# Patient Record
Sex: Female | Born: 1965 | Race: Black or African American | Hispanic: No | Smoking: Light tobacco smoker
Health system: Southern US, Community
[De-identification: ages and names within clinical notes are randomized; demographics above are authoritative.]

## PROBLEM LIST (undated history)

## (undated) ENCOUNTER — Emergency Department (HOSPITAL_COMMUNITY): Admission: EM | Payer: Medicaid Other | Source: Home / Self Care

## (undated) DIAGNOSIS — R51 Headache: Secondary | ICD-10-CM

## (undated) DIAGNOSIS — R87619 Unspecified abnormal cytological findings in specimens from cervix uteri: Secondary | ICD-10-CM

## (undated) DIAGNOSIS — M722 Plantar fascial fibromatosis: Secondary | ICD-10-CM

## (undated) DIAGNOSIS — C73 Malignant neoplasm of thyroid gland: Secondary | ICD-10-CM

## (undated) DIAGNOSIS — D219 Benign neoplasm of connective and other soft tissue, unspecified: Secondary | ICD-10-CM

## (undated) DIAGNOSIS — N390 Urinary tract infection, site not specified: Secondary | ICD-10-CM

## (undated) DIAGNOSIS — J40 Bronchitis, not specified as acute or chronic: Secondary | ICD-10-CM

## (undated) DIAGNOSIS — A599 Trichomoniasis, unspecified: Secondary | ICD-10-CM

## (undated) DIAGNOSIS — R569 Unspecified convulsions: Secondary | ICD-10-CM

## (undated) DIAGNOSIS — IMO0002 Reserved for concepts with insufficient information to code with codable children: Secondary | ICD-10-CM

## (undated) DIAGNOSIS — N83209 Unspecified ovarian cyst, unspecified side: Secondary | ICD-10-CM

## (undated) DIAGNOSIS — I639 Cerebral infarction, unspecified: Secondary | ICD-10-CM

## (undated) HISTORY — DX: Cerebral infarction, unspecified: I63.9

## (undated) HISTORY — PX: THERAPEUTIC ABORTION: SHX798

## (undated) HISTORY — PX: ABDOMINAL HYSTERECTOMY: SHX81

## (undated) HISTORY — PX: OTHER SURGICAL HISTORY: SHX169

---

## 1986-11-18 HISTORY — PX: OTHER SURGICAL HISTORY: SHX169

## 1998-06-03 ENCOUNTER — Emergency Department (HOSPITAL_COMMUNITY): Admission: EM | Admit: 1998-06-03 | Discharge: 1998-06-03 | Payer: Self-pay | Admitting: Emergency Medicine

## 2001-09-30 ENCOUNTER — Emergency Department (HOSPITAL_COMMUNITY): Admission: EM | Admit: 2001-09-30 | Discharge: 2001-09-30 | Payer: Self-pay | Admitting: *Deleted

## 2001-11-14 ENCOUNTER — Emergency Department (HOSPITAL_COMMUNITY): Admission: EM | Admit: 2001-11-14 | Discharge: 2001-11-14 | Payer: Self-pay | Admitting: Emergency Medicine

## 2002-01-09 ENCOUNTER — Encounter: Payer: Self-pay | Admitting: Emergency Medicine

## 2002-01-09 ENCOUNTER — Emergency Department (HOSPITAL_COMMUNITY): Admission: EM | Admit: 2002-01-09 | Discharge: 2002-01-09 | Payer: Self-pay | Admitting: Emergency Medicine

## 2002-04-25 ENCOUNTER — Emergency Department (HOSPITAL_COMMUNITY): Admission: EM | Admit: 2002-04-25 | Discharge: 2002-04-25 | Payer: Self-pay | Admitting: Emergency Medicine

## 2002-04-26 ENCOUNTER — Encounter: Payer: Self-pay | Admitting: Emergency Medicine

## 2002-04-26 ENCOUNTER — Emergency Department (HOSPITAL_COMMUNITY): Admission: EM | Admit: 2002-04-26 | Discharge: 2002-04-26 | Payer: Self-pay | Admitting: Emergency Medicine

## 2002-05-03 ENCOUNTER — Emergency Department (HOSPITAL_COMMUNITY): Admission: EM | Admit: 2002-05-03 | Discharge: 2002-05-03 | Payer: Self-pay | Admitting: Emergency Medicine

## 2002-05-03 ENCOUNTER — Encounter: Payer: Self-pay | Admitting: Emergency Medicine

## 2002-05-03 ENCOUNTER — Encounter: Payer: Self-pay | Admitting: Otolaryngology

## 2002-06-09 ENCOUNTER — Emergency Department (HOSPITAL_COMMUNITY): Admission: EM | Admit: 2002-06-09 | Discharge: 2002-06-10 | Payer: Self-pay

## 2002-07-13 ENCOUNTER — Emergency Department (HOSPITAL_COMMUNITY): Admission: EM | Admit: 2002-07-13 | Discharge: 2002-07-13 | Payer: Self-pay | Admitting: Emergency Medicine

## 2002-07-18 ENCOUNTER — Emergency Department (HOSPITAL_COMMUNITY): Admission: EM | Admit: 2002-07-18 | Discharge: 2002-07-18 | Payer: Self-pay | Admitting: Emergency Medicine

## 2002-07-20 ENCOUNTER — Inpatient Hospital Stay (HOSPITAL_COMMUNITY): Admission: EM | Admit: 2002-07-20 | Discharge: 2002-07-24 | Payer: Self-pay | Admitting: Psychiatry

## 2002-08-17 ENCOUNTER — Emergency Department (HOSPITAL_COMMUNITY): Admission: EM | Admit: 2002-08-17 | Discharge: 2002-08-17 | Payer: Self-pay | Admitting: Emergency Medicine

## 2002-09-25 ENCOUNTER — Emergency Department (HOSPITAL_COMMUNITY): Admission: EM | Admit: 2002-09-25 | Discharge: 2002-09-25 | Payer: Self-pay | Admitting: Emergency Medicine

## 2002-09-25 ENCOUNTER — Encounter: Payer: Self-pay | Admitting: Emergency Medicine

## 2002-11-23 ENCOUNTER — Emergency Department (HOSPITAL_COMMUNITY): Admission: EM | Admit: 2002-11-23 | Discharge: 2002-11-23 | Payer: Self-pay | Admitting: Emergency Medicine

## 2008-09-22 ENCOUNTER — Encounter: Payer: Self-pay | Admitting: Emergency Medicine

## 2008-09-23 ENCOUNTER — Observation Stay (HOSPITAL_COMMUNITY): Admission: AD | Admit: 2008-09-23 | Discharge: 2008-09-24 | Payer: Self-pay | Admitting: Obstetrics and Gynecology

## 2008-12-21 ENCOUNTER — Ambulatory Visit: Payer: Self-pay | Admitting: Obstetrics and Gynecology

## 2008-12-21 LAB — CONVERTED CEMR LAB
HCT: 41.8 % (ref 36.0–46.0)
Hemoglobin: 13.4 g/dL (ref 12.0–15.0)
MCV: 76 fL — ABNORMAL LOW (ref 78.0–100.0)
Platelets: 229 10*3/uL (ref 150–400)
WBC: 6.8 10*3/uL (ref 4.0–10.5)

## 2008-12-22 ENCOUNTER — Encounter: Payer: Self-pay | Admitting: Obstetrics and Gynecology

## 2008-12-22 LAB — CONVERTED CEMR LAB

## 2009-01-04 ENCOUNTER — Ambulatory Visit: Payer: Self-pay | Admitting: Obstetrics & Gynecology

## 2009-01-31 ENCOUNTER — Inpatient Hospital Stay (HOSPITAL_COMMUNITY): Admission: RE | Admit: 2009-01-31 | Discharge: 2009-02-03 | Payer: Self-pay | Admitting: Obstetrics & Gynecology

## 2009-01-31 ENCOUNTER — Ambulatory Visit: Payer: Self-pay | Admitting: Obstetrics & Gynecology

## 2009-01-31 ENCOUNTER — Encounter: Payer: Self-pay | Admitting: Obstetrics & Gynecology

## 2009-02-08 ENCOUNTER — Ambulatory Visit: Payer: Self-pay | Admitting: Obstetrics & Gynecology

## 2009-03-16 ENCOUNTER — Ambulatory Visit: Payer: Self-pay | Admitting: Obstetrics & Gynecology

## 2009-03-17 ENCOUNTER — Encounter: Payer: Self-pay | Admitting: Obstetrics and Gynecology

## 2009-03-17 LAB — CONVERTED CEMR LAB
Clue Cells Wet Prep HPF POC: NONE SEEN
Trich, Wet Prep: NONE SEEN
Yeast Wet Prep HPF POC: NONE SEEN

## 2009-04-26 ENCOUNTER — Emergency Department (HOSPITAL_COMMUNITY): Admission: EM | Admit: 2009-04-26 | Discharge: 2009-04-26 | Payer: Self-pay | Admitting: Emergency Medicine

## 2009-04-27 ENCOUNTER — Inpatient Hospital Stay (HOSPITAL_COMMUNITY): Admission: AD | Admit: 2009-04-27 | Discharge: 2009-04-27 | Payer: Self-pay | Admitting: Obstetrics & Gynecology

## 2009-06-20 ENCOUNTER — Emergency Department (HOSPITAL_COMMUNITY): Admission: EM | Admit: 2009-06-20 | Discharge: 2009-06-20 | Payer: Self-pay | Admitting: Emergency Medicine

## 2009-06-20 ENCOUNTER — Encounter: Admission: RE | Admit: 2009-06-20 | Discharge: 2009-06-20 | Payer: Self-pay | Admitting: General Surgery

## 2009-07-18 ENCOUNTER — Emergency Department (HOSPITAL_COMMUNITY): Admission: EM | Admit: 2009-07-18 | Discharge: 2009-07-18 | Payer: Self-pay | Admitting: Emergency Medicine

## 2009-08-03 ENCOUNTER — Emergency Department (HOSPITAL_COMMUNITY): Admission: EM | Admit: 2009-08-03 | Discharge: 2009-08-03 | Payer: Self-pay | Admitting: Family Medicine

## 2009-08-04 ENCOUNTER — Emergency Department (HOSPITAL_COMMUNITY): Admission: EM | Admit: 2009-08-04 | Discharge: 2009-08-04 | Payer: Self-pay | Admitting: Emergency Medicine

## 2009-08-09 ENCOUNTER — Ambulatory Visit: Payer: Self-pay | Admitting: Internal Medicine

## 2009-10-02 ENCOUNTER — Emergency Department (HOSPITAL_COMMUNITY): Admission: EM | Admit: 2009-10-02 | Discharge: 2009-10-02 | Payer: Self-pay | Admitting: Emergency Medicine

## 2009-10-10 ENCOUNTER — Ambulatory Visit: Payer: Self-pay | Admitting: Internal Medicine

## 2009-10-22 ENCOUNTER — Emergency Department (HOSPITAL_COMMUNITY): Admission: EM | Admit: 2009-10-22 | Discharge: 2009-10-22 | Payer: Self-pay | Admitting: Emergency Medicine

## 2009-11-15 ENCOUNTER — Encounter (HOSPITAL_COMMUNITY): Admission: RE | Admit: 2009-11-15 | Discharge: 2010-02-13 | Payer: Self-pay | Admitting: Endocrinology

## 2009-12-03 ENCOUNTER — Emergency Department (HOSPITAL_COMMUNITY): Admission: EM | Admit: 2009-12-03 | Discharge: 2009-12-03 | Payer: Self-pay | Admitting: Emergency Medicine

## 2009-12-21 ENCOUNTER — Emergency Department (HOSPITAL_COMMUNITY): Admission: EM | Admit: 2009-12-21 | Discharge: 2009-12-21 | Payer: Self-pay | Admitting: Emergency Medicine

## 2010-01-08 ENCOUNTER — Emergency Department (HOSPITAL_COMMUNITY): Admission: EM | Admit: 2010-01-08 | Discharge: 2010-01-08 | Payer: Self-pay | Admitting: Emergency Medicine

## 2010-01-16 HISTORY — PX: THYROIDECTOMY: SHX17

## 2010-01-27 ENCOUNTER — Inpatient Hospital Stay (HOSPITAL_COMMUNITY): Admission: RE | Admit: 2010-01-27 | Discharge: 2010-01-30 | Payer: Self-pay | Admitting: Psychiatry

## 2010-01-27 ENCOUNTER — Ambulatory Visit: Payer: Self-pay | Admitting: Psychiatry

## 2010-01-27 ENCOUNTER — Emergency Department (HOSPITAL_COMMUNITY): Admission: EM | Admit: 2010-01-27 | Discharge: 2010-01-27 | Payer: Self-pay | Admitting: Emergency Medicine

## 2010-01-31 ENCOUNTER — Encounter (INDEPENDENT_AMBULATORY_CARE_PROVIDER_SITE_OTHER): Payer: Self-pay | Admitting: General Surgery

## 2010-01-31 ENCOUNTER — Observation Stay (HOSPITAL_COMMUNITY): Admission: RE | Admit: 2010-01-31 | Discharge: 2010-02-05 | Payer: Self-pay | Admitting: General Surgery

## 2010-02-05 ENCOUNTER — Inpatient Hospital Stay (HOSPITAL_COMMUNITY): Admission: AD | Admit: 2010-02-05 | Discharge: 2010-02-11 | Payer: Self-pay | Admitting: Psychiatry

## 2010-04-29 ENCOUNTER — Emergency Department (HOSPITAL_COMMUNITY): Admission: EM | Admit: 2010-04-29 | Discharge: 2010-04-29 | Payer: Self-pay | Admitting: Emergency Medicine

## 2010-05-07 ENCOUNTER — Emergency Department (HOSPITAL_COMMUNITY): Admission: EM | Admit: 2010-05-07 | Discharge: 2010-05-07 | Payer: Self-pay | Admitting: Emergency Medicine

## 2010-05-15 ENCOUNTER — Ambulatory Visit: Payer: Self-pay | Admitting: Gynecology

## 2010-05-15 ENCOUNTER — Inpatient Hospital Stay (HOSPITAL_COMMUNITY): Admission: AD | Admit: 2010-05-15 | Discharge: 2010-05-15 | Payer: Self-pay | Admitting: Family Medicine

## 2010-06-14 ENCOUNTER — Inpatient Hospital Stay (HOSPITAL_COMMUNITY): Admission: EM | Admit: 2010-06-14 | Discharge: 2010-06-16 | Payer: Self-pay | Admitting: Emergency Medicine

## 2010-06-14 ENCOUNTER — Ambulatory Visit: Payer: Self-pay | Admitting: Cardiology

## 2010-06-16 ENCOUNTER — Encounter (INDEPENDENT_AMBULATORY_CARE_PROVIDER_SITE_OTHER): Payer: Self-pay | Admitting: Internal Medicine

## 2010-07-02 ENCOUNTER — Ambulatory Visit: Payer: Self-pay | Admitting: Internal Medicine

## 2010-08-06 ENCOUNTER — Emergency Department (HOSPITAL_COMMUNITY): Admission: EM | Admit: 2010-08-06 | Discharge: 2010-08-06 | Payer: Self-pay | Admitting: Emergency Medicine

## 2010-10-30 ENCOUNTER — Emergency Department (HOSPITAL_COMMUNITY)
Admission: EM | Admit: 2010-10-30 | Discharge: 2010-10-31 | Payer: Self-pay | Source: Home / Self Care | Admitting: Emergency Medicine

## 2010-12-09 ENCOUNTER — Encounter: Payer: Self-pay | Admitting: Endocrinology

## 2011-01-28 ENCOUNTER — Emergency Department (HOSPITAL_COMMUNITY)
Admission: EM | Admit: 2011-01-28 | Discharge: 2011-01-28 | Disposition: A | Discharge status: Home / Self Care | Payer: Self-pay | Attending: Emergency Medicine | Admitting: Emergency Medicine

## 2011-01-28 ENCOUNTER — Emergency Department (HOSPITAL_COMMUNITY): Payer: Self-pay

## 2011-01-28 DIAGNOSIS — W268XXA Contact with other sharp object(s), not elsewhere classified, initial encounter: Secondary | ICD-10-CM | POA: Insufficient documentation

## 2011-01-28 DIAGNOSIS — F329 Major depressive disorder, single episode, unspecified: Secondary | ICD-10-CM | POA: Insufficient documentation

## 2011-01-28 DIAGNOSIS — S61409A Unspecified open wound of unspecified hand, initial encounter: Secondary | ICD-10-CM | POA: Insufficient documentation

## 2011-01-28 DIAGNOSIS — E039 Hypothyroidism, unspecified: Secondary | ICD-10-CM | POA: Insufficient documentation

## 2011-01-28 DIAGNOSIS — S61209A Unspecified open wound of unspecified finger without damage to nail, initial encounter: Secondary | ICD-10-CM | POA: Insufficient documentation

## 2011-01-28 DIAGNOSIS — Z79899 Other long term (current) drug therapy: Secondary | ICD-10-CM | POA: Insufficient documentation

## 2011-01-28 DIAGNOSIS — I10 Essential (primary) hypertension: Secondary | ICD-10-CM | POA: Insufficient documentation

## 2011-01-28 DIAGNOSIS — F3289 Other specified depressive episodes: Secondary | ICD-10-CM | POA: Insufficient documentation

## 2011-01-28 LAB — DIFFERENTIAL
Basophils Absolute: 0 10*3/uL (ref 0.0–0.1)
Lymphocytes Relative: 33 % (ref 12–46)
Lymphs Abs: 1.8 10*3/uL (ref 0.7–4.0)
Monocytes Absolute: 0.4 10*3/uL (ref 0.1–1.0)
Monocytes Relative: 7 % (ref 3–12)
Neutro Abs: 3.3 10*3/uL (ref 1.7–7.7)

## 2011-01-28 LAB — URINALYSIS, ROUTINE W REFLEX MICROSCOPIC
Protein, ur: NEGATIVE mg/dL
Urobilinogen, UA: 1 mg/dL (ref 0.0–1.0)

## 2011-01-28 LAB — CBC
MCH: 28 pg (ref 26.0–34.0)
MCHC: 33 g/dL (ref 30.0–36.0)
MCV: 84.7 fL (ref 78.0–100.0)
Platelets: 157 10*3/uL (ref 150–400)
RDW: 12.5 % (ref 11.5–15.5)
WBC: 5.6 10*3/uL (ref 4.0–10.5)

## 2011-01-28 LAB — URINE MICROSCOPIC-ADD ON

## 2011-01-28 LAB — COMPREHENSIVE METABOLIC PANEL
Albumin: 3.7 g/dL (ref 3.5–5.2)
BUN: 9 mg/dL (ref 6–23)
Calcium: 8.4 mg/dL (ref 8.4–10.5)
Creatinine, Ser: 0.61 mg/dL (ref 0.4–1.2)
Total Protein: 6.7 g/dL (ref 6.0–8.3)

## 2011-01-28 LAB — RAPID URINE DRUG SCREEN, HOSP PERFORMED
Amphetamines: NOT DETECTED
Barbiturates: NOT DETECTED
Benzodiazepines: NOT DETECTED
Opiates: NOT DETECTED

## 2011-01-28 LAB — TROPONIN I: Troponin I: <0.01 ng/mL (ref 0.00–0.06)

## 2011-01-28 LAB — APTT: aPTT: 28 seconds (ref 24–37)

## 2011-02-02 LAB — CBC
HCT: 45.2 % (ref 36.0–46.0)
HCT: 45.3 % (ref 36.0–46.0)
Hemoglobin: 15.5 g/dL — ABNORMAL HIGH (ref 12.0–15.0)
MCH: 29.2 pg (ref 26.0–34.0)
MCH: 29.7 pg (ref 26.0–34.0)
MCHC: 33.4 g/dL (ref 30.0–36.0)
MCHC: 34.3 g/dL (ref 30.0–36.0)
MCV: 86.7 fL (ref 78.0–100.0)
Platelets: 144 10*3/uL — ABNORMAL LOW (ref 150–400)
RBC: 5.21 MIL/uL — ABNORMAL HIGH (ref 3.87–5.11)
RDW: 13.2 % (ref 11.5–15.5)
RDW: 13.6 % (ref 11.5–15.5)
WBC: 4.6 K/uL (ref 4.0–10.5)

## 2011-02-02 LAB — URINALYSIS, ROUTINE W REFLEX MICROSCOPIC
Bilirubin Urine: NEGATIVE
Glucose, UA: NEGATIVE mg/dL
Hgb urine dipstick: NEGATIVE
Ketones, ur: NEGATIVE mg/dL
Nitrite: NEGATIVE
Protein, ur: NEGATIVE mg/dL
Specific Gravity, Urine: 1.01 (ref 1.005–1.030)
Urobilinogen, UA: 0.2 mg/dL (ref 0.0–1.0)
pH: 6 (ref 5.0–8.0)

## 2011-02-02 LAB — BASIC METABOLIC PANEL
BUN: 7 mg/dL (ref 6–23)
Calcium: 8.2 mg/dL — ABNORMAL LOW (ref 8.4–10.5)
Calcium: 8.6 mg/dL (ref 8.4–10.5)
Creatinine, Ser: 0.68 mg/dL (ref 0.4–1.2)
GFR calc Af Amer: >60 mL/min (ref >60)
GFR calc non Af Amer: >60 mL/min (ref >60)
GFR calc non Af Amer: >60 mL/min (ref >60)
Glucose, Bld: 84 mg/dL (ref 70–99)
Potassium: 3.9 mEq/L (ref 3.5–5.1)
Sodium: 139 mEq/L (ref 135–145)
Sodium: 139 mEq/L (ref 135–145)

## 2011-02-02 LAB — LIPID PANEL
Cholesterol: 181 mg/dL (ref 0–200)
HDL: 40 mg/dL (ref >39)
Total CHOL/HDL Ratio: 4.5 RATIO
VLDL: 22 mg/dL (ref 0–40)

## 2011-02-02 LAB — COMPREHENSIVE METABOLIC PANEL
Albumin: 3.7 g/dL (ref 3.5–5.2)
BUN: 6 mg/dL (ref 6–23)
Chloride: 105 mEq/L (ref 96–112)
Creatinine, Ser: 0.59 mg/dL (ref 0.4–1.2)
Glucose, Bld: 87 mg/dL (ref 70–99)
Total Bilirubin: 0.6 mg/dL (ref 0.3–1.2)

## 2011-02-02 LAB — BASIC METABOLIC PANEL WITH GFR
CO2: 25 meq/L (ref 19–32)
Chloride: 108 meq/L (ref 96–112)
GFR calc Af Amer: >60 mL/min (ref >60)
Potassium: 3.6 meq/L (ref 3.5–5.1)

## 2011-02-02 LAB — T4, FREE: Free T4: 1.35 ng/dL (ref 0.80–1.80)

## 2011-02-02 LAB — PROTIME-INR
INR: 0.96 (ref 0.00–1.49)
Prothrombin Time: 12.7 seconds (ref 11.6–15.2)

## 2011-02-02 LAB — RAPID URINE DRUG SCREEN, HOSP PERFORMED
Amphetamines: NOT DETECTED
Barbiturates: NOT DETECTED

## 2011-02-02 LAB — DIFFERENTIAL
Basophils Absolute: 0 10*3/uL (ref 0.0–0.1)
Basophils Relative: 0 % (ref 0–1)
Eosinophils Absolute: 0.1 K/uL (ref 0.0–0.7)
Eosinophils Relative: 2 % (ref 0–5)
Lymphocytes Relative: 37 % (ref 12–46)
Lymphs Abs: 1.7 K/uL (ref 0.7–4.0)
Monocytes Absolute: 0.4 K/uL (ref 0.1–1.0)
Monocytes Relative: 8 % (ref 3–12)
Neutro Abs: 2.4 10*3/uL (ref 1.7–7.7)
Neutrophils Relative %: 54 % (ref 43–77)

## 2011-02-02 LAB — TSH
TSH: 0.584 u[IU]/mL (ref 0.350–4.500)
TSH: 1.015 u[IU]/mL (ref 0.350–4.500)

## 2011-02-02 LAB — HEMOGLOBIN A1C
Hgb A1c MFr Bld: 5.6 % (ref <5.7)
Mean Plasma Glucose: 114 mg/dL (ref <117)

## 2011-02-02 LAB — ETHANOL: Alcohol, Ethyl (B): <5 mg/dL (ref 0–10)

## 2011-02-02 LAB — CK TOTAL AND CKMB (NOT AT ARMC)
CK, MB: 0.9 ng/mL (ref 0.3–4.0)
Total CK: 87 U/L (ref 7–177)

## 2011-02-03 LAB — WET PREP, GENITAL
Trich, Wet Prep: NONE SEEN
Yeast Wet Prep HPF POC: NONE SEEN

## 2011-02-03 LAB — URINALYSIS, ROUTINE W REFLEX MICROSCOPIC
Bilirubin Urine: NEGATIVE
Glucose, UA: NEGATIVE mg/dL
Ketones, ur: NEGATIVE mg/dL
Protein, ur: NEGATIVE mg/dL

## 2011-02-04 LAB — URINALYSIS, ROUTINE W REFLEX MICROSCOPIC
Bilirubin Urine: NEGATIVE
Ketones, ur: NEGATIVE mg/dL
Nitrite: NEGATIVE
Protein, ur: NEGATIVE mg/dL
Urobilinogen, UA: 0.2 mg/dL (ref 0.0–1.0)
pH: 6.5 (ref 5.0–8.0)

## 2011-02-04 LAB — POCT PREGNANCY, URINE: Preg Test, Ur: NEGATIVE

## 2011-02-05 ENCOUNTER — Inpatient Hospital Stay (HOSPITAL_COMMUNITY)
Admission: AD | Admit: 2011-02-05 | Discharge: 2011-02-05 | Disposition: A | Discharge status: Home / Self Care | Payer: Self-pay | Source: Ambulatory Visit | Attending: Obstetrics and Gynecology | Admitting: Obstetrics and Gynecology

## 2011-02-05 DIAGNOSIS — M549 Dorsalgia, unspecified: Secondary | ICD-10-CM | POA: Insufficient documentation

## 2011-02-05 DIAGNOSIS — A5901 Trichomonal vulvovaginitis: Secondary | ICD-10-CM | POA: Insufficient documentation

## 2011-02-05 DIAGNOSIS — N644 Mastodynia: Secondary | ICD-10-CM | POA: Insufficient documentation

## 2011-02-05 LAB — URINALYSIS, ROUTINE W REFLEX MICROSCOPIC
Bilirubin Urine: NEGATIVE
Ketones, ur: NEGATIVE mg/dL
Nitrite: NEGATIVE
Protein, ur: NEGATIVE mg/dL
Urobilinogen, UA: 0.2 mg/dL (ref 0.0–1.0)

## 2011-02-05 LAB — URINE MICROSCOPIC-ADD ON

## 2011-02-10 LAB — HEMOCCULT GUIAC POC 1CARD (OFFICE): Fecal Occult Bld: POSITIVE

## 2011-02-10 LAB — CALCIUM: Calcium: 7.6 mg/dL — ABNORMAL LOW (ref 8.4–10.5)

## 2011-02-11 ENCOUNTER — Other Ambulatory Visit: Payer: Self-pay

## 2011-02-11 LAB — CBC
HCT: 46.8 % — ABNORMAL HIGH (ref 36.0–46.0)
Hemoglobin: 15.1 g/dL — ABNORMAL HIGH (ref 12.0–15.0)
MCHC: 32.4 g/dL (ref 30.0–36.0)
MCV: 89 fL (ref 78.0–100.0)
Platelets: 182 10*3/uL (ref 150–400)
RDW: 13.9 % (ref 11.5–15.5)
RDW: 14.1 % (ref 11.5–15.5)
WBC: 5.4 10*3/uL (ref 4.0–10.5)

## 2011-02-11 LAB — RAPID URINE DRUG SCREEN, HOSP PERFORMED
Amphetamines: NOT DETECTED
Barbiturates: NOT DETECTED
Benzodiazepines: NOT DETECTED
Tetrahydrocannabinol: NOT DETECTED

## 2011-02-11 LAB — DIFFERENTIAL
Basophils Absolute: 0 10*3/uL (ref 0.0–0.1)
Basophils Relative: 0 % (ref 0–1)
Eosinophils Absolute: 0 10*3/uL (ref 0.0–0.7)
Eosinophils Relative: 0 % (ref 0–5)
Lymphocytes Relative: 17 % (ref 12–46)
Lymphs Abs: 1.3 10*3/uL (ref 0.7–4.0)
Monocytes Absolute: 0.3 10*3/uL (ref 0.1–1.0)
Monocytes Absolute: 0.5 10*3/uL (ref 0.1–1.0)
Monocytes Relative: 6 % (ref 3–12)
Monocytes Relative: 6 % (ref 3–12)
Neutrophils Relative %: 78 % — ABNORMAL HIGH (ref 43–77)

## 2011-02-11 LAB — COMPREHENSIVE METABOLIC PANEL
ALT: 12 U/L (ref 0–35)
AST: 14 U/L (ref 0–37)
Albumin: 3.6 g/dL (ref 3.5–5.2)
Albumin: 4 g/dL (ref 3.5–5.2)
Alkaline Phosphatase: 52 U/L (ref 39–117)
BUN: 8 mg/dL (ref 6–23)
Calcium: 9 mg/dL (ref 8.4–10.5)
Chloride: 109 mEq/L (ref 96–112)
Creatinine, Ser: 0.7 mg/dL (ref 0.4–1.2)
GFR calc Af Amer: >60 mL/min (ref >60)
GFR calc non Af Amer: >60 mL/min (ref >60)
Glucose, Bld: 103 mg/dL — ABNORMAL HIGH (ref 70–99)
Potassium: 3.7 mEq/L (ref 3.5–5.1)
Sodium: 138 mEq/L (ref 135–145)
Sodium: 139 mEq/L (ref 135–145)
Total Bilirubin: 0.4 mg/dL (ref 0.3–1.2)
Total Protein: 6.5 g/dL (ref 6.0–8.3)
Total Protein: 7 g/dL (ref 6.0–8.3)

## 2011-02-11 LAB — CALCIUM
Calcium: 6.4 mg/dL — CL (ref 8.4–10.5)
Calcium: 6.6 mg/dL — ABNORMAL LOW (ref 8.4–10.5)
Calcium: 6.7 mg/dL — ABNORMAL LOW (ref 8.4–10.5)
Calcium: 8.2 mg/dL — ABNORMAL LOW (ref 8.4–10.5)

## 2011-02-19 LAB — DIFFERENTIAL
Basophils Absolute: 0 10*3/uL (ref 0.0–0.1)
Basophils Relative: 0 % (ref 0–1)
Eosinophils Absolute: 0 10*3/uL (ref 0.0–0.7)
Eosinophils Relative: 0 % (ref 0–5)

## 2011-02-19 LAB — RAPID URINE DRUG SCREEN, HOSP PERFORMED
Barbiturates: NOT DETECTED
Benzodiazepines: NOT DETECTED
Cocaine: POSITIVE — AB

## 2011-02-19 LAB — CBC
HCT: 45.8 % (ref 36.0–46.0)
MCHC: 32.8 g/dL (ref 30.0–36.0)
MCV: 84.3 fL (ref 78.0–100.0)
Platelets: 211 10*3/uL (ref 150–400)
RDW: 16.8 % — ABNORMAL HIGH (ref 11.5–15.5)

## 2011-02-19 LAB — URINALYSIS, ROUTINE W REFLEX MICROSCOPIC
Glucose, UA: NEGATIVE mg/dL
Hgb urine dipstick: NEGATIVE
Ketones, ur: 40 mg/dL — AB
Protein, ur: 30 mg/dL — AB
Urobilinogen, UA: 4 mg/dL — ABNORMAL HIGH (ref 0.0–1.0)

## 2011-02-19 LAB — URINE MICROSCOPIC-ADD ON

## 2011-02-21 ENCOUNTER — Encounter: Payer: Self-pay | Admitting: Advanced Practice Midwife

## 2011-02-23 LAB — URINALYSIS, ROUTINE W REFLEX MICROSCOPIC
Glucose, UA: NEGATIVE mg/dL
Ketones, ur: NEGATIVE mg/dL
Nitrite: NEGATIVE
Protein, ur: NEGATIVE mg/dL

## 2011-02-25 LAB — WET PREP, GENITAL: Trich, Wet Prep: NONE SEEN

## 2011-02-25 LAB — URINALYSIS, ROUTINE W REFLEX MICROSCOPIC
Bilirubin Urine: NEGATIVE
Ketones, ur: NEGATIVE mg/dL
Nitrite: NEGATIVE
Specific Gravity, Urine: 1.015 (ref 1.005–1.030)
Urobilinogen, UA: 0.2 mg/dL (ref 0.0–1.0)

## 2011-02-25 LAB — CBC
HCT: 36.5 % (ref 36.0–46.0)
Platelets: 178 10*3/uL (ref 150–400)
RDW: 22.4 % — ABNORMAL HIGH (ref 11.5–15.5)

## 2011-02-27 LAB — POCT URINALYSIS DIP (DEVICE)
Bilirubin Urine: NEGATIVE
Hgb urine dipstick: NEGATIVE
Ketones, ur: NEGATIVE mg/dL
Specific Gravity, Urine: 1.02 (ref 1.005–1.030)
pH: 5.5 (ref 5.0–8.0)

## 2011-02-28 LAB — CBC
HCT: 32.8 % — ABNORMAL LOW (ref 36.0–46.0)
Hemoglobin: 10.1 g/dL — ABNORMAL LOW (ref 12.0–15.0)
Hemoglobin: 10.6 g/dL — ABNORMAL LOW (ref 12.0–15.0)
MCHC: 32.1 g/dL (ref 30.0–36.0)
MCHC: 32.2 g/dL (ref 30.0–36.0)
MCV: 74.3 fL — ABNORMAL LOW (ref 78.0–100.0)
MCV: 74.6 fL — ABNORMAL LOW (ref 78.0–100.0)
RBC: 4.22 MIL/uL (ref 3.87–5.11)
RDW: 17.6 % — ABNORMAL HIGH (ref 11.5–15.5)

## 2011-02-28 LAB — POCT URINALYSIS DIP (DEVICE)
Hgb urine dipstick: NEGATIVE
Nitrite: NEGATIVE
Specific Gravity, Urine: 1.015 (ref 1.005–1.030)
Urobilinogen, UA: 2 mg/dL — ABNORMAL HIGH (ref 0.0–1.0)
pH: 7.5 (ref 5.0–8.0)

## 2011-03-25 ENCOUNTER — Encounter: Payer: Self-pay | Admitting: Advanced Practice Midwife

## 2011-04-02 NOTE — Group Therapy Note (Signed)
NAME:  Theresa Watkins, Theresa Watkins NO.:  [id number]   MEDICAL RECORD NO.:  [id number]          PATIENT TYPE:  WOC   LOCATION:  [facility]                   FACILITY:  [facility]   PHYSICIAN:  Scheryl Darter, MD       DATE OF BIRTH:  Jun 14, 1966   DATE OF SERVICE:                                  CLINIC NOTE   CHIEF COMPLAINT:  Heavy periods.   DIAGNOSIS:  Fibroid uterus.   Patient is a 45 year old black female, gravida 3, para 1, abortus 2 with  a history of heavy periods for the last 7 days and an emergency room  visit in November, where she had low hemoglobin, and she was transfused.  She was sent from [facility] Emergency Room  to [facility] and was  seen by Dr. Stefano Gaul in the MAU.  At that time, her hemoglobin was 5.5,  her hematocrit was 19.2, platelets 130,000.  Last period was January  20th, lasted 7 days, and was heavy.  She had some low back pain,  increasing problems with urge incontinence of urine, and constipation.   PAST MEDICAL HISTORY:  Large goiter.  Previously treated with Synthroid.   PAST SURGICAL HISTORY:  1. Laparotomy for a cornual ectopic pregnancy.  2. Cesarean section.   MEDICATIONS:  1. Iron tablet once daily.  2. Calcium 2 tablets daily.  3. Vitamin C 500 mg daily.   She has a sensitivity to ASPIRIN and GRAPES.   SOCIAL HISTORY:  She is single and smokes a few cigarettes a day.  She  has a previous history of drug abuse.   PHYSICAL EXAMINATION:  Patient is in no acute distress.  Pleasant.  Height 64 inches.  Weight 132.8 pounds.  BP 138/90.  Pulse 101.  Temperature 97.  Patient has a large goiter, greater on the left side, which she says is  longstanding.  ABDOMEN:  Nontender with a mass that is firm and palpable to about 2  fingerbreadths above the umbilicus, consistent with a large fibroid.  PELVIC:  External genitalia, vagina, and cervix showed a whitish  discharge with slight odor.  The uterus is irregular, somewhat deviated  to the  right.  About 16-18 weeks in size.   Patient had a CT scan that showed a moderately enlarged uterus, 14 cm x  11 cm x 9.9 cm, consistent with several fibroids.  There is no comment  on the adnexa.   IMPRESSION:  1. Menorrhagia, history of severe anemia, and large fibroid uterus.  2. History of goiter, currently untreated.   PLAN:  Patient will have CBC and thyroid panel.  She also relates about  a month ago having bilateral nipple discharge and ordered a prolactin  level.  She will return in about a week or 2 to review her results and  to be lectured on hysterectomy, which I offered due to her large fibroid  uterus and history of anemia.      Scheryl Darter, MD     JA/MEDQ  D:  12/21/2008  T:  12/21/2008  Job:  [id number]

## 2011-04-02 NOTE — Group Therapy Note (Signed)
Theresa Watkins, GARLITZ NO.:  [id number]   MEDICAL RECORD NO.:  [id number]          PATIENT TYPE:  WOC   LOCATION:  [facility]                   FACILITY:  [facility]   PHYSICIAN:  Johnella Moloney, MD        DATE OF BIRTH:  October 19, 1966   DATE OF SERVICE:                                  CLINIC NOTE   REASON FOR VISIT:  Incision check.   HISTORY:  This is a 45 year old who is post TAH for large fibroids.  Her  procedure was uncomplicated on March 16, with Scheryl Darter, MD.  Since  hospital discharge she has had low grade temps at home 99.2-100.3 and  she is having incisional pain.  However, she has completely stopped all  narcotics and is not a been taking ibuprofen or Tylenol due to having a  severe depressive episode where she felt suicidal and so saw mental  health and attributed this to the Tylox.  Her depression has pretty much  resolved and she has a good support system.  She has had no drainage  from the wound, does have some vaginal discharge and does have some  urinary urgency and frequency.   PHYSICAL EXAMINATION:  NAD.  Temperature 100.2, pulse 97, BP 129/85, weight 130.7 pounds.  ABDOMEN:  Incision clean, well healed and moderately tender with no  localized incisional tenderness.  The inferior aspect has slight skin  separation which is very superficial, about 1 cm and this is where there  is overlying tissue noted as staples are removed.  PELVIC EXAM:  Very scant thin discharge.  No malodor.   Urinalysis is pending at time of dictation.   ASSESSMENT:  1. Possible subclinical wound infection.  2. Possible urinary tract infection.  3. Depression/adverse reaction to narcotic.   PLAN:  We will treat with Keflex 500 q.i.d. for 10 days and see her back  in a couple of weeks.  Steri-Strips were applied after staple removal  and she is advised to keep everything clean and no strenuous activity.     ______________________________  Caren Griffins, CNM    ______________________________  Johnella Moloney, MD    DP/MEDQ  D:  02/08/2009  T:  02/08/2009  Job:  [id number]

## 2011-04-02 NOTE — Discharge Summary (Signed)
NAMESHANTESE, RAVEN NO.:  [id number]   MEDICAL RECORD NO.:  [id number]          PATIENT TYPE:  INP   LOCATION:  9315                          [facility]   PHYSICIAN:  Scheryl Darter, MD       DATE OF BIRTH:  September 24, 1966   DATE OF ADMISSION:  01/31/2009  DATE OF DISCHARGE:  02/03/2009                               DISCHARGE SUMMARY   DIAGNOSIS:  Symptomatic fibroid uterus.   PROCEDURE:  On January 31, 2009, total abdominal hysterectomy.   The patient is a 45 year old white female gravida 3, para 1, abortus 2  with history of abdominal pain and heavy periods who was admitted for  total abdominal hysterectomy due to symptomatic fibroid uterus.  In  November, she was admitted with anemia with hemoglobin of 5.5, and she  received 4 units of packed red blood cells.  She was transferred to  Gynecologic Clinic at [facility] for care.  CT scan showed a  moderately large uterus 14 cm x 11 cm x 9.9 cm consistent with several  fibroids.   PAST MEDICAL HISTORY:  Large goiter.  Previous history of Synthroid.   PAST SURGICAL HISTORY:  Laparotomy for cornual ectopic pregnancy on the  right and a cesarean section.   MEDICATIONS:  Iron tablet, vitamin C, and calcium.   Sensitivities to aspirin and to grapes.   SOCIAL HISTORY:  The patient is single.  She smokes 2 cigarettes a day.  Has previous history of cocaine use.   PHYSICAL EXAMINATION:  GENERAL:  No acute distress.  VITAL SIGNS:  Weight 132.8 pounds, BP 138/90, and pulse 90.  The patient  has large goiter greater on the left side.  CHEST:  Clear.  HEART:  Regular rate and rhythm.  ABDOMEN:  Nontender without mass that is firm and palpable two  fingerbreadths above the umbilicus consistent with large fibroid.  PELVIC: Concern for the mass.   The patient was admitted on January 31, 2009 and total abdominal  hysterectomy was performed with a workup of midline incision.  Procedure  was performed without  complications.  Postoperative hemoglobin is 10.1.  She had somewhat slow return of bowel function, but was eating on third  postoperative day.  She was discharged home.  She  was given prescription for Percocet 5/325 one to two p.o. q.4-6 h.  p.r.n. pain 20 tablets, no refills.  She was instructed to have her  staples removed at the MAU in 3 days after discharge.  She is to follow  up with Gynecology Clinic in 4 weeks.      Scheryl Darter, MD  Electronically Signed     JA/MEDQ  D:  02/03/2009  T:  02/04/2009  Job:  [id number]

## 2011-04-02 NOTE — Op Note (Signed)
NAMEERRYN, Theresa Watkins NO.:  [id number]   MEDICAL RECORD NO.:  [id number]          PATIENT TYPE:  INP   LOCATION:  9315                          [facility]   PHYSICIAN:  Scheryl Darter, MD       DATE OF BIRTH:  1966/10/23   DATE OF PROCEDURE:  DATE OF DISCHARGE:                               OPERATIVE REPORT   PROCEDURE:  Total abdominal hysterectomy.   PREOPERATIVE DIAGNOSIS:  Symptomatic fibroid uterus.   POSTOPERATIVE DIAGNOSIS:  Fibroid uterus.   SURGEON:  Scheryl Darter, MD   ASSISTANTS:  Javier Glazier. Okey Dupre, MD and Odie Sera, DO.   ANESTHESIA:  General.   ESTIMATED BLOOD LOSS:  300 mL.   SPECIMEN:  Uterus.   TOTAL IV FLUIDS:  1500 mL.   URINE OUTPUT.:  100 mL.   COMPLICATIONS:  None.   DRAINS:  Foley catheter.   COUNTS:  Correct.   OPERATIVE REPORT:  The patient was given written consent for total  abdominal hysterectomy due to large symptomatic fibroid uterus.  The  patient's identification was confirmed.  She was brought to the OR and  general anesthesia was induced.  She was placed in dorsal supine  position.  Exam revealed a large mobile smooth pelvic mass up the  umbilicus.  Abdomen, perineum, and vagina was sterilely prepped and  draped.  A Foley catheter was placed.  A #10 blade was used to make a  vertical midline skin incision from the umbilicus to symphysis pubis.  This was carried down to the fascia and the fascia was incised.  Incision was extended vertically with curved Mayo scissors.  The  incision was carried about centimeter and half above the umbilicus.  The  peritoneal cavity was entered and the incision was extended vertically  with Metzenbaum scissors.  The large pelvic mass was fibroid uterus.  This was elevated through the incision, good access to adnexa was  obtained.  The patient had previous right ectopic pregnancy and there  was evidence of a partial right salpingectomy.  Both ovaries appeared  normal.  The adnexa  were cross-clamped with Kelly clamps distal to the  ovaries.  Next, all pedicles were cut with scissors and suture ligated.  The right round ligament was clamped, cut, and suture ligated and the  anterior leaf of broad ligament was opened and bladder flap was created  by incising crossways from the anterior surface of the uterus.  The  uterine vessels were exposed and clamped with Heaney clamps and cut and  suture ligated.  Good hemostasis seen.  Bladder was dissected off the  anterior uterus and cervix.  The cardinal ligaments were clamped, cut,  and suture ligated.  The uterosacral ligaments were likewise cut and  ligated.  The vagina was entered and scissors were used to excise the  cervix from the vagina.  Vaginal cuff was closed with a running locking  suture with 0 Vicryl.  Hemostasis was assured with interrupted 0 Vicryl  suture.  All instrument and packs were removed.  The pelvis was  irrigated.  Anterior peritoneum was closed with running suture of  2-0  Vicryl.  Fascia was closed with running suture with 0 Vicryl.  Skin  incision was irrigated.  Good hemostasis was seen.  The skin was closed  with skin staples.  Sterile dressing was applied.  The patient tolerated  the procedure well without complication.   ESTIMATED BLOOD LOSS:  300 mL.  She was transported in stable condition  to the recovery room.      Scheryl Darter, MD  Electronically Signed     JA/MEDQ  D:  01/31/2009  T:  02/01/2009  Job:  [id number]

## 2011-04-02 NOTE — Group Therapy Note (Signed)
NAME:  Theresa Watkins, Theresa Watkins NO.:  [id number]   MEDICAL RECORD NO.:  [id number]          PATIENT TYPE:  WOC   LOCATION:  [facility]                   [facility]   PHYSICIAN:  Allie Bossier, MD        DATE OF BIRTH:  [birth year]   DATE OF SERVICE:                                  CLINIC NOTE   [name] is a 45 year old who is 6 weeks postop status post TAH for  large fibroids.  This uncomplicated procedure was done on March 16 by  Dr. Scheryl Darter.  She comes today with several complaints.  She does  say that she is having a white vaginal discharge that at some point had  a bad smell.  She also complains of some umbilical numbness but  associated also with occasional pain and a smell.  She has used alcohol  on her umbilicus occasionally with relief.  She also continues to  complain of urinary frequency and burning.  Please note that she also  had this urinary frequency and burning at her last visit when Dr. Silas Flood  saw her on February 08, 2009.  Dr. Silas Flood treated her with Keflex 500 mg  q.i.d. for 10 days.  On her urinalysis today, it is completely normal.  Her incision has healed beautifully.  There is no umbilical discharge.  There is no vaginal odor and a wet prep was performed.  Normal discharge  was seen.  The cuff has healed well.  Please note she has resumed sexual  function and is orgasmic with no complaints.  She will come back here as  necessary and in the meantime I will send her to a urologist to evaluate  this 65-month history of urinary symptoms.      Allie Bossier, MD     MCD/MEDQ  D:  03/16/2009  T:  03/16/2009  Job:  [id number]

## 2011-04-02 NOTE — Group Therapy Note (Signed)
NAME:  Theresa Watkins, Theresa Watkins NO.:  [id number]   MEDICAL RECORD NO.:  [id number]          PATIENT TYPE:  WOC   LOCATION:  [facility]                   [facility]   PHYSICIAN:  Johnella Moloney, MD        DATE OF BIRTH:  07-10-1966   DATE OF SERVICE:                                  CLINIC NOTE   The patient is a 45 year old gravida 3, para 1 who was last seen by Dr.  Scheryl Darter on December 21, 2008, for evaluation of her symptomatic  fibroid uterus.  At that visit, the patient underwent several laboratory  studies and is here today for followup of those results.  She is also  here today for surgical planning.  According to the patient, she said  that her hysterectomy will be done by Dr. Adine Madura and that Dr. Debroah Loop  wanted to have this surgery done as soon as possible.   PHYSICAL EXAMINATION:  Deferred.   As for her results, patient had thyroid profile that was within normal  limits.  She had a hemoglobin of 13.4, hematocrit of 41.8 and a  prolactin of 5.6.  Of note, her prolactin level was checked because the  patient came in with complaint of bilateral nipple discharge.  These  results were reviewed with the patient who was reassured.   The surgical order sheet was filled out for her with Dr. Debroah Loop as the  primary surgeon as per the patient's request for an abdominal  hysterectomy.  Risks of surgery including bleeding which may require  transfusion, infection which may require antibiotics, injury to  surrounding organs including intestines, bladder, ureters was discussed  with the patient with an increased risk of needing additional procedures  if injury does occur.  The patient also desires not to have an  oophorectomy does understand that at the time of surgery if her ovaries  are abnormal or any other abnormal tissue is discovered that this will  be removed at the time of surgery.  All her questions were answered.  She was also given care notes information  about the pre-care, inpatient  care and discharge care about abdominal hysterectomy and was told to  expect a call from surgical scheduler regarding the time and date of her  surgery.           ______________________________  Johnella Moloney, MD     UD/MEDQ  D:  01/04/2009  T:  01/04/2009  Job:  [id number]

## 2011-04-05 NOTE — H&P (Signed)
[name], [name]:  [id number]   MEDICAL RECORD NO.:  [id number]                   PATIENT TYPE:  IPS   LOCATION:  0405                                 [facility]ILITY[facility]   PHYSICIAN:  Geoffery Lyons, MD                     DATE OF BIRTH:  1966-04-12   DATE OF ADMISSION:  07/20/2002  DATE OF DISCHARGE:                         PSYCHIATRIC ADMISSION ASSESSMENT   IDENTIFYING INFORMATION:  A 45 year old separated black female, admitted  voluntarily on July 20, 2002.   HISTORY OF PRESENT ILLNESS:  The patient presents with a history of  psychosis, experiencing positive visual hallucinations after her surgery of  3 days ago for a rectal abscess.  She was experiencing visual hallucinations  on July 20, 2002, seeing a girl in a red shirt and a boy in a blue  shirt.  She denies auditory hallucinations.  She has no prior history of  hallucinations.  She reports she has a history of a fever as well of 101.3  in the emergency department.  The patient feels that she has had these  visions due to the medication that she received while she had her surgery.  No prior history of psychosis.  Denies any suicidal or homicidal ideation.  She reports some recent cocaine use that she states her friends had given  her to relieve pain after her surgery.  She states her sleep and appetite  has been fair.  She denies any current suicidal ideation, depression or  psychosis.   PAST PSYCHIATRIC HISTORY:  Second hospitalization at [facility].  Last visit was 2 years ago.  She was unsure why she was  hospitalized then.  No history of a suicide attempt.   SOCIAL HISTORY:  She is a 45 year old separated black female, 1 child age 58  years that is presently with her mother.  She states she is homeless.  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  She has completed high school and college.  She has a court date  pending for larceny.   FAMILY HISTORY:  Unknown.   ALCOHOL DRUG HISTORY:  The patient smokes, denies any alcohol use, denies  any substance abuse.  States she had some cocaine 2 days ago, was snorting  cocaine.   PAST MEDICAL HISTORY:  Primary care Kjell Brannen is none.  Medical problems are  a cyst 3 days ago that was drained.  She has a history of hypothyroidism  presently on no medication.   MEDICATIONS:  None.   DRUG ALLERGIES:  ASPIRIN.   PHYSICAL EXAMINATION:  Was at [facility] Emergency Department.  CBC:  Hemoglobin of 9, hematocrit of 31, MCV was 66.8, RDW was 19.  Alcohol level  was less than 5.  CMET within normal limits.  Urine pregnancy test was  negative.  Urine drug screen was positive  for cocaine.  Urinalysis was  negative.  The patient has some minimal serosanguineous drainage from her  surgery.   MENTAL STATUS EXAM:  She is a drowsy, thin African-American female, dressed  in a gown, cooperative with fair contact.  Speech is soft spoken.  Mood is  depressed and she is blunt.  Thought processes are coherent.  There is no  evidence of psychosis, no auditory or visual hallucinations, no suicidal or  homicidal ideation, no paranoia.  Cognition function intact.  Memory is  fair, judgment and insight are fair.   ADMISSION DIAGNOSES:   AXIS I:  Psychosis not otherwise specified.   AXIS II:  Deferred.   AXIS III:  Status post incision and drainage of a rectal abscess.   AXIS IV:  Problems with housing, other psychosocial problems.   AXIS V:  Current is 30, this past year 73.   PLAN:  A voluntary admission for psychosis.  Contract for safety, check  every 15 minutes.  The patient will be placed on the 400 hall for close  monitoring.  We will do wound care.  Zyprexa will be ordered for  hallucinations.  We will check her labs in regards to anemia and consult  medicine in regards to that.  The patient to follow up with mental health  and her surgeon for postop care.   To stabilize mood and thinking so the  patient can be safe, to be medication compliant.   TENTATIVE LENGTH OF CARE:  2-3 days.       Theresa Watkins, N.P.                       Geoffery Lyons, MD    JO/MEDQ  D:  07/21/2002  T:  07/21/2002  Job:  [id number]

## 2011-04-05 NOTE — Discharge Summary (Signed)
Theresa Watkins, SKEENS NO.:  [id number]   MEDICAL RECORD NO.:  [id number]                   PATIENT TYPE:  IPS   LOCATION:  0405                                 [facility]ILITY[facility]   PHYSICIAN:  Jeanice Lim, M.D.              DATE OF BIRTH:  [birth year]   DATE OF ADMISSION:  07/20/2002  DATE OF DISCHARGE:  07/24/2002                                 DISCHARGE SUMMARY   IDENTIFYING DATA:  This is a 45 year old separated black female voluntarily  admitted, presenting with visual hallucinations since surgery and a fever of  100.3 in the emergency room.   MEDICATIONS:  None.   DRUG ALLERGIES:  ASPIRIN.   PHYSICAL EXAMINATION:  GENERAL: Essentially within normal limits.  NEUROLOGIC: Nonfocal.   LABORATORY DATA:  Routine admission labs: Alcohol level less than 5.  Urine  pregnancy test: Negative.  Urine drug screen: Positive for cocaine.  Urinalysis: Negative.   MENTAL STATUS EXAM:  The patient was a drowsy, thin, African-American  female, cooperative with fair eye contact.  Speech was soft spoken.  Mood:  Depressed.  Affect: Blunted.  Thought process: Goal directed.  Thought  content: Negative for dangerous ideation or psychotic symptoms.  Cognitive:  Intact.  Judgment and insight: Fair.   ADMISSION DIAGNOSES:   AXIS I:  1. Psychosis, not otherwise specified.  2. Cocaine abuse.   AXIS II:  None.   AXIS III:  Status post irrigation and debridement.   AXIS IV:  Moderate problems with housing and other psychosocial stressors.   AXIS V:  [id number]   HOSPITAL COURSE:  The patient was admitted, ordered routine p.r.n.  medications, underwent further monitoring.  The patient reported having seen  a girl in a red shirt prior to admission, which was a visual hallucination.  She was started on Zyprexa.  She reported a history of having abused  alcohol, cocaine, and cannabis, but this was distant, as per the patient.  The patient believed pain medication  may have caused psychotic symptoms.  The patient was seen by medicine for medicine consult regarding fever and  other medical complaints and recommendations were followed.  The patient was  discontinued off of Zyprexa and observed.  The patient appeared to do well  without the antipsychotic and the patient was monitored for an adequate  period of time.  She showed appropriate behavior and no psychotic symptoms.  Her mood symptoms throughout the hospitalization were monitored off of  Zyprexa and aftercare planning was completed.   CONDITION ON DISCHARGE:  Condition on discharge was improved.  Mood was more  euthymic.  Affect: Brighter.  Thought processes: Goal directed.  Thought  content: Negative for dangerous ideation or psychotic symptoms.  The patient  reported motivation to be medication and remain abstinent from substances of  abuse and be cautious with pain medicines.   DISCHARGE MEDICATIONS:  1. Ferrous sulfate 325 mg b.i.d.  2.  Colace 100 mg b.i.d.  3. Augmentin 875 mg b.i.d. until she complete prescription.   FOLLOW UP:  The patient was to follow up with [facility] for goiter and  rectal abscess and GYN followup as well.   DISCHARGE DIAGNOSES:   AXIS I:  1. History of cocaine, cannabis, and alcohol abuse.  2. Psychotic disorder, not otherwise specified.   AXIS II:  None.   AXIS III:  Status post irrigation and debridement.   AXIS IV:  Moderate problems with housing and other psychosocial stressors.   AXIS V:  Global assessment of functioning on discharge was 55.                                                Jeanice Lim, M.D.    JEM/MEDQ  D:  09/01/2002  T:  09/02/2002  Job:  [id number]

## 2011-04-05 NOTE — Discharge Summary (Signed)
NAMEMONCERRAT, BURNSTEIN NO.:  [id number]   MEDICAL RECORD NO.:  [id number]          PATIENT TYPE:  OBV   LOCATION:  9320                          FACILITY:  [facility]   PHYSICIAN:  Janine Limbo, M.D.DATE OF BIRTH:  1966-11-10   DATE OF ADMISSION:  09/23/2008  DATE OF DISCHARGE:  09/24/2008                               DISCHARGE SUMMARY   DISCHARGE DIAGNOSES:  Anemia, menorrhagia, symptomatic uterine fibroid,  depression, substance abuse, and goiter.  During the course of the  patient's hospital visit, she underwent an abdominal pelvic CT scan on  September 23, 2008, which showed no acute abdominal abnormalities.  Her  pelvic portion showed a uterus measuring 14 cm x 11 cm with multiple  fibroids.   PROCEDURES:  The patient was transfused a total of 4 units of packed red  blood cells.   HISTORY OF PRESENT ILLNESS:  Ms. Aguinaldo is a 45 year old female para 1-  0-2-1 who was transferred from [facility] Emergency  Department where she was seen for abdominal pain and severe weakness.  During her assessment, she was found to have a hemoglobin of 5.5,  hematocrit of 19.2, platelets 130, and her remaining labs (urinalysis  and basic metabolic panel were within normal limits).  The patient was  transfused 1 unit of packed red blood cells (B+) and transferred to  [facility].   HOSPITAL COURSE:  Upon arrival at [facility] [place], the  patient was then transfused a total of 3 additional units of packed red  blood cells, which brought her hemoglobin to 9.5.  She underwent other  tests which include gonorrhea, chlamydia, HIV, hepatitis C, and RPR all  of which were negative.  She underwent a urine drug screen which was  positive for cocaine, for which the patient received a social work  consultation to initiate or to arrange outpatient followup and  management of her substance abuse problem, as well as to secure a  primary  care physician.  The patient was found during her visit to have  a goiter; however, her TSH was within normal limits as was her free T3  though her T4 was low.  Additionally, the patient was positive for  hepatitis B.  On hospital day #2, the patient was clinically improved  and vital signs were stable.  She was therefore deemed ready for  discharge home.   DISCHARGE MEDICATIONS:  1. Ibuprofen 600 mg every 6 hours as needed for pain.  2. Ferrous sulfate 325 mg daily.  3. Colace 100 mg one to two times daily as needed for constipation.   FOLLOW UP:  The patient is to follow at [facility] on October 06, 2008, at 1 o'clock p.m.   DISCHARGE INSTRUCTIONS:  The patient was advised to call the doctor of  record if she should have any questions.  Activities were without  restrictions.  Diet was without restrictions, though the patient was  encouraged to increase her iron rich foods.      Elmira J. Adline Peals.      Merton Border  Zack Seal, M.D.  Electronically Signed    EJP/MEDQ  D:  11/03/2008  T:  11/03/2008  Job:  [id number]

## 2011-06-24 ENCOUNTER — Encounter (HOSPITAL_COMMUNITY): Payer: Self-pay | Admitting: *Deleted

## 2011-06-24 ENCOUNTER — Inpatient Hospital Stay (HOSPITAL_COMMUNITY)
Admission: AD | Admit: 2011-06-24 | Discharge: 2011-06-24 | Disposition: A | Discharge status: Home / Self Care | Payer: Self-pay | Source: Ambulatory Visit | Attending: Obstetrics & Gynecology | Admitting: Obstetrics & Gynecology

## 2011-06-24 DIAGNOSIS — R109 Unspecified abdominal pain: Secondary | ICD-10-CM | POA: Insufficient documentation

## 2011-06-24 DIAGNOSIS — N760 Acute vaginitis: Secondary | ICD-10-CM | POA: Insufficient documentation

## 2011-06-24 DIAGNOSIS — A499 Bacterial infection, unspecified: Secondary | ICD-10-CM | POA: Insufficient documentation

## 2011-06-24 DIAGNOSIS — B9689 Other specified bacterial agents as the cause of diseases classified elsewhere: Secondary | ICD-10-CM | POA: Insufficient documentation

## 2011-06-24 HISTORY — DX: Reserved for concepts with insufficient information to code with codable children: IMO0002

## 2011-06-24 HISTORY — DX: Urinary tract infection, site not specified: N39.0

## 2011-06-24 HISTORY — DX: Malignant neoplasm of thyroid gland: C73

## 2011-06-24 HISTORY — DX: Trichomoniasis, unspecified: A59.9

## 2011-06-24 HISTORY — DX: Benign neoplasm of connective and other soft tissue, unspecified: D21.9

## 2011-06-24 HISTORY — DX: Unspecified abnormal cytological findings in specimens from cervix uteri: R87.619

## 2011-06-24 HISTORY — DX: Unspecified ovarian cyst, unspecified side: N83.209

## 2011-06-24 HISTORY — DX: Headache: R51

## 2011-06-24 HISTORY — DX: Bronchitis, not specified as acute or chronic: J40

## 2011-06-24 HISTORY — DX: Unspecified convulsions: R56.9

## 2011-06-24 LAB — URINALYSIS, ROUTINE W REFLEX MICROSCOPIC
Bilirubin Urine: NEGATIVE
Glucose, UA: NEGATIVE mg/dL
Hgb urine dipstick: NEGATIVE
Protein, ur: NEGATIVE mg/dL
Specific Gravity, Urine: 1.025 (ref 1.005–1.030)
Urobilinogen, UA: 0.2 mg/dL (ref 0.0–1.0)

## 2011-06-24 MED ORDER — METRONIDAZOLE 500 MG PO TABS: 500.0000 mg | ORAL_TABLET | Freq: Two times a day (BID) | ORAL | Status: DC

## 2011-06-24 NOTE — Progress Notes (Signed)
Pain in lower abd for a wk, crampy last few days, gradually getting worse.
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
## 2011-06-24 NOTE — ED Provider Notes (Signed)
Theresa Watkins 30-Apr-1966 Wet prep +clue, many bacteria Rx Flagyl PO BID x 7 days Cancelled RPR and HIV. Needs to F/U w/ Gyn provider for additional STI testing.

## 2011-06-25 ENCOUNTER — Telehealth (HOSPITAL_COMMUNITY): Payer: Self-pay | Admitting: Obstetrics and Gynecology

## 2011-06-25 LAB — GC/CHLAMYDIA PROBE AMP, GENITAL
Chlamydia, DNA Probe: NEGATIVE
GC Probe Amp, Genital: NEGATIVE

## 2011-06-25 MED ORDER — METRONIDAZOLE 500 MG PO TABS: 500.0000 mg | ORAL_TABLET | Freq: Two times a day (BID) | ORAL | Status: AC

## 2011-08-20 LAB — CROSSMATCH
ABO/RH(D): B POS
Antibody Screen: NEGATIVE

## 2011-08-20 LAB — URINALYSIS, ROUTINE W REFLEX MICROSCOPIC
Bilirubin Urine: NEGATIVE
Glucose, UA: NEGATIVE
Specific Gravity, Urine: 1.021

## 2011-08-20 LAB — COMPREHENSIVE METABOLIC PANEL
ALT: 13
AST: 21
Albumin: 3.3 — ABNORMAL LOW
CO2: 25
Chloride: 108
Creatinine, Ser: 0.93
GFR calc Af Amer: >60
GFR calc non Af Amer: >60
Potassium: 3.9
Sodium: 139
Total Bilirubin: 0.4

## 2011-08-20 LAB — CBC
HCT: 31.3 — ABNORMAL LOW
Hemoglobin: 9.5 — ABNORMAL LOW
MCV: 69.9 — ABNORMAL LOW
Platelets: 113 — ABNORMAL LOW
Platelets: 130 — ABNORMAL LOW
RBC: 3.44 — ABNORMAL LOW
WBC: 4.9
WBC: 5.5

## 2011-08-20 LAB — HEPATITIS A ANTIBODY, TOTAL: Hep A Total Ab: POSITIVE — AB

## 2011-08-20 LAB — HEPATITIS B E ANTIBODY: Hep B E Ab: NEGATIVE

## 2011-08-20 LAB — RAPID URINE DRUG SCREEN, HOSP PERFORMED: Cocaine: POSITIVE — AB

## 2011-08-20 LAB — URINE MICROSCOPIC-ADD ON

## 2011-08-20 LAB — RPR: RPR Ser Ql: NONREACTIVE

## 2011-08-20 LAB — DIFFERENTIAL
Basophils Absolute: 0
Basophils Relative: 0
Eosinophils Absolute: 0
Lymphocytes Relative: 11 — ABNORMAL LOW
Lymphs Abs: 0.5 — ABNORMAL LOW
Neutro Abs: 4

## 2011-08-20 LAB — WET PREP, GENITAL: Yeast Wet Prep HPF POC: NONE SEEN

## 2011-08-20 LAB — GC/CHLAMYDIA PROBE AMP, GENITAL
Chlamydia, DNA Probe: NEGATIVE
GC Probe Amp, Genital: NEGATIVE

## 2011-08-20 LAB — T4, FREE: Free T4: 0.84 — ABNORMAL LOW

## 2011-08-20 LAB — TSH: TSH: 2.33

## 2011-09-24 ENCOUNTER — Encounter (HOSPITAL_COMMUNITY): Payer: Self-pay | Admitting: *Deleted

## 2011-09-24 ENCOUNTER — Emergency Department (INDEPENDENT_AMBULATORY_CARE_PROVIDER_SITE_OTHER)
Admission: EM | Admit: 2011-09-24 | Discharge: 2011-09-24 | Disposition: A | Discharge status: Home / Self Care | Payer: Self-pay | Source: Home / Self Care | Attending: Family Medicine | Admitting: Family Medicine

## 2011-09-24 ENCOUNTER — Emergency Department (HOSPITAL_COMMUNITY)
Admission: EM | Admit: 2011-09-24 | Discharge: 2011-09-24 | Disposition: A | Discharge status: Home / Self Care | Payer: Self-pay | Attending: Emergency Medicine | Admitting: Emergency Medicine

## 2011-09-24 ENCOUNTER — Emergency Department (INDEPENDENT_AMBULATORY_CARE_PROVIDER_SITE_OTHER): Payer: Self-pay

## 2011-09-24 DIAGNOSIS — IMO0002 Reserved for concepts with insufficient information to code with codable children: Secondary | ICD-10-CM

## 2011-09-24 DIAGNOSIS — Z0389 Encounter for observation for other suspected diseases and conditions ruled out: Secondary | ICD-10-CM | POA: Insufficient documentation

## 2011-09-24 MED ORDER — ACETAMINOPHEN-CODEINE #3 300-30 MG PO TABS: 1.0000 | ORAL_TABLET | Freq: Four times a day (QID) | ORAL | Status: AC | PRN
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
## 2011-09-24 NOTE — ED Notes (Signed)
Pt returned to [facility] to be seen.  No change in previous triage assessment.

## 2011-09-24 NOTE — ED Notes (Signed)
Called for patient in all waiting rooms again, no answer.
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
## 2011-10-30 ENCOUNTER — Encounter (HOSPITAL_COMMUNITY): Payer: Self-pay | Admitting: *Deleted

## 2011-10-30 ENCOUNTER — Emergency Department (HOSPITAL_COMMUNITY): Payer: Self-pay

## 2011-10-30 ENCOUNTER — Emergency Department (HOSPITAL_COMMUNITY)
Admission: EM | Admit: 2011-10-30 | Discharge: 2011-10-31 | Disposition: A | Discharge status: Home / Self Care | Payer: Self-pay | Attending: Emergency Medicine | Admitting: Emergency Medicine

## 2011-10-30 ENCOUNTER — Other Ambulatory Visit: Payer: Self-pay

## 2011-10-30 DIAGNOSIS — K5289 Other specified noninfective gastroenteritis and colitis: Secondary | ICD-10-CM | POA: Insufficient documentation

## 2011-10-30 DIAGNOSIS — R112 Nausea with vomiting, unspecified: Secondary | ICD-10-CM | POA: Insufficient documentation

## 2011-10-30 DIAGNOSIS — G40909 Epilepsy, unspecified, not intractable, without status epilepticus: Secondary | ICD-10-CM | POA: Insufficient documentation

## 2011-10-30 DIAGNOSIS — Z85850 Personal history of malignant neoplasm of thyroid: Secondary | ICD-10-CM | POA: Insufficient documentation

## 2011-10-30 DIAGNOSIS — K529 Noninfective gastroenteritis and colitis, unspecified: Secondary | ICD-10-CM

## 2011-10-30 DIAGNOSIS — R109 Unspecified abdominal pain: Secondary | ICD-10-CM | POA: Insufficient documentation

## 2011-10-30 DIAGNOSIS — R079 Chest pain, unspecified: Secondary | ICD-10-CM | POA: Insufficient documentation

## 2011-10-30 DIAGNOSIS — Z79899 Other long term (current) drug therapy: Secondary | ICD-10-CM | POA: Insufficient documentation

## 2011-10-30 LAB — COMPREHENSIVE METABOLIC PANEL
AST: 11 U/L (ref 0–37)
Albumin: 3.7 g/dL (ref 3.5–5.2)
BUN: 7 mg/dL (ref 6–23)
Calcium: 8.5 mg/dL (ref 8.4–10.5)
Creatinine, Ser: 0.71 mg/dL (ref 0.50–1.10)
Total Bilirubin: 0.3 mg/dL (ref 0.3–1.2)
Total Protein: 7.1 g/dL (ref 6.0–8.3)

## 2011-10-30 LAB — CBC
HCT: 44.7 % (ref 36.0–46.0)
Hemoglobin: 15.5 g/dL — ABNORMAL HIGH (ref 12.0–15.0)
MCH: 29 pg (ref 26.0–34.0)
MCHC: 34.7 g/dL (ref 30.0–36.0)
MCV: 83.6 fL (ref 78.0–100.0)
RDW: 13.6 % (ref 11.5–15.5)

## 2011-10-30 LAB — URINALYSIS, ROUTINE W REFLEX MICROSCOPIC
Glucose, UA: NEGATIVE mg/dL
Ketones, ur: NEGATIVE mg/dL
Leukocytes, UA: NEGATIVE
Nitrite: NEGATIVE
pH: 5.5 (ref 5.0–8.0)

## 2011-10-30 LAB — DIFFERENTIAL
Basophils Absolute: 0 10*3/uL (ref 0.0–0.1)
Basophils Relative: 0 % (ref 0–1)
Eosinophils Absolute: 0 10*3/uL (ref 0.0–0.7)
Eosinophils Relative: 1 % (ref 0–5)
Monocytes Absolute: 0.4 10*3/uL (ref 0.1–1.0)
Monocytes Relative: 5 % (ref 3–12)
Neutro Abs: 6.1 10*3/uL (ref 1.7–7.7)

## 2011-10-30 LAB — LIPASE, BLOOD: Lipase: 22 U/L (ref 11–59)

## 2011-10-30 LAB — POCT I-STAT TROPONIN I: Troponin i, poc: 0 ng/mL (ref 0.00–0.08)

## 2011-10-30 MED ORDER — CIPROFLOXACIN HCL 500 MG PO TABS
500.0000 mg | ORAL_TABLET | Freq: Once | ORAL | Status: AC
  Administered 2011-10-30: 500 mg via ORAL
  Filled 2011-10-30: qty 1

## 2011-10-30 MED ORDER — HYDROMORPHONE HCL PF 1 MG/ML IJ SOLN
1.0000 mg | Freq: Once | INTRAMUSCULAR | Status: AC
  Administered 2011-10-30: 1 mg via INTRAVENOUS
  Filled 2011-10-30: qty 1

## 2011-10-30 MED ORDER — SODIUM CHLORIDE 0.9 % IV SOLN
999.0000 mL | INTRAVENOUS | Status: DC
  Administered 2011-10-30: 1000 mL via INTRAVENOUS

## 2011-10-30 MED ORDER — DIPHENHYDRAMINE HCL 50 MG/ML IJ SOLN: 25.0000 mg | Freq: Once | INTRAMUSCULAR | Status: DC

## 2011-10-30 MED ORDER — ONDANSETRON HCL 4 MG/2ML IJ SOLN
4.0000 mg | Freq: Once | INTRAMUSCULAR | Status: AC
  Administered 2011-10-30: 4 mg via INTRAVENOUS
  Filled 2011-10-30: qty 2

## 2011-10-30 MED ORDER — METHYLPREDNISOLONE SODIUM SUCC 125 MG IJ SOLR: 125.0000 mg | Freq: Once | INTRAMUSCULAR | Status: DC

## 2011-10-30 MED ORDER — METRONIDAZOLE 500 MG PO TABS
500.0000 mg | ORAL_TABLET | Freq: Once | ORAL | Status: AC
  Administered 2011-10-30: 500 mg via ORAL
  Filled 2011-10-30: qty 1

## 2011-10-30 NOTE — ED Notes (Signed)
Pt states last BM was today and was normal. Mild nausea. Transient sharp, nagging pain since monday

## 2011-10-30 NOTE — ED Notes (Signed)
Pt reports she has had abdominal pain since eating at [employer] on Monday. Reports nausea and vomiting. Points to lower abdomen.

## 2011-10-30 NOTE — ED Provider Notes (Signed)
History     CSN: [id number] Arrival date & time: 10/30/2011  5:34 PM   First MD Initiated Contact with Patient 10/30/11 1930      Chief Complaint  Patient presents with  . Emesis  . Nausea  . Abdominal Pain  45 year old female comes in with lower abdominal pain which started 2 days ago. Pain started in the periumbilical area and spread across the lower abdomen and suprapubic area. Pain is crampy and is 10 out of 10 at its worst. In between as severe episodes, pain is still 6/10. There's been associated nausea and she is vomited once. She has had a loose bowel movement but no overt diarrhea. She states the pain started shortly after eating a double cheeseburger at [facility], but she also thinks that the pain might be related to eating some salmon that did not taste right the day before. She had an episode of right parasternal chest pain 5 days ago and that is still present to a mild degree. She had a fever about one week ago, but is not having fever now. Nothing makes her pain better, nothing makes it worse. She has not taken any medication for it. She is status post hysterectomy.  (Consider location/radiation/quality/duration/timing/severity/associated sxs/prior treatment) Patient is a 45 y.o. female presenting with vomiting and abdominal pain. The history is provided by the patient.  Emesis  Associated symptoms include abdominal pain.  Abdominal Pain The primary symptoms of the illness include abdominal pain and vomiting.    Past Medical History  Diagnosis Date  . Headache   . Seizures   . Thyroid cancer 40981  . Bronchitis   . Urinary tract infection   . Ovarian cyst   . Fibroid   . Abnormal Pap smear   . Trichomonas     Past Surgical History  Procedure Date  . Abdominal hysterectomy   . Laparoscopic 1988    removal  of ectopic preg, ruptured tube  . Therapeutic abortion   . Cesarean section   . Thyroidectomy march 2011    cancer  . Goiter     removed    History  reviewed. No pertinent family history.  History  Substance Use Topics  . Smoking status: Current Everyday Smoker -- 1.0 packs/day for 25 years    Types: Cigarettes  . Smokeless tobacco: Never Used  . Alcohol Use: Yes     occ    OB History    Grav Para Term Preterm Abortions TAB SAB Ect Mult Living   3 1 1  0 2 1 0 1 0 1      Review of Systems  Gastrointestinal: Positive for vomiting and abdominal pain.  All other systems reviewed and are negative.    Allergies  Aspirin; Iohexol; and Percocet  Home Medications   Current Outpatient Rx  Name Route Sig Dispense Refill  . ACETAMINOPHEN 500 MG PO TABS Oral Take 500 mg by mouth every 6 (six) hours as needed. For pain     . LEVOTHYROXINE SODIUM 100 MCG PO TABS Oral Take 100 mcg by mouth daily.        BP 117/78  Pulse 79  Temp(Src) 98.9 F (37.2 C) (Oral)  Resp 18  SpO2 97%  Physical Exam  Nursing note and vitals reviewed.  45 year old female is resting comfortably and in no acute distress. Vital signs are normal. Head is normocephalic and atraumatic. PERRLA, EOMI. There is no scleral icterus. Oropharynx is clear. Neck is supple without adenopathy. Back is nontender. There's no  CVA tenderness. Lungs are clear without rales, wheezes, or rhonchi. Heart has a regular rate and rhythm without murmur. There is very mild right parasternal tenderness. Abdomen is soft, flat, with severe tenderness in the right lower quadrant with very mild tenderness present in the left lower quadrant. There is tenderness to percussion over the right lower quadrant but there is no overt rebound tenderness or guarding. Peristalsis is diminished. Extremities have no cyanosis or edema, full range of motion present. Skin is warm and moist without rash. Neurologic: Mental status is normal, cranial nerves are intact, there are no motor or sensory deficits. Psychiatric: Normal mood and affect.  ED Course  Procedures (including critical care time)   Labs  Reviewed  CBC  DIFFERENTIAL  COMPREHENSIVE METABOLIC PANEL  LIPASE, BLOOD  URINALYSIS, ROUTINE W REFLEX MICROSCOPIC  POCT PREGNANCY, URINE   Results for orders placed during the hospital encounter of 10/30/11  CBC      Component Value Range   WBC 7.8  4.0 - 10.5 (K/uL)   RBC 5.35 (*) 3.87 - 5.11 (MIL/uL)   Hemoglobin 15.5 (*) 12.0 - 15.0 (g/dL)   HCT 16.1  09.6 - 04.5 (%)   MCV 83.6  78.0 - 100.0 (fL)   MCH 29.0  26.0 - 34.0 (pg)   MCHC 34.7  30.0 - 36.0 (g/dL)   RDW 40.9  81.1 - 91.4 (%)   Platelets 231  150 - 400 (K/uL)  DIFFERENTIAL      Component Value Range   Neutrophils Relative 78 (*) 43 - 77 (%)   Neutro Abs 6.1  1.7 - 7.7 (K/uL)   Lymphocytes Relative 16  12 - 46 (%)   Lymphs Abs 1.2  0.7 - 4.0 (K/uL)   Monocytes Relative 5  3 - 12 (%)   Monocytes Absolute 0.4  0.1 - 1.0 (K/uL)   Eosinophils Relative 1  0 - 5 (%)   Eosinophils Absolute 0.0  0.0 - 0.7 (K/uL)   Basophils Relative 0  0 - 1 (%)   Basophils Absolute 0.0  0.0 - 0.1 (K/uL)  COMPREHENSIVE METABOLIC PANEL      Component Value Range   Sodium 139  135 - 145 (mEq/L)   Potassium 3.4 (*) 3.5 - 5.1 (mEq/L)   Chloride 105  96 - 112 (mEq/L)   CO2 23  19 - 32 (mEq/L)   Glucose, Bld 113 (*) 70 - 99 (mg/dL)   BUN 7  6 - 23 (mg/dL)   Creatinine, Ser 7.82  0.50 - 1.10 (mg/dL)   Calcium 8.5  8.4 - 95.6 (mg/dL)   Total Protein 7.1  6.0 - 8.3 (g/dL)   Albumin 3.7  3.5 - 5.2 (g/dL)   AST 11  0 - 37 (U/L)   ALT 10  0 - 35 (U/L)   Alkaline Phosphatase 48  39 - 117 (U/L)   Total Bilirubin 0.3  0.3 - 1.2 (mg/dL)   GFR calc non Af Amer >90  >90 (mL/min)   GFR calc Af Amer >90  >90 (mL/min)  LIPASE, BLOOD      Component Value Range   Lipase 22  11 - 59 (U/L)  URINALYSIS, ROUTINE W REFLEX MICROSCOPIC      Component Value Range   Color, Urine YELLOW  YELLOW    APPearance CLEAR  CLEAR    Specific Gravity, Urine 1.015  1.005 - 1.030    pH 5.5  5.0 - 8.0    Glucose, UA NEGATIVE  NEGATIVE (mg/dL)  Hgb urine dipstick  NEGATIVE  NEGATIVE    Bilirubin Urine NEGATIVE  NEGATIVE    Ketones, ur NEGATIVE  NEGATIVE (mg/dL)   Protein, ur NEGATIVE  NEGATIVE (mg/dL)   Urobilinogen, UA 0.2  0.0 - 1.0 (mg/dL)   Nitrite NEGATIVE  NEGATIVE    Leukocytes, UA NEGATIVE  NEGATIVE   POCT I-STAT TROPONIN I      Component Value Range   Troponin i, poc 0.00  0.00 - 0.08 (ng/mL)   Comment 3            Ct Abdomen Pelvis Wo Contrast  10/30/2011  *RADIOLOGY REPORT*  Clinical Data: Nausea and abdominal pain.  CT ABDOMEN AND PELVIS WITHOUT CONTRAST  Technique:  Multidetector CT imaging of the abdomen and pelvis was performed following the standard protocol without intravenous contrast.  Comparison: 09/22/2008  Findings: This exam is limited in that intravenous contrast material was not administered.  Visualized portions of the liver and spleen have normal uninfused features.  The stomach, duodenum, pancreas, gallbladder, adrenal glands, and left kidney are unremarkable.  13 mm low density lesion in the right kidney is stable in the interval.  No abdominal aortic aneurysm.  There is no free fluid or lymphadenopathy in the abdomen.  The abdominal bowel loops are unremarkable.  Imaging through the pelvis shows a small amount of intraperitoneal free fluid.  There is some bowel loops in the right lower quadrant which demonstrate wall thickening and appeared have some associated mesenteric edema.  The terminal ileum is unremarkable.  The appendix is unremarkable.  No evidence for colonic diverticulitis.  Bone windows reveal no worrisome lytic or sclerotic osseous lesions.  IMPRESSION: Abnormal small bowel wall thickening and ileal loops of the right lower quadrant with associated mesenteric and intraperitoneal fluid.  This process is nonobstructing and does not involve the terminal ileum.  Main differential considerations would include an infectious or inflammatory enteritis.  Ischemic enteritis cannot be excluded.  Original Report Authenticated By:  ERIC A. MANSELL, M.D.     No results found.   Date: 10/30/2011  Rate: 63  Rhythm: normal sinus rhythm and Sinus arrhythmia  QRS Axis: normal  Intervals: normal  ST/T Wave abnormalities: normal  Conduction Disutrbances:none  Narrative Interpretation: Borderline prolonged QT interval. Compared with ECG of 10/30/2010, and QTc interval is slightly shorter.  Old EKG Reviewed: unchanged    No diagnosis found.  Patient has allergy to IV dye listed but she says that as far as she can remember she is not allergic to it. I've gone over her old records and she did have tongue swelling and itching after IV contrast for a CT of her neck in 2010.  CT scan shows evidence of inflammatory changes in the small bowel the right lower quadrant which most likely are infectious given the patient's history. She has no risk factors for ischemic enteritis. With normal WBC, she will be treated with oral antibiotics and oral analgesics. MDM  Abdominal pain which most likely represents appendicitis.        Dione Booze, MD 10/31/11 [phone]

## 2011-10-31 MED ORDER — ONDANSETRON HCL 4 MG/2ML IJ SOLN
4.0000 mg | Freq: Once | INTRAMUSCULAR | Status: AC
  Administered 2011-10-31: 4 mg via INTRAMUSCULAR
  Filled 2011-10-31: qty 2

## 2011-10-31 MED ORDER — HYDROMORPHONE HCL 2 MG PO TABS
2.0000 mg | ORAL_TABLET | Freq: Once | ORAL | Status: AC
  Administered 2011-10-31: 2 mg via ORAL
  Filled 2011-10-31: qty 1

## 2011-10-31 MED ORDER — CIPROFLOXACIN HCL 500 MG PO TABS: 500.0000 mg | ORAL_TABLET | Freq: Two times a day (BID) | ORAL | Status: AC

## 2011-10-31 MED ORDER — METOCLOPRAMIDE HCL 10 MG PO TABS: 10.0000 mg | ORAL_TABLET | Freq: Four times a day (QID) | ORAL | Status: AC | PRN

## 2011-10-31 MED ORDER — METRONIDAZOLE 500 MG PO TABS: 500.0000 mg | ORAL_TABLET | Freq: Three times a day (TID) | ORAL | Status: AC

## 2011-10-31 MED ORDER — HYDROMORPHONE HCL 2 MG PO TABS: 2.0000 mg | ORAL_TABLET | ORAL | Status: AC | PRN

## 2012-03-06 ENCOUNTER — Other Ambulatory Visit (HOSPITAL_COMMUNITY): Payer: Self-pay | Admitting: Family Medicine

## 2012-03-06 DIAGNOSIS — Z1231 Encounter for screening mammogram for malignant neoplasm of breast: Secondary | ICD-10-CM

## 2012-03-31 ENCOUNTER — Ambulatory Visit (HOSPITAL_COMMUNITY): Payer: Self-pay

## 2012-04-27 ENCOUNTER — Ambulatory Visit (HOSPITAL_COMMUNITY): Payer: Self-pay | Attending: Family Medicine

## 2012-06-03 DIAGNOSIS — I10 Essential (primary) hypertension: Secondary | ICD-10-CM | POA: Insufficient documentation

## 2013-05-07 ENCOUNTER — Emergency Department (HOSPITAL_COMMUNITY)
Admission: EM | Admit: 2013-05-07 | Discharge: 2013-05-07 | Disposition: A | Discharge status: Home / Self Care | Payer: Self-pay | Attending: Emergency Medicine | Admitting: Emergency Medicine

## 2013-05-07 ENCOUNTER — Encounter (HOSPITAL_COMMUNITY): Payer: Self-pay | Admitting: Emergency Medicine

## 2013-05-07 ENCOUNTER — Telehealth (HOSPITAL_COMMUNITY): Payer: Self-pay | Admitting: Emergency Medicine

## 2013-05-07 DIAGNOSIS — Z8709 Personal history of other diseases of the respiratory system: Secondary | ICD-10-CM | POA: Insufficient documentation

## 2013-05-07 DIAGNOSIS — Z85850 Personal history of malignant neoplasm of thyroid: Secondary | ICD-10-CM | POA: Insufficient documentation

## 2013-05-07 DIAGNOSIS — Z8669 Personal history of other diseases of the nervous system and sense organs: Secondary | ICD-10-CM | POA: Insufficient documentation

## 2013-05-07 DIAGNOSIS — Z87440 Personal history of urinary (tract) infections: Secondary | ICD-10-CM | POA: Insufficient documentation

## 2013-05-07 DIAGNOSIS — M25579 Pain in unspecified ankle and joints of unspecified foot: Secondary | ICD-10-CM | POA: Insufficient documentation

## 2013-05-07 DIAGNOSIS — L03119 Cellulitis of unspecified part of limb: Secondary | ICD-10-CM | POA: Insufficient documentation

## 2013-05-07 DIAGNOSIS — F17200 Nicotine dependence, unspecified, uncomplicated: Secondary | ICD-10-CM | POA: Insufficient documentation

## 2013-05-07 DIAGNOSIS — L02611 Cutaneous abscess of right foot: Secondary | ICD-10-CM

## 2013-05-07 DIAGNOSIS — M79671 Pain in right foot: Secondary | ICD-10-CM

## 2013-05-07 DIAGNOSIS — Z8742 Personal history of other diseases of the female genital tract: Secondary | ICD-10-CM | POA: Insufficient documentation

## 2013-05-07 DIAGNOSIS — Z8619 Personal history of other infectious and parasitic diseases: Secondary | ICD-10-CM | POA: Insufficient documentation

## 2013-05-07 DIAGNOSIS — L02619 Cutaneous abscess of unspecified foot: Secondary | ICD-10-CM | POA: Insufficient documentation

## 2013-05-07 DIAGNOSIS — Z79899 Other long term (current) drug therapy: Secondary | ICD-10-CM | POA: Insufficient documentation

## 2013-05-07 DIAGNOSIS — IMO0001 Reserved for inherently not codable concepts without codable children: Secondary | ICD-10-CM | POA: Insufficient documentation

## 2013-05-07 MED ORDER — SULFAMETHOXAZOLE-TRIMETHOPRIM 800-160 MG PO TABS: 1.0000 | ORAL_TABLET | Freq: Two times a day (BID) | ORAL | Status: AC

## 2013-05-07 MED ORDER — HYDROCODONE-ACETAMINOPHEN 5-325 MG PO TABS
2.0000 | ORAL_TABLET | Freq: Once | ORAL | Status: AC
  Administered 2013-05-07: 2 via ORAL
  Filled 2013-05-07: qty 2

## 2013-05-07 MED ORDER — HYDROCODONE-ACETAMINOPHEN 5-325 MG PO TABS: 1.0000 | ORAL_TABLET | Freq: Four times a day (QID) | ORAL | Status: DC | PRN

## 2013-05-07 NOTE — ED Notes (Signed)
Pt sts right foot pain and thinks she may have been bitten by something at big toe; no obvious bite mark noted

## 2013-05-07 NOTE — ED Provider Notes (Signed)
History     CSN: [id number]  Arrival date & time 05/07/13  2956   First MD Initiated Contact with Patient 05/07/13 [phone]      Chief Complaint  Patient presents with  . Foot Pain    (Consider location/radiation/quality/duration/timing/severity/associated sxs/prior treatment) HPI Comments: Patient is a 47 year old female who presents for a pain on the plantar aspect of her right foot at the base of her great toe. Patient states that she was walking to her mailbox when she felt something pinching the bottom of her foot. Patient disregarded sensation thinking she had squeezing a piece of skin. Patient states that pain has progressed to a sharp pain that is worse with weight bearing and palpation and without alleviating factors. Patient took tylenol with no symptom improvement. Patient denies fevers, pallor, coolness in her extremity, extremity weakness, and inability to ambulate.  Per nurse, patient endorses "snorting some powder" for the pain.  Patient is a 47 y.o. female presenting with lower extremity pain. The history is provided by the patient. No language interpreter was used.  Foot Pain This is a new problem. The current episode started yesterday (last night). The problem occurs constantly. The problem has been gradually worsening. Associated symptoms include myalgias. Pertinent negatives include no chills, diaphoresis, fever, rash or weakness. Exacerbated by: palpation and bearing weight. She has tried acetaminophen for the symptoms. The treatment provided no relief.    Past Medical History  Diagnosis Date  . Headache(784.0)   . Seizures   . Thyroid cancer 86578  . Bronchitis   . Urinary tract infection   . Ovarian cyst   . Fibroid   . Abnormal Pap smear   . Trichomonas     Past Surgical History  Procedure Laterality Date  . Abdominal hysterectomy    . Laparoscopic  1988    removal  of ectopic preg, ruptured tube  . Therapeutic abortion    . Cesarean section    .  Thyroidectomy  march 2011    cancer  . Goiter      removed    History reviewed. No pertinent family history.  History  Substance Use Topics  . Smoking status: Current Every Day Smoker -- 1.00 packs/day for 25 years    Types: Cigarettes  . Smokeless tobacco: Never Used  . Alcohol Use: Yes     Comment: occ    OB History   Grav Para Term Preterm Abortions TAB SAB Ect Mult Living   3 1 1  0 2 1 0 1 0 1      Review of Systems  Constitutional: Negative for fever, chills and diaphoresis.  Musculoskeletal: Positive for myalgias.  Skin: Negative for pallor and rash.  Neurological: Negative for weakness.  All other systems reviewed and are negative.    Allergies  Aspirin; Iohexol; and Percocet  Home Medications   Current Outpatient Rx  Name  Route  Sig  Dispense  Refill  . acetaminophen (TYLENOL) 500 MG tablet   Oral   Take 500 mg by mouth every 6 (six) hours as needed. For pain          . Calcium Carbonate-Vitamin D (CALCIUM + D PO)   Oral   Take 1 tablet by mouth 2 (two) times daily.         . ferrous sulfate 325 (65 FE) MG tablet   Oral   Take 325 mg by mouth daily.         Marland Kitchen levothyroxine (SYNTHROID, LEVOTHROID) 112 MCG tablet  Oral   Take 112 mcg by mouth daily before breakfast.         . HYDROcodone-acetaminophen (NORCO/VICODIN) 5-325 MG per tablet   Oral   Take 1 tablet by mouth every 6 (six) hours as needed for pain. For pain management as needed today.   2 tablet   0   . sulfamethoxazole-trimethoprim (BACTRIM DS,SEPTRA DS) 800-160 MG per tablet   Oral   Take 1 tablet by mouth 2 (two) times daily.   10 tablet   0     BP 144/91  Pulse 77  Temp(Src) 98.2 F (36.8 C) (Oral)  Resp 20  SpO2 98%  Physical Exam  Nursing note and vitals reviewed. Constitutional: She is oriented to person, place, and time. She appears well-developed and well-nourished. No distress.  HENT:  Head: Normocephalic and atraumatic.  Eyes: Conjunctivae and EOM are  normal. No scleral icterus.  Neck: Normal range of motion.  Cardiovascular: Normal rate, regular rhythm and intact distal pulses.   Distal radial and dorsalis pedis pulses 2+ bilaterally. Capillary refill normal.  Pulmonary/Chest: Effort normal. No respiratory distress.  Musculoskeletal: Normal range of motion. She exhibits no edema.       Right foot: She exhibits tenderness. She exhibits normal range of motion, no bony tenderness, no swelling, normal capillary refill, no crepitus and no deformity.  Small planar punctate area on plantar aspect of patient's R foot at lateral base of great toe; skin over top is mildly callused. Resembles verruca plantaris. +TTP which is out of proportion to physical exam findings.  Neurological: She is alert and oriented to person, place, and time.  DTRs normal and symmetric. No sensory or motor deficits appreciated.  Skin: Skin is warm and dry. No rash noted. She is not diaphoretic. No pallor.  Psychiatric: She has a normal mood and affect. Her behavior is normal.    ED Course  Procedures (including critical care time)  Labs Reviewed - No data to display No results found.  INCISION AND DRAINAGE Performed by: Antony Madura Consent: Verbal consent obtained. Risks and benefits: risks, benefits and alternatives were discussed Type: abscess  Body area: plantar aspect of R foot at base of R great toe  Anesthesia: local infiltration  Incision was made with a scalpel.  Local anesthetic: lidocaine 2% without epinephrine  Anesthetic total: 4 ml  Complexity: complex Blunt dissection to break up loculations  Drainage: purulent  Drainage amount: small  Packing material: none  Patient tolerance: Patient tolerated the procedure well with no immediate complications.    1. Foot pain, right   2. Abscess of foot including toes, right      MDM  Uncomplicated abscess of R foot on plantar aspect at base of great toe. Patient neurovascularly intact on  exam. No sensory or motor deficits and DTRs normal/symmetric. Norco given in ED for pain prior to I&D. Small amount of purulent drainage expressed from area where pain present c/w early abscess. Bacitracin and gauze dressing applied in ED post procedure. Patient appropriate for d/c with PCP follow up and course of Bactrim DS for infection. 1 day of Norco given for severe pain otherwise ibuprofen or tylenol advised. PCP follow up recommended and resource guide given. Indications for ED return discussed with patient who verbalizes comfort and understanding with plan.        Antony Madura, PA-C 05/12/13 1233

## 2013-05-17 NOTE — ED Provider Notes (Signed)
Medical screening examination/treatment/procedure(s) were performed by non-physician practitioner and as supervising physician I was immediately available for consultation/collaboration.  Areej Tayler, MD 05/17/13 0729 

## 2014-01-25 DIAGNOSIS — Z85850 Personal history of malignant neoplasm of thyroid: Secondary | ICD-10-CM | POA: Insufficient documentation

## 2014-05-09 ENCOUNTER — Encounter (HOSPITAL_COMMUNITY): Payer: Self-pay | Admitting: General Practice

## 2014-05-09 ENCOUNTER — Inpatient Hospital Stay (HOSPITAL_COMMUNITY)
Admission: AD | Admit: 2014-05-09 | Discharge: 2014-05-09 | Disposition: A | Discharge status: Home / Self Care | Payer: Self-pay | Source: Ambulatory Visit | Attending: Obstetrics & Gynecology | Admitting: Obstetrics & Gynecology

## 2014-05-09 DIAGNOSIS — A5901 Trichomonal vulvovaginitis: Secondary | ICD-10-CM

## 2014-05-09 DIAGNOSIS — F1410 Cocaine abuse, uncomplicated: Secondary | ICD-10-CM

## 2014-05-09 DIAGNOSIS — F17200 Nicotine dependence, unspecified, uncomplicated: Secondary | ICD-10-CM | POA: Insufficient documentation

## 2014-05-09 DIAGNOSIS — N898 Other specified noninflammatory disorders of vagina: Secondary | ICD-10-CM | POA: Insufficient documentation

## 2014-05-09 LAB — WET PREP, GENITAL: Yeast Wet Prep HPF POC: NONE SEEN

## 2014-05-09 LAB — CBC
HEMATOCRIT: 41.8 % (ref 36.0–46.0)
Hemoglobin: 14.1 g/dL (ref 12.0–15.0)
MCH: 29 pg (ref 26.0–34.0)
MCHC: 33.7 g/dL (ref 30.0–36.0)
MCV: 85.8 fL (ref 78.0–100.0)
PLATELETS: 182 10*3/uL (ref 150–400)
RBC: 4.87 MIL/uL (ref 3.87–5.11)
RDW: 13.4 % (ref 11.5–15.5)
WBC: 7.2 10*3/uL (ref 4.0–10.5)

## 2014-05-09 LAB — RAPID URINE DRUG SCREEN, HOSP PERFORMED
AMPHETAMINES: NOT DETECTED
BENZODIAZEPINES: NOT DETECTED
Barbiturates: NOT DETECTED
COCAINE: POSITIVE — AB
OPIATES: NOT DETECTED
Tetrahydrocannabinol: NOT DETECTED

## 2014-05-09 LAB — URINALYSIS, ROUTINE W REFLEX MICROSCOPIC
Bilirubin Urine: NEGATIVE
GLUCOSE, UA: NEGATIVE mg/dL
Hgb urine dipstick: NEGATIVE
Ketones, ur: NEGATIVE mg/dL
NITRITE: NEGATIVE
PROTEIN: NEGATIVE mg/dL
Specific Gravity, Urine: 1.015 (ref 1.005–1.030)
Urobilinogen, UA: 0.2 mg/dL (ref 0.0–1.0)
pH: 7 (ref 5.0–8.0)

## 2014-05-09 LAB — URINE MICROSCOPIC-ADD ON

## 2014-05-09 MED ORDER — METRONIDAZOLE 500 MG PO TABS
2000.0000 mg | ORAL_TABLET | ORAL | Status: AC
  Administered 2014-05-09: 2000 mg via ORAL
  Filled 2014-05-09: qty 4

## 2014-05-09 NOTE — MAU Provider Note (Signed)
History     CSN: [id number]  Arrival date and time: 05/09/14 2952   First Provider Initiated Contact with Patient 05/09/14 [phone]      Chief Complaint  Patient presents with  . Vaginal Bleeding   HPI  Theresa Watkins is a 48 y/o [id number] black female who presents to the MAU with complaints of vaginal bleeding, pain, odor and discharge x 5 days. Pt states that since January she has noticed a fishy odor and a white discharge that comes and goes. 5 days ago, pt states she has had these symptoms reoccur and has taken a one time dose of Monistat. The symptoms were not relieved and the pt noticed pain with insertion of the applicator along with blood on the applicator after removal. Pt has had a hysterectomy 3 years ago and is concerned as to why there is vaginal bleeding. Pt states that she has had Trichomonas in the past and feels that this may be what is causing her symptoms. Pt denies any recent sexual activity. Pt denies fevers, chills, vaginal itching, and abdominal pain. Pt endorses mild nausea x 3 days and hot flashes. All other review of systems negative.   Past Medical History  Diagnosis Date  . Headache(784.0)   . Seizures   . 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 Bronchitis   . Urinary tract infection   . Ovarian cyst   . Fibroid   . Abnormal Pap smear   . Trichomonas     Past Surgical History  Procedure Laterality Date  . Abdominal hysterectomy    . Laparoscopic  1988    removal  of ectopic preg, ruptured tube  . Therapeutic abortion    . Cesarean section    . Thyroidectomy  march 2011    cancer  . Goiter      removed    History reviewed. No pertinent family history.  History  Substance Use Topics  . Smoking status: Current Every Day Smoker -- 0.50 packs/day for 25 years    Types: Cigarettes  . Smokeless tobacco: Never Used  . Alcohol Use: Yes     Comment: occ    Allergies:  Allergies  Allergen Reactions  . Aspirin     Stomach ache  . Iohexol      Code: HIVES, Desc:  pts tongue began itching post injection and throat burning, Onset Date: [id number]   . Percocet [Oxycodone-Acetaminophen] Rash    Prescriptions prior to admission  Medication Sig Dispense Refill  . acetaminophen (TYLENOL) 500 MG tablet Take 500 mg by mouth every 6 (six) hours as needed. For pain       . Calcium Carbonate-Vitamin D (CALCIUM + D PO) Take 1 tablet by mouth 2 (two) times daily.      . ferrous sulfate 325 (65 FE) MG tablet Take 325 mg by mouth daily.      Marland Kitchen HYDROcodone-acetaminophen (NORCO/VICODIN) 5-325 MG per tablet Take 1 tablet by mouth every 6 (six) hours as needed for pain. For pain management as needed today.  2 tablet  0  . levothyroxine (SYNTHROID, LEVOTHROID) 112 MCG tablet Take 112 mcg by mouth daily before breakfast.        Review of Systems  Constitutional: Negative for fever, chills and weight loss.  Respiratory: Negative for shortness of breath.   Cardiovascular: Negative for chest pain.  Gastrointestinal: Positive for nausea (x 3 days) and blood in stool (attributes this to internal hemorrhoids ). Negative for vomiting, abdominal pain, diarrhea and constipation.  Genitourinary: Negative  for hematuria.  Neurological: Negative for dizziness and headaches.   Physical Exam   Blood pressure 143/84, pulse 67, temperature 98.5 F (36.9 C), temperature source Oral, resp. rate 18, height 5' 5.56" (1.665 m), weight 70.852 kg (156 lb 3.2 oz).  Physical Exam  Constitutional: She is oriented to person, place, and time. She appears well-developed and well-nourished. No distress.  Cardiovascular: Normal rate, regular rhythm and normal heart sounds.   Respiratory: Effort normal and breath sounds normal.  GI: Soft. Bowel sounds are normal. She exhibits no distension. There is no tenderness.  Genitourinary: Pelvic exam was performed with patient supine. There is no rash or lesion on the right labia. There is no rash or lesion on the left labia. Uterus is not tender. Cervix  exhibits no motion tenderness and no discharge. Right adnexum displays no mass and no tenderness. Left adnexum displays no mass and no tenderness. There is tenderness (mild vaginal discomfort) around the vagina. No erythema or bleeding around the vagina. Vaginal discharge (mild amounts of thick white discharge) found.  Neurological: She is alert and oriented to person, place, and time.  Skin: Skin is warm and dry.    Results for orders placed during the hospital encounter of 05/09/14 (from the past 24 hour(s))  URINALYSIS, ROUTINE W REFLEX MICROSCOPIC     Status: Abnormal   Collection Time    05/09/14  9:02 AM      Result Value Ref Range   Color, Urine YELLOW  YELLOW   APPearance CLEAR  CLEAR   Specific Gravity, Urine 1.015  1.005 - 1.030   pH 7.0  5.0 - 8.0   Glucose, UA NEGATIVE  NEGATIVE mg/dL   Hgb urine dipstick NEGATIVE  NEGATIVE   Bilirubin Urine NEGATIVE  NEGATIVE   Ketones, ur NEGATIVE  NEGATIVE mg/dL   Protein, ur NEGATIVE  NEGATIVE mg/dL   Urobilinogen, UA 0.2  0.0 - 1.0 mg/dL   Nitrite NEGATIVE  NEGATIVE   Leukocytes, UA SMALL (*) NEGATIVE  URINE MICROSCOPIC-ADD ON     Status: Abnormal   Collection Time    05/09/14  9:02 AM      Result Value Ref Range   Squamous Epithelial / LPF RARE  RARE   WBC, UA 7-10  <3 WBC/hpf   Bacteria, UA FEW (*) RARE   Urine-Other TRICHOMONAS PRESENT    CBC     Status: None   Collection Time    05/09/14  9:35 AM      Result Value Ref Range   WBC 7.2  4.0 - 10.5 K/uL   RBC 4.87  3.87 - 5.11 MIL/uL   Hemoglobin 14.1  12.0 - 15.0 g/dL   HCT 41.8  36.0 - 46.0 %   MCV 85.8  78.0 - 100.0 fL   MCH 29.0  26.0 - 34.0 pg   MCHC 33.7  30.0 - 36.0 g/dL   RDW 13.4  11.5 - 15.5 %   Platelets 182  150 - 400 K/uL    MAU Course  Procedures  MDM Pelvic exam performed to include wet prep and GC/Chlamydia Given Flagyl 2000 mg PO, one time dose   Assessment and Plan  Trichomonas   Plan: Educate pt about Trich and sexual  transmission Follow-up if symptoms persist or worsen after inpatient tx  Lillia Abed 05/09/2014, 10:02 AM   I have seen this patient and agree with the above PA student's note. There was no blood noted during pelvic exam or on glove following bimanual  exam.   [employer], [place] Certified Nurse-Midwife

## 2014-05-09 NOTE — MAU Note (Signed)
Patient states she has had a hysterectomy. Used OTC medication for bacterial infection and when she removed the applicator she had blood on it.

## 2014-05-09 NOTE — MAU Provider Note (Signed)
Attestation of Attending Supervision of Advanced Practitioner (CNM/NP): Evaluation and management procedures were performed by the Advanced Practitioner under my supervision and collaboration. I have reviewed the Advanced Practitioner's note and chart, and I agree with the management and plan.  LEGGETT,KELLY H. 3:04 PM

## 2014-05-09 NOTE — Discharge Instructions (Signed)
Trichomoniasis Trichomoniasis is an infection caused by an organism called Trichomonas. The infection can affect both women and men. In women, the outer female genitalia and the vagina are affected. In men, the penis is mainly affected, but the prostate and other reproductive organs can also be involved. Trichomoniasis is a sexually transmitted infection (STI) and is most often passed to another person through sexual contact.  RISK FACTORS  Having unprotected sexual intercourse.  Having sexual intercourse with an infected partner. SIGNS AND SYMPTOMS  Symptoms of trichomoniasis in women include:  Abnormal gray-green frothy vaginal discharge.  Itching and irritation of the vagina.  Itching and irritation of the area outside the vagina. Symptoms of trichomoniasis in men include:   Penile discharge with or without pain.  Pain during urination. This results from inflammation of the urethra. DIAGNOSIS  Trichomoniasis may be found during a Pap test or physical exam. Your health care provider may use one of the following methods to help diagnose this infection:  Examining vaginal discharge under a microscope. For men, urethral discharge would be examined.  Testing the pH of the vagina with a test tape.  Using a vaginal swab test that checks for the Trichomonas organism. A test is available that provides results within a few minutes.  Doing a culture test for the organism. This is not usually needed. TREATMENT   You may be given medicine to fight the infection. Women should inform their health care provider if they could be or are pregnant. Some medicines used to treat the infection should not be taken during pregnancy.  Your health care provider may recommend over-the-counter medicines or creams to decrease itching or irritation.  Your sexual partner will need to be treated if infected. HOME CARE INSTRUCTIONS   Take all medicine prescribed by your health care provider.  Take  over-the-counter medicine for itching or irritation as directed by your health care provider.  Do not have sexual intercourse while you have the infection.  Women should not douche or wear tampons while they have the infection.  Discuss your infection with your partner. Your partner may have gotten the infection from you, or you may have gotten it from your partner.  Have your sex partner get examined and treated if necessary.  Practice safe, informed, and protected sex.  See your health care provider for other STI testing. SEEK MEDICAL CARE IF:   You still have symptoms after you finish your medicine.  You develop abdominal pain.  You have pain when you urinate.  You have bleeding after sexual intercourse.  You develop a rash.  Your medicine makes you sick or makes you throw up (vomit). Document Released: 04/30/2001 Document Revised: 11/09/2013 Document Reviewed: 08/16/2013 [facility] Patient Information 2015 [place], [place]. This information is not intended to replace advice given to you by your health care provider. Make sure you discuss any questions you have with your health care provider.

## 2014-05-10 LAB — GC/CHLAMYDIA PROBE AMP
CT PROBE, AMP APTIMA: NEGATIVE
GC PROBE AMP APTIMA: NEGATIVE

## 2014-09-19 ENCOUNTER — Encounter (HOSPITAL_COMMUNITY): Payer: Self-pay | Admitting: General Practice

## 2014-12-06 ENCOUNTER — Encounter (HOSPITAL_COMMUNITY): Payer: Self-pay | Admitting: Neurology

## 2014-12-06 ENCOUNTER — Emergency Department (HOSPITAL_COMMUNITY): Payer: Self-pay

## 2014-12-06 ENCOUNTER — Emergency Department (HOSPITAL_COMMUNITY)
Admission: EM | Admit: 2014-12-06 | Discharge: 2014-12-06 | Disposition: A | Discharge status: Home / Self Care | Payer: Self-pay | Attending: Emergency Medicine | Admitting: Emergency Medicine

## 2014-12-06 DIAGNOSIS — Z8709 Personal history of other diseases of the respiratory system: Secondary | ICD-10-CM | POA: Insufficient documentation

## 2014-12-06 DIAGNOSIS — Y998 Other external cause status: Secondary | ICD-10-CM | POA: Insufficient documentation

## 2014-12-06 DIAGNOSIS — Z79899 Other long term (current) drug therapy: Secondary | ICD-10-CM | POA: Insufficient documentation

## 2014-12-06 DIAGNOSIS — Z720 Tobacco use: Secondary | ICD-10-CM | POA: Insufficient documentation

## 2014-12-06 DIAGNOSIS — S0990XA Unspecified injury of head, initial encounter: Secondary | ICD-10-CM | POA: Insufficient documentation

## 2014-12-06 DIAGNOSIS — Y9389 Activity, other specified: Secondary | ICD-10-CM | POA: Insufficient documentation

## 2014-12-06 DIAGNOSIS — R519 Headache, unspecified: Secondary | ICD-10-CM

## 2014-12-06 DIAGNOSIS — Z85850 Personal history of malignant neoplasm of thyroid: Secondary | ICD-10-CM | POA: Insufficient documentation

## 2014-12-06 DIAGNOSIS — Y92009 Unspecified place in unspecified non-institutional (private) residence as the place of occurrence of the external cause: Secondary | ICD-10-CM | POA: Insufficient documentation

## 2014-12-06 DIAGNOSIS — R51 Headache: Secondary | ICD-10-CM

## 2014-12-06 DIAGNOSIS — S0512XA Contusion of eyeball and orbital tissues, left eye, initial encounter: Secondary | ICD-10-CM | POA: Insufficient documentation

## 2014-12-06 DIAGNOSIS — Z8742 Personal history of other diseases of the female genital tract: Secondary | ICD-10-CM | POA: Insufficient documentation

## 2014-12-06 DIAGNOSIS — Z8619 Personal history of other infectious and parasitic diseases: Secondary | ICD-10-CM | POA: Insufficient documentation

## 2014-12-06 DIAGNOSIS — S40021A Contusion of right upper arm, initial encounter: Secondary | ICD-10-CM | POA: Insufficient documentation

## 2014-12-06 MED ORDER — ONDANSETRON 4 MG PO TBDP: 4.0000 mg | ORAL_TABLET | Freq: Three times a day (TID) | ORAL | Status: DC | PRN

## 2014-12-06 MED ORDER — NAPROXEN 500 MG PO TABS: 500.0000 mg | ORAL_TABLET | Freq: Two times a day (BID) | ORAL | Status: DC

## 2014-12-06 MED ORDER — ONDANSETRON 4 MG PO TBDP
4.0000 mg | ORAL_TABLET | Freq: Once | ORAL | Status: AC
  Administered 2014-12-06: 4 mg via ORAL
  Filled 2014-12-06: qty 1

## 2014-12-06 MED ORDER — ACETAMINOPHEN 325 MG PO TABS
650.0000 mg | ORAL_TABLET | Freq: Once | ORAL | Status: AC
  Administered 2014-12-06: 650 mg via ORAL
  Filled 2014-12-06: qty 2

## 2014-12-06 NOTE — Discharge Instructions (Signed)
Take the prescribed medication as directed. °Follow-up with your primary care physician. °Return to the ED for new or worsening symptoms. ° °
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
## 2014-12-06 NOTE — ED Notes (Signed)
Patient transported to CT 
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
## 2015-04-23 ENCOUNTER — Encounter (HOSPITAL_COMMUNITY): Payer: Self-pay | Admitting: Emergency Medicine

## 2015-04-23 ENCOUNTER — Emergency Department (HOSPITAL_COMMUNITY)
Admission: EM | Admit: 2015-04-23 | Discharge: 2015-04-23 | Disposition: A | Discharge status: Home / Self Care | Payer: Self-pay | Attending: Emergency Medicine | Admitting: Emergency Medicine

## 2015-04-23 DIAGNOSIS — Z8709 Personal history of other diseases of the respiratory system: Secondary | ICD-10-CM | POA: Insufficient documentation

## 2015-04-23 DIAGNOSIS — Z760 Encounter for issue of repeat prescription: Secondary | ICD-10-CM | POA: Insufficient documentation

## 2015-04-23 DIAGNOSIS — Z85850 Personal history of malignant neoplasm of thyroid: Secondary | ICD-10-CM | POA: Insufficient documentation

## 2015-04-23 DIAGNOSIS — R5383 Other fatigue: Secondary | ICD-10-CM | POA: Insufficient documentation

## 2015-04-23 DIAGNOSIS — Z87440 Personal history of urinary (tract) infections: Secondary | ICD-10-CM | POA: Insufficient documentation

## 2015-04-23 DIAGNOSIS — Z791 Long term (current) use of non-steroidal anti-inflammatories (NSAID): Secondary | ICD-10-CM | POA: Insufficient documentation

## 2015-04-23 DIAGNOSIS — Z79899 Other long term (current) drug therapy: Secondary | ICD-10-CM | POA: Insufficient documentation

## 2015-04-23 DIAGNOSIS — Z720 Tobacco use: Secondary | ICD-10-CM | POA: Insufficient documentation

## 2015-04-23 DIAGNOSIS — Z8742 Personal history of other diseases of the female genital tract: Secondary | ICD-10-CM | POA: Insufficient documentation

## 2015-04-23 DIAGNOSIS — Z8619 Personal history of other infectious and parasitic diseases: Secondary | ICD-10-CM | POA: Insufficient documentation

## 2015-04-23 DIAGNOSIS — Z86018 Personal history of other benign neoplasm: Secondary | ICD-10-CM | POA: Insufficient documentation

## 2015-04-23 MED ORDER — LEVOTHYROXINE SODIUM 125 MCG PO TABS: 125.0000 ug | ORAL_TABLET | Freq: Every day | ORAL | Status: DC

## 2015-04-23 NOTE — ED Notes (Signed)
Patient here because she has ran out of her synthroid.  Patient would also like a referral to an Endocrinologist.  She does not have any insurance and needs help with finding an ENdocrinologist.

## 2015-04-23 NOTE — ED Notes (Signed)
Unable to locate x 1  

## 2015-04-23 NOTE — ED Provider Notes (Signed)
CSN: [id number]     Arrival date & time 04/23/15  1856 History  This chart was scribed for non-physician practitioner, Dahlia Bailiff, PA-C,working with Daleen Bo, MD, by Marlowe Kays, ED Scribe. This patient was seen in room TR04C/TR04C and the patient's care was started at 9:26 PM.  Chief Complaint  Patient presents with  . Medication Refill   The history is provided by the patient and medical records. No language interpreter was used.    HPI Comments:  Theresa Watkins is a 49 y.o. female who presents to the Emergency Department stating she is out of her Levothyroxine medication for the past five days. Patient is status post thyroidectomy. Pt reports increased fatigue. She states her pharmacy has tried to contact the endocrinologist unsuccessfully. She states she missed her last endocrinologist appt because her car broke down. She states she is currently trying to find a local provider because her current one is in [facility]. Pt does not have a current PCP. Patient reports mild fatigue, and denies having any other medical complaints.   Past Medical History  Diagnosis Date  . Headache(784.0)   . Seizures   . 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 Bronchitis   . Urinary tract infection   . Ovarian cyst   . Fibroid   . Abnormal Pap smear   . Trichomonas    Past Surgical History  Procedure Laterality Date  . Abdominal hysterectomy    . Laparoscopic  1988    removal  of ectopic preg, ruptured tube  . Therapeutic abortion    . Cesarean section    . Thyroidectomy  march 2011    cancer  . Goiter      removed   No family history on file. History  Substance Use Topics  . Smoking status: Current Every Day Smoker -- 0.50 packs/day for 25 years    Types: Cigarettes  . Smokeless tobacco: Never Used  . Alcohol Use: Yes     Comment: occ   OB History    Gravida Para Term Preterm AB TAB SAB Ectopic Multiple Living   3 1 1  0 2 1 0 1 0 1     Review of Systems  Constitutional: Positive for  fatigue.    Allergies  Aspirin; Iohexol; and Percocet  Home Medications   Prior to Admission medications   Medication Sig Start Date End Date Taking? Authorizing Provider  Calcium Carbonate-Vitamin D (CALCIUM + D PO) Take 2 tablets by mouth 2 (two) times daily.     Historical Provider, MD  levothyroxine (SYNTHROID, LEVOTHROID) 125 MCG tablet Take 1 tablet (125 mcg total) by mouth daily before breakfast. 04/23/15   Dahlia Bailiff, PA-C  naproxen (NAPROSYN) 500 MG tablet Take 1 tablet (500 mg total) by mouth 2 (two) times daily with a meal. 12/06/14   Larene Pickett, PA-C  ondansetron (ZOFRAN ODT) 4 MG disintegrating tablet Take 1 tablet (4 mg total) by mouth every 8 (eight) hours as needed for nausea. 12/06/14   Larene Pickett, PA-C   Triage Vitals: BP 144/92 mmHg  Pulse 67  Temp(Src) 97.9 F (36.6 C) (Oral)  Resp 18  Ht 5\' 5"  (1.651 m)  Wt 159 lb (72.122 kg)  BMI 26.46 kg/m2  SpO2 98% Physical Exam  Constitutional: She is oriented to person, place, and time. She appears well-developed and well-nourished.  HENT:  Head: Normocephalic and atraumatic.  Eyes: EOM are normal.  Neck: Normal range of motion.  Cardiovascular: Normal rate, regular rhythm and normal  heart sounds.  Exam reveals no gallop and no friction rub.   No murmur heard. Pulmonary/Chest: Effort normal and breath sounds normal. No respiratory distress. She has no wheezes. She has no rales.  Musculoskeletal: Normal range of motion.  Neurological: She is alert and oriented to person, place, and time.  Skin: Skin is warm and dry.  Psychiatric: She has a normal mood and affect. Her behavior is normal.  Nursing note and vitals reviewed.   ED Course  Procedures (including critical care time) DIAGNOSTIC STUDIES: Oxygen Saturation is 98% on RA, normal by my interpretation.   COORDINATION OF CARE: 9:34 PM- Will refer to [place] to follow up for primary care. Will refill prescription. Return precautions  discussed. Pt verbalizes understanding and agrees to plan.  Medications - No data to display  Labs Review Labs Reviewed - No data to display  Imaging Review No results found.   EKG Interpretation None      MDM   Final diagnoses:  Encounter for medication refill    Patient here for medication refill. Patient mild fatigue, no other complaints. Patient has normal exam, patient is afebrile, and hemodynamically stable and in no acute distress. Based on the fact the patient has status post thyroidectomy, and has no levothyroxine, we'll fill prescription. Strongly encouraged patient to follow up with [facility] tomorrow to help set up PCP. Return precautions discussed, patient verbalizes understanding and agreement of this plan.  I personally performed the services described in this documentation, which was scribed in my presence. The recorded information has been reviewed and is accurate.  BP 144/84 mmHg  Pulse 64  Temp(Src) 98.5 F (36.9 C) (Oral)  Resp 18  Ht 5\' 5"  (1.651 m)  Wt 159 lb (72.122 kg)  BMI 26.46 kg/m2  SpO2 98%  Signed,  Dahlia Bailiff, PA-C 2:47 AM     Dahlia Bailiff, PA-C 04/25/15 [id number]  Daleen Bo, MD 05/01/15 1944

## 2015-04-23 NOTE — Discharge Instructions (Signed)
Medication Refill, Emergency Department °We have refilled your medication today as a courtesy to you. It is best for your medical care, however, to take care of getting refills done through your primary caregiver's office. They have your records and can do a better job of follow-up than we can in the emergency department. °On maintenance medications, we often only prescribe enough medications to get you by until you are able to see your regular caregiver. This is a more expensive way to refill medications. °In the future, please plan for refills so that you will not have to use the emergency department for this. °Thank you for your help. Your help allows us to better take care of the daily emergencies that enter our department. °Document Released: 02/21/2004 Document Revised: 01/27/2012 Document Reviewed: 02/11/2014 °Exit[employer]® Patient Information ©2015 [employer]. This information is not intended to replace advice given to you by your health care provider. Make sure you discuss any questions you have with your health care provider. ° °

## 2015-04-23 NOTE — ED Notes (Signed)
Pt requesting help with getting medication refilled due to lack of insurance. Pt has been out of synthroid meds. Denies complaints

## 2015-06-10 ENCOUNTER — Emergency Department (HOSPITAL_COMMUNITY)
Admission: EM | Admit: 2015-06-10 | Discharge: 2015-06-10 | Disposition: A | Discharge status: Home / Self Care | Payer: Self-pay | Attending: Emergency Medicine | Admitting: Emergency Medicine

## 2015-06-10 ENCOUNTER — Encounter (HOSPITAL_COMMUNITY): Payer: Self-pay | Admitting: Emergency Medicine

## 2015-06-10 DIAGNOSIS — Z86018 Personal history of other benign neoplasm: Secondary | ICD-10-CM | POA: Insufficient documentation

## 2015-06-10 DIAGNOSIS — Z87440 Personal history of urinary (tract) infections: Secondary | ICD-10-CM | POA: Insufficient documentation

## 2015-06-10 DIAGNOSIS — E039 Hypothyroidism, unspecified: Secondary | ICD-10-CM | POA: Insufficient documentation

## 2015-06-10 DIAGNOSIS — Z760 Encounter for issue of repeat prescription: Secondary | ICD-10-CM | POA: Insufficient documentation

## 2015-06-10 DIAGNOSIS — Z791 Long term (current) use of non-steroidal anti-inflammatories (NSAID): Secondary | ICD-10-CM | POA: Insufficient documentation

## 2015-06-10 DIAGNOSIS — Z85850 Personal history of malignant neoplasm of thyroid: Secondary | ICD-10-CM | POA: Insufficient documentation

## 2015-06-10 DIAGNOSIS — Z79899 Other long term (current) drug therapy: Secondary | ICD-10-CM | POA: Insufficient documentation

## 2015-06-10 DIAGNOSIS — Z8619 Personal history of other infectious and parasitic diseases: Secondary | ICD-10-CM | POA: Insufficient documentation

## 2015-06-10 DIAGNOSIS — Z8742 Personal history of other diseases of the female genital tract: Secondary | ICD-10-CM | POA: Insufficient documentation

## 2015-06-10 DIAGNOSIS — Z720 Tobacco use: Secondary | ICD-10-CM | POA: Insufficient documentation

## 2015-06-10 DIAGNOSIS — Z8709 Personal history of other diseases of the respiratory system: Secondary | ICD-10-CM | POA: Insufficient documentation

## 2015-06-10 MED ORDER — LEVOTHYROXINE SODIUM 125 MCG PO TABS: 125.0000 ug | ORAL_TABLET | Freq: Every day | ORAL | Status: DC

## 2015-06-10 NOTE — ED Notes (Signed)
Pt here for refill of synthroid medication 125 MCG taken once a day. Pt has been out of the medication for 2 days.

## 2015-06-10 NOTE — ED Provider Notes (Signed)
CSN: [id number]     Arrival date & time 06/10/15  0719 History   First MD Initiated Contact with Patient 06/10/15 0732     Chief Complaint  Patient presents with  . Medication Refill     (Consider location/radiation/quality/duration/timing/severity/associated sxs/prior Treatment) The history is provided by the patient.  Theresa Watkins is a 49 y.o. female hx of hypothyroidism s/p thyroidectomy from cancer here with medication refill. Ran out of synthroid 2 days ago. Has been feeling tired but denies chest pain, fever, ab pain. [facility] has appointment [facility] in [facility] month.    Past Medical History  Diagnosis Date  . Headache(784.0)   . Seizures   . 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 Bronchitis   . Urinary tract infection   . Ovarian cyst   . Fibroid   . Abnormal Pap smear   . Trichomonas    Past Surgical History  Procedure Laterality Date  . Abdominal hysterectomy    . Laparoscopic  1988    removal  of ectopic preg, ruptured tube  . Therapeutic abortion    . Cesarean section    . Thyroidectomy  march 2011    cancer  . Goiter      removed   No family history on file. History  Substance Use Topics  . Smoking status: Current Every Day Smoker -- 0.50 packs/day for 25 years    Types: Cigarettes  . Smokeless tobacco: Never Used  . Alcohol Use: Yes     Comment: occ   OB History    Gravida Para Term Preterm AB TAB SAB Ectopic Multiple Living   3 1 1  0 2 1 0 1 0 1     Review of Systems  Constitutional:       Tired  All other systems reviewed and are negative.     Allergies  Aspirin; Iohexol; and Percocet  Home Medications   Prior to Admission medications   Medication Sig Start Date End Date Taking? Authorizing Provider  Calcium Carbonate-Vitamin D (CALCIUM + D PO) Take 2 tablets by mouth 2 (two) times daily.     Historical Provider, MD  levothyroxine (SYNTHROID, LEVOTHROID) 125 MCG tablet Take 1 tablet (125 mcg total) by mouth daily before breakfast. 04/23/15    Dahlia Bailiff, PA-C  naproxen (NAPROSYN) 500 MG tablet Take 1 tablet (500 mg total) by mouth 2 (two) times daily with a meal. 12/06/14   Larene Pickett, PA-C  ondansetron (ZOFRAN ODT) 4 MG disintegrating tablet Take 1 tablet (4 mg total) by mouth every 8 (eight) hours as needed for nausea. 12/06/14   Larene Pickett, PA-C   BP 151/93 mmHg  Pulse 82  Temp(Src) 98.5 F (36.9 C) (Oral)  Resp 19  Ht 5' 5.5" (1.664 m)  Wt 168 lb (76.204 kg)  BMI 27.52 kg/m2  SpO2 97% Physical Exam  Constitutional: She is oriented to person, place, and time. She appears well-developed and well-nourished.  HENT:  Head: Normocephalic.  Mouth/Throat: Oropharynx is clear and moist.  Eyes: Conjunctivae are normal. Pupils are equal, round, and reactive to light.  Neck: Normal range of motion. Neck supple.  No obvious thyroid nodules   Cardiovascular: Normal rate, regular rhythm and normal heart sounds.   Pulmonary/Chest: Effort normal and breath sounds normal. No respiratory distress. She has no wheezes. She has no rales.  Abdominal: Soft. Bowel sounds are normal. She exhibits no distension. There is no tenderness. There is no rebound.  Musculoskeletal: Normal range of motion. She  exhibits no edema or tenderness.  Neurological: She is alert and oriented to person, place, and time.  Skin: Skin is warm and dry.  Psychiatric: She has a normal mood and affect. Her behavior is normal. Judgment and thought content normal.  Nursing note and vitals reviewed.   ED Course  Procedures (including critical care time) Labs Review Labs Reviewed - No data to display  Imaging Review No results found.   EKG Interpretation None      MDM   Final diagnoses:  None    Theresa Watkins is a 49 y.o. female here with medication refill. Well appearing, not in myxedema coma and only missed 2 days. Will prescribe home dose of synthroid. Will not need to check labs.    Wandra Arthurs, MD 06/10/15 [phone]

## 2015-06-10 NOTE — Discharge Instructions (Signed)
Take synthroid as prescribed.   See your doctor.   Return to ER if you are more tired than usual, fever, lethargy.

## 2015-12-24 ENCOUNTER — Emergency Department (HOSPITAL_COMMUNITY): Payer: Medicaid Other

## 2015-12-24 ENCOUNTER — Inpatient Hospital Stay (HOSPITAL_COMMUNITY)
Admission: EM | Admit: 2015-12-24 | Discharge: 2015-12-27 | DRG: 064 | Disposition: A | Discharge status: Rehabilitation Facility | Payer: Medicaid Other | Attending: Internal Medicine | Admitting: Internal Medicine

## 2015-12-24 ENCOUNTER — Encounter (HOSPITAL_COMMUNITY): Payer: Self-pay | Admitting: Emergency Medicine

## 2015-12-24 DIAGNOSIS — I63312 Cerebral infarction due to thrombosis of left middle cerebral artery: Secondary | ICD-10-CM | POA: Diagnosis not present

## 2015-12-24 DIAGNOSIS — E038 Other specified hypothyroidism: Secondary | ICD-10-CM | POA: Diagnosis not present

## 2015-12-24 DIAGNOSIS — I639 Cerebral infarction, unspecified: Secondary | ICD-10-CM | POA: Diagnosis present

## 2015-12-24 DIAGNOSIS — R296 Repeated falls: Secondary | ICD-10-CM | POA: Diagnosis present

## 2015-12-24 DIAGNOSIS — R29810 Facial weakness: Secondary | ICD-10-CM | POA: Diagnosis present

## 2015-12-24 DIAGNOSIS — F17210 Nicotine dependence, cigarettes, uncomplicated: Secondary | ICD-10-CM | POA: Diagnosis present

## 2015-12-24 DIAGNOSIS — I610 Nontraumatic intracerebral hemorrhage in hemisphere, subcortical: Secondary | ICD-10-CM | POA: Diagnosis present

## 2015-12-24 DIAGNOSIS — Z91041 Radiographic dye allergy status: Secondary | ICD-10-CM | POA: Diagnosis not present

## 2015-12-24 DIAGNOSIS — E785 Hyperlipidemia, unspecified: Secondary | ICD-10-CM | POA: Diagnosis present

## 2015-12-24 DIAGNOSIS — R29708 NIHSS score 8: Secondary | ICD-10-CM | POA: Diagnosis present

## 2015-12-24 DIAGNOSIS — Z823 Family history of stroke: Secondary | ICD-10-CM

## 2015-12-24 DIAGNOSIS — I10 Essential (primary) hypertension: Secondary | ICD-10-CM | POA: Diagnosis present

## 2015-12-24 DIAGNOSIS — F1010 Alcohol abuse, uncomplicated: Secondary | ICD-10-CM | POA: Insufficient documentation

## 2015-12-24 DIAGNOSIS — E039 Hypothyroidism, unspecified: Secondary | ICD-10-CM

## 2015-12-24 DIAGNOSIS — Z720 Tobacco use: Secondary | ICD-10-CM | POA: Insufficient documentation

## 2015-12-24 DIAGNOSIS — Z7982 Long term (current) use of aspirin: Secondary | ICD-10-CM

## 2015-12-24 DIAGNOSIS — Z9181 History of falling: Secondary | ICD-10-CM | POA: Diagnosis not present

## 2015-12-24 DIAGNOSIS — F1410 Cocaine abuse, uncomplicated: Secondary | ICD-10-CM | POA: Diagnosis present

## 2015-12-24 DIAGNOSIS — R471 Dysarthria and anarthria: Secondary | ICD-10-CM | POA: Diagnosis not present

## 2015-12-24 DIAGNOSIS — IMO0002 Reserved for concepts with insufficient information to code with codable children: Secondary | ICD-10-CM | POA: Insufficient documentation

## 2015-12-24 DIAGNOSIS — K5900 Constipation, unspecified: Secondary | ICD-10-CM | POA: Diagnosis present

## 2015-12-24 DIAGNOSIS — I4581 Long QT syndrome: Secondary | ICD-10-CM | POA: Diagnosis not present

## 2015-12-24 DIAGNOSIS — I6789 Other cerebrovascular disease: Secondary | ICD-10-CM | POA: Diagnosis not present

## 2015-12-24 DIAGNOSIS — Z8719 Personal history of other diseases of the digestive system: Secondary | ICD-10-CM | POA: Diagnosis not present

## 2015-12-24 DIAGNOSIS — D751 Secondary polycythemia: Secondary | ICD-10-CM | POA: Diagnosis present

## 2015-12-24 DIAGNOSIS — R079 Chest pain, unspecified: Secondary | ICD-10-CM | POA: Diagnosis not present

## 2015-12-24 DIAGNOSIS — I69359 Hemiplegia and hemiparesis following cerebral infarction affecting unspecified side: Secondary | ICD-10-CM | POA: Diagnosis not present

## 2015-12-24 DIAGNOSIS — I63512 Cerebral infarction due to unspecified occlusion or stenosis of left middle cerebral artery: Principal | ICD-10-CM | POA: Diagnosis present

## 2015-12-24 DIAGNOSIS — E890 Postprocedural hypothyroidism: Secondary | ICD-10-CM | POA: Diagnosis present

## 2015-12-24 DIAGNOSIS — Z85850 Personal history of malignant neoplasm of thyroid: Secondary | ICD-10-CM | POA: Diagnosis not present

## 2015-12-24 DIAGNOSIS — G8191 Hemiplegia, unspecified affecting right dominant side: Secondary | ICD-10-CM | POA: Diagnosis present

## 2015-12-24 DIAGNOSIS — R4701 Aphasia: Secondary | ICD-10-CM | POA: Diagnosis present

## 2015-12-24 DIAGNOSIS — Z885 Allergy status to narcotic agent status: Secondary | ICD-10-CM | POA: Diagnosis not present

## 2015-12-24 DIAGNOSIS — Q211 Atrial septal defect: Secondary | ICD-10-CM

## 2015-12-24 LAB — CBC
HEMATOCRIT: 46.1 % — AB (ref 36.0–46.0)
Hemoglobin: 15.8 g/dL — ABNORMAL HIGH (ref 12.0–15.0)
MCH: 29.3 pg (ref 26.0–34.0)
MCHC: 34.3 g/dL (ref 30.0–36.0)
MCV: 85.5 fL (ref 78.0–100.0)
Platelets: 181 10*3/uL (ref 150–400)
RBC: 5.39 MIL/uL — ABNORMAL HIGH (ref 3.87–5.11)
RDW: 13.4 % (ref 11.5–15.5)
WBC: 6.9 10*3/uL (ref 4.0–10.5)

## 2015-12-24 LAB — COMPREHENSIVE METABOLIC PANEL
ALT: 12 U/L — AB (ref 14–54)
AST: 18 U/L (ref 15–41)
Albumin: 4.2 g/dL (ref 3.5–5.0)
Alkaline Phosphatase: 65 U/L (ref 38–126)
Anion gap: 14 (ref 5–15)
BUN: 16 mg/dL (ref 6–20)
CHLORIDE: 109 mmol/L (ref 101–111)
CO2: 21 mmol/L — ABNORMAL LOW (ref 22–32)
CREATININE: 0.91 mg/dL (ref 0.44–1.00)
Calcium: 9.2 mg/dL (ref 8.9–10.3)
Glucose, Bld: 89 mg/dL (ref 65–99)
POTASSIUM: 4 mmol/L (ref 3.5–5.1)
Sodium: 144 mmol/L (ref 135–145)
TOTAL PROTEIN: 7.3 g/dL (ref 6.5–8.1)
Total Bilirubin: 0.6 mg/dL (ref 0.3–1.2)

## 2015-12-24 LAB — RAPID URINE DRUG SCREEN, HOSP PERFORMED
Amphetamines: NOT DETECTED
BENZODIAZEPINES: NOT DETECTED
Barbiturates: NOT DETECTED
Cocaine: POSITIVE — AB
Opiates: NOT DETECTED
Tetrahydrocannabinol: NOT DETECTED

## 2015-12-24 LAB — APTT: aPTT: 29 seconds (ref 24–37)

## 2015-12-24 LAB — DIFFERENTIAL
BASOS PCT: 0 %
Basophils Absolute: 0 10*3/uL (ref 0.0–0.1)
EOS ABS: 0.1 10*3/uL (ref 0.0–0.7)
Eosinophils Relative: 1 %
Lymphocytes Relative: 29 %
Lymphs Abs: 2 10*3/uL (ref 0.7–4.0)
MONO ABS: 0.5 10*3/uL (ref 0.1–1.0)
MONOS PCT: 7 %
Neutro Abs: 4.3 10*3/uL (ref 1.7–7.7)
Neutrophils Relative %: 63 %

## 2015-12-24 LAB — I-STAT CHEM 8, ED
BUN: 18 mg/dL (ref 6–20)
CALCIUM ION: 1.08 mmol/L — AB (ref 1.12–1.23)
Chloride: 108 mmol/L (ref 101–111)
Creatinine, Ser: 0.9 mg/dL (ref 0.44–1.00)
GLUCOSE: 84 mg/dL (ref 65–99)
HCT: 52 % — ABNORMAL HIGH (ref 36.0–46.0)
HEMOGLOBIN: 17.7 g/dL — AB (ref 12.0–15.0)
Potassium: 3.8 mmol/L (ref 3.5–5.1)
Sodium: 142 mmol/L (ref 135–145)
TCO2: 24 mmol/L (ref 0–100)

## 2015-12-24 LAB — URINALYSIS, ROUTINE W REFLEX MICROSCOPIC
Bilirubin Urine: NEGATIVE
Glucose, UA: NEGATIVE mg/dL
Ketones, ur: 15 mg/dL — AB
Nitrite: NEGATIVE
Protein, ur: NEGATIVE mg/dL
SPECIFIC GRAVITY, URINE: 1.024 (ref 1.005–1.030)
pH: 5.5 (ref 5.0–8.0)

## 2015-12-24 LAB — I-STAT TROPONIN, ED: TROPONIN I, POC: 0 ng/mL (ref 0.00–0.08)

## 2015-12-24 LAB — ETHANOL

## 2015-12-24 LAB — URINE MICROSCOPIC-ADD ON

## 2015-12-24 LAB — PROTIME-INR
INR: 0.96 (ref 0.00–1.49)
Prothrombin Time: 13 seconds (ref 11.6–15.2)

## 2015-12-24 MED ORDER — STROKE: EARLY STAGES OF RECOVERY BOOK
Freq: Once | Status: AC
  Administered 2015-12-25: 09:00:00
  Filled 2015-12-24: qty 1

## 2015-12-24 MED ORDER — NICOTINE 7 MG/24HR TD PT24
7.0000 mg | MEDICATED_PATCH | Freq: Every day | TRANSDERMAL | Status: DC
  Administered 2015-12-25 – 2015-12-27 (×3): 7 mg via TRANSDERMAL
  Filled 2015-12-24 (×3): qty 1

## 2015-12-24 MED ORDER — ACETAMINOPHEN 325 MG PO TABS
650.0000 mg | ORAL_TABLET | ORAL | Status: DC | PRN
  Administered 2015-12-26: 650 mg via ORAL
  Filled 2015-12-24: qty 2

## 2015-12-24 MED ORDER — SENNOSIDES-DOCUSATE SODIUM 8.6-50 MG PO TABS: 1.0000 | ORAL_TABLET | Freq: Every evening | ORAL | Status: DC | PRN

## 2015-12-24 MED ORDER — ASPIRIN EC 325 MG PO TBEC
325.0000 mg | DELAYED_RELEASE_TABLET | Freq: Every day | ORAL | Status: DC
  Administered 2015-12-25 – 2015-12-27 (×3): 325 mg via ORAL
  Filled 2015-12-24 (×3): qty 1

## 2015-12-24 MED ORDER — LEVOTHYROXINE SODIUM 25 MCG PO TABS
125.0000 ug | ORAL_TABLET | Freq: Every day | ORAL | Status: DC
  Administered 2015-12-25 – 2015-12-27 (×3): 125 ug via ORAL
  Filled 2015-12-24 (×4): qty 1

## 2015-12-24 MED ORDER — ACETAMINOPHEN 650 MG RE SUPP: 650.0000 mg | RECTAL | Status: DC | PRN

## 2015-12-24 MED ORDER — ENOXAPARIN SODIUM 40 MG/0.4ML ~~LOC~~ SOLN
40.0000 mg | Freq: Every day | SUBCUTANEOUS | Status: DC
  Administered 2015-12-24 – 2015-12-26 (×3): 40 mg via SUBCUTANEOUS
  Filled 2015-12-24 (×2): qty 0.4

## 2015-12-24 NOTE — ED Notes (Signed)
Gave Pt sandwich. 

## 2015-12-24 NOTE — ED Notes (Signed)
Pt became upset when this nurse started to ask her questions regarding reason for being in the ED.  Pt states I want to call the ambulance so that I can go back there to be seen.  She states "if I called the ambulance, they wouldn't bring me to triage."  Pt made aware that calling the ambulance does not always by pass triage.  Pt got up from the wheelchair, with steady gait, yelling, and spitting.  States "look at my record" when asked questions.  R facial droop noted.  No one-sided weakness noted.  [facility] ED[facility] at bedside.

## 2015-12-24 NOTE — ED Notes (Signed)
[facility] called--- report on pt provided. 

## 2015-12-24 NOTE — ED Notes (Signed)
[facility] was notified of pt's transfer to [facility]. 

## 2015-12-24 NOTE — ED Notes (Signed)
[facility] here to transfer pt to [facility]. 

## 2015-12-24 NOTE — H&P (Signed)
History and Physical  Patient Name: Theresa Watkins     [id number]    DOB: 10-Apr-1966    DOA: 12/24/2015 Referring physician: Blanchie Dessert, MD PCP: No PCP Per Patient      Chief Complaint: Right sided facial droop  HPI: Theresa Watkins is a 50 y.o. female with a past medical history significant for hypothyroidism who presents with right sided weakness and facial droop.  The patient was in her usual state of health until yesterday morning, she used cocaine with a friend, and later in the evening noticed intermittent right-sided facial droop and numbness. She also fell, which is unusual for her. Her friends thought she was drunk, and teased her, so she left, went to another friend's house, had a shot of alcohol, and then went home.  While driving home she noticed 2 or 3 times that she had no control of her right leg, and accelerated out of control. Her symptoms had mostly resolved by the time she went to sleep at 3 AM.  She woke at 11 AM, and felt unsteady, but went back to sleep until 4 PM. When she woke up at 4 PM she had dense right-sided facial droop, drooling, right facial numbness, and right arm weakness, so she went to the ER.  In the ED, the patient was hemodynamically stable but had right-sided facial droop and right arm weakness.  Stroke code was not called because the patient had presented outside the TPA window.  K3.8, HCO3 24, Cr 0.9, WBC 6.8, and globin 15.8, troponin negative. CT of the head without contrast was unremarkable. An ECG shows sinus rhythm without ST changes. The patient was discussed with neurology who recommended stroke workup and transfer to [facility].  The patient reports a "mini stroke" years ago. She smokes socially. She takes a baby aspirin frequently. She does not have a regular PCP.   Review of Systems:  Pt complains of fall, right arm weakness, right-sided facial numbness right, right-sided facial droop, drooling. All other systems negative except as just  noted or noted in the history of present illness.  Allergies  Allergen Reactions  . Iohexol      Code: HIVES, Desc: pts tongue began itching post injection and throat burning, Onset Date: [id number]   . Percocet [Oxycodone-Acetaminophen] Rash    Prior to Admission medications   Medication Sig Start Date End Date Taking? Authorizing Provider  aspirin 81 MG tablet Take 81 mg by mouth daily.   Yes Historical Provider, MD  Calcium Carbonate-Vitamin D (CALCIUM + D PO) Take 2 tablets by mouth 2 (two) times daily.    Yes Historical Provider, MD  levothyroxine (SYNTHROID, LEVOTHROID) 125 MCG tablet Take 1 tablet (125 mcg total) by mouth daily before breakfast. 06/10/15  Yes Wandra Arthurs, MD  naproxen (NAPROSYN) 500 MG tablet Take 1 tablet (500 mg total) by mouth 2 (two) times daily with a meal. Patient not taking: Reported on 12/24/2015 12/06/14   Larene Pickett, PA-C  ondansetron (ZOFRAN ODT) 4 MG disintegrating tablet Take 1 tablet (4 mg total) by mouth every 8 (eight) hours as needed for nausea. Patient not taking: Reported on 12/24/2015 12/06/14   Kathryne Hitch    Past Medical History  Diagnosis Date  . Headache(784.0)   . Seizures ([place])   . Thyroid cancer ([place]) 20011  . Bronchitis   . Urinary tract infection   . Ovarian cyst   . Fibroid   . Abnormal Pap smear   . Trichomonas  Past Surgical History  Procedure Laterality Date  . Abdominal hysterectomy    . Laparoscopic  1988    removal  of ectopic preg, ruptured tube  . Therapeutic abortion    . Cesarean section    . Thyroidectomy  march 2011    cancer  . Goiter      removed    Family history: family history includes Bell's palsy in her mother; Cancer in her father; Stroke in her father.  Social History: Patient lives alone, has cats.  She smokes socially.  Cocaine use also occasionally.  She is indepdent with all IADLs and ADLs at baseline.     Physical Exam: BP 151/97 mmHg  Pulse 54  Temp(Src) 97.8 F (36.6 C)  (Oral)  Resp 16  SpO2 100% General appearance: Well-developed, adult female, alert and in no acute distress.  Drooling.   Eyes: Anicteric, conjunctiva pink, lids and lashes normal.     ENT: No nasal deformity, discharge, or epistaxis.  OP moist without lesions.   Skin: Warm and dry.  No jaundice.  No suspicious rashes or lesions. Cardiac: Bradycardia, regular, nl S1-S2, no murmurs appreciated.  Capillary refill is brisk.  No JVD.  No LE edema.  Radial and DP pulses 2+ and symmetric. Respiratory: Normal respiratory rate and rhythm.  CTAB without rales or wheezes. Abdomen: Abdomen soft without rigidity.  No TTP. No ascites, distension.   MSK: No deformities or effusions. Neuro: Pupils are 4 mm and reactive to 3 mm. Extraocular movements are intact, without nystagmus. There is numbess around right lower face. Cranial nerve 7 shows right sided facial droop. Cranial nerve 8 is within normal limits. Cranial nerves 9 and 10 reveal equal palate elevation. Cranial nerve 11 reveals right sternocleidomastoid weak. Cranial nerve 12 is midline. There is inability to lift the right arm against gravity.  Normal in left arm.  I do not note a deficit in motor strength testing in the lower extremities bilaterally with normal motor, tone and bulk. Finger-to-nose testing limited by right arm weakness. The patient is oriented to time, place and person. Speech is slurred. Naming is grossly intact. Recall, recent and remote, as well as general fund of knowledge seem within normal limits. Attention span and concentration are within normal limits.   Psych: Behavior appropriate.  Affect normal.  No evidence of aural or visual hallucinations or delusions.       Labs on Admission:  The metabolic panel shows normal sodium, potassium, bicarbonate, and renal function The troponin is normal. The complete blood counts shows normal CBC, mild erythrocytosis, and platelet count.   Radiological Exams on Admission: CT  head without contrast 12/24/2015  NAICP    EKG: Independently reviewed. Rate 63, QTc long at 500, RAE and no ST changes.    Assessment/Plan  1. Acute Stroke:  This is new.  Stroke clinically with right arm weakness and facial droop.  Passed swallow screen.  To [facility] for expedited MRI and stroke rehab. -Admit to telemetry -Neuro checks, NIHSS per protocol -Daily aspirin enteric coated 325 mg (patient does not tolerate normal aspirin) -Lipids, hemoglobin A1c -Carotid doppler, MRI/MRA ordered -Echocardiogram ordered -PT/OT/SLP consultation -Consult to Neurology, appreciate recommendations -UA and UDS pending  2. Hypothyroidism:  S/p thyroidectomy for thyroid cancer.  Patient without recent PCP follow up. Continue home levothyroxine.  Check TSH  3. Smoking:  Counseled complete cessation. -Nicotine replacement, low dose  4. Erythrocytosis: Suspect hemoconcentration. Repeat CBC after fluids    DVT PPx: Lovenox Diet: Heart healthy,  passed swallow screen Consultants: Neurology Code Status: Full Family Communication: The diagnosis and expected plan of care were dsicussed.  All questions were answered.    Medical decision making: Patient seen 9:00 PM on 12/24/2015.  What exists of the patient's electronic chart were reviewed and the case was discussed with Dr. Maryan Rued.   Disposition Plan:  Will admit for suspected stroke.  MRI and then stroke work up as above and consult to ancillary services.  Expect discharge within 2-3 days.     Edwin Dada [facility] Pager [phone]

## 2015-12-24 NOTE — ED Provider Notes (Signed)
CSN: [id number]     Arrival date & time 12/24/15  1848 History   First MD Initiated Contact with Patient 12/24/15 1902     Chief Complaint  Patient presents with  . Facial Droop  . Extremity Weakness  . Aphasia     (Consider location/radiation/quality/duration/timing/severity/associated sxs/prior Treatment) HPI Comments: Patient is a 50 year old female with a thyroid disorder who states she had a fall last night which she is not sure why but then when she woke up this afternoon she had drooping of the right side of her face and weakness in her arm and leg. Patient says she has a history of stroke in the past however no documentation found in our records. Patient denies a history of hypertension or diabetes. She cannot give a definitive time when she was last normal. The friend who is with her said that she last saw her normal week ago. Patient denies any recent medication changes or drug use. Patient denies any alcohol use. Patient does have a history of seizure or migraines and states for the last 3 days she had a headache but yesterday the headache resolved and she was feeling better until today when she woke up with the symptoms. She currently denies headache and denies any recent seizure activity.  Patient is a 50 y.o. female presenting with weakness. The history is provided by the patient.  Weakness This is a new problem. The current episode started 12 to 24 hours ago. The problem occurs constantly. The problem has been gradually worsening. Associated symptoms comments: Right facial droop, drooling, speech difficulty, right arm and leg weakness resulting in one fall last night.. Nothing aggravates the symptoms. Nothing relieves the symptoms. She has tried nothing for the symptoms. The treatment provided no relief.    Past Medical History  Diagnosis Date  . Headache(784.0)   . Seizures ([place])   . Thyroid cancer ([place]) 20011  . Bronchitis   . Urinary tract infection   . Ovarian cyst   .  Fibroid   . Abnormal Pap smear   . Trichomonas    Past Surgical History  Procedure Laterality Date  . Abdominal hysterectomy    . Laparoscopic  1988    removal  of ectopic preg, ruptured tube  . Therapeutic abortion    . Cesarean section    . Thyroidectomy  march 2011    cancer  . Goiter      removed   History reviewed. No pertinent family history. Social History  Substance Use Topics  . Smoking status: Current Every Day Smoker -- 0.50 packs/day for 25 years    Types: Cigarettes  . Smokeless tobacco: Never Used  . Alcohol Use: Yes     Comment: occ   OB History    Gravida Para Term Preterm AB TAB SAB Ectopic Multiple Living   3 1 1  0 2 1 0 1 0 1     Review of Systems  Neurological: Positive for weakness.  All other systems reviewed and are negative.     Allergies  Aspirin; Iohexol; and Percocet  Home Medications   Prior to Admission medications   Medication Sig Start Date End Date Taking? Authorizing Provider  aspirin 81 MG tablet Take 81 mg by mouth daily.   Yes Historical Provider, MD  Calcium Carbonate-Vitamin D (CALCIUM + D PO) Take 2 tablets by mouth 2 (two) times daily.    Yes Historical Provider, MD  levothyroxine (SYNTHROID, LEVOTHROID) 125 MCG tablet Take 1 tablet (125 mcg total) by  mouth daily before breakfast. 06/10/15  Yes Wandra Arthurs, MD  naproxen (NAPROSYN) 500 MG tablet Take 1 tablet (500 mg total) by mouth 2 (two) times daily with a meal. Patient not taking: Reported on 12/24/2015 12/06/14   Larene Pickett, PA-C  ondansetron (ZOFRAN ODT) 4 MG disintegrating tablet Take 1 tablet (4 mg total) by mouth every 8 (eight) hours as needed for nausea. Patient not taking: Reported on 12/24/2015 12/06/14   Larene Pickett, PA-C   BP 151/97 mmHg  Pulse 54  Temp(Src) 97.8 F (36.6 C) (Oral)  Resp 16  SpO2 100% Physical Exam  Constitutional: She is oriented to person, place, and time. She appears well-developed and well-nourished. No distress.  HENT:  Head:  Normocephalic and atraumatic.  Mouth/Throat: Oropharynx is clear and moist.  Eyes: Conjunctivae and EOM are normal. Pupils are equal, round, and reactive to light.  Neck: Normal range of motion. Neck supple.  Cardiovascular: Normal rate, regular rhythm and intact distal pulses.   No murmur heard. Pulmonary/Chest: Effort normal and breath sounds normal. No respiratory distress. She has no wheezes. She has no rales.  Abdominal: Soft. She exhibits no distension. There is no tenderness. There is no rebound and no guarding.  Musculoskeletal: Normal range of motion. She exhibits no edema or tenderness.  Neurological: She is alert and oriented to person, place, and time. She has normal strength. A cranial nerve deficit and sensory deficit is present. Coordination normal.  Right-sided facial droop with drooling and mild slurred speech. 2 out of 5 strength in the right upper extremity and 4 out of 5 strength in the right lower extremity. Mild decreased sensation in the face, upper and lower extremity. Left upper and lower extremity 5 out of 5. Patient not ambulated due to weakness. Normal finger to nose testing. No noted visual field cuts.  Skin: Skin is warm and dry. No rash noted. No erythema.  Psychiatric: She has a normal mood and affect. Her behavior is normal.  Nursing note and vitals reviewed.   ED Course  Procedures (including critical care time) Labs Review Labs Reviewed  CBC - Abnormal; Notable for the following:    RBC 5.39 (*)    Hemoglobin 15.8 (*)    HCT 46.1 (*)    All other components within normal limits  COMPREHENSIVE METABOLIC PANEL - Abnormal; Notable for the following:    CO2 21 (*)    ALT 12 (*)    All other components within normal limits  I-STAT CHEM 8, ED - Abnormal; Notable for the following:    Calcium, Ion 1.08 (*)    Hemoglobin 17.7 (*)    HCT 52.0 (*)    All other components within normal limits  ETHANOL  PROTIME-INR  APTT  DIFFERENTIAL  URINE RAPID DRUG  SCREEN, HOSP PERFORMED  URINALYSIS, ROUTINE W REFLEX MICROSCOPIC (NOT AT [facility])  I-STAT TROPOININ, ED    Imaging Review Ct Head Wo Contrast  12/24/2015  CLINICAL DATA:  Right-sided weakness, multiple falls x 48 hours EXAM: CT HEAD WITHOUT CONTRAST TECHNIQUE: Contiguous axial images were obtained from the base of the skull through the vertex without intravenous contrast. COMPARISON:  CT head dated 12/06/2014 FINDINGS: No evidence of parenchymal hemorrhage or extra-axial fluid collection. No mass lesion, mass effect, or midline shift. No CT evidence of acute infarction. Cerebral volume is within normal limits.  No ventriculomegaly. The visualized paranasal sinuses are essentially clear. The mastoid air cells are unopacified. No evidence of calvarial fracture. IMPRESSION: Normal head  CT. Electronically Signed   By: Julian Hy M.D.   On: 12/24/2015 20:00   I have personally reviewed and evaluated these images and lab results as part of my medical decision-making.   EKG Interpretation   Date/Time:  Sunday December 24 2015 19:21:20 EST Ventricular Rate:  63 PR Interval:  142 QRS Duration: 98 QT Interval:  499 QTC Calculation: 511 R Axis:   0 Text Interpretation:  Sinus rhythm Consider right atrial enlargement  Prolonged QT interval No significant change since last tracing Confirmed  by Maryan Rued  MD, Loree Fee ([id number]) on 12/24/2015 7:28:23 PM      MDM   Final diagnoses:  Cerebral infarction due to unspecified mechanism   Patient is a 50 year old female with a history of thyroid disease presenting today with right-sided facial droop and upper and lower extremity weakness.  Patient recalls that she had a fall last night but cannot give a time when last normal. The friend who is with the patient states she lost saw her normal one week ago. Patient states she has a history of stroke however unable to find documentation of this.  Patient does have a history of headaches and seizure however  denies any seizure activity and does not appear to be having a seizure at this time. Also patient is currently headache free and low suspicion that this is complicated migraine.  Head CT is negative.  Chem 8, troponin, EKG, CBC and CMP are without acute findings. Code stroke order set ordered. Alcohol is less than 5. Patient's deficits persist. Discussed with Dr. Nicole Kindred who recommended the patient be transferred to [facility] for further evaluation.    Blanchie Dessert, MD 12/24/15 2149

## 2015-12-24 NOTE — ED Notes (Signed)
Pt states she fell a few times yesterday, felt weak in her right arm, speech is slurred, right side facial droop onset yesterday. Pt states some new symptoms occurred today. As this RN questioned patient's about onset of each symptom, pt became increasing agitated, stating she needs to "see someone." This RN explained to patient that we need to know what symptoms began when. Pt continued to demand to see doctor. This RN continued to question about which symptoms began today. Patient stated at this point that all symptoms began right now right when she walked in. RN questioned patient about change from original story of symptoms beginning yesterday. Pt became increasingly agitated and continued to state that all symptoms began just now. Maryan Rued, MD, made aware. When this RN returned to room, pt was ambulating without difficulty in hallway, loudly and aggressively demanding a room and stating that she would leave to go to [facility]. Pt then stated she would call an ambulance. This RN informed patient that ambulance would not place her in a room because there are no rooms currently.   Right face is drooped, speech is slurred.

## 2015-12-24 NOTE — ED Notes (Signed)
This RN again attempted to determine what symptoms the patient is experiencing and when their onset was. Pt states "I already told it all to the doctor" and would not describe symptom progression to this RN.

## 2015-12-25 ENCOUNTER — Inpatient Hospital Stay (HOSPITAL_COMMUNITY): Payer: Medicaid Other

## 2015-12-25 ENCOUNTER — Encounter (HOSPITAL_COMMUNITY): Payer: Self-pay | Admitting: *Deleted

## 2015-12-25 DIAGNOSIS — I639 Cerebral infarction, unspecified: Secondary | ICD-10-CM

## 2015-12-25 DIAGNOSIS — I6789 Other cerebrovascular disease: Secondary | ICD-10-CM

## 2015-12-25 LAB — GLUCOSE, CAPILLARY
Glucose-Capillary: 103 mg/dL — ABNORMAL HIGH (ref 65–99)
Glucose-Capillary: 105 mg/dL — ABNORMAL HIGH (ref 65–99)
Glucose-Capillary: 123 mg/dL — ABNORMAL HIGH (ref 65–99)

## 2015-12-25 NOTE — Progress Notes (Signed)
SLP Cancellation Note  Patient Details Name: Theresa Watkins MRN: [id number] DOB: Apr 12, 1966   Cancelled treatment:       Reason Eval/Treat Not Completed: Fatigue/lethargy limiting ability to participate.  The patient stated that she has not gotten any sleep and was requesting that we check back later this evening or tomorrow.    [name], MA, [place] [facility] SLP [phone] [name] 12/25/2015, 10:04 AM

## 2015-12-25 NOTE — Progress Notes (Signed)
VASCULAR LAB PRELIMINARY  PRELIMINARY  PRELIMINARY  PRELIMINARY  Carotid duplex completed.    Preliminary report:  1-39% ICA plaquing (soft plaque CCA and origin ICA).  Vertebral artery flow is antegrade.   Keondria Siever, RVT 12/25/2015, 8:01 AM

## 2015-12-25 NOTE — Progress Notes (Addendum)
PT Cancellation Note  Patient Details Name: Theresa Watkins MRN: [id number] DOB: February 12, 1966   Cancelled Treatment:    Reason Eval/Treat Not Completed: Fatigue/lethargy limiting ability to participate. Pt reports she is too tired to participate with therapy at this time, and states she has not had any sleep all night. Will check back as schedule allows to complete PT eval.    Rolinda Roan 12/25/2015, 10:13 AM   Addendum: Checked back in early afternoon and transport present to take pt to CT. Will check back to complete PT eval as able.  Rolinda Roan, PT, DPT Acute Rehabilitation Services Pager: [phone]

## 2015-12-25 NOTE — Progress Notes (Signed)
Rehab Admissions Coordinator Note:  Patient was screened by Retta Diones for appropriateness for an Inpatient Acute Rehab Consult.  At this time, we are recommending Inpatient Rehab consult.  Retta Diones 12/25/2015, 3:57 PM  I can be reached at [phone].

## 2015-12-25 NOTE — Care Management Note (Signed)
Case Management Note  Patient Details  Name: Theresa Watkins MRN: [id number] Date of Birth: 03-17-1966  Subjective/Objective:                    Action/Plan: Patient was admitted with right arm weakness and slurred speech. Lives at home alone. Will follow for discharge needs pending PT/OT evals and physician orders. Expected Discharge Date:  12/27/15               Expected Discharge Plan:     In-House Referral:     Discharge planning Services     Post Acute Care Choice:    Choice offered to:     DME Arranged:    DME Agency:     HH Arranged:    HH Agency:     Status of Service:  In process, will continue to follow  Medicare Important Message Given:    Date Medicare IM Given:    Medicare IM give by:    Date Additional Medicare IM Given:    Additional Medicare Important Message give by:     If discussed at [place] of Stay Meetings, dates discussed:    Additional CommentsRolm Baptise, RN 12/25/2015, 4:21 PM [phone]

## 2015-12-25 NOTE — Progress Notes (Signed)
STROKE TEAM PROGRESS NOTE   HISTORY OF PRESENT ILLNESS Theresa Watkins is an 50 y.o. female with a history of hypertension, thyroid cancer and cocaine use, as well as reported history of stroke 7 years ago, presenting with new onset right facial and upper extremity weakness, as well as slurred speech. Patient also has had several falls over the past couple of days. She also admits to use of cocaine last night. Urine drug screen was positive for cocaine. There is an equivocal history of transient right lower extremity weakness. Information obtained from the patient is somewhat unreliable. CT scan of her head was unremarkable with no acute intracranial abnormality. NIH stroke score was 8. Her last known well was unclear. Patient was not administered TPA secondary to unknown last known well. She was admitted for further evaluation and treatment.   SUBJECTIVE (INTERVAL HISTORY)  no family is at the bedside.  Overall she feels her condition is stable.    OBJECTIVE Temp:  [97 F (36.1 C)-99 F (37.2 C)] 98.8 F (37.1 C) (02/06 1200) Pulse Rate:  [54-89] 65 (02/06 1200) Cardiac Rhythm:  [-] Normal sinus rhythm (02/06 0834) Resp:  [15-18] 18 (02/05 2300) BP: (109-166)/(72-109) 142/98 mmHg (02/06 1200) SpO2:  [96 %-100 %] 97 % (02/06 1200) Weight:  [74.798 kg (164 lb 14.4 oz)] 74.798 kg (164 lb 14.4 oz) (02/06 0830)  CBC:   Recent Labs Lab 12/24/15 2005 12/24/15 2013  WBC 6.9  --   NEUTROABS 4.3  --   HGB 15.8* 17.7*  HCT 46.1* 52.0*  MCV 85.5  --   PLT 181  --     Basic Metabolic Panel:   Recent Labs Lab 12/24/15 2005 12/24/15 2013  NA 144 142  K 4.0 3.8  CL 109 108  CO2 21*  --   GLUCOSE 89 84  BUN 16 18  CREATININE 0.91 0.90  CALCIUM 9.2  --     Lipid Panel:     Component Value Date/Time   CHOL  06/15/2010 0545    181        ATP III CLASSIFICATION:  <200     mg/dL   Desirable  200-239  mg/dL   Borderline High  >=240    mg/dL   High          TRIG 109  06/15/2010 0545   HDL 40 06/15/2010 0545   CHOLHDL 4.5 06/15/2010 0545   VLDL 22 06/15/2010 0545   LDLCALC * 06/15/2010 0545    119        Total Cholesterol/HDL:CHD Risk Coronary Heart Disease Risk Table                     Men   Women  1/2 Average Risk   3.4   3.3  Average Risk       5.0   4.4  2 X Average Risk   9.6   7.1  3 X Average Risk  23.4   11.0        Use the calculated Patient Ratio above and the CHD Risk Table to determine the patient's CHD Risk.        ATP III CLASSIFICATION (LDL):  <100     mg/dL   Optimal  100-129  mg/dL   Near or Above                    Optimal  130-159  mg/dL   Borderline  160-189  mg/dL   High  >  190     mg/dL   Very High   HgbA1c:  Lab Results  Component Value Date   HGBA1C  06/14/2010    5.6 (NOTE)                                                                       According to the ADA Clinical Practice Recommendations for 2011, when HbA1c is used as a screening test:   >=6.5%   Diagnostic of Diabetes Mellitus           (if abnormal result  is confirmed)  5.7-6.4%   Increased risk of developing Diabetes Mellitus  References:Diagnosis and Classification of Diabetes Mellitus,Diabetes [id number] 1):S62-S69 and Standards of Medical Care in         Diabetes - 2011,Diabetes [id number]  (Suppl 1):S11-S61.   Urine Drug Screen:     Component Value Date/Time   LABOPIA NONE DETECTED 12/24/2015 2312   COCAINSCRNUR POSITIVE* 12/24/2015 2312   LABBENZ NONE DETECTED 12/24/2015 2312   AMPHETMU NONE DETECTED 12/24/2015 2312   THCU NONE DETECTED 12/24/2015 2312   LABBARB NONE DETECTED 12/24/2015 2312      IMAGING  Ct Head Wo Contrast  12/24/2015  CLINICAL DATA:  Right-sided weakness, multiple falls x 48 hours EXAM: CT HEAD WITHOUT CONTRAST TECHNIQUE: Contiguous axial images were obtained from the base of the skull through the vertex without intravenous contrast. COMPARISON:  CT head dated 12/06/2014 FINDINGS: No evidence of parenchymal  hemorrhage or extra-axial fluid collection. No mass lesion, mass effect, or midline shift. No CT evidence of acute infarction. Cerebral volume is within normal limits.  No ventriculomegaly. The visualized paranasal sinuses are essentially clear. The mastoid air cells are unopacified. No evidence of calvarial fracture. IMPRESSION: Normal head CT. Electronically Signed   By: Julian Hy M.D.   On: 12/24/2015 20:00   Carotid Doppler   There is 1-39% bilateral ICA stenosis. Vertebral artery flow is antegrade.   2D Echocardiogram  - Left ventricle: The cavity size was normal. Wall thickness wasnormal. Systolic function was normal. The estimated ejectionfraction was in the range of 55% to 60%. Wall motion was normal;there were no regional wall motion abnormalities. Leftventricular diastolic function parameters were normal. - Left atrium: The atrium was normal in size. - Atrial septum: Type 1R atrial septal aneurysm - small PFO issuspected. Impressions:  Essentially normal echo, except for a Type 1R atrial septalaneurysm - there is likely a small PFO with left to right flow bycolor doppler. Recommend TEE with bubble study to assess further.   PHYSICAL EXAM Frail middle-aged African-American lady not in distress. . Afebrile. Head is nontraumatic. Neck is supple without bruit.    Cardiac exam no murmur or gallop. Lungs are clear to auscultation. Distal pulses are well felt. Neurological Exam :  Awake alert oriented 3 with normal speech and language function. No aphasia or apraxia dysarthria. Extraocular movements are full range without nystagmus. Right lower facial weakness. Tongue midline. Right upper extremity drift. Weakness of right grip and intrinsic hand muscles and 4/5 strength right approximately. Symmetric lower extremity strength. Sensation is diminished in the right upper extremity and face. Reflexes are symmetric. Gait was not tested. ASSESSMENT/PLAN Theresa Watkins is a 50  y.o. female with history  of hypertension, thyroid cancer, cocaine use, previous stroke presenting with right facial weakness, right upper extremity weakness and slurred speech. She did not receive IV t-PA due to unclear last known well.   Left brain stroke symptoms, possible  Subcortical Stroke vs TIA, workup underway  Resultant  Neurologic symptoms resolved  MRI  / MRA  patient unable to tolerate  Preferred CT angiogram of head, however patient was allergic to the dye. We'll repeat the plain CT  Carotid Doppler  No significant stenosis  2D Echo  no source of clot, likely small PFO  LDL standing  HgbA1c standing  Lovenox 40 mg sq daily for VTE prophylaxis Diet Heart Room service appropriate?: Yes; Fluid consistency:: Thin  aspirin 81 mg daily prior to admission, now on aspirin 325 mg daily  Patient counseled to be compliant with her antithrombotic medications  Ongoing aggressive stroke risk factor management  Therapy recommendations:  CIR. Consult placed  Disposition:  pending   Hypertension  Stable  Permissive hypertension (OK if < 220/120) but gradually normalize in 5-7 days  Other Stroke Risk Factors  Cigarette smoker, advised to stop smoking  ETOH use  Cocaine use, urine drug screen positive this admission  Hx stroke/TIA 7 years ago  Family hx stroke (father)  Other Active Problems  Hx HA  Hx seizures  Hx thyroid [facility] day # [place] for Pager information 12/25/2015 4:09 PM  I have personally examined this patient, reviewed notes, independently viewed imaging studies, participated in medical decision making and plan of care. I have made any additions or clarifications directly to the above note. Agree with note above. She presented with transient right face and upper extremity weakness and slurred speech due to suspect small left brain subcortical infarct not visualized on the CT scan. Patient has not  been able to tolerate an MRI. She remains at risk for neurological worsening, recurrent stroke, TIA needs ongoing stroke evaluation. Patient was counseled to quit using cocaine and alcohol and smoking cigarettes. Recommend repeat CT scan  Antony Contras, MD Medical Director [place] Pager: [phone] 12/25/2015 4:51 PM    To contact Stroke Continuity provider, please refer to [email or web address] After hours, contact General Neurology

## 2015-12-25 NOTE — Progress Notes (Signed)
  Echocardiogram 2D Echocardiogram has been performed.  Jennette Dubin 12/25/2015, 8:20 AM

## 2015-12-25 NOTE — Consult Note (Signed)
Admission H&P    Chief Complaint: New onset right-sided weakness.  HPI: Theresa Watkins is an 50 y.o. female with a history of hypertension, thyroid cancer and cocaine use, as well as reported history of stroke 7 years ago, presenting with new onset right facial and upper extremity weakness, as well as slurred speech. Patient also has had several falls over the past couple of days. She also admits to use of cocaine last night. Urine drug screen was positive for cocaine. There is an equivocal history of transient right lower extremity weakness. Information obtained from the patient is somewhat unreliable. CT scan of her head was unremarkable with no acute intracranial abnormality. NIH stroke score was 8.  LSN: Unclear. tPA Given: No: Unclear when last known well mRankin:  Past Medical History  Diagnosis Date  . Headache(784.0)   . Seizures ([place])   . Thyroid cancer ([place]) [address]11  . Bronchitis   . Urinary tract infection   . Ovarian cyst   . Fibroid   . Abnormal Pap smear   . Trichomonas     Past Surgical History  Procedure Laterality Date  . Abdominal hysterectomy    . Laparoscopic  1988    removal  of ectopic preg, ruptured tube  . Therapeutic abortion    . Cesarean section    . Thyroidectomy  march 2011    cancer  . Goiter      removed    Family History  Problem Relation Age of Onset  . Bell's palsy Mother   . Cancer Father   . Stroke Father    Social History:  reports that she has been smoking Cigarettes.  She has a 12.5 pack-year smoking history. She has never used smokeless tobacco. She reports that she drinks alcohol. She reports that she uses illicit drugs (Cocaine).  Allergies:  Allergies  Allergen Reactions  . Iohexol      Code: HIVES, Desc: pts tongue began itching post injection and throat burning, Onset Date: 24268341   . Percocet [Oxycodone-Acetaminophen] Rash    Medications Prior to Admission  Medication Sig Dispense Refill  . aspirin 81 MG tablet  Take 81 mg by mouth daily.    . Calcium Carbonate-Vitamin D (CALCIUM + D PO) Take 2 tablets by mouth 2 (two) times daily.     Marland Kitchen levothyroxine (SYNTHROID, LEVOTHROID) 125 MCG tablet Take 1 tablet (125 mcg total) by mouth daily before breakfast. 30 tablet 1    ROS: Unreliable information obtained from patient.  Physical Examination: Blood pressure 128/72, pulse 80, temperature 98.1 F (36.7 C), temperature source Oral, resp. rate 18, SpO2 97 %.  HEENT-  Normocephalic, no lesions, without obvious abnormality.  Normal external eye and conjunctiva.  Normal TM's bilaterally.  Normal auditory canals and external ears. Normal external nose, mucus membranes and septum.  Normal pharynx. Neck supple with no masses, nodes, nodules or enlargement. Cardiovascular - regular rate and rhythm, S1, S2 normal, no murmur, click, rub or gallop Lungs - chest clear, no wheezing, rales, normal symmetric air entry Abdomen - soft, non-tender; bowel sounds normal; no masses,  no organomegaly Extremities - no joint deformities, effusion, or inflammation and no edema  Neurologic Examination: Mental Status: Alert, disoriented to correct month, affect inconsistent with seriousness of deficits.  Speech disarthritic without evidence of aphasia. Able to follow commands without difficulty. Cranial Nerves: II-Visual fields were normal. III/IV/VI-Pupils were equal and reacted normally to light. Extraocular movements were full and conjugate.    V/VII-reduced perception of tactile sensation  over the right side of the face compared to left; moderate right lower facial weakness. VIII-normal. X-moderate dysarthria. XI: trapezius strength/neck flexion strength normal bilaterally XII-midline tongue extension with normal strength. Motor: Severe weakness of right upper extremity proximally and distally with flaccid tone; motor exam otherwise unremarkable Sensory: Reduced perception of tactile sensation over right extremities  compared to left extremities. Deep Tendon Reflexes: 2+ and symmetric. Plantars: Flexor bilaterally Cerebellar: Normal finger-to-nose testing with use of left upper extremity. Carotid auscultation: Normal  Results for orders placed or performed during the hospital encounter of 12/24/15 (from the past 48 hour(s))  Ethanol     Status: None   Collection Time: 12/24/15  8:05 PM  Result Value Ref Range   Alcohol, Ethyl (B) <5 <5 mg/dL    Comment:        LOWEST DETECTABLE LIMIT FOR SERUM ALCOHOL IS 5 mg/dL FOR MEDICAL PURPOSES ONLY   Protime-INR     Status: None   Collection Time: 12/24/15  8:05 PM  Result Value Ref Range   Prothrombin Time 13.0 11.6 - 15.2 seconds   INR 0.96 0.00 - 1.49  APTT     Status: None   Collection Time: 12/24/15  8:05 PM  Result Value Ref Range   aPTT 29 24 - 37 seconds  CBC     Status: Abnormal   Collection Time: 12/24/15  8:05 PM  Result Value Ref Range   WBC 6.9 4.0 - 10.5 K/uL   RBC 5.39 (H) 3.87 - 5.11 MIL/uL   Hemoglobin 15.8 (H) 12.0 - 15.0 g/dL   HCT 46.1 (H) 36.0 - 46.0 %   MCV 85.5 78.0 - 100.0 fL   MCH 29.3 26.0 - 34.0 pg   MCHC 34.3 30.0 - 36.0 g/dL   RDW 13.4 11.5 - 15.5 %   Platelets 181 150 - 400 K/uL  Differential     Status: None   Collection Time: 12/24/15  8:05 PM  Result Value Ref Range   Neutrophils Relative % 63 %   Neutro Abs 4.3 1.7 - 7.7 K/uL   Lymphocytes Relative 29 %   Lymphs Abs 2.0 0.7 - 4.0 K/uL   Monocytes Relative 7 %   Monocytes Absolute 0.5 0.1 - 1.0 K/uL   Eosinophils Relative 1 %   Eosinophils Absolute 0.1 0.0 - 0.7 K/uL   Basophils Relative 0 %   Basophils Absolute 0.0 0.0 - 0.1 K/uL  Comprehensive metabolic panel     Status: Abnormal   Collection Time: 12/24/15  8:05 PM  Result Value Ref Range   Sodium 144 135 - 145 mmol/L   Potassium 4.0 3.5 - 5.1 mmol/L   Chloride 109 101 - 111 mmol/L   CO2 21 (L) 22 - 32 mmol/L   Glucose, Bld 89 65 - 99 mg/dL   BUN 16 6 - 20 mg/dL   Creatinine, Ser 0.91 0.44 - 1.00  mg/dL   Calcium 9.2 8.9 - 10.3 mg/dL   Total Protein 7.3 6.5 - 8.1 g/dL   Albumin 4.2 3.5 - 5.0 g/dL   AST 18 15 - 41 U/L   ALT 12 (L) 14 - 54 U/L   Alkaline Phosphatase 65 38 - 126 U/L   Total Bilirubin 0.6 0.3 - 1.2 mg/dL   GFR calc non Af Amer >60 >60 mL/min   GFR calc Af Amer >60 >60 mL/min    Comment: (NOTE) The eGFR has been calculated using the CKD EPI equation. This calculation has not been validated in all  clinical situations. eGFR's persistently <60 mL/min signify possible Chronic Kidney Disease.    Anion gap 14 5 - 15  I-stat troponin, ED (not at [facility], [facility])     Status: None   Collection Time: 12/24/15  8:11 PM  Result Value Ref Range   Troponin i, poc 0.00 0.00 - 0.08 ng/mL   Comment 3            Comment: Due to the release kinetics of cTnI, a negative result within the first hours of the onset of symptoms does not rule out myocardial infarction with certainty. If myocardial infarction is still suspected, repeat the test at appropriate intervals.   I-Stat Chem 8, ED  (not at [facility], [facility])     Status: Abnormal   Collection Time: 12/24/15  8:13 PM  Result Value Ref Range   Sodium 142 135 - 145 mmol/L   Potassium 3.8 3.5 - 5.1 mmol/L   Chloride 108 101 - 111 mmol/L   BUN 18 6 - 20 mg/dL   Creatinine, Ser 0.90 0.44 - 1.00 mg/dL   Glucose, Bld 84 65 - 99 mg/dL   Calcium, Ion 1.08 (L) 1.12 - 1.23 mmol/L   TCO2 24 0 - 100 mmol/L   Hemoglobin 17.7 (H) 12.0 - 15.0 g/dL   HCT 52.0 (H) 36.0 - 46.0 %  Urine rapid drug screen (hosp performed)not at [facility]     Status: Abnormal   Collection Time: 12/24/15 11:12 PM  Result Value Ref Range   Opiates NONE DETECTED NONE DETECTED   Cocaine POSITIVE (A) NONE DETECTED   Benzodiazepines NONE DETECTED NONE DETECTED   Amphetamines NONE DETECTED NONE DETECTED   Tetrahydrocannabinol NONE DETECTED NONE DETECTED   Barbiturates NONE DETECTED NONE DETECTED    Comment:        DRUG SCREEN FOR MEDICAL PURPOSES ONLY.  IF CONFIRMATION IS  NEEDED FOR ANY PURPOSE, NOTIFY LAB WITHIN 5 DAYS.        LOWEST DETECTABLE LIMITS FOR URINE DRUG SCREEN Drug Class       Cutoff (ng/mL) Amphetamine      1000 Barbiturate      200 Benzodiazepine   665 Tricyclics       993 Opiates          300 Cocaine          300 THC              50   Urinalysis, Routine w reflex microscopic (not at [facility])     Status: Abnormal   Collection Time: 12/24/15 11:12 PM  Result Value Ref Range   Color, Urine YELLOW YELLOW   APPearance CLOUDY (A) CLEAR   Specific Gravity, Urine 1.024 1.005 - 1.030   pH 5.5 5.0 - 8.0   Glucose, UA NEGATIVE NEGATIVE mg/dL   Hgb urine dipstick TRACE (A) NEGATIVE   Bilirubin Urine NEGATIVE NEGATIVE   Ketones, ur 15 (A) NEGATIVE mg/dL   Protein, ur NEGATIVE NEGATIVE mg/dL   Nitrite NEGATIVE NEGATIVE   Leukocytes, UA MODERATE (A) NEGATIVE  Urine microscopic-add on     Status: Abnormal   Collection Time: 12/24/15 11:12 PM  Result Value Ref Range   Squamous Epithelial / LPF 0-5 (A) NONE SEEN   WBC, UA 6-30 0 - 5 WBC/hpf   RBC / HPF 0-5 0 - 5 RBC/hpf   Bacteria, UA FEW (A) NONE SEEN  Glucose, capillary     Status: Abnormal   Collection Time: 12/25/15 12:20 AM  Result Value Ref Range  Glucose-Capillary 123 (H) 65 - 99 mg/dL   Comment 1 Notify RN    Comment 2 Document in Chart    Ct Head Wo Contrast  12/24/2015  CLINICAL DATA:  Right-sided weakness, multiple falls x 48 hours EXAM: CT HEAD WITHOUT CONTRAST TECHNIQUE: Contiguous axial images were obtained from the base of the skull through the vertex without intravenous contrast. COMPARISON:  CT head dated 12/06/2014 FINDINGS: No evidence of parenchymal hemorrhage or extra-axial fluid collection. No mass lesion, mass effect, or midline shift. No CT evidence of acute infarction. Cerebral volume is within normal limits.  No ventriculomegaly. The visualized paranasal sinuses are essentially clear. The mastoid air cells are unopacified. No evidence of calvarial fracture. IMPRESSION:  Normal head CT. Electronically Signed   By: Julian Hy M.D.   On: 12/24/2015 20:00    Assessment: 50 y.o. female with hypertension and reported history of previous seizures as well as history of cocaine abuse, presenting with acute left MCA territory ischemic infarction.  Stroke Risk Factors - hypertension and cocaine abuse  Plan: 1. HgbA1c, fasting lipid panel 2. MRI, MRA  of the brain without contrast 3. PT consult, OT consult, Speech consult 4. Echocardiogram 5. Carotid dopplers 6. Prophylactic therapy-Antiplatelet med: Aspirin  7. Risk factor modification 8. Telemetry monitoring  C.R. Nicole Kindred, MD [facility] [phone]  12/25/2015, 1:29 AM

## 2015-12-25 NOTE — Progress Notes (Signed)
PROGRESS NOTE  JOURNIEE DRIER [id number] DOB: 1966/08/18 DOA: 12/24/2015 PCP: No PCP Per Patient  Assessment/Plan: Acute Stroke:   new.  Stroke clinically with right arm weakness and slurred speech. Passed swallow screen. - telemetry -Daily aspirin enteric coated 325 mg (patient does not tolerate normal aspirin) -Lipids, hemoglobin A1c pending -Carotid doppler: 1-39% ICA plaquing (soft plaque CCA and origin ICA).  -MRI/MRA ordered but patient unable to tolerate-- defer to neurology if we just repeat the CT scan (CTA ordered) -Echocardiogram  -PT/OT/SLP consultation- patient currently too tired to participate -Consult to Neurology, appreciate recommendations  Hypothyroidism:  S/p thyroidectomy for thyroid cancer. Patient without recent PCP follow up. Continue home levothyroxine.  Check TSH  Smoking:  Counseled complete cessation. -Nicotine replacement, low dose  Erythrocytosis: Suspect hemoconcentration. Repeat CBC after fluids  Cocaine abuse -encouraged cessation   Code Status: full Family Communication: patient  Disposition Plan: await PT eval   Consultants:  neuro  Procedures:      HPI/Subjective: Does not want to be bothered-- "I'm tired" come back later  Objective: Filed Vitals:   12/25/15 1000 12/25/15 1200  BP: 166/96 142/98  Pulse: 58 65  Temp: 99 F (37.2 C) 98.8 F (37.1 C)  Resp:      Intake/Output Summary (Last 24 hours) at 12/25/15 1218 Last data filed at 12/24/15 2159  Gross per 24 hour  Intake    240 ml  Output      0 ml  Net    240 ml   Filed Weights   12/25/15 0830  Weight: 74.798 kg (164 lb 14.4 oz)    Exam:   General:  Slurred speech  Cardiovascular: rrr  Respiratory: clear  Abdomen: +BS  Musculoskeletal: right arm weakness    Data Reviewed: Basic Metabolic Panel:  Recent Labs Lab 12/24/15 2005 12/24/15 2013  NA 144 142  K 4.0 3.8  CL 109 108  CO2 21*  --   GLUCOSE 89 84  BUN 16 18    CREATININE 0.91 0.90  CALCIUM 9.2  --    Liver Function Tests:  Recent Labs Lab 12/24/15 2005  AST 18  ALT 12*  ALKPHOS 65  BILITOT 0.6  PROT 7.3  ALBUMIN 4.2   No results for input(s): LIPASE, AMYLASE in the last 168 hours. No results for input(s): AMMONIA in the last 168 hours. CBC:  Recent Labs Lab 12/24/15 2005 12/24/15 2013  WBC 6.9  --   NEUTROABS 4.3  --   HGB 15.8* 17.7*  HCT 46.1* 52.0*  MCV 85.5  --   PLT 181  --    Cardiac Enzymes: No results for input(s): CKTOTAL, CKMB, CKMBINDEX, TROPONINI in the last 168 hours. BNP (last 3 results) No results for input(s): BNP in the last 8760 hours.  ProBNP (last 3 results) No results for input(s): PROBNP in the last 8760 hours.  CBG:  Recent Labs Lab 12/25/15 0020 12/25/15 0650 12/25/15 1152  GLUCAP 123* 103* 105*    No results found for this or any previous visit (from the past 240 hour(s)).   Studies: Ct Head Wo Contrast  12/24/2015  CLINICAL DATA:  Right-sided weakness, multiple falls x 48 hours EXAM: CT HEAD WITHOUT CONTRAST TECHNIQUE: Contiguous axial images were obtained from the base of the skull through the vertex without intravenous contrast. COMPARISON:  CT head dated 12/06/2014 FINDINGS: No evidence of parenchymal hemorrhage or extra-axial fluid collection. No mass lesion, mass effect, or midline shift. No CT evidence of acute infarction. Cerebral volume  is within normal limits.  No ventriculomegaly. The visualized paranasal sinuses are essentially clear. The mastoid air cells are unopacified. No evidence of calvarial fracture. IMPRESSION: Normal head CT. Electronically Signed   By: Julian Hy M.D.   On: 12/24/2015 20:00    Scheduled Meds: . aspirin EC  325 mg Oral Daily  . enoxaparin (LOVENOX) injection  40 mg Subcutaneous QHS  . levothyroxine  125 mcg Oral QAC breakfast  . nicotine  7 mg Transdermal Daily   Continuous Infusions:  Antibiotics Given (last 72 hours)    None       Principal Problem:   Acute ischemic stroke [facility]) Active Problems:   Drug abuse, cocaine type   Hypothyroidism (acquired)   Stroke ([place])    Time spent: 35 min    [facility] Pager [phone]. If 7PM-7AM, please contact night-coverage at [email or web address], password [facility] 12/25/2015, 12:18 PM  LOS: 1 day

## 2015-12-25 NOTE — Evaluation (Addendum)
Occupational Therapy Evaluation Patient Details Name: Theresa Watkins MRN: [id number] DOB: 1966-04-16 Today's Date: 12/25/2015    History of Present Illness 50 y.o. female with a history of hypertension, thyroid cancer and cocaine use, as well as reported history of stroke 7 years ago, who presented with new onset right facial and upper extremity weakness, as well as slurred speech. PMH also includes UTI, seizures, trichomonas, and headache. Further stroke work up pending.   Clinical Impression   Pt admitted with above. Pt independent with ADLs, PTA. Feel pt will benefit from acute OT to increase independence and address RUE prior to d/c.     Follow Up Recommendations  CIR;Supervision/Assistance - 24 hour    Equipment Recommendations  Other (comment) (TBD)    Recommendations for Other Services Rehab consult     Precautions / Restrictions Precautions Precautions: Fall Restrictions Weight Bearing Restrictions: No      Mobility Bed Mobility Overal bed mobility: Needs Assistance Bed Mobility: Sit to Sidelying;Supine to Sit     Supine to sit: Mod assist   Sit to sidelying: Modified independent (Device/Increase time) General bed mobility comments: assist with trunk to come to sitting  Transfers Overall transfer level: Needs assistance   Transfers: Sit to/from Stand Sit to Stand: Min assist         General transfer comment: Min guard-Min assist given    Balance      Assist given for ambulation-pt unsteady.                                      ADL Overall ADL's : Needs assistance/impaired                 Upper Body Dressing : Moderate assistance;Sitting   Lower Body Dressing: Sit to/from stand   Toilet Transfer: Moderate assistance;Ambulation (hand held assist; sit to stand from bed)           Functional mobility during ADLs: Moderate assistance (hand held assist) General ADL Comments: encouraged pt to try to use Rt hand  functionally.     Vision     Perception     Praxis      Pertinent Vitals/Pain Pain Assessment: No/denies pain     Hand Dominance Right   Extremity/Trunk Assessment Upper Extremity Assessment Upper Extremity Assessment: RUE deficits/detail RUE Deficits / Details: little shoulder movement-weakness; minimal finger movement; weak grip strength RUE Sensation: decreased light touch RUE Coordination: decreased fine motor;decreased gross motor   Lower Extremity Assessment Lower Extremity Assessment: Defer to PT evaluation       Communication Communication Communication: Expressive difficulties   Cognition Arousal/Alertness: Awake/alert Behavior During Therapy: WFL for tasks assessed/performed Overall Cognitive Status:  (unsure of baseline) Area of Impairment: Orientation Orientation Level: Disoriented to;Place;Time                 General Comments       Exercises       Shoulder Instructions      Home Living Family/patient expects to be discharged to:: Unsure Living Arrangements: Alone   Type of Home: Other(Comment) (townhome)       Home Layout: Two level Alternate Level Stairs-Number of Steps: 20 Alternate Level Stairs-Rails: Left Bathroom Shower/Tub: Teacher, early years/pre: Standard     Home Equipment:  (mother has shower chair pt may can use)          Prior Functioning/Environment Level of  Independence: Independent             OT Diagnosis: Other (comment) (hemiparesis dominant side)   OT Problem List: Impaired UE functional use;Impaired sensation;Decreased knowledge of precautions;Decreased cognition;Decreased knowledge of use of DME or AE;Impaired balance (sitting and/or standing);Decreased coordination;Decreased strength   OT Treatment/Interventions: DME and/or AE instruction;Therapeutic activities;Cognitive remediation/compensation;Visual/perceptual remediation/compensation;Patient/family education;Balance  training;Therapeutic exercise;Self-care/ADL training;Neuromuscular education    OT Goals(Current goals can be found in the care plan section) Acute Rehab OT Goals Patient Stated Goal: not stated OT Goal Formulation: With patient Time For Goal Achievement: 01/01/16 Potential to Achieve Goals: Good  OT Frequency: Min 2X/week   Barriers to D/C:            Co-evaluation              End of Session Equipment Utilized During Treatment: Gait belt  Activity Tolerance: Patient tolerated treatment well (also reported dizziness) Patient left: in bed;with call bell/phone within reach;with bed alarm set;with family/visitor present   Time: [phone] (approximately 6 minutes with pt ordering lunch and dinner) OT Time Calculation (min): 19 min Charges:  OT General Charges $OT Visit: 1 Procedure OT Evaluation $OT Eval Moderate Complexity: 1 Procedure G-CodesBenito Mccreedy OTR/L [id number] 12/25/2015, 10:05 AM

## 2015-12-26 ENCOUNTER — Inpatient Hospital Stay (HOSPITAL_COMMUNITY): Payer: Medicaid Other

## 2015-12-26 DIAGNOSIS — F1410 Cocaine abuse, uncomplicated: Secondary | ICD-10-CM | POA: Insufficient documentation

## 2015-12-26 DIAGNOSIS — IMO0002 Reserved for concepts with insufficient information to code with codable children: Secondary | ICD-10-CM | POA: Insufficient documentation

## 2015-12-26 DIAGNOSIS — I69359 Hemiplegia and hemiparesis following cerebral infarction affecting unspecified side: Secondary | ICD-10-CM | POA: Insufficient documentation

## 2015-12-26 DIAGNOSIS — R471 Dysarthria and anarthria: Secondary | ICD-10-CM

## 2015-12-26 DIAGNOSIS — F1010 Alcohol abuse, uncomplicated: Secondary | ICD-10-CM | POA: Insufficient documentation

## 2015-12-26 DIAGNOSIS — Z720 Tobacco use: Secondary | ICD-10-CM

## 2015-12-26 LAB — LIPID PANEL
CHOL/HDL RATIO: 3.7 ratio
Cholesterol: 228 mg/dL — ABNORMAL HIGH (ref 0–200)
HDL: 62 mg/dL (ref >40)
LDL CALC: 135 mg/dL — AB (ref 0–99)
Triglycerides: 157 mg/dL — ABNORMAL HIGH (ref <150)
VLDL: 31 mg/dL (ref 0–40)

## 2015-12-26 LAB — TROPONIN I: Troponin I: <0.03 ng/mL (ref <0.031)

## 2015-12-26 LAB — TSH: TSH: 18.683 u[IU]/mL — ABNORMAL HIGH (ref 0.350–4.500)

## 2015-12-26 MED ORDER — ATORVASTATIN CALCIUM 40 MG PO TABS
40.0000 mg | ORAL_TABLET | Freq: Every day | ORAL | Status: DC
  Administered 2015-12-26: 40 mg via ORAL
  Filled 2015-12-26: qty 1

## 2015-12-26 MED ORDER — ALUM & MAG HYDROXIDE-SIMETH 200-200-20 MG/5ML PO SUSP
30.0000 mL | Freq: Four times a day (QID) | ORAL | Status: DC | PRN
  Administered 2015-12-26: 30 mL via ORAL
  Filled 2015-12-26: qty 30

## 2015-12-26 MED ORDER — MORPHINE SULFATE (PF) 2 MG/ML IV SOLN: 2.0000 mg | INTRAVENOUS | Status: DC | PRN

## 2015-12-26 NOTE — Progress Notes (Addendum)
STROKE TEAM PROGRESS NOTE   HISTORY OF PRESENT ILLNESS Theresa Watkins is an 50 y.o. female with a history of hypertension, thyroid cancer and cocaine use, as well as reported history of stroke 7 years ago, presenting with new onset right facial and upper extremity weakness, as well as slurred speech. Patient also has had several falls over the past couple of days. She also admits to use of cocaine last night. Urine drug screen was positive for cocaine. There is an equivocal history of transient right lower extremity weakness. Information obtained from the patient is somewhat unreliable. CT scan of her head was unremarkable with no acute intracranial abnormality. NIH stroke score was 8. Her last known well was unclear. Patient was not administered TPA secondary to unknown last known well. She was admitted for further evaluation and treatment.   SUBJECTIVE (INTERVAL HISTORY)  No family is at the bedside.  Overall she feels her condition is stable. Repeat CT scan of the head shows tiny hyperdense focus in the left periventricular region likely a tiny hemorrhagic lacunar infarct   OBJECTIVE Temp:  [98.1 F (36.7 C)-99.1 F (37.3 C)] 98.8 F (37.1 C) (02/07 1358) Pulse Rate:  [62-80] 80 (02/07 1358) Cardiac Rhythm:  [-] Normal sinus rhythm (02/07 0700) Resp:  [18] 18 (02/07 1358) BP: (110-134)/(68-91) 133/91 mmHg (02/07 1358) SpO2:  [95 %-100 %] 96 % (02/07 1358)  CBC:   Recent Labs Lab 12/24/15 2005 12/24/15 2013  WBC 6.9  --   NEUTROABS 4.3  --   HGB 15.8* 17.7*  HCT 46.1* 52.0*  MCV 85.5  --   PLT 181  --     Basic Metabolic Panel:   Recent Labs Lab 12/24/15 2005 12/24/15 2013  NA 144 142  K 4.0 3.8  CL 109 108  CO2 21*  --   GLUCOSE 89 84  BUN 16 18  CREATININE 0.91 0.90  CALCIUM 9.2  --     Lipid Panel:     Component Value Date/Time   CHOL 228* 12/26/2015 0408   TRIG 157* 12/26/2015 0408   HDL 62 12/26/2015 0408   CHOLHDL 3.7 12/26/2015 0408   VLDL 31  12/26/2015 0408   LDLCALC 135* 12/26/2015 0408   HgbA1c:  Lab Results  Component Value Date   HGBA1C  06/14/2010    5.6 (NOTE)                                                                       According to the ADA Clinical Practice Recommendations for 2011, when HbA1c is used as a screening test:   >=6.5%   Diagnostic of Diabetes Mellitus           (if abnormal result  is confirmed)  5.7-6.4%   Increased risk of developing Diabetes Mellitus  References:Diagnosis and Classification of Diabetes Mellitus,Diabetes [id number] 1):S62-S69 and Standards of Medical Care in         Diabetes - 2011,Diabetes Care,2011,34  (Suppl 1):S11-S61.   Urine Drug Screen:     Component Value Date/Time   LABOPIA NONE DETECTED 12/24/2015 2312   COCAINSCRNUR POSITIVE* 12/24/2015 2312   LABBENZ NONE DETECTED 12/24/2015 2312   AMPHETMU NONE DETECTED 12/24/2015 2312   THCU NONE DETECTED 12/24/2015 2312  LABBARB NONE DETECTED 12/24/2015 2312      IMAGING  Dg Chest 1 View  12/26/2015  CLINICAL DATA:  50 year old female with right upper chest pain since 1100 hours today. Initial encounter. EXAM: CHEST 1 VIEW COMPARISON:  10/30/2010 and earlier. FINDINGS: Portable AP semi upright view at 1119 hours. Lung volumes are stable and within normal limits. Normal cardiac size and mediastinal contours. Visualized tracheal air column is within normal limits. No pneumothorax, pulmonary edema, pleural effusion or confluent pulmonary opacity. IMPRESSION: No acute cardiopulmonary abnormality. Electronically Signed   By: Genevie Ann M.D.   On: 12/26/2015 11:52   Ct Head Wo Contrast  12/25/2015  CLINICAL DATA:  Stroke.  Headache.  Seizures.  Right arm weakness. EXAM: CT HEAD WITHOUT CONTRAST TECHNIQUE: Contiguous axial images were obtained from the base of the skull through the vertex without intravenous contrast. COMPARISON:  12/24/2015. Multiple previous examinations including MRI 10/30/2010. FINDINGS: The brainstem and  cerebellum again appear normal. Cerebral hemispheres appear normal MK by CT with the exception of new demonstration of a 3-4 mm hyperdense focus in the left caudate head/anterior body which could represent a tiny infarction with microhemorrhage. No evidence of cortical or large vessel territory infarction. No mass lesion, hemorrhage, hydrocephalus or extra-axial collection. No skull or skullbase lesion. Sinuses are clear. IMPRESSION: Question 3-4 mm hyperdense focus in the left caudate head/ anterior body that could represent a small-vessel caudate infarction with microhemorrhage. This is not absolutely definite. No other focal finding by CT. Electronically Signed   By: Nelson Chimes M.D.   On: 12/25/2015 14:05   Ct Head Wo Contrast  12/24/2015  CLINICAL DATA:  Right-sided weakness, multiple falls x 48 hours EXAM: CT HEAD WITHOUT CONTRAST TECHNIQUE: Contiguous axial images were obtained from the base of the skull through the vertex without intravenous contrast. COMPARISON:  CT head dated 12/06/2014 FINDINGS: No evidence of parenchymal hemorrhage or extra-axial fluid collection. No mass lesion, mass effect, or midline shift. No CT evidence of acute infarction. Cerebral volume is within normal limits.  No ventriculomegaly. The visualized paranasal sinuses are essentially clear. The mastoid air cells are unopacified. No evidence of calvarial fracture. IMPRESSION: Normal head CT. Electronically Signed   By: Julian Hy M.D.   On: 12/24/2015 20:00   Carotid Doppler   There is 1-39% bilateral ICA stenosis. Vertebral artery flow is antegrade.   2D Echocardiogram  - Left ventricle: The cavity size was normal. Wall thickness wasnormal. Systolic function was normal. The estimated ejectionfraction was in the range of 55% to 60%. Wall motion was normal;there were no regional wall motion abnormalities. Leftventricular diastolic function parameters were normal. - Left atrium: The atrium was normal in size. 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 Impressions:  Essentially normal echo, except for a Type 1R atrial septalaneurysm - there is likely a small PFO with left to right flow bycolor doppler. Recommend TEE with bubble study to assess further.   PHYSICAL EXAM Frail middle-aged African-American lady not in distress. . Afebrile. Head is nontraumatic. Neck is supple without bruit.    Cardiac exam no murmur or gallop. Lungs are clear to auscultation. Distal pulses are well felt. Neurological Exam :  Awake alert oriented 3 with normal speech and language function. No aphasia or apraxia dysarthria. Extraocular movements are full range without nystagmus. Right lower facial weakness. Tongue midline. Right upper extremity drift. Weakness of right grip and intrinsic hand muscles and 4/5 strength right approximately. Symmetric lower extremity  strength. Sensation is diminished in the right upper extremity and face. Reflexes are symmetric. Gait was not tested. ASSESSMENT/PLAN Theresa Watkins is a 50 y.o. female with history of hypertension, thyroid cancer, cocaine use, previous stroke presenting with right facial weakness, right upper extremity weakness and slurred speech. She did not receive IV t-PA due to unclear last known well.   Left brain stroke symptoms, small left subcortical lacunar infarct from small vessel diseasey  Resultant  Neurologic symptoms resolved  MRI  / MRA  patient unable to tolerate  Preferred CT angiogram of head, however patient was allergic to the dye. We'll repeat the plain CT  Carotid Doppler  No significant stenosis  2D Echo  no source of clot, likely small PFO  LDL 135  HgbA1c pending  Lovenox 40 mg sq daily for VTE prophylaxis Diet Heart Room service appropriate?: Yes; Fluid consistency:: Thin  aspirin 81 mg daily prior to admission, now on aspirin 325 mg daily  Patient counseled to be compliant with her antithrombotic  medications  Ongoing aggressive stroke risk factor management  Therapy recommendations:  CIR.    Disposition:  pending   Hypertension  Stable  Permissive hypertension (OK if < 220/120) but gradually normalize in 5-7 days  Other Stroke Risk Factors  Cigarette smoker, advised to stop smoking  ETOH use  Cocaine use, urine drug screen positive this admission  Hx stroke/TIA 7 years ago  Family hx stroke (father)  Hyperlipidimia- Lipitor started  Other Active Problems  Hx HA  Hx seizures  Hx thyroid xancer  Hospital day # Edina Norvelt for Pager information 12/26/2015 3:18 PM  I have personally examined this patient, reviewed notes, independently viewed imaging studies, participated in medical decision making and plan of care. I have made any additions or clarifications directly to the above note. . She presented with transient right face and upper extremity weakness and slurred speech due to suspect small left brain subcortical infarct now visualized on the repeat CT scan. Patient has not been able to tolerate an MRI.  Marland Kitchen Patient was counseled to quit using cocaine and alcohol and smoking cigarettes. Recommend ongoing therapy and rehabilitation evaluation. Continue aspirin. Start Lipitor 40 mg daily. Stroke service will sign off. Kindly call for questions.  Antony Contras, MD Medical Director [facility] Pager: [phone] 12/26/2015 3:18 PM    To contact Stroke Continuity provider, please refer to [email or web address] After hours, contact General Neurology

## 2015-12-26 NOTE — Progress Notes (Signed)
Patient called nurse to room complaining of right upper chest pain radiating to mid and left chest. MD available on unit and went in room with nurse to assess patient. Orders placed and carried out.

## 2015-12-26 NOTE — Consult Note (Signed)
Physical Medicine and Rehabilitation Consult Reason for Consult: Subcortical CVA Referring Physician: [facility]   HPI: Theresa Watkins is a 50 y.o. right handed female with history of hypothyroidism, tobacco and polysubstance abuse. Per chart review patient lives alone. 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 She presented 12/24/2015 with fall and right sided weakness. Urine drug screen positive for cocaine. CT of the head showed a 3-4 millimeter hyperdense focus in the left caudate head and anterior body that could represent possible small vessel caudate infarct with microhemorrhage. Echocardiogram with ejection fraction of 60% no wall motion abnormalities and a type1R atrial septal aneurysm felt to be possibly small PFO with left-to-right flow by Doppler. Carotid Dopplers in no ICA stenosis. Patient did not receive TPA. Neurology consulted placed on aspirin for CVA prophylaxis. Subcutaneous Lovenox for DVT prophylaxis. Therapy evaluations initiated noted fatigue lethargy limited ability to participate. M.D. has requested physical medicine rehabilitation consult.  Review of Systems  Constitutional: Negative for fever and chills.  Eyes: Negative for blurred vision and double vision.  Respiratory: Positive for cough. Negative for shortness of breath.   Cardiovascular: Negative for chest pain, palpitations and leg swelling.  Gastrointestinal: Positive for constipation. Negative for nausea and vomiting.  Genitourinary: Negative for dysuria and hematuria.  Musculoskeletal: Positive for myalgias.       Recent fall with right side weakness  Neurological: Positive for dizziness, sensory change, speech change, focal weakness and headaches. Negative for loss of consciousness.  All other systems reviewed and are negative.  Past Medical History  Diagnosis Date  . Headache(784.0)   . Seizures ([place])   . Thyroid cancer ([place]) [address]11  . Bronchitis   . Urinary  tract infection   . Ovarian cyst   . Fibroid   . Abnormal Pap smear   . Trichomonas    Past Surgical History  Procedure Laterality Date  . Abdominal hysterectomy    . Laparoscopic  1988    removal  of ectopic preg, ruptured tube  . Therapeutic abortion    . Cesarean section    . Thyroidectomy  march 2011    cancer  . Goiter      removed   Family History  Problem Relation Age of Onset  . Bell's palsy Mother   . Cancer Father   . Stroke Father    Social History:  reports that she has been smoking Cigarettes.  She has a 12.5 pack-year smoking history. She has never used smokeless tobacco. She reports that she drinks alcohol. She reports that she uses illicit drugs (Cocaine). Allergies:  Allergies  Allergen Reactions  . Iohexol      Code: HIVES, Desc: pts tongue began itching post injection and throat burning, Onset Date: [id number]   . Percocet [Oxycodone-Acetaminophen] Rash   Medications Prior to Admission  Medication Sig Dispense Refill  . aspirin 81 MG tablet Take 81 mg by mouth daily.    . Calcium Carbonate-Vitamin D (CALCIUM + D PO) Take 2 tablets by mouth 2 (two) times daily.     Marland Kitchen levothyroxine (SYNTHROID, LEVOTHROID) 125 MCG tablet Take 1 tablet (125 mcg total) by mouth daily before breakfast. 30 tablet 1    Home: Home Living Family/patient expects to be discharged to:: Unsure Living Arrangements: Alone Type of Home: Other(Comment) (townhome) Home Layout: Two level Alternate Level Stairs-Number of Steps: 20 Alternate Level Stairs-Rails: Left Bathroom Shower/Tub: Chiropodist: Standard Home Equipment:  (mother has shower chair  pt may can use)  Functional History: Prior Function Level of Independence: Independent Functional Status:  Mobility: Bed Mobility Overal bed mobility: Needs Assistance Bed Mobility: Sit to Sidelying, Supine to Sit Supine to sit: Mod assist Sit to sidelying: Modified independent (Device/Increase time) General bed  mobility comments: assist with trunk to come to sitting Transfers Overall transfer level: Needs assistance Transfers: Sit to/from Stand Sit to Stand: Min assist General transfer comment: Min guard-Min assist given      ADL: ADL Overall ADL's : Needs assistance/impaired Upper Body Dressing : Moderate assistance, Sitting Lower Body Dressing: Sit to/from stand Toilet Transfer: Moderate assistance, Ambulation (hand held assist; sit to stand from bed) Functional mobility during ADLs: Moderate assistance (hand held assist) General ADL Comments: encouraged pt to try to use Rt hand functionally.  Cognition: Cognition Overall Cognitive Status:  (unsure of baseline) Orientation Level: Oriented X4 Cognition Arousal/Alertness: Awake/alert Behavior During Therapy: WFL for tasks assessed/performed Overall Cognitive Status:  (unsure of baseline) Area of Impairment: Orientation Orientation Level: Disoriented to, Place, Time  Blood pressure 134/79, pulse 67, temperature 98.1 F (36.7 C), temperature source Oral, resp. rate 18, height 5\' 5"  (1.651 m), weight 74.798 kg (164 lb 14.4 oz), SpO2 95 %. Physical Exam  Vitals reviewed. Constitutional: She appears well-developed and well-nourished.  HENT:  Head: Normocephalic and atraumatic.  Eyes: Conjunctivae and EOM are normal.  Neck: Normal range of motion. Neck supple.  Cardiovascular: Normal rate and regular rhythm.   Respiratory: Effort normal and breath sounds normal. No respiratory distress.  GI: Soft. Bowel sounds are normal. She exhibits no distension.  Musculoskeletal: She exhibits no edema or tenderness.  Neurological: She is alert. A cranial nerve deficit is present.  Patient is a bit lethargic but arousable.  She is able to provide her name, age and date of birth. Fair awareness of deficits. Sensation diminished to light touch R UE Right facial weakness Motor: R UE: Shoulder abduction 3/5, elbow flexion extension 2/5, hand grip  0/5 RLE: Flexion, knee extension 4/5, ankle dorsi/plantar flexion 5/5 LUE/LLE: 5/5 proximal distal DTRs 3+: R UE/RLE  Skin: Skin is warm and dry.  Psychiatric: She has a normal mood and affect. Her behavior is normal.    Results for orders placed or performed during the hospital encounter of 12/24/15 (from the past 24 hour(s))  Glucose, capillary     Status: Abnormal   Collection Time: 12/25/15  6:50 AM  Result Value Ref Range   Glucose-Capillary 103 (H) 65 - 99 mg/dL  Glucose, capillary     Status: Abnormal   Collection Time: 12/25/15 11:52 AM  Result Value Ref Range   Glucose-Capillary 105 (H) 65 - 99 mg/dL  TSH     Status: Abnormal   Collection Time: 12/26/15  4:08 AM  Result Value Ref Range   TSH 18.683 (H) 0.350 - 4.500 uIU/mL  Lipid panel     Status: Abnormal   Collection Time: 12/26/15  4:08 AM  Result Value Ref Range   Cholesterol 228 (H) 0 - 200 mg/dL   Triglycerides 157 (H) <150 mg/dL   HDL 62 >40 mg/dL   Total CHOL/HDL Ratio 3.7 RATIO   VLDL 31 0 - 40 mg/dL   LDL Cholesterol 135 (H) 0 - 99 mg/dL   Ct Head Wo Contrast  12/25/2015  CLINICAL DATA:  Stroke.  Headache.  Seizures.  Right arm weakness. EXAM: CT HEAD WITHOUT CONTRAST TECHNIQUE: Contiguous axial images were obtained from the base of the skull through the vertex without  intravenous contrast. COMPARISON:  12/24/2015. Multiple previous examinations including MRI 10/30/2010. FINDINGS: The brainstem and cerebellum again appear normal. Cerebral hemispheres appear normal MK by CT with the exception of new demonstration of a 3-4 mm hyperdense focus in the left caudate head/anterior body which could represent a tiny infarction with microhemorrhage. No evidence of cortical or large vessel territory infarction. No mass lesion, hemorrhage, hydrocephalus or extra-axial collection. No skull or skullbase lesion. Sinuses are clear. IMPRESSION: Question 3-4 mm hyperdense focus in the left caudate head/ anterior body that could  represent a small-vessel caudate infarction with microhemorrhage. This is not absolutely definite. No other focal finding by CT. Electronically Signed   By: Nelson Chimes M.D.   On: 12/25/2015 14:05   Ct Head Wo Contrast  12/24/2015  CLINICAL DATA:  Right-sided weakness, multiple falls x 48 hours EXAM: CT HEAD WITHOUT CONTRAST TECHNIQUE: Contiguous axial images were obtained from the base of the skull through the vertex without intravenous contrast. COMPARISON:  CT head dated 12/06/2014 FINDINGS: No evidence of parenchymal hemorrhage or extra-axial fluid collection. No mass lesion, mass effect, or midline shift. No CT evidence of acute infarction. Cerebral volume is within normal limits.  No ventriculomegaly. The visualized paranasal sinuses are essentially clear. The mastoid air cells are unopacified. No evidence of calvarial fracture. IMPRESSION: Normal head CT. Electronically Signed   By: Julian Hy M.D.   On: 12/24/2015 20:00    Assessment/Plan: Diagnosis: Left caudate CVA Labs and images independently reviewed.  Records reviewed and summated above. Stroke: Continue secondary stroke prophylaxis and Risk Factor Modification listed below:   Antiplatelet therapy:  Blood Pressure Management:  Continue current medication with prn's with permisive HTN per primary team Statin Agent:   Tobacco abuse:  Cont to counsel Right sided hemiparesis: fit for orthotics to prevent contractures (resting hand splint for day, wrist cock up splint at night, etc) Motor recovery: Fluoxetine  1. Does the need for close, 24 hr/day medical supervision in concert with the patient's rehab needs make it unreasonable for this patient to be served in a less intensive setting? Potentially  2. Co-Morbidities requiring supervision/potential complications: hypothyroidism (ensure mood and energy do not limit functional progress), tobacco abuse (continue to counsel), EtOH abuse (CIWA, continue counsel), cocaine abuse (continue  counsel)  3. Due to safety, disease management, medication administration and patient education, does the patient require 24 hr/day rehab nursing? Yes 4. Does the patient require coordinated care of a physician, rehab nurse, PT (1-2 hrs/day, 5 days/week), OT (1-2 hrs/day, 5 days/week) and SLP (1-2 hrs/day, 5 days/week) to address physical and functional deficits in the context of the above medical diagnosis(es)? Yes Addressing deficits in the following areas: balance, endurance, locomotion, strength, transferring, bathing, dressing, feeding, toileting, speech, swallowing and psychosocial support 5. Can the patient actively participate in an intensive therapy program of at least 3 hrs of therapy per day at least 5 days per week? Likely in the near future 6. The potential for patient to make measurable gains while on inpatient rehab is excellent 7. Anticipated functional outcomes upon discharge from inpatient rehab are TBD  with PT, modified independent and supervision with OT, TBD with SLP. 8. Estimated rehab length of stay to reach the above functional goals is: 16-19 days. 9. Does the patient have adequate social supports and living environment to accommodate these discharge functional goals? Potentially 10. Anticipated D/C setting: Other 11. Anticipated post D/C treatments: TBD 12. Overall Rehab/Functional Prognosis: good  RECOMMENDATIONS: This patient's condition is appropriate for  continued rehabilitative care in the following setting: Pt has been too lethargic to particpate with PT and SLP.  In addition, pt does not appear to have support at discharge.  Will await formal therapy consults, however, if pt does not have adequate support at discharge, would recommend SNF. Patient has agreed to participate in recommended program. Yes Note that insurance prior authorization may be required for reimbursement for recommended care.  Comment: Rehab Admissions Coordinator to follow up.  Delice Lesch,  MD 12/26/2015

## 2015-12-26 NOTE — Evaluation (Signed)
Physical Therapy Evaluation Patient Details Name: Theresa Watkins MRN: [id number] DOB: July 28, 1966 Today's Date: 12/26/2015   History of Present Illness  50 y.o. female presented with new onset right facial and upper extremity weakness, as well as slurred speech. Pt was smoking crack when symptoms began. CT head + Lt caudate CVA  PMHx-CVA, hypertension, thyroid cancer, cocaine use, UTI, seizures, trichomonas, and headache.    Clinical Impression  Pt admitted with above diagnosis. Patient very impulsive. Fatigued quickly with Rt knee buckling and mod assist to return to room. Pt currently with functional limitations due to the deficits listed below (see PT Problem List).  Pt will benefit from skilled PT to increase their independence and safety with mobility to allow discharge to the venue listed below.       Follow Up Recommendations CIR    Equipment Recommendations   (TBA)    Recommendations for Other Services       Precautions / Restrictions Precautions Precautions: Fall Precaution Comments: risk for Rt shoulder subluxation      Mobility  Bed Mobility Overal bed mobility: Needs Assistance Bed Mobility: Supine to Sit;Sit to Supine     Supine to sit: Min guard;HOB elevated Sit to supine: Supervision   General bed mobility comments: very impulsive and moves quickly; not protecting RUE and nearly rolled over onto it  Transfers Overall transfer level: Needs assistance Equipment used: 1 person hand held assist Transfers: Sit to/from [facility] Sit to Stand: Min assist Stand pivot transfers: Min assist       General transfer comment: RUE supported to protect shoulder  Ambulation/Gait Ambulation/Gait assistance: Mod assist Ambulation Distance (Feet): 35 Feet (standing rest, 35) Assistive device: 1 person hand held assist (rail in hall to return) Gait Pattern/deviations: Step-through pattern;Step-to pattern;Decreased stride length;Trendelenburg   Gait  velocity interpretation: Below normal speed for age/gender General Gait Details: initially no knee buckling, however after 35 ft +Rt knee buckling and Rt hip trendelenburg with incr LOB and mod assit to return to room  Stairs            Wheelchair Mobility    Modified Rankin (Stroke Patients Only) Modified Rankin (Stroke Patients Only) Pre-Morbid Rankin Score: No symptoms Modified Rankin: Moderately severe disability     Balance Overall balance assessment: Needs assistance Sitting-balance support: No upper extremity supported;Feet unsupported Sitting balance-Leahy Scale: Good Sitting balance - Comments: dyscoordinated movements through torso/pelvis when shifting/scooting   Standing balance support: Single extremity supported Standing balance-Leahy Scale: Poor                               Pertinent Vitals/Pain Pain Assessment: Faces Faces Pain Scale: Hurts little more Pain Location: Rt shoulder and anterior chest Pain Descriptors / Indicators: Discomfort;Grimacing Pain Intervention(s): Limited activity within patient's tolerance;Monitored during session;Repositioned (educated on supporting RUE )    Home Living Family/patient expects to be discharged to:: Unsure Living Arrangements: Alone   Type of Home: Apartment (townhouse)       Home Layout: Two level Home Equipment: Other (comment) (can borrow shower chair)      Prior Function Level of Independence: Independent               Hand Dominance   Dominant Hand: Right    Extremity/Trunk Assessment   Upper Extremity Assessment: Defer to OT evaluation           Lower Extremity Assessment: RLE deficits/detail RLE Deficits / Details: AROM  WFL; hip abdct 3+ (sidelying), flexion 4/5; knee extension 4/5, DF 5/5    Cervical / Trunk Assessment: Other exceptions  Communication   Communication: Expressive difficulties  Cognition Arousal/Alertness: Awake/alert Behavior During Therapy:  Impulsive Overall Cognitive Status: No family/caregiver present to determine baseline cognitive functioning Area of Impairment: Attention;Safety/judgement   Current Attention Level: Sustained     Safety/Judgement: Decreased awareness of safety     General Comments: moves quickly without re; for balance and RLE deficits    General Comments      Exercises        Assessment/Plan    PT Assessment Patient needs continued PT services  PT Diagnosis Hemiplegia dominant side   PT Problem List Decreased strength;Decreased activity tolerance;Decreased balance;Decreased mobility;Decreased cognition;Decreased knowledge of use of DME;Decreased safety awareness;Decreased knowledge of precautions;Impaired sensation;Impaired tone;Pain  PT Treatment Interventions DME instruction;Gait training;Stair training;Functional mobility training;Therapeutic activities;Balance training;Neuromuscular re-education;Cognitive remediation;Patient/family education   PT Goals (Current goals can be found in the Care Plan section) Acute Rehab PT Goals Patient Stated Goal: get her arm working again PT Goal Formulation: With patient Time For Goal Achievement: 01/02/16 Potential to Achieve Goals: Good    Frequency Min 4X/week   Barriers to discharge Decreased caregiver support      Co-evaluation               End of Session Equipment Utilized During Treatment: Gait belt Activity Tolerance: Patient limited by fatigue Patient left: in chair;with call bell/phone within reach;with chair alarm set Nurse Communication: Mobility status         Time: 1453-1531 PT Time Calculation (min) (ACUTE ONLY): 38 min   Charges:   PT Evaluation $PT Eval High Complexity: 1 Procedure PT Treatments $Gait Training: 23-37 mins   PT G Codes:        Treyven Lafauci 2016/01/23, 3:52 PM Pager [phone]

## 2015-12-26 NOTE — Progress Notes (Signed)
I await P.T. Evaluation to assist with planning dispo needs. [id number]

## 2015-12-26 NOTE — Progress Notes (Signed)
PT Cancellation Note  Patient Details Name: Theresa Watkins MRN: [id number] DOB: 1965-12-25   Cancelled Treatment:    Reason Eval/Treat Not Completed: Patient at procedure or test/unavailable Working with SLP. Will return   [name] 12/26/2015, 10:53 AM Pager [phone]

## 2015-12-26 NOTE — Evaluation (Signed)
Speech Language Pathology Evaluation Patient Details Name: Theresa Watkins MRN: [id number] DOB: Nov 21, 1965 Today's Date: 12/26/2015 Time: [id number] SLP Time Calculation (min) (ACUTE ONLY): 20 min  Problem List:  Patient Active Problem List   Diagnosis Date Noted  . Hemiparesis affecting dominant side as late effect of stroke ([place])   . 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 Tobacco abuse   . Cocaine abuse   . ETOH abuse   . Hypothyroidism (acquired) 12/24/2015  . Acute ischemic stroke ([place]) 12/24/2015  . Stroke ([place]) 12/24/2015  . Drug abuse, cocaine type 05/09/2014   Past Medical History:  Past Medical History  Diagnosis Date  . Headache(784.0)   . Seizures ([place])   . Thyroid cancer ([place]) 20011  . Bronchitis   . Urinary tract infection   . Ovarian cyst   . Fibroid   . Abnormal Pap smear   . Trichomonas    Past Surgical History:  Past Surgical History  Procedure Laterality Date  . Abdominal hysterectomy    . Laparoscopic  1988    removal  of ectopic preg, ruptured tube  . Therapeutic abortion    . Cesarean section    . Thyroidectomy  march 2011    cancer  . Goiter      removed   HPI:  Theresa Watkins is a 50 y.o. female  who presents with right sided weakness and facial droop.  Stroke code was not called because the patient had presented outside the TPA window. CT of the head without contrast was unremarkable. The patient was discussed with neurology who recommended stroke workup and transfer to [facility]. The patient reports a "mini stroke" years ago. She smokes socially. She takes a baby aspirin frequently. She does not have a regular PCP.   Assessment / Plan / Recommendation Clinical Impression   Pt presents with a mild-moderate dysarthria characterized by right sided labial, lingual, and facial weakness which impacts articulatory precision at the phrase to sentence level.  Pt also presents with impaired coordination of articulatory movement and  diadochokinetic rate as well as questionable velar involvement with subtle hyponasality.  Cognition not formally assessed at this time but appears to be grossly intact for basic, familiar tasks.  Recommend formalized cognitive assessment as tolerated by pt (eval limited today due to complaints of "severe" chest pain and ended early) as pt lived independently prior to admission.  Pt would benefit from follow up ST services, either on an inpatient or outpatient basis to address dysarthria and diagnostic treatment of cognitive function.      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)   Cognition  Overall Cognitive Status: Within Functional Limits for tasks assessed (informally assessed, formal testing warranted as tolerated ) Arousal/Alertness: Awake/alert Orientation Level: Oriented X4 Attention: Selective Selective Attention: Appears intact    Comprehension  Auditory Comprehension Overall Auditory Comprehension: Appears within functional limits for tasks assessed    Expression Expression Primary Mode of Expression: Verbal Verbal Expression Overall Verbal Expression: Appears within functional limits for tasks assessed   Oral / Motor  Oral Motor/Sensory Function Overall Oral Motor/Sensory Function: Moderate impairment Facial ROM: Reduced right Facial Symmetry: Abnormal symmetry  right Facial Strength: Reduced right Lingual ROM: Within Functional Limits Lingual Symmetry: Abnormal symmetry right Lingual Strength: Reduced Motor Speech Overall Motor Speech: Impaired Respiration: Within functional limits Phonation: Low vocal  intensity Resonance: Hyponasality Articulation: Impaired Level of Impairment: Phrase Intelligibility: Intelligibility reduced Sentence: 75-100% accurate Conversation: 50-74% accurate Motor Planning: Impaired Level of Impairment: Phrase Motor Speech Errors: Aware;Consistent   GO                    Kambre Messner, Selinda Orion 12/26/2015, 11:13 AM

## 2015-12-26 NOTE — Progress Notes (Signed)
PROGRESS NOTE  Theresa Watkins [id number] DOB: 1966/02/22 DOA: 12/24/2015 PCP: No PCP Per Patient  Assessment/Plan: Acute CVA:  Symptoms occurred after smoking crack/cocaine  Stroke clinically with right arm weakness and slurred speech. Passed swallow screen. - telemetry -Daily aspirin enteric coated 325 mg  -Carotid doppler: 1-39% ICA plaquing (soft plaque CCA and origin ICA).  -MRI/MRA ordered but patient unable to tolerate -A repeat CT scan of brain performed on 12/25/2015 showing area in the left caudate but could represent small vessel infarction. -Echocardiogram, performed on 12/25/2015 showing LVEF of 55-60% probable small PFO -PT/OT/SLP consultations  -Stroke team following -Consulted CIR  Chest pain -On my evaluation today patient reported having right-sided chest pain with typical features. Her chest pain was reproducible to palpation. -I ordered a stat EKG which did not reveal acute ischemic changes. Prolonged QTC. -Chest x-ray was negative -Pending troponin level  Hypothyroidism:  S/p thyroidectomy for thyroid cancer. Patient without recent PCP follow up. Continue home levothyroxine.  Check TSH  Smoking:  Counseled complete cessation. -Nicotine replacement, low dose  Erythrocytosis: Suspect hemoconcentration. Repeat CBC after fluids  Cocaine abuse -Extensively counseled    Code Status: full Family Communication: patient  Disposition Plan: await PT eval   Consultants:  neuro   HPI/Subjective: Patient reporting having right-sided chest pain characterized as sharp and stabbing that was of sudden onset nonradiating  Objective: Filed Vitals:   12/26/15 0630 12/26/15 0900  BP: 133/84 122/68  Pulse: 62 71  Temp: 98.3 F (36.8 C) 99.1 F (37.3 C)  Resp: 18 18   No intake or output data in the 24 hours ending 12/26/15 1238 Filed Weights   12/25/15 0830  Weight: 74.798 kg (164 lb 14.4 oz)    Exam:   General:  Slurred speech, has  right-sided facial droop.  Cardiovascular: rrr  Respiratory: clear  Abdomen: +BS  Musculoskeletal: right arm weakness    Neurological: She has flaccid paralysis of right upper extremity along with right-sided facial droop. Slurred speech is evident. Left upper extremity and bilateral extremities intact strength.  Data Reviewed: Basic Metabolic Panel:  Recent Labs Lab 12/24/15 2005 12/24/15 2013  NA 144 142  K 4.0 3.8  CL 109 108  CO2 21*  --   GLUCOSE 89 84  BUN 16 18  CREATININE 0.91 0.90  CALCIUM 9.2  --    Liver Function Tests:  Recent Labs Lab 12/24/15 2005  AST 18  ALT 12*  ALKPHOS 65  BILITOT 0.6  PROT 7.3  ALBUMIN 4.2   No results for input(s): LIPASE, AMYLASE in the last 168 hours. No results for input(s): AMMONIA in the last 168 hours. CBC:  Recent Labs Lab 12/24/15 2005 12/24/15 2013  WBC 6.9  --   NEUTROABS 4.3  --   HGB 15.8* 17.7*  HCT 46.1* 52.0*  MCV 85.5  --   PLT 181  --    Cardiac Enzymes:  Recent Labs Lab 12/26/15 1126  TROPONINI <0.03   BNP (last 3 results) No results for input(s): BNP in the last 8760 hours.  ProBNP (last 3 results) No results for input(s): PROBNP in the last 8760 hours.  CBG:  Recent Labs Lab 12/25/15 0020 12/25/15 0650 12/25/15 1152  GLUCAP 123* 103* 105*    No results found for this or any previous visit (from the past 240 hour(s)).   Studies: Dg Chest 1 View  12/26/2015  CLINICAL DATA:  50 year old female with right upper chest pain since 1100 hours today. Initial encounter. EXAM:  CHEST 1 VIEW COMPARISON:  10/30/2010 and earlier. FINDINGS: Portable AP semi upright view at 1119 hours. Lung volumes are stable and within normal limits. Normal cardiac size and mediastinal contours. Visualized tracheal air column is within normal limits. No pneumothorax, pulmonary edema, pleural effusion or confluent pulmonary opacity. IMPRESSION: No acute cardiopulmonary abnormality. Electronically Signed   By: Genevie Ann M.D.   On: 12/26/2015 11:52   Ct Head Wo Contrast  12/25/2015  CLINICAL DATA:  Stroke.  Headache.  Seizures.  Right arm weakness. EXAM: CT HEAD WITHOUT CONTRAST TECHNIQUE: Contiguous axial images were obtained from the base of the skull through the vertex without intravenous contrast. COMPARISON:  12/24/2015. Multiple previous examinations including MRI 10/30/2010. FINDINGS: The brainstem and cerebellum again appear normal. Cerebral hemispheres appear normal MK by CT with the exception of new demonstration of a 3-4 mm hyperdense focus in the left caudate head/anterior body which could represent a tiny infarction with microhemorrhage. No evidence of cortical or large vessel territory infarction. No mass lesion, hemorrhage, hydrocephalus or extra-axial collection. No skull or skullbase lesion. Sinuses are clear. IMPRESSION: Question 3-4 mm hyperdense focus in the left caudate head/ anterior body that could represent a small-vessel caudate infarction with microhemorrhage. This is not absolutely definite. No other focal finding by CT. Electronically Signed   By: Nelson Chimes M.D.   On: 12/25/2015 14:05   Ct Head Wo Contrast  12/24/2015  CLINICAL DATA:  Right-sided weakness, multiple falls x 48 hours EXAM: CT HEAD WITHOUT CONTRAST TECHNIQUE: Contiguous axial images were obtained from the base of the skull through the vertex without intravenous contrast. COMPARISON:  CT head dated 12/06/2014 FINDINGS: No evidence of parenchymal hemorrhage or extra-axial fluid collection. No mass lesion, mass effect, or midline shift. No CT evidence of acute infarction. Cerebral volume is within normal limits.  No ventriculomegaly. The visualized paranasal sinuses are essentially clear. The mastoid air cells are unopacified. No evidence of calvarial fracture. IMPRESSION: Normal head CT. Electronically Signed   By: Julian Hy M.D.   On: 12/24/2015 20:00    Scheduled Meds: . aspirin EC  325 mg Oral Daily  . enoxaparin  (LOVENOX) injection  40 mg Subcutaneous QHS  . levothyroxine  125 mcg Oral QAC breakfast  . nicotine  7 mg Transdermal Daily   Continuous Infusions:  Antibiotics Given (last 72 hours)    None      Principal Problem:   Acute ischemic stroke (HCC) Active Problems:   Drug abuse, cocaine type   Hypothyroidism (acquired)   Stroke ([place])   Hemiparesis affecting dominant side as late effect of stroke ([place])   Dysarthria due to cerebrovascular accident (CVA) ([place])   Tobacco abuse   Cocaine abuse   ETOH abuse    Time spent: 35 min    Kelvin Cellar  [facility] Pager [phone]. If 7PM-7AM, please contact night-coverage at [email or web address], password [facility] 12/26/2015, 12:38 PM  LOS: 2 days

## 2015-12-27 ENCOUNTER — Inpatient Hospital Stay (HOSPITAL_COMMUNITY)
Admission: AD | Admit: 2015-12-27 | Discharge: 2016-01-02 | DRG: 057 | Disposition: A | Discharge status: Home Health | Payer: Medicaid Other | Source: Intra-hospital | Attending: Physical Medicine & Rehabilitation | Admitting: Physical Medicine & Rehabilitation

## 2015-12-27 DIAGNOSIS — F17210 Nicotine dependence, cigarettes, uncomplicated: Secondary | ICD-10-CM | POA: Diagnosis not present

## 2015-12-27 DIAGNOSIS — Z85850 Personal history of malignant neoplasm of thyroid: Secondary | ICD-10-CM | POA: Diagnosis not present

## 2015-12-27 DIAGNOSIS — K0889 Other specified disorders of teeth and supporting structures: Secondary | ICD-10-CM | POA: Diagnosis not present

## 2015-12-27 DIAGNOSIS — I63312 Cerebral infarction due to thrombosis of left middle cerebral artery: Secondary | ICD-10-CM | POA: Diagnosis not present

## 2015-12-27 DIAGNOSIS — M722 Plantar fascial fibromatosis: Secondary | ICD-10-CM | POA: Diagnosis not present

## 2015-12-27 DIAGNOSIS — E039 Hypothyroidism, unspecified: Secondary | ICD-10-CM | POA: Diagnosis not present

## 2015-12-27 DIAGNOSIS — Z8719 Personal history of other diseases of the digestive system: Secondary | ICD-10-CM | POA: Diagnosis not present

## 2015-12-27 DIAGNOSIS — E038 Other specified hypothyroidism: Secondary | ICD-10-CM | POA: Diagnosis not present

## 2015-12-27 DIAGNOSIS — Z79899 Other long term (current) drug therapy: Secondary | ICD-10-CM

## 2015-12-27 DIAGNOSIS — I639 Cerebral infarction, unspecified: Secondary | ICD-10-CM | POA: Diagnosis not present

## 2015-12-27 DIAGNOSIS — E034 Atrophy of thyroid (acquired): Secondary | ICD-10-CM

## 2015-12-27 DIAGNOSIS — E785 Hyperlipidemia, unspecified: Secondary | ICD-10-CM | POA: Diagnosis not present

## 2015-12-27 DIAGNOSIS — I69351 Hemiplegia and hemiparesis following cerebral infarction affecting right dominant side: Secondary | ICD-10-CM | POA: Diagnosis present

## 2015-12-27 DIAGNOSIS — Z720 Tobacco use: Secondary | ICD-10-CM

## 2015-12-27 DIAGNOSIS — E8809 Other disorders of plasma-protein metabolism, not elsewhere classified: Secondary | ICD-10-CM

## 2015-12-27 DIAGNOSIS — R471 Dysarthria and anarthria: Secondary | ICD-10-CM

## 2015-12-27 DIAGNOSIS — F1410 Cocaine abuse, uncomplicated: Secondary | ICD-10-CM

## 2015-12-27 DIAGNOSIS — I69359 Hemiplegia and hemiparesis following cerebral infarction affecting unspecified side: Secondary | ICD-10-CM

## 2015-12-27 DIAGNOSIS — I69322 Dysarthria following cerebral infarction: Secondary | ICD-10-CM

## 2015-12-27 DIAGNOSIS — I69392 Facial weakness following cerebral infarction: Secondary | ICD-10-CM

## 2015-12-27 DIAGNOSIS — Z7982 Long term (current) use of aspirin: Secondary | ICD-10-CM

## 2015-12-27 DIAGNOSIS — F1010 Alcohol abuse, uncomplicated: Secondary | ICD-10-CM | POA: Diagnosis present

## 2015-12-27 DIAGNOSIS — IMO0002 Reserved for concepts with insufficient information to code with codable children: Secondary | ICD-10-CM | POA: Diagnosis present

## 2015-12-27 LAB — BASIC METABOLIC PANEL
Anion gap: 11 (ref 5–15)
BUN: 10 mg/dL (ref 6–20)
CHLORIDE: 110 mmol/L (ref 101–111)
CO2: 22 mmol/L (ref 22–32)
Calcium: 8.7 mg/dL — ABNORMAL LOW (ref 8.9–10.3)
Creatinine, Ser: 0.78 mg/dL (ref 0.44–1.00)
GFR calc Af Amer: >60 mL/min (ref >60)
GFR calc non Af Amer: >60 mL/min (ref >60)
GLUCOSE: 99 mg/dL (ref 65–99)
POTASSIUM: 3.8 mmol/L (ref 3.5–5.1)
Sodium: 143 mmol/L (ref 135–145)

## 2015-12-27 LAB — CBC
HEMATOCRIT: 43.8 % (ref 36.0–46.0)
Hemoglobin: 14.3 g/dL (ref 12.0–15.0)
MCH: 28.1 pg (ref 26.0–34.0)
MCHC: 32.6 g/dL (ref 30.0–36.0)
MCV: 86.1 fL (ref 78.0–100.0)
Platelets: 172 10*3/uL (ref 150–400)
RBC: 5.09 MIL/uL (ref 3.87–5.11)
RDW: 13.4 % (ref 11.5–15.5)
WBC: 5.7 10*3/uL (ref 4.0–10.5)

## 2015-12-27 LAB — HEMOGLOBIN A1C
Hgb A1c MFr Bld: 5.8 % — ABNORMAL HIGH (ref 4.8–5.6)
Mean Plasma Glucose: 120 mg/dL

## 2015-12-27 MED ORDER — ATORVASTATIN CALCIUM 40 MG PO TABS: 40.0000 mg | ORAL_TABLET | Freq: Every day | ORAL | Status: DC

## 2015-12-27 MED ORDER — NICOTINE 7 MG/24HR TD PT24
7.0000 mg | MEDICATED_PATCH | Freq: Every day | TRANSDERMAL | Status: DC
  Administered 2015-12-28 – 2016-01-02 (×5): 7 mg via TRANSDERMAL
  Filled 2015-12-27 (×8): qty 1

## 2015-12-27 MED ORDER — SORBITOL 70 % SOLN
30.0000 mL | Freq: Every day | Status: DC | PRN
  Administered 2015-12-31: 30 mL via ORAL
  Filled 2015-12-27: qty 30

## 2015-12-27 MED ORDER — ASPIRIN EC 325 MG PO TBEC
325.0000 mg | DELAYED_RELEASE_TABLET | Freq: Every day | ORAL | Status: DC
  Administered 2015-12-28 – 2016-01-02 (×6): 325 mg via ORAL
  Filled 2015-12-27 (×6): qty 1

## 2015-12-27 MED ORDER — LEVOTHYROXINE SODIUM 137 MCG PO TABS: 137.0000 ug | ORAL_TABLET | Freq: Every day | ORAL | Status: DC

## 2015-12-27 MED ORDER — SENNOSIDES-DOCUSATE SODIUM 8.6-50 MG PO TABS: 1.0000 | ORAL_TABLET | Freq: Every evening | ORAL | Status: DC | PRN

## 2015-12-27 MED ORDER — NICOTINE 7 MG/24HR TD PT24: 7.0000 mg | MEDICATED_PATCH | Freq: Every day | TRANSDERMAL | Status: DC

## 2015-12-27 MED ORDER — ONDANSETRON HCL 4 MG PO TABS: 4.0000 mg | ORAL_TABLET | Freq: Four times a day (QID) | ORAL | Status: DC | PRN

## 2015-12-27 MED ORDER — ATORVASTATIN CALCIUM 40 MG PO TABS
40.0000 mg | ORAL_TABLET | Freq: Every day | ORAL | Status: DC
  Administered 2015-12-29: 40 mg via ORAL
  Filled 2015-12-27 (×3): qty 1

## 2015-12-27 MED ORDER — ACETAMINOPHEN 650 MG RE SUPP: 650.0000 mg | RECTAL | Status: DC | PRN

## 2015-12-27 MED ORDER — ONDANSETRON HCL 4 MG/2ML IJ SOLN: 4.0000 mg | Freq: Four times a day (QID) | INTRAMUSCULAR | Status: DC | PRN

## 2015-12-27 MED ORDER — ASPIRIN 325 MG PO TBEC: 325.0000 mg | DELAYED_RELEASE_TABLET | Freq: Every day | ORAL | Status: DC

## 2015-12-27 MED ORDER — LEVOTHYROXINE SODIUM 25 MCG PO TABS
125.0000 ug | ORAL_TABLET | Freq: Every day | ORAL | Status: DC
  Administered 2015-12-28 – 2016-01-02 (×6): 125 ug via ORAL
  Filled 2015-12-27 (×6): qty 1

## 2015-12-27 MED ORDER — ENOXAPARIN SODIUM 40 MG/0.4ML ~~LOC~~ SOLN
40.0000 mg | Freq: Every day | SUBCUTANEOUS | Status: DC
  Administered 2015-12-27 – 2016-01-01 (×6): 40 mg via SUBCUTANEOUS
  Filled 2015-12-27 (×6): qty 0.4

## 2015-12-27 MED ORDER — ACETAMINOPHEN 325 MG PO TABS
650.0000 mg | ORAL_TABLET | ORAL | Status: DC | PRN
  Administered 2015-12-27: 650 mg via ORAL
  Filled 2015-12-27 (×2): qty 2

## 2015-12-27 MED ORDER — ENOXAPARIN SODIUM 40 MG/0.4ML ~~LOC~~ SOLN: 40.0000 mg | SUBCUTANEOUS | Status: DC

## 2015-12-27 NOTE — Discharge Summary (Addendum)
Physician Discharge Summary  Theresa Watkins [id number] DOB: June 22, 1966 DOA: 12/24/2015  PCP: No PCP Per Patient  Admit date: 12/24/2015 Discharge date: 12/27/2015  Time spent: Greater than 30 minutes  Recommendations for Outpatient Follow-up:  1. Dr. Antony Contras, Neurology in 2 months for stroke follow-up. 2. PCP, upon discharge from CIR. Discussed with CIR coordinator today and indicated that patient will need a new PCP appointment prior to discharge from [facility]. 3. Recommend repeat TSH in 4-6 weeks. 4. Periodically follow EKG for QTC prolongation and avoid medications that would prolong it. 5. Recommend bilateral lower extremity venous Doppler in 1 week and please see discussion below.  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  Filed Weights   12/25/15 0830  Weight: 74.798 kg (164 lb 14.4 oz)    History of present illness:  50 year old right-handed female, PMH of thyroid cancer status post thyroidectomy with resultant hypothyroidism, tobacco and substance/cocaine abuse, HTN, prior stroke, admitted to [facility] on 12/24/15 with complaints of right-sided weakness and facial droop. She apparently used some cocaine on the morning of admission and noticed the symptoms later that evening. She apparently had some falls PTA. She was admitted and completed full stroke evaluation. Neurology was consulted. Patient being discharged to CIR.  Hospital Course:   1. Acute left brain stroke: With resultant right hemiparesis. Patient unable to tolerate MRI/MRA. CT head initially was unrevealing. However repeat CT head on 12/25/15 showed  question 3-4 millimeter hyperdense focus in the left caudate head/anterior body that could represent a small vessel caudate infarction with microhemorrhage. In the absence of MRI, CT angiogram of the head was preferred but patient was allergic to dye and hence noncontrasted CT was repeated. Carotid Dopplers: No significant stenosis. 2-D echo: No source of clot, likely small PFO. LDL 135 and newly started on statins. Hemoglobin A1c: 5.8. Patient was on aspirin 81 MG daily prior to admission which has now been increased to 325 MG daily for secondary stroke prevention. Neurology evaluated and indicated that stroke workup has been completed. They have cleared her for discharge and will follow-up as outpatient. Patient was out of window for TPA. Therapies evaluated patient and patient will be going to CIR today.  2. Tobacco abuse: Cessation counseled. Continue nicotine patch. 3. Cocaine abuse: Patient states that she had quit doing cocaine but did smoke some on day of admission due to pressure from friend. Cessation counseled. UDS was positive for cocaine. 4. Alcohol use: Counseled regarding moderation or abstinence and she verbalized understanding. Blood alcohol level negative on admission. 5. History of prior stroke: Management per problem #1 6. Hyperlipidemia: LDL 135, goal <70. Started statins. 7. Thyroid cancer, status post thyroidectomy with resultant hypothyroidism: TSH 18.68. Patient claims compliance with Synthroid. We will increase Synthroid from 125 g daily to 137 g daily. Recommend follow-up of TSH in 4-6 weeks. Clinically appears euthyroid. 8. Essential hypertension, newly diagnosed: Allowed for permissive hypertension due to recent acute stroke. Monitor closely after discharge. 9. Chest pain: Patient reported chest pain on 2/7 which was reproducible to palpation. This has since resolved. Troponin 3 negative. Chest x-ray without acute findings.  No acute ischemic changes on EKG. As per RN, chest  pain promptly resolved after a dose of Maalox yesterday. 10. Prolonged QTC: EKG on 2/7: QTC 530 milliseconds. Avoid QTC prolonging medications. Repeat EKG shows QTC 503 ms. Will need periodic monitoring as needed. 11. Small PFO: Seen on 2-D echocardiogram. Discussed with Dr. Leonie Man. Recommends obtaining bilateral lower extremity venous Dopplers in a week to look for DVT and if positive, consider anticoagulation at that time. However would not be a candidate for anticoagulation currently due to speck of hemorrhage seen on CT head. For now continue antiplatelets.  Consultations:  Neurology  Rehabilitation M.D.  Procedures:  2-D echo to/6/17: Study Conclusions  - Left ventricle: The cavity size was normal. Wall thickness was normal. Systolic function was normal. The estimated ejection fraction was in the range of 55% to 60%. Wall motion was normal; there were no regional wall motion abnormalities. Left ventricular diastolic function parameters were normal. - Left atrium: The atrium was normal in size. - Atrial septum: Type 1R atrial septal aneurysm - small PFO is suspected.  Impressions:  - Essentially normal echo, except for a Type 1R atrial septal aneurysm - there is likely a small PFO with left to right flow by color doppler. Recommend TEE with bubble study to assess further.   Carotid Dopplers 12/25/15: Summary: Bilateral: mild soft plaque throughout CCA. Mild to moderate soft plaque origin and proximal ICA and ECA. Left ECA not insonated. Tortuous left ICA. 1-39% ICA plaquing. Vertebral artery flow is antegrade.   Discharge Exam:  Complaints: Right upper extremity weakness-improved compared to admission. Denies any other complaints. No further chest pain or dyspnea since yesterday.  Filed Vitals:   12/26/15 2243 12/27/15 0247 12/27/15 0631 12/27/15 1044  BP: 141/93 137/73 140/90 123/86  Pulse: 71 79 67 74  Temp: 98.5 F (36.9 C) 98 F (36.7 C) 97.9 F  (36.6 C) 98.3 F (36.8 C)  TempSrc: Oral Oral Oral Oral  Resp: 18 18 18 16   Height:      Weight:      SpO2: 100% 98% 99% 95%    General exam: Small built and moderately nourished pleasant young female lying comfortably propped up in bed. Respiratory system: Clear. No increased work of breathing. Cardiovascular system: S1 & S2 heard, RRR. No JVD, murmurs, gallops, clicks or pedal edema. Telemetry: Sinus rhythm. Gastrointestinal system: Abdomen is nondistended, soft and nontender. Normal bowel sounds heard. Central nervous system: Alert and oriented. Right lower facial mild weakness. Extremities: Symmetric 5 x 5 power except right upper extremity where proximally grade 3/5 power at least but weaker distally with decreased grip.  Discharge Instructions      Discharge Instructions    Ambulatory referral to Neurology    Complete by:  As directed   Please schedule post stroke follow up in 2 months.     Call MD for:  difficulty breathing, headache or visual disturbances    Complete by:  As directed      Call MD for:  extreme fatigue    Complete by:  As directed      Call MD for:  persistant dizziness or light-headedness    Complete by:  As directed      Call MD for:  persistant nausea and vomiting    Complete by:  As directed      Call MD for:  severe uncontrolled pain    Complete by:  As directed      Call MD for:  temperature >100.4  Complete by:  As directed      Call MD for:    Complete by:  As directed   Stroke like symptoms.     Diet - low sodium heart healthy    Complete by:  As directed      Increase activity slowly    Complete by:  As directed             Medication List    STOP taking these medications        aspirin 81 MG tablet  Replaced by:  aspirin 325 MG EC tablet      TAKE these medications        aspirin 325 MG EC tablet  Take 1 tablet (325 mg total) by mouth daily.     atorvastatin 40 MG tablet  Commonly known as:  LIPITOR  Take 1 tablet (40  mg total) by mouth daily at 6 PM.     CALCIUM + D PO  Take 2 tablets by mouth 2 (two) times daily.     levothyroxine 137 MCG tablet  Commonly known as:  SYNTHROID, LEVOTHROID  Take 1 tablet (137 mcg total) by mouth daily before breakfast.     nicotine 7 mg/24hr patch  Commonly known as:  NICODERM CQ - dosed in mg/24 hr  Place 1 patch (7 mg total) onto the skin daily.       Follow-up Information    Follow up with SETHI,PRAMOD, MD In 2 months.   Specialties:  Neurology, Radiology   Why:  Stroke Clinic, Office will call you with appointment date & time   Contact information:   [address] [place] [address] [phone]       Schedule an appointment as soon as possible for a visit with Family Doctor.   Why:  Upon discharge from rehab.       The results of significant diagnostics from this hospitalization (including imaging, microbiology, ancillary and laboratory) are listed below for reference.    Significant Diagnostic Studies: Dg Chest 1 View  12/26/2015  CLINICAL DATA:  50 year old female with right upper chest pain since 1100 hours today. Initial encounter. EXAM: CHEST 1 VIEW COMPARISON:  10/30/2010 and earlier. FINDINGS: Portable AP semi upright view at 1119 hours. Lung volumes are stable and within normal limits. Normal cardiac size and mediastinal contours. Visualized tracheal air column is within normal limits. No pneumothorax, pulmonary edema, pleural effusion or confluent pulmonary opacity. IMPRESSION: No acute cardiopulmonary abnormality. Electronically Signed   By: Genevie Ann M.D.   On: 12/26/2015 11:52   Ct Head Wo Contrast  12/25/2015  CLINICAL DATA:  Stroke.  Headache.  Seizures.  Right arm weakness. EXAM: CT HEAD WITHOUT CONTRAST TECHNIQUE: Contiguous axial images were obtained from the base of the skull through the vertex without intravenous contrast. COMPARISON:  12/24/2015. Multiple previous examinations including MRI 10/30/2010. FINDINGS: The  brainstem and cerebellum again appear normal. Cerebral hemispheres appear normal MK by CT with the exception of new demonstration of a 3-4 mm hyperdense focus in the left caudate head/anterior body which could represent a tiny infarction with microhemorrhage. No evidence of cortical or large vessel territory infarction. No mass lesion, hemorrhage, hydrocephalus or extra-axial collection. No skull or skullbase lesion. Sinuses are clear. IMPRESSION: Question 3-4 mm hyperdense focus in the left caudate head/ anterior body that could represent a small-vessel caudate infarction with microhemorrhage. This is not absolutely definite. No other focal finding by CT. Electronically Signed   By: Elta Guadeloupe  Shogry M.D.   On: 12/25/2015 14:05   Ct Head Wo Contrast  12/24/2015  CLINICAL DATA:  Right-sided weakness, multiple falls x 48 hours EXAM: CT HEAD WITHOUT CONTRAST TECHNIQUE: Contiguous axial images were obtained from the base of the skull through the vertex without intravenous contrast. COMPARISON:  CT head dated 12/06/2014 FINDINGS: No evidence of parenchymal hemorrhage or extra-axial fluid collection. No mass lesion, mass effect, or midline shift. No CT evidence of acute infarction. Cerebral volume is within normal limits.  No ventriculomegaly. The visualized paranasal sinuses are essentially clear. The mastoid air cells are unopacified. No evidence of calvarial fracture. IMPRESSION: Normal head CT. Electronically Signed   By: Julian Hy M.D.   On: 12/24/2015 20:00    Microbiology: No results found for this or any previous visit (from the past 240 hour(s)).   Labs: Basic Metabolic Panel:  Recent Labs Lab 12/24/15 2005 12/24/15 2013 12/27/15 0526  NA 144 142 143  K 4.0 3.8 3.8  CL 109 108 110  CO2 21*  --  22  GLUCOSE 89 84 99  BUN 16 18 10   CREATININE 0.91 0.90 0.78  CALCIUM 9.2  --  8.7*   Liver Function Tests:  Recent Labs Lab 12/24/15 2005  AST 18  ALT 12*  ALKPHOS 65  BILITOT 0.6   PROT 7.3  ALBUMIN 4.2   No results for input(s): LIPASE, AMYLASE in the last 168 hours. No results for input(s): AMMONIA in the last 168 hours. CBC:  Recent Labs Lab 12/24/15 2005 12/24/15 2013 12/27/15 0526  WBC 6.9  --  5.7  NEUTROABS 4.3  --   --   HGB 15.8* 17.7* 14.3  HCT 46.1* 52.0* 43.8  MCV 85.5  --  86.1  PLT 181  --  172   Cardiac Enzymes:  Recent Labs Lab 12/26/15 1126 12/26/15 1630 12/26/15 2303  TROPONINI <0.03 <0.03 <0.03   BNP: BNP (last 3 results) No results for input(s): BNP in the last 8760 hours.  ProBNP (last 3 results) No results for input(s): PROBNP in the last 8760 hours.  CBG:  Recent Labs Lab 12/25/15 0020 12/25/15 0650 12/25/15 1152  GLUCAP 123* 103* 105*    Discussed extensively with patient's mother and updated care and answered questions related to her stroke. As per patient's wishes, I did not discuss with patient's mother regarding her substance abuse.   Signed:  Vernell Leep, MD, FACP, FHM. [facility] Pager [phone]  If 7PM-7AM, please contact night-coverage [email or web address] Password [facility] 12/27/2015, 12:47 PM

## 2015-12-27 NOTE — Progress Notes (Signed)
Physical Medicine and Rehabilitation Consult Reason for Consult: Subcortical CVA Referring Physician: [facility]   HPI: Theresa Watkins is a 50 y.o. right handed female with history of hypothyroidism, tobacco and polysubstance abuse. Per chart review patient lives alone. 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 She presented 12/24/2015 with fall and right sided weakness. Urine drug screen positive for cocaine. CT of the head showed a 3-4 millimeter hyperdense focus in the left caudate head and anterior body that could represent possible small vessel caudate infarct with microhemorrhage. Echocardiogram with ejection fraction of 60% no wall motion abnormalities and a type1R atrial septal aneurysm felt to be possibly small PFO with left-to-right flow by Doppler. Carotid Dopplers in no ICA stenosis. Patient did not receive TPA. Neurology consulted placed on aspirin for CVA prophylaxis. Subcutaneous Lovenox for DVT prophylaxis. Therapy evaluations initiated noted fatigue lethargy limited ability to participate. M.D. has requested physical medicine rehabilitation consult.  Review of Systems  Constitutional: Negative for fever and chills.  Eyes: Negative for blurred vision and double vision.  Respiratory: Positive for cough. Negative for shortness of breath.  Cardiovascular: Negative for chest pain, palpitations and leg swelling.  Gastrointestinal: Positive for constipation. Negative for nausea and vomiting.  Genitourinary: Negative for dysuria and hematuria.  Musculoskeletal: Positive for myalgias.   Recent fall with right side weakness  Neurological: Positive for dizziness, sensory change, speech change, focal weakness and headaches. Negative for loss of consciousness.  All other systems reviewed and are negative.  Past Medical History  Diagnosis Date  . Headache(784.0)   . Seizures ([place])   . Thyroid cancer ([place]) [address]11  . Bronchitis   .  Urinary tract infection   . Ovarian cyst   . Fibroid   . Abnormal Pap smear   . Trichomonas    Past Surgical History  Procedure Laterality Date  . Abdominal hysterectomy    . Laparoscopic  1988    removal of ectopic preg, ruptured tube  . Therapeutic abortion    . Cesarean section    . Thyroidectomy  march 2011    cancer  . Goiter      removed   Family History  Problem Relation Age of Onset  . Bell's palsy Mother   . Cancer Father   . Stroke Father    Social History:  reports that she has been smoking Cigarettes. She has a 12.5 pack-year smoking history. She has never used smokeless tobacco. She reports that she drinks alcohol. She reports that she uses illicit drugs (Cocaine). Allergies:  Allergies  Allergen Reactions  . Iohexol     Code: HIVES, Desc: pts tongue began itching post injection and throat burning, Onset Date: [id number]   . Percocet [Oxycodone-Acetaminophen] Rash   Medications Prior to Admission  Medication Sig Dispense Refill  . aspirin 81 MG tablet Take 81 mg by mouth daily.    . Calcium Carbonate-Vitamin D (CALCIUM + D PO) Take 2 tablets by mouth 2 (two) times daily.     Marland Kitchen levothyroxine (SYNTHROID, LEVOTHROID) 125 MCG tablet Take 1 tablet (125 mcg total) by mouth daily before breakfast. 30 tablet 1    Home: Home Living Family/patient expects to be discharged to:: Unsure Living Arrangements: Alone Type of Home: Other(Comment) (townhome) Home Layout: Two level Alternate Level Stairs-Number of Steps: 20 Alternate Level Stairs-Rails: Left Bathroom Shower/Tub: Chiropodist: Standard Home Equipment: (mother has shower chair pt may can use)  Functional History: Prior Function Level of Independence: Independent  Functional Status:  Mobility: Bed Mobility Overal bed mobility: Needs Assistance Bed Mobility: Sit to Sidelying, Supine  to Sit Supine to sit: Mod assist Sit to sidelying: Modified independent (Device/Increase time) General bed mobility comments: assist with trunk to come to sitting Transfers Overall transfer level: Needs assistance Transfers: Sit to/from Stand Sit to Stand: Min assist General transfer comment: Min guard-Min assist given      ADL: ADL Overall ADL's : Needs assistance/impaired Upper Body Dressing : Moderate assistance, Sitting Lower Body Dressing: Sit to/from stand Toilet Transfer: Moderate assistance, Ambulation (hand held assist; sit to stand from bed) Functional mobility during ADLs: Moderate assistance (hand held assist) General ADL Comments: encouraged pt to try to use Rt hand functionally.  Cognition: Cognition Overall Cognitive Status: (unsure of baseline) Orientation Level: Oriented X4 Cognition Arousal/Alertness: Awake/alert Behavior During Therapy: WFL for tasks assessed/performed Overall Cognitive Status: (unsure of baseline) Area of Impairment: Orientation Orientation Level: Disoriented to, Place, Time  Blood pressure 134/79, pulse 67, temperature 98.1 F (36.7 C), temperature source Oral, resp. rate 18, height 5\' 5"  (1.651 m), weight 74.798 kg (164 lb 14.4 oz), SpO2 95 %. Physical Exam  Vitals reviewed. Constitutional: She appears well-developed and well-nourished.  HENT:  Head: Normocephalic and atraumatic.  Eyes: Conjunctivae and EOM are normal.  Neck: Normal range of motion. Neck supple.  Cardiovascular: Normal rate and regular rhythm.  Respiratory: Effort normal and breath sounds normal. No respiratory distress.  GI: Soft. Bowel sounds are normal. She exhibits no distension.  Musculoskeletal: She exhibits no edema or tenderness.  Neurological: She is alert. A cranial nerve deficit is present.  Patient is a bit lethargic but arousable.  She is able to provide her name, age and date of birth. Fair awareness of deficits. Sensation diminished to light  touch R UE Right facial weakness Motor: R UE: Shoulder abduction 3/5, elbow flexion extension 2/5, hand grip 0/5 RLE: Flexion, knee extension 4/5, ankle dorsi/plantar flexion 5/5 LUE/LLE: 5/5 proximal distal DTRs 3+: R UE/RLE  Skin: Skin is warm and dry.  Psychiatric: She has a normal mood and affect. Her behavior is normal.     Lab Results Last 24 Hours    Results for orders placed or performed during the hospital encounter of 12/24/15 (from the past 24 hour(s))  Glucose, capillary Status: Abnormal   Collection Time: 12/25/15 6:50 AM  Result Value Ref Range   Glucose-Capillary 103 (H) 65 - 99 mg/dL  Glucose, capillary Status: Abnormal   Collection Time: 12/25/15 11:52 AM  Result Value Ref Range   Glucose-Capillary 105 (H) 65 - 99 mg/dL  TSH Status: Abnormal   Collection Time: 12/26/15 4:08 AM  Result Value Ref Range   TSH 18.683 (H) 0.350 - 4.500 uIU/mL  Lipid panel Status: Abnormal   Collection Time: 12/26/15 4:08 AM  Result Value Ref Range   Cholesterol 228 (H) 0 - 200 mg/dL   Triglycerides 157 (H) <150 mg/dL   HDL 62 >40 mg/dL   Total CHOL/HDL Ratio 3.7 RATIO   VLDL 31 0 - 40 mg/dL   LDL Cholesterol 135 (H) 0 - 99 mg/dL      Imaging Results (Last 48 hours)    Ct Head Wo Contrast  12/25/2015 CLINICAL DATA: Stroke. Headache. Seizures. Right arm weakness. EXAM: CT HEAD WITHOUT CONTRAST TECHNIQUE: Contiguous axial images were obtained from the base of the skull through the vertex without intravenous contrast. COMPARISON: 12/24/2015. Multiple previous examinations including MRI 10/30/2010. FINDINGS: The brainstem and cerebellum again appear normal. Cerebral hemispheres  appear normal [place] by CT with the exception of new demonstration of a 3-4 mm hyperdense focus in the left caudate head/anterior body which could represent a tiny infarction with microhemorrhage. No evidence of cortical or large  vessel territory infarction. No mass lesion, hemorrhage, hydrocephalus or extra-axial collection. No skull or skullbase lesion. Sinuses are clear. IMPRESSION: Question 3-4 mm hyperdense focus in the left caudate head/ anterior body that could represent a small-vessel caudate infarction with microhemorrhage. This is not absolutely definite. No other focal finding by CT. Electronically Signed By: Nelson Chimes M.D. On: 12/25/2015 14:05   Ct Head Wo Contrast  12/24/2015 CLINICAL DATA: Right-sided weakness, multiple falls x 48 hours EXAM: CT HEAD WITHOUT CONTRAST TECHNIQUE: Contiguous axial images were obtained from the base of the skull through the vertex without intravenous contrast. COMPARISON: CT head dated 12/06/2014 FINDINGS: No evidence of parenchymal hemorrhage or extra-axial fluid collection. No mass lesion, mass effect, or midline shift. No CT evidence of acute infarction. Cerebral volume is within normal limits. No ventriculomegaly. The visualized paranasal sinuses are essentially clear. The mastoid air cells are unopacified. No evidence of calvarial fracture. IMPRESSION: Normal head CT. Electronically Signed By: Julian Hy M.D. On: 12/24/2015 20:00     Assessment/Plan: Diagnosis: Left caudate CVA Labs and images independently reviewed. Records reviewed and summated above. Stroke: Continue secondary stroke prophylaxis and Risk Factor Modification listed below:  Antiplatelet therapy:  Blood Pressure Management: Continue current medication with prn's with permisive HTN per primary team Statin Agent:  Tobacco abuse: Cont to counsel Right sided hemiparesis: fit for orthotics to prevent contractures (resting hand splint for day, wrist cock up splint at night, etc) Motor recovery: Fluoxetine  1. Does the need for close, 24 hr/day medical supervision in concert with the patient's rehab needs make it unreasonable for this patient to be served in a less intensive setting?  Potentially  2. Co-Morbidities requiring supervision/potential complications: hypothyroidism (ensure mood and energy do not limit functional progress), tobacco abuse (continue to counsel), EtOH abuse (CIWA, continue counsel), cocaine abuse (continue counsel)  3. Due to safety, disease management, medication administration and patient education, does the patient require 24 hr/day rehab nursing? Yes 4. Does the patient require coordinated care of a physician, rehab nurse, PT (1-2 hrs/day, 5 days/week), OT (1-2 hrs/day, 5 days/week) and SLP (1-2 hrs/day, 5 days/week) to address physical and functional deficits in the context of the above medical diagnosis(es)? Yes Addressing deficits in the following areas: balance, endurance, locomotion, strength, transferring, bathing, dressing, feeding, toileting, speech, swallowing and psychosocial support 5. Can the patient actively participate in an intensive therapy program of at least 3 hrs of therapy per day at least 5 days per week? Likely in the near future 6. The potential for patient to make measurable gains while on inpatient rehab is excellent 7. Anticipated functional outcomes upon discharge from inpatient rehab are TBD with PT, modified independent and supervision with OT, TBD with SLP. 8. Estimated rehab length of stay to reach the above functional goals is: 16-19 days. 9. Does the patient have adequate social supports and living environment to accommodate these discharge functional goals? Potentially 10. Anticipated D/C setting: Other 11. Anticipated post D/C treatments: TBD 12. Overall Rehab/Functional Prognosis: good  RECOMMENDATIONS: This patient's condition is appropriate for continued rehabilitative care in the following setting: Pt has been too lethargic to particpate with PT and SLP. In addition, pt does not appear to have support at discharge. Will await formal therapy consults, however, if pt does not have  adequate support at discharge,  would recommend SNF. Patient has agreed to participate in recommended program. Yes Note that insurance prior authorization may be required for reimbursement for recommended care.  Comment: Rehab Admissions Coordinator to follow up.  Delice Lesch, MD 12/26/2015       Revision History     Date/Time User Provider Type Action   12/26/2015 10:43 AM Ankit Lorie Phenix, MD Physician Sign   12/26/2015 8:11 AM Cathlyn Parsons, PA-C Physician Assistant Pend   View Details Report       Routing History     Date/Time From To Method   12/26/2015 10:43 AM Ankit Lorie Phenix, MD Ankit Lorie Phenix, MD In [facility]   12/26/2015 10:43 AM Ankit Lorie Phenix, MD No Pcp Per Patient In Basket

## 2015-12-27 NOTE — Clinical Social Work Note (Signed)
CSW received referral for SNF.  Case discussed with case manager and plan is to discharge to inpatient rehab.  CSW to sign off please re-consult if social work needs arise.  Jovi Zavadil R. Crist Kruszka, MSW, LCSWA [phone]  

## 2015-12-27 NOTE — H&P (View-Only) (Signed)
Physical Medicine and Rehabilitation Admission H&P    Chief Complaint  Patient presents with  . Facial Droop  . Extremity Weakness  . Aphasia  : HPI: Theresa Watkins is a 50 y.o. right handed female with history of hypothyroidism, tobacco and polysubstance abuse. Per chart review patient lives alone. 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 She presented 12/24/2015 with fall and right sided weakness. Urine drug screen positive for cocaine. CT of the head showed a 3-4 millimeter hyperdense focus in the left caudate head and anterior body that could represent possible small vessel caudate infarct with microhemorrhage. Echocardiogram with ejection fraction of 60% no wall motion abnormalities and a type1R atrial septal aneurysm felt to be possibly small PFO with left-to-right flow by Doppler. Carotid Dopplers in no ICA stenosis. Patient did not receive TPA. Neurology consulted placed on aspirin for CVA prophylaxis. Subcutaneous Lovenox for DVT prophylaxis. Therapy evaluations initiated noted fatigue lethargy limited ability to participate. M.D. has requested physical medicine rehabilitation consult. Patient was admitted for a comprehensive rehabilitation program  ROS Constitutional: +Fever and chills.  Eyes: Negative for blurred vision and double vision.  Respiratory: Positive for cough. Negative for shortness of breath.  Cardiovascular: Negative for chest pain, palpitations and leg swelling.  Gastrointestinal: Positive for constipation. Negative for nausea and vomiting.  Genitourinary: Negative for dysuria and hematuria.  Musculoskeletal: Positive for myalgias.   Recent fall with right side weakness  Neurological: Positive for dizziness, sensory change, speech change, focal weakness and headaches. Negative for loss of consciousness.  All other systems reviewed and are negative  Past Medical History  Diagnosis Date  . Headache(784.0)   .  Seizures ([place])   . Thyroid cancer ([place]) [address]  . Bronchitis   . Urinary tract infection   . Ovarian cyst   . Fibroid   . Abnormal Pap smear   . Trichomonas    Past Surgical History  Procedure Laterality Date  . Abdominal hysterectomy    . Laparoscopic  1988    removal  of ectopic preg, ruptured tube  . Therapeutic abortion    . Cesarean section    . Thyroidectomy  march 2011    cancer  . Goiter      removed   Family History  Problem Relation Age of Onset  . Bell's palsy Mother   . Cancer Father   . Stroke Father    Social History:  reports that she has been smoking Cigarettes.  She has a 12.5 pack-year smoking history. She has never used smokeless tobacco. She reports that she drinks alcohol. She reports that she uses illicit drugs (Cocaine). Allergies:  Allergies  Allergen Reactions  . Iohexol      Code: HIVES, Desc: pts tongue began itching post injection and throat burning, Onset Date: [id number]   . Percocet [Oxycodone-Acetaminophen] Rash   Medications Prior to Admission  Medication Sig Dispense Refill  . aspirin 81 MG tablet Take 81 mg by mouth daily.    . Calcium Carbonate-Vitamin D (CALCIUM + D PO) Take 2 tablets by mouth 2 (two) times daily.     Marland Kitchen levothyroxine (SYNTHROID, LEVOTHROID) 125 MCG tablet Take 1 tablet (125 mcg total) by mouth daily before breakfast. 30 tablet 1    Home: Home Living Family/patient expects to be discharged to:: Unsure Living Arrangements: Alone Type of Home: Apartment (townhouse) Home Layout: Two level Alternate Level Stairs-Number of Steps: 20 Alternate Level Stairs-Rails: Left Bathroom Shower/Tub: Tub/shower unit [employer]  Toilet: Standard Home Equipment: Other (comment) (can borrow shower chair)  Lives With: Alone   Functional History: Prior Function Level of Independence: Independent  Functional Status:  Mobility: Bed Mobility Overal bed mobility: Needs Assistance Bed Mobility: Supine to Sit, Sit to Supine Supine to  sit: Min guard, HOB elevated Sit to supine: Supervision Sit to sidelying: Modified independent (Device/Increase time) General bed mobility comments: very impulsive and moves quickly; not protecting RUE and nearly rolled over onto it Transfers Overall transfer level: Needs assistance Equipment used: 1 person hand held assist Transfers: Sit to/from Stand, Stand Pivot Transfers Sit to Stand: Min assist Stand pivot transfers: Min assist General transfer comment: RUE supported to protect shoulder Ambulation/Gait Ambulation/Gait assistance: Mod assist Ambulation Distance (Feet): 35 Feet (standing rest, 35) Assistive device: 1 person hand held assist (rail in hall to return) Gait Pattern/deviations: Step-through pattern, Step-to pattern, Decreased stride length, Trendelenburg General Gait Details: initially no knee buckling, however after 35 ft +Rt knee buckling and Rt hip trendelenburg with incr LOB and mod assit to return to room Gait velocity interpretation: Below normal speed for age/gender    ADL: ADL Overall ADL's : Needs assistance/impaired Upper Body Dressing : Moderate assistance, Sitting Lower Body Dressing: Sit to/from stand Toilet Transfer: Moderate assistance, Ambulation (hand held assist; sit to stand from bed) Functional mobility during ADLs: Moderate assistance (hand held assist) General ADL Comments: encouraged pt to try to use Rt hand functionally.  Cognition: Cognition Overall Cognitive Status: No family/caregiver present to determine baseline cognitive functioning Arousal/Alertness: Awake/alert Orientation Level: Oriented X4 Attention: Selective Selective Attention: Appears intact Cognition Arousal/Alertness: Awake/alert Behavior During Therapy: Impulsive Overall Cognitive Status: No family/caregiver present to determine baseline cognitive functioning Area of Impairment: Attention, Safety/judgement Orientation Level: Disoriented to, Place, Time Current Attention  Level: Sustained Safety/Judgement: Decreased awareness of safety General Comments: moves quickly without re; for balance and RLE deficits  Physical Exam: Blood pressure 137/73, pulse 79, temperature 98 F (36.7 C), temperature source Oral, resp. rate 18, height 5\' 5"  (1.651 m), weight 74.798 kg (164 lb 14.4 oz), SpO2 98 %. Physical Exam Constitutional: She appears well-developed and well-nourished.  HENT:  Head: Normocephalic and atraumatic.  Eyes: Conjunctivae and EOM are normal.  Neck: Normal range of motion. Neck supple.  Cardiovascular: Normal rate and regular rhythm.  Respiratory: Effort normal and breath sounds normal. No respiratory distress.  GI: Soft. Bowel sounds are normal. She exhibits no distension.  Musculoskeletal: She exhibits no edema or tenderness.  Neurological: She is alert. Cranial nerve deficit is present.  Patient is a bit lethargic but arousable.  She is able to provide her name, age and date of birth. Fair awareness of deficits. Sensation diminished to light touch RUE Right facial weakness Motor: R UE: Shoulder abduction 3/5, elbow flexion extension 2/5, hand grip 0/5 RLE: Flexion, knee extension 4/5, ankle dorsi/plantar flexion 5/5 LUE/LLE: 5/5 proximal distal DTRs 3+: RUE/RLE  Skin: Skin is warm and dry.  Psychiatric: She has a normal mood and affect. Her behavior is normal  Results for orders placed or performed during the hospital encounter of 12/24/15 (from the past 48 hour(s))  Glucose, capillary     Status: Abnormal   Collection Time: 12/25/15  6:50 AM  Result Value Ref Range   Glucose-Capillary 103 (H) 65 - 99 mg/dL  Glucose, capillary     Status: Abnormal   Collection Time: 12/25/15 11:52 AM  Result Value Ref Range   Glucose-Capillary 105 (H) 65 - 99 mg/dL  TSH  Status: Abnormal   Collection Time: 12/26/15  4:08 AM  Result Value Ref Range   TSH 18.683 (H) 0.350 - 4.500 uIU/mL  Hemoglobin A1c     Status: Abnormal   Collection Time:  12/26/15  4:08 AM  Result Value Ref Range   Hgb A1c MFr Bld 5.8 (H) 4.8 - 5.6 %    Comment: (NOTE)         Pre-diabetes: 5.7 - 6.4         Diabetes: >6.4         Glycemic control for adults with diabetes: <7.0    Mean Plasma Glucose 120 mg/dL    Comment: (NOTE) Performed At: [facility] [place], [place] [id number] Lindon Romp MD [id number]   Lipid panel     Status: Abnormal   Collection Time: 12/26/15  4:08 AM  Result Value Ref Range   Cholesterol 228 (H) 0 - 200 mg/dL   Triglycerides 157 (H) <150 mg/dL   HDL 62 >40 mg/dL   Total CHOL/HDL Ratio 3.7 RATIO   VLDL 31 0 - 40 mg/dL   LDL Cholesterol 135 (H) 0 - 99 mg/dL    Comment:        Total Cholesterol/HDL:CHD Risk Coronary Heart Disease Risk Table                     Men   Women  1/2 Average Risk   3.4   3.3  Average Risk       5.0   4.4  2 X Average Risk   9.6   7.1  3 X Average Risk  23.4   11.0        Use the calculated Patient Ratio above and the CHD Risk Table to determine the patient's CHD Risk.        ATP III CLASSIFICATION (LDL):  <100     mg/dL   Optimal  100-129  mg/dL   Near or Above                    Optimal  130-159  mg/dL   Borderline  160-189  mg/dL   High  >190     mg/dL   Very High   Troponin I (q 6hr x 3)     Status: None   Collection Time: 12/26/15 11:26 AM  Result Value Ref Range   Troponin I <0.03 <0.031 ng/mL    Comment:        NO INDICATION OF MYOCARDIAL INJURY.   Troponin I (q 6hr x 3)     Status: None   Collection Time: 12/26/15  4:30 PM  Result Value Ref Range   Troponin I <0.03 <0.031 ng/mL    Comment:        NO INDICATION OF MYOCARDIAL INJURY.   Troponin I (q 6hr x 3)     Status: None   Collection Time: 12/26/15 11:03 PM  Result Value Ref Range   Troponin I <0.03 <0.031 ng/mL    Comment:        NO INDICATION OF MYOCARDIAL INJURY.    Dg Chest 1 View  12/26/2015  CLINICAL DATA:  50 year old female with right upper chest pain since 1100  hours today. Initial encounter. EXAM: CHEST 1 VIEW COMPARISON:  10/30/2010 and earlier. FINDINGS: Portable AP semi upright view at 1119 hours. Lung volumes are stable and within normal limits. Normal cardiac size and mediastinal contours. Visualized tracheal air column is within normal  limits. No pneumothorax, pulmonary edema, pleural effusion or confluent pulmonary opacity. IMPRESSION: No acute cardiopulmonary abnormality. Electronically Signed   By: Genevie Ann M.D.   On: 12/26/2015 11:52   Ct Head Wo Contrast  12/25/2015  CLINICAL DATA:  Stroke.  Headache.  Seizures.  Right arm weakness. EXAM: CT HEAD WITHOUT CONTRAST TECHNIQUE: Contiguous axial images were obtained from the base of the skull through the vertex without intravenous contrast. COMPARISON:  12/24/2015. Multiple previous examinations including MRI 10/30/2010. FINDINGS: The brainstem and cerebellum again appear normal. Cerebral hemispheres appear normal MK by CT with the exception of new demonstration of a 3-4 mm hyperdense focus in the left caudate head/anterior body which could represent a tiny infarction with microhemorrhage. No evidence of cortical or large vessel territory infarction. No mass lesion, hemorrhage, hydrocephalus or extra-axial collection. No skull or skullbase lesion. Sinuses are clear. IMPRESSION: Question 3-4 mm hyperdense focus in the left caudate head/ anterior body that could represent a small-vessel caudate infarction with microhemorrhage. This is not absolutely definite. No other focal finding by CT. Electronically Signed   By: Nelson Chimes M.D.   On: 12/25/2015 14:05   Medical Problem List and Plan: 1.  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  DVT Prophylaxis/Anticoagulation: Subcutaneous Lovenox. Monitor platelet counts of any signs of bleeding 3. Pain Management: Tylenol as needed 4. Hypothyroidism. Synthroid 5. Neuropsych: This patient is capable of making decisions on her own behalf. 6. Skin/Wound Care:  Routine skin checks 7. Fluids/Electrolytes/Nutrition: Routine I&O with follow-up chemistries 8. Tobacco/cocaine abuse. Nicoderm patch. Provide counseling 9. Hyperlipidemia. Lipitor   Post Admission Physician Evaluation: 1. 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 2. Patient is admitted to receive collaborative, interdisciplinary care between the physiatrist, rehab nursing staff, and therapy team. 3. Patient's level of medical complexity and substantial therapy needs in context of that medical necessity cannot be provided at a lesser intensity of care such as a SNF. 4. Patient has experienced substantial functional loss from his/her baseline which was documented above under the "Functional History" and "Functional Status" headings.  Judging by the patient's diagnosis, physical exam, and functional history, the patient has potential for functional progress which will result in measurable gains while on inpatient rehab.  These gains will be of substantial and practical use upon discharge  in facilitating mobility and self-care at the household level. 5. Physiatrist will provide 24 hour management of medical needs as well as oversight of the therapy plan/treatment and provide guidance as appropriate regarding the interaction of the two. 6. 24 hour rehab nursing will assist with safety, disease management, medication administration and patient education and help integrate therapy concepts, techniques,education, etc. 7. PT will assess and treat for/with: Lower extremity strength, range of motion, stamina, balance, functional mobility, safety, adaptive techniques and equipment, coping skills, pain control, stroke education.   Goals are: Supervision/ Mod I. 8. OT will assess and treat for/with: ADL's, functional mobility, safety, upper extremity strength, adaptive techniques and equipment, ego support, and community reintegration.   Goals are: Mod I/Ind. Therapy may proceed with showering this  patient. 9. SLP will assess and treat for/with: speech and higher level cognition.  Goals are: Supervision/Mod I. 10. Case Management and Social Worker will assess and treat for psychological issues and discharge planning. 11. Team conference will be held weekly to assess progress toward goals and to determine barriers to discharge. 12. Patient will receive at least 3 hours of therapy per day at least 5 days per week. 13.  ELOS: 12-15 days.       14. Prognosis:  good  Delice Lesch, MD 12/27/2015

## 2015-12-27 NOTE — Progress Notes (Signed)
Occupational Therapy Treatment Patient Details Name: OLIVA COLUMBO MRN: [id number] DOB: 1966-02-10 Today's Date: 12/27/2015    History of present illness 50 y.o. female presented with new onset right facial and upper extremity weakness, as well as slurred speech. Pt was smoking crack when symptoms began. CT head + Lt caudate CVA  PMHx-CVA, hypertension, thyroid cancer, cocaine use, UTI, seizures, trichomonas, and headache.   OT comments  Pt making good progress toward OT goals and very agreeable to participate in therapy. Pt able to complete grooming activities in standing, UB/LB bathing and dressing with min-mod assist overall. Educated pt and family on incorporating RUE into functional activities; pt verbalized and demonstrated understanding. Will continue to follow acutely.   Follow Up Recommendations  CIR;Supervision/Assistance - 24 hour    Equipment Recommendations  Other (comment) (TBD at next venue)    Recommendations for Other Services      Precautions / Restrictions Precautions Precautions: Fall Precaution Comments: risk for Rt shoulder subluxation Restrictions Weight Bearing Restrictions: No       Mobility Bed Mobility Overal bed mobility: Needs Assistance Bed Mobility: Supine to Sit     Supine to sit: Min guard;HOB elevated     General bed mobility comments: Min guard for safety secondary to impulsivity.  Transfers Overall transfer level: Needs assistance Equipment used: 1 person hand held assist Transfers: Sit to/from Stand Sit to Stand: Min guard         General transfer comment: Min guard for sit to stand but requires assist once engaging in dynamic activities.    Balance Overall balance assessment: Needs assistance Sitting-balance support: No upper extremity supported;Feet supported Sitting balance-Leahy Scale: Fair     Standing balance support: No upper extremity supported;During functional activity Standing balance-Leahy Scale: Poor                     ADL Overall ADL's : Needs assistance/impaired     Grooming: Moderate assistance;Standing;Wash/dry face;Oral care;Applying deodorant;Sitting Grooming Details (indicate cue type and reason): Assist for applying deoderant to L underarm. Assist provided for balance in standing with oral care and washing face at sink. Upper Body Bathing: Minimal assitance;Sitting Upper Body Bathing Details (indicate cue type and reason): Hand over hand assist for washing LUE; pt able to control gross movements but difficulty holding washcloth with R hand. Lower Body Bathing: Minimal assistance;Sit to/from stand Lower Body Bathing Details (indicate cue type and reason): Assist with bottom and to provide support in standing. Pt able to complete washing legs/feet and peri area in sitting with min guard for safety. Upper Body Dressing : Minimal assistance;Sitting;Cueing for sequencing Upper Body Dressing Details (indicate cue type and reason): VC for starting R arm into shirt first. Min guard to doff shirt, min assist for donning. Lower Body Dressing: Moderate assistance;Sit to/from stand Lower Body Dressing Details (indicate cue type and reason): Pt able to start underwear and pants over feet; assist to pull up and for balance in standing. Pt able to don socks in sitting with min guard for safety, assist given for donning tennis shoes.             Functional mobility during ADLs: Moderate assistance (hand held asssit) General ADL Comments: Pt demonstrating impulsivity with transfers; discussed slowing down and taking time with transfers and functional activities. Family present at end of session, very supportive. Encoraged pt to use R UE with functional activities; pt demonstrating understanding.      Vision  Perception     Praxis      Cognition   Behavior During Therapy: Impulsive Overall Cognitive Status: Within Functional Limits for tasks assessed                        Extremity/Trunk Assessment               Exercises     Shoulder Instructions       General Comments      Pertinent Vitals/ Pain       Pain Assessment: No/denies pain  Home Living                                          Prior Functioning/Environment              Frequency Min 2X/week     Progress Toward Goals  OT Goals(current goals can now be found in the care plan section)  Progress towards OT goals: Progressing toward goals  Acute Rehab OT Goals Patient Stated Goal: rehab today OT Goal Formulation: With patient/family  Plan Discharge plan remains appropriate    Co-evaluation                 End of Session Equipment Utilized During Treatment: Gait belt   Activity Tolerance Patient tolerated treatment well   Patient Left in chair;with call bell/phone within reach;with chair alarm set;with family/visitor present   Nurse Communication          Time: [id number] OT Time Calculation (min): 40 min  Charges: OT General Charges $OT Visit: 1 Procedure OT Treatments $Self Care/Home Management : 38-52 mins  Binnie Kand M.S., OTR/L Pager: [phone]  12/27/2015, 4:03 PM

## 2015-12-27 NOTE — Interval H&P Note (Signed)
Theresa Watkins was admitted today to Inpatient Rehabilitation with the diagnosis of left caudate CVA.  The patient's history has been reviewed, patient examined, and there is no change in status.  Patient continues to be appropriate for intensive inpatient rehabilitation.  I have reviewed the patient's chart and labs.  Questions were answered to the patient's satisfaction. The PAPE has been reviewed and assessment remains appropriate.  Ankit Lorie Phenix 12/27/2015, 10:09 PM

## 2015-12-27 NOTE — Progress Notes (Signed)
Patient is discharged from room 5M06 and transferred to unit 4M02 at this time. Alert and in stable condition. IV site d/c'd as well as tele. Report given to receiving nurse Mariam RN. Transported via bed with all belongings and family at side.

## 2015-12-27 NOTE — Progress Notes (Signed)
I met with pt at bedside to discuss options for her rehab. I also spoke with her Mom by phone to clarify assistance she could provide at home. I contacted Adam with financial counseling to request Medicaid and Disability applications to be initiated. Discussed with Dr. Hulda Humphrey and Dr. Algis Liming and will plan to admit pt to inpt rehab today. I will notify RN CM and SW. [phone]

## 2015-12-27 NOTE — Discharge Instructions (Signed)
Stroke Prevention Some medical conditions and behaviors are associated with an increased chance of having a stroke. You may prevent a stroke by making healthy choices and managing medical conditions. HOW CAN I REDUCE MY RISK OF HAVING A STROKE?   Stay physically active. Get at least 30 minutes of activity on most or all days.  Do not smoke. It may also be helpful to avoid exposure to secondhand smoke.  Limit alcohol use. Moderate alcohol use is considered to be:  No more than 2 drinks per day for men.  No more than 1 drink per day for nonpregnant women.  Eat healthy foods. This involves:  Eating 5 or more servings of fruits and vegetables a day.  Making dietary changes that address high blood pressure (hypertension), high cholesterol, diabetes, or obesity.  Manage your cholesterol levels.  Making food choices that are high in fiber and low in saturated fat, trans fat, and cholesterol may control cholesterol levels.  Take any prescribed medicines to control cholesterol as directed by your health care provider.  Manage your diabetes.  Controlling your carbohydrate and sugar intake is recommended to manage diabetes.  Take any prescribed medicines to control diabetes as directed by your health care provider.  Control your hypertension.  Making food choices that are low in salt (sodium), saturated fat, trans fat, and cholesterol is recommended to manage hypertension.  Ask your health care provider if you need treatment to lower your blood pressure. Take any prescribed medicines to control hypertension as directed by your health care provider.  If you are 18-39 years of age, have your blood pressure checked every 3-5 years. If you are 40 years of age or older, have your blood pressure checked every year.  Maintain a healthy weight.  Reducing calorie intake and making food choices that are low in sodium, saturated fat, trans fat, and cholesterol are recommended to manage  weight.  Stop drug abuse.  Avoid taking birth control pills.  Talk to your health care provider about the risks of taking birth control pills if you are over 35 years old, smoke, get migraines, or have ever had a blood clot.  Get evaluated for sleep disorders (sleep apnea).  Talk to your health care provider about getting a sleep evaluation if you snore a lot or have excessive sleepiness.  Take medicines only as directed by your health care provider.  For some people, aspirin or blood thinners (anticoagulants) are helpful in reducing the risk of forming abnormal blood clots that can lead to stroke. If you have the irregular heart rhythm of atrial fibrillation, you should be on a blood thinner unless there is a good reason you cannot take them.  Understand all your medicine instructions.  Make sure that other conditions (such as anemia or atherosclerosis) are addressed. SEEK IMMEDIATE MEDICAL CARE IF:   You have sudden weakness or numbness of the face, arm, or leg, especially on one side of the body.  Your face or eyelid droops to one side.  You have sudden confusion.  You have trouble speaking (aphasia) or understanding.  You have sudden trouble seeing in one or both eyes.  You have sudden trouble walking.  You have dizziness.  You have a loss of balance or coordination.  You have a sudden, severe headache with no known cause.  You have new chest pain or an irregular heartbeat. Any of these symptoms may represent a serious problem that is an emergency. Do not wait to see if the symptoms will   go away. Get medical help at once. Call your local emergency services (911 in [place] Do not drive yourself to the hospital.   This information is not intended to replace advice given to you by your health care provider. Make sure you discuss any questions you have with your health care provider.   Document Released: 12/12/2004 Document Revised: 11/25/2014 Document Reviewed:  05/07/2013 Elsevier Interactive Patient Education 2016 [employer]  

## 2015-12-27 NOTE — Progress Notes (Signed)
Speech Language Pathology Treatment: Cognitive-Linquistic  Patient Details Name: Theresa Watkins MRN: [id number] DOB: 1966/06/14 Today's Date: 12/27/2015 Time: [id number] SLP Time Calculation (min) (ACUTE ONLY): 15 min  Assessment / Plan / Recommendation Clinical Impression  Pt was seen for skilled ST targeting communication treatment and ongoing diagnostic treatment of cognitive function.  Pt sleeping upon arrival but was easily awakened to voice and light touch.  Pt's speech very difficult to understand upon waking due to fast rate, low volume, and imprecise articulation of consonants.  Pt required mod-max assist verbal cues for slow rate to achieve ~75% intelligibility in sentences.  Pt scored a 15 out of 22 on the MoCA-Blind (n>/=18; Blind version administered due to baseline visual deficits and pt currently without reading glasses).  Impairments were most notable in delayed recall of information and abstract reasoning.  Pt endorses noticing memory deficits s/p CVA which she did not have previously, although question accuracy of pt's statement given that she at times provides conflicting information.  Pt also noted to be impulsive and tangential during conversations with SLP, requiring min-mod verbal cues for topic maintenance. Recommend adding cognitive goals to pt's plan of care.     HPI HPI: Theresa Watkins is a 50 y.o. female  who presents with right sided weakness and facial droop.  Stroke code was not called because the patient had presented outside the TPA window. CT of the head without contrast was unremarkable. The patient was discussed with neurology who recommended stroke workup and transfer to [facility]. The patient reports a "mini stroke" years ago. She smokes socially. She takes a baby aspirin frequently. She does not have a regular PCP.      SLP Plan  Continue with current plan of care     Recommendations                Follow up Recommendations: Inpatient Rehab;Outpatient  SLP Plan: Continue with current plan of care     GO                PageSelinda Watkins 12/27/2015, 9:54 AM

## 2015-12-27 NOTE — PMR Pre-admission (Signed)
PMR Admission Coordinator Pre-Admission Assessment  Patient: Theresa Watkins is an 50 y.o., female MRN: [id number] DOB: 1966-10-13 Height: 5\' 5"  (165.1 cm) Weight: 74.798 kg (164 lb 14.4 oz)              Insurance Information HMO:     PPO:      PCP:      IPA:      80/20:      OTHER:  PRIMARY: UNinsured        Medicaid Application Date:       Case Manager:  Disability Application Date:       Case Worker:  I contacted Adam with Development worker, community to request disability and Medicaid Applications to be initiated on 12/27/15  Emergency Contact Information Contact Information    Name Relation Home Work [place] Mother [phone]  [phone]     Current Medical History  Patient Admitting Diagnosis: Left CVA HIstory of Present Illness: Theresa Watkins is a 50 y.o. right handed female with history of hypothyroidism, tobacco and polysubstance abuse. Per chart review patient lives alone. 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 She presented 12/24/2015 with fall and right sided weakness. Urine drug screen positive for cocaine. CT of the head showed a 3-4 millimeter hyperdense focus in the left caudate head and anterior body that could represent possible small vessel caudate infarct with microhemorrhage. Echocardiogram with ejection fraction of 60% no wall motion abnormalities and a type1R atrial septal aneurysm felt to be possibly small PFO with left-to-right flow by Doppler. Carotid Dopplers in no ICA stenosis. Patient did not receive TPA. Neurology consulted placed on aspirin for CVA prophylaxis. Subcutaneous Lovenox for DVT prophylaxis.   Total: 7 NIH    Past Medical History  Past Medical History  Diagnosis Date  . Headache(784.0)   . Seizures ([place])   . Thyroid cancer ([place]) 20011  . Bronchitis   . Urinary tract infection   . Ovarian cyst   . Fibroid   . Abnormal Pap smear   . Trichomonas     Family History  family history  includes Bell's palsy in her mother; Cancer in her father; Stroke in her father.  Prior Rehab/Hospitalizations:  Has the patient had major surgery during 100 days prior to admission? No  Current Medications   Current facility-administered medications:  .  acetaminophen (TYLENOL) tablet 650 mg, 650 mg, Oral, Q4H PRN, 650 mg at 12/26/15 2258 **OR** acetaminophen (TYLENOL) suppository 650 mg, 650 mg, Rectal, Q4H PRN, Edwin Dada, MD .  alum & mag hydroxide-simeth (MAALOX/MYLANTA) 200-200-20 MG/5ML suspension 30 mL, 30 mL, Oral, Q6H PRN, Kelvin Cellar, MD, 30 mL at 12/26/15 1214 .  aspirin EC tablet 325 mg, 325 mg, Oral, Daily, Edwin Dada, MD, 325 mg at 12/27/15 1051 .  atorvastatin (LIPITOR) tablet 40 mg, 40 mg, Oral, q1800, Garvin Fila, MD, 40 mg at 12/26/15 1847 .  enoxaparin (LOVENOX) injection 40 mg, 40 mg, Subcutaneous, QHS, Edwin Dada, MD, 40 mg at 12/26/15 2159 .  levothyroxine (SYNTHROID, LEVOTHROID) tablet 125 mcg, 125 mcg, Oral, QAC breakfast, Edwin Dada, MD, 125 mcg at 12/27/15 [phone] .  morphine 2 MG/ML injection 2 mg, 2 mg, Intravenous, Q4H PRN, Kelvin Cellar, MD .  nicotine (NICODERM CQ - dosed in mg/24 hr) patch 7 mg, 7 mg, Transdermal, Daily, Edwin Dada, MD, 7 mg at 12/27/15 1052 .  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 None of the above  Prior Functional Level Prior Function Level of Independence: Independent Comments: independent and driving pta  Self Care: Did the patient need help bathing, dressing, using the toilet or eating?  Independent  Indoor Mobility: Did the patient need assistance with walking from room to room (with or without device)? Independent  Stairs: Did the patient need assistance with internal or external stairs (with or without device)? Independent  Functional Cognition: Did the patient need help planning regular tasks such as shopping or remembering to take medications? Independent  Current Functional Level Cognition  Arousal/Alertness: Awake/alert Overall Cognitive Status: Within Functional Limits for tasks assessed Current Attention Level: Sustained Orientation Level: Oriented X4 Safety/Judgement: Decreased awareness of safety General Comments: moves quickly without re; for balance and RLE deficits Attention: Selective Selective Attention: Appears intact    Extremity Assessment (includes Sensation/Coordination)  Upper Extremity Assessment: Defer to OT evaluation RUE Deficits / Details: little shoulder movement-weakness; minimal finger movement; weak grip strength RUE Sensation: decreased light touch RUE Coordination: decreased fine motor, decreased gross motor  Lower Extremity Assessment: RLE deficits/detail RLE Deficits / Details: AROM WFL; hip abdct 3+ (sidelying), flexion 4/5; knee extension 4/5, DF 5/5 RLE Coordination: decreased gross motor    ADLs  Overall ADL's : Needs assistance/impaired Upper Body Dressing : Moderate assistance, Sitting Lower Body Dressing: Sit to/from stand Toilet Transfer: Moderate assistance, Ambulation (hand held assist; sit to stand from bed) Functional mobility during ADLs: Moderate assistance (hand held assist) General ADL Comments:  encouraged pt to try to use Rt hand functionally.    Mobility  Overal bed mobility: Needs Assistance Bed Mobility: Supine to Sit, Sit to Supine Supine to sit: Min guard, HOB elevated Sit to supine: Supervision Sit to sidelying: Modified independent (Device/Increase time) General bed mobility comments: very impulsive and moves quickly; not protecting RUE and nearly rolled over onto it    Transfers  Overall transfer level: Needs assistance Equipment used: 1 person hand held assist Transfers: Sit to/from Stand, Stand Pivot Transfers Sit to Stand: Min assist Stand pivot transfers: Min assist General transfer comment: RUE supported to protect shoulder    Ambulation / Gait / Stairs / Wheelchair Mobility  Ambulation/Gait Ambulation/Gait assistance: Mod assist Ambulation Distance (Feet): 35 Feet (standing rest, 35) Assistive device: 1 person hand held assist (rail in hall to return) Gait Pattern/deviations: Step-through pattern, Step-to pattern, Decreased stride length, Trendelenburg General Gait Details: initially no knee buckling, however after 35 ft +Rt knee buckling and Rt hip trendelenburg with incr LOB and mod assit to return to room Gait velocity interpretation: Below normal speed for age/gender    Posture / Balance Dynamic Sitting Balance Sitting balance - Comments: dyscoordinated movements through torso/pelvis when shifting/scooting Balance Overall balance assessment: Needs assistance Sitting-balance support: No upper extremity supported, Feet unsupported Sitting balance-Leahy Scale: Good Sitting balance - Comments: dyscoordinated movements through torso/pelvis when shifting/scooting Standing balance support: Single extremity supported  Standing balance-Leahy Scale: Poor    Special needs/care consideration Skin intact Bowel mgmt: continent Bladder mgmt: continent Pt needs to have PCP arranged. Frequent use of ED for medications/Synthroid   Previous Home Environment Living  Arrangements: Alone  Lives With: Alone Available Help at Discharge: Other (Comment) (Mom can come daily to assist as needed like when bathing and) Type of Home: House (townhome rental) Home Layout: Bed/bath upstairs, Two level Alternate Level Stairs-Rails: Right Alternate Level Stairs-Number of Steps: 20 Home Access: Stairs to enter Entrance Stairs-Rails: None Entrance Stairs-Number of Steps: 3 steps Bathroom Shower/Tub: Tub/shower unit, Architectural technologist: Standard Bathroom Accessibility: Yes How Accessible: Accessible via walker The Silos: No Additional Comments: pt has cats at home  Discharge Living Setting Plans for Discharge Living Setting: Patient's home, Alone Type of Home at Discharge: Other (Comment) (rental two story townhome) Discharge Home Layout: Two level, Bed/bath upstairs Alternate Level Stairs-Rails: Right Alternate Level Stairs-Number of Steps: 20 Discharge Home Access: Stairs to enter Entrance Stairs-Rails: None Entrance Stairs-Number of Steps: 3 Discharge Bathroom Shower/Tub: Tub/shower unit, Curtain Discharge Bathroom Toilet: Standard Discharge Bathroom Accessibility: Yes How Accessible: Accessible via walker Does the patient have any problems obtaining your medications?: Yes (Describe) (no insurance and no PCP)  Social/Family/Support Systems Patient Roles: Parent (92 yo son in [place]) Contact Information: Konrad Felix, Mom Anticipated Caregiver: Mom can provide assist daily visits, not 24/7 Anticipated Caregiver's Contact Information: Steffanie Rainwater, cell [phone] Ability/Limitations of Caregiver: Mom lives close by but pt does not want to be a burden to her by staying with her or Mom staying with pt. One bedroom townhome  is pt's home Caregiver Availability: Intermittent Discharge Plan Discussed with Primary Caregiver: Yes Is Caregiver In Agreement with Plan?: Yes Does Caregiver/Family have Issues with Lodging/Transportation while Pt  is in Rehab?: No  Goals/Additional Needs Patient/Family Goal for Rehab: MOd I with PT, Mod I to supervision with OT, Mod I with SLP Expected length of stay: [facility] 10-14 days  Decrease burden of Care through IP rehab admission: n/a  Possible need for SNF placement upon discharge:not anticipated but I did discuss with pt that if SNF needed, it would likely be outside of [facility]. Discussed on 12/27/2015 at bedside with pt.  Patient Condition: This patient's condition remains as documented in the consult dated 12/26/2015, in which the Rehabilitation Physician determined and documented that the patient's condition is appropriate for intensive rehabilitative care in an inpatient rehabilitation facility. Will admit to inpatient rehab today.  Preadmission Screen Completed By:  Cleatrice Burke, 12/27/2015 12:07 PM ______________________________________________________________________   Discussed status with Dr. Posey Pronto on 12/27/2015 at  1207 and received telephone approval for admission today.  Admission Coordinator:  Cleatrice Burke, time 1207 Date 12/27/2015.

## 2015-12-27 NOTE — Progress Notes (Signed)
PMR Admission Coordinator Pre-Admission Assessment  Patient: Theresa Watkins is an 50 y.o., female MRN: [id number] DOB: 1966/10/09 Height: 5\' 5"  (165.1 cm) Weight: 74.798 kg (164 lb 14.4 oz)  Insurance Information HMO: PPO: PCP: IPA: 80/20: OTHER:  PRIMARY: UNinsured   Medicaid Application Date: Case Manager:  Disability Application Date: Case Worker:  I contacted Adam with Development worker, community to request disability and Medicaid Applications to be initiated on 12/27/15  Emergency Contact Information Contact Information    Name Relation Home Work Hodges Mother [phone]  [phone]     Current Medical History  Patient Admitting Diagnosis: Left CVA HIstory of Present Illness:Theresa Watkins is a 50 y.o. right handed female with history of hypothyroidism, tobacco and polysubstance abuse. Per chart review patient lives alone. 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 She presented 12/24/2015 with fall and right sided weakness. Urine drug screen positive for cocaine. CT of the head showed a 3-4 millimeter hyperdense focus in the left caudate head and anterior body that could represent possible small vessel caudate infarct with microhemorrhage. Echocardiogram with ejection fraction of 60% no wall motion abnormalities and a type1R atrial septal aneurysm felt to be possibly small PFO with left-to-right flow by Doppler. Carotid Dopplers in no ICA stenosis. Patient did not receive TPA. Neurology consulted placed on aspirin for CVA prophylaxis. Subcutaneous Lovenox for DVT prophylaxis.   Total: 7 NIH    Past Medical History  Past Medical History  Diagnosis Date  . Headache(784.0)   . Seizures ([place])   . Thyroid cancer ([place])  20011  . Bronchitis   . Urinary tract infection   . Ovarian cyst   . Fibroid   . Abnormal Pap smear   . Trichomonas     Family History  family history includes Bell's palsy in her mother; Cancer in her father; Stroke in her father.  Prior Rehab/Hospitalizations:  Has the patient had major surgery during 100 days prior to admission? No  Current Medications   Current facility-administered medications:  . acetaminophen (TYLENOL) tablet 650 mg, 650 mg, Oral, Q4H PRN, 650 mg at 12/26/15 2258 **OR** acetaminophen (TYLENOL) suppository 650 mg, 650 mg, Rectal, Q4H PRN, Edwin Dada, MD . alum & mag hydroxide-simeth (MAALOX/MYLANTA) 200-200-20 MG/5ML suspension 30 mL, 30 mL, Oral, Q6H PRN, Kelvin Cellar, MD, 30 mL at 12/26/15 1214 . aspirin EC tablet 325 mg, 325 mg, Oral, Daily, Edwin Dada, MD, 325 mg at 12/27/15 1051 . atorvastatin (LIPITOR) tablet 40 mg, 40 mg, Oral, q1800, Garvin Fila, MD, 40 mg at 12/26/15 1847 . enoxaparin (LOVENOX) injection 40 mg, 40 mg, Subcutaneous, QHS, Edwin Dada, MD, 40 mg at 12/26/15 2159 . levothyroxine (SYNTHROID, LEVOTHROID) tablet 125 mcg, 125 mcg, Oral, QAC breakfast, Edwin Dada, MD, 125 mcg at 12/27/15 [id number] . morphine 2 MG/ML injection 2 mg, 2 mg, Intravenous, Q4H PRN, Kelvin Cellar, MD . nicotine (NICODERM CQ - dosed in mg/24 hr) patch 7 mg, 7 mg, Transdermal, Daily, Edwin Dada, MD, 7 mg at 12/27/15 1052 . 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 None of the above  Prior Functional Level Prior Function Level of Independence: Independent Comments: independent and driving pta  Self Care: Did the patient need help bathing, dressing, using the toilet or eating? Independent  Indoor Mobility: Did the patient need assistance with walking from room to room (with or without device)? Independent  Stairs: Did the patient need assistance with internal or external stairs (with or without device)? Independent  Functional Cognition: Did the patient need help planning regular tasks such as shopping or remembering to take medications? Independent  Current Functional Level Cognition  Arousal/Alertness: Awake/alert Overall Cognitive Status: Within Functional Limits for tasks assessed Current Attention Level: Sustained Orientation Level: Oriented X4 Safety/Judgement: Decreased awareness of safety General Comments: moves quickly without re; for balance and RLE deficits Attention: Selective Selective Attention: Appears intact   Extremity Assessment (includes Sensation/Coordination)  Upper Extremity Assessment: Defer to OT evaluation RUE Deficits / Details: little shoulder movement-weakness; minimal finger movement; weak grip strength RUE Sensation: decreased light touch RUE Coordination: decreased fine motor, decreased gross motor  Lower Extremity Assessment: RLE deficits/detail RLE Deficits / Details: AROM WFL; hip abdct 3+ (sidelying), flexion 4/5; knee extension 4/5, DF 5/5 RLE Coordination: decreased gross motor    ADLs  Overall ADL's : Needs assistance/impaired Upper Body Dressing : Moderate assistance, Sitting Lower Body Dressing: Sit to/from  stand Toilet Transfer: Moderate assistance, Ambulation (hand held assist; sit to stand from bed) Functional mobility during ADLs: Moderate assistance (hand held assist) General ADL Comments: encouraged pt to try to use Rt hand functionally.    Mobility  Overal bed mobility: Needs Assistance Bed Mobility: Supine to Sit, Sit to Supine Supine to sit: Min guard, HOB elevated Sit to supine: Supervision Sit to sidelying: Modified independent (Device/Increase time) General bed mobility comments: very impulsive and moves quickly; not protecting RUE and nearly rolled over onto it    Transfers  Overall transfer level: Needs assistance Equipment used: 1 person hand held assist Transfers: Sit to/from Stand, Stand Pivot Transfers Sit to Stand: Min assist Stand pivot transfers: Min assist General transfer comment: RUE supported to protect shoulder    Ambulation / Gait / Stairs / Wheelchair Mobility  Ambulation/Gait Ambulation/Gait assistance: Mod assist Ambulation Distance (Feet): 35 Feet (standing rest, 35) Assistive device: 1 person hand held assist (rail in hall to return) Gait Pattern/deviations: Step-through pattern, Step-to pattern, Decreased stride length, Trendelenburg General Gait Details: initially no knee buckling, however after 35 ft +Rt knee buckling and Rt hip trendelenburg with incr LOB and mod assit to return to room Gait velocity interpretation: Below normal speed for age/gender    Posture / Balance Dynamic Sitting Balance Sitting balance - Comments: dyscoordinated movements through torso/pelvis when shifting/scooting Balance Overall balance assessment: Needs assistance Sitting-balance support: No upper extremity supported, Feet unsupported Sitting balance-Leahy Scale: Good Sitting balance - Comments: dyscoordinated movements through torso/pelvis when shifting/scooting Standing balance support: Single extremity supported Standing balance-Leahy Scale: Poor     Special needs/care consideration Skin intact Bowel mgmt: continent Bladder mgmt: continent Pt needs to have PCP arranged. Frequent use of ED for medications/Synthroid   Previous Home Environment Living Arrangements: Alone Lives With: Alone Available Help at Discharge: Other (Comment) (Mom can come daily to assist as needed like when bathing and) Type of Home: House (townhome  rental) Home Layout: Bed/bath upstairs, Two level Alternate Level Stairs-Rails: Right Alternate Level Stairs-Number of Steps: 20 Home Access: Stairs to enter Entrance Stairs-Rails: None Entrance Stairs-Number of Steps: 3 steps Bathroom Shower/Tub: Tub/shower unit, Architectural technologist: Standard Bathroom Accessibility: Yes How Accessible: Accessible via walker Home Care Services: No Additional Comments: pt has cats at home  Discharge Living Setting Plans for Discharge Living Setting: Patient's home, Alone Type of Home at Discharge: Other (Comment) (rental two story townhome) Discharge Home Layout: Two level, Bed/bath upstairs Alternate Level Stairs-Rails: Right Alternate Level Stairs-Number of Steps: 20 Discharge Home Access: Stairs to enter Entrance Stairs-Rails: None Entrance Stairs-Number of Steps: 3 Discharge Bathroom Shower/Tub: Tub/shower unit, Curtain Discharge Bathroom Toilet: Standard Discharge Bathroom Accessibility: Yes How Accessible: Accessible via walker Does the patient have any problems obtaining your medications?: Yes (Describe) (no insurance and no PCP)  Social/Family/Support Systems Patient Roles: Parent (47 yo son in [place]) Contact Information: Konrad Felix, Mom Anticipated Caregiver: Mom can provide assist daily visits, not 24/7 Anticipated Caregiver's Contact Information: Steffanie Rainwater, cell [phone] Ability/Limitations of Caregiver: Mom lives close by but pt does not want to be a burden to her by staying with her or Mom staying with pt. One bedroom townhome is pt's  home Caregiver Availability: Intermittent Discharge Plan Discussed with Primary Caregiver: Yes Is Caregiver In Agreement with Plan?: Yes Does Caregiver/Family have Issues with Lodging/Transportation while Pt is in Rehab?: No  Goals/Additional Needs Patient/Family Goal for Rehab: MOd I with PT, Mod I to supervision with OT, Mod I with SLP Expected length of stay: [facility] 10-14 days  Decrease burden of Care through IP rehab admission: n/a  Possible need for SNF placement upon discharge:not anticipated but I did discuss with pt that if SNF needed, it would likely be outside of [facility]. Discussed on 12/27/2015 at bedside with pt.  Patient Condition: This patient's condition remains as documented in the consult dated 12/26/2015, in which the Rehabilitation Physician determined and documented that the patient's condition is appropriate for intensive rehabilitative care in an inpatient rehabilitation facility. Will admit to inpatient rehab today.  Preadmission Screen Completed By: Cleatrice Burke, 12/27/2015 12:07 PM ______________________________________________________________________  Discussed status with Dr. Posey Pronto on 12/27/2015 at 1207 and received telephone approval for admission today.  Admission Coordinator: Cleatrice Burke, time 1207 Date 12/27/2015.          Cosigned by: Ankit Lorie Phenix, MD at 12/27/2015 12:09 PM  Revision History     Date/Time User Provider Type Action   12/27/2015 12:09 PM Ankit Lorie Phenix, MD Physician Cosign   12/27/2015 12:07 PM Cristina Gong, RN Rehab Admission Coordinator Sign

## 2015-12-27 NOTE — H&P (Signed)
Physical Medicine and Rehabilitation Admission H&P    Chief Complaint  Patient presents with  . Facial Droop  . Extremity Weakness  . Aphasia  : HPI: Theresa Watkins is a 50 y.o. right handed female with history of hypothyroidism, tobacco and polysubstance abuse. Per chart review patient lives alone. 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 She presented 12/24/2015 with fall and right sided weakness. Urine drug screen positive for cocaine. CT of the head showed a 3-4 millimeter hyperdense focus in the left caudate head and anterior body that could represent possible small vessel caudate infarct with microhemorrhage. Echocardiogram with ejection fraction of 60% no wall motion abnormalities and a type1R atrial septal aneurysm felt to be possibly small PFO with left-to-right flow by Doppler. Carotid Dopplers in no ICA stenosis. Patient did not receive TPA. Neurology consulted placed on aspirin for CVA prophylaxis. Subcutaneous Lovenox for DVT prophylaxis. Therapy evaluations initiated noted fatigue lethargy limited ability to participate. M.D. has requested physical medicine rehabilitation consult. Patient was admitted for a comprehensive rehabilitation program  ROS Constitutional: +Fever and chills.  Eyes: Negative for blurred vision and double vision.  Respiratory: Positive for cough. Negative for shortness of breath.  Cardiovascular: Negative for chest pain, palpitations and leg swelling.  Gastrointestinal: Positive for constipation. Negative for nausea and vomiting.  Genitourinary: Negative for dysuria and hematuria.  Musculoskeletal: Positive for myalgias.   Recent fall with right side weakness  Neurological: Positive for dizziness, sensory change, speech change, focal weakness and headaches. Negative for loss of consciousness.  All other systems reviewed and are negative  Past Medical History  Diagnosis Date  . Headache(784.0)   .  Seizures ([place])   . Thyroid cancer ([place]) [address]  . Bronchitis   . Urinary tract infection   . Ovarian cyst   . Fibroid   . Abnormal Pap smear   . Trichomonas    Past Surgical History  Procedure Laterality Date  . Abdominal hysterectomy    . Laparoscopic  1988    removal  of ectopic preg, ruptured tube  . Therapeutic abortion    . Cesarean section    . Thyroidectomy  march 2011    cancer  . Goiter      removed   Family History  Problem Relation Age of Onset  . Bell's palsy Mother   . Cancer Father   . Stroke Father    Social History:  reports that she has been smoking Cigarettes.  She has a 12.5 pack-year smoking history. She has never used smokeless tobacco. She reports that she drinks alcohol. She reports that she uses illicit drugs (Cocaine). Allergies:  Allergies  Allergen Reactions  . Iohexol      Code: HIVES, Desc: pts tongue began itching post injection and throat burning, Onset Date: [id number]   . Percocet [Oxycodone-Acetaminophen] Rash   Medications Prior to Admission  Medication Sig Dispense Refill  . aspirin 81 MG tablet Take 81 mg by mouth daily.    . Calcium Carbonate-Vitamin D (CALCIUM + D PO) Take 2 tablets by mouth 2 (two) times daily.     Marland Kitchen levothyroxine (SYNTHROID, LEVOTHROID) 125 MCG tablet Take 1 tablet (125 mcg total) by mouth daily before breakfast. 30 tablet 1    Home: Home Living Family/patient expects to be discharged to:: Unsure Living Arrangements: Alone Type of Home: Apartment (townhouse) Home Layout: Two level Alternate Level Stairs-Number of Steps: 20 Alternate Level Stairs-Rails: Left Bathroom Shower/Tub: Tub/shower unit [employer]  Toilet: Standard Home Equipment: Other (comment) (can borrow shower chair)  Lives With: Alone   Functional History: Prior Function Level of Independence: Independent  Functional Status:  Mobility: Bed Mobility Overal bed mobility: Needs Assistance Bed Mobility: Supine to Sit, Sit to Supine Supine to  sit: Min guard, HOB elevated Sit to supine: Supervision Sit to sidelying: Modified independent (Device/Increase time) General bed mobility comments: very impulsive and moves quickly; not protecting RUE and nearly rolled over onto it Transfers Overall transfer level: Needs assistance Equipment used: 1 person hand held assist Transfers: Sit to/from Stand, Stand Pivot Transfers Sit to Stand: Min assist Stand pivot transfers: Min assist General transfer comment: RUE supported to protect shoulder Ambulation/Gait Ambulation/Gait assistance: Mod assist Ambulation Distance (Feet): 35 Feet (standing rest, 35) Assistive device: 1 person hand held assist (rail in hall to return) Gait Pattern/deviations: Step-through pattern, Step-to pattern, Decreased stride length, Trendelenburg General Gait Details: initially no knee buckling, however after 35 ft +Rt knee buckling and Rt hip trendelenburg with incr LOB and mod assit to return to room Gait velocity interpretation: Below normal speed for age/gender    ADL: ADL Overall ADL's : Needs assistance/impaired Upper Body Dressing : Moderate assistance, Sitting Lower Body Dressing: Sit to/from stand Toilet Transfer: Moderate assistance, Ambulation (hand held assist; sit to stand from bed) Functional mobility during ADLs: Moderate assistance (hand held assist) General ADL Comments: encouraged pt to try to use Rt hand functionally.  Cognition: Cognition Overall Cognitive Status: No family/caregiver present to determine baseline cognitive functioning Arousal/Alertness: Awake/alert Orientation Level: Oriented X4 Attention: Selective Selective Attention: Appears intact Cognition Arousal/Alertness: Awake/alert Behavior During Therapy: Impulsive Overall Cognitive Status: No family/caregiver present to determine baseline cognitive functioning Area of Impairment: Attention, Safety/judgement Orientation Level: Disoriented to, Place, Time Current Attention  Level: Sustained Safety/Judgement: Decreased awareness of safety General Comments: moves quickly without re; for balance and RLE deficits  Physical Exam: Blood pressure 137/73, pulse 79, temperature 98 F (36.7 C), temperature source Oral, resp. rate 18, height 5\' 5"  (1.651 m), weight 74.798 kg (164 lb 14.4 oz), SpO2 98 %. Physical Exam Constitutional: She appears well-developed and well-nourished.  HENT:  Head: Normocephalic and atraumatic.  Eyes: Conjunctivae and EOM are normal.  Neck: Normal range of motion. Neck supple.  Cardiovascular: Normal rate and regular rhythm.  Respiratory: Effort normal and breath sounds normal. No respiratory distress.  GI: Soft. Bowel sounds are normal. She exhibits no distension.  Musculoskeletal: She exhibits no edema or tenderness.  Neurological: She is alert. Cranial nerve deficit is present.  Patient is a bit lethargic but arousable.  She is able to provide her name, age and date of birth. Fair awareness of deficits. Sensation diminished to light touch RUE Right facial weakness Motor: R UE: Shoulder abduction 3/5, elbow flexion extension 2/5, hand grip 0/5 RLE: Flexion, knee extension 4/5, ankle dorsi/plantar flexion 5/5 LUE/LLE: 5/5 proximal distal DTRs 3+: RUE/RLE  Skin: Skin is warm and dry.  Psychiatric: She has a normal mood and affect. Her behavior is normal  Results for orders placed or performed during the hospital encounter of 12/24/15 (from the past 48 hour(s))  Glucose, capillary     Status: Abnormal   Collection Time: 12/25/15  6:50 AM  Result Value Ref Range   Glucose-Capillary 103 (H) 65 - 99 mg/dL  Glucose, capillary     Status: Abnormal   Collection Time: 12/25/15 11:52 AM  Result Value Ref Range   Glucose-Capillary 105 (H) 65 - 99 mg/dL  TSH  Status: Abnormal   Collection Time: 12/26/15  4:08 AM  Result Value Ref Range   TSH 18.683 (H) 0.350 - 4.500 uIU/mL  Hemoglobin A1c     Status: Abnormal   Collection Time:  12/26/15  4:08 AM  Result Value Ref Range   Hgb A1c MFr Bld 5.8 (H) 4.8 - 5.6 %    Comment: (NOTE)         Pre-diabetes: 5.7 - 6.4         Diabetes: >6.4         Glycemic control for adults with diabetes: <7.0    Mean Plasma Glucose 120 mg/dL    Comment: (NOTE) Performed At: [facility] [place], [place] [id number] Lindon Romp MD [id number]   Lipid panel     Status: Abnormal   Collection Time: 12/26/15  4:08 AM  Result Value Ref Range   Cholesterol 228 (H) 0 - 200 mg/dL   Triglycerides 157 (H) <150 mg/dL   HDL 62 >40 mg/dL   Total CHOL/HDL Ratio 3.7 RATIO   VLDL 31 0 - 40 mg/dL   LDL Cholesterol 135 (H) 0 - 99 mg/dL    Comment:        Total Cholesterol/HDL:CHD Risk Coronary Heart Disease Risk Table                     Men   Women  1/2 Average Risk   3.4   3.3  Average Risk       5.0   4.4  2 X Average Risk   9.6   7.1  3 X Average Risk  23.4   11.0        Use the calculated Patient Ratio above and the CHD Risk Table to determine the patient's CHD Risk.        ATP III CLASSIFICATION (LDL):  <100     mg/dL   Optimal  100-129  mg/dL   Near or Above                    Optimal  130-159  mg/dL   Borderline  160-189  mg/dL   High  >190     mg/dL   Very High   Troponin I (q 6hr x 3)     Status: None   Collection Time: 12/26/15 11:26 AM  Result Value Ref Range   Troponin I <0.03 <0.031 ng/mL    Comment:        NO INDICATION OF MYOCARDIAL INJURY.   Troponin I (q 6hr x 3)     Status: None   Collection Time: 12/26/15  4:30 PM  Result Value Ref Range   Troponin I <0.03 <0.031 ng/mL    Comment:        NO INDICATION OF MYOCARDIAL INJURY.   Troponin I (q 6hr x 3)     Status: None   Collection Time: 12/26/15 11:03 PM  Result Value Ref Range   Troponin I <0.03 <0.031 ng/mL    Comment:        NO INDICATION OF MYOCARDIAL INJURY.    Dg Chest 1 View  12/26/2015  CLINICAL DATA:  50 year old female with right upper chest pain since 1100  hours today. Initial encounter. EXAM: CHEST 1 VIEW COMPARISON:  10/30/2010 and earlier. FINDINGS: Portable AP semi upright view at 1119 hours. Lung volumes are stable and within normal limits. Normal cardiac size and mediastinal contours. Visualized tracheal air column is within normal  limits. No pneumothorax, pulmonary edema, pleural effusion or confluent pulmonary opacity. IMPRESSION: No acute cardiopulmonary abnormality. Electronically Signed   By: Genevie Ann M.D.   On: 12/26/2015 11:52   Ct Head Wo Contrast  12/25/2015  CLINICAL DATA:  Stroke.  Headache.  Seizures.  Right arm weakness. EXAM: CT HEAD WITHOUT CONTRAST TECHNIQUE: Contiguous axial images were obtained from the base of the skull through the vertex without intravenous contrast. COMPARISON:  12/24/2015. Multiple previous examinations including MRI 10/30/2010. FINDINGS: The brainstem and cerebellum again appear normal. Cerebral hemispheres appear normal MK by CT with the exception of new demonstration of a 3-4 mm hyperdense focus in the left caudate head/anterior body which could represent a tiny infarction with microhemorrhage. No evidence of cortical or large vessel territory infarction. No mass lesion, hemorrhage, hydrocephalus or extra-axial collection. No skull or skullbase lesion. Sinuses are clear. IMPRESSION: Question 3-4 mm hyperdense focus in the left caudate head/ anterior body that could represent a small-vessel caudate infarction with microhemorrhage. This is not absolutely definite. No other focal finding by CT. Electronically Signed   By: Nelson Chimes M.D.   On: 12/25/2015 14:05   Medical Problem List and Plan: 1.  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  DVT Prophylaxis/Anticoagulation: Subcutaneous Lovenox. Monitor platelet counts of any signs of bleeding 3. Pain Management: Tylenol as needed 4. Hypothyroidism. Synthroid 5. Neuropsych: This patient is capable of making decisions on her own behalf. 6. Skin/Wound Care:  Routine skin checks 7. Fluids/Electrolytes/Nutrition: Routine I&O with follow-up chemistries 8. Tobacco/cocaine abuse. Nicoderm patch. Provide counseling 9. Hyperlipidemia. Lipitor   Post Admission Physician Evaluation: 1. 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 2. Patient is admitted to receive collaborative, interdisciplinary care between the physiatrist, rehab nursing staff, and therapy team. 3. Patient's level of medical complexity and substantial therapy needs in context of that medical necessity cannot be provided at a lesser intensity of care such as a SNF. 4. Patient has experienced substantial functional loss from his/her baseline which was documented above under the "Functional History" and "Functional Status" headings.  Judging by the patient's diagnosis, physical exam, and functional history, the patient has potential for functional progress which will result in measurable gains while on inpatient rehab.  These gains will be of substantial and practical use upon discharge  in facilitating mobility and self-care at the household level. 5. Physiatrist will provide 24 hour management of medical needs as well as oversight of the therapy plan/treatment and provide guidance as appropriate regarding the interaction of the two. 6. 24 hour rehab nursing will assist with safety, disease management, medication administration and patient education and help integrate therapy concepts, techniques,education, etc. 7. PT will assess and treat for/with: Lower extremity strength, range of motion, stamina, balance, functional mobility, safety, adaptive techniques and equipment, coping skills, pain control, stroke education.   Goals are: Supervision/ Mod I. 8. OT will assess and treat for/with: ADL's, functional mobility, safety, upper extremity strength, adaptive techniques and equipment, ego support, and community reintegration.   Goals are: Mod I/Ind. Therapy may proceed with showering this  patient. 9. SLP will assess and treat for/with: speech and higher level cognition.  Goals are: Supervision/Mod I. 10. Case Management and Social Worker will assess and treat for psychological issues and discharge planning. 11. Team conference will be held weekly to assess progress toward goals and to determine barriers to discharge. 12. Patient will receive at least 3 hours of therapy per day at least 5 days per week. 13.  ELOS: 12-15 days.       14. Prognosis:  good  Delice Lesch, MD 12/27/2015

## 2015-12-27 NOTE — Care Management Note (Signed)
Case Management Note  Patient Details  Name: Theresa Watkins MRN: [id number] Date of Birth: [birth year]  Subjective/Objective:                    Action/Plan: Plan is for patient to discharge to CIR today. No further needs per CM.   Expected Discharge Date:  12/27/15               Expected Discharge Plan:     In-House Referral:     Discharge planning Services     Post Acute Care Choice:    Choice offered to:     DME Arranged:    DME Agency:     HH Arranged:    HH Agency:     Status of Service:  In process, will continue to follow  Medicare Important Message Given:    Date Medicare IM Given:    Medicare IM give by:    Date Additional Medicare IM Given:    Additional Medicare Important Message give by:     If discussed at [place] of Stay Meetings, dates discussed:    Additional Comments:  Pollie Friar, RN 12/27/2015, 3:39 PM

## 2015-12-28 ENCOUNTER — Inpatient Hospital Stay (HOSPITAL_COMMUNITY): Payer: Medicaid Other | Admitting: Speech Pathology

## 2015-12-28 ENCOUNTER — Inpatient Hospital Stay (HOSPITAL_COMMUNITY): Payer: Medicaid Other | Admitting: Physical Therapy

## 2015-12-28 ENCOUNTER — Inpatient Hospital Stay (HOSPITAL_COMMUNITY): Payer: Self-pay | Admitting: Occupational Therapy

## 2015-12-28 LAB — COMPREHENSIVE METABOLIC PANEL
ALK PHOS: 58 U/L (ref 38–126)
ALT: 11 U/L — AB (ref 14–54)
AST: 15 U/L (ref 15–41)
Albumin: 3.4 g/dL — ABNORMAL LOW (ref 3.5–5.0)
Anion gap: 10 (ref 5–15)
BUN: 11 mg/dL (ref 6–20)
CALCIUM: 8.7 mg/dL — AB (ref 8.9–10.3)
CHLORIDE: 106 mmol/L (ref 101–111)
CO2: 24 mmol/L (ref 22–32)
CREATININE: 0.79 mg/dL (ref 0.44–1.00)
GFR calc non Af Amer: >60 mL/min (ref >60)
Glucose, Bld: 94 mg/dL (ref 65–99)
Potassium: 4.1 mmol/L (ref 3.5–5.1)
SODIUM: 140 mmol/L (ref 135–145)
Total Bilirubin: 0.5 mg/dL (ref 0.3–1.2)
Total Protein: 6.3 g/dL — ABNORMAL LOW (ref 6.5–8.1)

## 2015-12-28 LAB — CBC WITH DIFFERENTIAL/PLATELET
BASOS ABS: 0 10*3/uL (ref 0.0–0.1)
Basophils Relative: 0 %
EOS ABS: 0.1 10*3/uL (ref 0.0–0.7)
EOS PCT: 1 %
HCT: 44.9 % (ref 36.0–46.0)
HEMOGLOBIN: 14.9 g/dL (ref 12.0–15.0)
LYMPHS ABS: 2.1 10*3/uL (ref 0.7–4.0)
LYMPHS PCT: 38 %
MCH: 28.6 pg (ref 26.0–34.0)
MCHC: 33.2 g/dL (ref 30.0–36.0)
MCV: 86.2 fL (ref 78.0–100.0)
Monocytes Absolute: 0.4 10*3/uL (ref 0.1–1.0)
Monocytes Relative: 8 %
NEUTROS PCT: 53 %
Neutro Abs: 2.9 10*3/uL (ref 1.7–7.7)
PLATELETS: 173 10*3/uL (ref 150–400)
RBC: 5.21 MIL/uL — AB (ref 3.87–5.11)
RDW: 13.3 % (ref 11.5–15.5)
WBC: 5.5 10*3/uL (ref 4.0–10.5)

## 2015-12-28 MED ORDER — MAGIC MOUTHWASH
5.0000 mL | Freq: Three times a day (TID) | ORAL | Status: DC
  Administered 2015-12-28 – 2016-01-02 (×16): 5 mL via ORAL
  Filled 2015-12-28 (×19): qty 5

## 2015-12-28 MED ORDER — MAGIC MOUTHWASH
1.0000 mL | Freq: Three times a day (TID) | ORAL | Status: DC
  Filled 2015-12-28 (×3): qty 5

## 2015-12-28 MED ORDER — HYDROCODONE-ACETAMINOPHEN 5-325 MG PO TABS
1.0000 | ORAL_TABLET | Freq: Once | ORAL | Status: AC
  Administered 2015-12-28: 1 via ORAL
  Filled 2015-12-28: qty 1

## 2015-12-28 NOTE — Progress Notes (Signed)
Patient information reviewed and entered into e[facility] system by Carrington Olazabal, RN, CRRN, PPS Coordinator.  Information including medical coding and functional independence measure will be reviewed and updated through discharge.    

## 2015-12-28 NOTE — Evaluation (Signed)
Physical Therapy Assessment and Plan  Patient Details  Name: Theresa Watkins MRN: [id number] Date of Birth: 07-06-1966  PT Diagnosis: Abnormality of gait, Difficulty walking, Hemiparesis dominant, Impaired cognition and Muscle weakness Rehab Potential: Excellent ELOS: 5-7 days   Today's Date: 12/28/2015 PT Individual Time: 0900-1015    PT Individual Time Calculation: 75 min  Problem List:  Patient Active Problem List   Diagnosis Date Noted  . CVA (cerebral infarction) 12/27/2015  . Hypothyroidism due to acquired atrophy of thyroid   . HLD (hyperlipidemia)   . Hemiparesis affecting dominant side as late effect of stroke ([place])   . 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 Tobacco abuse   . Cocaine abuse   . ETOH abuse   . Hypothyroidism (acquired) 12/24/2015  . Acute ischemic stroke ([place]) 12/24/2015  . Stroke ([place]) 12/24/2015  . Drug abuse, cocaine type 05/09/2014    Past Medical History:  Past Medical History  Diagnosis Date  . Headache(784.0)   . Seizures ([place])   . Thyroid cancer ([place]) [address]11  . Bronchitis   . Urinary tract infection   . Ovarian cyst   . Fibroid   . Abnormal Pap smear   . Trichomonas    Past Surgical History:  Past Surgical History  Procedure Laterality Date  . Abdominal hysterectomy    . Laparoscopic  1988    removal  of ectopic preg, ruptured tube  . Therapeutic abortion    . Cesarean section    . Thyroidectomy  march 2011    cancer  . Goiter      removed    Assessment & Plan Clinical Impression: Theresa Watkins is a 50 y.o. right handed female with history of hypothyroidism, tobacco and polysubstance abuse. Per chart review patient lives alone. 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 She presented 12/24/2015 with fall and right sided weakness. Urine drug screen positive for cocaine. CT of the head showed a 3-4 millimeter hyperdense focus in the left caudate head and anterior body  that could represent possible small vessel caudate infarct with microhemorrhage. Echocardiogram with ejection fraction of 60% no wall motion abnormalities and a type1R atrial septal aneurysm felt to be possibly small PFO with left-to-right flow by Doppler. Carotid Dopplers in no ICA stenosis. Patient did not receive TPA. Neurology consulted placed on aspirin for CVA prophylaxis. Subcutaneous Lovenox for DVT prophylaxisPatient transferred to CIR on 12/27/2015 .   Patient currently requires min with mobility secondary to decreased standing balance, decreased postural control, hemiplegia and decreased balance strategies.  Prior to hospitalization, patient was independent  with mobility and lived with Alone in a House home.  Home access is 3 stepsStairs to enter.  Patient will benefit from skilled PT intervention to maximize safe functional mobility, minimize fall risk and decrease caregiver burden for planned discharge home alone.  Anticipate patient will not need PT follow up at discharge.  PT - End of Session Activity Tolerance: Endurance does not limit participation in activity Endurance Deficit: No PT Assessment Rehab Potential (ACUTE/IP ONLY): Excellent Barriers to Discharge: Decreased caregiver support PT Patient demonstrates impairments in the following area(s): Balance;Safety;Behavior;Sensory;Endurance;Motor PT Transfers Functional Problem(s): Bed Mobility;Bed to Chair;Car;Furniture;Floor PT Locomotion Functional Problem(s): Ambulation;Stairs PT Plan PT Intensity: Minimum of 1-2 x/day ,45 to 90 minutes PT Frequency: 5 out of 7 days PT Duration Estimated Length of Stay: 5-7 days PT Treatment/Interventions: Ambulation/gait training;Balance/vestibular training;Cognitive remediation/compensation;Community reintegration;Discharge planning;Disease management/prevention;Functional mobility training;Neuromuscular re-education;Patient/family education;Psychosocial support;Stair training;Therapeutic  Activities;Therapeutic Exercise;UE/LE Coordination activities;UE/LE Strength taining/ROM PT Transfers Anticipated Outcome(s): mod I PT Locomotion Anticipated Outcome(s): mod I ambulation PT Recommendation Recommendations for Other Services: Neuropsych consult Follow Up Recommendations: None Patient destination: Home Equipment Recommended: None recommended by PT  Skilled Therapeutic Intervention Pt received seated in bed, no c/o pain and agreeable to treatment. Initial PT evaluation performed and completed. Initiated therapeutic intervention including gait and stair training, balance assessment with results as described below. Educated pt in stair climbing with only R rail due to RUE weakness with sideways ascent. Educated pt on PT goals, estimated length of stay, safety plan and fall prevention with emphasis on using call bell system after pt reported during session that she had walked back to her bed alone after using the restroom last night. Pt in agreement with all the above. Remained seated in bed at completion of session, alarm intact and all needs within reach.   PT Evaluation Precautions/Restrictions Precautions Precautions: Fall Precaution Comments: risk for Rt shoulder subluxation Restrictions Weight Bearing Restrictions: No General Chart Reviewed: Yes Response to Previous Treatment: Patient with no complaints from previous session. Family/Caregiver Present: No  Pain Pain Assessment Pain Assessment: No/denies pain Pain Score: 0-No pain Pain Location: Mouth Pain Orientation: Lower Pain Descriptors / Indicators: Aching Pain Onset: On-going Pain Intervention(s): RN made aware;Emotional support Home Living/Prior Functioning Home Living Living Arrangements: Alone Available Help at Discharge: Family;Available PRN/intermittently Type of Home: House Home Access: Stairs to enter [employer] of Steps: 3 steps Entrance Stairs-Rails: None Home Layout: Bed/bath  upstairs;Two level Alternate Level Stairs-Number of Steps: 17 steps Alternate Level Stairs-Rails: Right Bathroom Shower/Tub: Tub/shower unit;Curtain Bathroom Toilet: Standard Bathroom Accessibility: Yes Additional Comments: pt has cats at home  Lives With: Alone Prior Function Level of Independence: Independent with basic ADLs;Independent with homemaking with ambulation;Independent with gait;Independent with transfers  Able to Take Stairs?: Yes 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 Comments: independent and driving pta Vision/Perception     Cognition Overall Cognitive Status: Impaired/Different from baseline Arousal/Alertness: Awake/alert Orientation Level: Oriented X4 Attention: Selective Selective Attention: Appears intact Memory: Impaired Memory Impairment: Decreased short term memory Decreased Short Term Memory: Verbal complex Awareness: Appears intact Problem Solving: Appears intact Behaviors: Impulsive Safety/Judgment: Impaired Comments: The following are pt's scores on subsections of ALFA standardized assessment: 90% accuracy for daily math calculations, 100% accuracy for money management, 100% accuracy for pill organization and reading medicine labels, 100% accuracy for following written instructions  Sensation Sensation Light Touch: Impaired Detail Light Touch Impaired Details: Impaired RUE;Impaired RLE Stereognosis: Not tested Hot/Cold: Not tested Proprioception: Impaired by gross assessment Coordination Gross Motor Movements are Fluid and Coordinated: Yes Heel Shin Test: WFL BLE Motor  Motor Motor: Hemiplegia Motor - Skilled Clinical Observations: R hemiparesis, impaired balance strategies and righting reactions  Mobility Bed Mobility Bed Mobility: Supine to Sit;Sit to Supine Supine to Sit: 5: Supervision Supine to Sit Details: Verbal cues for precautions/safety Sit to Supine: 5: Supervision Sit to Supine - Details: Verbal cues for  precautions/safety Transfers Transfers: Yes Stand Pivot Transfers: 5: Supervision Stand Pivot Transfer Details: Verbal cues for precautions/safety;Verbal cues for technique Locomotion  Ambulation Ambulation: Yes Ambulation/Gait Assistance: 4: Min guard Ambulation Distance (Feet): 300 Feet Assistive device: None Gait Gait: Yes Gait Pattern: Impaired Gait Pattern: Lateral hip instability (variable step length/width) Gait velocity: 2.91 ft/sec High Level Ambulation High Level Ambulation: Direction changes;Sudden stops;Head turns Direction Changes: no change in performance, min guard Sudden Stops: no change in performance, min guard Head Turns: no change in  performance, min guard Stairs / Additional Locomotion Stairs: Yes Stairs Assistance: 4: Min guard Stair Management Technique: One rail Left;Alternating pattern;Forwards Number of Stairs: 12 Height of Stairs: 3 Wheelchair Mobility Wheelchair Mobility: No  Trunk/Postural Assessment  Cervical Assessment Cervical Assessment: Within Functional Limits Thoracic Assessment Thoracic Assessment: Within Functional Limits Lumbar Assessment Lumbar Assessment: Within Functional Limits Postural Control Postural Control: Deficits on evaluation (mild impairment in righting reactions and balance strategies)  Balance Balance Balance Assessed: Yes Standardized Balance Assessment Standardized Balance Assessment: Berg Balance Test;Dynamic Gait Index Berg Balance Test Sit to Stand: Able to stand without using hands and stabilize independently Standing Unsupported: Able to stand safely 2 minutes Sitting with Back Unsupported but Feet Supported on Floor or Stool: Able to sit safely and securely 2 minutes Stand to Sit: Sits safely with minimal use of hands Transfers: Able to transfer safely, minor use of hands Standing Unsupported with Eyes Closed: Able to stand 10 seconds with supervision Standing Ubsupported with Feet Together: Able to place  feet together independently and stand 1 minute safely From Standing, Reach Forward with Outstretched Arm: Can reach confidently >25 cm (10") From Standing Position, Pick up Object from Floor: Able to pick up shoe safely and easily From Standing Position, Turn to Look Behind Over each Shoulder: Looks behind from both sides and weight shifts well Turn 360 Degrees: Able to turn 360 degrees safely in 4 seconds or less Standing Unsupported, Alternately Place Feet on Step/Stool: Able to stand independently and safely and complete 8 steps in 20 seconds Standing Unsupported, One Foot in Front: Able to plae foot ahead of the other independently and hold 30 seconds Standing on One Leg: Able to lift leg independently and hold equal to or more than 3 seconds Total Score: 52 Dynamic Gait Index Level Surface: Normal Change in Gait Speed: Normal Gait with Horizontal Head Turns: Normal Gait with Vertical Head Turns: Normal Gait and Pivot Turn: Normal Step Over Obstacle: Mild Impairment Step Around Obstacles: Normal Steps: Normal Total Score: 23 Dynamic Sitting Balance Dynamic Sitting - Balance Support: Feet supported;During functional activity Dynamic Sitting - Level of Assistance: 5: Stand by assistance Static Standing Balance Static Standing - Balance Support: No upper extremity supported;During functional activity Static Standing - Level of Assistance: 5: Stand by assistance Static Stance: Eyes closed Static Stance: Eyes Closed: x30 sec in Berg, standbyA Dynamic Standing Balance Dynamic Standing - Balance Support: No upper extremity supported;During functional activity Dynamic Standing - Level of Assistance: 5: Stand by assistance Dynamic Standing - Balance Activities: Forward lean/weight shifting;Lateral lean/weight shifting;Reaching for objects Extremity Assessment    LUE Assessment LUE Assessment: Within Functional Limits RLE Assessment RLE Assessment: Exceptions to [facility] (mild weakness R  hamstrings 4+/5, otherwise 5/5) LLE Assessment LLE Assessment: Within Functional Limits   See Function Navigator for Current Functional Status.   Refer to Care Plan for Long Term Goals  Recommendations for other services: Neuropsych  Discharge Criteria: Patient will be discharged from PT if patient refuses treatment 3 consecutive times without medical reason, if treatment goals not met, if there is a change in medical status, if patient makes no progress towards goals or if patient is discharged from hospital.  The above assessment, treatment plan, treatment alternatives and goals were discussed and mutually agreed upon: by patient  Luberta Mutter 12/28/2015, 2:24 PM

## 2015-12-28 NOTE — Progress Notes (Signed)
12/28/15  [id number] nursing Patient refused Lipitor . RN explained the reason why they are giving this med based on her lipid profile done 2/7 and diagnosis of hyperlipidemia. Claims she had heard bad reports about  this medicine and would rather have simvastatin . Note done for MD to address. Will  report to incoming shift RN.

## 2015-12-28 NOTE — Progress Notes (Signed)
Subjective/Complaints: No issues overnite No pains  ROS- no CP or SOB, some increased sensitivity on Right   Objective: Vital Signs: Blood pressure 120/71, pulse 53, temperature 98.3 F (36.8 C), temperature source Oral, resp. rate 20, SpO2 100 %. Dg Chest 1 View  12/26/2015  CLINICAL DATA:  50 year old female with right upper chest pain since 1100 hours today. Initial encounter. EXAM: CHEST 1 VIEW COMPARISON:  10/30/2010 and earlier. FINDINGS: Portable AP semi upright view at 1119 hours. Lung volumes are stable and within normal limits. Normal cardiac size and mediastinal contours. Visualized tracheal air column is within normal limits. No pneumothorax, pulmonary edema, pleural effusion or confluent pulmonary opacity. IMPRESSION: No acute cardiopulmonary abnormality. Electronically Signed   By: Genevie Ann M.D.   On: 12/26/2015 11:52   Results for orders placed or performed during the hospital encounter of 12/27/15 (from the past 72 hour(s))  CBC WITH DIFFERENTIAL     Status: Abnormal   Collection Time: 12/28/15  6:16 AM  Result Value Ref Range   WBC 5.5 4.0 - 10.5 K/uL   RBC 5.21 (H) 3.87 - 5.11 MIL/uL   Hemoglobin 14.9 12.0 - 15.0 g/dL   HCT 44.9 36.0 - 46.0 %   MCV 86.2 78.0 - 100.0 fL   MCH 28.6 26.0 - 34.0 pg   MCHC 33.2 30.0 - 36.0 g/dL   RDW 13.3 11.5 - 15.5 %   Platelets 173 150 - 400 K/uL   Neutrophils Relative % 53 %   Neutro Abs 2.9 1.7 - 7.7 K/uL   Lymphocytes Relative 38 %   Lymphs Abs 2.1 0.7 - 4.0 K/uL   Monocytes Relative 8 %   Monocytes Absolute 0.4 0.1 - 1.0 K/uL   Eosinophils Relative 1 %   Eosinophils Absolute 0.1 0.0 - 0.7 K/uL   Basophils Relative 0 %   Basophils Absolute 0.0 0.0 - 0.1 K/uL      General: No acute distress Mood and affect are appropriate Heart: Regular rate and rhythm no rubs murmurs or extra sounds Lungs: Clear to auscultation, breathing unlabored, no rales or wheezes Abdomen: Positive bowel sounds, soft nontender to palpation,  nondistended Extremities: No clubbing, cyanosis, or edema Skin: No evidence of breakdown, no evidence of rash Neurologic: Cranial nerves II through XII intact, motor strength is 5/5 in L deltoid, bicep, tricep, grip, hip flexor, knee extensors, ankle dorsiflexor and plantar flexor, 3-/5 on Right  Sensory exam normal sensation to light touch and proprioception in bilateral upper and lower extremities Cerebellar exam normal finger to nose to finger as well as heel to shin in bilateral upper and lower extremities Musculoskeletal: Full range of motion in all 4 extremities. No joint swelling   Assessment/Plan: 1. 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 Physiatrist is providing close team supervision and 24 hour management of active medical problems listed below. Physiatrist and rehab team continue to assess barriers to discharge/monitor patient progress toward functional and medical goals. FIM:                                  Medical Problem List and Plan: 1. 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 DVT Prophylaxis/Anticoagulation: Subcutaneous Lovenox. Monitor platelet counts of any signs of bleeding 3. Pain Management: Tylenol as needed 4. Hypothyroidism. Synthroid 5. Neuropsych: This patient is capable of making decisions  on her own behalf. 6. Skin/Wound Care: Routine skin checks 7. Fluids/Electrolytes/Nutrition: Routine I&O with follow-up chemistries 8. Tobacco/cocaine abuse. Nicoderm patch. Provide counseling 9. Hyperlipidemia. Lipitor 10.  Cocaine abuse- I discussed this as a major contributor to her CVA and discussed recurrence risk NS/SW to advise on community resources, although pt denies freq use LOS (Days) 1 A FACE TO FACE EVALUATION WAS PERFORMED  KIRSTEINS,ANDREW E 12/28/2015, 6:57 AM

## 2015-12-28 NOTE — Care Management Note (Signed)
Inpatient Rehabilitation Center Individual Statement of Services  Patient Name:  Theresa Watkins  Date:  12/28/2015  Welcome to the [facility].  Our goal is to provide you with an individualized program based on your diagnosis and situation, designed to meet your specific needs.  With this comprehensive rehabilitation program, you will be expected to participate in at least 3 hours of rehabilitation therapies Monday-Friday, with modified therapy programming on the weekends.  Your rehabilitation program will include the following services:  Physical Therapy (PT), Occupational Therapy (OT), 24 hour per day rehabilitation nursing, Therapeutic Recreaction (TR), Case Management (Social Worker), Rehabilitation Medicine, Nutrition Services, Speech Therapy and Pharmacy Services  Weekly team conferences will be held on Wednesday to discuss your progress.  Your Social Worker will talk with you frequently to get your input and to update you on team discussions.  Team conferences with you and your family in attendance may also be held.  Expected length of stay: 5-7 days Overall anticipated outcome: Mod/i level  Depending on your progress and recovery, your program may change. Your Social Worker will coordinate services and will keep you informed of any changes. Your Social Worker's name and contact numbers are listed  below.  The following services may also be recommended but are not provided by the [facility] will be made to provide these services after discharge if needed.  Arrangements include referral to agencies that provide these services.  Your insurance has been verified to be:  No insurance-question to apply for Medicaid Your primary doctor is:  None  Pertinent information will be shared with your doctor and  your insurance company.  Social Worker:  Ovidio Kin, [place] or ([phone]  Information discussed with and copy given to patient by: Elease Hashimoto, 12/28/2015, 10:49 AM

## 2015-12-28 NOTE — Progress Notes (Signed)
Social Work  Social Work Assessment and Plan  Patient Details  Name: Theresa Watkins MRN: [id number] Date of Birth: 1966/03/07  Today's Date: 12/28/2015  Problem List:  Patient Active Problem List   Diagnosis Date Noted  . CVA (cerebral infarction) 12/27/2015  . Hypothyroidism due to acquired atrophy of thyroid   . HLD (hyperlipidemia)   . Hemiparesis affecting dominant side as late effect of stroke ([place])   . 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 Tobacco abuse   . Cocaine abuse   . ETOH abuse   . Hypothyroidism (acquired) 12/24/2015  . Acute ischemic stroke ([place]) 12/24/2015  . Stroke ([place]) 12/24/2015  . Drug abuse, cocaine type 05/09/2014   Past Medical History:  Past Medical History  Diagnosis Date  . Headache(784.0)   . Seizures ([place])   . Thyroid cancer ([place]) [address]11  . Bronchitis   . Urinary tract infection   . Ovarian cyst   . Fibroid   . Abnormal Pap smear   . Trichomonas    Past Surgical History:  Past Surgical History  Procedure Laterality Date  . Abdominal hysterectomy    . Laparoscopic  1988    removal  of ectopic preg, ruptured tube  . Therapeutic abortion    . Cesarean section    . Thyroidectomy  march 2011    cancer  . Goiter      removed   Social History:  reports that she has been smoking Cigarettes.  She has a 12.5 pack-year smoking history. She has never used smokeless tobacco. She reports that she drinks alcohol. She reports that she uses illicit drugs (Cocaine).  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  She has friends but now is realizing they are a bad influence on her and she plans to stick to herslef when discharging from the hospital. 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    Abuse/Neglect Physical Abuse: Denies Verbal Abuse: Denies Sexual Abuse: Denies Exploitation of patient/patient's resources: Denies Self-Neglect: Denies  Emotional Status Pt's affect, behavior adn adjustment status: Pt has always been independent and able to take care of herself and raised her son on her own. She prides herself on being self sufficient and not having to ask others for help. She is not good at this and will not ask if she can help it. Her mom she doesn;t want to trouble by helping her. Recent Psychosocial Issues: Other health issues was going to [facility] but then stopped.  Pyschiatric History: No history feels she is coping with her stroke and deficits and is making progress which is encouraging to her, she is concerned about her arm  and speech. She would benefit from seeing neuro-psych due to substanc abuse issues also, she is a young person and it is her second stroke. Substance Abuse History: Tobacco, ETOH and Cocaine, although she admits she has done things she needs to stop she will not admit to crack use. Even though the toxic screen shows she is positive for on admission. She relaizes she needs to quit all of these vices. She is aware of the resources available and  feels she can quit on her own.  Patient / Family Perceptions, Expectations & Goals Pt/Family understanding of illness & functional limitations: Pt is able to explain her stroke and talks with the MD daily. She wants to be included in what medications she is taking due to she reads a lot of information on and will refuse certain ones she doesn't agree with taking. She wants to be on a regular diet over heart healthy, spoke wiht Dan-PA he has switched it for her. Premorbid pt/family roles/activities: Daughter, Mom, Sister, Friend, etc Anticipated changes in roles/activities/participation: resume Pt/family expectations/goals: Pt states: " I want to be able to take car eof myself before I leave here, I will not ask my Mom to help me."  Mom states: " She is very independent and does what she wants."  [employer]: Other (Comment) ([facility]-year ago) Premorbid Home Care/DME Agencies: None Transportation available at discharge: Family and friends Resource referrals recommended: Neuropsychology, Support group (specify)  Discharge Planning Living Arrangements: Alone Support Systems: Children, Parent, Other relatives, Friends/neighbors Type of Residence: Private residence Insurance Resources: Self-pay (May apply for Medicaid prior to discharge) 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 She needs to be mod/i before leaving here, or other alternatives will need to be addressed. Pt is fairly high levle wiht her ambulation, her arm and speech seem to be her main deficits from this stroke. Will work on disability and Medicaid applicaitons if MD  feels would be eligible and PCP follow at discharge. Social Work Anticipated Follow Up Needs: HH/OP, Support Group  Clinical Impression Pleasant talkative female who is motivated to improve and aware of her need to change her lifestyle habits to prevent from having another stroke. Her Mom and sister are supportive and willing to check on her at discharge. Will need to get pt hooked into [facility] or [facility] for follow up and assist with medications. She would benefit from seeing neuro-psych while here, she will probably a short length of stay due To high level.   Elease Hashimoto 12/28/2015, 11:53 AM

## 2015-12-28 NOTE — Evaluation (Signed)
Occupational Therapy Assessment and Plan  Patient Details  Name: Theresa Watkins MRN: [id number] Date of Birth: 04-14-1966  OT Diagnosis: acute pain, cognitive deficits, hemiplegia affecting dominant side and muscle weakness (generalized) Rehab Potential: Rehab Potential (ACUTE ONLY): Excellent ELOS: 5-7 days   Today's Date: 12/28/2015 OT Individual Time: 1305-1405 OT Individual Time Calculation (min): 60 min     Problem List:  Patient Active Problem List   Diagnosis Date Noted  . CVA (cerebral infarction) 12/27/2015  . Hypothyroidism due to acquired atrophy of thyroid   . HLD (hyperlipidemia)   . Hemiparesis affecting dominant side as late effect of stroke ([place])   . 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 Tobacco abuse   . Cocaine abuse   . ETOH abuse   . Hypothyroidism (acquired) 12/24/2015  . Acute ischemic stroke ([place]) 12/24/2015  . Stroke ([place]) 12/24/2015  . Drug abuse, cocaine type 05/09/2014    Past Medical History:  Past Medical History  Diagnosis Date  . Headache(784.0)   . Seizures ([place])   . Thyroid cancer ([place]) [address]11  . Bronchitis   . Urinary tract infection   . Ovarian cyst   . Fibroid   . Abnormal Pap smear   . Trichomonas    Past Surgical History:  Past Surgical History  Procedure Laterality Date  . Abdominal hysterectomy    . Laparoscopic  1988    removal  of ectopic preg, ruptured tube  . Therapeutic abortion    . Cesarean section    . Thyroidectomy  march 2011    cancer  . Goiter      removed    Assessment & Plan Clinical Impression: Patient is a 50 y.o. right handed female with history of hypothyroidism, tobacco and polysubstance abuse. Per chart review patient lives alone. 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 She presented 12/24/2015 with fall and right sided weakness. Urine drug screen positive for cocaine. CT of the head showed a 3-4 millimeter hyperdense focus in the left  caudate head and anterior body that could represent possible small vessel caudate infarct with microhemorrhage. Echocardiogram with ejection fraction of 60% no wall motion abnormalities and a type1R atrial septal aneurysm felt to be possibly small PFO with left-to-right flow by Doppler. Carotid Dopplers in no ICA stenosis. Patient did not receive TPA. Neurology consulted placed on aspirin for CVA prophylaxis. Subcutaneous Lovenox for DVT prophylaxis. Therapy evaluations initiated noted fatigue lethargy limited ability to participate.    Patient transferred to CIR on 12/27/2015 .    Patient currently requires supervision with basic self-care skills secondary to muscle weakness and muscle paralysis, impaired timing and sequencing, abnormal tone, unbalanced muscle activation and decreased coordination, decreased safety awareness and decreased memory and decreased standing balance.  Prior to hospitalization, patient could complete ADLs with independent .  Patient will benefit from skilled intervention to increase independence with basic self-care skills and increase level of independence with iADL prior to discharge home independently.  Anticipate patient will require intermittent supervision and follow up outpatient.  OT - End of Session Activity Tolerance: Tolerates 30+ min activity without fatigue Endurance Deficit: No OT Assessment Rehab Potential (ACUTE ONLY): Excellent OT Patient demonstrates impairments in the following area(s): Balance;Motor;Pain;Safety;Sensory OT Basic ADL's Functional Problem(s): Grooming;Bathing;Dressing;Toileting OT Advanced ADL's Functional Problem(s): Simple Meal Preparation;Laundry;Light Housekeeping OT Transfers Functional Problem(s): Toilet;Tub/Shower OT Additional Impairment(s): Fuctional Use of Upper Extremity OT Plan OT Intensity: Minimum of 1-2 x/day, 45 to 90 minutes  OT Frequency: 5 out of 7 days OT Duration/Estimated Length of Stay: 5-7 days OT  Treatment/Interventions: Balance/vestibular training;Discharge planning;Disease mangement/prevention;DME/adaptive equipment instruction;Functional mobility training;Neuromuscular re-education;Pain management;Patient/family education;Psychosocial support;Self Care/advanced ADL retraining;Therapeutic Activities;Therapeutic Exercise;UE/LE Strength taining/ROM;UE/LE Coordination activities OT Self Feeding Anticipated Outcome(s): Mod I OT Basic Self-Care Anticipated Outcome(s): Mod I OT Toileting Anticipated Outcome(s): Mod I OT Bathroom Transfers Anticipated Outcome(s): Mod I OT Recommendation Patient destination: Home Follow Up Recommendations: Outpatient OT Equipment Recommended: Tub/shower bench   Skilled Therapeutic Intervention OT eval completed with discussion of OT purpose, POC, ELOS, and goals.  ADL assessment completed with supervision overall, with pt able to demonstrate single leg stance during bathing and LB dressing.  Pt demonstrates decreased ROM and strength in dominant RUE impacting her ability to complete self-care tasks and IADLs.  Pt easily distracted throughout and requiring redirection to current task.    OT Evaluation Precautions/Restrictions  Precautions Precautions: Fall Precaution Comments: risk for Rt shoulder subluxation Restrictions Weight Bearing Restrictions: No General   Vital Signs Therapy Vitals Temp: 98.1 F (36.7 C) Temp Source: Oral Pulse Rate: 64 Resp: 18 BP: (!) 137/94 mmHg Patient Position (if appropriate): Lying Oxygen Therapy SpO2: 100 % O2 Device: Not Delivered Pain Pain Assessment Pain Assessment: 0-10 Pain Score: 8  Pain Location: Mouth Pain Orientation: Lower Pain Descriptors / Indicators: Aching Pain Onset: On-going Pain Intervention(s): Medication (See eMAR) Home Living/Prior Functioning Home Living Living Arrangements: Alone Available Help at Discharge: Family, Available PRN/intermittently Type of Home: House Home Access:  Stairs to enter Technical brewer of Steps: 3 steps Entrance Stairs-Rails: None Home Layout: Bed/bath upstairs, Two level Alternate Level Stairs-Number of Steps: 17 steps Alternate Level Stairs-Rails: Right Bathroom Shower/Tub: Tub/shower unit, Architectural technologist: Standard Bathroom Accessibility: Yes Additional Comments: pt has cats at home  Lives With: Alone IADL History Homemaking Responsibilities: Yes Meal Prep Responsibility: Primary Laundry Responsibility: Primary Cleaning Responsibility: Primary Bill Paying/Finance Responsibility: Primary Shopping Responsibility: Primary Current License: Yes Mode of Transportation: Car Education: college  Occupation: 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 Prior Function Level of Independence: Independent with basic ADLs, Independent with homemaking with ambulation, Independent with gait, Independent with transfers  Able to Take Stairs?: Yes 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 Comments: independent and driving pta ADL  See Function Navigator Vision/Perception  Vision- History Baseline Vision/History: Wears glasses Wears Glasses: Reading only Patient Visual Report: No change from baseline Vision- Assessment Vision Assessment?: Vision impaired- to be further tested in functional context  Cognition Overall Cognitive Status: Impaired/Different from baseline Arousal/Alertness: Awake/alert Orientation Level: Person;Place;Situation Person: Oriented Place: Oriented Situation: Oriented Year: 2017 Month: February Day of Week: Correct Memory: Impaired Memory Impairment: Decreased short term memory Decreased Short Term Memory: Verbal complex Immediate Memory Recall: Sock;Blue;Bed Memory Recall: Sock;Blue;Bed Memory Recall Sock: Without Cue Memory Recall Blue: Without Cue Memory Recall Bed: Without Cue Attention: Selective Selective Attention: Appears intact Awareness: Appears  intact Problem Solving: Appears intact Behaviors: Impulsive Safety/Judgment: Impaired Comments: The following are pt's scores on subsections of ALFA standardized assessment: 90% accuracy for daily math calculations, 100% accuracy for money management, 100% accuracy for pill organization and reading medicine labels, 100% accuracy for following written instructions  Sensation Sensation Light Touch: Impaired Detail Light Touch Impaired Details: Impaired RUE;Impaired RLE Stereognosis: Not tested Hot/Cold: Not tested Proprioception: Impaired by gross assessment Coordination Gross Motor Movements are Fluid and Coordinated: Yes Fine Motor Movements are Fluid and Coordinated: No Heel Shin Test: [facility] BLE 9 Hole Peg Test: Unable to assess  as pt  unable to complete thumb opposition Motor  Motor Motor: Hemiplegia Motor - Skilled Clinical Observations: R hemiparesis, impaired balance strategies and righting reactions Mobility  Bed Mobility Bed Mobility: Supine to Sit;Sit to Supine Supine to Sit: 5: Supervision Supine to Sit Details: Verbal cues for precautions/safety Sit to Supine: 5: Supervision Sit to Supine - Details: Verbal cues for precautions/safety  Trunk/Postural Assessment  Cervical Assessment Cervical Assessment: Within Functional Limits Thoracic Assessment Thoracic Assessment: Within Functional Limits Lumbar Assessment Lumbar Assessment: Within Functional Limits Postural Control Postural Control: Deficits on evaluation (mild impairment in righting reactions and balance strategies)  Balance Balance Balance Assessed: Yes Standardized Balance Assessment Standardized Balance Assessment: Berg Balance Test;Dynamic Gait Index Berg Balance Test Sit to Stand: Able to stand without using hands and stabilize independently Standing Unsupported: Able to stand safely 2 minutes Sitting with Back Unsupported but Feet Supported on Floor or Stool: Able to sit safely and securely 2  minutes Stand to Sit: Sits safely with minimal use of hands Transfers: Able to transfer safely, minor use of hands Standing Unsupported with Eyes Closed: Able to stand 10 seconds with supervision Standing Ubsupported with Feet Together: Able to place feet together independently and stand 1 minute safely From Standing, Reach Forward with Outstretched Arm: Can reach confidently >25 cm (10") From Standing Position, Pick up Object from Floor: Able to pick up shoe safely and easily From Standing Position, Turn to Look Behind Over each Shoulder: Looks behind from both sides and weight shifts well Turn 360 Degrees: Able to turn 360 degrees safely in 4 seconds or less Standing Unsupported, Alternately Place Feet on Step/Stool: Able to stand independently and safely and complete 8 steps in 20 seconds Standing Unsupported, One Foot in Front: Able to plae foot ahead of the other independently and hold 30 seconds Standing on One Leg: Able to lift leg independently and hold equal to or more than 3 seconds Total Score: 52 Dynamic Gait Index Level Surface: Normal Change in Gait Speed: Normal Gait with Horizontal Head Turns: Normal Gait with Vertical Head Turns: Normal Gait and Pivot Turn: Normal Step Over Obstacle: Mild Impairment Step Around Obstacles: Normal Steps: Normal Total Score: 23 Dynamic Sitting Balance Dynamic Sitting - Balance Support: Feet supported;During functional activity Dynamic Sitting - Level of Assistance: 5: Stand by assistance Static Standing Balance Static Standing - Balance Support: No upper extremity supported;During functional activity Static Standing - Level of Assistance: 5: Stand by assistance Static Stance: Eyes closed Static Stance: Eyes Closed: x30 sec in Berg, standbyA Dynamic Standing Balance Dynamic Standing - Balance Support: No upper extremity supported;During functional activity Dynamic Standing - Level of Assistance: 5: Stand by assistance Dynamic Standing -  Balance Activities: Forward lean/weight shifting;Lateral lean/weight shifting;Reaching for objects Extremity/Trunk Assessment RUE Assessment RUE Assessment: Exceptions to [facility] (shoulder flexion approx 100 degrees, elbow WFL, strength grossly 3/5) LUE Assessment LUE Assessment: Within Functional Limits   See Function Navigator for Current Functional Status.   Refer to Care Plan for Long Term Goals  Recommendations for other services: None  Discharge Criteria: Patient will be discharged from OT if patient refuses treatment 3 consecutive times without medical reason, if treatment goals not met, if there is a change in medical status, if patient makes no progress towards goals or if patient is discharged from hospital.  The above assessment, treatment plan, treatment alternatives and goals were discussed and mutually agreed upon: by patient  Simonne Come 12/28/2015, 4:02 PM

## 2015-12-28 NOTE — Evaluation (Signed)
Speech Language Pathology Assessment and Plan  Patient Details  Name: Theresa Watkins MRN: [id number] Date of Birth: [birth year]  SLP Diagnosis: Cognitive Impairments;Dysarthria  Rehab Potential: Good ELOS: 5-7 days     Today's Date: 12/28/2015 SLP Individual Time: 0800-0900 SLP Individual Time Calculation (min): 60 min   Problem List:  Patient Active Problem List   Diagnosis Date Noted  . CVA (cerebral infarction) 12/27/2015  . Hypothyroidism due to acquired atrophy of thyroid   . HLD (hyperlipidemia)   . Hemiparesis affecting dominant side as late effect of stroke ([place])   . 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 Tobacco abuse   . Cocaine abuse   . ETOH abuse   . Hypothyroidism (acquired) 12/24/2015  . Acute ischemic stroke ([place]) 12/24/2015  . Stroke ([place]) 12/24/2015  . Drug abuse, cocaine type 05/09/2014   Past Medical History:  Past Medical History  Diagnosis Date  . Headache(784.0)   . Seizures ([place])   . Thyroid cancer ([place]) 20011  . Bronchitis   . Urinary tract infection   . Ovarian cyst   . Fibroid   . Abnormal Pap smear   . Trichomonas    Past Surgical History:  Past Surgical History  Procedure Laterality Date  . Abdominal hysterectomy    . Laparoscopic  1988    removal  of ectopic preg, ruptured tube  . Therapeutic abortion    . Cesarean section    . Thyroidectomy  march 2011    cancer  . Goiter      removed    Assessment / Plan / Recommendation Clinical Impression  Theresa Watkins is a 50 y.o. right handed female with history of hypothyroidism, tobacco and polysubstance abuse. Per chart review patient lives alone. 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 She presented 12/24/2015 with fall and right sided weakness. Urine drug screen positive for cocaine. CT of the head showed a 3-4 millimeter hyperdense focus in the left caudate head and anterior body that could represent possible small  vessel caudate infarct with microhemorrhage. Pt admitted to CIR on 12/27/2015.  SLP evaluation completed on 12/28/2015 with the following results: Pt presents with mild higher level cognitive deficits due to impaired short term recall of new information, question whether this is baseline but no family present to verify.  Pt also exhibits a mild dysarthria characterized by right sided labial weakness which impacts articulatory precision and intelligibility in conversations.  Coordination and lingual strength appears to have improved since initial evaluation, but pt continues to present with decreased awareness of verbal errors and requires assist to monitor and correct phonemic production in conversations.  As a result, pt would benefit from skilled ST follow up while inpatient to address cognitive-linguistic function.  Pt may benefit from ST follow up at next level of care in addition to at least intermittent supervision for medication due to memory deficits. SLP will update discharge recommendations pending progress made while inpatient.    Skilled Therapeutic Interventions          Cognitive-linguistic evaluation completed with results and recommendations reviewed with patient.  Pt reported that her speech does bother her at times and she would like to be fully returned to baseline for intelligibility at discharge; although she recognizes that this may not be fully realistic.  SLP and pt developed a 5 point rating scale to facilitate pt's self monitoring and correction of speech performance during structured tasks.  SLP also facilitated  the session with skilled education regarding compensatory intelligibility strategies, emphasizing slow rate, overarticulation, and pausing between words.  Pt initially required min assist verbal cues to achieve intelligibility during a structured verbal reasoning task; however, with the use of the abovementioned rating scale, pt was able to improve her rating of her speech  performance from a 3 out of 5, to a 4 out of 5 with subsequent decrease in cues (supervision assist) needed to achieve intelligibility.  Pt left in bed, handed off to PT.       SLP Assessment  Patient will need skilled [facility] during CIR admission    Recommendations  Recommendations for Other Services: Neuropsych consult Patient destination: Home Follow up Recommendations: Home Health SLP Equipment Recommended: None recommended by SLP    SLP Frequency 3 to 5 out of 7 days   SLP Duration  SLP Intensity  SLP Treatment/Interventions 5-7 days   Minumum of 1-2 x/day, 30 to 90 minutes  Cognitive remediation/compensation;Cueing hierarchy;Internal/external aids;Functional tasks;Patient/family education;Environmental controls    Pain Pain Assessment Pain Assessment: No/denies pain  Prior Functioning Cognitive/Linguistic Baseline: Within functional limits Type of Home: House  Lives With: Alone Available Help at Discharge: Family;Available PRN/intermittently Education: college  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 Needs helper to occlude trach/needs to repeat words.  Social Interaction Social Interaction assist level: Interacts appropriately with others with medication or extra time (anti-anxiety, antidepressant).  Problem Solving Problem solving assist level: Solves basic 90% of the time/requires cueing < 10% of the time  Memory Memory assist level: Recognizes or recalls 75 - 89% of the time/requires cueing 10 - 24% of the time   Short Term Goals: Week 1: SLP Short Term Goal 1 (Week 1): Pt will be intelligible in 80% of opportunities  at the conversational level with supervision cues.  SLP Short Term Goal 2 (Week 1): Pt will recall daily information for 80% accuracy with min assist verbal cues.   Refer to Care Plan for Long Term Goals  Recommendations for other services: None  Discharge Criteria: Patient will be discharged from SLP if patient refuses treatment 3 consecutive times without medical reason, if treatment goals not met, if there is a change in medical status, if patient makes no progress towards goals or if patient is discharged from hospital.  The above assessment, treatment plan, treatment alternatives and goals were discussed and mutually agreed upon: by patient  Emilio Math 12/28/2015, 12:03 PM

## 2015-12-29 ENCOUNTER — Inpatient Hospital Stay (HOSPITAL_COMMUNITY): Payer: Medicaid Other | Admitting: Speech Pathology

## 2015-12-29 ENCOUNTER — Inpatient Hospital Stay (HOSPITAL_COMMUNITY): Payer: Medicaid Other | Admitting: Physical Therapy

## 2015-12-29 ENCOUNTER — Inpatient Hospital Stay (HOSPITAL_COMMUNITY): Payer: Medicaid Other | Admitting: Occupational Therapy

## 2015-12-29 DIAGNOSIS — Z8719 Personal history of other diseases of the digestive system: Secondary | ICD-10-CM

## 2015-12-29 MED ORDER — IBUPROFEN 800 MG PO TABS
800.0000 mg | ORAL_TABLET | Freq: Four times a day (QID) | ORAL | Status: DC | PRN
  Administered 2015-12-29 – 2015-12-31 (×2): 800 mg via ORAL
  Filled 2015-12-29 (×3): qty 1

## 2015-12-29 NOTE — IPOC Note (Signed)
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) ([place])   Tobacco abuse   ETOH abuse   CVA (cerebral infarction)   Hypothyroidism due to acquired atrophy of thyroid   HLD (hyperlipidemia)     Functional Problem List: Nursing    PT Balance, Safety, Behavior, Sensory, Endurance, Motor  OT Balance, Motor, Pain, Safety, Sensory  SLP Cognition, Linguistic  TR         Basic ADL's: OT Grooming, Bathing, Dressing, Toileting     Advanced  ADL's: OT Simple Meal Preparation, Laundry, Light Housekeeping     Transfers: PT Bed Mobility, Bed to Chair, Car, Furniture, Floor  OT Toilet, Tub/Shower     Locomotion: PT Ambulation, Stairs     Additional Impairments: OT Fuctional Use of Upper Extremity  SLP Communication, Social Cognition expression Memory  TR      Anticipated Outcomes Item Anticipated Outcome  Self Feeding Mod I  Swallowing      Basic self-care  Mod I  Toileting  Mod I   Bathroom Transfers Mod I  Bowel/Bladder     Transfers  mod I  Locomotion  mod I ambulation  Communication  Mod I   Cognition  Supervision   Pain     Safety/Judgment      Therapy Plan: PT Intensity: Minimum of 1-2 x/day ,45 to 90 minutes PT Frequency: 5 out of 7 days PT Duration Estimated Length of Stay: 5-7 days OT Intensity: Minimum of 1-2 x/day, 45 to 90 minutes OT Frequency: 5 out of 7 days OT Duration/Estimated Length of Stay: 5-7 days SLP Intensity: Minumum of 1-2 x/day, 30 to 90 minutes SLP Frequency: 3 to 5 out of 7 days SLP Duration/Estimated Length of Stay: 5-7 days        Team Interventions: Nursing Interventions    PT interventions Ambulation/gait training, Training and development officer, Cognitive  remediation/compensation, Community reintegration, Discharge planning, Disease management/prevention, Functional mobility training, Neuromuscular re-education, Patient/family education, Psychosocial support, Stair training, Therapeutic Activities, Therapeutic Exercise, UE/LE Coordination activities, UE/LE Strength taining/ROM  OT Interventions Training and development officer, Discharge planning, Disease mangement/prevention, DME/adaptive equipment instruction, Functional mobility training, Neuromuscular re-education, Pain management, Patient/family education, Psychosocial support, Self Care/advanced ADL retraining, Therapeutic Activities, Therapeutic Exercise, UE/LE Strength taining/ROM, UE/LE Coordination activities  SLP Interventions Cognitive remediation/compensation, Cueing hierarchy, Internal/external aids, Functional tasks, Patient/family education, Environmental controls  TR Interventions    SW/CM Interventions Discharge Planning, Psychosocial Support, Patient/Family Education    Team Discharge Planning: Destination: PT-Home ,OT- Home , SLP-Home Projected Follow-up: PT-None, OT-  Outpatient OT, SLP-Home Health SLP Projected Equipment Needs: PT-None recommended by PT, OT- Tub/shower bench, SLP-None recommended by SLP Equipment Details: PT- , OT-  Patient/family involved in discharge planning: PT- Patient,  OT-Patient, SLP-Patient  MD ELOS: 5-7 days Medical Rehab Prognosis:  Excellent Assessment: The patient has been admitted for CIR therapies with the diagnosis of CVA. The team will be addressing functional mobility, strength, stamina, balance, safety, adaptive techniques and equipment, self-care, bowel and bladder mgt, patient and caregiver education, NMR, community reintegration, ego support. Goals have been set at mod i for mobility and self-care.    Meredith Staggers, MD, FAAPMR      See Team Conference Notes for weekly updates to the plan of care

## 2015-12-29 NOTE — Progress Notes (Signed)
Social Work Patient ID: Theresa Watkins, female   DOB: 09-Feb-1966, 50 y.o.   MRN: [id number] Called back into pt's room due to she really wants to see the dentist before she leaves on Monday. Have asked Dan-PA to contact dentist and see if this is possible. Pt reports she can sign a waiver to not do any x-rays and just have her tow teeth pulled which are giving her such problems.

## 2015-12-29 NOTE — Progress Notes (Signed)
Late entry 1425 : Pt requesting pain medication for tooth discomfort. Tylenol 650 mg offered. Pt refused, stating that Tylenol irritates her stomach. Pt requested an order for Vicodin. Spoke to [employer], [place]. No order for Vicodin for this time. Agreeable to write order for Ibuprofen 800mg . Pt notified of PA's order, pt refused Ibuprofen.

## 2015-12-29 NOTE — Plan of Care (Signed)
Problem: RH PAIN MANAGEMENT Goal: RH STG PAIN MANAGED AT OR BELOW PT'S PAIN GOAL 2/10  Outcome: Progressing No c/o pain     

## 2015-12-29 NOTE — Progress Notes (Signed)
Occupational Therapy Session Note  Patient Details  Name: Theresa Watkins MRN: [id number] Date of Birth: June 17, 1966  Today's Date: 12/29/2015 OT Individual Time: 0845-1000 OT Individual Time Calculation (min): 75 min    Short Term Goals: Week 1:  OT Short Term Goal 1 (Week 1): STG = LTGs due to ELOS  Skilled Therapeutic Interventions/Progress Updates:    ADL retraining with focus on functional mobility, safety with self-care tasks, and functional use of dominant RUE.  Pt completed bathing in standing in room shower with use of grab bars for stability during single leg stance, overall supervision secondary to increase in challenge with standing.  Verbal cues for technique to increase independence with tying shoes, utilizing RUE as gross assist.  Pt demonstrating improved balance throughout self-care tasks in standing and at sit > stand level, even tidying up room with ability to bend down to retrieve items from floor without LOB.  Engaged in [place] with focus on grasp/release and elbow extension, pt requiring increased time and cues for controlled movement.  Pt attempts to overcompensate with speed of movement and use of LUE.  BUE activity with focus on symmetrical movement and graded motor control.  Therapy Documentation Precautions:  Precautions Precautions: Fall Precaution Comments: risk for Rt shoulder subluxation Restrictions Weight Bearing Restrictions: No General:   Vital Signs: Therapy Vitals Temp: 98 F (36.7 C) Temp Source: Oral Pulse Rate: 66 Resp: 17 BP: 114/69 mmHg Patient Position (if appropriate): Lying Oxygen Therapy SpO2: 98 % O2 Device: Not Delivered Pain:  Pt with no c/o pain  See Function Navigator for Current Functional Status.   Therapy/Group: Individual Therapy  Simonne Come 12/29/2015, 9:28 AM

## 2015-12-29 NOTE — Progress Notes (Signed)
Speech Language Pathology Daily Session Note  Patient Details  Name: Theresa Watkins MRN: [id number] Date of Birth: January 24, 1966  Today's Date: 12/29/2015 SLP Individual Time: 1415-1500 SLP Individual Time Calculation (min): 45 min  Short Term Goals: Week 1: SLP Short Term Goal 1 (Week 1): Pt will be intelligible in 80% of opportunities at the conversational level with supervision cues.  SLP Short Term Goal 2 (Week 1): Pt will recall daily information for 80% accuracy with min assist verbal cues.   Skilled Therapeutic Interventions:  Session began late due to pt taking phone call for SSI/disability application.  Pt required mod assist verbal cues for safety due to impulsivity when ambulating to bathroom.  Pt very verbose and tangential, requiring min assist verbal cues for redirection to task.  Pt able to recall at least 2 targeted speech intelligibility strategies from previous ST session.  Pt was intelligible at the conversational level with mod I during a loosely structured word description task.  Pt was left in bed with call bell within reach and bed alarm set at the end of today's session.  Continue per current plan of care.    Function:  Eating Eating                 Cognition Comprehension Comprehension assist level: Understands complex 90% of the time/cues 10% of the time  Expression   Expression assist level: Expresses basic 75 - 89% of the time/requires cueing 10 - 24% of the time. Needs helper to occlude trach/needs to repeat words.  Social Interaction Social Interaction assist level: Interacts appropriately with others with medication or extra time (anti-anxiety, antidepressant).  Problem Solving Problem solving assist level: Solves basic 90% of the time/requires cueing < 10% of the time  Memory Memory assist level: Recognizes or recalls 75 - 89% of the time/requires cueing 10 - 24% of the time    Pain Pain Assessment Pain Assessment: No/denies pain Pain Score: 0-No  pain  Therapy/Group: Individual Therapy  Octavius Shin, Selinda Orion 12/29/2015, 4:05 PM

## 2015-12-29 NOTE — Progress Notes (Signed)
Subjective/Complaints:  Has dental pain lower canine, gives hx of dental abscess treated with abx, has residual pain  ROS- no CP or SOB, some increased sensitivity on Right   Objective: Vital Signs: Blood pressure 114/69, pulse 66, temperature 98 F (36.7 C), temperature source Oral, resp. rate 17, SpO2 98 %. No results found. Results for orders placed or performed during the hospital encounter of 12/27/15 (from the past 72 hour(s))  CBC WITH DIFFERENTIAL     Status: Abnormal   Collection Time: 12/28/15  6:16 AM  Result Value Ref Range   WBC 5.5 4.0 - 10.5 K/uL   RBC 5.21 (H) 3.87 - 5.11 MIL/uL   Hemoglobin 14.9 12.0 - 15.0 g/dL   HCT 44.9 36.0 - 46.0 %   MCV 86.2 78.0 - 100.0 fL   MCH 28.6 26.0 - 34.0 pg   MCHC 33.2 30.0 - 36.0 g/dL   RDW 13.3 11.5 - 15.5 %   Platelets 173 150 - 400 K/uL   Neutrophils Relative % 53 %   Neutro Abs 2.9 1.7 - 7.7 K/uL   Lymphocytes Relative 38 %   Lymphs Abs 2.1 0.7 - 4.0 K/uL   Monocytes Relative 8 %   Monocytes Absolute 0.4 0.1 - 1.0 K/uL   Eosinophils Relative 1 %   Eosinophils Absolute 0.1 0.0 - 0.7 K/uL   Basophils Relative 0 %   Basophils Absolute 0.0 0.0 - 0.1 K/uL  Comprehensive metabolic panel     Status: Abnormal   Collection Time: 12/28/15  6:16 AM  Result Value Ref Range   Sodium 140 135 - 145 mmol/L   Potassium 4.1 3.5 - 5.1 mmol/L   Chloride 106 101 - 111 mmol/L   CO2 24 22 - 32 mmol/L   Glucose, Bld 94 65 - 99 mg/dL   BUN 11 6 - 20 mg/dL   Creatinine, Ser 0.79 0.44 - 1.00 mg/dL   Calcium 8.7 (L) 8.9 - 10.3 mg/dL   Total Protein 6.3 (L) 6.5 - 8.1 g/dL   Albumin 3.4 (L) 3.5 - 5.0 g/dL   AST 15 15 - 41 U/L   ALT 11 (L) 14 - 54 U/L   Alkaline Phosphatase 58 38 - 126 U/L   Total Bilirubin 0.5 0.3 - 1.2 mg/dL   GFR calc non Af Amer >60 >60 mL/min   GFR calc Af Amer >60 >60 mL/min    Comment: (NOTE) The eGFR has been calculated using the CKD EPI equation. This calculation has not been validated in all clinical  situations. eGFR's persistently <60 mL/min signify possible Chronic Kidney Disease.    Anion gap 10 5 - 15      General: No acute distress Mood and affect are appropriate ENT- lower canine eroded to root bilateral, no evidence of abscess Heart: Regular rate and rhythm no rubs murmurs or extra sounds Lungs: Clear to auscultation, breathing unlabored, no rales or wheezes Abdomen: Positive bowel sounds, soft nontender to palpation, nondistended Extremities: No clubbing, cyanosis, or edema Skin: No evidence of breakdown, no evidence of rash Neurologic: Cranial nerves II through XII intact, motor strength is 5/5 in L deltoid, bicep, tricep, grip, hip flexor, knee extensors, ankle dorsiflexor and plantar flexor, 3-/5 on Right  Sensory exam normal sensation to light touch and proprioception in bilateral upper and lower extremities Cerebellar exam normal finger to nose to finger as well as heel to shin in bilateral upper and lower extremities Musculoskeletal: Full range of motion in all 4 extremities. No joint swelling  Assessment/Plan: 1. 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 Physiatrist is providing close team supervision and 24 hour management of active medical problems listed below. Physiatrist and rehab team continue to assess barriers to discharge/monitor patient progress toward functional and medical goals. FIM: Function - Bathing Position: Shower Body parts bathed by patient: Right arm, Left arm, Chest, Abdomen, Front perineal area, Buttocks, Right upper leg, Left upper leg, Right lower leg, Left lower leg, Back Assist Level: Supervision or verbal cues  Function- Upper Body Dressing/Undressing What is the patient wearing?: Pull over shirt/dress Pull over shirt/dress - Perfomed by patient: Thread/unthread right sleeve, Thread/unthread left sleeve, Put head through  opening, Pull shirt over trunk Assist Level: More than reasonable time Function - Lower Body Dressing/Undressing What is the patient wearing?: Underwear, Pants, Shoes Position: Sitting EOB Underwear - Performed by patient: Thread/unthread right underwear leg, Thread/unthread left underwear leg, Pull underwear up/down Pants- Performed by patient: Thread/unthread right pants leg, Thread/unthread left pants leg, Pull pants up/down Shoes - Performed by patient: Don/doff right shoe, Don/doff left shoe Assist for footwear: Setup Assist for lower body dressing: Supervision or verbal cues  Function - Toileting Toileting steps completed by patient: Adjust clothing prior to toileting Assist level: Supervision or verbal cues  Function Midwife transfer assistive device: Grab bar Assist level to toilet: Supervision or verbal cues Assist level from toilet: Supervision or verbal cues  Function - Chair/bed transfer Chair/bed transfer method: Stand pivot, Ambulatory Chair/bed transfer assist level: Supervision or verbal cues Chair/bed transfer assistive device: Armrests Chair/bed transfer details: Verbal cues for precautions/safety  Function - Locomotion: Wheelchair Will patient use wheelchair at discharge?: No (attempted propulsion but difficulty due to height of chair, but pt will be ambulatory) Function - Locomotion: Ambulation Assistive device: No device Max distance: 220 Assist level: Touching or steadying assistance (Pt > 75%) Assist level: Touching or steadying assistance (Pt > 75%) Assist level: Touching or steadying assistance (Pt > 75%) Assist level: Touching or steadying assistance (Pt > 75%) Assist level: Touching or steadying assistance (Pt > 75%)  Function - Comprehension Comprehension: Auditory Comprehension assist level: Understands complex 90% of the time/cues 10% of the time  Function - Expression Expression: Verbal Expression assist level: Expresses  basic 75 - 89% of the time/requires cueing 10 - 24% of the time. Needs helper to occlude trach/needs to repeat words.  Function - Social Interaction Social Interaction assist level: Interacts appropriately with others with medication or extra time (anti-anxiety, antidepressant).  Function - Problem Solving Problem solving assist level: Solves basic 90% of the time/requires cueing < 10% of the time  Function - Memory Memory assist level: Recognizes or recalls 75 - 89% of the time/requires cueing 10 - 24% of the time Patient normally able to recall (first 3 days only): Current season, Location of own room, Staff names and faces, That he or she is in a hospital  Medical Problem List and Plan: 1. Right side weakness secondary to left caudate CVA- severe R facial weakness and dysarthria s well 2. DVT Prophylaxis/Anticoagulation: Subcutaneous Lovenox,last  platelet count 173K, no  signs of bleeding 3. Pain Management: Tylenol as needed 4. Hypothyroidism. Synthroid 5. Neuropsych: This patient is capable of making decisions on her own behalf. 6. Skin/Wound Care: Routine skin checks 7. Fluids/Electrolytes/Nutrition: Routine I&O with follow-up chemistries 8. Tobacco/cocaine abuse. Nicoderm patch. Provide counseling 9. Hyperlipidemia. Lipitor 10.  Cocaine abuse- I discussed  this as a major contributor to her CVA and discussed recurrence risk NS/SW to advise on community resources, although pt denies freq use 11.  Mild hypoalbuminemia- add prostat 12.  Hx of dental abscess- treated with abx will ask Dr Enrique Sack to eval, for root extraction LOS (Days) 2 A FACE TO FACE EVALUATION WAS PERFORMED  KIRSTEINS,ANDREW E 12/29/2015, 7:03 AM

## 2015-12-29 NOTE — Progress Notes (Signed)
Social Work  Patient ID: Theresa Watkins, female   DOB: 11/16/1966, 50 y.o.   MRN: [id number] Team feels pt will reach her goals by Monday after therapies. MD feels she will be ready also. Met with pt to inform of this and she is unsure if she will be ready since she is so tired today After her therapies. Discussed OT & SP feels she needs follow up and maybe a PT evaluate to make sure she is doing well at home. Alos to be set up with [employer] health and [facility] program. She has applied for Medicaid and wants to apply for disability also before she goes home.  Pt is moving well and moving her arm more than yesterday, although she feels she is about the same. She is also concerned About her two teeth giving her problems, MD told her he would contact the dentist and have him come and see here. Will work on discharge needs and prepare for discharge Monday.

## 2015-12-29 NOTE — Progress Notes (Signed)
Physical Therapy Session Note  Patient Details  Name: Theresa Watkins MRN: [id number] Date of Birth: [birth year]  Today's Date: 12/29/2015 PT Individual Time: 1000-1100 PT Individual Time Calculation (min): 60 min   Short Term Goals: Week 1:  PT Short Term Goal 1 (Week 1): =LTG due to estimated length of stay  Skilled Therapeutic Interventions/Progress Updates:    Pt received ambulating in hallway with handoff from OT; no c/o pain and agreeable to treatment. Gait in community x20 min over uneven surfaces, inclines/declines, stairs 3 x 12 with 1 handrail S overall. Utilized rest breaks for education regarding stroke risk, identification of signs/symptoms of another stroke. Dynamic standing balance activities including upside down bosu with ball throws, grapevine, heel/toe walks with min guard/S overall. Returned to room with S, performed hygiene tasks at sink with distant S. Remained supine in bed at completion of session, all needs within reach.   Therapy Documentation Precautions:  Precautions Precautions: Fall Precaution Comments: risk for Rt shoulder subluxation Restrictions Weight Bearing Restrictions: No Pain: Pain Assessment Pain Assessment: No/denies pain Pain Score: 0-No pain   See Function Navigator for Current Functional Status.   Therapy/Group: Individual Therapy  Luberta Mutter 12/29/2015, 12:20 PM

## 2015-12-30 ENCOUNTER — Inpatient Hospital Stay (HOSPITAL_COMMUNITY): Payer: Medicaid Other | Admitting: Speech Pathology

## 2015-12-30 ENCOUNTER — Inpatient Hospital Stay (HOSPITAL_COMMUNITY): Payer: Medicaid Other | Admitting: Physical Therapy

## 2015-12-30 ENCOUNTER — Inpatient Hospital Stay (HOSPITAL_COMMUNITY): Payer: Medicaid Other | Admitting: Occupational Therapy

## 2015-12-30 DIAGNOSIS — K0889 Other specified disorders of teeth and supporting structures: Secondary | ICD-10-CM | POA: Insufficient documentation

## 2015-12-30 MED ORDER — SIMVASTATIN 20 MG PO TABS
20.0000 mg | ORAL_TABLET | Freq: Every day | ORAL | Status: DC
  Administered 2015-12-30 – 2016-01-01 (×3): 20 mg via ORAL
  Filled 2015-12-30 (×3): qty 1

## 2015-12-30 NOTE — Progress Notes (Signed)
Speech Language Pathology Daily Session Note  Patient Details  Name: Theresa Watkins MRN: [id number] Date of Birth: May 09, 1966  Today's Date: 12/30/2015 SLP Individual Time: 1130-1200 SLP Individual Time Calculation (min): 30 min  Short Term Goals: Week 1: SLP Short Term Goal 1 (Week 1): Pt will be intelligible in 80% of opportunities at the conversational level with supervision cues.  SLP Short Term Goal 2 (Week 1): Pt will recall daily information for 80% accuracy with min assist verbal cues.   Skilled Therapeutic Interventions: Skilled treatment session focused on cognitive goals. At beginning of session, patient was in family room attempting to make a cup of hot chocolate independently. Patient demonstrated decreased safety with task with over heating of the cup which caused the liquid to leak out of the bottom into the microwave and floor. Patient required overall Mod A verbal cues for problem solving, attention to RUE and overall safety with cleaning task. SLP also facilitated session with Mod A question cues in regards to anticipatory awareness and ways to incorporate memory strategies into her daily routine to maximize overall safety and independence. Patient was ~90% intelligible throughout functional conversation with supervision verbal cues. Patient left sitting upright in bed with all needs within reach. Continue with current plan of care.    Function:   Cognition Comprehension Comprehension assist level: Understands complex 90% of the time/cues 10% of the time  Expression   Expression assist level: Expresses basic 75 - 89% of the time/requires cueing 10 - 24% of the time. Needs helper to occlude trach/needs to repeat words.  Social Interaction Social Interaction assist level: Interacts appropriately with others with medication or extra time (anti-anxiety, antidepressant).  Problem Solving Problem solving assist level: Solves basic 75 - 89% of the time/requires cueing 10 - 24% of  the time  Memory Memory assist level: Recognizes or recalls 75 - 89% of the time/requires cueing 10 - 24% of the time    Pain No/Denies Pain   Therapy/Group: Individual Therapy  Tyrina Hines 12/30/2015, 1:36 PM

## 2015-12-30 NOTE — Progress Notes (Addendum)
Subjective/Complaints: Patient continues to have good pain. She also noted there is a Pharmacist, community in the house. She also wants her medical records so that she can provided to family member who is in the medical field in [place]. She has questions about her cholesterol is well and the need to take medication.  ROS- +tooth pain. Denies CP, SOB, N/V/D  Objective: Vital Signs: Blood pressure 119/68, pulse 68, temperature 97.8 F (36.6 C), temperature source Oral, resp. rate 20, SpO2 97 %. No results found. Results for orders placed or performed during the hospital encounter of 12/27/15 (from the past 72 hour(s))  CBC WITH DIFFERENTIAL     Status: Abnormal   Collection Time: 12/28/15  6:16 AM  Result Value Ref Range   WBC 5.5 4.0 - 10.5 K/uL   RBC 5.21 (H) 3.87 - 5.11 MIL/uL   Hemoglobin 14.9 12.0 - 15.0 g/dL   HCT 44.9 36.0 - 46.0 %   MCV 86.2 78.0 - 100.0 fL   MCH 28.6 26.0 - 34.0 pg   MCHC 33.2 30.0 - 36.0 g/dL   RDW 13.3 11.5 - 15.5 %   Platelets 173 150 - 400 K/uL   Neutrophils Relative % 53 %   Neutro Abs 2.9 1.7 - 7.7 K/uL   Lymphocytes Relative 38 %   Lymphs Abs 2.1 0.7 - 4.0 K/uL   Monocytes Relative 8 %   Monocytes Absolute 0.4 0.1 - 1.0 K/uL   Eosinophils Relative 1 %   Eosinophils Absolute 0.1 0.0 - 0.7 K/uL   Basophils Relative 0 %   Basophils Absolute 0.0 0.0 - 0.1 K/uL  Comprehensive metabolic panel     Status: Abnormal   Collection Time: 12/28/15  6:16 AM  Result Value Ref Range   Sodium 140 135 - 145 mmol/L   Potassium 4.1 3.5 - 5.1 mmol/L   Chloride 106 101 - 111 mmol/L   CO2 24 22 - 32 mmol/L   Glucose, Bld 94 65 - 99 mg/dL   BUN 11 6 - 20 mg/dL   Creatinine, Ser 0.79 0.44 - 1.00 mg/dL   Calcium 8.7 (L) 8.9 - 10.3 mg/dL   Total Protein 6.3 (L) 6.5 - 8.1 g/dL   Albumin 3.4 (L) 3.5 - 5.0 g/dL   AST 15 15 - 41 U/L   ALT 11 (L) 14 - 54 U/L   Alkaline Phosphatase 58 38 - 126 U/L   Total Bilirubin 0.5 0.3 - 1.2 mg/dL   GFR calc non Af Amer >60 >60 mL/min    GFR calc Af Amer >60 >60 mL/min    Comment: (NOTE) The eGFR has been calculated using the CKD EPI equation. This calculation has not been validated in all clinical situations. eGFR's persistently <60 mL/min signify possible Chronic Kidney Disease.    Anion gap 10 5 - 15    General: No acute distress. Vital signs reviewed  ENT- lower canine eroded Heart: Regular rate and rhythm no rubs murmurs or extra sounds Lungs: Clear to auscultation, breathing unlabored, no rales or wheezes Abdomen: Positive bowel sounds, soft nontender to palpation, nondistended Skin: No evidence of breakdown, no evidence of rash Neurologic:  Dysarthric speech Right facial weakness LUE/LLE: 5/5 proximal distal R UE/RLE: 4-/5 proximal distal Sensory exam normal sensation to light touch Musculoskeletal: No tenderness. No edema Psych: Mood and affect are appropriate  Assessment/Plan: 1. 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 Physiatrist is  providing close team supervision and 24 hour management of active medical problems listed below. Physiatrist and rehab team continue to assess barriers to discharge/monitor patient progress toward functional and medical goals. FIM: Function - Bathing Position: Shower Body parts bathed by patient: Right arm, Left arm, Chest, Abdomen, Front perineal area, Buttocks, Right upper leg, Left upper leg, Right lower leg, Left lower leg, Back Assist Level: Supervision or verbal cues (completed in standing)  Function- Upper Body Dressing/Undressing What is the patient wearing?: Pull over shirt/dress Pull over shirt/dress - Perfomed by patient: Thread/unthread right sleeve, Thread/unthread left sleeve, Put head through opening, Pull shirt over trunk Assist Level: More than reasonable time Function - Lower Body Dressing/Undressing What is the patient wearing?: Underwear,  Pants, Shoes Position: Sitting EOB Underwear - Performed by patient: Thread/unthread right underwear leg, Thread/unthread left underwear leg, Pull underwear up/down Pants- Performed by patient: Thread/unthread right pants leg, Thread/unthread left pants leg, Pull pants up/down Shoes - Performed by patient: Don/doff right shoe, Don/doff left shoe, Fasten right, Fasten left Assist for footwear: Setup Assist for lower body dressing: Supervision or verbal cues  Function - Toileting Toileting steps completed by patient: Adjust clothing prior to toileting, Performs perineal hygiene, Adjust clothing after toileting Assist level: Supervision or verbal cues  Function Midwife transfer assistive device: Grab bar Assist level to toilet: Supervision or verbal cues Assist level from toilet: Supervision or verbal cues  Function - Chair/bed transfer Chair/bed transfer method: Stand pivot, Ambulatory Chair/bed transfer assist level: Supervision or verbal cues Chair/bed transfer assistive device: Armrests Chair/bed transfer details: Verbal cues for precautions/safety  Function - Locomotion: Wheelchair Will patient use wheelchair at discharge?: No (attempted propulsion but difficulty due to height of chair, but pt will be ambulatory) Function - Locomotion: Ambulation Assistive device: No device Max distance: 300 Assist level: Supervision or verbal cues Assist level: Supervision or verbal cues Assist level: Supervision or verbal cues Assist level: Supervision or verbal cues Assist level: Supervision or verbal cues  Function - Comprehension Comprehension: Auditory Comprehension assist level: Understands complex 90% of the time/cues 10% of the time  Function - Expression Expression: Verbal Expression assist level: Expresses basic 75 - 89% of the time/requires cueing 10 - 24% of the time. Needs helper to occlude trach/needs to repeat words.  Function - Social Interaction Social  Interaction assist level: Interacts appropriately with others with medication or extra time (anti-anxiety, antidepressant).  Function - Problem Solving Problem solving assist level: Solves basic 90% of the time/requires cueing < 10% of the time  Function - Memory Memory assist level: Recognizes or recalls 75 - 89% of the time/requires cueing 10 - 24% of the time Patient normally able to recall (first 3 days only): Current season, Location of own room, Staff names and faces, That he or she is in a hospital  Medical Problem List and Plan: 1. Right side weakness secondary to left caudate CVA-   R facial weakness and dysarthria as well 2. DVT Prophylaxis/Anticoagulation: Subcutaneous Lovenox, no  signs of bleeding 3. Pain Management: Tylenol as needed 4. Hypothyroidism. Synthroid 5. Neuropsych: This patient is capable of making decisions on her own behalf. 6. Skin/Wound Care: Routine skin checks 7. Fluids/Electrolytes/Nutrition: Routine I&O  8. Tobacco/cocaine abuse. Nicoderm patch. Provide counseling 9. Hyperlipidemia. Lipitor  Discussed results with patient and need for medication -patient reports "blurred vision" and "has her terrible things" about Lipitor. She requested changed to Zocor. 10.  Cocaine abuse- continue to counsel 11.  Mild hypoalbuminemia- added prostat  12.  Hx of dental abscess- treated with abx will ask Dr Enrique Sack to eval, for root extraction in the future  LOS (Days) 3 A FACE TO FACE EVALUATION WAS PERFORMED  Ankit Lorie Phenix 12/30/2015, 8:20 AM

## 2015-12-30 NOTE — Progress Notes (Signed)
Occupational Therapy Session Note  Patient Details  Name: Theresa Watkins MRN: [id number] Date of Birth: Apr 14, 1966  Today's Date: 12/30/2015 OT Individual Time:  -       Short Term Goals: Week 1:  OT Short Term Goal 1 (Week 1): STG = LTGs due to ELOS  Skilled Therapeutic Interventions/Progress Updates: Patient participated in skilled OT today with functional status as follows:  Patient was able to gather clothing in prep for shower with distant S for ambulation around her room to gather clothing.      She walked with close S without assistive device from/to her room & ADL apartement bathroom; stepped over into the wet tub ledge to take her shower with with close S into the wet shower.          Important to note that after she came out of the tub she stated she felt a little 'swimmy headed' and 'maybe from the steam and moisture in the shower.'  But she appeared to maintain her balance and no losses of balance were noted thereafter.  She sat on the edge of the bed in the ADL apartment and propped up her legs one at a time to don lotion and clothing over her feet.   She was able to go sit to stand to pull up panties and pants with distant S.  She was able to slide her feet into her slip on type shoes at the end of the dressing tasks.     At the end of the session, she was left sitting edge of bed in her room with call bell and phone in place.      Patient exhibited positive affect during the session but did state she felt that she lives alone and needs to stay in the rehab center long enough to become more independent and to regain more use of her right hand.        Therapy Documentation Precautions:  Precautions Precautions: Fall Precaution Comments: risk for Rt shoulder subluxation Restrictions Weight Bearing Restrictions: No Pain:denied    See Function Navigator for Current Functional Status.   Therapy/Group: Individual Therapy  [name] [facility] 12/30/2015, 4:42 PM

## 2015-12-30 NOTE — Progress Notes (Signed)
2/11 1139 nursing Patient refuses bed alarm  and continues to get up by herself; RN and NT reminded patient but non compliant. Therapist notified to clarify per patient her therapist is allowing her to get up by herself. Will follow up. Patient also took off her nicotine patch claims she does not need it since she only smokes occasionally at home and is making her itch. MD also notified re: patient request of changing her lipitor to simvastatin.

## 2015-12-30 NOTE — Progress Notes (Signed)
Physical Therapy Session Note  Patient Details  Name: Theresa Watkins MRN: [id number] Date of Birth: 03-16-1966  Today's Date: 12/30/2015    Skilled Therapeutic Interventions/Progress Updates:  Session I [id number] (60 min )  Session initiated with gait to therapy gym, NuStep x 10 min with multiple cues for continuation-performed in order to facilitate coordinated and reciprocal movements at steady peace.  Wii bowling to increase coordination and dynamic standing balance, patient used R and L arm to throw ball, needs cues for safety and sequencing, decreased focus.  Long distance gait training with slow jog in order to increase coordination, and activity tolerance.  At end of session returned to room, family present all needs within reach.  Patient was easily distracted and needed continues redirection to complete tasks.   Session II 1300-1345 (45 min ) Patient in bed, agrees to therapy session. Focus on a balance training during this session with [employer] training on 'Catch "game ,both on steady and level 11 surface with occasional use of UE for support on bars. 3 x 4 min  Standing arm ergometer 1 x 5 min in order to increase standing tolerance and facilitate R UE movement. Patient needs frequent redirections to stay on task.  Long distance gait training with task of path finding and following route. Patient was able to navigate her way around, used postings to self orient and make it back to midwest unit from 1 st floor.  During session education on energy conservation techniques and on general safety as well as stroke sign recognition.  At the end of session requested to return to bed, alarm set in , all needs within reach.   Therapy Documentation Precautions:  Precautions Precautions: Fall Precaution Comments: risk for Rt shoulder subluxation Restrictions Weight Bearing Restrictions: No  See Function Navigator for Current Functional Status.   Therapy/Group: Individual  Therapy  Guadlupe Spanish 12/30/2015, 10:11 AM

## 2015-12-31 ENCOUNTER — Inpatient Hospital Stay (HOSPITAL_COMMUNITY): Payer: Self-pay | Admitting: Physical Therapy

## 2015-12-31 ENCOUNTER — Inpatient Hospital Stay (HOSPITAL_COMMUNITY): Payer: Medicaid Other

## 2015-12-31 MED ORDER — DICLOFENAC SODIUM 1 % TD GEL
2.0000 g | Freq: Four times a day (QID) | TRANSDERMAL | Status: DC
  Administered 2015-12-31 – 2016-01-02 (×8): 2 g via TOPICAL
  Filled 2015-12-31: qty 100

## 2015-12-31 MED ORDER — FLUCONAZOLE 100 MG PO TABS
100.0000 mg | ORAL_TABLET | Freq: Once | ORAL | Status: AC
  Administered 2015-12-31: 100 mg via ORAL
  Filled 2015-12-31: qty 1

## 2015-12-31 NOTE — Progress Notes (Signed)
12/31/15 1206 nursing Patient complained of vaginal itching claims "like a yeast infection ."  Md notified new orders noted.

## 2015-12-31 NOTE — Progress Notes (Signed)
Occupational Therapy Session Note  Patient Details  Name: Theresa Watkins MRN: [id number] Date of Birth: [birth year]  Today's Date: 12/31/2015 OT Individual Time: [id number] OT Individual Time Calculation (min): 45 min    Short Term Goals: Week 1:  OT Short Term Goal 1 (Week 1): STG = LTGs due to ELOS  Skilled Therapeutic Interventions/Progress Updates:    Pt in bathroom upon arrival.  Pt completed toileting tasks and donned pants and sweat shirt before amb without AD to ADL apartment.  Pt engaged in simple meal prep task including cooking grits in microwave and cleaning up at end of task.  Pt completed all tasks safely and without assistance.  Recommended not using oven secondary to RUE weakness.  Pt stated she usually cooked on stove top. Pt verbalized understanding. Pt educated in AAROM/SROM for RUE to increase range/strength.  Pt also issued yellow theraband and educated on shoulder exercises. Pt returned to room and remained seated EOB.  Therapy Documentation Precautions:  Precautions Precautions: Fall Precaution Comments: risk for Rt shoulder subluxation Restrictions Weight Bearing Restrictions: No   Pain: Pain Assessment Pain Assessment: No/denies pain  See Function Navigator for Current Functional Status.   Therapy/Group: Individual Therapy  Leroy Libman 12/31/2015, 11:52 AM

## 2015-12-31 NOTE — Progress Notes (Signed)
Subjective/Complaints:  Patient seen sitting up in bed this morning. She has questions about her cholesterol medications, which were changes yesterday her to pain, and nodules on her foot.  ROS- +tooth pain. Denies CP, SOB, N/V/D  Objective: Vital Signs: Blood pressure 109/69, pulse 71, temperature 97.4 F (36.3 C), temperature source Oral, resp. rate 20, SpO2 99 %. No results found. No results found for this or any previous visit (from the past 72 hour(s)).  General: No acute distress. Vital signs reviewed  ENT- lower canine eroded Heart: Regular rate and rhythm no rubs murmurs or extra sounds Lungs: Clear to auscultation, breathing unlabored, no rales or wheezes Abdomen: Positive bowel sounds, soft nontender to palpation, nondistended Skin: No evidence of breakdown, no evidence of rash Neurologic:  Dysarthric speech Right facial weakness LUE/LLE: 5/5 proximal distal R UE/RLE: 4/5 proximally, 4 -/5 distally Sensory exam normal sensation to light touch Musculoskeletal: No tenderness. No edema. 2 small palpable nodules plantar asked back of left foot Psych: Mood and affect are appropriate  Assessment/Plan: 1. 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 Physiatrist is providing close team supervision and 24 hour management of active medical problems listed below. Physiatrist and rehab team continue to assess barriers to discharge/monitor patient progress toward functional and medical goals. FIM: Function - Bathing Position: Standing at sink (completed standing  in ADL tub/shower) Body parts bathed by patient: Right arm, Left arm, Chest, Abdomen, Front perineal area, Buttocks, Right upper leg, Left upper leg, Right lower leg, Left lower leg, Back Assist Level: Supervision or verbal cues  Function- Upper Body Dressing/Undressing What is the patient wearing?: Pull over  shirt/dress, Button up shirt Pull over shirt/dress - Perfomed by patient: Thread/unthread right sleeve, Thread/unthread left sleeve, Put head through opening, Pull shirt over trunk Assist Level: More than reasonable time Function - Lower Body Dressing/Undressing What is the patient wearing?: Underwear, Pants, Socks, Shoes Position: Sitting EOB Underwear - Performed by patient: Thread/unthread right underwear leg, Thread/unthread left underwear leg, Pull underwear up/down Pants- Performed by patient: Thread/unthread right pants leg, Thread/unthread left pants leg, Pull pants up/down, Fasten/unfasten pants Socks - Performed by patient: Don/doff right sock, Don/doff left sock Shoes - Performed by patient: Don/doff right shoe, Don/doff left shoe, Fasten right, Fasten left Assist for footwear: Independent Assist for lower body dressing: Supervision or verbal cues  Function - Toileting Toileting activity did not occur: Refused Toileting steps completed by patient: Adjust clothing prior to toileting, Performs perineal hygiene, Adjust clothing after toileting Assist level: Supervision or verbal cues  Function - Air cabin crew transfer activity did not occur: Refused Toilet transfer assistive device: Grab bar Assist level to toilet: Supervision or verbal cues Assist level from toilet: Supervision or verbal cues  Function - Chair/bed transfer Chair/bed transfer method: Ambulatory Chair/bed transfer assist level: Supervision or verbal cues Chair/bed transfer assistive device: Armrests Chair/bed transfer details: Visual cues/gestures for precautions/safety  Function - Locomotion: Wheelchair Will patient use wheelchair at discharge?: No (attempted propulsion but difficulty due to height of chair, but pt will be ambulatory) Function - Locomotion: Ambulation Assistive device: No device Max distance: 400 Assist level: Supervision or verbal cues Assist level: Supervision or verbal  cues Assist level: Supervision or verbal cues Assist level: Supervision or verbal cues Assist level: Supervision or verbal cues  Function - Comprehension Comprehension: Auditory Comprehension assist level: Understands complex 90% of the time/cues 10% of the time  Function -  Expression Expression: Verbal Expression assist level: Expresses basic 75 - 89% of the time/requires cueing 10 - 24% of the time. Needs helper to occlude trach/needs to repeat words.  Function - Social Interaction Social Interaction assist level: Interacts appropriately with others with medication or extra time (anti-anxiety, antidepressant).  Function - Problem Solving Problem solving assist level: Solves basic 75 - 89% of the time/requires cueing 10 - 24% of the time  Function - Memory Memory assist level: Recognizes or recalls 75 - 89% of the time/requires cueing 10 - 24% of the time Patient normally able to recall (first 3 days only): Current season, Location of own room, Staff names and faces, That he or she is in a hospital  Medical Problem List and Plan: 1. Right side weakness secondary to left caudate CVA-   R facial weakness and dysarthria 2. DVT Prophylaxis/Anticoagulation: Subcutaneous Lovenox, no  signs of bleeding 3. Pain Management: Tylenol as needed 4. Hypothyroidism. Synthroid 5. Neuropsych: This patient is capable of making decisions on her own behalf. 6. Skin/Wound Care: Routine skin checks 7. Fluids/Electrolytes/Nutrition: Routine I&O  8. Tobacco/cocaine abuse. Nicoderm patch. Provide counseling 9. Hyperlipidemia.   Discussed results with patient and need for medication -patient reports "blurred vision" and "has her terrible things" about Lipitor. She requested changed to Zocor.  Lipitor changed to simvastatin per patient request 10.  Cocaine abuse- continue to counsel 11.  Mild hypoalbuminemia- added prostat 12.  Hx of dental abscess- treated with abx will ask Dr Enrique Sack to eval, for  root extraction in the future 13. Left plantar foot nodules  She states these started after her stroke  Encourage patient to massage with range of motion  Will also add Voltaren gel  LOS (Days) 4 A FACE TO FACE EVALUATION WAS PERFORMED  Briceida Rasberry Lorie Phenix 12/31/2015, 6:31 AM

## 2015-12-31 NOTE — Progress Notes (Signed)
12/31/15  1316 nursing Patient is non compliant with her bed alarm. Refuses for to be turned on.

## 2016-01-01 ENCOUNTER — Inpatient Hospital Stay (HOSPITAL_COMMUNITY): Payer: Medicaid Other | Admitting: Occupational Therapy

## 2016-01-01 ENCOUNTER — Inpatient Hospital Stay (HOSPITAL_COMMUNITY): Payer: Medicaid Other | Admitting: Physical Therapy

## 2016-01-01 ENCOUNTER — Inpatient Hospital Stay (HOSPITAL_COMMUNITY): Payer: Medicaid Other | Admitting: Speech Pathology

## 2016-01-01 ENCOUNTER — Encounter (HOSPITAL_COMMUNITY): Payer: Self-pay

## 2016-01-01 MED ORDER — SIMVASTATIN 20 MG PO TABS: 20.0000 mg | ORAL_TABLET | Freq: Every day | ORAL | Status: DC

## 2016-01-01 MED ORDER — MICONAZOLE NITRATE 100 MG VA SUPP
100.0000 mg | Freq: Every day | VAGINAL | Status: DC
  Administered 2016-01-01: 100 mg via VAGINAL
  Filled 2016-01-01: qty 7

## 2016-01-01 MED ORDER — HYDROCORTISONE 1 % EX CREA
TOPICAL_CREAM | Freq: Two times a day (BID) | CUTANEOUS | Status: DC
  Filled 2016-01-01: qty 28

## 2016-01-01 MED ORDER — DICLOFENAC SODIUM 1 % TD GEL: 2.0000 g | Freq: Four times a day (QID) | TRANSDERMAL | Status: DC

## 2016-01-01 MED ORDER — ATORVASTATIN CALCIUM 40 MG PO TABS: 40.0000 mg | ORAL_TABLET | Freq: Every day | ORAL | Status: DC

## 2016-01-01 MED ORDER — LEVOTHYROXINE SODIUM 125 MCG PO TABS: 125.0000 ug | ORAL_TABLET | Freq: Every day | ORAL | Status: DC

## 2016-01-01 MED ORDER — NICOTINE 7 MG/24HR TD PT24: MEDICATED_PATCH | TRANSDERMAL | Status: DC

## 2016-01-01 NOTE — Progress Notes (Signed)
Speech Language Pathology Discharge Summary  Patient Details  Name: Theresa Watkins MRN: [id number] Date of Birth: 07/31/1966  Today's Date: 01/01/2016 SLP Individual Time: 1300-1330 SLP Individual Time Calculation (min): 30 min  Skilled Therapeutic Interventions:   Skilled treatment session focused on addressing cognition goals and wrap of of education.  Of note, no family present for session so education was reviewed with patient; however, 24/7 supervision is being recommended due to ongoing cognition deficits.  Upon SLP arrival patient was attempting to pick up pieces of a broken plate with her hands.  SLP facilitated session with Mod question cues to verbally problem solve safe procedures for cleaning up sharp broken pieces of plate and despite cues patient unable to recognize that with her hemiplegia she would require assist from another person to safely clean it up due to inability to utilize a broom and a dust pan.  Educated patient on this being part of the rationale for need for 24/7 supervision.  Patient unable to recall any of the previously discussed memory compensatory strategies, but with increased wait time was able to locate handout and read through it with SLP.  Continue to recommend 24/7 supervision for safety upon discharge and follow up SLP services.    Patient has met 2 of 3 long term goals.  Patient to discharge at overall Supervision;Mod level.  Reasons goals not met: decreased safety awarenss that requires Mod assist    Clinical Impression/Discharge Summary:    Patient has made functional gains during this rehab admission and has met 2 of 3 long term goals due to improved functional abilities.  Patient is currently an overall Supervision assist for recall of new information and Mod assist for safety awareness.  Patient education has been completed, no family present and patient reports that she will discharge home with 24 hour supervision from her mother. Patient would  benefit from follow up SLP services to continue efforts to maximize cognitive skills and to maximize their functional independence as well as safety and further reduce the burden of care.   Care Partner:  Caregiver Able to Provide Assistance: Yes (per patient report )  Type of Caregiver Assistance: Cognitive  Recommendation:  Home Health SLP;24 hour supervision/assistance;Outpatient SLP  Rationale for SLP Follow Up: Maximize functional communication;Maximize cognitive function and independence;Reduce caregiver burden   Equipment: none   Reasons for discharge: Discharged from hospital   Patient/Family Agrees with Progress Made and Goals Achieved: Yes   Function:  Cognition Comprehension Comprehension assist level: Follows complex conversation/direction with extra time/assistive device  Expression   Expression assist level: Expresses complex ideas: With extra time/assistive device  Social Interaction Social Interaction assist level: Interacts appropriately with others with medication or extra time (anti-anxiety, antidepressant).  Problem Solving Problem solving assist level: Solves complex 90% of the time/cues < 10% of the time  Memory Memory assist level: Recognizes or recalls 90% of the time/requires cueing < 10% of the time   Carmelia Roller., CCC-SLP [phone]  Theresa Watkins 01/02/2016, 8:46 AM

## 2016-01-01 NOTE — Progress Notes (Addendum)
Physical Therapy Discharge Summary  Patient Details  Name: Theresa Watkins MRN: [id number] Date of Birth: Apr 03, 1966  Today's Date: 01/01/2016 PT Individual Time: 1000-1100 PT Individual Time Calculation (min): 60 min    Patient has met 12 of 12 long term goals due to improved activity tolerance, improved balance, improved postural control, increased strength, ability to compensate for deficits, functional use of  right upper extremity and right lower extremity and improved awareness.  Patient to discharge at an ambulatory level Modified Independent.   Patient to d/c home at mod I level; no family present to supervise or provide assist. Patient has been educated extensively on home safety, HEP.   Reasons goals not met: All goals met  Recommendation:  Patient will not require ongoing skilled PT services after d/c to home at mod I level. Pt has been educated and given handouts with HEP to continue improving higher level balance recovery and strengthening.   Equipment: No equipment provided  Reasons for discharge: treatment goals met  Patient/family agrees with progress made and goals achieved: Yes  PT Discharge Precautions/Restrictions Precautions Precautions: Fall Restrictions Weight Bearing Restrictions: No Pain Pain Assessment Pain Assessment: No/denies pain Pain Score: 0-No pain Vision/Perception    WFL; no reported change from baseline. Perception WFL. Cognition Overall Cognitive Status: Impaired/Different from baseline Arousal/Alertness: Awake/alert Orientation Level: Oriented X4 Attention: Selective Selective Attention: Appears intact Memory: Impaired Memory Impairment: Decreased recall of new information Awareness: Appears intact Problem Solving: Appears intact Safety/Judgment: Appears intact Sensation Sensation Light Touch: Impaired Detail Light Touch Impaired Details: Impaired RUE;Impaired RLE Proprioception: Impaired Detail Proprioception Impaired  Details: Impaired RUE Coordination Gross Motor Movements are Fluid and Coordinated: Yes Heel Shin Test: WFL BLE Motor  Motor Motor - Discharge Observations: mild R hemiparesis UE>LE  Mobility Bed Mobility Bed Mobility: Supine to Sit;Sit to Supine Supine to Sit: 6: Modified independent (Device/Increase time) Sit to Supine: 6: Modified independent (Device/Increase time) Transfers Transfers: Yes Stand Pivot Transfers: 6: Modified independent (Device/Increase time) Locomotion  Ambulation Ambulation: Yes Ambulation/Gait Assistance: 6: Modified independent (Device/Increase time) Ambulation Distance (Feet): 400 Feet Assistive device: None Gait Gait: Yes Gait Pattern: Impaired Gait Pattern: Lateral hip instability High Level Ambulation High Level Ambulation: Direction changes;Sudden stops;Head turns;Toe walking;Heel walking;Side stepping;Backwards walking Side Stepping: x20' mod I Backwards Walking: x20' mod I Direction Changes: mod I Sudden Stops: mod I Head Turns: mod I Toe Walking: x15' mod I Heel Walking: x15' mod I Stairs / Additional Locomotion Stairs: Yes Stairs Assistance: 6: Modified independent (Device/Increase time) Stair Management Technique: One rail Left;Alternating pattern;Forwards Number of Stairs: 24 Height of Stairs: 6 Ramp: 6: Modified independent (Device) Curb: 6: Modified independent (Device/increase time) Wheelchair Mobility Wheelchair Mobility: No  Trunk/Postural Assessment  Cervical Assessment Cervical Assessment: Within Functional Limits Thoracic Assessment Thoracic Assessment: Within Functional Limits Lumbar Assessment Lumbar Assessment: Within Functional Limits Postural Control Postural Control: Within Functional Limits  Balance Balance Balance Assessed: Yes Standardized Balance Assessment Standardized Balance Assessment: Berg Balance Test Berg Balance Test Sit to Stand: Able to stand without using hands and stabilize  independently Standing Unsupported: Able to stand safely 2 minutes Sitting with Back Unsupported but Feet Supported on Floor or Stool: Able to sit safely and securely 2 minutes Stand to Sit: Sits safely with minimal use of hands Transfers: Able to transfer safely, minor use of hands Standing Unsupported with Eyes Closed: Able to stand 10 seconds safely Standing Ubsupported with Feet Together: Able to place feet together independently and stand 1 minute safely From Standing, Reach  Forward with Outstretched Arm: Can reach confidently >25 cm (10") From Standing Position, Pick up Object from Floor: Able to pick up shoe safely and easily From Standing Position, Turn to Look Behind Over each Shoulder: Looks behind from both sides and weight shifts well Turn 360 Degrees: Able to turn 360 degrees safely in 4 seconds or less Standing Unsupported, Alternately Place Feet on Step/Stool: Able to stand independently and safely and complete 8 steps in 20 seconds Standing Unsupported, One Foot in Front: Able to place foot tandem independently and hold 30 seconds Standing on One Leg: Able to lift leg independently and hold > 10 seconds Total Score: 56 Dynamic Gait Index Level Surface: Normal Change in Gait Speed: Normal Gait with Horizontal Head Turns: Normal Gait with Vertical Head Turns: Normal Gait and Pivot Turn: Normal Step Over Obstacle: Normal Step Around Obstacles: Normal Steps: Normal Total Score: 24 Dynamic Sitting Balance Dynamic Sitting - Balance Support: Feet supported;During functional activity Dynamic Sitting - Level of Assistance: 6: Modified independent (Device/Increase time) Dynamic Sitting - Balance Activities: Lateral lean/weight shifting;Forward lean/weight shifting Static Standing Balance Static Standing - Balance Support: No upper extremity supported;During functional activity Static Standing - Level of Assistance: 6: Modified independent (Device/Increase time) Static Stance:  Eyes Closed: x30 sec in Berg, mod I Dynamic Standing Balance Dynamic Standing - Balance Support: No upper extremity supported;During functional activity Dynamic Standing - Level of Assistance: 6: Modified independent (Device/Increase time) Dynamic Standing - Balance Activities: Forward lean/weight shifting;Lateral lean/weight shifting;Reaching for objects Extremity Assessment  RUE Assessment RUE Assessment: Exceptions to [facility] LUE Assessment LUE Assessment: Within Functional Limits RLE Assessment RLE Assessment: Within Functional Limits LLE Assessment LLE Assessment: Within Functional Limits   Skilled Therapeutic Intervention: Pt received seated on edge of bed with mother present; no c/o pain and agreeable to treatment. Pt and mother very concerned about pt being discharged today, state they do not believe she is ready to go and the house is not ready for her to come home yet. Educated pt and mother in pt's progress, goals set at mod I level and team believing that she will meet goals today to be prepared to go home. Discussed pt at too high of a level to qualify for SNF, and that pt has performed stairs several times before the weekend, but will attempt again to assess performance. Assessed all mobility as described above, in addition to car and floor transfer with mod I. Instructed pt in LE strengthening/balance HEP [place] and C with pt able to demonstrate all activities on handout with mod I level. However, instructed pt verbally and in writing on HEP to perform balance exercises only when someone is home with her. Pt made mod I in her room, sign put on door and RN alerted. Per PA Dan during session, pt told she was allowed to ambulate on hall of rehab unit. Clarified with pt and RN and instructed pt that she is allowed to ambulate on hall if she stops at nursing station to be escorted by staff to/from day room; pt in agreement. Pt returned to room and remained seated on EOB with all needs in  reach at completion of session.   See Function Navigator for Current Functional Status.  Benjiman Core Tygielski 01/01/2016, 5:49 PM

## 2016-01-01 NOTE — Plan of Care (Signed)
Problem: RH SAFETY Goal: RH STG ADHERE TO SAFETY PRECAUTIONS W/ASSISTANCE/DEVICE STG Adhere to Safety Precautions With Assistance/Device. Mod I  Outcome: Not Progressing Non compliant with bed alarm

## 2016-01-01 NOTE — Progress Notes (Addendum)
Subjective/Complaints:  Pt is paying for a  Handrail to be installed in her home, she states she has no furniture on her 1st floor and has 20 steps to get to bedroom.  THinks this will be installed today or tomorrow  ROS- +tooth pain. Denies CP, SOB, N/V/D  Objective: Vital Signs: Blood pressure 110/61, pulse 74, temperature 98.1 F (36.7 C), temperature source Oral, resp. rate 18, SpO2 100 %. No results found. No results found for this or any previous visit (from the past 72 hour(s)).  General: No acute distress. Vital signs reviewed  ENT- lower canine eroded Heart: Regular rate and rhythm no rubs murmurs or extra sounds Lungs: Clear to auscultation, breathing unlabored, no rales or wheezes Abdomen: Positive bowel sounds, soft nontender to palpation, nondistended Skin: No evidence of breakdown, no evidence of rash Neurologic:  Dysarthric speech Right facial weakness LUE/LLE: 5/5 proximal distal R UE/: 4/5 proximally, 4 -/5 distally RLE 5/5 Sensory exam normal sensation to light touch Musculoskeletal: No tenderness. No edema. 2 small palpable nodules plantar asked back of left foot Psych: Mood and affect are appropriate  Assessment/Plan: 1. 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 Physiatrist is providing close team supervision and 24 hour management of active medical problems listed below. Physiatrist and rehab team continue to assess barriers to discharge/monitor patient progress toward functional and medical goals.  Will need to clarify home accessibility issues with PT and SW prior to d/c may need to be delayed for a day FIM: Function - Bathing Position: Standing at sink (completed standing  in ADL tub/shower) Body parts bathed by patient: Right arm, Left arm, Chest, Abdomen, Front perineal area, Buttocks, Right upper leg, Left upper leg, Right lower leg, Left  lower leg, Back Assist Level: Supervision or verbal cues  Function- Upper Body Dressing/Undressing What is the patient wearing?: Pull over shirt/dress, Button up shirt Pull over shirt/dress - Perfomed by patient: Thread/unthread right sleeve, Thread/unthread left sleeve, Put head through opening, Pull shirt over trunk Assist Level: More than reasonable time Function - Lower Body Dressing/Undressing What is the patient wearing?: Underwear, Pants, Socks, Shoes Position: Sitting EOB Underwear - Performed by patient: Thread/unthread right underwear leg, Thread/unthread left underwear leg, Pull underwear up/down Pants- Performed by patient: Thread/unthread right pants leg, Thread/unthread left pants leg, Pull pants up/down, Fasten/unfasten pants Socks - Performed by patient: Don/doff right sock, Don/doff left sock Shoes - Performed by patient: Don/doff right shoe, Don/doff left shoe, Fasten right, Fasten left Assist for footwear: Independent Assist for lower body dressing: Supervision or verbal cues  Function - Toileting Toileting activity did not occur: Refused Toileting steps completed by patient: Adjust clothing prior to toileting, Performs perineal hygiene, Adjust clothing after toileting Assist level: Supervision or verbal cues  Function - Air cabin crew transfer activity did not occur: Refused Toilet transfer assistive device: Grab bar Assist level to toilet: Supervision or verbal cues Assist level from toilet: Supervision or verbal cues  Function - Chair/bed transfer Chair/bed transfer method: Ambulatory Chair/bed transfer assist level: Supervision or verbal cues Chair/bed transfer assistive device: Armrests Chair/bed transfer details: Visual cues/gestures for precautions/safety  Function - Locomotion: Wheelchair Will patient use wheelchair at discharge?: No (attempted propulsion but difficulty due to height of chair, but pt will be ambulatory) Function - Locomotion:  Ambulation Assistive device: No device Max distance: 400 Assist level: Supervision or verbal cues Assist level: Supervision or verbal cues Assist  level: Supervision or verbal cues Assist level: Supervision or verbal cues Assist level: Supervision or verbal cues  Function - Comprehension Comprehension: Auditory Comprehension assist level: Follows complex conversation/direction with extra time/assistive device  Function - Expression Expression: Verbal Expression assist level: Expresses complex 90% of the time/cues < 10% of the time  Function - Social Interaction Social Interaction assist level: Interacts appropriately with others - No medications needed.  Function - Problem Solving Problem solving assist level: Solves basic problems with no assist  Function - Memory Memory assist level: Recognizes or recalls 90% of the time/requires cueing < 10% of the time Patient normally able to recall (first 3 days only): Current season, Location of own room, Staff names and faces, That he or she is in a hospital  Medical Problem List and Plan: 1. 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 DVT Prophylaxis/Anticoagulation: Subcutaneous Lovenox, no  signs of bleeding 3. Pain Management: Tylenol as needed 4. Hypothyroidism. Synthroid 5. Neuropsych: This patient is capable of making decisions on her own behalf. 6. Skin/Wound Care: Routine skin checks 7. Fluids/Electrolytes/Nutrition: Routine I&O  8. Tobacco/cocaine abuse. Nicoderm patch. Provide counseling 9. Hyperlipidemia.  She requested changed to Zocor.  Lipitor changed to simvastatin per patient request 10.  Cocaine abuse- continue to counsel 11.  Mild hypoalbuminemia- added prostat 12.  Hx of dental abscess- treated with abx will ask Dr Enrique Sack to eval, for root extraction in the future, this does not need to be done prior to admission 13. Left plantar foot nodules-  OA   LOS (Days) 5 A FACE TO FACE EVALUATION WAS PERFORMED  KIRSTEINS,ANDREW E 01/01/2016, 7:07 AM

## 2016-01-01 NOTE — Progress Notes (Signed)
Social Work Patient ID: Theresa Watkins, female   DOB: [birth year], 50 y.o.   MRN: [id number] Spoke with Dan-PA and team who feels they can justify staying until tomorrow, teams plans to make mod/i in her room today and will discharge her from therapies. Have met with pt and spoke with Mom via telephone to inform of team and MD plan. Mom asked why not have her stay longer-informed her she has met the team's goals and she is medically stable. Mom states: " She had a stroke." Discussed she would not be eligible to go to a NH either, due to too high level. Both understood and Mom will try to get pt's home ready unsure what needs to be done for pt to return home. Pt reports she is not sure what she and sister are doing at her home. Work toward discharge tomorrow, pt and Mom aware this will not change tomorrow.

## 2016-01-01 NOTE — Discharge Summary (Signed)
Discharge summary job [phone]

## 2016-01-01 NOTE — Progress Notes (Signed)
Occupational Therapy Discharge Summary  Patient Details  Name: Theresa Watkins MRN: [id number] Date of Birth: [birth year]  Patient has met 58 of 68 long term goals due to improved activity tolerance, improved balance, postural control, ability to compensate for deficits, functional use of  RIGHT upper and RIGHT lower extremity and improved awareness.  Patient to discharge at overall Modified Independent level.  Patient's care partner is independent to provide the necessary intermittent assistance at discharge.    Reasons goals not met: N/A  Recommendation:  Patient will benefit from ongoing skilled OT services in home health setting to continue to advance functional skills in the area of BADL, iADL and Reduce care partner burden.  Equipment: 3 in 1  Reasons for discharge: treatment goals met and discharge from hospital  Patient/family agrees with progress made and goals achieved: Yes  OT Discharge Precautions/Restrictions  Precautions Precautions: Fall Restrictions Weight Bearing Restrictions: No Pain Pain Assessment Pain Assessment: No/denies pain Pain Score: 0-No pain ADL  See Function Navigator Vision/Perception  Vision- History Baseline Vision/History: Wears glasses Wears Glasses: Reading only Patient Visual Report: No change from baseline  Cognition Overall Cognitive Status: Impaired/Different from baseline Arousal/Alertness: Awake/alert Orientation Level: Oriented X4 Attention: Selective Selective Attention: Appears intact Memory: Impaired Memory Impairment: Decreased recall of new information Awareness: Appears intact Problem Solving: Appears intact Safety/Judgment: Appears intact Sensation Sensation Light Touch: Impaired Detail Light Touch Impaired Details: Impaired RUE;Impaired RLE Proprioception: Impaired Detail Proprioception Impaired Details: Impaired RUE Coordination Gross Motor Movements are Fluid and Coordinated: Yes Fine Motor Movements are  Fluid and Coordinated: No Heel Shin Test: WFL BLE 9 Hole Peg Test: Unable to assess  as pt unable to complete thumb opposition Motor  Motor Motor - Discharge Observations: mild R hemiparesis UE>LE Mobility  Bed Mobility Bed Mobility: Supine to Sit;Sit to Supine Supine to Sit: 6: Modified independent (Device/Increase time) Sit to Supine: 6: Modified independent (Device/Increase time)  Trunk/Postural Assessment  Cervical Assessment Cervical Assessment: Within Functional Limits Thoracic Assessment Thoracic Assessment: Within Functional Limits Lumbar Assessment Lumbar Assessment: Within Functional Limits Postural Control Postural Control: Within Functional Limits  Balance Balance Balance Assessed: Yes Standardized Balance Assessment Standardized Balance Assessment: Berg Balance Test Berg Balance Test Sit to Stand: Able to stand without using hands and stabilize independently Standing Unsupported: Able to stand safely 2 minutes Sitting with Back Unsupported but Feet Supported on Floor or Stool: Able to sit safely and securely 2 minutes Stand to Sit: Sits safely with minimal use of hands Transfers: Able to transfer safely, minor use of hands Standing Unsupported with Eyes Closed: Able to stand 10 seconds safely Standing Ubsupported with Feet Together: Able to place feet together independently and stand 1 minute safely From Standing, Reach Forward with Outstretched Arm: Can reach confidently >25 cm (10") From Standing Position, Pick up Object from Floor: Able to pick up shoe safely and easily From Standing Position, Turn to Look Behind Over each Shoulder: Looks behind from both sides and weight shifts well Turn 360 Degrees: Able to turn 360 degrees safely in 4 seconds or less Standing Unsupported, Alternately Place Feet on Step/Stool: Able to stand independently and safely and complete 8 steps in 20 seconds Standing Unsupported, One Foot in Front: Able to place foot tandem  independently and hold 30 seconds Standing on One Leg: Able to lift leg independently and hold > 10 seconds Total Score: 56 Dynamic Gait Index Level Surface: Normal Change in Gait Speed: Normal Gait with Horizontal Head Turns: Normal Gait with Vertical Head  Turns: Normal Gait and Pivot Turn: Normal Step Over Obstacle: Normal Step Around Obstacles: Normal Steps: Normal Total Score: 24 Dynamic Sitting Balance Dynamic Sitting - Balance Support: Feet supported;During functional activity Dynamic Sitting - Level of Assistance: 6: Modified independent (Device/Increase time) Dynamic Sitting - Balance Activities: Lateral lean/weight shifting;Forward lean/weight shifting Static Standing Balance Static Standing - Balance Support: No upper extremity supported;During functional activity Static Standing - Level of Assistance: 6: Modified independent (Device/Increase time) Static Stance: Eyes Closed: x30 sec in Berg, mod I Dynamic Standing Balance Dynamic Standing - Balance Support: No upper extremity supported;During functional activity Dynamic Standing - Level of Assistance: 6: Modified independent (Device/Increase time) Dynamic Standing - Balance Activities: Forward lean/weight shifting;Lateral lean/weight shifting;Reaching for objects Extremity/Trunk Assessment RUE Assessment RUE Assessment: Exceptions to [facility] (shoulder flexion to approx 80 degrees without compensatory strategies, strength grossly 3/5) LUE Assessment LUE Assessment: Within Functional Limits   See Function Navigator for Current Functional Status.  Simonne Come 01/01/2016, 4:04 PM

## 2016-01-01 NOTE — Progress Notes (Signed)
Social Work Patient ID: Theresa Watkins, female   DOB: 1966-07-16, 50 y.o.   MRN: [id number] Met with mother who feels there is no way pt can go home today due to home is not ready and the bedroom and bathroom in on the second floor. Discussed she is ambulating up stairs and Will double check with the PT regarding her ability to go up stairs. Will order a bedside commode so pt can place this on the first floor for urgency if needed. Mom reports her arm is still weak, informed her pt would be getting home health therapies upon discharge from the hospital. Made Mom aware the discharge date is up the MD and he would make the call.

## 2016-01-01 NOTE — Progress Notes (Signed)
Occupational Therapy Session Note  Patient Details  Name: Theresa Watkins MRN: [id number] Date of Birth: [birth year]  Today's Date: 01/01/2016 OT Individual Time: 1100-1205 OT Individual Time Calculation (min): 65 min    Short Term Goals: Week 1:  OT Short Term Goal 1 (Week 1): STG = LTGs due to ELOS  Skilled Therapeutic Interventions/Progress Updates:    Completed ADL retraining at overall Modified Independent - Independent level in ADL tub room.  Pt gathered all clothing and items prior to session (as had been made Mod I in room) and ambulated to ADL tub room.  Pt completed toileting and bathing in ADL tub room without assistance and no safety concerns.  Pt completed UB dressing in standing and dressed at sit > stand level from EOB for LB dressing.  Engaged in simple housekeeping tasks with picking items off floor and making the bed with pt able to complete at Mod I level.  Pt continues to have decreased strength and ROM in RUE, provided pt with fine motor control HEP and discussed various other activities with theraband and theraputty.  Pt reports moderate discomfort in Rt shoulder with shoulder flexion, question subluxation therefore applied kinesiotape to Rt shoulder to ensure proper positioning and ease pt's concerns.  Therapy Documentation Precautions:  Precautions Precautions: Fall Precaution Comments: risk for Rt shoulder subluxation Restrictions Weight Bearing Restrictions: No Pain: Pain Assessment Pain Assessment: No/denies pain Pain Score: 0-No pain  See Function Navigator for Current Functional Status.   Therapy/Group: Individual Therapy  Simonne Come 01/01/2016, 3:12 PM

## 2016-01-01 NOTE — Discharge Instructions (Signed)
Inpatient Rehab Discharge Instructions  Theresa Watkins Discharge date and time: No discharge date for patient encounter.   Activities/Precautions/ Functional Status: Activity: activity as tolerated Diet: regular diet Wound Care: none needed Functional status:  ___ No restrictions     ___ Walk up steps independently ___ 24/7 supervision/assistance   ___ Walk up steps with assistance ___ Intermittent supervision/assistance  ___ Bathe/dress independently ___ Walk with walker     _x__ Bathe/dress with assistance ___ Walk Independently    ___ Shower independently _x__ Walk with assistance    ___ Shower with assistance ___ No alcohol     ___ Return to work/school ________  Special Instructions: No driving or smoking   COMMUNITY REFERRALS UPON DISCHARGE:    Home Health:   PT, OT,SP, SW  Agency:[facility] [place]   Date of last service:01/01/2016  Medical Equipment/Items Ordered:BEDSIDE COMMODE  Agency/Supplier:[facility]    [phone] [facility] SET UP 2/17 @ 3:00 PM DECLINED SUBSTANCE ABUSE SERVICES  GENERAL COMMUNITY RESOURCES FOR PATIENT/FAMILY: Support Groups:[facility] SUPPORT GROUP SECOND THURSDAY OF [place] @ 3:00-4:00 PM QUESTIONS ASK Joellen [place] [phone] Employment Assistance:[facility] [facility]  [id number]   STROKE/TIA DISCHARGE INSTRUCTIONS SMOKING Cigarette smoking nearly doubles your risk of having a stroke & is the single most alterable risk factor  If you smoke or have smoked in the last 12 months, you are advised to quit smoking for your health.  Most of the excess cardiovascular risk related to smoking disappears within a year of stopping.  Ask you doctor about anti-smoking medications  [place] Quit Line: 1-800-QUIT NOW  Free Smoking Cessation Classes [phone]  CHOLESTEROL Know your levels; limit fat & cholesterol in your diet  Lipid Panel     Component Value Date/Time   CHOL 228* 12/26/2015 0408   TRIG 157* 12/26/2015 0408   HDL 62 12/26/2015 0408   CHOLHDL 3.7 12/26/2015 0408   VLDL 31 12/26/2015 0408   LDLCALC 135* 12/26/2015 0408      Many patients benefit from treatment even if their cholesterol is at goal.  Goal: Total Cholesterol (CHOL) less than 160  Goal:  Triglycerides (TRIG) less than 150  Goal:  HDL greater than 40  Goal:  LDL (LDLCALC) less than 100   BLOOD PRESSURE American Stroke Association blood pressure target is less that 120/80 mm/Hg  Your discharge blood pressure is:  BP: 120/71 mmHg  Monitor your blood pressure  Limit your salt and alcohol intake  Many individuals will require more than one medication for high blood pressure  DIABETES (A1c is a blood sugar average for last 3 months) Goal HGBA1c is under 7% (HBGA1c is blood sugar average for last 3 months)  Diabetes: No known diagnosis of diabetes    Lab Results  Component Value Date   HGBA1C 5.8* 12/26/2015     Your HGBA1c can be lowered with medications, healthy diet, and exercise.  Check your blood sugar as directed by your physician  Call your physician if you experience unexplained or low blood sugars.  PHYSICAL ACTIVITY/REHABILITATION Goal is 30 minutes at least 4 days per week  Activity: Increase activity slowly, Therapies: Physical Therapy: Home Health Return to work:   Activity decreases your risk of heart attack and stroke and makes your heart stronger.  It helps control your weight and blood pressure; helps you relax and can improve your mood.  Participate in a regular exercise program.  Talk with your doctor about the best form of exercise for you (  dancing, walking, swimming, cycling).  DIET/WEIGHT Goal is to maintain a healthy weight  Your discharge diet is: Diet Heart Room service appropriate?: Yes; Fluid consistency:: Thin  liquids Your height is:    Your current weight is:   Your Body Mass Index (BMI) is:     Following the type of diet specifically designed for you will  help prevent another stroke.  Your goal weight range is:    Your goal Body Mass Index (BMI) is 19-24.  Healthy food habits can help reduce 3 risk factors for stroke:  High cholesterol, hypertension, and excess weight.  RESOURCES Stroke/Support Group:  Call [phone]   STROKE EDUCATION PROVIDED/REVIEWED AND GIVEN TO PATIENT Stroke warning signs and symptoms How to activate emergency medical system (call 911). Medications prescribed at discharge. Need for follow-up after discharge. Personal risk factors for stroke. Pneumonia vaccine given:  Flu vaccine given:  My questions have been answered, the writing is legible, and I understand these instructions.  I will adhere to these goals & educational materials that have been provided to me after my discharge from the hospital.       My questions have been answered and I understand these instructions. I will adhere to these goals and the provided educational materials after my discharge from the hospital.  Patient/Caregiver Signature _______________________________ Date __________  Clinician Signature _______________________________________ Date __________  Please bring this form and your medication list with you to all your follow-up doctor's appointments.

## 2016-01-02 MED ORDER — MICONAZOLE NITRATE 100 MG VA SUPP: 100.0000 mg | Freq: Every day | VAGINAL | Status: DC

## 2016-01-02 NOTE — Discharge Summary (Signed)
NAMEABIGAILROSE, Theresa Watkins NO.:  [id number]  MEDICAL RECORD NO.:  [place]:[id number]  LOCATION:  4M02C                        FACILITY:  [facility]  PHYSICIAN:  Charlett Blake, M.D.DATE OF BIRTH:  [birth year]  DATE OF ADMISSION:  12/27/2015 DATE OF DISCHARGE:  01/02/2016                              DISCHARGE SUMMARY   DISCHARGE DIAGNOSES: 1. 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 2. Subcutaneous Lovenox for DVT prophylaxis. 3. Hypothyroidism. 4. Tobacco, cocaine abuse. 5. Hyperlipidemia. 6. History of dental caries.  HISTORY OF PRESENT ILLNESS:  This is a 50 year old right-handed female with history of hypothyroidism, tobacco, polysubstance abuse.  Per review of chart, the patient lives alone, independent prior to admission.  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  She presented on December 24, 2015, with a fall and right-sided weakness.  Urine drug screen positive for cocaine.  CT of the head showed a 3-4 mm hyperdense focus in the left caudate head and anterior body that could represent possible small vessel caudate infarct with microhemorrhage.  Echocardiogram with ejection fraction of 60%.  No wall motion abnormalities, and a type 1-R atrial septal aneurysm felt to possibly be a small PFO, left to right side flow by Doppler.  Carotid Dopplers with no ICA stenosis.  The patient did not receive tPA.  Neurology consulted, maintained on aspirin for CVA prophylaxis.  Subcutaneous Lovenox for DVT prophylaxis.  Therapies initiated, noted fatigue and lethargy, limited ability to participate.  The patient was admitted for a comprehensive rehab program.  PAST MEDICAL HISTORY:  See discharge diagnoses.  SOCIAL HISTORY:  Lives alone.  Independent prior to admission. Mother to assist on discharge  FUNCTIONAL STATUS:  Upon admission to rehab services was moderate assist, ambulates 35 feet, one-person handheld assistance; minimal assist, stand  pivot transfers; min-mod assist, activities of daily living.  PHYSICAL EXAMINATION:  VITAL SIGNS:  Blood pressure 137/73, pulse 79, temperature 98, respirations 18. GENERAL:  This was an alert female, in no acute distress.  Oriented to person, place, and time. LUNGS:  Clear to auscultation without wheeze. CARDIAC:  Regular rate and rhythm. ABDOMEN:  Soft, nontender.  Good bowel sounds. MOUTH:  Noted poor dentition.  REHABILITATION HOSPITAL COURSE:  The patient was admitted to inpatient rehab services with therapies initiated on a 3-hour daily basis consisting of physical therapy, occupational therapy, speech therapy, and rehabilitation nursing.  The following issues were addressed during the patient's rehabilitation stay; pertaining to Ms. Aron's right- sided weakness secondary to left CVA remained stable.  She had some mild dysarthria, fully intelligible.  She remained on aspirin for CVA prophylaxis, would follow up with Neurology Services.  Hypothyroidism with Synthroid as directed.  Noted history of tobacco, cocaine abuse with positive urine drug screen.  Maintained on a NicoDerm patch.  She received full counseling in regard to cessation of any illicit drug products as well as alcohol and tobacco.  It was questionable if she would be compliant with these requests. Noted poor dentition.  She did have some mild tooth pain, remained afebrile.  Discussions were held for outpatient dental service. Inpatient dental service will only able to see the patient in case of emergency at  this time.  Left plantar foot nodules.  Placed on Voltaren gel with good results.  The patient received weekly collaborative interdisciplinary team conferences to discuss estimated length of stay, family teaching, and any barriers to discharge.  The patient did need frequent redirection at times to stay on task but improved greatly throughout her rehabilitation course.  Long distance ambulation with path  finding and following routes.  Work with energy conservation techniques.  She could gather belongings for activities of daily living and homemaking at supervision level.  Sat at the edge of the bed for dressing and grooming. She could navigate stairs multiple levels at supervision level. At time of discharge patient was modified independent level.  Full family teaching was completed and plan discharge to home with ongoing therapies dictated per rehab services.  DISCHARGE MEDICATIONS:  Included: 1. Aspirin 325 mg p.o. daily. 2. Voltaren gel 4 times daily to affected area. 3. Advil 800 mg every 6 hours as needed for mild pain. 4. Synthroid 125 mcg p.o. daily. 5. NicoDerm patch, taper as directed. 6. Zocor 20 mg p.o. daily.  DIET:  Her diet was regular.  FOLLOWUP:  The patient would follow up with Dr. Alysia Penna at the outpatient rehab center as directed; Dr. Leonie Man, Neurology Service in 1 month, call for appointment; [facility] on January 05, 2016, Medical Management.  The patient was advised no drinking of alcohol or illicit drug products or smoking.  No driving.     Lauraine Rinne, P.A.   ______________________________ Charlett Blake, M.D.    DA/MEDQ  D:  01/01/2016  T:  01/01/2016  Job:  [id number]  cc:   Pramod P. Leonie Man, [place] and [place]

## 2016-01-02 NOTE — Progress Notes (Signed)
Subjective/Complaints:  Mod I in room- walking forward and backward in PT yesterday  Discussed foot issues, plantar fasciitis Apologizing for being rude to staff yest (cursed out PA and Psych)  ROS- -tooth pain. Denies CP, SOB, N/V/D  Objective: Vital Signs: Blood pressure 121/78, pulse 63, temperature 98 F (36.7 C), temperature source Oral, resp. rate 16, SpO2 97 %. No results found. No results found for this or any previous visit (from the past 72 hour(s)).  General: No acute distress. Vital signs reviewed  ENT- lower canine eroded Heart: Regular rate and rhythm no rubs murmurs or extra sounds Lungs: Clear to auscultation, breathing unlabored, no rales or wheezes Abdomen: Positive bowel sounds, soft nontender to palpation, nondistended Skin: No evidence of breakdown, no evidence of rash Neurologic:  Dysarthric speech Right facial weakness LUE/LLE: 5/5 proximal distal R UE/: 4/5 proximally, 4 -/5 distally RLE 5/5 Sensory exam normal sensation to light touch Musculoskeletal: No tenderness. No edema. 2 small palpable nodules plantar asked back of left foot Psych: Mood and affect are appropriate  Assessment/Plan: 1. Functional deficits secondary to Left caudate CVA  Stable for D/C today F/u PCP in 1-2 weeks F/u PM&R 3 weeks See D/C summary See D/C instructions FIM: Function - Bathing Position: Shower (standing in ADL tub/shower) Body parts bathed by patient: Right arm, Left arm, Chest, Abdomen, Front perineal area, Buttocks, Right upper leg, Left upper leg, Right lower leg, Left lower leg, Back Assist Level: Assistive device Assistive Device Comment: grab bar  Function- Upper Body Dressing/Undressing What is the patient wearing?: Pull over shirt/dress, Bra Bra - Perfomed by patient: Thread/unthread right bra strap, Thread/unthread left bra strap, Hook/unhook bra (pull down sports bra) Pull over shirt/dress - Perfomed by patient: Thread/unthread right sleeve,  Thread/unthread left sleeve, Put head through opening, Pull shirt over trunk Assist Level: More than reasonable time Function - Lower Body Dressing/Undressing What is the patient wearing?: Underwear, Pants, Socks, Shoes Position: Sitting EOB Underwear - Performed by patient: Thread/unthread right underwear leg, Thread/unthread left underwear leg, Pull underwear up/down Pants- Performed by patient: Thread/unthread right pants leg, Thread/unthread left pants leg, Pull pants up/down, Fasten/unfasten pants Socks - Performed by patient: Don/doff right sock, Don/doff left sock Shoes - Performed by patient: Don/doff right shoe, Don/doff left shoe, Fasten right, Fasten left Assist for footwear: Independent Assist for lower body dressing: More than reasonable time  Function - Toileting Toileting activity did not occur: Refused Toileting steps completed by patient: Adjust clothing prior to toileting, Performs perineal hygiene, Adjust clothing after toileting Assist level: More than reasonable time  Function - Air cabin crew transfer activity did not occur: Refused Toilet transfer assistive device: Grab bar Assist level to toilet: No Help, No cues Assist level from toilet: No Help, No cues  Function - Chair/bed transfer Chair/bed transfer method: Ambulatory Chair/bed transfer assist level: No Help, no cues, assistive device, takes more than a reasonable amount of time Chair/bed transfer assistive device: Armrests Chair/bed transfer details: Visual cues/gestures for precautions/safety  Function - Locomotion: Wheelchair Will patient use wheelchair at discharge?: No Function - Locomotion: Ambulation Assistive device: No device Max distance: 400 Assist level: No help, No cues, assistive device, takes more than a reasonable amount of time Assist level: No help, No cues, assistive device, takes more than a reasonable amount of time Assist level: No help, No cues, assistive device, takes  more than a reasonable amount of time Assist level: No help, No cues, assistive device, takes more than a reasonable amount of  time Assist level: No help, No cues, assistive device, takes more than a reasonable amount of time  Function - Comprehension Comprehension: Auditory Comprehension assist level: Follows complex conversation/direction with no assist  Function - Expression Expression: Verbal Expression assist level: Expresses complex ideas: With extra time/assistive device  Function - Social Interaction Social Interaction assist level: Interacts appropriately with others - No medications needed.  Function - Problem Solving Problem solving assist level: Solves complex 90% of the time/cues < 10% of the time  Function - Memory Memory assist level: Recognizes or recalls 90% of the time/requires cueing < 10% of the time Patient normally able to recall (first 3 days only): Current season  Medical Problem List and Plan: 1. Right side weakness secondary to left caudate CVA-   R facial weakness and dysarthria, RUE monoparesis, discussed need for compliance with HEP, and abstaining from cocaine use 2. DVT Prophylaxis/Anticoagulation: Subcutaneous Lovenox, no  signs of bleeding 3. Pain Management: Tylenol as needed 4. Hypothyroidism. Synthroid 5. Neuropsych: This patient is capable of making decisions on her own behalf. 6. Skin/Wound Care: Routine skin checks 7. Fluids/Electrolytes/Nutrition: Routine I&O  8. Tobacco/cocaine abuse. Nicoderm patch. Provide counseling 9. Hyperlipidemia.  She requested changed to Zocor.  Lipitor changed to simvastatin per patient request 10.  Cocaine abuse- continue to counsel 11.  Mild hypoalbuminemia- added prostat 12.  Hx of dental abscess- treated with abx has private denture, for root extraction in the future, this does not need to be done prior to admission, will get partials as outpt 13. Left plantar foot nodules-calluses +/- plantar  fasciitis-discussed ice massage   LOS (Days) 6 A FACE TO FACE EVALUATION WAS PERFORMED  KIRSTEINS,ANDREW E 01/02/2016, 7:12 AM

## 2016-01-02 NOTE — Progress Notes (Signed)
Pt discharged home with mother. Discharge instructions provided by Silvestre Mesi, PA. All questions answered. Pt verbalized understanding. Pt ambulated independently off unit w/[place] NT assisting with personal belongings.

## 2016-01-02 NOTE — Consult Note (Signed)
NEUROBEHAVIORAL STATUS EXAM - [facility]   MEDICAL NECESSITY: Theresa Watkins was seen on the [facility] for a neurobehavioral status exam to assist in treatment planning during admission.  Records indicate that Theresa Watkins is a "50 y.o. right handed female with history of hypothyroidism, tobacco and polysubstance abuse. Per chart review patient lives alone. 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 She presented 12/24/2015 with fall and right sided weakness. Urine drug screen positive for cocaine. CT of the head showed a 3-4 millimeter hyperdense focus in the left caudate head and anterior body that could represent possible small vessel caudate infarct with microhemorrhage. Echocardiogram with ejection fraction of 60% no wall motion abnormalities and a type1R atrial septal aneurysm felt to be possibly small PFO with left-to-right flow by Doppler. Carotid Dopplers in no ICA stenosis. Patient did not receive TPA. Neurology consulted placed on aspirin for CVA prophylaxis. Subcutaneous Lovenox for DVT prophylaxis."  During today's visit, Theresa Watkins was accompanied by her mother who assisted with the history. Patient reported experiencing post-stroke right facial droop, memory loss, slowed processing speed, slurred speech and word finding difficulty, and right upper and lower extremity issues. Patient admitted to having a migraine for several days prior to the stroke and she had also recreationally used cocaine. She denied any history of illicit substance use.   From an emotional standpoint, Theresa Watkins reported a history of depression and anxiety going back many years. She was treated in the past but had adverse side effects from the medication (name unknown) so she does not take anything now. She was followed by a psychiatrist for medication management and counseling. Post-stroke, she suffers  increased irritability and she admitted to a history of anger issues. She has been irritable and mildly depressed given her present situation. She has also been significantly concerned that her home is not ready for her to discharge today. Suicidal/homicidal ideation, plan or intent was denied. No manic or hypomanic episodes were reported. The patient denied ever experiencing any auditory/visual hallucinations. No major behavioral or personality changes were endorsed.   Patient feels that progress is being made in therapy. No barriers to therapy identified. She feels that the rehab team had displayed poor communication at times but she has generally been satisfied with her stay with the exception of the discharge plan. She lives alone and her family is in the process of making her home discharge ready.   OF NOTE: When patient found out that she was set to leave today and that there was no medical necessity for her stay she became quite furious, starting yelling and cursing, and asked [place] to leave her room. I was able to calm her down and attempted to reason with her and explain the reason for her discharge. She eventually calmed down and it was decided that her rehab team would meet after the patient's next therapy to determine the next best step.   PROCEDURES: [2 units 96116] Diagnostic clinical interview  Review of available records  MENTAL STATUS EXAM: APPEARANCE:  right facial droop, otherwise normal GEN:  Alert and oriented MOOD:  Labile       AFFECT:  Appropriate  SPEECH:  Tangential, interruptive     THOUGHT CONTENT:  Appropriate HALLUCINATIONS:  None INTELLIGENCE:  Average  INSIGHT:  Limited JUDGMENT:  Fair SUICIDAL IDEATION:  Denies SI   HOMICIDAL IDEATION:   Denies HI   IMPRESSION: Theresa Watkins endorsed suffering significant cognitive  and affective symptoms post-stroke. She became quite agitated today given the plan to discharge earlier than she expected. In fact, she was fairly  irate. We were able to calm her down and the treatment team planned to meet to decide the best course of action based off her next scheduled therapy. The patient's main goal is to forgo discharging until her house was deemed safe for her return (per her standards). Patient appeared to have limited insight into the goals for rehab and the need for medical necessity for her to stay.   Overall, I recommend (upon discharge) treatment for mood symptoms with a psychiatrist as well as counseling for behavioral dysregulation. I also recommend that she undergo comprehensive neuropsychological evaluation to better ascertain her cognitive strengths and weaknesses as well as to assess for interval change over time.      Rutha Bouchard, Psy.D.  Clinical Neuropsychologist

## 2016-01-02 NOTE — Progress Notes (Signed)
Social Work  Discharge Note  The overall goal for the admission was met for:   Discharge location: [place] MOM AND SISTER  Length of Stay: Yes-6 DAYS  Discharge activity level: Yes-MOD/I-INDEPENDENT LEVEL  Home/community participation: Yes  Services provided included: MD, RD, PT, OT, SLP, RN, CM, TR, Pharmacy, Neuropsych and SW  Financial Services: Other: PENDING MEDICAID  Follow-up services arranged: Home Health: [facility]-PT,OT,SP,SW, DME: [facility] and Patient/Family has no preference for HH/DME agencies  Comments (or additional information):MOM WAS HERE YESTERDAY AND SAW HOW WELL PT WAS DOING WANTED AN EXTRA DAY TO PREPARE PT'S HOME FOR HER HOMECOMING-MD GRANTED THIS. [facility] APPOINTMENT MADE FOR 2/17 @ 3:00 PM, HOPEFULLY PT WILL GO TO THIS APPOINTMENT. MEDICAID APPLIED FOR, DISABILITY FINANCIAL COUNSELOR-ADAM WORKING ON. PT DECLINED SUBSTANCE ABUSE RESOURCES WANTS TO DEAL WITH ON HER OWN. VOC REHAB NUMBER GIVEN TO PT TO FOLLOW UP WITH. MATCH GIVEN TO PT FOR ASSISTANCE WITH MEDICATIONS UNTIL CAN GET ORANGE CARD.  Patient/Family verbalized understanding of follow-up arrangements: Yes  Individual responsible for coordination of the follow-up plan: SELF & DANETTA-MOM  Confirmed correct DME delivered: Elease Hashimoto 01/02/2016    Elease Hashimoto

## 2016-01-02 NOTE — Progress Notes (Signed)
Social Work Patient ID: Theresa Watkins, female   DOB: [birth year], 50 y.o.   MRN: [id number] Martin Majestic over the Match program and [employer] and Wellness program and appointments with pt. She feels ready to go home and is waiting for her Mom to come to transport her home. Her Mom and sister have gotten rid of some of the furniture in pt's townhome which has made it roomier and more easily able to move in. She does not plan to get the nicotine patch filled due to does not feel like smoking now.  She feels it causes her a skin rash. She is aware [facility] to contact her to begin home health therapies. Made aware [facility] may assist with her tooth issues. Set for discharge today.

## 2016-01-02 NOTE — Plan of Care (Signed)
Problem: RH Awareness Goal: LTG: Patient will demonstrate intellectual/emergent (SLP) LTG: Patient will demonstrate intellectual/emergent/anticipatory awareness with assist during a cognitive/linguistic activity (SLP)  Outcome: Not Applicable Date Met:  01/02/16 Patient continues to demonstrate decreased safety awareness requiring Mod assist.      

## 2016-01-03 NOTE — Patient Care Conference (Signed)
Inpatient RehabilitationTeam Conference and Plan of Care Update Date: 01/02/2016   Time: 11:59 AM    Patient Name: Theresa Watkins      Medical Record Number: [id number]  Date of Birth: 11-06-1966 Sex: Female         Room/Bed: 4M02C/4M02C-01 Payor Info: Payor: MEDICAID PENDING / Plan: MEDICAID PENDING / Product Type: *No Product type* /    Admitting Diagnosis: CVA  Admit Date/Time:  12/27/2015  4:29 PM Admission Comments: No comment available   Primary Diagnosis:  <principal problem not specified> Principal Problem: <principal problem not specified>  Patient Active Problem List   Diagnosis Date Noted  . Tooth pain   . CVA (cerebral infarction) 12/27/2015  . Hypothyroidism due to acquired atrophy of thyroid   . HLD (hyperlipidemia)   . Hemiparesis affecting dominant side as late effect of stroke ([place])   . 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 Tobacco abuse   . Cocaine abuse   . ETOH abuse   . Hypothyroidism (acquired) 12/24/2015  . Acute ischemic stroke ([place]) 12/24/2015  . Stroke ([place]) 12/24/2015  . Drug abuse, cocaine type 05/09/2014    Expected Discharge Date: Expected Discharge Date: 01/02/16  Team Members Present: Physician leading conference: Dr. Alysia Penna Social Worker Present: Ovidio Kin, LCSW Nurse Present: Dorien Chihuahua, RN PT Present: Kem Parkinson, PT OT Present: Simonne Come, OT SLP Present: Windell Moulding, SLP PPS Coordinator present : Daiva Nakayama, RN, CRRN     Current Status/Progress Goal Weekly Team Focus  Medical     medically stable   medically stable-BP controlled-need to go to dentist for teeth     Bowel/Bladder     Cont B & B   cont B & B     Swallow/Nutrition/ Hydration     regular diet   regular diet     ADL's   Mod I overall  Mod I overall  preparation for d/c, education, RUE NMR   Mobility   mod I transfers, ambulation, stairs  mod I transfers, ambulation, stairs  preparation for d/c, HEP, education    Communication   Mod I for intelligibility  na mod I   goals met, pt discharged home with recommendations for ST follow up at next level of care    Safety/Cognition/ Behavioral Observations  Supervision for intelligibiltiy   Supervision   goals met, pt discharge home with recommendations for ST follow up at next level of care    Pain        tooth pain-tylenol every 6 hr when needed     Skin     na           *See Care Plan and progress notes for long and short-term goals.  Barriers to Discharge:   compliance with medications and substance abuse issues   Possible Resolutions to Barriers:    see PCP and stop substances   Discharge Planning/Teaching Needs:    Home alone with intermittent checks from Mom and sister. Doing very well and should reach mod/i level     Team Discussion:  Pt reached goals-mod/i level very quickly and ready to go home. Counseled on substance abuse issues/services-which she declined. Applied for Medicaid wants to apply for disability-will do on own. No medical issues ready for DC. Set up with PCP for follow up with medications and medical follow up  Revisions to Treatment Plan:  Short LOS ready for DC today      Elease Hashimoto 01/03/2016, 11:59 AM

## 2016-01-05 ENCOUNTER — Other Ambulatory Visit: Payer: Self-pay

## 2016-01-05 ENCOUNTER — Ambulatory Visit: Payer: Medicaid Other | Attending: Physician Assistant | Admitting: Physician Assistant

## 2016-01-05 VITALS — BP 132/84 | HR 74 | Temp 98.0°F | Resp 18 | Ht 65.0 in | Wt 166.0 lb

## 2016-01-05 DIAGNOSIS — R29810 Facial weakness: Secondary | ICD-10-CM | POA: Insufficient documentation

## 2016-01-05 DIAGNOSIS — Z888 Allergy status to other drugs, medicaments and biological substances status: Secondary | ICD-10-CM | POA: Diagnosis not present

## 2016-01-05 DIAGNOSIS — F14920 Cocaine use, unspecified with intoxication, uncomplicated: Secondary | ICD-10-CM | POA: Insufficient documentation

## 2016-01-05 DIAGNOSIS — Z0001 Encounter for general adult medical examination with abnormal findings: Secondary | ICD-10-CM | POA: Insufficient documentation

## 2016-01-05 DIAGNOSIS — Z79899 Other long term (current) drug therapy: Secondary | ICD-10-CM | POA: Diagnosis not present

## 2016-01-05 DIAGNOSIS — Z85850 Personal history of malignant neoplasm of thyroid: Secondary | ICD-10-CM | POA: Insufficient documentation

## 2016-01-05 DIAGNOSIS — I6330 Cerebral infarction due to thrombosis of unspecified cerebral artery: Secondary | ICD-10-CM | POA: Diagnosis not present

## 2016-01-05 DIAGNOSIS — Q211 Atrial septal defect: Secondary | ICD-10-CM | POA: Diagnosis not present

## 2016-01-05 DIAGNOSIS — Z7982 Long term (current) use of aspirin: Secondary | ICD-10-CM | POA: Insufficient documentation

## 2016-01-05 DIAGNOSIS — Z9889 Other specified postprocedural states: Secondary | ICD-10-CM | POA: Insufficient documentation

## 2016-01-05 DIAGNOSIS — I10 Essential (primary) hypertension: Secondary | ICD-10-CM | POA: Diagnosis not present

## 2016-01-05 DIAGNOSIS — Z8673 Personal history of transient ischemic attack (TIA), and cerebral infarction without residual deficits: Secondary | ICD-10-CM | POA: Insufficient documentation

## 2016-01-05 DIAGNOSIS — F17210 Nicotine dependence, cigarettes, uncomplicated: Secondary | ICD-10-CM | POA: Insufficient documentation

## 2016-01-05 NOTE — Progress Notes (Signed)
Patient's here for ED f/up for stroke. Patient states she's still having post HA's in right temporal area rated 9/10. Described as throbbing, pressure with constant pain.  Patient also c/o of pain in left arm that hasn't dissipated post stroke/ discharge from hospital rated 10/10, described as constant sharp pain.  Patient requesting rx of aspirin,  pain medication, nicoderm patch.  Patient had A1c done in hospital. Patient declines flu shot.

## 2016-01-08 ENCOUNTER — Telehealth: Payer: Self-pay

## 2016-01-08 MED ORDER — ACETAMINOPHEN-CODEINE #3 300-30 MG PO TABS: 1.0000 | ORAL_TABLET | ORAL | Status: DC | PRN

## 2016-01-08 MED ORDER — NICOTINE 7 MG/24HR TD PT24: MEDICATED_PATCH | TRANSDERMAL | Status: DC

## 2016-01-08 MED FILL — ACETAMINOPHEN/COD #3 TABLET: 300-30 | 5 days supply | Qty: 30 | Fill #0

## 2016-01-08 NOTE — Progress Notes (Addendum)
Theresa Watkins  [id number]  [id number]  DOB - December 17, 1965  Chief Complaint  Patient presents with  . Follow-up  . Cerebrovascular Accident       Subjective:   Theresa Watkins is a 50 y.o. female here today for establishment of care post hospital discharge. She has a PMH of thyroid cancer status post thyroidectomy with resultant hypothyroidism, tobacco and substance/cocaine abuse, HTN, prior stroke, and was admitted to [facility] on 12/24/15 with complaints of right-sided weakness and facial droop. She apparently used some cocaine on the morning of admission and noticed the symptoms later that evening. She had some falls PTA. She was admitted and completed full stroke evaluation. Patient unable to tolerate MRI/MRA. CT head initially was unrevealing. However repeat CT head on 12/25/15 showed question 3-4 millimeter hyperdense focus in the left caudate head/anterior body that could represent a small vessel caudate infarction with microhemorrhage. She did not receive TPA. In the absence of MRI, CT angiogram of the head was preferred but patient was allergic to dye and hence noncontrasted CT was repeated. Carotid Dopplers: No significant stenosis. 2-D echo: No source of clot, likely small PFO. LDL 135 and newly started on statins. Hemoglobin A1c: 5.8. Patient was on aspirin 81 MG daily prior to admission which has now been increased to 325 MG daily for secondary stroke prevention. She also remained on Lovenox DVT dose through her rehab stay.   She transitioned to inpatient rehab from 12/27/15-01/01/16. She is now home. She has been compliant with her medications. She is rarely smoking. She is rarely consuming alcohol and has stayed away from illicit drugs. She has an upcoming appt with neurology. She denies HAs. Her speech is continuing to improve. The right sided weakness/deficits are continuing to improve. She is not driving.    ROS: GEN: denies fever or chills, denies change in weight Skin: denies  lesions or rashes HEENT: denies headache, earache, epistaxis, sore throat, or neck pain LUNGS: denies SHOB, dyspnea, PND, orthopnea CV: denies CP or palpitations EXT: denies muscle spasms or swelling; no pain in lower ext, no weakness NEURO: denies numbness or tingling, denies sz, +stroke or TIA  ALLERGIES: Allergies  Allergen Reactions  . Iohexol Hives, Itching and Other (See Comments)     Code: HIVES, Desc: pts tongue began itching post injection and throat burning, Onset Date: [id number]   . Percocet [Oxycodone-Acetaminophen] Rash    PAST MEDICAL HISTORY: Past Medical History  Diagnosis Date  . Headache(784.0)   . Seizures ([place])   . Thyroid cancer ([place]) 20011  . Bronchitis   . Urinary tract infection   . Ovarian cyst   . Fibroid   . Abnormal Pap smear   . Trichomonas     PAST SURGICAL HISTORY: Past Surgical History  Procedure Laterality Date  . Abdominal hysterectomy    . Laparoscopic  1988    removal  of ectopic preg, ruptured tube  . Therapeutic abortion    . Cesarean section    . Thyroidectomy  march 2011    cancer  . Goiter      removed    MEDICATIONS AT HOME: Prior to Admission medications   Medication Sig Start Date End Date Taking? Authorizing Provider  aspirin EC 325 MG EC tablet Take 1 tablet (325 mg total) by mouth daily. 12/27/15  Yes Modena Jansky, MD  Calcium Carbonate-Vitamin D (CALCIUM + D PO) Take 2 tablets by mouth 2 (two) times daily.    Yes Historical Provider, MD  diclofenac sodium (VOLTAREN)  1 % GEL Apply 2 g topically 4 (four) times daily. 01/01/16  Yes Daniel J Angiulli, PA-C  levothyroxine (SYNTHROID, LEVOTHROID) 125 MCG tablet Take 1 tablet (125 mcg total) by mouth daily before breakfast. 01/01/16  Yes Daniel J Angiulli, PA-C  miconazole (MICOTIN) 100 MG vaginal suppository Place 1 suppository (100 mg total) vaginally at bedtime. 01/02/16  Yes Daniel J Angiulli, PA-C  nicotine (NICODERM CQ - DOSED IN MG/24 HR) 7 mg/24hr patch 7 mg patch  daily 3 weeks and stopped 01/01/16  Yes Daniel J Angiulli, PA-C  simvastatin (ZOCOR) 20 MG tablet Take 1 tablet (20 mg total) by mouth daily at 6 PM. 01/01/16  Yes Lavon Paganini Angiulli, PA-C     Objective:   Filed Vitals:   01/05/16 1521  BP: 132/84  Pulse: 74  Temp: 98 F (36.7 C)  TempSrc: Oral  Resp: 18  Height: 5\' 5"  (1.651 m)  Weight: 166 lb (75.297 kg)  SpO2: 98%    Exam General appearance : Awake, alert, not in any distress. Speech Clear. Not toxic looking Neck: supple, no JVD. No cervical lymphadenopathy.  Chest:Good air entry bilaterally, no added sounds  CVS: S1 S2 regular, no murmurs.  Abdomen: Bowel sounds present, Non tender and not distended with no gaurding, rigidity or rebound. Extremities: B/L Lower Ext shows no edema, both legs are warm to touch Neurology: Awake alert, and oriented X 3, CN II-XII intact, Non focal   Data Review Lab Results  Component Value Date   HGBA1C 5.8* 12/26/2015   HGBA1C  06/14/2010    5.6 (NOTE)                                                                       According to the ADA Clinical Practice Recommendations for 2011, when HbA1c is used as a screening test:   >=6.5%   Diagnostic of Diabetes Mellitus           (if abnormal result  is confirmed)  5.7-6.4%   Increased risk of developing Diabetes Mellitus  References:Diagnosis and Classification of Diabetes Mellitus,Diabetes [id number] 1):S62-S69 and Standards of Medical Care in         Diabetes - 2011,Diabetes Care,2011,34  (Suppl 1):S11-S61.     Assessment & Plan  1. 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 Smoking  -cessation discussed   -has nicotine patches  3. Cocaine use  -cessation discussed 4. HTN  -DASH diet  -Cont to monitor off antihypertensives for now 5. HL  -Cont Zocor 6. Small PFO  -ASA, outpt CARDS follow up at some point    No med changes. Return in about 3  weeks (around 01/26/2016).  The patient was given clear instructions to go to ER or return to medical center if symptoms don't improve, worsen or new problems develop. The patient verbalized understanding. The patient was told to call to get lab results if they haven't heard anything in the next week.   This note has been created with Surveyor, quantity. Any transcriptional errors are unintentional.    Zettie Pho, PA-C [facility], [place]  01/08/2016, 8:56 AM

## 2016-01-08 NOTE — Telephone Encounter (Addendum)
CMA called patient, patient verified name and DOB. Patient was informed about Tylenol #3 sent to [facility]. Patient was requesting script for nicotine patch, aspirin chewable 325mg , which can be purchased over the OTC. Patient verbalized she understood, with no further questions.

## 2016-01-08 NOTE — Addendum Note (Signed)
Addended byZettie Pho S on: 01/08/2016 10:30 AM   Modules accepted: Orders

## 2016-01-09 ENCOUNTER — Telehealth: Payer: Self-pay | Admitting: Physician Assistant

## 2016-01-09 NOTE — Telephone Encounter (Signed)
Ebony Hail from [facility] called stating that pt. Blood pressure is 122/99, and pt. Has been having headaches. Please f/u

## 2016-01-10 ENCOUNTER — Telehealth: Payer: Self-pay | Admitting: *Deleted

## 2016-01-10 NOTE — Telephone Encounter (Signed)
Alonna Minium, Speech Pathologist, [facility] called asking for verbal orders for one more visit.  I called back and gave verbal order per office protocol

## 2016-01-11 ENCOUNTER — Telehealth: Payer: Self-pay

## 2016-01-11 NOTE — Telephone Encounter (Signed)
Diane Laney Pastor from [facility]- is requesting verbal orders 2wk3 for OT.

## 2016-01-11 NOTE — Telephone Encounter (Signed)
Verbal orders approved.

## 2016-01-12 NOTE — Telephone Encounter (Signed)
CMA called patient, patient did not answer. A message was left for the patient to return my asap.

## 2016-01-12 NOTE — Telephone Encounter (Signed)
CMA return call from [facility] from [facility]. She wasn't available to take my call. She's out of office Thursday, 23rd and Friday, 24th. Left a message for her to return my call.

## 2016-01-15 DIAGNOSIS — M6281 Muscle weakness (generalized): Secondary | ICD-10-CM | POA: Diagnosis not present

## 2016-01-15 DIAGNOSIS — R269 Unspecified abnormalities of gait and mobility: Secondary | ICD-10-CM | POA: Diagnosis not present

## 2016-01-15 DIAGNOSIS — I69322 Dysarthria following cerebral infarction: Secondary | ICD-10-CM | POA: Diagnosis not present

## 2016-01-15 DIAGNOSIS — I69398 Other sequelae of cerebral infarction: Secondary | ICD-10-CM | POA: Diagnosis not present

## 2016-01-29 ENCOUNTER — Telehealth: Payer: Self-pay

## 2016-01-29 NOTE — Telephone Encounter (Signed)
Diane Yelton-OT with [facility]- states that the pt missed her last OT visit. She missed 3 out of 6 visits, so she was discharged. Diane may be contacted at [phone].

## 2016-02-12 ENCOUNTER — Encounter: Payer: Medicaid Other | Attending: Physical Medicine & Rehabilitation

## 2016-02-12 ENCOUNTER — Encounter: Payer: Self-pay | Admitting: Physical Medicine & Rehabilitation

## 2016-02-12 ENCOUNTER — Ambulatory Visit (HOSPITAL_BASED_OUTPATIENT_CLINIC_OR_DEPARTMENT_OTHER): Payer: Medicaid Other | Admitting: Physical Medicine & Rehabilitation

## 2016-02-12 VITALS — BP 153/94 | HR 72 | Resp 14

## 2016-02-12 DIAGNOSIS — R569 Unspecified convulsions: Secondary | ICD-10-CM | POA: Diagnosis not present

## 2016-02-12 DIAGNOSIS — F17210 Nicotine dependence, cigarettes, uncomplicated: Secondary | ICD-10-CM | POA: Diagnosis not present

## 2016-02-12 DIAGNOSIS — G8191 Hemiplegia, unspecified affecting right dominant side: Secondary | ICD-10-CM | POA: Insufficient documentation

## 2016-02-12 DIAGNOSIS — I69359 Hemiplegia and hemiparesis following cerebral infarction affecting unspecified side: Secondary | ICD-10-CM

## 2016-02-12 DIAGNOSIS — M25519 Pain in unspecified shoulder: Secondary | ICD-10-CM | POA: Diagnosis not present

## 2016-02-12 DIAGNOSIS — IMO0002 Reserved for concepts with insufficient information to code with codable children: Secondary | ICD-10-CM

## 2016-02-12 DIAGNOSIS — I639 Cerebral infarction, unspecified: Secondary | ICD-10-CM | POA: Insufficient documentation

## 2016-02-12 DIAGNOSIS — R471 Dysarthria and anarthria: Secondary | ICD-10-CM

## 2016-02-12 DIAGNOSIS — Z85850 Personal history of malignant neoplasm of thyroid: Secondary | ICD-10-CM | POA: Diagnosis not present

## 2016-02-12 DIAGNOSIS — Z9889 Other specified postprocedural states: Secondary | ICD-10-CM | POA: Diagnosis not present

## 2016-02-12 NOTE — Progress Notes (Signed)
Subjective:    Patient ID: Theresa Watkins, female    DOB: 16-Sep-1966, 50 y.o.   MRN: [id number] 50 year old right-handed female with history of hypothyroidism, tobacco, polysubstance abuse.  Per review of chart, the patient lives alone, independent prior to admission.  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  She presented on December 24, 2015, with a fall and right-sided weakness.  Urine drug screen positive for cocaine.  CT of the head showed a 3-4 mm hyperdense focus in the left caudate head and anterior body that could represent possible small  vessel caudate infarct with microhemorrhage Echocardiogram with ejection fraction of 60%.  No wall motion abnormalities, and a type 1-R atrial septal aneurysm felt to possibly be a small PFO, left to right side flow by Doppler.  Carotid Dopplers with no ICA stenosis.  The patient did not receive tPA.  Neurology consulted, maintained on aspirin for CVA prophylaxis. DATE OF ADMISSION:  12/27/2015 DATE OF DISCHARGE:  01/02/2016  HPI Return to home lives independently, home health therapy PT OT and speech. Patient is no longer using any type of assistive device for ambulation. She is able to bathe and dress herself. She is able to do some light housework. She has not been a look go back to work yet. She is not driving due to restrictions currently.   Pain Inventory Average Pain 7 Pain Right Now NA My pain is constant and aching  In the last 24 hours, has pain interfered with the following? General activity 7 Relation with others NA Enjoyment of life 6 What TIME of day is your pain at its worst? morning, daytime, evening, night Sleep (in general) Poor  Pain is worse with: walking, bending, sitting, inactivity and standing Pain improves with: NA Relief from Meds: 5  Mobility how many minutes can you walk? 5-10 ability to climb steps?  yes do you drive?  no  Function not employed: date last employed NA I need assistance with the  following:  household duties and shopping  Neuro/Psych bladder control problems weakness numbness tingling trouble walking dizziness confusion depression anxiety  Prior Studies Any changes since last visit?  no  Physicians involved in your care NA   Family History  Problem Relation Age of Onset  . Bell's palsy Mother   . Cancer Father   . Stroke Father    Social History   Social History  . Marital Status: Divorced    Spouse Name: N/A  . Number of Children: N/A  . Years of Education: N/A   Social History Main Topics  . Smoking status: Current Every Day Smoker -- 0.50 packs/day for 25 years    Types: Cigarettes  . Smokeless tobacco: Never Used  . Alcohol Use: Yes     Comment: occ  . Drug Use: Yes    Special: Cocaine     Comment: 1-2x monthly  . Sexual Activity: Not Currently    Birth Control/ Protection: Surgical   Other Topics Concern  . None   Social History Narrative   Past Surgical History  Procedure Laterality Date  . Abdominal hysterectomy    . Laparoscopic  1988    removal  of ectopic preg, ruptured tube  . Therapeutic abortion    . Cesarean section    . Thyroidectomy  march 2011    cancer  . Goiter      removed   Past Medical History  Diagnosis Date  . Headache(784.0)   . Seizures ([place])   . Thyroid cancer (  [place]) 20011  . Bronchitis   . Urinary tract infection   . Ovarian cyst   . Fibroid   . Abnormal Pap smear   . Trichomonas    BP 153/94 mmHg  Pulse 72  Resp 14  SpO2 100%  Opioid Risk Score:   Fall Risk Score:  `1  Depression screen PHQ 2/9  Depression screen PHQ 2/9 01/05/2016  Decreased Interest 0  Down, Depressed, Hopeless 0  PHQ - 2 Score 0     Review of Systems  Constitutional: Positive for diaphoresis, appetite change and unexpected weight change.       Bladder control problems   Gastrointestinal: Positive for nausea and constipation.  Musculoskeletal: Positive for gait problem.  Neurological: Positive for  dizziness, weakness and numbness.       Tingling   Psychiatric/Behavioral: Positive for confusion and dysphoric mood. The patient is nervous/anxious.   All other systems reviewed and are negative.      Objective:   Physical Exam  Constitutional: She is oriented to person, place, and time. She appears well-developed and well-nourished.  HENT:  Head: Normocephalic and atraumatic.  Eyes: Conjunctivae and EOM are normal. Pupils are equal, round, and reactive to light.  Neck: Normal range of motion.  Cardiovascular: Normal rate, regular rhythm and normal heart sounds.   Pulmonary/Chest: Effort normal and breath sounds normal.  Abdominal: Soft. Bowel sounds are normal.  Musculoskeletal:       Right shoulder: She exhibits pain. She exhibits normal range of motion and no deformity.       Right foot: There is tenderness and deformity.       Left foot: There is tenderness and deformity.  Pes planus, callus between the metatarsal heads of second and third toes bilaterally also thickening over the spring ligament  Shoulder on the right has full range of motion but has pain with range of motion.  There is pain with external/internal rotation shoulder, impingement signs difficult to assess secondary to pain with movement  Neurological: She is alert and oriented to person, place, and time.  Motor strength is 4+ in the right deltoid, biceps, triceps, grip, 4+ right hip flexor, 5 in the right knee extensor and ankle dorsiflexor Left upper extremity 5/5 in deltoid, biceps, triceps, grip, left lower extremity 5/5 and left hip flexor and knee extensor and ankle dorsiflexor and plantar flexor  Sensation is intact to light touch in bilateral upper and lower limbs    Psychiatric: She has a normal mood and affect.  Nursing note and vitals reviewed.         Assessment & Plan:  1. Left caudate infarct causing residual mild right hemiparesis. Patient has regained independent function for ADLs as well  as mobility but still has some residual weakness and mild balance deficits. We discussed lifestyle issues including no smoking, no excessive alcohol, no greater than 1 drink per day for women, no illicit drugs.  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 I do not think she can go back to doing this. She has applied for disability but has not heard back. She may be able to do a less strenuous job such as a sedentary job.  2. Shoulder pain which the patient states has been since the stroke. She states she fell twice During the stroke she will see the orthopedic surgeon tomorrow, She has a history of acromial fracture on the left side, Last right shoulder film in 2012 was normal  Regards to therapy she  is already on independent level. She is continuing her home exercises per OT. She may regain some gross motor strength in the right upper but I do not expect her to regain her normal dexterity. She is no longer able to type.  Patient will follow up with orthopedics as noted above for her shoulder Follow-up with neurology for secondary stroke prophylaxis Follow-up with primary care for general medical issues  No physical medicine rehabilitation follow-up needed  Discussed return to driving, instructions as below. Discussed my recommendations with the patient as well as her mother who is in attendance during the entire visit  Graduated return to driving instructions were provided. It is recommended that the patient first drives with another licensed driver in an empty parking lot. If the patient does well with this, and they can drive on a quiet street with the licensed driver. If the patient does well with this they can drive on a busy street with a licensed driver. If the patient does well with this, the next time out they can go by himself. For the first month after resuming driving, I recommend no nighttime or Interstate driving.

## 2016-02-12 NOTE — Patient Instructions (Addendum)
Stroke Prevention Some health problems and behaviors may make it more likely for you to have a stroke. Below are ways to lessen your risk of having a stroke.   Be active for at least 30 minutes on most or all days.  Do not smoke. Try not to be around others who smoke.  Do not drink too much alcohol.  Do not have more than 2 drinks a day if you are a man.  Do not have more than 1 drink a day if you are a woman and are not pregnant.  Eat healthy foods, such as fruits and vegetables. If you were put on a specific diet, follow the diet as told.  Keep your cholesterol levels under control through diet and medicines. Look for foods that are low in saturated fat, trans fat, cholesterol, and are high in fiber.  If you have diabetes, follow all diet plans and take your medicine as told.  Ask your doctor if you need treatment to lower your blood pressure. If you have high blood pressure (hypertension), follow all diet plans and take your medicine as told by your doctor.  If you are 34-61 years old, have your blood pressure checked every 3-5 years. If you are age 7 or older, have your blood pressure checked every year.  Keep a healthy weight. Eat foods that are low in calories, salt, saturated fat, trans fat, and cholesterol.  Do not take drugs.  Avoid birth control pills, if this applies. Talk to your doctor about the risks of taking birth control pills.  Talk to your doctor if you have sleep problems (sleep apnea).  Take all medicine as told by your doctor.  You may be told to take aspirin or blood thinner medicine. Take this medicine as told by your doctor.  Understand your medicine instructions.  Make sure any other conditions you have are being taken care of. GET HELP RIGHT AWAY IF:  You suddenly lose feeling (you feel numb) or have weakness in your face, arm, or leg.  Your face or eyelid hangs down to one side.  You suddenly feel confused.  You have trouble talking (aphasia)  or understanding what people are saying.  You suddenly have trouble seeing in one or both eyes.  You suddenly have trouble walking.  You are dizzy.  You lose your balance or your movements are clumsy (uncoordinated).  You suddenly have a very bad headache and you do not know the cause.  You have new chest pain.  Your heart feels like it is fluttering or skipping a beat (irregular heartbeat). Do not wait to see if the symptoms above go away. Get help right away. Call your local emergency services (911 in [place] Do not drive yourself to the hospital.   This information is not intended to replace advice given to you by your health care provider. Make sure you discuss any questions you have with your health care provider.   Document Released: 05/05/2012 Document Revised: 11/25/2014 Document Reviewed: 05/07/2013 Elsevier Interactive Patient Education 2016 [place] return to driving instructions were provided. It is recommended that the patient first drives with another licensed driver in an empty parking lot. If the patient does well with this, and they can drive on a quiet street with the licensed driver. If the patient does well with this they can drive on a busy street with a licensed driver. If the patient does well with this, the next time out they can go by himself.  For the first month after resuming driving, I recommend no nighttime or Interstate driving.

## 2016-02-27 ENCOUNTER — Ambulatory Visit: Payer: Self-pay | Admitting: Neurology

## 2016-02-28 ENCOUNTER — Encounter: Payer: Self-pay | Admitting: Neurology

## 2016-02-28 ENCOUNTER — Ambulatory Visit (INDEPENDENT_AMBULATORY_CARE_PROVIDER_SITE_OTHER): Payer: Medicaid Other | Admitting: Neurology

## 2016-02-28 VITALS — BP 128/85 | HR 63 | Ht 65.0 in | Wt 159.0 lb

## 2016-02-28 DIAGNOSIS — I6381 Other cerebral infarction due to occlusion or stenosis of small artery: Secondary | ICD-10-CM

## 2016-02-28 DIAGNOSIS — I639 Cerebral infarction, unspecified: Secondary | ICD-10-CM

## 2016-02-28 NOTE — Progress Notes (Signed)
[facility] [address] [place]. [place] [address] [phone]       OFFICE FOLLOW-UP NOTE  Ms. Theresa Watkins Date of Birth:  Jun 15, 1966 Medical Record Number:  [id number]   HPI: 16 year African-American lady seen today for the first office follow-up visit for hospital admission for stroke in February 2017. Theresa Watkins is an 50 y.o. female with a history of hypertension, thyroid cancer and cocaine use, as well as reported history of stroke 7 years ago, presenting with new onset right facial and upper extremity weakness, as well as slurred speech. Patient also has had several Watkins over the past couple of days. She also admits to use of cocaine last night. Urine drug screen was positive for cocaine. There is an equivocal history of transient right lower extremity weakness. Information obtained from the patient is somewhat unreliable. CT scan of her head was unremarkable with no acute intracranial abnormality. NIH stroke score was 8. Her last known well was unclear. Patient was not administered TPA secondary to unknown last known well. She was admitted for further evaluation and treatment. Initial CT scan of the head on admission showed no definite infarct. Patient did not tolerate having an MRI done. Repeat CT scan of the head showed a small hemorrhagic left basal ganglia infarct. Transthoracic echo showed normal ejection fraction with a to left septal aneurysm with possibly a small patent foramen ovale. Carotid ultrasound showed no significant extracranial stenosis. Urine drug screen was positive for cocaine. Hemoglobin A1c was 5.6. LDL cholesterol was 119 and total cholesterol 181. Patient was counseled to quit smoking cigarettes and cocaine. She was started on Zocor for hyperlipidemia and aspirin. She went to inpatient rehabilitation and subsequently has been at home is received inpatient therapy but outpatient therapy has not yet been started. She states her speech has  improved though at times she still has trouble speaking completing sentences. She also has significant right hand weakness with diminished fine motor skills. Her right leg also tracks that time. She also has some posterior headaches which he describes as moderate in severity pulling sensation and appears to be more pronounced with neck movements. She is tolerating aspirin without bleeding or bruising and Zocor without muscle aches or pains. She quit smoking for a month but has recently restarted but smokes only a quarter pack per day. She states she is stop doing cocaine.  ROS:   14 system review of systems is positive for  activity and appetite change, fatigue, weight change, hearing loss, runny nose, trouble swallowing, drooling, eye itching, double vision and blurred vision, cough, shortness of breath, choking, leg swelling, excessive thirst, swollen abdomen, constipation, insomnia, frequent waking, daytime sleepiness, sleep talking, incontinence of bladder, urgency, aching muscles, walking difficulty, itching, easy bruising, memory loss, dizziness, headache, speech difficulty, weakness, agitation, confusion, decreased concentration, decreased concentration, depression, anxiety and nervousness. PMH:  Past Medical History  Diagnosis Date  . Headache(784.0)   . Seizures ([place])   . Thyroid cancer ([place]) [address]  . Bronchitis   . Urinary tract infection   . Ovarian cyst   . Fibroid   . Abnormal Pap smear   . Trichomonas   . Stroke [facility])     Social History:  Social History   Social History  . Marital Status: Divorced    Spouse Name: N/A  . Number of Children: N/A  . Years of Education: N/A   Occupational History  . Not on file.   Social History Main Topics  .  Smoking status: Light Tobacco Smoker -- 0.25 packs/day for 25 years    Types: Cigarettes  . Smokeless tobacco: Never Used     Comment: smoke 2 a day trying to quit  . Alcohol Use: 0.0 oz/week    0 Standard drinks or equivalent per  week     Comment: occ  . Drug Use: Yes    Special: Cocaine     Comment: 1-2x monthly  . Sexual Activity: Not Currently    Birth Control/ Protection: Surgical   Other Topics Concern  . Not on file   Social History Narrative    Medications:   Current Outpatient Prescriptions on File Prior to Visit  Medication Sig Dispense Refill  . acetaminophen-codeine (TYLENOL #3) 300-30 MG tablet Take 1 tablet by mouth every 4 (four) hours as needed for moderate pain. 30 tablet 0  . aspirin EC 325 MG EC tablet Take 1 tablet (325 mg total) by mouth daily. 30 tablet 0  . Calcium Carbonate-Vitamin D (CALCIUM + D PO) Take 2 tablets by mouth 2 (two) times daily.     . diclofenac sodium (VOLTAREN) 1 % GEL Apply 2 g topically 4 (four) times daily. 1 Tube 1  . levothyroxine (SYNTHROID, LEVOTHROID) 125 MCG tablet Take 1 tablet (125 mcg total) by mouth daily before breakfast. 30 tablet 1  . simvastatin (ZOCOR) 20 MG tablet Take 1 tablet (20 mg total) by mouth daily at 6 PM. 30 tablet 1   No current facility-administered medications on file prior to visit.    Allergies:   Allergies  Allergen Reactions  . Iohexol Hives, Itching and Other (See Comments)     Code: HIVES, Desc: pts tongue began itching post injection and throat burning, Onset Date: [id number]   . Percocet [Oxycodone-Acetaminophen] Rash  . Tramadol Rash    Physical Exam General: Middle-aged African-American lady, seated, in no evident distress Head: head normocephalic and atraumatic.  Neck: supple with no carotid or supraclavicular bruits Cardiovascular: regular rate and rhythm, no murmurs Musculoskeletal: no deformity. Right shoulder movements limited by pain Skin:  no rash/petichiae Vascular:  Normal pulses all extremities Filed Vitals:   02/28/16 1330  BP: 128/85  Pulse: 63   Neurologic Exam Mental Status: Awake and fully alert. Oriented to place and time. Recent and remote memory intact. Attention span, concentration and fund of  knowledge appropriate. Mood and affect appropriate.  Cranial Nerves: Fundoscopic exam reveals sharp disc margins. Pupils equal, briskly reactive to light. Extraocular movements full without nystagmus. Visual fields full to confrontation. Hearing intact. Facial sensation intact. Face, tongue, palate moves normally and symmetrically.  Motor: Mild right hemiparesis. Right shoulder elevation limited due to pain. 4/5 right-sided weakness with weakness of right grip, intrinsic hand muscles and right hip flexors and ankle dorsiflexors. Tone is slightly increased on the right compared to left.  Sensory.: intact to touch ,pinprick .position and vibratory sensation.  Coordination: Rapid alternating movements normal in all extremities. Finger-to-nose and heel-to-shin performed accurately bilaterally. Gait and Station: Arises from chair without difficulty. Stance is normal. Gait demonstrates hemiplegic gait with dragging of the right foot with stiffness and mild spasticity Reflexes: 1+ and symmetric. Toes downgoing.       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 I also advised the patient to eat a healthy diet with plenty of whole grains, cereals, fruits and vegetables, exercise regularly and maintain ideal body weight .I have counseled her to quit smoking completely as well as doing drugs like cocaine. I will refer her  for outpatient physical and occupational therapy. I also advised her to follow-up for Dr. Letta Pate for her right shoulder pain. I advised patient to take Tylenol for her tension headache as well as do regular neck stretching exercises. Followup in the future with stroke nurse practitioner in 3 months or call earlier if necessary.Greater than 50% of time during this 25 minute visit was spent on counseling,explanation of diagnosis, planning of further management, discussion with patient and family and coordination of care Antony Contras, MD Note: This document was prepared with digital dictation and possible smart phrase technology. Any transcriptional errors that result from this process are unintentional

## 2016-02-28 NOTE — Patient Instructions (Addendum)
I had a long d/w patient about her recent stroke, risk for recurrent stroke/TIAs, personally independently reviewed imaging studies and stroke evaluation results and answered questions.Continue aspirin 325 mg daily  for secondary stroke prevention and maintain strict control of hypertension with blood pressure goal below 130/90, diabetes with hemoglobin A1c goal below 6.5% and lipids with LDL cholesterol goal below 70 mg/dL. I also advised the patient to eat a healthy diet with plenty of whole grains, cereals, fruits and vegetables, exercise regularly and maintain ideal body weight .I have counseled her to quit smoking completely as well as doing drugs like cocaine. I will refer her for outpatient physical and occupational therapy. I also advised her to follow-up for Dr. Letta Pate for her right shoulder pain. I advised patient to take Tylenol for her tension headache as well as do regular neck stretching exercises. Followup in the future with stroke nurse practitioner in 3 months or call earlier if necessary Stroke Prevention Some medical conditions and behaviors are associated with an increased chance of having a stroke. You may prevent a stroke by making healthy choices and managing medical conditions. HOW CAN I REDUCE MY RISK OF HAVING A STROKE?   Stay physically active. Get at least 30 minutes of activity on most or all days.  Do not smoke. It may also be helpful to avoid exposure to secondhand smoke.  Limit alcohol use. Moderate alcohol use is considered to be:  No more than 2 drinks per day for men.  No more than 1 drink per day for nonpregnant women.  Eat healthy foods. This involves:  Eating 5 or more servings of fruits and vegetables a day.  Making dietary changes that address high blood pressure (hypertension), high cholesterol, diabetes, or obesity.  Manage your cholesterol levels.  Making food choices that are high in fiber and low in saturated fat, trans fat, and cholesterol may  control cholesterol levels.  Take any prescribed medicines to control cholesterol as directed by your health care provider.  Manage your diabetes.  Controlling your carbohydrate and sugar intake is recommended to manage diabetes.  Take any prescribed medicines to control diabetes as directed by your health care provider.  Control your hypertension.  Making food choices that are low in salt (sodium), saturated fat, trans fat, and cholesterol is recommended to manage hypertension.  Ask your health care provider if you need treatment to lower your blood pressure. Take any prescribed medicines to control hypertension as directed by your health care provider.  If you are 51-54 years of age, have your blood pressure checked every 3-5 years. If you are 58 years of age or older, have your blood pressure checked every year.  Maintain a healthy weight.  Reducing calorie intake and making food choices that are low in sodium, saturated fat, trans fat, and cholesterol are recommended to manage weight.  Stop drug abuse.  Avoid taking birth control pills.  Talk to your health care provider about the risks of taking birth control pills if you are over 55 years old, smoke, get migraines, or have ever had a blood clot.  Get evaluated for sleep disorders (sleep apnea).  Talk to your health care provider about getting a sleep evaluation if you snore a lot or have excessive sleepiness.  Take medicines only as directed by your health care provider.  For some people, aspirin or blood thinners (anticoagulants) are helpful in reducing the risk of forming abnormal blood clots that can lead to stroke. If you have the irregular  heart rhythm of atrial fibrillation, you should be on a blood thinner unless there is a good reason you cannot take them.  Understand all your medicine instructions.  Make sure that other conditions (such as anemia or atherosclerosis) are addressed. SEEK IMMEDIATE MEDICAL CARE IF:    You have sudden weakness or numbness of the face, arm, or leg, especially on one side of the body.  Your face or eyelid droops to one side.  You have sudden confusion.  You have trouble speaking (aphasia) or understanding.  You have sudden trouble seeing in one or both eyes.  You have sudden trouble walking.  You have dizziness.  You have a loss of balance or coordination.  You have a sudden, severe headache with no known cause.  You have new chest pain or an irregular heartbeat. Any of these symptoms may represent a serious problem that is an emergency. Do not wait to see if the symptoms will go away. Get medical help at once. Call your local emergency services (911 in [place] Do not drive yourself to the hospital.   This information is not intended to replace advice given to you by your health care provider. Make sure you discuss any questions you have with your health care provider.   Document Released: 12/12/2004 Document Revised: 11/25/2014 Document Reviewed: 05/07/2013 Elsevier Interactive Patient Education [employer].

## 2016-03-01 ENCOUNTER — Other Ambulatory Visit: Payer: Self-pay | Admitting: *Deleted

## 2016-03-01 MED ORDER — SIMVASTATIN 20 MG PO TABS: 20.0000 mg | ORAL_TABLET | Freq: Every day | ORAL | Status: DC

## 2016-03-19 ENCOUNTER — Ambulatory Visit: Payer: Medicaid Other | Admitting: Occupational Therapy

## 2016-03-19 ENCOUNTER — Ambulatory Visit: Payer: Medicaid Other | Attending: Neurology | Admitting: Physical Therapy

## 2016-03-19 ENCOUNTER — Encounter: Payer: Self-pay | Admitting: Occupational Therapy

## 2016-03-19 VITALS — BP 151/98

## 2016-03-19 DIAGNOSIS — R2681 Unsteadiness on feet: Secondary | ICD-10-CM

## 2016-03-19 DIAGNOSIS — R2689 Other abnormalities of gait and mobility: Secondary | ICD-10-CM

## 2016-03-19 DIAGNOSIS — M25611 Stiffness of right shoulder, not elsewhere classified: Secondary | ICD-10-CM | POA: Insufficient documentation

## 2016-03-19 DIAGNOSIS — M25511 Pain in right shoulder: Secondary | ICD-10-CM

## 2016-03-19 DIAGNOSIS — M6281 Muscle weakness (generalized): Secondary | ICD-10-CM | POA: Insufficient documentation

## 2016-03-19 DIAGNOSIS — G8191 Hemiplegia, unspecified affecting right dominant side: Secondary | ICD-10-CM | POA: Diagnosis present

## 2016-03-19 NOTE — Therapy (Signed)
[facility] [address] [place], [place], [address] Phone: [phone]   Fax:  [phone]  Occupational Therapy Evaluation  Patient Details  Name: Theresa Watkins MRN: [id number] Date of Birth: 08/15/1966 Referring Provider: Dr. Antony Contras  Encounter Date: 03/19/2016      OT End of Session - 03/19/16 1658    Visit Number 1   Number of Visits 12   Date for OT Re-Evaluation 05/14/16   Authorization Type pt has applied for Medicaid and is awaiting decision;  will then need to apply for approval for OT services.  Requesting 12 visits over a 4 month period.    OT Start Time 1104   OT Stop Time 1150   OT Time Calculation (min) 46 min   Activity Tolerance Patient tolerated treatment well      Past Medical History  Diagnosis Date  . Headache(784.0)   . Seizures ([place])   . Thyroid cancer ([place]) 20011  . Bronchitis   . Urinary tract infection   . Ovarian cyst   . Fibroid   . Abnormal Pap smear   . Trichomonas   . Stroke [facility])     Past Surgical History  Procedure Laterality Date  . Abdominal hysterectomy    . Laparoscopic  1988    removal  of ectopic preg, ruptured tube  . Therapeutic abortion    . Cesarean section    . Thyroidectomy  march 2011    cancer  . Goiter      removed    Filed Vitals:   03/19/16 1109  BP: 151/98        Subjective Assessment - 03/19/16 1112    Pertinent History sse epic snapshot   Currently in Pain? Yes   Pain Score 10-Worst pain ever  Pt reports a 10 however does not appear in this much distress whenm moving R arm   Pain Location Shoulder   Pain Orientation Right   Pain Descriptors / Indicators Sharp;Stabbing;Sore;Aching   Pain Onset More than a month ago   Pain Frequency Constant   Aggravating Factors  raising arm, when I try and do anything with it   Pain Relieving Factors keep it still keep shoulder and elbow still and just use my hand           [facility] OT  Assessment - 03/19/16 0001    Assessment   Diagnosis L BG CVA   Referring Provider Dr. Antony Contras   Onset Date 12/07/15   Prior Therapy Pt had inpt rehab with PT, OT and ST . Pt discharged from inpt on 01/02/2016.  HHPT, OT and ST.  HH therapies ended after approximately 3 weeks.    Precautions   Precautions Fall   Restrictions   Weight Bearing Restrictions No   Balance Screen   Has the patient fallen in the past 6 months Yes   How many times? 2-3  pt fell sometime after d/c from rehab but not sure when   Home  Environment   Family/patient expects to be discharged to: Private residence   Living Arrangements Alone  mother comes by every 3 days or so   Available Help at Discharge Family   Type of Home Other (Comment)  [place] Two level   Bathroom Ambulance person   Additional Comments Pt reports she has no equipment and has no diffculty accessing shower or toilet.    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   IADL   Shopping Takes care of all shopping needs independently   [place] alone or with occasional assistance   Meal Prep Plans, prepares and serves adequate meals independently  pt reports she only eats 1-2 times due to  cooking being hard   Programmer, applications own vehicle   Medication Management Is responsible for taking medication in correct dosages at correct time   Physiological scientist financial matters independently (budgets, writes checks, pays rent, bills goes to bank), collects and keeps track of income   Mobility   Mobility Status History of falls   Written Expression   Dominant Hand Right   Vision - History   Baseline Vision Wears glasses only for reading   Additional Comments Pt states used readers before but now vision is blurry and she hsa more trouble seeing . States readers help but vision is very blurry.    Vision Assessment   Eye Alignment Impaired (comment)  mild   Ocular Range of Motion Within Functional Limits   Tracking/Visual Pursuits Able to track stimulus in all quads without difficulty   Saccades Within functional limits   Convergence Within functional limits   Activity Tolerance   Activity Tolerance Tolerate 30+ min activity without fatigue   Cognition   Overall Cognitive Status Within Functional Limits for tasks assessed   Mini Mental State Exam  Pt with h/o of cocaine use and likely some higher level cognitive impairment in executive functioning however WFL's for patient's current lifestyle.   Sensation   Light Touch Appears Intact   Hot/Cold Appears Intact   Proprioception Impaired Detail   Coordination   Gross Motor Movements are Fluid and Coordinated No   Fine Motor Movements are Fluid and Coordinated No   Finger Nose Finger Test unable to assess due to pain in R shoulder   Other Pt unable to complete due to pain L shoulder/upper arm.  Pt does present with slowed finger movements when doing altenate finger touching. Pt reports R hand feels uncoordinated.    Tone   Assessment Location Right Upper Extremity   ROM / Strength   AROM / PROM / Strength AROM;Strength   AROM   Overall AROM  Deficits   Overall AROM Comments Pt with active shoulder abduction to  approximately 80* and shoulder flexion to approximately 70* due to pain.  With AAROM to eliminate some weight from the RUE pt able to achieve 160* shoulder flexion.  Pt states they could not do MRI in hospital due to claustrophobia. Pt is supposed to see orthopedic specialist but did not know if appt had been made yet - mother assisting pt with medical appts.    Strength   Overall Strength Deficits   Overall Strength Comments  Unable to fully assess due to pain in R shoulder. Pt appears to have proximal weakness in R shoulder as well as decreased grip strength    Hand Function   Right Hand Gross Grasp Impaired   Right Hand Grip (lbs) 15   Left Hand Gross Grasp Functional   Left Hand Grip (lbs) 38   RUE Tone   RUE Tone Within Functional Limits                           OT Short Term Goals - 03/19/16 1518    OT SHORT TERM GOAL #1   Title Pt will be mod I with HEP- 04/16/2016   Baseline dependent   Status New   OT SHORT TERM GOAL #2   Title Pt will report no more than 6/10 shoulder pain with shoulder flexion greater than 90* in supine   Baseline 10/10 at 70*   Status New   OT SHORT TERM GOAL #3   Title Pt will demonstrate improved grip strength by at least 5 pounds in R dominant hand to assist with functional tasks.   Baseline baseline = 15   Status New   OT SHORT TERM GOAL #4   Title Pt will be able to use RUE for low to mid reach functonal tasks with min compensations.   Baseline Pt currently using LUE for almost all functional tasks.    Status New           OT Long Term Goals - 03/19/16 1528    OT LONG TERM GOAL #1   Title Pt will be mod I wth upgraded HEP - 05/14/2016   Baseline dependent   Status New   OT LONG TERM GOAL #2   Title Pt will report pain no greater than 3/10 with at least 90* of shoulder flexion in sitting and standing   Baseline 70* with 10/10   Status New   OT LONG TERM GOAL #3   Title Pt will demonstrate at least 8 pounds of increased  grip strength to assist wtih functional tasks   Baseline 15 pounds   Status New   OT LONG TERM GOAL #4   Title Pt will demonstrate ability to use RUE as dominant for basic ADL tasks   Baseline Using LUE as dominant for ADL's at this time.   Status New               Plan - 03/19/16 1647    Clinical Impression Statement Pt is a 50 year old female s/p L BG CVA on 12/07/2015. Pt hospitalized from 12/07/2015-01/02/2016 (including inpt rehab stay).  Pt with PMH:  HTN, thyroid cancer, polysubstance abuse inxcluding alcohol and cocaine, and has had at least 2 falls since d/c.  Pt presents today with the following deficits that impact her ability to complete ADL and IADL activities independenty as well as use her dominant RUE:  R dominant hemiplegia, pain in R shoulder, decreased AROM and strength RUE, decreased grip strength, decreased functional use of RUE, impaired proprioception, decreased balance, blurry vision.  Pt will benefit from skilled OT to address these deficits to maximize independence and improve use of her dominant UE.     Rehab Potential Fair   Clinical Impairments Affecting Rehab Potential h/o of and possibly current use of cocaine, decreased ability for carry over of new learning   OT Frequency 2x / week   OT Duration 6 weeks  over a 4 month  period as pt has applied for Medicaid but is awaiting approval and will then need to submit approval for OT services.     OT Treatment/Interventions Self-care/ADL training;Electrical Stimulation;Moist Heat;Ultrasound;Therapeutic exercise;Neuromuscular education;DME and/or AE instruction;Passive range of motion;Manual Therapy;Therapist, nutritional;Therapeutic activities;Patient/family education;Balance training   Plan address pain, initiate HEP as able given pain   Consulted and Agree with Plan of Care Patient      Patient will benefit from skilled therapeutic intervention in order to improve the following deficits and impairments:   Decreased balance, Decreased mobility, Decreased range of motion, Decreased knowledge of use of DME, Decreased strength, Difficulty walking, Impaired UE functional use, Impaired sensation, Impaired vision/preception, Pain  Visit Diagnosis: Hemiplegia, unspecified affecting right dominant side ([place]) - Plan: Ot plan of care cert/re-cert  Pain in right shoulder - Plan: Ot plan of care cert/re-cert  Stiffness of right shoulder, not elsewhere classified - Plan: Ot plan of care cert/re-cert  Unsteadiness on feet - Plan: Ot plan of care cert/re-cert    Problem List Patient Active Problem List   Diagnosis Date Noted  . Tooth pain   . CVA (cerebral infarction) 12/27/2015  . Hypothyroidism due to acquired atrophy of thyroid   . HLD (hyperlipidemia)   . Hemiparesis affecting dominant side as late effect of stroke ([place])   . Dysarthria due to cerebrovascular accident (CVA) ([place])   . Tobacco abuse   . Cocaine abuse   . ETOH abuse   . Hypothyroidism (acquired) 12/24/2015  . Acute ischemic stroke ([place]) 12/24/2015  . Stroke ([place]) 12/24/2015  . Drug abuse, cocaine type 05/09/2014   Discussed with pt timing of when she wishes to start OT.  Pt wishes to see orthopedic MD for shoulder first and also wishes to gain Medicaid approval first prior to starting therapy due to financial reasons. Pt to contact neuro outpt when she receives this and when she has seen MD.   Quay Burow, OTR/L 03/19/2016, 5:05 PM  [facility] [address] [place], [place], [address] Phone: [phone]   Fax:  [phone]  Name: Theresa Watkins MRN: [id number] Date of Birth: Feb 01, 1966

## 2016-03-21 DIAGNOSIS — R2689 Other abnormalities of gait and mobility: Secondary | ICD-10-CM | POA: Diagnosis not present

## 2016-03-21 NOTE — Therapy (Signed)
[facility] [address] [place], [place], [address] Phone: [phone]   Fax:  [phone]  Physical Therapy Evaluation  Patient Details  Name: Theresa Watkins MRN: [id number] Date of Birth: Nov 06, 1966 Referring Provider: Leonie Man  Encounter Date: 03/19/2016      PT End of Session - 03/21/16 1325    Visit Number 1   Number of Visits 9   Date for PT Re-Evaluation 05/18/16   Authorization Type Self pay; pt has applied for Medicaid   PT Start Time 1150   PT Stop Time 1230   PT Time Calculation (min) 40 min   Equipment Utilized During Treatment Gait belt   Activity Tolerance Patient tolerated treatment well   Behavior During Therapy [facility] for tasks assessed/performed      Past Medical History  Diagnosis Date  . Headache(784.0)   . Seizures ([place])   . Thyroid cancer ([place]) 20011  . Bronchitis   . Urinary tract infection   . Ovarian cyst   . Fibroid   . Abnormal Pap smear   . Trichomonas   . Stroke [facility])     Past Surgical History  Procedure Laterality Date  . Abdominal hysterectomy    . Laparoscopic  1988    removal  of ectopic preg, ruptured tube  . Therapeutic abortion    . Cesarean section    . Thyroidectomy  march 2011    cancer  . Goiter      removed    There were no vitals filed for this visit.       Subjective Assessment - 03/21/16 1301    Subjective Pt is a 50 year old female who presents to OP PT status post CVA with R sided weakness in January 2017.  She feels she has gained some strength, but not quite back to 100%.  Pt notes no changes in balance or walking.  Pt has had at least 3-4 falls in the past 6 months.  One fall was down the steps.  Pt does not use assistive device.   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   Currently in Pain? Yes   Pain Score 10-Worst pain ever   Pain Location Shoulder   Pain  Orientation Right   Pain Descriptors / Indicators Sharp;Stabbing;Sore;Aching   Pain Onset More than a month ago   Pain Frequency Constant   Aggravating Factors  Raising arm, trying to do anything with it.   Pain Relieving Factors Keeping it still alleviates pain.  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   Observation/Other Assessments   Focus on Therapeutic Outcomes (FOTO)  Not completed at eval--   Sensation   Light Touch Impaired Detail   Light Touch Impaired Details Impaired RLE   ROM / Strength   AROM / PROM / Strength Strength   Strength   Strength Assessment Site Hip;Knee;Ankle   Right/Left Hip Right;Left   Right Hip Flexion 3-/5  Left Hip Flexion 4/5   Right/Left Knee Right;Left   Right Knee Flexion 4/5  inconsistent with MMT   Right Knee Extension 3+/5   Left Knee Flexion 3+/5   Left Knee Extension 3+/5   Right/Left Ankle Right;Left   Right Ankle Dorsiflexion 3+/5   Left Ankle Dorsiflexion 4/5   Transfers   Transfers Sit to Stand;Stand to Sit   Sit to Stand 6: Modified independent (Device/Increase time)   Stand to Sit 6: Modified independent (Device/Increase time)   Ambulation/Gait   Ambulation/Gait Yes   Ambulation/Gait Assistance 5: Supervision;4: Min guard   Ambulation Distance (Feet) 200 Feet   Assistive device None   Gait Pattern Step-through pattern;Wide base of support  bilateral int. rotation, [place] gait pattern   Ambulation Surface Level;Indoor   Gait velocity 11.20 sec = 2.93 ft/sec   Standardized Balance Assessment   Standardized Balance Assessment Timed Up and Go Test;Dynamic Gait  Index;Berg Balance Test   Berg Balance Test   Sit to Stand Able to stand  independently using hands   Standing Unsupported Able to stand 30 seconds unsupported  1:24, with excessive forward/back lean, repositions feet   Sitting with Back Unsupported but Feet Supported on Floor or Stool Able to sit safely and securely 2 minutes   Stand to Sit Controls descent by using hands   Transfers Able to transfer with verbal cueing and /or supervision   Standing Unsupported with Eyes Closed Needs help to keep from falling  immediately loses balance posteriorly   Standing Ubsupported with Feet Together Able to place feet together independently and stand for 1 minute with supervision  multiple episodes of forward on toes, back on heels   From Standing, Reach Forward with Outstretched Arm Can reach confidently >25 cm (10")   From Standing Position, Pick up Object from Floor Able to pick up shoe, needs supervision   From Standing Position, Turn to Look Behind Over each Shoulder Needs supervision when turning   Turn 360 Degrees Able to turn 360 degrees safely but slowly   Standing Unsupported, Alternately Place Feet on Step/Stool Able to complete 4 steps without aid or supervision   Standing Unsupported, One Foot in Front Able to plae foot ahead of the other independently and hold 30 seconds  increased hip and body sway, lean, squat   Standing on One Leg Tries to lift leg/unable to hold 3 seconds but remains standing independently   Total Score 33   Berg comment: Scores <45/ 56 are indicative of increased fall risk.   Dynamic Gait Index   Level Surface Mild Impairment   Change in Gait Speed Moderate Impairment   Gait with Horizontal Head Turns Mild Impairment   Gait with Vertical Head Turns Mild Impairment   Gait and Pivot Turn Moderate Impairment  takes step back, retro lean   Step Over Obstacle Moderate Impairment   Step Around Obstacles Mild Impairment   Steps Mild Impairment   Total Score 13    DGI comment: Scores <19/24 are indicative of increased fall risk.   Timed Up and Go Test   Normal TUG (seconds) 16.45                             PT Short Term Goals - 03/21/16 1331    PT SHORT TERM GOAL #1   Title Pt will be independent with HEP for improved balance, strength, and gait.  TARGET 04/18/16   Baseline No formal HEP  Time 4   Period Weeks   Status New   PT SHORT TERM GOAL #2   Title Pt will improve Berg Balance score to at least 38/56 for decreased fall risk.   Baseline Berg score 33/56 (Scores <45/56 are indicative of increased fall risk.)   Time 4   Period Weeks   Status New   PT SHORT TERM GOAL #3   Title Pt will improve TUG score to less than or equal to 13.5 seconds for decreased fall risk.   Baseline TUG score 16.45 sec (Scores >13.5 sec are indicative of increased fall risk.)   Time 4   Period Weeks   Status New   PT SHORT TERM GOAL #4   Title Pt will verbalize understanding of CVA warning signs and symptoms.   Baseline Dependent   Time 4   Period Weeks   Status New           PT Long Term Goals - 03-26-16 1335    PT LONG TERM GOAL #1   Title Pt will verbalize understanding of fall prevention within home environment.  TARGET 05/19/16   Baseline Pt at fall risk per Merrilee Jansky and TUG and DGI   Time 6   Period Weeks   Status New   PT LONG TERM GOAL #2   Title Pt will improve Berg Balance score to at least 43/56 for decreased fall risk.   Baseline Berg score 33/56   Time 8   Period Weeks   Status New   PT LONG TERM GOAL #3   Title Pt will improve Dynamic Gait Index score to at least 18/24 for decreased fall risk.   Baseline DGI 13/24   Time 8   Period Weeks   Status New               Plan - 03/26/16 1327    Clinical Impression Statement Pt is a 50 year old female s/p L BG CVA on 12/07/2015, with Hemiparesis affecting dominant side as late effect of stroke (I69.359). Pt hospitalized from 12/07/2015-01/02/2016 (including inpt  rehab stay). Pt with PMH: HTN, thyroid cancer, polysubstance abuse including alcohol and cocaine, and has had at least 2 falls since d/c.  Pt presents to OP PT with decreased balance, increased fall risk, decreased independence and safety with gait, decreased lower extremity strength.  Pt is at high fall risk per Merrilee Jansky, DGI and TUG scores.  Pt would benefit from skilled PT to address the above stated deficits to improve functional mobility and decrease risk of falls.   Rehab Potential Good   PT Frequency 2x / week   PT Duration 6 weeks  over a 4 month period, as pt has  applied for Medicaid, waiting approval for PT services.   PT Treatment/Interventions ADLs/Self Care Home Management;Therapeutic exercise;Therapeutic activities;Functional mobility training;Gait training;Balance training;Neuromuscular re-education;Patient/family education   PT Next Visit Plan Intiiate HEP for balance, strengthening, core stability   Consulted and Agree with Plan of Care Patient      Pt wants to hold off further scheduling of PT treatment sessions until she hears if Medicaid is approved.  Patient will benefit from skilled therapeutic intervention in order to improve the following deficits and impairments:  Abnormal gait, Decreased balance, Decreased mobility, Difficulty walking, Decreased strength  Visit Diagnosis: Other abnormalities of gait and mobility  Muscle weakness (generalized)      G-Codes - 03/26/16 1342    Functional Assessment Tool Used Merrilee Jansky 33/56, TUG 16.45 sec, DGI 13/24  Functional Limitation Mobility: Walking and moving around   Mobility: Walking and Moving Around Current Status [phone]) At least 40 percent but less than 60 percent impaired, limited or restricted   Mobility: Walking and Moving Around Goal Status [phone]) At least 20 percent but less than 40 percent impaired, limited or restricted       Problem List Patient Active Problem List   Diagnosis Date Noted  . Tooth pain   .  CVA (cerebral infarction) 12/27/2015  . Hypothyroidism due to acquired atrophy of thyroid   . HLD (hyperlipidemia)   . Hemiparesis affecting dominant side as late effect of stroke ([place])   . 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 Tobacco abuse   . Cocaine abuse   . ETOH abuse   . Hypothyroidism (acquired) 12/24/2015  . Acute ischemic stroke ([place]) 12/24/2015  . Stroke ([place]) 12/24/2015  . Drug abuse, cocaine type 05/09/2014    Tyrez Berrios W. 03/21/2016, 1:43 PM  [facility]t., PT [facility] [address] [place], [place], [address] Phone: [phone]   Fax:  [phone]  Name: ASIYA MUSCATELLO MRN: [id number] Date of Birth: 30-Oct-1966

## 2016-04-02 ENCOUNTER — Telehealth: Payer: Self-pay | Admitting: *Deleted

## 2016-04-02 NOTE — Telephone Encounter (Signed)
Message For: OFC                  Taken 16-MAY-17 at  2:36PM by TMW ------------------------------------------------------------ Ulanda Edison            CID [id number]  Patient SAME *NO CB NEEDED*  Pt's Dr NOT SURE     Area Code 336 Phone# [phone]     RE APPROVED FOR MEDICAID,WOULD LIKE TO PROVIDE       HER MEDICAID # FOR RECORDS/ [id number]              Disp:Y/N N If Y = C/B If No Response In 28minutes ============================================================

## 2016-04-30 ENCOUNTER — Ambulatory Visit (HOSPITAL_COMMUNITY)
Admission: EM | Admit: 2016-04-30 | Discharge: 2016-04-30 | Disposition: A | Discharge status: Home / Self Care | Payer: Medicaid Other | Attending: Family Medicine | Admitting: Family Medicine

## 2016-04-30 ENCOUNTER — Encounter (HOSPITAL_COMMUNITY): Payer: Self-pay | Admitting: Emergency Medicine

## 2016-04-30 ENCOUNTER — Ambulatory Visit (HOSPITAL_COMMUNITY): Payer: Medicaid Other

## 2016-04-30 DIAGNOSIS — M19011 Primary osteoarthritis, right shoulder: Secondary | ICD-10-CM | POA: Insufficient documentation

## 2016-04-30 DIAGNOSIS — Z91041 Radiographic dye allergy status: Secondary | ICD-10-CM | POA: Diagnosis not present

## 2016-04-30 DIAGNOSIS — M25511 Pain in right shoulder: Secondary | ICD-10-CM | POA: Diagnosis not present

## 2016-04-30 DIAGNOSIS — F17210 Nicotine dependence, cigarettes, uncomplicated: Secondary | ICD-10-CM | POA: Insufficient documentation

## 2016-04-30 DIAGNOSIS — Z885 Allergy status to narcotic agent status: Secondary | ICD-10-CM | POA: Diagnosis not present

## 2016-04-30 DIAGNOSIS — Z7982 Long term (current) use of aspirin: Secondary | ICD-10-CM | POA: Diagnosis not present

## 2016-04-30 DIAGNOSIS — Z85850 Personal history of malignant neoplasm of thyroid: Secondary | ICD-10-CM | POA: Insufficient documentation

## 2016-04-30 DIAGNOSIS — Z823 Family history of stroke: Secondary | ICD-10-CM | POA: Insufficient documentation

## 2016-04-30 DIAGNOSIS — Z8673 Personal history of transient ischemic attack (TIA), and cerebral infarction without residual deficits: Secondary | ICD-10-CM | POA: Diagnosis not present

## 2016-04-30 DIAGNOSIS — Z79899 Other long term (current) drug therapy: Secondary | ICD-10-CM | POA: Diagnosis not present

## 2016-04-30 MED ORDER — LEVOTHYROXINE SODIUM 125 MCG PO TABS: 125.0000 ug | ORAL_TABLET | Freq: Every day | ORAL | Status: DC

## 2016-04-30 MED ORDER — DICLOFENAC SODIUM 75 MG PO TBEC: 75.0000 mg | DELAYED_RELEASE_TABLET | Freq: Two times a day (BID) | ORAL | Status: DC

## 2016-04-30 NOTE — ED Notes (Signed)
Patient reports a stroke in February 2017.  Patient says she fell several times prior to being seen for stroke and does not feel her right shoulder was evaluated.  Complains of right shoulder pain with movement and getting worse.  Patient reports intermittent episodes of dizziness.  This nurse was called to assessment room for evaluation

## 2016-04-30 NOTE — ED Notes (Signed)
PT taken crackers, PB, and a drink. No further requests at this time.

## 2016-04-30 NOTE — ED Provider Notes (Signed)
CSN: [id number]     Arrival date & time 04/30/16  1503 History   First MD Initiated Contact with Patient 04/30/16 1607     Chief Complaint  Patient presents with  . Shoulder Pain   (Consider location/radiation/quality/duration/timing/severity/associated sxs/prior Treatment) Patient is a 50 y.o. female presenting with shoulder pain. The history is provided by the patient. No language interpreter was used.  Shoulder Pain Location:  Shoulder Injury: no   Shoulder location:  R shoulder Pain details:    Quality:  Aching   Radiates to:  Does not radiate   Severity:  Moderate   Onset quality:  Gradual   Timing:  Constant   Progression:  Worsening Chronicity:  New Handedness:  Right-handed Dislocation: no   Foreign body present:  No foreign bodies Prior injury to area:  No Relieved by:  Nothing Worsened by:  Nothing tried Ineffective treatments:  None tried Associated symptoms: no back pain   Risk factors: no recent illness   Pt has multiple complaints. Pt had a fall before her stroke.  She is worried that she has a fracture that healed inappropriately  Pt needs a refill of her thyroid medications.  She has decreased ability to lift right arm and use shoulder that is not related to her stroke.  Pt also has a lump on her foot that bothers her  Past Medical History  Diagnosis Date  . Headache(784.0)   . Seizures ([place])   . Thyroid cancer ([place]) 20011  . Bronchitis   . Urinary tract infection   . Ovarian cyst   . Fibroid   . Abnormal Pap smear   . Trichomonas   . Stroke [facility])    Past Surgical History  Procedure Laterality Date  . Abdominal hysterectomy    . Laparoscopic  1988    removal  of ectopic preg, ruptured tube  . Therapeutic abortion    . Cesarean section    . Thyroidectomy  march 2011    cancer  . Goiter      removed   Family History  Problem Relation Age of Onset  . Bell's palsy Mother   . Cancer Father   . Stroke Father    Social History  Substance Use  Topics  . Smoking status: Light Tobacco Smoker -- 0.25 packs/day for 25 years    Types: Cigarettes  . Smokeless tobacco: Never Used     Comment: smoke 2 a day trying to quit  . Alcohol Use: 0.0 oz/week    0 Standard drinks or equivalent per week     Comment: occ   OB History    Gravida Para Term Preterm AB TAB SAB Ectopic Multiple Living   3 1 1  0 2 1 0 1 0 1     Review of Systems  Musculoskeletal: Positive for myalgias. Negative for back pain.  All other systems reviewed and are negative.   Allergies  Iohexol; Percocet; and Tramadol  Home Medications   Prior to Admission medications   Medication Sig Start Date End Date Taking? Authorizing Provider  acetaminophen-codeine (TYLENOL #3) 300-30 MG tablet Take 1 tablet by mouth every 4 (four) hours as needed for moderate pain. 01/08/16   Brayton Caves, PA-C  aspirin EC 325 MG EC tablet Take 1 tablet (325 mg total) by mouth daily. 12/27/15   Modena Jansky, MD  Calcium Carbonate-Vitamin D (CALCIUM + D PO) Take 2 tablets by mouth 2 (two) times daily.     Historical Provider, MD  diclofenac  sodium (VOLTAREN) 1 % GEL Apply 2 g topically 4 (four) times daily. 01/01/16   Lavon Paganini Angiulli, PA-C  levothyroxine (SYNTHROID, LEVOTHROID) 125 MCG tablet Take 1 tablet (125 mcg total) by mouth daily before breakfast. 01/01/16   Lavon Paganini Angiulli, PA-C  simvastatin (ZOCOR) 20 MG tablet Take 1 tablet (20 mg total) by mouth daily at 6 PM. 03/01/16   Charlett Blake, MD   Meds Ordered and Administered this Visit  Medications - No data to display  BP 139/79 mmHg  Pulse 59  Temp(Src) 98.9 F (37.2 C) (Oral)  Resp 18  SpO2 100% No data found.   Physical Exam  Constitutional: She is oriented to person, place, and time. She appears well-developed and well-nourished.  HENT:  Head: Normocephalic and atraumatic.  Right Ear: External ear normal.  Left Ear: External ear normal.  Eyes: Conjunctivae and EOM are normal. Pupils are equal, round, and  reactive to light.  Neck: Normal range of motion.  Cardiovascular: Normal rate and normal heart sounds.   Pulmonary/Chest: Effort normal.  Abdominal: She exhibits no distension.  Musculoskeletal: Normal range of motion.  Decreased range of motion r shoulder.  2cm area of swelling base of  Left foot   Neurological: She is alert and oriented to person, place, and time.  Psychiatric: She has a normal mood and affect.  Nursing note and vitals reviewed.   ED Course  Procedures (including critical care time)  Labs Review Labs Reviewed - No data to display  Imaging Review No results found.   Visual Acuity Review  Right Eye Distance:   Left Eye Distance:   Bilateral Distance:    Right Eye Near:   Left Eye Near:    Bilateral Near:         MDM   1. Shoulder pain, right    Meds ordered this encounter  Medications  . diclofenac (VOLTAREN) 75 MG EC tablet    Sig: Take 1 tablet (75 mg total) by mouth 2 (two) times daily.    Dispense:  14 tablet    Refill:  0    Order Specific Question:  Supervising Provider    Answer:  Billy Fischer [phone]  . DISCONTD: levothyroxine (SYNTHROID, LEVOTHROID) 125 MCG tablet    Sig: Take 1 tablet (125 mcg total) by mouth daily before breakfast.    Dispense:  30 tablet    Refill:  1    Order Specific Question:  Supervising Provider    Answer:  Billy Fischer [phone]  . levothyroxine (SYNTHROID, LEVOTHROID) 125 MCG tablet    Sig: Take 1 tablet (125 mcg total) by mouth daily before breakfast.    Dispense:  30 tablet    Refill:  1    Order Specific Question:  Supervising Provider    Answer:  Billy Fischer [phone]   An After Visit Summary was printed and given to the patient.   [place], PA-C 04/30/16 [phone]

## 2016-04-30 NOTE — Discharge Instructions (Signed)
Adhesive Capsulitis Adhesive capsulitis is inflammation of the tendons and ligaments that surround the shoulder joint (shoulder capsule). This condition causes the shoulder to become stiff and painful to move. Adhesive capsulitis is also called frozen shoulder. CAUSES This condition may be caused by:  An injury to the shoulder joint.  Straining the shoulder.  Not moving the shoulder for a period of time. This can happen if your arm was injured or in a sling.  Long-standing health problems, such as:  Diabetes.  Thyroid problems.  Heart disease.  Stroke.  Rheumatoid arthritis.  Lung disease. In some cases, the cause may not be known. RISK FACTORS This condition is more likely to develop in:  Women.  People who are older than 51 years of age. SYMPTOMS Symptoms of this condition include:  Pain in the shoulder when moving the arm. There may also be pain when parts of the shoulder are touched. The pain is worse at night or when at rest.  Soreness or aching in the shoulder.  Inability to move the shoulder normally.  Muscle spasms. DIAGNOSIS This condition is diagnosed with a physical exam and imaging tests, such as an X-ray or MRI. TREATMENT This condition may be treated with:  Treatment of the underlying cause or condition.  Physical therapy. This involves performing exercises to get the shoulder moving again.  Medicine. Medicine may be given to relieve pain, inflammation, or muscle spasms.  Steroid injections into the shoulder joint.  Shoulder manipulation. This is a procedure to move the shoulder into another position. It is done after you are given a medicine to make you fall asleep (general anesthetic). The joint may also be injected with salt water at high pressure to break down scarring.  Surgery. This may be done in severe cases when other treatments have failed. Although most people recover completely from adhesive capsulitis, some may not regain the full  movement of the shoulder. HOME CARE INSTRUCTIONS  Take over-the-counter and prescription medicines only as told by your health care provider.  If you are being treated with physical therapy, follow instructions from your physical therapist.  Avoid exercises that put a lot of demand on your shoulder, such as throwing. These exercises can make pain worse.  If directed, apply ice to the injured area:  Put ice in a plastic bag.  Place a towel between your skin and the bag.  Leave the ice on for 20 minutes, 2-3 times per day. SEEK MEDICAL CARE IF:  You develop new symptoms.  Your symptoms get worse.   This information is not intended to replace advice given to you by your health care provider. Make sure you discuss any questions you have with your health care provider.   Document Released: 09/01/2009 Document Revised: 07/26/2015 Document Reviewed: 02/27/2015 Elsevier Interactive Patient Education [employer].

## 2016-05-29 ENCOUNTER — Ambulatory Visit: Payer: Self-pay | Admitting: Nurse Practitioner

## 2016-05-30 ENCOUNTER — Encounter: Payer: Self-pay | Admitting: Nurse Practitioner

## 2016-05-30 ENCOUNTER — Ambulatory Visit (INDEPENDENT_AMBULATORY_CARE_PROVIDER_SITE_OTHER): Payer: Medicaid Other | Admitting: Nurse Practitioner

## 2016-05-30 VITALS — BP 132/85 | HR 72 | Ht 65.0 in | Wt 156.2 lb

## 2016-05-30 DIAGNOSIS — I639 Cerebral infarction, unspecified: Secondary | ICD-10-CM

## 2016-05-30 DIAGNOSIS — F1410 Cocaine abuse, uncomplicated: Secondary | ICD-10-CM | POA: Diagnosis not present

## 2016-05-30 DIAGNOSIS — Z720 Tobacco use: Secondary | ICD-10-CM

## 2016-05-30 DIAGNOSIS — I69359 Hemiplegia and hemiparesis following cerebral infarction affecting unspecified side: Secondary | ICD-10-CM | POA: Diagnosis not present

## 2016-05-30 DIAGNOSIS — E785 Hyperlipidemia, unspecified: Secondary | ICD-10-CM | POA: Diagnosis not present

## 2016-05-30 NOTE — Progress Notes (Signed)
[facility]  PATIENT: Theresa Watkins DOB: 11/21/1965    REASON FOR VISIT: Follow-up for stroke that occurred in February 2017 lacunar infarct HISTORY FROM: Patient    HISTORY OF PRESENT ILLNESS:UPDATE 07/13/2017CM  Ms.Theresa Watkins, 50 year old female returns for follow-up last seen in the office by Dr. Leonie Man 02/28/2016 for hospital follow-up for stroke. She remains on aspirin and Zocor for secondary stroke prevention without recurrent stroke or TIA symptoms. She does complain of blurred vision today and she has not seen an ophthalmologist in over 30 years. She also complains of right shoulder pain, seen in the emergency room on 04/30/2016. Right shoulder x-ray with mild degenerative joint disease. She had therapies ordered when last seen however she just got her Medicaid  Yesterday so she will follow-up for some strengthening exercises. She continues to smoke and was encouraged to quit she also has a history of cocaine use. She denies any recent cocaine use. She complains with headaches today  posterior headaches which she describes as moderate in severity pulling sensation and appears to be more pronounced with neck movements.and says the only thing that helps his Vicodin. She was made aware we do not prescribe narcotic medications for headaches. She does not want to be on a preventive. She also complains of vaginal bleeding and was made aware she needs to see OB/GYN. Her speech has improved. She returns for reevaluation   HISTORY 02/28/16 PS49 year African-American lady seen today for the first office follow-up visit for hospital admission for stroke in February 2017. Theresa Watkins is an 50 y.o. female with a history of hypertension, thyroid cancer and cocaine use, as well as reported history of stroke 7 years ago, presenting with new onset right facial and upper extremity weakness, as well as slurred speech. Patient also has had several falls over the past couple of days. She  also admits to use of cocaine last night. Urine drug screen was positive for cocaine. There is an equivocal history of transient right lower extremity weakness. Information obtained from the patient is somewhat unreliable. CT scan of her head was unremarkable with no acute intracranial abnormality. NIH stroke score was 8. Her last known well was unclear. Patient was not administered TPA secondary to unknown last known well. She was admitted for further evaluation and treatment. Initial CT scan of the head on admission showed no definite infarct. Patient did not tolerate having an MRI done. Repeat CT scan of the head showed a small hemorrhagic left basal ganglia infarct. Transthoracic echo showed normal ejection fraction with a to left septal aneurysm with possibly a small patent foramen ovale. Carotid ultrasound showed no significant extracranial stenosis. Urine drug screen was positive for cocaine. Hemoglobin A1c was 5.6. LDL cholesterol was 119 and total cholesterol 181. Patient was counseled to quit smoking cigarettes and cocaine. She was started on Zocor for hyperlipidemia and aspirin. She went to inpatient rehabilitation and subsequently has been at home is received inpatient therapy but outpatient therapy has not yet been started. She states her speech has improved though at times she still has trouble speaking completing sentences. She also has significant right hand weakness with diminished fine motor skills. Her right leg also tracks that time. She also has some posterior headaches which he describes as moderate in severity pulling sensation and appears to be more pronounced with neck movements. She is tolerating aspirin without bleeding or bruising and Zocor without muscle aches or pains. She quit smoking for a month but has recently restarted  but smokes only a quarter pack per day. She states she is stop doing cocaine.  REVIEW OF SYSTEMS: Full 14 system review of systems performed and notable only for  those listed, all others are neg:  Constitutional: neg  Cardiovascular: neg Ear/Nose/Throat: neg  Skin: neg Eyes: Blurred vision Respiratory: neg Gastroitestinal: neg  Genitourinary vaginal bleeding Hematology/Lymphatic: neg  Endocrine: neg Musculoskeletal: Right shoulder pain Allergy/Immunology: neg Neurological: neg Psychiatric: neg Sleep : neg   ALLERGIES: Allergies  Allergen Reactions  . Iohexol Hives, Itching and Other (See Comments)     Code: HIVES, Desc: pts tongue began itching post injection and throat burning, Onset Date: [id number]   . Percocet [Oxycodone-Acetaminophen] Rash  . Tramadol Rash    HOME MEDICATIONS: Outpatient Prescriptions Prior to Visit  Medication Sig Dispense Refill  . acetaminophen-codeine (TYLENOL #3) 300-30 MG tablet Take 1 tablet by mouth every 4 (four) hours as needed for moderate pain. 30 tablet 0  . aspirin EC 325 MG EC tablet Take 1 tablet (325 mg total) by mouth daily. 30 tablet 0  . Calcium Carbonate-Vitamin D (CALCIUM + D PO) Take 2 tablets by mouth 2 (two) times daily.     . diclofenac (VOLTAREN) 75 MG EC tablet Take 1 tablet (75 mg total) by mouth 2 (two) times daily. 14 tablet 0  . diclofenac sodium (VOLTAREN) 1 % GEL Apply 2 g topically 4 (four) times daily. 1 Tube 1  . levothyroxine (SYNTHROID, LEVOTHROID) 125 MCG tablet Take 1 tablet (125 mcg total) by mouth daily before breakfast. 30 tablet 1  . simvastatin (ZOCOR) 20 MG tablet Take 1 tablet (20 mg total) by mouth daily at 6 PM. 30 tablet 2   No facility-administered medications prior to visit.    PAST MEDICAL HISTORY: Past Medical History  Diagnosis Date  . Headache(784.0)   . Seizures ([place])   . Thyroid cancer ([place]) [address]11  . Bronchitis   . Urinary tract infection   . Ovarian cyst   . Fibroid   . Abnormal Pap smear   . Trichomonas   . Stroke [facility])     PAST SURGICAL HISTORY: Past Surgical History  Procedure Laterality Date  . Abdominal hysterectomy    .  Laparoscopic  1988    removal  of ectopic preg, ruptured tube  . Therapeutic abortion    . Cesarean section    . Thyroidectomy  march 2011    cancer  . Goiter      removed    FAMILY HISTORY: Family History  Problem Relation Age of Onset  . Bell's palsy Mother   . Cancer Father   . Stroke Father     SOCIAL HISTORY: Social History   Social History  . Marital Status: Divorced    Spouse Name: N/A  . Number of Children: N/A  . Years of Education: N/A   Occupational History  . Not on file.   Social History Main Topics  . Smoking status: Light Tobacco Smoker -- 0.10 packs/day for 25 years    Types: Cigarettes  . Smokeless tobacco: Never Used     Comment: smoke 2 a day trying to quit  . Alcohol Use: 0.0 oz/week    0 Standard drinks or equivalent per week     Comment: occ  . Drug Use: Yes    Special: Cocaine     Comment: 1-2x monthly  . Sexual Activity: Not Currently    Birth Control/ Protection: Surgical   Other Topics Concern  . Not on file  Social History Narrative     PHYSICAL EXAM  Filed Vitals:   05/30/16 0949  BP: 132/85  Pulse: 72  Height: 5\' 5"  (1.651 m)  Weight: 156 lb 3.2 oz (70.852 kg)   Body mass index is 25.99 kg/(m^2). General: Middle-aged African-American lady, seated, in no evident distress Head: head normocephalic and atraumatic.  Neck: supple with no carotid or supraclavicular bruits Cardiovascular: regular rate and rhythm, no murmurs Musculoskeletal: no deformity. Right shoulder movements limited by pain Skin: no rash/petichiae Vascular: Normal pulses all extremities  Neurological examination   Mental Status: Awake and fully alert. Oriented to place and time. Recent and remote memory intact. Attention span, concentration and fund of knowledge appropriate. Mood and affect appropriate.  Cranial Nerves: Fundoscopic exam reveals sharp disc margins. Visual acuity 20/70 right 20/100 left. Pupils equal, briskly reactive to light.  Extraocular movements full without nystagmus. Visual fields full to confrontation. Hearing intact. Facial sensation intact. Face, tongue, palate moves normally and symmetrically.  Motor: Mild right hemiparesis. Right shoulder elevation limited due to pain. 4/5 right-sided weakness with weakness of right grip, intrinsic hand muscles and right hip flexors and ankle dorsiflexors. Tone is slightly increased on the right compared to left.  Sensory.: intact to touch ,pinprick .position and vibratory sensation.  Coordination: Rapid alternating movements normal in all extremities. Finger-to-nose and heel-to-shin performed accurately bilaterally. Gait and Station: Arises from chair without difficulty. Stance is normal. Gait demonstrates hemiplegic gait with dragging of the right foot with stiffness and mild spasticity  Reflexes: 1+ and symmetric. Toes downgoing.  DIAGNOSTIC DATA (LABS, IMAGING, TESTING) - I reviewed patient records, labs, notes, testing and imaging myself where available.  Lab Results  Component Value Date   WBC 5.5 12/28/2015   HGB 14.9 12/28/2015   HCT 44.9 12/28/2015   MCV 86.2 12/28/2015   PLT 173 12/28/2015      Component Value Date/Time   NA 140 12/28/2015 0616   K 4.1 12/28/2015 0616   CL 106 12/28/2015 0616   CO2 24 12/28/2015 0616   GLUCOSE 94 12/28/2015 0616   BUN 11 12/28/2015 0616   CREATININE 0.79 12/28/2015 0616   CALCIUM 8.7* 12/28/2015 0616   PROT 6.3* 12/28/2015 0616   ALBUMIN 3.4* 12/28/2015 0616   AST 15 12/28/2015 0616   ALT 11* 12/28/2015 0616   ALKPHOS 58 12/28/2015 0616   BILITOT 0.5 12/28/2015 0616   GFRNONAA >60 12/28/2015 0616   GFRAA >60 12/28/2015 0616   Lab Results  Component Value Date   CHOL 228* 12/26/2015   HDL 62 12/26/2015   LDLCALC 135* 12/26/2015   TRIG 157* 12/26/2015   CHOLHDL 3.7 12/26/2015   Lab Results  Component Value Date   HGBA1C 5.8* 12/26/2015   No results found for: [id number] Lab Results  Component Value  Date   TSH 18.683* 12/26/2015      ASSESSMENT AND PLAN 50 year old African-American lady with small left basal ganglia hemorrhagic infarct in therapy 2017 due to 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 The patient is a current patient of Dr. Leonie Man who is out of the office today . This note is sent to the work in doctor.     PLANContinue aspirin 325 mg daily for secondary stroke prevention  maintain strict control of hypertension with blood pressure goal below 130/90,  todays reading 135/82 lipids with LDL cholesterol goal below 70 mg/dL.  Eat a healthy diet with plenty of whole grains, cereals, fruits and vegetables,  Rehab services  for 4/5 right-sided weakness with weakness of right grip, intrinsic hand muscles and right hip flexors and ankle dorsiflexors. Quit smoking completely. No alcohol or cocaine Need to follow up with [facility] regarding vaginal bleeding Need to follow up with eye doctor regarding vision Additional 10 minutes spent answering multiple questions Follow up here in 4 monthsVst time 40 min Dennie Bible, [facility], [facility], [facility] [address], [place], [place] [address] [phone]

## 2016-05-30 NOTE — Progress Notes (Signed)
I reviewed above note and agree with the assessment and plan.  Rosalin Hawking, MD PhD Stroke Neurology 05/30/2016 5:52 PM

## 2016-05-30 NOTE — Patient Instructions (Addendum)
Continue aspirin 325 mg daily for secondary stroke prevention  maintain strict control of hypertension with blood pressure goal below 130/90,  todays reading 135/82 lipids with LDL cholesterol goal below 70 mg/dL.  Eat a healthy diet with plenty of whole grains, cereals, fruits and vegetables,  Rehab services  Quit smoking completely. Need to follow up with [facility] regarding vaginal bleeding Need to follow up with eye doctor regarding vision Follow up here in 4 months

## 2016-06-13 ENCOUNTER — Telehealth: Payer: Self-pay | Admitting: Neurology

## 2016-06-13 NOTE — Telephone Encounter (Signed)
IF patient calls back she just needs to call neuro rehab next door to schedule her PT and OT.The order is good till 08/2016. Rn left message on pts home phone. Rn tried calling her cell but it kept ranging no vm set up.

## 2016-06-13 NOTE — Telephone Encounter (Signed)
Patient came in saying that she needs a new referral sent over to rehab.. She said now she has medicaid and she can do rehab. The best number to contact the patient is [phone]

## 2016-07-03 ENCOUNTER — Telehealth: Payer: Self-pay | Admitting: *Deleted

## 2016-07-03 NOTE — Telephone Encounter (Signed)
Medical records faxed to Disability @ [phone]

## 2016-07-23 ENCOUNTER — Telehealth: Payer: Self-pay

## 2016-07-23 ENCOUNTER — Other Ambulatory Visit: Payer: Self-pay | Admitting: Internal Medicine

## 2016-07-23 MED ORDER — LEVOTHYROXINE SODIUM 125 MCG PO TABS: 125.0000 ug | ORAL_TABLET | Freq: Every day | ORAL | 0 refills | Status: DC

## 2016-07-23 NOTE — Telephone Encounter (Signed)
Pt is put of her simvastatin. Tried calling pt back to make her aware that her PCP needs to fill this. Unable to leave message.

## 2016-07-25 MED ORDER — SIMVASTATIN 20 MG PO TABS: 20.0000 mg | ORAL_TABLET | Freq: Every day | ORAL | 2 refills | Status: DC

## 2016-07-25 NOTE — Telephone Encounter (Signed)
Spoke with pt. Pt states that she does not have a PCP. Will refill simvastatin. Pt is aware of refill.

## 2016-08-06 ENCOUNTER — Ambulatory Visit: Payer: Medicaid Other | Admitting: Internal Medicine

## 2016-08-27 ENCOUNTER — Emergency Department (HOSPITAL_COMMUNITY): Payer: Medicaid Other

## 2016-08-27 ENCOUNTER — Emergency Department (HOSPITAL_COMMUNITY)
Admission: EM | Admit: 2016-08-27 | Discharge: 2016-08-27 | Disposition: A | Discharge status: Home / Self Care | Payer: Medicaid Other | Attending: Emergency Medicine | Admitting: Emergency Medicine

## 2016-08-27 ENCOUNTER — Encounter (HOSPITAL_COMMUNITY): Payer: Self-pay | Admitting: Emergency Medicine

## 2016-08-27 DIAGNOSIS — Z8673 Personal history of transient ischemic attack (TIA), and cerebral infarction without residual deficits: Secondary | ICD-10-CM | POA: Insufficient documentation

## 2016-08-27 DIAGNOSIS — M722 Plantar fascial fibromatosis: Secondary | ICD-10-CM | POA: Diagnosis not present

## 2016-08-27 DIAGNOSIS — M25561 Pain in right knee: Secondary | ICD-10-CM | POA: Diagnosis present

## 2016-08-27 DIAGNOSIS — Z85850 Personal history of malignant neoplasm of thyroid: Secondary | ICD-10-CM | POA: Insufficient documentation

## 2016-08-27 DIAGNOSIS — F17210 Nicotine dependence, cigarettes, uncomplicated: Secondary | ICD-10-CM | POA: Insufficient documentation

## 2016-08-27 DIAGNOSIS — Z7982 Long term (current) use of aspirin: Secondary | ICD-10-CM | POA: Insufficient documentation

## 2016-08-27 DIAGNOSIS — E039 Hypothyroidism, unspecified: Secondary | ICD-10-CM | POA: Diagnosis not present

## 2016-08-27 MED ORDER — LEVOTHYROXINE SODIUM 125 MCG PO TABS: 125.0000 ug | ORAL_TABLET | Freq: Every day | ORAL | 0 refills | Status: DC

## 2016-08-27 MED ORDER — NAPROXEN 500 MG PO TABS: 500.0000 mg | ORAL_TABLET | Freq: Two times a day (BID) | ORAL | 0 refills | Status: DC

## 2016-08-27 NOTE — ED Notes (Signed)
Patient back from x-ray 

## 2016-08-27 NOTE — ED Provider Notes (Signed)
[facility] DEPT Provider Note   CSN: [id number] Arrival date & time: 08/27/16  1005  By signing my name below, I, Arianna Nassar, attest that this documentation has been prepared under the direction and in the presence of Margarita Mail, PA-C.  Electronically Signed: Julien Nordmann, ED Scribe. 08/27/16. 12:11 PM.    History   Chief Complaint Chief Complaint  Patient presents with  . Leg Pain    The history is provided by the patient. No language interpreter was used.   HPI Comments: Theresa Watkins is a 50 y.o. female who presents to the Emergency Department complaining of  Foot pain and knee pain. 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 She has gait and balance issues after her stroke. The patient states that yesterday she was seated in the kneeling position, going through a box and sat for a long time. She did not realize that her legs had gone to sleep and when she stood up she fell forward against her [place] table. She fell onto the right knee and hit her chest against the edge. She denies any chest pain, shortness of breath, or hemoptysis. Does have some bruising across the top of her breast. She is complaining of pain and swelling in the right knee. She states that it is worse when bearing weight. Better at rest. She denies any previous history of injury to the area. Patient also complains of pain in the bottom of her left foot. She's noticed some swelling in the middle of the foot. She saw primary care physician. Return for cold compresses on it. It has grown steadily over the past month or 2, with a second small nodule developing just next to it. It is painful to the touch and when bearing weight. She denies any history of gout or TOPHI. Past Medical History:  Diagnosis Date  . Abnormal Pap smear   . Bronchitis   . Fibroid   . Headache(784.0)   . Ovarian cyst   . Seizures ([place])   . Stroke ([place])   . Thyroid  cancer ([place]) [address]11  . Trichomonas   . Urinary tract infection     Patient Active Problem List   Diagnosis Date Noted  . Tooth pain   . CVA (cerebral infarction) 12/27/2015  . Hypothyroidism due to acquired atrophy of thyroid   . HLD (hyperlipidemia)   . Hemiparesis affecting dominant side as late effect of stroke ([place])   . Dysarthria due to cerebrovascular accident (CVA) ([place])   . Tobacco abuse   . Cocaine abuse   . ETOH abuse   . Hypothyroidism (acquired) 12/24/2015  . Acute ischemic stroke ([place]) 12/24/2015  . Stroke ([place]) 12/24/2015  . Drug abuse, cocaine type 05/09/2014    Past Surgical History:  Procedure Laterality Date  . ABDOMINAL HYSTERECTOMY    . CESAREAN SECTION    . goiter     removed  . laparoscopic  1988   removal  of ectopic preg, ruptured tube  . THERAPEUTIC ABORTION    . THYROIDECTOMY  march 2011   cancer    OB History    Gravida Para Term Preterm AB Living   3 1 1  0 2 1   SAB TAB Ectopic Multiple Live Births   0 1 1 0         Home Medications    Prior to Admission medications   Medication Sig Start Date End Date Taking? Authorizing Provider  acetaminophen-codeine (TYLENOL #3)  300-30 MG tablet Take 1 tablet by mouth every 4 (four) hours as needed for moderate pain. 01/08/16   Brayton Caves, PA-C  aspirin EC 325 MG EC tablet Take 1 tablet (325 mg total) by mouth daily. 12/27/15   Modena Jansky, MD  Calcium Carbonate-Vitamin D (CALCIUM + D PO) Take 2 tablets by mouth 2 (two) times daily.     Historical Provider, MD  diclofenac (VOLTAREN) 75 MG EC tablet Take 1 tablet (75 mg total) by mouth 2 (two) times daily. 04/30/16   Fransico Meadow, PA-C  diclofenac sodium (VOLTAREN) 1 % GEL Apply 2 g topically 4 (four) times daily. 01/01/16   Lavon Paganini Angiulli, PA-C  levothyroxine (SYNTHROID, LEVOTHROID) 125 MCG tablet Take 1 tablet (125 mcg total) by mouth daily before breakfast. 07/23/16   Maren Reamer, MD  simvastatin (ZOCOR) 20 MG tablet Take 1 tablet (20  mg total) by mouth daily at 6 PM. 07/25/16   Charlett Blake, MD    Family History Family History  Problem Relation Age of Onset  . Bell's palsy Mother   . Cancer Father   . Stroke Father     Social History Social History  Substance Use Topics  . Smoking status: Light Tobacco Smoker    Packs/day: 0.10    Years: 25.00    Types: Cigarettes  . Smokeless tobacco: Never Used     Comment: smoke 2 a day trying to quit  . Alcohol use 0.0 oz/week     Comment: occ     Allergies   Iohexol; Percocet [oxycodone-acetaminophen]; and Tramadol   Review of Systems Review of Systems Ten systems reviewed and are negative for acute change, except as noted in the HPI.    Physical Exam Updated Vital Signs BP 137/91 (BP Location: Right Arm)   Pulse 65   Temp 97.6 F (36.4 C) (Oral)   SpO2 98%   Physical Exam  Constitutional: She is oriented to person, place, and time. She appears well-developed and well-nourished. No distress.  HENT:  Head: Normocephalic and atraumatic.  Eyes: Conjunctivae are normal. No scleral icterus.  Neck: Normal range of motion.  Cardiovascular: Normal rate, regular rhythm and normal heart sounds.  Exam reveals no gallop and no friction rub.   No murmur heard. Pulmonary/Chest: Effort normal and breath sounds normal. No respiratory distress.  Abdominal: Soft. Bowel sounds are normal. She exhibits no distension and no mass. There is no tenderness. There is no guarding.  Musculoskeletal:  Right knee with tenderness above the patella, no bony tenderness, no pain with passive range of motion. Pain when bearing weight, no heat or redness. Atrophy of the right quadriceps and right calf muscles.  Left foot with 3 cm central nodule over the plantar fascia, tender to palpation, mild erythema of the skin without signs of infection, induration or fluctuance, there is a 1 cm nodule just medial to the larger nodule, both are nonmovable, and firmly planted to the tendinous  structures.  Neurological: She is alert and oriented to person, place, and time.  Skin: Skin is warm and dry. She is not diaphoretic.     ED Treatments / Results  DIAGNOSTIC STUDIES: Oxygen Saturation is 98% on RA, normal by my interpretation.  COORDINATION OF CARE: 12:12 PM   Labs (all labs ordered are listed, but only abnormal results are displayed) Labs Reviewed - No data to display  EKG  EKG Interpretation None       Radiology No results found.  Procedures Procedures (including critical care time)  Medications Ordered in ED Medications - No data to display   Initial Impression / Assessment and Plan / ED Course  I have reviewed the triage vital signs and the nursing notes.  Pertinent labs & imaging results that were available during my care of the patient were reviewed by me and considered in my medical decision making (see chart for details).  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 They're firm and movable. Patient may need biopsy, but will definitely need follow-up with podiatry. I have given her a referral to Dr. Amalia Hailey. Patient X-Ray negative for obvious fracture or dislocation. Pain managed in ED. Pt advised to follow up with orthopedics if symptoms persist for possibility of missed fracture diagnosis. Patient given brace while in ED, conservative therapy recommended and discussed. Patient will be dc home & is agreeable with above plan. I personally performed the services described in this documentation, which was scribed in my presence. The recorded information has been reviewed and is accurate.    Final Clinical Impressions(s) / ED Diagnoses   Final diagnoses:  None    New Prescriptions New Prescriptions   No medications on file     Margarita Mail, PA-C 08/28/16 [place], MD 09/03/16 [phone]

## 2016-08-27 NOTE — Discharge Instructions (Addendum)
Please see the podiatrist about your suspected fibromatosis. This is a bening growth in the tendon of the foot. Try icing the foot daily 3 times a day. Wear comfortable shoes with a lot of cushion and see the podiatrist. Your knee xray shows arthritis, but no new injuries. Take the medication I am prescribing and see the orthopedist.

## 2016-08-27 NOTE — ED Notes (Signed)
Patient requests to talk with PA before she leave.  Patient states "I thought I was supposed to get more medicine and an ultrasound".

## 2016-08-27 NOTE — ED Notes (Signed)
Patient given crackers and sprite per request.

## 2016-08-27 NOTE — ED Triage Notes (Addendum)
Pt reports right knee pain and right shoulder pain and growth on left foot on bottom. She states it was just one knot and now 2. Takes no OTC meds. States she has hx of strokes but denies any stroke like symptoms today. States neuro doc told her to ice it but didn't help. States they are painful. She has been told she has arthritis in past.

## 2016-08-27 NOTE — ED Notes (Signed)
Patient to xray with tech.

## 2016-08-27 NOTE — Progress Notes (Signed)
Orthopedic Tech Progress Note Patient Details:  Theresa Watkins Dec 03, 1965 [id number]  Ortho Devices Type of Ortho Device: Crutches, Knee Sleeve Ortho Device/Splint Location: rle Ortho Device/Splint Interventions: Application   Texie Tupou 08/27/2016, 2:45 PM

## 2016-08-30 ENCOUNTER — Encounter (HOSPITAL_COMMUNITY): Payer: Self-pay | Admitting: *Deleted

## 2016-08-30 ENCOUNTER — Ambulatory Visit (HOSPITAL_COMMUNITY)
Admission: EM | Admit: 2016-08-30 | Discharge: 2016-08-30 | Disposition: A | Discharge status: Home / Self Care | Payer: Medicaid Other | Attending: Emergency Medicine | Admitting: Emergency Medicine

## 2016-08-30 DIAGNOSIS — T50A15A Adverse effect of pertussis vaccine, including combinations with a pertussis component, initial encounter: Secondary | ICD-10-CM

## 2016-08-30 DIAGNOSIS — R2231 Localized swelling, mass and lump, right upper limb: Secondary | ICD-10-CM

## 2016-08-30 MED ORDER — NAPROXEN 500 MG PO TABS: 500.0000 mg | ORAL_TABLET | Freq: Two times a day (BID) | ORAL | 0 refills | Status: DC

## 2016-08-30 MED ORDER — CEPHALEXIN 500 MG PO CAPS: 500.0000 mg | ORAL_CAPSULE | Freq: Three times a day (TID) | ORAL | 0 refills | Status: AC

## 2016-08-30 MED ORDER — CETIRIZINE HCL 10 MG PO TABS: 10.0000 mg | ORAL_TABLET | Freq: Every day | ORAL | 0 refills | Status: DC

## 2016-08-30 NOTE — ED Provider Notes (Signed)
HPI  SUBJECTIVE:  Theresa Watkins is a 50 y.o. female who presents with erythema, pain, swelling in her right deltoid after receiving a tetanus shot 3 days ago. States that initially was itchy, followed by hives which have now resolved. She tried a friend's Percocet, using cocaine and Naprosyn 500 mg twice a day for her symptoms. Symptoms are better with cocaine and Percocet. Symptoms are worse with palpation. She denies limitation of motion of her arm, lip swelling, difficulty breathing. No drainage. She is a past medical history of stroke, thyroid cancer, cocaine abuse alcohol abuse. She is a smoker.    Past Medical History:  Diagnosis Date  . Abnormal Pap smear   . Bronchitis   . Fibroid   . Headache(784.0)   . Ovarian cyst   . Seizures ([place])   . Stroke ([place])   . Thyroid cancer ([place]) [address]11  . Trichomonas   . Urinary tract infection     Past Surgical History:  Procedure Laterality Date  . ABDOMINAL HYSTERECTOMY    . CESAREAN SECTION    . goiter     removed  . laparoscopic  1988   removal  of ectopic preg, ruptured tube  . THERAPEUTIC ABORTION    . THYROIDECTOMY  march 2011   cancer    Family History  Problem Relation Age of Onset  . Bell's palsy Mother   . Cancer Father   . Stroke Father     Social History  Substance Use Topics  . Smoking status: Light Tobacco Smoker    Packs/day: 0.10    Years: 25.00    Types: Cigarettes  . Smokeless tobacco: Never Used     Comment: smoke 2 a day trying to quit  . Alcohol use 0.0 oz/week     Comment: occ    No current facility-administered medications for this encounter.   Current Outpatient Prescriptions:  .  acetaminophen-codeine (TYLENOL #3) 300-30 MG tablet, Take 1 tablet by mouth every 4 (four) hours as needed for moderate pain., Disp: 30 tablet, Rfl: 0 .  aspirin EC 325 MG EC tablet, Take 1 tablet (325 mg total) by mouth daily., Disp: 30 tablet, Rfl: 0 .  Calcium Carbonate-Vitamin D (CALCIUM + D PO), Take 2  tablets by mouth 2 (two) times daily. , Disp: , Rfl:  .  cephALEXin (KEFLEX) 500 MG capsule, Take 1 capsule (500 mg total) by mouth 3 (three) times daily., Disp: 21 capsule, Rfl: 0 .  cetirizine (ZYRTEC) 10 MG tablet, Take 1 tablet (10 mg total) by mouth daily., Disp: 30 tablet, Rfl: 0 .  levothyroxine (SYNTHROID, LEVOTHROID) 125 MCG tablet, Take 1 tablet (125 mcg total) by mouth daily before breakfast., Disp: 30 tablet, Rfl: 0 .  naproxen (NAPROSYN) 500 MG tablet, Take 1 tablet (500 mg total) by mouth 2 (two) times daily., Disp: 20 tablet, Rfl: 0 .  simvastatin (ZOCOR) 20 MG tablet, Take 1 tablet (20 mg total) by mouth daily at 6 PM., Disp: 30 tablet, Rfl: 2  Allergies  Allergen Reactions  . Iohexol Hives, Itching and Other (See Comments)     Code: HIVES, Desc: pts tongue began itching post injection and throat burning, Onset Date: [id number]   . Percocet [Oxycodone-Acetaminophen] Rash  . Tramadol Rash     ROS  As noted in HPI.   Physical Exam  BP 153/97 (BP Location: Left Arm)   Pulse 71   Temp 98.6 F (37 C) (Oral)   Resp 16   SpO2 98%  Constitutional: Well developed, well nourished, no acute distress Eyes:  EOMI, conjunctiva normal bilaterally HENT: Normocephalic, atraumatic,mucus membranes moist Respiratory: Normal inspiratory effort Cardiovascular: Normal rate GI: nondistended skin: 9 x 7 cm area mildly tender erythema right deltoid with mild induration. No central fluctuance. Marked area with a marker for reference. Musculoskeletal: no deformities. Patient's baseline range of motion of shoulder intact. Sensation grossly intact distally. Grip strength equal. Neurologic: Alert & oriented x 3, no focal neuro deficits Psychiatric: Speech and behavior appropriate   ED Course   Medications - No data to display  No orders of the defined types were placed in this encounter.   No results found for this or any previous visit (from the past 24 hour(s)). No results  found.  ED Clinical Impression  Adverse effect of diphtheria, tetanus, and pertussis vaccine, initial encounter   ED Assessment/Plan  apepars to be a localized allergic reaction. No evidence of a systemic allergic reaction or infection. Will have patient start ice, Zyrtec, we'll refill her Naprosyn 500 mg to take twice a day. She may also have  a cellulitis, so we'll send home with Keflex 500 mg 3 times a day for 7 days. She'll follow-up with her primary care physician as needed.  Meds ordered this encounter  Medications  . naproxen (NAPROSYN) 500 MG tablet    Sig: Take 1 tablet (500 mg total) by mouth 2 (two) times daily.    Dispense:  20 tablet    Refill:  0  . cetirizine (ZYRTEC) 10 MG tablet    Sig: Take 1 tablet (10 mg total) by mouth daily.    Dispense:  30 tablet    Refill:  0  . cephALEXin (KEFLEX) 500 MG capsule    Sig: Take 1 capsule (500 mg total) by mouth 3 (three) times daily.    Dispense:  21 capsule    Refill:  0    *This clinic note was created using [employer]. Therefore, there may be occasional mistakes despite careful proofreading.  ?   Melynda Ripple, MD 08/30/16 2252

## 2016-08-30 NOTE — Discharge Instructions (Signed)
Apply ice to the area. Continue your Naprosyn. Start the . Zyrtec and the Keflex. Do not take other people's prescriptions. you may have a secondary infection. Follow-up with your doctor as needed.

## 2016-08-30 NOTE — ED Triage Notes (Signed)
Pt received  A  tdap  From  A  Pharmacy  4  Days  Ago  And  Developed  Redness  Pain and    Swelling     To  The  Affected    r  Arm        With  Redness  Present     Pt denys  Any    Previous      Reactions

## 2016-09-11 ENCOUNTER — Ambulatory Visit: Payer: Medicaid Other

## 2016-09-11 ENCOUNTER — Encounter: Payer: Self-pay | Admitting: Podiatry

## 2016-09-11 ENCOUNTER — Ambulatory Visit (INDEPENDENT_AMBULATORY_CARE_PROVIDER_SITE_OTHER): Payer: Medicaid Other | Admitting: Podiatry

## 2016-09-11 ENCOUNTER — Ambulatory Visit (INDEPENDENT_AMBULATORY_CARE_PROVIDER_SITE_OTHER): Payer: Medicaid Other

## 2016-09-11 DIAGNOSIS — M722 Plantar fascial fibromatosis: Secondary | ICD-10-CM

## 2016-09-11 DIAGNOSIS — M79671 Pain in right foot: Secondary | ICD-10-CM

## 2016-09-11 DIAGNOSIS — M21611 Bunion of right foot: Secondary | ICD-10-CM

## 2016-09-11 DIAGNOSIS — M79672 Pain in left foot: Secondary | ICD-10-CM

## 2016-09-11 DIAGNOSIS — M2041 Other hammer toe(s) (acquired), right foot: Secondary | ICD-10-CM

## 2016-09-11 DIAGNOSIS — M2011 Hallux valgus (acquired), right foot: Secondary | ICD-10-CM

## 2016-09-11 NOTE — Progress Notes (Signed)
   Subjective:    Patient ID: Theresa Watkins, female    DOB: [birth year], 50 y.o.   MRN: [id number]  HPI    Review of Systems  HENT: Positive for hearing loss.   Eyes: Positive for visual disturbance.  Genitourinary: Positive for frequency.  Musculoskeletal: Positive for joint swelling.  Allergic/Immunologic: Positive for environmental allergies.  Neurological: Positive for dizziness and headaches.  Hematological: Bruises/bleeds easily.  All other systems reviewed and are negative.      Objective:   Physical Exam        Assessment & Plan:

## 2016-09-12 ENCOUNTER — Telehealth: Payer: Self-pay | Admitting: *Deleted

## 2016-09-12 NOTE — Progress Notes (Signed)
Patient ID: Theresa Watkins, female   DOB: [birth year], 50 y.o.   MRN: [id number] Subjective:  Patient presents today as a referral from the emergency department at [facility] for evaluation of a painful plantar fibroma to the left foot and also painful bunion deformity with crossover second digit to the right foot. Patient states that the right foot is more symptomatic left. Patient states that she has noticed the mass to her left foot for several weeks has been tender to palpation. Patient states that her bunion deformity and crossover second digit deformity of her right foot concern her states that it causes her to trip and fall at times. Patient states that it is painful and CONSERVATIVE treatments including shoe gear modifications and padding have failed to alleviate any symptoms the right foot. Patient presents today for further treatment and evaluation    Objective/Physical Exam General: The patient is alert and oriented x3 in no acute distress.  Dermatology: Skin is warm, dry and supple bilateral lower extremities. Negative for open lesions or macerations.  Vascular: Palpable pedal pulses bilaterally. No edema or erythema noted. Capillary refill within normal limits.  Neurological: Epicritic and protective threshold grossly intact bilaterally.   Musculoskeletal Exam: Large painful palpable mass approximately 4 cm in length by 3 cm with noted to the plantar aspect left foot consistent with a plantar fibroma. Clinical evidence hallux valgus deformity with a palpable prominence of the medial eminence of the first metatarsal noted to the right foot and crossover deformity of the second digit right foot. Range of motion within normal limits to all pedal and ankle joints bilateral. Muscle strength 5/5 in all groups bilateral.   Radiographic Exam:  Normal osseous mineralization. Joint spaces preserved. No fracture/dislocation/boney destruction.    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 #2 today we discussed in detail the conservative versus surgical management of her bunion deformity and hammertoe of the right foot is more symptomatic than her current fibroma on her left foot. #3 conservative management includes anti-inflammatory medication, shoe gear modifications, padding, and conservative modalities to alleviate symptoms of the right foot. #4 after discussing conservative versus surgical management, the patient opted for surgical management due to the painful nature of the deformity, structural deformity, and risk for falls. #5 discussed in detail all possible complications and details regarding surgery. All patient questions were answered. No guarantees were expressed or implied. #6 prior authorization for surgery was initiated today. #7 patient is to return to the clinic in 2 weeks for reevaluation and discussion of surgical versus conservative management.   Dr. Edrick Kins, [place]

## 2016-09-12 NOTE — Telephone Encounter (Signed)
I left a message with patient's mother requesting patient call me back.  I need to inform her that Dr. Amalia Hailey wants to cancel surgery scheduled for 10/03/2016 due to her history of substance abuse.

## 2016-09-25 NOTE — Telephone Encounter (Signed)
Please just advise the patient that we need medical clearance to proceed. Just let her know it is standard procedure for many of our patients. We just want to ensure there are no health risks regarding anesthesia and recovery.  Thanks,  Dr. Amalia Hailey

## 2016-09-25 NOTE — Telephone Encounter (Signed)
I told her already.  She wants to know if you are going to send her out for any test or send her to a doctor to have her evaluated.

## 2016-09-25 NOTE — Telephone Encounter (Signed)
"  I am calling to reschedule my surgery.  My friend's birthday is on that day.  I was not thinking so I would like to reschedule.  We've been playing phone tag."  I was calling to let you know Dr. Amalia Hailey said he's not going to be able to do your surgery until he gets medical clearance from your doctor regarding your history of substance abuse.  "What does that have to do with anything?  I am the one that told him I used something a few weeks ago for pain but that was just a one time thing.  I have been clean for a while now."  Dr. Amalia Hailey wants to ensure you are clean because he doesn't want you to have any reactions to anesthesia and he needs to know what your doctor may recommend for you to use after surgery.  "I don't have a doctor.  So what am I supposed to do?  I have been clean for a while now."  Who was treating you before?  Do you have a primary care doctor?  "I just told you I don't have a doctor.  I don't like going to doctors.  For everything I need I go through the hospital.  So, my question to you again is what do I need to do to show that I do not have drugs in my system?  I think you guys are being very unprofessional."  We have your best interest in mind.  He does not want to put your life in danger.  Precautions have to be taken for your safety.  I will see what he recommends and call you back.

## 2016-09-27 NOTE — Telephone Encounter (Signed)
No tests. I just need medical clearance from a Primary Care Physician. Please send her to a PCP to be evaluated.  Thanks

## 2016-09-30 ENCOUNTER — Ambulatory Visit (INDEPENDENT_AMBULATORY_CARE_PROVIDER_SITE_OTHER): Payer: Medicaid Other | Admitting: Nurse Practitioner

## 2016-09-30 ENCOUNTER — Other Ambulatory Visit: Payer: Self-pay | Admitting: *Deleted

## 2016-09-30 ENCOUNTER — Encounter: Payer: Self-pay | Admitting: Nurse Practitioner

## 2016-09-30 VITALS — BP 133/90 | HR 72 | Ht 65.0 in | Wt 170.4 lb

## 2016-09-30 DIAGNOSIS — Z720 Tobacco use: Secondary | ICD-10-CM | POA: Diagnosis not present

## 2016-09-30 DIAGNOSIS — I69359 Hemiplegia and hemiparesis following cerebral infarction affecting unspecified side: Secondary | ICD-10-CM | POA: Diagnosis not present

## 2016-09-30 DIAGNOSIS — E785 Hyperlipidemia, unspecified: Secondary | ICD-10-CM | POA: Diagnosis not present

## 2016-09-30 DIAGNOSIS — I639 Cerebral infarction, unspecified: Secondary | ICD-10-CM | POA: Diagnosis not present

## 2016-09-30 NOTE — Progress Notes (Signed)
I reviewed above note and agree with the assessment and plan.  Rosalin Hawking, MD PhD Stroke Neurology 09/30/2016 6:47 PM

## 2016-09-30 NOTE — Progress Notes (Signed)
[facility]  PATIENT: Theresa Watkins DOB: January 14, 1966    REASON FOR VISIT: Follow-up for stroke that occurred in February 2017 lacunar infarct HISTORY FROM: Patient    HISTORY OF PRESENT ILLNESS:UPDATE 11/13/2017CM Theresa Watkins, 50 year old female returns for follow-up. She has a history of  small hemorrhagic left basal ganglia infarct which occurred in February 2017. She is currently on aspirin for secondary stroke prevention without recurrent stroke or TIA symptoms. She has no bruising and no bleeding .She continues to complain of visual problems she has not  seen an ophthalmologist as suggested. She continues to have some weakness on the right side she claims she has just gotten her Medicaid. Physical therapy has been ordered for her previously. She is continuing to smoke 2 cigarettes a day and denies alcohol or cocaine drug use. She remains on Zocor. She returns for reevaluation  UPDATE 07/13/2017CM  Theresa Watkins, 50 year old female returns for follow-up last seen in the office by Dr. Leonie Man 02/28/2016 for hospital follow-up for stroke. She remains on aspirin and Zocor for secondary stroke prevention without recurrent stroke or TIA symptoms. She does complain of blurred vision today and she has not seen an ophthalmologist in over 30 years. She also complains of right shoulder pain, seen in the emergency room on 04/30/2016. Right shoulder x-ray with mild degenerative joint disease. She had therapies ordered when last seen however she just got her Medicaid  Yesterday so she will follow-up for some strengthening exercises. She continues to smoke and was encouraged to quit she also has a history of cocaine use. She denies any recent cocaine use. She complains with headaches today  posterior headaches which she describes as moderate in severity pulling sensation and appears to be more pronounced with neck movements.and says the only thing that helps his Vicodin. She was made aware we  do not prescribe narcotic medications for headaches. She does not want to be on a preventive. She also complains of vaginal bleeding and was made aware she needs to see OB/GYN. Her speech has improved. She returns for reevaluation   HISTORY 02/28/16 PS49 year African-American lady seen today for the first office follow-up visit for hospital admission for stroke in February 2017. Theresa Watkins is an 50 y.o. female with a history of hypertension, thyroid cancer and cocaine use, as well as reported history of stroke 7 years ago, presenting with new onset right facial and upper extremity weakness, as well as slurred speech. Patient also has had several falls over the past couple of days. She also admits to use of cocaine last night. Urine drug screen was positive for cocaine. There is an equivocal history of transient right lower extremity weakness. Information obtained from the patient is somewhat unreliable. CT scan of her head was unremarkable with no acute intracranial abnormality. NIH stroke score was 8. Her last known well was unclear. Patient was not administered TPA secondary to unknown last known well. She was admitted for further evaluation and treatment. Initial CT scan of the head on admission showed no definite infarct. Patient did not tolerate having an MRI done. Repeat CT scan of the head showed a small hemorrhagic left basal ganglia infarct. Transthoracic echo showed normal ejection fraction with a to left septal aneurysm with possibly a small patent foramen ovale. Carotid ultrasound showed no significant extracranial stenosis. Urine drug screen was positive for cocaine. Hemoglobin A1c was 5.6. LDL cholesterol was 119 and total cholesterol 181. Patient was counseled to quit smoking cigarettes and cocaine. She  was started on Zocor for hyperlipidemia and aspirin. She went to inpatient rehabilitation and subsequently has been at home is received inpatient therapy but outpatient therapy has not yet  been started. She states her speech has improved though at times she still has trouble speaking completing sentences. She also has significant right hand weakness with diminished fine motor skills. Her right leg also tracks that time. She also has some posterior headaches which he describes as moderate in severity pulling sensation and appears to be more pronounced with neck movements. She is tolerating aspirin without bleeding or bruising and Zocor without muscle aches or pains. She quit smoking for a month but has recently restarted but smokes only a quarter pack per day. She states she is stop doing cocaine.  REVIEW OF SYSTEMS: Full 14 system review of systems performed and notable only for those listed, all others are neg:  Constitutional: neg  Cardiovascular: neg Ear/Nose/Throat: neg  Skin: neg Eyes: Blurred vision Respiratory: neg Gastroitestinal: neg  Genitourinary neg Hematology/Lymphatic: Easy bruising Endocrine: neg Musculoskeletal: Right shoulder pain, walking difficulty Allergy/Immunology: neg Neurological: neg Psychiatric: Decreased concentration Sleep : neg   ALLERGIES: Allergies  Allergen Reactions  . Iohexol Hives, Itching and Other (See Comments)     Code: HIVES, Desc: pts tongue began itching post injection and throat burning, Onset Date: [id number]   . Proanthocyanidin Swelling    Swelling of the tongue  . Diphenhydramine Hcl Rash  . Percocet [Oxycodone-Acetaminophen] Rash  . Tramadol Rash    HOME MEDICATIONS: Outpatient Medications Prior to Visit  Medication Sig Dispense Refill  . acetaminophen-codeine (TYLENOL #3) 300-30 MG tablet Take 1 tablet by mouth every 4 (four) hours as needed for moderate pain. 30 tablet 0  . aspirin EC 325 MG EC tablet Take 1 tablet (325 mg total) by mouth daily. 30 tablet 0  . Calcium Carbonate-Vitamin D (CALCIUM + D PO) Take 2 tablets by mouth 2 (two) times daily.     . cetirizine (ZYRTEC) 10 MG tablet Take 1 tablet (10 mg total)  by mouth daily. 30 tablet 0  . levothyroxine (SYNTHROID, LEVOTHROID) 125 MCG tablet Take 1 tablet (125 mcg total) by mouth daily before breakfast. 30 tablet 0  . naproxen (NAPROSYN) 500 MG tablet Take 1 tablet (500 mg total) by mouth 2 (two) times daily. 20 tablet 0  . simvastatin (ZOCOR) 20 MG tablet Take 1 tablet (20 mg total) by mouth daily at 6 PM. 30 tablet 2   No facility-administered medications prior to visit.     PAST MEDICAL HISTORY: Past Medical History:  Diagnosis Date  . Abnormal Pap smear   . Bronchitis   . Fibroid   . Headache(784.0)   . Ovarian cyst   . Seizures ([place])   . Stroke ([place])   . Thyroid cancer ([place]) [address]11  . Trichomonas   . Urinary tract infection     PAST SURGICAL HISTORY: Past Surgical History:  Procedure Laterality Date  . ABDOMINAL HYSTERECTOMY    . CESAREAN SECTION    . goiter     removed  . laparoscopic  1988   removal  of ectopic preg, ruptured tube  . THERAPEUTIC ABORTION    . THYROIDECTOMY  march 2011   cancer    FAMILY HISTORY: Family History  Problem Relation Age of Onset  . Bell's palsy Mother   . Cancer Father   . Stroke Father     SOCIAL HISTORY: Social History   Social History  . Marital status: Divorced  Spouse name: N/A  . Number of children: N/A  . Years of education: N/A   Occupational History  . Not on file.   Social History Main Topics  . Smoking status: Light Tobacco Smoker    Packs/day: 0.10    Years: 25.00    Types: Cigarettes  . Smokeless tobacco: Never Used     Comment: smoke 2 a day trying to quit  . Alcohol use 0.0 oz/week     Comment: occ  . Drug use:     Types: Cocaine     Comment: 1-2x monthly  . Sexual activity: Not Currently    Birth control/ protection: Surgical   Other Topics Concern  . Not on file   Social History Narrative  . No narrative on file     PHYSICAL EXAM  Vitals:   09/30/16 1418  BP: 133/90  Pulse: 72  Weight: 170 lb 6.4 oz (77.3 kg)  Height: 5\' 5"  (1.651  m)   Body mass index is 28.36 kg/m. General: Middle-aged African-American lady, seated, in no evident distress Head: head normocephalic and atraumatic.  Neck: supple with no carotid bruits Cardiovascular: regular rate and rhythm, no murmurs Musculoskeletal: no deformity. Right shoulder movements limited by pain Skin: no rash/petichiae Vascular: Normal pulses all extremities  Neurological examination   Mental Status: Awake and fully alert. Oriented to place and time. Recent and remote memory intact. Attention span, concentration and fund of knowledge appropriate. Mood and affect appropriate.  Cranial Nerves: Fundoscopic exam reveals sharp disc margins. Visual acuity 20/70 right 20/100 left. Pupils equal, briskly reactive to light. Extraocular movements full without nystagmus. Visual fields full to confrontation. Hearing intact. Facial sensation intact. Face, tongue, palate moves normally and symmetrically.  Motor: Mild right hemiparesis. Right shoulder elevation limited due to pain. 4/5 right-sided weakness with weakness of right grip, intrinsic hand muscles and right hip flexors and ankle dorsiflexors. Tone is slightly increased on the right compared to left.  Sensory.: intact to touch ,pinprick .position and vibratory sensation on the left decreased vibratory on the right other modalities normal on the right.  Coordination: Rapid alternating movements normal in all extremities. Finger-to-nose and heel-to-shin performed accurately bilaterally. Gait and Station: Arises from chair without difficulty. Stance is normal. Gait demonstrates hemiplegic gait with dragging of the right foot with stiffness and mild spasticity  Reflexes: 1+ and symmetric. Toes downgoing.  DIAGNOSTIC DATA (LABS, IMAGING, TESTING) - I reviewed patient records, labs, notes, testing and imaging myself where available.  Lab Results  Component Value Date   WBC 5.5 12/28/2015   HGB 14.9 12/28/2015   HCT 44.9  12/28/2015   MCV 86.2 12/28/2015   PLT 173 12/28/2015      Component Value Date/Time   NA 140 12/28/2015 0616   K 4.1 12/28/2015 0616   CL 106 12/28/2015 0616   CO2 24 12/28/2015 0616   GLUCOSE 94 12/28/2015 0616   BUN 11 12/28/2015 0616   CREATININE 0.79 12/28/2015 0616   CALCIUM 8.7 (L) 12/28/2015 0616   PROT 6.3 (L) 12/28/2015 0616   ALBUMIN 3.4 (L) 12/28/2015 0616   AST 15 12/28/2015 0616   ALT 11 (L) 12/28/2015 0616   ALKPHOS 58 12/28/2015 0616   BILITOT 0.5 12/28/2015 0616   GFRNONAA >60 12/28/2015 0616   GFRAA >60 12/28/2015 0616   Lab Results  Component Value Date   CHOL 228 (H) 12/26/2015   HDL 62 12/26/2015   LDLCALC 135 (H) 12/26/2015   TRIG 157 (H) 12/26/2015  CHOLHDL 3.7 12/26/2015   Lab Results  Component Value Date   HGBA1C 5.8 (H) 12/26/2015   No results found for: [id number] Lab Results  Component Value Date   TSH 18.683 (H) 12/26/2015      ASSESSMENT AND PLAN 50 year old African-American lady with small left basal ganglia hemorrhagic infarct in therapy 2017 due to 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 The patient is a current patient of Dr. Leonie Man who is out of the office today . This note is sent to the work in doctor.     PLANContinue aspirin 325 mg daily for secondary stroke prevention  maintain strict control of hypertension with blood pressure goal below 130/90,  todays reading 133/90 lipids with LDL cholesterol goal below 70 mg/dL. Continue Zocor Eat a healthy diet with plenty of whole grains, cereals, fruits and vegetables,  Rehab services for 4/5 right-sided weakness with weakness of right grip, intrinsic hand muscles and right hip flexors and ankle dorsiflexors. Need to follow up with eye doctor regarding vision Follow up here in 6 months, if stable will dismiss Dennie Bible, [facility], [facility], APRN  [facility] [address], [place], [place] [address] [phone]

## 2016-09-30 NOTE — Patient Instructions (Addendum)
Continue aspirin 325 mg daily for secondary stroke prevention  maintain strict control of hypertension with blood pressure goal below 130/90,  todays reading 133/90 lipids with LDL cholesterol goal below 70 mg/dL. Continue Zocor Eat a healthy diet with plenty of whole grains, cereals, fruits and vegetables,  Rehab services for 4/5 right-sided weakness with weakness of right grip, intrinsic hand muscles and right hip flexors and ankle dorsiflexors. Need to follow up with eye doctor regarding vision Follow up here in 6 months

## 2016-10-06 ENCOUNTER — Emergency Department (HOSPITAL_COMMUNITY)
Admission: EM | Admit: 2016-10-06 | Discharge: 2016-10-06 | Disposition: A | Discharge status: Home / Self Care | Payer: Medicaid Other | Attending: Emergency Medicine | Admitting: Emergency Medicine

## 2016-10-06 ENCOUNTER — Encounter (HOSPITAL_COMMUNITY): Payer: Self-pay

## 2016-10-06 DIAGNOSIS — E038 Other specified hypothyroidism: Secondary | ICD-10-CM | POA: Diagnosis not present

## 2016-10-06 DIAGNOSIS — Z7982 Long term (current) use of aspirin: Secondary | ICD-10-CM | POA: Diagnosis not present

## 2016-10-06 DIAGNOSIS — F17210 Nicotine dependence, cigarettes, uncomplicated: Secondary | ICD-10-CM | POA: Diagnosis not present

## 2016-10-06 DIAGNOSIS — Z79899 Other long term (current) drug therapy: Secondary | ICD-10-CM | POA: Insufficient documentation

## 2016-10-06 DIAGNOSIS — Z85850 Personal history of malignant neoplasm of thyroid: Secondary | ICD-10-CM | POA: Diagnosis not present

## 2016-10-06 DIAGNOSIS — Z8673 Personal history of transient ischemic attack (TIA), and cerebral infarction without residual deficits: Secondary | ICD-10-CM | POA: Diagnosis not present

## 2016-10-06 DIAGNOSIS — R531 Weakness: Secondary | ICD-10-CM | POA: Diagnosis present

## 2016-10-06 LAB — BASIC METABOLIC PANEL
Anion gap: 8 (ref 5–15)
BUN: 17 mg/dL (ref 6–20)
CALCIUM: 8.6 mg/dL — AB (ref 8.9–10.3)
CHLORIDE: 107 mmol/L (ref 101–111)
CO2: 24 mmol/L (ref 22–32)
CREATININE: 0.99 mg/dL (ref 0.44–1.00)
GFR calc Af Amer: >60 mL/min (ref >60)
Glucose, Bld: 70 mg/dL (ref 65–99)
Potassium: 4.1 mmol/L (ref 3.5–5.1)
SODIUM: 139 mmol/L (ref 135–145)

## 2016-10-06 LAB — CBC
HCT: 45.6 % (ref 36.0–46.0)
Hemoglobin: 15 g/dL (ref 12.0–15.0)
MCH: 27.6 pg (ref 26.0–34.0)
MCHC: 32.9 g/dL (ref 30.0–36.0)
MCV: 84 fL (ref 78.0–100.0)
PLATELETS: 190 10*3/uL (ref 150–400)
RBC: 5.43 MIL/uL — AB (ref 3.87–5.11)
RDW: 13.9 % (ref 11.5–15.5)
WBC: 5.4 10*3/uL (ref 4.0–10.5)

## 2016-10-06 LAB — TSH: TSH: 2.479 u[IU]/mL (ref 0.350–4.500)

## 2016-10-06 MED ORDER — NAPROXEN 500 MG PO TABS: 500.0000 mg | ORAL_TABLET | Freq: Two times a day (BID) | ORAL | 0 refills | Status: DC

## 2016-10-06 MED ORDER — LEVOTHYROXINE SODIUM 125 MCG PO TABS: 125.0000 ug | ORAL_TABLET | Freq: Every day | ORAL | 2 refills | Status: DC

## 2016-10-06 NOTE — ED Triage Notes (Signed)
Patient here requesting her synthroid medication. States she ha been out 1 week.  Now complaining of fatigue and weakness with same. Alert and oriented, NAD. Has also ben taking every other day for 1 week because she had only 3 pills left

## 2016-10-06 NOTE — ED Provider Notes (Signed)
[facility] DEPT Provider Note   CSN: [id number] Arrival date & time: 10/06/16  1333     History   Chief Complaint Chief Complaint  Patient presents with  . out of synthroid/ dizzy/weakness    HPI ANACAROLINA DOMINEY is a 50 y.o. female.  Patient is a 50 year old female who presents for a refill request on her Synthroid. She states she's been out of her Synthroid for about a week. Prior to that she was taking it every other day because she was running low. She states when she runs out of her Synthroid she tends to get fatigued. She's having these symptoms now. In triage, she reported dizziness and was noted to have an elevated blood pressure. However she states that they took her blood pressure while they were drawing blood. She also states to me that she's not having any dizziness but that she's only feeling more tired than normal. She states these are the typical symptoms that she gets when she stops her Synthroid. She denies any chest pain or palpitations. No shortness of breath. No fevers or other recent illnesses. No urinary symptoms.      Past Medical History:  Diagnosis Date  . Abnormal Pap smear   . Bronchitis   . Fibroid   . Headache(784.0)   . Ovarian cyst   . Seizures ([place])   . Stroke ([place])   . Thyroid cancer ([place]) [address]  . Trichomonas   . Urinary tract infection     Patient Active Problem List   Diagnosis Date Noted  . Tooth pain   . CVA (cerebral infarction) 12/27/2015  . Hypothyroidism due to acquired atrophy of thyroid   . HLD (hyperlipidemia)   . Hemiparesis affecting dominant side as late effect of stroke ([place])   . 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 Tobacco abuse   . Cocaine abuse   . ETOH abuse   . Hypothyroidism (acquired) 12/24/2015  . Acute ischemic stroke ([place]) 12/24/2015  . Stroke ([place]) 12/24/2015  . Drug abuse, cocaine type 05/09/2014    Past Surgical History:  Procedure Laterality Date  . ABDOMINAL  HYSTERECTOMY    . CESAREAN SECTION    . goiter     removed  . laparoscopic  1988   removal  of ectopic preg, ruptured tube  . THERAPEUTIC ABORTION    . THYROIDECTOMY  march 2011   cancer    OB History    Gravida Para Term Preterm AB Living   3 1 1  0 2 1   SAB TAB Ectopic Multiple Live Births   0 1 1 0         Home Medications    Prior to Admission medications   Medication Sig Start Date End Date Taking? Authorizing Provider  acetaminophen-codeine (TYLENOL #3) 300-30 MG tablet Take 1 tablet by mouth every 4 (four) hours as needed for moderate pain. 01/08/16   Brayton Caves, PA-C  aspirin EC 325 MG EC tablet Take 1 tablet (325 mg total) by mouth daily. 12/27/15   Modena Jansky, MD  Calcium Carbonate-Vitamin D (CALCIUM + D PO) Take 2 tablets by mouth 2 (two) times daily.     Historical Provider, MD  cetirizine (ZYRTEC) 10 MG tablet Take 1 tablet (10 mg total) by mouth daily. 08/30/16   Melynda Ripple, MD  levothyroxine (SYNTHROID, LEVOTHROID) 125 MCG tablet Take 1 tablet (125 mcg total) by mouth daily before breakfast. 10/06/16   Malvin Johns, MD  naproxen (NAPROSYN) 500  MG tablet Take 1 tablet (500 mg total) by mouth 2 (two) times daily. 10/06/16   Malvin Johns, MD  simvastatin (ZOCOR) 20 MG tablet Take 1 tablet (20 mg total) by mouth daily at 6 PM. 07/25/16   Charlett Blake, MD    Family History Family History  Problem Relation Age of Onset  . Bell's palsy Mother   . Cancer Father   . Stroke Father     Social History Social History  Substance Use Topics  . Smoking status: Light Tobacco Smoker    Packs/day: 0.10    Years: 25.00    Types: Cigarettes  . Smokeless tobacco: Never Used     Comment: smoke 2 a day trying to quit  . Alcohol use 0.0 oz/week     Comment: occ     Allergies   Iohexol; Proanthocyanidin; Diphenhydramine hcl; Percocet [oxycodone-acetaminophen]; and Tramadol   Review of Systems Review of Systems  Constitutional: Positive for fatigue.  Negative for chills, diaphoresis and fever.  HENT: Negative for congestion, rhinorrhea and sneezing.   Eyes: Negative.   Respiratory: Negative for cough, chest tightness and shortness of breath.   Cardiovascular: Negative for chest pain and leg swelling.  Gastrointestinal: Negative for abdominal pain, blood in stool, diarrhea, nausea and vomiting.  Genitourinary: Negative for difficulty urinating, flank pain, frequency and hematuria.  Musculoskeletal: Negative for arthralgias and back pain.  Skin: Negative for rash.  Neurological: Negative for dizziness, speech difficulty, weakness, numbness and headaches.     Physical Exam Updated Vital Signs BP 151/96 (BP Location: Right Arm)   Pulse 68   Temp 98 F (36.7 C) (Oral)   Resp 21   SpO2 97%   Physical Exam  Constitutional: She is oriented to person, place, and time. She appears well-developed and well-nourished.  HENT:  Head: Normocephalic and atraumatic.  Eyes: Pupils are equal, round, and reactive to light.  Neck: Normal range of motion. Neck supple.  Cardiovascular: Normal rate, regular rhythm and normal heart sounds.   Pulmonary/Chest: Effort normal and breath sounds normal. No respiratory distress. She has no wheezes. She has no rales. She exhibits no tenderness.  Abdominal: Soft. Bowel sounds are normal. There is no tenderness. There is no rebound and no guarding.  Musculoskeletal: Normal range of motion. She exhibits no edema.  No edema or calf tenderness  Lymphadenopathy:    She has no cervical adenopathy.  Neurological: She is alert and oriented to person, place, and time.  Skin: Skin is warm and dry. No rash noted.  Psychiatric: She has a normal mood and affect.     ED Treatments / Results  Labs (all labs ordered are listed, but only abnormal results are displayed) Labs Reviewed  CBC - Abnormal; Notable for the following:       Result Value   RBC 5.43 (*)    All other components within normal limits  BASIC  METABOLIC PANEL - Abnormal; Notable for the following:    Calcium 8.6 (*)    All other components within normal limits  TSH    EKG  EKG Interpretation  Date/Time:  Sunday October 06 2016 15:40:26 EST Ventricular Rate:  74 PR Interval:    QRS Duration: 100 QT Interval:  485 QTC Calculation: 539 R Axis:   29 Text Interpretation:  Sinus rhythm LAE, consider biatrial enlargement Prolonged QT interval since last tracing no significant change T wave inversion seen on prior EKG has resolved Confirmed by Zaya Kessenich  MD, Shametra Cumberland ([id number]) on 10/06/2016 4:00:49  PM       Radiology No results found.  Procedures Procedures (including critical care time)  Medications Ordered in ED Medications - No data to display   Initial Impression / Assessment and Plan / ED Course  I have reviewed the triage vital signs and the nursing notes.  Pertinent labs & imaging results that were available during my care of the patient were reviewed by me and considered in my medical decision making (see chart for details).  Clinical Course     Patient presents for refill on her medication. She states her symptoms of fatigue that she's currently having is typical when she's off her Synthroid. She thought she was going to fast track just to get a refill on her medication but since she reported dizziness and weakness on triage, labs were ordered. Her labs are non-concerning. She was given a refill on her Synthroid as well as Naprosyn which she also requested. She was given a list of outpatient resources for possible follow-up.  Final Clinical Impressions(s) / ED Diagnoses   Final diagnoses:  Other specified hypothyroidism    New Prescriptions Current Discharge Medication List       Malvin Johns, MD 10/06/16 1606

## 2016-10-06 NOTE — ED Notes (Addendum)
Patient states she is here for a medication refill.  Patient denies any other complaints.  Patient ambulated from lobby to treatment without difficulty.

## 2016-10-07 ENCOUNTER — Encounter: Payer: Self-pay | Admitting: Occupational Therapy

## 2016-10-07 DIAGNOSIS — M25511 Pain in right shoulder: Secondary | ICD-10-CM

## 2016-10-07 NOTE — Therapy (Signed)
[facility] [address] [place], [place], [address] Phone: [phone]   Fax:  [phone]  Occupational Therapy Treatment  Patient Details  Name: Theresa Watkins MRN: [id number] Date of Birth: 07/12/1966 Referring Provider: Dr. Antony Contras  Encounter Date: 10/07/2016    Past Medical History:  Diagnosis Date  . Abnormal Pap smear   . Bronchitis   . Fibroid   . Headache(784.0)   . Ovarian cyst   . Seizures ([place])   . Stroke ([place])   . Thyroid cancer ([place]) [address]  . Trichomonas   . Urinary tract infection     Past Surgical History:  Procedure Laterality Date  . ABDOMINAL HYSTERECTOMY    . CESAREAN SECTION    . goiter     removed  . laparoscopic  1988   removal  of ectopic preg, ruptured tube  . THERAPEUTIC ABORTION    . THYROIDECTOMY  march 2011   cancer    There were no vitals filed for this visit.                              OT Short Term Goals - 03/19/16 1518      OT SHORT TERM GOAL #1   Title Pt will be mod I with HEP- 04/16/2016   Baseline dependent   Status New     OT SHORT TERM GOAL #2   Title Pt will report no more than 6/10 shoulder pain with shoulder flexion greater than 90* in supine   Baseline 10/10 at 70*   Status New     OT SHORT TERM GOAL #3   Title Pt will demonstrate improved grip strength by at least 5 pounds in R dominant hand to assist with functional tasks.   Baseline baseline = 15   Status New     OT SHORT TERM GOAL #4   Title Pt will be able to use RUE for low to mid reach functonal tasks with min compensations.   Baseline Pt currently using LUE for almost all functional tasks.    Status New           OT Long Term Goals - 03/19/16 1528      OT LONG TERM GOAL #1   Title Pt will be mod I wth upgraded HEP - 05/14/2016   Baseline dependent   Status New     OT LONG TERM GOAL #2   Title Pt will report pain no greater than 3/10 with at  least 90* of shoulder flexion in sitting and standing   Baseline 70* with 10/10   Status New     OT LONG TERM GOAL #3   Title Pt will demonstrate at least 8 pounds of increased grip strength to assist wtih functional tasks   Baseline 15 pounds   Status New     OT LONG TERM GOAL #4   Title Pt will demonstrate ability to use RUE as dominant for basic ADL tasks   Baseline Using LUE as dominant for ADL's at this time.   Status New             Patient will benefit from skilled therapeutic intervention in order to improve the following deficits and impairments:     Visit Diagnosis: Acute pain of right shoulder    Problem List Patient Active Problem List   Diagnosis Date Noted  . Tooth pain   . CVA (cerebral infarction) 12/27/2015  .  Hypothyroidism due to acquired atrophy of thyroid   . HLD (hyperlipidemia)   . Hemiparesis affecting dominant side as late effect of stroke ([place])   . 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 Tobacco abuse   . Cocaine abuse   . ETOH abuse   . Hypothyroidism (acquired) 12/24/2015  . Acute ischemic stroke ([place]) 12/24/2015  . Stroke ([place]) 12/24/2015  . Drug abuse, cocaine type 05/09/2014   Evaluation was completed however pt never returned for any treatment visits.  Will discharge pt at this time.  Quay Burow, OTR/L 10/07/2016, 3:23 PM  [facility] [address] [place], [place], [address] Phone: [phone]   Fax:  [phone]  Name: Theresa Watkins MRN: [id number] Date of Birth: 1966/08/14

## 2016-10-17 ENCOUNTER — Ambulatory Visit: Payer: Medicaid Other

## 2016-10-17 ENCOUNTER — Ambulatory Visit: Payer: Medicaid Other | Admitting: *Deleted

## 2016-10-24 ENCOUNTER — Ambulatory Visit: Payer: Medicaid Other | Attending: Nurse Practitioner | Admitting: Rehabilitative and Restorative Service Providers"

## 2016-10-24 ENCOUNTER — Telehealth: Payer: Self-pay | Admitting: *Deleted

## 2016-10-24 ENCOUNTER — Encounter: Payer: Self-pay | Admitting: *Deleted

## 2016-10-24 ENCOUNTER — Ambulatory Visit: Payer: Medicaid Other | Admitting: *Deleted

## 2016-10-24 DIAGNOSIS — M25511 Pain in right shoulder: Secondary | ICD-10-CM

## 2016-10-24 DIAGNOSIS — M6281 Muscle weakness (generalized): Secondary | ICD-10-CM | POA: Diagnosis present

## 2016-10-24 DIAGNOSIS — R278 Other lack of coordination: Secondary | ICD-10-CM | POA: Insufficient documentation

## 2016-10-24 DIAGNOSIS — I69359 Hemiplegia and hemiparesis following cerebral infarction affecting unspecified side: Secondary | ICD-10-CM

## 2016-10-24 DIAGNOSIS — R2689 Other abnormalities of gait and mobility: Secondary | ICD-10-CM | POA: Insufficient documentation

## 2016-10-24 DIAGNOSIS — R2681 Unsteadiness on feet: Secondary | ICD-10-CM | POA: Insufficient documentation

## 2016-10-24 NOTE — Patient Instructions (Addendum)
Hip Flexion / Knee Extension: Straight-Leg Raise (Eccentric)   Lie on back. Lift leg with knee straight. Slowly lower leg for 3-5 seconds. _10_ reps per set, _2__ sets per day.  ABDUCTION: Side-Lying (Active)   Lie on left side, top leg straight. Raise top leg as far as possible. . Complete _2__ sets of _10__ repetitions. Perform __2_ sessions per day.  [email or web address].[email or web address]   Achilles / Gastroc, Standing   Stand, right foot behind, heel on floor and turned slightly out, leg straight, forward leg bent. Move hips forward. Hold _30__ seconds. Repeat _3__ times per session. Do _3__ sessions per day.

## 2016-10-24 NOTE — Therapy (Signed)
[facility] [address] [place], [place], [address] Phone: [phone]   Fax:  [phone]  Occupational Therapy Evaluation  Patient Details  Name: Theresa Watkins MRN: [id number] Date of Birth: February 11, 1966 Referring Provider: Dr Leonie Man  Encounter Date: 10/24/2016      OT End of Session - 10/24/16 1249    Visit Number 1   Number of Visits 4   Date for OT Re-Evaluation 12/05/16   Authorization Type Medicaid/MCD - requesting Eval + 3 additional visits for OT over next 6 weeks   OT Start Time 1104   OT Stop Time 1152   OT Time Calculation (min) 48 min   Behavior During Therapy Impulsive;WFL for tasks assessed/performed  Easily distractable      Past Medical History:  Diagnosis Date  . Abnormal Pap smear   . Bronchitis   . Fibroid   . Headache(784.0)   . Ovarian cyst   . Seizures ([place])   . Stroke ([place])   . Thyroid cancer ([place]) 20011  . Trichomonas   . Urinary tract infection     Past Surgical History:  Procedure Laterality Date  . ABDOMINAL HYSTERECTOMY    . CESAREAN SECTION    . goiter     removed  . laparoscopic  1988   removal  of ectopic preg, ruptured tube  . THERAPEUTIC ABORTION    . THYROIDECTOMY  march 2011   cancer    There were no vitals filed for this visit.      Subjective Assessment - 10/24/16 1238    Subjective  Pt reports "I am in pain all day every day" "I need pain medication, what they give me doesn't work" and it upsets her stomach. 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 She cont to experience decreased AROM & stiffness RUE. She is RHD.   Patient Stated Goals "I'd like to be able to use my arm better" for ADL's and light home making tasks.   Currently in Pain? Yes   Pain Score 10-Worst pain ever  Pt reports 10/10 pain however oes not appear in this much distress in clinic today. Easily distracted and implusive during assessment.  Talking, smiling, laughing throughout, no facial grimmace, no heavy/pursed lip breathing or holding of breath etc.   Pain Location --  Entire Right side   Pain Orientation Right   Pain Type Chronic pain   Pain Onset More than a month ago   Pain Frequency Constant   Aggravating Factors  In pain all day, worse upon waking   Pain Relieving Factors Medication (however it upsets her stomach) and she "needs something stronger"            [facility] OT Assessment - 10/24/16 1115      Assessment   Diagnosis Left BG CVA w/ right sided weakness    Referring Provider Dr Leonie Man   Onset Date 12/07/15   Assessment Pt was on in-pt Rehab and discharged on 01/02/16. She had therapy as described below.   Prior Therapy PT/OT/SLP for [facility] & then out-pt rehab - see EPIC, pt was Self Pay at this time and waiting on medicaid approval.     Precautions   Precautions Fall   Precaution Comments Pt reports 15+ falss in last 6 months     Restrictions   Weight Bearing Restrictions No     Balance Screen   Has the patient fallen in the past 6 months  Yes   How many times? 15+ times in last 6 months   Has the patient had a decrease in activity level because of a fear of falling?  Yes   Is the patient reluctant to leave their home because of a fear of falling?  Yes     Home  Environment   Family/patient expects to be discharged to: Private residence   Living Arrangements Alone   Available Help at Discharge Family  Pt mother comes by every 3 days or so   Type of Home --  Townhouse   Home Access Level entry   Home Layout Two level  Pt has bedroom upstairs   Bathroom Shower/Tub Tub/Shower unit;Curtain   Engineer, mining;Bedside commode   Additional Comments Pt reports that she does not have any DME   Lives With Alone  Pt states that a friend is currently living on her couch, bu     Prior Function   Level of Independence Independent   Vocation  Part time employment   Vocation Requirements Part time employment     ADL   Eating/Feeding Independent   Grooming Modified independent  Uses L hand for tasks that are difficult with right   Upper Body Bathing Modified independent   Lower Body Bathing Increased time   Upper Body Dressing --  Modified independent   Lower Body Dressing Modified independent   [place] Transfer Independent   ADL comments Pt is overall Independent/Mod I for basic functional ADL's. She lives alone and repotrs that she requires increased time for tasks and R shoulder lack of active motion inhibits some ADL's. P/ROM is WFL's and pt performs other activities using left hand if she is unable to use right.     IADL   Shopping Assistance for transportation  Shops once a month/independently   Light Housekeeping --  Once a month laundromat   Prior Level of Function Meal Prep Independent   Meal Prep Plans, prepares and serves adequate meals independently;Able to complete simple warm meal prep   Medication Management Is responsible for taking medication in correct dosages at correct time  Meds are in weekly container   Financial Management Manages financial matters independently (budgets, writes checks, pays rent, bills goes to bank), collects and keeps track of income  Mother assists w/ bill pay "If I dont have money"     Mobility   Mobility Status History of falls     Written Expression   Dominant Hand Right     Vision - History   Baseline Vision Wears glasses only for reading   Additional Comments Pt used readers before nad cont to do so now. She reports ocassional blurry vision, but readers help.     Vision Assessment   Eye Alignment Within Functional Limits     Activity Tolerance   Activity Tolerance Tolerate 30+ min activity without fatigue     Cognition   Overall Cognitive Status  Difficult to assess  Pt was highly distractable, impulsive during assessment   Behaviors Restless;Impulsive;Perseveration  Peseverating on need for pain medication & doctor appts     Sensation   Light Touch Impaired Detail   Light Touch Impaired Details Impaired RUE   Semmes Weinstein Monofilament Scale Diminshed Light Touch   Additional Comments Pt reports she doesn't have as good sensibility in R hand  as she does the left     Coordination   Gross Motor Movements are Fluid and Coordinated No   Fine Motor Movements are Fluid and Coordinated No   9 Hole Peg Test Right;Left   Right 9 Hole Peg Test 37.47 seconds   Left 9 Hole Peg Test 25.57 seconds   Box and Blocks R = 37 vs L = 61   Coordination Pt reports that her RUE fatigues easily     ROM / Strength   AROM / PROM / Strength AROM;Strength     AROM   Overall AROM  Deficits   Overall AROM Comments RUE with A/ROM shoulder ABD approximately 80*; Flexion ~76*. A/AROM and PROM is WFL's for shoulder flexion. Pt requested that therapist call her doctors office at this time for an orthopedic assessment of her RUE/shoulder as she "never followed up when I was supposed to last time" last May 2017 - pt was unable to perform MRI when she was in-pt due to claustrophobia and did not follow up as planned after her discharge. "I need to make and appointment for that"     Strength   Overall Strength Deficits     Hand Function   Right Hand Gross Grasp Functional   Right Hand Grip (lbs) 16#   Right Hand Lateral Pinch 7.5 lbs   Right Hand 3 Point Pinch 5.5 lbs   Left Hand Gross Grasp Functional   Left Hand Grip (lbs) 61#   Left Hand Lateral Pinch 16 lbs   Left 3 point pinch 11 lbs   Comment Functiona lgrip on right hand,, pt fatigues easily. She uses her left non-dominant hand for most activities since her CVA 11/2015. R mainly as assist or for light low level activity secondary to lack of shoulder A/ROM     Functional Reaching Activities   Low  Level RUE WFL's for table top and low level tasks   Mid Level Impaired; functional for ADL's   High Level Unable - P/ROM WFL's, No A/ROM above 76* shoulfer flexion                         OT Education - 10/24/16 1246    Education Details Role of OT, assessment/findings & recommendations for out-pt Evaluation plus 3 additional visits to be requested for OT.   Person(s) Educated Patient   Comprehension Verbalized understanding;Verbal cues required;Need further instruction  Pt appeared confused about medicaid coverage initially thinking that she would have unlimited visits/coverage.          OT Short Term Goals - 10/24/16 1307      OT SHORT TERM GOAL #1   Title STG's = LTG's  STG's = LTG's - defer all old STG's      OT SHORT TERM GOAL #2   Title STG's = LTG's     OT SHORT TERM GOAL #3   Title STG's = LTG's     OT SHORT TERM GOAL #4   Title STG's = LTG's           OT Long Term Goals - 10/24/16 1310      OT LONG TERM GOAL #1   Title Pt will be mod I wth HEP RUE (12/06/15)   Baseline dependent   Time 6   Period Weeks   Status New     OT LONG TERM GOAL #2   Title Pt will report pain no greater than 4/10 with at least 85* of shoulder flexion  in sitting and standing   Baseline 10/24/16 76* pt rates pain as 10/10 at rest and with activity   Time 6   Period Weeks   Status New     OT LONG TERM GOAL #3   Title Pt will demonstrate at least 5 pounds of increased grip strength to assist wtih functional tasks   Baseline 10/24/16 = RUE 16#   Time 6   Period Weeks   Status New     OT LONG TERM GOAL #4   Title Pt will demonstrate ability to use RUE as dominant UE for basic ADL tasks   Baseline 10/24/16: Currently uses LUE as dominant for ADL's at this time & R as assist.   Time 6   Period Weeks   Status New     OT LONG TERM GOAL #5   Title Pt will demonstrate improved coordinaiton RUE as seen by improved 9 Hole Peg Test Score to less than 30 seconds.    Baseline 10/24/16: RUE 37.47 seconds   Time 6   Period Weeks   Status New               Plan - 10/24/16 1251    Clinical Impression Statement Pt is a 50 y/o RHD female s/p Left BG CVA (ICD #163.9) on 12/07/2015. She was hospitialized from 12/07/15-01/02/16 following an in-pt Rehab stay. She was discharged home alone with family/mother checking in on her several times a week.  PMH includes HTN, thyroid cancer, polysubstance abuse including alcohol & cocaine. She is reporting 15 or more falls over the last 6 months. She initially was self-pay at hospital d/c and now has [employer]. Deficits currently include pain, decreased RUE A/ROM, coordination, strength, blurry vision at times, and overall decreased functional use impacting her ability to perform ADL's & IADL's using her R dominant UE.Marland Kitchen   Rehab Potential Fair   Clinical Impairments Affecting Rehab Potential H/O polysubstance abuse; decreased ability for carry over of new learning   OT Frequency --  Eval plus 3 additional visits over next 6 weeks, due to possible insurance restrictions)   OT Duration 6 weeks   OT Treatment/Interventions Self-care/ADL training;DME and/or AE instruction;Patient/family education;Therapeutic exercises;Therapeutic exercise;Therapeutic activities;Neuromuscular education;Functional Mobility Training;Visual/perceptual remediation/compensation   Plan Issue home program for RUE coordination and putty as tolerated. Assess pain.   Recommended Other Services PT secondary to high fall risk; h/o falls   Consulted and Agree with Plan of Care Patient      Patient will benefit from skilled therapeutic intervention in order to improve the following deficits and impairments:  Decreased coordination, Decreased range of motion, Impaired sensation, Decreased safety awareness, Decreased endurance, Decreased activity tolerance, Pain, Impaired UE functional use, Decreased knowledge of use of DME, Decreased balance, Decreased  cognition, Decreased strength, Impaired vision/preception  Visit Diagnosis: Muscle weakness (generalized) - Plan: Ot plan of care cert/re-cert  Hemiparesis affecting dominant side as late effect of stroke ([place]) - Plan: Ot plan of care cert/re-cert  Other lack of coordination - Plan: Ot plan of care cert/re-cert  Right shoulder pain, unspecified chronicity - Plan: Ot plan of care cert/re-cert    Problem List Patient Active Problem List   Diagnosis Date Noted  . Tooth pain   . CVA (cerebral infarction) 12/27/2015  . Hypothyroidism due to acquired atrophy of thyroid   . HLD (hyperlipidemia)   . Hemiparesis affecting dominant side as late effect of stroke ([place])   . 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  Tobacco abuse   . Cocaine abuse   . ETOH abuse   . Hypothyroidism (acquired) 12/24/2015  . Acute ischemic stroke ([place]) 12/24/2015  . Stroke ([place]) 12/24/2015  . Drug abuse, cocaine type 05/09/2014    Percell Miller Ardath Sax, OTR/L 10/24/2016, 1:24 PM  [facility] [address] [place], [place], [address] Phone: [phone]   Fax:  [phone]  Name: Theresa Watkins MRN: [id number] Date of Birth: 05/02/1966

## 2016-10-25 NOTE — Telephone Encounter (Signed)
LFt vm for patient that letter for clearance for surgery will be left at [facility] front desk in envelope. Patient did not leave fax number for Dr.Evans

## 2016-10-25 NOTE — Therapy (Signed)
[facility] [address] [place], [place], [address] Phone: [phone]   Fax:  [phone]  Physical Therapy Evaluation  Patient Details  Name: Theresa Watkins MRN: [id number] Date of Birth: [birth year] Referring Provider: Antony Contras, MD  Encounter Date: 10/24/2016      PT End of Session - 10/24/16 2127    Visit Number 1   Number of Visits 4   Date for PT Re-Evaluation 11/23/16   Authorization Type Medicaid, however beyond 6 months post stroke- m-caid recently established/uncertain of coverage   PT Start Time 1015   PT Stop Time 1100   PT Time Calculation (min) 45 min   Activity Tolerance Patient tolerated treatment well   Behavior During Therapy Impulsive;WFL for tasks assessed/performed      Past Medical History:  Diagnosis Date  . Abnormal Pap smear   . Bronchitis   . Fibroid   . Headache(784.0)   . Ovarian cyst   . Seizures ([place])   . Stroke ([place])   . Thyroid cancer ([place]) 20011  . Trichomonas   . Urinary tract infection     Past Surgical History:  Procedure Laterality Date  . ABDOMINAL HYSTERECTOMY    . CESAREAN SECTION    . goiter     removed  . laparoscopic  1988   removal  of ectopic preg, ruptured tube  . THERAPEUTIC ABORTION    . THYROIDECTOMY  march 2011   cancer    There were no vitals filed for this visit.       Subjective Assessment - 10/24/16 1017    Subjective The patient is s/p CVA 12/24/2015.  She underwent IP rehab and then [facility] PT/OT/ST.  She then came to OP PT in 03/2016 and was under self pay and chose not to attend therapy at that time.  She recently got approved for m-caid.  She is coming to PT today reporting pain is getting worse and she wakes up in pain.  She notes issues t/o the R side with decreased sensation, weakness, and pain.     Patient Stated Goals "My dream is to open a restaurant", "be able to walk better and lift my arm up overhead"; "I can't walk by myself"  (notes hills and she is unable)   Currently in Pain? Yes   Pain Score 10-Worst pain ever  11-12 rated   Pain Location --  entire right side   Pain Orientation Right   Pain Descriptors / Indicators Burning;Sharp;Discomfort   Pain Type Chronic pain   Pain Onset More than a month ago   Pain Frequency Constant   Aggravating Factors  in pain all day, worse upon waking;    Pain Relieving Factors medication (however it upsets her stomach)            [facility] PT Assessment - 10/24/16 1031      Assessment   Medical Diagnosis L CVA   Referring Provider Antony Contras, MD   Onset Date/Surgical Date 12/24/15   Hand Dominance Right   Prior Therapy evaluated OP in 03/2016, but self pay.  Was doing [place] prior to that.     Precautions   Precautions Fall     Restrictions   Weight Bearing Restrictions No     Balance Screen   Has the patient fallen in the past 6 months Yes   How many times? 15+   Has the patient had a decrease in activity level because of a fear of falling?  Yes  Is the patient reluctant to leave their home because of a fear of falling?  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     Posture/Postural Control   Posture/Postural Control No significant limitations     ROM / Strength   AROM / PROM  / Strength AROM;Strength     AROM   Overall AROM Comments R UE AROM significantly limited- see OT eval.  L UE WFLs.       Strength   Strength Assessment Site Hip;Knee;Ankle   Right/Left Hip Right;Left   Right Hip Flexion 3-/5   Right Hip ABduction 3-/5   Left Hip Flexion 5/5   Right/Left Knee Right;Left   Right Knee Flexion 4/5   Right Knee Extension 3/5   Left Knee Flexion 5/5   Left Knee Extension 5/5   Right/Left Ankle Right;Left   Right Ankle Dorsiflexion 3+/5   Left Ankle Dorsiflexion 5/5     Transfers   Transfers Sit to Stand;Stand to Sit   Sit to Stand 6: Modified independent (Device/Increase time)   Stand to Sit 6: Modified independent (Device/Increase time)     Ambulation/Gait   Ambulation/Gait Yes   Ambulation/Gait Assistance 5: Supervision   Ambulation Distance (Feet) 200 Feet   Assistive device None   Gait Pattern Step-through pattern;Wide base of support  bilateral int. rotation, [place] gait pattern   Ambulation Surface Level   Gait velocity 2.9 ft/sec   Stairs Yes   Stairs Assistance 6: Modified independent (Device/Increase time)   Stair Management Technique One rail Right;Alternating pattern   Number of Stairs 4     Standardized Balance Assessment   Standardized Balance Assessment Berg Balance Test     Berg Balance Test   Sit to Stand Able to stand  independently using hands   Standing Unsupported Able to stand safely 2 minutes   Sitting with Back Unsupported but Feet Supported on Floor or Stool Able to sit safely and securely 2 minutes   Stand to Sit Controls descent by using hands   Transfers Able to transfer safely, definite need of hands   Standing Unsupported with Eyes Closed Able to stand 10 seconds with supervision   Standing Ubsupported with Feet Together Able to place feet together independently and stand for 1 minute with supervision   From Standing, Reach Forward with Outstretched Arm Can reach forward >12 cm safely (5")   From  Standing Position, Pick up Object from Floor Able to pick up shoe, needs supervision   From Standing Position, Turn to Look Behind Over each Shoulder Looks behind from both sides and weight shifts well   Turn 360 Degrees Needs close supervision or verbal cueing   Standing Unsupported, Alternately Place Feet on Step/Stool Able to  complete 4 steps without aid or supervision   Standing Unsupported, One Foot in [place] to take small step independently and hold 30 seconds   Standing on One Leg Tries to lift leg/unable to hold 3 seconds but remains standing independently   Total Score 39   Berg comment: 39/56 indicating high fall risk       THERAPEUTIC EXERCISE: HEP established for LE strengthening-see patient instructions.  *no charge submitted as eval date for medicaid and no treatment covered.          PT Long Term Goals - 10/24/16 2140      PT LONG TERM GOAL #1   Title The patient will be independent with HEP for LE weakness. (Target date 11/24/2016)    Baseline The patient has LE weakness and no current HEP.   Time 4   Period Weeks   Status New     PT LONG TERM GOAL #2   Title The patient will improve Berg to > or equal to 44/56 to demo dec'ing risk for falls. (Target date 11/24/2016)   Baseline Berg=39/56   Time 4   Period Weeks   Status New     PT LONG TERM GOAL #3   Title The patient will improve gait speed from 2.90 ft/sec to > or equal to 3.4 ft/sec to demo improving functional mobility. (Target date 11/24/2016)   Baseline Gait speed=2.9 ft/sec   Time 4   Period Weeks   Status New     PT LONG TERM GOAL #4   Title The patient will subjectively report pain R LE < or equal to 7/10. (Target date 11/24/2016)   Baseline Patient notes "11-12/10" pain level.   Time 4   Period Weeks   Status New               Plan - 10/24/16 2150    Clinical Impression Statement The patient is a 50 year old female s/p CVA on 12/24/2015 with hemiparesis affecting dominant side (G81.91).   Patient hospitalized until 01/02/2016 and then underwent [facility] PT.  She presents to OP PT with R side weakness, imbalance, gait abnormalities associated with hemiparesis.  PT to address deficits to optimize current functional status.    Rehab Potential Good   Clinical Impairments Affecting Rehab Potential Insurance limitations, patient's abiltiy to carryover to home.   PT Frequency 2x / week   PT Duration 4 weeks   PT Treatment/Interventions ADLs/Self Care Home Management;Therapeutic exercise;Therapeutic activities;Functional mobility training;Gait training;Balance training;Neuromuscular re-education;Patient/family education   PT Next Visit Plan Check HEP and progress for balance, strengthening, core stability   Consulted and Agree with Plan of Care Patient      Patient will benefit from skilled therapeutic intervention in order to improve the following deficits and impairments:  Abnormal gait, Decreased balance, Decreased mobility, Difficulty walking, Decreased strength  Visit Diagnosis: Other abnormalities of gait and mobility  Unsteadiness on feet  Muscle weakness (generalized)     Problem List Patient Active Problem List   Diagnosis Date Noted  . Tooth pain   . CVA (cerebral infarction) 12/27/2015  . Hypothyroidism due to acquired atrophy of thyroid   . HLD (hyperlipidemia)   . Hemiparesis affecting dominant side as late effect of stroke ([place])   . 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 Tobacco abuse   . Cocaine abuse   . ETOH abuse   . Hypothyroidism (acquired) 12/24/2015  . Acute ischemic stroke ([place]) 12/24/2015  . Stroke [facility])  12/24/2015  . Drug abuse, cocaine type 05/09/2014    Song Garris 10/25/2016, 9:53 AM  [facility] [address] [place], [place], [address] Phone: [phone]   Fax:  [phone]  Name: Theresa Watkins MRN: [id number] Date of Birth: [birth year]

## 2016-11-27 ENCOUNTER — Emergency Department (HOSPITAL_COMMUNITY): Payer: Medicaid Other

## 2016-11-27 ENCOUNTER — Encounter (HOSPITAL_COMMUNITY): Payer: Self-pay

## 2016-11-27 DIAGNOSIS — Z85850 Personal history of malignant neoplasm of thyroid: Secondary | ICD-10-CM | POA: Insufficient documentation

## 2016-11-27 DIAGNOSIS — E039 Hypothyroidism, unspecified: Secondary | ICD-10-CM | POA: Diagnosis not present

## 2016-11-27 DIAGNOSIS — Z7982 Long term (current) use of aspirin: Secondary | ICD-10-CM | POA: Diagnosis not present

## 2016-11-27 DIAGNOSIS — R079 Chest pain, unspecified: Secondary | ICD-10-CM | POA: Diagnosis not present

## 2016-11-27 DIAGNOSIS — F17210 Nicotine dependence, cigarettes, uncomplicated: Secondary | ICD-10-CM | POA: Insufficient documentation

## 2016-11-27 DIAGNOSIS — Z8673 Personal history of transient ischemic attack (TIA), and cerebral infarction without residual deficits: Secondary | ICD-10-CM | POA: Diagnosis not present

## 2016-11-27 LAB — CBC
HCT: 45.2 % (ref 36.0–46.0)
HEMOGLOBIN: 15 g/dL (ref 12.0–15.0)
MCH: 27.7 pg (ref 26.0–34.0)
MCHC: 33.2 g/dL (ref 30.0–36.0)
MCV: 83.4 fL (ref 78.0–100.0)
Platelets: 202 10*3/uL (ref 150–400)
RBC: 5.42 MIL/uL — ABNORMAL HIGH (ref 3.87–5.11)
RDW: 13.6 % (ref 11.5–15.5)
WBC: 6.2 10*3/uL (ref 4.0–10.5)

## 2016-11-27 LAB — BASIC METABOLIC PANEL
ANION GAP: 9 (ref 5–15)
BUN: 8 mg/dL (ref 6–20)
CALCIUM: 8.7 mg/dL — AB (ref 8.9–10.3)
CO2: 23 mmol/L (ref 22–32)
CREATININE: 0.78 mg/dL (ref 0.44–1.00)
Chloride: 108 mmol/L (ref 101–111)
GFR calc Af Amer: >60 mL/min (ref >60)
GFR calc non Af Amer: >60 mL/min (ref >60)
GLUCOSE: 93 mg/dL (ref 65–99)
Potassium: 3.6 mmol/L (ref 3.5–5.1)
Sodium: 140 mmol/L (ref 135–145)

## 2016-11-27 LAB — I-STAT TROPONIN, ED: TROPONIN I, POC: 0 ng/mL (ref 0.00–0.08)

## 2016-11-27 NOTE — ED Triage Notes (Signed)
Pt comes c/o of CP that started three days ago and and L arm numbness and pain that started yesterday. Pt also c/o of SOB/n/v/ dizziness. Pt also adds that she is having increased salvation.

## 2016-11-28 ENCOUNTER — Emergency Department (HOSPITAL_COMMUNITY)
Admission: EM | Admit: 2016-11-28 | Discharge: 2016-11-28 | Disposition: A | Discharge status: Home / Self Care | Payer: Medicaid Other | Attending: Emergency Medicine | Admitting: Emergency Medicine

## 2016-11-28 DIAGNOSIS — R079 Chest pain, unspecified: Secondary | ICD-10-CM

## 2016-11-28 LAB — I-STAT TROPONIN, ED: Troponin i, poc: 0 ng/mL (ref 0.00–0.08)

## 2016-11-28 MED ORDER — GI COCKTAIL ~~LOC~~
30.0000 mL | Freq: Once | ORAL | Status: AC
  Administered 2016-11-28: 30 mL via ORAL
  Filled 2016-11-28: qty 30

## 2016-11-28 MED ORDER — ONDANSETRON 4 MG PO TBDP
8.0000 mg | ORAL_TABLET | Freq: Once | ORAL | Status: AC
  Administered 2016-11-28: 8 mg via ORAL
  Filled 2016-11-28: qty 2

## 2016-11-28 MED ORDER — OMEPRAZOLE 20 MG PO CPDR: 20.0000 mg | DELAYED_RELEASE_CAPSULE | Freq: Every day | ORAL | 0 refills | Status: DC

## 2016-11-28 NOTE — ED Provider Notes (Signed)
[facility] DEPT Provider Note   CSN: [id number] Arrival date & time: 11/27/16  2147   By signing my name below, I, Delton Prairie, attest that this documentation has been prepared under the direction and in the presence of Jola Schmidt, MD  Electronically Signed: Delton Prairie, ED Scribe. 11/28/16. 3:04 AM.   History   Chief Complaint Chief Complaint  Patient presents with  . Chest Pain   The history is provided by the patient. No language interpreter was used.   HPI Comments:  Theresa Watkins is a 51 y.o. female, with a hx of stroke with a deficit of right sided weakness, who presents to the Emergency Department complaining of sudden onset, intermittent chest pain x 3 days. She notes her pain became constant today. She describes her pain as an aching, burning and sharp sensation. Her pain is better when she lays on her right side. Pt also reports upper abdominal pain, tingling to her left upper extremity and resolved SOB. Pt denies hx of heart disease. She states she was 56 when she had her stroke and was told it was related to cocaine use. Pt is a smoker. She denies recent/current cocaine use.   Past Medical History:  Diagnosis Date  . Abnormal Pap smear   . Bronchitis   . Fibroid   . Headache(784.0)   . Ovarian cyst   . Seizures ([place])   . Stroke ([place])   . Thyroid cancer ([place]) 20011  . Trichomonas   . Urinary tract infection     Patient Active Problem List   Diagnosis Date Noted  . Tooth pain   . CVA (cerebral infarction) 12/27/2015  . Hypothyroidism due to acquired atrophy of thyroid   . HLD (hyperlipidemia)   . Hemiparesis affecting dominant side as late effect of stroke ([place])   . 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 Tobacco abuse   . Cocaine abuse   . ETOH abuse   . Hypothyroidism (acquired) 12/24/2015  . Acute ischemic stroke ([place]) 12/24/2015  . Stroke ([place]) 12/24/2015  . Drug abuse, cocaine type 05/09/2014    Past Surgical History:    Procedure Laterality Date  . ABDOMINAL HYSTERECTOMY    . CESAREAN SECTION    . goiter     removed  . laparoscopic  1988   removal  of ectopic preg, ruptured tube  . THERAPEUTIC ABORTION    . THYROIDECTOMY  march 2011   cancer    OB History    Gravida Para Term Preterm AB Living   3 1 1  0 2 1   SAB TAB Ectopic Multiple Live Births   0 1 1 0         Home Medications    Prior to Admission medications   Medication Sig Start Date End Date Taking? Authorizing Provider  aspirin EC 325 MG EC tablet Take 1 tablet (325 mg total) by mouth daily. 12/27/15  Yes Modena Jansky, MD  Calcium Carbonate-Vitamin D (CALCIUM + D PO) Take 2 tablets by mouth 2 (two) times daily.    Yes Historical Provider, MD  cetirizine (ZYRTEC) 10 MG tablet Take 1 tablet (10 mg total) by mouth daily. 08/30/16  Yes Melynda Ripple, MD  levothyroxine (SYNTHROID, LEVOTHROID) 125 MCG tablet Take 1 tablet (125 mcg total) by mouth daily before breakfast. 10/06/16  Yes Malvin Johns, MD  simvastatin (ZOCOR) 20 MG tablet Take 1 tablet (20 mg total) by mouth daily at 6 PM. 07/25/16  Yes Luanna Salk  Kirsteins, MD  acetaminophen-codeine (TYLENOL #3) 300-30 MG tablet Take 1 tablet by mouth every 4 (four) hours as needed for moderate pain. Patient not taking: Reported on 11/28/2016 01/08/16   Brayton Caves, PA-C  naproxen (NAPROSYN) 500 MG tablet Take 1 tablet (500 mg total) by mouth 2 (two) times daily. Patient not taking: Reported on 11/28/2016 10/06/16   Malvin Johns, MD    Family History Family History  Problem Relation Age of Onset  . Bell's palsy Mother   . Cancer Father   . Stroke Father     Social History Social History  Substance Use Topics  . Smoking status: Light Tobacco Smoker    Packs/day: 0.10    Years: 25.00    Types: Cigarettes  . Smokeless tobacco: Never Used     Comment: smoke 2 a day trying to quit  . Alcohol use 0.0 oz/week     Comment: occ     Allergies   Iohexol; Proanthocyanidin;  Diphenhydramine hcl; Percocet [oxycodone-acetaminophen]; and Tramadol   Review of Systems Review of Systems 10 systems reviewed and all are negative for acute change except as noted in the HPI.  Physical Exam Updated Vital Signs BP 126/91   Pulse 77   Temp 98.7 F (37.1 C) (Oral)   Resp 20   Ht 5\' 6"  (1.676 m)   Wt 197 lb (89.4 kg)   SpO2 99%   BMI 31.80 kg/m   Physical Exam  Constitutional: She is oriented to person, place, and time. She appears well-developed and well-nourished. No distress.  HENT:  Head: Normocephalic and atraumatic.  Eyes: EOM are normal.  Neck: Normal range of motion.  Cardiovascular: Normal rate, regular rhythm and normal heart sounds.   Pulmonary/Chest: Effort normal and breath sounds normal.  Abdominal: Soft. She exhibits no distension. There is no tenderness.  Musculoskeletal: Normal range of motion.  Neurological: She is alert and oriented to person, place, and time.  Skin: Skin is warm and dry.  Psychiatric: She has a normal mood and affect. Judgment normal.  Nursing note and vitals reviewed.    ED Treatments / Results  DIAGNOSTIC STUDIES:  Oxygen Saturation is 99% on RA, normal by my interpretation.    COORDINATION OF CARE:  3:04 AM Discussed treatment plan with pt at bedside and pt agreed to plan.  Labs (all labs ordered are listed, but only abnormal results are displayed) Labs Reviewed  BASIC METABOLIC PANEL - Abnormal; Notable for the following:       Result Value   Calcium 8.7 (*)    All other components within normal limits  CBC - Abnormal; Notable for the following:    RBC 5.42 (*)    All other components within normal limits  I-STAT TROPOININ, ED  I-STAT TROPOININ, ED    EKG  EKG Interpretation  Date/Time:  Wednesday November 27 2016 21:58:06 EST Ventricular Rate:  82 PR Interval:  140 QRS Duration: 86 QT Interval:  448 QTC Calculation: 523 R Axis:   44 Text Interpretation:  Normal sinus rhythm Biatrial  enlargement Septal infarct , age undetermined Prolonged QT Abnormal ECG No significant change was found Confirmed by Venora Maples  MD, Lennette Bihari ([id number]) on 11/28/2016 2:49:18 AM       Radiology Dg Chest 2 View  Result Date: 11/27/2016 CLINICAL DATA:  Chest pain EXAM: CHEST  2 VIEW COMPARISON:  Chest radiograph 12/26/2015 FINDINGS: Cardiomediastinal contours are normal. No focal airspace consolidation or pulmonary edema. Small right pleural effusion. No pneumothorax. IMPRESSION: Small right  pleural effusion without focal airspace disease or pulmonary edema. Electronically Signed   By: Ulyses Jarred M.D.   On: 11/27/2016 23:02    Procedures Procedures (including critical care time)  Medications Ordered in ED Medications  ondansetron (ZOFRAN-ODT) disintegrating tablet 8 mg (not administered)  gi cocktail (Maalox,Lidocaine,Donnatal) (30 mLs Oral Given 11/28/16 0350)     Initial Impression / Assessment and Plan / ED Course  I have reviewed the triage vital signs and the nursing notes.  Pertinent labs & imaging results that were available during my care of the patient were reviewed by me and considered in my medical decision making (see chart for details).  Clinical Course     Patient be discharged home in good condition is time.  Doubt ACS.  Doubt PE.  Likely gastroesophageal reflux disease.    Final Clinical Impressions(s) / ED Diagnoses   Final diagnoses:  Chest pain, unspecified type    New Prescriptions New Prescriptions   OMEPRAZOLE (PRILOSEC) 20 MG CAPSULE    Take 1 capsule (20 mg total) by mouth daily.     I personally performed the services described in this documentation, which was scribed in my presence. The recorded information has been reviewed and is accurate.        Jola Schmidt, MD 11/28/16 [phone]

## 2016-12-30 ENCOUNTER — Other Ambulatory Visit: Payer: Self-pay | Admitting: *Deleted

## 2016-12-30 DIAGNOSIS — I639 Cerebral infarction, unspecified: Secondary | ICD-10-CM

## 2017-01-06 ENCOUNTER — Ambulatory Visit: Payer: Medicaid Other | Attending: Nurse Practitioner | Admitting: Occupational Therapy

## 2017-01-06 DIAGNOSIS — R278 Other lack of coordination: Secondary | ICD-10-CM | POA: Insufficient documentation

## 2017-01-06 DIAGNOSIS — M6281 Muscle weakness (generalized): Secondary | ICD-10-CM

## 2017-01-06 DIAGNOSIS — I69351 Hemiplegia and hemiparesis following cerebral infarction affecting right dominant side: Secondary | ICD-10-CM | POA: Insufficient documentation

## 2017-01-06 DIAGNOSIS — G8929 Other chronic pain: Secondary | ICD-10-CM | POA: Diagnosis present

## 2017-01-06 DIAGNOSIS — M25511 Pain in right shoulder: Secondary | ICD-10-CM | POA: Diagnosis present

## 2017-01-06 NOTE — Patient Instructions (Signed)
1. Grip Strengthening (Resistive Putty)  Red Putty   Squeeze putty using thumb and all fingers.  Make it fat again.  Then squeeze putty again using thumb and all fingers.   Repeat _20___ times. Do __2__ sessions per day.   2. Roll putty into a long tube on table.  Pinch along the tube using your index finger, your middle finger and your thumb.       Copyright  VHI. All rights reserved.

## 2017-01-06 NOTE — Therapy (Signed)
Theresa Watkins [address] [place], [place], [address] Phone: [phone]   Fax:  [phone]  Occupational Therapy Treatment  Patient Details  Name: Theresa Watkins MRN: [id number] Date of Birth: [birth year] Referring Provider: Dr. Evlyn Courier  Encounter Date: 01/06/2017      OT End of Session - 01/06/17 1740    Visit Number 1   Number of Visits 4  Eval plus 3 visits   Date for OT Re-Evaluation 02/07/17  to allow for medicaid approval   Authorization Type Medicaid/MCD - requesting Eval + 3 additional visits for OT over next 6 weeks   OT Start Time 1017   OT Stop Time 1100   OT Time Calculation (min) 43 min   Activity Tolerance Patient tolerated treatment well      Past Medical History:  Diagnosis Date  . Abnormal Pap smear   . Bronchitis   . Fibroid   . Headache(784.0)   . Ovarian cyst   . Seizures ([place])   . Stroke ([place])   . Thyroid cancer ([place]) 20011  . Trichomonas   . Urinary tract infection     Past Surgical History:  Procedure Laterality Date  . ABDOMINAL HYSTERECTOMY    . CESAREAN SECTION    . goiter     removed  . laparoscopic  1988   removal  of ectopic preg, ruptured tube  . THERAPEUTIC ABORTION    . THYROIDECTOMY  march 2011   cancer    There were no vitals filed for this visit.      Subjective Assessment - 01/06/17 1031    Subjective  I need all three therapies but I guess only get one eval and three visits.    Pertinent History see epic   Patient Stated Goals I need all three therapies   Currently in Pain? Yes   Pain Score 9    Pain Location Shoulder   Pain Orientation Right   Pain Descriptors / Indicators Stabbing;Sore   Pain Type Chronic pain   Pain Onset More than a month ago   Pain Frequency Constant   Aggravating Factors  have no idea is there all the time.   Pain Relieving Factors taking aspirin helps   Multiple Pain Sites Yes   Pain Score 7   Pain Location Back    Pain Orientation Lower;Right   Pain Descriptors / Indicators Aching;Sore   Pain Type Chronic pain   Pain Onset More than a month ago   Pain Frequency Constant   Aggravating Factors  weather, rain   Pain Relieving Factors nothing, sleep   Pain Score 8   Pain Location Head   Pain Orientation Right   Pain Descriptors / Indicators Aching   Pain Type Chronic pain   Pain Onset More than a month ago   Pain Frequency Constant   Aggravating Factors  when I can't see like now   Pain Relieving Factors sleep            [facility] OT Assessment - 01/06/17 0001      Assessment   Diagnosis L BG CVA   Referring Provider Dr. Evlyn Courier   Onset Date 12/07/15   Assessment Inpt rehab   Prior Therapy PT, OT, [facility] for [facility] and inpt rehab last year     Precautions   Precautions Fall   Precaution Comments Pt aware that she could use one time eval for PT and stated "I lose my balance but I don't actually fall.  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     ADL   Eating/Feeding Independent   Grooming Modified independent   Upper Body Bathing Modified independent   Lower Body Bathing Increased time   Lower Body Dressing Modified independent   Engineering geologist - Water engineer -  Development worker, community Independent    ADL comments Pt uses L hand more now than she did before     IADL   [place] for transportation   Meal Prep Plans, prepares and serves adequate meals independently   Medication Management Is responsible for taking medication in correct dosages at correct time   Physiological scientist financial matters independently (budgets, writes checks, pays rent, bills goes to bank), collects and keeps track of income     Mobility   Mobility Status History of falls     Written Expression   Dominant Hand Right   Handwriting 50% legible  for name     Vision - History   Baseline Vision Wears glasses all the time     Vision Assessment   Eye Alignment Within Functional Limits     Activity Tolerance   Activity Tolerance Tolerate 30+ min activity without fatigue     Cognition   Overall Cognitive Status Within Functional Limits for tasks assessed   Mini Mental State Exam  Pt with long term drug use and appears to have some difficulty with attention, problem solving, judgement.      Sensation   Light Touch Appears Intact   Hot/Cold Impaired by gross assessment  pt very poor reporter appears impaired per pt report   Proprioception Appears Intact     Coordination   Gross Motor Movements are Fluid and Coordinated No   Fine Motor Movements are Fluid and Coordinated No   9 Hole Peg Test Right;Left   Right 9 Hole Peg Test 37.75   Left 9 Hole Peg Test 20.86   Box and Blocks L=53 R=50     Tone   Assessment Location Right Upper Extremity     ROM / Strength   AROM / PROM / Strength AROM;PROM;Strength     AROM   Overall AROM  Deficits   Overall AROM Comments R shoulder flexion to approximately 45*, abduction to 40* pt then limited by pain and strength.  Pt has full ROM for elbow to hand as well as isolated hand function.       PROM   Overall PROM  Within functional limits for tasks performed     Strength   Overall Strength Deficits     Hand Function   Right Hand Gross  Grasp Impaired   Right Hand Grip (lbs) 30   Left Hand Gross Grasp Functional   Left  Hand Grip (lbs) 45     RUE Tone   RUE Tone Within Functional Limits                          OT Education - 01/06/17 1731    Education provided Yes   Education Details putty for grip strength and pinch   Person(s) Educated Patient   Methods Explanation;Demonstration;Verbal cues;Handout   Comprehension Verbalized understanding;Returned demonstration          OT Short Term Goals - 10/24/16 1307      OT SHORT TERM GOAL #1   Title STG's = LTG's  STG's = LTG's - defer all old STG's      OT SHORT TERM GOAL #2   Title STG's = LTG's     OT SHORT TERM GOAL #3   Title STG's = LTG's     OT SHORT TERM GOAL #4   Title STG's = LTG's           OT Long Term Goals - 01/06/17 1731      OT LONG TERM GOAL #1   Title Pt will be mod I with HEP for RUE strength, coordination and grip strength   Baseline dependent   Status New     OT LONG TERM GOAL #2   Title Pt will demonstrate improved grip strength by 5 pounds to assist with functional tasks.   Baseline 30 pounds   Status New     OT LONG TERM GOAL #3   Title Pt will be able to write name with 100% legibility using AE prn   Baseline 50% legibilty for name only   Status New               Plan - 01/06/17 1733    Clinical Impression Statement Pt is a a 51 year old female s/p L BG CVA (ICD 163.9) on 12/07/2015.  Pt was hospitalized from 12/07/2015-01/02/2016 following inpt rehab stay.  SH eas discharged home with her mother check in on her frequently.  PMH: HTN, thyroid cancer, polysubstance abuse including alcohol and cocoaine. Pt presents today with the following impairments that impact her ability to participate in IADL, leisure and work:  R dominant hemplegia, pain in R shoulder, decreased strength, decreased grip strength, decreased coordination.  Pt will beneift from skilled OT to address these issues to increase her  abilty to participate in activities and to use her RUE more functionally.    Rehab Potential Fair   Clinical Impairments Affecting Rehab Potential H/O polysubstance abuse; decreased ability for carry over of new learning   OT Frequency 1x / week  starting with first scheduled visit for OT treatment   OT Duration 4 weeks   OT Treatment/Interventions Self-care/ADL training;DME and/or AE instruction;Patient/family education;Therapeutic exercises;Therapeutic exercise;Therapeutic activities;Neuromuscular education;Functional Mobility Training;Visual/perceptual remediation/compensation   Plan add to HEP for coordination and RUE proximal strengthening in supine, NMR and manual to address shoulder pain.    Consulted and Agree with Plan of Care Patient      Patient will benefit from skilled therapeutic intervention in order to improve the following deficits and impairments:  Decreased coordination, Decreased range of motion, Impaired sensation, Decreased safety awareness, Decreased endurance, Decreased activity tolerance, Pain, Impaired UE functional use, Decreased knowledge of use of DME, Decreased balance, Decreased cognition, Decreased strength, Impaired vision/preception  Visit Diagnosis: Hemiplegia and hemiparesis following cerebral infarction affecting right dominant side ([place]) - Plan: Ot plan of care cert/re-cert  Chronic right  shoulder pain - Plan: Ot plan of care cert/re-cert  Other lack of coordination - Plan: Ot plan of care cert/re-cert  Muscle weakness (generalized) - Plan: Ot plan of care cert/re-cert    Problem List Patient Active Problem List   Diagnosis Date Noted  . Tooth pain   . CVA (cerebral infarction) 12/27/2015  . Hypothyroidism due to acquired atrophy of thyroid   . HLD (hyperlipidemia)   . Hemiparesis affecting dominant side as late effect of stroke ([place])   . 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 Tobacco abuse   . Cocaine abuse   . ETOH abuse    . Hypothyroidism (acquired) 12/24/2015  . Acute ischemic stroke ([place]) 12/24/2015  . Stroke ([place]) 12/24/2015  . Drug abuse, cocaine type 05/09/2014    Quay Burow, OTR/L 01/06/2017, 5:45 PM  [facility] [address] [place], [place], [address] Phone: [phone]   Fax:  [phone]  Name: NATHALI TOTINO MRN: [id number] Date of Birth: 1966/05/28

## 2017-01-13 ENCOUNTER — Encounter (HOSPITAL_COMMUNITY): Payer: Self-pay

## 2017-01-13 ENCOUNTER — Emergency Department (HOSPITAL_COMMUNITY)
Admission: EM | Admit: 2017-01-13 | Discharge: 2017-01-13 | Disposition: A | Discharge status: Home / Self Care | Payer: Medicaid Other | Attending: Emergency Medicine | Admitting: Emergency Medicine

## 2017-01-13 DIAGNOSIS — E039 Hypothyroidism, unspecified: Secondary | ICD-10-CM | POA: Insufficient documentation

## 2017-01-13 DIAGNOSIS — F17210 Nicotine dependence, cigarettes, uncomplicated: Secondary | ICD-10-CM | POA: Insufficient documentation

## 2017-01-13 DIAGNOSIS — Z7982 Long term (current) use of aspirin: Secondary | ICD-10-CM | POA: Diagnosis not present

## 2017-01-13 DIAGNOSIS — Z760 Encounter for issue of repeat prescription: Secondary | ICD-10-CM | POA: Insufficient documentation

## 2017-01-13 DIAGNOSIS — Z85850 Personal history of malignant neoplasm of thyroid: Secondary | ICD-10-CM | POA: Diagnosis not present

## 2017-01-13 DIAGNOSIS — Z8673 Personal history of transient ischemic attack (TIA), and cerebral infarction without residual deficits: Secondary | ICD-10-CM | POA: Insufficient documentation

## 2017-01-13 DIAGNOSIS — R5383 Other fatigue: Secondary | ICD-10-CM | POA: Diagnosis not present

## 2017-01-13 LAB — I-STAT CHEM 8, ED
BUN: 17 mg/dL (ref 6–20)
CALCIUM ION: 1.15 mmol/L (ref 1.15–1.40)
CREATININE: 1 mg/dL (ref 0.44–1.00)
Chloride: 104 mmol/L (ref 101–111)
GLUCOSE: 108 mg/dL — AB (ref 65–99)
HCT: 47 % — ABNORMAL HIGH (ref 36.0–46.0)
HEMOGLOBIN: 16 g/dL — AB (ref 12.0–15.0)
Potassium: 3.9 mmol/L (ref 3.5–5.1)
Sodium: 144 mmol/L (ref 135–145)
TCO2: 29 mmol/L (ref 0–100)

## 2017-01-13 MED ORDER — LEVOTHYROXINE SODIUM 125 MCG PO TABS: 125.0000 ug | ORAL_TABLET | Freq: Every day | ORAL | 0 refills | Status: DC

## 2017-01-13 NOTE — ED Triage Notes (Signed)
Patient here for synthroid prescription. States she has been out of her medication x 8-10 days. States that she feels fatigued due to same. No pain, alert and oriented

## 2017-01-13 NOTE — Discharge Instructions (Signed)
Please make an appointment to establish care with a primary care provider and obtain refills for chronic medications.

## 2017-01-13 NOTE — ED Provider Notes (Signed)
[facility] DEPT Provider Note   CSN: [id number] Arrival date & time: 01/13/17  1229   By signing my name below, I, Evelene Croon, attest that this documentation has been prepared under the direction and in the presence of Avie Echevaria, [place]. Electronically Signed: Evelene Croon, Scribe. 01/13/2017. 1:33 PM.  History   Chief Complaint Chief Complaint  Patient presents with  . med refill/ thyroid medication    The history is provided by the patient and medical records. No language interpreter was used.     HPI Comments:  Theresa Watkins is a 51 y.o. female who presents to the Emergency Department for a medication refill. Pt states she ran out of her Synthroid ~9 days ago. She states feels sluggish and  her speech is also slightly more sluggish than usual which she states is what typically happens when she does not take her synthroid. She also notes increased exercise while off her meds which she states has also contributed to her feeling today. 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 Pt has no other acute complaints or symptoms at this time. Per chart review pt was seen in the ED in November 2017 for similar complaint and discharged with Rx for her synthroid.   No PCP; states she cannot get into be seen until May 2018.   Past Medical History:  Diagnosis Date  . Abnormal Pap smear   . Bronchitis   . Fibroid   . Headache(784.0)   . Ovarian cyst   . Seizures ([place])   . Stroke ([place])   . Thyroid cancer ([place]) 20011  . Trichomonas   . Urinary tract infection     Patient Active Problem List   Diagnosis Date Noted  . Tooth pain   . CVA (cerebral infarction) 12/27/2015  . Hypothyroidism due to acquired atrophy of thyroid   . HLD (hyperlipidemia)   . Hemiparesis affecting dominant side as late effect of stroke ([place])   . 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 Tobacco abuse   . Cocaine abuse   . ETOH abuse   . Hypothyroidism (acquired)  12/24/2015  . Acute ischemic stroke ([place]) 12/24/2015  . Stroke ([place]) 12/24/2015  . Drug abuse, cocaine type 05/09/2014    Past Surgical History:  Procedure Laterality Date  . ABDOMINAL HYSTERECTOMY    . CESAREAN SECTION    . goiter     removed  . laparoscopic  1988   removal  of ectopic preg, ruptured tube  . THERAPEUTIC ABORTION    . THYROIDECTOMY  march 2011   cancer    OB History    Gravida Para Term Preterm AB Living   3 1 1  0 2 1   SAB TAB Ectopic Multiple Live Births   0 1 1 0         Home Medications    Prior to Admission medications   Medication Sig Start Date End Date Taking? Authorizing Provider  aspirin EC 325 MG EC tablet Take 1 tablet (325 mg total) by mouth daily. 12/27/15   Modena Jansky, MD  Calcium Carbonate-Vitamin D (CALCIUM + D PO) Take 2 tablets by mouth 2 (two) times daily.     Historical Provider, MD  cetirizine (ZYRTEC) 10 MG tablet Take 1 tablet (10 mg total) by mouth daily. 08/30/16   Melynda Ripple, MD  levothyroxine (SYNTHROID, LEVOTHROID) 125 MCG tablet Take 1 tablet (125 mcg total) by mouth daily before breakfast. 01/13/17 02/10/17  Janett Billow  B Corbin Hott, PA-C  omeprazole (PRILOSEC) 20 MG capsule Take 1 capsule (20 mg total) by mouth daily. 11/28/16   Jola Schmidt, MD  simvastatin (ZOCOR) 20 MG tablet Take 1 tablet (20 mg total) by mouth daily at 6 PM. 07/25/16   Charlett Blake, MD    Family History Family History  Problem Relation Age of Onset  . Bell's palsy Mother   . Cancer Father   . Stroke Father     Social History Social History  Substance Use Topics  . Smoking status: Light Tobacco Smoker    Packs/day: 0.10    Years: 25.00    Types: Cigarettes  . Smokeless tobacco: Never Used     Comment: smoke 2 a day trying to quit  . Alcohol use 0.0 oz/week     Comment: occ     Allergies   Iohexol; Proanthocyanidin; Diphenhydramine hcl; Percocet [oxycodone-acetaminophen]; and Tramadol   Review of Systems Review of Systems    Constitutional: Positive for fatigue. Negative for chills and fever.       +Medication refill  Respiratory: Negative for chest tightness and shortness of breath.   Cardiovascular: Negative for chest pain, palpitations and leg swelling.  Gastrointestinal: Negative for abdominal pain, nausea and vomiting.  Skin: Negative for color change and pallor.     Physical Exam Updated Vital Signs BP 116/90 (BP Location: Right Arm)   Pulse 67   Temp 98.3 F (36.8 C) (Oral)   Resp 17   SpO2 99%   Physical Exam  Constitutional: She is oriented to person, place, and time. She appears well-developed and well-nourished. No distress.  Patient is afebrile, non-toxic appearing, seating comfortably in chair in no acute distress.  HENT:  Head: Normocephalic and atraumatic.  Eyes: Conjunctivae are normal.  Cardiovascular: Normal rate, regular rhythm and normal heart sounds.   Pulmonary/Chest: Effort normal and breath sounds normal. No respiratory distress.  Abdominal: She exhibits no distension.  Neurological: She is alert and oriented to person, place, and time.  4/5 strength on the right side which is the pt's baseline  Skin: Skin is warm and dry.  Psychiatric: She has a normal mood and affect.  Nursing note and vitals reviewed.    ED Treatments / Results  DIAGNOSTIC STUDIES:  Oxygen Saturation is 99% on RA, normal by my interpretation.    COORDINATION OF CARE:  1:28 PM Discussed treatment plan with pt at bedside and pt agreed to plan.  Labs (all labs ordered are listed, but only abnormal results are displayed) Labs Reviewed - No data to display  EKG  EKG Interpretation None       Radiology No results found.  Procedures Procedures (including critical care time)  Medications Ordered in ED Medications - No data to display   Initial Impression / Assessment and Plan / ED Course  I have reviewed the triage vital signs and the nursing notes.  Pertinent labs & imaging results  that were available during my care of the patient were reviewed by me and considered in my medical decision making (see chart for details).     Pt here for refill of medication. Medication is not a controlled substance. Will refill medication here. Discussed need to follow up with PCP as soon as possible.  Pt is safe for discharge at this time.  Patient has 4/5 strength on the right which is baseline for patient s/p CVA. Exam otherwise unremarkable. Patient reports feeling as she always does when she is off her medication. She  reports being fatigued and slow speech but no c/p, sob, palpitations or other symptoms. istat chem 8 unremarkable EKG unremarkable from prior tracing in january  Urged patient to establish care with PCP. Patient has multiple visits to ED for same medication refill complaint. Provided her with resources.  Discussed strict return precautions. Patient was advised to return to the emergency department if experiencing any new or worsening symptoms. Patient clearly understood instructions and agreed with discharge plan.  Patient was discussed with Dr. Ashok Cordia who agrees with assessment and plan.  Final Clinical Impressions(s) / ED Diagnoses   Final diagnoses:  Encounter for medication refill    New Prescriptions New Prescriptions   LEVOTHYROXINE (SYNTHROID, LEVOTHROID) 125 MCG TABLET    Take 1 tablet (125 mcg total) by mouth daily before breakfast.   I personally performed the services described in this documentation, which was scribed in my presence. The recorded information has been reviewed and is accurate.     Emeline General, PA-C 01/13/17 1352    Emeline General, PA-C 01/13/17 [place], MD 01/13/17 [phone]

## 2017-01-13 NOTE — ED Notes (Signed)
Pt verbalizes understanding of DC teaching and medication. Pt given resources for Health and Wellness as well as [facility] resources. NAD. VSS.

## 2017-01-20 ENCOUNTER — Ambulatory Visit: Payer: Medicaid Other | Admitting: Occupational Therapy

## 2017-01-21 ENCOUNTER — Encounter: Payer: Medicaid Other | Admitting: Occupational Therapy

## 2017-01-23 ENCOUNTER — Ambulatory Visit: Payer: Medicaid Other | Attending: Nurse Practitioner | Admitting: Occupational Therapy

## 2017-01-23 DIAGNOSIS — M25511 Pain in right shoulder: Secondary | ICD-10-CM | POA: Diagnosis present

## 2017-01-23 DIAGNOSIS — I69359 Hemiplegia and hemiparesis following cerebral infarction affecting unspecified side: Secondary | ICD-10-CM | POA: Insufficient documentation

## 2017-01-23 DIAGNOSIS — I69351 Hemiplegia and hemiparesis following cerebral infarction affecting right dominant side: Secondary | ICD-10-CM | POA: Diagnosis present

## 2017-01-23 DIAGNOSIS — M6281 Muscle weakness (generalized): Secondary | ICD-10-CM | POA: Insufficient documentation

## 2017-01-23 DIAGNOSIS — R278 Other lack of coordination: Secondary | ICD-10-CM | POA: Diagnosis not present

## 2017-01-23 DIAGNOSIS — G8929 Other chronic pain: Secondary | ICD-10-CM | POA: Diagnosis present

## 2017-01-23 DIAGNOSIS — R2681 Unsteadiness on feet: Secondary | ICD-10-CM | POA: Diagnosis present

## 2017-01-23 NOTE — Therapy (Signed)
[facility] [address] [place], [place], [address] Phone: [phone]   Fax:  [phone]  Occupational Therapy Treatment  Patient Details  Name: CLEO SANTUCCI MRN: [id number] Date of Birth: 1966/03/21 Referring Provider: Dr. Evlyn Courier  Encounter Date: 01/23/2017      OT End of Session - 01/23/17 1613    Visit Number 2   Number of Visits 4   Date for OT Re-Evaluation 02/07/17   Authorization Type Medicaid/MCD - requesting Eval + 3 additional visits for OT over next 6 weeks   OT Start Time 1740   OT Stop Time 1531   OT Time Calculation (min) 44 min   Activity Tolerance Patient tolerated treatment well      Past Medical History:  Diagnosis Date  . Abnormal Pap smear   . Bronchitis   . Fibroid   . Headache(784.0)   . Ovarian cyst   . Seizures ([place])   . Stroke ([place])   . Thyroid cancer ([place]) 20011  . Trichomonas   . Urinary tract infection     Past Surgical History:  Procedure Laterality Date  . ABDOMINAL HYSTERECTOMY    . CESAREAN SECTION    . goiter     removed  . laparoscopic  1988   removal  of ectopic preg, ruptured tube  . THERAPEUTIC ABORTION    . THYROIDECTOMY  march 2011   cancer    There were no vitals filed for this visit.      Subjective Assessment - 01/23/17 1454    Subjective  thank you so much this makes it much easier to write (pt issued pencil grip)   Pertinent History see epic   Patient Stated Goals I need all three therapies   Currently in Pain? Yes   Pain Score 7    Pain Location Leg   Pain Orientation Left   Pain Descriptors / Indicators Cramping   Pain Type Acute pain   Pain Onset In the past 7 days   Pain Frequency Intermittent   Aggravating Factors  exercise   Pain Relieving Factors sitting down or resting                      OT Treatments/Exercises (OP) - 01/23/17 0001      ADLs   Writing Practiced writng with various AE - pt with  improved legibility using pencil gripper.  Pt able to write name legibly as well as all letters in alphabet. Pt to practice at home.      Exercises   Exercises Hand     Hand Exercises   Hand Gripper with Small Beads Gripper on #1 picking up 1 inch blocks - pt with minimal dropping.  Pt needs modcues to remain on task and is easily distracted.       Fine Motor Coordination   Other Fine Motor Exercises Issued pt HEP for coordination - pt able to complete all activities and return demonstrate without cues by end of session. Provided in writing.  See pt instruction section for details                OT Education - 01/23/17 1612    Education provided Yes   Education Details HEP for coordination, pencil grip for writing   Person(s) Educated Patient   Methods Explanation;Demonstration;Handout;Verbal cues   Comprehension Verbalized understanding;Returned demonstration          OT Short Term Goals - 01/23/17 1612  OT SHORT TERM GOAL #1   Title STG's = LTG's  STG's = LTG's - defer all old STG's      OT SHORT TERM GOAL #2   Title STG's = LTG's     OT SHORT TERM GOAL #3   Title STG's = LTG's     OT SHORT TERM GOAL #4   Title STG's = LTG's           OT Long Term Goals - 01/23/17 1612      OT LONG TERM GOAL #1   Title Pt will be mod I with HEP for RUE strength, coordination and grip strength   Baseline dependent   Status On-going     OT LONG TERM GOAL #2   Title Pt will demonstrate improved grip strength by 5 pounds to assist with functional tasks.   Baseline 30 pounds   Status On-going     OT LONG TERM GOAL #3   Title Pt will be able to write name with 100% legibility using AE prn   Baseline 50% legibilty for name only   Status Achieved               Plan - 01/23/17 1613    Clinical Impression Statement Pt making progress toward goals.  Pt able to return demonstrate all activities for HEP and verbalize understanding.    Rehab Potential Fair    Clinical Impairments Affecting Rehab Potential H/O polysubstance abuse; decreased ability for carry over of new learning   OT Frequency 1x / week   OT Duration 4 weeks   OT Treatment/Interventions Self-care/ADL training;DME and/or AE instruction;Patient/family education;Therapeutic exercises;Therapeutic exercise;Therapeutic activities;Neuromuscular education;Functional Mobility Training;Visual/perceptual remediation/compensation   Plan check HEP - address UE strengthening HEP   Consulted and Agree with Plan of Care Patient      Patient will benefit from skilled therapeutic intervention in order to improve the following deficits and impairments:  Decreased coordination, Decreased range of motion, Impaired sensation, Decreased safety awareness, Decreased endurance, Decreased activity tolerance, Pain, Impaired UE functional use, Decreased knowledge of use of DME, Decreased balance, Decreased cognition, Decreased strength, Impaired vision/preception  Visit Diagnosis: Other lack of coordination  Muscle weakness (generalized)  Hemiparesis affecting dominant side as late effect of stroke (HCC)  Chronic right shoulder pain    Problem List Patient Active Problem List   Diagnosis Date Noted  . Tooth pain   . CVA (cerebral infarction) 12/27/2015  . Hypothyroidism due to acquired atrophy of thyroid   . HLD (hyperlipidemia)   . Hemiparesis affecting dominant side as late effect of stroke ([place])   . 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 Tobacco abuse   . Cocaine abuse   . ETOH abuse   . Hypothyroidism (acquired) 12/24/2015  . Acute ischemic stroke ([place]) 12/24/2015  . Stroke ([place]) 12/24/2015  . Drug abuse, cocaine type 05/09/2014    Quay Burow, OTR/L 01/23/2017, 4:16 PM  [facility] [address] [place], [place], [address] Phone: [phone]   Fax:  [phone]  Name: NORELL BRISBIN MRN:  [id number] Date of Birth: Mar 31, 1966

## 2017-01-23 NOTE — Patient Instructions (Signed)
  Coordination Activities  Perform the following activities for 15-20 minutes  2 times per day with right hand(s).   Rotate ball in fingertips (clockwise and counter-clockwise).  Toss ball between hands.  Toss ball in air and catch with the same hand.  Flip cards 1 at a time as fast as you can.  Pick up coins and place in container or coin bank.  Pick up coins and stack.  Pick up coins one at a time until you get 5-10 in your hand. While holding them in your hand, put them into the coin bank one at a time.  Practice writing and/or typing.  Screw together nuts and bolts, then unfasten. Always use your RIGHT hand to turn the bolt/screw and hold it with your left hand.   As you get better at these activities you can make them harder by: 1. Doing them faster without dropping things 2. Using dimes instead of pennies 3. Doing the activities for 20-25 minutes (longer)

## 2017-01-30 ENCOUNTER — Ambulatory Visit: Payer: Medicaid Other | Admitting: Occupational Therapy

## 2017-01-30 ENCOUNTER — Encounter: Payer: Self-pay | Admitting: Occupational Therapy

## 2017-01-30 DIAGNOSIS — R278 Other lack of coordination: Secondary | ICD-10-CM

## 2017-01-30 DIAGNOSIS — M25511 Pain in right shoulder: Secondary | ICD-10-CM

## 2017-01-30 DIAGNOSIS — I69351 Hemiplegia and hemiparesis following cerebral infarction affecting right dominant side: Secondary | ICD-10-CM

## 2017-01-30 DIAGNOSIS — R2681 Unsteadiness on feet: Secondary | ICD-10-CM

## 2017-01-30 DIAGNOSIS — M6281 Muscle weakness (generalized): Secondary | ICD-10-CM

## 2017-01-30 DIAGNOSIS — G8929 Other chronic pain: Secondary | ICD-10-CM

## 2017-01-30 DIAGNOSIS — I69359 Hemiplegia and hemiparesis following cerebral infarction affecting unspecified side: Secondary | ICD-10-CM

## 2017-01-30 NOTE — Therapy (Signed)
[facility] [address] [place], [place], [address] Phone: [phone]   Fax:  [phone]  Occupational Therapy Treatment  Patient Details  Name: Theresa Watkins MRN: [id number] Date of Birth: Jun 29, 1966 Referring Provider: Dr. Evlyn Courier  Encounter Date: 01/30/2017      OT End of Session - 01/30/17 1654    Visit Number 3   Number of Visits 4   Date for OT Re-Evaluation 02/07/17   Authorization Type Medicaid/MCD - requesting Eval + 3 additional visits for OT over next 6 weeks   OT Start Time 2426   OT Stop Time 1615   OT Time Calculation (min) 45 min   Activity Tolerance Patient limited by pain   Behavior During Therapy Impulsive      Past Medical History:  Diagnosis Date  . Abnormal Pap smear   . Bronchitis   . Fibroid   . Headache(784.0)   . Ovarian cyst   . Seizures ([place])   . Stroke ([place])   . Thyroid cancer ([place]) 20011  . Trichomonas   . Urinary tract infection     Past Surgical History:  Procedure Laterality Date  . ABDOMINAL HYSTERECTOMY    . CESAREAN SECTION    . goiter     removed  . laparoscopic  1988   removal  of ectopic preg, ruptured tube  . THERAPEUTIC ABORTION    . THYROIDECTOMY  march 2011   cancer    There were no vitals filed for this visit.      Subjective Assessment - 01/30/17 1542    Subjective  I think I need an MRI- but they need to put me to sleep   Pertinent History see epic   Currently in Pain? Yes   Pain Score 6    Pain Location Head   Pain Orientation Anterior   Pain Descriptors / Indicators Aching   Pain Type Acute pain   Pain Onset 1 to 4 weeks ago   Pain Frequency Intermittent   Aggravating Factors  light   Pain Relieving Factors rest                      OT Treatments/Exercises (OP) - 01/30/17 0001      Fine Motor Coordination   Other Fine Motor Exercises Patient was concerned that her hand was "stiffening up" during exercise.   Had patient review all of her recnet coordination exercises, and she is moving very well during these exercises.  Patient describing numbness and tingling - but no evidence of increased stiffness with fine motor coordination.       Neurological Re-education Exercises   Other Exercises 1 Worked to establish exercises for proximal strengthening in right shoulder.  Patient with frequent report of arthritis pain in right shoulder with supine modified closed chain exercise.  Patient with increased discomfort from ~60-110 degrees of shoudler flexion - but then decreased discomfort at end range. (question rotator cuff)  Patient instructed to not due exercises which increase her pain.  In sitting worked on supported low to mid level reach with some success.  Patient would need further practice to have this issued as home exercise program, asn she had difficulty following instructions to move in slow, controlled fashion for reach patterns.     Hand Gripper with Large Beads Gripper on second level x 18 blocks.  Patient reported fatigue - right hand.  OT Education - 01/30/17 1654    Education provided Yes   Education Details Reviewed coordination exercises   Person(s) Educated Patient   Methods Explanation;Demonstration;Verbal cues;Tactile cues   Comprehension Verbalized understanding;Returned demonstration;Need further instruction          OT Short Term Goals - 01/23/17 1612      OT SHORT TERM GOAL #1   Title STG's = LTG's  STG's = LTG's - defer all old STG's      OT SHORT TERM GOAL #2   Title STG's = LTG's     OT SHORT TERM GOAL #3   Title STG's = LTG's     OT SHORT TERM GOAL #4   Title STG's = LTG's           OT Long Term Goals - 01/23/17 1612      OT LONG TERM GOAL #1   Title Pt will be mod I with HEP for RUE strength, coordination and grip strength   Baseline dependent   Status On-going     OT LONG TERM GOAL #2   Title Pt will demonstrate improved grip  strength by 5 pounds to assist with functional tasks.   Baseline 30 pounds   Status On-going     OT LONG TERM GOAL #3   Title Pt will be able to write name with 100% legibility using AE prn   Baseline 50% legibilty for name only   Status Achieved               Plan - 01/30/17 1655    Clinical Impression Statement Patient demonstrates improved hand strength today.     Rehab Potential Fair   Clinical Impairments Affecting Rehab Potential H/O polysubstance abuse; decreased ability for carry over of new learning   OT Frequency 1x / week   OT Duration 4 weeks   OT Treatment/Interventions Self-care/ADL training;DME and/or AE instruction;Patient/family education;Therapeutic exercises;Therapeutic exercise;Therapeutic activities;Neuromuscular education;Functional Mobility Training;Visual/perceptual remediation/compensation   Plan establish RUE strengthening program, check LTG, discharge   Consulted and Agree with Plan of Care Patient      Patient will benefit from skilled therapeutic intervention in order to improve the following deficits and impairments:  Decreased coordination, Decreased range of motion, Impaired sensation, Decreased safety awareness, Decreased endurance, Decreased activity tolerance, Pain, Impaired UE functional use, Decreased knowledge of use of DME, Decreased balance, Decreased cognition, Decreased strength, Impaired vision/preception  Visit Diagnosis: Other lack of coordination  Muscle weakness (generalized)  Hemiparesis affecting dominant side as late effect of stroke (HCC)  Chronic right shoulder pain  Hemiplegia and hemiparesis following cerebral infarction affecting right dominant side (HCC)  Right shoulder pain, unspecified chronicity  Unsteadiness on feet    Problem List Patient Active Problem List   Diagnosis Date Noted  . Tooth pain   . CVA (cerebral infarction) 12/27/2015  . Hypothyroidism due to acquired atrophy of thyroid   . HLD  (hyperlipidemia)   . Hemiparesis affecting dominant side as late effect of stroke ([place])   . 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 Tobacco abuse   . Cocaine abuse   . ETOH abuse   . Hypothyroidism (acquired) 12/24/2015  . Acute ischemic stroke ([place]) 12/24/2015  . Stroke ([place]) 12/24/2015  . Drug abuse, cocaine type 05/09/2014    Mariah Milling, OTR/L 01/30/2017, 4:58 PM  [facility] [address] [place], [place], [address] Phone: [phone]   Fax:  [phone]  Name: Theresa Watkins  MRN: [id number] Date of Birth: 07-28-1966

## 2017-02-04 ENCOUNTER — Ambulatory Visit: Payer: Medicaid Other | Admitting: Occupational Therapy

## 2017-02-04 ENCOUNTER — Encounter: Payer: Self-pay | Admitting: Occupational Therapy

## 2017-02-04 ENCOUNTER — Telehealth: Payer: Self-pay | Admitting: Neurology

## 2017-02-04 DIAGNOSIS — G8929 Other chronic pain: Secondary | ICD-10-CM

## 2017-02-04 DIAGNOSIS — I69359 Hemiplegia and hemiparesis following cerebral infarction affecting unspecified side: Secondary | ICD-10-CM

## 2017-02-04 DIAGNOSIS — R278 Other lack of coordination: Secondary | ICD-10-CM

## 2017-02-04 DIAGNOSIS — M6281 Muscle weakness (generalized): Secondary | ICD-10-CM

## 2017-02-04 DIAGNOSIS — M25511 Pain in right shoulder: Secondary | ICD-10-CM

## 2017-02-04 NOTE — Telephone Encounter (Signed)
Patient would like a call back regarding setting up an MRI. Best number to reach her is [phone]

## 2017-02-04 NOTE — Therapy (Signed)
[facility] [address] [place], [place], [address] Phone: [phone]   Fax:  [phone]  Occupational Therapy Treatment  Patient Details  Name: AKAIYA TOUCHETTE MRN: [id number] Date of Birth: 05-14-1966 Referring Provider: Dr. Evlyn Courier  Encounter Date: 02/04/2017      OT End of Session - 02/04/17 1623    Visit Number 4   Number of Visits 4   Date for OT Re-Evaluation 02/07/17   Authorization Type Medicaid/MCD - requesting Eval + 3 additional visits for OT over next 6 weeks   OT Start Time 7824   OT Stop Time 1616   OT Time Calculation (min) 44 min   Activity Tolerance Patient tolerated treatment well      Past Medical History:  Diagnosis Date  . Abnormal Pap smear   . Bronchitis   . Fibroid   . Headache(784.0)   . Ovarian cyst   . Seizures ([facility])   . Stroke ([facility])   . Thyroid cancer ([facility]) 20011  . Trichomonas   . Urinary tract infection     Past Surgical History:  Procedure Laterality Date  . ABDOMINAL HYSTERECTOMY    . CESAREAN SECTION    . goiter     removed  . laparoscopic  1988   removal  of ectopic preg, ruptured tube  . THERAPEUTIC ABORTION    . THYROIDECTOMY  march 2011   cancer    There were no vitals filed for this visit.      Subjective Assessment - 02/04/17 1536    Pertinent History see epic   Patient Stated Goals I need all three therapies   Currently in Pain? Yes   Pain Score 6    Pain Location Arm   Pain Orientation Right   Pain Descriptors / Indicators Aching   Pain Type Chronic pain   Pain Onset More than a month ago   Pain Frequency Intermittent   Aggravating Factors  not sure   Pain Relieving Factors bayer aspirin   Multiple Pain Sites Yes   Pain Score 6   Pain Location Leg   Pain Orientation Right   Pain Descriptors / Indicators Aching;Sore   Pain Type Chronic pain   Pain Onset More than a month ago   Pain Frequency Constant   Aggravating Factors   weather, rain   Pain Relieving Factors nothing, sleep                      OT Treatments/Exercises (OP) - 02/04/17 0001      Exercises   Exercises Shoulder     Shoulder Exercises: Seated   Other Seated Exercises Pt issued HEP program to address gentle ROM and strengthening for proximal RUE.  Pt able to return demonstrate all activities and tolerated well. Pt limited by pain therefore avoided any resistive or overhead activities.  Pt able to verbalize understanding and tolerated all activities well.  See pt instruction for details.                 OT Education - 02/04/17 1621    Education provided Yes   Education Details HEP for RUE proximal ROM and gentle strengthening   Person(s) Educated Patient   Methods Explanation;Demonstration;Verbal cues;Handout   Comprehension Verbalized understanding;Returned demonstration          OT Short Term Goals - 01/23/17 1612      OT SHORT TERM GOAL #1   Title STG's = LTG's  STG's = LTG's -  defer all old STG's      OT SHORT TERM GOAL #2   Title STG's = LTG's     OT SHORT TERM GOAL #3   Title STG's = LTG's     OT SHORT TERM GOAL #4   Title STG's = LTG's           OT Long Term Goals - 02/04/17 1622      OT LONG TERM GOAL #1   Title Pt will be mod I with HEP for RUE strength, coordination and grip strength   Baseline dependent   Status Achieved     OT LONG TERM GOAL #2   Title Pt will demonstrate improved grip strength by 5 pounds to assist with functional tasks.   Baseline 30 pounds   Status Achieved  42 pounds     OT LONG TERM GOAL #3   Title Pt will be able to write name with 100% legibility using AE prn   Baseline 50% legibilty for name only   Status Achieved               Plan - 02/04/17 1622    Clinical Impression Statement Pt has met all LTG's and has used all approved visits.  Pt is ready for discharge.   Rehab Potential Fair   Clinical Impairments Affecting Rehab Potential H/O  polysubstance abuse; decreased ability for carry over of new learning   OT Frequency 1x / week   OT Duration 4 weeks   OT Treatment/Interventions Self-care/ADL training;DME and/or AE instruction;Patient/family education;Therapeutic exercises;Therapeutic exercise;Therapeutic activities;Neuromuscular education;Functional Mobility Training;Visual/perceptual remediation/compensation   Plan d/c from OT   Consulted and Agree with Plan of Care Patient      Patient will benefit from skilled therapeutic intervention in order to improve the following deficits and impairments:  Decreased coordination, Decreased range of motion, Impaired sensation, Decreased safety awareness, Decreased endurance, Decreased activity tolerance, Pain, Impaired UE functional use, Decreased knowledge of use of DME, Decreased balance, Decreased cognition, Decreased strength, Impaired vision/preception  Visit Diagnosis: Other lack of coordination  Muscle weakness (generalized)  Hemiparesis affecting dominant side as late effect of stroke (HCC)  Chronic right shoulder pain    Problem List Patient Active Problem List   Diagnosis Date Noted  . Tooth pain   . CVA (cerebral infarction) 12/27/2015  . Hypothyroidism due to acquired atrophy of thyroid   . HLD (hyperlipidemia)   . Hemiparesis affecting dominant side as late effect of stroke (HCC)   . 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 Tobacco abuse   . Cocaine abuse   . ETOH abuse   . Hypothyroidism (acquired) 12/24/2015  . Acute ischemic stroke (HCC) 12/24/2015  . Stroke (HCC) 12/24/2015  . Drug abuse, cocaine type 05/09/2014   OCCUPATIONAL THERAPY DISCHARGE SUMMARY  Visits from Start of Care: 4  Current functional level related to goals / functional outcomes: See above   Remaining deficit: RUE weakness, chronic R shoulder pain   Education / Equipment: HEP  Plan: Patient agrees to discharge.  Patient goals were met. Patient is being  discharged due to meeting the stated rehab goals.  ?????      [place], [name] Halliday, OTR/L 02/04/2017, 4:24 PM  [facility] [address] [place], [place], [address] Phone: [phone]   Fax:  [phone]  Name: Ashya K Garmon MRN: [id number] Date of Birth: 08/19/1966 

## 2017-02-04 NOTE — Patient Instructions (Signed)
Home Exercise Program For Your Right Arm:  Do these slowly - fast movements may cause additional pain. These should be nice easy movements.  Do these every day 1-2 times per day if possible.  We know you will have days that you are busy and may only be able to do them once or even not at all but shoot for an average of 1-2 times per day.  Sit on a firm surface that isn't too low to the ground.    Here in the clinic we used a yard stick.  It was more comfortable for you with the blue foam held in place using the brown wrap.  This will make it easier and more comfortable to hold the yard stick.  You may want to put the yard stick up on something (such as books built up) to find a comfortable spot that is the least painful for your shoulder.  It shouldn't be too low or too high as either of these may be uncomfortable. Find what is comfortable for you.   1. Hold the yard stick at the top of the stick with your right hand in front of you. Start with your elbow bent. Slowly push yard stick out until your elbow is straight and then hold it in place for a slow count of 5.  Return to the starting position.  Do 15, rest at least 30- 60 seconds and then do 15 more.  If you need to rest more that is fine but try and do all the repetitions if possible.    2.  Hold yard stick at the top with your right hand.  Place yard stick to you right side. Your palm should be facing forward with your elbow bent.  Slowly push stick out to the side until your elbow is straight.  Hold for a slow count of five then return to the starting position.  Do 15, rest at least 30-60 seconds and then do 15 more.    Do these exercises to what ever you can tolerate.  Our goal is to keep the shoulder moving but not make the pain worse.

## 2017-02-05 ENCOUNTER — Telehealth: Payer: Self-pay

## 2017-02-05 NOTE — Telephone Encounter (Signed)
Patient is under Dr. Leonie Man and Carolyn(NP) care. Please read notes and schedule with Dr. Leonie Man or Hoyle Sauer NP when requested.

## 2017-02-05 NOTE — Telephone Encounter (Signed)
RN call patient back. Pt sees Dr. Leonie Man and Carolyn(NP) for stroke. Pt requesting a MRI for her back and leg pain. Rn stated she would need a earlier appt. Rn offer pt appt this week but she decline. Pt schedule for February 28, 2017. Rn remind pt of the cancellation fee for 50.00 dollars.

## 2017-02-05 NOTE — Telephone Encounter (Signed)
PT was told to call ahead 24 hours prior to appt to avoid 50.00 cancellation fee. Pt stated she will arrange transportation.

## 2017-02-06 ENCOUNTER — Telehealth: Payer: Self-pay | Admitting: General Practice

## 2017-02-06 NOTE — Telephone Encounter (Signed)
Patient called the office to request medication refill for levothyroxine (SYNTHROID, LEVOTHROID) 125 MCG tablet. Please call it in to our pharmacy.   Thank you.

## 2017-02-06 NOTE — Telephone Encounter (Signed)
Patient is not established here and has not been seen in over a year - must have office visit for any refills.

## 2017-02-12 ENCOUNTER — Telehealth: Payer: Self-pay | Admitting: Family Medicine

## 2017-02-12 ENCOUNTER — Other Ambulatory Visit: Payer: Self-pay | Admitting: Family Medicine

## 2017-02-12 NOTE — Telephone Encounter (Signed)
Attempted to call pt to verify med requested, no answer nor voicemail option. Will attempt again on 02/13/17.

## 2017-02-12 NOTE — Telephone Encounter (Signed)
Pt calling to see if she can get a refill of at least 3 pills to last until her appt on 02/17/17. Please call pt to inform her if she can get the refill or not. Thank you.

## 2017-02-12 NOTE — Telephone Encounter (Signed)
Pt calling to see if she can get a refill of at least 3 pills to last until her appt on 02/17/17. Please advice?

## 2017-02-12 NOTE — Telephone Encounter (Signed)
Which medication is she referring to? Her current medication list does not include any medications for blood pressure. Please follow up .

## 2017-02-13 NOTE — Telephone Encounter (Signed)
CMA call to get more info on which med she wants to be refill on

## 2017-02-13 NOTE — Telephone Encounter (Signed)
She wants her thyroid medication but I told her that she would have to wait until her appointment & get screen for thyroid & base on her results you will prescribe her some.

## 2017-02-17 ENCOUNTER — Encounter: Payer: Self-pay | Admitting: Family Medicine

## 2017-02-17 ENCOUNTER — Ambulatory Visit: Payer: Medicaid Other | Attending: Family Medicine | Admitting: Family Medicine

## 2017-02-17 VITALS — BP 110/76 | HR 76 | Temp 98.1°F | Resp 18 | Ht 65.0 in | Wt 161.2 lb

## 2017-02-17 DIAGNOSIS — E039 Hypothyroidism, unspecified: Secondary | ICD-10-CM | POA: Diagnosis not present

## 2017-02-17 DIAGNOSIS — H547 Unspecified visual loss: Secondary | ICD-10-CM | POA: Diagnosis not present

## 2017-02-17 DIAGNOSIS — Z7982 Long term (current) use of aspirin: Secondary | ICD-10-CM | POA: Diagnosis not present

## 2017-02-17 DIAGNOSIS — I10 Essential (primary) hypertension: Secondary | ICD-10-CM | POA: Insufficient documentation

## 2017-02-17 DIAGNOSIS — H0015 Chalazion left lower eyelid: Secondary | ICD-10-CM | POA: Insufficient documentation

## 2017-02-17 DIAGNOSIS — M7918 Myalgia, other site: Secondary | ICD-10-CM

## 2017-02-17 DIAGNOSIS — M79601 Pain in right arm: Secondary | ICD-10-CM | POA: Insufficient documentation

## 2017-02-17 DIAGNOSIS — Z79899 Other long term (current) drug therapy: Secondary | ICD-10-CM | POA: Diagnosis not present

## 2017-02-17 DIAGNOSIS — K219 Gastro-esophageal reflux disease without esophagitis: Secondary | ICD-10-CM | POA: Insufficient documentation

## 2017-02-17 DIAGNOSIS — I6930 Unspecified sequelae of cerebral infarction: Secondary | ICD-10-CM | POA: Diagnosis not present

## 2017-02-17 DIAGNOSIS — M791 Myalgia: Secondary | ICD-10-CM | POA: Diagnosis not present

## 2017-02-17 DIAGNOSIS — R531 Weakness: Secondary | ICD-10-CM | POA: Diagnosis not present

## 2017-02-17 DIAGNOSIS — R4781 Slurred speech: Secondary | ICD-10-CM | POA: Diagnosis not present

## 2017-02-17 MED ORDER — CETIRIZINE HCL 10 MG PO TABS: 10.0000 mg | ORAL_TABLET | Freq: Every day | ORAL | 6 refills | Status: DC

## 2017-02-17 MED ORDER — OMEPRAZOLE 10 MG PO CPDR: 10.0000 mg | DELAYED_RELEASE_CAPSULE | Freq: Every day | ORAL | 2 refills | Status: DC

## 2017-02-17 MED ORDER — ASPIRIN EC 81 MG PO TBEC: 81.0000 mg | DELAYED_RELEASE_TABLET | Freq: Every day | ORAL | 2 refills | Status: DC

## 2017-02-17 MED ORDER — CAPSAICIN 0.025 % EX CREA: TOPICAL_CREAM | Freq: Two times a day (BID) | CUTANEOUS | 0 refills | Status: DC | PRN

## 2017-02-17 MED FILL — ?CETIRIZINE HCL 10 MG TABLE: 10 | 30 days supply | Qty: 30 | Fill #0

## 2017-02-17 NOTE — Progress Notes (Signed)
Patient is here for establish care  Patient complains about foot legs hands, and arm pain  Patient is here for medication refills  Patient has eaten for today  Patient has not taking medication for today

## 2017-02-17 NOTE — Patient Instructions (Signed)
You will be called with your lab results   Hypothyroidism Hypothyroidism is a disorder of the thyroid. The thyroid is a large gland that is located in the lower front of the neck. The thyroid releases hormones that control how the body works. With hypothyroidism, the thyroid does not make enough of these hormones. What are the causes? Causes of hypothyroidism may include:  Viral infections.  Pregnancy.  Your own defense system (immune system) attacking your thyroid.  Certain medicines.  Birth defects.  Past radiation treatments to your head or neck.  Past treatment with radioactive iodine.  Past surgical removal of part or all of your thyroid.  Problems with the gland that is located in the center of your brain (pituitary). What are the signs or symptoms? Signs and symptoms of hypothyroidism may include:  Feeling as though you have no energy (lethargy).  Inability to tolerate cold.  Weight gain that is not explained by a change in diet or exercise habits.  Dry skin.  Coarse hair.  Menstrual irregularity.  Slowing of thought processes.  Constipation.  Sadness or depression. How is this diagnosed? Your health care provider may diagnose hypothyroidism with blood tests and ultrasound tests. How is this treated? Hypothyroidism is treated with medicine that replaces the hormones that your body does not make. After you begin treatment, it may take several weeks for symptoms to go away. Follow these instructions at home:  Take medicines only as directed by your health care provider.  If you start taking any new medicines, tell your health care provider.  Keep all follow-up visits as directed by your health care provider. This is important. As your condition improves, your dosage needs may change. You will need to have blood tests regularly so that your health care provider can watch your condition. Contact a health care provider if:  Your symptoms do not get better  with treatment.  You are taking thyroid replacement medicine and:  You sweat excessively.  You have tremors.  You feel anxious.  You lose weight rapidly.  You cannot tolerate heat.  You have emotional swings.  You have diarrhea.  You feel weak. Get help right away if:  You develop chest pain.  You develop an irregular heartbeat.  You develop a rapid heartbeat. This information is not intended to replace advice given to you by your health care provider. Make sure you discuss any questions you have with your health care provider. Document Released: 11/04/2005 Document Revised: 04/11/2016 Document Reviewed: 03/22/2014 Elsevier Interactive Patient Education  2017 [employer].

## 2017-02-17 NOTE — Progress Notes (Signed)
Subjective:  Patient ID: Theresa Watkins, female    DOB: [birth year]  Age: 51 y.o. MRN: [id number]  CC: Establish Care   HPI MADALIN HUGHART presents for   HTN: History of CVA 1 year ago. She reports being without her medications for several days. She denies any chest pain, shortness of breath, swelling of the bilateral lower extremities, or dizziness. She reports history of residual with CVA. Symptoms include right-sided weakness and slurring of speech. Reports history of 3 physical therapy sessions in the past.   Musculoskeletal pain: Aching pain to the right arm. Denies any history of injury, paresthesias, or swelling. Reports right sided weakness and decreased arm strength since CVA. Denies taking anything for symptoms.   Hypothyroidism: She reports being without her medication for several days.   Eye concern: Reports raised area and her left lower inner eyelid. Denies any vision changes, eye pain, or discharge. History of decreased visual acuity with corrective lens use.   Health maintanance: Breast cancer screening offered. Patient later declined.   Outpatient Medications Prior to Visit  Medication Sig Dispense Refill  . Calcium Carbonate-Vitamin D (CALCIUM + D PO) Take 2 tablets by mouth 2 (two) times daily.     Marland Kitchen aspirin EC 325 MG EC tablet Take 1 tablet (325 mg total) by mouth daily. 30 tablet 0  . cetirizine (ZYRTEC) 10 MG tablet Take 1 tablet (10 mg total) by mouth daily. 30 tablet 0  . omeprazole (PRILOSEC) 20 MG capsule Take 1 capsule (20 mg total) by mouth daily. 30 capsule 0  . simvastatin (ZOCOR) 20 MG tablet Take 1 tablet (20 mg total) by mouth daily at 6 PM. 30 tablet 2  . levothyroxine (SYNTHROID, LEVOTHROID) 125 MCG tablet Take 1 tablet (125 mcg total) by mouth daily before breakfast. 28 tablet 0   No facility-administered medications prior to visit.     ROS Review of Systems  Eyes:       Eye lesion  Respiratory: Negative.   Cardiovascular: Negative.     Gastrointestinal: Negative.   Endocrine: Negative.   Musculoskeletal: Positive for myalgias (& muscle weakness).    Objective:  BP 110/76 (BP Location: Left Arm, Patient Position: Sitting, Cuff Size: Normal)   Pulse 76   Temp 98.1 F (36.7 C) (Oral)   Resp 18   Ht 5\' 5"  (1.651 m)   Wt 161 lb 3.2 oz (73.1 kg)   SpO2 96%   BMI 26.83 kg/m   BP/Weight 02/17/2017 01/13/2017 5/45/6256  Systolic BP 389 373 428  Diastolic BP 76 82 768  Wt. (Lbs) 161.2 - -  BMI 26.83 - 31.8    Physical Exam  HENT:  Head: Normocephalic and atraumatic.  Right Ear: External ear normal.  Left Ear: External ear normal.  Nose: Nose normal.  Mouth/Throat: Oropharynx is clear and moist.  Eyes: EOM are normal. Pupils are equal, round, and reactive to light.  chalazion to left, lower, inner conjunctivae.   Neck: No JVD present.  Cardiovascular: Normal rate, regular rhythm, normal heart sounds and intact distal pulses.   Pulmonary/Chest: Effort normal and breath sounds normal.  Abdominal: Soft. Bowel sounds are normal.  Musculoskeletal:  4/5 muscle strength. Decreased hand grip.   Neurological: She has normal reflexes.  Skin: Skin is warm and dry.  Psychiatric: Her affect is labile. Her speech is slurred. She is agitated.  Nursing note and vitals reviewed.   Assessment & Plan:   Problem List Items Addressed This Visit    None  Visit Diagnoses    History of CVA with residual deficit    -  Primary   Relevant Medications   aspirin EC 81 MG tablet   Other Relevant Orders   Ambulatory referral to Physical Therapy   Lipid Panel (Completed)   Right sided weakness       Relevant Orders   Ambulatory referral to Physical Therapy   Acquired hypothyroidism       Relevant Orders   TSH (Completed)   Musculoskeletal arm pain, right       Relevant Medications   capsaicin (ZOSTRIX) 0.025 % cream   Musculoskeletal pain       -NSAIDs and acetaminophen offered for pain, patient reports concerns taken  these medications    with her current medications. Medications discussed with patient. Patient still declines medications.    Relevant Medications   capsaicin (ZOSTRIX) 0.025 % cream   Other Relevant Orders   Vitamin D, 25-hydroxy (Completed)   Decreased visual acuity       Relevant Orders   Ambulatory referral to Ophthalmology   Gastroesophageal reflux disease without esophagitis       Relevant Medications   omeprazole (PRILOSEC) 10 MG capsule   Chalazion of left lower eyelid       Relevant Orders   Ambulatory referral to Ophthalmology      Meds ordered this encounter  Medications  . DISCONTD: cetirizine (ZYRTEC) 10 MG tablet    Sig: Take 1 tablet (10 mg total) by mouth daily.    Dispense:  30 tablet    Refill:  6    Order Specific Question:   Supervising Provider    Answer:   Tresa Garter [id number]  . DISCONTD: aspirin EC 81 MG tablet    Sig: Take 1 tablet (81 mg total) by mouth daily.    Dispense:  30 tablet    Refill:  2    Order Specific Question:   Supervising Provider    Answer:   Tresa Garter [id number]  . DISCONTD: omeprazole (PRILOSEC) 10 MG capsule    Sig: Take 1 capsule (10 mg total) by mouth daily.    Dispense:  30 capsule    Refill:  2    Order Specific Question:   Supervising Provider    Answer:   Tresa Garter [id number]  . DISCONTD: capsaicin (ZOSTRIX) 0.025 % cream    Sig: Apply topically 2 (two) times daily as needed.    Dispense:  45 g    Refill:  0    Order Specific Question:   Supervising Provider    Answer:   Tresa Garter [id number]  . aspirin EC 81 MG tablet    Sig: Take 1 tablet (81 mg total) by mouth daily.    Dispense:  30 tablet    Refill:  2  . capsaicin (ZOSTRIX) 0.025 % cream    Sig: Apply topically 2 (two) times daily as needed.    Dispense:  45 g    Refill:  0  . cetirizine (ZYRTEC) 10 MG tablet    Sig: Take 1 tablet (10 mg total) by mouth daily.    Dispense:  30 tablet    Refill:  6  . omeprazole  (PRILOSEC) 10 MG capsule    Sig: Take 1 capsule (10 mg total) by mouth daily.    Dispense:  30 capsule    Refill:  2    Follow-up: Return in about 8 weeks (around 04/14/2017) for Hypothyroidism.   Marlo Arriola R  Braulio Conte FNP

## 2017-02-18 ENCOUNTER — Other Ambulatory Visit: Payer: Self-pay | Admitting: Family Medicine

## 2017-02-18 ENCOUNTER — Encounter: Payer: Self-pay | Admitting: Family Medicine

## 2017-02-18 DIAGNOSIS — E782 Mixed hyperlipidemia: Secondary | ICD-10-CM

## 2017-02-18 DIAGNOSIS — Z1231 Encounter for screening mammogram for malignant neoplasm of breast: Secondary | ICD-10-CM

## 2017-02-18 DIAGNOSIS — E039 Hypothyroidism, unspecified: Secondary | ICD-10-CM

## 2017-02-18 DIAGNOSIS — E559 Vitamin D deficiency, unspecified: Secondary | ICD-10-CM

## 2017-02-18 LAB — LIPID PANEL
CHOLESTEROL TOTAL: 213 mg/dL — AB (ref 100–199)
Chol/HDL Ratio: 3.3 ratio (ref 0.0–4.4)
HDL: 64 mg/dL (ref >39)
LDL CALC: 133 mg/dL — AB (ref 0–99)
Triglycerides: 78 mg/dL (ref 0–149)
VLDL CHOLESTEROL CAL: 16 mg/dL (ref 5–40)

## 2017-02-18 LAB — VITAMIN D 25 HYDROXY (VIT D DEFICIENCY, FRACTURES): VIT D 25 HYDROXY: 9.4 ng/mL — AB (ref 30.0–100.0)

## 2017-02-18 LAB — TSH: TSH: 5.13 u[IU]/mL — ABNORMAL HIGH (ref 0.450–4.500)

## 2017-02-18 MED ORDER — SIMVASTATIN 20 MG PO TABS: 20.0000 mg | ORAL_TABLET | Freq: Every day | ORAL | 0 refills | Status: DC

## 2017-02-18 MED ORDER — VITAMIN D (ERGOCALCIFEROL) 1.25 MG (50000 UNIT) PO CAPS: 50000.0000 [IU] | ORAL_CAPSULE | ORAL | 0 refills | Status: AC

## 2017-02-18 MED ORDER — LEVOTHYROXINE SODIUM 150 MCG PO TABS: 150.0000 ug | ORAL_TABLET | Freq: Every day | ORAL | 0 refills | Status: DC

## 2017-02-19 ENCOUNTER — Telehealth: Payer: Self-pay

## 2017-02-19 MED FILL — ?LEVOTHYROXINE 150 MCG TAB: 150 | 30 days supply | Qty: 30 | Fill #0

## 2017-02-19 MED FILL — VIT D2 1.25 MG (50,000 UNIT: 1.25 MG | 28 days supply | Qty: 4 | Fill #0

## 2017-02-19 MED FILL — ?SIMVASTATIN 20 MG TABLET: 20 MG | 30 days supply | Qty: 30 | Fill #0

## 2017-02-19 NOTE — Telephone Encounter (Signed)
CMA call to inform about lab results  Patient did not answer & there was no VM set up

## 2017-02-19 NOTE — Telephone Encounter (Signed)
-----   Message from Alfonse Spruce, [place] sent at 02/18/2017  5:37 PM EDT ----- Thyroid function is decreased. Your dose of levothyroxine will be increased. Recommend recheck in 6 weeks. Vitamin D level was low. Vitamin D helps to keep bones strong. You were prescribed ergocalciferol (capsules) to increase your vitamin-d level. Once finished start taking OTC vitamin d supplement with 800 international units (IU) of vitamin-d per day. Recommend recheck in 3 months. Lipid levels were elevated. This can increase your risk of heart disease overtime. You will be prescribed simvastatin to help  lower your risk.

## 2017-02-19 NOTE — Telephone Encounter (Signed)
Pt contacted the office and stated someone called her I went over lab results. Pt is aware of results. Pt wants to know how did her thyroid function go up. If you could please f/u

## 2017-02-19 NOTE — Telephone Encounter (Signed)
CMA second try on calling the patient to inform about lab results  Patient did not answer & there is no VM set up   CMA tried to call second number on file left a VM on that number stating the reason of the call & to call me back

## 2017-02-19 NOTE — Telephone Encounter (Signed)
-----   Message from Alfonse Spruce, [place] sent at 02/18/2017  5:37 PM EDT ----- Thyroid function is decreased. Your dose of levothyroxine will be increased. Recommend recheck in 6 weeks. Vitamin D level was low. Vitamin D helps to keep bones strong. You were prescribed ergocalciferol (capsules) to increase your vitamin-d level. Once finished start taking OTC vitamin d supplement with 800 international units (IU) of vitamin-d per day. Recommend recheck in 3 months. Lipid levels were elevated. This can increase your risk of heart disease overtime. You will be prescribed simvastatin to help  lower your risk.

## 2017-02-20 NOTE — Telephone Encounter (Signed)
CMA call patient to inform why she needs to take the dosage of what there pcp prescribe her  Patient was aware and understood & did not had any concerns or any question after I explain to her the reason

## 2017-02-20 NOTE — Telephone Encounter (Signed)
Please inform patient that labs indicate she hypothyroidism. To improve her thyroid levels she need to take her thyroid medications as prescribed and follow up with routine lab follow up.

## 2017-02-20 NOTE — Telephone Encounter (Signed)
CMA call patient to inform patient why her thyroid levels are high    Patient was aware and understood

## 2017-02-20 NOTE — Telephone Encounter (Signed)
Pt contacted the office and stated someone called her Susette Racer went over lab results. Pt is aware of results. Pt wants to know how did her thyroid function go up. If you could please advice

## 2017-02-20 NOTE — Telephone Encounter (Signed)
I prescribed her the dosage based on her thyroid levels. Levothyoxine is a medication that is dosed based on thyroid labs, which means the dosage changes.   * If she has any further questions about her medications. Please refer her to the clinical pharmacist. *

## 2017-02-20 NOTE — Telephone Encounter (Signed)
Spoke with patient & she asked if you can prescribed her regular dose that was 125 mcg instead of the 150 mcg please advice?

## 2017-02-22 IMAGING — CT CT HEAD W/O CM
2 series · 15 of 30 positions shown, 17 images · non-contrast
Comparison: 12/24/2015. Multiple previous examinations including
MRI 10/30/2010.

CLINICAL DATA: Stroke.  Headache.  Seizures.  Right arm weakness.

EXAM:
CT HEAD WITHOUT CONTRAST
TECHNIQUE: Contiguous axial images were obtained from the base of the skull
through the vertex without intravenous contrast.

[Series 2: head without · axial · non-contrast · 0.43mm/px · z∈[-122,-2]mm · 7 of 33 slices shown, 9 images]
[im 5/33  brain]
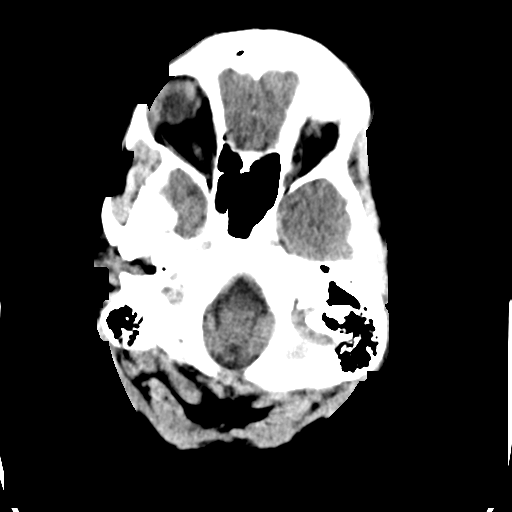
[im 5/33  bone]
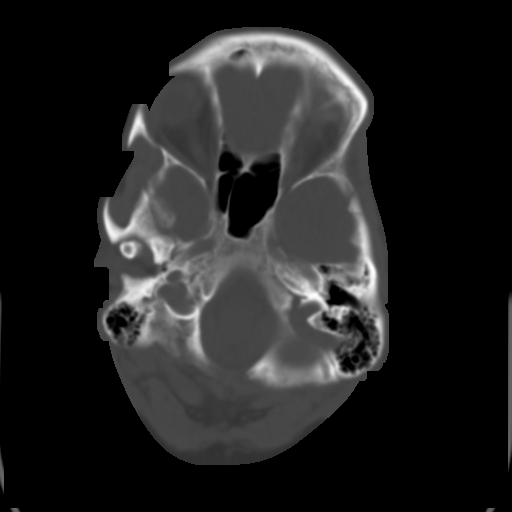
[im 9/33  brain]
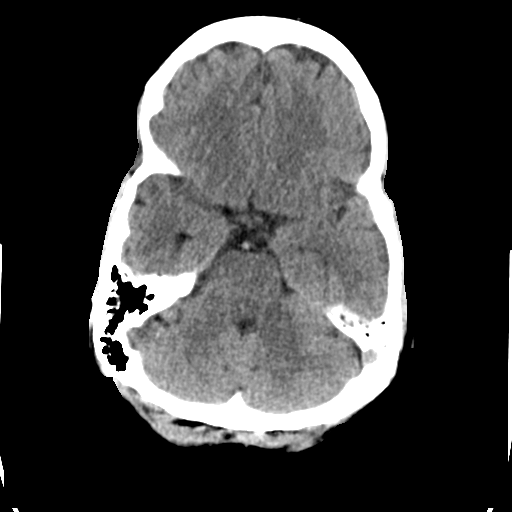
[im 13/33  brain]
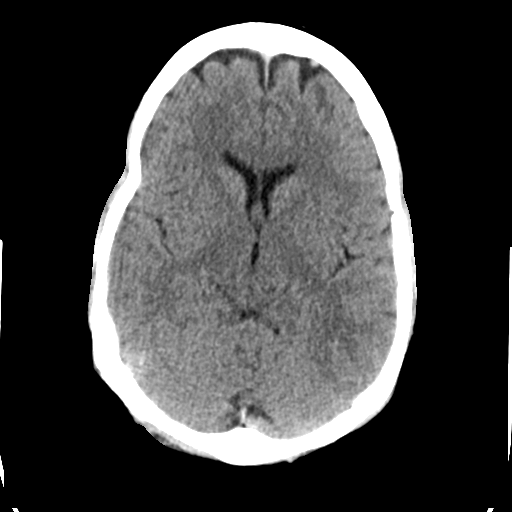
[im 17/33  brain]
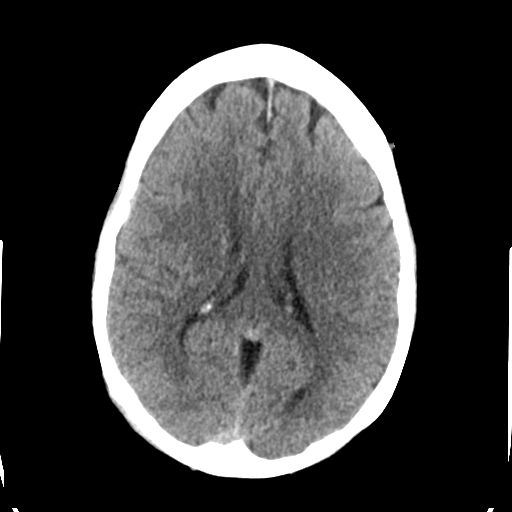
[im 21/33  brain]
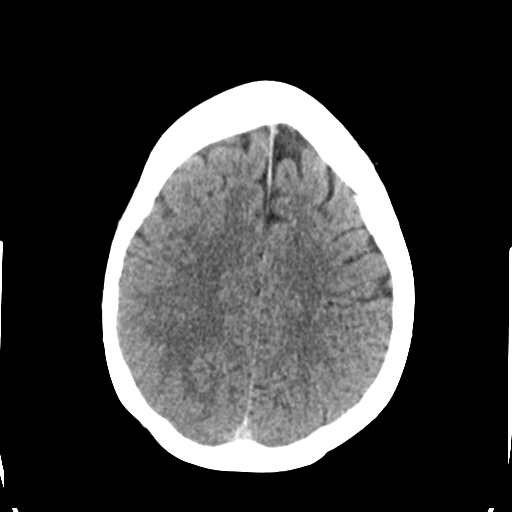
[im 21/33  bone]
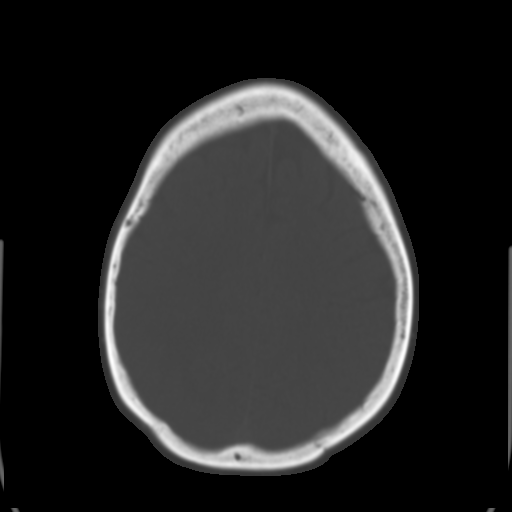
[im 25/33  brain]
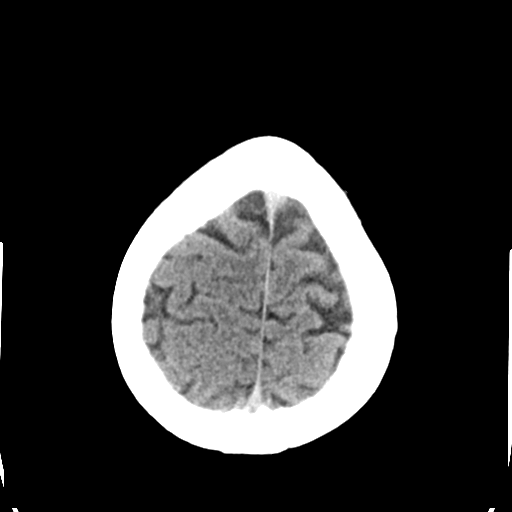
[im 29/33  brain]
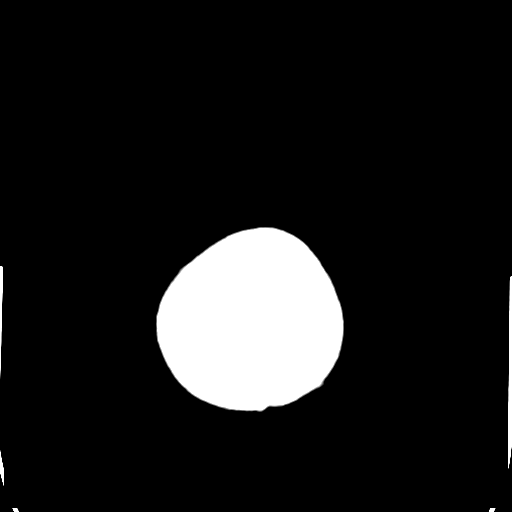

[Series 3: head bone · axial · 0.43mm/px · z∈[-126,+2]mm · 8 of 81 slices shown]
[im 9/81  bone]
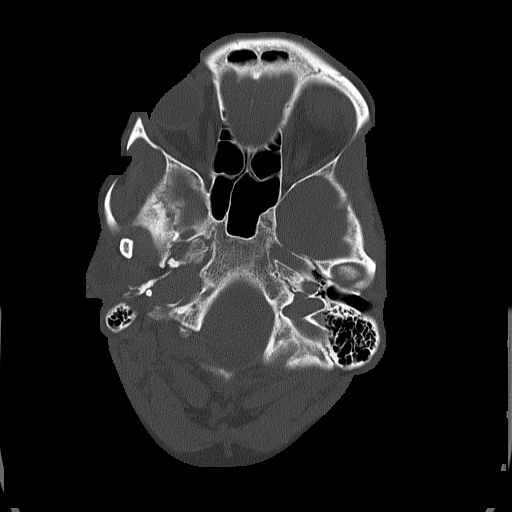
[im 17/81  bone]
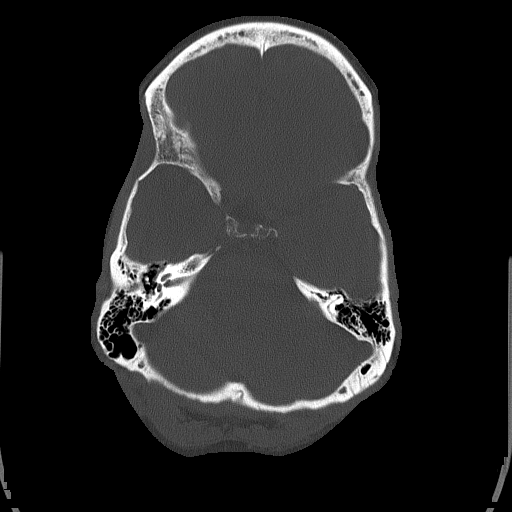
[im 25/81  bone]
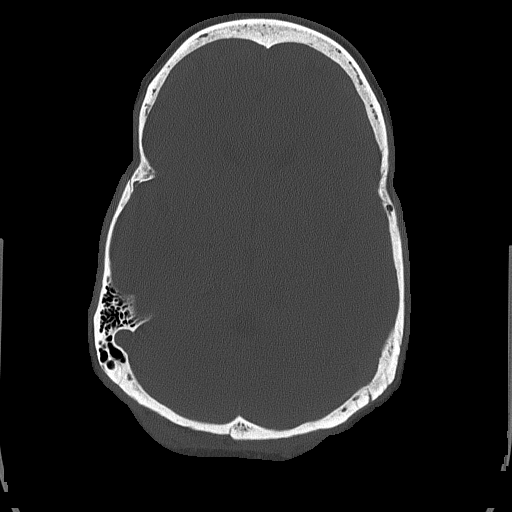
[im 37/81  bone]
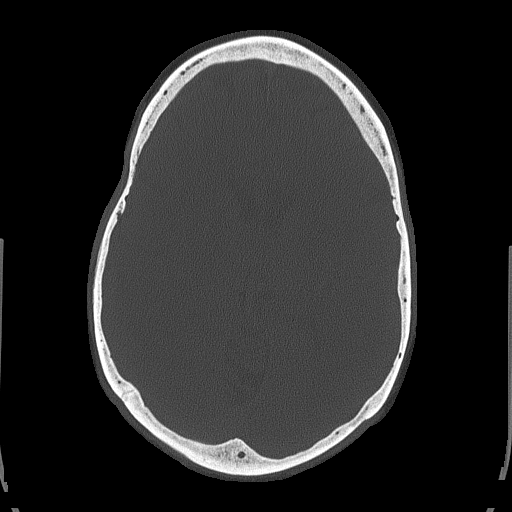
[im 45/81  bone]
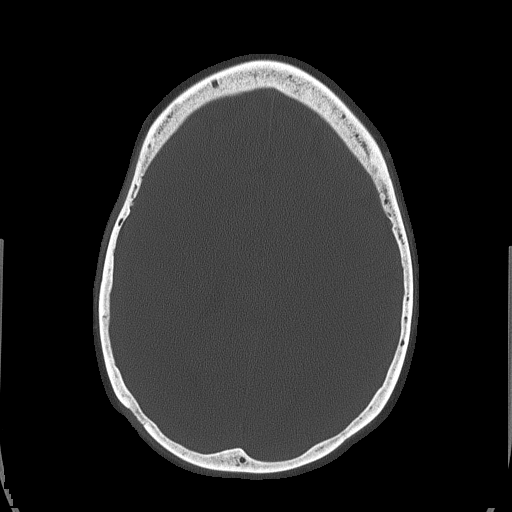
[im 57/81  bone]
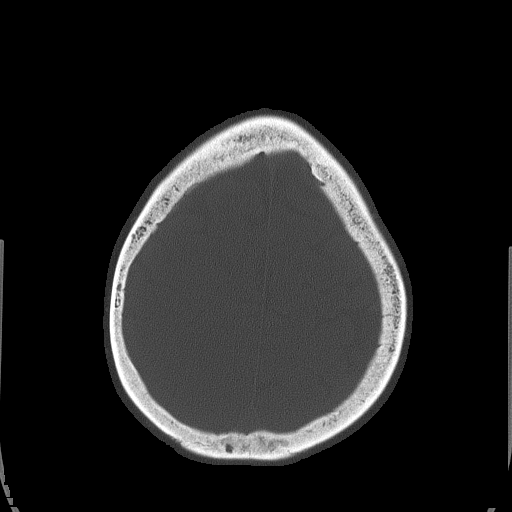
[im 65/81  bone]
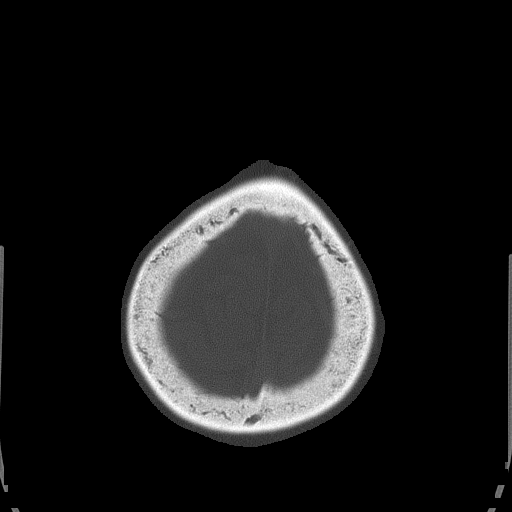
[im 73/81  bone]
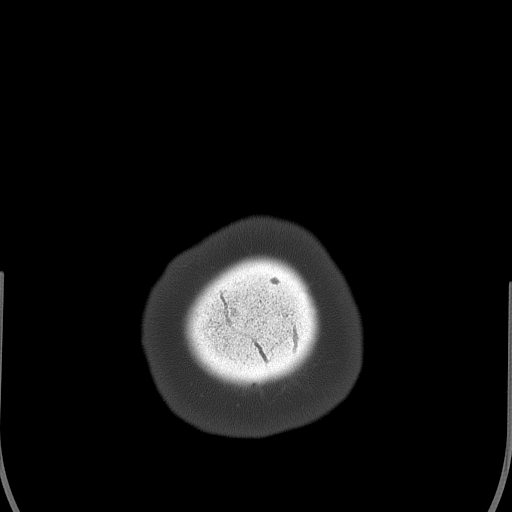

[15 of 30 positions shown; findings below may reference images not displayed]

FINDINGS: The brainstem and cerebellum again appear normal. Cerebral
hemispheres appear normal MK by CT with the exception of new
demonstration of a 3-4 mm hyperdense focus in the left caudate
head/anterior body which could represent a tiny infarction with
microhemorrhage. No evidence of cortical or large vessel territory
infarction. No mass lesion, hemorrhage, hydrocephalus or extra-axial
collection. No skull or skullbase lesion. Sinuses are clear.
IMPRESSION: Question 3-4 mm hyperdense focus in the left caudate head/ anterior
body that could represent a small-vessel caudate infarction with
microhemorrhage. This is not absolutely definite. No other focal
finding by CT.

## 2017-02-23 IMAGING — CR DG CHEST 1V
1 series · 1 of 1 positions shown · non-contrast
Comparison: 10/30/2010 and earlier.

CLINICAL DATA: 49-year-old female with right upper chest pain since
2200 hours today. Initial encounter.

EXAM:
CHEST 1 VIEW

[AP]
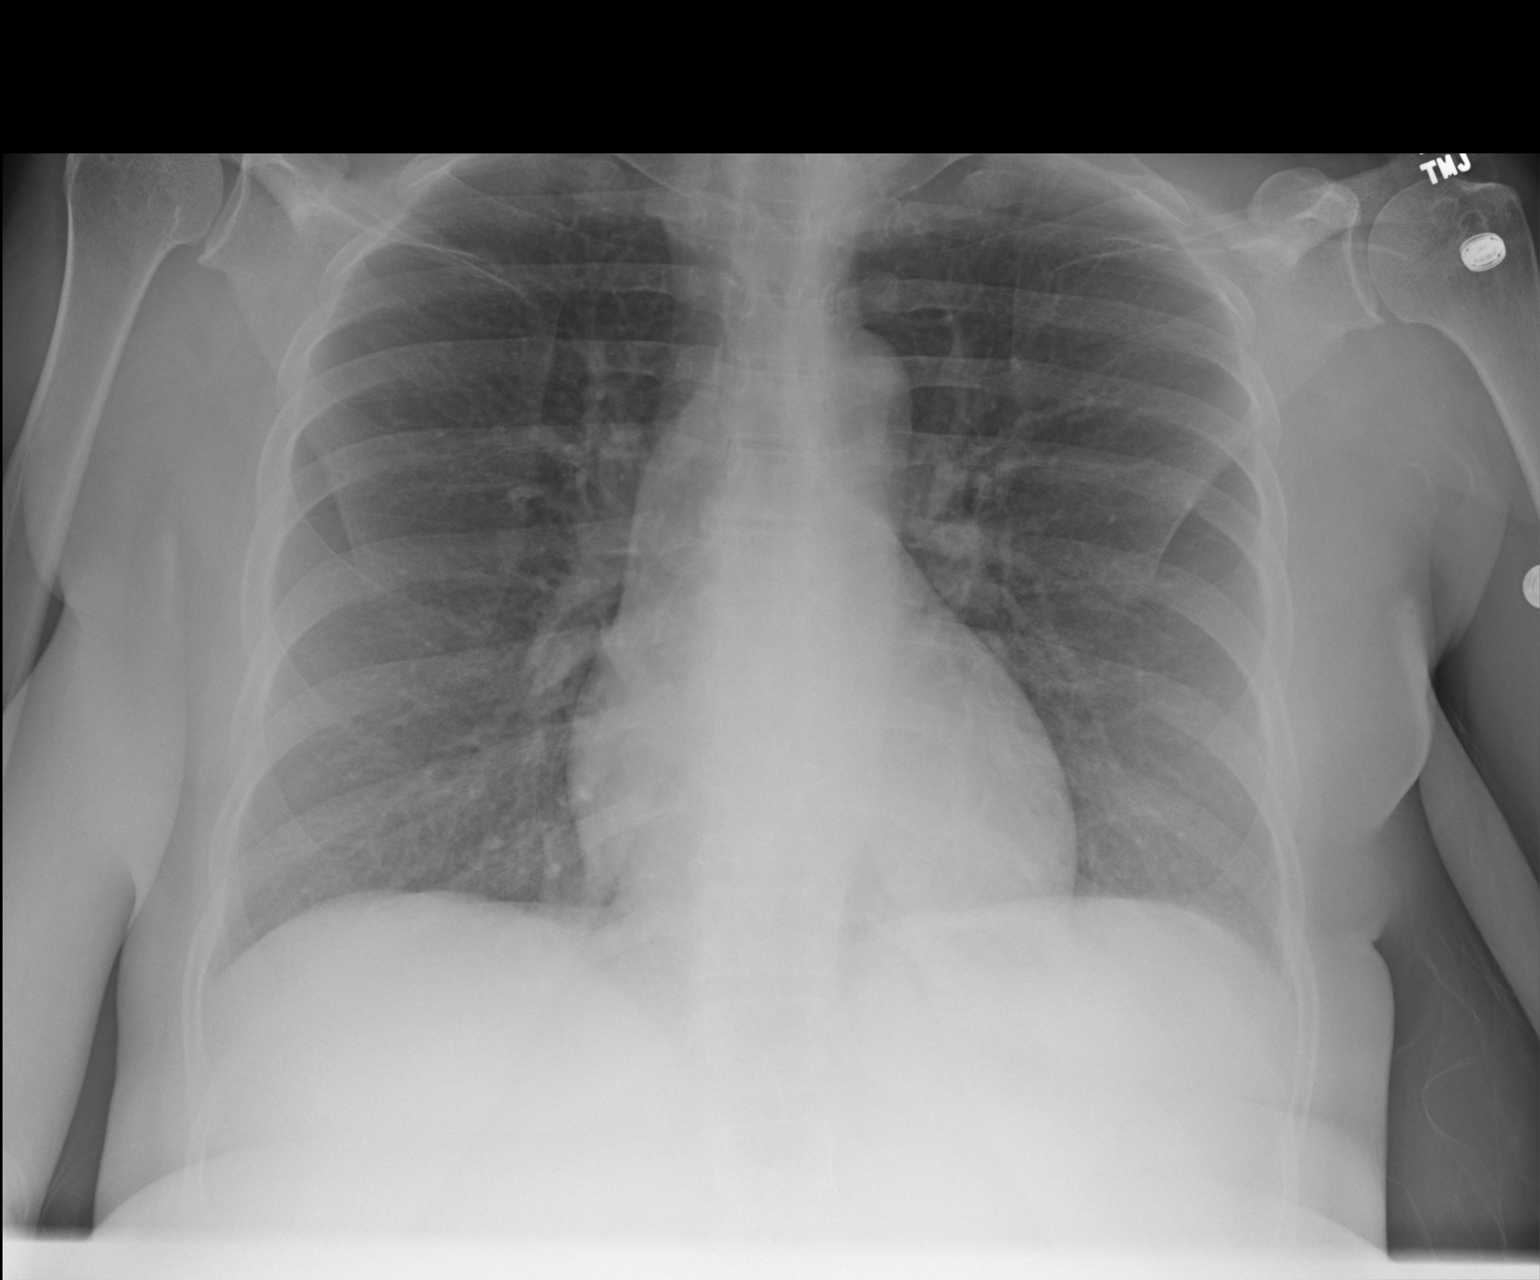

[1 of 1 positions shown; findings below may reference images not displayed]

FINDINGS: Portable AP semi upright view at 9994 hours. Lung volumes are stable
and within normal limits. Normal cardiac size and mediastinal
contours. Visualized tracheal air column is within normal limits. No
pneumothorax, pulmonary edema, pleural effusion or confluent
pulmonary opacity.
IMPRESSION: No acute cardiopulmonary abnormality.

## 2017-02-27 ENCOUNTER — Telehealth: Payer: Self-pay | Admitting: *Deleted

## 2017-02-27 NOTE — Telephone Encounter (Signed)
Spoke with patient re: follow up tomorrow. Advised she is seen here for stroke follow up, not back and leg pain. Patient stated she was scheduled for her regular stroke FU in May but requested it be moved up sooner. She stated her FNP suggested she needed an MRI because she didn't have one following her stroke. Patient reported episodes of blurred vision, difficulty with left arm, migraines, headaches, and difficulty swallowing at times. Patient stated she did have PT "next door". This RN advised she needs to be at office at 7:30 am to check in unless this RN calls her back later today. Patient verbalized understanding, appreciation.

## 2017-02-28 ENCOUNTER — Ambulatory Visit: Payer: Self-pay | Admitting: Nurse Practitioner

## 2017-03-13 ENCOUNTER — Ambulatory Visit: Payer: Medicaid Other

## 2017-03-21 ENCOUNTER — Encounter: Payer: Self-pay | Admitting: Nurse Practitioner

## 2017-03-21 ENCOUNTER — Ambulatory Visit (INDEPENDENT_AMBULATORY_CARE_PROVIDER_SITE_OTHER): Payer: Medicaid Other | Admitting: Nurse Practitioner

## 2017-03-21 VITALS — BP 119/72 | HR 65 | Ht 65.0 in | Wt 161.2 lb

## 2017-03-21 DIAGNOSIS — I69359 Hemiplegia and hemiparesis following cerebral infarction affecting unspecified side: Secondary | ICD-10-CM | POA: Diagnosis not present

## 2017-03-21 DIAGNOSIS — E785 Hyperlipidemia, unspecified: Secondary | ICD-10-CM

## 2017-03-21 DIAGNOSIS — I639 Cerebral infarction, unspecified: Secondary | ICD-10-CM | POA: Diagnosis not present

## 2017-03-21 DIAGNOSIS — R51 Headache: Secondary | ICD-10-CM | POA: Diagnosis not present

## 2017-03-21 DIAGNOSIS — R519 Headache, unspecified: Secondary | ICD-10-CM | POA: Insufficient documentation

## 2017-03-21 DIAGNOSIS — Z720 Tobacco use: Secondary | ICD-10-CM

## 2017-03-21 DIAGNOSIS — F1410 Cocaine abuse, uncomplicated: Secondary | ICD-10-CM | POA: Diagnosis not present

## 2017-03-21 MED FILL — ?CETIRIZINE HCL 10 MG TABLE: 10 | 30 days supply | Qty: 30 | Fill #1

## 2017-03-21 MED FILL — ?LEVOTHYROXINE 150 MCG TAB: 150 | 30 days supply | Qty: 30 | Fill #1

## 2017-03-21 MED FILL — VIT D2 1.25 MG (50,000 UNIT: 1.25 MG | 28 days supply | Qty: 4 | Fill #1

## 2017-03-21 NOTE — Patient Instructions (Addendum)
Continue aspirin 325 mg daily for secondary stroke prevention  maintain strict control of hypertension with blood pressure goal below 130/90,  todays reading 119/72 lipids with LDL cholesterol goal below 70 mg/dL. Continue Zocor labs followed by PCP Eat a healthy diet with plenty of whole grains, cereals, fruits and vegetables,  Continue Rehab services and HEP. MRI of the brain schedule on the way out F/U 3 months Stop cocaine use

## 2017-03-21 NOTE — Progress Notes (Signed)
[facility]  PATIENT: Theresa Watkins DOB: 1966/11/03   REASON FOR VISIT:Follow-up for lacunar infarction February 2017, new complaint of headache HISTORY FROM: Patient    HISTORY OF PRESENT ILLNESS: UPDATE 5/4/ 2018. 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 She is currently on aspirin for secondary stroke prevention with no bruising and no bleeding. She has not had further stroke or TIA symptoms. She does complain with headaches on the left side which are severe at times. She has photophobia and phonophobia. No nausea or vomiting. Patient did not have an MRI when she had her stroke event because she refused. She is asking for MRI now  due to onset of headaches. She remains on Zocor for hyperlipidemia. She denies any myalgias. She continues to have weakness on her right side and she continues to go to therapy. She is continuing to smoke a few cigarettes a day and admits to using cocaine at least monthly. She returns for reevaluation    11/13/2017CM Theresa. Watkins, 51 year old female returns for follow-up. She has a history of  small hemorrhagic left basal ganglia infarct which occurred in February 2017. She is currently on aspirin for secondary stroke prevention without recurrent stroke or TIA symptoms. She has no bruising and no bleeding .She continues to complain of visual problems she has not  seen an ophthalmologist as suggested. She continues to have some weakness on the right side she claims she has just gotten her Medicaid. Physical therapy has been ordered for her previously. She is continuing to smoke 2 cigarettes a day and denies alcohol or cocaine drug use. She remains on Zocor. She returns for reevaluation  UPDATE 07/13/2017CM  Theresa.Watkins, 51 year old female returns for follow-up last seen in the office by Dr. Leonie Man 02/28/2016 for hospital follow-up for stroke. She remains on aspirin and  Zocor for secondary stroke prevention without recurrent stroke or TIA symptoms. She does complain of blurred vision today and she has not seen an ophthalmologist in over 30 years. She also complains of right shoulder pain, seen in the emergency room on 04/30/2016. Right shoulder x-ray with mild degenerative joint disease. She had therapies ordered when last seen however she just got her Medicaid  Yesterday so she will follow-up for some strengthening exercises. She continues to smoke and was encouraged to quit she also has a history of cocaine use. She denies any recent cocaine use. She complains with headaches today  posterior headaches which she describes as moderate in severity pulling sensation and appears to be more pronounced with neck movements.and says the only thing that helps his Vicodin. She was made aware we do not prescribe narcotic medications for headaches. She does not want to be on a preventive. She also complains of vaginal bleeding and was made aware she needs to see OB/GYN. Her speech has improved. She returns for reevaluation   HISTORY 02/28/16 PS49 year African-American lady seen today for the first office follow-up visit for hospital admission for stroke in February 2017. Theresa Watkins is an 51 y.o. female with a history of hypertension, thyroid cancer and cocaine use, as well as reported history of stroke 7 years ago, presenting with new onset right facial and upper extremity weakness, as well as slurred speech. Patient also has had several falls over the past couple of days. She also admits to use of cocaine last night. Urine drug screen was positive for cocaine. There is an equivocal history  of transient right lower extremity weakness. Information obtained from the patient is somewhat unreliable. CT scan of her head was unremarkable with no acute intracranial abnormality. NIH stroke score was 8. Her last known well was unclear. Patient was not administered TPA secondary to  unknown last known well. She was admitted for further evaluation and treatment. Initial CT scan of the head on admission showed no definite infarct. Patient did not tolerate having an MRI done. Repeat CT scan of the head showed a small hemorrhagic left basal ganglia infarct. Transthoracic echo showed normal ejection fraction with a to left septal aneurysm with possibly a small patent foramen ovale. Carotid ultrasound showed no significant extracranial stenosis. Urine drug screen was positive for cocaine. Hemoglobin A1c was 5.6. LDL cholesterol was 119 and total cholesterol 181. Patient was counseled to quit smoking cigarettes and cocaine. She was started on Zocor for hyperlipidemia and aspirin. She went to inpatient rehabilitation and subsequently has been at home is received inpatient therapy but outpatient therapy has not yet been started. She states her speech has improved though at times she still has trouble speaking completing sentences. She also has significant right hand weakness with diminished fine motor skills. Her right leg also tracks that time. She also has some posterior headaches which he describes as moderate in severity pulling sensation and appears to be more pronounced with neck movements. She is tolerating aspirin without bleeding or bruising and Zocor without muscle aches or pains. She quit smoking for a month but has recently restarted but smokes only a quarter pack per day. She states she is stop doing cocaine.   REVIEW OF SYSTEMS: Full 14 system review of systems performed and notable only for those listed, all others are neg:  Constitutional: neg  Cardiovascular: neg Ear/Nose/Throat: neg  Skin: neg Eyes: neg Respiratory: neg Gastroitestinal: Urinary frequency  Hematology/Lymphatic: neg  Endocrine: Excessive thirst Musculoskeletal: Walking difficulty, joint pain Allergy/Immunology: neg Neurological: Headache Psychiatric: Depression Sleep : neg   ALLERGIES: Allergies   Allergen Reactions  . Iohexol Hives, Itching and Other (See Comments)     Code: HIVES, Desc: pts tongue began itching post injection and throat burning, Onset Date: [id number]   . Proanthocyanidin Swelling    Swelling of the tongue  . Diphenhydramine Hcl Rash  . Percocet [Oxycodone-Acetaminophen] Rash  . Tramadol Rash    HOME MEDICATIONS: Outpatient Medications Prior to Visit  Medication Sig Dispense Refill  . Calcium Carbonate-Vitamin D (CALCIUM + D PO) Take 2 tablets by mouth 2 (two) times daily.     . capsaicin (ZOSTRIX) 0.025 % cream Apply topically 2 (two) times daily as needed. 45 g 0  . cetirizine (ZYRTEC) 10 MG tablet Take 1 tablet (10 mg total) by mouth daily. 30 tablet 6  . levothyroxine (SYNTHROID, LEVOTHROID) 150 MCG tablet Take 1 tablet (150 mcg total) by mouth daily before breakfast. 60 tablet 0  . omeprazole (PRILOSEC) 10 MG capsule Take 1 capsule (10 mg total) by mouth daily. 30 capsule 2  . simvastatin (ZOCOR) 20 MG tablet Take 1 tablet (20 mg total) by mouth at bedtime. 90 tablet 0  . aspirin EC 81 MG tablet Take 1 tablet (81 mg total) by mouth daily. 30 tablet 2  . Vitamin D, Ergocalciferol, (DRISDOL) 50000 units CAPS capsule Take 1 capsule (50,000 Units total) by mouth every 7 (seven) days. (Patient not taking: Reported on 03/21/2017) 12 capsule 0   No facility-administered medications prior to visit.     PAST MEDICAL HISTORY: Past Medical  History:  Diagnosis Date  . Abnormal Pap smear   . Bronchitis   . Fibroid   . Headache(784.0)   . Ovarian cyst   . Seizures ([place])   . Stroke ([place])   . Thyroid cancer ([place]) [address]11  . Trichomonas   . Urinary tract infection     PAST SURGICAL HISTORY: Past Surgical History:  Procedure Laterality Date  . ABDOMINAL HYSTERECTOMY    . CESAREAN SECTION    . goiter     removed  . laparoscopic  1988   removal  of ectopic preg, ruptured tube  . THERAPEUTIC ABORTION    . THYROIDECTOMY  march 2011   cancer    FAMILY  HISTORY: Family History  Problem Relation Age of Onset  . Bell's palsy Mother   . Cancer Father   . Stroke Father     SOCIAL HISTORY: Social History   Social History  . Marital status: Divorced    Spouse name: N/A  . Number of children: N/A  . Years of education: N/A   Occupational History  . Not on file.   Social History Main Topics  . Smoking status: Light Tobacco Smoker    Packs/day: 0.10    Years: 25.00    Types: Cigarettes  . Smokeless tobacco: Never Used     Comment: smoke 2 a day trying to quit  . Alcohol use 0.0 oz/week     Comment: occ  . Drug use: Yes    Types: Cocaine     Comment: 1-2x monthly  . Sexual activity: Not Currently    Birth control/ protection: Surgical   Other Topics Concern  . Not on file   Social History Narrative  . No narrative on file     PHYSICAL EXAM  Vitals:   03/21/17 0859  BP: 119/72  Pulse: 65  Weight: 161 lb 3.2 oz (73.1 kg)  Height: 5\' 5"  (1.651 m)   Body mass index is 26.83 kg/m.  Generalized: Well developed, in no acute distress  Head: normocephalic and atraumatic,. Oropharynx benign  Neck: Supple, no carotid bruits  Cardiac: Regular rate rhythm, no murmur  Musculoskeletal: No deformity   Neurological examination   Mentation: Alert oriented to time, place, history taking. Attention span and concentration appropriate. Recent and remote memory intact.  Follows all commands speech and language fluent.   Cranial nerve II-XII: Pupils were equal round reactive to light extraocular movements were full, visual field were full on confrontational test. Facial sensation and strength were normal. hearing was intact to finger rubbing bilaterally. Uvula tongue midline. head turning and shoulder shrug were normal and symmetric.Tongue protrusion into cheek strength was normal. Motor: Mild right hemiparesis. Right shoulder elevation limited due to pain. 4. Right-sided weakness and weakness of right grip, intrinsic hand muscles.  Tone is slightly increased on the right as compared to the left Sensory: normal and symmetric to light touch, pinprick, and  Vibration, on the left, decreased vibratory on the right other modalities are normal Coordination: finger-nose-finger, heel-to-shin bilaterally, no dysmetria Reflexes: 1+ upper lower and symmetric, , plantar responses were flexor bilaterally. Gait and Station: Rising up from seated position without assistance, normal stance, gait demonstrates hemiplegic gait with dragging of the right foot and mild spasticity   DIAGNOSTIC DATA (LABS, IMAGING, TESTING) - I reviewed patient records, labs, notes, testing and imaging myself where available.  Lab Results  Component Value Date   WBC 6.2 11/27/2016   HGB 16.0 (H) 01/13/2017   HCT 47.0 (H) 01/13/2017  MCV 83.4 11/27/2016   PLT 202 11/27/2016      Component Value Date/Time   NA 144 01/13/2017 1434   K 3.9 01/13/2017 1434   CL 104 01/13/2017 1434   CO2 23 11/27/2016 2204   GLUCOSE 108 (H) 01/13/2017 1434   BUN 17 01/13/2017 1434   CREATININE 1.00 01/13/2017 1434   CALCIUM 8.7 (L) 11/27/2016 2204   PROT 6.3 (L) 12/28/2015 0616   ALBUMIN 3.4 (L) 12/28/2015 0616   AST 15 12/28/2015 0616   ALT 11 (L) 12/28/2015 0616   ALKPHOS 58 12/28/2015 0616   BILITOT 0.5 12/28/2015 0616   GFRNONAA >60 11/27/2016 2204   GFRAA >60 11/27/2016 2204   Lab Results  Component Value Date   CHOL 213 (H) 02/17/2017   HDL 64 02/17/2017   LDLCALC 133 (H) 02/17/2017   TRIG 78 02/17/2017   CHOLHDL 3.3 02/17/2017   Lab Results  Component Value Date   HGBA1C 5.8 (H) 12/26/2015   No results found for: VITAMINB12 Lab Results  Component Value Date   TSH 5.130 (H) 02/17/2017      ASSESSMENT AND PLAN 51 year old African-American lady with small left basal ganglia hemorrhagic infarct in February  2017 due to 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 The patient is a current patient of Dr.  Leonie Man who is out of the office today . This note is sent to the work in MD.  PLAN: Reviewed recent labs done by primary care 02/17/2017 LDL remains high at 133 along with low vitamin D level and TSH 5.13 her meds were adjusted  Continue aspirin 325 mg daily for secondary stroke prevention  maintain strict control of hypertension with blood pressure goal below 130/90,  todays reading 119/72 lipids with LDL cholesterol goal below 70 mg/dL. Continue Zocor labs followed by PCP most recent LDL 133 Eat a healthy diet with plenty of whole grains, cereals, fruits and vegetables,  Continue Rehab services and HEP. MRI of the brain schedule on the way out for onset of new headache F/U 3 months Advised to stop cocaine use altogether Dennie Bible, [facility], [facility], [facility] [address] [place], [place] [address] [phone]

## 2017-03-24 ENCOUNTER — Telehealth: Payer: Self-pay | Admitting: Nurse Practitioner

## 2017-03-24 NOTE — Telephone Encounter (Signed)
Medication in the system for MRI which according to our scheduler will probably be done in a week and a half. I did not already ordered this at her pharmacy because of the patient's drug use. Will call in  when it is scheduled.

## 2017-03-24 NOTE — Progress Notes (Signed)
I have reviewed and agreed above plan. 

## 2017-03-26 ENCOUNTER — Telehealth: Payer: Self-pay | Admitting: *Deleted

## 2017-03-26 NOTE — Telephone Encounter (Signed)
Left message on voicemail for patient to return call/schedule apt with M. Hairston. Unable to reach on [phone] x [phone]

## 2017-03-26 NOTE — Telephone Encounter (Signed)
Left message on voicemail to return call.

## 2017-03-26 NOTE — Telephone Encounter (Addendum)
Walkin reason: Pt c/o feeling tingling, "shock" sensation in  both arms. No pain, very annoying for 4 days. Sensation in left arm radiates upward towards left shoulder and is in her right hand.  Pt states she has not been on her [place]. She picked up at the pharmacy prior to triage. Grips: Right hand- present, weak- h/o stroke            Left hand- present, strong            Gait- WNL,  BP:147/97  P:72  SpO2:96% pls advise

## 2017-03-26 NOTE — Telephone Encounter (Signed)
Is patient still here? What did you advise her to do? Considering her history of prior CVA , if she is having any stroke like symptoms she should have been referred to ED. If not then she could have been added on as a walk -in to my schedule today if an opening was available or you could notify front office to add patient to my schedule first available this week. Please follow up with patient and ask if she is available to come in this week or next. Thank you.

## 2017-03-27 ENCOUNTER — Other Ambulatory Visit: Payer: Self-pay | Admitting: Nurse Practitioner

## 2017-03-27 MED ORDER — ALPRAZOLAM 0.5 MG PO TABS: 0.5000 mg | ORAL_TABLET | Freq: Once | ORAL | 0 refills | Status: DC

## 2017-03-27 MED ORDER — ALPRAZOLAM 0.5 MG PO TABS: 0.5000 mg | ORAL_TABLET | Freq: Once | ORAL | 0 refills | Status: AC

## 2017-03-27 NOTE — Addendum Note (Signed)
Addended byOliver Hum on: 03/27/2017 03:02 PM   Modules accepted: Orders

## 2017-03-28 NOTE — Progress Notes (Signed)
Received fax confirmation xanax [facility] [phone]. sy

## 2017-04-01 ENCOUNTER — Ambulatory Visit: Payer: Medicaid Other | Admitting: Nurse Practitioner

## 2017-04-01 NOTE — Telephone Encounter (Signed)
Attempt made to call back, unable to reach, No answer Attempt to call on other contact number: pt states the symptoms dissipated. She is dealing with heat now since her [facility] went out. She was advise the heat can negatively affect her d/t PMH and advised to stay cool and hydrated. Pt verbalized understanding. Encouraged to call office for any further concerns.

## 2017-05-02 ENCOUNTER — Other Ambulatory Visit: Payer: Self-pay | Admitting: Family Medicine

## 2017-05-02 DIAGNOSIS — E039 Hypothyroidism, unspecified: Secondary | ICD-10-CM

## 2017-05-02 NOTE — Telephone Encounter (Signed)
CMA call regarding medication refill   Patient did not answer @ # [phone]  Patient tried to call [phone] patient answer was aware and understood

## 2017-05-02 NOTE — Telephone Encounter (Signed)
Patient is requesting refill for sythroid please send to [facility]

## 2017-05-05 MED FILL — ?LEVOTHYROXINE 150 MCG TAB: 150 | 30 days supply | Qty: 30 | Fill #0

## 2017-05-23 ENCOUNTER — Other Ambulatory Visit: Payer: Self-pay | Admitting: Physical Medicine & Rehabilitation

## 2017-05-28 ENCOUNTER — Encounter: Payer: Self-pay | Admitting: Rehabilitative and Restorative Service Providers"

## 2017-05-28 NOTE — Therapy (Signed)
[facility] [address] [place], [place], [address] Phone: [phone]   Fax:  [phone]  Patient Details  Name: BUELAH RENNIE MRN: [id number] Date of Birth: 1966/03/05 Referring Provider:  No ref. provider found  Encounter Date: last encounter 10/24/16   Patient did not come for further sessions after evaluation 10/24/16--see note for patient status at that time.  Thank you for the referral of this patient. Rudell Cobb, MPT    Jarmel Linhardt 05/28/2017, 8:47 AM  [facility] [address] [place], [place], [address] Phone: [phone]   Fax:  [phone]

## 2017-06-03 ENCOUNTER — Other Ambulatory Visit: Payer: Self-pay | Admitting: Family Medicine

## 2017-06-03 ENCOUNTER — Emergency Department (HOSPITAL_COMMUNITY)
Admission: EM | Admit: 2017-06-03 | Discharge: 2017-06-03 | Disposition: A | Discharge status: Home / Self Care | Payer: Medicaid Other | Attending: Emergency Medicine | Admitting: Emergency Medicine

## 2017-06-03 ENCOUNTER — Encounter (HOSPITAL_COMMUNITY): Payer: Self-pay

## 2017-06-03 ENCOUNTER — Ambulatory Visit: Payer: Medicaid Other | Attending: Family Medicine

## 2017-06-03 DIAGNOSIS — F17210 Nicotine dependence, cigarettes, uncomplicated: Secondary | ICD-10-CM | POA: Insufficient documentation

## 2017-06-03 DIAGNOSIS — E039 Hypothyroidism, unspecified: Secondary | ICD-10-CM

## 2017-06-03 DIAGNOSIS — E038 Other specified hypothyroidism: Secondary | ICD-10-CM | POA: Insufficient documentation

## 2017-06-03 DIAGNOSIS — M722 Plantar fascial fibromatosis: Secondary | ICD-10-CM | POA: Insufficient documentation

## 2017-06-03 DIAGNOSIS — M79672 Pain in left foot: Secondary | ICD-10-CM | POA: Diagnosis present

## 2017-06-03 DIAGNOSIS — D219 Benign neoplasm of connective and other soft tissue, unspecified: Secondary | ICD-10-CM

## 2017-06-03 DIAGNOSIS — Z8673 Personal history of transient ischemic attack (TIA), and cerebral infarction without residual deficits: Secondary | ICD-10-CM | POA: Diagnosis not present

## 2017-06-03 DIAGNOSIS — Z85850 Personal history of malignant neoplasm of thyroid: Secondary | ICD-10-CM | POA: Diagnosis not present

## 2017-06-03 DIAGNOSIS — Z7982 Long term (current) use of aspirin: Secondary | ICD-10-CM | POA: Diagnosis not present

## 2017-06-03 DIAGNOSIS — E559 Vitamin D deficiency, unspecified: Secondary | ICD-10-CM

## 2017-06-03 DIAGNOSIS — M79671 Pain in right foot: Secondary | ICD-10-CM

## 2017-06-03 MED ORDER — HYDROCODONE-ACETAMINOPHEN 5-325 MG PO TABS
1.0000 | ORAL_TABLET | Freq: Once | ORAL | Status: AC
  Administered 2017-06-03: 1 via ORAL
  Filled 2017-06-03: qty 1

## 2017-06-03 MED FILL — ?CETIRIZINE HCL 10 MG TABLE: 10 | 30 days supply | Qty: 30 | Fill #2

## 2017-06-03 MED FILL — SIMVASTATIN 20 MG TABLET: 20 | 30 days supply | Qty: 30 | Fill #1

## 2017-06-03 MED FILL — VIT D2 1.25 MG (50,000 UNIT: 1.25 MG | 28 days supply | Qty: 4 | Fill #2

## 2017-06-03 NOTE — Discharge Instructions (Signed)
It was my pleasure taking care of you today!   Please call the podiatry clinic to schedule a follow up appointment.   Return to ER for new or worsening symptoms, any additional concerns.

## 2017-06-03 NOTE — ED Provider Notes (Signed)
[facility] DEPT Provider Note   CSN: [id number] Arrival date & time: 06/03/17  1029   By signing my name below, I, Soijett Blue, attest that this documentation has been prepared under the direction and in the presence of Pearlie Oyster, PA-C Electronically Signed: [place], ED Scribe. 06/03/17. 12:09 PM.  History   Chief Complaint Chief Complaint  Patient presents with  . Foot Pain    HPI Theresa Watkins is a 51 y.o. female who presents to the Emergency Department complaining of left foot pain onset 6 months worsening 2 months ago. Pt has tried tylenol with no dose today with no relief of her symptoms. Pt reports that she has a growth to the plantar surface of her left foot x 6 months. She notes that her left foot pain is exacerbated with laying down and alleviated with ambulating on the side of her left foot. Pt reports that she was evaluated by a podiatrist 5 months ago and was informed that the growth was a tumor that would need to be surgically removed. She states that she has not followed up with the podiatrist as advised. She denies color change, wound, swelling, and any other symptoms.   Per pt chart review: Pt was seen at [facility] on 09/11/2016 for left foot pain. Pt discussed with Dr. Amalia Hailey surgical versus conservative management of her plantar fibroma and hallux abductovalgus deformity. Pt was advised to follow up in the office for further evaluation on 09/25/2016. Per patient today, she has not done so.     The history is provided by the patient. No language interpreter was used.    Past Medical History:  Diagnosis Date  . Abnormal Pap smear   . Bronchitis   . Fibroid   . Headache(784.0)   . Ovarian cyst   . Seizures ([place])   . Stroke ([place])   . Thyroid cancer ([place]) 20011  . Trichomonas   . Urinary tract infection     Patient Active Problem List   Diagnosis Date Noted  . Hyperlipemia 03/21/2017  . Headache 03/21/2017  . Tooth pain   . CVA  (cerebral infarction) 12/27/2015  . Hypothyroidism due to acquired atrophy of thyroid   . HLD (hyperlipidemia)   . Hemiparesis affecting dominant side as late effect of stroke ([place])   . 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 Tobacco abuse   . Cocaine abuse   . ETOH abuse   . Hypothyroidism (acquired) 12/24/2015  . Acute ischemic stroke ([place]) 12/24/2015  . Stroke ([place]) 12/24/2015  . Drug abuse, cocaine type 05/09/2014    Past Surgical History:  Procedure Laterality Date  . ABDOMINAL HYSTERECTOMY    . CESAREAN SECTION    . goiter     removed  . laparoscopic  1988   removal  of ectopic preg, ruptured tube  . THERAPEUTIC ABORTION    . THYROIDECTOMY  march 2011   cancer    OB History    Gravida Para Term Preterm AB Living   3 1 1  0 2 1   SAB TAB Ectopic Multiple Live Births   0 1 1 0         Home Medications    Prior to Admission medications   Medication Sig Start Date End Date Taking? Authorizing Provider  aspirin 325 MG tablet Take 325 mg by mouth daily.    [provider]  Calcium Carbonate-Vitamin D (CALCIUM + D PO) Take 2 tablets by mouth 2 (two)  times daily.     [provider]  capsaicin (ZOSTRIX) 0.025 % cream Apply topically 2 (two) times daily as needed. 02/17/17   Alfonse Spruce, FNP  cetirizine (ZYRTEC) 10 MG tablet Take 1 tablet (10 mg total) by mouth daily. 02/17/17   Alfonse Spruce, FNP  levothyroxine (SYNTHROID, LEVOTHROID) 150 MCG tablet TAKE 1 TABLET BY MOUTH DAILY BEFORE BREAKFAST. 05/02/17   Alfonse Spruce, FNP  omeprazole (PRILOSEC) 10 MG capsule Take 1 capsule (10 mg total) by mouth daily. 02/17/17   Alfonse Spruce, FNP  simvastatin (ZOCOR) 20 MG tablet TAKE ONE TABLET BY MOUTH ONCE DAILY AT  6PM 05/23/17   Alfonse Spruce, FNP    Family History Family History  Problem Relation Age of Onset  . Bell's palsy Mother   . Cancer Father   . Stroke Father     Social History Social History    Substance Use Topics  . Smoking status: Light Tobacco Smoker    Packs/day: 0.10    Years: 25.00    Types: Cigarettes  . Smokeless tobacco: Never Used     Comment: smoke 2 a day trying to quit  . Alcohol use 0.0 oz/week     Comment: occ     Allergies   Iohexol; Proanthocyanidin; Diphenhydramine hcl; Percocet [oxycodone-acetaminophen]; and Tramadol   Review of Systems Review of Systems  Musculoskeletal: Positive for arthralgias (left foot). Negative for joint swelling.  Skin: Negative for color change and wound.       +"growth" to plantar surface of left foot     Physical Exam Updated Vital Signs BP 115/85 (BP Location: Left Arm)   Pulse 68   Temp 98.3 F (36.8 C) (Oral)   Resp 18   SpO2 98%   Physical Exam  Constitutional: She appears well-developed and well-nourished. No distress.  HENT:  Head: Normocephalic and atraumatic.  Neck: Neck supple.  Cardiovascular: Normal rate, regular rhythm and normal heart sounds.   No murmur heard. Pulmonary/Chest: Effort normal and breath sounds normal. No respiratory distress. She has no wheezes. She has no rales.  Musculoskeletal: Normal range of motion.  Plantar surface of left foot with 4 x 4 cm firm nodule. No overlying erythema. No open wounds.   Neurological: She is alert.  Skin: Skin is warm and dry.  Nursing note and vitals reviewed.    ED Treatments / Results  DIAGNOSTIC STUDIES: Oxygen Saturation is 98% on RA, nl by my interpretation.    COORDINATION OF CARE: 11:35 AM Discussed treatment plan with pt at bedside and pt agreed to plan.   Labs (all labs ordered are listed, but only abnormal results are displayed) Labs Reviewed - No data to display  EKG  EKG Interpretation None       Radiology No results found.  Procedures Procedures (including critical care time)  Medications Ordered in ED Medications  HYDROcodone-acetaminophen (NORCO/VICODIN) 5-325 MG per tablet 1 tablet (1 tablet Oral Given  06/03/17 1231)     Initial Impression / Assessment and Plan / ED Course  I have reviewed the triage vital signs and the nursing notes.  Pertinent labs & imaging results that were available during my care of the patient were reviewed by me and considered in my medical decision making (see chart for details).     Theresa Watkins is a 51 y.o. female who presents to ED for left foot pain 2/2 known plantar fibroma. She has been seen by podiatry and recommended to have this  excised. Discussed importance of following up with podiatry. Clinic information was given to patient again. Return precautions discussed and all questions answered.   Final Clinical Impressions(s) / ED Diagnoses   Final diagnoses:  Foot pain, right  Fibroma    New Prescriptions Discharge Medication List as of 06/03/2017 12:11 PM     I personally performed the services described in this documentation, which was scribed in my presence. The recorded information has been reviewed and is accurate.    Toshika Parrow, Ozella Almond, PA-C 06/03/17 1520    Gareth Morgan, MD 06/04/17 [phone]

## 2017-06-03 NOTE — ED Triage Notes (Signed)
Pt reports noticing a knot developing to the bottom of her left foot for a few months and is painful to walk. Pt is ambulatory

## 2017-06-04 ENCOUNTER — Other Ambulatory Visit: Payer: Self-pay | Admitting: Family Medicine

## 2017-06-04 ENCOUNTER — Telehealth: Payer: Self-pay

## 2017-06-04 DIAGNOSIS — E039 Hypothyroidism, unspecified: Secondary | ICD-10-CM

## 2017-06-04 LAB — TSH: TSH: 0.065 u[IU]/mL — ABNORMAL LOW (ref 0.450–4.500)

## 2017-06-04 MED ORDER — LEVOTHYROXINE SODIUM 125 MCG PO TABS: 125.0000 ug | ORAL_TABLET | Freq: Every day | ORAL | 0 refills | Status: DC

## 2017-06-04 NOTE — Telephone Encounter (Signed)
CMA call patient regarding lab results  Patient verify DOB  Patient was aware and understood    

## 2017-06-04 NOTE — Telephone Encounter (Signed)
-----   Message from Alfonse Spruce, [place] sent at 06/04/2017  1:02 PM EDT ----- Thyroid test shows thyroid is over functioning. Will lower dosage of levothyroxine.  Recommend follow up in 2 months to check levels.

## 2017-06-05 LAB — VITAMIN D 25 HYDROXY (VIT D DEFICIENCY, FRACTURES): Vit D, 25-Hydroxy: 20.8 ng/mL — ABNORMAL LOW (ref 30.0–100.0)

## 2017-06-05 MED FILL — ?LEVOTHYROXINE 125 MCG TABL: 125 | 30 days supply | Qty: 30 | Fill #0

## 2017-06-06 ENCOUNTER — Telehealth: Payer: Self-pay | Admitting: Family Medicine

## 2017-06-06 NOTE — Telephone Encounter (Signed)
Theresa Theresa Watkins Case Management  from [facility]  Calling regarding Theresa Watkins she is concern she was seen at the ED and not with her PCP and Medicaid wants her to follow up . Please, if you have any questions give her a call at  [phone] Thank You .

## 2017-06-09 NOTE — Telephone Encounter (Signed)
Theresa Earlie Server Case Management  from [facility]  Calling regarding Theresa Watkins she is concern she was seen at the ED and not with her PCP and Medicaid wants her to follow up . Please, if you have any questions give her a call at  [phone] Thank You .

## 2017-06-09 NOTE — Telephone Encounter (Signed)
Please call to follow up with Theresa Watkins from [facility].What was her concern? Patient was last seen in office on 02/17/2017 and was encouraged to come in for follow up in 8 weeks. Recent history of ED visit was for complaint unrelated to previous office visit.

## 2017-06-10 ENCOUNTER — Other Ambulatory Visit: Payer: Self-pay | Admitting: Family Medicine

## 2017-06-10 ENCOUNTER — Telehealth: Payer: Self-pay

## 2017-06-10 DIAGNOSIS — E559 Vitamin D deficiency, unspecified: Secondary | ICD-10-CM

## 2017-06-10 MED ORDER — VITAMIN D (ERGOCALCIFEROL) 1.25 MG (50000 UNIT) PO CAPS: 50000.0000 [IU] | ORAL_CAPSULE | ORAL | 0 refills | Status: AC

## 2017-06-10 NOTE — Telephone Encounter (Signed)
Spoke with dorothy from case management & she just wanted suggestions on how to approach the patient because when she contact her  Shanece hang up  on her and she is a very non complaint so she just wanted some assistance.

## 2017-06-10 NOTE — Telephone Encounter (Signed)
-----   Message from Alfonse Spruce, [place] sent at 06/10/2017  1:53 PM EDT ----- Vitamin D level was low. Vitamin D helps to keep bones strong. You were prescribed ergocalciferol (capsules) to increase your vitamin-d level.  Recommend follow up in 3 months.

## 2017-06-10 NOTE — Telephone Encounter (Signed)
CMA call regarding lab results   Patient verify DOB  Patient was aware and understood  

## 2017-06-19 DIAGNOSIS — Z0289 Encounter for other administrative examinations: Secondary | ICD-10-CM

## 2017-06-20 ENCOUNTER — Encounter: Payer: Medicaid Other | Admitting: Family Medicine

## 2017-06-26 ENCOUNTER — Other Ambulatory Visit: Payer: Self-pay | Admitting: Family Medicine

## 2017-06-26 DIAGNOSIS — E559 Vitamin D deficiency, unspecified: Secondary | ICD-10-CM

## 2017-06-27 MED FILL — VIT D2 1.25 MG (50,000 UNIT: 1.25 MG | 30 days supply | Qty: 4 | Fill #0

## 2017-07-09 NOTE — Progress Notes (Signed)
[facility]  PATIENT: CLARIS PECH DOB: 25-Jan-1966   REASON FOR VISIT:Follow-up for lacunar infarction February 2017, new complaint of headache has not scheduled MRI  HISTORY FROM: Patient     HISTORY OF PRESENT ILLNESS: UPDATE 08/23/2018CM Ms Pha, 51 year old female returns for follow-up with history of stroke  in February 2017. She remains on aspirin for secondary stroke prevention without further stroke or TIA symptoms. She has no bruising and no bleeding. She continues to complain of headache on the left side. She did not have an MRI in the hospital because she refused. MRI was ordered at her last visit however she has not scheduled this. She continues to use cocaine occasionally. She continues to smoke. She remains on Zocor for hyperlipidemia without myalgias. She continues to have weakness on her right side. Therapies have concluded. Blood pressure today 135/91. She returns for reevaluation   UPDATE 5/4/ 2018. Ms  Chisenhall, 51 year old female returns for follow-up with a history of left caudate  infarct in February 2017. She is currently on aspirin for secondary stroke prevention with no bruising and no bleeding. She has not had further stroke or TIA symptoms. She does complain with headaches on the left side which are severe at times. She has photophobia and phonophobia. No nausea or vomiting. Patient did not have an MRI when she had her stroke event because she refused. She is asking for MRI now  due to onset of headaches. She remains on Zocor for hyperlipidemia. She denies any myalgias. She continues to have weakness on her right side and she continues to go to therapy. She is continuing to smoke a few cigarettes a day and admits to using cocaine at least monthly. She returns for reevaluation    11/13/2017CM Ms. Ostermann, 51 year old female returns for follow-up. She has a history of  small hemorrhagic left basal ganglia infarct which occurred in February 2017.  She is currently on aspirin for secondary stroke prevention without recurrent stroke or TIA symptoms. She has no bruising and no bleeding .She continues to complain of visual problems she has not  seen an ophthalmologist as suggested. She continues to have some weakness on the right side she claims she has just gotten her Medicaid. Physical therapy has been ordered for her previously. She is continuing to smoke 2 cigarettes a day and denies alcohol or cocaine drug use. She remains on Zocor. She returns for reevaluation  UPDATE 07/13/2017CM  Ms.Stannard, 51 year old female returns for follow-up last seen in the office by Dr. Leonie Man 02/28/2016 for hospital follow-up for stroke. She remains on aspirin and Zocor for secondary stroke prevention without recurrent stroke or TIA symptoms. She does complain of blurred vision today and she has not seen an ophthalmologist in over 30 years. She also complains of right shoulder pain, seen in the emergency room on 04/30/2016. Right shoulder x-ray with mild degenerative joint disease. She had therapies ordered when last seen however she just got her Medicaid  Yesterday so she will follow-up for some strengthening exercises. She continues to smoke and was encouraged to quit she also has a history of cocaine use. She denies any recent cocaine use. She complains with headaches today  posterior headaches which she describes as moderate in severity pulling sensation and appears to be more pronounced with neck movements.and says the only thing that helps his Vicodin. She was made aware we do not prescribe narcotic medications for headaches. She does not want to be on a preventive. She also complains of  vaginal bleeding and was made aware she needs to see OB/GYN. Her speech has improved. She returns for reevaluation   HISTORY 02/28/16 PS49 year African-American lady seen today for the first office follow-up visit for hospital admission for stroke in February 2017. MITCHELL EPLING  is an 51 y.o. female with a history of hypertension, thyroid cancer and cocaine use, as well as reported history of stroke 7 years ago, presenting with new onset right facial and upper extremity weakness, as well as slurred speech. Patient also has had several falls over the past couple of days. She also admits to use of cocaine last night. Urine drug screen was positive for cocaine. There is an equivocal history of transient right lower extremity weakness. Information obtained from the patient is somewhat unreliable. CT scan of her head was unremarkable with no acute intracranial abnormality. NIH stroke score was 8. Her last known well was unclear. Patient was not administered TPA secondary to unknown last known well. She was admitted for further evaluation and treatment. Initial CT scan of the head on admission showed no definite infarct. Patient did not tolerate having an MRI done. Repeat CT scan of the head showed a small hemorrhagic left basal ganglia infarct. Transthoracic echo showed normal ejection fraction with a to left septal aneurysm with possibly a small patent foramen ovale. Carotid ultrasound showed no significant extracranial stenosis. Urine drug screen was positive for cocaine. Hemoglobin A1c was 5.6. LDL cholesterol was 119 and total cholesterol 181. Patient was counseled to quit smoking cigarettes and cocaine. She was started on Zocor for hyperlipidemia and aspirin. She went to inpatient rehabilitation and subsequently has been at home is received inpatient therapy but outpatient therapy has not yet been started. She states her speech has improved though at times she still has trouble speaking completing sentences. She also has significant right hand weakness with diminished fine motor skills. Her right leg also tracks that time. She also has some posterior headaches which he describes as moderate in severity pulling sensation and appears to be more pronounced with neck movements. She is  tolerating aspirin without bleeding or bruising and Zocor without muscle aches or pains. She quit smoking for a month but has recently restarted but smokes only a quarter pack per day. She states she is stop doing cocaine.   REVIEW OF SYSTEMS: Full 14 system review of systems performed and notable only for those listed, all others are neg:  Constitutional: neg  Cardiovascular: neg Ear/Nose/Throat: neg  Skin: neg Eyes: neg Respiratory: neg Gastroitestinal: Urinary frequency  Hematology/Lymphatic: neg  Endocrine: Excessive thirst Musculoskeletal: Walking difficulty, joint pain Allergy/Immunology: neg Neurological: Headache Psychiatric: neg Sleep : neg   ALLERGIES: Allergies  Allergen Reactions  . Iohexol Hives, Itching and Other (See Comments)     Code: HIVES, Desc: pts tongue began itching post injection and throat burning, Onset Date: [id number]   . Proanthocyanidin Swelling    Swelling of the tongue  . Diphenhydramine Hcl Rash  . Percocet [Oxycodone-Acetaminophen] Rash  . Tramadol Rash    HOME MEDICATIONS: Outpatient Medications Prior to Visit  Medication Sig Dispense Refill  . aspirin 325 MG tablet Take 325 mg by mouth daily.    . Calcium Carbonate-Vitamin D (CALCIUM + D PO) Take 2 tablets by mouth 2 (two) times daily.     . capsaicin (ZOSTRIX) 0.025 % cream Apply topically 2 (two) times daily as needed. 45 g 0  . cetirizine (ZYRTEC) 10 MG tablet Take 1 tablet (10 mg total) by  mouth daily. 30 tablet 6  . levothyroxine (SYNTHROID, LEVOTHROID) 125 MCG tablet Take 1 tablet (125 mcg total) by mouth daily. 60 tablet 0  . simvastatin (ZOCOR) 20 MG tablet TAKE ONE TABLET BY MOUTH ONCE DAILY AT  6PM 30 tablet 2  . Vitamin D, Ergocalciferol, (DRISDOL) 50000 units CAPS capsule Take 1 capsule (50,000 Units total) by mouth every 7 (seven) days. 12 capsule 0  . omeprazole (PRILOSEC) 10 MG capsule Take 1 capsule (10 mg total) by mouth daily. (Patient not taking: Reported on 07/10/2017)  30 capsule 2   No facility-administered medications prior to visit.     PAST MEDICAL HISTORY: Past Medical History:  Diagnosis Date  . Abnormal Pap smear   . Bronchitis   . Fibroid   . Headache(784.0)   . Ovarian cyst   . Seizures ([place])   . Stroke ([place])   . Thyroid cancer ([place]) 20011  . Trichomonas   . Urinary tract infection     PAST SURGICAL HISTORY: Past Surgical History:  Procedure Laterality Date  . ABDOMINAL HYSTERECTOMY    . CESAREAN SECTION    . goiter     removed  . laparoscopic  1988   removal  of ectopic preg, ruptured tube  . THERAPEUTIC ABORTION    . THYROIDECTOMY  march 2011   cancer    FAMILY HISTORY: Family History  Problem Relation Age of Onset  . Bell's palsy Mother   . Cancer Father   . Stroke Father     SOCIAL HISTORY: Social History   Social History  . Marital status: Divorced    Spouse name: N/A  . Number of children: N/A  . Years of education: N/A   Occupational History  . Not on file.   Social History Main Topics  . Smoking status: Light Tobacco Smoker    Packs/day: 0.10    Years: 25.00    Types: Cigarettes  . Smokeless tobacco: Never Used     Comment: smoke 2 a day trying to quit  . Alcohol use 0.0 oz/week     Comment: occ  . Drug use: Yes    Types: Cocaine     Comment: 1-2x monthly  . Sexual activity: Not Currently    Birth control/ protection: Surgical   Other Topics Concern  . Not on file   Social History Narrative  . No narrative on file     PHYSICAL EXAM  Vitals:   07/10/17 1446  BP: (!) 135/91  Pulse: 76  Weight: 163 lb 6.4 oz (74.1 kg)  Height: 5\' 5"  (1.651 m)   Body mass index is 27.19 kg/m.  Generalized: Well developed, in no acute distress  Head: normocephalic and atraumatic,. Oropharynx benign  Neck: Supple, no carotid bruits  Cardiac: Regular rate rhythm, no murmur  Musculoskeletal: No deformity   Neurological examination   Mentation: Alert oriented to time, place, history taking.  Attention span and concentration appropriate. Recent and remote memory intact.  Follows all commands speech and language fluent.   Cranial nerve II-XII: Pupils were equal round reactive to light extraocular movements were full, visual field were full on confrontational test. Facial sensation and strength were normal. hearing was intact to finger rubbing bilaterally. Uvula tongue midline. head turning and shoulder shrug were normal and symmetric.Tongue protrusion into cheek strength was normal. Motor: Mild right hemiparesis. Right shoulder elevation limited due to pain. 4. Right-sided weakness and weakness of right grip, intrinsic hand muscles. Tone is slightly increased on the right as compared  to the left Sensory: normal and symmetric to light touch, pinprick, and  Vibration, on the left, decreased vibratory on the right other modalities are normal Coordination: finger-nose-finger, heel-to-shin bilaterally, no dysmetria Reflexes: 1+ upper lower and symmetric, , plantar responses were flexor bilaterally. Gait and Station: Rising up from seated position without assistance, normal stance, gait demonstrates hemiplegic gait with dragging of the right foot and mild spasticity   DIAGNOSTIC DATA (LABS, IMAGING, TESTING) - I reviewed patient records, labs, notes, testing and imaging myself where available.  Lab Results  Component Value Date   WBC 6.2 11/27/2016   HGB 16.0 (H) 01/13/2017   HCT 47.0 (H) 01/13/2017   MCV 83.4 11/27/2016   PLT 202 11/27/2016      Component Value Date/Time   NA 144 01/13/2017 1434   K 3.9 01/13/2017 1434   CL 104 01/13/2017 1434   CO2 23 11/27/2016 2204   GLUCOSE 108 (H) 01/13/2017 1434   BUN 17 01/13/2017 1434   CREATININE 1.00 01/13/2017 1434   CALCIUM 8.7 (L) 11/27/2016 2204   PROT 6.3 (L) 12/28/2015 0616   ALBUMIN 3.4 (L) 12/28/2015 0616   AST 15 12/28/2015 0616   ALT 11 (L) 12/28/2015 0616   ALKPHOS 58 12/28/2015 0616   BILITOT 0.5 12/28/2015 0616    GFRNONAA >60 11/27/2016 2204   GFRAA >60 11/27/2016 2204   Lab Results  Component Value Date   CHOL 213 (H) 02/17/2017   HDL 64 02/17/2017   LDLCALC 133 (H) 02/17/2017   TRIG 78 02/17/2017   CHOLHDL 3.3 02/17/2017   Lab Results  Component Value Date   HGBA1C 5.8 (H) 12/26/2015   No results found for: VITAMINB12 Lab Results  Component Value Date   TSH 0.065 (L) 06/03/2017      ASSESSMENT AND PLAN 51 year old African-American lady with small left basal ganglia hemorrhagic infarct in February  2017 due to 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 The patient is a current patient of Dr. Leonie Man who is out of the office today . This note is sent to the work in MD. Patient continues to use a cocaine intermittently.   PLAN:   Continue aspirin 325 mg daily for secondary stroke prevention  maintain strict control of hypertension with blood pressure goal below 130/90,  todays reading 135/91 lipids with LDL cholesterol goal below 70 mg/dL. Continue Zocor labs followed by PCP  Eat a healthy diet with plenty of whole grains, cereals, fruits and vegetables,  Continue  HEP. MRI of the brain  for onset of new headache F/U 6 months Advised to stop cocaine use altogether Stop smoking  Dennie Bible, [facility], [facility], [facility] [address] [place],  [address] [phone]

## 2017-07-10 ENCOUNTER — Ambulatory Visit (INDEPENDENT_AMBULATORY_CARE_PROVIDER_SITE_OTHER): Payer: Medicaid Other | Admitting: Nurse Practitioner

## 2017-07-10 ENCOUNTER — Encounter: Payer: Self-pay | Admitting: Nurse Practitioner

## 2017-07-10 VITALS — BP 135/91 | HR 76 | Ht 65.0 in | Wt 163.4 lb

## 2017-07-10 DIAGNOSIS — E785 Hyperlipidemia, unspecified: Secondary | ICD-10-CM | POA: Diagnosis not present

## 2017-07-10 DIAGNOSIS — I639 Cerebral infarction, unspecified: Secondary | ICD-10-CM

## 2017-07-10 DIAGNOSIS — F1410 Cocaine abuse, uncomplicated: Secondary | ICD-10-CM

## 2017-07-10 DIAGNOSIS — R51 Headache: Secondary | ICD-10-CM

## 2017-07-10 DIAGNOSIS — I69359 Hemiplegia and hemiparesis following cerebral infarction affecting unspecified side: Secondary | ICD-10-CM

## 2017-07-10 DIAGNOSIS — R519 Headache, unspecified: Secondary | ICD-10-CM

## 2017-07-10 DIAGNOSIS — Z720 Tobacco use: Secondary | ICD-10-CM

## 2017-07-10 NOTE — Patient Instructions (Signed)
Continue aspirin 325 mg daily for secondary stroke prevention  maintain strict control of hypertension with blood pressure goal below 130/90,  todays reading 135/91 lipids with LDL cholesterol goal below 70 mg/dL. Continue Zocor labs followed by PCP  Eat a healthy diet with plenty of whole grains, cereals, fruits and vegetables,  Continue  HEP. MRI of the brain  for onset of new headache F/U 6 months Advised to stop cocaine use altogether Stop smoking

## 2017-07-17 ENCOUNTER — Encounter: Payer: Self-pay | Admitting: Family Medicine

## 2017-07-17 ENCOUNTER — Ambulatory Visit: Payer: Medicaid Other | Attending: Family Medicine | Admitting: Licensed Clinical Social Worker

## 2017-07-17 ENCOUNTER — Other Ambulatory Visit (HOSPITAL_COMMUNITY)
Admission: RE | Admit: 2017-07-17 | Discharge: 2017-07-17 | Disposition: A | Discharge status: Home / Self Care | Payer: Medicaid Other | Source: Ambulatory Visit | Attending: Family Medicine | Admitting: Family Medicine

## 2017-07-17 ENCOUNTER — Ambulatory Visit: Payer: Medicaid Other | Attending: Family Medicine | Admitting: Family Medicine

## 2017-07-17 VITALS — BP 166/102 | HR 79 | Temp 98.2°F | Resp 17 | Ht 65.0 in | Wt 163.6 lb

## 2017-07-17 DIAGNOSIS — Z113 Encounter for screening for infections with a predominantly sexual mode of transmission: Secondary | ICD-10-CM | POA: Insufficient documentation

## 2017-07-17 DIAGNOSIS — M2550 Pain in unspecified joint: Secondary | ICD-10-CM | POA: Diagnosis not present

## 2017-07-17 DIAGNOSIS — Z79899 Other long term (current) drug therapy: Secondary | ICD-10-CM | POA: Insufficient documentation

## 2017-07-17 DIAGNOSIS — Z823 Family history of stroke: Secondary | ICD-10-CM | POA: Insufficient documentation

## 2017-07-17 DIAGNOSIS — Z888 Allergy status to other drugs, medicaments and biological substances status: Secondary | ICD-10-CM | POA: Diagnosis not present

## 2017-07-17 DIAGNOSIS — Z885 Allergy status to narcotic agent status: Secondary | ICD-10-CM | POA: Diagnosis not present

## 2017-07-17 DIAGNOSIS — R10817 Generalized abdominal tenderness: Secondary | ICD-10-CM | POA: Diagnosis not present

## 2017-07-17 DIAGNOSIS — Z9889 Other specified postprocedural states: Secondary | ICD-10-CM | POA: Diagnosis not present

## 2017-07-17 DIAGNOSIS — F17210 Nicotine dependence, cigarettes, uncomplicated: Secondary | ICD-10-CM | POA: Insufficient documentation

## 2017-07-17 DIAGNOSIS — Z8673 Personal history of transient ischemic attack (TIA), and cerebral infarction without residual deficits: Secondary | ICD-10-CM | POA: Diagnosis not present

## 2017-07-17 DIAGNOSIS — I10 Essential (primary) hypertension: Secondary | ICD-10-CM

## 2017-07-17 DIAGNOSIS — Z0000 Encounter for general adult medical examination without abnormal findings: Secondary | ICD-10-CM

## 2017-07-17 DIAGNOSIS — F329 Major depressive disorder, single episode, unspecified: Secondary | ICD-10-CM

## 2017-07-17 DIAGNOSIS — F419 Anxiety disorder, unspecified: Principal | ICD-10-CM

## 2017-07-17 DIAGNOSIS — Z90710 Acquired absence of both cervix and uterus: Secondary | ICD-10-CM | POA: Insufficient documentation

## 2017-07-17 DIAGNOSIS — Z7982 Long term (current) use of aspirin: Secondary | ICD-10-CM | POA: Insufficient documentation

## 2017-07-17 DIAGNOSIS — F32A Depression, unspecified: Secondary | ICD-10-CM

## 2017-07-17 LAB — POCT URINALYSIS DIPSTICK
Bilirubin, UA: NEGATIVE
Blood, UA: NEGATIVE
Glucose, UA: NEGATIVE
KETONES UA: NEGATIVE
Nitrite, UA: NEGATIVE
PH UA: 5.5 (ref 5.0–8.0)
PROTEIN UA: NEGATIVE
SPEC GRAV UA: 1.015 (ref 1.010–1.025)
Urobilinogen, UA: 0.2 E.U./dL

## 2017-07-17 LAB — POCT UA - MICROALBUMIN
Albumin/Creatinine Ratio, Urine, POC: <30
Creatinine, POC: 300 mg/dL
Microalbumin Ur, POC: 10 mg/L

## 2017-07-17 LAB — POCT GLYCOSYLATED HEMOGLOBIN (HGB A1C): Hemoglobin A1C: 5.6

## 2017-07-17 MED ORDER — AMLODIPINE BESYLATE 5 MG PO TABS: 5.0000 mg | ORAL_TABLET | Freq: Every day | ORAL | 2 refills | Status: DC

## 2017-07-17 MED ORDER — DICLOFENAC SODIUM 1 % TD GEL: 2.0000 g | Freq: Four times a day (QID) | TRANSDERMAL | 0 refills | Status: DC | PRN

## 2017-07-17 MED FILL — AMLODIPINE BESYLATE 5 MG TA: 5 | 30 days supply | Qty: 30 | Fill #0 | Status: TO

## 2017-07-17 NOTE — BH Specialist Note (Signed)
Integrated Behavioral Health Initial Visit  MRN: [id number] Name: Theresa Watkins   Session Start time: 3:30 PM Session End time: 3:55 PM Total time: 25 minutes  Type of Service: [place] Interpretor:No. Interpretor Name and Language: N/A   Warm Hand Off Completed.       SUBJECTIVE: Theresa Watkins is a 51 y.o. female accompanied by patient. Patient was referred by FNP [place] for depression and anxiety. Patient reports the following symptoms/concerns: overwhelming feelings of sadness and worry, difficulty sleeping, low energy, difficulty concentrating, and loneliness  Duration of problem: "years"; Severity of problem: moderate  OBJECTIVE: Mood: Anxious and Affect: Appropriate Risk of harm to self or others: No plan to harm self or others   LIFE CONTEXT: Family and Social: Pt receives support from mother and siblings who reside nearby.  School/Work: Pt has applied for disability (pending) and receives food stamps ($170) She has stable housing and denies food insecurity Self-Care: Pt has cats. She denies substance use; however, chart review indicates polysubstance use (alcohol, cocaine, and tobacco) Life Changes: Pt has hx of strokes and reports increased weakness on her right side. She is withdrawn  GOALS ADDRESSED: Patient will reduce symptoms of: anxiety and depression and increase knowledge and/or ability of: coping skills and also: Increase adequate support systems for patient/family   INTERVENTIONS: Solution-Focused Strategies, Supportive Counseling, Psychoeducation and/or Health Education and Link to [employer]  Standardized Assessments completed: GAD-7 and PHQ 2&9  ASSESSMENT: Patient currently experiencing depression and anxiety triggered by ongoing medical concerns and financial strain. She reports overwhelming feelings of sadness and worry, difficulty sleeping, low energy, difficulty concentrating, and  loneliness. Denied SI/HI/AVH. Patient receives strong support from mother. Patient may benefit from psychoeducation, psychotherapy, and medication managment. LCSWA educated pt on the correlation between one's physical and mental health. LCSWA discussed benefits of applying healthy coping skills to decrease symptoms. She successfully identified healthy coping skills to utilize on a routine basis. Pt was provided resources to assist with transportation (i.e. Medicaid and [place]). She reports right side weakness; however, declined PCS referral. Pt is not receptive to psychotherapy or medication management.   PLAN: 1. Follow up with behavioral health clinician on : Pt was encouraged to contact LCSWA if symptoms worsen or fail to improve to schedule behavioral appointments at [facility]. 2. Behavioral recommendations: LCSWA recommends that pt apply healthy coping skills discussed and utilize provided resources. Pt is encouraged to schedule follow up appointment with LCSWA 3. Referral(s): [employer] Resources:  Transportation 4. "From scale of 1-10, how likely are you to follow plan?": 8/10  Rebekah [place], LCSW 07/17/17 5:26 PM

## 2017-07-17 NOTE — Patient Instructions (Signed)
Joint Pain Joint pain can be caused by many things. The joint can be bruised, infected, weak from aging, or sore from exercise. The pain will probably go away if you follow your doctor's instructions for home care. If your joint pain continues, more tests may be needed to help find the cause of your condition. Follow these instructions at home: Watch your condition for any changes. Follow these instructions as told to lessen the pain that you are feeling:  Take medicines only as told by your doctor.  Rest the sore joint for as long as told by your doctor. If your doctor tells you to, raise (elevate) the painful joint above the level of your heart while you are sitting or lying down.  Do not do things that cause pain or make the pain worse.  If told, put ice on the painful area: ? Put ice in a plastic bag. ? Place a towel between your skin and the bag. ? Leave the ice on for 20 minutes, 2-3 times per day.  Wear an elastic bandage, splint, or sling as told by your doctor. Loosen the bandage or splint if your fingers or toes lose feeling (become numb) and tingle, or if they turn cold and blue.  Begin exercising or stretching the joint as told by your doctor. Ask your doctor what types of exercise are safe for you.  Keep all follow-up visits as told by your doctor. This is important.  Contact a doctor if:  Your pain gets worse and medicine does not help it.  Your joint pain does not get better in 3 days.  You have more bruising or swelling.  You have a fever.  You lose 10 pounds (4.5 kg) or more without trying. Get help right away if:  You are not able to move the joint.  Your fingers or toes become numb or they turn cold and blue. This information is not intended to replace advice given to you by your health care provider. Make sure you discuss any questions you have with your health care provider. Document Released: 10/23/2009 Document Revised: 04/11/2016 Document Reviewed:  08/16/2014 Elsevier Interactive Patient Education  2018 Elsevier Inc.  

## 2017-07-17 NOTE — Progress Notes (Signed)
Subjective:   Patient ID: Theresa Watkins, female    DOB: [birth year], 51 y.o.   MRN: [id number]  Chief Complaint  Patient presents with  . Annual Exam   HPI BARABARA Watkins 51 y.o. female presents for comprehensive physical exam. She has PMH significant for history of CVA with residual, thyroid cancer, hypothyroidism,  HTN, HLD. She reports family history of HTN-mother. Denies any family history of DM or cancers. History of CVA 1 year ago.. She denies any chest pain, shortness of breath, swelling of the bilateral lower extremities, or dizziness. She reports history of residual with CVA. Symptoms include right-sided weakness, chronic right arm, visual changes, and slurring of speech. Denies any history of injury, paresthesias, or swelling. Reports right sided weakness and decreased arm strength since CVA. Denies taking anything for symptoms.  Reports history of 3 physical therapy sessions in the past. She does not check BP at home. Cardiac symptoms none. Patient denies chest pain, chest pressure/discomfort, claudication, dyspnea, lower extremity edema, near-syncope, palpitations and syncope.  Cardiovascular risk factors: dyslipidemia, hypertension, sedentary lifestyle and cocaine use. Use of agents associated with hypertension: none and cocaine use. History of target organ damage: stroke. She would like STD screening with annual physical. She reports sexual encounter 3 months ago with an ex when the condom broke. Denies symptoms of all other pertinent systems.     Past Medical History:  Diagnosis Date  . Abnormal Pap smear   . Bronchitis   . Fibroid   . Headache(784.0)   . Ovarian cyst   . Seizures ([place])   . Stroke ([place])   . Thyroid cancer ([place]) 20011  . Trichomonas   . Urinary tract infection     Past Surgical History:  Procedure Laterality Date  . ABDOMINAL HYSTERECTOMY    . CESAREAN SECTION    . goiter     removed  . laparoscopic  1988   removal  of ectopic preg, ruptured tube   . THERAPEUTIC ABORTION    . THYROIDECTOMY  march 2011   cancer    Family History  Problem Relation Age of Onset  . Bell's palsy Mother   . Cancer Father   . Stroke Father     Social History   Social History  . Marital status: Divorced    Spouse name: N/A  . Number of children: N/A  . Years of education: N/A   Occupational History  . Not on file.   Social History Main Topics  . Smoking status: Light Tobacco Smoker    Packs/day: 0.10    Years: 25.00    Types: Cigarettes  . Smokeless tobacco: Never Used     Comment: smoke 2 a day trying to quit  . Alcohol use 0.0 oz/week     Comment: occ  . Drug use: Yes    Types: Cocaine     Comment: 1-2x monthly  . Sexual activity: Not Currently    Birth control/ protection: Surgical   Other Topics Concern  . Not on file   Social History Narrative  . No narrative on file    Outpatient Medications Prior to Visit  Medication Sig Dispense Refill  . levothyroxine (SYNTHROID, LEVOTHROID) 125 MCG tablet Take 1 tablet (125 mcg total) by mouth daily. 60 tablet 0  . aspirin 325 MG tablet Take 325 mg by mouth daily.    . Calcium Carbonate-Vitamin D (CALCIUM + D PO) Take 2 tablets by mouth 2 (two) times daily.     . capsaicin (ZOSTRIX)  0.025 % cream Apply topically 2 (two) times daily as needed. 45 g 0  . cetirizine (ZYRTEC) 10 MG tablet Take 1 tablet (10 mg total) by mouth daily. 30 tablet 6  . omeprazole (PRILOSEC) 10 MG capsule Take 1 capsule (10 mg total) by mouth daily. (Patient not taking: Reported on 07/10/2017) 30 capsule 2  . Vitamin D, Ergocalciferol, (DRISDOL) 50000 units CAPS capsule Take 1 capsule (50,000 Units total) by mouth every 7 (seven) days. 12 capsule 0  . simvastatin (ZOCOR) 20 MG tablet TAKE ONE TABLET BY MOUTH ONCE DAILY AT  6PM 30 tablet 2   No facility-administered medications prior to visit.     Allergies  Allergen Reactions  . Iohexol Hives, Itching and Other (See Comments)     Code: HIVES, Desc: pts  tongue began itching post injection and throat burning, Onset Date: [id number]   . Proanthocyanidin Swelling    Swelling of the tongue  . Diphenhydramine Hcl Rash  . Percocet [Oxycodone-Acetaminophen] Rash  . Tramadol Rash    Review of Systems  Constitutional: Negative.   HENT: Negative.   Eyes:       Decreased visual acuity (baseline since CVA)  Respiratory: Negative.   Cardiovascular: Negative.   Genitourinary: Negative.   Musculoskeletal: Positive for joint pain.  Skin: Negative.   Neurological:       Right sided weakness  Psychiatric/Behavioral: Negative.       Objective:    Physical Exam  Constitutional: She is oriented to person, place, and time. She appears well-developed and well-nourished.  HENT:  Head: Normocephalic and atraumatic.  Right Ear: External ear normal.  Left Ear: External ear normal.  Nose: Nose normal.  Mouth/Throat: Oropharynx is clear and moist.  Eyes: Pupils are equal, round, and reactive to light. Conjunctivae and EOM are normal.  Neck: Normal range of motion. Neck supple.  Cardiovascular: Normal rate, regular rhythm, normal heart sounds and intact distal pulses.   Pulmonary/Chest: Effort normal and breath sounds normal.  Abdominal: Soft. Bowel sounds are normal. There is tenderness.  Musculoskeletal: Normal range of motion.       Right shoulder: She exhibits pain.       Right hand: Decreased strength (4/5 motor strength) noted.  4/5 motor strength of RUE/RLE.  Neurological: She is alert and oriented to person, place, and time. She has normal reflexes.  Skin: Skin is warm and dry.  Psychiatric: She has a normal mood and affect.  Nursing note and vitals reviewed.   BP (!) 166/102   Pulse 79   Temp 98.2 F (36.8 C) (Oral)   Resp 17   Ht 5\' 5"  (1.651 m)   Wt 163 lb 9.6 oz (74.2 kg)   SpO2 97%   BMI 27.22 kg/m  Wt Readings from Last 3 Encounters:  07/17/17 163 lb 9.6 oz (74.2 kg)  07/10/17 163 lb 6.4 oz (74.1 kg)  03/21/17 161 lb 3.2  oz (73.1 kg)     There is no immunization history on file for this patient.    Lab Results  Component Value Date   TSH 0.058 (L) 07/17/2017   Lab Results  Component Value Date   WBC 5.5 07/17/2017   HGB 15.2 07/17/2017   HCT 46.3 07/17/2017   MCV 85 07/17/2017   PLT 195 07/17/2017   Lab Results  Component Value Date   NA 142 07/17/2017   K 4.2 07/17/2017   CO2 24 07/17/2017   GLUCOSE 85 07/17/2017   BUN 9 07/17/2017  CREATININE 0.75 07/17/2017   BILITOT <0.2 07/17/2017   ALKPHOS 74 07/17/2017   AST 16 07/17/2017   ALT 12 07/17/2017   PROT 6.7 07/17/2017   ALBUMIN 4.4 07/17/2017   CALCIUM 9.0 07/17/2017   ANIONGAP 9 11/27/2016   Lab Results  Component Value Date   CHOL 180 07/17/2017   CHOL 213 (H) 02/17/2017   CHOL 228 (H) 12/26/2015   Lab Results  Component Value Date   HDL 49 07/17/2017   HDL 64 02/17/2017   HDL 62 12/26/2015   Lab Results  Component Value Date   LDLCALC 104 (H) 07/17/2017   LDLCALC 133 (H) 02/17/2017   LDLCALC 135 (H) 12/26/2015   Lab Results  Component Value Date   TRIG 134 07/17/2017   TRIG 78 02/17/2017   TRIG 157 (H) 12/26/2015   Lab Results  Component Value Date   CHOLHDL 3.7 07/17/2017   CHOLHDL 3.3 02/17/2017   CHOLHDL 3.7 12/26/2015   Lab Results  Component Value Date   HGBA1C 5.6 07/17/2017   HGBA1C 5.8 (H) 12/26/2015   HGBA1C  06/14/2010    5.6 (NOTE)                                                                       According to the ADA Clinical Practice Recommendations for 2011, when HbA1c is used as a screening test:   >=6.5%   Diagnostic of Diabetes Mellitus           (if abnormal result  is confirmed)  5.7-6.4%   Increased risk of developing Diabetes Mellitus  References:Diagnosis and Classification of Diabetes Mellitus,Diabetes FFMB,8466,59(DJTTS 1):S62-S69 and Standards of Medical Care in         Diabetes - 2011,Diabetes Care,2011,34  (Suppl 1):S11-S61.       Assessment & Plan:   Problem List  Items Addressed This Visit    None    Visit Diagnoses    Annual physical exam    -  Primary   Relevant Orders   CMP and Liver (Completed)   TSH (Completed)   Lipid Panel (Completed)   HEP, RPR, HIV Panel (Completed)   CBC with Differential (Completed)   POCT glycosylated hemoglobin (Hb A1C) (Completed)   Visual acuity screen.   Screening for STDs (sexually transmitted diseases)       Relevant Orders   Urine cytology ancillary only (Completed)   Generalized abdominal tenderness without rebound tenderness       Relevant Orders   CMP and Liver (Completed)   Lipid Panel (Completed)   Urinalysis Dipstick (Completed)   POCT UA - Microalbumin (Completed)   CBC with Differential (Completed)   Essential hypertension       Relevant Medications   amLODipine (NORVASC) 5 MG tablet   Other Relevant Orders   CMP and Liver (Completed)   Lipid Panel (Completed)   POCT UA - Microalbumin (Completed)   Arthralgia, unspecified joint       Relevant Medications   diclofenac sodium (VOLTAREN) 1 % GEL     Meds ordered this encounter  Medications  . diclofenac sodium (VOLTAREN) 1 % GEL    Sig: Apply 2 g topically 4 (four) times daily as needed.    Dispense:  100 g  Refill:  0    Order Specific Question:   Supervising Provider    Answer:   Tresa Garter [id number]  . amLODipine (NORVASC) 5 MG tablet    Sig: Take 1 tablet (5 mg total) by mouth daily.    Dispense:  30 tablet    Refill:  2    Order Specific Question:   Supervising Provider    Answer:   Tresa Garter [id number]    Follow up: Return in about 2 weeks (around 07/31/2017) for BP check / PAP.   Fredia Beets, FNP

## 2017-07-17 NOTE — Progress Notes (Signed)
Patient is here for physical  Patient has vision problems

## 2017-07-18 LAB — CMP AND LIVER
ALT: 12 IU/L (ref 0–32)
AST: 16 IU/L (ref 0–40)
Albumin: 4.4 g/dL (ref 3.5–5.5)
Alkaline Phosphatase: 74 IU/L (ref 39–117)
BUN: 9 mg/dL (ref 6–24)
Bilirubin Total: <0.2 mg/dL (ref 0.0–1.2)
Bilirubin, Direct: 0.06 mg/dL (ref 0.00–0.40)
CO2: 24 mmol/L (ref 20–29)
Calcium: 9 mg/dL (ref 8.7–10.2)
Chloride: 103 mmol/L (ref 96–106)
Creatinine, Ser: 0.75 mg/dL (ref 0.57–1.00)
GFR calc Af Amer: 106 mL/min/1.73
GFR calc non Af Amer: 92 mL/min/1.73
Glucose: 85 mg/dL (ref 65–99)
Potassium: 4.2 mmol/L (ref 3.5–5.2)
Sodium: 142 mmol/L (ref 134–144)
Total Protein: 6.7 g/dL (ref 6.0–8.5)

## 2017-07-18 LAB — HEP, RPR, HIV PANEL
HIV Screen 4th Generation wRfx: NONREACTIVE
Hepatitis B Surface Ag: NEGATIVE
RPR Ser Ql: NONREACTIVE

## 2017-07-18 LAB — LIPID PANEL
Chol/HDL Ratio: 3.7 ratio (ref 0.0–4.4)
Cholesterol, Total: 180 mg/dL (ref 100–199)
HDL: 49 mg/dL
LDL Calculated: 104 mg/dL — ABNORMAL HIGH (ref 0–99)
Triglycerides: 134 mg/dL (ref 0–149)
VLDL Cholesterol Cal: 27 mg/dL (ref 5–40)

## 2017-07-18 LAB — CBC WITH DIFFERENTIAL/PLATELET
BASOS: 0 %
Basophils Absolute: 0 10*3/uL (ref 0.0–0.2)
EOS (ABSOLUTE): 0 10*3/uL (ref 0.0–0.4)
EOS: 1 %
HEMATOCRIT: 46.3 % (ref 34.0–46.6)
Hemoglobin: 15.2 g/dL (ref 11.1–15.9)
IMMATURE GRANULOCYTES: 0 %
Immature Grans (Abs): 0 10*3/uL (ref 0.0–0.1)
Lymphocytes Absolute: 1.3 10*3/uL (ref 0.7–3.1)
Lymphs: 24 %
MCH: 27.9 pg (ref 26.6–33.0)
MCHC: 32.8 g/dL (ref 31.5–35.7)
MCV: 85 fL (ref 79–97)
MONOS ABS: 0.4 10*3/uL (ref 0.1–0.9)
Monocytes: 7 %
NEUTROS ABS: 3.8 10*3/uL (ref 1.4–7.0)
Neutrophils: 68 %
Platelets: 195 10*3/uL (ref 150–379)
RBC: 5.44 x10E6/uL — ABNORMAL HIGH (ref 3.77–5.28)
RDW: 14.3 % (ref 12.3–15.4)
WBC: 5.5 10*3/uL (ref 3.4–10.8)

## 2017-07-18 LAB — URINE CYTOLOGY ANCILLARY ONLY
CHLAMYDIA, DNA PROBE: NEGATIVE
Neisseria Gonorrhea: NEGATIVE
TRICH (WINDOWPATH): POSITIVE — AB

## 2017-07-18 LAB — TSH: TSH: 0.058 u[IU]/mL — ABNORMAL LOW (ref 0.450–4.500)

## 2017-07-22 ENCOUNTER — Other Ambulatory Visit: Payer: Self-pay | Admitting: Family Medicine

## 2017-07-22 ENCOUNTER — Telehealth: Payer: Self-pay

## 2017-07-22 DIAGNOSIS — E039 Hypothyroidism, unspecified: Secondary | ICD-10-CM

## 2017-07-22 DIAGNOSIS — E782 Mixed hyperlipidemia: Secondary | ICD-10-CM

## 2017-07-22 MED ORDER — METRONIDAZOLE 500 MG PO TABS: 2000.0000 mg | ORAL_TABLET | Freq: Once | ORAL | 0 refills | Status: AC

## 2017-07-22 MED ORDER — LEVOTHYROXINE SODIUM 112 MCG PO TABS: 112.0000 ug | ORAL_TABLET | Freq: Every day | ORAL | 0 refills | Status: DC

## 2017-07-22 MED ORDER — ATORVASTATIN CALCIUM 10 MG PO TABS: 10.0000 mg | ORAL_TABLET | Freq: Every day | ORAL | 3 refills | Status: DC

## 2017-07-22 MED FILL — ?ATORVASTATIN 10 MG TABLET: 10 | 30 days supply | Qty: 30 | Fill #0

## 2017-07-22 MED FILL — metroNIDAZOLE 500 MG TABS: 500 | 1 days supply | Qty: 4 | Fill #0

## 2017-07-22 MED FILL — ?LEVOTHYROXINE 112 MCG TAB: 112 | 30 days supply | Qty: 30 | Fill #0

## 2017-07-22 NOTE — Telephone Encounter (Signed)
CMA call regarding lab results   Patient verify DOB  Patient was aware and understood  

## 2017-07-22 NOTE — Telephone Encounter (Signed)
-----   Message from Alfonse Spruce, [place] sent at 07/22/2017  8:10 AM EDT ----- Trichomonas is positive. Trichomonas is a STD. You will be prescribed metronidazole to treat. Any sexual partners will need to be treated as well.  HIV, Hepatitis B, and syphilis are all negative.  Gonorrhea, Chlamydia  Negative. Labs that evaluated your blood cells, fluid and electrolyte balance are normal. No signs of anemia present. Lipid levels were elevated. This can increase your risk of heart disease overtime. Stop taking simvastatin. You will be prescribed atorvastatin.  Start eating a diet low in saturated fat. Limit your intake of fried foods, red meats, and whole milk. Increase activity. Dosage of levothyroxine will be decreased based on TSH levels. Recommend follow up in 3 months.

## 2017-07-23 LAB — URINE CYTOLOGY ANCILLARY ONLY: Candida vaginitis: NEGATIVE

## 2017-07-24 ENCOUNTER — Other Ambulatory Visit: Payer: Self-pay | Admitting: Family Medicine

## 2017-07-24 ENCOUNTER — Telehealth: Payer: Self-pay

## 2017-07-24 DIAGNOSIS — B9689 Other specified bacterial agents as the cause of diseases classified elsewhere: Secondary | ICD-10-CM

## 2017-07-24 DIAGNOSIS — N760 Acute vaginitis: Principal | ICD-10-CM

## 2017-07-24 MED ORDER — METRONIDAZOLE 500 MG PO TABS: 500.0000 mg | ORAL_TABLET | Freq: Two times a day (BID) | ORAL | 0 refills | Status: DC

## 2017-07-24 MED FILL — metroNIDAZOLE 500 MG TABS: 500 | 7 days supply | Qty: 14 | Fill #0

## 2017-07-24 NOTE — Telephone Encounter (Signed)
-----   Message from Alfonse Spruce, [place] sent at 07/24/2017  9:41 AM EDT ----- Bacterial vaginosis was positive. BV is caused by an overgrowth of germs in the vagina. You will be prescribed metronidazole to treat. To reduce your risk of developing BV don't douche, don't use scented soap or sprays, and use protection during sexual intercourse. Yeast negative.

## 2017-07-24 NOTE — Telephone Encounter (Signed)
CMA call regarding lab results   Patient verify DOB  Patient was aware and understood  

## 2017-08-06 ENCOUNTER — Telehealth: Payer: Self-pay | Admitting: Nurse Practitioner

## 2017-08-06 ENCOUNTER — Telehealth: Payer: Self-pay

## 2017-08-06 NOTE — Telephone Encounter (Signed)
Error

## 2017-08-06 NOTE — Telephone Encounter (Signed)
Patient can be given Xanax RX prior to MRI. Will call to pharmacy if she agrees to have scan

## 2017-08-06 NOTE — Telephone Encounter (Signed)
F[place]ola with [facility] reached out to me and informed me she called the patient to schedule her MRI but the patient stated "did not schedule her MRI she stated that she was very claustrophobic and would need to be sedated. I explained to the patient that we did not do any sedation in our office only at the hospital and that she had the option to get a prescription for medication from your office to help her relax, she stated that she does not know what she wants to do and would think about it. " That is from [place]. Please advise.

## 2017-08-07 MED ORDER — ALPRAZOLAM 0.5 MG PO TABS: 0.5000 mg | ORAL_TABLET | Freq: Once | ORAL | 0 refills | Status: AC

## 2017-08-07 NOTE — Telephone Encounter (Signed)
rx to pharamacy

## 2017-08-07 NOTE — Telephone Encounter (Signed)
Called patient and advised her that NP can prescribe xanax for her to take prior to the MRI. However if she doesn't want to do that, NP will cancel the MRI. Patient stated she wanted to do the MRI, agreed to have Xanax prescribed to [facility] , [place].  Advised her she must have a driver to and from the MRI; she verbalized understanding, agreement.  Called [facility], spoke with [place] and informed her the patient will have MRI done, will take Xanax prior to scan. She stated she would forward this message to MRI scheduler and have them call the patient to schedule.  She verbalized appreciation for call.

## 2017-08-07 NOTE — Telephone Encounter (Signed)
Xanax Rx faxed to [facility], [place].

## 2017-08-21 ENCOUNTER — Ambulatory Visit
Admission: RE | Admit: 2017-08-21 | Discharge: 2017-08-21 | Disposition: A | Discharge status: Home / Self Care | Payer: Medicaid Other | Source: Ambulatory Visit | Attending: Nurse Practitioner | Admitting: Nurse Practitioner

## 2017-08-21 DIAGNOSIS — I639 Cerebral infarction, unspecified: Secondary | ICD-10-CM | POA: Diagnosis not present

## 2017-08-21 DIAGNOSIS — R51 Headache: Secondary | ICD-10-CM | POA: Diagnosis not present

## 2017-08-21 DIAGNOSIS — R519 Headache, unspecified: Secondary | ICD-10-CM

## 2017-08-25 ENCOUNTER — Telehealth: Payer: Self-pay | Admitting: *Deleted

## 2017-08-25 NOTE — Telephone Encounter (Signed)
-----   Message from Dennie Bible, NP sent at 08/25/2017  1:40 PM EDT ----- MRI of the brain without acute findings, shows old strokes. Please call patient

## 2017-08-25 NOTE — Telephone Encounter (Signed)
She can make an appointment with her physician to go over her MRI results.

## 2017-08-25 NOTE — Telephone Encounter (Signed)
I spoke to pt and relayed the results of her MRI brain.  She was asking how many stroke she has.  I relayed that I was not able to tell her how many totally.  You want to call her or make earlier appt to go over this?  She is to have one in  6 months which I said could go over then but she wanted to talk about this now.

## 2017-08-27 NOTE — Telephone Encounter (Signed)
I called patient`s listed home number and got no reply and unable to leave message. As per my review of MRI head 1 definite old stroke on the left side in the deep portion of the brain which was new compared with previous CT scan of the head from 2011

## 2017-09-01 NOTE — Telephone Encounter (Signed)
I spoke to pt and relayed that per Dr. Clydene Fake note that per his review of MRI that she had one definite old stroke on the left side in the deep portion of the brain which was new compared with previous CT scan of the head done 2011.   Pt verbalized understanding.

## 2017-09-08 ENCOUNTER — Emergency Department (HOSPITAL_COMMUNITY)
Admission: EM | Admit: 2017-09-08 | Discharge: 2017-09-08 | Disposition: A | Discharge status: Home / Self Care | Payer: Medicaid Other | Attending: Physician Assistant | Admitting: Physician Assistant

## 2017-09-08 ENCOUNTER — Emergency Department (HOSPITAL_COMMUNITY): Payer: Medicaid Other

## 2017-09-08 ENCOUNTER — Encounter (HOSPITAL_COMMUNITY): Payer: Self-pay | Admitting: *Deleted

## 2017-09-08 DIAGNOSIS — Z8673 Personal history of transient ischemic attack (TIA), and cerebral infarction without residual deficits: Secondary | ICD-10-CM | POA: Diagnosis not present

## 2017-09-08 DIAGNOSIS — J189 Pneumonia, unspecified organism: Secondary | ICD-10-CM | POA: Diagnosis not present

## 2017-09-08 DIAGNOSIS — R0789 Other chest pain: Secondary | ICD-10-CM

## 2017-09-08 DIAGNOSIS — Z79899 Other long term (current) drug therapy: Secondary | ICD-10-CM | POA: Insufficient documentation

## 2017-09-08 DIAGNOSIS — E039 Hypothyroidism, unspecified: Secondary | ICD-10-CM | POA: Diagnosis not present

## 2017-09-08 DIAGNOSIS — R079 Chest pain, unspecified: Secondary | ICD-10-CM | POA: Diagnosis present

## 2017-09-08 DIAGNOSIS — F17210 Nicotine dependence, cigarettes, uncomplicated: Secondary | ICD-10-CM | POA: Diagnosis not present

## 2017-09-08 LAB — I-STAT TROPONIN, ED: TROPONIN I, POC: 0 ng/mL (ref 0.00–0.08)

## 2017-09-08 LAB — BASIC METABOLIC PANEL
ANION GAP: 10 (ref 5–15)
BUN: 6 mg/dL (ref 6–20)
CALCIUM: 8.8 mg/dL — AB (ref 8.9–10.3)
CO2: 24 mmol/L (ref 22–32)
Chloride: 104 mmol/L (ref 101–111)
Creatinine, Ser: 0.85 mg/dL (ref 0.44–1.00)
GLUCOSE: 151 mg/dL — AB (ref 65–99)
POTASSIUM: 3.1 mmol/L — AB (ref 3.5–5.1)
SODIUM: 138 mmol/L (ref 135–145)

## 2017-09-08 LAB — CBC
HEMATOCRIT: 47.6 % — AB (ref 36.0–46.0)
HEMOGLOBIN: 16.1 g/dL — AB (ref 12.0–15.0)
MCH: 28.5 pg (ref 26.0–34.0)
MCHC: 33.8 g/dL (ref 30.0–36.0)
MCV: 84.4 fL (ref 78.0–100.0)
Platelets: 199 10*3/uL (ref 150–400)
RBC: 5.64 MIL/uL — AB (ref 3.87–5.11)
RDW: 14.2 % (ref 11.5–15.5)
WBC: 8.2 10*3/uL (ref 4.0–10.5)

## 2017-09-08 LAB — D-DIMER, QUANTITATIVE: D-Dimer, Quant: 0.36 ug/mL-FEU (ref 0.00–0.50)

## 2017-09-08 MED ORDER — SODIUM CHLORIDE 0.9 % IV BOLUS (SEPSIS)
1000.0000 mL | Freq: Once | INTRAVENOUS | Status: AC
  Administered 2017-09-08: 1000 mL via INTRAVENOUS

## 2017-09-08 MED ORDER — ACETAMINOPHEN 325 MG PO TABS
650.0000 mg | ORAL_TABLET | Freq: Once | ORAL | Status: AC
  Administered 2017-09-08: 650 mg via ORAL
  Filled 2017-09-08: qty 2

## 2017-09-08 MED ORDER — AZITHROMYCIN 250 MG PO TABS: 250.0000 mg | ORAL_TABLET | Freq: Every day | ORAL | 0 refills | Status: DC

## 2017-09-08 NOTE — ED Provider Notes (Signed)
[facility] EMERGENCY DEPARTMENT Provider Note   CSN: [id number] Arrival date & time: 09/08/17  1206     History   Chief Complaint Chief Complaint  Patient presents with  . Chest Pain    HPI Theresa Watkins is a 51 y.o. female.  HPI   51 year old female with a history of CVA, thyroid cancer, hypothyroidism, tension, hyperlipidemia presents today with complaints of chest pain.  She reports that over the last 4 days she has had left anterior and lateral chest pain.  She describes this as sharp in nature, worse with inspiration, worse with movement or palpation.  Patient notes she feels short of breath, notes this feels similar to previous episodes of pneumonia.  Patient denies any trauma to the ribs or chest, reports the shortness of breath started today, worse with ambulation.  She denies any lower extremity swelling or edema, she denies any history DVT or PE, or any significant risk factors.   Past Medical History:  Diagnosis Date  . Abnormal Pap smear   . Bronchitis   . Fibroid   . Headache(784.0)   . Ovarian cyst   . Seizures ([place])   . Stroke ([place])   . Thyroid cancer ([place]) 20011  . Trichomonas   . Urinary tract infection     Patient Active Problem List   Diagnosis Date Noted  . Hyperlipemia 03/21/2017  . Headache 03/21/2017  . Tooth pain   . CVA (cerebral infarction) 12/27/2015  . Hypothyroidism due to acquired atrophy of thyroid   . HLD (hyperlipidemia)   . Hemiparesis affecting dominant side as late effect of stroke ([place])   . 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 Tobacco abuse   . Cocaine abuse ([place])   . ETOH abuse   . Hypothyroidism (acquired) 12/24/2015  . Acute ischemic stroke ([place]) 12/24/2015  . Stroke ([place]) 12/24/2015  . Drug abuse, cocaine type ([place]) 05/09/2014    Past Surgical History:  Procedure Laterality Date  . ABDOMINAL HYSTERECTOMY    . CESAREAN SECTION    . goiter     removed  . laparoscopic  1988   removal  of ectopic preg, ruptured tube  . THERAPEUTIC ABORTION    . THYROIDECTOMY  march 2011   cancer    OB History    Gravida Para Term Preterm AB Living   3 1 1  0 2 1   SAB TAB Ectopic Multiple Live Births   0 1 1 0         Home Medications    Prior to Admission medications   Medication Sig Start Date End Date Taking? Authorizing Provider  amLODipine (NORVASC) 5 MG tablet Take 1 tablet (5 mg total) by mouth daily. 07/17/17   Alfonse Spruce, FNP  aspirin 325 MG tablet Take 325 mg by mouth daily.    [provider]  atorvastatin (LIPITOR) 10 MG tablet Take 1 tablet (10 mg total) by mouth daily. 07/22/17   Alfonse Spruce, FNP  azithromycin (ZITHROMAX) 250 MG tablet Take 1 tablet (250 mg total) by mouth daily. Take first 2 tablets together, then 1 every day until finished. 09/08/17   Lycia Sachdeva, Dellis Filbert, PA-C  Calcium Carbonate-Vitamin D (CALCIUM + D PO) Take 2 tablets by mouth 2 (two) times daily.     [provider]  capsaicin (ZOSTRIX) 0.025 % cream Apply topically 2 (two) times daily as needed. 02/17/17   Alfonse Spruce, FNP  cetirizine (ZYRTEC) 10 MG tablet Take 1  tablet (10 mg total) by mouth daily. 02/17/17   Alfonse Spruce, FNP  diclofenac sodium (VOLTAREN) 1 % GEL Apply 2 g topically 4 (four) times daily as needed. 07/17/17   Alfonse Spruce, FNP  levothyroxine (SYNTHROID, LEVOTHROID) 112 MCG tablet Take 1 tablet (112 mcg total) by mouth daily. 07/22/17   Alfonse Spruce, FNP  metroNIDAZOLE (FLAGYL) 500 MG tablet Take 1 tablet (500 mg total) by mouth 2 (two) times daily. 07/24/17   Alfonse Spruce, FNP  omeprazole (PRILOSEC) 10 MG capsule Take 1 capsule (10 mg total) by mouth daily. Patient not taking: Reported on 07/10/2017 02/17/17   Alfonse Spruce, FNP    Family History Family History  Problem Relation Age of Onset  . Bell's palsy Mother   . Cancer Father   . Stroke Father     Social History Social History  Substance  Use Topics  . Smoking status: Light Tobacco Smoker    Packs/day: 0.10    Years: 25.00    Types: Cigarettes  . Smokeless tobacco: Never Used     Comment: smoke 2 a day trying to quit  . Alcohol use 0.0 oz/week     Comment: occ     Allergies   Iohexol; Proanthocyanidin; Diphenhydramine hcl; and Tramadol   Review of Systems Review of Systems  All other systems reviewed and are negative.   Physical Exam Updated Vital Signs BP 127/90   Pulse 83   Temp 98.8 F (37.1 C) (Oral)   Resp (!) 22   SpO2 98%   Physical Exam  Constitutional: She is oriented to person, place, and time. She appears well-developed and well-nourished.  HENT:  Head: Normocephalic and atraumatic.  Eyes: Pupils are equal, round, and reactive to light. Conjunctivae are normal. Right eye exhibits no discharge. Left eye exhibits no discharge. No scleral icterus.  Neck: Normal range of motion. No JVD present. No tracheal deviation present.  Cardiovascular: Normal rate, regular rhythm, normal heart sounds and intact distal pulses.  Exam reveals no gallop and no friction rub.   No murmur heard. Pulmonary/Chest: Effort normal and breath sounds normal. No stridor. No respiratory distress. She has no wheezes. She has no rales. She exhibits tenderness.  TTP left lateral chest wall no swelling edema, masses   Neurological: She is alert and oriented to person, place, and time. Coordination normal.  Psychiatric: She has a normal mood and affect. Her behavior is normal. Judgment and thought content normal.  Nursing note and vitals reviewed.   ED Treatments / Results  Labs (all labs ordered are listed, but only abnormal results are displayed) Labs Reviewed  BASIC METABOLIC PANEL - Abnormal; Notable for the following:       Result Value   Potassium 3.1 (*)    Glucose, Bld 151 (*)    Calcium 8.8 (*)    All other components within normal limits  CBC - Abnormal; Notable for the following:    RBC 5.64 (*)     Hemoglobin 16.1 (*)    HCT 47.6 (*)    All other components within normal limits  D-DIMER, QUANTITATIVE (NOT AT [facility])  I-STAT TROPONIN, ED    EKG  EKG Interpretation None       Radiology Dg Chest 2 View  Result Date: 09/08/2017 CLINICAL DATA:  51 year old female with midline chest pain, shortness of breath and productive cough EXAM: CHEST  2 VIEW COMPARISON:  Prior chest x-ray 11/27/2016 FINDINGS: Stable cardiac and mediastinal contours. Patchy airspace opacities  are present within the lung bases bilaterally. Inspiratory volumes are low. Mild background bronchitic changes are similar compared to prior. No pulmonary edema, pleural effusion or pneumothorax. No acute osseous abnormality. IMPRESSION: Low inspiratory volumes with bibasilar patchy airspace opacities which are favored to reflect atelectasis. A superimposed infiltrate/ pneumonia would be difficult to exclude radiographically. Electronically Signed   By: Jacqulynn Cadet M.D.   On: 09/08/2017 13:42    Procedures Procedures (including critical care time)  Medications Ordered in ED Medications  acetaminophen (TYLENOL) tablet 650 mg (not administered)  sodium chloride 0.9 % bolus 1,000 mL (1,000 mLs Intravenous New Bag/Given 09/08/17 1411)     Initial Impression / Assessment and Plan / ED Course  I have reviewed the triage vital signs and the nursing notes.  Pertinent labs & imaging results that were available during my care of the patient were reviewed by me and considered in my medical decision making (see chart for details).     Final Clinical Impressions(s) / ED Diagnoses   Final diagnoses:  Chest wall pain  Community acquired pneumonia, unspecified laterality   Labs: I-STAT troponin, BMP, CBC  Imaging: DG Chest 2 View  Consults:  Therapeutics:   Discharge Meds:   Assessment/Plan: Female presents today with complaints of chest wall pain.  Patient reports this feels like previous pneumonia, x-ray  findings question pneumonia.  She is afebrile nontoxic in no acute distress.  Patient will be treated for community-acquired pneumonia, also have suspicion for chest wall pain.  I have very low suspicion for ACS in this patient, she has reassuring workup here.  D-dimer is negative low suspicion for DVT.  Patient will follow-up as an outpatient with primary care.  Strict return precautions given.  She verbalized understanding and agreement to today's plan had no further questions or concerns.   New Prescriptions New Prescriptions   AZITHROMYCIN (ZITHROMAX) 250 MG TABLET    Take 1 tablet (250 mg total) by mouth daily. Take first 2 tablets together, then 1 every day until finished.     Okey Regal, PA-C 09/08/17 1448    Macarthur Critchley, MD 09/08/17 [phone]

## 2017-09-08 NOTE — ED Notes (Signed)
Pt became very anxious while in lobby, states she can't breath. Was very tearful. Brought pt back to triage, spo2 97% on room air. Instructed pt to calm down and take deep breaths.

## 2017-09-08 NOTE — ED Triage Notes (Signed)
Pt reports left side chest pain x 4 days. Pain increases with movement. Had fever yesterday and having sob. ekg done and no acute distress is noted at triage.

## 2017-09-08 NOTE — ED Notes (Signed)
ED Provider at bedside. Pt will now take tylenol

## 2017-09-08 NOTE — ED Notes (Signed)
ED Provider at bedside. 

## 2017-09-08 NOTE — ED Notes (Signed)
Patient transported to X-ray 

## 2017-09-08 NOTE — ED Notes (Signed)
Pt verbalized understanding of discharge instructions and denies any further questions at this time.   

## 2017-09-08 NOTE — Discharge Instructions (Signed)
Please read attached information. If you experience any new or worsening signs or symptoms please return to the emergency room for evaluation. Please follow-up with your primary care provider or specialist as discussed. Please use medication prescribed only as directed and discontinue taking if you have any concerning signs or symptoms.   °

## 2017-09-08 NOTE — ED Notes (Signed)
Pt states she cannot have tylenol. She is unsure of the reaction.

## 2017-09-24 ENCOUNTER — Inpatient Hospital Stay: Payer: Medicaid Other | Admitting: Critical Care Medicine

## 2017-09-25 ENCOUNTER — Encounter: Payer: Self-pay | Admitting: Physician Assistant

## 2017-09-25 ENCOUNTER — Inpatient Hospital Stay: Payer: Medicaid Other

## 2017-09-25 ENCOUNTER — Ambulatory Visit: Payer: Medicaid Other | Attending: Family Medicine | Admitting: Physician Assistant

## 2017-09-25 ENCOUNTER — Other Ambulatory Visit (HOSPITAL_COMMUNITY)
Admission: RE | Admit: 2017-09-25 | Discharge: 2017-09-25 | Disposition: A | Discharge status: Home / Self Care | Payer: Medicaid Other | Source: Ambulatory Visit | Attending: Family Medicine | Admitting: Family Medicine

## 2017-09-25 VITALS — BP 125/79 | HR 69 | Temp 98.1°F | Resp 18 | Ht 65.5 in | Wt 165.0 lb

## 2017-09-25 DIAGNOSIS — Z79899 Other long term (current) drug therapy: Secondary | ICD-10-CM | POA: Insufficient documentation

## 2017-09-25 DIAGNOSIS — J188 Other pneumonia, unspecified organism: Secondary | ICD-10-CM | POA: Insufficient documentation

## 2017-09-25 DIAGNOSIS — Z85850 Personal history of malignant neoplasm of thyroid: Secondary | ICD-10-CM | POA: Insufficient documentation

## 2017-09-25 DIAGNOSIS — E876 Hypokalemia: Secondary | ICD-10-CM | POA: Diagnosis not present

## 2017-09-25 DIAGNOSIS — Z8673 Personal history of transient ischemic attack (TIA), and cerebral infarction without residual deficits: Secondary | ICD-10-CM | POA: Insufficient documentation

## 2017-09-25 DIAGNOSIS — Z8619 Personal history of other infectious and parasitic diseases: Secondary | ICD-10-CM | POA: Insufficient documentation

## 2017-09-25 DIAGNOSIS — J189 Pneumonia, unspecified organism: Secondary | ICD-10-CM | POA: Diagnosis not present

## 2017-09-25 DIAGNOSIS — R739 Hyperglycemia, unspecified: Secondary | ICD-10-CM

## 2017-09-25 DIAGNOSIS — Z888 Allergy status to other drugs, medicaments and biological substances status: Secondary | ICD-10-CM | POA: Diagnosis not present

## 2017-09-25 DIAGNOSIS — Z7982 Long term (current) use of aspirin: Secondary | ICD-10-CM | POA: Diagnosis not present

## 2017-09-25 DIAGNOSIS — Z09 Encounter for follow-up examination after completed treatment for conditions other than malignant neoplasm: Secondary | ICD-10-CM | POA: Insufficient documentation

## 2017-09-25 DIAGNOSIS — Z8742 Personal history of other diseases of the female genital tract: Secondary | ICD-10-CM | POA: Insufficient documentation

## 2017-09-25 MED FILL — ?ATORVASTATIN 10 MG TABLET: 10 | 30 days supply | Qty: 30 | Fill #1

## 2017-09-25 MED FILL — ?LEVOTHYROXINE 112 MCG TAB: 112 | 30 days supply | Qty: 30 | Fill #1 | Status: TO

## 2017-09-25 MED FILL — VIT D2 1.25 MG (50,000 UNIT: 1.25 MG | 30 days supply | Qty: 4 | Fill #1

## 2017-09-25 NOTE — Progress Notes (Signed)
Patient ID: GAELYN TUKES, female   DOB: 03/10/1966, 51 y.o.   MRN: [id number]   Tayte Childers, is a 51 y.o. female  BOF:[id number]  DPO:[id number]  DOB - [birth year]  Subjective:  Chief Complaint and HPI: Hanni Milford is a 51 y.o. female here today for a follow up visit After being seen in the ED and diagnosed with pneumonia 09/08/2017.  She was found to have hypokalemia and hyperglycemia.  EKG showed no acute ischemia.  She was prescribed azithromycin and advised follow-up here.  She is feeling better overal.  She is having some cough and is a little more tired than normal.  No f/c.  She wants to recheck her urine to make sure she doesn't still have trich.  She was treated in August for trich with metronidazole.    CXR: Low inspiratory volumes with bibasilar patchy airspace opacities which are favored to reflect atelectasis. A superimposed infiltrate/ pneumonia would be difficult to exclude radiographically.  ED/Hospital notes reviewed.    ROS:   Constitutional:  No f/c, No night sweats, No unexplained weight loss. EENT:  No vision changes, No blurry vision, No hearing changes. No mouth, throat, or ear problems.  Respiratory: minimal cough, No SOB Cardiac: No CP, no palpitations GI:  No abd pain, No N/V/D. GU: No Urinary s/sx Musculoskeletal: No joint pain Neuro: No headache, no dizziness, no motor weakness.  Skin: No rash Endocrine:  No polydipsia. No polyuria.  Psych: Denies SI/HI  No problems updated.  ALLERGIES: Allergies  Allergen Reactions  . Iohexol Hives, Itching and Other (See Comments)     Code: HIVES, Desc: pts tongue began itching post injection and throat burning, Onset Date: [id number]   . Proanthocyanidin Swelling    Swelling of the tongue  . Diphenhydramine Hcl Rash  . Tramadol Rash    PAST MEDICAL HISTORY: Past Medical History:  Diagnosis Date  . Abnormal Pap smear   . Bronchitis   . Fibroid   . Headache(784.0)   . Ovarian cyst   . Seizures  ([place])   . Stroke ([place])   . Thyroid cancer ([place]) [address]11  . Trichomonas   . Urinary tract infection     MEDICATIONS AT HOME: Prior to Admission medications   Medication Sig Start Date End Date Taking? Authorizing Provider  amLODipine (NORVASC) 5 MG tablet Take 1 tablet (5 mg total) by mouth daily. 07/17/17  Yes Fredia Beets R, FNP  aspirin 325 MG tablet Take 325 mg by mouth daily.   Yes [provider]  atorvastatin (LIPITOR) 10 MG tablet Take 1 tablet (10 mg total) by mouth daily. 07/22/17  Yes Hairston, Maylon Peppers, FNP  azithromycin (ZITHROMAX) 250 MG tablet Take 1 tablet (250 mg total) by mouth daily. Take first 2 tablets together, then 1 every day until finished. 09/08/17  Yes Hedges, Dellis Filbert, PA-C  Calcium Carbonate-Vitamin D (CALCIUM + D PO) Take 2 tablets by mouth 2 (two) times daily.    Yes [provider]  capsaicin (ZOSTRIX) 0.025 % cream Apply topically 2 (two) times daily as needed. 02/17/17  Yes Hairston, Maylon Peppers, FNP  cetirizine (ZYRTEC) 10 MG tablet Take 1 tablet (10 mg total) by mouth daily. 02/17/17  Yes Hairston, Toy Baker R, FNP  diclofenac sodium (VOLTAREN) 1 % GEL Apply 2 g topically 4 (four) times daily as needed. 07/17/17  Yes Hairston, Maylon Peppers, FNP  levothyroxine (SYNTHROID, LEVOTHROID) 112 MCG tablet Take 1 tablet (112 mcg total) by mouth daily. 07/22/17  Yes Alfonse Spruce, FNP  omeprazole (PRILOSEC) 10 MG capsule Take 1 capsule (10 mg total) by mouth daily. 02/17/17  Yes Alfonse Spruce, FNP     Objective:  EXAM:   Vitals:   09/25/17 1106  BP: 125/79  Pulse: 69  Resp: 18  Temp: 98.1 F (36.7 C)  TempSrc: Oral  SpO2: 98%  Weight: 165 lb (74.8 kg)  Height: 5' 5.5" (1.664 m)    General appearance : A&OX3. NAD. Non-toxic-appearing HEENT: Atraumatic and Normocephalic.  PERRLA. EOM intact.  TM clear B. Mouth-MMM, post pharynx WNL w/o erythema, No PND. Neck: supple, no JVD. No cervical lymphadenopathy. No thyromegaly Chest/Lungs:   Breathing-non-labored, Good air entry bilaterally, breath sounds normal without rales, rhonchi, or wheezing  CVS: S1 S2 regular, no murmurs, gallops, rubs  Extremities: Bilateral Lower Ext shows no edema, both legs are warm to touch with = pulse throughout Neurology:  CN II-XII grossly intact, Non focal.   Psych:   Scattered speech.  Interrupts.  Mood is stable/upbeat.   Skin:  No Rash  Data Review Lab Results  Component Value Date   HGBA1C 5.6 07/17/2017   HGBA1C 5.8 (H) 12/26/2015   HGBA1C  06/14/2010    5.6 (NOTE)                                                                       According to the ADA Clinical Practice Recommendations for 2011, when HbA1c is used as a screening test:   >=6.5%   Diagnostic of Diabetes Mellitus           (if abnormal result  is confirmed)  5.7-6.4%   Increased risk of developing Diabetes Mellitus  References:Diagnosis and Classification of Diabetes Mellitus,Diabetes FIEP,3295,18(ACZYS 1):S62-S69 and Standards of Medical Care in         Diabetes - 2011,Diabetes AYTK,1601,09  (Suppl 1):S11-S61.     Assessment & Plan   1. Pneumonia due to infectious organism, unspecified laterality, unspecified part of lung Finished zpack.  Improving.  Continue fluids, relative rest, respiratory care and deep breathing exercises.   2. Hyperglycemia at ED visit - Basic metabolic panel - Hemoglobin A1c  3. Hypokalemia at ED visit - Basic metabolic panel  4. History of trichomoniasis - Urine cytology ancillary only   Patient have been counseled extensively about nutrition and exercise  Return in about 6 weeks (around 11/06/2017) for [facility] for hypothryoidism/htn.  The patient was given clear instructions to go to ER or return to medical center if symptoms don't improve, worsen or new problems develop. The patient verbalized understanding. The patient was told to call to get lab results if they haven't heard anything in the next week.     Freeman Caldron,  PA-C [facility] and [facility] [place], [place]   09/25/2017, 11:28 AM

## 2017-09-26 LAB — BASIC METABOLIC PANEL
BUN / CREAT RATIO: 11 (ref 9–23)
BUN: 8 mg/dL (ref 6–24)
CO2: 24 mmol/L (ref 20–29)
Calcium: 8 mg/dL — ABNORMAL LOW (ref 8.7–10.2)
Chloride: 106 mmol/L (ref 96–106)
Creatinine, Ser: 0.76 mg/dL (ref 0.57–1.00)
GFR, EST AFRICAN AMERICAN: 104 mL/min/{1.73_m2} (ref >59)
GFR, EST NON AFRICAN AMERICAN: 90 mL/min/{1.73_m2} (ref >59)
GLUCOSE: 79 mg/dL (ref 65–99)
Potassium: 4 mmol/L (ref 3.5–5.2)
Sodium: 143 mmol/L (ref 134–144)

## 2017-09-26 LAB — HEMOGLOBIN A1C
Est. average glucose Bld gHb Est-mCnc: 123 mg/dL
HEMOGLOBIN A1C: 5.9 % — AB (ref 4.8–5.6)

## 2017-09-26 LAB — URINE CYTOLOGY ANCILLARY ONLY
Chlamydia: NEGATIVE
NEISSERIA GONORRHEA: NEGATIVE
TRICH (WINDOWPATH): NEGATIVE

## 2017-09-29 ENCOUNTER — Telehealth: Payer: Self-pay

## 2017-09-29 LAB — URINE CYTOLOGY ANCILLARY ONLY
BACTERIAL VAGINITIS: NEGATIVE
Candida vaginitis: NEGATIVE

## 2017-09-29 NOTE — Telephone Encounter (Signed)
Patient call for lab results   Patient verify DOB   Patient was aware and understood lab results

## 2017-09-30 ENCOUNTER — Telehealth (INDEPENDENT_AMBULATORY_CARE_PROVIDER_SITE_OTHER): Payer: Self-pay | Admitting: *Deleted

## 2017-09-30 NOTE — Telephone Encounter (Signed)
-----   Message from Theresa Watkins, [place] sent at 09/29/2017  8:19 AM EST ----- Please call patient.  Her test did not show any trichomonas. Her other labs have improved since 3 weeks ago.  Her A1C is a little elevated, so she should stop eating sugar, desserts, drinking juices/sodas/tea, or eating as much white carbohydrates to reduce her risk of developing diabetes.  F/up as planned. Freeman Caldron, PA-C

## 2017-09-30 NOTE — Telephone Encounter (Signed)
MA unable to leave a message due to ringing continuously and disconnecting. Please inform patient of trichomonas being negative. All labs are improved compared to the three week check prior. Patients A1C is elevated and needs to limit carb and sugar intake to prevent developing diabetes. Follow up as planned.

## 2017-10-16 ENCOUNTER — Other Ambulatory Visit: Payer: Self-pay | Admitting: Family Medicine

## 2017-10-16 ENCOUNTER — Ambulatory Visit: Payer: Medicaid Other | Attending: Family Medicine | Admitting: Family Medicine

## 2017-10-16 ENCOUNTER — Ambulatory Visit (HOSPITAL_COMMUNITY)
Admission: RE | Admit: 2017-10-16 | Discharge: 2017-10-16 | Disposition: A | Discharge status: Home / Self Care | Payer: Medicaid Other | Source: Ambulatory Visit | Attending: Family Medicine | Admitting: Family Medicine

## 2017-10-16 ENCOUNTER — Other Ambulatory Visit (HOSPITAL_COMMUNITY)
Admission: RE | Admit: 2017-10-16 | Discharge: 2017-10-16 | Disposition: A | Discharge status: Home / Self Care | Payer: Medicaid Other | Source: Ambulatory Visit | Attending: Family Medicine | Admitting: Family Medicine

## 2017-10-16 VITALS — BP 127/82 | HR 76 | Temp 97.5°F | Wt 163.8 lb

## 2017-10-16 DIAGNOSIS — Z8701 Personal history of pneumonia (recurrent): Secondary | ICD-10-CM | POA: Diagnosis not present

## 2017-10-16 DIAGNOSIS — N6320 Unspecified lump in the left breast, unspecified quadrant: Secondary | ICD-10-CM

## 2017-10-16 DIAGNOSIS — E782 Mixed hyperlipidemia: Secondary | ICD-10-CM | POA: Diagnosis not present

## 2017-10-16 DIAGNOSIS — Z01419 Encounter for gynecological examination (general) (routine) without abnormal findings: Secondary | ICD-10-CM | POA: Diagnosis not present

## 2017-10-16 DIAGNOSIS — E039 Hypothyroidism, unspecified: Secondary | ICD-10-CM | POA: Diagnosis not present

## 2017-10-16 DIAGNOSIS — Z803 Family history of malignant neoplasm of breast: Secondary | ICD-10-CM | POA: Diagnosis not present

## 2017-10-16 DIAGNOSIS — N6325 Unspecified lump in the left breast, overlapping quadrants: Secondary | ICD-10-CM

## 2017-10-16 DIAGNOSIS — Z7982 Long term (current) use of aspirin: Secondary | ICD-10-CM | POA: Insufficient documentation

## 2017-10-16 DIAGNOSIS — Z79890 Hormone replacement therapy (postmenopausal): Secondary | ICD-10-CM | POA: Diagnosis not present

## 2017-10-16 DIAGNOSIS — N6323 Unspecified lump in the left breast, lower outer quadrant: Secondary | ICD-10-CM | POA: Insufficient documentation

## 2017-10-16 DIAGNOSIS — N6321 Unspecified lump in the left breast, upper outer quadrant: Secondary | ICD-10-CM | POA: Diagnosis not present

## 2017-10-16 DIAGNOSIS — Z113 Encounter for screening for infections with a predominantly sexual mode of transmission: Secondary | ICD-10-CM | POA: Insufficient documentation

## 2017-10-16 DIAGNOSIS — Z79899 Other long term (current) drug therapy: Secondary | ICD-10-CM | POA: Insufficient documentation

## 2017-10-16 DIAGNOSIS — I10 Essential (primary) hypertension: Secondary | ICD-10-CM | POA: Diagnosis not present

## 2017-10-16 DIAGNOSIS — Z90710 Acquired absence of both cervix and uterus: Secondary | ICD-10-CM | POA: Insufficient documentation

## 2017-10-16 MED ORDER — ATORVASTATIN CALCIUM 10 MG PO TABS: 10.0000 mg | ORAL_TABLET | Freq: Every day | ORAL | 3 refills | Status: DC

## 2017-10-16 MED ORDER — AMLODIPINE BESYLATE 5 MG PO TABS: 5.0000 mg | ORAL_TABLET | Freq: Every day | ORAL | 2 refills | Status: DC

## 2017-10-16 MED ORDER — AMOXICILLIN-POT CLAVULANATE ER 1000-62.5 MG PO TB12: 2.0000 | ORAL_TABLET | Freq: Two times a day (BID) | ORAL | 0 refills | Status: DC

## 2017-10-16 MED ORDER — LEVOTHYROXINE SODIUM 112 MCG PO TABS: 112.0000 ug | ORAL_TABLET | Freq: Every day | ORAL | 0 refills | Status: DC

## 2017-10-16 MED FILL — ATORVASTATIN 10 MG TABLET: 10 | 30 days supply | Qty: 30 | Fill #2

## 2017-10-16 MED FILL — VIT D2 1.25 MG (50,000 UNIT: 1.25 MG | 30 days supply | Qty: 4 | Fill #2

## 2017-10-16 NOTE — Patient Instructions (Addendum)
Schedule walk in lab visit for labs. Breast Self-Awareness Breast self-awareness means:  Knowing how your breasts look.  Knowing how your breasts feel.  Checking your breasts every month for changes.  Telling your doctor if you notice a change in your breasts.  Breast self-awareness allows you to notice a breast problem early while it is still small. How to do a breast self-exam One way to learn what is normal for your breasts and to check for changes is to do a breast self-exam. To do a breast self-exam: Look for Changes  1. Take off all the clothes above your waist. 2. Stand in front of a mirror in a room with good lighting. 3. Put your hands on your hips. 4. Push your hands down. 5. Look at your breasts and nipples in the mirror to see if one breast or nipple looks different than the other. Check to see if: ? The shape of one breast is different. ? The size of one breast is different. ? There are wrinkles, dips, and bumps in one breast and not the other. 6. Look at each breast for changes in your skin, such as: ? Redness. ? Scaly areas. 7. Look for changes in your nipples, such as: ? Liquid around the nipples. ? Bleeding. ? Dimpling. ? Redness. ? A change in where the nipples are. Feel for Changes 1. Lie on your back on the floor. 2. Feel each breast. To do this, follow these steps: ? Pick a breast to feel. ? Put the arm closest to that breast above your head. ? Use your other arm to feel the nipple area of your breast. Feel the area with the pads of your three middle fingers by making small circles with your fingers. For the first circle, press lightly. For the second circle, press harder. For the third circle, press even harder. ? Keep making circles with your fingers at the light, harder, and even harder pressures as you move down your breast. Stop when you feel your ribs. ? Move your fingers a little toward the center of your body. ? Start making circles with your  fingers again, this time going up until you reach your collarbone. ? Keep making up and down circles until you reach your armpit. Remember to keep using the three pressures. ? Feel the other breast in the same way. 3. Sit or stand in the shower or tub. 4. With soapy water on your skin, feel each breast the same way you did in step 2, when you were lying on the floor. Write Down What You Find  After doing the self-exam, write down:  What is normal for each breast.  Any changes you find in each breast.  When you last had your period.  How often should I check my breasts? Check your breasts every month. If you are breastfeeding, the best time to check them is after you feed your baby or after you use a breast pump. If you get periods, the best time to check your breasts is 5-7 days after your period is over. When should I see my doctor? See your doctor if you notice:  A change in shape or size of your breasts or nipples.  A change in the skin of your breast or nipples, such as red or scaly skin.  Unusual fluid coming from your nipples.  A lump or thick area that was not there before.  Pain in your breasts.  Anything that concerns you.  This information is  not intended to replace advice given to you by your health care provider. Make sure you discuss any questions you have with your health care provider. Document Released: 04/22/2008 Document Revised: 04/11/2016 Document Reviewed: 09/24/2015 Elsevier Interactive Patient Education  2018 [employer]. Pap Test Why am I having this test? A pap test is sometimes called a pap smear. It is a screening test that is used to check for signs of cancer of the vagina, cervix, and uterus. The test can also identify the presence of infection or precancerous changes. Your health care provider will likely recommend you have this test done on a regular basis. This test may be done:  Every 3 years, starting at age 21.  Every 5 years, in  combination with testing for the presence of human papillomavirus (HPV).  More or less often depending on other medical conditions.  What kind of sample is taken? Using a small cotton swab, plastic spatula, or brush, your health care provider will collect a sample of cells from the surface of your cervix. Your cervix is the opening to your uterus, also called a womb. Secretions from the cervix and vagina may also be collected. How do I prepare for this test?  Be aware of where you are in your menstrual cycle. You may be asked to reschedule the test if you are menstruating on the day of the test.  You may need to reschedule if you have a known vaginal infection on the day of the test.  You may be asked to avoid douching or taking a bath the day before or the day of the test.  Some medicines can cause abnormal test results, such as digitalis and tetracycline. Talk with your health care provider before your test if you take one of these medicines. What do the results mean? Abnormal test results may indicate a number of health conditions. These may include:  Cancer. Although pap test results cannot be used to diagnose cancer of the cervix, vagina, or uterus, they may suggest the possibility of cancer. Further tests would be required to determine if cancer is present.  Sexually transmitted disease.  Fungal infection.  Parasite infection.  Herpes infection.  A condition causing or contributing to infertility.  It is your responsibility to obtain your test results. Ask the lab or department performing the test when and how you will get your results. Contact your health care provider to discuss any questions you have about your results. Talk with your health care provider to discuss your results, treatment options, and if necessary, the need for more tests. Talk with your health care provider if you have any questions about your results. This information is not intended to replace advice  given to you by your health care provider. Make sure you discuss any questions you have with your health care provider. Document Released: 01/25/2003 Document Revised: 07/10/2016 Document Reviewed: 03/28/2014 Elsevier Interactive Patient Education  [employer].

## 2017-10-16 NOTE — Progress Notes (Signed)
Patient here for pap only.

## 2017-10-17 LAB — HEP, RPR, HIV PANEL
HEP B S AG: NEGATIVE
HIV Screen 4th Generation wRfx: NONREACTIVE
RPR: NONREACTIVE

## 2017-10-17 LAB — CYTOLOGY - PAP
Bacterial vaginitis: NEGATIVE
Candida vaginitis: NEGATIVE
Chlamydia: NEGATIVE
Diagnosis: NEGATIVE
HPV (WINDOPATH): NOT DETECTED
Neisseria Gonorrhea: NEGATIVE
TRICH (WINDOWPATH): NEGATIVE

## 2017-10-17 LAB — TSH: TSH: 2.65 u[IU]/mL (ref 0.450–4.500)

## 2017-10-20 ENCOUNTER — Other Ambulatory Visit: Payer: Self-pay | Admitting: Family Medicine

## 2017-10-20 ENCOUNTER — Telehealth: Payer: Self-pay | Admitting: Family Medicine

## 2017-10-20 DIAGNOSIS — E039 Hypothyroidism, unspecified: Secondary | ICD-10-CM

## 2017-10-20 MED ORDER — LEVOTHYROXINE SODIUM 112 MCG PO TABS: 112.0000 ug | ORAL_TABLET | Freq: Every day | ORAL | 0 refills | Status: DC

## 2017-10-20 MED FILL — LEVOTHYROXINE 112 MCG TAB: 112 | 90 days supply | Qty: 90 | Fill #0

## 2017-10-20 NOTE — Telephone Encounter (Signed)
Pt. Called requesting her pap results. Please f/u °

## 2017-10-20 NOTE — Progress Notes (Signed)
Subjective:  Patient ID: Theresa Watkins, female    DOB: [birth year]  Age: 51 y.o. MRN: [id number]  CC: Gynecologic Exam   HPI Theresa Watkins presents for well woman visit with pap. Patient reports receiving partial hysterectomy almost 20 years ago.  She  reports  family history of breast cancer- maternal aunt, cousin. She denies  family history of  gynecological cancers. She is current smoker.  She denies any lumps, denting or dimpling of the breast, or nipple discharge. She does  perform monthly SBE. She denies vaginal lesions, discharge, or dysuria. She is agreeable to STI testing with pap.  History of pneumonia: Diagnosed last month. She reports completing treatment course for pneumonia. She complains of symptoms of congested cough and chest tightness.  She denies any radiating chest pain, hemoptysis, or chills.  Outpatient Medications Prior to Visit  Medication Sig Dispense Refill  . aspirin 325 MG tablet Take 325 mg by mouth daily.    . capsaicin (ZOSTRIX) 0.025 % cream Apply topically 2 (two) times daily as needed. 45 g 0  . cetirizine (ZYRTEC) 10 MG tablet Take 1 tablet (10 mg total) by mouth daily. 30 tablet 6  . diclofenac sodium (VOLTAREN) 1 % GEL Apply 2 g topically 4 (four) times daily as needed. 100 g 0  . amLODipine (NORVASC) 5 MG tablet Take 1 tablet (5 mg total) by mouth daily. 30 tablet 2  . atorvastatin (LIPITOR) 10 MG tablet Take 1 tablet (10 mg total) by mouth daily. 90 tablet 3  . levothyroxine (SYNTHROID, LEVOTHROID) 112 MCG tablet Take 1 tablet (112 mcg total) by mouth daily. 90 tablet 0  . azithromycin (ZITHROMAX) 250 MG tablet Take 1 tablet (250 mg total) by mouth daily. Take first 2 tablets together, then 1 every day until finished. (Patient not taking: Reported on 10/16/2017) 6 tablet 0  . Calcium Carbonate-Vitamin D (CALCIUM + D PO) Take 2 tablets by mouth 2 (two) times daily.     Marland Kitchen omeprazole (PRILOSEC) 10 MG capsule Take 1 capsule (10 mg total) by mouth daily.  (Patient not taking: Reported on 10/16/2017) 30 capsule 2   No facility-administered medications prior to visit.     ROS Review of Systems  Constitutional: Negative.   HENT: Positive for congestion.   Respiratory: Positive for cough and chest tightness.   Cardiovascular: Negative.   Gastrointestinal: Negative.   Genitourinary: Negative.   Skin: Negative.   Psychiatric/Behavioral: Negative for suicidal ideas.    Objective:  BP 127/82 (BP Location: Left Arm, Patient Position: Sitting, Cuff Size: Normal)   Pulse 76   Temp (!) 97.5 F (36.4 C) (Oral)   Wt 163 lb 12.8 oz (74.3 kg)   BMI 26.84 kg/m   BP/Weight 10/16/2017 09/25/2017 38/18/2993  Systolic BP 716 967 893  Diastolic BP 82 79 83  Wt. (Lbs) 163.8 165 -  BMI 26.84 27.04 -     Physical Exam  Constitutional: She appears well-developed and well-nourished.  HENT:  Head: Normocephalic and atraumatic.  Right Ear: External ear normal.  Left Ear: External ear normal.  Nose: Nose normal.  Mouth/Throat: Oropharynx is clear and moist. No oropharyngeal exudate.  Eyes: Conjunctivae are normal. Pupils are equal, round, and reactive to light.  Neck: No JVD present.  Cardiovascular: Normal rate, regular rhythm, normal heart sounds and intact distal pulses.  Pulmonary/Chest: Effort normal. She has decreased breath sounds. Right breast exhibits no mass, no nipple discharge, no skin change and no tenderness. Left breast exhibits mass and  tenderness. Left breast exhibits no nipple discharge and no skin change.    Abdominal: Soft. Bowel sounds are normal.  Genitourinary: Cervix exhibits no discharge. No vaginal discharge found.  Skin: Skin is warm and dry.  Psychiatric: Her affect is labile. She is agitated. She expresses no homicidal and no suicidal ideation. She expresses no suicidal plans and no homicidal plans.  Nursing note and vitals reviewed.  Depression screen [facility] 2/9 10/16/2017 07/17/2017 02/17/2017  Decreased Interest - 3 3   Down, Depressed, Hopeless 2 3 3   PHQ - 2 Score 2 6 6   Altered sleeping 2 3 3   Tired, decreased energy 1 2 2   Change in appetite 2 3 3   Feeling bad or failure about yourself  2 1 3   Trouble concentrating 1 1 2   Moving slowly or fidgety/restless 3 0 2  Suicidal thoughts 2 0 0  PHQ-9 Score 15 16 21     GAD 7 : Generalized Anxiety Score 10/16/2017 07/17/2017 02/17/2017  Nervous, Anxious, on Edge 0 0 0  Control/stop worrying 3 2 1   Worry too much - different things 2 2 1   Trouble relaxing 0 2 1  Restless 0 1 0  Easily annoyed or irritable 1 1 3   Afraid - awful might happen 0 1 1  Total GAD 7 Score 6 9 7     Assessment & Plan:   1. Well woman exam with routine gynecological exam  - Cytology - PAP [place] -Cervicovaginal ancillary only  2. Breast lump on left side at 3 o'clock position  - MM DIAG BREAST TOMO BILATERAL; Future  3. Essential hypertension  - amLODipine (NORVASC) 5 MG tablet; Take 1 tablet (5 mg total) by mouth daily.  Dispense: 30 tablet; Refill: 2  4. Mixed hyperlipidemia  - atorvastatin (LIPITOR) 10 MG tablet; Take 1 tablet (10 mg total) by mouth daily.  Dispense: 90 tablet; Refill: 3  5. History of pneumonia Repeat chest x-ray to see if pneumonia has resolved. Will cover with ABT. - DG Chest 2 View; Future - amoxicillin-clavulanate (AUGMENTIN XR) 1000-62.5 MG 12 hr tablet; Take 2 tablets by mouth 2 (two) times daily.  Dispense: 28 tablet; Refill: 0  6. Acquired hypothyroidism  - TSH; Future - TSH  7. Screening for STDs (sexually transmitted diseases)  - Cytology - PAP [place] - HEP, RPR, HIV Panel; Future - HEP, RPR, HIV Panel    Follow-up: Return in about 3 months (around 01/15/2018) for HTN/hypothryoidsm.   Alfonse Spruce FNP

## 2017-10-21 ENCOUNTER — Other Ambulatory Visit: Payer: Self-pay | Admitting: Family Medicine

## 2017-10-21 DIAGNOSIS — Z8701 Personal history of pneumonia (recurrent): Secondary | ICD-10-CM

## 2017-10-21 LAB — CERVICOVAGINAL ANCILLARY ONLY: HERPES (WINDOWPATH): NEGATIVE

## 2017-10-21 MED ORDER — LEVOFLOXACIN 500 MG PO TABS: 500.0000 mg | ORAL_TABLET | Freq: Every day | ORAL | 0 refills | Status: DC

## 2017-10-21 MED FILL — levoFLOXacin 500 MG TABS: 500 | 7 days supply | Qty: 7 | Fill #0

## 2017-10-21 NOTE — Telephone Encounter (Signed)
CMA call regarding lab results   Patient did not answer but left a Vm stating the reason of the call & to call back

## 2017-10-21 NOTE — Telephone Encounter (Signed)
Pt called back returning your call, please call her back

## 2017-10-21 NOTE — Telephone Encounter (Signed)
CMA call patient regarding lab results   Patient was aware and understood

## 2017-10-21 NOTE — Telephone Encounter (Signed)
-----   Message from Alfonse Spruce, [place] sent at 10/21/2017  8:49 AM EST ----- Xray shows improved clearing of pneumonia. Normal heart size.

## 2017-10-21 NOTE — Telephone Encounter (Signed)
-----   Message from Alfonse Spruce, [place] sent at 10/20/2017 10:56 AM EST ----- PAP is normal no lesion or malignancy. HIV, Hepatitis B, and syphilis are all negative. Gonorrhea, Chlamydia,, and Trichomonas were all negative. Thyroid levels are normal on current dosage of levothyroxine.

## 2017-10-22 ENCOUNTER — Other Ambulatory Visit: Payer: Medicaid Other

## 2017-10-22 ENCOUNTER — Ambulatory Visit
Admission: RE | Admit: 2017-10-22 | Discharge: 2017-10-22 | Disposition: A | Discharge status: Home / Self Care | Payer: Medicaid Other | Source: Ambulatory Visit | Attending: Family Medicine | Admitting: Family Medicine

## 2017-10-22 DIAGNOSIS — N6325 Unspecified lump in the left breast, overlapping quadrants: Secondary | ICD-10-CM

## 2017-10-22 DIAGNOSIS — N6320 Unspecified lump in the left breast, unspecified quadrant: Principal | ICD-10-CM

## 2017-10-24 NOTE — Telephone Encounter (Signed)
CMA call regarding results   Patient did not answer & unable to leave message

## 2017-10-24 NOTE — Telephone Encounter (Signed)
-----   Message from Alfonse Spruce, [place] sent at 10/22/2017  8:48 AM EST ----- Culture for herpes is negative for active lesions containing herpes.

## 2017-10-30 ENCOUNTER — Telehealth: Payer: Self-pay | Admitting: Family Medicine

## 2017-10-30 NOTE — Telephone Encounter (Signed)
Patient called saying that. [facility] found two lumps and needs a order for ultrasound. Please fu with patient. Thank you

## 2017-10-31 NOTE — Telephone Encounter (Signed)
Please contact breast center about regarding ultrasound orders.

## 2017-11-03 NOTE — Telephone Encounter (Signed)
Pt. Called stating that she called the breast center and she was told that she needed prior auth b/c she has medicaid. Please f/u

## 2017-11-03 NOTE — Telephone Encounter (Signed)
CMA call regarding patient is requesting an [place] order due that they found a lump on her MM   CMA saw in order in patient chart so advice patient to contact Breast Center to see what's going on   Patient was aware and understood

## 2017-11-04 NOTE — Telephone Encounter (Signed)
Patient called asking how long for the prior auth with medicaid.

## 2017-11-06 ENCOUNTER — Encounter: Payer: Self-pay | Admitting: Family Medicine

## 2017-11-06 ENCOUNTER — Ambulatory Visit: Payer: Medicaid Other | Attending: Family Medicine | Admitting: Family Medicine

## 2017-11-06 VITALS — BP 93/60 | HR 77 | Temp 98.1°F | Resp 16 | Ht 66.0 in | Wt 163.8 lb

## 2017-11-06 DIAGNOSIS — M238X2 Other internal derangements of left knee: Secondary | ICD-10-CM | POA: Diagnosis not present

## 2017-11-06 DIAGNOSIS — Z9181 History of falling: Secondary | ICD-10-CM | POA: Diagnosis not present

## 2017-11-06 DIAGNOSIS — M25662 Stiffness of left knee, not elsewhere classified: Secondary | ICD-10-CM | POA: Diagnosis not present

## 2017-11-06 DIAGNOSIS — F4322 Adjustment disorder with anxiety: Secondary | ICD-10-CM | POA: Diagnosis not present

## 2017-11-06 DIAGNOSIS — Z7982 Long term (current) use of aspirin: Secondary | ICD-10-CM | POA: Diagnosis not present

## 2017-11-06 DIAGNOSIS — G4700 Insomnia, unspecified: Secondary | ICD-10-CM | POA: Insufficient documentation

## 2017-11-06 DIAGNOSIS — Z79899 Other long term (current) drug therapy: Secondary | ICD-10-CM | POA: Insufficient documentation

## 2017-11-06 DIAGNOSIS — M25562 Pain in left knee: Secondary | ICD-10-CM | POA: Diagnosis not present

## 2017-11-06 MED ORDER — NAPROXEN 500 MG PO TABS: 500.0000 mg | ORAL_TABLET | Freq: Two times a day (BID) | ORAL | 0 refills | Status: DC

## 2017-11-06 MED ORDER — HYDROXYZINE HCL 50 MG PO TABS: 50.0000 mg | ORAL_TABLET | Freq: Three times a day (TID) | ORAL | 1 refills | Status: DC | PRN

## 2017-11-06 MED FILL — NAPROXEN 500 MG TABLET: 500 | 10 days supply | Qty: 20 | Fill #0

## 2017-11-06 MED FILL — hydrOXYzine HCL 50 MG TABS: 50 | 10 days supply | Qty: 30 | Fill #0

## 2017-11-06 NOTE — Progress Notes (Signed)
Subjective:  Patient ID: Theresa Watkins, female    DOB: Jan 23, 1966  Age: 51 y.o. MRN: [id number]  CC: Follow-up   HPI SHAVONDA WIEDMAN presents for complains of arthralgias for which has been present for 3 days. Pain is located in the left knee(s), is described as aching, and is moderately severe  .  Associated symptoms include: crepitation, decreased range of motion and tenderness.  The patient has tried nothing for pain relief. She reports history of fall in her yard. She reports anxious mood. Associated symptoms insomnia. Stressors include upcoming move. She denies any SI/HI. She declines speaking with LCSW at this time.    Outpatient Medications Prior to Visit  Medication Sig Dispense Refill  . amLODipine (NORVASC) 5 MG tablet Take 1 tablet (5 mg total) by mouth daily. 30 tablet 2  . aspirin 325 MG tablet Take 325 mg by mouth daily.    Marland Kitchen atorvastatin (LIPITOR) 10 MG tablet Take 1 tablet (10 mg total) by mouth daily. 90 tablet 3  . Calcium Carbonate-Vitamin D (CALCIUM + D PO) Take 2 tablets by mouth 2 (two) times daily.     . cetirizine (ZYRTEC) 10 MG tablet Take 1 tablet (10 mg total) by mouth daily. 30 tablet 6  . levothyroxine (SYNTHROID, LEVOTHROID) 112 MCG tablet Take 1 tablet (112 mcg total) by mouth daily. 90 tablet 0  . capsaicin (ZOSTRIX) 0.025 % cream Apply topically 2 (two) times daily as needed. (Patient not taking: Reported on 11/06/2017) 45 g 0  . diclofenac sodium (VOLTAREN) 1 % GEL Apply 2 g topically 4 (four) times daily as needed. (Patient not taking: Reported on 11/06/2017) 100 g 0  . levofloxacin (LEVAQUIN) 500 MG tablet Take 1 tablet (500 mg total) by mouth daily. (Patient not taking: Reported on 11/06/2017) 7 tablet 0  . omeprazole (PRILOSEC) 10 MG capsule Take 1 capsule (10 mg total) by mouth daily. (Patient not taking: Reported on 10/16/2017) 30 capsule 2   No facility-administered medications prior to visit.     ROS Review of Systems  Constitutional:  Negative.   Respiratory: Negative.   Cardiovascular: Negative.   Musculoskeletal: Positive for arthralgias.  Psychiatric/Behavioral: Positive for sleep disturbance. Negative for suicidal ideas. The patient is nervous/anxious.         Objective:  BP 93/60 (BP Location: Right Arm, Patient Position: Sitting, Cuff Size: Normal)   Pulse 77   Temp 98.1 F (36.7 C) (Oral)   Resp 16   Ht 5\' 6"  (1.676 m)   Wt 163 lb 12.8 oz (74.3 kg)   SpO2 96%   BMI 26.44 kg/m   BP/Weight 11/06/2017 10/16/2017 32/01/5572  Systolic BP 93 220 254  Diastolic BP 60 82 79  Wt. (Lbs) 163.8 163.8 165  BMI 26.44 26.84 27.04     Physical Exam  Constitutional: She appears well-developed and well-nourished.  Cardiovascular: Normal rate, regular rhythm, normal heart sounds and intact distal pulses.  Pulmonary/Chest: Effort normal and breath sounds normal.  Abdominal: Soft. Bowel sounds are normal.  Musculoskeletal:       Left knee: Tenderness found. Lateral joint line tenderness noted.  Crepitus to left knee  Skin: Skin is warm and dry.  Psychiatric: Her mood appears anxious. She expresses no homicidal and no suicidal ideation. She expresses no suicidal plans and no homicidal plans.  Nursing note and vitals reviewed.   Depression screen [facility] 2/9 11/06/2017 10/16/2017 07/17/2017  Decreased Interest 1 - 3  Down, Depressed, Hopeless 3 2 3   PHQ -  2 Score 4 2 6   Altered sleeping 3 2 3   Tired, decreased energy 1 1 2   Change in appetite 3 2 3   Feeling bad or failure about yourself  1 2 1   Trouble concentrating 1 1 1   Moving slowly or fidgety/restless 3 3 0  Suicidal thoughts 1 2 0  PHQ-9 Score 17 15 16     GAD 7 : Generalized Anxiety Score 11/06/2017 10/16/2017 07/17/2017 02/17/2017  Nervous, Anxious, on Edge 0 0 0 0  Control/stop worrying 1 3 2 1   Worry too much - different things 1 2 2 1   Trouble relaxing 0 0 2 1  Restless 0 0 1 0  Easily annoyed or irritable 3 1 1 3   Afraid - awful might happen 2 0  1 1  Total GAD 7 Score 7 6 9 7      Assessment & Plan:   1. Acute pain of left knee  - DG Knee Complete 4 Views Left; Future - naproxen (NAPROSYN) 500 MG tablet; Take 1 tablet (500 mg total) by mouth 2 (two) times daily with a meal.  Dispense: 20 tablet; Refill: 0  2. History of fall  - DG Knee Complete 4 Views Left; Future - naproxen (NAPROSYN) 500 MG tablet; Take 1 tablet (500 mg total) by mouth 2 (two) times daily with a meal.  Dispense: 20 tablet; Refill: 0  3. Adjustment disorder with anxious mood  - hydrOXYzine (ATARAX/VISTARIL) 50 MG tablet; Take 1 tablet (50 mg total) by mouth every 8 (eight) hours as needed for anxiety.  Dispense: 30 tablet; Refill: 1  4. Decreased ROM of left knee   5. Crepitus of joint of left knee      Follow-up: Return if symptoms worsen or fail to improve.   Alfonse Spruce FNP

## 2017-11-06 NOTE — Progress Notes (Signed)
Patient is here for a follow-up. Patient stated she would like a strong Rx for her arthritis shoulder pain and knee pain from falling. Patient fell 2 days ago and her left side is in pain.

## 2017-11-06 NOTE — Patient Instructions (Signed)

## 2017-11-14 ENCOUNTER — Telehealth: Payer: Self-pay | Admitting: *Deleted

## 2017-11-14 ENCOUNTER — Other Ambulatory Visit: Payer: Self-pay | Admitting: Family Medicine

## 2017-11-14 ENCOUNTER — Ambulatory Visit
Admission: RE | Admit: 2017-11-14 | Discharge: 2017-11-14 | Disposition: A | Discharge status: Home / Self Care | Payer: Medicaid Other | Source: Ambulatory Visit | Attending: Family Medicine | Admitting: Family Medicine

## 2017-11-14 DIAGNOSIS — N6320 Unspecified lump in the left breast, unspecified quadrant: Principal | ICD-10-CM

## 2017-11-14 DIAGNOSIS — N6325 Unspecified lump in the left breast, overlapping quadrants: Secondary | ICD-10-CM

## 2017-11-14 NOTE — Telephone Encounter (Signed)
Attempt to initiate PA :  [place]:[phone]

## 2017-12-25 ENCOUNTER — Ambulatory Visit: Payer: Medicaid Other | Admitting: Family Medicine

## 2017-12-29 ENCOUNTER — Ambulatory Visit: Payer: Medicaid Other | Admitting: Family Medicine

## 2018-01-06 MED FILL — ATORVASTATIN 10 MG TABLET: 10 | 30 days supply | Qty: 30 | Fill #3

## 2018-01-12 ENCOUNTER — Other Ambulatory Visit: Payer: Self-pay | Admitting: Family Medicine

## 2018-01-12 DIAGNOSIS — M25562 Pain in left knee: Secondary | ICD-10-CM

## 2018-01-12 DIAGNOSIS — Z9181 History of falling: Secondary | ICD-10-CM

## 2018-01-12 DIAGNOSIS — E039 Hypothyroidism, unspecified: Secondary | ICD-10-CM

## 2018-01-12 MED FILL — AMLODIPINE BESYLATE 5 MG TA: 5 | 30 days supply | Qty: 30 | Fill #0

## 2018-01-20 ENCOUNTER — Other Ambulatory Visit: Payer: Self-pay | Admitting: Pharmacist

## 2018-01-20 DIAGNOSIS — I10 Essential (primary) hypertension: Secondary | ICD-10-CM

## 2018-01-20 MED ORDER — AMLODIPINE BESYLATE 5 MG PO TABS: 5.0000 mg | ORAL_TABLET | Freq: Every day | ORAL | 2 refills | Status: DC

## 2018-01-23 MED FILL — LEVOTHYROXINE 112 MCG TAB: 112 | 30 days supply | Qty: 30 | Fill #0

## 2018-02-10 DIAGNOSIS — I10 Essential (primary) hypertension: Secondary | ICD-10-CM

## 2018-02-16 ENCOUNTER — Ambulatory Visit: Payer: Medicaid Other | Attending: Internal Medicine | Admitting: Internal Medicine

## 2018-02-16 ENCOUNTER — Encounter: Payer: Self-pay | Admitting: Internal Medicine

## 2018-02-16 VITALS — BP 145/85 | HR 65 | Temp 97.3°F | Resp 16 | Ht 65.5 in | Wt 167.0 lb

## 2018-02-16 DIAGNOSIS — G8929 Other chronic pain: Secondary | ICD-10-CM | POA: Diagnosis not present

## 2018-02-16 DIAGNOSIS — Z823 Family history of stroke: Secondary | ICD-10-CM | POA: Diagnosis not present

## 2018-02-16 DIAGNOSIS — M2061 Acquired deformities of toe(s), unspecified, right foot: Secondary | ICD-10-CM | POA: Diagnosis not present

## 2018-02-16 DIAGNOSIS — Z90710 Acquired absence of both cervix and uterus: Secondary | ICD-10-CM | POA: Insufficient documentation

## 2018-02-16 DIAGNOSIS — M545 Low back pain, unspecified: Secondary | ICD-10-CM

## 2018-02-16 DIAGNOSIS — Z888 Allergy status to other drugs, medicaments and biological substances status: Secondary | ICD-10-CM | POA: Insufficient documentation

## 2018-02-16 DIAGNOSIS — Z91041 Radiographic dye allergy status: Secondary | ICD-10-CM | POA: Insufficient documentation

## 2018-02-16 DIAGNOSIS — E785 Hyperlipidemia, unspecified: Secondary | ICD-10-CM | POA: Diagnosis not present

## 2018-02-16 DIAGNOSIS — Z79890 Hormone replacement therapy (postmenopausal): Secondary | ICD-10-CM | POA: Diagnosis not present

## 2018-02-16 DIAGNOSIS — I10 Essential (primary) hypertension: Secondary | ICD-10-CM | POA: Diagnosis not present

## 2018-02-16 DIAGNOSIS — Z79899 Other long term (current) drug therapy: Secondary | ICD-10-CM | POA: Insufficient documentation

## 2018-02-16 DIAGNOSIS — Z85850 Personal history of malignant neoplasm of thyroid: Secondary | ICD-10-CM | POA: Insufficient documentation

## 2018-02-16 DIAGNOSIS — Z8673 Personal history of transient ischemic attack (TIA), and cerebral infarction without residual deficits: Secondary | ICD-10-CM | POA: Diagnosis not present

## 2018-02-16 DIAGNOSIS — F17210 Nicotine dependence, cigarettes, uncomplicated: Secondary | ICD-10-CM | POA: Diagnosis not present

## 2018-02-16 DIAGNOSIS — E039 Hypothyroidism, unspecified: Secondary | ICD-10-CM | POA: Diagnosis not present

## 2018-02-16 DIAGNOSIS — K219 Gastro-esophageal reflux disease without esophagitis: Secondary | ICD-10-CM | POA: Insufficient documentation

## 2018-02-16 DIAGNOSIS — F17200 Nicotine dependence, unspecified, uncomplicated: Secondary | ICD-10-CM | POA: Diagnosis not present

## 2018-02-16 DIAGNOSIS — E890 Postprocedural hypothyroidism: Secondary | ICD-10-CM | POA: Insufficient documentation

## 2018-02-16 DIAGNOSIS — M722 Plantar fascial fibromatosis: Secondary | ICD-10-CM | POA: Insufficient documentation

## 2018-02-16 DIAGNOSIS — E559 Vitamin D deficiency, unspecified: Secondary | ICD-10-CM

## 2018-02-16 DIAGNOSIS — Z7982 Long term (current) use of aspirin: Secondary | ICD-10-CM | POA: Insufficient documentation

## 2018-02-16 NOTE — Progress Notes (Signed)
Patient ID: Theresa Watkins, female    DOB: 1966/03/08  MRN: [id number]  CC: re-establish   Subjective: Theresa Watkins is a 52 y.o. female who presents for chronic disease management and to establish with me as PCP.  Previous PCP was NP Ha[place]on who has left the practice Her concerns today include:  Patient with history of HTN, HL, CVA (LT basal ganglia hemorrhagic infarct 12/2015), hypothyroidism (hx of thyroid CA 2011), GERD, anxious mood, tob dep  1.  C/O having a cyst (actually a fibroma) on sole LT foot x 1 yr.  Has increased in size and is painful. Saw a podiatrist at the [place] last yr.  Surgery recommended but pt states they were waiting for her to be cleared by the neurologist after her CVA.  Also would like to have deformity of RT 2nd toe overlapping big toe to be corrected.  Has a bunion on the RT foot but this is not bothersome to her.  Wanting something for pain like what was given to her last yr in ED for same.  Review of chart - given Norco   2.  HTN/hx of CVA:  compliant with Norvasc, ASA and Lipitor.   No device to check BP -down to 3 cig/day, from 1/2 pk a day. Trying to quit.   3.  Thyroid:  Compliant with Levothyroxine  4.  C/o pain in shoulders and LT lower back.   Naprosyn works but not for foot and back.  Pain in back is in one particular spot that she can touch.  Feels like a small ball.  No radiation.    Patient Active Problem List   Diagnosis Date Noted  . Hyperlipemia 03/21/2017  . Headache 03/21/2017  . Tooth pain   . CVA (cerebral infarction) 12/27/2015  . Hypothyroidism due to acquired atrophy of thyroid   . HLD (hyperlipidemia)   . Hemiparesis affecting dominant side as late effect of stroke ([place])   . 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 Tobacco abuse   . Cocaine abuse ([place])   . ETOH abuse   . Hypothyroidism (acquired) 12/24/2015  . Acute ischemic stroke ([place]) 12/24/2015  . Stroke ([place]) 12/24/2015  . Drug abuse, cocaine  type ([place]) 05/09/2014     Current Outpatient Medications on File Prior to Visit  Medication Sig Dispense Refill  . amLODipine (NORVASC) 5 MG tablet Take 1 tablet (5 mg total) by mouth daily. 30 tablet 2  . aspirin 325 MG tablet Take 325 mg by mouth daily.    Marland Kitchen atorvastatin (LIPITOR) 10 MG tablet Take 1 tablet (10 mg total) by mouth daily. 90 tablet 3  . Calcium Carbonate-Vitamin D (CALCIUM + D PO) Take 2 tablets by mouth 2 (two) times daily.     . cetirizine (ZYRTEC) 10 MG tablet Take 1 tablet (10 mg total) by mouth daily. 30 tablet 6  . diclofenac sodium (VOLTAREN) 1 % GEL Apply 2 g topically 4 (four) times daily as needed. (Patient not taking: Reported on 11/06/2017) 100 g 0  . hydrOXYzine (ATARAX/VISTARIL) 50 MG tablet Take 1 tablet (50 mg total) by mouth every 8 (eight) hours as needed for anxiety. 30 tablet 1  . levothyroxine (SYNTHROID, LEVOTHROID) 112 MCG tablet TAKE 1 TABLET (112 MCG TOTAL) BY MOUTH DAILY. 90 tablet 0  . naproxen (NAPROSYN) 500 MG tablet Take 1 tablet (500 mg total) by mouth 2 (two) times daily with a meal. 20 tablet 0  . omeprazole (PRILOSEC) 10 MG  capsule Take 1 capsule (10 mg total) by mouth daily. (Patient not taking: Reported on 10/16/2017) 30 capsule 2   No current facility-administered medications on file prior to visit.     Allergies  Allergen Reactions  . Iohexol Hives, Itching and Other (See Comments)     Code: HIVES, Desc: pts tongue began itching post injection and throat burning, Onset Date: [id number]   . Proanthocyanidin Swelling    Swelling of the tongue  . Grapeseed Extract [Nutritional Supplements]     Tongue swelll  . Diphenhydramine Hcl Rash  . Tramadol Rash    Social History   Socioeconomic History  . Marital status: Divorced    Spouse name: Not on file  . Number of children: Not on file  . Years of education: Not on file  . Highest education level: Not on file  Occupational History  . Not on file  Social Needs  . Financial  resource strain: Not on file  . Food insecurity:    Worry: Not on file    Inability: Not on file  . Transportation needs:    Medical: Not on file    Non-medical: Not on file  Tobacco Use  . Smoking status: Light Tobacco Smoker    Packs/day: 0.10    Years: 25.00    Pack years: 2.50    Types: Cigarettes  . Smokeless tobacco: Never Used  . Tobacco comment: smoke 2 a day trying to quit  Substance and Sexual Activity  . Alcohol use: Yes    Alcohol/week: 0.0 oz    Comment: occ  . Drug use: Yes    Types: Cocaine    Comment: 1-2x monthly  . Sexual activity: Not Currently    Birth control/protection: Surgical  Lifestyle  . Physical activity:    Days per week: Not on file    Minutes per session: Not on file  . Stress: Not on file  Relationships  . Social connections:    Talks on phone: Not on file    Gets together: Not on file    Attends religious service: Not on file    Active member of club or organization: Not on file    Attends meetings of clubs or organizations: Not on file    Relationship status: Not on file  . Intimate partner violence:    Fear of current or ex partner: Not on file    Emotionally abused: Not on file    Physically abused: Not on file    Forced sexual activity: Not on file  Other Topics Concern  . Not on file  Social History Narrative  . Not on file    Family History  Problem Relation Age of Onset  . Bell's palsy Mother   . Cancer Father   . Stroke Father   . Breast cancer Cousin     Past Surgical History:  Procedure Laterality Date  . ABDOMINAL HYSTERECTOMY    . CESAREAN SECTION    . goiter     removed  . laparoscopic  1988   removal  of ectopic preg, ruptured tube  . THERAPEUTIC ABORTION    . THYROIDECTOMY  march 2011   cancer    ROS: Review of Systems As above PHYSICAL EXAM: BP (!) 145/85   Pulse 65   Temp (!) 97.3 F (36.3 C) (Oral)   Resp 16   Ht 5' 5.5" (1.664 m)   Wt 167 lb (75.8 kg)   SpO2 98%   BMI 27.37 kg/m  Wt  Readings from Last 3 Encounters:  02/16/18 167 lb (75.8 kg)  11/06/17 163 lb 12.8 oz (74.3 kg)  10/16/17 163 lb 12.8 oz (74.3 kg)  Repeat BP 128/78  Physical Exam  General appearance - alert, well appearing, middle age AAF and in no distress Mental status - alert, oriented to person, place, and time, normal mood, behavior, speech, dress, motor activity, and thought processes Chest - clear to auscultation, no wheezes, rales or rhonchi, symmetric air entry Heart - normal rate, regular rhythm, normal S1, S2, no murmurs, rubs, clicks or gallops Musculoskeletal - LT foot:  About 4 cm firm elongated mass felt in the instep of sole.  Nontender to touch RT foot:  2nd toe over lapse big toe Lower back:  No tenderness on palpation of the lumbosacral spine or surrounding paraspinal muscles.  I did not appreciate the small bulge that she was feeling in the left lumbar paraspinal muscle   Depression screen [facility] 2/9 02/16/2018 11/06/2017 10/16/2017  Decreased Interest 1 1 -  Down, Depressed, Hopeless 1 3 2   PHQ - 2 Score 2 4 2   Altered sleeping 3 3 2   Tired, decreased energy 1 1 1   Change in appetite 2 3 2   Feeling bad or failure about yourself  1 1 2   Trouble concentrating 2 1 1   Moving slowly or fidgety/restless 2 3 3   Suicidal thoughts 1 1 2   PHQ-9 Score 14 17 15     ASSESSMENT AND PLAN: 1. Plantar fascial fibromatosis of left foot -We will refer to podiatry.  I recommended taking Tylenol with the Naprosyn.  However patient declined stating that Tylenol interferes with her thyroid and her stomach ulcers.  I informed her that the Tylenol would not affect her thyroid.  Also I do not see any thing listed on her current problem list about gastric ulcers.  Patient states that she just does not want to take the Tylenol and if needed she will be seen in the emergency room. - Ambulatory referral to Podiatry  2. Toe deformity, acquired, right - Ambulatory referral to Podiatry  3. Essential  hypertension At goal based on repeat blood pressure.  Continue current medications  4. Chronic left-sided low back pain without sciatica She will continue Naprosyn as needed.  I recommended also trying a lidocaine patch since she feels that Naprosyn is not sufficiently controlling her pain and she does not want to take Tylenol.  Patient declined the patch  5. Tobacco dependence Strongly encouraged smoking cessation given her history of CVA  6. Acquired hypothyroidism - TSH  7. Vitamin D deficiency - Vitamin D, 25-hydroxy  Patient was given the opportunity to ask questions.  Patient verbalized understanding of the plan and was able to repeat key elements of the plan.   Orders Placed This Encounter  Procedures  . TSH  . Vitamin D, 25-hydroxy  . Ambulatory referral to Podiatry     Requested Prescriptions    No prescriptions requested or ordered in this encounter    Return in about 4 months (around 06/18/2018).  Karle Plumber, MD, FACP

## 2018-02-16 NOTE — Progress Notes (Signed)
Pt states her left foot and right houlder is hurting   Pt states she has cyst in her breast and it is hurting bad

## 2018-02-16 NOTE — Patient Instructions (Addendum)
You have been referred to a podiatrist for your foot.  Continue to work on trying to quit tobacco.  Fall Prevention in the [facility] can cause injuries. They can happen to people of all ages. There are many things you can do to make your home safe and to help prevent falls. What can I do on the outside of my home?  Regularly fix the edges of walkways and driveways and fix any cracks.  Remove anything that might make you trip as you walk through a door, such as a raised step or threshold.  Trim any bushes or trees on the path to your home.  Use bright outdoor lighting.  Clear any walking paths of anything that might make someone trip, such as rocks or tools.  Regularly check to see if handrails are loose or broken. Make sure that both sides of any steps have handrails.  Any raised decks and porches should have guardrails on the edges.  Have any leaves, snow, or ice cleared regularly.  Use sand or salt on walking paths during winter.  Clean up any spills in your garage right away. This includes oil or grease spills. What can I do in the bathroom?  Use night lights.  Install grab bars by the toilet and in the tub and shower. Do not use towel bars as grab bars.  Use non-skid mats or decals in the tub or shower.  If you need to sit down in the shower, use a plastic, non-slip stool.  Keep the floor dry. Clean up any water that spills on the floor as soon as it happens.  Remove soap buildup in the tub or shower regularly.  Attach bath mats securely with double-sided non-slip rug tape.  Do not have throw rugs and other things on the floor that can make you trip. What can I do in the bedroom?  Use night lights.  Make sure that you have a light by your bed that is easy to reach.  Do not use any sheets or blankets that are too big for your bed. They should not hang down onto the floor.  Have a firm chair that has side arms. You can use this for support while you get  dressed.  Do not have throw rugs and other things on the floor that can make you trip. What can I do in the kitchen?  Clean up any spills right away.  Avoid walking on wet floors.  Keep items that you use a lot in easy-to-reach places.  If you need to reach something above you, use a strong step stool that has a grab bar.  Keep electrical cords out of the way.  Do not use floor polish or wax that makes floors slippery. If you must use wax, use non-skid floor wax.  Do not have throw rugs and other things on the floor that can make you trip. What can I do with my stairs?  Do not leave any items on the stairs.  Make sure that there are handrails on both sides of the stairs and use them. Fix handrails that are broken or loose. Make sure that handrails are as long as the stairways.  Check any carpeting to make sure that it is firmly attached to the stairs. Fix any carpet that is loose or worn.  Avoid having throw rugs at the top or bottom of the stairs. If you do have throw rugs, attach them to the floor with carpet tape.  Make sure that  you have a light switch at the top of the stairs and the bottom of the stairs. If you do not have them, ask someone to add them for you. What else can I do to help prevent falls?  Wear shoes that: ? Do not have high heels. ? Have rubber bottoms. ? Are comfortable and fit you well. ? Are closed at the toe. Do not wear sandals.  If you use a stepladder: ? Make sure that it is fully opened. Do not climb a closed stepladder. ? Make sure that both sides of the stepladder are locked into place. ? Ask someone to hold it for you, if possible.  Clearly mark and make sure that you can see: ? Any grab bars or handrails. ? First and last steps. ? Where the edge of each step is.  Use tools that help you move around (mobility aids) if they are needed. These include: ? Canes. ? Walkers. ? Scooters. ? Crutches.  Turn on the lights when you go into a  dark area. Replace any light bulbs as soon as they burn out.  Set up your furniture so you have a clear path. Avoid moving your furniture around.  If any of your floors are uneven, fix them.  If there are any pets around you, be aware of where they are.  Review your medicines with your doctor. Some medicines can make you feel dizzy. This can increase your chance of falling. Ask your doctor what other things that you can do to help prevent falls. This information is not intended to replace advice given to you by your health care provider. Make sure you discuss any questions you have with your health care provider. Document Released: 08/31/2009 Document Revised: 04/11/2016 Document Reviewed: 12/09/2014 Elsevier Interactive Patient Education  [employer].

## 2018-02-17 LAB — VITAMIN D 25 HYDROXY (VIT D DEFICIENCY, FRACTURES): VIT D 25 HYDROXY: 23.5 ng/mL — AB (ref 30.0–100.0)

## 2018-02-17 LAB — TSH: TSH: 1.2 u[IU]/mL (ref 0.450–4.500)

## 2018-02-18 ENCOUNTER — Telehealth: Payer: Self-pay

## 2018-02-18 DIAGNOSIS — M2061 Acquired deformities of toe(s), unspecified, right foot: Secondary | ICD-10-CM | POA: Insufficient documentation

## 2018-02-18 DIAGNOSIS — E559 Vitamin D deficiency, unspecified: Secondary | ICD-10-CM | POA: Insufficient documentation

## 2018-02-18 DIAGNOSIS — M722 Plantar fascial fibromatosis: Secondary | ICD-10-CM | POA: Insufficient documentation

## 2018-02-18 NOTE — Telephone Encounter (Signed)
Contacted pt to go over lab results pt didn't answer left a detailed vm informing pt of results and she has any questions or concerns to give me a call  If pt calls back please give results: Thyroid level is normal. Vit D level improved but still low at 23 with goal being over 30. I recommend taking Vit D 400 IU tab 2 tablets daily.

## 2018-02-26 ENCOUNTER — Encounter: Payer: Self-pay | Admitting: Podiatry

## 2018-02-26 ENCOUNTER — Ambulatory Visit: Payer: Medicaid Other

## 2018-02-26 ENCOUNTER — Ambulatory Visit (INDEPENDENT_AMBULATORY_CARE_PROVIDER_SITE_OTHER): Payer: Medicaid Other

## 2018-02-26 ENCOUNTER — Ambulatory Visit: Payer: Medicaid Other | Admitting: Podiatry

## 2018-02-26 VITALS — BP 115/74 | HR 73 | Resp 16

## 2018-02-26 DIAGNOSIS — M722 Plantar fascial fibromatosis: Secondary | ICD-10-CM | POA: Diagnosis not present

## 2018-02-26 DIAGNOSIS — M2041 Other hammer toe(s) (acquired), right foot: Secondary | ICD-10-CM

## 2018-02-26 DIAGNOSIS — M7752 Other enthesopathy of left foot: Secondary | ICD-10-CM | POA: Diagnosis not present

## 2018-02-26 DIAGNOSIS — M779 Enthesopathy, unspecified: Principal | ICD-10-CM

## 2018-02-26 DIAGNOSIS — M778 Other enthesopathies, not elsewhere classified: Secondary | ICD-10-CM

## 2018-02-26 DIAGNOSIS — M2011 Hallux valgus (acquired), right foot: Secondary | ICD-10-CM

## 2018-02-26 MED FILL — LEVOTHYROXINE 112 MCG TAB: 112 | 30 days supply | Qty: 30 | Fill #0

## 2018-02-27 ENCOUNTER — Telehealth: Payer: Self-pay | Admitting: *Deleted

## 2018-02-27 DIAGNOSIS — M722 Plantar fascial fibromatosis: Secondary | ICD-10-CM

## 2018-02-27 NOTE — Telephone Encounter (Signed)
Faxed orders to [facility]. 

## 2018-02-27 NOTE — Telephone Encounter (Signed)
-----   Message from Rip Harbour, [facility] sent at 02/26/2018  5:21 PM EDT ----- Regarding: MRI MRI left foot - evaluate plantar fibroma left - surgical consideration

## 2018-02-28 NOTE — Progress Notes (Signed)
Subjective:  Patient ID: Theresa Watkins, female    DOB: 12/09/1965,  MRN: [id number] HPI Chief Complaint  Patient presents with  . Foot Pain    Plantar arch left - nodules in arch x 1 year or more, getting bigger and more painful  . Foot Pain    1st MPJ and 2nd toe right - hammer toe and bunion deformity for years, 2nd toe overlapping  . NOTE    Patient wants surgery - discussed May 31st with her - left foot first  . New Patient (Initial Visit)    52 y.o. female presents with the above complaint.   ROS: Denies fever chills nausea vomiting muscle aches pain chest pain shortness of breath calf pain headache.  Past Medical History:  Diagnosis Date  . Abnormal Pap smear   . Bronchitis   . Fibroid   . Headache(784.0)   . Ovarian cyst   . Seizures ([place])   . Stroke ([place])   . Thyroid cancer ([place]) 20011  . Trichomonas   . Urinary tract infection    Past Surgical History:  Procedure Laterality Date  . ABDOMINAL HYSTERECTOMY    . CESAREAN SECTION    . goiter     removed  . laparoscopic  1988   removal  of ectopic preg, ruptured tube  . THERAPEUTIC ABORTION    . THYROIDECTOMY  march 2011   cancer    Current Outpatient Medications:  .  amLODipine (NORVASC) 5 MG tablet, Take 1 tablet (5 mg total) by mouth daily., Disp: 30 tablet, Rfl: 2 .  aspirin 325 MG tablet, Take 325 mg by mouth daily., Disp: , Rfl:  .  atorvastatin (LIPITOR) 10 MG tablet, Take 1 tablet (10 mg total) by mouth daily., Disp: 90 tablet, Rfl: 3 .  Calcium Carbonate-Vitamin D (CALCIUM + D PO), Take 2 tablets by mouth 2 (two) times daily. , Disp: , Rfl:  .  cetirizine (ZYRTEC) 10 MG tablet, Take 1 tablet (10 mg total) by mouth daily., Disp: 30 tablet, Rfl: 6 .  diclofenac sodium (VOLTAREN) 1 % GEL, Apply 2 g topically 4 (four) times daily as needed. (Patient not taking: Reported on 11/06/2017), Disp: 100 g, Rfl: 0 .  hydrOXYzine (ATARAX/VISTARIL) 50 MG tablet, Take 1 tablet (50 mg total) by mouth every 8  (eight) hours as needed for anxiety., Disp: 30 tablet, Rfl: 1 .  levothyroxine (SYNTHROID, LEVOTHROID) 112 MCG tablet, TAKE 1 TABLET (112 MCG TOTAL) BY MOUTH DAILY., Disp: 90 tablet, Rfl: 0 .  naproxen (NAPROSYN) 500 MG tablet, Take 1 tablet (500 mg total) by mouth 2 (two) times daily with a meal., Disp: 20 tablet, Rfl: 0 .  omeprazole (PRILOSEC) 10 MG capsule, Take 1 capsule (10 mg total) by mouth daily. (Patient not taking: Reported on 10/16/2017), Disp: 30 capsule, Rfl: 2  Allergies  Allergen Reactions  . Iohexol Hives, Itching and Other (See Comments)     Code: HIVES, Desc: pts tongue began itching post injection and throat burning, Onset Date: [id number]   . Proanthocyanidin Swelling    Swelling of the tongue  . Grapeseed Extract [Nutritional Supplements]     Tongue swelll  . Diphenhydramine Hcl Rash  . Tramadol Rash   Review of Systems Objective:   Vitals:   02/26/18 1454  BP: 115/74  Pulse: 73  Resp: 16    General: Well developed, nourished, in no acute distress, alert and oriented x3   Dermatological: Skin is warm, dry and supple bilateral. Nails x 10 are  well maintained; remaining integument appears unremarkable at this time. There are no open sores, no preulcerative lesions, no rash or signs of infection present.  Vascular: Dorsalis Pedis artery and Posterior Tibial artery pedal pulses are 2/4 bilateral with immedate capillary fill time. Pedal hair growth present. No varicosities and no lower extremity edema present bilateral.   Neruologic: Grossly intact via light touch bilateral. Vibratory intact via tuning fork bilateral. Protective threshold with Semmes Wienstein monofilament intact to all pedal sites bilateral. Patellar and Achilles deep tendon reflexes 2+ bilateral. No Babinski or clonus noted bilateral.   Musculoskeletal: No gross boney pedal deformities bilateral. No pain, crepitus, or limitation noted with foot and ankle range of motion bilateral. Muscular strength  5/5 in all groups tested bilateral.  Large nonpulsatile soft tissue masses along the medial band of the plantar fascia from the first metatarsal head to the heel.  These are nonpulsatile nodule related the largest one appears to be about 3 cm x 3 cm.  Right foot demonstrates severe hallux valgus deformity with the track bound first metatarsophalangeal joint and a completely dislocated second toe that is marginally reducible at the PIPJ.  Medial deviation of the toe and varus rotation of the hammertoe and a valgus rotation of the hallux.  Gait: Unassisted, Nonantalgic.    Radiographs:  Radiographs taken today in the office demonstrates hallux abductovalgus deformity with severe hammertoe deformity of the right foot.  Overlapping second digit as a complete rupture of the second metatarsal phalangeal joint is most likely.  Left foot does not demonstrate any type of major osseous abnormalities other than a mild bunion deformity soft tissue increase in density plantarly is indicative of the fibromas.  Assessment & Plan:   Assessment: Plantar fibromatosis left foot exquisitely tender.  Right foot hallux valgus with overlapping hammertoe deformity second right.  Plan: Discussed etiology pathology conservative versus surgical therapies.  At this point we are requesting an MRI for surgical evaluation of her plantar fibromas.  It appears to take up a large majority of the plantar fascia.  I would like to know the parameters before we removed it.  We also discussed surgical intervention regarding the left foot consisting of a bunion repair and a second metatarsal osteotomy with a hammertoe repair and K wire.  I will follow-up with her once the MRI is complete.     Tracey Stewart T. [place], [place]

## 2018-03-02 ENCOUNTER — Telehealth: Payer: Self-pay | Admitting: *Deleted

## 2018-03-03 ENCOUNTER — Telehealth: Payer: Self-pay | Admitting: Podiatry

## 2018-03-03 NOTE — Telephone Encounter (Signed)
This is Teaching laboratory technician from [place]. Pt is scheduled for an MRI of the foot with and without contrast this Friday. Medicaid is requiring pre certification. CPT code is [phone] for that exam. If you have any questions, you can give me a call at [phone]. Thank you.

## 2018-03-03 NOTE — Telephone Encounter (Signed)
[name] [facility] pt would need to reschedule tomorrow's appt the MRI needed clinicals sent to [facility] for PA.

## 2018-03-03 NOTE — Telephone Encounter (Signed)
[employer] - Theresa Watkins states clinicals are required for prior authorization Case Reference# [id number]. Faxed clinicals and demographics to [facility].

## 2018-03-04 DIAGNOSIS — Z0271 Encounter for disability determination: Secondary | ICD-10-CM

## 2018-03-06 ENCOUNTER — Other Ambulatory Visit: Payer: Medicaid Other

## 2018-03-08 ENCOUNTER — Other Ambulatory Visit: Payer: Medicaid Other

## 2018-03-10 NOTE — Telephone Encounter (Signed)
[employer] faxed denial for MRI [id number] left foot without and with contrast, requires PEER to PEER.

## 2018-03-13 NOTE — Telephone Encounter (Signed)
Pt states she received a notice the MRI had been denied and was told to call our office to find out why. I told pt the notice we received states it did not meet their criteria for the exam and required a PEER TO PEER from Dr. Milinda Pointer to appeal, and either I or [facility] would contact her to schedule once approved.

## 2018-03-20 NOTE — Telephone Encounter (Signed)
[place] AUTHORIZED MRI [id number] LEFT FOOT WITH AND WITHOUT CONTRAST, AUTHORIZATION:  [id number], VALID 82 DAYS. I informed pt prior authorization had been received and she could schedule MRI with [facility] [phone].

## 2018-03-20 NOTE — Telephone Encounter (Signed)
Faxed PA and order to [facility].

## 2018-03-20 NOTE — Telephone Encounter (Signed)
I Faxed PA and order to [facility].

## 2018-03-28 ENCOUNTER — Ambulatory Visit
Admission: RE | Admit: 2018-03-28 | Discharge: 2018-03-28 | Disposition: A | Discharge status: Home / Self Care | Payer: Medicaid Other | Source: Ambulatory Visit | Attending: Podiatry | Admitting: Podiatry

## 2018-03-28 ENCOUNTER — Emergency Department (HOSPITAL_COMMUNITY)
Admission: EM | Admit: 2018-03-28 | Discharge: 2018-03-28 | Disposition: A | Discharge status: Home / Self Care | Payer: Medicaid Other | Attending: Emergency Medicine | Admitting: Emergency Medicine

## 2018-03-28 ENCOUNTER — Inpatient Hospital Stay
Admission: RE | Admit: 2018-03-28 | Discharge: 2018-03-28 | Disposition: A | Discharge status: Home / Self Care | Payer: Medicaid Other | Source: Ambulatory Visit | Attending: Podiatry | Admitting: Podiatry

## 2018-03-28 ENCOUNTER — Encounter (HOSPITAL_COMMUNITY): Payer: Self-pay

## 2018-03-28 DIAGNOSIS — L500 Allergic urticaria: Secondary | ICD-10-CM | POA: Diagnosis not present

## 2018-03-28 DIAGNOSIS — R220 Localized swelling, mass and lump, head: Secondary | ICD-10-CM | POA: Diagnosis present

## 2018-03-28 DIAGNOSIS — F17210 Nicotine dependence, cigarettes, uncomplicated: Secondary | ICD-10-CM | POA: Diagnosis not present

## 2018-03-28 DIAGNOSIS — Z79899 Other long term (current) drug therapy: Secondary | ICD-10-CM | POA: Insufficient documentation

## 2018-03-28 DIAGNOSIS — E039 Hypothyroidism, unspecified: Secondary | ICD-10-CM | POA: Insufficient documentation

## 2018-03-28 DIAGNOSIS — Z91041 Radiographic dye allergy status: Secondary | ICD-10-CM | POA: Insufficient documentation

## 2018-03-28 DIAGNOSIS — Z85850 Personal history of malignant neoplasm of thyroid: Secondary | ICD-10-CM | POA: Diagnosis not present

## 2018-03-28 DIAGNOSIS — Z7982 Long term (current) use of aspirin: Secondary | ICD-10-CM | POA: Diagnosis not present

## 2018-03-28 DIAGNOSIS — T508X5A Adverse effect of diagnostic agents, initial encounter: Secondary | ICD-10-CM

## 2018-03-28 DIAGNOSIS — I10 Essential (primary) hypertension: Secondary | ICD-10-CM | POA: Insufficient documentation

## 2018-03-28 DIAGNOSIS — M722 Plantar fascial fibromatosis: Secondary | ICD-10-CM

## 2018-03-28 MED ORDER — FAMOTIDINE IN NACL 20-0.9 MG/50ML-% IV SOLN
20.0000 mg | Freq: Once | INTRAVENOUS | Status: AC
  Administered 2018-03-28: 20 mg via INTRAVENOUS
  Filled 2018-03-28: qty 50

## 2018-03-28 MED ORDER — RANITIDINE HCL 150 MG PO TABS: 150.0000 mg | ORAL_TABLET | Freq: Two times a day (BID) | ORAL | 0 refills | Status: DC

## 2018-03-28 MED ORDER — HYDROXYZINE HCL 10 MG PO TABS: 10.0000 mg | ORAL_TABLET | Freq: Three times a day (TID) | ORAL | 0 refills | Status: DC | PRN

## 2018-03-28 MED ORDER — GADOBENATE DIMEGLUMINE 529 MG/ML IV SOLN
15.0000 mL | Freq: Once | INTRAVENOUS | Status: AC | PRN
  Administered 2018-03-28: 15 mL via INTRAVENOUS

## 2018-03-28 MED ORDER — PREDNISONE 50 MG PO TABS: ORAL_TABLET | ORAL | 0 refills | Status: DC

## 2018-03-28 MED ORDER — METHYLPREDNISOLONE SODIUM SUCC 125 MG IJ SOLR
80.0000 mg | Freq: Once | INTRAMUSCULAR | Status: AC
  Administered 2018-03-28: 80 mg via INTRAVENOUS
  Filled 2018-03-28: qty 2

## 2018-03-28 MED ORDER — HYDROXYZINE HCL 25 MG PO TABS
25.0000 mg | ORAL_TABLET | Freq: Once | ORAL | Status: AC
  Administered 2018-03-28: 25 mg via ORAL
  Filled 2018-03-28: qty 1

## 2018-03-28 MED ORDER — EPINEPHRINE 0.3 MG/0.3ML IJ SOAJ
0.3000 mg | Freq: Once | INTRAMUSCULAR | Status: AC
  Administered 2018-03-28: 0.3 mg via INTRAMUSCULAR
  Filled 2018-03-28: qty 0.3

## 2018-03-28 NOTE — ED Provider Notes (Signed)
[facility] DEPT Provider Note   CSN: [id number] Arrival date & time: 03/28/18  1154     History   Chief Complaint Chief Complaint  Patient presents with  . Allergic Reaction    HPI Theresa Watkins is a 52 y.o. female with multiple chronic health problems as listed in HPI, who arrived via [facility] from [facility]. Patient had allergic reaction to dye 10 minutes after it was injected for MRI procedure. Patient developed hives and "tickle" in throat. Patient was given Epi Pen. Patient is allergic to Benadryl. The MRI started at 10:00 am and she began having the allergic reaction soon after the IV dye was started.   HPI  Past Medical History:  Diagnosis Date  . Abnormal Pap smear   . Bronchitis   . Fibroid   . Headache(784.0)   . Ovarian cyst   . Seizures ([place])   . Stroke ([place])   . Thyroid cancer ([place]) 20011  . Trichomonas   . Urinary tract infection     Patient Active Problem List   Diagnosis Date Noted  . Vitamin D deficiency 02/18/2018  . Toe deformity, acquired, right 02/18/2018  . 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  . Hyperlipemia 03/21/2017  . Headache 03/21/2017  . Tooth pain   . CVA (cerebral infarction) 12/27/2015  . HLD (hyperlipidemia)   . Hemiparesis affecting dominant side as late effect of stroke ([place])   . Tobacco abuse   . Cocaine abuse ([place])   . ETOH abuse   . Hypothyroidism (acquired) 12/24/2015  . Drug abuse, cocaine type ([place]) 05/09/2014  . Hx of thyroid cancer 01/25/2014  . Essential hypertension 06/03/2012    Past Surgical History:  Procedure Laterality Date  . ABDOMINAL HYSTERECTOMY    . CESAREAN SECTION    . goiter     removed  . laparoscopic  1988   removal  of ectopic preg, ruptured tube  . THERAPEUTIC ABORTION    . THYROIDECTOMY  march 2011   cancer     OB History    Gravida  3   Para  1   Term  1   Preterm  0   AB  2   Living  1     SAB  0   TAB  1   Ectopic  1   Multiple  0   Live Births               Home Medications    Prior to Admission medications   Medication Sig Start Date End Date Taking? Authorizing Provider  amLODipine (NORVASC) 5 MG tablet Take 1 tablet (5 mg total) by mouth daily. 01/20/18  Yes Tresa Garter, MD  aspirin 325 MG tablet Take 325 mg by mouth daily.   Yes [provider]  atorvastatin (LIPITOR) 10 MG tablet Take 1 tablet (10 mg total) by mouth daily. 10/16/17  Yes Hairston, Maylon Peppers, FNP  Calcium Carbonate-Vitamin D (CALCIUM + D PO) Take 2 tablets by mouth 2 (two) times daily.    Yes [provider]  cetirizine (ZYRTEC) 10 MG tablet Take 1 tablet (10 mg total) by mouth daily. 02/17/17  Yes Hairston, Toy Baker R, FNP  levothyroxine (SYNTHROID, LEVOTHROID) 112 MCG tablet TAKE 1 TABLET (112 MCG TOTAL) BY MOUTH DAILY. 01/12/18  Yes Hairston, Mandesia R, FNP  VITAMIN A PO Take 1 tablet by mouth daily.   Yes [provider]  vitamin C (ASCORBIC ACID) 500 MG tablet Take 500  mg by mouth daily.   Yes [provider]  hydrOXYzine (ATARAX/VISTARIL) 10 MG tablet Take 1 tablet (10 mg total) by mouth 3 (three) times daily as needed. 03/28/18   Ashley Murrain, NP  predniSONE (DELTASONE) 50 MG tablet Take one tablet PO daily 03/29/18   Ashley Murrain, NP  ranitidine (ZANTAC) 150 MG tablet Take 1 tablet (150 mg total) by mouth 2 (two) times daily. 03/28/18   Ashley Murrain, NP    Family History Family History  Problem Relation Age of Onset  . Bell's palsy Mother   . Cancer Father   . Stroke Father   . Breast cancer Cousin     Social History Social History   Tobacco Use  . Smoking status: Light Tobacco Smoker    Packs/day: 0.10    Years: 25.00    Pack years: 2.50    Types: Cigarettes  . Smokeless tobacco: Never Used  . Tobacco comment: smoke 2 a day trying to quit  Substance Use Topics  . Alcohol use: Yes    Alcohol/week: 0.0 oz    Comment: occ  . Drug use: Yes     Types: Cocaine    Comment: 1-2x monthly     Allergies   Gadolinium derivatives; Iohexol; Proanthocyanidin; Grapeseed extract [nutritional supplements]; Diphenhydramine hcl; and Tramadol   Review of Systems Review of Systems  Constitutional: Negative for diaphoresis.  HENT: Positive for facial swelling. Negative for trouble swallowing.        Swelling of tongue and tingling sensation in throat that has improved after treatment prior to arrival to the ED.  Respiratory: Negative for shortness of breath.   Cardiovascular: Negative for chest pain.  Gastrointestinal: Negative for abdominal pain, nausea and vomiting.  Musculoskeletal: Negative for neck pain.  Skin: Positive for rash.       hives  Neurological: Negative for syncope. Headaches: mild.  Psychiatric/Behavioral: Negative for confusion.     Physical Exam Updated Vital Signs BP 130/70 (BP Location: Left Arm)   Pulse 90   Temp (!) 97.2 F (36.2 C) (Oral)   Resp 16   SpO2 97%   Physical Exam  Constitutional: She appears well-developed and well-nourished. No distress.  HENT:  Head: Normocephalic.  Nose: Nose normal.  Mouth/Throat: Uvula is midline and mucous membranes are normal. No posterior oropharyngeal edema. Posterior oropharyngeal erythema: mild.  Minimal swelling of the tongue.  Eyes: Pupils are equal, round, and reactive to light. Conjunctivae and EOM are normal.  Neck: Normal range of motion. Neck supple.  Cardiovascular: Normal rate and regular rhythm.  Pulmonary/Chest: Effort normal. No respiratory distress. She has no wheezes. She has no rales.  Abdominal: Soft. There is no tenderness.  Musculoskeletal: Normal range of motion.  Neurological: She is alert.  Skin: Skin is warm and dry. Rash noted.  Few areas of hives noted to arms and mid back.   Psychiatric: She has a normal mood and affect. Her behavior is normal.  Nursing note and vitals reviewed.    ED Treatments / Results  Labs (all labs ordered  are listed, but only abnormal results are displayed) Labs Reviewed - No data to display  Radiology Mr Foot Left W Wo Contrast  Result Date: 03/28/2018 CLINICAL DATA:  Left foot plantar fibroma. Mass for 1 year along the bottom of the foot. Painful. EXAM: MRI OF THE LEFT FOREFOOT WITHOUT AND WITH CONTRAST TECHNIQUE: Multiplanar, multisequence MR imaging of the left forefoot was performed both before and after administration of intravenous  contrast. CONTRAST:  15mL MULTIHANCE GADOBENATE DIMEGLUMINE 529 MG/ML IV SOLN COMPARISON:  None. FINDINGS: TENDONS Peroneal: Peroneal longus tendon intact. Peroneal brevis intact. Posteromedial: Posterior tibial tendon intact. Flexor hallucis longus tendon intact. Flexor digitorum longus tendon intact. Anterior: Tibialis anterior tendon intact. Extensor hallucis longus tendon intact Extensor digitorum longus tendon intact. Achilles:  Intact. Plantar Fascia: Intact. 3.5 x 0.6 x 2.6 cm heterogeneous soft tissue mass with mild enhancement involving the medial band of the plantar fascia approximately 6 cm from the calcaneal insertion most consistent with a plantar fibroma. LIGAMENTS Lateral: Anterior talofibular ligament intact. Calcaneofibular ligament intact. Posterior talofibular ligament intact. Anterior and posterior tibiofibular ligaments intact. Medial: Deltoid ligament intact. Spring ligament intact. CARTILAGE Ankle Joint: No joint effusion. Normal ankle mortise. No chondral defect. Subtalar Joints/Sinus Tarsi: Normal subtalar joints. No subtalar joint effusion. Normal sinus tarsi. Bones: Mild osteoarthritis of the talonavicular joint. Soft Tissue: No other soft tissue mass, fluid collection or hematoma. IMPRESSION: 1. 3.5 x 0.6 x 2.6 cm plantar fibroma involving the medial band of the plantar fascia approximately 6 cm from the calcaneal insertion. Electronically Signed   By: Kathreen Devoid   On: 03/28/2018 14:05    Procedures Procedures (including critical care  time)  Medications Ordered in ED Medications  famotidine (PEPCID) IVPB 20 mg premix (0 mg Intravenous Stopped 03/28/18 1347)  hydrOXYzine (ATARAX/VISTARIL) tablet 25 mg (25 mg Oral Given 03/28/18 1348)  methylPREDNISolone sodium succinate (SOLU-MEDROL) 125 mg/2 mL injection 80 mg (80 mg Intravenous Given 03/28/18 1316)  EPINEPHrine (EPI-PEN) injection 0.3 mg (0.3 mg Intramuscular Given by Other 03/28/18 1448)     Initial Impression / Assessment and Plan / ED Course  I have reviewed the triage vital signs and the nursing notes. 52 y.o. female brought by EMS from the imaging center for allergic reaction to IV dye. Patient stable for d/c without itching, difficulty swallowing or hives s/p treatment in the ED. Patient has eaten lunch and has been drinking without difficulty. Patient d/c home with prednisone, zantac, atarax and epi pen. Return precautions discussed.   Final Clinical Impressions(s) / ED Diagnoses   Final diagnoses:  Allergic reaction to contrast material, initial encounter    ED Discharge Orders        Ordered    predniSONE (DELTASONE) 50 MG tablet     03/28/18 1425    ranitidine (ZANTAC) 150 MG tablet  2 times daily     03/28/18 1425    hydrOXYzine (ATARAX/VISTARIL) 10 MG tablet  3 times daily PRN     03/28/18 1425       Janit Bern [place], [place] 03/28/18 1853    Merrily Pew, MD 03/29/18 1118

## 2018-03-28 NOTE — ED Triage Notes (Signed)
Patient arrived via [facility] from [facility]. Allergic reaction to dye (15 ml Multihance) 10 min after injection for MRI. Facility states she had hives and patient c/o of tickle in throat. 0.3 of epi given per facility. Patient allergic to benadryl. A/Ox4. Lungs are clear bilaterally. 22 right ac. 12 lead unremarkable per ems.

## 2018-03-28 NOTE — ED Notes (Signed)
Patient comfortable and able to speak in complete sentences. Vital signs are stable.

## 2018-03-28 NOTE — Consult Note (Signed)
Called to check patient around 11:15 am, after contrast administration today.  Paient received 15 cc Multihance for MRI of the left foot at [facility]. After administration, patient reports that she started sneezing, and felt a tingling sensation across her throat.  She then started itching. No h/o allergic reaction at the time of MRI's in the past.   Patient is allergic to Benadryl. On exam, patient is alert and calm.  She is scratching her arms. Hives are identified on her neck, arms, and back.  Initially patient had no complaint of mouth symptoms, but later reported her tongue felt swollen and it felt different to swallow.   Mouth/tongue appear normal. VS: 170's/80's Pulse 70's O2 sats 98-99. Due to Benadryl allergy, patient was given .03 mg of SQ epi.  Over the next 10-15 minutes, patient reported feeling a little better.  EMS called to take patient to ED for further evaluation. Discussed history with EMS and patient was transferred.   Patient and her mother were informed of the plan and agreed. IV left in place. [phone].

## 2018-03-28 NOTE — Discharge Instructions (Addendum)
If your symptoms return use the epi pen and return to the ED.

## 2018-03-28 NOTE — ED Notes (Signed)
Bed: YC14 Expected date: 03/28/18 Expected time: 11:54 AM Means of arrival: Ambulance Comments: Allergic reaction to IV dye

## 2018-03-30 ENCOUNTER — Telehealth: Payer: Self-pay | Admitting: *Deleted

## 2018-03-30 NOTE — Telephone Encounter (Signed)
I spoke with pt and told her in the 02/26/2018 visit Dr. Milinda Pointer had said he had wanted to get the MRI for surgical planning and details, that she would need to return to discuss the results and sign consent forms, I told pt that was why we had called to discuss results, pre-op, signing the consent and post-op care. Pt asked if that could be done all in one visit. I told pt it could but if travel was a problem we would need to make sure she could get back and forth from the surgery and her post op appts. Pt states understanding and could get transportation.

## 2018-03-30 NOTE — Telephone Encounter (Signed)
-----   Message from Jilda Roche sent at 03/30/2018  3:06 PM EDT ----- Call transferred back to RN/ pt upset that she has to come in for MRI Results. Per Pt. She cant keep getting rides to come to office. ----- Message ----- From: Andres Ege, RN Sent: 03/30/2018   9:03 AM To: Jilda Roche, Arcelia Jew and Levada Dy, please contact pt for an consultation appt. Marcy Siren ----- Message ----- From: Garrel Ridgel, [place] Sent: 03/30/2018   8:01 AM To: Andres Ege, RN  Have her in for a surgical consult.

## 2018-03-30 NOTE — Telephone Encounter (Signed)
Pt's home phone rang for over a minute, then sounded like it was picked up and I left message that I was calling to discuss the radiology and office appt and the phone clicked off.

## 2018-03-30 NOTE — Telephone Encounter (Signed)
I explained to Theresa Watkins the need for the consultation appt and explained Dr. Milinda Pointer wanted her to make an appt to discuss.

## 2018-03-30 NOTE — Telephone Encounter (Signed)
A. Horton - scheduler called pt to schedule to come in to discuss results, surgical treatment. Pt states she does not know why she has to come in to discuss surgery and pay a co-pay when she is scheduled for surgery on the 31st. Pt was transferred to my phone, but did not leave a message.

## 2018-04-01 MED FILL — LEVOTHYROXINE 112 MCG TAB: 112 | 30 days supply | Qty: 30 | Fill #1

## 2018-04-07 ENCOUNTER — Encounter: Payer: Self-pay | Admitting: Podiatry

## 2018-04-07 ENCOUNTER — Ambulatory Visit (INDEPENDENT_AMBULATORY_CARE_PROVIDER_SITE_OTHER): Payer: Medicaid Other | Admitting: Podiatry

## 2018-04-07 DIAGNOSIS — M722 Plantar fascial fibromatosis: Secondary | ICD-10-CM

## 2018-04-07 NOTE — Telephone Encounter (Signed)
Note created in error.

## 2018-04-07 NOTE — Progress Notes (Signed)
She presents today with her mother to discuss her MRI findings.  States that her left foot is still painful and is becoming more painful over the last few days.  Objective: Vital signs are stable she is alert and oriented x3.  MRI does demonstrate a plantar fibroma which appears to be heterogeneous soft tissue mass measuring 3.5 x 0.6 x 2.6 cm.  This is exquisitely painful palpation pulses remain strong and palpable.  Assessment: Plantar fibromatosis left.  Plan: Consented her today for surgical excision of plantar fibroma and application of the cast.  She understands this and is amenable to it.  We did discuss the possible postop complications which may include but are not limited to postop pain bleeding swelling infection recurrence need for further surgery overcorrection under correction loss of digit loss of limb loss of life.  She understands that she will be not walking on this for at least 3 to 4 weeks I will follow-up with her in the near future for surgery.

## 2018-04-07 NOTE — Patient Instructions (Signed)
Pre-Operative Instructions  Congratulations, you have decided to take an important step towards improving your quality of life.  You can be assured that the doctors and staff at [facility] will be with you every step of the way.  Here are some important things you should know:  1. Plan to be at the surgery center/hospital at least 1 (one) hour prior to your scheduled time, unless otherwise directed by the surgical center/hospital staff.  You must have a responsible adult accompany you, remain during the surgery and drive you home.  Make sure you have directions to the surgical center/hospital to ensure you arrive on time. 2. If you are having surgery at [facility] or [facility], you will need a copy of your medical history and physical form from your family physician within one month prior to the date of surgery. We will give you a form for your primary physician to complete.  3. We make every effort to accommodate the date you request for surgery.  However, there are times where surgery dates or times have to be moved.  We will contact you as soon as possible if a change in schedule is required.   4. No aspirin/ibuprofen for one week before surgery.  If you are on aspirin, any non-steroidal anti-inflammatory medications (Mobic, Aleve, Ibuprofen) should not be taken seven (7) days prior to your surgery.  You make take Tylenol for pain prior to surgery.  5. Medications - If you are taking daily heart and blood pressure medications, seizure, reflux, allergy, asthma, anxiety, pain or diabetes medications, make sure you notify the surgery center/hospital before the day of surgery so they can tell you which medications you should take or avoid the day of surgery. 6. No food or drink after midnight the night before surgery unless directed otherwise by surgical center/hospital staff. 7. No alcoholic beverages 24-hours prior to surgery.  No smoking 24-hours prior or 24-hours after  surgery. 8. Wear loose pants or shorts. They should be loose enough to fit over bandages, boots, and casts. 9. Don't wear slip-on shoes. Sneakers are preferred. 10. Bring your boot with you to the surgery center/hospital.  Also bring crutches or a walker if your physician has prescribed it for you.  If you do not have this equipment, it will be provided for you after surgery. 11. If you have not been contacted by the surgery center/hospital by the day before your surgery, call to confirm the date and time of your surgery. 12. Leave-time from work may vary depending on the type of surgery you have.  Appropriate arrangements should be made prior to surgery with your employer. 13. Prescriptions will be provided immediately following surgery by your doctor.  Fill these as soon as possible after surgery and take the medication as directed. Pain medications will not be refilled on weekends and must be approved by the doctor. 14. Remove nail polish on the operative foot and avoid getting pedicures prior to surgery. 15. Wash the night before surgery.  The night before surgery wash the foot and leg well with water and the antibacterial soap provided. Be sure to pay special attention to beneath the toenails and in between the toes.  Wash for at least three (3) minutes. Rinse thoroughly with water and dry well with a towel.  Perform this wash unless told not to do so by your physician.  Enclosed: 1 Ice pack (please put in freezer the night before surgery)   1 Hibiclens skin cleaner     Pre-op instructions  If you have any questions regarding the instructions, please do not hesitate to call our office.  [place]: [address], [place], [place] [address] -- [phone]  [place]: [address] , [place] [address] -- [phone]  [place]: [address]  [place], [place] [address] -- [phone]  [place]: [address], [address], [place], [place] [address] -- [phone]  Website:  [email or web address] 

## 2018-04-15 ENCOUNTER — Other Ambulatory Visit: Payer: Self-pay | Admitting: Podiatry

## 2018-04-15 MED ORDER — CEPHALEXIN 500 MG PO CAPS: 500.0000 mg | ORAL_CAPSULE | Freq: Three times a day (TID) | ORAL | 0 refills | Status: DC

## 2018-04-15 MED ORDER — OXYCODONE-ACETAMINOPHEN 10-325 MG PO TABS: 1.0000 | ORAL_TABLET | Freq: Four times a day (QID) | ORAL | 0 refills | Status: AC | PRN

## 2018-04-15 MED ORDER — ONDANSETRON HCL 4 MG PO TABS: 4.0000 mg | ORAL_TABLET | Freq: Three times a day (TID) | ORAL | 0 refills | Status: DC | PRN

## 2018-04-17 ENCOUNTER — Telehealth: Payer: Self-pay | Admitting: *Deleted

## 2018-04-17 ENCOUNTER — Encounter: Payer: Self-pay | Admitting: Podiatry

## 2018-04-17 DIAGNOSIS — M722 Plantar fascial fibromatosis: Secondary | ICD-10-CM | POA: Diagnosis not present

## 2018-04-17 DIAGNOSIS — E7800 Pure hypercholesterolemia, unspecified: Secondary | ICD-10-CM | POA: Diagnosis not present

## 2018-04-17 DIAGNOSIS — M25572 Pain in left ankle and joints of left foot: Secondary | ICD-10-CM | POA: Diagnosis not present

## 2018-04-17 DIAGNOSIS — D492 Neoplasm of unspecified behavior of bone, soft tissue, and skin: Secondary | ICD-10-CM | POA: Diagnosis not present

## 2018-04-17 DIAGNOSIS — M79672 Pain in left foot: Secondary | ICD-10-CM | POA: Diagnosis not present

## 2018-04-17 NOTE — Telephone Encounter (Signed)
[name] states pt's percocet is over the recommended mEq of morephine, so will change order to one tablet every 8 hours. Dr. Marta Antu.

## 2018-04-20 ENCOUNTER — Telehealth: Payer: Self-pay | Admitting: *Deleted

## 2018-04-20 ENCOUNTER — Encounter: Payer: Self-pay | Admitting: Podiatry

## 2018-04-20 NOTE — Telephone Encounter (Signed)
POST OP CALL-    1) General condition stated by the patient: OKAY, LIGHTHEADED  2) Is the pt having pain? YES, BETTER THAN YESTERDAY  3) Pain score:   4) Has the pt taken Rx'd medication? YES, REGULARLY  5) Is the pain medication giving relief? YES  6) Any fever, chills, nausea, or vomiting? NO  7) Any shortness of breath or tightness in the calf? NO  8) Is the bandages clean, dry and intact? YES  9) Is the bandage excessively tight? NO  10) Is there excessive bleeding or drainage coming through the bandage? NO  11) Did you understand all of the post op instruction sheet given? YES  12) Any questions or concerns regarding post op care/recovery? PATIENT STATES DR HYATT PUT HER IN A BOOT INSTEAD OF A CAST, BUT SHE DID GET A KNEE SCOOTER TODAY AND IS ABLE TO GET AROUND BETTER. ADVISED TO MAKE SURE SHE KEPT BOOT ON AT ALL TIMES    Confirmed POV appointment with patient

## 2018-04-23 ENCOUNTER — Ambulatory Visit (INDEPENDENT_AMBULATORY_CARE_PROVIDER_SITE_OTHER): Payer: Self-pay | Admitting: Podiatry

## 2018-04-23 ENCOUNTER — Encounter: Payer: Self-pay | Admitting: Podiatry

## 2018-04-23 VITALS — BP 120/71 | HR 78 | Temp 98.2°F

## 2018-04-23 DIAGNOSIS — M722 Plantar fascial fibromatosis: Secondary | ICD-10-CM

## 2018-04-23 MED ORDER — OXYCODONE-ACETAMINOPHEN 5-325 MG PO TABS: 1.0000 | ORAL_TABLET | ORAL | 0 refills | Status: DC | PRN

## 2018-04-23 NOTE — Progress Notes (Signed)
She presents today for her first postop visit.  She denies fever chills nausea vomiting muscle aches pains of the right foot is very painful.  She is wondering if anything happened during surgery because her elbow was painful as well and she is also concerned that there may be a needle still is left in her arm and that there may be pins or something stuck in the back of her heel.  She is also concerned that the IV was changed from the left hand to the right hand according to the mother.  She states that she is been using her knee scooter but she has a lot doing her home and that she needs to be up and about.  Objective: Vital signs are stable she is alert and oriented x3.  Pulses are strong and palpable.  Neurologic sensorium is intact.  Dry sterile dressing was intact was removed demonstrates sutures are intact margins well coapted there is nothing sticking out the back of her heel and is wondering if the boot may be rubbing her heel it is made it [place] or possibly even the dressing.  Assessment: According to physical evaluation the foot appears to be healing fine staples are intact margins are coapting.  Plan: Redressed today dresser compressive dressing encouraged her to go by the emergency department if she feels that there is something wrong with her elbow.  I will follow-up with her in a week and we gave her another dose of pain medication.

## 2018-04-30 ENCOUNTER — Ambulatory Visit (INDEPENDENT_AMBULATORY_CARE_PROVIDER_SITE_OTHER): Payer: Self-pay

## 2018-04-30 DIAGNOSIS — M722 Plantar fascial fibromatosis: Secondary | ICD-10-CM

## 2018-04-30 MED ORDER — OXYCODONE-ACETAMINOPHEN 5-325 MG PO TABS: 1.0000 | ORAL_TABLET | ORAL | 0 refills | Status: DC | PRN

## 2018-05-04 NOTE — Progress Notes (Signed)
Patient presents s/p plantar fascial fibromectomy DOS 5.31.19. She states that she is having some sharp shooting type pains in her foot from time to time, but overall she is doing well.    Noted well healing post operative incision, all sutures and staples are intact, no gapping, no drainage, mild swelling WNL. No redness or warmth.   She is to keep her foot dry and dressing intact, remain NWB to left foot and keep her boot on at all times. Follow up next week for suture removal and to assess healing. Current pain management therapies were discussed in great length along with benefits of resting and elevating foot. No additional pain medications will be prescribed at this time

## 2018-05-06 MED FILL — LEVOTHYROXINE 112 MCG TAB: 112 | 30 days supply | Qty: 30 | Fill #2

## 2018-05-07 ENCOUNTER — Encounter: Payer: Self-pay | Admitting: Podiatry

## 2018-05-07 ENCOUNTER — Ambulatory Visit (INDEPENDENT_AMBULATORY_CARE_PROVIDER_SITE_OTHER): Payer: Medicaid Other | Admitting: Podiatry

## 2018-05-07 ENCOUNTER — Telehealth: Payer: Self-pay | Admitting: Podiatry

## 2018-05-07 DIAGNOSIS — M722 Plantar fascial fibromatosis: Secondary | ICD-10-CM

## 2018-05-07 DIAGNOSIS — Z9889 Other specified postprocedural states: Secondary | ICD-10-CM

## 2018-05-07 MED ORDER — OXYCODONE-ACETAMINOPHEN 5-325 MG PO TABS: 1.0000 | ORAL_TABLET | Freq: Three times a day (TID) | ORAL | 0 refills | Status: DC | PRN

## 2018-05-07 NOTE — Progress Notes (Signed)
She presents today for a follow-up of her plantar fibroma excision date of surgery 04/17/2018.  States is doing fine other than some burning.  She does get some cramping in her toes.  Objective: Vital signs are stable she is alert and oriented times 3 sutures are intact was removed demonstrates staples are intact and every other staple was removed which did demonstrate some gapping.  No erythema cellulitis drainage or odor  Assessment: Well-healing surgical foot.  Plan: Remove some of the staples today all the sutures.  Follow-up with her in 1 week rest of dressing was applied.  When she comes in next week we will use lidocaine cream.

## 2018-05-07 NOTE — Telephone Encounter (Signed)
I told pt the PA for the percocet could take up to 2-82 days for pre-cert, she could choose to self-pay. Pt states she doesn't understand why a pre-cert is needed for a medication she has been getting weekly. I told her I didn't know but the pre-cert would take about 7-10 days to return.

## 2018-05-07 NOTE — Telephone Encounter (Signed)
Pharmacy is faxing paperwork over to be signed before they can distribute medication.

## 2018-05-13 ENCOUNTER — Ambulatory Visit
Admission: RE | Admit: 2018-05-13 | Discharge: 2018-05-13 | Disposition: A | Discharge status: Home / Self Care | Payer: Medicaid Other | Source: Ambulatory Visit | Attending: Family Medicine | Admitting: Family Medicine

## 2018-05-13 ENCOUNTER — Ambulatory Visit: Payer: Medicaid Other

## 2018-05-13 DIAGNOSIS — N6325 Unspecified lump in the left breast, overlapping quadrants: Secondary | ICD-10-CM

## 2018-05-13 DIAGNOSIS — N6320 Unspecified lump in the left breast, unspecified quadrant: Principal | ICD-10-CM

## 2018-05-13 NOTE — Telephone Encounter (Signed)
Faxed completed [place] [facility] for Prior Approval - Short-Acting Opoid Analgesic, demographics and clinicals to [facility].

## 2018-05-14 ENCOUNTER — Ambulatory Visit (INDEPENDENT_AMBULATORY_CARE_PROVIDER_SITE_OTHER): Payer: Self-pay

## 2018-05-14 DIAGNOSIS — M722 Plantar fascial fibromatosis: Secondary | ICD-10-CM

## 2018-05-14 MED ORDER — OXYCODONE-ACETAMINOPHEN 5-325 MG PO TABS: 1.0000 | ORAL_TABLET | Freq: Three times a day (TID) | ORAL | 0 refills | Status: DC | PRN

## 2018-05-15 NOTE — Progress Notes (Signed)
Patient presents s/p plantar fascial fibromectomy DOS 5.31.19. She states that her foot is feeling better this week  Noted well healing post operative incision, remaining staples were removed. Wound edges aligned and approximated with no gapping noted. Redressed with dry sterile dressing  She can start to bear weight onto her post operative foot as tolerated, with use of scooter at first. Detailed instructions were given and shown to patient and her son in regards to ambulation. She was told to remain in her boot at all times. She can get her foot wet at this time. Pain management moving forward will be as follows: no more refills on Percocet 5/325mg . She is allowed to have Vicodin 5/325mg  if there is no intolerance. Follow up in two weeks or sooner with any issues.

## 2018-05-19 ENCOUNTER — Telehealth: Payer: Self-pay | Admitting: Podiatry

## 2018-05-19 NOTE — Telephone Encounter (Signed)
I told pt I had reviewed his last office note and it said she could get the foot wet, but it did not see the orders to soak. Pt states Dr. Amalia Hailey said to soak the foot until it was wrinkled. Pt states she will call tomorrow.

## 2018-05-19 NOTE — Telephone Encounter (Signed)
Pt wondering how often she should be soaking.

## 2018-05-20 NOTE — Telephone Encounter (Signed)
Epsom salt water qod.

## 2018-05-20 NOTE — Telephone Encounter (Signed)
Left message informing pt of DR. Hyatt's orders.

## 2018-05-20 NOTE — Telephone Encounter (Signed)
This is a Dr. Milinda Pointer Patient. I'm not sure what the foot looks like.  Dr. Amalia Hailey

## 2018-05-25 ENCOUNTER — Emergency Department (HOSPITAL_COMMUNITY): Admit: 2018-05-25 | Payer: Medicaid Other

## 2018-05-25 ENCOUNTER — Emergency Department (HOSPITAL_COMMUNITY)
Admission: EM | Admit: 2018-05-25 | Discharge: 2018-05-25 | Discharge status: Against Medical Advice | Payer: Medicaid Other | Attending: Emergency Medicine | Admitting: Emergency Medicine

## 2018-05-25 ENCOUNTER — Emergency Department (HOSPITAL_COMMUNITY): Payer: Medicaid Other

## 2018-05-25 ENCOUNTER — Encounter (HOSPITAL_COMMUNITY): Payer: Self-pay

## 2018-05-25 ENCOUNTER — Emergency Department (HOSPITAL_BASED_OUTPATIENT_CLINIC_OR_DEPARTMENT_OTHER): Payer: Medicaid Other

## 2018-05-25 ENCOUNTER — Telehealth: Payer: Self-pay | Admitting: Podiatry

## 2018-05-25 ENCOUNTER — Other Ambulatory Visit: Payer: Self-pay

## 2018-05-25 DIAGNOSIS — M79672 Pain in left foot: Secondary | ICD-10-CM | POA: Insufficient documentation

## 2018-05-25 DIAGNOSIS — R609 Edema, unspecified: Secondary | ICD-10-CM

## 2018-05-25 DIAGNOSIS — R0602 Shortness of breath: Secondary | ICD-10-CM | POA: Diagnosis not present

## 2018-05-25 DIAGNOSIS — M79605 Pain in left leg: Secondary | ICD-10-CM | POA: Diagnosis not present

## 2018-05-25 DIAGNOSIS — M7989 Other specified soft tissue disorders: Secondary | ICD-10-CM | POA: Diagnosis not present

## 2018-05-25 DIAGNOSIS — R2242 Localized swelling, mass and lump, left lower limb: Secondary | ICD-10-CM | POA: Insufficient documentation

## 2018-05-25 HISTORY — DX: Plantar fascial fibromatosis: M72.2

## 2018-05-25 LAB — BASIC METABOLIC PANEL
Anion gap: 7 (ref 5–15)
BUN: 10 mg/dL (ref 6–20)
CO2: 20 mmol/L — ABNORMAL LOW (ref 22–32)
Calcium: 7.9 mg/dL — ABNORMAL LOW (ref 8.9–10.3)
Chloride: 112 mmol/L — ABNORMAL HIGH (ref 98–111)
Creatinine, Ser: 0.84 mg/dL (ref 0.44–1.00)
GFR calc Af Amer: >60 mL/min (ref >60)
GFR calc non Af Amer: >60 mL/min (ref >60)
Glucose, Bld: 99 mg/dL (ref 70–99)
Potassium: 3.7 mmol/L (ref 3.5–5.1)
Sodium: 139 mmol/L (ref 135–145)

## 2018-05-25 LAB — CBC
HCT: 44.9 % (ref 36.0–46.0)
Hemoglobin: 14.3 g/dL (ref 12.0–15.0)
MCH: 27.6 pg (ref 26.0–34.0)
MCHC: 31.8 g/dL (ref 30.0–36.0)
MCV: 86.5 fL (ref 78.0–100.0)
Platelets: 181 10*3/uL (ref 150–400)
RBC: 5.19 MIL/uL — ABNORMAL HIGH (ref 3.87–5.11)
RDW: 13.8 % (ref 11.5–15.5)
WBC: 5 10*3/uL (ref 4.0–10.5)

## 2018-05-25 LAB — I-STAT TROPONIN, ED: Troponin i, poc: 0 ng/mL (ref 0.00–0.08)

## 2018-05-25 LAB — D-DIMER, QUANTITATIVE: D-Dimer, Quant: <0.27 ug/mL-FEU (ref 0.00–0.50)

## 2018-05-25 MED ORDER — OXYCODONE-ACETAMINOPHEN 5-325 MG PO TABS: 1.0000 | ORAL_TABLET | Freq: Three times a day (TID) | ORAL | 0 refills | Status: DC | PRN

## 2018-05-25 NOTE — ED Notes (Signed)
Pt seen talking to [employer] in lobby and then seen walking out of ED

## 2018-05-25 NOTE — Telephone Encounter (Signed)
Pt presented to office to pick up Percocet rx and requested to speak to me. Pt states she keeps falling asleep and she thinks she will go to the ED. I told pt if she felt that bad she should go to the ED, I asked pt if she felt short of breath. Pt denied being short of breath, but she sounded short of breath. I told pt to go to the ED, tell them she had left leg swelling and pain, and felt tired, had a venous doppler scheduled for tomorrow, but felt too bad to wait.

## 2018-05-25 NOTE — ED Notes (Signed)
Pt decided to leave stating that she has a "vascular appointment tomorrow."

## 2018-05-25 NOTE — Telephone Encounter (Addendum)
Left message [place] to call to schedule venous doppler today or tomorrow. Left message [facility] to call to schedule venous doppler today or tomorrow.

## 2018-05-25 NOTE — Telephone Encounter (Signed)
[place] -MEDICAID AUTHORIZATION NUMBER: [id number], VALID 05/25/2018 - 06/24/2018. Faxed to [facility].

## 2018-05-25 NOTE — Telephone Encounter (Signed)
I informed pt of her appt with [place] for the dopplers tomorrow. Pt states her foot hurts and has been a week with this pain and would like a refill. I told pt she could pick up the refill in the [facility].

## 2018-05-25 NOTE — Telephone Encounter (Signed)
[place] [facility] states can schedule pt for left venous doppler on 05/26/2018 arrive 10:45am for 11:00am test.

## 2018-05-25 NOTE — Telephone Encounter (Signed)
Pt states she is having the feeling of fever and the surgical leg is swollen and she has sharp pains in her leg and her calf hurts.

## 2018-05-25 NOTE — ED Provider Notes (Signed)
Patient placed in Quick Look pathway, seen and evaluated   Chief Complaint: Leg swelling, shortness of breath  HPI:   Patient is status post left foot surgery which occurred in May.  Has had ongoing waxing and waning pain with this significantly worsened 5 days ago and is now in her left calf which is new.  She notes swelling of the left lower extremity.  Was also noted to be short of breath by the nurse at the foot center who recommended presentation to the ED for rule out of DVT/PE.  She denies hemoptysis but has had a cough but this is chronic and unchanged for her.  Thinks she may have been febrile last night but did not take her temperature.  Denies chest pain but does note pain to her mid back when she takes a deep breath.  ROS: Positive for shortness of breath, leg swelling, leg pain, seductive fever Negative for syncope, weakness  Physical Exam:   Gen: No distress  Neuro: Awake and Alert  Skin: Warm    Focused Exam: Globally diminished breath sounds.  Speaking in full sentences, needs to take a deep breath when she is talking.  Heart rate and rhythm regular, 2+ DP/PT pulses and radial pulses bilaterally.  Calves measure 30 cm bilaterally around the widest part, ankles measure 20 cm bilaterally.  No pitting edema noted, no palpable cords.  Left calf is tender to palpation.  No erythema or induration noted  Initiation of care has begun. The patient has been counseled on the process, plan, and necessity for staying for the completion/evaluation, and the remainder of the medical screening examination    Renita Papa, PA-C 05/25/18 1534    Long, W[facility], MD 05/25/18 1626

## 2018-05-25 NOTE — Progress Notes (Signed)
*  Preliminary Results* Left lower extremity venous duplex completed. Left lower extremity is negative for deep vein thrombosis. There is no evidence of left Baker's cyst.  05/25/2018 4:49 PM  Jinny Blossom Dawna Part

## 2018-05-25 NOTE — Addendum Note (Signed)
Addended by: Harriett Sine D on: 05/25/2018 02:34 PM   Modules accepted: Orders

## 2018-05-25 NOTE — ED Triage Notes (Addendum)
Pt endorses foot pain radiating up the left leg x 5 days accompanied by swelling and some shob worse with exertion. Had recent surgery on same foot for tumor and plantar fascitis. Has bandage in place, no drainage. Sent here for blood clot R/O. Pedal pulse present.

## 2018-05-25 NOTE — Telephone Encounter (Signed)
Pt is having some aches and pains in surgical foot. She has an appointment with Dr. Milinda Pointer on 06/02/2018. Please give her a call back.

## 2018-05-25 NOTE — Addendum Note (Signed)
Addended by: Harriett Sine D on: 05/25/2018 02:12 PM   Modules accepted: Orders

## 2018-05-26 ENCOUNTER — Inpatient Hospital Stay (HOSPITAL_COMMUNITY): Admission: RE | Admit: 2018-05-26 | Payer: Medicaid Other | Source: Ambulatory Visit

## 2018-05-26 ENCOUNTER — Other Ambulatory Visit: Payer: Self-pay

## 2018-05-26 MED FILL — AMLODIPINE BESYLATE 5 MG TA: 5 | 30 days supply | Qty: 30 | Fill #1

## 2018-05-26 NOTE — ED Notes (Signed)
Follow up call made  No answer  05/26/18  1104 s Chany Woolworth rn

## 2018-05-27 ENCOUNTER — Other Ambulatory Visit: Payer: Self-pay

## 2018-05-27 DIAGNOSIS — E039 Hypothyroidism, unspecified: Secondary | ICD-10-CM

## 2018-05-28 MED ORDER — LEVOTHYROXINE SODIUM 112 MCG PO TABS: 112.0000 ug | ORAL_TABLET | Freq: Every day | ORAL | 1 refills | Status: DC

## 2018-05-29 MED FILL — LEVOTHYROXINE 112 MCG TAB: 112 | 30 days supply | Qty: 30 | Fill #0

## 2018-06-02 ENCOUNTER — Ambulatory Visit: Payer: Medicaid Other | Admitting: Podiatry

## 2018-06-02 ENCOUNTER — Encounter: Payer: Self-pay | Admitting: Podiatry

## 2018-06-02 DIAGNOSIS — M722 Plantar fascial fibromatosis: Secondary | ICD-10-CM

## 2018-06-02 MED ORDER — HYDROCODONE-ACETAMINOPHEN 5-325 MG PO TABS: 1.0000 | ORAL_TABLET | Freq: Four times a day (QID) | ORAL | 0 refills | Status: DC | PRN

## 2018-06-03 NOTE — Progress Notes (Signed)
She presents today continue to wear the boot to her left lower extremity.  States that her foot is still sore she is status post excision plantar fibroma left foot.  She denies fever chills nausea vomiting muscle aches and pains.  States that she has had some leg and calf pain she went to the emergency department and was demonstrated not to have a DVT.  Is also complaining of abdominal pain that she points to her bladder area.  Objective: Vital signs are stable she is alert and oriented x3 the foot itself appears to be healing quite nicely there is some eschar and scab still present that should go ahead and resolve over the next little while I expressed to her that she needs to watch this and massage that she understands is amenable to it encourage range of motion exercises.  Assessment: Well-healing surgical foot.  Plan: Patient requested pain medication just Percocet declined the use of Percocet because we were given her over a months worth I am since her surgery at this point we will give her Vicodin which she states that she is allergic to though it is not demonstrated in any of the notes in the chart that she is allergic to Vicodin.  She states that she would rather have the Percocet I explained to her that we are not able to do that.  I will follow-up with her in 2 to 4 weeks.

## 2018-06-16 ENCOUNTER — Ambulatory Visit (INDEPENDENT_AMBULATORY_CARE_PROVIDER_SITE_OTHER): Payer: Medicaid Other | Admitting: Podiatry

## 2018-06-16 DIAGNOSIS — Z9889 Other specified postprocedural states: Secondary | ICD-10-CM

## 2018-06-16 DIAGNOSIS — M722 Plantar fascial fibromatosis: Secondary | ICD-10-CM | POA: Diagnosis not present

## 2018-06-16 MED ORDER — HYDROCODONE-ACETAMINOPHEN 5-325 MG PO TABS: 1.0000 | ORAL_TABLET | Freq: Four times a day (QID) | ORAL | 0 refills | Status: DC | PRN

## 2018-06-17 NOTE — Progress Notes (Signed)
She presents today for follow-up of her excision plantar fibroma.  States that is still sore.  She is wrapping it with Ace bandage ambulating with a cane and her cam walker.  Objective: Vital signs are stable she is alert and oriented x3 incision site appears to be healing very nicely mild tenderness on palpation.  No erythema cellulitis drainage or odor no edema.  Assessment: Slowly healing surgical site plantar aspect left foot.  Plan: Going to recommend that she start walking with a regular shoe wear and another prescription for Vicodin we will follow-up with her in 2 to 3 weeks

## 2018-06-22 ENCOUNTER — Telehealth: Payer: Self-pay | Admitting: *Deleted

## 2018-06-22 NOTE — Telephone Encounter (Signed)
Pt's home phone rang over 1 minute without answering service. Left message on pt's mobile phone stating I had received a call from [facility], that Dr. Stephenie Acres provider number was not valid for her insurance and if she would bring the rx to the office I would order the medication under another provider.

## 2018-06-22 NOTE — Telephone Encounter (Signed)
[facility] states pt's pain medication will not be covered by the Medicaid, received notice stating prescriber is not a valid Medicaid provider.

## 2018-06-23 ENCOUNTER — Telehealth: Payer: Self-pay | Admitting: *Deleted

## 2018-06-23 MED ORDER — HYDROCODONE-ACETAMINOPHEN 5-325 MG PO TABS: 1.0000 | ORAL_TABLET | Freq: Four times a day (QID) | ORAL | 0 refills | Status: DC | PRN

## 2018-06-23 NOTE — Telephone Encounter (Signed)
I informed pt, that if she would like she could pick up another rx for the norco.

## 2018-06-23 NOTE — Telephone Encounter (Signed)
[name] states pt's norco can not be filled due to Dr. Stephenie Acres provider number is not valid for Medicaid. I told [name] to shred the 06/16/2018 Norco.

## 2018-06-24 MED FILL — LEVOTHYROXINE 112 MCG TAB: 112 | 30 days supply | Qty: 30 | Fill #1

## 2018-06-24 MED FILL — ATORVASTATIN 10 MG TABLET: 10 | 30 days supply | Qty: 30 | Fill #4

## 2018-06-24 MED FILL — AMLODIPINE BESYLATE 5 MG TA: 5 | 30 days supply | Qty: 30 | Fill #2

## 2018-06-30 ENCOUNTER — Ambulatory Visit: Payer: Medicaid Other | Attending: Internal Medicine | Admitting: Internal Medicine

## 2018-06-30 ENCOUNTER — Encounter: Payer: Self-pay | Admitting: Internal Medicine

## 2018-06-30 VITALS — BP 136/85 | HR 77 | Temp 98.2°F | Resp 16 | Wt 161.6 lb

## 2018-06-30 DIAGNOSIS — K219 Gastro-esophageal reflux disease without esophagitis: Secondary | ICD-10-CM | POA: Insufficient documentation

## 2018-06-30 DIAGNOSIS — M79672 Pain in left foot: Secondary | ICD-10-CM

## 2018-06-30 DIAGNOSIS — Z7982 Long term (current) use of aspirin: Secondary | ICD-10-CM | POA: Insufficient documentation

## 2018-06-30 DIAGNOSIS — Z79899 Other long term (current) drug therapy: Secondary | ICD-10-CM | POA: Diagnosis not present

## 2018-06-30 DIAGNOSIS — Z888 Allergy status to other drugs, medicaments and biological substances status: Secondary | ICD-10-CM | POA: Insufficient documentation

## 2018-06-30 DIAGNOSIS — Z8673 Personal history of transient ischemic attack (TIA), and cerebral infarction without residual deficits: Secondary | ICD-10-CM | POA: Insufficient documentation

## 2018-06-30 DIAGNOSIS — E559 Vitamin D deficiency, unspecified: Secondary | ICD-10-CM | POA: Diagnosis not present

## 2018-06-30 DIAGNOSIS — Z823 Family history of stroke: Secondary | ICD-10-CM | POA: Insufficient documentation

## 2018-06-30 DIAGNOSIS — Z90710 Acquired absence of both cervix and uterus: Secondary | ICD-10-CM | POA: Diagnosis not present

## 2018-06-30 DIAGNOSIS — Z79891 Long term (current) use of opiate analgesic: Secondary | ICD-10-CM | POA: Insufficient documentation

## 2018-06-30 DIAGNOSIS — F17210 Nicotine dependence, cigarettes, uncomplicated: Secondary | ICD-10-CM | POA: Insufficient documentation

## 2018-06-30 DIAGNOSIS — Z85850 Personal history of malignant neoplasm of thyroid: Secondary | ICD-10-CM | POA: Diagnosis not present

## 2018-06-30 DIAGNOSIS — E785 Hyperlipidemia, unspecified: Secondary | ICD-10-CM | POA: Diagnosis not present

## 2018-06-30 DIAGNOSIS — Z809 Family history of malignant neoplasm, unspecified: Secondary | ICD-10-CM | POA: Insufficient documentation

## 2018-06-30 DIAGNOSIS — Z9889 Other specified postprocedural states: Secondary | ICD-10-CM | POA: Insufficient documentation

## 2018-06-30 DIAGNOSIS — F17200 Nicotine dependence, unspecified, uncomplicated: Secondary | ICD-10-CM

## 2018-06-30 DIAGNOSIS — E039 Hypothyroidism, unspecified: Secondary | ICD-10-CM | POA: Insufficient documentation

## 2018-06-30 DIAGNOSIS — I10 Essential (primary) hypertension: Secondary | ICD-10-CM | POA: Diagnosis not present

## 2018-06-30 MED ORDER — NICOTINE POLACRILEX 2 MG MT GUM: CHEWING_GUM | OROMUCOSAL | 1 refills | Status: DC

## 2018-06-30 MED ORDER — VITAMIN D2 10 MCG (400 UNIT) PO TABS: ORAL_TABLET | ORAL | 2 refills | Status: DC

## 2018-06-30 MED FILL — SM NICOTINE 2 MG CHEWING GU: 2 | 30 days supply | Qty: 100 | Fill #0

## 2018-06-30 NOTE — Progress Notes (Signed)
Patient ID: Theresa Watkins, female    DOB: [birth year]  MRN: [id number]  CC: Follow-up   Subjective: Theresa Watkins is a 52 y.o. female who presents for chronic ds management.  Son is with her Her concerns today include:  Patient with history of HTN, HL, CVA (LT basal ganglia hemorrhagic infarct 12/2015), hypothyroidism (hx of thyroid CA 2011), GERD, anxious mood, tob dep  LT foot pain: since last visit she had surgery to excise plantar fibroma from  sole of LT foot  04/17/2018 by podiatrist Dr. Milinda Pointer. Healing has been slow.  She c/o sharp pains on sole.  She is concern that the area may be infected.  She has been soaking the foot in warm water.  She also c/o about soreness of skin over rounded heal that was first noted after she come out of surgery on 04/17/2018.  She states that the skin was black.  Last saw her podiatrist about 2 wks ago  HTN:   Did not take Norvasc as yet for today but reports compliance  Thyroid:  Taking Levothyroxine as prescribed  Vit D:  Level was 23 on last visit.  Taking Vit D supplement as recommended  tob dep: Still smoking.   1 pk/last 5 days.   Trying to quit Patient Active Problem List   Diagnosis Date Noted  . Vitamin D deficiency 02/18/2018  . Toe deformity, acquired, right 02/18/2018  . 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  . Hyperlipemia 03/21/2017  . Headache 03/21/2017  . Tooth pain   . CVA (cerebral infarction) 12/27/2015  . HLD (hyperlipidemia)   . Hemiparesis affecting dominant side as late effect of stroke ([place])   . Tobacco abuse   . Cocaine abuse ([place])   . ETOH abuse   . Hypothyroidism (acquired) 12/24/2015  . Drug abuse, cocaine type ([place]) 05/09/2014  . Hx of thyroid cancer 01/25/2014  . Essential hypertension 06/03/2012     Current Outpatient Medications on File Prior to Visit  Medication Sig Dispense Refill  . amLODipine (NORVASC) 5 MG tablet Take 1 tablet (5 mg total) by mouth daily. 30 tablet 2  . aspirin  325 MG tablet Take 325 mg by mouth daily.    . Calcium Carbonate-Vitamin D (CALCIUM + D PO) Take 2 tablets by mouth 2 (two) times daily.     . cetirizine (ZYRTEC) 10 MG tablet Take 1 tablet (10 mg total) by mouth daily. 30 tablet 6  . HYDROcodone-acetaminophen (NORCO/VICODIN) 5-325 MG tablet Take 1 tablet by mouth every 6 (six) hours as needed for moderate pain. 20 tablet 0  . hydrOXYzine (ATARAX/VISTARIL) 10 MG tablet Take 1 tablet (10 mg total) by mouth 3 (three) times daily as needed. 30 tablet 0  . levothyroxine (SYNTHROID, LEVOTHROID) 112 MCG tablet Take 1 tablet (112 mcg total) by mouth daily. 90 tablet 1  . ondansetron (ZOFRAN) 4 MG tablet Take 1 tablet (4 mg total) by mouth every 8 (eight) hours as needed for nausea or vomiting. 20 tablet 0  . oxyCODONE-acetaminophen (PERCOCET) 5-325 MG tablet Take 1 tablet by mouth every 8 (eight) hours as needed for severe pain. 20 tablet 0  . ranitidine (ZANTAC) 150 MG tablet Take 1 tablet (150 mg total) by mouth 2 (two) times daily. 60 tablet 0  . VITAMIN A PO Take 1 tablet by mouth daily.    . vitamin C (ASCORBIC ACID) 500 MG tablet Take 500 mg by mouth daily.     No current facility-administered medications on  file prior to visit.     Allergies  Allergen Reactions  . Gadolinium Derivatives Hives, Itching and Swelling    After MultiHance (gadolinium) injection, pt began sneezing.  After exam, pt told MR tech Burna Mortimer that she was itching.  Dr. Owens Shark evaluated pt, and noticed hives on pt's back.  Pt said that she felt swelling in her throat.  Dr. Owens Shark directed that pt be taken to hospital via ambulance.    . Iohexol Hives, Itching and Other (See Comments)     Code: HIVES, Desc: pts tongue began itching post injection and throat burning, Onset Date: [id number]   . Proanthocyanidin Swelling    Swelling of the tongue  . Grapeseed Extract [Nutritional Supplements]     Tongue swelll  . Diphenhydramine Hcl Rash  . Tramadol Rash    Social History    Socioeconomic History  . Marital status: Divorced    Spouse name: Not on file  . Number of children: Not on file  . Years of education: Not on file  . Highest education level: Not on file  Occupational History  . Not on file  Social Needs  . Financial resource strain: Not on file  . Food insecurity:    Worry: Not on file    Inability: Not on file  . Transportation needs:    Medical: Not on file    Non-medical: Not on file  Tobacco Use  . Smoking status: Light Tobacco Smoker    Packs/day: 0.10    Years: 25.00    Pack years: 2.50    Types: Cigarettes  . Smokeless tobacco: Never Used  . Tobacco comment: smoke 2 a day trying to quit  Substance and Sexual Activity  . Alcohol use: Yes    Alcohol/week: 0.0 standard drinks    Comment: occ  . Drug use: Yes    Types: Cocaine    Comment: 1-2x monthly  . Sexual activity: Not Currently    Birth control/protection: Surgical  Lifestyle  . Physical activity:    Days per week: Not on file    Minutes per session: Not on file  . Stress: Not on file  Relationships  . Social connections:    Talks on phone: Not on file    Gets together: Not on file    Attends religious service: Not on file    Active member of club or organization: Not on file    Attends meetings of clubs or organizations: Not on file    Relationship status: Not on file  . Intimate partner violence:    Fear of current or ex partner: Not on file    Emotionally abused: Not on file    Physically abused: Not on file    Forced sexual activity: Not on file  Other Topics Concern  . Not on file  Social History Narrative  . Not on file    Family History  Problem Relation Age of Onset  . Bell's palsy Mother   . Cancer Father   . Stroke Father   . Breast cancer Cousin     Past Surgical History:  Procedure Laterality Date  . ABDOMINAL HYSTERECTOMY    . CESAREAN SECTION    . goiter     removed  . laparoscopic  1988   removal  of ectopic preg, ruptured tube  .  THERAPEUTIC ABORTION    . THYROIDECTOMY  march 2011   cancer    ROS: Review of Systems Neg as stated above  PHYSICAL EXAM: BP  136/85   Pulse 77   Temp 98.2 F (36.8 C) (Oral)   Resp 16   Wt 161 lb 9.6 oz (73.3 kg)   SpO2 97%   BMI 26.08 kg/m   Physical Exam  General appearance - alert, well appearing, and in no distress Neck - supple, no significant adenopathy Chest - clear to auscultation, no wheezes, rales or rhonchi, symmetric air entry Heart - normal rate, regular rhythm, normal S1, S2, no murmurs, rubs, clicks or gallops Musculoskeletal - LT foot: long healed incision on sole of foot.  No surrounding edema, erythema or palpable fluctuance.  Not tender to touch Heel: no skin break down. Skin over heel is soft but not fluctuant Extremities - no Le edema      Results for orders placed or performed in visit on 06/30/18  CBC with Differential/Platelet  Result Value Ref Range   WBC 4.7 3.4 - 10.8 x10E3/uL   RBC 5.51 (H) 3.77 - 5.28 x10E6/uL   Hemoglobin 15.1 11.1 - 15.9 g/dL   Hematocrit 46.3 34.0 - 46.6 %   MCV 84 79 - 97 fL   MCH 27.4 26.6 - 33.0 pg   MCHC 32.6 31.5 - 35.7 g/dL   RDW 13.3 12.3 - 15.4 %   Platelets 206 150 - 450 x10E3/uL   Neutrophils 57 Not Estab. %   Lymphs 33 Not Estab. %   Monocytes 8 Not Estab. %   Eos 2 Not Estab. %   Basos 0 Not Estab. %   Neutrophils Absolute 2.7 1.4 - 7.0 x10E3/uL   Lymphocytes Absolute 1.5 0.7 - 3.1 x10E3/uL   Monocytes Absolute 0.4 0.1 - 0.9 x10E3/uL   EOS (ABSOLUTE) 0.1 0.0 - 0.4 x10E3/uL   Basophils Absolute 0.0 0.0 - 0.2 x10E3/uL   Immature Granulocytes 0 Not Estab. %   Immature Grans (Abs) 0.0 0.0 - 0.1 x10E3/uL  Lipid panel  Result Value Ref Range   Cholesterol, Total 180 100 - 199 mg/dL   Triglycerides 93 0 - 149 mg/dL   HDL 47 >39 mg/dL   VLDL Cholesterol Cal 19 5 - 40 mg/dL   LDL Calculated 114 (H) 0 - 99 mg/dL   Chol/HDL Ratio 3.8 0.0 - 4.4 ratio  Hepatic Function Panel  Result Value Ref Range    Total Protein 6.5 6.0 - 8.5 g/dL   Albumin 3.8 3.5 - 5.5 g/dL   Bilirubin Total 0.3 0.0 - 1.2 mg/dL   Bilirubin, Direct 0.08 0.00 - 0.40 mg/dL   Alkaline Phosphatase 77 39 - 117 IU/L   AST 13 0 - 40 IU/L   ALT 11 0 - 32 IU/L    ASSESSMENT AND PLAN: 1. Left foot pain -exam does not suggest infection.   Recommend following instructions given by podiatrist -area on heel suggest it may have been the beginning of skin break down from pressure on this area questionable during surgery.  Recommend putting foot on pillow at nights and trying to avoid putting prolong pressure on the area  2. Essential hypertension Close to goal Pt to take Norvasc when she returns home - CBC with Differential/Platelet - Lipid panel - Hepatic Function Panel  3. Tobacco dependence Advised to quit.  Pt request rxn for nicotine gum - nicotine polacrilex (RA NICOTINE GUM) 2 MG gum; Chew 1 stick gum PRN to decrease cravings.  Max 30 pieces/24 hrs  Dispense: 100 tablet; Refill: 1  4. Vitamin D deficiency - Ergocalciferol (VITAMIN D2) 400 units TABS; 1 tab PO daily  Dispense:  100 tablet; Refill: 2  Patient was given the opportunity to ask questions.  Patient verbalized understanding of the plan and was able to repeat key elements of the plan.   Orders Placed This Encounter  Procedures  . CBC with Differential/Platelet  . Lipid panel  . Hepatic Function Panel     Requested Prescriptions   Signed Prescriptions Disp Refills  . Ergocalciferol (VITAMIN D2) 400 units TABS 100 tablet 2    Sig: 1 tab PO daily  . nicotine polacrilex (RA NICOTINE GUM) 2 MG gum 100 tablet 1    Sig: Chew 1 stick gum PRN to decrease cravings.  Max 30 pieces/24 hrs    Return in about 4 months (around 10/30/2018).  Karle Plumber, MD, FACP

## 2018-07-01 ENCOUNTER — Telehealth: Payer: Self-pay

## 2018-07-01 DIAGNOSIS — E782 Mixed hyperlipidemia: Secondary | ICD-10-CM

## 2018-07-01 LAB — HEPATIC FUNCTION PANEL
ALK PHOS: 77 IU/L (ref 39–117)
ALT: 11 IU/L (ref 0–32)
AST: 13 IU/L (ref 0–40)
Albumin: 3.8 g/dL (ref 3.5–5.5)
BILIRUBIN, DIRECT: 0.08 mg/dL (ref 0.00–0.40)
Bilirubin Total: 0.3 mg/dL (ref 0.0–1.2)
Total Protein: 6.5 g/dL (ref 6.0–8.5)

## 2018-07-01 LAB — CBC WITH DIFFERENTIAL/PLATELET
Basophils Absolute: 0 10*3/uL (ref 0.0–0.2)
Basos: 0 %
EOS (ABSOLUTE): 0.1 10*3/uL (ref 0.0–0.4)
EOS: 2 %
HEMATOCRIT: 46.3 % (ref 34.0–46.6)
Hemoglobin: 15.1 g/dL (ref 11.1–15.9)
Immature Grans (Abs): 0 10*3/uL (ref 0.0–0.1)
Immature Granulocytes: 0 %
LYMPHS: 33 %
Lymphocytes Absolute: 1.5 10*3/uL (ref 0.7–3.1)
MCH: 27.4 pg (ref 26.6–33.0)
MCHC: 32.6 g/dL (ref 31.5–35.7)
MCV: 84 fL (ref 79–97)
MONOCYTES: 8 %
MONOS ABS: 0.4 10*3/uL (ref 0.1–0.9)
Neutrophils Absolute: 2.7 10*3/uL (ref 1.4–7.0)
Neutrophils: 57 %
Platelets: 206 10*3/uL (ref 150–450)
RBC: 5.51 x10E6/uL — AB (ref 3.77–5.28)
RDW: 13.3 % (ref 12.3–15.4)
WBC: 4.7 10*3/uL (ref 3.4–10.8)

## 2018-07-01 LAB — LIPID PANEL
CHOL/HDL RATIO: 3.8 ratio (ref 0.0–4.4)
Cholesterol, Total: 180 mg/dL (ref 100–199)
HDL: 47 mg/dL (ref >39)
LDL Calculated: 114 mg/dL — ABNORMAL HIGH (ref 0–99)
TRIGLYCERIDES: 93 mg/dL (ref 0–149)
VLDL Cholesterol Cal: 19 mg/dL (ref 5–40)

## 2018-07-01 MED ORDER — ATORVASTATIN CALCIUM 20 MG PO TABS: 20.0000 mg | ORAL_TABLET | Freq: Every day | ORAL | 4 refills | Status: DC

## 2018-07-01 NOTE — Telephone Encounter (Signed)
Contacted pt to go over lab results pt is aware and doesn't have any questions or concerns  Dr. Wynetta Emery pt state she is taking the liptor every night. Pt states she will double up on the 10mg  and when she runs out she will pick up the nex rx and she would like the rx sent to [facility] pharmacy

## 2018-07-01 NOTE — Addendum Note (Signed)
Addended by: Karle Plumber B on: 07/01/2018 06:22 PM   Modules accepted: Orders

## 2018-07-07 ENCOUNTER — Ambulatory Visit (INDEPENDENT_AMBULATORY_CARE_PROVIDER_SITE_OTHER): Payer: Medicaid Other | Admitting: Podiatry

## 2018-07-07 ENCOUNTER — Encounter: Payer: Self-pay | Admitting: Podiatry

## 2018-07-07 DIAGNOSIS — M722 Plantar fascial fibromatosis: Secondary | ICD-10-CM

## 2018-07-07 DIAGNOSIS — Z9889 Other specified postprocedural states: Secondary | ICD-10-CM

## 2018-07-07 MED ORDER — HYDROCODONE-ACETAMINOPHEN 10-325 MG PO TABS: 1.0000 | ORAL_TABLET | Freq: Four times a day (QID) | ORAL | 0 refills | Status: AC | PRN

## 2018-07-08 ENCOUNTER — Telehealth: Payer: Self-pay | Admitting: *Deleted

## 2018-07-08 DIAGNOSIS — M722 Plantar fascial fibromatosis: Secondary | ICD-10-CM

## 2018-07-08 DIAGNOSIS — Z9889 Other specified postprocedural states: Secondary | ICD-10-CM

## 2018-07-08 NOTE — Telephone Encounter (Signed)
-----   Message from Rip Harbour, [facility] sent at 07/07/2018  4:33 PM EDT ----- Regarding: PT PT referral - [facility]   S/p excision plantar fibroma left  Duration: 3 x week x 4 weeks

## 2018-07-08 NOTE — Progress Notes (Signed)
She presents today for follow-up of her plantar fibroma of her left foot states that is still really numb really sore she says I am upset about this I am not happy the incision is painful and cramps a lot.  Date of surgery was Apr 17, 2018 excision plantar fibroma  Objective: She walks in today unassisted.  Evaluation of the foot demonstrate strong palpable pulses left incision site is healing very nicely it is non-hypertrophic she states that how painful it is while she is looking at it and touching it however when she is engaged in conversation and I massage the area it is does not seem to be painful she does not demonstrate any spasms while I am with her.  No signs of infection appears to be healing normally.  Assessment: Well-healing surgical excision of plantar fibroma left.  Plan: Wrote her another prescription for Vicodin recommended that she follow-up with me in about 2 weeks but I am going to send her to physical therapy.

## 2018-07-08 NOTE — Telephone Encounter (Signed)
Pt states she goes to [facility] in the [facility]. I told pt I had their form and would make the referral. Required form, clinicals and demographics [facility]ed [facility]

## 2018-07-08 NOTE — Telephone Encounter (Signed)
Left message on pt's home phone to call with contact information for [facility].

## 2018-07-23 ENCOUNTER — Telehealth: Payer: Self-pay | Admitting: Podiatry

## 2018-07-23 NOTE — Telephone Encounter (Signed)
She just wants narcotic.  You can refill her vicodin but not percocet.

## 2018-07-23 NOTE — Telephone Encounter (Signed)
Pt is requesting pain meds 

## 2018-07-23 NOTE — Telephone Encounter (Signed)
Pt states she would like pain medication due to a surgery foot injury. Request for pain medications sent to Dr. Milinda Pointer.

## 2018-07-23 NOTE — Telephone Encounter (Signed)
I had a fall the day before yesterday and my foot came out of my shoe. I cannot feel my foot and stepped barefoot on my foot where the incision is at on the bottom. I was wondering if Dr. Milinda Pointer could call me in something. [place] has medications in the computer but I didn't know if Dr. Milinda Pointer could call something in or if they could call him. Thank you. Bye.

## 2018-07-24 ENCOUNTER — Ambulatory Visit: Payer: Medicaid Other | Admitting: Podiatry

## 2018-07-24 ENCOUNTER — Telehealth: Payer: Self-pay | Admitting: Podiatry

## 2018-07-24 ENCOUNTER — Encounter: Payer: Medicaid Other | Admitting: Podiatry

## 2018-07-24 MED ORDER — HYDROCODONE-ACETAMINOPHEN 5-325 MG PO TABS: 1.0000 | ORAL_TABLET | Freq: Four times a day (QID) | ORAL | 0 refills | Status: DC | PRN

## 2018-07-24 NOTE — Telephone Encounter (Signed)
I informed pt Dr. Marta Antu the refill of the hydrocodone, and she would need to pick up the rx in the [place] office, and while she was at the [facility] office, she could be evaluated by a doctor. Pt statesshe may only be able to get someone to pick up the rx. I told pt she would benefit from being evaluated by a doctor, that pain medication only masked a problem.

## 2018-07-24 NOTE — Telephone Encounter (Signed)
I called yesterday to see if Dr. Milinda Pointer would prescribe me something for pain. I was given an emergency appointment with one of the other doctors at 10:45 am today. I cannot find a ride to get to that appointment. So I'll either go to the hospital or try to contact you guys to see if I can make it for Monday. Alright thank you. Bye.

## 2018-07-25 NOTE — Telephone Encounter (Signed)
Thought we gave her some last week.  If she is in that much pain she will need to go to the ED.

## 2018-07-29 ENCOUNTER — Other Ambulatory Visit: Payer: Self-pay | Admitting: Internal Medicine

## 2018-07-29 DIAGNOSIS — I10 Essential (primary) hypertension: Secondary | ICD-10-CM

## 2018-08-04 ENCOUNTER — Ambulatory Visit (INDEPENDENT_AMBULATORY_CARE_PROVIDER_SITE_OTHER): Payer: Medicaid Other | Admitting: Podiatry

## 2018-08-04 ENCOUNTER — Encounter: Payer: Medicaid Other | Admitting: Podiatry

## 2018-08-04 ENCOUNTER — Encounter: Payer: Self-pay | Admitting: Podiatry

## 2018-08-04 DIAGNOSIS — M722 Plantar fascial fibromatosis: Secondary | ICD-10-CM | POA: Diagnosis not present

## 2018-08-04 DIAGNOSIS — Z9889 Other specified postprocedural states: Secondary | ICD-10-CM | POA: Diagnosis not present

## 2018-08-05 ENCOUNTER — Telehealth: Payer: Self-pay | Admitting: *Deleted

## 2018-08-05 DIAGNOSIS — Z9889 Other specified postprocedural states: Secondary | ICD-10-CM

## 2018-08-05 DIAGNOSIS — M722 Plantar fascial fibromatosis: Secondary | ICD-10-CM

## 2018-08-05 NOTE — Telephone Encounter (Signed)
Find a PT group that takes Medicaid please.  You can inform the patient as I have that she is not under the 90 day global for [facility] mgt but you can look for a pain mgt group out side of the city if necessary.

## 2018-08-05 NOTE — Progress Notes (Signed)
She presents today for postop visit regarding excision of plantar fibroma of the left foot.  States that it hurts so bad and I need more pain medicine to.  I asked her if she went to physical therapy and she says no one ever called her.  Objective: Vital signs are stable she is alert and oriented x3.  Pulses are palpable.  She has no erythema edema cellulitis drainage or odor when I initially palpate the incision site to the plantar aspect of the left foot she states that this is severely painful and withdrawals however once having gauge during conversation evaluating the l incision once again she does not withdrawal and there seems to be no pain.  The incision site is gone on to heal uneventfully the scar is healing nicely it is flat.  No fibromas are palpable.  Assessment: Well-healing surgical site left chronic malingerer.  Plan: Because of the pain that she is stating that she is having on the left foot I will at this time we order physical therapy to call her and set up an appointment.  We will also consider a pain clinic though I did discuss with her in great detail that a pain clinic may not take her because of her history of narcotic abuse.  She understands this is amenable to it I explained to her that she will no longer get pain medication from [place].  She understands that and is amenable to it.

## 2018-08-05 NOTE — Telephone Encounter (Signed)
-----   Message from Rip Harbour, [facility] sent at 08/04/2018  5:08 PM EDT ----- Regarding: PT and pain clinic [facility]in house   S/p plantar fibroma left   Pain Clinic - for same reason

## 2018-08-05 NOTE — Telephone Encounter (Signed)
Faxed referral, and demographics to [facility]. Faxed required form, clinicals and demographics to [facility]. Pt is out of 90 day global surgery period, referred to [facility] agencies.

## 2018-08-07 ENCOUNTER — Other Ambulatory Visit: Payer: Self-pay

## 2018-08-19 ENCOUNTER — Ambulatory Visit: Payer: Medicaid Other | Admitting: Physical Therapy

## 2018-08-25 ENCOUNTER — Encounter: Payer: Self-pay | Admitting: Physical Therapy

## 2018-08-25 ENCOUNTER — Ambulatory Visit: Payer: Medicaid Other | Attending: Podiatry | Admitting: Physical Therapy

## 2018-08-25 ENCOUNTER — Other Ambulatory Visit: Payer: Self-pay

## 2018-08-25 DIAGNOSIS — M79672 Pain in left foot: Secondary | ICD-10-CM | POA: Insufficient documentation

## 2018-08-25 DIAGNOSIS — Z9889 Other specified postprocedural states: Secondary | ICD-10-CM | POA: Diagnosis not present

## 2018-08-25 DIAGNOSIS — R262 Difficulty in walking, not elsewhere classified: Secondary | ICD-10-CM | POA: Insufficient documentation

## 2018-08-25 DIAGNOSIS — M6281 Muscle weakness (generalized): Secondary | ICD-10-CM | POA: Diagnosis not present

## 2018-08-25 NOTE — Therapy (Signed)
[place], [place], [address] Phone: [phone]   Fax:  [phone]  Physical Therapy Evaluation  Patient Details  Name: Theresa Watkins MRN: [id number] Date of Birth: 1966/05/20 Referring Provider (PT): [place], [place] T, [place]   Encounter Date: 08/25/2018  PT End of Session - 08/25/18 1643    Visit Number  1    Number of Visits  4    Authorization Type  MCD, waiting for auth    PT Start Time  1632    PT Stop Time  1710    PT Time Calculation (min)  38 min    Activity Tolerance  Patient tolerated treatment well    Behavior During Therapy  [facility] for tasks assessed/performed       Past Medical History:  Diagnosis Date  . Abnormal Pap smear   . Bronchitis   . Fibroid   . Headache(784.0)   . Ovarian cyst   . Plantar fasciitis   . Seizures ([place])   . Stroke ([place])   . Thyroid cancer ([place]) [address]  . Trichomonas   . Urinary tract infection     Past Surgical History:  Procedure Laterality Date  . ABDOMINAL HYSTERECTOMY    . CESAREAN SECTION    . goiter     removed  . laparoscopic  1988   removal  of ectopic preg, ruptured tube  . THERAPEUTIC ABORTION    . THYROIDECTOMY  march 2011   cancer    There were no vitals filed for this visit.   Subjective Assessment - 08/25/18 1644    Subjective  Approx 3 mo post op removal of plantar fascia fibroma and plantar fasica- per pt. I feel consant pain, numbness from head of 5th metatarsal and great toe. I feel like I have a piece of duck tape around forefoot. Sharp pains through foot. Incision gets tight at night time. Wearing surgical shoe full time without arch support. SPC as assistive device.     How long can you stand comfortably?  unable    Patient Stated Goals  decrease pain, walk in regular shoes    Currently in Pain?  Yes    Pain Score  9     Pain Location  Foot    Pain Orientation  Left    Pain Descriptors / Indicators  Tightness;Numbness;Tingling     Aggravating Factors   constant pain    Pain Relieving Factors  wearing a sock         [facility] PT Assessment - 08/25/18 0001      Assessment   Medical Diagnosis  s/p Lt foot surgery- removal of plantar fascia fibromatosis    Referring Provider (PT)  Cushing, Max T, DPM    Onset Date/Surgical Date  04/16/18    Hand Dominance  Right    Prior Therapy  no      Precautions   Precautions  None      Restrictions   Weight Bearing Restrictions  No      Balance Screen   Has the patient fallen in the past 6 months  Yes    How many times?  10+    Has the patient had a decrease in activity level because of a fear of falling?   Yes    Is the patient reluctant to leave their home because of a fear of falling?   No      Home Film/video editor residence  Living Arrangements  Alone    Additional Comments  no stairs at home      Prior Function   Level of Independence  Independent      Cognition   Overall Cognitive Status  Within Functional Limits for tasks assessed      Observation/Other Assessments   Focus on Therapeutic Outcomes (FOTO)   --   MCD     Sensation   Additional Comments  impaired in Lt foot      Posture/Postural Control   Posture Comments  pes planus bilat      ROM / Strength   AROM / PROM / Strength  AROM;Strength      AROM   Overall AROM Comments  WFL      Strength   Overall Strength Comments  foot strength gross 3/5      Ambulation/Gait   Gait Comments  SPC, antalgic, hip hike & increased knee flx in swing through to clear toe of surgical shoe                Objective measurements completed on examination: See above findings.              PT Education - 08/25/18 1800    Education Details  anatomy of condition, POC, HEP, exercise form/rationale    Person(s) Educated  Patient    Methods  Explanation;Demonstration;Tactile cues;Verbal cues;Handout    Comprehension  Verbalized understanding;Returned  demonstration;Verbal cues required;Tactile cues required;Need further instruction       PT Short Term Goals - 08/25/18 1800      PT SHORT TERM GOAL #1   Title  Pt will be independent in desensitization techniques at home    Baseline  began educating at eval    Time  3    Period  Weeks    Status  New    Target Date  09/18/18      PT SHORT TERM GOAL #2   Title  pt will demo proper form with HEP as it has been established with readiness for advancing of exercises    Baseline  began educating at eval    Time  3    Period  Weeks    Status  New    Target Date  09/18/18        PT Long Term Goals - 08/25/18 2036      PT LONG TERM GOAL #1   Title  Gross ankle strength 4+/5    Baseline  see flowsheet    Time  8    Period  Weeks    Status  New    Target Date  10/23/18      PT LONG TERM GOAL #2   Title  Average pain <=4/10    Baseline  pt reports up to "11/10" at eval    Time  8    Period  Weeks    Status  New    Target Date  10/23/18      PT LONG TERM GOAL #3   Title  Pt will demo proper gait pattern in shoes for at least 200 ft for improved safety in daily ambulation    Baseline  10+ falls since surgery, wearing surgical shoe    Time  8    Period  Weeks    Status  New    Target Date  10/23/18      PT LONG TERM GOAL #4   Title  Pt will be independent in long term HEP for  continued strengthening after discharge    Baseline  will progress and establish as appropriate.     Time  8    Period  Weeks    Status  New    Target Date  10/23/18             Plan - 08/25/18 1710    Clinical Impression Statement  Pt presents to PT with complaints of Lt foot pain s/p removal of fibroma and, pt reports, removal of plantar fascia- this is not noted in MD notes. Gross weakness noted in foot ankle with good ROM. Notable arch collapse bilaterally. Pt ambualtes with SPC but reports multiple falls recently due to surgery. Hypersensitivity noted along incision and forefoot. Pt will  benefit from skilled PT in order to improve gross strength, coordination and function of foot. Is not wearing any arch support at this time and I strongly encouraged her to obtain a pair of shoes with support and we discussed qualities to look for in a shoe.     History and Personal Factors relevant to plan of care:  h/o CVA x2, frequent falls, severe pain unchanged since surgery    Clinical Presentation  Unstable    Clinical Presentation due to:  10+ falls since surgery    Clinical Decision Making  High    Rehab Potential  Good    PT Frequency  --   3 visits in first auth   PT Treatment/Interventions  ADLs/Self Care Home Management;Cryotherapy;Electrical Stimulation;Gait training;Ultrasound;Moist Heat;Iontophoresis 4mg /ml Dexamethasone;Stair training;Functional mobility training;Therapeutic activities;Therapeutic exercise;Balance training;Neuromuscular re-education;Manual techniques;Patient/family education;Scar mobilization;Passive range of motion;Dry needling;Taping    PT Next Visit Plan  consider tape for arch support- did she get shoes? ankle tband strengthening    PT Home Exercise Plan  toe yoga, towel scrunches, desensitization, shoes    Consulted and Agree with Plan of Care  Patient       Patient will benefit from skilled therapeutic intervention in order to improve the following deficits and impairments:  Abnormal gait, Difficulty walking, Decreased activity tolerance, Pain, Improper body mechanics, Decreased balance, Decreased strength, Impaired sensation  Visit Diagnosis: Status post left foot surgery - Plan: PT plan of care cert/re-cert  Pain in left foot - Plan: PT plan of care cert/re-cert  Difficulty in walking, not elsewhere classified - Plan: PT plan of care cert/re-cert  Muscle weakness (generalized) - Plan: PT plan of care cert/re-cert     Problem List Patient Active Problem List   Diagnosis Date Noted  . Vitamin D deficiency 02/18/2018  . Toe deformity, acquired,  right 02/18/2018  . 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  . Hyperlipemia 03/21/2017  . Headache 03/21/2017  . Tooth pain   . CVA (cerebral infarction) 12/27/2015  . HLD (hyperlipidemia)   . Hemiparesis affecting dominant side as late effect of stroke ([place])   . Tobacco abuse   . Cocaine abuse ([place])   . ETOH abuse   . Hypothyroidism (acquired) 12/24/2015  . Drug abuse, cocaine type ([place]) 05/09/2014  . Hx of thyroid cancer 01/25/2014  . Essential hypertension 06/03/2012    Lennon Richins C. Cortni Tays PT, DPT 08/25/18 8:49 PM   [facility] [address] [place], [place], [address] Phone: [phone]   Fax:  [phone]  Name: Theresa Watkins MRN: [id number] Date of Birth: [birth year]

## 2018-08-25 NOTE — Patient Instructions (Signed)
   Desensitization: temperature, texture, pressure

## 2018-09-04 ENCOUNTER — Encounter

## 2018-09-10 ENCOUNTER — Ambulatory Visit: Payer: Medicaid Other | Admitting: Physical Therapy

## 2018-09-10 MED FILL — LEVOTHYROXINE 112 MCG TAB: 112 | 30 days supply | Qty: 30 | Fill #2 | Status: TO

## 2018-09-10 MED FILL — ATORVASTATIN 20 MG TABLET: 20 | 30 days supply | Qty: 30 | Fill #0

## 2018-10-23 ENCOUNTER — Other Ambulatory Visit: Payer: Self-pay

## 2018-10-23 DIAGNOSIS — E782 Mixed hyperlipidemia: Secondary | ICD-10-CM

## 2018-10-23 MED ORDER — ATORVASTATIN CALCIUM 20 MG PO TABS: 20.0000 mg | ORAL_TABLET | Freq: Every day | ORAL | 2 refills | Status: DC

## 2018-10-27 ENCOUNTER — Other Ambulatory Visit: Payer: Self-pay | Admitting: Internal Medicine

## 2018-10-27 DIAGNOSIS — I10 Essential (primary) hypertension: Secondary | ICD-10-CM

## 2018-10-29 ENCOUNTER — Ambulatory Visit: Payer: Medicaid Other | Admitting: Internal Medicine

## 2018-12-17 ENCOUNTER — Other Ambulatory Visit: Payer: Self-pay | Admitting: Internal Medicine

## 2018-12-17 DIAGNOSIS — I10 Essential (primary) hypertension: Secondary | ICD-10-CM

## 2018-12-25 ENCOUNTER — Other Ambulatory Visit: Payer: Self-pay | Admitting: Internal Medicine

## 2018-12-25 DIAGNOSIS — I10 Essential (primary) hypertension: Secondary | ICD-10-CM

## 2019-01-18 ENCOUNTER — Other Ambulatory Visit: Payer: Self-pay | Admitting: Internal Medicine

## 2019-01-18 DIAGNOSIS — I10 Essential (primary) hypertension: Secondary | ICD-10-CM

## 2019-01-28 ENCOUNTER — Other Ambulatory Visit: Payer: Self-pay | Admitting: Internal Medicine

## 2019-01-28 DIAGNOSIS — E039 Hypothyroidism, unspecified: Secondary | ICD-10-CM

## 2019-02-09 ENCOUNTER — Other Ambulatory Visit: Payer: Self-pay | Admitting: Internal Medicine

## 2019-02-09 DIAGNOSIS — E039 Hypothyroidism, unspecified: Secondary | ICD-10-CM

## 2019-02-09 NOTE — Telephone Encounter (Signed)
1) Medication(s) Requested (by name): levothyroxine (SYNTHROID, LEVOTHROID) 112 MCG tablet hydrOXYzine (ATARAX/VISTARIL) 10 MG tablet HYDROcodone-acetaminophen (NORCO/VICODIN) 5-325 MG tablet   2) Pharmacy of Choice: [place], [address]  3) Special Requests:   Approved medications will be sent to the pharmacy, we will reach out if there is an issue.  Requests made after 3pm may not be addressed until the following business day!  If a patient is unsure of the name of the medication(s) please note and ask patient to call back when they are able to provide all info, do not send to responsible party until all information is available!

## 2019-02-09 NOTE — Telephone Encounter (Signed)
Norco and hydroxyzine written by provider other than PCP. She was last seen in clinic in 06/2018 - Will defer refill of levothyroxine to PCP.

## 2019-02-12 MED ORDER — LEVOTHYROXINE SODIUM 112 MCG PO TABS: 112.0000 ug | ORAL_TABLET | Freq: Every day | ORAL | 1 refills | Status: DC

## 2019-03-15 ENCOUNTER — Other Ambulatory Visit: Payer: Self-pay | Admitting: Internal Medicine

## 2019-03-15 DIAGNOSIS — E782 Mixed hyperlipidemia: Secondary | ICD-10-CM

## 2019-03-15 DIAGNOSIS — I10 Essential (primary) hypertension: Secondary | ICD-10-CM

## 2019-04-02 ENCOUNTER — Telehealth: Payer: Self-pay | Admitting: Internal Medicine

## 2019-04-02 ENCOUNTER — Other Ambulatory Visit: Payer: Self-pay | Admitting: *Deleted

## 2019-04-02 DIAGNOSIS — E039 Hypothyroidism, unspecified: Secondary | ICD-10-CM

## 2019-04-02 DIAGNOSIS — E782 Mixed hyperlipidemia: Secondary | ICD-10-CM

## 2019-04-02 MED ORDER — LEVOTHYROXINE SODIUM 112 MCG PO TABS: 112.0000 ug | ORAL_TABLET | Freq: Every day | ORAL | 1 refills | Status: DC

## 2019-04-02 MED ORDER — ATORVASTATIN CALCIUM 20 MG PO TABS: 20.0000 mg | ORAL_TABLET | Freq: Every day | ORAL | 3 refills | Status: DC

## 2019-04-02 MED FILL — ATORVASTATIN 20 MG TABLET: 20 | 30 days supply | Qty: 30 | Fill #1

## 2019-04-02 NOTE — Telephone Encounter (Signed)
Refills sent to [facility] for atorvastatin and thyroid medication. She needs an OV for pain medications and to address the vitamin D. Pt verbalized understanding.

## 2019-04-02 NOTE — Telephone Encounter (Signed)
1) Medication(s) Requested (by name): HYDROcodone-acetaminophen  levothyroxine  atorvastatin (LIPITOR) 20 MG tablet  Vitamin A&D  2) Pharmacy of Choice: [facility]

## 2019-04-05 ENCOUNTER — Other Ambulatory Visit: Payer: Self-pay

## 2019-04-05 ENCOUNTER — Ambulatory Visit: Payer: Medicaid Other | Attending: Internal Medicine | Admitting: Internal Medicine

## 2019-04-05 ENCOUNTER — Other Ambulatory Visit: Payer: Self-pay | Admitting: Internal Medicine

## 2019-04-05 DIAGNOSIS — E039 Hypothyroidism, unspecified: Secondary | ICD-10-CM

## 2019-04-05 DIAGNOSIS — E559 Vitamin D deficiency, unspecified: Secondary | ICD-10-CM

## 2019-04-05 DIAGNOSIS — E782 Mixed hyperlipidemia: Secondary | ICD-10-CM

## 2019-04-05 DIAGNOSIS — I10 Essential (primary) hypertension: Secondary | ICD-10-CM

## 2019-04-05 NOTE — Progress Notes (Signed)
Pt states she is also having b/l hip pain  Pt state she be feeling like she is having flashes   Pt states her headaches are constant now

## 2019-04-05 NOTE — Progress Notes (Signed)
Contacted pt today for her televisit.  Pt indicated that if we were going to bill Medicaid for this visit, she prefers for it to be in person.  Message sent to schedulers to give in-person visit for tomorrow.

## 2019-04-06 ENCOUNTER — Other Ambulatory Visit: Payer: Self-pay | Admitting: Internal Medicine

## 2019-04-06 ENCOUNTER — Telehealth: Payer: Self-pay | Admitting: Internal Medicine

## 2019-04-06 LAB — VITAMIN D 25 HYDROXY (VIT D DEFICIENCY, FRACTURES): Vit D, 25-Hydroxy: 15.9 ng/mL — ABNORMAL LOW (ref 30.0–100.0)

## 2019-04-06 LAB — TSH: TSH: 2.02 u[IU]/mL (ref 0.450–4.500)

## 2019-04-06 MED ORDER — VITAMIN D (ERGOCALCIFEROL) 1.25 MG (50000 UNIT) PO CAPS: 50000.0000 [IU] | ORAL_CAPSULE | ORAL | 0 refills | Status: DC

## 2019-04-06 MED FILL — VIT D2 1.25 MG (50,000 UNIT: 1.25 MG | 28 days supply | Qty: 4 | Fill #0

## 2019-04-06 NOTE — Telephone Encounter (Signed)
Contacted pt to go over lab results pt is aware and doesn't have any questions or concerns 

## 2019-04-06 NOTE — Telephone Encounter (Signed)
Patient called to get their lab results. Please follow up.  °

## 2019-04-09 ENCOUNTER — Other Ambulatory Visit: Payer: Self-pay

## 2019-04-09 ENCOUNTER — Encounter: Payer: Self-pay | Admitting: Internal Medicine

## 2019-04-09 ENCOUNTER — Other Ambulatory Visit (HOSPITAL_COMMUNITY)
Admission: RE | Admit: 2019-04-09 | Discharge: 2019-04-09 | Disposition: A | Discharge status: Home / Self Care | Payer: Medicaid Other | Source: Ambulatory Visit | Attending: Internal Medicine | Admitting: Internal Medicine

## 2019-04-09 ENCOUNTER — Ambulatory Visit: Payer: Medicaid Other | Attending: Internal Medicine | Admitting: Internal Medicine

## 2019-04-09 VITALS — BP 105/76 | HR 78 | Temp 98.6°F | Resp 18 | Ht 65.5 in | Wt 162.0 lb

## 2019-04-09 DIAGNOSIS — I10 Essential (primary) hypertension: Secondary | ICD-10-CM | POA: Diagnosis present

## 2019-04-09 DIAGNOSIS — E785 Hyperlipidemia, unspecified: Secondary | ICD-10-CM

## 2019-04-09 DIAGNOSIS — M791 Myalgia, unspecified site: Secondary | ICD-10-CM

## 2019-04-09 DIAGNOSIS — F17200 Nicotine dependence, unspecified, uncomplicated: Secondary | ICD-10-CM

## 2019-04-09 DIAGNOSIS — Z79899 Other long term (current) drug therapy: Secondary | ICD-10-CM | POA: Diagnosis not present

## 2019-04-09 DIAGNOSIS — N760 Acute vaginitis: Secondary | ICD-10-CM

## 2019-04-09 DIAGNOSIS — E890 Postprocedural hypothyroidism: Secondary | ICD-10-CM | POA: Diagnosis not present

## 2019-04-09 DIAGNOSIS — F17210 Nicotine dependence, cigarettes, uncomplicated: Secondary | ICD-10-CM | POA: Diagnosis not present

## 2019-04-09 DIAGNOSIS — E559 Vitamin D deficiency, unspecified: Secondary | ICD-10-CM

## 2019-04-09 DIAGNOSIS — M19011 Primary osteoarthritis, right shoulder: Secondary | ICD-10-CM | POA: Diagnosis not present

## 2019-04-09 DIAGNOSIS — Z7982 Long term (current) use of aspirin: Secondary | ICD-10-CM | POA: Insufficient documentation

## 2019-04-09 DIAGNOSIS — Z85850 Personal history of malignant neoplasm of thyroid: Secondary | ICD-10-CM

## 2019-04-09 DIAGNOSIS — I69359 Hemiplegia and hemiparesis following cerebral infarction affecting unspecified side: Secondary | ICD-10-CM | POA: Diagnosis not present

## 2019-04-09 MED ORDER — DULOXETINE HCL 20 MG PO CPEP: 20.0000 mg | ORAL_CAPSULE | Freq: Every day | ORAL | 3 refills | Status: DC

## 2019-04-09 MED ORDER — CYCLOBENZAPRINE HCL 5 MG PO TABS: 5.0000 mg | ORAL_TABLET | Freq: Every day | ORAL | 1 refills | Status: DC | PRN

## 2019-04-09 MED ORDER — BLOOD PRESSURE MONITOR DEVI: 0 refills | Status: DC

## 2019-04-09 MED ORDER — LEVOTHYROXINE SODIUM 112 MCG PO TABS: 112.0000 ug | ORAL_TABLET | Freq: Every day | ORAL | 1 refills | Status: DC

## 2019-04-09 MED ORDER — VITAMIN D (ERGOCALCIFEROL) 1.25 MG (50000 UNIT) PO CAPS: 50000.0000 [IU] | ORAL_CAPSULE | ORAL | 0 refills | Status: DC

## 2019-04-09 MED FILL — CYCLOBENZAPRINE 5 MG TABLET: 5 | 30 days supply | Qty: 30 | Fill #0

## 2019-04-09 MED FILL — DULoxetine HCL 20 MG CPEP: 20 | 30 days supply | Qty: 30 | Fill #0

## 2019-04-09 NOTE — Patient Instructions (Signed)
I have prescribed a muscle relaxant called Flexeril for you to use as needed for the muscle ache in the thigh.  It can cause some drowsiness.  Take only when needed.  Try changing Lipitor to taking 20 mg every other day to see if the muscle ache improves.  If it does not please let me know.  I encourage you to set a quit date to quit smoking.  I referred you to the endocrinologist due to your history of thyroid cancer.   Start the medication Cymbalta which you will take once a day.  It is for your arthritis pain in the shoulder.   Discontinue Zantac.  You can take Pepcid over-the-counter as needed instead.

## 2019-04-09 NOTE — Progress Notes (Signed)
Patient ID: Theresa Watkins, female    DOB: 1966-11-04  MRN: [id number]  CC: No chief complaint on file.   Subjective: Theresa Watkins is a 53 y.o. female who presents for chronic ds management Her concerns today include:  Patient with history of HTN, HL,CVA(LT basal ganglia hemorrhagic infarct 12/2015),hypothyroidism(hx of thyroid CA 2011), GERD, anxious mood, tob dep  Patient presents with numerous issues to discuss today.  Woke up 3 days ago with soreness in thighs.  Hurt to touch all over but more so on the lateral aspect.   -no initiating factors - no stranous activities to account for it -walks 1-2 x a wk and did not over do it the last time she walked.   -No problems getting up from seated positions. -No soreness or pain in the calf. -On Lipitor which she says she takes consistently   HTN: not taking Norvasc.  States the day she was dx with high blood pressure in office, she just gotten into an argument with a friend and thinks that accounted for her blood pressure being elevated at that time.  Checking P 2 x a wk at a local pharmacy.  SBP has been in the low 100s and DP 40-50.  Highest was 145/90 -limits salt in foods -No chest pains or shortness of breath  CVA/HL:  Taking Lipitor and aspirin.  She continues to smoke but states she is working on quitting.  1 pack of cigarettes now last 3 weeks.  She has not set a quit date as yet.  Post surg Hypothyroidism/thyroid cancer: Feels hot for about 3 mins when she first lay down in bed at nights.  Sleeps with minimal clothes on.  Does not think it is hot flashes.  No episodes during the day.  She thinks the hot feeling comes from her vitamin D level being low. -reports having similar symptom when she was dx with thyroid cancer.  Last saw her endocrinologist at [facility] back in 2015 when she was diagnosed with thyroid cancer.  She was supposed to follow-up intermittently but is not been back in several years  Vit D  def: Recent vitamin D level was still low.  I sent a prescription to the pharmacy for high-dose vitamin D to be taken once a week for 12 weeks after which we will step her down  Request to be checked for chlamydia, gonorrhea and trichomonas.  Diagnosed with what she thought was chlamydia back in 2018.  It was actually trichomonas.  She was treated.  She has not been sexually active in the past several years.  She has minimal discharge at this time.  No vaginal itching.  She would like to be retested for chlamydia, gonorrhea and trichomonas.  RT shoulder pain: Complains of right shoulder pain which he states has been present ever since she had her stroke.  She states she had fallen on the shoulder when she had a stroke back in 2017.  Pain with certain movements.  She has problems with elevation above the head.  She is requesting pain medication that is currently on her med list.  The medication is [facility] which was prescribed to her in a limited quantity last year by her podiatrist after foot surgery.  Patient states that she cannot take ibuprofen or Motrin and that Tylenol upsets her stomach also.  In going over her med list, I see that she has Zantac on the med list.  Last rxn 03/28/2018 #60 without refill. I asked  whether she was still taking.  Patient stated no.  However she wanted to know whether she should be joining some class action suit because she gets emails concerning class action suit against the makers of Zantac due to reports that it can cause cancer  Patient Active Problem List   Diagnosis Date Noted  . Vitamin D deficiency 02/18/2018  . Toe deformity, acquired, right 02/18/2018  . 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  . Hyperlipemia 03/21/2017  . Headache 03/21/2017  . Tooth pain   . CVA (cerebral infarction) 12/27/2015  . HLD (hyperlipidemia)   . Hemiparesis affecting dominant side as late effect of stroke ([place])   . Tobacco abuse   . Cocaine abuse ([place])   . ETOH  abuse   . Hypothyroidism (acquired) 12/24/2015  . Drug abuse, cocaine type ([place]) 05/09/2014  . Hx of thyroid cancer 01/25/2014  . Essential hypertension 06/03/2012     Current Outpatient Medications on File Prior to Visit  Medication Sig Dispense Refill  . aspirin 325 MG tablet Take 325 mg by mouth daily.    Marland Kitchen atorvastatin (LIPITOR) 20 MG tablet Take 1 tablet (20 mg total) by mouth daily. 30 tablet 3  . Calcium Carbonate-Vitamin D (CALCIUM + D PO) Take 2 tablets by mouth 2 (two) times daily.     . cetirizine (ZYRTEC) 10 MG tablet Take 1 tablet (10 mg total) by mouth daily. 30 tablet 6  . hydrOXYzine (ATARAX/VISTARIL) 10 MG tablet Take 1 tablet (10 mg total) by mouth 3 (three) times daily as needed. 30 tablet 0  . nicotine polacrilex (RA NICOTINE GUM) 2 MG gum Chew 1 stick gum PRN to decrease cravings.  Max 30 pieces/24 hrs 100 tablet 1  . VITAMIN A PO Take 1 tablet by mouth daily.    . vitamin C (ASCORBIC ACID) 500 MG tablet Take 500 mg by mouth daily.     No current facility-administered medications on file prior to visit.     Allergies  Allergen Reactions  . Gadolinium Derivatives Hives, Itching and Swelling    After MultiHance (gadolinium) injection, pt began sneezing.  After exam, pt told MR tech Burna Mortimer that she was itching.  Dr. Owens Shark evaluated pt, and noticed hives on pt's back.  Pt said that she felt swelling in her throat.  Dr. Owens Shark directed that pt be taken to hospital via ambulance.    . Iohexol Hives, Itching and Other (See Comments)     Code: HIVES, Desc: pts tongue began itching post injection and throat burning, Onset Date: [id number]   . Oxycodone Itching    hives  . Proanthocyanidin Swelling    Swelling of the tongue  . Grapeseed Extract [Nutritional Supplements]     Tongue swelll  . Diphenhydramine Hcl Rash  . Tramadol Rash    Social History   Socioeconomic History  . Marital status: Divorced    Spouse name: Not on file  . Number of children: Not on file  .  Years of education: Not on file  . Highest education level: Not on file  Occupational History  . Not on file  Social Needs  . Financial resource strain: Not on file  . Food insecurity:    Worry: Not on file    Inability: Not on file  . Transportation needs:    Medical: Not on file    Non-medical: Not on file  Tobacco Use  . Smoking status: Light Tobacco Smoker    Packs/day: 0.10    Years:  25.00    Pack years: 2.50    Types: Cigarettes  . Smokeless tobacco: Never Used  . Tobacco comment: smoke 2 a day trying to quit  Substance and Sexual Activity  . Alcohol use: Yes    Alcohol/week: 0.0 standard drinks    Comment: occ  . Drug use: Yes    Types: Cocaine    Comment: 1-2x monthly  . Sexual activity: Not Currently    Birth control/protection: Surgical  Lifestyle  . Physical activity:    Days per week: Not on file    Minutes per session: Not on file  . Stress: Not on file  Relationships  . Social connections:    Talks on phone: Not on file    Gets together: Not on file    Attends religious service: Not on file    Active member of club or organization: Not on file    Attends meetings of clubs or organizations: Not on file    Relationship status: Not on file  . Intimate partner violence:    Fear of current or ex partner: Not on file    Emotionally abused: Not on file    Physically abused: Not on file    Forced sexual activity: Not on file  Other Topics Concern  . Not on file  Social History Narrative  . Not on file    Family History  Problem Relation Age of Onset  . Bell's palsy Mother   . Cancer Father   . Stroke Father   . Breast cancer Cousin     Past Surgical History:  Procedure Laterality Date  . ABDOMINAL HYSTERECTOMY    . CESAREAN SECTION    . goiter     removed  . laparoscopic  1988   removal  of ectopic preg, ruptured tube  . THERAPEUTIC ABORTION    . THYROIDECTOMY  march 2011   cancer    ROS: Review of Systems Negative except as stated  above  PHYSICAL EXAM: BP 105/76 (BP Location: Left Arm, Patient Position: Sitting, Cuff Size: Normal)   Pulse 78   Temp 98.6 F (37 C) (Oral)   Resp 18   Ht 5' 5.5" (1.664 m)   Wt 162 lb (73.5 kg)   SpO2 98%   BMI 26.55 kg/m   Physical Exam General appearance - alert, well appearing, and in no distress Mental status - normal mood, behavior, speech, dress, motor activity, and thought processes Mouth - mucous membranes moist, pharynx normal without lesions Neck - supple, no significant adenopathy Chest - clear to auscultation, no wheezes, rales or rhonchi, symmetric air entry Heart - normal rate, regular rhythm, normal S1, S2, no murmurs, rubs, clicks or gallops Neurological -cranial nerves grossly intact.  Grip 5/5 left, 4+/5 on right but patient seems to not be exerting much effort.  Power upper extremities 5/5 left, 4+/5 on the right again patient seems to not be exerting much effort.  Power lower extremities 5/5 bilaterally. Musculoskeletal -right shoulder: Moderate tenderness on palpation of the anterior joint which seems to be exaggerated.  Mild discomfort with passive range of motion in all direction. slight tenderness on palpation of the thigh muscles bilaterally Extremities -no lower extremity edema  CMP Latest Ref Rng & Units 06/30/2018 05/25/2018 09/25/2017  Glucose 70 - 99 mg/dL - 99 79  BUN 6 - 20 mg/dL - 10 8  Creatinine 0.44 - 1.00 mg/dL - 0.84 0.76  Sodium 135 - 145 mmol/L - 139 143  Potassium 3.5 - 5.1  mmol/L - 3.7 4.0  Chloride 98 - 111 mmol/L - 112(H) 106  CO2 22 - 32 mmol/L - 20(L) 24  Calcium 8.9 - 10.3 mg/dL - 7.9(L) 8.0(L)  Total Protein 6.0 - 8.5 g/dL 6.5 - -  Total Bilirubin 0.0 - 1.2 mg/dL 0.3 - -  Alkaline Phos 39 - 117 IU/L 77 - -  AST 0 - 40 IU/L 13 - -  ALT 0 - 32 IU/L 11 - -   Lipid Panel     Component Value Date/Time   CHOL 180 06/30/2018 1649   TRIG 93 06/30/2018 1649   HDL 47 06/30/2018 1649   CHOLHDL 3.8 06/30/2018 1649   CHOLHDL 3.7  12/26/2015 0408   VLDL 31 12/26/2015 0408   LDLCALC 114 (H) 06/30/2018 1649    CBC    Component Value Date/Time   WBC 4.7 06/30/2018 1649   WBC 5.0 05/25/2018 1545   RBC 5.51 (H) 06/30/2018 1649   RBC 5.19 (H) 05/25/2018 1545   HGB 15.1 06/30/2018 1649   HCT 46.3 06/30/2018 1649   PLT 206 06/30/2018 1649   MCV 84 06/30/2018 1649   MCH 27.4 06/30/2018 1649   MCH 27.6 05/25/2018 1545   MCHC 32.6 06/30/2018 1649   MCHC 31.8 05/25/2018 1545   RDW 13.3 06/30/2018 1649   LYMPHSABS 1.5 06/30/2018 1649   MONOABS 0.4 12/28/2015 0616   EOSABS 0.1 06/30/2018 1649   BASOSABS 0.0 06/30/2018 1649   Results for orders placed or performed in visit on 04/05/19  Vitamin D, 25-hydroxy  Result Value Ref Range   Vit D, 25-Hydroxy 15.9 (L) 30.0 - 100.0 ng/mL  TSH  Result Value Ref Range   TSH 2.020 0.450 - 4.500 uIU/mL    ASSESSMENT AND PLAN:  1. Essential hypertension Blood pressure at goal off of medication.  Norvasc taken off of med list.  Encourage low-salt diet.  Prescription sent to her pharmacy for blood pressure monitoring device.  Advised to check blood pressure at least once a week with goal being 130/80 or lower.  2. Myalgia I recommend decreasing Lipitor to 1 tablet every other day to see whether the muscle ache decreases. - CK - cyclobenzaprine (FLEXERIL) 5 MG tablet; Take 1 tablet (5 mg total) by mouth daily as needed for muscle spasms.  Dispense: 30 tablet; Refill: 1  3. Acute vaginitis - Urine cytology ancillary only  4. Hyperlipidemia, unspecified hyperlipidemia type - Hepatic Function Panel  5. Tobacco dependence Commended her on cutting back significantly.  She does not want anything to help with quitting at this time she feels that she can do it on her own.  I have encouraged her to set a quit date.  Less than 5 minutes spent on counseling.  6. Vitamin D deficiency - Vitamin D, Ergocalciferol, (DRISDOL) 1.25 MG (50000 UT) CAPS capsule; Take 1 capsule (50,000 Units  total) by mouth every 7 (seven) days.  Dispense: 12 capsule; Refill: 0  7. History of thyroid cancer It is been several years since she followed up with endocrinology for this.  We will get her in to see if there is any additional monitoring that we need to be doing at this time - Ambulatory referral to Endocrinology  8. Postoperative hypothyroidism - levothyroxine (SYNTHROID) 112 MCG tablet; Take 1 tablet (112 mcg total) by mouth daily.  Dispense: 90 tablet; Refill: 1  9. Primary osteoarthritis of right shoulder Last imaging of this shoulder revealed DJD changes. Advised patient that I do not prescribe Norco or  Percocet in this practice for noncancer pain.  Our options are meloxicam, Tylenol or Cymbalta.  Patient states that Tylenol upsets her stomach ulcers.  I question this as Norco contains Tylenol and she tolerated it well. Patient willing to try a low dose of Cymbalta.  If no relief we can refer to orthopedics for injection - DULoxetine (CYMBALTA) 20 MG capsule; Take 1 capsule (20 mg total) by mouth daily.  Dispense: 30 capsule; Refill: 3  10. Hemiparesis affecting dominant side as late effect of stroke ([place]) Residual but again I do think that patient was intentionally not putting forth full effort on exam of power on right side  Patient was given the opportunity to ask questions.  Patient verbalized understanding of the plan and was able to repeat key elements of the plan.   Orders Placed This Encounter  Procedures  . Hepatic Function Panel  . CK  . Ambulatory referral to Endocrinology     Requested Prescriptions   Signed Prescriptions Disp Refills  . cyclobenzaprine (FLEXERIL) 5 MG tablet 30 tablet 1    Sig: Take 1 tablet (5 mg total) by mouth daily as needed for muscle spasms.  Marland Kitchen levothyroxine (SYNTHROID) 112 MCG tablet 90 tablet 1    Sig: Take 1 tablet (112 mcg total) by mouth daily.  . Vitamin D, Ergocalciferol, (DRISDOL) 1.25 MG (50000 UT) CAPS capsule 12 capsule 0     Sig: Take 1 capsule (50,000 Units total) by mouth every 7 (seven) days.  . DULoxetine (CYMBALTA) 20 MG capsule 30 capsule 3    Sig: Take 1 capsule (20 mg total) by mouth daily.  . Blood Pressure Monitor DEVI 1 Device 0    Sig: Use as directed to check home blood pressure 2-3 times a week    Return in about 4 months (around 08/10/2019).  Karle Plumber, MD, FACP

## 2019-04-10 LAB — HEPATIC FUNCTION PANEL
ALT: 11 IU/L (ref 0–32)
AST: 14 IU/L (ref 0–40)
Albumin: 4.6 g/dL (ref 3.8–4.9)
Alkaline Phosphatase: 76 IU/L (ref 39–117)
Bilirubin Total: 0.3 mg/dL (ref 0.0–1.2)
Bilirubin, Direct: 0.08 mg/dL (ref 0.00–0.40)
Total Protein: 6.9 g/dL (ref 6.0–8.5)

## 2019-04-10 LAB — CK: Total CK: 176 U/L (ref 32–182)

## 2019-04-13 LAB — URINE CYTOLOGY ANCILLARY ONLY
Chlamydia: NEGATIVE
Neisseria Gonorrhea: NEGATIVE
Trichomonas: NEGATIVE

## 2019-04-14 ENCOUNTER — Telehealth: Payer: Self-pay

## 2019-04-14 NOTE — Telephone Encounter (Signed)
Contacted pt to go over lab results pt is aware and doesn't have any questions or concerns 

## 2019-05-07 ENCOUNTER — Telehealth: Payer: Self-pay | Admitting: Internal Medicine

## 2019-05-07 MED FILL — VIT D2 1.25 MG (50,000 UNIT: 1.25 MG | 28 days supply | Qty: 4 | Fill #1

## 2019-05-07 MED FILL — LEVOTHYROXINE 112 MCG TAB: 112 | 30 days supply | Qty: 30 | Fill #0

## 2019-05-07 MED FILL — CYCLOBENZAPRINE 5 MG TABLET: 5 | 30 days supply | Qty: 30 | Fill #1

## 2019-05-07 MED FILL — ATORVASTATIN 20 MG TABLET: 20 | 30 days supply | Qty: 30 | Fill #2

## 2019-05-07 MED FILL — DULoxetine HCL 20 MG CPEP: 20 | 30 days supply | Qty: 30 | Fill #1

## 2019-05-07 NOTE — Telephone Encounter (Signed)
Pt was complaining about how she wanted to be seen in person, it was explained how it would be set up as mychart, webex or televisit and the provider would decide whether it would be in person or not..the patient became upset

## 2019-05-07 NOTE — Telephone Encounter (Signed)
Patients call returned.  Patient identified by name and date of birth.  Patient irritated that she did not understand the procedures for having an in patient visit.  Patient was explained procedures and policies.  Patient verbalized understanding.  Patient is requesting an In Person visit.   Triage Nurse states every effort would be made to make that happen.  Patient acknowledged understanding of advice.

## 2019-05-18 ENCOUNTER — Other Ambulatory Visit: Payer: Self-pay | Admitting: Internal Medicine

## 2019-05-18 DIAGNOSIS — E782 Mixed hyperlipidemia: Secondary | ICD-10-CM

## 2019-06-09 MED FILL — VIT D2 1.25 MG (50,000 UNIT: 1.25 MG | 28 days supply | Qty: 4 | Fill #2

## 2019-06-30 MED FILL — VIT D2 1.25 MG (50,000 UNIT: 1.25 MG | 28 days supply | Qty: 4 | Fill #0

## 2019-07-09 ENCOUNTER — Ambulatory Visit: Payer: Medicaid Other | Admitting: Internal Medicine

## 2019-07-21 MED FILL — VIT D2 1.25 MG (50,000 UNIT: 1.25 MG | 28 days supply | Qty: 4 | Fill #0

## 2019-08-21 ENCOUNTER — Other Ambulatory Visit: Payer: Self-pay | Admitting: Family Medicine

## 2019-08-21 DIAGNOSIS — E890 Postprocedural hypothyroidism: Secondary | ICD-10-CM

## 2019-08-26 ENCOUNTER — Other Ambulatory Visit: Payer: Self-pay

## 2019-08-26 ENCOUNTER — Encounter: Payer: Self-pay | Admitting: Internal Medicine

## 2019-08-26 ENCOUNTER — Ambulatory Visit: Payer: Medicaid Other | Attending: Internal Medicine | Admitting: Internal Medicine

## 2019-08-26 ENCOUNTER — Other Ambulatory Visit (HOSPITAL_COMMUNITY)
Admission: RE | Admit: 2019-08-26 | Discharge: 2019-08-26 | Disposition: A | Discharge status: Home / Self Care | Payer: Medicaid Other | Source: Ambulatory Visit | Attending: Internal Medicine | Admitting: Internal Medicine

## 2019-08-26 VITALS — BP 130/84 | HR 78 | Temp 98.2°F | Resp 18 | Ht 65.5 in | Wt 165.0 lb

## 2019-08-26 DIAGNOSIS — R200 Anesthesia of skin: Secondary | ICD-10-CM | POA: Insufficient documentation

## 2019-08-26 DIAGNOSIS — E890 Postprocedural hypothyroidism: Secondary | ICD-10-CM | POA: Insufficient documentation

## 2019-08-26 DIAGNOSIS — Z2821 Immunization not carried out because of patient refusal: Secondary | ICD-10-CM

## 2019-08-26 DIAGNOSIS — Z113 Encounter for screening for infections with a predominantly sexual mode of transmission: Secondary | ICD-10-CM | POA: Diagnosis not present

## 2019-08-26 DIAGNOSIS — I10 Essential (primary) hypertension: Secondary | ICD-10-CM | POA: Diagnosis not present

## 2019-08-26 DIAGNOSIS — E039 Hypothyroidism, unspecified: Secondary | ICD-10-CM

## 2019-08-26 DIAGNOSIS — Z8673 Personal history of transient ischemic attack (TIA), and cerebral infarction without residual deficits: Secondary | ICD-10-CM | POA: Insufficient documentation

## 2019-08-26 DIAGNOSIS — Z7982 Long term (current) use of aspirin: Secondary | ICD-10-CM | POA: Diagnosis not present

## 2019-08-26 DIAGNOSIS — F329 Major depressive disorder, single episode, unspecified: Secondary | ICD-10-CM | POA: Diagnosis not present

## 2019-08-26 DIAGNOSIS — E782 Mixed hyperlipidemia: Secondary | ICD-10-CM

## 2019-08-26 DIAGNOSIS — F17200 Nicotine dependence, unspecified, uncomplicated: Secondary | ICD-10-CM

## 2019-08-26 DIAGNOSIS — Z85850 Personal history of malignant neoplasm of thyroid: Secondary | ICD-10-CM

## 2019-08-26 DIAGNOSIS — Z79899 Other long term (current) drug therapy: Secondary | ICD-10-CM | POA: Insufficient documentation

## 2019-08-26 DIAGNOSIS — F17210 Nicotine dependence, cigarettes, uncomplicated: Secondary | ICD-10-CM | POA: Insufficient documentation

## 2019-08-26 DIAGNOSIS — E559 Vitamin D deficiency, unspecified: Secondary | ICD-10-CM

## 2019-08-26 DIAGNOSIS — M79672 Pain in left foot: Secondary | ICD-10-CM | POA: Diagnosis present

## 2019-08-26 MED ORDER — VITAMIN D (ERGOCALCIFEROL) 1.25 MG (50000 UNIT) PO CAPS: 50000.0000 [IU] | ORAL_CAPSULE | ORAL | 0 refills | Status: DC

## 2019-08-26 MED ORDER — GABAPENTIN 300 MG PO CAPS: 300.0000 mg | ORAL_CAPSULE | Freq: Every day | ORAL | 3 refills | Status: DC

## 2019-08-26 NOTE — Patient Instructions (Signed)
Please check your blood pressure  Times a week.  Goal is 130/80 or lower.  Start Gabapentin at bedtime

## 2019-08-26 NOTE — Progress Notes (Signed)
Patient ID: Theresa Watkins, female    DOB: [birth year]  MRN: [id number]  CC: Annual Exam   Subjective: Theresa Watkins is a 53 y.o. female who presents for chronic ds management Her concerns today include:  Patient with history of HTN, HL,CVA(LT basal ganglia hemorrhagic infarct 12/2015),hypothyroidism(hx of thyroid CA 2011), GERD, anxious mood, tob dep  Patient is requesting to have STD checked.  She has a new partner.  Prior to this she has not been sexually active in 7 years.  They had intercourse recently and the condom broke.  She is not having any symptoms but wanted to be screened for STDs.    Still having pain in LT foot.  She describes it as a sharp shearing pain that occurs about 10 times a day.    She reports numbness in the foot which she has had ever since her surgery last year.  She is wanting something for pain in particular hydrocodone.  She tells me that I have told her in the past that I can give her some hydrocodone.  "Tylenol has messed up my stomach" Cymbalta makes her more depression though I question whether she has filled the prescription  HTN/CVA: checks BP "when I feel funny."  She cannot tell me specifically any readings but states that it is usually "as good as it was today."  She states that her blood pressure increases on days when she is stressed.  She limits salt in the foods.  She reports compliance with taking atorvastatin and aspirin.  Thyroid/history of thyroid cancer: Reports compliance with levothyroxine.  On last visit we had referred her to the endocrinologist but patient states she never got appointment.  Vit D: needs refill on vitamin D  Tobacco dependence : She is down to 1pk/Q4-5 days.  Wanting to quit.  She has not set a quit date as yet.  Patient Active Problem List   Diagnosis Date Noted  . 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 Tobacco dependence 04/09/2019  . Vitamin D deficiency 02/18/2018  . Toe deformity,  acquired, right 02/18/2018  . 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  . Hyperlipemia 03/21/2017  . Headache 03/21/2017  . Tooth pain   . CVA (cerebral infarction) 12/27/2015  . HLD (hyperlipidemia)   . Hemiparesis affecting dominant side as late effect of stroke ([place])   . Tobacco abuse   . Cocaine abuse ([place])   . ETOH abuse   . Postoperative hypothyroidism 12/24/2015  . Drug abuse, cocaine type ([place]) 05/09/2014  . Hx of thyroid cancer 01/25/2014  . Essential hypertension 06/03/2012     Current Outpatient Medications on File Prior to Visit  Medication Sig Dispense Refill  . aspirin 325 MG tablet Take 325 mg by mouth daily.    Marland Kitchen atorvastatin (LIPITOR) 20 MG tablet Take 1 tablet by mouth once daily 30 tablet 2  . Blood Pressure Monitor DEVI Use as directed to check home blood pressure 2-3 times a week 1 Device 0  . Calcium Carbonate-Vitamin D (CALCIUM + D PO) Take 2 tablets by mouth 2 (two) times daily.     . cetirizine (ZYRTEC) 10 MG tablet Take 1 tablet (10 mg total) by mouth daily. 30 tablet 6  . levothyroxine (SYNTHROID) 112 MCG tablet Take 1 tablet (112 mcg total) by mouth daily. Must keep upcoming office visit for refills 30 tablet 0  . nicotine polacrilex (RA NICOTINE GUM) 2 MG gum Chew 1 stick gum PRN to decrease cravings.  Max 30 pieces/24 hrs 100 tablet 1  . VITAMIN A PO Take 1 tablet by mouth daily.    . vitamin C (ASCORBIC ACID) 500 MG tablet Take 500 mg by mouth daily.    . hydrOXYzine (ATARAX/VISTARIL) 10 MG tablet Take 1 tablet (10 mg total) by mouth 3 (three) times daily as needed. (Patient not taking: Reported on 08/26/2019) 30 tablet 0   No current facility-administered medications on file prior to visit.     Allergies  Allergen Reactions  . Gadolinium Derivatives Hives, Itching and Swelling    After MultiHance (gadolinium) injection, pt began sneezing.  After exam, pt told MR tech Theresa Watkins that she was itching.  Dr. Owens Shark evaluated pt, and noticed hives  on pt's back.  Pt said that she felt swelling in her throat.  Dr. Owens Shark directed that pt be taken to hospital via ambulance.    . Iohexol Hives, Itching and Other (See Comments)     Code: HIVES, Desc: pts tongue began itching post injection and throat burning, Onset Date: [id number]   . Proanthocyanidin Swelling    Swelling of the tongue  . Grapeseed Extract [Nutritional Supplements]     Tongue swelll  . Diphenhydramine Hcl Rash  . Tramadol Rash    Social History   Socioeconomic History  . Marital status: Divorced    Spouse name: Not on file  . Number of children: Not on file  . Years of education: Not on file  . Highest education level: Not on file  Occupational History  . Not on file  Social Needs  . Financial resource strain: Not on file  . Food insecurity    Worry: Not on file    Inability: Not on file  . Transportation needs    Medical: Not on file    Non-medical: Not on file  Tobacco Use  . Smoking status: Light Tobacco Smoker    Packs/day: 0.10    Years: 25.00    Pack years: 2.50    Types: Cigarettes  . Smokeless tobacco: Never Used  . Tobacco comment: smoke 2 a day trying to quit  Substance and Sexual Activity  . Alcohol use: Yes    Alcohol/week: 0.0 standard drinks    Comment: occ  . Drug use: Yes    Types: Cocaine    Comment: 1-2x monthly  . Sexual activity: Not Currently    Birth control/protection: Surgical  Lifestyle  . Physical activity    Days per week: Not on file    Minutes per session: Not on file  . Stress: Not on file  Relationships  . Social Herbalist on phone: Not on file    Gets together: Not on file    Attends religious service: Not on file    Active member of club or organization: Not on file    Attends meetings of clubs or organizations: Not on file    Relationship status: Not on file  . Intimate partner violence    Fear of current or ex partner: Not on file    Emotionally abused: Not on file    Physically abused: Not  on file    Forced sexual activity: Not on file  Other Topics Concern  . Not on file  Social History Narrative  . Not on file    Family History  Problem Relation Age of Onset  . Bell's palsy Mother   . Cancer Father   . Stroke Father   . Breast cancer Cousin  Past Surgical History:  Procedure Laterality Date  . ABDOMINAL HYSTERECTOMY    . CESAREAN SECTION    . goiter     removed  . laparoscopic  1988   removal  of ectopic preg, ruptured tube  . THERAPEUTIC ABORTION    . THYROIDECTOMY  march 2011   cancer    ROS: Review of Systems Negative except as stated above  PHYSICAL EXAM: BP 130/84   Pulse 78   Temp 98.2 F (36.8 C) (Oral)   Resp 18   Ht 5' 5.5" (1.664 m)   Wt 165 lb (74.8 kg)   SpO2 95%   BMI 27.04 kg/m   Physical Exam  General appearance - alert, well appearing, and in no distress Mental status - normal mood, behavior, speech, dress, motor activity, and thought processes Mouth - mucous membranes moist, pharynx normal without lesions Neck - supple, no significant adenopathy Chest - clear to auscultation, no wheezes, rales or rhonchi, symmetric air entry Heart - normal rate, regular rhythm, normal S1, S2, no murmurs, rubs, clicks or gallops Extremities - peripheral pulses normal, no pedal edema, no clubbing or cyanosis MSK: Left foot-no edema or erythema.  No point tenderness.  Leap exam reveals decreased sensation on the dorsal and plantar surface  CMP Latest Ref Rng & Units 04/09/2019 06/30/2018 05/25/2018  Glucose 70 - 99 mg/dL - - 99  BUN 6 - 20 mg/dL - - 10  Creatinine 0.44 - 1.00 mg/dL - - 0.84  Sodium 135 - 145 mmol/L - - 139  Potassium 3.5 - 5.1 mmol/L - - 3.7  Chloride 98 - 111 mmol/L - - 112(H)  CO2 22 - 32 mmol/L - - 20(L)  Calcium 8.9 - 10.3 mg/dL - - 7.9(L)  Total Protein 6.0 - 8.5 g/dL 6.9 6.5 -  Total Bilirubin 0.0 - 1.2 mg/dL 0.3 0.3 -  Alkaline Phos 39 - 117 IU/L 76 77 -  AST 0 - 40 IU/L 14 13 -  ALT 0 - 32 IU/L 11 11 -   Lipid  Panel     Component Value Date/Time   CHOL 180 06/30/2018 1649   TRIG 93 06/30/2018 1649   HDL 47 06/30/2018 1649   CHOLHDL 3.8 06/30/2018 1649   CHOLHDL 3.7 12/26/2015 0408   VLDL 31 12/26/2015 0408   LDLCALC 114 (H) 06/30/2018 1649    CBC    Component Value Date/Time   WBC 4.7 06/30/2018 1649   WBC 5.0 05/25/2018 1545   RBC 5.51 (H) 06/30/2018 1649   RBC 5.19 (H) 05/25/2018 1545   HGB 15.1 06/30/2018 1649   HCT 46.3 06/30/2018 1649   PLT 206 06/30/2018 1649   MCV 84 06/30/2018 1649   MCH 27.4 06/30/2018 1649   MCH 27.6 05/25/2018 1545   MCHC 32.6 06/30/2018 1649   MCHC 31.8 05/25/2018 1545   RDW 13.3 06/30/2018 1649   LYMPHSABS 1.5 06/30/2018 1649   MONOABS 0.4 12/28/2015 0616   EOSABS 0.1 06/30/2018 1649   BASOSABS 0.0 06/30/2018 1649    ASSESSMENT AND PLAN: 1. Left foot pain Advised patient that I will not prescribe hydrocodone and we do not prescribe it at this clinic.  I think she may benefit from a low dose of gabapentin at bedtime.  She is willing to give it a try.  She is also agreeable to referral back to podiatry - gabapentin (NEURONTIN) 300 MG capsule; Take 1 capsule (300 mg total) by mouth at bedtime.  Dispense: 30 capsule; Refill: 3 - Ambulatory  referral to Podiatry  2. Tobacco dependence Commended her on cutting back.  Encouraged her to quit.  She does not feel she needs any medication at this time to help with quitting.  I have encouraged her to set a quit date.  Less than 5 minutes spent on counseling  3. Acquired hypothyroidism 4. History of thyroid cancer - Ambulatory referral to Endocrinology - TSH  5. Mixed hyperlipidemia - CBC - Comprehensive metabolic panel - Lipid panel  6. Vitamin D deficiency - Vitamin D, Ergocalciferol, (DRISDOL) 1.25 MG (50000 UT) CAPS capsule; Take 1 capsule (50,000 Units total) by mouth every 7 (seven) days.  Dispense: 12 capsule; Refill: 0  7. Screening for STDs (sexually transmitted diseases) - Cervicovaginal  ancillary only - HIV antibody (with reflex)  8. Influenza vaccination declined   Patient was given the opportunity to ask questions.  Patient verbalized understanding of the plan and was able to repeat key elements of the plan.   Orders Placed This Encounter  Procedures  . HIV antibody (with reflex)  . CBC  . Comprehensive metabolic panel  . Lipid panel  . TSH  . Ambulatory referral to Podiatry  . Ambulatory referral to Endocrinology     Requested Prescriptions   Signed Prescriptions Disp Refills  . gabapentin (NEURONTIN) 300 MG capsule 30 capsule 3    Sig: Take 1 capsule (300 mg total) by mouth at bedtime.  . Vitamin D, Ergocalciferol, (DRISDOL) 1.25 MG (50000 UT) CAPS capsule 12 capsule 0    Sig: Take 1 capsule (50,000 Units total) by mouth every 7 (seven) days.    Return in about 4 months (around 12/27/2019).  Karle Plumber, MD, FACP

## 2019-08-27 LAB — COMPREHENSIVE METABOLIC PANEL
ALT: 10 IU/L (ref 0–32)
AST: 14 IU/L (ref 0–40)
Albumin/Globulin Ratio: 2 (ref 1.2–2.2)
Albumin: 4.6 g/dL (ref 3.8–4.9)
Alkaline Phosphatase: 80 IU/L (ref 39–117)
BUN/Creatinine Ratio: 16 (ref 9–23)
BUN: 12 mg/dL (ref 6–24)
Bilirubin Total: 0.3 mg/dL (ref 0.0–1.2)
CO2: 25 mmol/L (ref 20–29)
Calcium: 8.7 mg/dL (ref 8.7–10.2)
Chloride: 103 mmol/L (ref 96–106)
Creatinine, Ser: 0.75 mg/dL (ref 0.57–1.00)
GFR calc Af Amer: 105 mL/min/{1.73_m2} (ref >59)
GFR calc non Af Amer: 91 mL/min/{1.73_m2} (ref >59)
Globulin, Total: 2.3 g/dL (ref 1.5–4.5)
Glucose: 87 mg/dL (ref 65–99)
Potassium: 4.2 mmol/L (ref 3.5–5.2)
Sodium: 143 mmol/L (ref 134–144)
Total Protein: 6.9 g/dL (ref 6.0–8.5)

## 2019-08-27 LAB — LIPID PANEL
Chol/HDL Ratio: 3.6 ratio (ref 0.0–4.4)
Cholesterol, Total: 220 mg/dL — ABNORMAL HIGH (ref 100–199)
HDL: 61 mg/dL (ref >39)
LDL Chol Calc (NIH): 140 mg/dL — ABNORMAL HIGH (ref 0–99)
Triglycerides: 106 mg/dL (ref 0–149)
VLDL Cholesterol Cal: 19 mg/dL (ref 5–40)

## 2019-08-27 LAB — CBC
Hematocrit: 48.8 % — ABNORMAL HIGH (ref 34.0–46.6)
Hemoglobin: 15.7 g/dL (ref 11.1–15.9)
MCH: 27.5 pg (ref 26.6–33.0)
MCHC: 32.2 g/dL (ref 31.5–35.7)
MCV: 86 fL (ref 79–97)
Platelets: 207 10*3/uL (ref 150–450)
RBC: 5.71 x10E6/uL — ABNORMAL HIGH (ref 3.77–5.28)
RDW: 13 % (ref 11.7–15.4)
WBC: 7.6 10*3/uL (ref 3.4–10.8)

## 2019-08-27 LAB — TSH: TSH: 1.42 u[IU]/mL (ref 0.450–4.500)

## 2019-08-27 LAB — HIV ANTIBODY (ROUTINE TESTING W REFLEX): HIV Screen 4th Generation wRfx: NONREACTIVE

## 2019-08-30 ENCOUNTER — Telehealth: Payer: Self-pay | Admitting: *Deleted

## 2019-08-30 NOTE — Telephone Encounter (Signed)
Patient verified DOB Patient is aware of labs being normal except for cholesterol level. Patient advised to take atorvastatin daily to address elevated cholesterol level. Patient admits to taking EOD and will begin daily. Still awaiting the swab results.

## 2019-08-30 NOTE — Telephone Encounter (Signed)
-----   Message from Ladell Pier, MD sent at 08/28/2019  2:38 PM EDT ----- Let pt know that her HIV test is negative. Blood count reveals no anemia.  Kidney and liver function tests nl.  LDL cholesterol 140 with goal being less than 70 given past hx of stroke. Was she taking the Atorvastatin consistently?  If not, please do so.  If she was taking consistently, then we need to increase the dose of Atorvastatin to 40 mg daily.  Please let me know if I need to send rxn for increase dose.  Thyroid level is nl.

## 2019-09-02 ENCOUNTER — Ambulatory Visit (INDEPENDENT_AMBULATORY_CARE_PROVIDER_SITE_OTHER): Payer: Medicaid Other | Admitting: Podiatry

## 2019-09-02 ENCOUNTER — Ambulatory Visit (INDEPENDENT_AMBULATORY_CARE_PROVIDER_SITE_OTHER): Payer: Medicaid Other

## 2019-09-02 ENCOUNTER — Other Ambulatory Visit: Payer: Self-pay

## 2019-09-02 ENCOUNTER — Encounter: Payer: Self-pay | Admitting: Podiatry

## 2019-09-02 DIAGNOSIS — M2041 Other hammer toe(s) (acquired), right foot: Secondary | ICD-10-CM

## 2019-09-02 DIAGNOSIS — M2011 Hallux valgus (acquired), right foot: Secondary | ICD-10-CM

## 2019-09-02 DIAGNOSIS — M7662 Achilles tendinitis, left leg: Secondary | ICD-10-CM | POA: Diagnosis not present

## 2019-09-02 NOTE — Progress Notes (Signed)
She presents today chief complaint of painful first and second metatarsophalangeal joints of the right foot states that they have got a flapping of her hammertoe on the right.  She states that she would like to have surgery to correct this.  She is also stating that the pin that I left in the back of her heel is still painful and is hurting her as she refers to surgery that was performed for plantar fibroma on the left foot.  She states that she went to see her primary care provider who offered her narcotics which she requested something like gabapentin instead.  She states that now she is taking gabapentin 100 mg at nighttime and the foot feels better.  Objective: Vital signs are stable she is alert and oriented x3 her left foot demonstrates a perfectly healed large excision of fibroma there is never any type of sharp instrumentation to the posterior aspect of her heel there was never a pin in her heel and radiographically today still demonstrates that there is no pain in the heel.  Radiographically however the right foot demonstrates a severe bunion deformity with increase in the first intermetatarsal angle greater than 20 degrees and a completely dislocated second metatarsophalangeal joint with hammertoe deformity and medial deviation of the lesser digits.  She has some mild osteopenia noted.  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  Chronic pain.  Plan: Discussed etiology pathology conservative surgical therapies.  At this point she is requesting pain medication I will not give her any pain medication today.  I am she is requesting surgical intervention I expressed to her that the I would not do surgery on her right foot then we could use toes turn corrector's to hold the toe down so that it would feel better or she can find another doctor they can work on her foot.  I would happy to be able to refer her to another doctor.  We placed  the toe corrected today she is very happy with that as well as a compression anklet dispensed another toe corrector Anna anklet and told her that it would probably not be covered by her Medicaid once she went to check out and was told that her Medicaid would not cover this she became irate and took off the toe corrector from her right foot and the ankle it from her right foot and threw them in the room violently and stated that if we want them so bad we can have them back.  We knew that she had another 1 and her hand back which she left with without pain 4.

## 2019-09-03 ENCOUNTER — Telehealth: Payer: Self-pay | Admitting: Internal Medicine

## 2019-09-03 LAB — CERVICOVAGINAL ANCILLARY ONLY
Candida Glabrata: NEGATIVE
Candida Vaginitis: NEGATIVE
Chlamydia: NEGATIVE
Comment: NEGATIVE
Comment: NEGATIVE
Comment: NEGATIVE
Comment: NEGATIVE
Comment: NORMAL
Neisseria Gonorrhea: NEGATIVE
Trichomonas: NEGATIVE

## 2019-09-03 NOTE — Telephone Encounter (Signed)
Dr. Wynetta Emery may you please results pt pap

## 2019-09-03 NOTE — Telephone Encounter (Signed)
New Message  Pt is calling to check on the results of her pap smear. Please f/u

## 2019-09-08 NOTE — Telephone Encounter (Signed)
Contacted pt and lvm asking pt to give me a call at her earliest convenience  

## 2019-09-22 ENCOUNTER — Encounter: Payer: Self-pay | Admitting: Podiatry

## 2019-10-04 ENCOUNTER — Other Ambulatory Visit: Payer: Self-pay | Admitting: Internal Medicine

## 2019-10-04 DIAGNOSIS — I10 Essential (primary) hypertension: Secondary | ICD-10-CM

## 2019-10-04 DIAGNOSIS — E890 Postprocedural hypothyroidism: Secondary | ICD-10-CM

## 2019-10-18 ENCOUNTER — Other Ambulatory Visit: Payer: Self-pay | Admitting: Internal Medicine

## 2019-10-18 DIAGNOSIS — E782 Mixed hyperlipidemia: Secondary | ICD-10-CM

## 2019-11-24 MED FILL — VIT D2 1.25 MG (50,000 UNIT: 1.25 MG | 28 days supply | Qty: 4 | Fill #1

## 2019-12-13 ENCOUNTER — Ambulatory Visit: Payer: Medicaid Other | Admitting: Internal Medicine

## 2019-12-13 ENCOUNTER — Other Ambulatory Visit: Payer: Self-pay

## 2019-12-15 ENCOUNTER — Other Ambulatory Visit: Payer: Self-pay | Admitting: Internal Medicine

## 2019-12-15 DIAGNOSIS — E782 Mixed hyperlipidemia: Secondary | ICD-10-CM

## 2019-12-16 ENCOUNTER — Ambulatory Visit: Payer: Medicaid Other | Attending: Internal Medicine | Admitting: Family

## 2019-12-16 ENCOUNTER — Other Ambulatory Visit: Payer: Self-pay

## 2019-12-16 DIAGNOSIS — Z79890 Hormone replacement therapy (postmenopausal): Secondary | ICD-10-CM | POA: Diagnosis not present

## 2019-12-16 DIAGNOSIS — R109 Unspecified abdominal pain: Secondary | ICD-10-CM | POA: Diagnosis present

## 2019-12-16 DIAGNOSIS — R1031 Right lower quadrant pain: Secondary | ICD-10-CM | POA: Insufficient documentation

## 2019-12-16 DIAGNOSIS — Z85850 Personal history of malignant neoplasm of thyroid: Secondary | ICD-10-CM | POA: Insufficient documentation

## 2019-12-16 DIAGNOSIS — Z79899 Other long term (current) drug therapy: Secondary | ICD-10-CM | POA: Insufficient documentation

## 2019-12-16 DIAGNOSIS — R1032 Left lower quadrant pain: Secondary | ICD-10-CM | POA: Diagnosis not present

## 2019-12-16 DIAGNOSIS — Z8673 Personal history of transient ischemic attack (TIA), and cerebral infarction without residual deficits: Secondary | ICD-10-CM | POA: Insufficient documentation

## 2019-12-16 DIAGNOSIS — Z7982 Long term (current) use of aspirin: Secondary | ICD-10-CM | POA: Insufficient documentation

## 2019-12-16 NOTE — Progress Notes (Signed)
Virtual Visit via Telephone Note  I connected with Theresa Watkins, on 12/16/2019 at 2:46 PM by telephone due to the COVID-19 pandemic and verified that I am speaking with the correct person using two identifiers.   Consent: I discussed the limitations, risks, security and privacy concerns of performing an evaluation and management service by telephone and the availability of in person appointments. I also discussed with the patient that there may be a patient responsible charge related to this service. The patient expressed understanding and agreed to proceed.  Due to current restrictions/limitations of in-office visits due to the COVID-19 pandemic, this scheduled clinical appointment was converted to a telehealth visit.  I discussed the limitations, risks, security and privacy concerns of performing an evaluation and management service by telephone and the availability of in person appointments. I also discussed with the patient that there may be a patient responsible charge related to this service. The patient expressed understanding and agreed to proceed.   Location of Patient: Home  Location of Provider: Clinic   Persons participating in Telemedicine visit: [place], NP Doloris Hall, CMA   History of Present Illness: 1. ABDOMINAL DISCOMFORT: Constant abdominal discomfort near the uterus area began 3 months ago. Abdominal discomfort from the center of the belly button to the bottom of the stomach bilaterally. 8/10 discomfort.   Hysterectomy years ago cannot recall when and doesn't know if it was full or partial. Says stomach is bigger that normal feels like she is pregnant but knows that she isn't. States "feels like balls are in there".States she had tumors before and they were removed but doesn't remember when.   Denies chest pain; shortness of breath; cough; fever; bloating; nausea; vomiting; diarrhea, blood in the stool, painful urination, vaginal  discharge.   Admits nausea twice per week; constipation; urinary incontinence once per month; lower back pain that comes and goes. Takes Tylenol for back pain before she goes to bed at night unsure if it works because she is asleep.  Requesting a pelvic examination for reassurance that she is well.   Past Medical History:  Diagnosis Date  . Abnormal Pap smear   . Bronchitis   . Fibroid   . Headache(784.0)   . Ovarian cyst   . Plantar fasciitis   . Seizures ([place])   . Stroke ([place])   . Thyroid cancer ([place]) [address]  . Trichomonas   . Urinary tract infection    Allergies  Allergen Reactions  . Gadolinium Derivatives Hives, Itching and Swelling    After MultiHance (gadolinium) injection, pt began sneezing.  After exam, pt told MR tech Burna Mortimer that she was itching.  Dr. Owens Shark evaluated pt, and noticed hives on pt's back.  Pt said that she felt swelling in her throat.  Dr. Owens Shark directed that pt be taken to hospital via ambulance.    . Iohexol Hives, Itching and Other (See Comments)     Code: HIVES, Desc: pts tongue began itching post injection and throat burning, Onset Date: [id number]   . Proanthocyanidin Swelling    Swelling of the tongue  . Grapeseed Extract [Nutritional Supplements]     Tongue swelll  . Diphenhydramine Hcl Rash  . Tramadol Rash    Current Outpatient Medications on File Prior to Visit  Medication Sig Dispense Refill  . amLODipine (NORVASC) 5 MG tablet Take 1 tablet (5 mg total) by mouth daily. 90 tablet 0  . aspirin 325 MG tablet Take 325 mg by mouth daily.    Marland Kitchen  atorvastatin (LIPITOR) 20 MG tablet Take 1 tablet by mouth once daily 30 tablet 0  . Blood Pressure Monitor DEVI Use as directed to check home blood pressure 2-3 times a week 1 Device 0  . Calcium Carbonate-Vitamin D (CALCIUM + D PO) Take 2 tablets by mouth 2 (two) times daily.     . cetirizine (ZYRTEC) 10 MG tablet Take 1 tablet (10 mg total) by mouth daily. 30 tablet 6  . gabapentin (NEURONTIN) 300 MG  capsule Take 1 capsule (300 mg total) by mouth at bedtime. 30 capsule 3  . hydrOXYzine (ATARAX/VISTARIL) 10 MG tablet Take 1 tablet (10 mg total) by mouth 3 (three) times daily as needed. (Patient not taking: Reported on 08/26/2019) 30 tablet 0  . levothyroxine (EUTHYROX) 112 MCG tablet Take 1 tablet (112 mcg total) by mouth daily before breakfast. 30 tablet 2  . nicotine polacrilex (RA NICOTINE GUM) 2 MG gum Chew 1 stick gum PRN to decrease cravings.  Max 30 pieces/24 hrs 100 tablet 1  . VITAMIN A PO Take 1 tablet by mouth daily.    . vitamin C (ASCORBIC ACID) 500 MG tablet Take 500 mg by mouth daily.    . Vitamin D, Ergocalciferol, (DRISDOL) 1.25 MG (50000 UT) CAPS capsule Take 1 capsule (50,000 Units total) by mouth every 7 (seven) days. 12 capsule 0   No current facility-administered medications on file prior to visit.    Observations/Objective: Alert and oriented x 3. Patient not in acute distress. Physical examination not completed as this is a telemedicine visit.  Assessment and Plan: 1. Bilateral lower abdominal discomfort: Follow-up with PCP on next week for examination.    Follow Up Instructions: Follow-up with PCP on next week for examination.   I discussed the assessment and treatment plan with the patient. The patient was provided an opportunity to ask questions and all were answered. The patient agreed with the plan and demonstrated an understanding of the instructions.   The patient was advised to call back or seek an in-person evaluation if the symptoms worsen or if the condition fails to improve as anticipated.     I provided 29 minutes total of non-face-to-face time during this encounter including median intraservice time, reviewing previous notes, labs, imaging, medications, management and patient verbalized understanding.    Camillia Herter, NP  [facility], [place]   12/16/2019, 2:16 PM

## 2020-01-05 ENCOUNTER — Other Ambulatory Visit: Payer: Self-pay | Admitting: Internal Medicine

## 2020-01-05 DIAGNOSIS — E559 Vitamin D deficiency, unspecified: Secondary | ICD-10-CM

## 2020-01-07 ENCOUNTER — Ambulatory Visit: Payer: Medicaid Other | Admitting: Internal Medicine

## 2020-01-13 ENCOUNTER — Other Ambulatory Visit: Payer: Self-pay | Admitting: Internal Medicine

## 2020-01-13 DIAGNOSIS — E782 Mixed hyperlipidemia: Secondary | ICD-10-CM

## 2020-01-13 DIAGNOSIS — E890 Postprocedural hypothyroidism: Secondary | ICD-10-CM

## 2020-03-10 ENCOUNTER — Other Ambulatory Visit: Payer: Self-pay | Admitting: Internal Medicine

## 2020-03-10 DIAGNOSIS — E559 Vitamin D deficiency, unspecified: Secondary | ICD-10-CM

## 2020-03-10 DIAGNOSIS — E890 Postprocedural hypothyroidism: Secondary | ICD-10-CM

## 2020-03-10 MED FILL — LEVOTHYROXINE 112 MCG TAB: 112 | 30 days supply | Qty: 30 | Fill #1

## 2020-04-06 ENCOUNTER — Ambulatory Visit: Payer: Medicaid Other

## 2020-04-10 ENCOUNTER — Telehealth: Payer: Self-pay

## 2020-04-10 ENCOUNTER — Ambulatory Visit: Payer: Medicaid Other | Attending: Family | Admitting: Family

## 2020-04-10 ENCOUNTER — Encounter: Payer: Self-pay | Admitting: Family

## 2020-04-10 ENCOUNTER — Other Ambulatory Visit: Payer: Self-pay

## 2020-04-10 VITALS — BP 138/87 | HR 93 | Temp 97.0°F | Resp 16 | Wt 163.6 lb

## 2020-04-10 DIAGNOSIS — F17210 Nicotine dependence, cigarettes, uncomplicated: Secondary | ICD-10-CM | POA: Diagnosis not present

## 2020-04-10 DIAGNOSIS — I10 Essential (primary) hypertension: Secondary | ICD-10-CM | POA: Insufficient documentation

## 2020-04-10 DIAGNOSIS — Z7982 Long term (current) use of aspirin: Secondary | ICD-10-CM | POA: Diagnosis not present

## 2020-04-10 DIAGNOSIS — E559 Vitamin D deficiency, unspecified: Secondary | ICD-10-CM | POA: Diagnosis not present

## 2020-04-10 DIAGNOSIS — Z79899 Other long term (current) drug therapy: Secondary | ICD-10-CM | POA: Diagnosis not present

## 2020-04-10 DIAGNOSIS — F1010 Alcohol abuse, uncomplicated: Secondary | ICD-10-CM | POA: Insufficient documentation

## 2020-04-10 DIAGNOSIS — Z800 Family history of malignant neoplasm of digestive organs: Secondary | ICD-10-CM | POA: Insufficient documentation

## 2020-04-10 DIAGNOSIS — E785 Hyperlipidemia, unspecified: Secondary | ICD-10-CM | POA: Insufficient documentation

## 2020-04-10 DIAGNOSIS — F1410 Cocaine abuse, uncomplicated: Secondary | ICD-10-CM | POA: Diagnosis not present

## 2020-04-10 DIAGNOSIS — E890 Postprocedural hypothyroidism: Secondary | ICD-10-CM

## 2020-04-10 DIAGNOSIS — Z85850 Personal history of malignant neoplasm of thyroid: Secondary | ICD-10-CM | POA: Diagnosis not present

## 2020-04-10 DIAGNOSIS — K625 Hemorrhage of anus and rectum: Secondary | ICD-10-CM | POA: Diagnosis not present

## 2020-04-10 DIAGNOSIS — M79672 Pain in left foot: Secondary | ICD-10-CM | POA: Diagnosis not present

## 2020-04-10 DIAGNOSIS — E782 Mixed hyperlipidemia: Secondary | ICD-10-CM

## 2020-04-10 NOTE — Telephone Encounter (Signed)
Could please refill pt medications

## 2020-04-10 NOTE — Telephone Encounter (Signed)
Will forward to nurse 

## 2020-04-10 NOTE — Patient Instructions (Signed)
Lab today. Referral to gastroenterology. Rectal Bleeding  Rectal bleeding is when blood comes out of the opening of the butt (anus). People with this kind of bleeding may notice bright red blood in their underwear or in the toilet after they poop (have a bowel movement). They may also have dark red or black poop (stool). Rectal bleeding is often a sign that something is wrong. It needs to be checked by a doctor. Follow these instructions at home: Watch for any changes in your condition. Take these actions to help with bleeding and discomfort:  Eat a diet that is high in fiber. This will keep your poop soft so it is easier for you to poop without pushing too hard. Ask your doctor to tell you what foods and drinks are high in fiber.  Drink enough fluid to keep your pee (urine) clear or pale yellow. This also helps keep your poop soft.  Try taking a warm bath. This may help with pain.  Keep all follow-up visits as told by your doctor. This is important. Get help right away if:  You have new bleeding.  You have more bleeding than before.  You have black or dark red poop.  You throw up (vomit) blood or something that looks like coffee grounds.  You have pain or tenderness in your belly (abdomen).  You have a fever.  You feel weak.  You feel sick to your stomach (nauseous).  You pass out (faint).  You have very bad pain in your butt.  You cannot poop. This information is not intended to replace advice given to you by your health care provider. Make sure you discuss any questions you have with your health care provider. Document Revised: 10/17/2017 Document Reviewed: 12/31/2015 Elsevier Patient Education  2020 [employer].

## 2020-04-10 NOTE — Telephone Encounter (Signed)
Contacted pt to see what medications she is needing refilled

## 2020-04-10 NOTE — Telephone Encounter (Signed)
Which medications does she need refilled?

## 2020-04-10 NOTE — Telephone Encounter (Signed)
Pt seen today by PA in PCP office. Pt request all medications called in. No refills on any medications. Pt uses [facility].

## 2020-04-10 NOTE — Progress Notes (Signed)
Patient ID: Theresa Watkins, female    DOB: [birth year]  MRN: [id number]  CC: Rectal bleeding  Subjective: Dane Gemelli is a 54 y.o. female with history of essential hypertension, drug abuse cocaine type, hyperlipidemia, vitamin D deficiency, and history of thyroid cancer who presents for rectal bleeding.  1. RECTAL BLEEDING:  Onset: 3 weeks ago began and ended 4 days ago  Prior episodes of GI bleeding: yes, periodically happens especially if eating something rough like peanuts or popcorn  Change in bowel habits: denies  Any blood, red, or maroon stools: Yes[x] , with some clots  Coughing or vomiting blood: Yes[]  No [x]  Blood in urine: Yes []  No[x]  Abdominal pain: Yes[]  No [x]  Chest pain: Yes[]  No [x]   Heart palpitations: Yes[]  No [x]  Confusion: Yes[]  No [x]  Dizziness: Yes[]  No [x]  Shortness of breath: Yes[]  No [x]  Fever: Yes[]  No [x]  Cold/clammy extremities: Yes[x]  No []  NSAID, anticoagulants, antiplatelet use: Yes[x] , daily Aspirin History of anemia: denies Family history of colon cancer:  Paternal aunt died of colon cancer. Maternal aunt died of colon cancer. Last colonoscopy: May 2013 at [facility] resulted a hyperplastic polyp. Patient has not followed up since then. Anal sex: denies  Blood with wiping: denies  Comments: Positive fecal occult blood 02/07/2010.   Patient Active Problem List   Diagnosis Date Noted  . Left foot pain 08/26/2019  . 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 Tobacco dependence 04/09/2019  . Vitamin D deficiency 02/18/2018  . Toe deformity, acquired, right 02/18/2018  . 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  . Hyperlipemia 03/21/2017  . Headache 03/21/2017  . Tooth pain   . CVA (cerebral infarction) 12/27/2015  . HLD (hyperlipidemia)   . Hemiparesis affecting dominant side as late effect of stroke ([place])   . Tobacco abuse   . Cocaine abuse ([place])   . ETOH abuse   . Postoperative  hypothyroidism 12/24/2015  . Drug abuse, cocaine type ([place]) 05/09/2014  . Hx of thyroid cancer 01/25/2014  . Essential hypertension 06/03/2012     Current Outpatient Medications on File Prior to Visit  Medication Sig Dispense Refill  . amLODipine (NORVASC) 5 MG tablet Take 1 tablet (5 mg total) by mouth daily. 90 tablet 0  . aspirin 325 MG tablet Take 325 mg by mouth daily.    Marland Kitchen atorvastatin (LIPITOR) 20 MG tablet Take 1 tablet by mouth once daily 30 tablet 0  . Blood Pressure Monitor DEVI Use as directed to check home blood pressure 2-3 times a week 1 Device 0  . Calcium Carbonate-Vitamin D (CALCIUM + D PO) Take 2 tablets by mouth 2 (two) times daily.     . cetirizine (ZYRTEC) 10 MG tablet Take 1 tablet (10 mg total) by mouth daily. 30 tablet 6  . EUTHYROX 112 MCG tablet TAKE 1 TABLET BY MOUTH ONCE DAILY BEFORE BREAKFAST 30 tablet 0  . gabapentin (NEURONTIN) 300 MG capsule Take 1 capsule (300 mg total) by mouth at bedtime. 30 capsule 3  . hydrOXYzine (ATARAX/VISTARIL) 10 MG tablet Take 1 tablet (10 mg total) by mouth 3 (three) times daily as needed. (Patient not taking: Reported on 08/26/2019) 30 tablet 0  . nicotine polacrilex (RA NICOTINE GUM) 2 MG gum Chew 1 stick gum PRN to decrease cravings.  Max 30 pieces/24 hrs 100 tablet 1  . VITAMIN A PO Take 1 tablet by mouth daily.    . vitamin C (ASCORBIC ACID) 500 MG tablet Take 500 mg by mouth  daily.    . Vitamin D, Ergocalciferol, (DRISDOL) 1.25 MG (50000 UNIT) CAPS capsule TAKE 1 CAPSULE BY MOUTH EVERY 7 DAYS 12 capsule 0   No current facility-administered medications on file prior to visit.    Allergies  Allergen Reactions  . Gadolinium Derivatives Hives, Itching and Swelling    After MultiHance (gadolinium) injection, pt began sneezing.  After exam, pt told MR tech Burna Mortimer that she was itching.  Dr. Owens Shark evaluated pt, and noticed hives on pt's back.  Pt said that she felt swelling in her throat.  Dr. Owens Shark directed that pt be taken to  hospital via ambulance.    . Iohexol Hives, Itching and Other (See Comments)     Code: HIVES, Desc: pts tongue began itching post injection and throat burning, Onset Date: [id number]   . Proanthocyanidin Swelling    Swelling of the tongue  . Grapeseed Extract [Nutritional Supplements]     Tongue swelll  . Diphenhydramine Hcl Rash  . Tramadol Rash    Social History   Socioeconomic History  . Marital status: Divorced    Spouse name: Not on file  . Number of children: Not on file  . Years of education: Not on file  . Highest education level: Not on file  Occupational History  . Not on file  Tobacco Use  . Smoking status: Light Tobacco Smoker    Packs/day: 0.10    Years: 25.00    Pack years: 2.50    Types: Cigarettes  . Smokeless tobacco: Never Used  . Tobacco comment: smoke 2 a day trying to quit  Substance and Sexual Activity  . Alcohol use: Yes    Alcohol/week: 0.0 standard drinks    Comment: occ  . Drug use: Yes    Types: Cocaine    Comment: 1-2x monthly  . Sexual activity: Not Currently    Birth control/protection: Surgical  Other Topics Concern  . Not on file  Social History Narrative  . Not on file   Social Determinants of Health   Financial Resource Strain:   . Difficulty of Paying Living Expenses:   Food Insecurity:   . Worried About Charity fundraiser in the Last Year:   . Arboriculturist in the Last Year:   Transportation Needs:   . Film/video editor (Medical):   Marland Kitchen Lack of Transportation (Non-Medical):   Physical Activity:   . Days of Exercise per Week:   . Minutes of Exercise per Session:   Stress:   . Feeling of Stress :   Social Connections:   . Frequency of Communication with Friends and Family:   . Frequency of Social Gatherings with Friends and Family:   . Attends Religious Services:   . Active Member of Clubs or Organizations:   . Attends Archivist Meetings:   Marland Kitchen Marital Status:   Intimate Partner Violence:   . Fear of  Current or Ex-Partner:   . Emotionally Abused:   Marland Kitchen Physically Abused:   . Sexually Abused:     Family History  Problem Relation Age of Onset  . Bell's palsy Mother   . Cancer Father   . Stroke Father   . Breast cancer Cousin     Past Surgical History:  Procedure Laterality Date  . ABDOMINAL HYSTERECTOMY    . CESAREAN SECTION    . goiter     removed  . laparoscopic  1988   removal  of ectopic preg, ruptured tube  . THERAPEUTIC ABORTION    .  THYROIDECTOMY  march 2011   cancer    ROS: Review of Systems Negative except as stated above  PHYSICAL EXAM: Vitals with BMI 04/10/2020 08/26/2019 08/26/2019  Height - - 5' 5.5"  Weight 163 lbs 10 oz - 165 lbs  BMI - - [id number]  Systolic 0000000 [id number]  Diastolic 87 84 88  Pulse 93 - 78  SpO2- 98%, room air  Temperature- 72F, oral  Physical Exam General appearance - alert, well appearing, and in no distress and oriented to person, place, and time Mental status - alert, oriented to person, place, and time, normal mood, behavior, speech, dress, motor activity, and thought processes Neck - supple, no significant adenopathy Lymphatics - no palpable lymphadenopathy, no hepatosplenomegaly Chest - clear to auscultation, no wheezes, rales or rhonchi, symmetric air entry, no tachypnea, retractions or cyanosis Heart - normal rate, regular rhythm, normal S1, S2, no murmurs, rubs, clicks or gallops Abdomen - soft, nontender, nondistended, no masses or organomegaly Rectal - declined by the patient Neurological - alert, oriented, normal speech, no focal findings or movement disorder noted, neck supple without rigidity, cranial nerves II through XII intact, funduscopic exam normal, discs flat and sharp, DTR's normal and symmetric, motor and sensory grossly normal bilaterally, normal muscle tone, no tremors, strength 5/5, Romberg sign negative, normal gait and station Skin - normal coloration and turgor, no rashes, no suspicious skin lesions  noted  ASSESSMENT AND PLAN: 1. Rectal bleeding: -Today collect CBC with differential to check blood count. -Patient has history of positive fecal occult blood in 2011. Patient has history of benign hyperplastic polyp in May of 2013 at [facility]  -Referral to gastroenterology for further evaluation. - CBC With Differential - Ambulatory referral to Gastroenterology  Patient was given the opportunity to ask questions.  Patient verbalized understanding of the plan and was able to repeat key elements of the plan. Patient was given clear instructions to go to Emergency Department or return to medical center if symptoms don't improve, worsen, or new problems develop.The patient verbalized understanding.  Camillia Herter, NP

## 2020-04-11 LAB — CBC WITH DIFFERENTIAL
Basophils Absolute: 0 10*3/uL (ref 0.0–0.2)
Basos: 0 %
EOS (ABSOLUTE): 0.1 10*3/uL (ref 0.0–0.4)
Eos: 2 %
Hematocrit: 47.2 % — ABNORMAL HIGH (ref 34.0–46.6)
Hemoglobin: 15.5 g/dL (ref 11.1–15.9)
Immature Grans (Abs): 0.1 10*3/uL (ref 0.0–0.1)
Immature Granulocytes: 1 %
Lymphocytes Absolute: 2.4 10*3/uL (ref 0.7–3.1)
Lymphs: 34 %
MCH: 28.2 pg (ref 26.6–33.0)
MCHC: 32.8 g/dL (ref 31.5–35.7)
MCV: 86 fL (ref 79–97)
Monocytes Absolute: 0.5 10*3/uL (ref 0.1–0.9)
Monocytes: 7 %
Neutrophils Absolute: 3.9 10*3/uL (ref 1.4–7.0)
Neutrophils: 56 %
RBC: 5.5 x10E6/uL — ABNORMAL HIGH (ref 3.77–5.28)
RDW: 13.3 % (ref 11.7–15.4)
WBC: 7 10*3/uL (ref 3.4–10.8)

## 2020-04-11 NOTE — Progress Notes (Signed)
Blood count higher than normal but improved since last visit.  Keep appointment with gastroenterology for rectal bleeding.

## 2020-04-12 ENCOUNTER — Telehealth: Payer: Self-pay

## 2020-04-12 ENCOUNTER — Other Ambulatory Visit: Payer: Self-pay | Admitting: Family

## 2020-04-12 ENCOUNTER — Encounter: Payer: Self-pay | Admitting: Physician Assistant

## 2020-04-12 MED ORDER — AMLODIPINE BESYLATE 5 MG PO TABS: 5.0000 mg | ORAL_TABLET | Freq: Every day | ORAL | 0 refills | Status: DC

## 2020-04-12 MED ORDER — LEVOTHYROXINE SODIUM 112 MCG PO TABS: ORAL_TABLET | ORAL | 3 refills | Status: DC

## 2020-04-12 MED ORDER — VITAMIN D (ERGOCALCIFEROL) 1.25 MG (50000 UNIT) PO CAPS: ORAL_CAPSULE | ORAL | 0 refills | Status: DC

## 2020-04-12 MED ORDER — GABAPENTIN 300 MG PO CAPS: 300.0000 mg | ORAL_CAPSULE | Freq: Every day | ORAL | 3 refills | Status: DC

## 2020-04-12 MED ORDER — ATORVASTATIN CALCIUM 20 MG PO TABS: 20.0000 mg | ORAL_TABLET | Freq: Every day | ORAL | 3 refills | Status: DC

## 2020-04-12 MED FILL — VIT D2 1.25 MG (50,000 UNIT: 1.25 MG | 28 days supply | Qty: 4 | Fill #0

## 2020-04-12 MED FILL — GABAPENTIN 300 MG CAPSULE: 300 | 30 days supply | Qty: 30 | Fill #0

## 2020-04-12 MED FILL — LEVOTHYROXINE 112 MCG TAB: 112 | 30 days supply | Qty: 30 | Fill #0

## 2020-04-12 MED FILL — AMLODIPINE BESYLATE 5 MG TA: 5 | 30 days supply | Qty: 30 | Fill #0

## 2020-04-12 MED FILL — ATORVASTATIN CALCIUM 20 MG: 20 | 30 days supply | Qty: 30 | Fill #0

## 2020-04-12 NOTE — Telephone Encounter (Signed)
Contacted pt to go over lab results pt is aware and doesn't have any questions or concerns 

## 2020-04-12 NOTE — Telephone Encounter (Signed)
I have spoken with pt and went over lab results. Made pt aware to accept the appt for July but have them put her on a cancellation list. Pt states she understands and doesn't have any questions or concenrs

## 2020-04-12 NOTE — Telephone Encounter (Signed)
Pt calling for lab results. Also pt feels her rectal bleeding needs seen by GI sooner than July. Pt states July is what was offered for appt. Please place a referral to a different Gastroenterologist.

## 2020-04-12 NOTE — Addendum Note (Signed)
Addended by: Camillia Herter on: 04/12/2020 12:32 PM   Modules accepted: Orders

## 2020-05-05 MED FILL — ATORVASTATIN CALCIUM 20 MG: 20 | 30 days supply | Qty: 30 | Fill #0

## 2020-05-05 MED FILL — VIT D2 1.25 MG (50,000 UNIT: 1.25 MG | 28 days supply | Qty: 4 | Fill #0

## 2020-05-05 MED FILL — GABAPENTIN 300 MG CAPSULE: 300 | 30 days supply | Qty: 30 | Fill #0

## 2020-05-05 MED FILL — AMLODIPINE BESYLATE 5 MG TA: 5 | 30 days supply | Qty: 30 | Fill #0

## 2020-05-05 MED FILL — LEVOTHYROXINE 112 MCG TAB: 112 | 30 days supply | Qty: 30 | Fill #0

## 2020-05-05 NOTE — Telephone Encounter (Signed)
Returned pt call to see what questions she had. Pt didn't answer and was unable to lvm due to phone kept ringing

## 2020-05-05 NOTE — Telephone Encounter (Signed)
Patient came in requesting her lab results. Patient was given results and has questions. Please f/u.

## 2020-05-08 ENCOUNTER — Encounter: Payer: Self-pay | Admitting: Physician Assistant

## 2020-05-08 ENCOUNTER — Ambulatory Visit: Payer: Medicaid Other | Admitting: Physician Assistant

## 2020-05-08 ENCOUNTER — Ambulatory Visit (INDEPENDENT_AMBULATORY_CARE_PROVIDER_SITE_OTHER): Payer: Medicaid Other | Admitting: Physician Assistant

## 2020-05-08 VITALS — BP 116/68 | HR 88 | Ht 64.0 in | Wt 164.0 lb

## 2020-05-08 DIAGNOSIS — Z86010 Personal history of colonic polyps: Secondary | ICD-10-CM

## 2020-05-08 DIAGNOSIS — K625 Hemorrhage of anus and rectum: Secondary | ICD-10-CM | POA: Diagnosis not present

## 2020-05-08 MED ORDER — SUTAB 1479-225-188 MG PO TABS: 1.0000 | ORAL_TABLET | Freq: Once | ORAL | 0 refills | Status: AC

## 2020-05-08 NOTE — Patient Instructions (Signed)
If you are age 55 or older, your body mass index should be between 23-30. Your Body mass index is 28.15 kg/m. If this is out of the aforementioned range listed, please consider follow up with your Primary Care Provider.  If you are age 46 or younger, your body mass index should be between 19-25. Your Body mass index is 28.15 kg/m. If this is out of the aformentioned range listed, please consider follow up with your Primary Care Provider.   You have been scheduled for a colonoscopy. Please follow written instructions given to you at your visit today.  Please pick up your prep supplies at the pharmacy within the next 1-3 days. If you use inhalers (even only as needed), please bring them with you on the day of your procedure. Your physician has requested that you go to www.startemmi.com and enter the access code given to you at your visit today. This web site gives a general overview about your procedure. However, you should still follow specific instructions given to you by our office regarding your preparation for the procedure.   Please continue your aspirin.  Do not hold for your procedure.   Thank you for entrusting me with your care and for choosing [facility], Amy Genia Harold, P.A.- C.

## 2020-05-08 NOTE — Progress Notes (Signed)
Subjective:    Patient ID: Theresa Watkins, female    DOB: August 21, 1966, 54 y.o.   MRN: [id number]  HPI  Theresa Watkins is a pleasant 54 year old African-American female, new to GI today referred by Gypsy Balsam, NP for evaluation of recent rectal bleeding. Patient has history of hypertension, prior CVA x2 on high-dose aspirin 325 daily, prior history of drug abuse, history of thyroid CA. By review of her records she [facility] in 2013 [facility] and had one hyperplastic polyp removed and was also noted to have mild diverticulosis of the ascending and transverse colon and a medium sized internal hemorrhoid. Relates that she had onset of rectal bleeding about 6 or 7 weeks ago when she went on a trip to [place] for a funeral.  She noticed bright red blood with bowel movements on a daily basis for almost a month.  Says the blood was bright red and noted on the tissue and in the commode.  She had not had any change in her bowel habits and did not have any associated abdominal pain or cramping.  She believes that she saw some small clots at times.  She had not developed any excessive straining or constipation. Over the past 2 to 3 weeks she has not noticed any recurrence of the bleeding.  She complained of lower abdominal pain but when asked to be more specific she is actually complaining of bilateral low back pain that radiates down the back of her legs bilaterally. CBC had been done in May 2021 with hemoglobin of 15.5 hematocrit of 47.2. Patient has family history of a maternal aunt with colon cancer and maternal grandmother apparently with colon cancer.  Review of Systems Pertinent positive and negative review of systems were noted in the above HPI section.  All other review of systems was otherwise negative.  Outpatient Encounter Medications as of 05/08/2020  Medication Sig  . aspirin 325 MG tablet Take 325 mg by mouth daily.  Marland Kitchen atorvastatin (LIPITOR) 20 MG tablet Take  1 tablet (20 mg total) by mouth daily.  . Blood Pressure Monitor DEVI Use as directed to check home blood pressure 2-3 times a week  . Calcium Carbonate-Vitamin D (CALCIUM + D PO) Take 2 tablets by mouth 2 (two) times daily.   . cetirizine (ZYRTEC) 10 MG tablet Take 1 tablet (10 mg total) by mouth daily.  Marland Kitchen gabapentin (NEURONTIN) 300 MG capsule Take 1 capsule (300 mg total) by mouth at bedtime. (Patient taking differently: Take 300 mg by mouth as needed. )  . levothyroxine (EUTHYROX) 112 MCG tablet TAKE 1 TABLET BY MOUTH ONCE DAILY BEFORE BREAKFAST  . VITAMIN A PO Take 1 tablet by mouth daily.  . vitamin C (ASCORBIC ACID) 500 MG tablet Take 500 mg by mouth daily.  . Vitamin D, Ergocalciferol, (DRISDOL) 1.25 MG (50000 UNIT) CAPS capsule TAKE 1 CAPSULE BY MOUTH EVERY 7 DAYS  . Sodium Sulfate-Mag Sulfate-KCl (SUTAB) [phone] MG TABS Take 1 kit by mouth once for 1 dose. BIN: [id number] PCN: CN GROUP: [id number] MEMBER ID: [id number];ZDG AS CASH  . [DISCONTINUED] amLODipine (NORVASC) 5 MG tablet Take 1 tablet (5 mg total) by mouth daily.  . [DISCONTINUED] hydrOXYzine (ATARAX/VISTARIL) 10 MG tablet Take 1 tablet (10 mg total) by mouth 3 (three) times daily as needed. (Patient not taking: Reported on 08/26/2019)  . [DISCONTINUED] nicotine polacrilex (RA NICOTINE GUM) 2 MG gum Chew 1 stick gum PRN to decrease cravings.  Max 30 pieces/24 hrs  . [  DISCONTINUED] VITAMIN A PO Take 1 tablet by mouth daily.   No facility-administered encounter medications on file as of 05/08/2020.   Allergies  Allergen Reactions  . Gadolinium Derivatives Hives, Itching and Swelling    After MultiHance (gadolinium) injection, pt began sneezing.  After exam, pt told MR tech Burna Mortimer that she was itching.  Dr. Owens Shark evaluated pt, and noticed hives on pt's back.  Pt said that she felt swelling in her throat.  Dr. Owens Shark directed that pt be taken to hospital via ambulance.    . Iohexol Hives, Itching and Other (See Comments)     Code:  HIVES, Desc: pts tongue began itching post injection and throat burning, Onset Date: [id number]   . Proanthocyanidin Swelling    Swelling of the tongue  . Grapeseed Extract [Nutritional Supplements]     Tongue swelll  . Diphenhydramine Hcl Rash  . Tramadol Rash   Patient Active Problem List   Diagnosis Date Noted  . Left foot pain 08/26/2019  . 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 Tobacco dependence 04/09/2019  . Vitamin D deficiency 02/18/2018  . Toe deformity, acquired, right 02/18/2018  . 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  . Hyperlipemia 03/21/2017  . Headache 03/21/2017  . Tooth pain   . CVA (cerebral infarction) 12/27/2015  . HLD (hyperlipidemia)   . Hemiparesis affecting dominant side as late effect of stroke ([place])   . Tobacco abuse   . Cocaine abuse ([place])   . ETOH abuse   . Postoperative hypothyroidism 12/24/2015  . Drug abuse, cocaine type ([place]) 05/09/2014  . Hx of thyroid cancer 01/25/2014  . Essential hypertension 06/03/2012   Social History   Socioeconomic History  . Marital status: Divorced    Spouse name: Not on file  . Number of children: 1  . Years of education: Not on file  . Highest education level: Not on file  Occupational History  . Occupation: disabled  Tobacco Use  . Smoking status: Light Tobacco Smoker    Packs/day: 0.10    Years: 25.00    Pack years: 2.50    Types: Cigarettes  . Smokeless tobacco: Never Used  . Tobacco comment: smoke 2 a day trying to quit  Vaping Use  . Vaping Use: Never used  Substance and Sexual Activity  . Alcohol use: Yes    Alcohol/week: 0.0 standard drinks    Comment: occ  . Drug use: Yes    Types: Cocaine    Comment: 1-2x monthly  . Sexual activity: Not Currently    Birth control/protection: Surgical  Other Topics Concern  . Not on file  Social History Narrative  . Not on file   Social Determinants of Health   Financial Resource Strain:   . Difficulty of Paying  Living Expenses:   Food Insecurity:   . Worried About Charity fundraiser in the Last Year:   . Arboriculturist in the Last Year:   Transportation Needs:   . Film/video editor (Medical):   Marland Kitchen Lack of Transportation (Non-Medical):   Physical Activity:   . Days of Exercise per Week:   . Minutes of Exercise per Session:   Stress:   . Feeling of Stress :   Social Connections:   . Frequency of Communication with Friends and Family:   . Frequency of Social Gatherings with Friends and Family:   . Attends Religious Services:   . Active Member of Clubs or Organizations:   . Attends Club  or Organization Meetings:   Marland Kitchen Marital Status:   Intimate Partner Violence:   . Fear of Current or Ex-Partner:   . Emotionally Abused:   Marland Kitchen Physically Abused:   . Sexually Abused:     Ms. Mcwilliams family history includes Bell's palsy in her mother; Breast cancer in her cousin; Colon cancer in her maternal aunt and paternal aunt; Diabetes in her mother and sister; Diverticulitis in her sister; Hypertension in her mother and sister; Prostate cancer in her father; Stroke in her father.      Objective:    Vitals:   05/08/20 1358  BP: 116/68  Pulse: 88    Physical Exam Well-developed well-nourished AA female in no acute distress.  Height, IDXFPK,441 BMI 28.15  HEENT; nontraumatic normocephalic, EOMI, PER R LA, sclera anicteric. Oropharynx;not done Neck; supple, no JVD Cardiovascular; regular rate and rhythm with S1-S2, no murmur rub or gallop Pulmonary; Clear bilaterally Abdomen; soft, nontender, nondistended, no palpable mass or hepatosplenomegaly, bowel sounds are active Rectal;not  done today Skin; benign exam, no jaundice rash or appreciable lesions Extremities; no clubbing cyanosis or edema skin warm and dry Neuro/Psych; alert and oriented x4, grossly nonfocal mood and affect appropriate       Assessment & Plan:   #74 54 year old African-American female with recent episode of  hematochezia, noticing bright red blood with bowel movements persistently over 1 month, then resolved over the past 3 weeks. Etiology of bleeding is not clear, this may have been secondary to internal hemorrhoids, rule out occult colon neoplasm. 2.  History of diverticulosis 3.  History of hyperplastic colon polyp-[facility] 4.  Prior history of CVA x2 on aspirin 325 daily 5.  Hypertension 6.  History of thyroid CA 7.  Prior history of drug abuse  Plan; patient will be scheduled for colonoscopy with Dr. Silverio Decamp. Procedure was discussed in detail with the patient including indications risks and benefits and she is agreeable to proceed.  She has completed COVID-19 vaccination. Further recommendations pending findings at colonoscopy. Kapena Hamme Genia Harold PA-C 05/08/2020   Cc: Ladell Pier, MD

## 2020-05-11 ENCOUNTER — Other Ambulatory Visit: Payer: Self-pay | Admitting: Internal Medicine

## 2020-05-11 DIAGNOSIS — E782 Mixed hyperlipidemia: Secondary | ICD-10-CM

## 2020-05-18 ENCOUNTER — Ambulatory Visit (AMBULATORY_SURGERY_CENTER): Payer: Medicaid Other | Admitting: Gastroenterology

## 2020-05-18 ENCOUNTER — Encounter: Payer: Self-pay | Admitting: Gastroenterology

## 2020-05-18 ENCOUNTER — Other Ambulatory Visit: Payer: Self-pay

## 2020-05-18 VITALS — BP 134/82 | HR 74 | Temp 96.6°F | Resp 20 | Ht 64.0 in | Wt 164.0 lb

## 2020-05-18 DIAGNOSIS — K921 Melena: Secondary | ICD-10-CM | POA: Diagnosis not present

## 2020-05-18 DIAGNOSIS — E785 Hyperlipidemia, unspecified: Secondary | ICD-10-CM | POA: Diagnosis not present

## 2020-05-18 DIAGNOSIS — K5730 Diverticulosis of large intestine without perforation or abscess without bleeding: Secondary | ICD-10-CM

## 2020-05-18 DIAGNOSIS — Z86010 Personal history of colonic polyps: Secondary | ICD-10-CM | POA: Diagnosis not present

## 2020-05-18 DIAGNOSIS — K649 Unspecified hemorrhoids: Secondary | ICD-10-CM

## 2020-05-18 DIAGNOSIS — K625 Hemorrhage of anus and rectum: Secondary | ICD-10-CM

## 2020-05-18 MED ORDER — SODIUM CHLORIDE 0.9 % IV SOLN: 500.0000 mL | Freq: Once | INTRAVENOUS | Status: DC

## 2020-05-18 NOTE — Op Note (Signed)
[facility] Patient Name: Theresa Watkins Procedure Date: 05/18/2020 9:36 AM MRN: [id number] Endoscopist: Mauri Pole , MD Age: 54 Referring MD:  Date of Birth: [birth year] Gender: Female Account #: [id number] Procedure:                Colonoscopy Indications:              Evaluation of unexplained GI bleeding presenting                            with Hematochezia Medicines:                Monitored Anesthesia Care Procedure:                Pre-Anesthesia Assessment:                           - Prior to the procedure, a History and Physical                            was performed, and patient medications and                            allergies were reviewed. The patient's tolerance of                            previous anesthesia was also reviewed. The risks                            and benefits of the procedure and the sedation                            options and risks were discussed with the patient.                            All questions were answered, and informed consent                            was obtained. Prior Anticoagulants: The patient has                            taken no previous anticoagulant or antiplatelet                            agents. ASA Grade Assessment: II - A patient with                            mild systemic disease. After reviewing the risks                            and benefits, the patient was deemed in                            satisfactory condition to undergo the procedure.  After obtaining informed consent, the colonoscope                            was passed under direct vision. Throughout the                            procedure, the patient's blood pressure, pulse, and                            oxygen saturations were monitored continuously. The                            Colonoscope was introduced through the anus and                            advanced to the the cecum, identified  by                            appendiceal orifice and ileocecal valve. The                            colonoscopy was performed without difficulty. The                            patient tolerated the procedure well. The quality                            of the bowel preparation was excellent. The                            ileocecal valve, appendiceal orifice, and rectum                            were photographed. Scope In: 9:44:53 AM Scope Out: 9:58:56 AM Scope Withdrawal Time: 0 hours 9 minutes 47 seconds  Total Procedure Duration: 0 hours 14 minutes 3 seconds  Findings:                 The perianal and digital rectal examinations were                            normal.                           Scattered small-mouthed diverticula were found in                            the sigmoid colon, descending colon and ascending                            colon.                           Non-bleeding internal hemorrhoids were found during  retroflexion. The hemorrhoids were large.                           The exam was otherwise without abnormality. Complications:            No immediate complications. Estimated Blood Loss:     Estimated blood loss was minimal. Impression:               - Diverticulosis in the sigmoid colon, in the                            descending colon and in the ascending colon.                           - Non-bleeding internal hemorrhoids likely etiology                            for hematochezia.                           - The examination was otherwise normal.                           - No specimens collected. Recommendation:           - Patient has a contact number available for                            emergencies. The signs and symptoms of potential                            delayed complications were discussed with the                            patient. Return to normal activities tomorrow.                            Written  discharge instructions were provided to the                            patient.                           - Resume previous diet.                           - Continue present medications.                           - Preparation H suppository per rectum at bedtime                            X7 days as needed                           - Call to schedule appointment for hemorrhoidal  banding if continue to have persistent symptoms or                            bleeding                           - Repeat colonoscopy in 10 years for screening                            purposes. Mauri Pole, MD 05/18/2020 10:06:13 AM This report has been signed electronically.

## 2020-05-18 NOTE — Patient Instructions (Signed)
Handouts given for diverticulosis, hemorrhoids and hemorrhoidal banding.  Call Dr. Woodward Ku office if you want to schedule the hemorrhoid banding.  Use Preparation H per rectum at bedtime for 7 nights, as needed.  YOU HAD AN ENDOSCOPIC PROCEDURE TODAY AT [facility]:   Refer to the procedure report that was given to you for any specific questions about what was found during the examination.  If the procedure report does not answer your questions, please call your gastroenterologist to clarify.  If you requested that your care partner not be given the details of your procedure findings, then the procedure report has been included in a sealed envelope for you to review at your convenience later.  YOU SHOULD EXPECT: Some feelings of bloating in the abdomen. Passage of more gas than usual.  Walking can help get rid of the air that was put into your GI tract during the procedure and reduce the bloating. If you had a lower endoscopy (such as a colonoscopy or flexible sigmoidoscopy) you may notice spotting of blood in your stool or on the toilet paper. If you underwent a bowel prep for your procedure, you may not have a normal bowel movement for a few days.  Please Note:  You might notice some irritation and congestion in your nose or some drainage.  This is from the oxygen used during your procedure.  There is no need for concern and it should clear up in a day or so.  SYMPTOMS TO REPORT IMMEDIATELY:   Following lower endoscopy (colonoscopy or flexible sigmoidoscopy):  Excessive amounts of blood in the stool  Significant tenderness or worsening of abdominal pains  Swelling of the abdomen that is new, acute  Fever of 100F or higher  For urgent or emergent issues, a gastroenterologist can be reached at any hour by calling [phone]. Do not use MyChart messaging for urgent concerns.    DIET:  We do recommend a small meal at first, but then you may proceed to your regular  diet.  Drink plenty of fluids but you should avoid alcoholic beverages for 24 hours.  ACTIVITY:  You should plan to take it easy for the rest of today and you should NOT DRIVE or use heavy machinery until tomorrow (because of the sedation medicines used during the test).    FOLLOW UP: Our staff will call the number listed on your records 48-72 hours following your procedure to check on you and address any questions or concerns that you may have regarding the information given to you following your procedure. If we do not reach you, we will leave a message.  We will attempt to reach you two times.  During this call, we will ask if you have developed any symptoms of COVID 19. If you develop any symptoms (ie: fever, flu-like symptoms, shortness of breath, cough etc.) before then, please call [phone].  If you test positive for Covid 19 in the 2 weeks post procedure, please call and report this information to [place].    If any biopsies were taken you will be contacted by phone or by letter within the next 1-3 weeks.  Please call us at [phone] if you have not heard about the biopsies in 3 weeks.    SIGNATURES/CONFIDENTIALITY: You and/or your care partner have signed paperwork which will be entered into your electronic medical record.  These signatures attest to the fact that that the information above on your After Visit Summary has been reviewed and is understood.  Full responsibility of the confidentiality of this discharge information lies with you and/or your care-partner.

## 2020-05-18 NOTE — Progress Notes (Signed)
Reviewed and agree with documentation and assessment and plan. K. Veena Galo Sayed , MD   

## 2020-05-18 NOTE — Progress Notes (Signed)
Vitals-CW  Patient states that "whatever time she had on the sheet."  Patient states that her BM was "clear as it's supposed to be.'  Patient is hostile due to questions we have to ask.  Pt's states no medical or surgical changes since previsit or office visit.

## 2020-05-18 NOTE — Progress Notes (Signed)
Report to PACU, RN, vss, BBS= Clear.  

## 2020-05-23 ENCOUNTER — Telehealth: Payer: Self-pay

## 2020-05-23 NOTE — Telephone Encounter (Signed)
°  Follow up Call-  Call back number 05/18/2020  Post procedure Call Back phone  # [phone]  Permission to leave phone message Yes  Some recent data might be hidden     Patient questions:  Do you have a fever, pain , or abdominal swelling? No. Pain Score  0 *  Have you tolerated food without any problems? Yes.    Have you been able to return to your normal activities? Yes.    Do you have any questions about your discharge instructions: Diet   No. Medications  No. Follow up visit  No.  Do you have questions or concerns about your Care? No.  Actions: * If pain score is 4 or above: No action needed, pain <4. 1. Have you developed a fever since your procedure? no  2.   Have you had an respiratory symptoms (SOB or cough) since your procedure? no  3.   Have you tested positive for COVID 19 since your procedure no  4.   Have you had any family members/close contacts diagnosed with the COVID 19 since your procedure?  no   If yes to any of these questions please route to Joylene John, RN and Erenest Rasher, RN

## 2020-06-12 ENCOUNTER — Telehealth: Payer: Self-pay | Admitting: Internal Medicine

## 2020-06-12 MED FILL — VIT D2 1.25 MG (50,000 UNIT: 1.25 MG | 84 days supply | Qty: 12 | Fill #1

## 2020-06-12 MED FILL — ATORVASTATIN CALCIUM 20 MG: 20 | 90 days supply | Qty: 90 | Fill #1

## 2020-06-12 NOTE — Telephone Encounter (Signed)
Will forward to pcp

## 2020-06-12 NOTE — Telephone Encounter (Signed)
[facility] is calling and would like to know if they can switch levothyroxine to [employer]

## 2020-06-13 NOTE — Telephone Encounter (Signed)
Returned call to [place] and spoke to [place] and per [place] pt transferred rx to a different pharmacy.

## 2020-07-17 MED FILL — LEVOTHYROXINE 112 MCG TAB: 112 | 30 days supply | Qty: 30 | Fill #0

## 2020-08-17 ENCOUNTER — Other Ambulatory Visit: Payer: Self-pay | Admitting: Internal Medicine

## 2020-08-17 ENCOUNTER — Other Ambulatory Visit: Payer: Self-pay | Admitting: Family

## 2020-08-17 DIAGNOSIS — E890 Postprocedural hypothyroidism: Secondary | ICD-10-CM

## 2020-08-17 DIAGNOSIS — E559 Vitamin D deficiency, unspecified: Secondary | ICD-10-CM

## 2020-08-17 MED FILL — LEVOTHYROXINE 112 MCG TAB: 112 | 30 days supply | Qty: 30 | Fill #1

## 2020-08-17 NOTE — Telephone Encounter (Signed)
Please advise patient on OV or refill

## 2020-08-18 MED FILL — VIT D2 1.25 MG (50,000 UNIT: 1.25 MG | 84 days supply | Qty: 12 | Fill #0

## 2020-08-18 NOTE — Telephone Encounter (Signed)
Could you please contact pt and schedule and ov

## 2020-08-30 MED FILL — VIT D2 1.25 MG (50,000 UNIT: 1.25 MG | 84 days supply | Qty: 12 | Fill #0

## 2020-08-30 MED FILL — LEVOTHYROXINE 112 MCG TAB: 112 | 30 days supply | Qty: 30 | Fill #1

## 2020-09-05 ENCOUNTER — Encounter (HOSPITAL_COMMUNITY): Payer: Self-pay

## 2020-09-05 ENCOUNTER — Emergency Department (HOSPITAL_COMMUNITY): Payer: Medicaid Other

## 2020-09-05 ENCOUNTER — Emergency Department (HOSPITAL_COMMUNITY)
Admission: EM | Admit: 2020-09-05 | Discharge: 2020-09-05 | Disposition: A | Discharge status: Home / Self Care | Payer: Medicaid Other | Attending: Emergency Medicine | Admitting: Emergency Medicine

## 2020-09-05 ENCOUNTER — Other Ambulatory Visit: Payer: Self-pay

## 2020-09-05 DIAGNOSIS — Z7982 Long term (current) use of aspirin: Secondary | ICD-10-CM | POA: Insufficient documentation

## 2020-09-05 DIAGNOSIS — Z79899 Other long term (current) drug therapy: Secondary | ICD-10-CM | POA: Diagnosis not present

## 2020-09-05 DIAGNOSIS — Z85850 Personal history of malignant neoplasm of thyroid: Secondary | ICD-10-CM | POA: Insufficient documentation

## 2020-09-05 DIAGNOSIS — R42 Dizziness and giddiness: Secondary | ICD-10-CM | POA: Diagnosis not present

## 2020-09-05 DIAGNOSIS — R519 Headache, unspecified: Secondary | ICD-10-CM | POA: Diagnosis not present

## 2020-09-05 DIAGNOSIS — F17210 Nicotine dependence, cigarettes, uncomplicated: Secondary | ICD-10-CM | POA: Diagnosis not present

## 2020-09-05 DIAGNOSIS — H539 Unspecified visual disturbance: Secondary | ICD-10-CM

## 2020-09-05 DIAGNOSIS — I10 Essential (primary) hypertension: Secondary | ICD-10-CM | POA: Insufficient documentation

## 2020-09-05 DIAGNOSIS — R29818 Other symptoms and signs involving the nervous system: Secondary | ICD-10-CM | POA: Diagnosis not present

## 2020-09-05 DIAGNOSIS — Z20822 Contact with and (suspected) exposure to covid-19: Secondary | ICD-10-CM | POA: Insufficient documentation

## 2020-09-05 DIAGNOSIS — I6602 Occlusion and stenosis of left middle cerebral artery: Secondary | ICD-10-CM | POA: Diagnosis not present

## 2020-09-05 DIAGNOSIS — F1410 Cocaine abuse, uncomplicated: Secondary | ICD-10-CM

## 2020-09-05 DIAGNOSIS — I6389 Other cerebral infarction: Secondary | ICD-10-CM | POA: Diagnosis not present

## 2020-09-05 LAB — I-STAT CHEM 8, ED
BUN: 16 mg/dL (ref 6–20)
Calcium, Ion: 0.74 mmol/L — CL (ref 1.15–1.40)
Chloride: 111 mmol/L (ref 98–111)
Creatinine, Ser: 0.8 mg/dL (ref 0.44–1.00)
Glucose, Bld: 88 mg/dL (ref 70–99)
HCT: 48 % — ABNORMAL HIGH (ref 36.0–46.0)
Hemoglobin: 16.3 g/dL — ABNORMAL HIGH (ref 12.0–15.0)
Potassium: 4.4 mmol/L (ref 3.5–5.1)
Sodium: 136 mmol/L (ref 135–145)
TCO2: 20 mmol/L — ABNORMAL LOW (ref 22–32)

## 2020-09-05 LAB — COMPREHENSIVE METABOLIC PANEL
ALT: 12 U/L (ref 0–44)
AST: 13 U/L — ABNORMAL LOW (ref 15–41)
Albumin: 3.8 g/dL (ref 3.5–5.0)
Alkaline Phosphatase: 61 U/L (ref 38–126)
Anion gap: 10 (ref 5–15)
BUN: 12 mg/dL (ref 6–20)
CO2: 24 mmol/L (ref 22–32)
Calcium: 8.6 mg/dL — ABNORMAL LOW (ref 8.9–10.3)
Chloride: 105 mmol/L (ref 98–111)
Creatinine, Ser: 0.91 mg/dL (ref 0.44–1.00)
GFR, Estimated: >60 mL/min (ref >60)
Glucose, Bld: 89 mg/dL (ref 70–99)
Potassium: 3.8 mmol/L (ref 3.5–5.1)
Sodium: 139 mmol/L (ref 135–145)
Total Bilirubin: 0.6 mg/dL (ref 0.3–1.2)
Total Protein: 6.8 g/dL (ref 6.5–8.1)

## 2020-09-05 LAB — URINALYSIS, ROUTINE W REFLEX MICROSCOPIC
Bilirubin Urine: NEGATIVE
Glucose, UA: NEGATIVE mg/dL
Hgb urine dipstick: NEGATIVE
Ketones, ur: NEGATIVE mg/dL
Leukocytes,Ua: NEGATIVE
Nitrite: NEGATIVE
Protein, ur: NEGATIVE mg/dL
Specific Gravity, Urine: 1.016 (ref 1.005–1.030)
pH: 7 (ref 5.0–8.0)

## 2020-09-05 LAB — RESPIRATORY PANEL BY RT PCR (FLU A&B, COVID)
Influenza A by PCR: NEGATIVE
Influenza B by PCR: NEGATIVE
SARS Coronavirus 2 by RT PCR: NEGATIVE

## 2020-09-05 LAB — CBC
HCT: 49 % — ABNORMAL HIGH (ref 36.0–46.0)
Hemoglobin: 15.4 g/dL — ABNORMAL HIGH (ref 12.0–15.0)
MCH: 28.3 pg (ref 26.0–34.0)
MCHC: 31.4 g/dL (ref 30.0–36.0)
MCV: 89.9 fL (ref 80.0–100.0)
Platelets: 199 10*3/uL (ref 150–400)
RBC: 5.45 MIL/uL — ABNORMAL HIGH (ref 3.87–5.11)
RDW: 14.6 % (ref 11.5–15.5)
WBC: 5.8 10*3/uL (ref 4.0–10.5)
nRBC: 0 % (ref 0.0–0.2)

## 2020-09-05 LAB — RAPID URINE DRUG SCREEN, HOSP PERFORMED
Amphetamines: NOT DETECTED
Barbiturates: NOT DETECTED
Benzodiazepines: NOT DETECTED
Cocaine: POSITIVE — AB
Opiates: NOT DETECTED
Tetrahydrocannabinol: NOT DETECTED

## 2020-09-05 LAB — DIFFERENTIAL
Abs Immature Granulocytes: 0.03 10*3/uL (ref 0.00–0.07)
Basophils Absolute: 0.1 10*3/uL (ref 0.0–0.1)
Basophils Relative: 1 %
Eosinophils Absolute: 0.1 10*3/uL (ref 0.0–0.5)
Eosinophils Relative: 1 %
Immature Granulocytes: 1 %
Lymphocytes Relative: 35 %
Lymphs Abs: 2 10*3/uL (ref 0.7–4.0)
Monocytes Absolute: 0.4 10*3/uL (ref 0.1–1.0)
Monocytes Relative: 8 %
Neutro Abs: 3.2 10*3/uL (ref 1.7–7.7)
Neutrophils Relative %: 54 %

## 2020-09-05 LAB — PROTIME-INR
INR: 0.9 (ref 0.8–1.2)
Prothrombin Time: 11.7 seconds (ref 11.4–15.2)

## 2020-09-05 LAB — APTT: aPTT: 31 seconds (ref 24–36)

## 2020-09-05 LAB — I-STAT BETA HCG BLOOD, ED (MC, WL, AP ONLY): I-stat hCG, quantitative: <5 m[IU]/mL (ref <5)

## 2020-09-05 LAB — ETHANOL: Alcohol, Ethyl (B): <10 mg/dL (ref <10)

## 2020-09-05 MED ORDER — LORAZEPAM 2 MG/ML IJ SOLN
1.0000 mg | Freq: Once | INTRAMUSCULAR | Status: AC
  Administered 2020-09-05: 1 mg via INTRAVENOUS
  Filled 2020-09-05: qty 1

## 2020-09-05 MED ORDER — SODIUM CHLORIDE 0.9% FLUSH
3.0000 mL | Freq: Once | INTRAVENOUS | Status: DC
Start: 2020-09-05 — End: 2020-09-06

## 2020-09-05 NOTE — ED Triage Notes (Addendum)
Emergency Medicine Provider Triage Evaluation Note  Theresa Watkins , a 54 y.o. female  was evaluated in triage. Pt complains of vertiginous symptoms and right eye blurriness that started at 10 AM this morning when she was standing up after cleaning out the litter box.  She states she often feels somewhat dizzy when she stands up however this symptom continued and has persisted to this time.  She states that she feels "like I am drunk "denies any chest pain or shortness of breath.  Denies any weakness or any numbness in extremities.  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  She is followed by [facility] although I do not see any recent appointment she appears to have been last seen in 2018.   Review of Systems  Positive: R eye blurry vision Negative: Weakness, numbness, slurred speech, vision loss  Physical Exam  BP (!) 143/103 (BP Location: Right Arm)   Pulse 69   Temp 98.3 F (36.8 C) (Oral)   Resp 17   Ht 5\' 6"  (1.676 m)   Wt 79.8 kg   SpO2 99%   BMI 28.41 kg/m  Gen:   Awake, no distress  HEENT:  Atraumatic  Resp:  Normal effort Cardiac:  Normal rate  Abd:   Nondistended, nontender  MSK:   Moves all 4 extremities without difficulty  Neuro:   Alert and oriented to self, place, time and event.   Speech is fluent, clear without dysarthria or dysphasia.   Strength 5-/5 in RUE. 5/5 LUE. 5/5 BL lower extremities Sensation intact in upper/lower extremities   POSITIVE Romberg. No pronator drift.  Normal finger-to-nose and feet tapping.  CN I not tested  CN II grossly intact visual fields bilaterally. Did not visualize posterior eye.  CN III, IV, VI PERRLA and EOMs intact bilaterally  CN V Intact sensation to sharp and light touch to the face  CN VII facial movements somewhat asymmetric but questionably chronic per patient (adonulous) CN VIII not tested  CN IX, X no uvula deviation, symmetric rise of  soft palate  CN XI 5/5 SCM and trapezius strength bilaterally  CN XII Midline tongue protrusion, symmetric L/R movements    Medical Decision Making  Medically screening exam initiated at 11:42 AM.  Appropriate orders placed.  [name] was informed that the remainder of the evaluation will be completed by another provider, this initial triage assessment does not replace that evaluation, and the importance of remaining in the ED until their evaluation is complete.  Clinical Impression   11:42 patient seen by myself in triage. Abn neuro exam w/ +Romberg. Stroke orders placed. Onset at 10AM per patient.   11:50 talked to Dr. Leonel Ramsay of neurology who recommends code stroke activation which was activated. Discussed with [facility] triage nurse who understands code stroke activation. Informed NS       Tedd Sias, PA 09/05/20 1202    Pati Gallo [place], [place] 09/05/20 1405

## 2020-09-05 NOTE — ED Triage Notes (Addendum)
Pt bib EMS from home w/ c/o sudden onset of blurred vision in R eye and dizziness, LKW 1000 today. Pt has hx of strokes w/ no deficits. PA to triage to eval for code stroke.

## 2020-09-05 NOTE — ED Provider Notes (Signed)
°  Physical Exam  BP 137/86    Pulse 86    Temp 98.3 F (36.8 C) (Oral)    Resp 17    Ht 5\' 6"  (1.676 m)    Wt 79.8 kg    SpO2 98%    BMI 28.41 kg/m   Physical Exam  ED Course/Procedures     Procedures  MDM  Care assumed at 3 PM.  Patient has history of strokes and is here with blurry vision.  Code stroke was activated and Dr. Leonel Ramsay saw the patient.  He recommend MRI and MRA and if it is unremarkable patient can be discharged.  9:42 PM MRI did not show a stroke.  MRA showed severe stenosis in the proximal left M1 segment as well as the left A2 and A3.  I discussed case with Dr. Curly [place] from neurology.  She states that patient likely has small vessel disease.  She states that this is not much to intervene on her and she does not recommend anticoagulation.  Patient has cocaine in her system and likely causing her symptoms. Told him to stop using cocaine    Drenda Freeze, MD 09/05/20 2144

## 2020-09-05 NOTE — ED Provider Notes (Signed)
[place] EMERGENCY DEPARTMENT Provider Note   CSN: [id number] Arrival date & time: 09/05/20  1117  An emergency department physician performed an initial assessment on this suspected stroke patient at 1142.  History Chief Complaint  Patient presents with  . Stroke Symptoms    Theresa Watkins is a 54 y.o. female.  HPI Patient presented to triage.  Called as a code stroke by the provider up there.  Around 10 this morning after cleaning the litter box states she got a little overwhelmed by the ammonia smell.  Began to feel dizzy.  States she felt drunk as if things were moving around.  No chest pain.  No trouble breathing.  Has had 2 previous strokes.  States she has had deficits on both sides from strokes.  May have a little baseline weakness on the right.  Also developed a headache after the dizziness began.  Met by myself in the CT scan along with Dr. Leonel Ramsay from neurology.  Also had some flashing lights in her right visual field.  No history of complicated migraines.    Past Medical History:  Diagnosis Date  . Abnormal Pap smear   . Bronchitis   . Fibroid   . Headache(784.0)   . Ovarian cyst   . Plantar fasciitis   . Seizures ([place])   . Stroke ([place])   . Thyroid cancer ([place]) [address]11  . Trichomonas   . Urinary tract infection     Patient Active Problem List   Diagnosis Date Noted  . Left foot pain 08/26/2019  . 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 Tobacco dependence 04/09/2019  . Vitamin D deficiency 02/18/2018  . Toe deformity, acquired, right 02/18/2018  . 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  . Hyperlipemia 03/21/2017  . Headache 03/21/2017  . Tooth pain   . CVA (cerebral infarction) 12/27/2015  . HLD (hyperlipidemia)   . Hemiparesis affecting dominant side as late effect of stroke ([place])   . Tobacco abuse   . Cocaine abuse ([place])   . ETOH abuse   . Postoperative hypothyroidism 12/24/2015  . Drug abuse,  cocaine type ([place]) 05/09/2014  . Hx of thyroid cancer 01/25/2014  . Essential hypertension 06/03/2012    Past Surgical History:  Procedure Laterality Date  . ABDOMINAL HYSTERECTOMY    . CESAREAN SECTION    . goiter     removed  . laparoscopic  1988   removal  of ectopic preg, ruptured tube  . THERAPEUTIC ABORTION    . THYROIDECTOMY  march 2011   cancer     OB History    Gravida  3   Para  1   Term  1   Preterm  0   AB  2   Living  1     SAB  0   TAB  1   Ectopic  1   Multiple  0   Live Births              Family History  Problem Relation Age of Onset  . Bell's palsy Mother   . Hypertension Mother   . Diabetes Mother   . Stroke Father   . Prostate cancer Father   . Breast cancer Cousin   . Hypertension Sister   . Colon cancer Maternal Aunt   . Colon cancer Paternal Aunt   . Diverticulitis Sister   . Diabetes Sister     Social History   Tobacco Use  . Smoking status: Light  Tobacco Smoker    Packs/day: 0.10    Years: 25.00    Pack years: 2.50    Types: Cigarettes  . Smokeless tobacco: Never Used  . Tobacco comment: smoke 2 a day trying to quit  Vaping Use  . Vaping Use: Never used  Substance Use Topics  . Alcohol use: Yes    Alcohol/week: 0.0 standard drinks    Comment: occ  . Drug use: Yes    Types: Cocaine    Comment: 1-2x monthly    Home Medications Prior to Admission medications   Medication Sig Start Date End Date Taking? Authorizing Provider  aspirin 325 MG tablet Take 325 mg by mouth daily.   Yes [provider]  atorvastatin (LIPITOR) 20 MG tablet Take 1 tablet (20 mg total) by mouth daily. 04/12/20  Yes Minette Brine, Amy J, NP  gabapentin (NEURONTIN) 300 MG capsule Take 1 capsule (300 mg total) by mouth at bedtime. Patient taking differently: Take 300 mg by mouth as needed (nerve pain).  04/12/20  Yes Minette Brine, Amy J, NP  levothyroxine (SYNTHROID) 112 MCG tablet TAKE 1 TABLET BY MOUTH DAILY BEFORE BREAKFAST. 08/17/20   Yes Ladell Pier, MD  Vitamin D, Ergocalciferol, (DRISDOL) 1.25 MG (50000 UNIT) CAPS capsule TAKE 1 CAPSULE BY MOUTH EVERY 7 DAYS Patient taking differently: Take 50,000 Units by mouth every 7 (seven) days. TAKE 1 CAPSULE BY MOUTH EVERY 7 DAYS on Wednesday 08/17/20  Yes Ladell Pier, MD  Blood Pressure Monitor DEVI Use as directed to check home blood pressure 2-3 times a week 04/09/19   Ladell Pier, MD  cetirizine (ZYRTEC) 10 MG tablet Take 1 tablet (10 mg total) by mouth daily. Patient not taking: Reported on 09/05/2020 02/17/17   Alfonse Spruce, FNP  vitamin C (ASCORBIC ACID) 500 MG tablet Take 500 mg by mouth daily. Patient not taking: Reported on 09/05/2020    [provider]    Allergies    Gadolinium derivatives, Iohexol, Proanthocyanidin, Grapeseed extract [nutritional supplements], Diphenhydramine hcl, and Tramadol  Review of Systems   Review of Systems  Constitutional: Negative for appetite change.  HENT: Negative for congestion.   Respiratory: Negative for shortness of breath.   Cardiovascular: Negative for chest pain.  Gastrointestinal: Negative for abdominal pain.  Musculoskeletal: Negative for back pain.  Skin: Negative for rash.  Neurological: Positive for dizziness and headaches. Negative for syncope and speech difficulty.  Psychiatric/Behavioral: Negative for confusion.    Physical Exam Updated Vital Signs BP (!) 149/102   Pulse 78   Temp 98.3 F (36.8 C) (Oral)   Resp 14   Ht 5' 6"  (1.676 m)   Wt 79.8 kg   SpO2 100%   BMI 28.41 kg/m   Physical Exam Vitals reviewed.  HENT:     Head: Atraumatic.  Eyes:     Extraocular Movements: Extraocular movements intact.  Cardiovascular:     Rate and Rhythm: Regular rhythm.  Pulmonary:     Breath sounds: No wheezing or rhonchi.  Abdominal:     Tenderness: There is no abdominal tenderness.  Musculoskeletal:     Cervical back: Neck supple.  Skin:    General: Skin is warm.      Capillary Refill: Capillary refill takes less than 2 seconds.  Neurological:     Mental Status: She is alert.     Comments: Eye movements intact.  Mildly asymmetric face.  Positive Romberg.  May have mild drift on right.  Sensation grossly intact bilaterally.  Complete NIH  scoring done by neurology.     ED Results / Procedures / Treatments   Labs (all labs ordered are listed, but only abnormal results are displayed) Labs Reviewed  CBC - Abnormal; Notable for the following components:      Result Value   RBC 5.45 (*)    Hemoglobin 15.4 (*)    HCT 49.0 (*)    All other components within normal limits  COMPREHENSIVE METABOLIC PANEL - Abnormal; Notable for the following components:   Calcium 8.6 (*)    AST 13 (*)    All other components within normal limits  I-STAT CHEM 8, ED - Abnormal; Notable for the following components:   Calcium, Ion 0.74 (*)    TCO2 20 (*)    Hemoglobin 16.3 (*)    HCT 48.0 (*)    All other components within normal limits  RESPIRATORY PANEL BY RT PCR (FLU A&B, COVID)  PROTIME-INR  APTT  DIFFERENTIAL  ETHANOL  RAPID URINE DRUG SCREEN, HOSP PERFORMED  URINALYSIS, ROUTINE W REFLEX MICROSCOPIC  CBG MONITORING, ED  I-STAT BETA HCG BLOOD, ED ([facility], [facility], [facility] ONLY)    EKG EKG Interpretation  Date/Time:  Tuesday September 05 2020 12:27:47 EDT Ventricular Rate:  56 PR Interval:    QRS Duration: 104 QT Interval:  527 QTC Calculation: 509 R Axis:   -30 Text Interpretation: Sinus rhythm Consider left atrial enlargement Left axis deviation Minimal ST elevation, anterior leads Borderline prolonged QT interval Confirmed by Davonna Belling [phone]) on 09/05/2020 2:37:40 PM   Radiology CT HEAD CODE STROKE WO CONTRAST`  Result Date: 09/05/2020 CLINICAL DATA:  Code stroke. EXAM: CT HEAD WITHOUT CONTRAST TECHNIQUE: Contiguous axial images were obtained from the base of the skull through the vertex without intravenous contrast. COMPARISON:  CT head 2017, MRI brain 2018  FINDINGS: Brain: There is no acute intracranial hemorrhage, mass effect, or edema. Chronic infarct of the left basal ganglia and adjacent white matter. Chronic infarcts of the periventricular white matter adjacent to the ventral lateral ventricles. There is no hydrocephalus. No acute appearing loss of gray-white differentiation. No extra-axial fluid collection. Vascular: No hyperdense vessel. Skull: Unremarkable. Sinuses/Orbits: Visualized portions are aerated. Visualized orbits are unremarkable. Other: Included mastoid air cells are clear. ASPECTS ([place] Stroke Program Early CT Score) - Ganglionic level infarction (caudate, lentiform nuclei, internal capsule, insula, M1-M3 cortex): 7 - Supraganglionic infarction (M4-M6 cortex): 3 Total score (0-10 with 10 being normal): 10 IMPRESSION: No acute intracranial hemorrhage or evidence of acute infarction. Chronic findings detailed above. These results were communicated to Dr. Leonel Ramsay at 12:11 pm on 09/05/2020 by text page via the [facility] messaging system. Electronically Signed   By: Macy Mis M.D.   On: 09/05/2020 12:14    Procedures Procedures (including critical care time)  Medications Ordered in ED Medications  sodium chloride flush (NS) 0.9 % injection 3 mL (has no administration in time range)    ED Course  I have reviewed the triage vital signs and the nursing notes.  Pertinent labs & imaging results that were available during my care of the patient were reviewed by me and considered in my medical decision making (see chart for details).    MDM Rules/Calculators/A&P                          Patient came in as a code stroke.  Had dizziness.  Head CT reassuring.  Mild symptoms.  Not TPA candidate due to the mild symptoms.  Seen by neurology and recommended MRI and MRA.  If negative likely able to discharge home.  Does have mild hypocalcemia that can be likely supplemented as an outpatient.  Care will be turned over to oncoming  provider. Per neurology note may need treatment for complicated migraine if still having symptoms and negative MRI. Final Clinical Impression(s) / ED Diagnoses Final diagnoses:  Dizziness  Hypocalcemia    Rx / DC Orders ED Discharge Orders    None       Davonna Belling, MD 09/05/20 1529

## 2020-09-05 NOTE — Social Work (Signed)
CSW arranged transportation for d/c via [employer].

## 2020-09-05 NOTE — Consult Note (Signed)
Neurology Consultation Reason for Consult: Dizziness Referring Physician: Robyn Haber  CC: Dizziness, blurred vision  History is obtained from: Patient  HPI: Theresa Watkins is a 54 y.o. female with a history of seizures as well as stroke who was in her normal state of health this morning when she had been cleaning the kitty litter and had sudden onset of dizziness, blurred vision.  She states that the smell has been very strong.  When she arrived to the emergency department, she also developed right occipital headache.  She described that she had some flashing lights in the right visual field and it appeared like "staticky."  LKW: 10 AM tpa given?: no, mild symptoms   ROS: A 14 point ROS was performed and is negative except as noted in the HPI.  Past Medical History:  Diagnosis Date  . Abnormal Pap smear   . Bronchitis   . Fibroid   . Headache(784.0)   . Ovarian cyst   . Plantar fasciitis   . Seizures ([place])   . Stroke ([place])   . Thyroid cancer ([place]) 20011  . Trichomonas   . Urinary tract infection      Family History  Problem Relation Age of Onset  . Bell's palsy Mother   . Hypertension Mother   . Diabetes Mother   . Stroke Father   . Prostate cancer Father   . Breast cancer Cousin   . Hypertension Sister   . Colon cancer Maternal Aunt   . Colon cancer Paternal Aunt   . Diverticulitis Sister   . Diabetes Sister      Social History:  reports that she has been smoking cigarettes. She has a 2.50 pack-year smoking history. She has never used smokeless tobacco. She reports current alcohol use. She reports current drug use. Drug: Cocaine.   Exam: Current vital signs: BP (!) 143/103 (BP Location: Right Arm)   Pulse 69   Temp 98.3 F (36.8 C) (Oral)   Resp 17   Ht 5\' 6"  (1.676 m)   Wt 79.8 kg   SpO2 99%   BMI 28.41 kg/m  Vital signs in last 24 hours: Temp:  [98.3 F (36.8 C)] 98.3 F (36.8 C) (10/19 1129) Pulse Rate:  [69] 69 (10/19 1129) Resp:  [17] 17  (10/19 1129) BP: (143)/(103) 143/103 (10/19 1129) SpO2:  [99 %] 99 % (10/19 1129) Weight:  [79.8 kg] 79.8 kg (10/19 1129)   Physical Exam  Constitutional: Appears well-developed and well-nourished.  Psych: Affect appropriate to situation Eyes: No scleral injection HENT: No OP obstrucion MSK: no joint deformities.  Cardiovascular: Normal rate and regular rhythm.  Respiratory: Effort normal, non-labored breathing GI: Soft.  No distension. There is no tenderness.  Skin: WDI  Neuro: Mental Status: Patient is awake, alert, oriented to person, place, month, year, and situation. Patient is able to give a clear and coherent history. No signs of aphasia or neglect Cranial Nerves: II: Visual Fields are full. Pupils are equal, round, and reactive to light.   III,IV, VI: EOMI without ptosis or diploplia.  V: Facial sensation is symmetric to temperature VII: Facial movement is diminished on the right VIII: hearing is intact to voice X: Uvula elevates symmetrically XI: Shoulder shrug is symmetric. XII: tongue is midline without atrophy or fasciculations.  Motor: Tone is normal. Bulk is normal. 5/5 strength was present on the left as well as in the right leg.  She has some mild 4/5 weakness of the right arm with mild drift. Sensory: Sensation  is diminished throughout the right side Cerebellar: Consistent with weakness in the right arm, otherwise intact   I have reviewed labs in epic and the results pertinent to this consultation are: I-STAT calcium 0.7  I have reviewed the images obtained: CT head-unremarkable  Impression: 54 year old female with transient dizziness/positive visual symptoms now with right occipital headache.  Though it is possible that she has had a new ischemic stroke, with her description I think that migraine aura would also be a significant possibility.  With her low calcium, I wonder if this is playing a role.  Recommendations: 1) MRI/MRA head and neck, if this  is negative I would treat as complicated migraine. 2) if she were to worsen before 2:30 PM, could consider IV TPA, but she is too mild to treat at the current time.   Roland Rack, MD [facility] [phone]  If 7pm- 7am, please page neurology on call as listed in [place].

## 2020-09-05 NOTE — Code Documentation (Signed)
Stroke Response Nurse Documentation Code Documentation  Theresa Watkins is a 54 y.o. female arriving to [place] ED via EMS on 09/05/20 with past medical hx of stroke, hyperlipidemia, and smoking. Code stroke was activated by ED. Patient from home where she was LKW at 1000. Pt reported cleaning out the litter box this morning and the ammonia smell being strong. She sat down and "Make A Deal" was on the TV. She noted some right blurriness and dizziness. Pt had three episodes with "static" vision prior to calling EMS.  Pt taken to triage and ED triage called ED Provider to evaluate. After evaluation, the a Code Stroke was activated. Stroke team at the bedside in CT. Labs drawn and patient cleared for CT by Dr. Alvino Chapel. NIHSS 3, see documentation for details and code stroke times. Patient with right facial droop, right arm weakness and left decreased sensation on exam. The following imaging was completed:  CT. Patient is not a candidate for tPA due to being too mild to treat. Care/Plan: q30 mNIHSS/VS until patient is outside the window at 1451m then q2 for 12 hours. Bedside handoff with ED RN HIlary.    Kathrin Greathouse  Stroke Response RN

## 2020-09-05 NOTE — ED Notes (Signed)
Transport came to get pt for MRI but was informed by MRI upon arrival that pt is being bumped by a code stroke.. RN already administered 1mg  of ativan to pt for the MRI.Marland Kitchen RN will ask edp abt more meds at time of MRI

## 2020-09-05 NOTE — ED Notes (Signed)
ACTIVATED CODE STROKE

## 2020-09-05 NOTE — ED Notes (Signed)
Patient transported to MRI 

## 2020-09-05 NOTE — ED Notes (Signed)
Pt tolerated PO challenge well, no vomiting or nausea noted.

## 2020-09-05 NOTE — Discharge Instructions (Signed)
Please avoid using cocaine as this is likely causing your blurry vision.  See neurology for follow-up.  Stay hydrated  Return to ER if you have worse dizziness, blurry vision, trouble speaking, weakness.

## 2020-09-05 NOTE — ED Notes (Signed)
Social work notified regarding pt discharge arrangements and will arrange transport. Pt made aware.

## 2020-09-06 ENCOUNTER — Telehealth: Payer: Self-pay

## 2020-09-06 NOTE — Telephone Encounter (Signed)
Transition Care Management Follow-up Telephone Call  Date of discharge and from where: 09/05/2020 from [facility]  How have you been since you were released from the hospital? Patient stated that she is feeling the same as she was at the hospital. She is not having SOB or CP but she is feeling dizzines  Any questions or concerns? No  Items Reviewed:  Did the pt receive and understand the discharge instructions provided? Yes   Medications obtained and verified? Yes   Other? No   Any new allergies since your discharge? No   Dietary orders reviewed? Yes  Do you have support at home? Yes   Functional Questionnaire: (I = Independent and D = Dependent) ADLs: I  Bathing/Dressing- I  Meal Prep- I  Eating- I  Maintaining continence- I  Transferring/Ambulation- I  Managing Meds- I  Follow up appointments reviewed:   PCP Hospital f/u appt confirmed? Yes  Scheduled to see Theresa Plumber, MD on 09/21/2020 @ 02:50pm.  Are transportation arrangements needed? No   If their condition worsens, is the pt aware to call PCP or go to the Emergency Dept.? Yes  Was the patient provided with contact information for the PCP's office or ED? Yes  Was to pt encouraged to call back with questions or concerns? Yes

## 2020-09-21 ENCOUNTER — Other Ambulatory Visit: Payer: Self-pay

## 2020-09-21 ENCOUNTER — Ambulatory Visit: Payer: Medicaid Other | Attending: Internal Medicine | Admitting: Internal Medicine

## 2020-09-21 ENCOUNTER — Encounter: Payer: Self-pay | Admitting: Internal Medicine

## 2020-09-21 ENCOUNTER — Other Ambulatory Visit (HOSPITAL_COMMUNITY)
Admission: RE | Admit: 2020-09-21 | Discharge: 2020-09-21 | Disposition: A | Discharge status: Home / Self Care | Payer: Medicaid Other | Source: Ambulatory Visit | Attending: Internal Medicine | Admitting: Internal Medicine

## 2020-09-21 VITALS — BP 124/80 | HR 60 | Temp 98.2°F | Ht 66.0 in | Wt 163.2 lb

## 2020-09-21 DIAGNOSIS — F17210 Nicotine dependence, cigarettes, uncomplicated: Secondary | ICD-10-CM

## 2020-09-21 DIAGNOSIS — Z124 Encounter for screening for malignant neoplasm of cervix: Secondary | ICD-10-CM | POA: Diagnosis not present

## 2020-09-21 DIAGNOSIS — I10 Essential (primary) hypertension: Secondary | ICD-10-CM

## 2020-09-21 DIAGNOSIS — Z0000 Encounter for general adult medical examination without abnormal findings: Secondary | ICD-10-CM

## 2020-09-21 DIAGNOSIS — F1410 Cocaine abuse, uncomplicated: Secondary | ICD-10-CM

## 2020-09-21 DIAGNOSIS — E782 Mixed hyperlipidemia: Secondary | ICD-10-CM | POA: Diagnosis not present

## 2020-09-21 DIAGNOSIS — Z113 Encounter for screening for infections with a predominantly sexual mode of transmission: Secondary | ICD-10-CM | POA: Insufficient documentation

## 2020-09-21 DIAGNOSIS — F321 Major depressive disorder, single episode, moderate: Secondary | ICD-10-CM

## 2020-09-21 DIAGNOSIS — E890 Postprocedural hypothyroidism: Secondary | ICD-10-CM

## 2020-09-21 DIAGNOSIS — F17200 Nicotine dependence, unspecified, uncomplicated: Secondary | ICD-10-CM

## 2020-09-21 DIAGNOSIS — Z1231 Encounter for screening mammogram for malignant neoplasm of breast: Secondary | ICD-10-CM | POA: Diagnosis not present

## 2020-09-21 NOTE — Progress Notes (Signed)
Patient ID: Theresa Watkins, female    DOB: [birth year]  MRN: [id number]  CC: Annual Exam and Gynecologic Exam   Subjective: Theresa Watkins is a 54 y.o. female who presents for annual exam Her concerns today include:  Patient with history of HTN, HL,CVA(LT basal ganglia hemorrhagic infarct 12/2015),hypothyroidism(hx of thyroid CA 2011), GERD, anxious mood, tob dep  Pt is G3P1.  1 ectopic, 1 abortion She is requesting STD screening.  She has 1 female partner and uses condoms but states the condom broke about 3 days ago.  She is not having any discharge or vaginal itching at this time.   Due for MMG Family history of breast cancer in her maternal GGM  Had hysterectomy due to heavy periods in early 2000.  She is not even sure that it was a hysterectomy.  She is not sure if her cervix was removed.  HTN:  Seen in ER 2 wks with blurred vision and was evaluated for stroke.  She had MRI which did not reveal any acute stroke.  MRA revealed negative intracranial stenosis of large vessels.  Did show diffuse small vessel atheromatous irregularities throughout the intracranial circulation.  MRA of the neck was negative for critical flow-limiting stenosis.  She tested positive for cocaine in the urine.  She tells me that she uses cocaine about twice a month to give her energy.  Was off Norvasc for 1-2 yrs because BP was good BP in ER 137/86. Started taking Norvasc 1/2 5 mg QOD Checks BP at home since ER visit and reports that her readings have been good Taking Lipitor and ASA  Taking thyroid med daily.  Denies any palpitations or unexplained weight changes.  Tob dep:  2-3 cig/day. Less than last yr.  Ready to quit. Patches causes her to itch  Pos Depression screen: She reports being depressed for yrs and nothing has been done about it.  Would like therpy.  No SI  HM:  Decline flu. Due for Tdap Patient Active Problem List   Diagnosis Date Noted  . Left foot pain 08/26/2019  . Primary  osteoarthritis of right shoulder 04/09/2019  . Tobacco dependence 04/09/2019  . Vitamin D deficiency 02/18/2018  . Toe deformity, acquired, right 02/18/2018  . 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  . Hyperlipemia 03/21/2017  . Headache 03/21/2017  . Tooth pain   . CVA (cerebral infarction) 12/27/2015  . HLD (hyperlipidemia)   . Hemiparesis affecting dominant side as late effect of stroke ([place])   . Tobacco abuse   . Cocaine abuse ([place])   . ETOH abuse   . Postoperative hypothyroidism 12/24/2015  . Drug abuse, cocaine type ([name]) 05/09/2014  . Hx of thyroid cancer 01/25/2014  . Essential hypertension 06/03/2012     Current Outpatient Medications on File Prior to Visit  Medication Sig Dispense Refill  . aspirin 325 MG tablet Take 325 mg by mouth daily.    Marland Kitchen atorvastatin (LIPITOR) 20 MG tablet Take 1 tablet (20 mg total) by mouth daily. 30 tablet 3  . Blood Pressure Monitor DEVI Use as directed to check home blood pressure 2-3 times a week 1 Device 0  . gabapentin (NEURONTIN) 300 MG capsule Take 1 capsule (300 mg total) by mouth at bedtime. (Patient taking differently: Take 300 mg by mouth as needed (nerve pain). ) 30 capsule 3  . levothyroxine (SYNTHROID) 112 MCG tablet TAKE 1 TABLET BY MOUTH DAILY BEFORE BREAKFAST. 30 tablet 1  . Vitamin D, Ergocalciferol, (DRISDOL) 1.25  MG (50000 UNIT) CAPS capsule TAKE 1 CAPSULE BY MOUTH EVERY 7 DAYS (Patient taking differently: Take 50,000 Units by mouth every 7 (seven) days. TAKE 1 CAPSULE BY MOUTH EVERY 7 DAYS on Wednesday) 12 capsule 0  . cetirizine (ZYRTEC) 10 MG tablet Take 1 tablet (10 mg total) by mouth daily. (Patient not taking: Reported on 09/05/2020) 30 tablet 6  . vitamin C (ASCORBIC ACID) 500 MG tablet Take 500 mg by mouth daily. (Patient not taking: Reported on 09/05/2020)     No current facility-administered medications on file prior to visit.    Allergies  Allergen Reactions  . Gadolinium Derivatives Hives, Itching  and Swelling    After MultiHance (gadolinium) injection, pt began sneezing.  After exam, pt told MR tech Burna Mortimer that she was itching.  Dr. Owens Shark evaluated pt, and noticed hives on pt's back.  Pt said that she felt swelling in her throat.  Dr. Owens Shark directed that pt be taken to hospital via ambulance.    . Iohexol Hives, Itching and Other (See Comments)     Code: HIVES, Desc: pts tongue began itching post injection and throat burning, Onset Date: [id number]   . Proanthocyanidin Swelling    Swelling of the tongue  . Grapeseed Extract [Nutritional Supplements]     Tongue swelll  . Diphenhydramine Hcl Rash  . Tramadol Rash    Social History   Socioeconomic History  . Marital status: Divorced    Spouse name: Not on file  . Number of children: 1  . Years of education: Not on file  . Highest education level: Not on file  Occupational History  . Occupation: disabled  Tobacco Use  . Smoking status: Light Tobacco Smoker    Packs/day: 0.10    Years: 25.00    Pack years: 2.50    Types: Cigarettes  . Smokeless tobacco: Never Used  . Tobacco comment: smoke 2 a day trying to quit  Vaping Use  . Vaping Use: Never used  Substance and Sexual Activity  . Alcohol use: Yes    Alcohol/week: 0.0 standard drinks    Comment: occ  . Drug use: Yes    Types: Cocaine    Comment: 1-2x monthly  . Sexual activity: Not Currently    Birth control/protection: Surgical  Other Topics Concern  . Not on file  Social History Narrative  . Not on file   Social Determinants of Health   Financial Resource Strain:   . Difficulty of Paying Living Expenses: Not on file  Food Insecurity:   . Worried About Charity fundraiser in the Last Year: Not on file  . Ran Out of Food in the Last Year: Not on file  Transportation Needs:   . Lack of Transportation (Medical): Not on file  . Lack of Transportation (Non-Medical): Not on file  Physical Activity:   . Days of Exercise per Week: Not on file  . Minutes of  Exercise per Session: Not on file  Stress:   . Feeling of Stress : Not on file  Social Connections:   . Frequency of Communication with Friends and Family: Not on file  . Frequency of Social Gatherings with Friends and Family: Not on file  . Attends Religious Services: Not on file  . Active Member of Clubs or Organizations: Not on file  . Attends Archivist Meetings: Not on file  . Marital Status: Not on file  Intimate Partner Violence:   . Fear of Current or Ex-Partner: Not on file  .  Emotionally Abused: Not on file  . Physically Abused: Not on file  . Sexually Abused: Not on file    Family History  Problem Relation Age of Onset  . Bell's palsy Mother   . Hypertension Mother   . Diabetes Mother   . Stroke Father   . Prostate cancer Father   . Breast cancer Cousin   . Hypertension Sister   . Colon cancer Maternal Aunt   . Colon cancer Paternal Aunt   . Diverticulitis Sister   . Diabetes Sister     Past Surgical History:  Procedure Laterality Date  . ABDOMINAL HYSTERECTOMY    . CESAREAN SECTION    . goiter     removed  . laparoscopic  1988   removal  of ectopic preg, ruptured tube  . THERAPEUTIC ABORTION    . THYROIDECTOMY  march 2011   cancer    ROS: Review of Systems Negative except as stated above  PHYSICAL EXAM: BP 124/80   Pulse 60   Temp 98.2 F (36.8 C) (Oral)   Ht 5\' 6"  (1.676 m)   Wt 163 lb 3.2 oz (74 kg)   SpO2 96%   BMI 26.34 kg/m   Wt Readings from Last 3 Encounters:  09/21/20 163 lb 3.2 oz (74 kg)  09/05/20 176 lb (79.8 kg)  05/18/20 164 lb (74.4 kg)    Physical Exam  General appearance - alert, well appearing, and in no distress Mental status - normal mood, behavior, speech, dress, motor activity, and thought processes Eyes - pupils equal and reactive, extraocular eye movements intact Nose - normal and patent, no erythema, discharge or polyps Mouth - mucous membranes moist, pharynx normal without lesions Neck - supple, no  significant adenopathy Lymphatics - no palpable lymphadenopathy, no hepatosplenomegaly Chest - clear to auscultation, no wheezes, rales or rhonchi, symmetric air entry Heart - normal rate, regular rhythm, normal S1, S2, no murmurs, rubs, clicks or gallops Abdomen - soft, nontender, nondistended, no masses or organomegaly Breasts -CMA Magdalen Spatz present for both breast and pelvic exams: Breasts appear normal, no suspicious masses, no skin or nipple changes or axillary nodes Pelvic -no external vaginal lesion.  She has a piece of condom in the vagina.  This was removed using a large cotton swab.  Does not appear to have a cervix.  Small amount of white discharge in the vaginal vault.   Extremities - peripheral pulses normal, no pedal edema, no clubbing or cyanosis  Depression screen [facility] 2/9 09/21/2020 08/26/2019 08/26/2019  Decreased Interest 2 2 0  Down, Depressed, Hopeless 2 1 0  PHQ - 2 Score 4 3 0  Altered sleeping 2 3 -  Tired, decreased energy 2 1 -  Change in appetite 2 0 -  Feeling bad or failure about yourself  2 1 -  Trouble concentrating 2 1 -  Moving slowly or fidgety/restless 2 1 -  Suicidal thoughts 2 0 -  PHQ-9 Score 18 10 -   GAD 7 : Generalized Anxiety Score 09/21/2020 08/26/2019 06/30/2018 11/06/2017  Nervous, Anxious, on Edge 0 1 0 0  Control/stop worrying 0 1 0 1  Worry too much - different things 0 0 0 1  Trouble relaxing 0 0 2 0  Restless 0 1 0 0  Easily annoyed or irritable 0 1 1 3   Afraid - awful might happen 0 2 1 2   Total GAD 7 Score 0 6 4 7       CMP Latest Ref Rng & Units 09/05/2020  09/05/2020 08/26/2019  Glucose 70 - 99 mg/dL 88 89 87  BUN 6 - 20 mg/dL 16 12 12   Creatinine 0.44 - 1.00 mg/dL 0.80 0.91 0.75  Sodium 135 - 145 mmol/L 136 139 143  Potassium 3.5 - 5.1 mmol/L 4.4 3.8 4.2  Chloride 98 - 111 mmol/L 111 105 103  CO2 22 - 32 mmol/L - 24 25  Calcium 8.9 - 10.3 mg/dL - 8.6(L) 8.7  Total Protein 6.5 - 8.1 g/dL - 6.8 6.9  Total Bilirubin 0.3 - 1.2  mg/dL - 0.6 0.3  Alkaline Phos 38 - 126 U/L - 61 80  AST 15 - 41 U/L - 13(L) 14  ALT 0 - 44 U/L - 12 10   Lipid Panel     Component Value Date/Time   CHOL 220 (H) 08/26/2019 1154   TRIG 106 08/26/2019 1154   HDL 61 08/26/2019 1154   CHOLHDL 3.6 08/26/2019 1154   CHOLHDL 3.7 12/26/2015 0408   VLDL 31 12/26/2015 0408   LDLCALC 140 (H) 08/26/2019 1154    CBC    Component Value Date/Time   WBC 5.8 09/05/2020 1142   RBC 5.45 (H) 09/05/2020 1142   HGB 16.3 (H) 09/05/2020 1158   HGB 15.5 04/10/2020 1140   HCT 48.0 (H) 09/05/2020 1158   HCT 47.2 (H) 04/10/2020 1140   PLT 199 09/05/2020 1142   PLT 207 08/26/2019 1154   MCV 89.9 09/05/2020 1142   MCV 86 04/10/2020 1140   MCH 28.3 09/05/2020 1142   MCHC 31.4 09/05/2020 1142   RDW 14.6 09/05/2020 1142   RDW 13.3 04/10/2020 1140   LYMPHSABS 2.0 09/05/2020 1142   LYMPHSABS 2.4 04/10/2020 1140   MONOABS 0.4 09/05/2020 1142   EOSABS 0.1 09/05/2020 1142   EOSABS 0.1 04/10/2020 1140   BASOSABS 0.1 09/05/2020 1142   BASOSABS 0.0 04/10/2020 1140    ASSESSMENT AND PLAN:  1. Annual physical exam Discussed and encourage healthy eating habits and regular exercise.  2. Encounter for screening mammogram for malignant neoplasm of breast - MM Digital Screening; Future  3. Essential hypertension At goal.  She can continue taking the Norvasc 2.5 mg every other day  4. Tobacco dependence Advised to quit.  Discussed health risks associated with smoking.  Not wanting to try any medication at this time.  She states she will continue to cut down until she quits.  Encouraged her to set a quit date.  Less than 5 minutes spent on counseling.  5. Cocaine abuse, episodic ([place]) Advised to quit.  Recommend considering treatment program.  Patient declines  6. Mixed hyperlipidemia Continue atorvastatin and aspirin to decrease cardiovascular risks - Lipid panel  7. Screen for STD (sexually transmitted disease) We will no longer need Pap smears  after this as she does not have a cervix. - Cervicovaginal ancillary only - HIV Antibody (routine testing w rflx)  8. Moderate major depression ([place]) - Ambulatory referral to Psychiatry  9. Postoperative hypothyroidism - TSH  10. Pap smear for cervical cancer screening - Cytology - PAP   Patient was given the opportunity to ask questions.  Patient verbalized understanding of the plan and was able to repeat key elements of the plan.   Orders Placed This Encounter  Procedures  . MM Digital Screening  . Lipid panel  . TSH  . HIV Antibody (routine testing w rflx)  . Ambulatory referral to Psychiatry     Requested Prescriptions    No prescriptions requested or ordered in this encounter  Return in about 4 months (around 01/19/2021).  Karle Plumber, MD, FACP

## 2020-09-22 LAB — LIPID PANEL
Chol/HDL Ratio: 3.5 ratio (ref 0.0–4.4)
Cholesterol, Total: 211 mg/dL — ABNORMAL HIGH (ref 100–199)
HDL: 60 mg/dL (ref >39)
LDL Chol Calc (NIH): 132 mg/dL — ABNORMAL HIGH (ref 0–99)
Triglycerides: 107 mg/dL (ref 0–149)
VLDL Cholesterol Cal: 19 mg/dL (ref 5–40)

## 2020-09-22 LAB — HIV ANTIBODY (ROUTINE TESTING W REFLEX): HIV Screen 4th Generation wRfx: NONREACTIVE

## 2020-09-22 LAB — TSH: TSH: 1.13 u[IU]/mL (ref 0.450–4.500)

## 2020-09-23 ENCOUNTER — Other Ambulatory Visit: Payer: Self-pay | Admitting: Internal Medicine

## 2020-09-23 DIAGNOSIS — E782 Mixed hyperlipidemia: Secondary | ICD-10-CM

## 2020-09-23 MED ORDER — ATORVASTATIN CALCIUM 20 MG PO TABS: 20.0000 mg | ORAL_TABLET | Freq: Every day | ORAL | 6 refills | Status: DC

## 2020-09-25 ENCOUNTER — Telehealth: Payer: Self-pay | Admitting: Internal Medicine

## 2020-09-25 ENCOUNTER — Telehealth: Payer: Self-pay

## 2020-09-25 ENCOUNTER — Other Ambulatory Visit: Payer: Self-pay | Admitting: Internal Medicine

## 2020-09-25 LAB — CERVICOVAGINAL ANCILLARY ONLY
Bacterial Vaginitis (gardnerella): POSITIVE — AB
Candida Glabrata: POSITIVE — AB
Candida Vaginitis: NEGATIVE
Chlamydia: NEGATIVE
Comment: NEGATIVE
Comment: NEGATIVE
Comment: NEGATIVE
Comment: NEGATIVE
Comment: NEGATIVE
Comment: NORMAL
Neisseria Gonorrhea: NEGATIVE
Trichomonas: NEGATIVE

## 2020-09-25 MED ORDER — FLUCONAZOLE 150 MG PO TABS: 150.0000 mg | ORAL_TABLET | Freq: Once | ORAL | 0 refills | Status: DC

## 2020-09-25 MED ORDER — METRONIDAZOLE 500 MG PO TABS: 500.0000 mg | ORAL_TABLET | Freq: Two times a day (BID) | ORAL | 0 refills | Status: DC

## 2020-09-25 NOTE — Telephone Encounter (Signed)
Patient name and DOB has been verified Patient was informed of lab results. Patient had no questions.  

## 2020-09-25 NOTE — Telephone Encounter (Signed)
-----   Message from Ladell Pier, MD sent at 09/23/2020 11:33 AM EDT ----- Let patient know that her LDL cholesterol is 132 with goal being less than 70.  Please make sure that she takes the atorvastatin every day.  Have sent an updated prescription to the pharmacy.  Her thyroid level is normal.  HIV test is negative.

## 2020-09-25 NOTE — Telephone Encounter (Signed)
Copied from [place] [id number]. Topic: General - Other >> Sep 25, 2020 12:12 PM Celene Kras wrote: Reason for CRM: Pt called and is requesting to have a nurse give her a call back with her lab results. Please advise.

## 2020-09-26 LAB — CYTOLOGY - PAP
Comment: NEGATIVE
Diagnosis: NEGATIVE
High risk HPV: NEGATIVE

## 2020-09-26 MED FILL — metroNIDAZOLE 500 MG TABS: 500 | 7 days supply | Qty: 14 | Fill #0

## 2020-09-26 MED FILL — FLUCONAZOLE 150 MG TABLET: 150 | 1 days supply | Qty: 1 | Fill #0

## 2020-09-26 NOTE — Telephone Encounter (Signed)
Pt was called with results on 11/8 at 154pm

## 2020-10-05 MED FILL — FLUCONAZOLE 150 MG TABLET: 150 | 1 days supply | Qty: 1 | Fill #0

## 2020-10-05 MED FILL — metroNIDAZOLE 500 MG TABS: 500 | 7 days supply | Qty: 14 | Fill #0

## 2020-10-05 MED FILL — ATORVASTATIN CALCIUM 20 MG: 20 | 30 days supply | Qty: 30 | Fill #0

## 2020-10-05 MED FILL — LEVOTHYROXINE 112 MCG TAB: 112 | 30 days supply | Qty: 30 | Fill #0

## 2020-10-06 ENCOUNTER — Telehealth: Payer: Self-pay | Admitting: Internal Medicine

## 2020-10-06 ENCOUNTER — Telehealth: Payer: Self-pay

## 2020-10-06 NOTE — Telephone Encounter (Signed)
Called pt made aware of MD results note and instructions . Verbalized understanding    Let patient know that her LDL cholesterol is 132 with goal being less than 70. Please make sure that she takes the atorvastatin every day. Have sent an updated prescription to the pharmacy. Her thyroid level is normal. HIV test is negative.

## 2020-10-06 NOTE — Telephone Encounter (Signed)
Patient called to speak with the doctor or nurse regarding her physical results.  She stated she does not have access to My Chart or a computer.  Please call patient to discuss at [phone]

## 2020-10-06 NOTE — Telephone Encounter (Signed)
Pap smear is normal.   Theresa Pier, MD  09/25/2020 10:03 PM EST     Let pt know that she tested positive for vaginal yeast and Bacterial Vaginosis.  BV is due to overgrowth of normal vaginal bacteria.  Test for Chlamydia, Gonorrhea and Trichomonas negative.  Rxn sent to our pharmacy for Diflucan to treat the yeast and antibiotic called Flagyl to treat the BV.  Called pt made aware of results and md rezults note

## 2020-10-24 ENCOUNTER — Ambulatory Visit: Payer: Medicaid Other | Admitting: Internal Medicine

## 2020-10-24 ENCOUNTER — Other Ambulatory Visit: Payer: Self-pay | Admitting: Internal Medicine

## 2020-10-24 NOTE — Telephone Encounter (Signed)
Medication Refill - Medication: Metronidazole   Has the patient contacted their pharmacy? Yes.   PT states that she threw out the last prescription on accident. Please advise.  (Agent: If no, request that the patient contact the pharmacy for the refill.) (Agent: If yes, when and what did the pharmacy advise?)  Preferred Pharmacy (with phone number or street name):  [facility], [address]  [place] [address]  Phone: [phone] Fax: [phone]  Hours: Not open 24 hours     Agent: Please be advised that RX refills may take up to 3 business days. We ask that you follow-up with your pharmacy.

## 2020-10-24 NOTE — Telephone Encounter (Signed)
Please see request below. Last refill on 09/25/20 #14. Per PT patient accidentally threw away last prescription.

## 2020-10-25 ENCOUNTER — Other Ambulatory Visit: Payer: Self-pay | Admitting: Internal Medicine

## 2020-10-25 MED ORDER — METRONIDAZOLE 500 MG PO TABS
500.0000 mg | ORAL_TABLET | Freq: Two times a day (BID) | ORAL | 0 refills | Status: DC
Start: 2020-10-25 — End: 2020-12-07

## 2020-10-25 MED FILL — metroNIDAZOLE 500 MG TABS: 500 | 7 days supply | Qty: 14 | Fill #0

## 2020-10-30 MED FILL — LEVOTHYROXINE 112 MCG TAB: 112 | 30 days supply | Qty: 30 | Fill #1

## 2020-11-15 ENCOUNTER — Ambulatory Visit: Payer: Medicaid Other | Admitting: Physician Assistant

## 2020-11-15 ENCOUNTER — Other Ambulatory Visit: Payer: Self-pay | Admitting: Internal Medicine

## 2020-11-15 DIAGNOSIS — E559 Vitamin D deficiency, unspecified: Secondary | ICD-10-CM

## 2020-11-15 MED FILL — LEVOTHYROXINE 112 MCG TAB: 112 | 30 days supply | Qty: 30 | Fill #1

## 2020-11-15 MED FILL — ATORVASTATIN CALCIUM 20 MG: 20 | 30 days supply | Qty: 30 | Fill #1

## 2020-11-15 NOTE — Telephone Encounter (Signed)
Requested medication (s) are due for refill today: yes  Requested medication (s) are on the active medication list: yes  Last refill:  08/17/20 #12  Future visit scheduled: yes  Notes to clinic:  Please review for refill. Refill not delegated per protocol.    Requested Prescriptions  Pending Prescriptions Disp Refills   Vitamin D, Ergocalciferol, (DRISDOL) 1.25 MG (50000 UNIT) CAPS capsule [Pharmacy Med Name: VIT D2 1.25 MG (50,000 UNIT 1.25 MG Capsule] 12 capsule 0    Sig: TAKE 1 CAPSULE BY MOUTH EVERY 7 DAYS      Endocrinology:  Vitamins - Vitamin D Supplementation Failed - 11/15/2020 11:03 AM      Failed - 50,000 IU strengths are not delegated      Failed - Ca in normal range and within 360 days    Calcium  Date Value Ref Range Status  09/05/2020 8.6 (L) 8.9 - 10.3 mg/dL Final   Calcium, Ion  Date Value Ref Range Status  09/05/2020 0.74 (LL) 1.15 - 1.40 mmol/L Final          Failed - Phosphate in normal range and within 360 days    No results found for: PHOS        Failed - Vitamin D in normal range and within 360 days    Vit D, 25-Hydroxy  Date Value Ref Range Status  04/05/2019 15.9 (L) 30.0 - 100.0 ng/mL Final    Comment:    Vitamin D deficiency has been defined by the Institute of Medicine and an Endocrine Society practice guideline as a level of serum 25-OH vitamin D less than 20 ng/mL (1,2). The Endocrine Society went on to further define vitamin D insufficiency as a level between 21 and 29 ng/mL (2). 1. IOM (Institute of Medicine). 2010. Dietary reference    intakes for calcium and D. [place]: The    [employer]. 2. Holick MF, Binkley [place], Bischoff-Ferrari HA, et al.    Evaluation, treatment, and prevention of vitamin D    deficiency: an Endocrine Society clinical practice    guideline. JCEM. 2011 Jul; 96(7):1911-30.           Passed - Valid encounter within last 12 months    Recent Outpatient Visits           1 month ago Annual  physical exam   [facility] Marcine Matar, MD   7 months ago Rectal bleeding   [facility] [place], [place], NP   11 months ago Bilateral lower abdominal discomfort   [facility] [place], [place], NP   1 year ago Left foot pain   [facility] Marcine Matar, MD   1 year ago Essential hypertension   [facility] Marcine Matar, MD       Future Appointments             In 3 weeks Sharon Seller, [place]

## 2020-11-16 MED FILL — VIT D2 1.25 MG (50,000 UNIT: 1.25 MG | 84 days supply | Qty: 12 | Fill #0

## 2020-12-06 ENCOUNTER — Other Ambulatory Visit: Payer: Self-pay | Admitting: Physician Assistant

## 2020-12-06 ENCOUNTER — Other Ambulatory Visit: Payer: Self-pay

## 2020-12-06 ENCOUNTER — Other Ambulatory Visit (HOSPITAL_COMMUNITY)
Admission: RE | Admit: 2020-12-06 | Discharge: 2020-12-06 | Disposition: A | Discharge status: Home / Self Care | Payer: Medicaid Other | Source: Ambulatory Visit | Attending: Physician Assistant | Admitting: Physician Assistant

## 2020-12-06 ENCOUNTER — Encounter: Payer: Self-pay | Admitting: Physician Assistant

## 2020-12-06 ENCOUNTER — Ambulatory Visit: Payer: Medicaid Other | Attending: Physician Assistant | Admitting: Physician Assistant

## 2020-12-06 VITALS — BP 129/85 | HR 82 | Resp 16 | Wt 165.2 lb

## 2020-12-06 DIAGNOSIS — Z113 Encounter for screening for infections with a predominantly sexual mode of transmission: Secondary | ICD-10-CM

## 2020-12-06 DIAGNOSIS — N898 Other specified noninflammatory disorders of vagina: Secondary | ICD-10-CM

## 2020-12-06 DIAGNOSIS — E559 Vitamin D deficiency, unspecified: Secondary | ICD-10-CM

## 2020-12-06 DIAGNOSIS — E890 Postprocedural hypothyroidism: Secondary | ICD-10-CM

## 2020-12-06 DIAGNOSIS — M549 Dorsalgia, unspecified: Secondary | ICD-10-CM | POA: Diagnosis not present

## 2020-12-06 DIAGNOSIS — E785 Hyperlipidemia, unspecified: Secondary | ICD-10-CM | POA: Diagnosis not present

## 2020-12-06 MED ORDER — METHOCARBAMOL 500 MG PO TABS: 500.0000 mg | ORAL_TABLET | Freq: Three times a day (TID) | ORAL | 0 refills | Status: DC | PRN

## 2020-12-06 MED ORDER — PRAVASTATIN SODIUM 20 MG PO TABS: 20.0000 mg | ORAL_TABLET | Freq: Every day | ORAL | 3 refills | Status: DC

## 2020-12-06 MED ORDER — LEVOTHYROXINE SODIUM 112 MCG PO TABS: ORAL_TABLET | ORAL | 2 refills | Status: DC

## 2020-12-06 MED FILL — PRAVASTATIN SODIUM 20 MG TA: 20 | 90 days supply | Qty: 90 | Fill #0

## 2020-12-06 MED FILL — METHOCARBAMOL 500 MG TABS: 500 | 30 days supply | Qty: 90 | Fill #0

## 2020-12-06 NOTE — Progress Notes (Signed)
Patient ID: KILYNN FITZSIMMONS, female   DOB: Jun 15, 1966, 55 y.o.   MRN: [id number]     Theresa Watkins, is a 55 y.o. female  KGM:[id number]  [id number]  DOB - 1966-09-30  Subjective:  Chief Complaint and HPI: Theresa Watkins is a 55 y.o. female here today with multiple issues.   Not SA-Tested positive for yeast and BV in November 2021.  STD neg.  She wants to be rechecked to make sure it is gone.  Also having pain in upper R thoracic area.  Feels as though something is squeezing her.  She is worried die to her h/o thyroid CA.  The pain comes and goes and has been occurring several months.  Occurs intermittently.  No SOB.  She does have a h/o smoking  Also thinks lipitor makes her feel sick on her stomach so wants to try a different medication.  ROS:   Constitutional:  No f/c, No night sweats, No unexplained weight loss. EENT:  No vision changes, No blurry vision, No hearing changes. No mouth, throat, or ear problems.  Respiratory: No cough, No SOB Cardiac: No CP, no palpitations GI:  No abd pain, No N/V/D. GU: No Urinary s/sx Musculoskeletal: No joint pain Neuro: No headache, no dizziness, no motor weakness.  Skin: No rash Endocrine:  No polydipsia. No polyuria.  Psych: Denies SI/HI  No problems updated.  ALLERGIES: Allergies  Allergen Reactions  . Gadolinium Derivatives Hives, Itching and Swelling    After MultiHance (gadolinium) injection, pt began sneezing.  After exam, pt told MR tech Burna Mortimer that she was itching.  Dr. Owens Shark evaluated pt, and noticed hives on pt's back.  Pt said that she felt swelling in her throat.  Dr. Owens Shark directed that pt be taken to hospital via ambulance.    . Iohexol Hives, Itching and Other (See Comments)     Code: HIVES, Desc: pts tongue began itching post injection and throat burning, Onset Date: 63875643   . Proanthocyanidin Swelling    Swelling of the tongue  . Grapeseed Extract [Nutritional Supplements]     Tongue swelll  .  Diphenhydramine Hcl Rash  . Tramadol Rash    PAST MEDICAL HISTORY: Past Medical History:  Diagnosis Date  . Abnormal Pap smear   . Bronchitis   . Fibroid   . Headache(784.0)   . Ovarian cyst   . Plantar fasciitis   . Seizures ([place])   . Stroke ([place])   . Thyroid cancer ([place]) [address]  . Trichomonas   . Urinary tract infection     MEDICATIONS AT HOME: Prior to Admission medications   Medication Sig Start Date End Date Taking? Authorizing Provider  methocarbamol (ROBAXIN) 500 MG tablet Take 1 tablet (500 mg total) by mouth every 8 (eight) hours as needed for muscle spasms. 12/06/20  Yes Freeman Caldron M, PA-C  pravastatin (PRAVACHOL) 20 MG tablet Take 1 tablet (20 mg total) by mouth daily. 12/06/20  Yes Argentina Donovan, PA-C  aspirin 325 MG tablet Take 325 mg by mouth daily.    [provider]  Blood Pressure Monitor DEVI Use as directed to check home blood pressure 2-3 times a week 04/09/19   Ladell Pier, MD  cetirizine (ZYRTEC) 10 MG tablet Take 1 tablet (10 mg total) by mouth daily. Patient not taking: Reported on 09/05/2020 02/17/17   Alfonse Spruce, FNP  gabapentin (NEURONTIN) 300 MG capsule Take 1 capsule (300 mg total) by mouth at bedtime. Patient taking differently: Take 300 mg by mouth  as needed (nerve pain).  04/12/20   Camillia Herter, NP  levothyroxine (SYNTHROID) 112 MCG tablet TAKE 1 TABLET BY MOUTH DAILY BEFORE BREAKFAST. 12/06/20   Argentina Donovan, PA-C  metroNIDAZOLE (FLAGYL) 500 MG tablet Take 1 tablet (500 mg total) by mouth 2 (two) times daily. 10/25/20   Ladell Pier, MD  vitamin C (ASCORBIC ACID) 500 MG tablet Take 500 mg by mouth daily. Patient not taking: Reported on 09/05/2020    [provider]  Vitamin D, Ergocalciferol, (DRISDOL) 1.25 MG (50000 UNIT) CAPS capsule TAKE 1 CAPSULE BY MOUTH EVERY 7 DAYS 11/16/20   Ladell Pier, MD     Objective:  EXAM:   Vitals:   12/06/20 1541  BP: 129/85  Pulse: 82  Resp: 16   SpO2: 96%  Weight: 165 lb 3.2 oz (74.9 kg)    General appearance : A&OX3. NAD. Non-toxic-appearing HEENT: Atraumatic and Normocephalic.  PERRLA. EOM intact.  Neck: supple, no JVD. No cervical lymphadenopathy. No thyromegaly Chest/Lungs:  Breathing-non-labored, Good air entry bilaterally, breath sounds normal without rales, rhonchi, or wheezing  CVS: S1 S2 regular, no murmurs, gallops, rubs  Trapezius spasm just inside the scapula area on right.  No mass apprecitaed Extremities: Bilateral Lower Ext shows no edema, both legs are warm to touch with = pulse throughout Neurology:  CN II-XII grossly intact, Non focal.   Psych:  TP linear. J/I WNL. Normal speech. Appropriate eye contact and affect.  Skin:  No Rash  Data Review Lab Results  Component Value Date   HGBA1C 5.9 (H) 09/25/2017   HGBA1C 5.6 07/17/2017   HGBA1C 5.8 (H) 12/26/2015      Assessment & Plan   1. Screen for STD (sexually transmitted disease) Denies SA  2. Vaginal discharge Test of cure for BV and yeaST - Cervicovaginal ancillary only  3. Vitamin D deficiency - Vitamin D, 25-hydroxy  4. Postoperative hypothyroidism - Thyroid Panel With TSH - levothyroxine (SYNTHROID) 112 MCG tablet; TAKE 1 TABLET BY MOUTH DAILY BEFORE BREAKFAST.  Dispense: 30 tablet; Refill: 2  5. Hyperlipidemia, unspecified hyperlipidemia type Stop lipitor per patient request and try pravast - pravastatin (PRAVACHOL) 20 MG tablet; Take 1 tablet (20 mg total) by mouth daily.  Dispense: 90 tablet; Refill: 3  6. Upper back pain on right side - methocarbamol (ROBAXIN) 500 MG tablet; Take 1 tablet (500 mg total) by mouth every 8 (eight) hours as needed for muscle spasms.  Dispense: 90 tablet; Refill: 0 - DG Chest 2 View; Future   Patient have been counseled extensively about nutrition and exercise  Return in about 3 months (around 03/06/2021) for pcp.  The patient was given clear instructions to go to ER or return to medical center if  symptoms don't improve, worsen or new problems develop. The patient verbalized understanding. The patient was told to call to get lab results if they haven't heard anything in the next week.     Freeman Caldron, PA-C [facility] [place]   12/06/2020, 4:00 PM

## 2020-12-07 ENCOUNTER — Other Ambulatory Visit: Payer: Self-pay | Admitting: Physician Assistant

## 2020-12-07 ENCOUNTER — Telehealth (INDEPENDENT_AMBULATORY_CARE_PROVIDER_SITE_OTHER): Payer: Self-pay

## 2020-12-07 LAB — THYROID PANEL WITH TSH
Free Thyroxine Index: 2.1 (ref 1.2–4.9)
T3 Uptake Ratio: 24 % (ref 24–39)
T4, Total: 8.7 ug/dL (ref 4.5–12.0)
TSH: 4.18 u[IU]/mL (ref 0.450–4.500)

## 2020-12-07 LAB — CERVICOVAGINAL ANCILLARY ONLY
Bacterial Vaginitis (gardnerella): POSITIVE — AB
Candida Glabrata: POSITIVE — AB
Candida Vaginitis: NEGATIVE
Chlamydia: NEGATIVE
Comment: NEGATIVE
Comment: NEGATIVE
Comment: NEGATIVE
Comment: NEGATIVE
Comment: NEGATIVE
Comment: NORMAL
Neisseria Gonorrhea: NEGATIVE
Trichomonas: NEGATIVE

## 2020-12-07 LAB — VITAMIN D 25 HYDROXY (VIT D DEFICIENCY, FRACTURES): Vit D, 25-Hydroxy: 42.9 ng/mL (ref 30.0–100.0)

## 2020-12-07 MED ORDER — FLUCONAZOLE 150 MG PO TABS: 150.0000 mg | ORAL_TABLET | Freq: Once | ORAL | 0 refills | Status: DC

## 2020-12-07 MED ORDER — METRONIDAZOLE 500 MG PO TABS: 500.0000 mg | ORAL_TABLET | Freq: Two times a day (BID) | ORAL | 0 refills | Status: DC

## 2020-12-07 NOTE — Telephone Encounter (Signed)
Spoke with patient. She verified date of birth. She is aware that current thyroid dose is therapeutic continue same dose. Vitamin D levels normal. Can take OTC vitamin D supplement 2000 units daily which should keep levels normal. She is aware that other results are not back at this time. When they become available she will receive another call to discuss those. She verbalized understanding. Nat Christen, CMA

## 2020-12-07 NOTE — Telephone Encounter (Signed)
-----   Message from Argentina Donovan, [place] sent at 12/07/2020  8:45 AM EST ----- Please call patient.  Her thyroid dose is therapeutic and she can continue the same dose. Her vitamin D level is also normal.  She can get OTC vitamin D and take 2000 units daily and this should keep it at a normal level.  Follow up as planned.  No other test results back at this time.  Thanks, Freeman Caldron, PA-C

## 2020-12-08 ENCOUNTER — Telehealth (INDEPENDENT_AMBULATORY_CARE_PROVIDER_SITE_OTHER): Payer: Self-pay

## 2020-12-08 MED FILL — FLUCONAZOLE 150 MG TABLET: 150 | 1 days supply | Qty: 1 | Fill #0

## 2020-12-08 MED FILL — metroNIDAZOLE 500 MG TABS: 500 | 7 days supply | Qty: 14 | Fill #0

## 2020-12-08 NOTE — Telephone Encounter (Signed)
-----   Message from Argentina Donovan, [place] sent at 12/07/2020  4:00 PM EST ----- Please call patient.  She still has both yeast and bacterial vaginosis.  This shows an imbalance in the vagina;  I have sent her medications.  I recommend she drinks 80-100 ounces water daily. Wear cotton underwear.  Get probiotics over the counter daily.  These are things that can normalize the vaginal bacteria.  Follow up as planned.  Thanks, Freeman Caldron, PA-C

## 2020-12-08 NOTE — Telephone Encounter (Signed)
Patient verified date of birth. She is aware that she is positive for both BV and yeast. This shows an imbalance in the vagina. Medication has been sent. Advised to drink 80-100 ounces of water daily, get OTC probiotic take daily, wear cotton underwear. These are all things that can normalized the vaginal bacteria. She verbalized understanding of all results and did not have any questions. Nat Christen, CMA

## 2020-12-15 ENCOUNTER — Telehealth: Payer: Self-pay | Admitting: Internal Medicine

## 2020-12-15 MED ORDER — MONISTAT 3 COMBO PACK APP 200 & 2 MG-% (9GM) VA KIT: PACK | VAGINAL | 1 refills | Status: DC

## 2020-12-15 MED FILL — LEVOTHYROXINE 112 MCG TAB: 112 | 30 days supply | Qty: 30 | Fill #0

## 2020-12-15 NOTE — Telephone Encounter (Signed)
Will forward to pcp

## 2020-12-15 NOTE — Telephone Encounter (Signed)
Pt stated she went to pick up her meds from the pharmacy a few days ago after her visit and she requested that they put in a request for monistat cream/ pt stated she has a ride to bing her in the next 3o minutes but has not heard back/ please advise asap

## 2021-02-17 ENCOUNTER — Other Ambulatory Visit: Payer: Self-pay

## 2021-03-06 ENCOUNTER — Other Ambulatory Visit: Payer: Self-pay

## 2021-03-06 MED FILL — Levothyroxine Sodium Tab 112 MCG: ORAL | 30 days supply | Qty: 30 | Fill #0 | Status: AC

## 2021-03-08 ENCOUNTER — Other Ambulatory Visit: Payer: Self-pay

## 2021-04-24 ENCOUNTER — Other Ambulatory Visit: Payer: Self-pay

## 2021-04-24 ENCOUNTER — Other Ambulatory Visit: Payer: Self-pay | Admitting: Physician Assistant

## 2021-04-24 DIAGNOSIS — E890 Postprocedural hypothyroidism: Secondary | ICD-10-CM

## 2021-04-24 MED ORDER — LEVOTHYROXINE SODIUM 112 MCG PO TABS
ORAL_TABLET | Freq: Every day | ORAL | 0 refills | Status: DC
  Filled 2021-04-24: qty 30, 30d supply, fill #0

## 2021-04-24 NOTE — Telephone Encounter (Signed)
Requested medication (s) are due for refill today:   Yes  Requested medication (s) are on the active medication list:   Yes  Future visit scheduled:   No   Last ordered: 12/06/2020 #30, 2 refills  Returned because needs new Rx.   Expired.   Requested Prescriptions  Pending Prescriptions Disp Refills   levothyroxine (SYNTHROID) 112 MCG tablet 30 tablet 2    Sig: TAKE 1 TABLET BY MOUTH DAILY BEFORE BREAKFAST.      Endocrinology:  Hypothyroid Agents Failed - 04/24/2021  2:23 PM      Failed - TSH needs to be rechecked within 3 months after an abnormal result. Refill until TSH is due.      Passed - TSH in normal range and within 360 days    TSH  Date Value Ref Range Status  12/06/2020 4.180 0.450 - 4.500 uIU/mL Final          Passed - Valid encounter within last 12 months    Recent Outpatient Visits           4 months ago Screen for STD (sexually transmitted disease)   [place], [place], [place]   7 months ago Annual physical exam   [place], Deborah B, MD   1 year ago Rectal bleeding   [place], [place], NP   1 year ago Bilateral lower abdominal discomfort   [place], [place], NP   1 year ago Left foot pain   [facility] Ladell Pier, MD

## 2021-04-27 ENCOUNTER — Other Ambulatory Visit: Payer: Self-pay

## 2021-06-01 ENCOUNTER — Other Ambulatory Visit: Payer: Self-pay

## 2021-06-01 ENCOUNTER — Ambulatory Visit: Payer: Medicaid Other | Attending: Internal Medicine | Admitting: Internal Medicine

## 2021-06-01 ENCOUNTER — Encounter: Payer: Self-pay | Admitting: Internal Medicine

## 2021-06-01 ENCOUNTER — Other Ambulatory Visit (HOSPITAL_COMMUNITY)
Admission: RE | Admit: 2021-06-01 | Discharge: 2021-06-01 | Disposition: A | Discharge status: Home / Self Care | Payer: Medicaid Other | Source: Ambulatory Visit | Attending: Internal Medicine | Admitting: Internal Medicine

## 2021-06-01 VITALS — BP 132/85 | HR 73 | Resp 16 | Ht 65.0 in | Wt 168.4 lb

## 2021-06-01 DIAGNOSIS — Z7982 Long term (current) use of aspirin: Secondary | ICD-10-CM | POA: Diagnosis not present

## 2021-06-01 DIAGNOSIS — N760 Acute vaginitis: Secondary | ICD-10-CM | POA: Insufficient documentation

## 2021-06-01 DIAGNOSIS — F17200 Nicotine dependence, unspecified, uncomplicated: Secondary | ICD-10-CM

## 2021-06-01 DIAGNOSIS — Z113 Encounter for screening for infections with a predominantly sexual mode of transmission: Secondary | ICD-10-CM | POA: Insufficient documentation

## 2021-06-01 DIAGNOSIS — Z85850 Personal history of malignant neoplasm of thyroid: Secondary | ICD-10-CM | POA: Insufficient documentation

## 2021-06-01 DIAGNOSIS — Z8249 Family history of ischemic heart disease and other diseases of the circulatory system: Secondary | ICD-10-CM | POA: Insufficient documentation

## 2021-06-01 DIAGNOSIS — Z8673 Personal history of transient ischemic attack (TIA), and cerebral infarction without residual deficits: Secondary | ICD-10-CM | POA: Diagnosis not present

## 2021-06-01 DIAGNOSIS — E785 Hyperlipidemia, unspecified: Secondary | ICD-10-CM | POA: Insufficient documentation

## 2021-06-01 DIAGNOSIS — E039 Hypothyroidism, unspecified: Secondary | ICD-10-CM | POA: Insufficient documentation

## 2021-06-01 DIAGNOSIS — I10 Essential (primary) hypertension: Secondary | ICD-10-CM

## 2021-06-01 DIAGNOSIS — Z79899 Other long term (current) drug therapy: Secondary | ICD-10-CM | POA: Insufficient documentation

## 2021-06-01 DIAGNOSIS — L989 Disorder of the skin and subcutaneous tissue, unspecified: Secondary | ICD-10-CM | POA: Insufficient documentation

## 2021-06-01 DIAGNOSIS — G629 Polyneuropathy, unspecified: Secondary | ICD-10-CM

## 2021-06-01 DIAGNOSIS — F17210 Nicotine dependence, cigarettes, uncomplicated: Secondary | ICD-10-CM | POA: Diagnosis not present

## 2021-06-01 DIAGNOSIS — Z79890 Hormone replacement therapy (postmenopausal): Secondary | ICD-10-CM | POA: Diagnosis not present

## 2021-06-01 DIAGNOSIS — Z888 Allergy status to other drugs, medicaments and biological substances status: Secondary | ICD-10-CM | POA: Insufficient documentation

## 2021-06-01 MED ORDER — PRAVASTATIN SODIUM 20 MG PO TABS
ORAL_TABLET | Freq: Every day | ORAL | 3 refills | Status: DC
  Filled 2021-06-01 – 2021-12-25 (×2): qty 90, 90d supply, fill #0

## 2021-06-01 MED ORDER — GABAPENTIN 300 MG PO CAPS
300.0000 mg | ORAL_CAPSULE | Freq: Every day | ORAL | 3 refills | Status: DC
  Filled 2021-06-01: qty 90, 90d supply, fill #0

## 2021-06-01 MED ORDER — AMLODIPINE BESYLATE 5 MG PO TABS
5.0000 mg | ORAL_TABLET | Freq: Every day | ORAL | 5 refills | Status: DC
  Filled 2021-06-01: qty 90, 90d supply, fill #0

## 2021-06-01 NOTE — Progress Notes (Signed)
Pt would like to be checked for yeast

## 2021-06-01 NOTE — Progress Notes (Signed)
Patient ID: Theresa Watkins, female    DOB: [birth year]  MRN: [id number]  CC: Chronic disease management  Subjective: Theresa Watkins is a 55 y.o. female who presents for chronic ds management Her concerns today include:  Patient with history of HTN, HL, CVA (LT basal ganglia hemorrhagic infarct 12/2015), hypothyroidism (hx of thyroid CA 2011), GERD, anxious mood, tob dep, episodic cocaine  Complains of a spot on LT index finger x 1 yr. Started as a cut that healed but scab keeps growing thicker and darker.  Not painful  Gets crawling feeling x 1 mth on one particular spot over RT scapular.  Itches sometimes. No initiating factors.  She has not noted a rash.  Gabapentin on med list but states she has been out of it for a while  Tells me that in the mornings she gets sharp pains through her lower abdomen until she has a BM then she is fine for the rest of the day.  No change in bowel habits or blood in stools  Had vaginal yeast infection recently and wants to do self swab to make sure it is gone.   Agrees for STD check. No new partner  HTN: taking Norvasc 1/2 pill once a wk.  Does not check BP regularly.  Has a device but only checks when she feels BP high.   Only taking Pravachol once a wk also.  Reports she has cut back a lot on smoking.  At 4 cig/day.  Some days none.   Take ASA 325 mg daily.  Pt with hx of CVA Patient Active Problem List   Diagnosis Date Noted   Left foot pain 08/26/2019   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   Tobacco dependence 04/09/2019   Vitamin D deficiency 02/18/2018   Toe deformity, acquired, right 02/18/2018   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   Hyperlipemia 03/21/2017   Headache 03/21/2017   Tooth pain    CVA (cerebral infarction) 12/27/2015   HLD (hyperlipidemia)    Hemiparesis affecting dominant side as late effect of stroke ([place])    Tobacco abuse    Cocaine abuse ([place])    ETOH abuse    Postoperative  hypothyroidism 12/24/2015   Drug abuse, cocaine type ([place]) 05/09/2014   Hx of thyroid cancer 01/25/2014   Essential hypertension 06/03/2012     Current Outpatient Medications on File Prior to Visit  Medication Sig Dispense Refill   aspirin 325 MG tablet Take 325 mg by mouth daily.     levothyroxine (SYNTHROID) 112 MCG tablet TAKE 1 TABLET BY MOUTH DAILY BEFORE BREAKFAST. 30 tablet 0   Blood Pressure Monitor DEVI Use as directed to check home blood pressure 2-3 times a week (Patient not taking: Reported on 06/01/2021) 1 Device 0   vitamin C (ASCORBIC ACID) 500 MG tablet Take 500 mg by mouth daily. (Patient not taking: No sig reported)     Vitamin D, Ergocalciferol, (DRISDOL) 1.25 MG (50000 UNIT) CAPS capsule TAKE 1 CAPSULE BY MOUTH EVERY 7 DAYS (Patient not taking: Reported on 06/01/2021) 12 capsule 0   [DISCONTINUED] atorvastatin (LIPITOR) 20 MG tablet Take 1 tablet (20 mg total) by mouth daily. 30 tablet 6   No current facility-administered medications on file prior to visit.    Allergies  Allergen Reactions   Gadolinium Derivatives Hives, Itching and Swelling    After MultiHance (gadolinium) injection, pt began sneezing.  After exam, pt told MR tech Burna Mortimer that she was itching.  Dr. Owens Shark  evaluated pt, and noticed hives on pt's back.  Pt said that she felt swelling in her throat.  Dr. Owens Shark directed that pt be taken to hospital via ambulance.     Iohexol Hives, Itching and Other (See Comments)     Code: HIVES, Desc: pts tongue began itching post injection and throat burning, Onset Date: 69450388    Proanthocyanidin Swelling    Swelling of the tongue   Grapeseed Extract [Nutritional Supplements]     Tongue swelll   Diphenhydramine Hcl Rash   Tramadol Rash    Social History   Socioeconomic History   Marital status: Divorced    Spouse name: Not on file   Number of children: 1   Years of education: Not on file   Highest education level: Not on file  Occupational History    Occupation: disabled  Tobacco Use   Smoking status: Light Smoker    Packs/day: 0.10    Years: 25.00    Pack years: 2.50    Types: Cigarettes   Smokeless tobacco: Never   Tobacco comments:    smoke 2 a day trying to quit  Vaping Use   Vaping Use: Never used  Substance and Sexual Activity   Alcohol use: Yes    Alcohol/week: 0.0 standard drinks    Comment: occ   Drug use: Yes    Types: Cocaine    Comment: 1-2x monthly   Sexual activity: Not Currently    Birth control/protection: Surgical  Other Topics Concern   Not on file  Social History Narrative   Not on file   Social Determinants of Health   Financial Resource Strain: Not on file  Food Insecurity: Not on file  Transportation Needs: Not on file  Physical Activity: Not on file  Stress: Not on file  Social Connections: Not on file  Intimate Partner Violence: Not on file    Family History  Problem Relation Age of Onset   Bell's palsy Mother    Hypertension Mother    Diabetes Mother    Stroke Father    Prostate cancer Father    Breast cancer Cousin    Hypertension Sister    Colon cancer Maternal Aunt    Colon cancer Paternal Aunt    Diverticulitis Sister    Diabetes Sister     Past Surgical History:  Procedure Laterality Date   ABDOMINAL HYSTERECTOMY     CESAREAN SECTION     goiter     removed   laparoscopic  1988   removal  of ectopic preg, ruptured tube   THERAPEUTIC ABORTION     THYROIDECTOMY  march 2011   cancer    ROS: Review of Systems Negative except as stated above  PHYSICAL EXAM: BP 132/85   Pulse 73   Resp 16   Ht 5\' 5"  (1.651 m)   Wt 168 lb 6.4 oz (76.4 kg)   SpO2 95%   BMI 28.02 kg/m   Physical Exam  General appearance - alert, well appearing, and in no distress Mental status - normal mood, behavior, speech, dress, motor activity, and thought processes Neck - supple, no significant adenopathy Chest - clear to auscultation, no wheezes, rales or rhonchi, symmetric air  entry Heart - normal rate, regular rhythm, normal S1, S2, no murmurs, rubs, clicks or gallops Abdomen - soft, nontender, nondistended, no masses or organomegaly Extremities - peripheral pulses normal, no pedal edema, no clubbing or cyanosis Skin -slightly dry skin on the posterior thorax.  No rash seen over  the right scapula.  No point tenderness. Left index finger: She has a hyperpigmented raised cracked scab-like lesion on the PIP joint dorsal surface.   CMP Latest Ref Rng & Units 09/05/2020 09/05/2020 08/26/2019  Glucose 70 - 99 mg/dL 88 89 87  BUN 6 - 20 mg/dL 16 12 12   Creatinine 0.44 - 1.00 mg/dL 0.80 0.91 0.75  Sodium 135 - 145 mmol/L 136 139 143  Potassium 3.5 - 5.1 mmol/L 4.4 3.8 4.2  Chloride 98 - 111 mmol/L 111 105 103  CO2 22 - 32 mmol/L - 24 25  Calcium 8.9 - 10.3 mg/dL - 8.6(L) 8.7  Total Protein 6.5 - 8.1 g/dL - 6.8 6.9  Total Bilirubin 0.3 - 1.2 mg/dL - 0.6 0.3  Alkaline Phos 38 - 126 U/L - 61 80  AST 15 - 41 U/L - 13(L) 14  ALT 0 - 44 U/L - 12 10   Lipid Panel     Component Value Date/Time   CHOL 211 (H) 09/21/2020 1614   TRIG 107 09/21/2020 1614   HDL 60 09/21/2020 1614   CHOLHDL 3.5 09/21/2020 1614   CHOLHDL 3.7 12/26/2015 0408   VLDL 31 12/26/2015 0408   LDLCALC 132 (H) 09/21/2020 1614    CBC    Component Value Date/Time   WBC 5.8 09/05/2020 1142   RBC 5.45 (H) 09/05/2020 1142   HGB 16.3 (H) 09/05/2020 1158   HGB 15.5 04/10/2020 1140   HCT 48.0 (H) 09/05/2020 1158   HCT 47.2 (H) 04/10/2020 1140   PLT 199 09/05/2020 1142   PLT 207 08/26/2019 1154   MCV 89.9 09/05/2020 1142   MCV 86 04/10/2020 1140   MCH 28.3 09/05/2020 1142   MCHC 31.4 09/05/2020 1142   RDW 14.6 09/05/2020 1142   RDW 13.3 04/10/2020 1140   LYMPHSABS 2.0 09/05/2020 1142   LYMPHSABS 2.4 04/10/2020 1140   MONOABS 0.4 09/05/2020 1142   EOSABS 0.1 09/05/2020 1142   EOSABS 0.1 04/10/2020 1140   BASOSABS 0.1 09/05/2020 1142   BASOSABS 0.0 04/10/2020 1140    ASSESSMENT AND  PLAN: 1. Lesion of finger - Ambulatory referral to Dermatology  2. Neuropathy - gabapentin (NEURONTIN) 300 MG capsule; Take 1 capsule (300 mg total) by mouth at bedtime.  Dispense: 30 capsule; Refill: 3  3. Acute vaginitis - Cervicovaginal ancillary only  4. Essential hypertension Not at goal.  Stressed the importance of good blood pressure control given her history of stroke.  Encouraged her to take Norvasc daily instead of once a week.  I have written the dose for 5 mg daily. - amLODipine (NORVASC) 5 MG tablet; Take 1 tablet (5 mg total) by mouth daily.  Dispense: 30 tablet; Refill: 5  5. Tobacco dependence Strongly advised to quit smoking as part of secondary prevention of stroke and also to prevent other cardiovascular events and cancers associated with cigarette smoking.  Patient states that she is cutting back and will quit eventually.  6. History of CVA (cerebrovascular accident) Continue aspirin.  Encouraged her to take the pravastatin and Norvasc daily. - pravastatin (PRAVACHOL) 20 MG tablet; TAKE 1 TABLET (20 MG TOTAL) BY MOUTH DAILY.  Dispense: 90 tablet; Refill: 3  7. Hyperlipidemia, unspecified hyperlipidemia type - pravastatin (PRAVACHOL) 20 MG tablet; TAKE 1 TABLET (20 MG TOTAL) BY MOUTH DAILY.  Dispense: 90 tablet; Refill: 3  Patient was given the opportunity to ask questions.  Patient verbalized understanding of the plan and was able to repeat key elements of the plan.   Orders  Placed This Encounter  Procedures   Ambulatory referral to Dermatology     Requested Prescriptions   Signed Prescriptions Disp Refills   gabapentin (NEURONTIN) 300 MG capsule 30 capsule 3    Sig: Take 1 capsule (300 mg total) by mouth at bedtime.   pravastatin (PRAVACHOL) 20 MG tablet 90 tablet 3    Sig: TAKE 1 TABLET (20 MG TOTAL) BY MOUTH DAILY.   amLODipine (NORVASC) 5 MG tablet 30 tablet 5    Sig: Take 1 tablet (5 mg total) by mouth daily.    Return in about 4 months (around  10/02/2021).  Karle Plumber, MD, FACP

## 2021-06-04 LAB — CERVICOVAGINAL ANCILLARY ONLY
Bacterial Vaginitis (gardnerella): NEGATIVE
Candida Glabrata: NEGATIVE
Candida Vaginitis: NEGATIVE
Chlamydia: NEGATIVE
Comment: NEGATIVE
Comment: NEGATIVE
Comment: NEGATIVE
Comment: NEGATIVE
Comment: NEGATIVE
Comment: NORMAL
Neisseria Gonorrhea: NEGATIVE
Trichomonas: NEGATIVE

## 2021-06-06 ENCOUNTER — Other Ambulatory Visit: Payer: Self-pay | Admitting: Internal Medicine

## 2021-06-06 ENCOUNTER — Other Ambulatory Visit: Payer: Self-pay

## 2021-06-06 DIAGNOSIS — E890 Postprocedural hypothyroidism: Secondary | ICD-10-CM

## 2021-06-06 NOTE — Telephone Encounter (Signed)
Requested medication (s) are due for refill today- yes  Requested medication (s) are on the active medication list -yes  Future visit scheduled -yes  Last refill: 04/27/21  Notes to clinic: Request RF- patient passes protocol for RF- last lab check advises continue dosing- but last RF has note- must have office visit. Sent for review of request  Requested Prescriptions  Pending Prescriptions Disp Refills   levothyroxine (SYNTHROID) 112 MCG tablet 30 tablet 0    Sig: TAKE 1 TABLET BY MOUTH DAILY BEFORE BREAKFAST.      Endocrinology:  Hypothyroid Agents Failed - 06/06/2021  3:56 PM      Failed - TSH needs to be rechecked within 3 months after an abnormal result. Refill until TSH is due.      Passed - TSH in normal range and within 360 days    TSH  Date Value Ref Range Status  12/06/2020 4.180 0.450 - 4.500 uIU/mL Final          Passed - Valid encounter within last 12 months    Recent Outpatient Visits           5 days ago Lesion of finger   [place], MD   6 months ago Screen for STD (sexually transmitted disease)   [place], [place], [place]   8 months ago Annual physical exam   [place], Deborah B, MD   1 year ago Rectal bleeding   [place], [place], NP   1 year ago Bilateral lower abdominal discomfort   [place], Amy J, NP       Future Appointments             In 3 months Wynetta Emery Dalbert Batman, MD [place]                 Requested Prescriptions  Pending Prescriptions Disp Refills   levothyroxine (SYNTHROID) 112 MCG tablet 30 tablet 0    Sig: TAKE 1 TABLET BY MOUTH DAILY BEFORE BREAKFAST.      Endocrinology:  Hypothyroid Agents Failed - 06/06/2021  3:56 PM      Failed - TSH needs to be rechecked within 3 months after  an abnormal result. Refill until TSH is due.      Passed - TSH in normal range and within 360 days    TSH  Date Value Ref Range Status  12/06/2020 4.180 0.450 - 4.500 uIU/mL Final          Passed - Valid encounter within last 12 months    Recent Outpatient Visits           5 days ago Lesion of finger   [place], MD   6 months ago Screen for STD (sexually transmitted disease)   [place], [place], [place]   8 months ago Annual physical exam   [place] Ladell Pier, MD   1 year ago Rectal bleeding   [place], [place], NP   1 year ago Bilateral lower abdominal discomfort   [place], Amy J, NP       Future Appointments  In 3 months Ladell Pier, MD [place]

## 2021-06-08 ENCOUNTER — Telehealth: Payer: Self-pay

## 2021-06-08 ENCOUNTER — Other Ambulatory Visit: Payer: Self-pay

## 2021-06-08 MED ORDER — LEVOTHYROXINE SODIUM 112 MCG PO TABS
112.0000 ug | ORAL_TABLET | Freq: Every day | ORAL | 2 refills | Status: DC
  Filled 2021-06-08: qty 30, 30d supply, fill #0
  Filled 2021-07-16: qty 30, 30d supply, fill #1
  Filled 2021-09-09: qty 30, 30d supply, fill #2

## 2021-06-08 NOTE — Telephone Encounter (Signed)
Contacted pt to go over lab results pt is aware and doesn't have any questions or concerns 

## 2021-06-18 ENCOUNTER — Ambulatory Visit: Payer: Medicaid Other | Admitting: Critical Care Medicine

## 2021-07-16 ENCOUNTER — Other Ambulatory Visit: Payer: Self-pay

## 2021-07-16 ENCOUNTER — Other Ambulatory Visit: Payer: Self-pay | Admitting: Internal Medicine

## 2021-07-16 DIAGNOSIS — E559 Vitamin D deficiency, unspecified: Secondary | ICD-10-CM

## 2021-07-16 NOTE — Telephone Encounter (Signed)
Requested medications are due for refill today.  yes  Requested medications are on the active medications list.  yes  Last refill. 11/16/2020  Future visit scheduled.   yes  Notes to clinic.  Medication not delegated. Per Med list pt not taking 06/01/2021.

## 2021-07-17 ENCOUNTER — Other Ambulatory Visit: Payer: Self-pay

## 2021-07-31 DIAGNOSIS — A059 Bacterial foodborne intoxication, unspecified: Secondary | ICD-10-CM | POA: Diagnosis not present

## 2021-07-31 DIAGNOSIS — K5900 Constipation, unspecified: Secondary | ICD-10-CM | POA: Diagnosis not present

## 2021-08-01 ENCOUNTER — Other Ambulatory Visit: Payer: Self-pay

## 2021-08-31 ENCOUNTER — Encounter: Payer: Self-pay | Admitting: Nurse Practitioner

## 2021-08-31 ENCOUNTER — Other Ambulatory Visit: Payer: Self-pay

## 2021-08-31 ENCOUNTER — Ambulatory Visit (INDEPENDENT_AMBULATORY_CARE_PROVIDER_SITE_OTHER): Payer: Medicaid Other | Admitting: Nurse Practitioner

## 2021-08-31 DIAGNOSIS — M542 Cervicalgia: Secondary | ICD-10-CM | POA: Diagnosis not present

## 2021-08-31 NOTE — Patient Instructions (Signed)
Neck Pain:  Will order x ray  May try gentle stretching  May try tylenol for pain  May try ice packs  Follow up:  Follow up with PCP as scheduled

## 2021-08-31 NOTE — Progress Notes (Signed)
Virtual Visit via Telephone Note  I connected with Theresa Watkins on 08/31/21 at 10:00 AM EDT by telephone and verified that I am speaking with the correct person using two identifiers.  Location: Patient: home Provider: office   I discussed the limitations, risks, security and privacy concerns of performing an evaluation and management service by telephone and the availability of in person appointments. I also discussed with the patient that there may be a patient responsible charge related to this service. The patient expressed understanding and agreed to proceed.   History of Present Illness:  Patient presents today for televisit for neck pain.  Patient states that she has been having bilateral neck pain for the past 3 months.  She states that it is just not improving.  She feels the pain on both sides of her neck going down into her muscles across her shoulders.  Patient states that she does feel knots in her muscles.  She declines any muscle relaxer today.  We discussed that we can get an x-ray to further evaluate the neck pain.  Patient does have an appointment already scheduled with Dr. Wynetta Emery in November and can follow-up on this with her at that time. Denies f/c/s, n/v/d, hemoptysis, PND, chest pain or edema.      Observations/Objective:  Vitals with BMI 06/01/2021 06/01/2021 12/06/2020  Height - 5\' 5"  -  Weight - 168 lbs 6 oz 165 lbs 3 oz  BMI - 99.77 -  Systolic 414 239 532  Diastolic 85 89 85  Pulse - 73 82      Assessment and Plan:  Neck Pain:  Will order x ray  May try gentle stretching  May try tylenol for pain  May try ice packs  Follow up:  Follow up with PCP as scheduled    I discussed the assessment and treatment plan with the patient. The patient was provided an opportunity to ask questions and all were answered. The patient agreed with the plan and demonstrated an understanding of the instructions.   The patient was advised to call back or seek  an in-person evaluation if the symptoms worsen or if the condition fails to improve as anticipated.  I provided 23 minutes of non-face-to-face time during this encounter.   Fenton Foy, NP

## 2021-09-03 ENCOUNTER — Other Ambulatory Visit: Payer: Self-pay

## 2021-09-03 ENCOUNTER — Ambulatory Visit
Admission: RE | Admit: 2021-09-03 | Discharge: 2021-09-03 | Disposition: A | Discharge status: Home / Self Care | Payer: Medicaid Other | Source: Ambulatory Visit | Attending: Nurse Practitioner | Admitting: Nurse Practitioner

## 2021-09-03 DIAGNOSIS — M542 Cervicalgia: Secondary | ICD-10-CM | POA: Diagnosis not present

## 2021-09-05 ENCOUNTER — Other Ambulatory Visit: Payer: Self-pay | Admitting: Nurse Practitioner

## 2021-09-05 DIAGNOSIS — M5030 Other cervical disc degeneration, unspecified cervical region: Secondary | ICD-10-CM

## 2021-09-10 ENCOUNTER — Other Ambulatory Visit: Payer: Self-pay

## 2021-09-25 ENCOUNTER — Ambulatory Visit: Payer: Medicaid Other | Admitting: Orthopaedic Surgery

## 2021-09-25 ENCOUNTER — Ambulatory Visit (INDEPENDENT_AMBULATORY_CARE_PROVIDER_SITE_OTHER): Payer: Medicaid Other

## 2021-09-25 ENCOUNTER — Other Ambulatory Visit: Payer: Self-pay

## 2021-09-25 ENCOUNTER — Encounter: Payer: Self-pay | Admitting: Orthopaedic Surgery

## 2021-09-25 VITALS — BP 122/80 | HR 72 | Ht 65.5 in | Wt 172.0 lb

## 2021-09-25 DIAGNOSIS — M2242 Chondromalacia patellae, left knee: Secondary | ICD-10-CM | POA: Diagnosis not present

## 2021-09-25 DIAGNOSIS — M545 Low back pain, unspecified: Secondary | ICD-10-CM

## 2021-09-25 DIAGNOSIS — G8929 Other chronic pain: Secondary | ICD-10-CM

## 2021-09-25 DIAGNOSIS — M1612 Unilateral primary osteoarthritis, left hip: Secondary | ICD-10-CM | POA: Diagnosis not present
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
## 2021-10-01 ENCOUNTER — Other Ambulatory Visit: Payer: Self-pay

## 2021-10-01 ENCOUNTER — Ambulatory Visit: Payer: Medicaid Other | Admitting: Internal Medicine

## 2021-10-01 ENCOUNTER — Ambulatory Visit: Payer: Medicaid Other | Attending: Internal Medicine | Admitting: Internal Medicine

## 2021-10-01 ENCOUNTER — Encounter: Payer: Self-pay | Admitting: Internal Medicine

## 2021-10-01 VITALS — BP 130/88 | HR 85 | Resp 16 | Ht 65.5 in | Wt 168.8 lb

## 2021-10-01 DIAGNOSIS — I10 Essential (primary) hypertension: Secondary | ICD-10-CM

## 2021-10-01 DIAGNOSIS — G8929 Other chronic pain: Secondary | ICD-10-CM

## 2021-10-01 DIAGNOSIS — E890 Postprocedural hypothyroidism: Secondary | ICD-10-CM

## 2021-10-01 DIAGNOSIS — E785 Hyperlipidemia, unspecified: Secondary | ICD-10-CM | POA: Diagnosis not present

## 2021-10-01 DIAGNOSIS — M542 Cervicalgia: Secondary | ICD-10-CM

## 2021-10-01 DIAGNOSIS — Z2821 Immunization not carried out because of patient refusal: Secondary | ICD-10-CM | POA: Diagnosis not present

## 2021-10-01 DIAGNOSIS — Z1231 Encounter for screening mammogram for malignant neoplasm of breast: Secondary | ICD-10-CM | POA: Diagnosis not present

## 2021-10-01 DIAGNOSIS — N3946 Mixed incontinence: Secondary | ICD-10-CM | POA: Diagnosis not present

## 2021-10-01 MED ORDER — DULOXETINE HCL 20 MG PO CPEP
20.0000 mg | ORAL_CAPSULE | Freq: Every day | ORAL | 3 refills | Status: DC
  Filled 2021-10-01: qty 20, 20d supply, fill #0

## 2021-10-01 NOTE — Progress Notes (Signed)
Patient ID: Theresa Watkins, female    DOB: [birth year]  MRN: [id number]  CC: Neck Pain and Referral   Subjective: Theresa Watkins is a 55 y.o. female who presents for chronic ds management Her concerns today include:  Patient with history of HTN, HL, CVA (LT basal ganglia hemorrhagic infarct 12/2015), hypothyroidism (hx of thyroid CA 2011), GERD, anxious mood, tob dep, episodic cocaine    Pt C/o pain and stiffness in neck x 3-4 mths that has progressively gotten worse.  Had an UC visit with a NP for same 1 mth ago.  X-ray of the cervical spine revealed multilevel degenerative disc disease and facet hypertrophy.  The pain is not concentrated or greater on one particular side.   Hurts to turn her head from side to side.  No crunching  in neck.   Can not sleep due to pain.  Pain goes down the back No numbness or tingling in arms.  No weakness in arms.   Rates pain above 10 most of the time. Heating pad makes it worse.  She tells me she was taking a muscle relaxant but I do not see one on her med list. Saw Dr. Lorin Mercy last week for pain in lower back.  Pt states he saw her for her neck pain also. Referred her for P.T but she has not been called as yet.  She is wanting more detailed imaging of the neck  HTN: still not taking Norvasc every day. Checking BP QOD.  Does not have log with her.  Gives range 120-136/highest 89.  She feels when she takes the medication every day it makes her blood pressure go up higher. Taking Pravachol consistently Reports taking levothyroxine consistently.  Requesting incontinence supplies.  Complains of urinary incontinence for years.  When she gets the urge to urinate she is not able to hold it all the time until she gets to the restroom.  She also endorses leakage with coughing and laughing.     HM: declines flu and Pneumonia vaccine.  Reports getting Tdap last yr at [facility] on [address].  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   Unilateral primary osteoarthritis, left hip 09/25/2021   Left foot pain 08/26/2019   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   Tobacco dependence 04/09/2019   Vitamin D deficiency 02/18/2018   Toe deformity, acquired, right 02/18/2018   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   Hyperlipemia 03/21/2017   Headache 03/21/2017   Tooth pain    CVA (cerebral infarction) 12/27/2015   HLD (hyperlipidemia)    Hemiparesis affecting dominant side as late effect of stroke ([place])    Tobacco abuse    Cocaine abuse ([place])    ETOH abuse    Postoperative hypothyroidism 12/24/2015   Drug abuse, cocaine type ([place]) 05/09/2014   Hx of thyroid cancer 01/25/2014   Essential hypertension 06/03/2012     Current Outpatient Medications on File Prior to Visit  Medication Sig Dispense Refill   aspirin 325 MG tablet Take 325 mg by mouth daily.     levothyroxine (SYNTHROID) 112 MCG tablet Take 1 tablet (112 mcg total) by mouth daily before breakfast. 30 tablet 2   pravastatin (PRAVACHOL) 20 MG tablet TAKE 1 TABLET (20 MG TOTAL) BY MOUTH DAILY. 90 tablet 3   amLODipine (NORVASC) 5 MG tablet Take 1 tablet (5 mg total) by mouth daily. (Patient not taking: Reported on 10/01/2021) 30 tablet 5  Blood Pressure Monitor DEVI Use as directed to check home blood pressure 2-3 times a week (Patient not taking: No sig reported) 1 Device 0   gabapentin (NEURONTIN) 300 MG capsule Take 1 capsule (300 mg total) by mouth at bedtime. (Patient not taking: Reported on 10/01/2021) 30 capsule 3   vitamin C (ASCORBIC ACID) 500 MG tablet Take 500 mg by mouth daily. (Patient not taking: No sig reported)     Vitamin D, Ergocalciferol, (DRISDOL) 1.25 MG (50000 UNIT) CAPS capsule TAKE 1 CAPSULE BY MOUTH EVERY 7 DAYS (Patient not taking: No sig reported) 12 capsule 0   [DISCONTINUED] atorvastatin (LIPITOR) 20 MG tablet Take 1 tablet (20 mg total) by mouth daily. 30 tablet 6   No current  facility-administered medications on file prior to visit.    Allergies  Allergen Reactions   Gadolinium Derivatives Hives, Itching and Swelling    After MultiHance (gadolinium) injection, pt began sneezing.  After exam, pt told MR tech Burna Mortimer that she was itching.  Dr. Owens Shark evaluated pt, and noticed hives on pt's back.  Pt said that she felt swelling in her throat.  Dr. Owens Shark directed that pt be taken to hospital via ambulance.     Iohexol Hives, Itching and Other (See Comments)     Code: HIVES, Desc: pts tongue began itching post injection and throat burning, Onset Date: [id number]    Proanthocyanidin Swelling    Swelling of the tongue   Grapeseed Extract [Nutritional Supplements]     Tongue swelll   Diphenhydramine Hcl Rash   Tramadol Rash    Social History   Socioeconomic History   Marital status: Divorced    Spouse name: Not on file   Number of children: 1   Years of education: Not on file   Highest education level: Not on file  Occupational History   Occupation: disabled  Tobacco Use   Smoking status: Light Smoker    Packs/day: 0.10    Years: 25.00    Pack years: 2.50    Types: Cigarettes   Smokeless tobacco: Never   Tobacco comments:    smoke 2 a day trying to quit  Vaping Use   Vaping Use: Never used  Substance and Sexual Activity   Alcohol use: Yes    Alcohol/week: 0.0 standard drinks    Comment: occ   Drug use: Yes    Types: Cocaine    Comment: 1-2x monthly   Sexual activity: Not Currently    Birth control/protection: Surgical  Other Topics Concern   Not on file  Social History Narrative   Not on file   Social Determinants of Health   Financial Resource Strain: Not on file  Food Insecurity: Not on file  Transportation Needs: Not on file  Physical Activity: Not on file  Stress: Not on file  Social Connections: Not on file  Intimate Partner Violence: Not on file    Family History  Problem Relation Age of Onset   Bell's palsy Mother     Hypertension Mother    Diabetes Mother    Stroke Father    Prostate cancer Father    Breast cancer Cousin    Hypertension Sister    Colon cancer Maternal Aunt    Colon cancer Paternal Aunt    Diverticulitis Sister    Diabetes Sister     Past Surgical History:  Procedure Laterality Date   ABDOMINAL HYSTERECTOMY     CESAREAN SECTION     goiter     removed  laparoscopic  1988   removal  of ectopic preg, ruptured tube   THERAPEUTIC ABORTION     THYROIDECTOMY  march 2011   cancer    ROS: Review of Systems Negative except as stated above  PHYSICAL EXAM: BP 130/88   Pulse 85   Resp 16   Ht 5' 5.5" (1.664 m)   Wt 168 lb 12.8 oz (76.6 kg)   SpO2 97%   BMI 27.66 kg/m   Wt Readings from Last 3 Encounters:  10/01/21 168 lb 12.8 oz (76.6 kg)  09/25/21 172 lb (78 kg)  06/01/21 168 lb 6.4 oz (76.4 kg)    Physical Exam  General appearance - alert, well appearing, and in no distress Mental status - normal mood, behavior, speech, dress, motor activity, and thought processes Neck -no cervical lymphadenopathy. Chest - clear to auscultation, no wheezes, rales or rhonchi, symmetric air entry Heart - normal rate, regular rhythm, normal S1, S2, no murmurs, rubs, clicks or gallops Extremities - peripheral pulses normal, no pedal edema, no clubbing or cyanosis Neuro:  Grip dec BL but pt seems not to be putting forth much effort. Power 5/5 distally, 5/5 RT prox, 4/5 LT prox Ues MSK: Mild tenderness on palpation of the cervical spine throughout.  No tenderness in the surrounding muscles and trapezius muscles.  She has mild discomfort with attempted passive range of motion in all directions. CMP Latest Ref Rng & Units 09/05/2020 09/05/2020 08/26/2019  Glucose 70 - 99 mg/dL 88 89 87  BUN 6 - 20 mg/dL 16 12 12   Creatinine 0.44 - 1.00 mg/dL 0.80 0.91 0.75  Sodium 135 - 145 mmol/L 136 139 143  Potassium 3.5 - 5.1 mmol/L 4.4 3.8 4.2  Chloride 98 - 111 mmol/L 111 105 103  CO2 22 - 32  mmol/L - 24 25  Calcium 8.9 - 10.3 mg/dL - 8.6(L) 8.7  Total Protein 6.5 - 8.1 g/dL - 6.8 6.9  Total Bilirubin 0.3 - 1.2 mg/dL - 0.6 0.3  Alkaline Phos 38 - 126 U/L - 61 80  AST 15 - 41 U/L - 13(L) 14  ALT 0 - 44 U/L - 12 10   Lipid Panel     Component Value Date/Time   CHOL 211 (H) 09/21/2020 1614   TRIG 107 09/21/2020 1614   HDL 60 09/21/2020 1614   CHOLHDL 3.5 09/21/2020 1614   CHOLHDL 3.7 12/26/2015 0408   VLDL 31 12/26/2015 0408   LDLCALC 132 (H) 09/21/2020 1614    CBC    Component Value Date/Time   WBC 5.8 09/05/2020 1142   RBC 5.45 (H) 09/05/2020 1142   HGB 16.3 (H) 09/05/2020 1158   HGB 15.5 04/10/2020 1140   HCT 48.0 (H) 09/05/2020 1158   HCT 47.2 (H) 04/10/2020 1140   PLT 199 09/05/2020 1142   PLT 207 08/26/2019 1154   MCV 89.9 09/05/2020 1142   MCV 86 04/10/2020 1140   MCH 28.3 09/05/2020 1142   MCHC 31.4 09/05/2020 1142   RDW 14.6 09/05/2020 1142   RDW 13.3 04/10/2020 1140   LYMPHSABS 2.0 09/05/2020 1142   LYMPHSABS 2.4 04/10/2020 1140   MONOABS 0.4 09/05/2020 1142   EOSABS 0.1 09/05/2020 1142   EOSABS 0.1 04/10/2020 1140   BASOSABS 0.1 09/05/2020 1142   BASOSABS 0.0 04/10/2020 1140    ASSESSMENT AND PLAN: 1. Chronic neck pain Nonradicular but progressively worse per patient.  We will get an MRI and further management will be based on results.  In the meantime, I have  recommended trial of low-dose Cymbalta to help with chronic musculoskeletal pain.  Patient is agreeable to this. - MR Cervical Spine Wo Contrast; Future  2. Essential hypertension Not at goal.  Encourage patient to take the amlodipine consistently.  Stressed the importance of good blood pressure control given her history of stroke - CBC - Comprehensive metabolic panel  3. Hyperlipidemia, unspecified hyperlipidemia type Continue pravastatin. - Lipid panel  4. Urinary incontinence, mixed We will send request for incontinence supplies to [facility] - For home use only DME Other see  comment  5. Postoperative hypothyroidism - TSH  6. Encounter for screening mammogram for malignant neoplasm of breast - MM Digital Screening; Future  7. Influenza vaccination declined   8. Pneumococcal vaccination declined  Patient was given the opportunity to ask questions.  Patient verbalized understanding of the plan and was able to repeat key elements of the plan.   Orders Placed This Encounter  Procedures   For home use only DME Other see comment   MM Digital Screening   MR Cervical Spine Wo Contrast   CBC   Comprehensive metabolic panel   Lipid panel   TSH      Requested Prescriptions   Signed Prescriptions Disp Refills   DULoxetine (CYMBALTA) 20 MG capsule 20 capsule 3    Sig: Take 1 capsule (20 mg total) by mouth daily.    No follow-ups on file.  Karle Plumber, MD, FACP

## 2021-10-01 NOTE — Patient Instructions (Signed)
I encourage you to take the amlodipine every day to control your blood pressure.  The goal is less than 130/80.  Start Cymbalta 20 mg daily for chronic pain in the neck. I have ordered an MRI of your neck.  Further management will be based on results.

## 2021-10-02 ENCOUNTER — Other Ambulatory Visit: Payer: Self-pay

## 2021-10-02 ENCOUNTER — Other Ambulatory Visit: Payer: Self-pay | Admitting: Internal Medicine

## 2021-10-02 DIAGNOSIS — E890 Postprocedural hypothyroidism: Secondary | ICD-10-CM

## 2021-10-02 LAB — COMPREHENSIVE METABOLIC PANEL
ALT: 9 IU/L (ref 0–32)
AST: 11 IU/L (ref 0–40)
Albumin/Globulin Ratio: 1.6 (ref 1.2–2.2)
Albumin: 4.2 g/dL (ref 3.8–4.9)
Alkaline Phosphatase: 77 IU/L (ref 44–121)
BUN/Creatinine Ratio: 13 (ref 9–23)
BUN: 10 mg/dL (ref 6–24)
Bilirubin Total: 0.2 mg/dL (ref 0.0–1.2)
CO2: 26 mmol/L (ref 20–29)
Calcium: 8.8 mg/dL (ref 8.7–10.2)
Chloride: 103 mmol/L (ref 96–106)
Creatinine, Ser: 0.77 mg/dL (ref 0.57–1.00)
Globulin, Total: 2.7 g/dL (ref 1.5–4.5)
Glucose: 80 mg/dL (ref 70–99)
Potassium: 4.3 mmol/L (ref 3.5–5.2)
Sodium: 142 mmol/L (ref 134–144)
Total Protein: 6.9 g/dL (ref 6.0–8.5)
eGFR: 90 mL/min/{1.73_m2} (ref >59)

## 2021-10-02 LAB — LIPID PANEL
Chol/HDL Ratio: 4.8 ratio — ABNORMAL HIGH (ref 0.0–4.4)
Cholesterol, Total: 265 mg/dL — ABNORMAL HIGH (ref 100–199)
HDL: 55 mg/dL (ref >39)
LDL Chol Calc (NIH): 179 mg/dL — ABNORMAL HIGH (ref 0–99)
Triglycerides: 169 mg/dL — ABNORMAL HIGH (ref 0–149)
VLDL Cholesterol Cal: 31 mg/dL (ref 5–40)

## 2021-10-02 LAB — CBC
Hematocrit: 48.6 % — ABNORMAL HIGH (ref 34.0–46.6)
Hemoglobin: 16 g/dL — ABNORMAL HIGH (ref 11.1–15.9)
MCH: 28.2 pg (ref 26.6–33.0)
MCHC: 32.9 g/dL (ref 31.5–35.7)
MCV: 86 fL (ref 79–97)
Platelets: 214 10*3/uL (ref 150–450)
RBC: 5.68 x10E6/uL — ABNORMAL HIGH (ref 3.77–5.28)
RDW: 13.1 % (ref 11.7–15.4)
WBC: 5.7 10*3/uL (ref 3.4–10.8)

## 2021-10-02 LAB — TSH: TSH: 18.9 u[IU]/mL — ABNORMAL HIGH (ref 0.450–4.500)

## 2021-10-02 MED ORDER — LEVOTHYROXINE SODIUM 112 MCG PO TABS
112.0000 ug | ORAL_TABLET | Freq: Every day | ORAL | 4 refills | Status: DC
  Filled 2021-10-02 – 2021-12-25 (×3): qty 30, 30d supply, fill #0
  Filled 2022-02-13: qty 30, 30d supply, fill #1
  Filled 2022-02-20: qty 30, 30d supply, fill #0
  Filled 2022-03-26: qty 30, 30d supply, fill #1
  Filled 2022-05-13: qty 30, 30d supply, fill #2

## 2021-10-02 NOTE — Progress Notes (Signed)
Let patient know that her kidney and liver function tests are normal.  Cholesterol level is 179 with goal being less than 70.  This suggests that she has not been taking the pravastatin consistently.  Please encourage her to do so.  Increase cholesterol increases her risk for having another stroke or heart attack.  Thyroid level is significantly abnormal suggesting that she has not been taking the levothyroxine consistently.  Please encourage her to do so.  She should be on levothyroxine 112 mcg daily.  Refill sent to the pharmacy.  Please return to the lab in about 4 to 6 weeks for repeat thyroid level check.  Blood cell count mildly elevated likely due to cigarette smoking.

## 2021-10-03 ENCOUNTER — Telehealth: Payer: Self-pay

## 2021-10-03 NOTE — Telephone Encounter (Signed)
Contacted pt to go over lab results pt is aware and doesn't have any questions or concerns 

## 2021-10-08 ENCOUNTER — Telehealth: Payer: Self-pay

## 2021-10-08 NOTE — Telephone Encounter (Signed)
Pt MRI has been cancelled due to prior auth. Contacted the scheduling dept and rescheduled MRI  MRI is scheduled for November 28 at 3pm at [facility]. Pt will need to arrive at 245pm.    Contacted [facility] and spoke intake representative    [phone] MR Cervical Spine Wo Contrast  DX-M54.2 Chronic neck pain  There is 2 DX codes but [facility] is only requesting the main DX for procedure  The other DX code in chart is G89.29 degenerative changes on xray  Provided all clinical information   After providing all clinical information more information is required. Procedure is requesting a peer to peer.    Pending order #[id number]   Peer to Peer [phone]  Will forward to provider to do peer to peer

## 2021-10-08 NOTE — Telephone Encounter (Signed)
Phone call placed for peer to peer to get MRI of the cervical spine approved.  It was approved.  Authorization number is [id number].  Authorization is good until 12/06/2021.

## 2021-10-09 ENCOUNTER — Other Ambulatory Visit: Payer: Self-pay

## 2021-10-09 ENCOUNTER — Ambulatory Visit (HOSPITAL_COMMUNITY): Admission: RE | Admit: 2021-10-09 | Payer: Medicaid Other | Source: Ambulatory Visit

## 2021-10-10 DIAGNOSIS — R32 Unspecified urinary incontinence: Secondary | ICD-10-CM | POA: Diagnosis not present

## 2021-10-15 ENCOUNTER — Ambulatory Visit (HOSPITAL_COMMUNITY): Admission: RE | Admit: 2021-10-15 | Payer: Medicaid Other | Source: Ambulatory Visit

## 2021-11-01 ENCOUNTER — Other Ambulatory Visit: Payer: Self-pay

## 2021-11-02 ENCOUNTER — Other Ambulatory Visit: Payer: Self-pay

## 2021-11-04 IMAGING — MR MR HEAD W/O CM
12 of 17 series · 35 of 48 positions shown · non-contrast
Comparison: Prior CT from earlier the same day.

CLINICAL DATA: Initial evaluation for acute stroke.



[Series 5: DWI · axial · 3.0mm · 0.88mm/px · z∈[-127,+26]mm · 6 of 108 slices shown (1 of 4)]
[im 1/108]
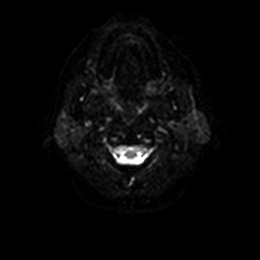
[im 22/108]
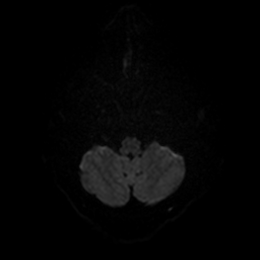
[im 43/108]
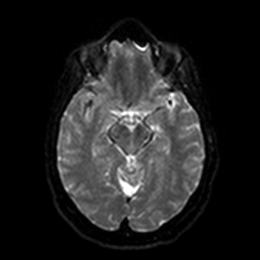
[im 65/108]
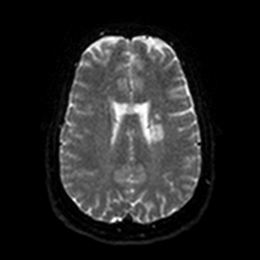
[im 86/108]
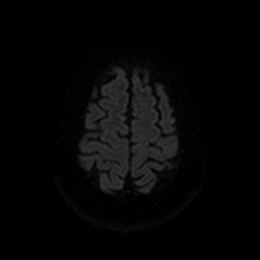
[im 108/108]
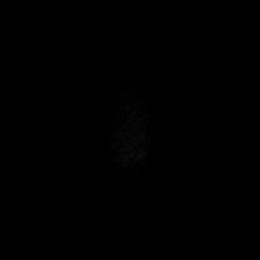

[Series 6: DWI · axial · 3.0mm · 0.88mm/px · z∈[-127,+26]mm · 2 of 53 slices shown (2 of 4)]
[im 1/53]
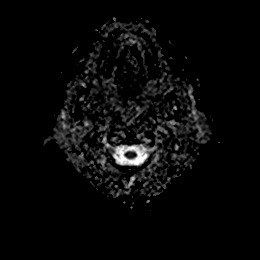
[im 53/53]
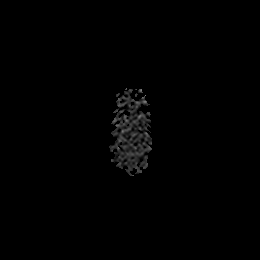

[Series 7: DWI · coronal · 4.0mm · 0.88mm/px · 5 of 76 slices shown (3 of 4)]
[im 1/76]
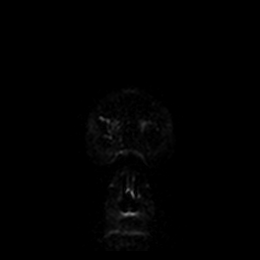
[im 19/76]
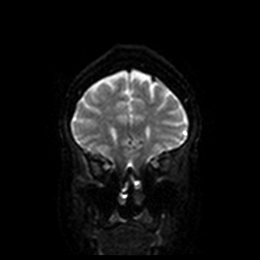
[im 38/76]
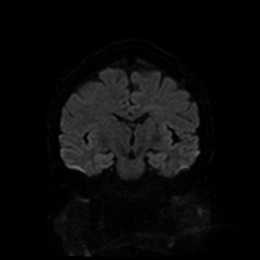
[im 57/76]
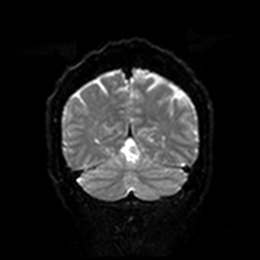
[im 76/76]
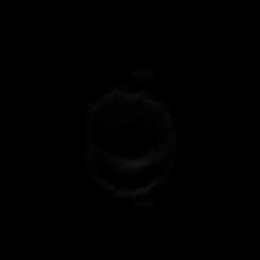

[Series 8: DWI · coronal · 4.0mm · 0.88mm/px · 2 of 38 slices shown (4 of 4)]
[im 1/38]
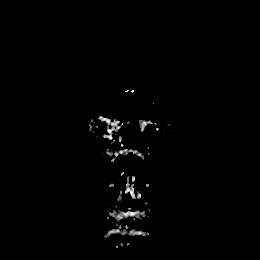
[im 38/38]
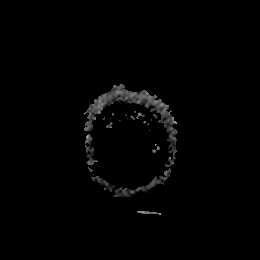

[Series 9: FLAIR · axial · 5.0mm · 0.45mm/px · z∈[-126,+24]mm · 2 of 27 slices shown]
[im 1/27]
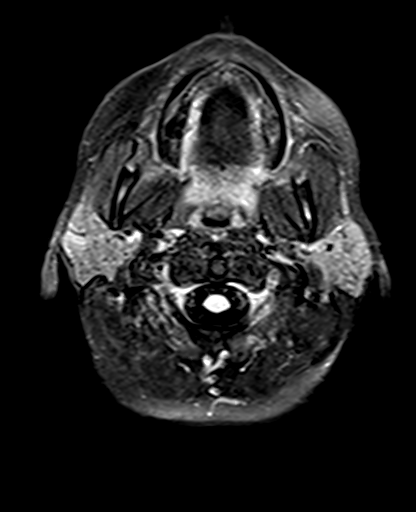
[im 27/27]
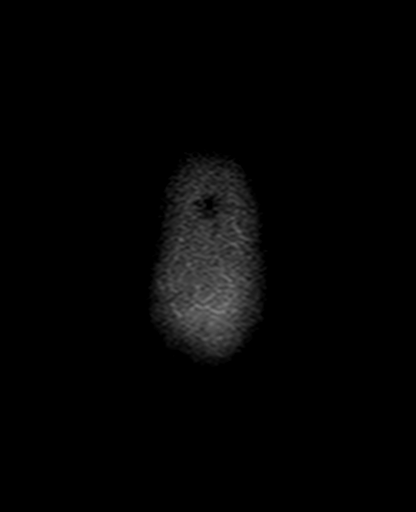

[Series 10: T1 · sagittal · 5.0mm · 0.75mm/px · 2 of 25 slices shown]
[im 1/25]
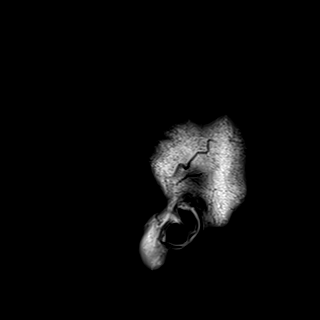
[im 25/25]
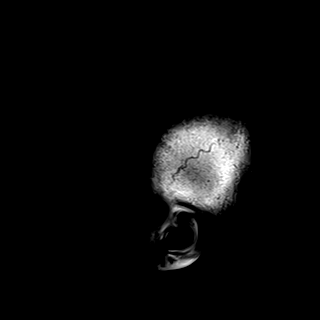

[Series 11: T2 · axial · 5.0mm · 0.72mm/px · z∈[-124,+25]mm · 2 of 27 slices shown (1 of 2)]
[im 1/27]
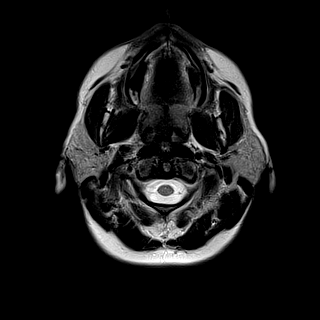
[im 27/27]
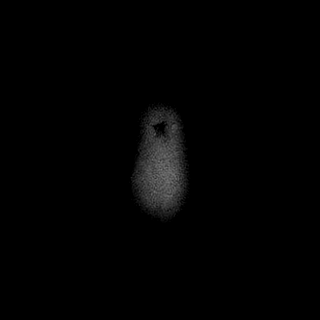

[Series 12: mag_images · axial · 3.0mm · 0.90mm/px · z∈[-128,+18]mm · 3 of 52 slices shown]
[im 1/52]
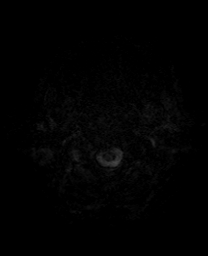
[im 26/52]
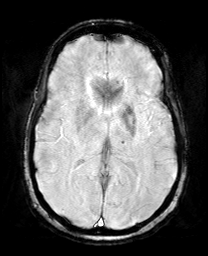
[im 52/52]
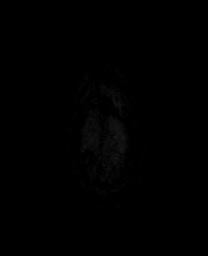

[Series 13: pha_images · axial · 3.0mm · 0.90mm/px · z∈[-125,+18]mm · 3 of 51 slices shown]
[im 1/51]
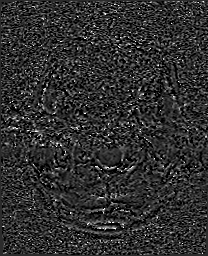
[im 26/51]
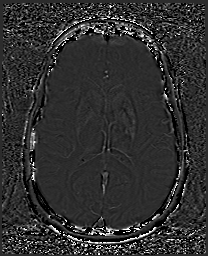
[im 51/51]
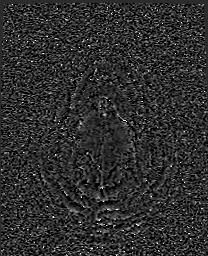

[Series 14: swi_images · axial · 3.0mm · 0.90mm/px · z∈[-128,+18]mm · 3 of 52 slices shown]
[im 1/52]
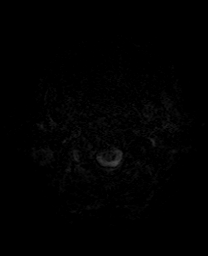
[im 26/52]
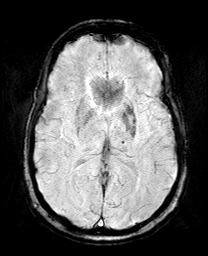
[im 52/52]
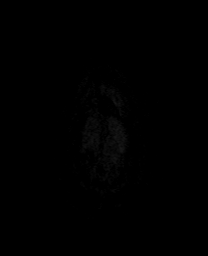

[Series 15: mip_images(sw) · axial · 24.0mm · 0.90mm/px · z∈[-118,+8]mm · 3 of 45 slices shown]
[im 1/45]
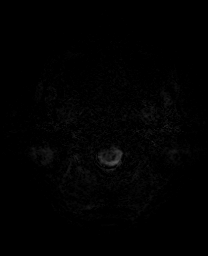
[im 23/45]
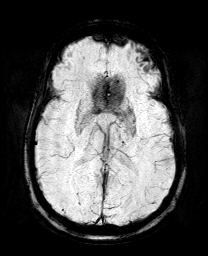
[im 45/45]
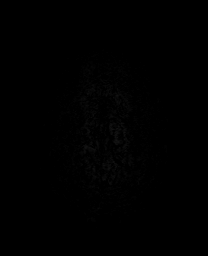

[Series 17: T2 · coronal · 5.0mm · 0.34mm/px · 2 of 31 slices shown (2 of 2)]
[im 1/31]
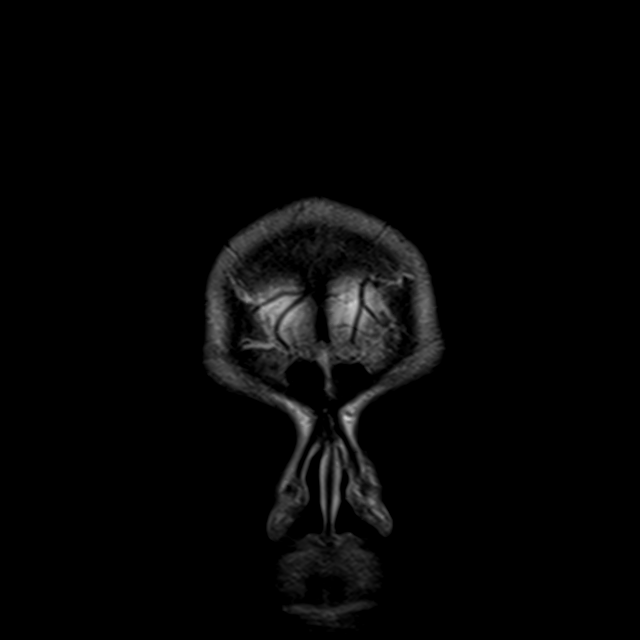
[im 31/31]
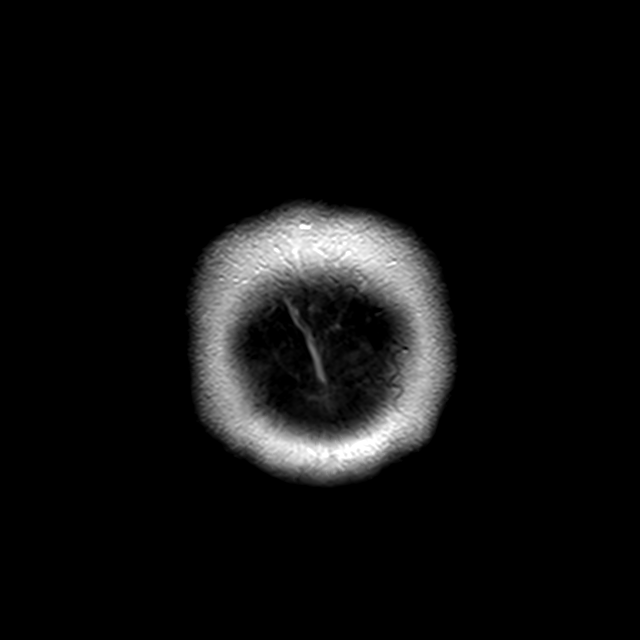

[35 of 48 positions shown; findings below may reference images not displayed]

FINDINGS: MRI HEAD FINDINGS

Brain: Generalized age-related cerebral atrophy. Remote lacunar
infarcts noted involving the left basal ganglia. Additional patchy
T2/FLAIR signal abnormality with encephalomalacia involving the body
of the corpus callosum also consistent with a remote ischemic
infarct.

No abnormal foci of restricted diffusion to suggest acute or
subacute ischemia. Gray-white matter differentiation otherwise
maintained. No acute intracranial hemorrhage. Single punctate
chronic microhemorrhage noted within the left thalamus.

No mass lesion, midline shift or mass effect. No hydrocephalus or
extra-axial fluid collection. Pituitary gland suprasellar region
normal. Midline structures intact.

Vascular: Major intracranial vascular flow voids are maintained.

Skull and upper cervical spine: Craniocervical junction within
normal limits. Visualized upper cervical spine normal. Bone marrow
signal intensity within normal limits. No scalp soft tissue
abnormality.

Sinuses/Orbits: Globes and orbital soft tissues within normal
limits. Mild mucosal thickening noted within the ethmoidal air
cells. Paranasal sinuses are otherwise clear. No mastoid effusion.
Inner ear structures grossly normal.

Other: None.

MRA HEAD FINDINGS

ANTERIOR CIRCULATION:

Examination mildly degraded by motion artifact.

Visualized distal cervical segments of the internal carotid arteries
are widely patent with antegrade flow. Petrous, cavernous, and
supraclinoid segments widely patent without stenosis or other
abnormality. A1 segments patent bilaterally. Normal anterior
communicating artery complex. Right ACA widely patent to its distal
aspect without stenosis. Short-segment severe stenosis noted at the
left A2/A3 junction (series 1, image 138). Left ACA patent distally.

Severe stenosis measuring approximately 6 mm in length seen
involving the proximal-mid left M1 segment. Distal left M1 segment
otherwise widely patent. Normal left MCA bifurcation. Distal left
MCA branches well perfused, although demonstrates small vessel
atheromatous irregularity.

Right M1 segment widely patent. Normal right MCA bifurcation. Distal
right MCA branches well perfused, although demonstrates small vessel
atheromatous irregularity.

POSTERIOR CIRCULATION:

Both vertebral arteries patent as they course into the cranial
vault. Right vertebral artery slightly dominant. Apparent
short-segment defects/stenoses involving the mid V4 segments most
consistent with motion artifact. Vertebral arteries otherwise widely
patent. Both picas patent. Basilar demonstrates multifocal
atheromatous irregularity but is patent to its distal aspect without
flow-limiting stenosis. Superior cerebral arteries patent
bilaterally. Both PCAs primarily supplied via the basilar. PCAs
widely patent proximally, although demonstrates distal small vessel
atheromatous irregularity.

No aneurysm.

MRA NECK FINDINGS

AORTIC ARCH: Examination technically limited by lack of IV contrast
and motion artifact.

Visualized aortic arch normal in caliber with normal branch pattern.
No appreciable flow-limiting stenosis about the origin of the great
vessels.

RIGHT CAROTID SYSTEM: Visualized right CCA patent to the bifurcation
without stenosis. No significant atheromatous narrowing or stenosis
about the right bifurcation. Right ICA patent distally to the skull
base without stenosis or evidence for dissection.

LEFT CAROTID SYSTEM: Visualized left CCA patent from its origin to
the bifurcation without stenosis. No significant atheromatous
stenosis seen about the left bifurcation. Left ICA patent distally
to the skull base without stenosis or evidence for dissection.

VERTEBRAL ARTERIES: Origins of the vertebral arteries not well
assessed on this exam. Visualized vertebral arteries widely patent
within the neck with antegrade flow. No appreciable vertebral artery
stenosis or evidence for dissection. Right vertebral artery slightly
dominant.
IMPRESSION: MRI HEAD IMPRESSION:

1. No acute intracranial abnormality.
2. Remote lacunar infarcts involving the left basal ganglia and
corpus callosum.

MRA HEAD IMPRESSION:

1. Negative intracranial MRA for large vessel occlusion.
2. Severe atheromatous stenosis involving the proximal-mid left M1
segment, with additional severe short-segment stenosis at the left
A2/A3 junction.
3. Diffuse small vessel atheromatous irregularity throughout the
intracranial circulation.

MRA NECK IMPRESSION:

Negative MRA of the neck. No hemodynamically significant or critical
flow limiting stenosis identified.

## 2021-11-10 DIAGNOSIS — R32 Unspecified urinary incontinence: Secondary | ICD-10-CM | POA: Diagnosis not present

## 2021-12-07 ENCOUNTER — Other Ambulatory Visit: Payer: Self-pay

## 2021-12-20 ENCOUNTER — Telehealth: Payer: Self-pay

## 2021-12-20 NOTE — Telephone Encounter (Signed)
Please Jay'A Pollock RMA, sign / close this encounter so I can sign / close it from my basket.  Thank you.

## 2021-12-20 NOTE — Telephone Encounter (Signed)
Amy are you able to close this telephone encounter

## 2021-12-20 NOTE — Telephone Encounter (Signed)
Susette Racer,   This may be best addressed by her primary provider Karle Plumber, MD.

## 2021-12-20 NOTE — Telephone Encounter (Signed)
error 

## 2021-12-24 NOTE — Telephone Encounter (Signed)
I have two notes on this patient being 12/16/2019 and 04/10/2020. Both notes are time stamped and signed.

## 2021-12-24 NOTE — Telephone Encounter (Signed)
This pt is not requesting any medications... [name] is trying to get this out her basket. Are you able to close note from a year ago?

## 2021-12-25 ENCOUNTER — Other Ambulatory Visit: Payer: Self-pay

## 2021-12-26 ENCOUNTER — Other Ambulatory Visit: Payer: Self-pay

## 2021-12-27 ENCOUNTER — Other Ambulatory Visit: Payer: Self-pay

## 2022-02-13 ENCOUNTER — Other Ambulatory Visit: Payer: Self-pay

## 2022-02-20 ENCOUNTER — Other Ambulatory Visit: Payer: Self-pay

## 2022-02-20 ENCOUNTER — Other Ambulatory Visit (HOSPITAL_COMMUNITY): Payer: Self-pay

## 2022-02-21 ENCOUNTER — Other Ambulatory Visit (HOSPITAL_COMMUNITY): Payer: Self-pay

## 2022-03-26 ENCOUNTER — Other Ambulatory Visit (HOSPITAL_COMMUNITY): Payer: Self-pay

## 2022-04-01 ENCOUNTER — Other Ambulatory Visit (HOSPITAL_COMMUNITY): Payer: Self-pay

## 2022-04-05 ENCOUNTER — Other Ambulatory Visit (HOSPITAL_COMMUNITY): Payer: Self-pay

## 2022-04-22 ENCOUNTER — Ambulatory Visit: Payer: Self-pay

## 2022-04-22 NOTE — Telephone Encounter (Signed)
  Chief Complaint: Difficulty breathing Symptoms: Difficulty breathing, can't turn her head, pressure at the base of skull Frequency: 4 weeks Pertinent Negatives: Patient denies Chest pain Disposition: '[x]'$ ED /'[]'$ Urgent Care (no appt availability in office) / '[]'$ Appointment(In office/virtual)/ '[]'$  [facility]/ '[]'$ Home Care/ '[]'$ Refused Recommended Disposition /'[]'$ [facility]/ '[]'$  Follow-up with PCP Additional Notes: PT states that she is having difficulty breathing starting about 4 weeks ago and has gotten progressively worse. She states that she feels like she did when she had thyroid cancer, and thinks it may have returned.  She also states that she can't move her head from side to side, the base of her skull has pressure, and her tongue feels big. Pt will go to the ED, but may not go until tomorrow when her mom can take her. Reason for Disposition  [1] MODERATE difficulty breathing (e.g., speaks in phrases, SOB even at rest, pulse 100-120) AND [2] NEW-onset or WORSE than normal  Answer Assessment - Initial Assessment Questions 1. RESPIRATORY STATUS: "Describe your breathing?" (e.g., wheezing, shortness of breath, unable to speak, severe coughing)      Shortness of breath 2. ONSET: "When did this breathing problem begin?"      1 month noticed it - tongue feels big 3. PATTERN "Does the difficult breathing come and go, or has it been constant since it started?"      constant 4. SEVERITY: "How bad is your breathing?" (e.g., mild, moderate, severe)    - MILD: No SOB at rest, mild SOB with walking, speaks normally in sentences, can lie down, no retractions, pulse < 100.    - MODERATE: SOB at rest, SOB with minimal exertion and prefers to sit, cannot lie down flat, speaks in phrases, mild retractions, audible wheezing, pulse 100-120.    - SEVERE: Very SOB at rest, speaks in single words, struggling to breathe, sitting hunched forward, retractions, pulse > 120      mild 5. RECURRENT  SYMPTOM: "Have you had difficulty breathing before?" If Yes, ask: "When was the last time?" and "What happened that time?"      Thyroid cancer 6. CARDIAC HISTORY: "Do you have any history of heart disease?" (e.g., heart attack, angina, bypass surgery, angioplasty)      no 7. LUNG HISTORY: "Do you have any history of lung disease?"  (e.g., pulmonary embolus, asthma, emphysema)     no 8. CAUSE: "What do you think is causing the breathing problem?"      Thyroid cancer  9. OTHER SYMPTOMS: "Do you have any other symptoms? (e.g., dizziness, runny nose, cough, chest pain, fever)     no 10. O2 SATURATION MONITOR:  "Do you use an oxygen saturation monitor (pulse oximeter) at home?" If Yes, "What is your reading (oxygen level) today?" "What is your usual oxygen saturation reading?" (e.g., 95%)       no 11. PREGNANCY: "Is there any chance you are pregnant?" "When was your last menstrual period?"       na 12. TRAVEL: "Have you traveled out of the country in the last month?" (e.g., travel history, exposures)       na  Protocols used: Breathing Difficulty-A-AH

## 2022-05-13 ENCOUNTER — Other Ambulatory Visit (HOSPITAL_COMMUNITY): Payer: Self-pay

## 2022-07-14 ENCOUNTER — Other Ambulatory Visit: Payer: Self-pay | Admitting: Internal Medicine

## 2022-07-14 DIAGNOSIS — E890 Postprocedural hypothyroidism: Secondary | ICD-10-CM

## 2022-07-15 ENCOUNTER — Encounter: Payer: Self-pay | Admitting: *Deleted

## 2022-07-15 ENCOUNTER — Telehealth: Payer: Self-pay | Admitting: *Deleted

## 2022-07-15 ENCOUNTER — Other Ambulatory Visit (HOSPITAL_COMMUNITY): Payer: Self-pay

## 2022-07-15 ENCOUNTER — Other Ambulatory Visit: Payer: Self-pay | Admitting: Internal Medicine

## 2022-07-15 DIAGNOSIS — Z8673 Personal history of transient ischemic attack (TIA), and cerebral infarction without residual deficits: Secondary | ICD-10-CM

## 2022-07-15 DIAGNOSIS — E785 Hyperlipidemia, unspecified: Secondary | ICD-10-CM

## 2022-07-15 MED ORDER — LEVOTHYROXINE SODIUM 112 MCG PO TABS
112.0000 ug | ORAL_TABLET | Freq: Every day | ORAL | 2 refills | Status: DC
  Filled 2022-07-15 – 2022-07-24 (×2): qty 30, 30d supply, fill #0
  Filled 2022-09-01: qty 30, 30d supply, fill #1

## 2022-07-15 NOTE — Telephone Encounter (Signed)
Copied from [place] [id number]. Topic: General - Other >> Jul 15, 2022 12:04 PM Everette C wrote: Reason for CRM: Medication Refill - Medication: Rx #: [id number]  levothyroxine (SYNTHROID) 112 MCG tablet [[id number]] - [place] brand (needs to be specifically noted and prescribed)   Has the patient contacted their pharmacy? Yes.  The patient's pharmacy has made contact on their behalf  (Agent: If no, request that the patient contact the pharmacy for the refill. If patient does not wish to contact the pharmacy document the reason why and proceed with request.) (Agent: If yes, when and what did the pharmacy advise?)  Preferred Pharmacy (with phone number or street name): [place] [address] Phone: [phone] Fax: [phone] Hours: Mon-Fri 7:30am-6pm; Sat 8:00am-4:30pm  Has the patient been seen for an appointment in the last year OR does the patient have an upcoming appointment? Yes.    Agent: Please be advised that RX refills may take up to 3 business days. We ask that you follow-up with your pharmacy.

## 2022-07-15 NOTE — Telephone Encounter (Signed)
Attempt to call patient several time at all contacts listed to inform that an OV was needed for additional labs. Do not see where she returned to office for f/u TSH in 6 weeks per Dr. Durenda Age recommendations 10/01/2021.   Let patient know that her kidney and liver function tests are normal.  Cholesterol level is 179 with goal being less than 70.  This suggests that she has not been taking the pravastatin consistently.  Please encourage her to do so.  Increase cholesterol increases her risk for having another stroke or heart attack.  Thyroid level is significantly abnormal suggesting that she has not been taking the levothyroxine consistently.  Please encourage her to do so.  She should be on levothyroxine 112 mcg daily.  Refill sent to the pharmacy.  Please return to the lab in about 4 to 6 weeks for repeat thyroid level check.  Blood cell count mildly elevated likely due to cigarette smoking.    Patient also needing a f/u for HTN. Last OV was 09/2021.

## 2022-07-15 NOTE — Telephone Encounter (Signed)
Rx has expired, has been sent for approval.

## 2022-07-15 NOTE — Telephone Encounter (Signed)
Pt returned call, information from results and provider comments given, appt scheduled. She had refills on meds but wanted Pravastatin moved to [facility].

## 2022-07-15 NOTE — Addendum Note (Signed)
Addended by: Hoyle Barr on: 07/15/2022 05:36 PM   Modules accepted: Orders

## 2022-07-15 NOTE — Telephone Encounter (Signed)
Pt returned call and scheduled appt, wants Pravacol rx moved to [place].

## 2022-07-15 NOTE — Telephone Encounter (Signed)
This encounter was created in error - please disregard.

## 2022-07-15 NOTE — Telephone Encounter (Signed)
Appt made for Nov. Requested Prescriptions  Pending Prescriptions Disp Refills  . levothyroxine (SYNTHROID) 112 MCG tablet 30 tablet 4    Sig: Take 1 tablet (112 mcg total) by mouth daily before breakfast.     Endocrinology:  Hypothyroid Agents Failed - 07/14/2022 11:20 PM      Failed - TSH in normal range and within 360 days    TSH  Date Value Ref Range Status  10/01/2021 18.900 (H) 0.450 - 4.500 uIU/mL Final         Passed - Valid encounter within last 12 months    Recent Outpatient Visits          9 months ago Chronic neck pain   [place], MD   10 months ago Neck pain   [facility] at [facility], Kriste Basque, NP   1 year ago Lesion of finger   [place], MD   1 year ago Screen for STD (sexually transmitted disease)   [facility], Dionne Bucy, [place]   1 year ago Annual physical exam   [place], MD      Future Appointments            In 2 months Wynetta Emery Dalbert Batman, MD [place]

## 2022-07-16 ENCOUNTER — Other Ambulatory Visit (HOSPITAL_COMMUNITY): Payer: Self-pay

## 2022-07-16 MED ORDER — PRAVASTATIN SODIUM 20 MG PO TABS
ORAL_TABLET | Freq: Every day | ORAL | 3 refills | Status: DC
  Filled 2022-07-16: qty 90, 90d supply, fill #0

## 2022-07-16 NOTE — Telephone Encounter (Signed)
Rx sent to [facility].

## 2022-07-16 NOTE — Addendum Note (Signed)
Addended by: Carilyn Goodpasture on: 07/16/2022 11:14 AM   Modules accepted: Orders

## 2022-07-18 ENCOUNTER — Other Ambulatory Visit: Payer: Self-pay | Admitting: Internal Medicine

## 2022-07-18 DIAGNOSIS — E890 Postprocedural hypothyroidism: Secondary | ICD-10-CM

## 2022-07-18 NOTE — Telephone Encounter (Signed)
Medication Refill - Medication: levothyroxine (SYNTHROID) 112 MCG tablet  Has the patient contacted their pharmacy? Yes.     Preferred Pharmacy (with phone number or street name):  [facility] Phone:  [phone]  Fax:  [phone]      Has the patient been seen for an appointment in the last year OR does the patient have an upcoming appointment? Yes.    Patient states she took her last pill yesterday of the 3 the pharmacy gave her to last until she got another refill. Please assistt patient further

## 2022-07-19 ENCOUNTER — Other Ambulatory Visit (HOSPITAL_COMMUNITY): Payer: Self-pay

## 2022-07-19 NOTE — Telephone Encounter (Signed)
Call to pharmacy- verified Rx received- duplicate request Requested Prescriptions  Pending Prescriptions Disp Refills  . levothyroxine (SYNTHROID) 112 MCG tablet 30 tablet 2    Sig: Take 1 tablet (112 mcg total) by mouth daily before breakfast.     Endocrinology:  Hypothyroid Agents Failed - 07/18/2022  3:53 PM      Failed - TSH in normal range and within 360 days    TSH  Date Value Ref Range Status  10/01/2021 18.900 (H) 0.450 - 4.500 uIU/mL Final         Passed - Valid encounter within last 12 months    Recent Outpatient Visits          9 months ago Chronic neck pain   [place], MD   10 months ago Neck pain   [facility] at [facility], Kriste Basque, NP   1 year ago Lesion of finger   [place], MD   1 year ago Screen for STD (sexually transmitted disease)   [place], Dionne Bucy, [place]   1 year ago Annual physical exam   [place], MD      Future Appointments            In 2 months Wynetta Emery Dalbert Batman, MD [place]

## 2022-07-24 ENCOUNTER — Other Ambulatory Visit (HOSPITAL_COMMUNITY): Payer: Self-pay

## 2022-08-26 DIAGNOSIS — R32 Unspecified urinary incontinence: Secondary | ICD-10-CM | POA: Diagnosis not present

## 2022-09-02 ENCOUNTER — Other Ambulatory Visit (HOSPITAL_COMMUNITY): Payer: Self-pay

## 2022-09-04 ENCOUNTER — Other Ambulatory Visit (HOSPITAL_COMMUNITY): Payer: Self-pay

## 2022-09-19 ENCOUNTER — Other Ambulatory Visit (HOSPITAL_COMMUNITY)
Admission: RE | Admit: 2022-09-19 | Discharge: 2022-09-19 | Disposition: A | Discharge status: Home / Self Care | Payer: Medicaid Other | Source: Ambulatory Visit | Attending: Internal Medicine | Admitting: Internal Medicine

## 2022-09-19 ENCOUNTER — Encounter: Payer: Self-pay | Admitting: Internal Medicine

## 2022-09-19 ENCOUNTER — Ambulatory Visit: Payer: Medicaid Other | Attending: Internal Medicine | Admitting: Internal Medicine

## 2022-09-19 VITALS — BP 108/69 | HR 80 | Temp 97.8°F | Ht 65.5 in | Wt 160.4 lb

## 2022-09-19 DIAGNOSIS — N898 Other specified noninflammatory disorders of vagina: Secondary | ICD-10-CM

## 2022-09-19 DIAGNOSIS — E890 Postprocedural hypothyroidism: Secondary | ICD-10-CM | POA: Diagnosis not present

## 2022-09-19 DIAGNOSIS — Z1231 Encounter for screening mammogram for malignant neoplasm of breast: Secondary | ICD-10-CM

## 2022-09-19 DIAGNOSIS — E782 Mixed hyperlipidemia: Secondary | ICD-10-CM

## 2022-09-19 DIAGNOSIS — R400 Somnolence: Secondary | ICD-10-CM | POA: Diagnosis not present

## 2022-09-19 DIAGNOSIS — F17200 Nicotine dependence, unspecified, uncomplicated: Secondary | ICD-10-CM

## 2022-09-19 DIAGNOSIS — M2041 Other hammer toe(s) (acquired), right foot: Secondary | ICD-10-CM

## 2022-09-19 DIAGNOSIS — Z2821 Immunization not carried out because of patient refusal: Secondary | ICD-10-CM

## 2022-09-19 NOTE — Progress Notes (Signed)
Patient ID: Theresa Watkins, female    DOB: 12-26-1965  MRN: [id number]  CC: Vaginal Odor and Medication Refill   Subjective: Chole Driver is a 56 y.o. female who presents for chronic ds Her concerns today include:  Patient with history of HTN, HL, CVA (LT basal ganglia hemorrhagic infarct 12/2015), hypothyroidism (hx of thyroid CA 2011), GERD, anxious mood, tob dep, episodic cocaine   C/o vaginal odor.  Wants to get check No vaginal discgh No new partners but condom broke a few wks ago  HTN: not taking Norvasc consistently.  Only if she feels BP high Not taking Pravastatin consistently either.  Pt has hx of CVA  Tob dep: 1 pk/wk.  She has cut back but not ready to quit  Hypothyroid:  taking Levothyroxine consistently.  Not sure if 112 or 125 mcg. Should be 112 mcg daily  2nd toe on RT foot is deformed and causes irritation.  Would like referral to podiatrist to pursue surgical option  Feels tired and sleeps a lot.  Usually gets in bed around 12 midnight then wakes up at 6 AM to 7 AM to eat breakfast.  She then gets back in bed and sleeps until about 12 noon.  She does not think she snores and was never told that she does.  She does wake up feeling refreshed in the mornings but then she just likes to get back in bed and sleep more.  She gets incontinence supplies through [employer] and would like to continue to receive her supplies.  She has had urinary incontinence for years.  Unable to hold her urine when she gets the urge to urinate.  Also endorses leakage with coughing and laughing.   HM: declines vaccines for flu and shingles.  Will get COVID booster at outside pharmacy. Patient Active Problem List   Diagnosis Date Noted   Chondromalacia patellae, 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   Unilateral primary osteoarthritis, left hip 09/25/2021   Left foot pain 08/26/2019   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   Tobacco dependence 04/09/2019   Vitamin D deficiency 02/18/2018    Toe deformity, acquired, right 02/18/2018   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   Hyperlipemia 03/21/2017   Headache 03/21/2017   Tooth pain    CVA (cerebral infarction) 12/27/2015   HLD (hyperlipidemia)    Hemiparesis affecting dominant side as late effect of stroke ([place])    Tobacco abuse    Cocaine abuse ([place])    ETOH abuse    Postoperative hypothyroidism 12/24/2015   Drug abuse, cocaine type ([place]) 05/09/2014   Hx of thyroid cancer 01/25/2014   Essential hypertension 06/03/2012     Current Outpatient Medications on File Prior to Visit  Medication Sig Dispense Refill   aspirin 325 MG tablet Take 325 mg by mouth daily.     levothyroxine (SYNTHROID) 125 MCG tablet Take 1 tablet by mouth daily.     vitamin C (ASCORBIC ACID) 500 MG tablet Take 500 mg by mouth daily.     amLODipine (NORVASC) 5 MG tablet Take 1 tablet (5 mg total) by mouth daily. (Patient not taking: Reported on 10/01/2021) 30 tablet 5   DULoxetine (CYMBALTA) 20 MG capsule Take 1 capsule (20 mg total) by mouth daily. (Patient not taking: Reported on 09/19/2022) 20 capsule 3   gabapentin (NEURONTIN) 300 MG capsule Take 1 capsule (300 mg total) by mouth at bedtime. (Patient not taking: Reported on 10/01/2021) 30 capsule 3   levothyroxine (SYNTHROID) 112 MCG tablet  Take 1 tablet (112 mcg total) by mouth daily before breakfast. (Patient not taking: Reported on 09/19/2022) 30 tablet 2   pravastatin (PRAVACHOL) 20 MG tablet TAKE 1 TABLET (20 MG TOTAL) BY MOUTH DAILY. (Patient not taking: Reported on 09/19/2022) 90 tablet 3   [DISCONTINUED] atorvastatin (LIPITOR) 20 MG tablet Take 1 tablet (20 mg total) by mouth daily. 30 tablet 6   No current facility-administered medications on file prior to visit.    Allergies  Allergen Reactions   Gadolinium Derivatives Hives, Itching and Swelling    After MultiHance (gadolinium) injection, pt began sneezing.  After exam, pt told MR tech Burna Mortimer that she was itching.  Dr.  Owens Shark evaluated pt, and noticed hives on pt's back.  Pt said that she felt swelling in her throat.  Dr. Owens Shark directed that pt be taken to hospital via ambulance.     Iohexol Hives, Itching and Other (See Comments)     Code: HIVES, Desc: pts tongue began itching post injection and throat burning, Onset Date: [id number]    Proanthocyanidin Swelling    Swelling of the tongue   Grapeseed Extract [Nutritional Supplements]     Tongue swelll   Diphenhydramine Hcl Rash   Tramadol Rash    Social History   Socioeconomic History   Marital status: Divorced    Spouse name: Not on file   Number of children: 1   Years of education: Not on file   Highest education level: Not on file  Occupational History   Occupation: disabled  Tobacco Use   Smoking status: Light Smoker    Packs/day: 0.10    Years: 25.00    Total pack years: 2.50    Types: Cigarettes   Smokeless tobacco: Never   Tobacco comments:    smoke 2 a day trying to quit  Vaping Use   Vaping Use: Never used  Substance and Sexual Activity   Alcohol use: Yes    Alcohol/week: 0.0 standard drinks of alcohol    Comment: occ   Drug use: Yes    Types: Cocaine    Comment: 1-2x monthly   Sexual activity: Not Currently    Birth control/protection: Surgical  Other Topics Concern   Not on file  Social History Narrative   Not on file   Social Determinants of Health   Financial Resource Strain: Not on file  Food Insecurity: Not on file  Transportation Needs: Not on file  Physical Activity: Not on file  Stress: Not on file  Social Connections: Not on file  Intimate Partner Violence: Not on file    Family History  Problem Relation Age of Onset   Bell's palsy Mother    Hypertension Mother    Diabetes Mother    Stroke Father    Prostate cancer Father    Breast cancer Cousin    Hypertension Sister    Colon cancer Maternal Aunt    Colon cancer Paternal Aunt    Diverticulitis Sister    Diabetes Sister     Past Surgical  History:  Procedure Laterality Date   ABDOMINAL HYSTERECTOMY     CESAREAN SECTION     goiter     removed   laparoscopic  1988   removal  of ectopic preg, ruptured tube   THERAPEUTIC ABORTION     THYROIDECTOMY  march 2011   cancer    ROS: Review of Systems Negative except as stated above  PHYSICAL EXAM: BP 108/69   Pulse 80   Temp 97.8 F (36.6  C) (Temporal)   Ht 5' 5.5" (1.664 m)   Wt 160 lb 6.4 oz (72.8 kg)   SpO2 98%   BMI 26.29 kg/m   Physical Exam  General appearance - alert, well appearing, and in no distress Mental status - yawns repeatedly Neck - supple, no significant adenopathy Chest - clear to auscultation, no wheezes, rales or rhonchi, symmetric air entry Heart - normal rate, regular rhythm, normal S1, S2, no murmurs, rubs, clicks or gallops Musculoskeletal -right foot: Second toe crosses over the first toe and has a small callus on the dorsal surface. Extremities - peripheral pulses normal, no pedal edema, no clubbing or cyanosis      Latest Ref Rng & Units 10/01/2021    2:52 PM 09/05/2020   11:58 AM 09/05/2020   11:42 AM  CMP  Glucose 70 - 99 mg/dL 80  88  89   BUN 6 - 24 mg/dL '10  16  12   '$ Creatinine 0.57 - 1.00 mg/dL 0.77  0.80  0.91   Sodium 134 - 144 mmol/L 142  136  139   Potassium 3.5 - 5.2 mmol/L 4.3  4.4  3.8   Chloride 96 - 106 mmol/L 103  111  105   CO2 20 - 29 mmol/L 26   24   Calcium 8.7 - 10.2 mg/dL 8.8   8.6   Total Protein 6.0 - 8.5 g/dL 6.9   6.8   Total Bilirubin 0.0 - 1.2 mg/dL 0.2   0.6   Alkaline Phos 44 - 121 IU/L 77   61   AST 0 - 40 IU/L 11   13   ALT 0 - 32 IU/L 9   12    Lipid Panel     Component Value Date/Time   CHOL 265 (H) 10/01/2021 1452   TRIG 169 (H) 10/01/2021 1452   HDL 55 10/01/2021 1452   CHOLHDL 4.8 (H) 10/01/2021 1452   CHOLHDL 3.7 12/26/2015 0408   VLDL 31 12/26/2015 0408   LDLCALC 179 (H) 10/01/2021 1452    CBC    Component Value Date/Time   WBC 5.7 10/01/2021 1452   WBC 5.8 09/05/2020 1142    RBC 5.68 (H) 10/01/2021 1452   RBC 5.45 (H) 09/05/2020 1142   HGB 16.0 (H) 10/01/2021 1452   HCT 48.6 (H) 10/01/2021 1452   PLT 214 10/01/2021 1452   MCV 86 10/01/2021 1452   MCH 28.2 10/01/2021 1452   MCH 28.3 09/05/2020 1142   MCHC 32.9 10/01/2021 1452   MCHC 31.4 09/05/2020 1142   RDW 13.1 10/01/2021 1452   LYMPHSABS 2.0 09/05/2020 1142   LYMPHSABS 2.4 04/10/2020 1140   MONOABS 0.4 09/05/2020 1142   EOSABS 0.1 09/05/2020 1142   EOSABS 0.1 04/10/2020 1140   BASOSABS 0.1 09/05/2020 1142   BASOSABS 0.0 04/10/2020 1140    ASSESSMENT AND PLAN: 1. Vaginal odor - Cervicovaginal ancillary only  2. Mixed hyperlipidemia Given her history of CVA, strongly advised her to quit smoking and encouraged her to take statin therapy consistently. - Comprehensive metabolic panel; Future - Lipid panel; Future  3. Tobacco dependence Strongly advised to quit smoking.  Patient not ready to give a trial of quitting.  4. Daytime sleepiness Encourage patient that when she gets up in the mornings, to try to avoid getting back in bed.  She did find things to do during the day to occupy her time.  We also discussed putting her in for sleep study but patient wants to hold off  for now. - CBC; Future  5. Postoperative hypothyroidism Continue Levothyroxine - TSH; Future  6. Hammer toe of right foot [place] submitted to podiatry  7. Influenza vaccination declined Commended.  Patient declined.  8. Encounter for screening mammogram for malignant neoplasm of breast - MM Digital Screening; Future     Patient was given the opportunity to ask questions.  Patient verbalized understanding of the plan and was able to repeat key elements of the plan.   This documentation was completed using Radio producer.  Any transcriptional errors are unintentional.  No orders of the defined types were placed in this encounter.    Requested Prescriptions    No prescriptions requested or  ordered in this encounter    No follow-ups on file.  Karle Plumber, MD, FACP

## 2022-09-23 ENCOUNTER — Other Ambulatory Visit: Payer: Self-pay | Admitting: Internal Medicine

## 2022-09-23 LAB — CERVICOVAGINAL ANCILLARY ONLY
Bacterial Vaginitis (gardnerella): NEGATIVE
Candida Glabrata: POSITIVE — AB
Candida Vaginitis: POSITIVE — AB
Chlamydia: NEGATIVE
Comment: NEGATIVE
Comment: NEGATIVE
Comment: NEGATIVE
Comment: NEGATIVE
Comment: NEGATIVE
Comment: NORMAL
Neisseria Gonorrhea: NEGATIVE
Trichomonas: NEGATIVE

## 2022-09-23 MED ORDER — FLUCONAZOLE 150 MG PO TABS
150.0000 mg | ORAL_TABLET | Freq: Every day | ORAL | 0 refills | Status: DC
  Filled 2022-09-23: qty 2, 2d supply, fill #0

## 2022-09-24 ENCOUNTER — Other Ambulatory Visit (HOSPITAL_COMMUNITY): Payer: Self-pay

## 2022-09-24 ENCOUNTER — Other Ambulatory Visit: Payer: Self-pay | Admitting: Internal Medicine

## 2022-09-24 ENCOUNTER — Ambulatory Visit: Payer: Medicaid Other | Attending: Internal Medicine

## 2022-09-24 DIAGNOSIS — E782 Mixed hyperlipidemia: Secondary | ICD-10-CM | POA: Diagnosis not present

## 2022-09-24 DIAGNOSIS — E890 Postprocedural hypothyroidism: Secondary | ICD-10-CM | POA: Diagnosis not present

## 2022-09-24 DIAGNOSIS — R400 Somnolence: Secondary | ICD-10-CM

## 2022-09-24 DIAGNOSIS — N6002 Solitary cyst of left breast: Secondary | ICD-10-CM

## 2022-09-24 DIAGNOSIS — Z1231 Encounter for screening mammogram for malignant neoplasm of breast: Secondary | ICD-10-CM

## 2022-09-25 ENCOUNTER — Other Ambulatory Visit: Payer: Self-pay | Admitting: Internal Medicine

## 2022-09-25 ENCOUNTER — Other Ambulatory Visit (HOSPITAL_COMMUNITY): Payer: Self-pay

## 2022-09-25 DIAGNOSIS — E890 Postprocedural hypothyroidism: Secondary | ICD-10-CM

## 2022-09-25 LAB — COMPREHENSIVE METABOLIC PANEL
ALT: 14 IU/L (ref 0–32)
AST: 15 IU/L (ref 0–40)
Albumin/Globulin Ratio: 2 (ref 1.2–2.2)
Albumin: 4.5 g/dL (ref 3.8–4.9)
Alkaline Phosphatase: 71 IU/L (ref 44–121)
BUN/Creatinine Ratio: 12 (ref 9–23)
BUN: 10 mg/dL (ref 6–24)
Bilirubin Total: <0.2 mg/dL (ref 0.0–1.2)
CO2: 23 mmol/L (ref 20–29)
Calcium: 8.6 mg/dL — ABNORMAL LOW (ref 8.7–10.2)
Chloride: 104 mmol/L (ref 96–106)
Creatinine, Ser: 0.84 mg/dL (ref 0.57–1.00)
Globulin, Total: 2.3 g/dL (ref 1.5–4.5)
Glucose: 97 mg/dL (ref 70–99)
Potassium: 3.9 mmol/L (ref 3.5–5.2)
Sodium: 143 mmol/L (ref 134–144)
Total Protein: 6.8 g/dL (ref 6.0–8.5)
eGFR: 81 mL/min/{1.73_m2} (ref >59)

## 2022-09-25 LAB — LIPID PANEL
Chol/HDL Ratio: 4.2 ratio (ref 0.0–4.4)
Cholesterol, Total: 235 mg/dL — ABNORMAL HIGH (ref 100–199)
HDL: 56 mg/dL (ref >39)
LDL Chol Calc (NIH): 165 mg/dL — ABNORMAL HIGH (ref 0–99)
Triglycerides: 79 mg/dL (ref 0–149)
VLDL Cholesterol Cal: 14 mg/dL (ref 5–40)

## 2022-09-25 LAB — CBC
Hematocrit: 46.2 % (ref 34.0–46.6)
Hemoglobin: 15.4 g/dL (ref 11.1–15.9)
MCH: 29.1 pg (ref 26.6–33.0)
MCHC: 33.3 g/dL (ref 31.5–35.7)
MCV: 87 fL (ref 79–97)
Platelets: 188 10*3/uL (ref 150–450)
RBC: 5.29 x10E6/uL — ABNORMAL HIGH (ref 3.77–5.28)
RDW: 13.1 % (ref 11.7–15.4)
WBC: 5.4 10*3/uL (ref 3.4–10.8)

## 2022-09-25 LAB — TSH: TSH: 5.62 u[IU]/mL — ABNORMAL HIGH (ref 0.450–4.500)

## 2022-09-25 MED ORDER — LEVOTHYROXINE SODIUM 125 MCG PO TABS
125.0000 ug | ORAL_TABLET | Freq: Every day | ORAL | 6 refills | Status: DC
  Filled 2022-09-25: qty 30, 30d supply, fill #0

## 2022-09-25 NOTE — Progress Notes (Signed)
Let patient know that her thyroid level has improved but still not at goal.  If she has been taking the levothyroxine consistently, I recommend increasing the dose from 112 mcg daily to 125 mcg daily.  An updated prescription will be sent to her pharmacy.  Cholesterol level is 165 with goal being less than 70.  Strongly encouraged her to take the pravastatin as prescribed.  Kidney and liver function tests are good.  Calcium level slightly low.  Likely due to lack of vitamin D.  I recommend taking vitamin D 400 IU daily.  This can be purchased over-the-counter.

## 2022-10-01 ENCOUNTER — Other Ambulatory Visit: Payer: Self-pay | Admitting: Internal Medicine

## 2022-10-01 DIAGNOSIS — N6002 Solitary cyst of left breast: Secondary | ICD-10-CM

## 2022-11-08 ENCOUNTER — Other Ambulatory Visit: Payer: Self-pay

## 2022-11-08 ENCOUNTER — Inpatient Hospital Stay (HOSPITAL_COMMUNITY)
Admission: EM | Admit: 2022-11-08 | Discharge: 2022-11-14 | DRG: 065 | Disposition: A | Discharge status: Home / Self Care | Payer: Medicaid Other | Attending: Internal Medicine | Admitting: Internal Medicine

## 2022-11-08 ENCOUNTER — Encounter (HOSPITAL_COMMUNITY): Payer: Self-pay

## 2022-11-08 DIAGNOSIS — E890 Postprocedural hypothyroidism: Secondary | ICD-10-CM | POA: Diagnosis present

## 2022-11-08 DIAGNOSIS — F1410 Cocaine abuse, uncomplicated: Secondary | ICD-10-CM | POA: Diagnosis present

## 2022-11-08 DIAGNOSIS — I639 Cerebral infarction, unspecified: Secondary | ICD-10-CM | POA: Diagnosis not present

## 2022-11-08 DIAGNOSIS — I10 Essential (primary) hypertension: Secondary | ICD-10-CM | POA: Diagnosis present

## 2022-11-08 DIAGNOSIS — Z79890 Hormone replacement therapy (postmenopausal): Secondary | ICD-10-CM

## 2022-11-08 DIAGNOSIS — Z833 Family history of diabetes mellitus: Secondary | ICD-10-CM

## 2022-11-08 DIAGNOSIS — R4781 Slurred speech: Secondary | ICD-10-CM | POA: Diagnosis present

## 2022-11-08 DIAGNOSIS — I499 Cardiac arrhythmia, unspecified: Secondary | ICD-10-CM | POA: Diagnosis not present

## 2022-11-08 DIAGNOSIS — Z79899 Other long term (current) drug therapy: Secondary | ICD-10-CM

## 2022-11-08 DIAGNOSIS — I6329 Cerebral infarction due to unspecified occlusion or stenosis of other precerebral arteries: Principal | ICD-10-CM | POA: Diagnosis present

## 2022-11-08 DIAGNOSIS — F17200 Nicotine dependence, unspecified, uncomplicated: Secondary | ICD-10-CM | POA: Diagnosis present

## 2022-11-08 DIAGNOSIS — Z87440 Personal history of urinary (tract) infections: Secondary | ICD-10-CM

## 2022-11-08 DIAGNOSIS — Z91128 Patient's intentional underdosing of medication regimen for other reason: Secondary | ICD-10-CM

## 2022-11-08 DIAGNOSIS — I161 Hypertensive emergency: Secondary | ICD-10-CM | POA: Diagnosis present

## 2022-11-08 DIAGNOSIS — Z823 Family history of stroke: Secondary | ICD-10-CM

## 2022-11-08 DIAGNOSIS — E785 Hyperlipidemia, unspecified: Secondary | ICD-10-CM | POA: Diagnosis present

## 2022-11-08 DIAGNOSIS — R29703 NIHSS score 3: Secondary | ICD-10-CM | POA: Diagnosis present

## 2022-11-08 DIAGNOSIS — Z803 Family history of malignant neoplasm of breast: Secondary | ICD-10-CM

## 2022-11-08 DIAGNOSIS — Z800 Family history of malignant neoplasm of digestive organs: Secondary | ICD-10-CM

## 2022-11-08 DIAGNOSIS — R202 Paresthesia of skin: Secondary | ICD-10-CM | POA: Diagnosis not present

## 2022-11-08 DIAGNOSIS — T466X6A Underdosing of antihyperlipidemic and antiarteriosclerotic drugs, initial encounter: Secondary | ICD-10-CM | POA: Diagnosis present

## 2022-11-08 DIAGNOSIS — Z85850 Personal history of malignant neoplasm of thyroid: Secondary | ICD-10-CM

## 2022-11-08 DIAGNOSIS — D751 Secondary polycythemia: Secondary | ICD-10-CM | POA: Diagnosis present

## 2022-11-08 DIAGNOSIS — Z8042 Family history of malignant neoplasm of prostate: Secondary | ICD-10-CM

## 2022-11-08 DIAGNOSIS — Z7982 Long term (current) use of aspirin: Secondary | ICD-10-CM

## 2022-11-08 DIAGNOSIS — Z8249 Family history of ischemic heart disease and other diseases of the circulatory system: Secondary | ICD-10-CM

## 2022-11-08 DIAGNOSIS — H532 Diplopia: Secondary | ICD-10-CM | POA: Diagnosis present

## 2022-11-08 DIAGNOSIS — Z888 Allergy status to other drugs, medicaments and biological substances status: Secondary | ICD-10-CM

## 2022-11-08 DIAGNOSIS — G8194 Hemiplegia, unspecified affecting left nondominant side: Secondary | ICD-10-CM | POA: Diagnosis present

## 2022-11-08 DIAGNOSIS — F17210 Nicotine dependence, cigarettes, uncomplicated: Secondary | ICD-10-CM | POA: Diagnosis present

## 2022-11-08 DIAGNOSIS — F1010 Alcohol abuse, uncomplicated: Secondary | ICD-10-CM | POA: Diagnosis present

## 2022-11-08 DIAGNOSIS — Z8673 Personal history of transient ischemic attack (TIA), and cerebral infarction without residual deficits: Secondary | ICD-10-CM

## 2022-11-08 LAB — CBC WITH DIFFERENTIAL/PLATELET
Abs Immature Granulocytes: 0.01 10*3/uL (ref 0.00–0.07)
Basophils Absolute: 0 10*3/uL (ref 0.0–0.1)
Basophils Relative: 0 %
Eosinophils Absolute: 0.1 10*3/uL (ref 0.0–0.5)
Eosinophils Relative: 1 %
HCT: 49.1 % — ABNORMAL HIGH (ref 36.0–46.0)
Hemoglobin: 16.2 g/dL — ABNORMAL HIGH (ref 12.0–15.0)
Immature Granulocytes: 0 %
Lymphocytes Relative: 31 %
Lymphs Abs: 1.7 10*3/uL (ref 0.7–4.0)
MCH: 28.4 pg (ref 26.0–34.0)
MCHC: 33 g/dL (ref 30.0–36.0)
MCV: 86 fL (ref 80.0–100.0)
Monocytes Absolute: 0.4 10*3/uL (ref 0.1–1.0)
Monocytes Relative: 7 %
Neutro Abs: 3.4 10*3/uL (ref 1.7–7.7)
Neutrophils Relative %: 61 %
Platelets: 196 10*3/uL (ref 150–400)
RBC: 5.71 MIL/uL — ABNORMAL HIGH (ref 3.87–5.11)
RDW: 13.1 % (ref 11.5–15.5)
WBC: 5.6 10*3/uL (ref 4.0–10.5)
nRBC: 0 % (ref 0.0–0.2)

## 2022-11-08 LAB — RAPID URINE DRUG SCREEN, HOSP PERFORMED
Amphetamines: NOT DETECTED
Barbiturates: NOT DETECTED
Benzodiazepines: NOT DETECTED
Cocaine: POSITIVE — AB
Opiates: NOT DETECTED
Tetrahydrocannabinol: NOT DETECTED

## 2022-11-08 LAB — COMPREHENSIVE METABOLIC PANEL
ALT: 14 U/L (ref 0–44)
AST: 15 U/L (ref 15–41)
Albumin: 4.1 g/dL (ref 3.5–5.0)
Alkaline Phosphatase: 55 U/L (ref 38–126)
Anion gap: 12 (ref 5–15)
BUN: 11 mg/dL (ref 6–20)
CO2: 25 mmol/L (ref 22–32)
Calcium: 8.5 mg/dL — ABNORMAL LOW (ref 8.9–10.3)
Chloride: 105 mmol/L (ref 98–111)
Creatinine, Ser: 0.9 mg/dL (ref 0.44–1.00)
GFR, Estimated: >60 mL/min (ref >60)
Glucose, Bld: 102 mg/dL — ABNORMAL HIGH (ref 70–99)
Potassium: 3.8 mmol/L (ref 3.5–5.1)
Sodium: 142 mmol/L (ref 135–145)
Total Bilirubin: 0.8 mg/dL (ref 0.3–1.2)
Total Protein: 6.7 g/dL (ref 6.5–8.1)

## 2022-11-08 LAB — ETHANOL: Alcohol, Ethyl (B): <10 mg/dL (ref <10)

## 2022-11-08 NOTE — ED Provider Triage Note (Signed)
Emergency Medicine Provider Triage Evaluation Note  Theresa Watkins , a 56 y.o. female  was evaluated in triage.  Pt complains of dizziness and generalized weakness.  She reports she thinks that she is having a stroke due to this weakness but she also disclosed to me that prior to weakness and difficulty walking she consume large amounts of alcohol.  She also reported a recent use of crack cocaine.  Denied use of any other substance.  Denies any changes to vision, chest pain, shortness of breath, abdominal pain.  Patient would not disclose to me exact quantity which she ingested or reported to me that she drank several different types of alcohol.  Review of Systems  Positive: As above Negative: As above  Physical Exam  BP (!) 167/94 (BP Location: Right Arm)   Pulse 69   Temp 98.7 F (37.1 C) (Oral)   Resp 18   Ht '5\' 5"'$  (1.651 m)   Wt 72.6 kg   SpO2 100%   BMI 26.63 kg/m  Gen:   Awake, no distress, tangential thought process Resp:  Normal effort MSK:   Moves extremities without difficulty, difficulty standing due to acute intoxication Other:    Medical Decision Making  Medically screening exam initiated at 6:34 PM.  Appropriate orders placed.  [name] was informed that the remainder of the evaluation will be completed by another provider, this initial triage assessment does not replace that evaluation, and the importance of remaining in the ED until their evaluation is complete.     Luvenia Heller, PA-C 11/08/22 [phone]

## 2022-11-08 NOTE — ED Triage Notes (Addendum)
Pt to ED via [facility]. Pt states she feels like she is having a stroke, numbness and tingling in all extremities, unfocusable vision, started yesterday morning.   Pt states she feels like she is having a stroke because she started having dizziness and weakness.  Pt states she does not want to talk about this anymore, refuses to answer questions.   EMS VS Cbg=118 142/78 HR=86

## 2022-11-09 ENCOUNTER — Observation Stay (HOSPITAL_COMMUNITY): Payer: Medicaid Other

## 2022-11-09 ENCOUNTER — Emergency Department (HOSPITAL_COMMUNITY): Payer: Medicaid Other

## 2022-11-09 DIAGNOSIS — F17200 Nicotine dependence, unspecified, uncomplicated: Secondary | ICD-10-CM | POA: Diagnosis not present

## 2022-11-09 DIAGNOSIS — F1010 Alcohol abuse, uncomplicated: Secondary | ICD-10-CM

## 2022-11-09 DIAGNOSIS — I639 Cerebral infarction, unspecified: Secondary | ICD-10-CM | POA: Diagnosis not present

## 2022-11-09 DIAGNOSIS — I10 Essential (primary) hypertension: Secondary | ICD-10-CM | POA: Diagnosis not present

## 2022-11-09 DIAGNOSIS — E890 Postprocedural hypothyroidism: Secondary | ICD-10-CM

## 2022-11-09 DIAGNOSIS — F1410 Cocaine abuse, uncomplicated: Secondary | ICD-10-CM

## 2022-11-09 DIAGNOSIS — E785 Hyperlipidemia, unspecified: Secondary | ICD-10-CM

## 2022-11-09 DIAGNOSIS — R55 Syncope and collapse: Secondary | ICD-10-CM | POA: Diagnosis not present

## 2022-11-09 DIAGNOSIS — R0600 Dyspnea, unspecified: Secondary | ICD-10-CM | POA: Diagnosis not present

## 2022-11-09 DIAGNOSIS — I672 Cerebral atherosclerosis: Secondary | ICD-10-CM | POA: Diagnosis not present

## 2022-11-09 DIAGNOSIS — I6612 Occlusion and stenosis of left anterior cerebral artery: Secondary | ICD-10-CM | POA: Diagnosis not present

## 2022-11-09 DIAGNOSIS — R29818 Other symptoms and signs involving the nervous system: Secondary | ICD-10-CM | POA: Diagnosis not present

## 2022-11-09 LAB — MAGNESIUM: Magnesium: 1.9 mg/dL (ref 1.7–2.4)

## 2022-11-09 LAB — TROPONIN I (HIGH SENSITIVITY): Troponin I (High Sensitivity): 6 ng/L (ref <18)

## 2022-11-09 MED ORDER — CLOPIDOGREL BISULFATE 75 MG PO TABS
75.0000 mg | ORAL_TABLET | Freq: Every day | ORAL | Status: DC
  Administered 2022-11-09 – 2022-11-14 (×6): 75 mg via ORAL
  Filled 2022-11-09 (×6): qty 1

## 2022-11-09 MED ORDER — ACETAMINOPHEN 650 MG RE SUPP: 650.0000 mg | RECTAL | Status: DC | PRN

## 2022-11-09 MED ORDER — STROKE: EARLY STAGES OF RECOVERY BOOK
Freq: Once | Status: AC
  Filled 2022-11-09: qty 1

## 2022-11-09 MED ORDER — ENOXAPARIN SODIUM 40 MG/0.4ML IJ SOSY
40.0000 mg | PREFILLED_SYRINGE | INTRAMUSCULAR | Status: DC
  Administered 2022-11-10 – 2022-11-14 (×5): 40 mg via SUBCUTANEOUS
  Filled 2022-11-09 (×5): qty 0.4

## 2022-11-09 MED ORDER — LACTATED RINGERS IV BOLUS: 1000.0000 mL | Freq: Once | INTRAVENOUS | Status: DC

## 2022-11-09 MED ORDER — LORAZEPAM 0.5 MG PO TABS: 0.5000 mg | ORAL_TABLET | ORAL | Status: DC | PRN

## 2022-11-09 MED ORDER — ACETAMINOPHEN 325 MG PO TABS
650.0000 mg | ORAL_TABLET | ORAL | Status: DC | PRN
  Administered 2022-11-12 – 2022-11-14 (×5): 650 mg via ORAL
  Filled 2022-11-09 (×6): qty 2

## 2022-11-09 MED ORDER — ASPIRIN 81 MG PO TBEC
81.0000 mg | DELAYED_RELEASE_TABLET | Freq: Every day | ORAL | Status: DC
  Administered 2022-11-09 – 2022-11-14 (×6): 81 mg via ORAL
  Filled 2022-11-09 (×6): qty 1

## 2022-11-09 MED ORDER — PRAVASTATIN SODIUM 40 MG PO TABS
20.0000 mg | ORAL_TABLET | Freq: Every day | ORAL | Status: DC
  Filled 2022-11-09: qty 1

## 2022-11-09 MED ORDER — ACETAMINOPHEN 160 MG/5ML PO SOLN: 650.0000 mg | ORAL | Status: DC | PRN

## 2022-11-09 MED ORDER — LORAZEPAM 2 MG/ML IJ SOLN: 1.0000 mg | INTRAMUSCULAR | Status: DC | PRN

## 2022-11-09 MED ORDER — LEVOTHYROXINE SODIUM 25 MCG PO TABS
125.0000 ug | ORAL_TABLET | Freq: Every day | ORAL | Status: DC
  Administered 2022-11-10 – 2022-11-14 (×5): 125 ug via ORAL
  Filled 2022-11-09 (×5): qty 1

## 2022-11-09 NOTE — Assessment & Plan Note (Signed)
Continue synthroid.

## 2022-11-09 NOTE — ED Notes (Signed)
Patient transported to CT 

## 2022-11-09 NOTE — Assessment & Plan Note (Signed)
Also smokes 1 pk/week still per patient.

## 2022-11-09 NOTE — Assessment & Plan Note (Addendum)
Hold home norvasc and allow permissive HTN in setting of suspected acute ischemic stroke.

## 2022-11-09 NOTE — Assessment & Plan Note (Addendum)
Acute to subacute strokes on CT scan.  Diplopia with EOM impairment for past couple of days. H/o stroke in 2017 as well. Stroke pathway Neuro consult Tele monitor 2d echo MRI brain MRA head and neck w/o contrast (Pt with allergies to both gadolinium and iohexol it seems). PT/OT/SLP ASA 81 Plavix 75 for 21 days FLP and A1C Allow permissive HTN for the moment pending MRI results.

## 2022-11-09 NOTE — Assessment & Plan Note (Signed)
Resume pravastatin 20 Check FLP.

## 2022-11-09 NOTE — ED Provider Notes (Signed)
[facility] EMERGENCY DEPARTMENT Provider Note   CSN: [id number] Arrival date & time: 11/08/22  1745     History  Chief Complaint  Patient presents with   Dizziness    Theresa Watkins is a 56 y.o. female.   Dizziness Associated symptoms: weakness      56 year old female with medical history significant for prior CVA, presenting to the emergency department with multiple complaints.  The patient states that for the last few days she has had lightheadedness and generalized weakness.  She also endorses increased numbness in the left arm and left leg and heaviness.  She is thinks that symptoms came on yesterday morning.  She endorses recent use of crack cocaine.  She denies any chest pain, shortness of breath.  She denies any headaches.  Since since this past Wednesday, she has noted that her left eye will not adduct fully and she endorses double vision.  She is worried she has had a recurrent stroke.  Home Medications Prior to Admission medications   Medication Sig Start Date End Date Taking? Authorizing Provider  amLODipine (NORVASC) 5 MG tablet Take 1 tablet (5 mg total) by mouth daily. Patient not taking: Reported on 10/01/2021 06/01/21   Ladell Pier, MD  aspirin 325 MG tablet Take 325 mg by mouth daily.    [provider]  levothyroxine (SYNTHROID) 125 MCG tablet Take 1 tablet (125 mcg total) by mouth daily before breakfast. 09/25/22   Ladell Pier, MD  pravastatin (PRAVACHOL) 20 MG tablet TAKE 1 TABLET (20 MG TOTAL) BY MOUTH DAILY. Patient not taking: Reported on 09/19/2022 07/16/22 07/16/23  Ladell Pier, MD  vitamin C (ASCORBIC ACID) 500 MG tablet Take 500 mg by mouth daily.    [provider]  atorvastatin (LIPITOR) 20 MG tablet Take 1 tablet (20 mg total) by mouth daily. 09/23/20 12/06/20  Ladell Pier, MD      Allergies    Gadolinium derivatives, Iohexol, Proanthocyanidin, Grapeseed extract [nutritional supplements],  Diphenhydramine hcl, and Tramadol    Review of Systems   Review of Systems  Eyes:  Positive for visual disturbance.  Neurological:  Positive for weakness, light-headedness and numbness.  All other systems reviewed and are negative.   Physical Exam Updated Vital Signs BP (!) 141/100   Pulse 65   Temp 98.4 F (36.9 C)   Resp (!) 24   Ht '5\' 5"'$  (1.651 m)   Wt 72.6 kg   SpO2 98%   BMI 26.63 kg/m  Physical Exam Vitals and nursing note reviewed.  Constitutional:      General: She is not in acute distress.    Appearance: She is well-developed.  HENT:     Head: Normocephalic and atraumatic.  Eyes:     Conjunctiva/sclera: Conjunctivae normal.  Cardiovascular:     Rate and Rhythm: Normal rate and regular rhythm.     Heart sounds: No murmur heard. Pulmonary:     Effort: Pulmonary effort is normal. No respiratory distress.     Breath sounds: Normal breath sounds.  Abdominal:     Palpations: Abdomen is soft.     Tenderness: There is no abdominal tenderness.  Musculoskeletal:        General: No swelling.     Cervical back: Neck supple.  Skin:    General: Skin is warm and dry.     Capillary Refill: Capillary refill takes less than 2 seconds.  Neurological:     Mental Status: She is alert.  Comments: MENTAL STATUS EXAM:    Orientation: Alert and oriented to person, place and time.  Memory: Cooperative, follows commands well.  Language: Speech is clear and language is normal.   CRANIAL NERVES:    CN 2 (Optic): Visual fields intact to confrontation.  CN 3,4,6 (EOM): Pupils equal and reactive to light. EOM disturbed with left eye not fully adducting CN 5 (Trigeminal): Facial sensation is diminished on the left, no weakness of masticatory muscles.  CN 7 (Facial): No facial weakness or asymmetry.  CN 8 (Auditory): Auditory acuity grossly normal.  CN 9,10 (Glossophar): The uvula is midline, the palate elevates symmetrically.  CN 11 (spinal access): Normal sternocleidomastoid  and trapezius strength.  CN 12 (Hypoglossal): The tongue is midline. No atrophy or fasciculations.Marland Kitchen   MOTOR:  Muscle Strength: 5/5RUE, 4/5LUE, 5/5RLE, 4/5LLE  COORDINATION:   Intact finger-to-nose, no tremor.   SENSATION:   Intact to light touch all four extremities, diminished in the left hemibody.     Psychiatric:        Mood and Affect: Mood normal.     ED Results / Procedures / Treatments   Labs (all labs ordered are listed, but only abnormal results are displayed) Labs Reviewed  COMPREHENSIVE METABOLIC PANEL - Abnormal; Notable for the following components:      Result Value   Glucose, Bld 102 (*)    Calcium 8.5 (*)    All other components within normal limits  CBC WITH DIFFERENTIAL/PLATELET - Abnormal; Notable for the following components:   RBC 5.71 (*)    Hemoglobin 16.2 (*)    HCT 49.1 (*)    All other components within normal limits  RAPID URINE DRUG SCREEN, HOSP PERFORMED - Abnormal; Notable for the following components:   Cocaine POSITIVE (*)    All other components within normal limits  ETHANOL  MAGNESIUM  HIV ANTIBODY (ROUTINE TESTING W REFLEX)  LIPID PANEL  HEMOGLOBIN A1C  TROPONIN I (HIGH SENSITIVITY)    EKG EKG Interpretation  Date/Time:  Saturday November 09 2022 18:01:18 EST Ventricular Rate:  57 PR Interval:  146 QRS Duration: 93 QT Interval:  476 QTC Calculation: 464 R Axis:   -30 Text Interpretation: Sinus rhythm Right atrial enlargement Left axis deviation Borderline T wave abnormalities Confirmed by Regan Lemming ([id number]) on 11/09/2022 6:15:08 PM   Radiology CT HEAD WO CONTRAST (5MM)  Result Date: 11/09/2022 CLINICAL DATA:  Neuro deficit, acute stroke suspected EXAM: CT HEAD WITHOUT CONTRAST TECHNIQUE: Contiguous axial images were obtained from the base of the skull through the vertex without intravenous contrast. RADIATION DOSE REDUCTION: This exam was performed according to the departmental dose-optimization program which includes  automated exposure control, adjustment of the mA and/or kV according to patient size and/or use of iterative reconstruction technique. COMPARISON:  CT brain, 09/05/2020 FINDINGS: Brain: New, although age indeterminate and subacute to chronic appearing infarctions of the right anterior limb of the internal capsule and caudate head as well as the adjacent corona radiata (series 3, image 14). Unchanged chronic left basal ganglia and corona radiata infarctions (series 3, image 18). No hemorrhage, hydrocephalus, extra-axial collection or mass lesion/mass effect. Vascular: No hyperdense vessel or unexpected calcification. Skull: Normal. Negative for fracture or focal lesion. Sinuses/Orbits: No acute finding. Other: None. IMPRESSION: 1. New, although age indeterminate and subacute to chronic appearing infarctions of the right anterior limb of the internal capsule and caudate head as well as the adjacent corona radiata. Consider MRI to evaluate for acutely diffusion restricting infarction if suspected. 2.  Unchanged chronic left basal ganglia and corona radiata infarctions. 3. No acute intracranial hemorrhage. Electronically Signed   By: Delanna Ahmadi M.D.   On: 11/09/2022 19:09   DG Chest Portable 1 View  Result Date: 11/09/2022 CLINICAL DATA:  Presyncope, dyspnea EXAM: PORTABLE CHEST 1 VIEW COMPARISON:  05/25/2018 FINDINGS: The heart size and mediastinal contours are within normal limits. Both lungs are clear. The visualized skeletal structures are unremarkable. Surgical clips again noted at the neck base bilaterally. IMPRESSION: No active disease. Electronically Signed   By: Fidela Salisbury M.D.   On: 11/09/2022 17:47    Procedures Procedures    Medications Ordered in ED Medications  LORazepam (ATIVAN) tablet 0.5 mg (has no administration in time range)  LORazepam (ATIVAN) injection 1 mg (has no administration in time range)  levothyroxine (SYNTHROID) tablet 125 mcg (has no administration in time range)   pravastatin (PRAVACHOL) tablet 20 mg (has no administration in time range)  aspirin EC tablet 81 mg (has no administration in time range)  clopidogrel (PLAVIX) tablet 75 mg (has no administration in time range)   stroke: early stages of recovery book (has no administration in time range)  acetaminophen (TYLENOL) tablet 650 mg (has no administration in time range)    Or  acetaminophen (TYLENOL) 160 MG/5ML solution 650 mg (has no administration in time range)    Or  acetaminophen (TYLENOL) suppository 650 mg (has no administration in time range)  enoxaparin (LOVENOX) injection 40 mg (has no administration in time range)    ED Course/ Medical Decision Making/ A&P                           Medical Decision Making Amount and/or Complexity of Data Reviewed Labs: ordered. Radiology: ordered.  Risk Prescription drug management. Decision regarding hospitalization.    56 year old female with medical history significant for prior CVA, presenting to the emergency department with multiple complaints.  The patient states that for the last few days she has had lightheadedness and generalized weakness.  She also endorses increased numbness in the left arm and left leg and heaviness.  She is thinks that symptoms came on yesterday morning.  She endorses recent use of crack cocaine.  She denies any chest pain, shortness of breath.  She denies any headaches.  Since since this past Wednesday, she has noted that her left eye will not adduct fully and she endorses double vision.  She is worried she has had a recurrent stroke.  On arrival, the patient was vitally stable.  UDS positive for cocaine, CMP generally unremarkable, CBC with evidence of hemoconcentration with a hemoglobin of 16.2, otherwise unremarkable, ethanol level normal, CBG 102.  Physical exam concerning for left hemibody numbness to include left face and left hemibody, weakness that is increased from the patient's baseline.  Additionally, the  patient has new difficulty with adducting the left eye resulting in diplopia.  Concern for new stroke vs recrudescence.  CXR: No active disease  CT Head: IMPRESSION:  1. New, although age indeterminate and subacute to chronic appearing  infarctions of the right anterior limb of the internal capsule and  caudate head as well as the adjacent corona radiata. Consider MRI to  evaluate for acutely diffusion restricting infarction if suspected.  2. Unchanged chronic left basal ganglia and corona radiata  infarctions.  3. No acute intracranial hemorrhage.    I consulted on-call neurology, Dr. Leonel Ramsay who recommended MRI of the brain and admission for stroke  workup. Dr. Alcario Drought of hospitalist medicine was consulted and accepted the patient in admission.   Final Clinical Impression(s) / ED Diagnoses Final diagnoses:  Cerebrovascular accident (CVA), unspecified mechanism [facility])    Rx / [place] Orders ED Discharge Orders     None         Regan Lemming, MD 11/09/22 2032

## 2022-11-09 NOTE — Assessment & Plan Note (Addendum)
Longstanding and ongoing, presumably this + not taking HTN meds regularly = severe uncontrolled HTN which in turn is causing her strokes despite younger age. Pt needs to quit!

## 2022-11-09 NOTE — ED Notes (Signed)
Patient transported to MRI 

## 2022-11-09 NOTE — ED Notes (Signed)
Help get patient straighten up in the bed did EKG shown to er provider gave patient a [place] sandwich patient is now sitting up eating with call bell in reach

## 2022-11-09 NOTE — Assessment & Plan Note (Addendum)
H/o in past, not drinking anymore though per patient.

## 2022-11-09 NOTE — ED Notes (Signed)
Called pt x4 for vitals, no response. 

## 2022-11-09 NOTE — H&P (Signed)
History and Physical    Patient: Theresa Watkins DXA:[id number] DOB: 1966/03/28 DOA: 11/08/2022 DOS: the patient was seen and examined on 11/09/2022 PCP: Ladell Pier, MD  Patient coming from: Home  Chief Complaint:  Chief Complaint  Patient presents with   Dizziness   HPI: Theresa Watkins is a 56 y.o. female with medical history significant of prior stroke in 2017, ongoing cocaine abuse, ongoing smoking, HTN and HLD not regularly taking her meds, former EtOH abuse though not any more per pt.  Pt presents to ED with c/o lightheadedness, generalized weakness, L arm and Leg weakness and heaviness.  Inability to adduct L eye with resulting diplopia.  L eye symptoms onset Wed.  Weakness symptoms onset yesterday AM.  Worried she is having a stroke.   Review of Systems: As mentioned in the history of present illness. All other systems reviewed and are negative. Past Medical History:  Diagnosis Date   Abnormal Pap smear    Bronchitis    Fibroid    Headache(784.0)    Ovarian cyst    Plantar fasciitis    Seizures ([place])    Stroke [facility])    Thyroid cancer ([place]) [address]   Trichomonas    Urinary tract infection    Past Surgical History:  Procedure Laterality Date   ABDOMINAL HYSTERECTOMY     CESAREAN SECTION     goiter     removed   laparoscopic  1988   removal  of ectopic preg, ruptured tube   THERAPEUTIC ABORTION     THYROIDECTOMY  march 2011   cancer   Social History:  reports that she has been smoking cigarettes. She has a 2.50 pack-year smoking history. She has never used smokeless tobacco. She reports current alcohol use. She reports current drug use. Drug: Cocaine.  Allergies  Allergen Reactions   Gadolinium Derivatives Hives, Itching and Swelling    After MultiHance (gadolinium) injection, pt began sneezing.  After exam, pt told MR tech Burna Mortimer that she was itching.  Dr. Owens Shark evaluated pt, and noticed hives on pt's back.  Pt said that she felt swelling in her  throat.  Dr. Owens Shark directed that pt be taken to hospital via ambulance.     Iohexol Hives, Itching and Other (See Comments)     Code: HIVES, Desc: pts tongue began itching post injection and throat burning, Onset Date: [id number]    Proanthocyanidin Swelling    Swelling of the tongue   Grapeseed Extract [Nutritional Supplements]     Tongue swelll   Diphenhydramine Hcl Rash   Tramadol Rash    Family History  Problem Relation Age of Onset   Bell's palsy Mother    Hypertension Mother    Diabetes Mother    Stroke Father    Prostate cancer Father    Breast cancer Cousin    Hypertension Sister    Colon cancer Maternal Aunt    Colon cancer Paternal Aunt    Diverticulitis Sister    Diabetes Sister     Prior to Admission medications   Medication Sig Start Date End Date Taking? Authorizing Provider  amLODipine (NORVASC) 5 MG tablet Take 1 tablet (5 mg total) by mouth daily. Patient not taking: Reported on 10/01/2021 06/01/21   Ladell Pier, MD  aspirin 325 MG tablet Take 325 mg by mouth daily.    [provider]  levothyroxine (SYNTHROID) 125 MCG tablet Take 1 tablet (125 mcg total) by mouth daily before breakfast. 09/25/22   Ladell Pier,  MD  pravastatin (PRAVACHOL) 20 MG tablet TAKE 1 TABLET (20 MG TOTAL) BY MOUTH DAILY. Patient not taking: Reported on 09/19/2022 07/16/22 07/16/23  Ladell Pier, MD  vitamin C (ASCORBIC ACID) 500 MG tablet Take 500 mg by mouth daily.    [provider]  atorvastatin (LIPITOR) 20 MG tablet Take 1 tablet (20 mg total) by mouth daily. 09/23/20 12/06/20  Ladell Pier, MD    Physical Exam: Vitals:   11/09/22 1803 11/09/22 1804 11/09/22 1806 11/09/22 1810  BP: (!) 154/80 (!) 155/84 (!) 141/100   Pulse: (!) 54 (!) 59 65   Resp: 15 (!) 27 (!) 24   Temp:    98.4 F (36.9 C)  TempSrc:      SpO2: 99% 98% 98%   Weight:      Height:       Constitutional: NAD, calm, comfortable Eyes: Unable to fully adduct the L  eye. ENMT: Mucous membranes are moist. Posterior pharynx clear of any exudate or lesions.Normal dentition.  Neck: normal, supple, no masses, no thyromegaly Respiratory: clear to auscultation bilaterally, no wheezing, no crackles. Normal respiratory effort. No accessory muscle use.  Cardiovascular: Regular rate and rhythm, no murmurs / rubs / gallops. No extremity edema. 2+ pedal pulses. No carotid bruits.  Abdomen: no tenderness, no masses palpated. No hepatosplenomegaly. Bowel sounds positive.  Musculoskeletal: no clubbing / cyanosis. No joint deformity upper and lower extremities. Good ROM, no contractures. Normal muscle tone.  Skin: no rashes, lesions, ulcers. No induration Neurologic: 5/5 strength on R, 4/5 on LUE and LLE.  Sensation diminished L hemibody. Psychiatric: Normal judgment and insight. Alert and oriented x 3. Normal mood.   Data Reviewed:     CT head: IMPRESSION: 1. New, although age indeterminate and subacute to chronic appearing infarctions of the right anterior limb of the internal capsule and caudate head as well as the adjacent corona radiata. Consider MRI to evaluate for acutely diffusion restricting infarction if suspected. 2. Unchanged chronic left basal ganglia and corona radiata infarctions. 3. No acute intracranial hemorrhage.  Assessment and Plan: Acute ischemic stroke ([place]) Acute to subacute strokes on CT scan.  Diplopia with EOM impairment for past couple of days. H/o stroke in 2017 as well. Stroke pathway Neuro consult Tele monitor 2d echo MRI brain MRA head and neck w/o contrast (Pt with allergies to both gadolinium and iohexol it seems). PT/OT/SLP ASA 81 Plavix 75 for 21 days FLP and A1C Allow permissive HTN for the moment pending MRI results.  Cocaine abuse (HCC) Longstanding and ongoing, presumably this + not taking HTN meds regularly = severe uncontrolled HTN which in turn is causing her strokes despite younger age. Pt needs to  quit!  Tobacco dependence Also smokes 1 pk/week still per patient.  Essential hypertension Hold home norvasc and allow permissive HTN in setting of suspected acute ischemic stroke.  HLD (hyperlipidemia) Resume pravastatin 20 Check FLP.  ETOH abuse H/o in past, not drinking anymore though per patient.  Postoperative hypothyroidism Continue synthroid      Advance Care Planning:   Code Status: Full Code  Consults: Dr. Leonel Ramsay  Family Communication: Son arrived to bedside after seeing patient, patient didn't indicate that we could talk to son about the cocaine issue.  (Though he clearly knows she has a history of difficulty with addiction and clearly suspects this may be ongoing).  Severity of Illness: The appropriate patient status for this patient is OBSERVATION. Observation status is judged to be reasonable and necessary in  order to provide the required intensity of service to ensure the patient's safety. The patient's presenting symptoms, physical exam findings, and initial radiographic and laboratory data in the context of their medical condition is felt to place them at decreased risk for further clinical deterioration. Furthermore, it is anticipated that the patient will be medically stable for discharge from the hospital within 2 midnights of admission.   Author: Etta Quill., DO 11/09/2022 8:26 PM  For on call review [email or web address].

## 2022-11-10 ENCOUNTER — Observation Stay (HOSPITAL_BASED_OUTPATIENT_CLINIC_OR_DEPARTMENT_OTHER): Payer: Medicaid Other

## 2022-11-10 DIAGNOSIS — R0600 Dyspnea, unspecified: Secondary | ICD-10-CM | POA: Diagnosis not present

## 2022-11-10 DIAGNOSIS — H532 Diplopia: Secondary | ICD-10-CM | POA: Diagnosis not present

## 2022-11-10 DIAGNOSIS — I6329 Cerebral infarction due to unspecified occlusion or stenosis of other precerebral arteries: Secondary | ICD-10-CM | POA: Diagnosis not present

## 2022-11-10 DIAGNOSIS — I639 Cerebral infarction, unspecified: Secondary | ICD-10-CM | POA: Diagnosis present

## 2022-11-10 DIAGNOSIS — R42 Dizziness and giddiness: Secondary | ICD-10-CM | POA: Diagnosis not present

## 2022-11-10 DIAGNOSIS — Z79899 Other long term (current) drug therapy: Secondary | ICD-10-CM | POA: Diagnosis not present

## 2022-11-10 DIAGNOSIS — E785 Hyperlipidemia, unspecified: Secondary | ICD-10-CM | POA: Diagnosis not present

## 2022-11-10 DIAGNOSIS — G8194 Hemiplegia, unspecified affecting left nondominant side: Secondary | ICD-10-CM | POA: Diagnosis not present

## 2022-11-10 DIAGNOSIS — D751 Secondary polycythemia: Secondary | ICD-10-CM | POA: Diagnosis not present

## 2022-11-10 DIAGNOSIS — Z87440 Personal history of urinary (tract) infections: Secondary | ICD-10-CM | POA: Diagnosis not present

## 2022-11-10 DIAGNOSIS — Z888 Allergy status to other drugs, medicaments and biological substances status: Secondary | ICD-10-CM | POA: Diagnosis not present

## 2022-11-10 DIAGNOSIS — Z8673 Personal history of transient ischemic attack (TIA), and cerebral infarction without residual deficits: Secondary | ICD-10-CM

## 2022-11-10 DIAGNOSIS — Z7982 Long term (current) use of aspirin: Secondary | ICD-10-CM | POA: Diagnosis not present

## 2022-11-10 DIAGNOSIS — F17200 Nicotine dependence, unspecified, uncomplicated: Secondary | ICD-10-CM | POA: Diagnosis not present

## 2022-11-10 DIAGNOSIS — E890 Postprocedural hypothyroidism: Secondary | ICD-10-CM | POA: Diagnosis not present

## 2022-11-10 DIAGNOSIS — I10 Essential (primary) hypertension: Secondary | ICD-10-CM

## 2022-11-10 DIAGNOSIS — R4781 Slurred speech: Secondary | ICD-10-CM | POA: Diagnosis not present

## 2022-11-10 DIAGNOSIS — Z833 Family history of diabetes mellitus: Secondary | ICD-10-CM | POA: Diagnosis not present

## 2022-11-10 DIAGNOSIS — T466X6A Underdosing of antihyperlipidemic and antiarteriosclerotic drugs, initial encounter: Secondary | ICD-10-CM | POA: Diagnosis not present

## 2022-11-10 DIAGNOSIS — F1410 Cocaine abuse, uncomplicated: Secondary | ICD-10-CM | POA: Diagnosis present

## 2022-11-10 DIAGNOSIS — R55 Syncope and collapse: Secondary | ICD-10-CM | POA: Diagnosis not present

## 2022-11-10 DIAGNOSIS — Z79890 Hormone replacement therapy (postmenopausal): Secondary | ICD-10-CM | POA: Diagnosis not present

## 2022-11-10 DIAGNOSIS — F17210 Nicotine dependence, cigarettes, uncomplicated: Secondary | ICD-10-CM | POA: Diagnosis not present

## 2022-11-10 DIAGNOSIS — Z8249 Family history of ischemic heart disease and other diseases of the circulatory system: Secondary | ICD-10-CM | POA: Diagnosis not present

## 2022-11-10 DIAGNOSIS — Z91128 Patient's intentional underdosing of medication regimen for other reason: Secondary | ICD-10-CM | POA: Diagnosis not present

## 2022-11-10 DIAGNOSIS — R29818 Other symptoms and signs involving the nervous system: Secondary | ICD-10-CM | POA: Diagnosis not present

## 2022-11-10 DIAGNOSIS — R29703 NIHSS score 3: Secondary | ICD-10-CM | POA: Diagnosis not present

## 2022-11-10 DIAGNOSIS — Z85850 Personal history of malignant neoplasm of thyroid: Secondary | ICD-10-CM | POA: Diagnosis not present

## 2022-11-10 DIAGNOSIS — I161 Hypertensive emergency: Secondary | ICD-10-CM | POA: Diagnosis not present

## 2022-11-10 DIAGNOSIS — F1010 Alcohol abuse, uncomplicated: Secondary | ICD-10-CM | POA: Diagnosis present

## 2022-11-10 LAB — ECHOCARDIOGRAM COMPLETE
Area-P 1/2: 2.62 cm2
Height: 65 in
S' Lateral: 2.7 cm
Weight: 2560 oz

## 2022-11-10 LAB — LIPID PANEL
Cholesterol: 223 mg/dL — ABNORMAL HIGH (ref 0–200)
HDL: 51 mg/dL (ref >40)
LDL Cholesterol: 152 mg/dL — ABNORMAL HIGH (ref 0–99)
Total CHOL/HDL Ratio: 4.4 RATIO
Triglycerides: 100 mg/dL (ref <150)
VLDL: 20 mg/dL (ref 0–40)

## 2022-11-10 LAB — HIV ANTIBODY (ROUTINE TESTING W REFLEX): HIV Screen 4th Generation wRfx: NONREACTIVE

## 2022-11-10 MED ORDER — ROSUVASTATIN CALCIUM 20 MG PO TABS
40.0000 mg | ORAL_TABLET | Freq: Every day | ORAL | Status: DC
  Administered 2022-11-10 – 2022-11-14 (×5): 40 mg via ORAL
  Filled 2022-11-10 (×5): qty 2

## 2022-11-10 NOTE — Evaluation (Signed)
Physical Therapy Evaluation Patient Details Name: Theresa Watkins MRN: [id number] DOB: May 01, 1966 Today's Date: 11/10/2022  History of Present Illness  Patient is a 56 y/o female who presents on 12/22 with dizziness, left sided weakness/numbness and diploplia.  + cocaine. 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 PMH includes CVA, HTN, thyroid cancer, cocaine use, seizures, UTI.  Clinical Impression  Patient presents with visual deficits, left sided weakness/numbness, impaired balance, headache and impaired mobility s/p above. Pt lives home alone and reports being independent for ADLs and IADLs at baseline. Pt does not drive; mom assists with this. Today, pt requires Min-mod A for standing and short distance gait training with use of RW for support. Pt with LOB towards left, visual deficits and narrow boS impacting mobility.  Pt requires Mod A at times to prevent falls and abruptly sitting without warning. Pt is not safe to be home alone. Would benefit from SNF to maximize independence and mobility prior to return home. Will follow acutely.     Recommendations for follow up therapy are one component of a multi-disciplinary discharge planning process, led by the attending physician.  Recommendations may be updated based on patient status, additional functional criteria and insurance authorization.  Follow Up Recommendations Skilled nursing-short term rehab (<3 hours/day) Can patient physically be transported by private vehicle: Yes    Assistance Recommended at Discharge Frequent or constant Supervision/Assistance  Patient can return home with the following  A little help with walking and/or transfers;A little help with bathing/dressing/bathroom;Assistance with cooking/housework;Direct supervision/assist for financial management;Assist for transportation;Help with stairs or ramp for entrance;Direct supervision/assist for medications management    Equipment Recommendations Rolling  walker (2 wheels)  Recommendations for Other Services       Functional Status Assessment Patient has had a recent decline in their functional status and demonstrates the ability to make significant improvements in function in a reasonable and predictable amount of time.     Precautions / Restrictions Precautions Precautions: Fall;Other (comment) Precaution Comments: visual deficits Restrictions Weight Bearing Restrictions: No      Mobility  Bed Mobility Overal bed mobility: Modified Independent             General bed mobility comments: dizziness with sitting eob and needs increased time    Transfers Overall transfer level: Needs assistance Equipment used: Rolling walker (2 wheels) Transfers: Sit to/from Stand Sit to Stand: Min assist           General transfer comment: pt needs increased time and required 2 attempts to achieve static standing. pt once standing dizzy and needs increase time, stood from EOB x1, from chair x1.    Ambulation/Gait Ambulation/Gait assistance: Min assist, +2 safety/equipment Gait Distance (Feet): 10 Feet (x2 bouts) Assistive device: Rolling walker (2 wheels) Gait Pattern/deviations: Step-through pattern, Decreased stride length, Decreased step length - right, Decreased step length - left, Narrow base of support, Staggering left Gait velocity: decreased Gait velocity interpretation: <1.31 ft/sec, indicative of household ambulator   General Gait Details: SLow, unsteady gait with narrow boS and leaning left, cues to keep eyes opened. LOB towards left when sitting in chair needing assist to lower.  Stairs            Wheelchair Mobility    Modified Rankin (Stroke Patients Only) Modified Rankin (Stroke Patients Only) Pre-Morbid Rankin Score: Slight disability Modified Rankin: Moderately severe disability     Balance Overall balance assessment: Needs assistance Sitting-balance support: Bilateral upper extremity supported, Feet  supported Sitting balance-Leahy Scale:  Fair Sitting balance - Comments: requires bil Hands   Standing balance support: Bilateral upper extremity supported, During functional activity, Reliant on assistive device for balance Standing balance-Leahy Scale: Poor Standing balance comment: Leans left with narrow BoS, needs UE support and external support                             Pertinent Vitals/Pain Pain Assessment Pain Assessment: 0-10 Pain Score: 10-Worst pain ever Pain Location: headache Pain Descriptors / Indicators: Headache Pain Intervention(s): Monitored during session, Repositioned    Home Living Family/patient expects to be discharged to:: Private residence Living Arrangements: Alone Available Help at Discharge: Family;Available PRN/intermittently Type of Home: Apartment Home Access: Stairs to enter Entrance Stairs-Rails: None Entrance Stairs-Number of Steps: 3   Home Layout: One level Home Equipment: None      Prior Function Prior Level of Function : Independent/Modified Independent             Mobility Comments: Independent, does not drive. Mom assists with driving ADLs Comments: independent     Hand Dominance   Dominant Hand: Right    Extremity/Trunk Assessment   Upper Extremity Assessment Upper Extremity Assessment: Defer to OT evaluation;LUE deficits/detail LUE Deficits / Details: drift with shoulder flexion. pt reports "its not as good as my other one" LUE Coordination: WNL    Lower Extremity Assessment Lower Extremity Assessment: LLE deficits/detail LLE Deficits / Details: weak but functional LLE Sensation: decreased light touch;decreased proprioception LLE Coordination: decreased fine motor;decreased gross motor    Cervical / Trunk Assessment Cervical / Trunk Assessment: Normal  Communication   Communication: No difficulties  Cognition Arousal/Alertness: Awake/alert Behavior During Therapy: WFL for tasks  assessed/performed Overall Cognitive Status: Impaired/Different from baseline Area of Impairment: Orientation, Attention, Memory, Following commands, Safety/judgement, Awareness, Problem solving                 Orientation Level: Disoriented to, Time Current Attention Level: Sustained Memory: Decreased recall of precautions, Decreased short-term memory Following Commands: Follows one step commands inconsistently, Follows one step commands with increased time Safety/Judgement: Decreased awareness of safety, Decreased awareness of deficits Awareness: Intellectual Problem Solving: Slow processing General Comments: OT moving to the other side of the room and pt could hear the therapist and due to visual deficits said "who is that?" Pt reports I can walk but pt attempting to express fear of falling and dizziness. pt encouraged and able to progress.        General Comments General comments (skin integrity, edema, etc.): OT taped glasses prior to session.    Exercises     Assessment/Plan    PT Assessment Patient needs continued PT services  PT Problem List Decreased strength;Decreased mobility;Decreased safety awareness;Pain;Decreased balance;Decreased knowledge of use of DME;Decreased activity tolerance;Decreased cognition;Decreased range of motion       PT Treatment Interventions Therapeutic exercise;Gait training;Patient/family education;Therapeutic activities;Functional mobility training;Cognitive remediation;Neuromuscular re-education;Stair training;Balance training;DME instruction    PT Goals (Current goals can be found in the Care Plan section)  Acute Rehab PT Goals Patient Stated Goal: to get better, decrease headache PT Goal Formulation: With patient Time For Goal Achievement: 11/24/22 Potential to Achieve Goals: Fair    Frequency Min 3X/week     Co-evaluation PT/OT/SLP Co-Evaluation/Treatment: Yes Reason for Co-Treatment: Necessary to address cognition/behavior  during functional activity;For patient/therapist safety;To address functional/ADL transfers PT goals addressed during session: Mobility/safety with mobility;Balance;Strengthening/ROM OT goals addressed during session: ADL's and self-care;Proper use of Adaptive equipment and  DME;Strengthening/ROM       AM-PAC PT "6 Clicks" Mobility  Outcome Measure Help needed turning from your back to your side while in a flat bed without using bedrails?: None Help needed moving from lying on your back to sitting on the side of a flat bed without using bedrails?: None Help needed moving to and from a bed to a chair (including a wheelchair)?: A Little Help needed standing up from a chair using your arms (e.g., wheelchair or bedside chair)?: A Little Help needed to walk in hospital room?: Total Help needed climbing 3-5 steps with a railing? : Total 6 Click Score: 16    End of Session Equipment Utilized During Treatment: Gait belt Activity Tolerance: Patient limited by pain Patient left: in bed;with call bell/phone within reach;with bed alarm set Nurse Communication: Mobility status PT Visit Diagnosis: Pain;Unsteadiness on feet (R26.81);Muscle weakness (generalized) (M62.81);Difficulty in walking, not elsewhere classified (R26.2) Pain - part of body:  (head)    Time: 3845-3646 PT Time Calculation (min) (ACUTE ONLY): 26 min   Charges:   PT Evaluation $PT Eval Moderate Complexity: 1 Mod          Marisa Severin, PT, DPT Acute Rehabilitation Services Secure chat preferred Office [phone]     Marguarite Arbour A [place] 11/10/2022, 9:34 AM

## 2022-11-10 NOTE — Progress Notes (Signed)
[facility] PROGRESS NOTE   Theresa Watkins RKY:[id number] DOB: 1966/05/10 DOA: 11/08/2022  PCP: Ladell Pier, MD  Brief History/Interval Summary: 56 y.o. female with medical history significant for prior stroke in 2017, ongoing cocaine abuse, ongoing smoking, HTN and HLD not regularly taking her meds, former EtOH abuse though not any more per pt. patient presented with lightheadedness generalized weakness especially weakness in the left arm and leg.  Workup revealed acute stroke.  She was hospitalized for further management.  Consultants: Neurology  Procedures: Echocardiogram is pending    Subjective/Interval History: Patient mentions that she still feels weak on the left side.  Denies any chest pain nausea vomiting.    Assessment/Plan:  Acute ischemic stroke Patient with left-sided weakness. Patient underwent MRI brain, MRA head and neck. Neurology is following. Patient currently on aspirin and Plavix. LDL noted to be 152.  Patient started on rosuvastatin. HbA1c is pending. PT OT has seen and SNF is recommended for rehabilitation. Echocardiogram is pending.  Cocaine abuse Patient counseled.  Essential hypertension Permissive hypertension being allowed.  Amlodipine on hold.  Hyperlipidemia LDL 152 Was on pravastatin but likely noncompliant.  Changed over to rosuvastatin.  Hypothyroidism Continue levothyroxine.  History of alcohol abuse This is apparently a previous history.  Not drinking any importer as per patient.  Tobacco abuse Counseled.   DVT Prophylaxis: Lovenox Code Status: Full code Family Communication: Discussed with patient Disposition Plan: SNF  Status is: Observation The patient will require care spanning > 2 midnights and should be moved to inpatient because: Acute stroke      Medications: Scheduled:   stroke: early stages of recovery book   Does not apply Once   aspirin EC  81 mg Oral Daily   clopidogrel  75 mg Oral  Daily   enoxaparin (LOVENOX) injection  40 mg Subcutaneous Q24H   levothyroxine  125 mcg Oral QAC breakfast   rosuvastatin  40 mg Oral Daily   Continuous: [id number] **OR** acetaminophen (TYLENOL) oral liquid 160 mg/5 mL **OR** acetaminophen, LORazepam, LORazepam  Antibiotics: Anti-infectives (From admission, onward)    None       Objective:  Vital Signs  Vitals:   11/10/22 0200 11/10/22 0235 11/10/22 0418 11/10/22 0751  BP: (!) 151/79 (!) 142/93 (!) 142/94 (!) 142/77  Pulse: 64 71 (!) 58 65  Resp: '18 18 18 17  '$ Temp:  97.9 F (36.6 C) 98.4 F (36.9 C) 99 F (37.2 C)  TempSrc:   Oral Oral  SpO2: 97% 100% 97% 97%  Weight:      Height:        Intake/Output Summary (Last 24 hours) at 11/10/2022 0954 Last data filed at 11/10/2022 0654 Gross per 24 hour  Intake --  Output 300 ml  Net -300 ml   Filed Weights   11/08/22 1825  Weight: 72.6 kg    General appearance: Awake alert.  In no distress Resp: Clear to auscultation bilaterally.  Normal effort Cardio: S1-S2 is normal regular.  No S3-S4.  No rubs murmurs or bruit GI: Abdomen is soft.  Nontender nondistended.  Bowel sounds are present normal.  No masses organomegaly Extremities: No edema.   Neurologic: Alert and oriented x3.  Left-sided weakness noted   Lab Results:  Data Reviewed: I have personally reviewed following labs and reports of the imaging studies  CBC: Recent Labs  Lab 11/08/22 1849  WBC 5.6  NEUTROABS 3.4  HGB 16.2*  HCT 49.1*  MCV 86.0  PLT 196  Basic Metabolic Panel: Recent Labs  Lab 11/08/22 1849 11/09/22 1718  NA 142  --   K 3.8  --   CL 105  --   CO2 25  --   GLUCOSE 102*  --   BUN 11  --   CREATININE 0.90  --   CALCIUM 8.5*  --   MG  --  1.9    GFR: Estimated Creatinine Clearance: 68.8 mL/min (by C-G formula based on SCr of 0.9 mg/dL).  Liver Function Tests: Recent Labs  Lab 11/08/22 1849  AST 15  ALT 14  ALKPHOS 55  BILITOT 0.8  PROT 6.7  ALBUMIN  4.1     Lipid Profile: Recent Labs    11/10/22 0321  CHOL 223*  HDL 51  LDLCALC 152*  TRIG 100  CHOLHDL 4.4    Radiology Studies: MR BRAIN WO CONTRAST  Result Date: 11/09/2022 CLINICAL DATA:  Initial evaluation for neuro deficit, stroke suspected. EXAM: MRI HEAD WITHOUT CONTRAST MRA HEAD WITHOUT CONTRAST MRA NECK WITHOUT CONTRAST TECHNIQUE: Multiplanar, multiecho pulse sequences of the brain and surrounding structures were obtained without intravenous contrast. Angiographic images of the Circle of Willis were obtained using MRA technique without intravenous contrast. Angiographic images of the neck were obtained using MRA technique without intravenous contrast. Carotid stenosis measurements (when applicable) are obtained utilizing NASCET criteria, using the distal internal carotid diameter as the denominator. COMPARISON:  Prior CT from earlier the same day as well as previous MRI from 09/05/2020. FINDINGS: MRI HEAD FINDINGS Brain: Cerebral volume stable, and remains within normal limits. Scattered patchy T2/FLAIR hyperintensity involving the periventricular and deep white matter both cerebral hemispheres as well as the pons, presumably related to chronic microvascular ischemic disease. Changes are mildly progressed as compared to previous MRI. Superimposed remote lacunar infarcts noted about the corpus callosum and left basal ganglia. A new chronic lacunar infarct noted at the anterior right basal ganglia, extending towards the operculum (series 7, image 20). 1.4 cm focus of restricted diffusion involving the para median right dorsal pons, consistent with an acute ischemic nonhemorrhagic infarct (series 2, image 15). Additional tiny subcentimeter acute ischemic nonhemorrhagic cortical infarct noted at the left frontal lobe as well (series 2, image 34). No other evidence for acute or subacute ischemia. No acute intracranial hemorrhage. Few scattered chronic micro hemorrhages noted, most pronounced  about the thalami, most characteristic of poorly controlled hypertension. No mass lesion, mass effect or midline shift. No hydrocephalus or extra-axial fluid collection. Pituitary gland and suprasellar region within normal limits. Vascular: Major intracranial vascular flow voids are maintained. Skull and upper cervical spine: Craniocervical junction with normal limits. Bone marrow signal intensity normal. No scalp soft tissue abnormality. Sinuses/Orbits: Globes and orbital soft tissues within normal limits. Paranasal sinuses are largely clear. No mastoid effusion. Other: None. MRA HEAD FINDINGS ANTERIOR CIRCULATION: Visualized distal cervical segments of both internal carotid arteries are widely patent with antegrade flow. Petrous, cavernous, and supraclinoid segments patent without stenosis. A1 segments patent bilaterally. Normal anterior communicating artery complex. Focal severe stenosis at the left A2/A3 junction again seen, similar to prior (series 4, image 142). An additional moderate to severe stenosis noted at the contralateral right A2/A3 junction as well (series 4, image 152), progressed from prior. Right M1 segment remains widely patent. Previously seen severe left M1 stenosis appears improved on today's exam, with no more than mild stenosis now seen at this level (series 4, image 115). No proximal MCA branch occlusion or high-grade stenosis. Distal small vessel atheromatous irregularity  again noted. POSTERIOR CIRCULATION: Both vertebral arteries patent without stenosis. Right vertebral artery dominant. Left PICA patent. Right PICA not well seen. Basilar patent without stenosis. Superior cerebral arteries patent bilaterally. PCAs widely patent to their distal aspects without significant stenosis. Small bilateral posterior communicating arteries noted. No intracranial aneurysm. MRA NECK FINDINGS AORTIC ARCH: Visualized aortic arch normal caliber with standard branch pattern. No visible stenosis about the  origin of the great vessels. RIGHT CAROTID SYSTEM: Right common and internal carotid arteries are patent without evidence for dissection. No significant atheromatous irregularity or stenosis about the right carotid bulb. LEFT CAROTID SYSTEM: Left common and internal carotid arteries remain patent without dissection. Mild eccentric plaque within the mid-distal left CCA without hemodynamically significant greater than 50% stenosis. No significant atheromatous narrowing or irregularity about the left carotid bulb itself. VERTEBRAL ARTERIES: Left vertebral artery arises directly from the aortic arch. Right vertebral artery slightly dominant. Vertebral arteries patent without evidence for dissection or occlusion. IMPRESSION: MRI HEAD IMPRESSION: 1. 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 2. Additional subcentimeter acute ischemic nonhemorrhagic cortical infarct involving the left frontal lobe. 3. Underlying chronic microvascular ischemic disease with multiple additional remote lacunar infarcts as above. Overall, appearance is mildly progressed as compared to previous MRI from 09/05/2020. 4. Few scattered chronic micro hemorrhages, most characteristic of poorly controlled hypertension. MRA HEAD IMPRESSION: 1. Negative intracranial MRA for large vessel occlusion. 2. Severe left A2/A3 stenosis, similar compared to previous MRA from 09/05/2020. 3. Moderate to severe contralateral right A2/A3 stenosis, progressed from prior. 4. Interval improvement in previously seen severe left M1 stenosis, now no more than mild in nature. 5. Distal small vessel atheromatous irregularity. MRA NECK IMPRESSION: Continued wide patency of both carotid artery systems and vertebral arteries within the neck. No hemodynamically significant stenosis or other acute vascular abnormality. Electronically Signed   By: Jeannine Boga M.D.   On: 11/09/2022 22:50   MR ANGIO HEAD WO CONTRAST  Result Date:  11/09/2022 CLINICAL DATA:  Initial evaluation for neuro deficit, stroke suspected. EXAM: MRI HEAD WITHOUT CONTRAST MRA HEAD WITHOUT CONTRAST MRA NECK WITHOUT CONTRAST TECHNIQUE: Multiplanar, multiecho pulse sequences of the brain and surrounding structures were obtained without intravenous contrast. Angiographic images of the Circle of Willis were obtained using MRA technique without intravenous contrast. Angiographic images of the neck were obtained using MRA technique without intravenous contrast. Carotid stenosis measurements (when applicable) are obtained utilizing NASCET criteria, using the distal internal carotid diameter as the denominator. COMPARISON:  Prior CT from earlier the same day as well as previous MRI from 09/05/2020. FINDINGS: MRI HEAD FINDINGS Brain: Cerebral volume stable, and remains within normal limits. Scattered patchy T2/FLAIR hyperintensity involving the periventricular and deep white matter both cerebral hemispheres as well as the pons, presumably related to chronic microvascular ischemic disease. Changes are mildly progressed as compared to previous MRI. Superimposed remote lacunar infarcts noted about the corpus callosum and left basal ganglia. A new chronic lacunar infarct noted at the anterior right basal ganglia, extending towards the operculum (series 7, image 20). 1.4 cm focus of restricted diffusion involving the para median right dorsal pons, consistent with an acute ischemic nonhemorrhagic infarct (series 2, image 15). Additional tiny subcentimeter acute ischemic nonhemorrhagic cortical infarct noted at the left frontal lobe as well (series 2, image 34). No other evidence for acute or subacute ischemia. No acute intracranial hemorrhage. Few scattered chronic micro hemorrhages noted, most pronounced about the thalami, most characteristic of poorly controlled hypertension. No mass lesion, mass  effect or midline shift. No hydrocephalus or extra-axial fluid collection. Pituitary  gland and suprasellar region within normal limits. Vascular: Major intracranial vascular flow voids are maintained. Skull and upper cervical spine: Craniocervical junction with normal limits. Bone marrow signal intensity normal. No scalp soft tissue abnormality. Sinuses/Orbits: Globes and orbital soft tissues within normal limits. Paranasal sinuses are largely clear. No mastoid effusion. Other: None. MRA HEAD FINDINGS ANTERIOR CIRCULATION: Visualized distal cervical segments of both internal carotid arteries are widely patent with antegrade flow. Petrous, cavernous, and supraclinoid segments patent without stenosis. A1 segments patent bilaterally. Normal anterior communicating artery complex. Focal severe stenosis at the left A2/A3 junction again seen, similar to prior (series 4, image 142). An additional moderate to severe stenosis noted at the contralateral right A2/A3 junction as well (series 4, image 152), progressed from prior. Right M1 segment remains widely patent. Previously seen severe left M1 stenosis appears improved on today's exam, with no more than mild stenosis now seen at this level (series 4, image 115). No proximal MCA branch occlusion or high-grade stenosis. Distal small vessel atheromatous irregularity again noted. POSTERIOR CIRCULATION: Both vertebral arteries patent without stenosis. Right vertebral artery dominant. Left PICA patent. Right PICA not well seen. Basilar patent without stenosis. Superior cerebral arteries patent bilaterally. PCAs widely patent to their distal aspects without significant stenosis. Small bilateral posterior communicating arteries noted. No intracranial aneurysm. MRA NECK FINDINGS AORTIC ARCH: Visualized aortic arch normal caliber with standard branch pattern. No visible stenosis about the origin of the great vessels. RIGHT CAROTID SYSTEM: Right common and internal carotid arteries are patent without evidence for dissection. No significant atheromatous irregularity or  stenosis about the right carotid bulb. LEFT CAROTID SYSTEM: Left common and internal carotid arteries remain patent without dissection. Mild eccentric plaque within the mid-distal left CCA without hemodynamically significant greater than 50% stenosis. No significant atheromatous narrowing or irregularity about the left carotid bulb itself. VERTEBRAL ARTERIES: Left vertebral artery arises directly from the aortic arch. Right vertebral artery slightly dominant. Vertebral arteries patent without evidence for dissection or occlusion. IMPRESSION: MRI HEAD IMPRESSION: 1. 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 2. Additional subcentimeter acute ischemic nonhemorrhagic cortical infarct involving the left frontal lobe. 3. Underlying chronic microvascular ischemic disease with multiple additional remote lacunar infarcts as above. Overall, appearance is mildly progressed as compared to previous MRI from 09/05/2020. 4. Few scattered chronic micro hemorrhages, most characteristic of poorly controlled hypertension. MRA HEAD IMPRESSION: 1. Negative intracranial MRA for large vessel occlusion. 2. Severe left A2/A3 stenosis, similar compared to previous MRA from 09/05/2020. 3. Moderate to severe contralateral right A2/A3 stenosis, progressed from prior. 4. Interval improvement in previously seen severe left M1 stenosis, now no more than mild in nature. 5. Distal small vessel atheromatous irregularity. MRA NECK IMPRESSION: Continued wide patency of both carotid artery systems and vertebral arteries within the neck. No hemodynamically significant stenosis or other acute vascular abnormality. Electronically Signed   By: Jeannine Boga M.D.   On: 11/09/2022 22:50   MR ANGIO NECK WO CONTRAST  Result Date: 11/09/2022 CLINICAL DATA:  Initial evaluation for neuro deficit, stroke suspected. EXAM: MRI HEAD WITHOUT CONTRAST MRA HEAD WITHOUT CONTRAST MRA NECK WITHOUT CONTRAST TECHNIQUE: Multiplanar,  multiecho pulse sequences of the brain and surrounding structures were obtained without intravenous contrast. Angiographic images of the Circle of Willis were obtained using MRA technique without intravenous contrast. Angiographic images of the neck were obtained using MRA technique without intravenous contrast. Carotid stenosis measurements (when applicable) are  obtained utilizing NASCET criteria, using the distal internal carotid diameter as the denominator. COMPARISON:  Prior CT from earlier the same day as well as previous MRI from 09/05/2020. FINDINGS: MRI HEAD FINDINGS Brain: Cerebral volume stable, and remains within normal limits. Scattered patchy T2/FLAIR hyperintensity involving the periventricular and deep white matter both cerebral hemispheres as well as the pons, presumably related to chronic microvascular ischemic disease. Changes are mildly progressed as compared to previous MRI. Superimposed remote lacunar infarcts noted about the corpus callosum and left basal ganglia. A new chronic lacunar infarct noted at the anterior right basal ganglia, extending towards the operculum (series 7, image 20). 1.4 cm focus of restricted diffusion involving the para median right dorsal pons, consistent with an acute ischemic nonhemorrhagic infarct (series 2, image 15). Additional tiny subcentimeter acute ischemic nonhemorrhagic cortical infarct noted at the left frontal lobe as well (series 2, image 34). No other evidence for acute or subacute ischemia. No acute intracranial hemorrhage. Few scattered chronic micro hemorrhages noted, most pronounced about the thalami, most characteristic of poorly controlled hypertension. No mass lesion, mass effect or midline shift. No hydrocephalus or extra-axial fluid collection. Pituitary gland and suprasellar region within normal limits. Vascular: Major intracranial vascular flow voids are maintained. Skull and upper cervical spine: Craniocervical junction with normal limits.  Bone marrow signal intensity normal. No scalp soft tissue abnormality. Sinuses/Orbits: Globes and orbital soft tissues within normal limits. Paranasal sinuses are largely clear. No mastoid effusion. Other: None. MRA HEAD FINDINGS ANTERIOR CIRCULATION: Visualized distal cervical segments of both internal carotid arteries are widely patent with antegrade flow. Petrous, cavernous, and supraclinoid segments patent without stenosis. A1 segments patent bilaterally. Normal anterior communicating artery complex. Focal severe stenosis at the left A2/A3 junction again seen, similar to prior (series 4, image 142). An additional moderate to severe stenosis noted at the contralateral right A2/A3 junction as well (series 4, image 152), progressed from prior. Right M1 segment remains widely patent. Previously seen severe left M1 stenosis appears improved on today's exam, with no more than mild stenosis now seen at this level (series 4, image 115). No proximal MCA branch occlusion or high-grade stenosis. Distal small vessel atheromatous irregularity again noted. POSTERIOR CIRCULATION: Both vertebral arteries patent without stenosis. Right vertebral artery dominant. Left PICA patent. Right PICA not well seen. Basilar patent without stenosis. Superior cerebral arteries patent bilaterally. PCAs widely patent to their distal aspects without significant stenosis. Small bilateral posterior communicating arteries noted. No intracranial aneurysm. MRA NECK FINDINGS AORTIC ARCH: Visualized aortic arch normal caliber with standard branch pattern. No visible stenosis about the origin of the great vessels. RIGHT CAROTID SYSTEM: Right common and internal carotid arteries are patent without evidence for dissection. No significant atheromatous irregularity or stenosis about the right carotid bulb. LEFT CAROTID SYSTEM: Left common and internal carotid arteries remain patent without dissection. Mild eccentric plaque within the mid-distal left CCA  without hemodynamically significant greater than 50% stenosis. No significant atheromatous narrowing or irregularity about the left carotid bulb itself. VERTEBRAL ARTERIES: Left vertebral artery arises directly from the aortic arch. Right vertebral artery slightly dominant. Vertebral arteries patent without evidence for dissection or occlusion. IMPRESSION: MRI HEAD IMPRESSION: 1. 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 2. Additional subcentimeter acute ischemic nonhemorrhagic cortical infarct involving the left frontal lobe. 3. Underlying chronic microvascular ischemic disease with multiple additional remote lacunar infarcts as above. Overall, appearance is mildly progressed as compared to previous MRI from 09/05/2020. 4. Few scattered chronic micro hemorrhages, most  characteristic of poorly controlled hypertension. MRA HEAD IMPRESSION: 1. Negative intracranial MRA for large vessel occlusion. 2. Severe left A2/A3 stenosis, similar compared to previous MRA from 09/05/2020. 3. Moderate to severe contralateral right A2/A3 stenosis, progressed from prior. 4. Interval improvement in previously seen severe left M1 stenosis, now no more than mild in nature. 5. Distal small vessel atheromatous irregularity. MRA NECK IMPRESSION: Continued wide patency of both carotid artery systems and vertebral arteries within the neck. No hemodynamically significant stenosis or other acute vascular abnormality. Electronically Signed   By: Jeannine Boga M.D.   On: 11/09/2022 22:50   CT HEAD WO CONTRAST (5MM)  Result Date: 11/09/2022 CLINICAL DATA:  Neuro deficit, acute stroke suspected EXAM: CT HEAD WITHOUT CONTRAST TECHNIQUE: Contiguous axial images were obtained from the base of the skull through the vertex without intravenous contrast. RADIATION DOSE REDUCTION: This exam was performed according to the departmental dose-optimization program which includes automated exposure control, adjustment of  the mA and/or kV according to patient size and/or use of iterative reconstruction technique. COMPARISON:  CT brain, 09/05/2020 FINDINGS: Brain: New, although age indeterminate and subacute to chronic appearing infarctions of the right anterior limb of the internal capsule and caudate head as well as the adjacent corona radiata (series 3, image 14). Unchanged chronic left basal ganglia and corona radiata infarctions (series 3, image 18). No hemorrhage, hydrocephalus, extra-axial collection or mass lesion/mass effect. Vascular: No hyperdense vessel or unexpected calcification. Skull: Normal. Negative for fracture or focal lesion. Sinuses/Orbits: No acute finding. Other: None. IMPRESSION: 1. New, although age indeterminate and subacute to chronic appearing infarctions of the right anterior limb of the internal capsule and caudate head as well as the adjacent corona radiata. Consider MRI to evaluate for acutely diffusion restricting infarction if suspected. 2. Unchanged chronic left basal ganglia and corona radiata infarctions. 3. No acute intracranial hemorrhage. Electronically Signed   By: Delanna Ahmadi M.D.   On: 11/09/2022 19:09   DG Chest Portable 1 View  Result Date: 11/09/2022 CLINICAL DATA:  Presyncope, dyspnea EXAM: PORTABLE CHEST 1 VIEW COMPARISON:  05/25/2018 FINDINGS: The heart size and mediastinal contours are within normal limits. Both lungs are clear. The visualized skeletal structures are unremarkable. Surgical clips again noted at the neck base bilaterally. IMPRESSION: No active disease. Electronically Signed   By: Fidela Salisbury M.D.   On: 11/09/2022 17:47       LOS: 0 days   [facility] Pager on [email or web address]  11/10/2022, 9:54 AM

## 2022-11-10 NOTE — Evaluation (Signed)
Occupational Therapy Evaluation Patient Details Name: Theresa Watkins MRN: [id number] DOB: August 04, 1966 Today's Date: 11/10/2022   History of Present Illness Patient is a 56 y/o female who presents on 12/22 with dizziness, left sided weakness/numbness and diploplia.  + cocaine. 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 PMH includes CVA, HTN, thyroid cancer, cocaine use, seizures, UTI.   Clinical Impression   PT admitted with CVA with INO visual changes noted. Pt currently with functional limitiations due to the deficits listed below (see OT problem list). Pt lives alone in an apartment and indep for all adls. Pt currently with cognitive deficits and new visual deficits affecting all adls. Pt requires two person (A) for safety and balance during transfers. Pt with poor return demo of compensatory visual strategies.  Pt will benefit from skilled OT to increase their independence and safety with adls and balance to allow discharge SNF .       Recommendations for follow up therapy are one component of a multi-disciplinary discharge planning process, led by the attending physician.  Recommendations may be updated based on patient status, additional functional criteria and insurance authorization.   Follow Up Recommendations  Skilled nursing-short term rehab (<3 hours/day)     Assistance Recommended at Discharge Intermittent Supervision/Assistance  Patient can return home with the following Two people to help with walking and/or transfers;A lot of help with bathing/dressing/bathroom    Functional Status Assessment  Patient has had a recent decline in their functional status and demonstrates the ability to make significant improvements in function in a reasonable and predictable amount of time.  Equipment Recommendations  None recommended by OT    Recommendations for Other Services       Precautions / Restrictions Precautions Precautions: Fall Restrictions Weight Bearing  Restrictions: No      Mobility Bed Mobility Overal bed mobility: Modified Independent             General bed mobility comments: dizziness with sitting eob and needs increased time    Transfers Overall transfer level: Needs assistance Equipment used: Rolling walker (2 wheels) Transfers: Sit to/from Stand Sit to Stand: Min assist           General transfer comment: pt needs increased time and required 2 attempts to achieve static standing. pt once standing dizzy and needs increase time      Balance Overall balance assessment: Needs assistance Sitting-balance support: Bilateral upper extremity supported, Feet supported Sitting balance-Leahy Scale: Fair Sitting balance - Comments: requires bil Hands   Standing balance support: Bilateral upper extremity supported, During functional activity, Reliant on assistive device for balance Standing balance-Leahy Scale: Poor                             ADL either performed or assessed with clinical judgement   ADL Overall ADL's : Needs assistance/impaired Eating/Feeding: Minimal assistance;Bed level   Grooming: Wash/dry hands;Wash/dry face;Oral care;Minimal assistance;Sitting Grooming Details (indicate cue type and reason): pt with L lean with static standing Upper Body Bathing: Minimal assistance   Lower Body Bathing: Minimal assistance Lower Body Bathing Details (indicate cue type and reason): don socks supine         Toilet Transfer: +2 for physical assistance;Minimal assistance;Rolling walker (2 wheels)           Functional mobility during ADLs: +2 for physical assistance;Minimal assistance General ADL Comments: pt with L side lean and abrutly sitting     Vision  Baseline Vision/History: 0 No visual deficits Ability to See in Adequate Light: 3 Highly impaired Patient Visual Report: Diplopia;Blurring of vision;Peripheral vision impairment;Central vision impairment Vision Assessment?: Yes Eye  Alignment: Impaired (comment) Ocular Range of Motion: Restricted on the right;Impaired-to be further tested in functional context Alignment/Gaze Preference: Head tilt;Head turned Tracking/Visual Pursuits: Impaired - to be further tested in functional context Saccades: Impaired - to be further tested in functional context Convergence: Impaired - to be further tested in functional context Visual Fields: Impaired-to be further tested in functional context;Other (comment) (Bil INO noted) Diplopia Assessment: Present all the time/all directions Depth Perception: Undershoots Additional Comments: pt noted to have bil INO deficits. pt with L eye with better visual acuity. pt closing bil eyes throughout session. pt noted to have some rotational nystagmus as this time. pt using glasses with movement but in bed not using appropriately. pt unaware of oatmeal on her face. pt reports glasses help diplopia but then raise the glasses to look at a distance to the sink surface from chair to know the direction she needs to mobilize     Perception     Praxis      Pertinent Vitals/Pain Pain Assessment Pain Assessment: 0-10 Pain Score: 10-Worst pain ever Pain Location: headache Pain Descriptors / Indicators: Headache     Hand Dominance Right   Extremity/Trunk Assessment Upper Extremity Assessment Upper Extremity Assessment: LUE deficits/detail LUE Deficits / Details: drift with shoulder flexion. pt reports "its not as good as my other one" LUE Coordination: WNL   Lower Extremity Assessment Lower Extremity Assessment: Defer to PT evaluation LLE Deficits / Details: weak but functional LLE Sensation: decreased light touch;decreased proprioception LLE Coordination: decreased fine motor;decreased gross motor   Cervical / Trunk Assessment Cervical / Trunk Assessment: Normal   Communication Communication Communication: No difficulties   Cognition Arousal/Alertness: Awake/alert Behavior During Therapy:  WFL for tasks assessed/performed Overall Cognitive Status: Impaired/Different from baseline Area of Impairment: Orientation, Attention, Memory, Following commands, Safety/judgement, Awareness, Problem solving                 Orientation Level: Disoriented to, Place, Time Current Attention Level: Sustained Memory: Decreased recall of precautions, Decreased short-term memory Following Commands: Follows one step commands inconsistently, Follows one step commands with increased time Safety/Judgement: Decreased awareness of safety, Decreased awareness of deficits Awareness: Intellectual Problem Solving: Slow processing General Comments: OT moving to the other side of the room and pt could hear the therapist and due to visual deficits said "who is that?" Pt reports I can walk but pt attempting to express fear of falling and dizziness. pt encouraged and able to progress.     General Comments       Exercises     Shoulder Instructions      Home Living Family/patient expects to be discharged to:: Private residence Living Arrangements: Alone Available Help at Discharge: Family;Available PRN/intermittently Type of Home: Apartment Home Access: Stairs to enter Entrance Stairs-Number of Steps: 3 Entrance Stairs-Rails: None Home Layout: One level     Bathroom Shower/Tub: Teacher, early years/pre: Standard     Home Equipment: None          Prior Functioning/Environment Prior Level of Function : Independent/Modified Independent             Mobility Comments: Independent, does not drive. Mom assists with driving ADLs Comments: independent        OT Problem List: Impaired balance (sitting and/or standing);Decreased cognition;Decreased activity tolerance;Decreased strength;Decreased safety awareness;Decreased  knowledge of use of DME or AE      OT Treatment/Interventions: Self-care/ADL training;Energy conservation;DME and/or AE instruction;Manual  therapy;Modalities;Therapeutic activities;Patient/family education;Balance training;Visual/perceptual remediation/compensation;Cognitive remediation/compensation;Therapeutic exercise    OT Goals(Current goals can be found in the care plan section) Acute Rehab OT Goals Patient Stated Goal: to go to sleep OT Goal Formulation: With patient Time For Goal Achievement: 11/24/22 Potential to Achieve Goals: Good  OT Frequency: Min 2X/week    Co-evaluation PT/OT/SLP Co-Evaluation/Treatment: Yes Reason for Co-Treatment: Necessary to address cognition/behavior during functional activity;For patient/therapist safety;To address functional/ADL transfers   OT goals addressed during session: ADL's and self-care;Proper use of Adaptive equipment and DME;Strengthening/ROM      AM-PAC OT "6 Clicks" Daily Activity     Outcome Measure Help from another person eating meals?: A Little Help from another person taking care of personal grooming?: A Little Help from another person toileting, which includes using toliet, bedpan, or urinal?: A Lot Help from another person bathing (including washing, rinsing, drying)?: A Lot Help from another person to put on and taking off regular upper body clothing?: A Little Help from another person to put on and taking off regular lower body clothing?: A Little 6 Click Score: 16   End of Session Equipment Utilized During Treatment: Gait belt;Rolling walker (2 wheels) Nurse Communication: Mobility status;Precautions  Activity Tolerance: Patient tolerated treatment well Patient left: in bed;with call bell/phone within reach;with bed alarm set  OT Visit Diagnosis: Unsteadiness on feet (R26.81);Muscle weakness (generalized) (M62.81)                Time: 0277-4128 OT Time Calculation (min): 22 min Charges:  OT General Charges $OT Visit: 1 Visit OT Evaluation $OT Eval Moderate Complexity: 1 Mod   Brynn, OTR/L  Acute Rehabilitation Services Office:  [phone] .   Jeri Modena 11/10/2022, 9:18 AM

## 2022-11-10 NOTE — Progress Notes (Signed)
STROKE TEAM PROGRESS NOTE   SUBJECTIVE (INTERVAL HISTORY) No family is at the bedside.  Pt lying in bed, complaining of double vision and blurry vision. She does have right INO.    OBJECTIVE Temp:  [97.9 F (36.6 C)-99 F (37.2 C)] 98.8 F (37.1 C) (12/24 1203) Pulse Rate:  [51-77] 64 (12/24 1203) Cardiac Rhythm: Normal sinus rhythm (12/24 0700) Resp:  [12-27] 17 (12/24 1203) BP: (125-160)/(71-100) 139/81 (12/24 1203) SpO2:  [93 %-100 %] 93 % (12/24 1203)  No results for input(s): "GLUCAP" in the last 168 hours. Recent Labs  Lab 11/08/22 1849 11/09/22 1718  NA 142  --   K 3.8  --   CL 105  --   CO2 25  --   GLUCOSE 102*  --   BUN 11  --   CREATININE 0.90  --   CALCIUM 8.5*  --   MG  --  1.9   Recent Labs  Lab 11/08/22 1849  AST 15  ALT 14  ALKPHOS 55  BILITOT 0.8  PROT 6.7  ALBUMIN 4.1   Recent Labs  Lab 11/08/22 1849  WBC 5.6  NEUTROABS 3.4  HGB 16.2*  HCT 49.1*  MCV 86.0  PLT 196   No results for input(s): "CKTOTAL", "CKMB", "CKMBINDEX", "TROPONINI" in the last 168 hours. No results for input(s): "LABPROT", "INR" in the last 72 hours. No results for input(s): "COLORURINE", "LABSPEC", "PHURINE", "GLUCOSEU", "HGBUR", "BILIRUBINUR", "KETONESUR", "PROTEINUR", "UROBILINOGEN", "NITRITE", "LEUKOCYTESUR" in the last 72 hours.  Invalid input(s): "APPERANCEUR"     Component Value Date/Time   CHOL 223 (H) 11/10/2022 0321   CHOL 235 (H) 09/24/2022 1126   TRIG 100 11/10/2022 0321   HDL 51 11/10/2022 0321   HDL 56 09/24/2022 1126   CHOLHDL 4.4 11/10/2022 0321   VLDL 20 11/10/2022 0321   LDLCALC 152 (H) 11/10/2022 0321   LDLCALC 165 (H) 09/24/2022 1126   Lab Results  Component Value Date   HGBA1C 5.9 (H) 09/25/2017      Component Value Date/Time   LABOPIA NONE DETECTED 11/08/2022 2000   COCAINSCRNUR POSITIVE (A) 11/08/2022 2000   LABBENZ NONE DETECTED 11/08/2022 2000   AMPHETMU NONE DETECTED 11/08/2022 2000   THCU NONE DETECTED 11/08/2022 2000    LABBARB NONE DETECTED 11/08/2022 2000    Recent Labs  Lab 11/08/22 1904  ETH <10    I have personally reviewed the radiological images below and agree with the radiology interpretations.  ECHOCARDIOGRAM COMPLETE  Result Date: 11/10/2022    ECHOCARDIOGRAM REPORT   Patient Name:   EILA RUNYAN Date of Exam: 11/10/2022 Medical Rec #:  [id number]         Height:       65.0 in Accession #:    [id number]        Weight:       160.0 lb Date of Birth:  [birth year]         BSA:          1.799 m Patient Age:    56 years          BP:           142/77 mmHg Patient Gender: F                 HR:           65 bpm. Exam Location:  Inpatient Procedure: 2D Echo, Cardiac Doppler and Color Doppler Indications:    I63.9 Stroke  History:  Patient has prior history of Echocardiogram examinations, most                 recent 12/25/2015. Risk Factors:Hypertension, Dyslipidemia and                 Current Smoker. ETOH and Cocaine abuse.  Sonographer:    Alvino Chapel RCS Referring Phys: [place]  1. Left ventricular ejection fraction, by estimation, is 60 to 65%. The left ventricle has normal function. The left ventricle has no regional wall motion abnormalities. There is moderate left ventricular hypertrophy. Left ventricular diastolic parameters are indeterminate.  2. Right ventricular systolic function is normal. The right ventricular size is normal. There is normal pulmonary artery systolic pressure.  3. Mobile atrial septal aneursym no obvious PFO/shunt.  4. The mitral valve is abnormal. Trivial mitral valve regurgitation. No evidence of mitral stenosis.  5. The aortic valve is tricuspid. Aortic valve regurgitation is not visualized. No aortic stenosis is present.  6. The inferior vena cava is normal in size with greater than 50% respiratory variability, suggesting right atrial pressure of 3 mmHg. FINDINGS  Left Ventricle: Left ventricular ejection fraction, by estimation, is 60 to 65%. The  left ventricle has normal function. The left ventricle has no regional wall motion abnormalities. The left ventricular internal cavity size was normal in size. There is  moderate left ventricular hypertrophy. Left ventricular diastolic parameters are indeterminate. Right Ventricle: The right ventricular size is normal. No increase in right ventricular wall thickness. Right ventricular systolic function is normal. There is normal pulmonary artery systolic pressure. The tricuspid regurgitant velocity is 1.88 m/s, and  with an assumed right atrial pressure of 3 mmHg, the estimated right ventricular systolic pressure is 66.5 mmHg. Left Atrium: Left atrial size was normal in size. Right Atrium: Right atrial size was normal in size. Pericardium: There is no evidence of pericardial effusion. Mitral Valve: The mitral valve is abnormal. There is mild thickening of the mitral valve leaflet(s). Trivial mitral valve regurgitation. No evidence of mitral valve stenosis. Tricuspid Valve: The tricuspid valve is normal in structure. Tricuspid valve regurgitation is trivial. No evidence of tricuspid stenosis. Aortic Valve: The aortic valve is tricuspid. Aortic valve regurgitation is not visualized. No aortic stenosis is present. Pulmonic Valve: The pulmonic valve was normal in structure. Pulmonic valve regurgitation is not visualized. No evidence of pulmonic stenosis. Aorta: The aortic root is normal in size and structure. Venous: The inferior vena cava is normal in size with greater than 50% respiratory variability, suggesting right atrial pressure of 3 mmHg. IAS/Shunts: No atrial level shunt detected by color flow Doppler.  LEFT VENTRICLE PLAX 2D LVIDd:         4.20 cm   Diastology LVIDs:         2.70 cm   LV e' medial:    5.22 cm/s LV PW:         1.40 cm   LV E/e' medial:  11.0 LV IVS:        1.30 cm   LV e' lateral:   6.96 cm/s LVOT diam:     2.10 cm   LV E/e' lateral: 8.2 LV SV:         64 LV SV Index:   35 LVOT Area:     3.46  cm  RIGHT VENTRICLE RV S prime:     12.70 cm/s TAPSE (M-mode): 1.7 cm LEFT ATRIUM  Index        RIGHT ATRIUM           Index LA diam:        3.40 cm 1.89 cm/m   RA Area:     14.10 cm LA Vol (A2C):   49.4 ml 27.46 ml/m  RA Volume:   35.20 ml  19.56 ml/m LA Vol (A4C):   43.5 ml 24.18 ml/m LA Biplane Vol: 49.3 ml 27.40 ml/m  AORTIC VALVE LVOT Vmax:   85.50 cm/s LVOT Vmean:  55.700 cm/s LVOT VTI:    0.184 m  AORTA Ao Root diam: 3.20 cm MITRAL VALVE               TRICUSPID VALVE MV Area (PHT): 2.62 cm    TR Peak grad:   14.1 mmHg MV Decel Time: 289 msec    TR Vmax:        188.00 cm/s MV E velocity: 57.20 cm/s MV A velocity: 70.30 cm/s  SHUNTS MV E/A ratio:  0.81        Systemic VTI:  0.18 m                            Systemic Diam: 2.10 cm Jenkins Rouge MD Electronically signed by Jenkins Rouge MD Signature Date/Time: 11/10/2022/11:08:52 AM    Final    MR BRAIN WO CONTRAST  Result Date: 11/09/2022 CLINICAL DATA:  Initial evaluation for neuro deficit, stroke suspected. EXAM: MRI HEAD WITHOUT CONTRAST MRA HEAD WITHOUT CONTRAST MRA NECK WITHOUT CONTRAST TECHNIQUE: Multiplanar, multiecho pulse sequences of the brain and surrounding structures were obtained without intravenous contrast. Angiographic images of the Circle of Willis were obtained using MRA technique without intravenous contrast. Angiographic images of the neck were obtained using MRA technique without intravenous contrast. Carotid stenosis measurements (when applicable) are obtained utilizing NASCET criteria, using the distal internal carotid diameter as the denominator. COMPARISON:  Prior CT from earlier the same day as well as previous MRI from 09/05/2020. FINDINGS: MRI HEAD FINDINGS Brain: Cerebral volume stable, and remains within normal limits. Scattered patchy T2/FLAIR hyperintensity involving the periventricular and deep white matter both cerebral hemispheres as well as the pons, presumably related to chronic microvascular ischemic  disease. Changes are mildly progressed as compared to previous MRI. Superimposed remote lacunar infarcts noted about the corpus callosum and left basal ganglia. A new chronic lacunar infarct noted at the anterior right basal ganglia, extending towards the operculum (series 7, image 20). 1.4 cm focus of restricted diffusion involving the para median right dorsal pons, consistent with an acute ischemic nonhemorrhagic infarct (series 2, image 15). Additional tiny subcentimeter acute ischemic nonhemorrhagic cortical infarct noted at the left frontal lobe as well (series 2, image 34). No other evidence for acute or subacute ischemia. No acute intracranial hemorrhage. Few scattered chronic micro hemorrhages noted, most pronounced about the thalami, most characteristic of poorly controlled hypertension. No mass lesion, mass effect or midline shift. No hydrocephalus or extra-axial fluid collection. Pituitary gland and suprasellar region within normal limits. Vascular: Major intracranial vascular flow voids are maintained. Skull and upper cervical spine: Craniocervical junction with normal limits. Bone marrow signal intensity normal. No scalp soft tissue abnormality. Sinuses/Orbits: Globes and orbital soft tissues within normal limits. Paranasal sinuses are largely clear. No mastoid effusion. Other: None. MRA HEAD FINDINGS ANTERIOR CIRCULATION: Visualized distal cervical segments of both internal carotid arteries are widely patent with antegrade flow. Petrous, cavernous, and supraclinoid segments patent without  stenosis. A1 segments patent bilaterally. Normal anterior communicating artery complex. Focal severe stenosis at the left A2/A3 junction again seen, similar to prior (series 4, image 142). An additional moderate to severe stenosis noted at the contralateral right A2/A3 junction as well (series 4, image 152), progressed from prior. Right M1 segment remains widely patent. Previously seen severe left M1 stenosis appears  improved on today's exam, with no more than mild stenosis now seen at this level (series 4, image 115). No proximal MCA branch occlusion or high-grade stenosis. Distal small vessel atheromatous irregularity again noted. POSTERIOR CIRCULATION: Both vertebral arteries patent without stenosis. Right vertebral artery dominant. Left PICA patent. Right PICA not well seen. Basilar patent without stenosis. Superior cerebral arteries patent bilaterally. PCAs widely patent to their distal aspects without significant stenosis. Small bilateral posterior communicating arteries noted. No intracranial aneurysm. MRA NECK FINDINGS AORTIC ARCH: Visualized aortic arch normal caliber with standard branch pattern. No visible stenosis about the origin of the great vessels. RIGHT CAROTID SYSTEM: Right common and internal carotid arteries are patent without evidence for dissection. No significant atheromatous irregularity or stenosis about the right carotid bulb. LEFT CAROTID SYSTEM: Left common and internal carotid arteries remain patent without dissection. Mild eccentric plaque within the mid-distal left CCA without hemodynamically significant greater than 50% stenosis. No significant atheromatous narrowing or irregularity about the left carotid bulb itself. VERTEBRAL ARTERIES: Left vertebral artery arises directly from the aortic arch. Right vertebral artery slightly dominant. Vertebral arteries patent without evidence for dissection or occlusion. IMPRESSION: MRI HEAD IMPRESSION: 1. 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 2. Additional subcentimeter acute ischemic nonhemorrhagic cortical infarct involving the left frontal lobe. 3. Underlying chronic microvascular ischemic disease with multiple additional remote lacunar infarcts as above. Overall, appearance is mildly progressed as compared to previous MRI from 09/05/2020. 4. Few scattered chronic micro hemorrhages, most characteristic of poorly controlled  hypertension. MRA HEAD IMPRESSION: 1. Negative intracranial MRA for large vessel occlusion. 2. Severe left A2/A3 stenosis, similar compared to previous MRA from 09/05/2020. 3. Moderate to severe contralateral right A2/A3 stenosis, progressed from prior. 4. Interval improvement in previously seen severe left M1 stenosis, now no more than mild in nature. 5. Distal small vessel atheromatous irregularity. MRA NECK IMPRESSION: Continued wide patency of both carotid artery systems and vertebral arteries within the neck. No hemodynamically significant stenosis or other acute vascular abnormality. Electronically Signed   By: Jeannine Boga M.D.   On: 11/09/2022 22:50   MR ANGIO HEAD WO CONTRAST  Result Date: 11/09/2022 CLINICAL DATA:  Initial evaluation for neuro deficit, stroke suspected. EXAM: MRI HEAD WITHOUT CONTRAST MRA HEAD WITHOUT CONTRAST MRA NECK WITHOUT CONTRAST TECHNIQUE: Multiplanar, multiecho pulse sequences of the brain and surrounding structures were obtained without intravenous contrast. Angiographic images of the Circle of Willis were obtained using MRA technique without intravenous contrast. Angiographic images of the neck were obtained using MRA technique without intravenous contrast. Carotid stenosis measurements (when applicable) are obtained utilizing NASCET criteria, using the distal internal carotid diameter as the denominator. COMPARISON:  Prior CT from earlier the same day as well as previous MRI from 09/05/2020. FINDINGS: MRI HEAD FINDINGS Brain: Cerebral volume stable, and remains within normal limits. Scattered patchy T2/FLAIR hyperintensity involving the periventricular and deep white matter both cerebral hemispheres as well as the pons, presumably related to chronic microvascular ischemic disease. Changes are mildly progressed as compared to previous MRI. Superimposed remote lacunar infarcts noted about the corpus callosum and left basal ganglia. A new chronic  lacunar infarct noted  at the anterior right basal ganglia, extending towards the operculum (series 7, image 20). 1.4 cm focus of restricted diffusion involving the para median right dorsal pons, consistent with an acute ischemic nonhemorrhagic infarct (series 2, image 15). Additional tiny subcentimeter acute ischemic nonhemorrhagic cortical infarct noted at the left frontal lobe as well (series 2, image 34). No other evidence for acute or subacute ischemia. No acute intracranial hemorrhage. Few scattered chronic micro hemorrhages noted, most pronounced about the thalami, most characteristic of poorly controlled hypertension. No mass lesion, mass effect or midline shift. No hydrocephalus or extra-axial fluid collection. Pituitary gland and suprasellar region within normal limits. Vascular: Major intracranial vascular flow voids are maintained. Skull and upper cervical spine: Craniocervical junction with normal limits. Bone marrow signal intensity normal. No scalp soft tissue abnormality. Sinuses/Orbits: Globes and orbital soft tissues within normal limits. Paranasal sinuses are largely clear. No mastoid effusion. Other: None. MRA HEAD FINDINGS ANTERIOR CIRCULATION: Visualized distal cervical segments of both internal carotid arteries are widely patent with antegrade flow. Petrous, cavernous, and supraclinoid segments patent without stenosis. A1 segments patent bilaterally. Normal anterior communicating artery complex. Focal severe stenosis at the left A2/A3 junction again seen, similar to prior (series 4, image 142). An additional moderate to severe stenosis noted at the contralateral right A2/A3 junction as well (series 4, image 152), progressed from prior. Right M1 segment remains widely patent. Previously seen severe left M1 stenosis appears improved on today's exam, with no more than mild stenosis now seen at this level (series 4, image 115). No proximal MCA branch occlusion or high-grade stenosis. Distal small vessel atheromatous  irregularity again noted. POSTERIOR CIRCULATION: Both vertebral arteries patent without stenosis. Right vertebral artery dominant. Left PICA patent. Right PICA not well seen. Basilar patent without stenosis. Superior cerebral arteries patent bilaterally. PCAs widely patent to their distal aspects without significant stenosis. Small bilateral posterior communicating arteries noted. No intracranial aneurysm. MRA NECK FINDINGS AORTIC ARCH: Visualized aortic arch normal caliber with standard branch pattern. No visible stenosis about the origin of the great vessels. RIGHT CAROTID SYSTEM: Right common and internal carotid arteries are patent without evidence for dissection. No significant atheromatous irregularity or stenosis about the right carotid bulb. LEFT CAROTID SYSTEM: Left common and internal carotid arteries remain patent without dissection. Mild eccentric plaque within the mid-distal left CCA without hemodynamically significant greater than 50% stenosis. No significant atheromatous narrowing or irregularity about the left carotid bulb itself. VERTEBRAL ARTERIES: Left vertebral artery arises directly from the aortic arch. Right vertebral artery slightly dominant. Vertebral arteries patent without evidence for dissection or occlusion. IMPRESSION: MRI HEAD IMPRESSION: 1. 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 2. Additional subcentimeter acute ischemic nonhemorrhagic cortical infarct involving the left frontal lobe. 3. Underlying chronic microvascular ischemic disease with multiple additional remote lacunar infarcts as above. Overall, appearance is mildly progressed as compared to previous MRI from 09/05/2020. 4. Few scattered chronic micro hemorrhages, most characteristic of poorly controlled hypertension. MRA HEAD IMPRESSION: 1. Negative intracranial MRA for large vessel occlusion. 2. Severe left A2/A3 stenosis, similar compared to previous MRA from 09/05/2020. 3. Moderate to severe  contralateral right A2/A3 stenosis, progressed from prior. 4. Interval improvement in previously seen severe left M1 stenosis, now no more than mild in nature. 5. Distal small vessel atheromatous irregularity. MRA NECK IMPRESSION: Continued wide patency of both carotid artery systems and vertebral arteries within the neck. No hemodynamically significant stenosis or other acute vascular abnormality. Electronically Signed  By: Jeannine Boga M.D.   On: 11/09/2022 22:50   MR ANGIO NECK WO CONTRAST  Result Date: 11/09/2022 CLINICAL DATA:  Initial evaluation for neuro deficit, stroke suspected. EXAM: MRI HEAD WITHOUT CONTRAST MRA HEAD WITHOUT CONTRAST MRA NECK WITHOUT CONTRAST TECHNIQUE: Multiplanar, multiecho pulse sequences of the brain and surrounding structures were obtained without intravenous contrast. Angiographic images of the Circle of Willis were obtained using MRA technique without intravenous contrast. Angiographic images of the neck were obtained using MRA technique without intravenous contrast. Carotid stenosis measurements (when applicable) are obtained utilizing NASCET criteria, using the distal internal carotid diameter as the denominator. COMPARISON:  Prior CT from earlier the same day as well as previous MRI from 09/05/2020. FINDINGS: MRI HEAD FINDINGS Brain: Cerebral volume stable, and remains within normal limits. Scattered patchy T2/FLAIR hyperintensity involving the periventricular and deep white matter both cerebral hemispheres as well as the pons, presumably related to chronic microvascular ischemic disease. Changes are mildly progressed as compared to previous MRI. Superimposed remote lacunar infarcts noted about the corpus callosum and left basal ganglia. A new chronic lacunar infarct noted at the anterior right basal ganglia, extending towards the operculum (series 7, image 20). 1.4 cm focus of restricted diffusion involving the para median right dorsal pons, consistent with an  acute ischemic nonhemorrhagic infarct (series 2, image 15). Additional tiny subcentimeter acute ischemic nonhemorrhagic cortical infarct noted at the left frontal lobe as well (series 2, image 34). No other evidence for acute or subacute ischemia. No acute intracranial hemorrhage. Few scattered chronic micro hemorrhages noted, most pronounced about the thalami, most characteristic of poorly controlled hypertension. No mass lesion, mass effect or midline shift. No hydrocephalus or extra-axial fluid collection. Pituitary gland and suprasellar region within normal limits. Vascular: Major intracranial vascular flow voids are maintained. Skull and upper cervical spine: Craniocervical junction with normal limits. Bone marrow signal intensity normal. No scalp soft tissue abnormality. Sinuses/Orbits: Globes and orbital soft tissues within normal limits. Paranasal sinuses are largely clear. No mastoid effusion. Other: None. MRA HEAD FINDINGS ANTERIOR CIRCULATION: Visualized distal cervical segments of both internal carotid arteries are widely patent with antegrade flow. Petrous, cavernous, and supraclinoid segments patent without stenosis. A1 segments patent bilaterally. Normal anterior communicating artery complex. Focal severe stenosis at the left A2/A3 junction again seen, similar to prior (series 4, image 142). An additional moderate to severe stenosis noted at the contralateral right A2/A3 junction as well (series 4, image 152), progressed from prior. Right M1 segment remains widely patent. Previously seen severe left M1 stenosis appears improved on today's exam, with no more than mild stenosis now seen at this level (series 4, image 115). No proximal MCA branch occlusion or high-grade stenosis. Distal small vessel atheromatous irregularity again noted. POSTERIOR CIRCULATION: Both vertebral arteries patent without stenosis. Right vertebral artery dominant. Left PICA patent. Right PICA not well seen. Basilar patent  without stenosis. Superior cerebral arteries patent bilaterally. PCAs widely patent to their distal aspects without significant stenosis. Small bilateral posterior communicating arteries noted. No intracranial aneurysm. MRA NECK FINDINGS AORTIC ARCH: Visualized aortic arch normal caliber with standard branch pattern. No visible stenosis about the origin of the great vessels. RIGHT CAROTID SYSTEM: Right common and internal carotid arteries are patent without evidence for dissection. No significant atheromatous irregularity or stenosis about the right carotid bulb. LEFT CAROTID SYSTEM: Left common and internal carotid arteries remain patent without dissection. Mild eccentric plaque within the mid-distal left CCA without hemodynamically significant greater than 50% stenosis. No significant atheromatous  narrowing or irregularity about the left carotid bulb itself. VERTEBRAL ARTERIES: Left vertebral artery arises directly from the aortic arch. Right vertebral artery slightly dominant. Vertebral arteries patent without evidence for dissection or occlusion. IMPRESSION: MRI HEAD IMPRESSION: 1. 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 2. Additional subcentimeter acute ischemic nonhemorrhagic cortical infarct involving the left frontal lobe. 3. Underlying chronic microvascular ischemic disease with multiple additional remote lacunar infarcts as above. Overall, appearance is mildly progressed as compared to previous MRI from 09/05/2020. 4. Few scattered chronic micro hemorrhages, most characteristic of poorly controlled hypertension. MRA HEAD IMPRESSION: 1. Negative intracranial MRA for large vessel occlusion. 2. Severe left A2/A3 stenosis, similar compared to previous MRA from 09/05/2020. 3. Moderate to severe contralateral right A2/A3 stenosis, progressed from prior. 4. Interval improvement in previously seen severe left M1 stenosis, now no more than mild in nature. 5. Distal small vessel  atheromatous irregularity. MRA NECK IMPRESSION: Continued wide patency of both carotid artery systems and vertebral arteries within the neck. No hemodynamically significant stenosis or other acute vascular abnormality. Electronically Signed   By: Jeannine Boga M.D.   On: 11/09/2022 22:50   CT HEAD WO CONTRAST (5MM)  Result Date: 11/09/2022 CLINICAL DATA:  Neuro deficit, acute stroke suspected EXAM: CT HEAD WITHOUT CONTRAST TECHNIQUE: Contiguous axial images were obtained from the base of the skull through the vertex without intravenous contrast. RADIATION DOSE REDUCTION: This exam was performed according to the departmental dose-optimization program which includes automated exposure control, adjustment of the mA and/or kV according to patient size and/or use of iterative reconstruction technique. COMPARISON:  CT brain, 09/05/2020 FINDINGS: Brain: New, although age indeterminate and subacute to chronic appearing infarctions of the right anterior limb of the internal capsule and caudate head as well as the adjacent corona radiata (series 3, image 14). Unchanged chronic left basal ganglia and corona radiata infarctions (series 3, image 18). No hemorrhage, hydrocephalus, extra-axial collection or mass lesion/mass effect. Vascular: No hyperdense vessel or unexpected calcification. Skull: Normal. Negative for fracture or focal lesion. Sinuses/Orbits: No acute finding. Other: None. IMPRESSION: 1. New, although age indeterminate and subacute to chronic appearing infarctions of the right anterior limb of the internal capsule and caudate head as well as the adjacent corona radiata. Consider MRI to evaluate for acutely diffusion restricting infarction if suspected. 2. Unchanged chronic left basal ganglia and corona radiata infarctions. 3. No acute intracranial hemorrhage. Electronically Signed   By: Delanna Ahmadi M.D.   On: 11/09/2022 19:09   DG Chest Portable 1 View  Result Date: 11/09/2022 CLINICAL DATA:   Presyncope, dyspnea EXAM: PORTABLE CHEST 1 VIEW COMPARISON:  05/25/2018 FINDINGS: The heart size and mediastinal contours are within normal limits. Both lungs are clear. The visualized skeletal structures are unremarkable. Surgical clips again noted at the neck base bilaterally. IMPRESSION: No active disease. Electronically Signed   By: Fidela Salisbury M.D.   On: 11/09/2022 17:47     PHYSICAL EXAM  Temp:  [97.9 F (36.6 C)-99 F (37.2 C)] 98.8 F (37.1 C) (12/24 1203) Pulse Rate:  [51-77] 64 (12/24 1203) Resp:  [12-27] 17 (12/24 1203) BP: (125-160)/(71-100) 139/81 (12/24 1203) SpO2:  [93 %-100 %] 93 % (12/24 1203)  General - Well nourished, well developed, in no apparent distress.  Ophthalmologic - fundi not visualized due to noncooperation.  Cardiovascular - Regular rhythm and rate.  Mental Status -  Level of arousal and orientation to time, place, and person were intact. Language including expression, naming, repetition, comprehension was  assessed and found intact. Mild dysarthria Fund of Knowledge was assessed and was intact.  Cranial Nerves II - XII - II - Visual field intact OU. III, IV, VI - Extraocular movement exam showed right INO V - Facial sensation mildly decreased on the left.  VII - Facial movement exam showed slight nasolabial fold flattening on the left. VIII - Hearing & vestibular intact bilaterally. X - Palate elevates symmetrically. XI - Chin turning & shoulder shrug intact bilaterally. XII - Tongue protrusion intact.  Motor Strength - The patient's strength was normal in all extremities and pronator drift was absent.  Bulk was normal and fasciculations were absent.   Motor Tone - Muscle tone was assessed at the neck and appendages and was normal.  Reflexes - The patient's reflexes were symmetrical in all extremities and she had no pathological reflexes.  Sensory - Light touch, temperature/pinprick were assessed and were mildly decreased on the left UE and  LE.    Coordination - The patient had normal movements in the hands with no ataxia or dysmetria. However, b/l LE HTS dysmetria/mild ataxia. Tremor was absent.  Gait and Station - deferred.   ASSESSMENT/PLAN Ms. SHAKURA COWING is a 56 y.o. female with history of seizure, cocaine abuse, smoker, HTN, stroke admitted for blurry vision, dizziness, and headache. No tPA given due to outside window.    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 Patient counseled to be compliant with her antithrombotic medications Ongoing aggressive stroke risk factor management Therapy recommendations: SNF Disposition: Pending  History of stroke Stroke in 2010 12/2015 admitted for right-sided facial droop right upper extremity weakness and slurred speech.  CT no acute abnormality.  CT repeat showed left caudate head small infarct with small hemorrhagic transformation.  2D echo unremarkable.  Carotid Doppler negative.  LDL 119, A1c 5.6, UDS positive for cocaine, discharged on aspirin and Zocor.    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 Please  call with questions. Pt will follow up with stroke clinic NP at [facility] in about 4 weeks. Thanks for the consult.   Rosalin Hawking, MD PhD Stroke Neurology 11/10/2022 1:28 PM    To contact Stroke Continuity provider, please refer to [email or web address] After hours, contact General Neurology

## 2022-11-10 NOTE — Consult Note (Signed)
Neurology Consultation Reason for Consult: Double vision Referring Physician: Ruel Favors  CC: Double vision  History is obtained from: Patient  HPI: Theresa Watkins is a 56 y.o. female with history of substance abuse, stroke, seizures who presents with double vision that started on Friday.  She states that she woke up on Friday morning with double vision. she also has noticed some slurred speech since that time as well and has been unsteady.  She has complained of occasional pains of her left arm, and has had chest pain previously but none currently.   LKW: Thursday prior to bed tpa given?: no, out of window    Past Medical History:  Diagnosis Date   Abnormal Pap smear    Bronchitis    Fibroid    Headache(784.0)    Ovarian cyst    Plantar fasciitis    Seizures (HCC)    Stroke [facility])    Thyroid cancer ([place]) [address]   Trichomonas    Urinary tract infection     Family History  Problem Relation Age of Onset   Bell's palsy Mother    Hypertension Mother    Diabetes Mother    Stroke Father    Prostate cancer Father    Breast cancer Cousin    Hypertension Sister    Colon cancer Maternal Aunt    Colon cancer Paternal Aunt    Diverticulitis Sister    Diabetes Sister      Social History:  reports that she has been smoking cigarettes. She has a 2.50 pack-year smoking history. She has never used smokeless tobacco. She reports current alcohol use. She reports current drug use. Drug: Cocaine.   Exam: Current vital signs: BP 137/71   Pulse 62   Temp 98.4 F (36.9 C)   Resp 17   Ht '5\' 5"'$  (1.651 m)   Wt 72.6 kg   SpO2 93%   BMI 26.63 kg/m  Vital signs in last 24 hours: Temp:  [97.9 F (36.6 C)-98.7 F (37.1 C)] 98.4 F (36.9 C) (12/23 1810) Pulse Rate:  [51-77] 62 (12/24 0015) Resp:  [12-27] 17 (12/24 0015) BP: (125-155)/(71-100) 137/71 (12/24 0015) SpO2:  [93 %-100 %] 93 % (12/24 0015)   Physical Exam  Constitutional: Appears well-developed and well-nourished.   Psych: Affect appropriate to situation Eyes: No scleral injection HENT: No OP obstruction MSK: no joint deformities.  Cardiovascular: Normal rate and regular rhythm.  Respiratory: Effort normal, non-labored breathing GI: Soft.  No distension. There is no tenderness.  Skin: WDI  Neuro: Mental Status: Patient is awake, alert, oriented to person, place, month, year, and situation. Patient is able to give a clear and coherent history. No signs of aphasia or neglect Cranial Nerves: II: Visual Fields are full. Pupils are equal, round, and reactive to light.   III,IV, VI: She has an INO with no medial movement of her right eye and nystagmus of her left V: Facial sensation is symmetric to temperature VII: Facial movement is symmetric.  VIII: hearing is intact to voice X: Uvula elevates symmetrically XI: Shoulder shrug is symmetric. XII: tongue is midline without atrophy or fasciculations.  Motor: Tone is normal. Bulk is normal. 5/5 strength was present in all four extremities.  Sensory: Sensation is symmetric to light touch and temperature in the arms and legs. Cerebellar: She has difficulty with finger-nose-finger and difficulty complying with my instructions to close an eye to test if this is a visual problem, but I do not think she has any definite ataxia.  I have reviewed labs in epic and the results pertinent to this consultation are: UDS positive for cocaine  I have reviewed the images obtained: MRI brain-two infarcts one in the cortex and one in the pons.  Impression: 56 year old female with multiple strokes, in the setting of cocaine use I suspect that this is likely related to cocaine use.  I encouraged her to stop using cocaine as well as to stop smoking.  She will need other risk factors including her heart.  She also need therapy evaluations.  Recommendations: - HgbA1c, fasting lipid panel - Frequent neuro checks - Echocardiogram - Prophylactic therapy-Antiplatelet  med: Aspirin - dose '81mg'$  and plavix '75mg'$  daily  after '300mg'$  load  - Risk factor modification - Telemetry monitoring - PT consult, OT consult, Speech consult - Stroke team to follow    Roland Rack, MD [facility] [phone]  If 7pm- 7am, please page neurology on call as listed in [place].

## 2022-11-10 NOTE — Plan of Care (Signed)

## 2022-11-10 NOTE — Progress Notes (Signed)
*  PRELIMINARY RESULTS* Echocardiogram 2D Echocardiogram has been performed.  Theresa Watkins 11/10/2022, 11:01 AM

## 2022-11-11 DIAGNOSIS — I639 Cerebral infarction, unspecified: Secondary | ICD-10-CM | POA: Diagnosis not present

## 2022-11-11 DIAGNOSIS — I10 Essential (primary) hypertension: Secondary | ICD-10-CM | POA: Diagnosis not present

## 2022-11-11 DIAGNOSIS — E785 Hyperlipidemia, unspecified: Secondary | ICD-10-CM | POA: Diagnosis not present

## 2022-11-11 DIAGNOSIS — F1410 Cocaine abuse, uncomplicated: Secondary | ICD-10-CM | POA: Diagnosis not present

## 2022-11-11 LAB — CBC
HCT: 45.4 % (ref 36.0–46.0)
Hemoglobin: 15.1 g/dL — ABNORMAL HIGH (ref 12.0–15.0)
MCH: 28.8 pg (ref 26.0–34.0)
MCHC: 33.3 g/dL (ref 30.0–36.0)
MCV: 86.6 fL (ref 80.0–100.0)
Platelets: 173 10*3/uL (ref 150–400)
RBC: 5.24 MIL/uL — ABNORMAL HIGH (ref 3.87–5.11)
RDW: 12.9 % (ref 11.5–15.5)
WBC: 5.7 10*3/uL (ref 4.0–10.5)
nRBC: 0 % (ref 0.0–0.2)

## 2022-11-11 LAB — BASIC METABOLIC PANEL
Anion gap: 11 (ref 5–15)
BUN: 14 mg/dL (ref 6–20)
CO2: 20 mmol/L — ABNORMAL LOW (ref 22–32)
Calcium: 8.4 mg/dL — ABNORMAL LOW (ref 8.9–10.3)
Chloride: 108 mmol/L (ref 98–111)
Creatinine, Ser: 0.82 mg/dL (ref 0.44–1.00)
GFR, Estimated: >60 mL/min (ref >60)
Glucose, Bld: 107 mg/dL — ABNORMAL HIGH (ref 70–99)
Potassium: 3.5 mmol/L (ref 3.5–5.1)
Sodium: 139 mmol/L (ref 135–145)

## 2022-11-11 MED ORDER — POTASSIUM CHLORIDE CRYS ER 20 MEQ PO TBCR
40.0000 meq | EXTENDED_RELEASE_TABLET | Freq: Once | ORAL | Status: AC
  Administered 2022-11-11: 40 meq via ORAL
  Filled 2022-11-11: qty 2

## 2022-11-11 NOTE — Progress Notes (Signed)
[facility] PROGRESS NOTE   Theresa Watkins XBD:[id number] DOB: January 27, 1966 DOA: 11/08/2022  PCP: Ladell Pier, MD  Brief History/Interval Summary: 56 y.o. female with medical history significant for prior stroke in 2017, ongoing cocaine abuse, ongoing smoking, HTN and HLD not regularly taking her meds, former EtOH abuse though not any more per pt. patient presented with lightheadedness generalized weakness especially weakness in the left arm and leg.  Workup revealed acute stroke.  She was hospitalized for further management.  Consultants: Neurology  Procedures: Echocardiogram    Subjective/Interval History: Patient continues to have blurry vision.  Left sided weakness appears to be about the same.    Assessment/Plan:  Acute ischemic stroke Patient with left-sided weakness and visual impairments. Patient underwent MRI brain, MRA head and neck. Seen by neurology. Aspirin and Plavix recommended for 3 weeks followed by Plavix alone.  Patient counseled to stop doing cocaine. LDL noted to be 152.  Patient started on rosuvastatin. HbA1c is pending. PT OT has seen and SNF is recommended for rehabilitation. Echocardiogram showed normal systolic function.  Trivial MR noted.  No other valvular abnormalities appreciated.    Cocaine abuse Patient counseled.  Essential hypertension Permissive hypertension being allowed.  Amlodipine on hold.  Blood pressure is reasonably well-controlled.  Hyperlipidemia LDL 152 Was on pravastatin but likely noncompliant.  Changed over to rosuvastatin.  Hypothyroidism Continue levothyroxine.  History of alcohol abuse This is apparently a previous history.  Not drinking any importer as per patient.  Tobacco abuse Counseled.   DVT Prophylaxis: Lovenox Code Status: Full code Family Communication: Discussed with patient Disposition Plan: SNF.  TOC will be consulted.  Status is: Inpatient Remains inpatient appropriate because:  Acute stroke     Medications: Scheduled:  aspirin EC  81 mg Oral Daily   clopidogrel  75 mg Oral Daily   enoxaparin (LOVENOX) injection  40 mg Subcutaneous Q24H   levothyroxine  125 mcg Oral QAC breakfast   rosuvastatin  40 mg Oral Daily   Continuous: [id number] **OR** acetaminophen (TYLENOL) oral liquid 160 mg/5 mL **OR** acetaminophen, LORazepam, LORazepam  Antibiotics: Anti-infectives (From admission, onward)    None       Objective:  Vital Signs  Vitals:   11/10/22 2315 11/10/22 2315 11/11/22 0349 11/11/22 0724  BP: 136/72 136/72 138/85 (!) 145/99  Pulse: 82 80 80 62  Resp: '16 16 16 19  '$ Temp: 98.6 F (37 C) 98.6 F (37 C) 98.6 F (37 C) 99.2 F (37.3 C)  TempSrc: Oral Oral Oral Oral  SpO2: 96% 99% 99% 98%  Weight:      Height:       No intake or output data in the 24 hours ending 11/11/22 0950  Filed Weights   11/08/22 1825  Weight: 72.6 kg    General appearance: Awake alert.  In no distress Resp: Clear to auscultation bilaterally.  Normal effort Cardio: S1-S2 is normal regular.  No S3-S4.  Systolic murmur appreciated over the precordium GI: Abdomen is soft.  Nontender nondistended.  Bowel sounds are present normal.  No masses organomegaly Extremities: No edema.  Full range of motion of lower extremities. Neurologic: Alert and oriented x3.  Left-sided weakness noted.   Lab Results:  Data Reviewed: I have personally reviewed following labs and reports of the imaging studies  CBC: Recent Labs  Lab 11/08/22 1849 11/11/22 0258  WBC 5.6 5.7  NEUTROABS 3.4  --   HGB 16.2* 15.1*  HCT 49.1* 45.4  MCV 86.0 86.6  PLT  196 173     Basic Metabolic Panel: Recent Labs  Lab 11/08/22 1849 11/09/22 1718 11/11/22 0258  NA 142  --  139  K 3.8  --  3.5  CL 105  --  108  CO2 25  --  20*  GLUCOSE 102*  --  107*  BUN 11  --  14  CREATININE 0.90  --  0.82  CALCIUM 8.5*  --  8.4*  MG  --  1.9  --      GFR: Estimated Creatinine Clearance:  75.5 mL/min (by C-G formula based on SCr of 0.82 mg/dL).  Liver Function Tests: Recent Labs  Lab 11/08/22 1849  AST 15  ALT 14  ALKPHOS 55  BILITOT 0.8  PROT 6.7  ALBUMIN 4.1      Lipid Profile: Recent Labs    11/10/22 0321  CHOL 223*  HDL 51  LDLCALC 152*  TRIG 100  CHOLHDL 4.4     Radiology Studies: ECHOCARDIOGRAM COMPLETE  Result Date: 11/10/2022    ECHOCARDIOGRAM REPORT   Patient Name:   Theresa Watkins Date of Exam: 11/10/2022 Medical Rec #:  [id number]         Height:       65.0 in Accession #:    [id number]        Weight:       160.0 lb Date of Birth:  05/18/1966         BSA:          1.799 m Patient Age:    75 years          BP:           142/77 mmHg Patient Gender: F                 HR:           65 bpm. Exam Location:  Inpatient Procedure: 2D Echo, Cardiac Doppler and Color Doppler Indications:    I63.9 Stroke  History:        Patient has prior history of Echocardiogram examinations, most                 recent 12/25/2015. Risk Factors:Hypertension, Dyslipidemia and                 Current Smoker. ETOH and Cocaine abuse.  Sonographer:    Alvino Chapel RCS Referring Phys: [place]  1. Left ventricular ejection fraction, by estimation, is 60 to 65%. The left ventricle has normal function. The left ventricle has no regional wall motion abnormalities. There is moderate left ventricular hypertrophy. Left ventricular diastolic parameters are indeterminate.  2. Right ventricular systolic function is normal. The right ventricular size is normal. There is normal pulmonary artery systolic pressure.  3. Mobile atrial septal aneursym no obvious PFO/shunt.  4. The mitral valve is abnormal. Trivial mitral valve regurgitation. No evidence of mitral stenosis.  5. The aortic valve is tricuspid. Aortic valve regurgitation is not visualized. No aortic stenosis is present.  6. The inferior vena cava is normal in size with greater than 50% respiratory variability,  suggesting right atrial pressure of 3 mmHg. FINDINGS  Left Ventricle: Left ventricular ejection fraction, by estimation, is 60 to 65%. The left ventricle has normal function. The left ventricle has no regional wall motion abnormalities. The left ventricular internal cavity size was normal in size. There is  moderate left ventricular hypertrophy. Left ventricular diastolic parameters are indeterminate. Right Ventricle: The right ventricular size  is normal. No increase in right ventricular wall thickness. Right ventricular systolic function is normal. There is normal pulmonary artery systolic pressure. The tricuspid regurgitant velocity is 1.88 m/s, and  with an assumed right atrial pressure of 3 mmHg, the estimated right ventricular systolic pressure is 77.4 mmHg. Left Atrium: Left atrial size was normal in size. Right Atrium: Right atrial size was normal in size. Pericardium: There is no evidence of pericardial effusion. Mitral Valve: The mitral valve is abnormal. There is mild thickening of the mitral valve leaflet(s). Trivial mitral valve regurgitation. No evidence of mitral valve stenosis. Tricuspid Valve: The tricuspid valve is normal in structure. Tricuspid valve regurgitation is trivial. No evidence of tricuspid stenosis. Aortic Valve: The aortic valve is tricuspid. Aortic valve regurgitation is not visualized. No aortic stenosis is present. Pulmonic Valve: The pulmonic valve was normal in structure. Pulmonic valve regurgitation is not visualized. No evidence of pulmonic stenosis. Aorta: The aortic root is normal in size and structure. Venous: The inferior vena cava is normal in size with greater than 50% respiratory variability, suggesting right atrial pressure of 3 mmHg. IAS/Shunts: No atrial level shunt detected by color flow Doppler.  LEFT VENTRICLE PLAX 2D LVIDd:         4.20 cm   Diastology LVIDs:         2.70 cm   LV e' medial:    5.22 cm/s LV PW:         1.40 cm   LV E/e' medial:  11.0 LV IVS:         1.30 cm   LV e' lateral:   6.96 cm/s LVOT diam:     2.10 cm   LV E/e' lateral: 8.2 LV SV:         64 LV SV Index:   35 LVOT Area:     3.46 cm  RIGHT VENTRICLE RV S prime:     12.70 cm/s TAPSE (M-mode): 1.7 cm LEFT ATRIUM             Index        RIGHT ATRIUM           Index LA diam:        3.40 cm 1.89 cm/m   RA Area:     14.10 cm LA Vol (A2C):   49.4 ml 27.46 ml/m  RA Volume:   35.20 ml  19.56 ml/m LA Vol (A4C):   43.5 ml 24.18 ml/m LA Biplane Vol: 49.3 ml 27.40 ml/m  AORTIC VALVE LVOT Vmax:   85.50 cm/s LVOT Vmean:  55.700 cm/s LVOT VTI:    0.184 m  AORTA Ao Root diam: 3.20 cm MITRAL VALVE               TRICUSPID VALVE MV Area (PHT): 2.62 cm    TR Peak grad:   14.1 mmHg MV Decel Time: 289 msec    TR Vmax:        188.00 cm/s MV E velocity: 57.20 cm/s MV A velocity: 70.30 cm/s  SHUNTS MV E/A ratio:  0.81        Systemic VTI:  0.18 m                            Systemic Diam: 2.10 cm Jenkins Rouge MD Electronically signed by Jenkins Rouge MD Signature Date/Time: 11/10/2022/11:08:52 AM    Final    MR BRAIN WO CONTRAST  Result Date: 11/09/2022 CLINICAL DATA:  Initial  evaluation for neuro deficit, stroke suspected. EXAM: MRI HEAD WITHOUT CONTRAST MRA HEAD WITHOUT CONTRAST MRA NECK WITHOUT CONTRAST TECHNIQUE: Multiplanar, multiecho pulse sequences of the brain and surrounding structures were obtained without intravenous contrast. Angiographic images of the Circle of Willis were obtained using MRA technique without intravenous contrast. Angiographic images of the neck were obtained using MRA technique without intravenous contrast. Carotid stenosis measurements (when applicable) are obtained utilizing NASCET criteria, using the distal internal carotid diameter as the denominator. COMPARISON:  Prior CT from earlier the same day as well as previous MRI from 09/05/2020. FINDINGS: MRI HEAD FINDINGS Brain: Cerebral volume stable, and remains within normal limits. Scattered patchy T2/FLAIR hyperintensity involving the  periventricular and deep white matter both cerebral hemispheres as well as the pons, presumably related to chronic microvascular ischemic disease. Changes are mildly progressed as compared to previous MRI. Superimposed remote lacunar infarcts noted about the corpus callosum and left basal ganglia. A new chronic lacunar infarct noted at the anterior right basal ganglia, extending towards the operculum (series 7, image 20). 1.4 cm focus of restricted diffusion involving the para median right dorsal pons, consistent with an acute ischemic nonhemorrhagic infarct (series 2, image 15). Additional tiny subcentimeter acute ischemic nonhemorrhagic cortical infarct noted at the left frontal lobe as well (series 2, image 34). No other evidence for acute or subacute ischemia. No acute intracranial hemorrhage. Few scattered chronic micro hemorrhages noted, most pronounced about the thalami, most characteristic of poorly controlled hypertension. No mass lesion, mass effect or midline shift. No hydrocephalus or extra-axial fluid collection. Pituitary gland and suprasellar region within normal limits. Vascular: Major intracranial vascular flow voids are maintained. Skull and upper cervical spine: Craniocervical junction with normal limits. Bone marrow signal intensity normal. No scalp soft tissue abnormality. Sinuses/Orbits: Globes and orbital soft tissues within normal limits. Paranasal sinuses are largely clear. No mastoid effusion. Other: None. MRA HEAD FINDINGS ANTERIOR CIRCULATION: Visualized distal cervical segments of both internal carotid arteries are widely patent with antegrade flow. Petrous, cavernous, and supraclinoid segments patent without stenosis. A1 segments patent bilaterally. Normal anterior communicating artery complex. Focal severe stenosis at the left A2/A3 junction again seen, similar to prior (series 4, image 142). An additional moderate to severe stenosis noted at the contralateral right A2/A3 junction as  well (series 4, image 152), progressed from prior. Right M1 segment remains widely patent. Previously seen severe left M1 stenosis appears improved on today's exam, with no more than mild stenosis now seen at this level (series 4, image 115). No proximal MCA branch occlusion or high-grade stenosis. Distal small vessel atheromatous irregularity again noted. POSTERIOR CIRCULATION: Both vertebral arteries patent without stenosis. Right vertebral artery dominant. Left PICA patent. Right PICA not well seen. Basilar patent without stenosis. Superior cerebral arteries patent bilaterally. PCAs widely patent to their distal aspects without significant stenosis. Small bilateral posterior communicating arteries noted. No intracranial aneurysm. MRA NECK FINDINGS AORTIC ARCH: Visualized aortic arch normal caliber with standard branch pattern. No visible stenosis about the origin of the great vessels. RIGHT CAROTID SYSTEM: Right common and internal carotid arteries are patent without evidence for dissection. No significant atheromatous irregularity or stenosis about the right carotid bulb. LEFT CAROTID SYSTEM: Left common and internal carotid arteries remain patent without dissection. Mild eccentric plaque within the mid-distal left CCA without hemodynamically significant greater than 50% stenosis. No significant atheromatous narrowing or irregularity about the left carotid bulb itself. VERTEBRAL ARTERIES: Left vertebral artery arises directly from the aortic arch. Right vertebral artery slightly dominant.  Vertebral arteries patent without evidence for dissection or occlusion. IMPRESSION: MRI HEAD IMPRESSION: 1. 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 2. Additional subcentimeter acute ischemic nonhemorrhagic cortical infarct involving the left frontal lobe. 3. Underlying chronic microvascular ischemic disease with multiple additional remote lacunar infarcts as above. Overall, appearance is mildly  progressed as compared to previous MRI from 09/05/2020. 4. Few scattered chronic micro hemorrhages, most characteristic of poorly controlled hypertension. MRA HEAD IMPRESSION: 1. Negative intracranial MRA for large vessel occlusion. 2. Severe left A2/A3 stenosis, similar compared to previous MRA from 09/05/2020. 3. Moderate to severe contralateral right A2/A3 stenosis, progressed from prior. 4. Interval improvement in previously seen severe left M1 stenosis, now no more than mild in nature. 5. Distal small vessel atheromatous irregularity. MRA NECK IMPRESSION: Continued wide patency of both carotid artery systems and vertebral arteries within the neck. No hemodynamically significant stenosis or other acute vascular abnormality. Electronically Signed   By: Jeannine Boga M.D.   On: 11/09/2022 22:50   MR ANGIO HEAD WO CONTRAST  Result Date: 11/09/2022 CLINICAL DATA:  Initial evaluation for neuro deficit, stroke suspected. EXAM: MRI HEAD WITHOUT CONTRAST MRA HEAD WITHOUT CONTRAST MRA NECK WITHOUT CONTRAST TECHNIQUE: Multiplanar, multiecho pulse sequences of the brain and surrounding structures were obtained without intravenous contrast. Angiographic images of the Circle of Willis were obtained using MRA technique without intravenous contrast. Angiographic images of the neck were obtained using MRA technique without intravenous contrast. Carotid stenosis measurements (when applicable) are obtained utilizing NASCET criteria, using the distal internal carotid diameter as the denominator. COMPARISON:  Prior CT from earlier the same day as well as previous MRI from 09/05/2020. FINDINGS: MRI HEAD FINDINGS Brain: Cerebral volume stable, and remains within normal limits. Scattered patchy T2/FLAIR hyperintensity involving the periventricular and deep white matter both cerebral hemispheres as well as the pons, presumably related to chronic microvascular ischemic disease. Changes are mildly progressed as compared to  previous MRI. Superimposed remote lacunar infarcts noted about the corpus callosum and left basal ganglia. A new chronic lacunar infarct noted at the anterior right basal ganglia, extending towards the operculum (series 7, image 20). 1.4 cm focus of restricted diffusion involving the para median right dorsal pons, consistent with an acute ischemic nonhemorrhagic infarct (series 2, image 15). Additional tiny subcentimeter acute ischemic nonhemorrhagic cortical infarct noted at the left frontal lobe as well (series 2, image 34). No other evidence for acute or subacute ischemia. No acute intracranial hemorrhage. Few scattered chronic micro hemorrhages noted, most pronounced about the thalami, most characteristic of poorly controlled hypertension. No mass lesion, mass effect or midline shift. No hydrocephalus or extra-axial fluid collection. Pituitary gland and suprasellar region within normal limits. Vascular: Major intracranial vascular flow voids are maintained. Skull and upper cervical spine: Craniocervical junction with normal limits. Bone marrow signal intensity normal. No scalp soft tissue abnormality. Sinuses/Orbits: Globes and orbital soft tissues within normal limits. Paranasal sinuses are largely clear. No mastoid effusion. Other: None. MRA HEAD FINDINGS ANTERIOR CIRCULATION: Visualized distal cervical segments of both internal carotid arteries are widely patent with antegrade flow. Petrous, cavernous, and supraclinoid segments patent without stenosis. A1 segments patent bilaterally. Normal anterior communicating artery complex. Focal severe stenosis at the left A2/A3 junction again seen, similar to prior (series 4, image 142). An additional moderate to severe stenosis noted at the contralateral right A2/A3 junction as well (series 4, image 152), progressed from prior. Right M1 segment remains widely patent. Previously seen severe left M1 stenosis appears improved on  today's exam, with no more than mild  stenosis now seen at this level (series 4, image 115). No proximal MCA branch occlusion or high-grade stenosis. Distal small vessel atheromatous irregularity again noted. POSTERIOR CIRCULATION: Both vertebral arteries patent without stenosis. Right vertebral artery dominant. Left PICA patent. Right PICA not well seen. Basilar patent without stenosis. Superior cerebral arteries patent bilaterally. PCAs widely patent to their distal aspects without significant stenosis. Small bilateral posterior communicating arteries noted. No intracranial aneurysm. MRA NECK FINDINGS AORTIC ARCH: Visualized aortic arch normal caliber with standard branch pattern. No visible stenosis about the origin of the great vessels. RIGHT CAROTID SYSTEM: Right common and internal carotid arteries are patent without evidence for dissection. No significant atheromatous irregularity or stenosis about the right carotid bulb. LEFT CAROTID SYSTEM: Left common and internal carotid arteries remain patent without dissection. Mild eccentric plaque within the mid-distal left CCA without hemodynamically significant greater than 50% stenosis. No significant atheromatous narrowing or irregularity about the left carotid bulb itself. VERTEBRAL ARTERIES: Left vertebral artery arises directly from the aortic arch. Right vertebral artery slightly dominant. Vertebral arteries patent without evidence for dissection or occlusion. IMPRESSION: MRI HEAD IMPRESSION: 1. 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 2. Additional subcentimeter acute ischemic nonhemorrhagic cortical infarct involving the left frontal lobe. 3. Underlying chronic microvascular ischemic disease with multiple additional remote lacunar infarcts as above. Overall, appearance is mildly progressed as compared to previous MRI from 09/05/2020. 4. Few scattered chronic micro hemorrhages, most characteristic of poorly controlled hypertension. MRA HEAD IMPRESSION: 1. Negative  intracranial MRA for large vessel occlusion. 2. Severe left A2/A3 stenosis, similar compared to previous MRA from 09/05/2020. 3. Moderate to severe contralateral right A2/A3 stenosis, progressed from prior. 4. Interval improvement in previously seen severe left M1 stenosis, now no more than mild in nature. 5. Distal small vessel atheromatous irregularity. MRA NECK IMPRESSION: Continued wide patency of both carotid artery systems and vertebral arteries within the neck. No hemodynamically significant stenosis or other acute vascular abnormality. Electronically Signed   By: Jeannine Boga M.D.   On: 11/09/2022 22:50   MR ANGIO NECK WO CONTRAST  Result Date: 11/09/2022 CLINICAL DATA:  Initial evaluation for neuro deficit, stroke suspected. EXAM: MRI HEAD WITHOUT CONTRAST MRA HEAD WITHOUT CONTRAST MRA NECK WITHOUT CONTRAST TECHNIQUE: Multiplanar, multiecho pulse sequences of the brain and surrounding structures were obtained without intravenous contrast. Angiographic images of the Circle of Willis were obtained using MRA technique without intravenous contrast. Angiographic images of the neck were obtained using MRA technique without intravenous contrast. Carotid stenosis measurements (when applicable) are obtained utilizing NASCET criteria, using the distal internal carotid diameter as the denominator. COMPARISON:  Prior CT from earlier the same day as well as previous MRI from 09/05/2020. FINDINGS: MRI HEAD FINDINGS Brain: Cerebral volume stable, and remains within normal limits. Scattered patchy T2/FLAIR hyperintensity involving the periventricular and deep white matter both cerebral hemispheres as well as the pons, presumably related to chronic microvascular ischemic disease. Changes are mildly progressed as compared to previous MRI. Superimposed remote lacunar infarcts noted about the corpus callosum and left basal ganglia. A new chronic lacunar infarct noted at the anterior right basal ganglia, extending  towards the operculum (series 7, image 20). 1.4 cm focus of restricted diffusion involving the para median right dorsal pons, consistent with an acute ischemic nonhemorrhagic infarct (series 2, image 15). Additional tiny subcentimeter acute ischemic nonhemorrhagic cortical infarct noted at the left frontal lobe as well (series 2, image 34). No other  evidence for acute or subacute ischemia. No acute intracranial hemorrhage. Few scattered chronic micro hemorrhages noted, most pronounced about the thalami, most characteristic of poorly controlled hypertension. No mass lesion, mass effect or midline shift. No hydrocephalus or extra-axial fluid collection. Pituitary gland and suprasellar region within normal limits. Vascular: Major intracranial vascular flow voids are maintained. Skull and upper cervical spine: Craniocervical junction with normal limits. Bone marrow signal intensity normal. No scalp soft tissue abnormality. Sinuses/Orbits: Globes and orbital soft tissues within normal limits. Paranasal sinuses are largely clear. No mastoid effusion. Other: None. MRA HEAD FINDINGS ANTERIOR CIRCULATION: Visualized distal cervical segments of both internal carotid arteries are widely patent with antegrade flow. Petrous, cavernous, and supraclinoid segments patent without stenosis. A1 segments patent bilaterally. Normal anterior communicating artery complex. Focal severe stenosis at the left A2/A3 junction again seen, similar to prior (series 4, image 142). An additional moderate to severe stenosis noted at the contralateral right A2/A3 junction as well (series 4, image 152), progressed from prior. Right M1 segment remains widely patent. Previously seen severe left M1 stenosis appears improved on today's exam, with no more than mild stenosis now seen at this level (series 4, image 115). No proximal MCA branch occlusion or high-grade stenosis. Distal small vessel atheromatous irregularity again noted. POSTERIOR CIRCULATION:  Both vertebral arteries patent without stenosis. Right vertebral artery dominant. Left PICA patent. Right PICA not well seen. Basilar patent without stenosis. Superior cerebral arteries patent bilaterally. PCAs widely patent to their distal aspects without significant stenosis. Small bilateral posterior communicating arteries noted. No intracranial aneurysm. MRA NECK FINDINGS AORTIC ARCH: Visualized aortic arch normal caliber with standard branch pattern. No visible stenosis about the origin of the great vessels. RIGHT CAROTID SYSTEM: Right common and internal carotid arteries are patent without evidence for dissection. No significant atheromatous irregularity or stenosis about the right carotid bulb. LEFT CAROTID SYSTEM: Left common and internal carotid arteries remain patent without dissection. Mild eccentric plaque within the mid-distal left CCA without hemodynamically significant greater than 50% stenosis. No significant atheromatous narrowing or irregularity about the left carotid bulb itself. VERTEBRAL ARTERIES: Left vertebral artery arises directly from the aortic arch. Right vertebral artery slightly dominant. Vertebral arteries patent without evidence for dissection or occlusion. IMPRESSION: MRI HEAD IMPRESSION: 1. 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 2. Additional subcentimeter acute ischemic nonhemorrhagic cortical infarct involving the left frontal lobe. 3. Underlying chronic microvascular ischemic disease with multiple additional remote lacunar infarcts as above. Overall, appearance is mildly progressed as compared to previous MRI from 09/05/2020. 4. Few scattered chronic micro hemorrhages, most characteristic of poorly controlled hypertension. MRA HEAD IMPRESSION: 1. Negative intracranial MRA for large vessel occlusion. 2. Severe left A2/A3 stenosis, similar compared to previous MRA from 09/05/2020. 3. Moderate to severe contralateral right A2/A3 stenosis, progressed  from prior. 4. Interval improvement in previously seen severe left M1 stenosis, now no more than mild in nature. 5. Distal small vessel atheromatous irregularity. MRA NECK IMPRESSION: Continued wide patency of both carotid artery systems and vertebral arteries within the neck. No hemodynamically significant stenosis or other acute vascular abnormality. Electronically Signed   By: Jeannine Boga M.D.   On: 11/09/2022 22:50   CT HEAD WO CONTRAST (5MM)  Result Date: 11/09/2022 CLINICAL DATA:  Neuro deficit, acute stroke suspected EXAM: CT HEAD WITHOUT CONTRAST TECHNIQUE: Contiguous axial images were obtained from the base of the skull through the vertex without intravenous contrast. RADIATION DOSE REDUCTION: This exam was performed according to the departmental dose-optimization  program which includes automated exposure control, adjustment of the mA and/or kV according to patient size and/or use of iterative reconstruction technique. COMPARISON:  CT brain, 09/05/2020 FINDINGS: Brain: New, although age indeterminate and subacute to chronic appearing infarctions of the right anterior limb of the internal capsule and caudate head as well as the adjacent corona radiata (series 3, image 14). Unchanged chronic left basal ganglia and corona radiata infarctions (series 3, image 18). No hemorrhage, hydrocephalus, extra-axial collection or mass lesion/mass effect. Vascular: No hyperdense vessel or unexpected calcification. Skull: Normal. Negative for fracture or focal lesion. Sinuses/Orbits: No acute finding. Other: None. IMPRESSION: 1. New, although age indeterminate and subacute to chronic appearing infarctions of the right anterior limb of the internal capsule and caudate head as well as the adjacent corona radiata. Consider MRI to evaluate for acutely diffusion restricting infarction if suspected. 2. Unchanged chronic left basal ganglia and corona radiata infarctions. 3. No acute intracranial hemorrhage.  Electronically Signed   By: Delanna Ahmadi M.D.   On: 11/09/2022 19:09   DG Chest Portable 1 View  Result Date: 11/09/2022 CLINICAL DATA:  Presyncope, dyspnea EXAM: PORTABLE CHEST 1 VIEW COMPARISON:  05/25/2018 FINDINGS: The heart size and mediastinal contours are within normal limits. Both lungs are clear. The visualized skeletal structures are unremarkable. Surgical clips again noted at the neck base bilaterally. IMPRESSION: No active disease. Electronically Signed   By: Fidela Salisbury M.D.   On: 11/09/2022 17:47       LOS: 1 day   [facility] Pager on [email or web address]  11/11/2022, 9:50 AM

## 2022-11-11 NOTE — Plan of Care (Signed)
  Problem: Education: Goal: Knowledge of disease or condition will improve Outcome: Progressing   Problem: Health Behavior/Discharge Planning: Goal: Ability to manage health-related needs will improve Outcome: Progressing   Problem: Self-Care: Goal: Ability to communicate needs accurately will improve Outcome: Progressing

## 2022-11-12 DIAGNOSIS — I10 Essential (primary) hypertension: Secondary | ICD-10-CM | POA: Diagnosis not present

## 2022-11-12 DIAGNOSIS — E785 Hyperlipidemia, unspecified: Secondary | ICD-10-CM | POA: Diagnosis not present

## 2022-11-12 DIAGNOSIS — F1410 Cocaine abuse, uncomplicated: Secondary | ICD-10-CM | POA: Diagnosis not present

## 2022-11-12 DIAGNOSIS — I639 Cerebral infarction, unspecified: Secondary | ICD-10-CM | POA: Diagnosis not present

## 2022-11-12 LAB — BASIC METABOLIC PANEL
Anion gap: 10 (ref 5–15)
BUN: 16 mg/dL (ref 6–20)
CO2: 21 mmol/L — ABNORMAL LOW (ref 22–32)
Calcium: 8.7 mg/dL — ABNORMAL LOW (ref 8.9–10.3)
Chloride: 108 mmol/L (ref 98–111)
Creatinine, Ser: 0.82 mg/dL (ref 0.44–1.00)
GFR, Estimated: >60 mL/min (ref >60)
Glucose, Bld: 104 mg/dL — ABNORMAL HIGH (ref 70–99)
Potassium: 4 mmol/L (ref 3.5–5.1)
Sodium: 139 mmol/L (ref 135–145)

## 2022-11-12 LAB — HEMOGLOBIN A1C
Hgb A1c MFr Bld: 6 % — ABNORMAL HIGH (ref 4.8–5.6)
Mean Plasma Glucose: 126 mg/dL

## 2022-11-12 LAB — MAGNESIUM: Magnesium: 1.8 mg/dL (ref 1.7–2.4)

## 2022-11-12 MED ORDER — GUAIFENESIN 100 MG/5ML PO LIQD
5.0000 mL | ORAL | Status: DC | PRN
  Administered 2022-11-12 – 2022-11-13 (×2): 5 mL via ORAL
  Filled 2022-11-12 (×2): qty 5

## 2022-11-12 MED ORDER — OYSTER SHELL CALCIUM/D3 500-5 MG-MCG PO TABS
1.0000 | ORAL_TABLET | Freq: Every day | ORAL | Status: DC
  Administered 2022-11-13 – 2022-11-14 (×2): 1 via ORAL
  Filled 2022-11-12 (×2): qty 1

## 2022-11-12 NOTE — Progress Notes (Signed)
Physical Therapy Treatment Patient Details Name: Theresa Watkins MRN: [id number] DOB: Nov 03, 1966 Today's Date: 11/12/2022   History of Present Illness Patient is a 56 y/o female who presents on 12/22 with dizziness, left sided weakness/numbness and diploplia.  + cocaine. 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 PMH includes CVA, HTN, thyroid cancer, cocaine use, seizures, UTI.    PT Comments    Patient moves impulsively throughout session (trying to get OOB and standing prior to PT instructions, sudden change in vision with sudden need to sit down, sudden increased weakness LLE and need to sit down). Patient reported vision and weakness symptoms subsided once seated. Overall requires mod assist and close chair follow to ambulate 15 ft at most. Several seated rest breaks. Ultimately, left foot began supinating in stance and deferred additional gait due to risk of ankle injury.     Recommendations for follow up therapy are one component of a multi-disciplinary discharge planning process, led by the attending physician.  Recommendations may be updated based on patient status, additional functional criteria and insurance authorization.  Follow Up Recommendations  Skilled nursing-short term rehab (<3 hours/day) Can patient physically be transported by private vehicle: Yes   Assistance Recommended at Discharge Frequent or constant Supervision/Assistance  Patient can return home with the following A little help with walking and/or transfers;A little help with bathing/dressing/bathroom;Assistance with cooking/housework;Direct supervision/assist for financial management;Assist for transportation;Help with stairs or ramp for entrance;Direct supervision/assist for medications management   Equipment Recommendations  Rolling walker (2 wheels)    Recommendations for Other Services       Precautions / Restrictions Precautions Precautions: Fall;Other (comment) Precaution Comments: visual  deficits Restrictions Weight Bearing Restrictions: No     Mobility  Bed Mobility Overal bed mobility: Modified Independent             General bed mobility comments: needs increased time    Transfers Overall transfer level: Needs assistance Equipment used: Rolling walker (2 wheels) Transfers: Sit to/from Stand Sit to Stand: Min assist           General transfer comment: vc for hand placement; steadying assist as comes up to stand    Ambulation/Gait Ambulation/Gait assistance: +2 safety/equipment, Mod assist Gait Distance (Feet): 15 Feet (seated rest break, 15 seated rest, 5 ft) Assistive device: Rolling walker (2 wheels) Gait Pattern/deviations: Step-through pattern, Decreased stride length, Decreased step length - right, Decreased step length - left, Narrow base of support, Staggering left Gait velocity: decreased     General Gait Details: SLow, unsteady gait with narrow boS and leaning left, (3 instances of sudden LOB to her left and required mod assist to correct); as fatigued her left foot began to supinate and further gait deferred   Stairs             Wheelchair Mobility    Modified Rankin (Stroke Patients Only) Modified Rankin (Stroke Patients Only) Pre-Morbid Rankin Score: Slight disability Modified Rankin: Moderately severe disability     Balance Overall balance assessment: Needs assistance Sitting-balance support: Bilateral upper extremity supported, Feet supported Sitting balance-Leahy Scale: Fair     Standing balance support: Bilateral upper extremity supported, During functional activity, Reliant on assistive device for balance Standing balance-Leahy Scale: Poor Standing balance comment: Leans left with narrow BoS, needs UE support and external support                            Cognition Arousal/Alertness: Awake/alert Behavior During  Therapy: WFL for tasks assessed/performed Overall Cognitive Status: Impaired/Different  from baseline Area of Impairment: Attention, Memory, Following commands, Safety/judgement, Awareness, Problem solving                   Current Attention Level: Sustained Memory: Decreased recall of precautions, Decreased short-term memory Following Commands: Follows one step commands inconsistently, Follows one step commands with increased time Safety/Judgement: Decreased awareness of safety, Decreased awareness of deficits Awareness: Intellectual Problem Solving: Slow processing General Comments: pt impulsively climbing OOB and around bed rail when told we were here to help her get up and walk;        Exercises      General Comments        Pertinent Vitals/Pain Pain Assessment Pain Assessment: No/denies pain    Home Living                          Prior Function            PT Goals (current goals can now be found in the care plan section) Acute Rehab PT Goals Patient Stated Goal: to get better, decrease headache Time For Goal Achievement: 11/24/22 Potential to Achieve Goals: Fair Progress towards PT goals: Progressing toward goals    Frequency    Min 3X/week      PT Plan Current plan remains appropriate    Co-evaluation              AM-PAC PT "6 Clicks" Mobility   Outcome Measure  Help needed turning from your back to your side while in a flat bed without using bedrails?: None Help needed moving from lying on your back to sitting on the side of a flat bed without using bedrails?: None Help needed moving to and from a bed to a chair (including a wheelchair)?: A Little Help needed standing up from a chair using your arms (e.g., wheelchair or bedside chair)?: A Little Help needed to walk in hospital room?: Total Help needed climbing 3-5 steps with a railing? : Total 6 Click Score: 16    End of Session Equipment Utilized During Treatment: Gait belt Activity Tolerance: Patient tolerated treatment well Patient left: with call  bell/phone within reach;in chair;with chair alarm set Nurse Communication: Mobility status;Other (comment) (pt's bed is wet and she wants it changed) PT Visit Diagnosis: Pain;Unsteadiness on feet (R26.81);Muscle weakness (generalized) (M62.81);Difficulty in walking, not elsewhere classified (R26.2)     Time: 2417-5301 PT Time Calculation (min) (ACUTE ONLY): 20 min  Charges:  $Gait Training: 8-22 mins                      Arby Barrette, PT [place]  Office [phone]    Rexanne Mano 11/12/2022, 2:57 PM

## 2022-11-12 NOTE — Progress Notes (Signed)
[facility] PROGRESS NOTE   Theresa Watkins WPY:[id number] DOB: 24-Apr-1966 DOA: 11/08/2022  PCP: Theresa Pier, MD  Brief History/Interval Summary: 56 y.o. female with medical history significant for prior stroke in 2017, ongoing cocaine abuse, ongoing smoking, HTN and HLD not regularly taking her meds, former EtOH abuse though not any more per pt. patient presented with lightheadedness generalized weakness especially weakness in the left arm and leg.  Workup revealed acute stroke.  She was hospitalized for further management.  Consultants: Neurology  Procedures: Echocardiogram    Subjective/Interval History: Patient continues to have left-sided weakness and blurry vision.  She lives by herself.  Denies any nausea or vomiting.  No chest pain or shortness of breath.    Assessment/Plan:  Acute ischemic stroke Patient with left-sided weakness and visual impairments. Patient underwent MRI brain, MRA head and neck. Seen by neurology. Aspirin and Plavix recommended for 3 weeks followed by Plavix alone.  Patient counseled to stop doing cocaine. LDL noted to be 152.  Patient started on rosuvastatin. HbA1c is 6.0. PT OT has seen and SNF is recommended for rehabilitation. Echocardiogram showed normal systolic function.  Trivial MR noted.  No other valvular abnormalities appreciated.    Cocaine abuse Patient counseled.  Essential hypertension Permissive hypertension being allowed.  Amlodipine on hold.  Blood pressure is reasonably well-controlled. Occasional high readings noted.  Should be able to resume amlodipine at discharge.  Hyperlipidemia LDL 152. Was on pravastatin but likely noncompliant.  Changed over to rosuvastatin.  Hypothyroidism Continue levothyroxine.  History of alcohol abuse This is apparently a previous history.  Not drinking any importer as per patient.  Tobacco abuse Counseled.   DVT Prophylaxis: Lovenox Code Status: Full code Family  Communication: Discussed with patient Disposition Plan: SNF.  TOC consulted.  Status is: Inpatient Remains inpatient appropriate because: Acute stroke     Medications: Scheduled:  aspirin EC  81 mg Oral Daily   clopidogrel  75 mg Oral Daily   enoxaparin (LOVENOX) injection  40 mg Subcutaneous Q24H   levothyroxine  125 mcg Oral QAC breakfast   rosuvastatin  40 mg Oral Daily   Continuous: KNL:[id number] **OR** acetaminophen (TYLENOL) oral liquid 160 mg/5 mL **OR** acetaminophen, LORazepam, LORazepam  Antibiotics: Anti-infectives (From admission, onward)    None       Objective:  Vital Signs  Vitals:   11/11/22 2341 11/12/22 0415 11/12/22 0719 11/12/22 0749  BP: (!) 157/88 (!) 126/92 133/77   Pulse: 70 74 67   Resp: '18 18 18   '$ Temp: 98.7 F (37.1 C) 98 F (36.7 C) 98.1 F (36.7 C)   TempSrc: Oral Axillary Oral   SpO2: 99% 96% 98%   Weight:      Height:    '5\' 5"'$  (1.651 m)    Intake/Output Summary (Last 24 hours) at 11/12/2022 0934 Last data filed at 11/12/2022 0434 Gross per 24 hour  Intake --  Output 700 ml  Net -700 ml    Filed Weights   11/08/22 1825  Weight: 72.6 kg    General appearance: Awake alert.  In no distress Resp: Clear to auscultation bilaterally.  Normal effort Cardio: S1-S2 is normal regular.  Systolic murmur appreciated over the precordium. GI: Abdomen is soft.  Nontender nondistended.  Bowel sounds are present normal.  No masses organomegaly Extremities: No edema.  Full range of motion of lower extremities. Neurologic: Alert and oriented x3.  Left-sided weakness is noted    Lab Results:  Data Reviewed: I have personally  reviewed following labs and reports of the imaging studies  CBC: Recent Labs  Lab 11/08/22 1849 11/11/22 0258  WBC 5.6 5.7  NEUTROABS 3.4  --   HGB 16.2* 15.1*  HCT 49.1* 45.4  MCV 86.0 86.6  PLT 196 173     Basic Metabolic Panel: Recent Labs  Lab 11/08/22 1849 11/09/22 1718 11/11/22 0258  11/12/22 0506  NA 142  --  139 139  K 3.8  --  3.5 4.0  CL 105  --  108 108  CO2 25  --  20* 21*  GLUCOSE 102*  --  107* 104*  BUN 11  --  14 16  CREATININE 0.90  --  0.82 0.82  CALCIUM 8.5*  --  8.4* 8.7*  MG  --  1.9  --  1.8     GFR: Estimated Creatinine Clearance: 75.5 mL/min (by C-G formula based on SCr of 0.82 mg/dL).  Liver Function Tests: Recent Labs  Lab 11/08/22 1849  AST 15  ALT 14  ALKPHOS 55  BILITOT 0.8  PROT 6.7  ALBUMIN 4.1      Lipid Profile: Recent Labs    11/10/22 0321  CHOL 223*  HDL 51  LDLCALC 152*  TRIG 100  CHOLHDL 4.4     Radiology Studies: ECHOCARDIOGRAM COMPLETE  Result Date: 11/10/2022    ECHOCARDIOGRAM REPORT   Patient Name:   Theresa Watkins Date of Exam: 11/10/2022 Medical Rec #:  [id number]         Height:       65.0 in Accession #:    [id number]        Weight:       160.0 lb Date of Birth:  28-Apr-1966         BSA:          1.799 m Patient Age:    72 years          BP:           142/77 mmHg Patient Gender: F                 HR:           65 bpm. Exam Location:  Inpatient Procedure: 2D Echo, Cardiac Doppler and Color Doppler Indications:    I63.9 Stroke  History:        Patient has prior history of Echocardiogram examinations, most                 recent 12/25/2015. Risk Factors:Hypertension, Dyslipidemia and                 Current Smoker. ETOH and Cocaine abuse.  Sonographer:    Theresa Watkins RCS Referring Phys: [place]  1. Left ventricular ejection fraction, by estimation, is 60 to 65%. The left ventricle has normal function. The left ventricle has no regional wall motion abnormalities. There is moderate left ventricular hypertrophy. Left ventricular diastolic parameters are indeterminate.  2. Right ventricular systolic function is normal. The right ventricular size is normal. There is normal pulmonary artery systolic pressure.  3. Mobile atrial septal aneursym no obvious PFO/shunt.  4. The mitral valve is  abnormal. Trivial mitral valve regurgitation. No evidence of mitral stenosis.  5. The aortic valve is tricuspid. Aortic valve regurgitation is not visualized. No aortic stenosis is present.  6. The inferior vena cava is normal in size with greater than 50% respiratory variability, suggesting right atrial pressure of 3 mmHg. FINDINGS  Left Ventricle:  Left ventricular ejection fraction, by estimation, is 60 to 65%. The left ventricle has normal function. The left ventricle has no regional wall motion abnormalities. The left ventricular internal cavity size was normal in size. There is  moderate left ventricular hypertrophy. Left ventricular diastolic parameters are indeterminate. Right Ventricle: The right ventricular size is normal. No increase in right ventricular wall thickness. Right ventricular systolic function is normal. There is normal pulmonary artery systolic pressure. The tricuspid regurgitant velocity is 1.88 m/s, and  with an assumed right atrial pressure of 3 mmHg, the estimated right ventricular systolic pressure is 66.4 mmHg. Left Atrium: Left atrial size was normal in size. Right Atrium: Right atrial size was normal in size. Pericardium: There is no evidence of pericardial effusion. Mitral Valve: The mitral valve is abnormal. There is mild thickening of the mitral valve leaflet(s). Trivial mitral valve regurgitation. No evidence of mitral valve stenosis. Tricuspid Valve: The tricuspid valve is normal in structure. Tricuspid valve regurgitation is trivial. No evidence of tricuspid stenosis. Aortic Valve: The aortic valve is tricuspid. Aortic valve regurgitation is not visualized. No aortic stenosis is present. Pulmonic Valve: The pulmonic valve was normal in structure. Pulmonic valve regurgitation is not visualized. No evidence of pulmonic stenosis. Aorta: The aortic root is normal in size and structure. Venous: The inferior vena cava is normal in size with greater than 50% respiratory variability,  suggesting right atrial pressure of 3 mmHg. IAS/Shunts: No atrial level shunt detected by color flow Doppler.  LEFT VENTRICLE PLAX 2D LVIDd:         4.20 cm   Diastology LVIDs:         2.70 cm   LV e' medial:    5.22 cm/s LV PW:         1.40 cm   LV E/e' medial:  11.0 LV IVS:        1.30 cm   LV e' lateral:   6.96 cm/s LVOT diam:     2.10 cm   LV E/e' lateral: 8.2 LV SV:         64 LV SV Index:   35 LVOT Area:     3.46 cm  RIGHT VENTRICLE RV S prime:     12.70 cm/s TAPSE (M-mode): 1.7 cm LEFT ATRIUM             Index        RIGHT ATRIUM           Index LA diam:        3.40 cm 1.89 cm/m   RA Area:     14.10 cm LA Vol (A2C):   49.4 ml 27.46 ml/m  RA Volume:   35.20 ml  19.56 ml/m LA Vol (A4C):   43.5 ml 24.18 ml/m LA Biplane Vol: 49.3 ml 27.40 ml/m  AORTIC VALVE LVOT Vmax:   85.50 cm/s LVOT Vmean:  55.700 cm/s LVOT VTI:    0.184 m  AORTA Ao Root diam: 3.20 cm MITRAL VALVE               TRICUSPID VALVE MV Area (PHT): 2.62 cm    TR Peak grad:   14.1 mmHg MV Decel Time: 289 msec    TR Vmax:        188.00 cm/s MV E velocity: 57.20 cm/s MV A velocity: 70.30 cm/s  SHUNTS MV E/A ratio:  0.81        Systemic VTI:  0.18 m  Systemic Diam: 2.10 cm Jenkins Rouge MD Electronically signed by Jenkins Rouge MD Signature Date/Time: 11/10/2022/11:08:52 AM    Final        LOS: 2 days   [facility] Pager on [email or web address]  11/12/2022, 9:34 AM

## 2022-11-12 NOTE — TOC Initial Note (Addendum)
Transition of Care [facility]) - Initial/Assessment Note    Patient Details  Name: Theresa Watkins MRN: [id number] Date of Birth: 03-07-1966  Transition of Care [facility]) CM/SW Contact:    Jinger Neighbors, LCSW Phone Number: 11/12/2022, 12:49 PM  Clinical Narrative:                 CSW met with pt at bedside to complete assessment. Pt aox4 and engaged well with CSW. Pt minimized SU, but believes limited use of crack/cocaine caused most recent stroke, but states she has now "quit". CSW informed her of PT recommendations for SNF and she is on board; however, pt may not get a bed due to documented SU. CSW will complete work up and fax out.  Work up and fax out complete; need PASSR  Expected Discharge Plan: Skilled Nursing Facility Barriers to Discharge: SNF Pending bed offer   Patient Goals and CMS Choice Patient states their goals for this hospitalization and ongoing recovery are:: "Good mobility" CMS Medicare.gov Compare Post Acute Care list provided to:: Patient Choice offered to / list presented to : Patient      Expected Discharge Plan and Services       Living arrangements for the past 2 months: Apartment                                      Prior Living Arrangements/Services Living arrangements for the past 2 months: Apartment Lives with:: Self Patient language and need for interpreter reviewed:: Yes Do you feel safe going back to the place where you live?: Yes        Care giver support system in place?: Yes (comment)   Criminal Activity/Legal Involvement Pertinent to Current Situation/Hospitalization: No - Comment as needed  Activities of Daily Living Home Assistive Devices/Equipment: None ADL Screening (condition at time of admission) Patient's cognitive ability adequate to safely complete daily activities?: Yes Is the patient deaf or have difficulty hearing?: No Does the patient have difficulty seeing, even when wearing glasses/contacts?: No Does the patient have  difficulty concentrating, remembering, or making decisions?: No Patient able to express need for assistance with ADLs?: Yes Does the patient have difficulty dressing or bathing?: No Independently performs ADLs?: Yes (appropriate for developmental age) Does the patient have difficulty walking or climbing stairs?: No Weakness of Legs: Left Weakness of Arms/Hands: Left  Permission Sought/Granted                  Emotional Assessment Appearance:: Appears stated age Attitude/Demeanor/Rapport: Gracious Affect (typically observed): Accepting, Adaptable Orientation: : Oriented to Self, Oriented to Place, Oriented to  Time, Oriented to Situation Alcohol / Substance Use: Tobacco Use, Illicit Drugs Psych Involvement: No (comment)  Admission diagnosis:  Acute ischemic stroke [facility]) [I63.9] Cerebrovascular accident (CVA), unspecified mechanism ([place]) [I63.9] Acute CVA (cerebrovascular accident) [facility]) [I63.9] Patient Active Problem List   Diagnosis Date Noted   Acute CVA (cerebrovascular accident) ([place]) 11/10/2022   Acute ischemic stroke ([place]) 11/09/2022   Chondromalacia patellae, 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   Unilateral primary osteoarthritis, left hip 09/25/2021   Left foot pain 08/26/2019   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   Tobacco dependence 04/09/2019   Vitamin D deficiency 02/18/2018   Toe deformity, acquired, right 02/18/2018   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   Hyperlipemia 03/21/2017   Headache 03/21/2017   Tooth pain    CVA (cerebral infarction) 12/27/2015  HLD (hyperlipidemia)    Hemiparesis affecting dominant side as late effect of stroke ([place])    Tobacco abuse    Cocaine abuse ([place])    ETOH abuse    Postoperative hypothyroidism 12/24/2015   Drug abuse, cocaine type ([place]) 05/09/2014   Hx of thyroid cancer 01/25/2014   Essential hypertension 06/03/2012   PCP:  Ladell Pier, MD Pharmacy:   [place], [place] [address] Phone: [phone] Fax: [place] [address] Phone: [phone] Fax: [phone]  [facility] [address] [place] [address] Phone: [phone] Fax: [phone]     Social Determinants of Health (SDOH) Social History: [place]: No Food Insecurity (11/10/2022)  Housing: Low Risk  (11/10/2022)  Transportation Needs: No Transportation Needs (11/10/2022)  Utilities: Not At Risk (11/10/2022)  Depression (PHQ2-9): Medium Risk (09/19/2022)  Tobacco Use: High Risk (11/08/2022)   SDOH Interventions:     Readmission Risk Interventions     No data to display

## 2022-11-12 NOTE — [place] FL2 (Signed)
[place] MEDICAID FL2 LEVEL OF CARE FORM     IDENTIFICATION  Patient Name: Theresa Watkins Birthdate: [birth year] Sex: female Admission Date (Current Location): 11/08/2022  [facility] [place] and [place] Number:  Herbalist and Address:  [employer], [place] [address] [place], [place] [address]      Provider Number: [id number]  Attending Physician Name and Address:  Bonnielee Haff, MD  Relative Name and Phone Number:  N/A    Current Level of Care: Hospital Recommended Level of Care: [place] Prior Approval Number:    Date Approved/Denied:   PASRR Number:    Discharge Plan: SNF    Current Diagnoses: Patient Active Problem List   Diagnosis Date Noted   Acute CVA (cerebrovascular accident) ([place]) 11/10/2022   Acute ischemic stroke ([place]) 11/09/2022   Chondromalacia patellae, 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   Unilateral primary osteoarthritis, left hip 09/25/2021   Left foot pain 08/26/2019   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   Tobacco dependence 04/09/2019   Vitamin D deficiency 02/18/2018   Toe deformity, acquired, right 02/18/2018   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   Hyperlipemia 03/21/2017   Headache 03/21/2017   Tooth pain    CVA (cerebral infarction) 12/27/2015   HLD (hyperlipidemia)    Hemiparesis affecting dominant side as late effect of stroke ([place])    Tobacco abuse    Cocaine abuse ([place])    ETOH abuse    Postoperative hypothyroidism 12/24/2015   Drug abuse, cocaine type ([place]) 05/09/2014   Hx of thyroid cancer 01/25/2014   Essential hypertension 06/03/2012    Orientation RESPIRATION BLADDER Height & Weight     Self, Time, Situation, Place  Normal Incontinent, External catheter Weight: 160 lb (72.6 kg) Height:  '5\' 5"'$  (165.1 cm)  BEHAVIORAL SYMPTOMS/MOOD NEUROLOGICAL BOWEL NUTRITION STATUS      Continent Diet (see d/c summary)  AMBULATORY STATUS COMMUNICATION OF NEEDS Skin   Limited  Assist Verbally Normal                       Personal Care Assistance Level of Assistance  Bathing, Feeding, Dressing Bathing Assistance: Limited assistance Feeding assistance: Limited assistance Dressing Assistance: Limited assistance     Functional Limitations Info  Speech, Hearing, Sight Sight Info: Adequate Hearing Info: Adequate Speech Info: Adequate    SPECIAL CARE FACTORS FREQUENCY  PT (By licensed PT), OT (By licensed OT)     PT Frequency: 5x/wk OT Frequency: 5x/wk            Contractures Contractures Info: Not present    Additional Factors Info  Code Status, Allergies Code Status Info: Full Allergies Info: Allergies     Gadolinium Derivatives   Hives, Itching, Swelling  High  Allergy  03/28/2018  After MultiHance (gadolinium) injection, pt began sneezing.  After exam, pt told MR tech Burna Mortimer that she was itching.  Dr. Owens Shark evaluated pt, and noticed hives on pt's back.  Pt said that she felt swelling in her throat.  Dr. Owens Shark directed that pt be taken to hospital via ambulance.    Iohexol   Hives, Itching, Other (See Comments)  High  Allergy  07/18/2009   Code: HIVES, Desc: pts tongue began itching post injection and throat burning, Onset Date: [id number]  Proanthocyanidin   Swelling  High  05/15/2012  Swelling of the tongue  Grapeseed Extract (Nutritional Supplements)   Not Specified  11/06/2017  Tongue swelll  Diphenhydramine Hcl  Rash  Low  03/23/2012  Tramadol   Rash           Current Medications (11/12/2022):  This is the current hospital active medication list Current Facility-Administered Medications  Medication Dose Route Frequency Provider Last Rate Last Admin   acetaminophen (TYLENOL) tablet 650 mg  650 mg Oral Q4H PRN Etta Quill, DO   650 mg at 11/12/22 0422   Or   acetaminophen (TYLENOL) 160 MG/5ML solution 650 mg  650 mg Per Tube Q4H PRN Etta Quill, DO       Or   acetaminophen (TYLENOL) suppository 650 mg  650 mg Rectal Q4H PRN Etta Quill, DO       aspirin EC tablet 81 mg  81 mg Oral Daily Jennette Kettle M, DO   81 mg at 11/12/22 8889   clopidogrel (PLAVIX) tablet 75 mg  75 mg Oral Daily Etta Quill, DO   75 mg at 11/12/22 1694   enoxaparin (LOVENOX) injection 40 mg  40 mg Subcutaneous Q24H Jennette Kettle M, DO   40 mg at 11/12/22 0813   levothyroxine (SYNTHROID) tablet 125 mcg  125 mcg Oral QAC breakfast Etta Quill, DO   125 mcg at 11/12/22 5038   LORazepam (ATIVAN) injection 1 mg  1 mg Intravenous PRN Regan Lemming, MD       LORazepam (ATIVAN) tablet 0.5 mg  0.5 mg Oral PRN Regan Lemming, MD       rosuvastatin (CRESTOR) tablet 40 mg  40 mg Oral Daily Rosalin Hawking, MD   40 mg at 11/12/22 8828     Discharge Medications: Please see discharge summary for a list of discharge medications.  Relevant Imaging Results:  Relevant Lab Results:   Additional Information SS#: [id number]  Jinger Neighbors, LCSW

## 2022-11-12 NOTE — Plan of Care (Signed)
  Problem: Education: Goal: Knowledge of disease or condition will improve Outcome: Progressing   Problem: Coping: Goal: Will verbalize positive feelings about self Outcome: Progressing   Problem: Health Behavior/Discharge Planning: Goal: Ability to manage health-related needs will improve Outcome: Progressing   Problem: Self-Care: Goal: Ability to participate in self-care as condition permits will improve Outcome: Progressing   Problem: Nutrition: Goal: Risk of aspiration will decrease Outcome: Progressing Goal: Dietary intake will improve Outcome: Progressing

## 2022-11-13 DIAGNOSIS — I639 Cerebral infarction, unspecified: Secondary | ICD-10-CM | POA: Diagnosis not present

## 2022-11-13 NOTE — Progress Notes (Signed)
[facility] PROGRESS NOTE   Theresa Watkins IWP:[id number] DOB: 04-17-1966 DOA: 11/08/2022  PCP: Ladell Pier, MD  Brief History/Interval Summary: 56 y.o. female with medical history significant for prior stroke in 2017, ongoing cocaine abuse, ongoing smoking, HTN and HLD not regularly taking her meds, former EtOH abuse though not any more per pt. patient presented with lightheadedness generalized weakness especially weakness in the left arm and leg.  Workup revealed acute stroke.  She was hospitalized for further management.  Consultants: Neurology  Procedures: Echocardiogram  Subjective/Interval History: Patient continues to have difficulty ambulating, otherwise denies nausea vomiting diarrhea constipation fevers chills or chest pain  Assessment/Plan:  Acute ischemic stroke Patient with left-sided weakness and visual impairments. MRI confirmed acute CVA, MRI negative for large occlusion Neurology following, aspirin Plavix x 3 weeks followed by Plavix alone Lengthy discussion at bedside about discontinuation of illicit substances including cocaine Continue statin given elevated LDL PT OT has seen and SNF is recommended for rehabilitation. Echocardiogram showed normal systolic function.  Trivial MR noted.  No other valvular abnormalities appreciated.    Cocaine abuse Patient counseled at length.  Essential hypertension Blood pressure currently well-controlled off medications, consider discontinuing amlodipine at discharge  Hyperlipidemia Noncompliant with pravastatin, continue rosuvastatin  Hypothyroidism Continue levothyroxine.  History of alcohol abuse This is apparently a previous history.  Not drinking any importer as per patient.  Tobacco abuse Counseled.  DVT Prophylaxis: Lovenox Code Status: Full code Family Communication: Discussed with patient Disposition Plan: SNF.  Patient continues to have difficulty ambulating due to balance  issues.  Status is: Inpatient Remains inpatient appropriate because: Acute stroke  Medications: Scheduled:  aspirin EC  81 mg Oral Daily   calcium-vitamin D  1 tablet Oral Q breakfast   clopidogrel  75 mg Oral Daily   enoxaparin (LOVENOX) injection  40 mg Subcutaneous Q24H   levothyroxine  125 mcg Oral QAC breakfast   rosuvastatin  40 mg Oral Daily   Continuous: [id number] **OR** acetaminophen (TYLENOL) oral liquid 160 mg/5 mL **OR** acetaminophen, guaiFENesin, LORazepam, LORazepam  Antibiotics: Anti-infectives (From admission, onward)    None       Objective:  Vital Signs  Vitals:   11/12/22 1519 11/12/22 2005 11/12/22 2321 11/13/22 0317  BP: (!) 154/80 117/80 (!) 147/84 (!) 100/57  Pulse: 69 76 83 75  Resp: '16 16 16 18  '$ Temp: 97.9 F (36.6 C) 98.2 F (36.8 C) 98 F (36.7 C) 98.6 F (37 C)  TempSrc: Oral Oral Oral   SpO2: 100% 99% 100% 97%  Weight:      Height:        Intake/Output Summary (Last 24 hours) at 11/13/2022 0745 Last data filed at 11/13/2022 0500 Gross per 24 hour  Intake 480 ml  Output 600 ml  Net -120 ml    Filed Weights   11/08/22 1825  Weight: 72.6 kg    General appearance: Awake alert.  In no distress Resp: Clear to auscultation bilaterally.  Normal effort Cardio: S1-S2 is normal regular.  Systolic murmur appreciated over the precordium. GI: Abdomen is soft.  Nontender nondistended.  Bowel sounds are present normal.  No masses organomegaly Extremities: No edema.  Full range of motion of lower extremities. LUE weakness noted. Neurologic: Alert and oriented x3.  Left-sided weakness is noted  Lab Results:  Data Reviewed: I have personally reviewed following labs and reports of the imaging studies CBC: Recent Labs  Lab 11/08/22 1849 11/11/22 0258  WBC 5.6 5.7  NEUTROABS 3.4  --  HGB 16.2* 15.1*  HCT 49.1* 45.4  MCV 86.0 86.6  PLT 196 188   Basic Metabolic Panel: Recent Labs  Lab 11/08/22 1849 11/09/22 1718  11/11/22 0258 11/12/22 0506  NA 142  --  139 139  K 3.8  --  3.5 4.0  CL 105  --  108 108  CO2 25  --  20* 21*  GLUCOSE 102*  --  107* 104*  BUN 11  --  14 16  CREATININE 0.90  --  0.82 0.82  CALCIUM 8.5*  --  8.4* 8.7*  MG  --  1.9  --  1.8     GFR: Estimated Creatinine Clearance: 75.5 mL/min (by C-G formula based on SCr of 0.82 mg/dL).  Liver Function Tests: Recent Labs  Lab 11/08/22 1849  AST 15  ALT 14  ALKPHOS 55  BILITOT 0.8  PROT 6.7  ALBUMIN 4.1      Lipid Profile: No results for input(s): "CHOL", "HDL", "LDLCALC", "TRIG", "CHOLHDL", "LDLDIRECT" in the last 72 hours.   Radiology Studies: No results found.     LOS: 3 days   [facility] Pager on [email or web address]  11/13/2022, 7:45 AM

## 2022-11-13 NOTE — Progress Notes (Signed)
Occupational Therapy Treatment Patient Details Name: Theresa Watkins MRN: [id number] DOB: [birth year] Today's Date: 11/13/2022   History of present illness Patient is a 56 y/o female who presents on 12/22 with dizziness, left sided weakness/numbness and diploplia.  + cocaine. 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 PMH includes CVA, HTN, thyroid cancer, cocaine use, seizures, UTI.   OT comments  Pt progressing towards established OT goals. Performing LB Adl with min guard A seated EOB. Pt continues to endorse vision difficulties reporting that she sees three images out of her L eye even with R eye occluded, and that vision from R eye is blurry. Some inconsistencies in ability and pt report. Pt endorses dizziness due to visual changes as well. Pt with decreased safety and awareness with functional mobility requiring +2 for safety and mod-max cues for safe use of RW. Pt with moderate inappropriate comments and actions towards mobility specialist present in session. Pt with decreased balance, vision, safety, awareness, coordination, and strength. Continue to recommend SNF for discharge due to high fall risk and decreased safety awareness.    Recommendations for follow up therapy are one component of a multi-disciplinary discharge planning process, led by the attending physician.  Recommendations may be updated based on patient status, additional functional criteria and insurance authorization.    Follow Up Recommendations  Skilled nursing-short term rehab (<3 hours/day)     Assistance Recommended at Discharge Intermittent Supervision/Assistance  Patient can return home with the following  Two people to help with walking and/or transfers;A lot of help with bathing/dressing/bathroom   Equipment Recommendations  None recommended by OT    Recommendations for Other Services      Precautions / Restrictions Precautions Precautions: Fall;Other (comment) Precaution Comments: visual  deficits Restrictions Weight Bearing Restrictions: No       Mobility Bed Mobility Overal bed mobility: Modified Independent             General bed mobility comments: needs increased time    Transfers Overall transfer level: Needs assistance Equipment used: Rolling walker (2 wheels) Transfers: Sit to/from Stand Sit to Stand: Min assist           General transfer comment: vc for hand placement; steadying assist as comes up to stand     Balance Overall balance assessment: Needs assistance Sitting-balance support: Bilateral upper extremity supported, Feet supported Sitting balance-Leahy Scale: Fair Sitting balance - Comments: requires bil Hands   Standing balance support: Bilateral upper extremity supported, During functional activity, Reliant on assistive device for balance Standing balance-Leahy Scale: Poor Standing balance comment: Leans left with narrow BoS, needs UE support and external support                           ADL either performed or assessed with clinical judgement   ADL Overall ADL's : Needs assistance/impaired     Grooming: Wash/dry hands;Wash/dry face;Min guard;Standing Grooming Details (indicate cue type and reason): Min guard A; max cues to orient self to sink rather than leaning over counter and reaching toward sink.             Lower Body Dressing: Supervision/safety;Sitting/lateral leans Lower Body Dressing Details (indicate cue type and reason): to don socks with figure 4 position Toilet Transfer: Min guard;Minimal assistance;Rolling walker (2 wheels);Ambulation Toilet Transfer Details (indicate cue type and reason): Min guard A for safety, min A for descent due to impulsivitiy Toileting- Clothing Manipulation and Hygiene: Minimal assistance;Sit to/from stand Toileting -  Clothing Manipulation Details (indicate cue type and reason): LOB upon standing for pericare Pt attempting to self correct, but only reaching out to RW with  one hand, and walker tipping over     Functional mobility during ADLs: Min guard;+2 for safety/equipment General ADL Comments: +2 for safety during functional mobility due to pt is unpredictable. One LOB requriing min A for correction in restroom and one LOB turning away from sink requiring mod A    Extremity/Trunk Assessment              Vision   Vision Assessment?: Yes Eye Alignment: Impaired (comment) Ocular Range of Motion: Restricted on the right;Impaired-to be further tested in functional context Tracking/Visual Pursuits: Impaired - to be further tested in functional context Saccades: Impaired - to be further tested in functional context Convergence: Impaired - to be further tested in functional context Additional Comments: Pt not using glasses and reporting they do not work even when asked to don in session, continues to report she sees three of everything. Upon further testing, with R eye fully covered by hand, pt reporting she sees three of therapist and mobility specialist. Upon occlusion of L eye, reporting everything is very blurry, could see [place] logo, but reports she could not make out the letters of nearly the same size. pt with overshooting to the L when asked to give therapist a high 5 with LUE, but no over/undershooting when reaching with RUE. No overshooting noted when reaching for toilet paper with LUE. Inconsistent visual report throughout. Pt did endorse dizziness affecting balance, but VSS. Not observed to close eyes and moving head with multiple head turns with no reports of changes during session. Will continue to assess. Of note, pt reporting RN looked like a blob, and at same distance could read room signage and locate tissues.   Perception     Praxis      Cognition Arousal/Alertness: Awake/alert Behavior During Therapy: WFL for tasks assessed/performed Overall Cognitive Status: Impaired/Different from baseline Area of Impairment: Attention, Memory,  Following commands, Safety/judgement, Awareness, Problem solving                   Current Attention Level: Sustained Memory: Decreased recall of precautions, Decreased short-term memory Following Commands: Follows one step commands inconsistently, Follows one step commands with increased time Safety/Judgement: Decreased awareness of safety, Decreased awareness of deficits Awareness: Intellectual Problem Solving: Slow processing General Comments: Pt very impulsive throughout, requiring mod cues for safety and to wait for therapist. Poor understanding of purpose and safe useage of RW, requiring max education and cueing to perform functional mobility safely. Pt with inconsistent reports of vision during session. Reporting she sees three objects stacked on top of one another, but when asked to give a high five, overshooting to the L, however, correct height. Also not closing eyes to decresae experience of double vision at any time. Also limited overshooting/undershooting during functional activity.        Exercises      Shoulder Instructions       General Comments      Pertinent Vitals/ Pain       Pain Assessment Pain Assessment: No/denies pain Pain Intervention(s): Monitored during session  Home Living                                          Prior Functioning/Environment  Frequency  Min 2X/week        Progress Toward Goals  OT Goals(current goals can now be found in the care plan section)  Progress towards OT goals: Progressing toward goals  Acute Rehab OT Goals Patient Stated Goal: take a nap OT Goal Formulation: With patient Time For Goal Achievement: 11/24/22 Potential to Achieve Goals: Good ADL Goals Pt Will Perform Grooming: with modified independence;sitting Pt Will Perform Upper Body Bathing: with modified independence;sitting Pt Will Transfer to Toilet: with min guard assist;regular height  toilet;ambulating Additional ADL Goal #1: pt will demonstrate 1 visual compensatory strategy indep  Plan Discharge plan remains appropriate;Frequency remains appropriate    Co-evaluation                 AM-PAC OT "6 Clicks" Daily Activity     Outcome Measure   Help from another person eating meals?: A Little Help from another person taking care of personal grooming?: A Little Help from another person toileting, which includes using toliet, bedpan, or urinal?: A Lot Help from another person bathing (including washing, rinsing, drying)?: A Lot Help from another person to put on and taking off regular upper body clothing?: A Little Help from another person to put on and taking off regular lower body clothing?: A Little 6 Click Score: 16    End of Session Equipment Utilized During Treatment: Gait belt;Rolling walker (2 wheels)  OT Visit Diagnosis: Unsteadiness on feet (R26.81);Muscle weakness (generalized) (M62.81)   Activity Tolerance Patient tolerated treatment well   Patient Left in bed;with call bell/phone within reach;with bed alarm set   Nurse Communication Mobility status;Precautions        Time: 1779-3903 OT Time Calculation (min): 34 min  Charges: OT General Charges $OT Visit: 1 Visit OT Treatments $Self Care/Home Management : 23-37 mins  Elder Cyphers, OTR/L [facility] Acute Rehabilitation Office: [phone]    Magnus Ivan 11/13/2022, 2:04 PM

## 2022-11-13 NOTE — TOC Progression Note (Signed)
Transition of Care [facility]) - Progression Note    Patient Details  Name: Theresa Watkins MRN: [id number] Date of Birth: 10-05-1966  [facility]) CM/SW Contact  Jinger Neighbors, [place] Phone Number: 11/13/2022, 8:53 AM  Clinical Narrative:     PASSR: [id number]  Expected Discharge Plan: [place] Barriers to Discharge: SNF Pending bed offer  Expected Discharge Plan and Services       Living arrangements for the past 2 months: Apartment                                       Social Determinants of Health (SDOH) Interventions SDOH Screenings   Food Insecurity: No Food Insecurity (11/10/2022)  Housing: Low Risk  (11/10/2022)  Transportation Needs: No Transportation Needs (11/10/2022)  Utilities: Not At Risk (11/10/2022)  Depression (PHQ2-9): Medium Risk (09/19/2022)  Tobacco Use: High Risk (11/08/2022)    Readmission Risk Interventions     No data to display

## 2022-11-14 ENCOUNTER — Other Ambulatory Visit (HOSPITAL_COMMUNITY): Payer: Self-pay

## 2022-11-14 DIAGNOSIS — I639 Cerebral infarction, unspecified: Secondary | ICD-10-CM | POA: Diagnosis not present

## 2022-11-14 MED ORDER — ACETAMINOPHEN 325 MG PO TABS: 650.0000 mg | ORAL_TABLET | Freq: Three times a day (TID) | ORAL | Status: DC | PRN

## 2022-11-14 MED ORDER — ASPIRIN 81 MG PO TBEC
81.0000 mg | DELAYED_RELEASE_TABLET | Freq: Every day | ORAL | 0 refills | Status: DC
  Filled 2022-11-14: qty 18, 18d supply, fill #0

## 2022-11-14 MED ORDER — CLOPIDOGREL BISULFATE 75 MG PO TABS
75.0000 mg | ORAL_TABLET | Freq: Every day | ORAL | 2 refills | Status: DC
  Filled 2022-11-14: qty 30, 30d supply, fill #0

## 2022-11-14 MED ORDER — ROSUVASTATIN CALCIUM 40 MG PO TABS
40.0000 mg | ORAL_TABLET | Freq: Every day | ORAL | 0 refills | Status: DC
  Filled 2022-11-14: qty 30, 30d supply, fill #0

## 2022-11-14 MED ORDER — ACETAMINOPHEN 650 MG RE SUPP: 650.0000 mg | RECTAL | Status: DC | PRN

## 2022-11-14 MED ORDER — ACETAMINOPHEN 160 MG/5ML PO SOLN: 650.0000 mg | ORAL | Status: DC | PRN

## 2022-11-14 NOTE — TOC Transition Note (Signed)
Transition of Care [facility]) - CM/SW Discharge Note   Patient Details  Name: Theresa Watkins MRN: [id number] Date of Birth: Dec 11, 1965  Transition of Care [facility]) CM/SW Contact:  Jinger Neighbors, LCSW Phone Number: 11/14/2022, 2:53 PM   Clinical Narrative:    Pt discharging home with OPT PT/OT and DME to include walker, bedside commode, and shower stool. Pt, her mother, and son Theresa Watkins have been notified via phone of discharge and agreed to pick her up and  transport her home. CSW encouraged natural supports to help oversee patient's medications and compliance with medications.    Final next level of care: Home/Self Care Barriers to Discharge: SNF Pending bed offer   Patient Goals and CMS Choice CMS Medicare.gov Compare Post Acute Care list provided to:: Patient Choice offered to / list presented to : Patient  Discharge Placement                    Name of family member notified: Theresa Watkins- son Patient and family notified of of transfer: 11/14/22  Discharge Plan and Services Additional resources added to the After Visit Summary for                  DME Arranged: Gilford Rile, Shower stool, Bedside commode DME Agency: [employer] Date DME Agency Contacted: 11/14/22   Representative spoke with at DME Agency: [place] ([place]) Interventions [place]: No Food Insecurity (11/10/2022)  Housing: Low Risk  (11/10/2022)  Transportation Needs: No Transportation Needs (11/10/2022)  Utilities: Not At Risk (11/10/2022)  Depression (PHQ2-9): Medium Risk (09/19/2022)  Tobacco Use: High Risk (11/08/2022)     Readmission Risk Interventions     No data to display

## 2022-11-14 NOTE — Progress Notes (Signed)
Physical Therapy Treatment Patient Details Name: Theresa Watkins MRN: [id number] DOB: 03/18/1966 Today's Date: 11/14/2022   History of Present Illness Patient is a 56 y/o female who presents on 12/22 with dizziness, left sided weakness/numbness and diploplia.  + cocaine. 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 PMH includes CVA, HTN, thyroid cancer, cocaine use, seizures, UTI.    PT Comments    Pt greeted seated EOB and eager for session with good progress towards acute goals, however continues to be limited by global weakness, decreased awareness of deficits, impaired vision, decreased activity tolerance and impaired balance/postural reactions. Pt demonstrating increased ambulation tolerance with RW >100', requiring min assist to steady and needing cues for visual focus and environmental awareness and increased assist to wayfind back to room after one turn. Pt grossly supervision to min guard for bed mobility and transfers sit<>stand with light steadying assist needed as pt with slight posterior lean on initial stand, abruptly coming to sit. Current plan remains appropriate to address deficits and maximize functional independence and safety and decrease caregiver burden. Pt continues to benefit from skilled PT services to progress toward functional mobility goals.    Recommendations for follow up therapy are one component of a multi-disciplinary discharge planning process, led by the attending physician.  Recommendations may be updated based on patient status, additional functional criteria and insurance authorization.  Follow Up Recommendations  Skilled nursing-short term rehab (<3 hours/day) (if SNF not option will need max HH services) Can patient physically be transported by private vehicle: Yes   Assistance Recommended at Discharge Frequent or constant Supervision/Assistance  Patient can return home with the following A little help with walking and/or transfers;A little help  with bathing/dressing/bathroom;Assistance with cooking/housework;Direct supervision/assist for financial management;Assist for transportation;Help with stairs or ramp for entrance;Direct supervision/assist for medications management   Equipment Recommendations  Rolling walker (2 wheels)    Recommendations for Other Services       Precautions / Restrictions Precautions Precautions: Fall;Other (comment) Precaution Comments: visual deficits Restrictions Weight Bearing Restrictions: No     Mobility  Bed Mobility Overal bed mobility: Modified Independent             General bed mobility comments: needs increased time    Transfers Overall transfer level: Needs assistance Equipment used: Rolling walker (2 wheels) Transfers: Sit to/from Stand Sit to Stand: Min assist           General transfer comment: vc for hand placement; steadying assist as comes up to stand    Ambulation/Gait Ambulation/Gait assistance: Min assist Gait Distance (Feet): 130 Feet Assistive device: Rolling walker (2 wheels) Gait Pattern/deviations: Step-through pattern, Decreased stride length, Decreased step length - right, Decreased step length - left, Narrow base of support, Staggering left Gait velocity: decreased     General Gait Details: Slow, unsteady gait with narrow BOS, had pt focus on verticals during gait with good return and less c/o dizziness   Stairs             Wheelchair Mobility    Modified Rankin (Stroke Patients Only) Modified Rankin (Stroke Patients Only) Pre-Morbid Rankin Score: Slight disability Modified Rankin: Moderately severe disability     Balance Overall balance assessment: Needs assistance Sitting-balance support: Bilateral upper extremity supported, Feet supported Sitting balance-Leahy Scale: Fair Sitting balance - Comments: requires bil Hands   Standing balance support: Bilateral upper extremity supported, During functional activity, Reliant on  assistive device for balance Standing balance-Leahy Scale: Poor Standing balance comment: reliant on external  support                            Cognition Arousal/Alertness: Awake/alert Behavior During Therapy: WFL for tasks assessed/performed Overall Cognitive Status: Impaired/Different from baseline Area of Impairment: Attention, Memory, Following commands, Safety/judgement, Awareness, Problem solving                 Orientation Level: Disoriented to, Time Current Attention Level: Sustained Memory: Decreased recall of precautions, Decreased short-term memory Following Commands: Follows one step commands inconsistently, Follows one step commands with increased time Safety/Judgement: Decreased awareness of safety, Decreased awareness of deficits Awareness: Intellectual Problem Solving: Slow processing General Comments: Pt very impulsive throughout, requiring mod cues for safety and to wait for therapist.        Exercises      General Comments        Pertinent Vitals/Pain Pain Assessment Pain Assessment: No/denies pain    Home Living                          Prior Function            PT Goals (current goals can now be found in the care plan section) Acute Rehab PT Goals PT Goal Formulation: With patient Time For Goal Achievement: 11/24/22 Progress towards PT goals: Progressing toward goals    Frequency    Min 3X/week      PT Plan      Co-evaluation              AM-PAC PT "6 Clicks" Mobility   Outcome Measure  Help needed turning from your back to your side while in a flat bed without using bedrails?: None Help needed moving from lying on your back to sitting on the side of a flat bed without using bedrails?: None Help needed moving to and from a bed to a chair (including a wheelchair)?: A Little Help needed standing up from a chair using your arms (e.g., wheelchair or bedside chair)?: A Little Help needed to walk in  hospital room?: A Little Help needed climbing 3-5 steps with a railing? : Total 6 Click Score: 18    End of Session Equipment Utilized During Treatment: Gait belt Activity Tolerance: Patient tolerated treatment well Patient left: in bed;with call bell/phone within reach;with bed alarm set Nurse Communication: Mobility status PT Visit Diagnosis: Pain;Unsteadiness on feet (R26.81);Muscle weakness (generalized) (M62.81);Difficulty in walking, not elsewhere classified (R26.2) Pain - part of body:  (head)     Time: 6503-5465 PT Time Calculation (min) (ACUTE ONLY): 23 min  Charges:  $Gait Training: 8-22 mins $Therapeutic Activity: 8-22 mins                     Ziyah Cordoba R. PTA Acute Rehabilitation Services Office: [place] 11/14/2022, 2:28 PM

## 2022-11-14 NOTE — Discharge Summary (Signed)
Physician Discharge Summary  Theresa Watkins LFY:[id number] DOB: 1965-12-27 DOA: 11/08/2022  PCP: Ladell Pier, MD  Admit date: 11/08/2022 Discharge date: 11/14/2022  Admitted From: Home  Disposition: Home  Recommendations for Outpatient Follow-up:  Follow up with PCP in 1-2 weeks Follow-up with neurology as scheduled  Home Health: PT OT Equipment/Devices: Rolling walker, bedside commode, shower chair  Discharge Condition: Stable CODE STATUS: Full Diet recommendation: Low-salt low-fat low-carb diet  Brief/Interim Summary: 56 y.o. female with medical history significant for prior stroke in 2017, ongoing cocaine abuse, ongoing smoking, HTN and HLD not regularly taking her meds, former EtOH abuse though not any more per pt. patient presented with lightheadedness generalized weakness especially weakness in the left arm and leg.  Workup revealed acute stroke.  She was hospitalized for further management.   Patient admitted as above with acute ischemic CVA in the setting of cocaine abuse hypertensive emergency with profound left-sided weakness and visual impairments.  Patient continues to progress with PT OT and speech.  Unfortunately due to patient's illicit substance abuse she has been difficult to place.  At this time she is otherwise stable and agreeable for discharge home with home health PT OT.  We discussed need for ongoing assistance at home with her son and mother which they have been agreeable to assist in the interim while patient continues to progress.  Patient otherwise stable and agreeable for discharge home, continue aspirin and Plavix x 3 weeks transition to Plavix alone thereafter as well as continued statin.  Otherwise no medication changes as below.  Discharge Diagnoses:  Principal Problem:   Acute CVA (cerebrovascular accident) [facility]) Active Problems:   Acute ischemic stroke ([name])   Cocaine abuse ([place])   Postoperative hypothyroidism   ETOH abuse   HLD  (hyperlipidemia)   Essential hypertension   Tobacco dependence  Acute ischemic stroke Patient with left-sided weakness and visual impairments. MRI confirmed acute CVA, MRI negative for large occlusion Neurology following, aspirin Plavix x 3 weeks followed by Plavix alone Lengthy discussion at bedside about discontinuation of illicit substances including cocaine Continue statin given elevated LDL Echocardiogram showed normal systolic function.  Trivial MR noted.  No other valvular abnormalities appreciated.     Cocaine abuse Patient counseled at length on cessation. Essential hypertension Blood pressure currently well-controlled off medications, consider discontinuing amlodipine at discharge Hyperlipidemia Noncompliant with pravastatin, continue rosuvastatin Hypothyroidism Continue levothyroxine. History of alcohol abuse This is apparently a previous history.  Patient denies any ongoing alcohol use or abuse Tobacco abuse Counseled on cessation  Discharge Instructions  Discharge Instructions     Ambulatory referral to Neurology   Complete by: As directed    Follow up with stroke clinic NP (Jessica Vanschaick or Cecille Rubin, if both not available, consider Zachery Dauer, or Ahern) at [facility] in about 4 weeks. Thanks.      Allergies as of 11/14/2022       Reactions   Gadolinium Derivatives Hives, Itching, Swelling   After MultiHance (gadolinium) injection, pt began sneezing.  After exam, pt told MR tech Burna Mortimer that she was itching.  Dr. Owens Shark evaluated pt, and noticed hives on pt's back.  Pt said that she felt swelling in her throat.  Dr. Owens Shark directed that pt be taken to hospital via ambulance.     Iohexol Hives, Itching, Other (See Comments)    Code: HIVES, Desc: pts tongue began itching post injection and throat burning, Onset Date: [id number]   Proanthocyanidin Swelling   Swelling of the tongue  Grapeseed Extract [nutritional Supplements]    Tongue swelll   Diphenhydramine  Hcl Rash   Tramadol Rash        Medication List     STOP taking these medications    amLODipine 5 MG tablet Commonly known as: NORVASC   aspirin 325 MG tablet Replaced by: aspirin EC 81 MG tablet   pravastatin 20 MG tablet Commonly known as: PRAVACHOL       TAKE these medications    aspirin EC 81 MG tablet Take 1 tablet (81 mg total) by mouth daily. Swallow whole. Start taking on: November 15, 2022 Replaces: aspirin 325 MG tablet   CALCIUM + D3 PO Take 1 tablet by mouth every other day.   clopidogrel 75 MG tablet Commonly known as: PLAVIX Take 1 tablet (75 mg total) by mouth daily. Start taking on: November 15, 2022   levothyroxine 125 MCG tablet Commonly known as: SYNTHROID Take 1 tablet (125 mcg total) by mouth daily before breakfast.   rosuvastatin 40 MG tablet Commonly known as: CRESTOR Take 1 tablet (40 mg total) by mouth daily. Start taking on: November 15, 2022               Durable Medical Equipment  (From admission, onward)           Start     Ordered   11/14/22 1317  For home use only DME 3 n 1  Once        11/14/22 1316   11/14/22 1317  For home use only DME Shower stool  Once        11/14/22 1316   11/14/22 1317  For home use only DME Walker rolling  Once       Question Answer Comment  Walker: With 5 Inch Wheels   Patient needs a walker to treat with the following condition Acute ischemic stroke ([place])      11/14/22 1316            Follow-up Information     [facility]. Schedule an appointment as soon as possible for a visit in 1 month(s).   Specialty: Neurology Why: stroke clinic Contact information: [place] [phone] [phone]               Allergies  Allergen Reactions   Gadolinium Derivatives Hives, Itching and Swelling    After MultiHance (gadolinium) injection, pt began sneezing.  After exam, pt told MR tech Burna Mortimer that she was itching.   Dr. Owens Shark evaluated pt, and noticed hives on pt's back.  Pt said that she felt swelling in her throat.  Dr. Owens Shark directed that pt be taken to hospital via ambulance.     Iohexol Hives, Itching and Other (See Comments)     Code: HIVES, Desc: pts tongue began itching post injection and throat burning, Onset Date: [id number]    Proanthocyanidin Swelling    Swelling of the tongue   Grapeseed Extract [Nutritional Supplements]     Tongue swelll   Diphenhydramine Hcl Rash   Tramadol Rash    Consultations: Neuro  Procedures/Studies: ECHOCARDIOGRAM COMPLETE  Result Date: 11/10/2022    ECHOCARDIOGRAM REPORT   Patient Name:   AUREA ARONOV Date of Exam: 11/10/2022 Medical Rec #:  [id number]         Height:       65.0 in Accession #:    [id number]        Weight:  160.0 lb Date of Birth:  1966-05-28         BSA:          1.799 m Patient Age:    69 years          BP:           142/77 mmHg Patient Gender: F                 HR:           65 bpm. Exam Location:  Inpatient Procedure: 2D Echo, Cardiac Doppler and Color Doppler Indications:    I63.9 Stroke  History:        Patient has prior history of Echocardiogram examinations, most                 recent 12/25/2015. Risk Factors:Hypertension, Dyslipidemia and                 Current Smoker. ETOH and Cocaine abuse.  Sonographer:    Alvino Chapel RCS Referring Phys: [place]  1. Left ventricular ejection fraction, by estimation, is 60 to 65%. The left ventricle has normal function. The left ventricle has no regional wall motion abnormalities. There is moderate left ventricular hypertrophy. Left ventricular diastolic parameters are indeterminate.  2. Right ventricular systolic function is normal. The right ventricular size is normal. There is normal pulmonary artery systolic pressure.  3. Mobile atrial septal aneursym no obvious PFO/shunt.  4. The mitral valve is abnormal. Trivial mitral valve regurgitation. No evidence of mitral  stenosis.  5. The aortic valve is tricuspid. Aortic valve regurgitation is not visualized. No aortic stenosis is present.  6. The inferior vena cava is normal in size with greater than 50% respiratory variability, suggesting right atrial pressure of 3 mmHg. FINDINGS  Left Ventricle: Left ventricular ejection fraction, by estimation, is 60 to 65%. The left ventricle has normal function. The left ventricle has no regional wall motion abnormalities. The left ventricular internal cavity size was normal in size. There is  moderate left ventricular hypertrophy. Left ventricular diastolic parameters are indeterminate. Right Ventricle: The right ventricular size is normal. No increase in right ventricular wall thickness. Right ventricular systolic function is normal. There is normal pulmonary artery systolic pressure. The tricuspid regurgitant velocity is 1.88 m/s, and  with an assumed right atrial pressure of 3 mmHg, the estimated right ventricular systolic pressure is 16.1 mmHg. Left Atrium: Left atrial size was normal in size. Right Atrium: Right atrial size was normal in size. Pericardium: There is no evidence of pericardial effusion. Mitral Valve: The mitral valve is abnormal. There is mild thickening of the mitral valve leaflet(s). Trivial mitral valve regurgitation. No evidence of mitral valve stenosis. Tricuspid Valve: The tricuspid valve is normal in structure. Tricuspid valve regurgitation is trivial. No evidence of tricuspid stenosis. Aortic Valve: The aortic valve is tricuspid. Aortic valve regurgitation is not visualized. No aortic stenosis is present. Pulmonic Valve: The pulmonic valve was normal in structure. Pulmonic valve regurgitation is not visualized. No evidence of pulmonic stenosis. Aorta: The aortic root is normal in size and structure. Venous: The inferior vena cava is normal in size with greater than 50% respiratory variability, suggesting right atrial pressure of 3 mmHg. IAS/Shunts: No atrial level  shunt detected by color flow Doppler.  LEFT VENTRICLE PLAX 2D LVIDd:         4.20 cm   Diastology LVIDs:         2.70 cm  LV e' medial:    5.22 cm/s LV PW:         1.40 cm   LV E/e' medial:  11.0 LV IVS:        1.30 cm   LV e' lateral:   6.96 cm/s LVOT diam:     2.10 cm   LV E/e' lateral: 8.2 LV SV:         64 LV SV Index:   35 LVOT Area:     3.46 cm  RIGHT VENTRICLE RV S prime:     12.70 cm/s TAPSE (M-mode): 1.7 cm LEFT ATRIUM             Index        RIGHT ATRIUM           Index LA diam:        3.40 cm 1.89 cm/m   RA Area:     14.10 cm LA Vol (A2C):   49.4 ml 27.46 ml/m  RA Volume:   35.20 ml  19.56 ml/m LA Vol (A4C):   43.5 ml 24.18 ml/m LA Biplane Vol: 49.3 ml 27.40 ml/m  AORTIC VALVE LVOT Vmax:   85.50 cm/s LVOT Vmean:  55.700 cm/s LVOT VTI:    0.184 m  AORTA Ao Root diam: 3.20 cm MITRAL VALVE               TRICUSPID VALVE MV Area (PHT): 2.62 cm    TR Peak grad:   14.1 mmHg MV Decel Time: 289 msec    TR Vmax:        188.00 cm/s MV E velocity: 57.20 cm/s MV A velocity: 70.30 cm/s  SHUNTS MV E/A ratio:  0.81        Systemic VTI:  0.18 m                            Systemic Diam: 2.10 cm Jenkins Rouge MD Electronically signed by Jenkins Rouge MD Signature Date/Time: 11/10/2022/11:08:52 AM    Final    MR BRAIN WO CONTRAST  Result Date: 11/09/2022 CLINICAL DATA:  Initial evaluation for neuro deficit, stroke suspected. EXAM: MRI HEAD WITHOUT CONTRAST MRA HEAD WITHOUT CONTRAST MRA NECK WITHOUT CONTRAST TECHNIQUE: Multiplanar, multiecho pulse sequences of the brain and surrounding structures were obtained without intravenous contrast. Angiographic images of the Circle of Willis were obtained using MRA technique without intravenous contrast. Angiographic images of the neck were obtained using MRA technique without intravenous contrast. Carotid stenosis measurements (when applicable) are obtained utilizing NASCET criteria, using the distal internal carotid diameter as the denominator. COMPARISON:  Prior CT  from earlier the same day as well as previous MRI from 09/05/2020. FINDINGS: MRI HEAD FINDINGS Brain: Cerebral volume stable, and remains within normal limits. Scattered patchy T2/FLAIR hyperintensity involving the periventricular and deep white matter both cerebral hemispheres as well as the pons, presumably related to chronic microvascular ischemic disease. Changes are mildly progressed as compared to previous MRI. Superimposed remote lacunar infarcts noted about the corpus callosum and left basal ganglia. A new chronic lacunar infarct noted at the anterior right basal ganglia, extending towards the operculum (series 7, image 20). 1.4 cm focus of restricted diffusion involving the para median right dorsal pons, consistent with an acute ischemic nonhemorrhagic infarct (series 2, image 15). Additional tiny subcentimeter acute ischemic nonhemorrhagic cortical infarct noted at the left frontal lobe as well (series 2, image 34). No other evidence for acute or subacute ischemia. No  acute intracranial hemorrhage. Few scattered chronic micro hemorrhages noted, most pronounced about the thalami, most characteristic of poorly controlled hypertension. No mass lesion, mass effect or midline shift. No hydrocephalus or extra-axial fluid collection. Pituitary gland and suprasellar region within normal limits. Vascular: Major intracranial vascular flow voids are maintained. Skull and upper cervical spine: Craniocervical junction with normal limits. Bone marrow signal intensity normal. No scalp soft tissue abnormality. Sinuses/Orbits: Globes and orbital soft tissues within normal limits. Paranasal sinuses are largely clear. No mastoid effusion. Other: None. MRA HEAD FINDINGS ANTERIOR CIRCULATION: Visualized distal cervical segments of both internal carotid arteries are widely patent with antegrade flow. Petrous, cavernous, and supraclinoid segments patent without stenosis. A1 segments patent bilaterally. Normal anterior  communicating artery complex. Focal severe stenosis at the left A2/A3 junction again seen, similar to prior (series 4, image 142). An additional moderate to severe stenosis noted at the contralateral right A2/A3 junction as well (series 4, image 152), progressed from prior. Right M1 segment remains widely patent. Previously seen severe left M1 stenosis appears improved on today's exam, with no more than mild stenosis now seen at this level (series 4, image 115). No proximal MCA branch occlusion or high-grade stenosis. Distal small vessel atheromatous irregularity again noted. POSTERIOR CIRCULATION: Both vertebral arteries patent without stenosis. Right vertebral artery dominant. Left PICA patent. Right PICA not well seen. Basilar patent without stenosis. Superior cerebral arteries patent bilaterally. PCAs widely patent to their distal aspects without significant stenosis. Small bilateral posterior communicating arteries noted. No intracranial aneurysm. MRA NECK FINDINGS AORTIC ARCH: Visualized aortic arch normal caliber with standard branch pattern. No visible stenosis about the origin of the great vessels. RIGHT CAROTID SYSTEM: Right common and internal carotid arteries are patent without evidence for dissection. No significant atheromatous irregularity or stenosis about the right carotid bulb. LEFT CAROTID SYSTEM: Left common and internal carotid arteries remain patent without dissection. Mild eccentric plaque within the mid-distal left CCA without hemodynamically significant greater than 50% stenosis. No significant atheromatous narrowing or irregularity about the left carotid bulb itself. VERTEBRAL ARTERIES: Left vertebral artery arises directly from the aortic arch. Right vertebral artery slightly dominant. Vertebral arteries patent without evidence for dissection or occlusion. IMPRESSION: MRI HEAD IMPRESSION: 1. 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 2. Additional subcentimeter  acute ischemic nonhemorrhagic cortical infarct involving the left frontal lobe. 3. Underlying chronic microvascular ischemic disease with multiple additional remote lacunar infarcts as above. Overall, appearance is mildly progressed as compared to previous MRI from 09/05/2020. 4. Few scattered chronic micro hemorrhages, most characteristic of poorly controlled hypertension. MRA HEAD IMPRESSION: 1. Negative intracranial MRA for large vessel occlusion. 2. Severe left A2/A3 stenosis, similar compared to previous MRA from 09/05/2020. 3. Moderate to severe contralateral right A2/A3 stenosis, progressed from prior. 4. Interval improvement in previously seen severe left M1 stenosis, now no more than mild in nature. 5. Distal small vessel atheromatous irregularity. MRA NECK IMPRESSION: Continued wide patency of both carotid artery systems and vertebral arteries within the neck. No hemodynamically significant stenosis or other acute vascular abnormality. Electronically Signed   By: Jeannine Boga M.D.   On: 11/09/2022 22:50   MR ANGIO HEAD WO CONTRAST  Result Date: 11/09/2022 CLINICAL DATA:  Initial evaluation for neuro deficit, stroke suspected. EXAM: MRI HEAD WITHOUT CONTRAST MRA HEAD WITHOUT CONTRAST MRA NECK WITHOUT CONTRAST TECHNIQUE: Multiplanar, multiecho pulse sequences of the brain and surrounding structures were obtained without intravenous contrast. Angiographic images of the Circle of Willis were obtained using MRA  technique without intravenous contrast. Angiographic images of the neck were obtained using MRA technique without intravenous contrast. Carotid stenosis measurements (when applicable) are obtained utilizing NASCET criteria, using the distal internal carotid diameter as the denominator. COMPARISON:  Prior CT from earlier the same day as well as previous MRI from 09/05/2020. FINDINGS: MRI HEAD FINDINGS Brain: Cerebral volume stable, and remains within normal limits. Scattered patchy T2/FLAIR  hyperintensity involving the periventricular and deep white matter both cerebral hemispheres as well as the pons, presumably related to chronic microvascular ischemic disease. Changes are mildly progressed as compared to previous MRI. Superimposed remote lacunar infarcts noted about the corpus callosum and left basal ganglia. A new chronic lacunar infarct noted at the anterior right basal ganglia, extending towards the operculum (series 7, image 20). 1.4 cm focus of restricted diffusion involving the para median right dorsal pons, consistent with an acute ischemic nonhemorrhagic infarct (series 2, image 15). Additional tiny subcentimeter acute ischemic nonhemorrhagic cortical infarct noted at the left frontal lobe as well (series 2, image 34). No other evidence for acute or subacute ischemia. No acute intracranial hemorrhage. Few scattered chronic micro hemorrhages noted, most pronounced about the thalami, most characteristic of poorly controlled hypertension. No mass lesion, mass effect or midline shift. No hydrocephalus or extra-axial fluid collection. Pituitary gland and suprasellar region within normal limits. Vascular: Major intracranial vascular flow voids are maintained. Skull and upper cervical spine: Craniocervical junction with normal limits. Bone marrow signal intensity normal. No scalp soft tissue abnormality. Sinuses/Orbits: Globes and orbital soft tissues within normal limits. Paranasal sinuses are largely clear. No mastoid effusion. Other: None. MRA HEAD FINDINGS ANTERIOR CIRCULATION: Visualized distal cervical segments of both internal carotid arteries are widely patent with antegrade flow. Petrous, cavernous, and supraclinoid segments patent without stenosis. A1 segments patent bilaterally. Normal anterior communicating artery complex. Focal severe stenosis at the left A2/A3 junction again seen, similar to prior (series 4, image 142). An additional moderate to severe stenosis noted at the  contralateral right A2/A3 junction as well (series 4, image 152), progressed from prior. Right M1 segment remains widely patent. Previously seen severe left M1 stenosis appears improved on today's exam, with no more than mild stenosis now seen at this level (series 4, image 115). No proximal MCA branch occlusion or high-grade stenosis. Distal small vessel atheromatous irregularity again noted. POSTERIOR CIRCULATION: Both vertebral arteries patent without stenosis. Right vertebral artery dominant. Left PICA patent. Right PICA not well seen. Basilar patent without stenosis. Superior cerebral arteries patent bilaterally. PCAs widely patent to their distal aspects without significant stenosis. Small bilateral posterior communicating arteries noted. No intracranial aneurysm. MRA NECK FINDINGS AORTIC ARCH: Visualized aortic arch normal caliber with standard branch pattern. No visible stenosis about the origin of the great vessels. RIGHT CAROTID SYSTEM: Right common and internal carotid arteries are patent without evidence for dissection. No significant atheromatous irregularity or stenosis about the right carotid bulb. LEFT CAROTID SYSTEM: Left common and internal carotid arteries remain patent without dissection. Mild eccentric plaque within the mid-distal left CCA without hemodynamically significant greater than 50% stenosis. No significant atheromatous narrowing or irregularity about the left carotid bulb itself. VERTEBRAL ARTERIES: Left vertebral artery arises directly from the aortic arch. Right vertebral artery slightly dominant. Vertebral arteries patent without evidence for dissection or occlusion. IMPRESSION: MRI HEAD IMPRESSION: 1. 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 2. Additional subcentimeter acute ischemic nonhemorrhagic cortical infarct involving the left frontal lobe. 3. Underlying chronic microvascular ischemic disease with multiple additional remote  lacunar infarcts as  above. Overall, appearance is mildly progressed as compared to previous MRI from 09/05/2020. 4. Few scattered chronic micro hemorrhages, most characteristic of poorly controlled hypertension. MRA HEAD IMPRESSION: 1. Negative intracranial MRA for large vessel occlusion. 2. Severe left A2/A3 stenosis, similar compared to previous MRA from 09/05/2020. 3. Moderate to severe contralateral right A2/A3 stenosis, progressed from prior. 4. Interval improvement in previously seen severe left M1 stenosis, now no more than mild in nature. 5. Distal small vessel atheromatous irregularity. MRA NECK IMPRESSION: Continued wide patency of both carotid artery systems and vertebral arteries within the neck. No hemodynamically significant stenosis or other acute vascular abnormality. Electronically Signed   By: Jeannine Boga M.D.   On: 11/09/2022 22:50   MR ANGIO NECK WO CONTRAST  Result Date: 11/09/2022 CLINICAL DATA:  Initial evaluation for neuro deficit, stroke suspected. EXAM: MRI HEAD WITHOUT CONTRAST MRA HEAD WITHOUT CONTRAST MRA NECK WITHOUT CONTRAST TECHNIQUE: Multiplanar, multiecho pulse sequences of the brain and surrounding structures were obtained without intravenous contrast. Angiographic images of the Circle of Willis were obtained using MRA technique without intravenous contrast. Angiographic images of the neck were obtained using MRA technique without intravenous contrast. Carotid stenosis measurements (when applicable) are obtained utilizing NASCET criteria, using the distal internal carotid diameter as the denominator. COMPARISON:  Prior CT from earlier the same day as well as previous MRI from 09/05/2020. FINDINGS: MRI HEAD FINDINGS Brain: Cerebral volume stable, and remains within normal limits. Scattered patchy T2/FLAIR hyperintensity involving the periventricular and deep white matter both cerebral hemispheres as well as the pons, presumably related to chronic microvascular ischemic disease. Changes  are mildly progressed as compared to previous MRI. Superimposed remote lacunar infarcts noted about the corpus callosum and left basal ganglia. A new chronic lacunar infarct noted at the anterior right basal ganglia, extending towards the operculum (series 7, image 20). 1.4 cm focus of restricted diffusion involving the para median right dorsal pons, consistent with an acute ischemic nonhemorrhagic infarct (series 2, image 15). Additional tiny subcentimeter acute ischemic nonhemorrhagic cortical infarct noted at the left frontal lobe as well (series 2, image 34). No other evidence for acute or subacute ischemia. No acute intracranial hemorrhage. Few scattered chronic micro hemorrhages noted, most pronounced about the thalami, most characteristic of poorly controlled hypertension. No mass lesion, mass effect or midline shift. No hydrocephalus or extra-axial fluid collection. Pituitary gland and suprasellar region within normal limits. Vascular: Major intracranial vascular flow voids are maintained. Skull and upper cervical spine: Craniocervical junction with normal limits. Bone marrow signal intensity normal. No scalp soft tissue abnormality. Sinuses/Orbits: Globes and orbital soft tissues within normal limits. Paranasal sinuses are largely clear. No mastoid effusion. Other: None. MRA HEAD FINDINGS ANTERIOR CIRCULATION: Visualized distal cervical segments of both internal carotid arteries are widely patent with antegrade flow. Petrous, cavernous, and supraclinoid segments patent without stenosis. A1 segments patent bilaterally. Normal anterior communicating artery complex. Focal severe stenosis at the left A2/A3 junction again seen, similar to prior (series 4, image 142). An additional moderate to severe stenosis noted at the contralateral right A2/A3 junction as well (series 4, image 152), progressed from prior. Right M1 segment remains widely patent. Previously seen severe left M1 stenosis appears improved on  today's exam, with no more than mild stenosis now seen at this level (series 4, image 115). No proximal MCA branch occlusion or high-grade stenosis. Distal small vessel atheromatous irregularity again noted. POSTERIOR CIRCULATION: Both vertebral arteries patent without stenosis. Right vertebral artery dominant. Left  PICA patent. Right PICA not well seen. Basilar patent without stenosis. Superior cerebral arteries patent bilaterally. PCAs widely patent to their distal aspects without significant stenosis. Small bilateral posterior communicating arteries noted. No intracranial aneurysm. MRA NECK FINDINGS AORTIC ARCH: Visualized aortic arch normal caliber with standard branch pattern. No visible stenosis about the origin of the great vessels. RIGHT CAROTID SYSTEM: Right common and internal carotid arteries are patent without evidence for dissection. No significant atheromatous irregularity or stenosis about the right carotid bulb. LEFT CAROTID SYSTEM: Left common and internal carotid arteries remain patent without dissection. Mild eccentric plaque within the mid-distal left CCA without hemodynamically significant greater than 50% stenosis. No significant atheromatous narrowing or irregularity about the left carotid bulb itself. VERTEBRAL ARTERIES: Left vertebral artery arises directly from the aortic arch. Right vertebral artery slightly dominant. Vertebral arteries patent without evidence for dissection or occlusion. IMPRESSION: MRI HEAD IMPRESSION: 1. 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 2. Additional subcentimeter acute ischemic nonhemorrhagic cortical infarct involving the left frontal lobe. 3. Underlying chronic microvascular ischemic disease with multiple additional remote lacunar infarcts as above. Overall, appearance is mildly progressed as compared to previous MRI from 09/05/2020. 4. Few scattered chronic micro hemorrhages, most characteristic of poorly controlled  hypertension. MRA HEAD IMPRESSION: 1. Negative intracranial MRA for large vessel occlusion. 2. Severe left A2/A3 stenosis, similar compared to previous MRA from 09/05/2020. 3. Moderate to severe contralateral right A2/A3 stenosis, progressed from prior. 4. Interval improvement in previously seen severe left M1 stenosis, now no more than mild in nature. 5. Distal small vessel atheromatous irregularity. MRA NECK IMPRESSION: Continued wide patency of both carotid artery systems and vertebral arteries within the neck. No hemodynamically significant stenosis or other acute vascular abnormality. Electronically Signed   By: Jeannine Boga M.D.   On: 11/09/2022 22:50   CT HEAD WO CONTRAST (5MM)  Result Date: 11/09/2022 CLINICAL DATA:  Neuro deficit, acute stroke suspected EXAM: CT HEAD WITHOUT CONTRAST TECHNIQUE: Contiguous axial images were obtained from the base of the skull through the vertex without intravenous contrast. RADIATION DOSE REDUCTION: This exam was performed according to the departmental dose-optimization program which includes automated exposure control, adjustment of the mA and/or kV according to patient size and/or use of iterative reconstruction technique. COMPARISON:  CT brain, 09/05/2020 FINDINGS: Brain: New, although age indeterminate and subacute to chronic appearing infarctions of the right anterior limb of the internal capsule and caudate head as well as the adjacent corona radiata (series 3, image 14). Unchanged chronic left basal ganglia and corona radiata infarctions (series 3, image 18). No hemorrhage, hydrocephalus, extra-axial collection or mass lesion/mass effect. Vascular: No hyperdense vessel or unexpected calcification. Skull: Normal. Negative for fracture or focal lesion. Sinuses/Orbits: No acute finding. Other: None. IMPRESSION: 1. New, although age indeterminate and subacute to chronic appearing infarctions of the right anterior limb of the internal capsule and caudate head  as well as the adjacent corona radiata. Consider MRI to evaluate for acutely diffusion restricting infarction if suspected. 2. Unchanged chronic left basal ganglia and corona radiata infarctions. 3. No acute intracranial hemorrhage. Electronically Signed   By: Delanna Ahmadi M.D.   On: 11/09/2022 19:09   DG Chest Portable 1 View  Result Date: 11/09/2022 CLINICAL DATA:  Presyncope, dyspnea EXAM: PORTABLE CHEST 1 VIEW COMPARISON:  05/25/2018 FINDINGS: The heart size and mediastinal contours are within normal limits. Both lungs are clear. The visualized skeletal structures are unremarkable. Surgical clips again noted at the neck base bilaterally. IMPRESSION: No  active disease. Electronically Signed   By: Fidela Salisbury M.D.   On: 11/09/2022 17:47     Subjective: No acute issues or events overnight   Discharge Exam: Vitals:   11/14/22 0814 11/14/22 1120  BP: 105/75 110/67  Pulse: 98 77  Resp: 17 17  Temp: 99.3 F (37.4 C) 98.9 F (37.2 C)  SpO2: 96% 93%   Vitals:   11/13/22 2343 11/14/22 0327 11/14/22 0814 11/14/22 1120  BP: 132/70 132/73 105/75 110/67  Pulse: 99 (!) 101 98 77  Resp: '16 17 17 17  '$ Temp: 99.5 F (37.5 C) (!) 100.4 F (38 C) 99.3 F (37.4 C) 98.9 F (37.2 C)  TempSrc: Oral Oral Oral Oral  SpO2: 97% 96% 96% 93%  Weight:      Height:        General: Pt is alert, awake, not in acute distress Cardiovascular: RRR, S1/S2 +, no rubs, no gallops Respiratory: CTA bilaterally, no wheezing, no rhonchi Abdominal: Soft, NT, ND, bowel sounds + Extremities: no edema, no cyanosis    The results of significant diagnostics from this hospitalization (including imaging, microbiology, ancillary and laboratory) are listed below for reference.     Microbiology: No results found for this or any previous visit (from the past 240 hour(s)).   Labs: BNP (last 3 results) No results for input(s): "BNP" in the last 8760 hours. Basic Metabolic Panel: Recent Labs  Lab 11/08/22 1849  11/09/22 1718 11/11/22 0258 11/12/22 0506  NA 142  --  139 139  K 3.8  --  3.5 4.0  CL 105  --  108 108  CO2 25  --  20* 21*  GLUCOSE 102*  --  107* 104*  BUN 11  --  14 16  CREATININE 0.90  --  0.82 0.82  CALCIUM 8.5*  --  8.4* 8.7*  MG  --  1.9  --  1.8   Liver Function Tests: Recent Labs  Lab 11/08/22 1849  AST 15  ALT 14  ALKPHOS 55  BILITOT 0.8  PROT 6.7  ALBUMIN 4.1   No results for input(s): "LIPASE", "AMYLASE" in the last 168 hours. No results for input(s): "AMMONIA" in the last 168 hours. CBC: Recent Labs  Lab 11/08/22 1849 11/11/22 0258  WBC 5.6 5.7  NEUTROABS 3.4  --   HGB 16.2* 15.1*  HCT 49.1* 45.4  MCV 86.0 86.6  PLT 196 173   Cardiac Enzymes: No results for input(s): "CKTOTAL", "CKMB", "CKMBINDEX", "TROPONINI" in the last 168 hours. BNP: Invalid input(s): "POCBNP" CBG: No results for input(s): "GLUCAP" in the last 168 hours. D-Dimer No results for input(s): "DDIMER" in the last 72 hours. Hgb A1c No results for input(s): "HGBA1C" in the last 72 hours. Lipid Profile No results for input(s): "CHOL", "HDL", "LDLCALC", "TRIG", "CHOLHDL", "LDLDIRECT" in the last 72 hours. Thyroid function studies No results for input(s): "TSH", "T4TOTAL", "T3FREE", "THYROIDAB" in the last 72 hours.  Invalid input(s): "FREET3" Anemia work up No results for input(s): "VITAMINB12", "FOLATE", "FERRITIN", "TIBC", "IRON", "RETICCTPCT" in the last 72 hours. Urinalysis    Component Value Date/Time   COLORURINE YELLOW 09/05/2020 1631   APPEARANCEUR CLEAR 09/05/2020 1631   LABSPEC 1.016 09/05/2020 1631   PHURINE 7.0 09/05/2020 1631   GLUCOSEU NEGATIVE 09/05/2020 1631   HGBUR NEGATIVE 09/05/2020 [place] 09/05/2020 1631   BILIRUBINUR neg 07/17/2017 [place] 09/05/2020 1631   PROTEINUR NEGATIVE 09/05/2020 1631   UROBILINOGEN 0.2 07/17/2017 1530   UROBILINOGEN 0.2  05/09/2014 0902   NITRITE NEGATIVE 09/05/2020 [place] 09/05/2020 1631   Sepsis Labs Recent Labs  Lab 11/08/22 1849 11/11/22 0258  WBC 5.6 5.7   Microbiology No results found for this or any previous visit (from the past 240 hour(s)).   Time coordinating discharge: Over 30 minutes  SIGNED:   Little Ishikawa, DO [facility] 11/14/2022, 1:35 PM Pager   If 7PM-7AM, please contact night-coverage [email or web address]

## 2022-11-14 NOTE — Evaluation (Signed)
Speech Language Pathology Evaluation Patient Details Name: Theresa Watkins MRN: [id number] DOB: [birth year] Today's Date: 11/14/2022 Time: 8657-8469 SLP Time Calculation (min) (ACUTE ONLY): 20 min  Problem List:  Patient Active Problem List   Diagnosis Date Noted   Acute CVA (cerebrovascular accident) ([place]) 11/10/2022   Acute ischemic stroke ([place]) 11/09/2022   Chondromalacia patellae, 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   Unilateral primary osteoarthritis, left hip 09/25/2021   Left foot pain 08/26/2019   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   Tobacco dependence 04/09/2019   Vitamin D deficiency 02/18/2018   Toe deformity, acquired, right 02/18/2018   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   Hyperlipemia 03/21/2017   Headache 03/21/2017   Tooth pain    CVA (cerebral infarction) 12/27/2015   HLD (hyperlipidemia)    Hemiparesis affecting dominant side as late effect of stroke ([place])    Tobacco abuse    Cocaine abuse ([place])    ETOH abuse    Postoperative hypothyroidism 12/24/2015   Drug abuse, cocaine type ([place]) 05/09/2014   Hx of thyroid cancer 01/25/2014   Essential hypertension 06/03/2012   Past Medical History:  Past Medical History:  Diagnosis Date   Abnormal Pap smear    Bronchitis    Fibroid    Headache(784.0)    Ovarian cyst    Plantar fasciitis    Seizures ([place])    Stroke ([place])    Thyroid cancer ([place]) [address]11   Trichomonas    Urinary tract infection    Past Surgical History:  Past Surgical History:  Procedure Laterality Date   ABDOMINAL HYSTERECTOMY     CESAREAN SECTION     goiter     removed   laparoscopic  1988   removal  of ectopic preg, ruptured tube   THERAPEUTIC ABORTION     THYROIDECTOMY  march 2011   cancer   HPI:  Patient is a 56 y/o female who presents on 12/22 with dizziness, left sided weakness/numbness and diploplia.  + cocaine. 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 Pt worked with SLP in 2017 for higher level  cognitive deficits, discharging still needing Mod A for safety awareness. PMH includes CVA, HTN, thyroid cancer, cocaine use, seizures, UTI.   Assessment / Plan / Recommendation Clinical Impression  Pt presents with acute changes in cognition and speech. She describes improvement in both since her prior stroke, to the point that she was living independently again. Today she has reduced sustained attention and mild-moderate impairment on delayed recall task from [place]. Pt begins to have a lot more difficulty as content becomes more abstract, demonstrating more moderate difficulty with reasoning/similarities subtest. Pt has limited awareness of deficits and says that she thinks with every stroke she becomes more of a free thinker. She thinks it will help her "become a comedian" without awareness of potential safety implications. She believes her speech is acutely worsened although she is overall intelligible. Recommend SLP f/u at SNF.    SLP Assessment  SLP Recommendation/Assessment: Patient needs continued Speech [facility] SLP Visit Diagnosis: Cognitive communication deficit (R41.841)    Recommendations for follow up therapy are one component of a multi-disciplinary discharge planning process, led by the attending physician.  Recommendations may be updated based on patient status, additional functional criteria and insurance authorization.    Follow Up Recommendations  Skilled nursing-short term rehab (<3 hours/day)    Assistance Recommended at Discharge  Frequent or constant Supervision/Assistance  Functional Status Assessment Patient has had a recent decline in  their functional status and demonstrates the ability to make significant improvements in function in a reasonable and predictable amount of time.  Frequency and Duration min 2x/week  2 weeks      SLP Evaluation Cognition  Overall Cognitive Status: Impaired/Different from baseline Arousal/Alertness:  Awake/alert Orientation Level: Oriented X4 Attention: Sustained Sustained Attention: Impaired Sustained Attention Impairment: Verbal complex Memory: Impaired Memory Impairment: Retrieval deficit;Decreased recall of new information Awareness: Impaired Awareness Impairment: Anticipatory impairment Problem Solving: Impaired Problem Solving Impairment: Verbal complex Safety/Judgment: Impaired       Comprehension  Auditory Comprehension Overall Auditory Comprehension: Appears within functional limits for tasks assessed (within simple tasks)    Expression Expression Primary Mode of Expression: Verbal Verbal Expression Overall Verbal Expression: Impaired Initiation: No impairment Level of Generative/Spontaneous Verbalization: Conversation [employer] of Communication: Not applicable Other Verbal Expression Comments: intermittent word-finding errors noted in conversation; uses circumlocition at times   Oral / Pharmacist, community Speech Overall Motor Speech: Impaired Respiration: Within functional limits Phonation: Normal Resonance: Within functional limits Articulation: Impaired Level of Impairment: Conversation Intelligibility: Intelligible Interfering Components: Premorbid status (h/o dysarthria, although pt says acutely worsened)            Osie Bond., M.A. [place] Office [phone]  Secure chat preferred  11/14/2022, 2:03 PM

## 2022-11-14 NOTE — Progress Notes (Signed)
The patient requires a bedside commode as she is not able to make it to the restroom in a timely manner on the floor of the home she will be staying.

## 2022-11-14 NOTE — Plan of Care (Signed)
  Problem: Education: Goal: Knowledge of secondary prevention will improve (MUST DOCUMENT ALL) Outcome: Progressing   Problem: Ischemic Stroke/TIA Tissue Perfusion: Goal: Complications of ischemic stroke/TIA will be minimized Outcome: Progressing   Problem: Coping: Goal: Will verbalize positive feelings about self Outcome: Progressing Goal: Will identify appropriate support needs Outcome: Progressing   Problem: Health Behavior/Discharge Planning: Goal: Ability to manage health-related needs will improve Outcome: Progressing Goal: Goals will be collaboratively established with patient/family Outcome: Progressing   Problem: Self-Care: Goal: Verbalization of feelings and concerns over difficulty with self-care will improve Outcome: Progressing   Problem: Nutrition: Goal: Dietary intake will improve Outcome: Progressing   Problem: Health Behavior/Discharge Planning: Goal: Ability to manage health-related needs will improve Outcome: Progressing   Problem: Clinical Measurements: Goal: Ability to maintain clinical measurements within normal limits will improve Outcome: Progressing Goal: Will remain free from infection Outcome: Progressing Goal: Diagnostic test results will improve Outcome: Progressing Goal: Respiratory complications will improve Outcome: Progressing Goal: Cardiovascular complication will be avoided Outcome: Progressing   Problem: Activity: Goal: Risk for activity intolerance will decrease Outcome: Progressing   Problem: Nutrition: Goal: Adequate nutrition will be maintained Outcome: Progressing   Problem: Coping: Goal: Level of anxiety will decrease Outcome: Progressing   Problem: Elimination: Goal: Will not experience complications related to bowel motility Outcome: Progressing Goal: Will not experience complications related to urinary retention Outcome: Progressing   Problem: Pain Managment: Goal: General experience of comfort will  improve Outcome: Progressing   Problem: Safety: Goal: Ability to remain free from injury will improve Outcome: Progressing   Problem: Skin Integrity: Goal: Risk for impaired skin integrity will decrease Outcome: Progressing   Problem: Education: Goal: Knowledge of disease or condition will improve Outcome: Completed/Met Goal: Knowledge of patient specific risk factors will improve [place] N/A or DELETE if not current risk factor) Outcome: Completed/Met   Problem: Self-Care: Goal: Ability to participate in self-care as condition permits will improve Outcome: Completed/Met Goal: Ability to communicate needs accurately will improve Outcome: Completed/Met   Problem: Nutrition: Goal: Risk of aspiration will decrease Outcome: Completed/Met   Problem: Education: Goal: Knowledge of General Education information will improve Description: Including pain rating scale, medication(s)/side effects and non-pharmacologic comfort measures Outcome: Completed/Met

## 2022-11-19 ENCOUNTER — Ambulatory Visit: Payer: Medicaid Other | Admitting: Internal Medicine

## 2022-11-19 ENCOUNTER — Telehealth: Payer: Self-pay

## 2022-11-19 NOTE — Telephone Encounter (Signed)
Transition Care Management Follow-up Telephone Call  Call completed with patient's mother, Theresa Watkins.  I had tried patient's number [phone] and the phone just rang, no option to leave a message Date of discharge and from where: 11/14/2022, [facility] How have you been since you were released from the hospital? Her mother stated that her daughter  is feeling better.  Any questions or concerns? No  Items Reviewed: Did the pt receive and understand the discharge instructions provided?  Her mother said that as far as she knows her daughter has them .  Medications obtained and verified?  Her mother said her daughter has all of her medications and she is managing the medication regime by herself.  Other? No  Any new allergies since your discharge? No  Dietary orders reviewed? Yes Do you have support at home?  Lives alone but her mother and son check in on her,   Home Care and Equipment/Supplies: Were home health services ordered? no If so, what is the name of the agency? N/a  Has the agency set up a time to come to the patient's home? not applicable Were any new equipment or medical supplies ordered?  Yes: RW, BSC, shower stool  What is the name of the medical supply agency? She received them from the hospital Were you able to get the supplies/equipment? yes Do you have any questions related to the use of the equipment or supplies? No  Functional Questionnaire: (I = Independent and D = Dependent) ADLs: per Theresa Watkins, the patient is trying to do everything herself.   Her mother was interested in outpatient PT/OT and I gave her the phone number for outpatient neurorehab. I explained that referrals were made for outpatient therapy and she can call to schedule appointments if she has not heard from them by the end of the week    Bathing/Dressing- I  Meal Prep- D- family is making meals for her.   Eating- I  Maintaining continence- I  Transferring/Ambulation- I with RW  Managing  Meds- I  Follow up appointments reviewed:  PCP Hospital f/u appt confirmed? Yes  Scheduled to see Dr Wynetta Emery - 12/12/2022.  [facility] f/u appt confirmed?  Does not have an appointment scheduled with neurology yet.   Are transportation arrangements needed? Yes - I provided her mother with the phone number for [employer] transportation: [phone].  If their condition worsens, is the pt aware to call PCP or go to the Emergency Dept.? Yes Was the patient provided with contact information for the PCP's office or ED? Yes Was to pt encouraged to call back with questions or concerns? Yes

## 2022-11-27 ENCOUNTER — Ambulatory Visit: Payer: Medicaid Other | Admitting: Rehabilitation

## 2022-11-27 ENCOUNTER — Ambulatory Visit: Payer: Medicaid Other | Admitting: Occupational Therapy

## 2022-11-29 ENCOUNTER — Encounter: Payer: Self-pay | Admitting: Physical Therapy

## 2022-11-29 ENCOUNTER — Ambulatory Visit: Payer: Medicaid Other | Attending: Internal Medicine | Admitting: Physical Therapy

## 2022-11-29 VITALS — BP 139/81 | HR 72

## 2022-11-29 DIAGNOSIS — R2681 Unsteadiness on feet: Secondary | ICD-10-CM | POA: Diagnosis not present

## 2022-11-29 DIAGNOSIS — R32 Unspecified urinary incontinence: Secondary | ICD-10-CM | POA: Diagnosis not present

## 2022-11-29 DIAGNOSIS — I639 Cerebral infarction, unspecified: Secondary | ICD-10-CM | POA: Insufficient documentation

## 2022-11-29 DIAGNOSIS — Z9181 History of falling: Secondary | ICD-10-CM | POA: Diagnosis not present

## 2022-11-29 DIAGNOSIS — R278 Other lack of coordination: Secondary | ICD-10-CM | POA: Insufficient documentation

## 2022-11-29 DIAGNOSIS — R41840 Attention and concentration deficit: Secondary | ICD-10-CM | POA: Insufficient documentation

## 2022-11-29 DIAGNOSIS — R2689 Other abnormalities of gait and mobility: Secondary | ICD-10-CM

## 2022-11-29 DIAGNOSIS — N3946 Mixed incontinence: Secondary | ICD-10-CM | POA: Diagnosis not present

## 2022-11-29 DIAGNOSIS — M6281 Muscle weakness (generalized): Secondary | ICD-10-CM

## 2022-11-29 NOTE — Therapy (Signed)
OUTPATIENT PHYSICAL THERAPY NEURO EVALUATION   Patient Name: Theresa Watkins MRN: [id number] DOB:[birth year], 57 y.o., female Today's Date: 11/29/2022   PCP: Ladell Pier, MD   REFERRING PROVIDER:  Little Ishikawa, MD  END OF SESSION:  PT End of Session - 11/29/22 1533     Visit Number 1    Number of Visits 7    Date for PT Re-Evaluation 01/28/23   due to potential delay in scheduling   Authorization Type Medicaid [employer]    PT Start Time 1310    PT Stop Time 1356    PT Time Calculation (min) 46 min    Equipment Utilized During Treatment Gait belt    Activity Tolerance Patient tolerated treatment well    Behavior During Therapy WFL for tasks assessed/performed             Past Medical History:  Diagnosis Date   Abnormal Pap smear    Bronchitis    Fibroid    Headache(784.0)    Ovarian cyst    Plantar fasciitis    Seizures ([place])    Stroke ([place])    Thyroid cancer ([place]) [address]11   Trichomonas    Urinary tract infection    Past Surgical History:  Procedure Laterality Date   ABDOMINAL HYSTERECTOMY     CESAREAN SECTION     goiter     removed   laparoscopic  1988   removal  of ectopic preg, ruptured tube   THERAPEUTIC ABORTION     THYROIDECTOMY  march 2011   cancer   Patient Active Problem List   Diagnosis Date Noted   Acute CVA (cerebrovascular accident) ([place]) 11/10/2022   Acute ischemic stroke ([place]) 11/09/2022   Chondromalacia patellae, 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   Unilateral primary osteoarthritis, left hip 09/25/2021   Left foot pain 08/26/2019   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   Tobacco dependence 04/09/2019   Vitamin D deficiency 02/18/2018   Toe deformity, acquired, right 02/18/2018   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   Hyperlipemia 03/21/2017   Headache 03/21/2017   Tooth pain    CVA (cerebral infarction) 12/27/2015   HLD (hyperlipidemia)    Hemiparesis affecting dominant side as late effect of  stroke ([place])    Tobacco abuse    Cocaine abuse ([place])    ETOH abuse    Postoperative hypothyroidism 12/24/2015   Drug abuse, cocaine type ([place]) 05/09/2014   Hx of thyroid cancer 01/25/2014   Essential hypertension 06/03/2012    ONSET DATE: 11/14/2022  REFERRING DIAG: I63.9 (ICD-10-CM) - Cerebrovascular accident (CVA), unspecified mechanism ([place]) I63.9 (ICD-10-CM) - Acute CVA (cerebrovascular accident) ([place])  THERAPY DIAG:  Unsteadiness on feet  History of falling  Other abnormalities of gait and mobility  Muscle weakness (generalized)  Rationale for Evaluation and Treatment: Rehabilitation  SUBJECTIVE:  SUBJECTIVE STATEMENT: Pt discharged from hospital on 11/14/22. Never ended up receiving home health. Reports visual deficits from her more recent stroke, unable to fully describe. Reports vision is getting worse. Uses a RW around the house. Did not bring it in to therapy today due to it being too cumbersome. Reports having at least 10 falls since being home from the hospital. Did not injure herself. Reports these falls happened without pt using her walker. Reports does not need help with bathing or dressing. Has not done any chores, cleaning or cooking. Before most recent stroke, was using her cane once in a blue moon to help her walk. Was not really using any device.   Pt accompanied by: family member pt's mom  PERTINENT HISTORY: Presented on 12/22 with dizziness, left sided weakness/numbness and diplopia + cocaine. 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 PMH includes CVA (2017), HTN, thyroid cancer, cocaine use, seizures, UTI, former EtOH abuse.   PAIN:  Are you having pain? Yes: NPRS scale: 10/10 Pain location: L>R knees, back, arms, shoulders Pain description: Aching Aggravating  factors: Being asked about it.  Relieving factors: Not doing anything.   Vitals:   11/29/22 1332  BP: 139/81  Pulse: 72     PRECAUTIONS: Fall  WEIGHT BEARING RESTRICTIONS: No  FALLS: Has patient fallen in last 6 months? Yes. Number of falls 10  LIVING ENVIRONMENT: Lives with: lives alone, mom and son comes over to help her with things  Lives in: House/apartment Stairs: Yes: External: 1 steps; none Has following equipment at home: Single point cane, Walker - 2 wheeled, and shower chair Pt uses Depends so she doesn't have to go to the bathroom as much.   PLOF: Independent  PATIENT GOALS: Wants to have better balance.   OBJECTIVE:   DIAGNOSTIC FINDINGS:  Imaging on 11/09/22:   IMPRESSION: MRI HEAD IMPRESSION:   1. 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 2. Additional subcentimeter acute ischemic nonhemorrhagic cortical infarct involving the left frontal lobe. 3. Underlying chronic microvascular ischemic disease with multiple additional remote lacunar infarcts as above. Overall, appearance is mildly progressed as compared to previous MRI from 09/05/2020. 4. Few scattered chronic micro hemorrhages, most characteristic of poorly controlled hypertension.   MRA HEAD IMPRESSION:   1. Negative intracranial MRA for large vessel occlusion. 2. Severe left A2/A3 stenosis, similar compared to previous MRA from 09/05/2020. 3. Moderate to severe contralateral right A2/A3 stenosis, progressed from prior. 4. Interval improvement in previously seen severe left M1 stenosis, now no more than mild in nature. 5. Distal small vessel atheromatous irregularity.   MRA NECK IMPRESSION:   Continued wide patency of both carotid artery systems and vertebral arteries within the neck. No hemodynamically significant stenosis or other acute vascular abnormality.  COGNITION: Overall cognitive status: Impaired   SENSATION: Light touch: Felt different on L  side Pt reports sometimes numbness and tingling in L foot   COORDINATION: Heel to shin: WFL   POSTURE: increased lumbar lordosis  Oculomotor: Able to perform smooth pursuits WFL, pt able to see one pen except when getting to the upper L side quadrant then pt reporting seeing 3-4 pens    LOWER EXTREMITY MMT:    MMT Right Eval Left Eval  Hip flexion 4 4-  Hip extension    Hip abduction 5 5  Hip adduction 5 5  Hip internal rotation    Hip external rotation    Knee flexion 5 3+  Knee extension 5 3+  Ankle dorsiflexion    Ankle plantarflexion    Ankle inversion    Ankle eversion    (Blank rows = not tested)  All tested in sitting    TRANSFERS: Assistive device utilized: None  Sit to stand: SBA Stand to sit: SBA  No UE support used    GAIT: Gait pattern: step through pattern, decreased step length- Right, decreased step length- Left, decreased stride length, and narrow BOS,staggering to the L  Distance walked: Clinic distances  Assistive device utilized: None Level of assistance: CGA Comments: Pt with bilat toe in (pt reports she has always walked like this), staggering to the L.   FUNCTIONAL TESTS:  5 times sit to stand: 19.56 seconds without UE support, reports incr fatigue afterwards  Timed up and go (TUG): 10.97 seconds with no AD  10 meter walk test: 14.44 seconds with no AD = 2.27 ft/sec   With romberg stance on level ground, pt unable to hold >5 seconds before losing balance to R, needing min A from therapist.   PATIENT SURVEYS:  FOTO Stroke LE: 45   TODAY'S TREATMENT:                                                                                                                              N/A during eval    PATIENT EDUCATION: Education details: Clinical findings, POC, importance of using RW as much as she can due to incr fall risk and pt has had about 10 falls since being in the hospital. Medicaid visit limit. PT to also request ST referral  Person  educated: Patient and Parent Education method: Explanation Education comprehension: verbalized understanding  HOME EXERCISE PROGRAM: Will provide at next session.   GOALS: Goals reviewed with patient? Yes  SHORT TERM GOALS: Target date: 12/20/2022  Pt will be independent with initial HEP in order to build upon functional gains made in therapy. Baseline: Dependent  Goal status: INITIAL  2.  BERG to be assessed with LTG written.  Baseline: Not yet assessed.  Goal status: INITIAL  3.  Pt will improve 5x sit<>stand to less than or equal to 17 sec to demonstrate improved functional strength and transfer efficiency.  Baseline: 19.56 seconds without UE support, reports incr fatigue afterwards Goal status: INITIAL  4. Pt will improve gait speed with no AD vs. LRAD to at least 2.6 ft/sec in order to demo improved community mobility. Baseline: 14.44 seconds with no AD = 2.27 ft/sec  Goal status: INITIAL   LONG TERM GOALS: Target date: 01/10/2023  Pt will be independent with final HEP in order to build upon functional gains made in therapy. Baseline: Dependent Goal status: INITIAL  2.  BERG goal to be written  Baseline: Not yet assessed.  Goal status: INITIAL  3.  Pt will improve gait speed with no AD vs. LRAD to at least 2.9 ft/sec in order to demo improved community mobility. Baseline: 14.44 seconds with no AD = 2.27  ft/sec  Goal status: INITIAL  4.  Pt will improve 5x sit<>stand to less than or equal to 14.5 sec to demonstrate improved functional strength and transfer efficiency.  Baseline: 19.56 seconds without UE support, reports incr fatigue afterwards Goal status: INITIAL  5.  Pt will ambulate at least 500' outdoors over paved surfaces with LRAD in order to demo improved community mobility.  Baseline: Not yet assessed.  Goal status: INITIAL  6.  Pt will improve FOTO to at least 60 to demo improved functional outcomes.  Baseline: 45 Goal status:  INITIAL  ASSESSMENT:  CLINICAL IMPRESSION: Patient is a 57 year old female referred to Neuro OPPT for CVA (10/2022).   Pt's PMH is significant for: CVA (2017), HTN, thyroid cancer, cocaine use, seizures, UTI, former EtOH abuse. The following deficits were present during the exam: decr activity tolerance, decr strength, impaired balance, gait abnormalities, decr safety awareness/awareness of deficits, recent falls, impaired coordination, impaired sensation, visual deficits. Based on 5x sit <> stand, pt is an incr risk for falls.  Pt's gait speed indicates a limited community ambulator. Will perform further fall risk/balance assessment at next session. Pt would benefit from skilled PT to address these impairments and functional limitations to maximize functional mobility independence and decr fall risk.    OBJECTIVE IMPAIRMENTS: Abnormal gait, decreased activity tolerance, decreased balance, decreased cognition, decreased coordination, decreased endurance, decreased knowledge of condition, decreased knowledge of use of DME, decreased mobility, difficulty walking, decreased strength, decreased safety awareness, impaired sensation, impaired vision/preception, postural dysfunction, and pain.   ACTIVITY LIMITATIONS: carrying, bending, stairs, transfers, bathing, toileting, and locomotion level  PARTICIPATION LIMITATIONS: meal prep, cleaning, driving, shopping, and community activity  PERSONAL FACTORS: Age, Behavior pattern, Past/current experiences, Time since onset of injury/illness/exacerbation, and 3+ comorbidities: CVA (2017), HTN, thyroid cancer, cocaine use, seizures, UTI, former EtOH abuse.   are also affecting patient's functional outcome.   REHAB POTENTIAL: Good  CLINICAL DECISION MAKING: Evolving/moderate complexity  EVALUATION COMPLEXITY: Moderate  PLAN:  PT FREQUENCY: 1x/week  PT DURATION: 8 weeks  PLANNED INTERVENTIONS: Therapeutic exercises, Therapeutic activity, Neuromuscular  re-education, Balance training, Gait training, Patient/Family education, Self Care, Stair training, Vestibular training, DME instructions, and Re-evaluation  PLAN FOR NEXT SESSION: Perform BERG and write goal. Initial HEP for balance/strength.   Arliss Journey, PT, DPT  11/29/2022, 3:35 PM   Check all possible CPT codes: [phone] - PT Re-evaluation, 97110- Therapeutic Exercise, [phone]- Neuro Re-education, [phone] - Gait Training, [phone] - Therapeutic Activities, [phone] - Self Care, and [phone] - Orthotic Fit    Check all conditions that are expected to impact treatment: Neurological condition   If treatment provided at initial evaluation, no treatment charged due to lack of authorization.

## 2022-12-05 ENCOUNTER — Ambulatory Visit: Payer: Medicaid Other | Admitting: Physical Therapy

## 2022-12-09 ENCOUNTER — Telehealth: Payer: Self-pay | Admitting: Physical Therapy

## 2022-12-09 ENCOUNTER — Ambulatory Visit: Payer: Medicaid Other | Admitting: Occupational Therapy

## 2022-12-09 ENCOUNTER — Ambulatory Visit: Payer: Medicaid Other | Admitting: Physical Therapy

## 2022-12-09 NOTE — Telephone Encounter (Signed)
Attempted to call patient due to no show appointment today. Unable to reach patient. Had to leave voicemail on pt's mom's phone regarding No Show policy and next appt. Educated that if pt doesn't come to next appt on Thurs 12/12/22, then will need to cancel all future appts.   Janann August, PT, DPT 12/09/22 3:09 PM   [facility] [address] [place], [place]  [address] Phone:  [phone] Fax:  [phone]

## 2022-12-11 ENCOUNTER — Other Ambulatory Visit (HOSPITAL_COMMUNITY): Payer: Self-pay

## 2022-12-12 ENCOUNTER — Other Ambulatory Visit: Payer: Self-pay

## 2022-12-12 ENCOUNTER — Encounter: Payer: Self-pay | Admitting: Internal Medicine

## 2022-12-12 ENCOUNTER — Encounter: Payer: Self-pay | Admitting: Physical Therapy

## 2022-12-12 ENCOUNTER — Ambulatory Visit: Payer: Medicaid Other | Admitting: Physical Therapy

## 2022-12-12 ENCOUNTER — Ambulatory Visit: Payer: Medicaid Other | Attending: Internal Medicine | Admitting: Internal Medicine

## 2022-12-12 VITALS — BP 130/72 | HR 68

## 2022-12-12 VITALS — BP 140/78 | HR 63 | Ht 65.0 in | Wt 160.0 lb

## 2022-12-12 DIAGNOSIS — Z9181 History of falling: Secondary | ICD-10-CM | POA: Diagnosis not present

## 2022-12-12 DIAGNOSIS — Z1231 Encounter for screening mammogram for malignant neoplasm of breast: Secondary | ICD-10-CM | POA: Diagnosis not present

## 2022-12-12 DIAGNOSIS — Z1331 Encounter for screening for depression: Secondary | ICD-10-CM

## 2022-12-12 DIAGNOSIS — I639 Cerebral infarction, unspecified: Secondary | ICD-10-CM | POA: Diagnosis not present

## 2022-12-12 DIAGNOSIS — R7303 Prediabetes: Secondary | ICD-10-CM

## 2022-12-12 DIAGNOSIS — M25561 Pain in right knee: Secondary | ICD-10-CM

## 2022-12-12 DIAGNOSIS — I10 Essential (primary) hypertension: Secondary | ICD-10-CM | POA: Diagnosis not present

## 2022-12-12 DIAGNOSIS — E890 Postprocedural hypothyroidism: Secondary | ICD-10-CM | POA: Diagnosis not present

## 2022-12-12 DIAGNOSIS — R2681 Unsteadiness on feet: Secondary | ICD-10-CM

## 2022-12-12 DIAGNOSIS — F17200 Nicotine dependence, unspecified, uncomplicated: Secondary | ICD-10-CM

## 2022-12-12 DIAGNOSIS — Z09 Encounter for follow-up examination after completed treatment for conditions other than malignant neoplasm: Secondary | ICD-10-CM

## 2022-12-12 DIAGNOSIS — M6281 Muscle weakness (generalized): Secondary | ICD-10-CM | POA: Diagnosis not present

## 2022-12-12 DIAGNOSIS — Z8673 Personal history of transient ischemic attack (TIA), and cerebral infarction without residual deficits: Secondary | ICD-10-CM

## 2022-12-12 DIAGNOSIS — R2689 Other abnormalities of gait and mobility: Secondary | ICD-10-CM

## 2022-12-12 DIAGNOSIS — F1410 Cocaine abuse, uncomplicated: Secondary | ICD-10-CM

## 2022-12-12 DIAGNOSIS — R41840 Attention and concentration deficit: Secondary | ICD-10-CM | POA: Diagnosis not present

## 2022-12-12 DIAGNOSIS — R278 Other lack of coordination: Secondary | ICD-10-CM | POA: Diagnosis not present

## 2022-12-12 MED ORDER — ASPIRIN 81 MG PO TBEC
81.0000 mg | DELAYED_RELEASE_TABLET | Freq: Every day | ORAL | 1 refills | Status: DC
  Filled 2022-12-12: qty 90, 90d supply, fill #0

## 2022-12-12 MED ORDER — ROSUVASTATIN CALCIUM 40 MG PO TABS
40.0000 mg | ORAL_TABLET | Freq: Every day | ORAL | 1 refills | Status: DC
  Filled 2022-12-12: qty 90, 90d supply, fill #0
  Filled 2023-03-16: qty 90, 90d supply, fill #1
  Filled 2023-03-17 – 2023-03-18 (×2): qty 90, 90d supply, fill #0

## 2022-12-12 MED ORDER — LEVOTHYROXINE SODIUM 125 MCG PO TABS
125.0000 ug | ORAL_TABLET | Freq: Every day | ORAL | 1 refills | Status: DC
  Filled 2022-12-12: qty 90, 90d supply, fill #0
  Filled 2023-03-16: qty 90, 90d supply, fill #1
  Filled 2023-03-17 – 2023-03-18 (×2): qty 90, 90d supply, fill #0

## 2022-12-12 MED ORDER — AMLODIPINE BESYLATE 2.5 MG PO TABS
2.5000 mg | ORAL_TABLET | Freq: Every day | ORAL | 1 refills | Status: DC
  Filled 2022-12-12: qty 90, 90d supply, fill #0

## 2022-12-12 NOTE — Patient Instructions (Addendum)
Stop Plavix.  Continue aspirin 81 mg daily and the cholesterol medicine called rosuvastatin 40 mg daily. Restart amlodipine 2.5 mg daily for blood pressure.

## 2022-12-12 NOTE — Therapy (Signed)
OUTPATIENT PHYSICAL THERAPY NEURO TREATMENT   Patient Name: Theresa Watkins MRN: [id number] DOB:10/02/1966, 57 y.o., female Today's Date: 12/12/2022   PCP: Ladell Pier, MD REFERRING PROVIDER:  Little Ishikawa, MD  END OF SESSION:  PT End of Session - 12/12/22 1316     Visit Number 2    Number of Visits 7    Date for PT Re-Evaluation 01/28/23    Authorization Type Medicaid [employer]    PT Start Time 1316    PT Stop Time 6294    PT Time Calculation (min) 40 min    Equipment Utilized During Treatment Gait belt    Activity Tolerance Patient tolerated treatment well    Behavior During Therapy WFL for tasks assessed/performed             Past Medical History:  Diagnosis Date   Abnormal Pap smear    Bronchitis    Fibroid    Headache(784.0)    Ovarian cyst    Plantar fasciitis    Seizures ([place])    Stroke ([place])    Thyroid cancer ([place]) [address]   Trichomonas    Urinary tract infection    Past Surgical History:  Procedure Laterality Date   ABDOMINAL HYSTERECTOMY     CESAREAN SECTION     goiter     removed   laparoscopic  1988   removal  of ectopic preg, ruptured tube   THERAPEUTIC ABORTION     THYROIDECTOMY  march 2011   cancer   Patient Active Problem List   Diagnosis Date Noted   Acute CVA (cerebrovascular accident) ([place]) 11/10/2022   Acute ischemic stroke ([place]) 11/09/2022   Chondromalacia patellae, 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   Unilateral primary osteoarthritis, left hip 09/25/2021   Left foot pain 08/26/2019   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   Tobacco dependence 04/09/2019   Vitamin D deficiency 02/18/2018   Toe deformity, acquired, right 02/18/2018   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   Hyperlipemia 03/21/2017   Headache 03/21/2017   Tooth pain    CVA (cerebral infarction) 12/27/2015   HLD (hyperlipidemia)    Hemiparesis affecting dominant side as late effect of stroke ([place])    Tobacco abuse    Cocaine  abuse ([place])    ETOH abuse    Postoperative hypothyroidism 12/24/2015   Drug abuse, cocaine type ([place]) 05/09/2014   Hx of thyroid cancer 01/25/2014   Essential hypertension 06/03/2012    ONSET DATE: 11/14/2022  REFERRING DIAG: I63.9 (ICD-10-CM) - Cerebrovascular accident (CVA), unspecified mechanism ([place]) I63.9 (ICD-10-CM) - Acute CVA (cerebrovascular accident) ([place])  THERAPY DIAG:  Unsteadiness on feet  History of falling  Other abnormalities of gait and mobility  Muscle weakness (generalized)  Rationale for Evaluation and Treatment: Rehabilitation  SUBJECTIVE:  SUBJECTIVE STATEMENT: Pt arrives to the session and reports continued L shoulder pain. Patient reports 1 fall since last physical therapy visit; she states she sat up and fell. Patient reports that she is trying not to use her walker at all. She ambulates into clinic today without it as it is hard to navigate with her in the home. Patient reports that she also has a follow up with PCP.   Pt accompanied by: family member  PERTINENT HISTORY: Presented on 12/22 with dizziness, left sided weakness/numbness and diplopia + cocaine. 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 PMH includes CVA (2017), HTN, thyroid cancer, cocaine use, seizures, UTI, former EtOH abuse.   PAIN:  Are you having pain? Yes: NPRS scale: 8/10 Pain location: L shoulder Pain description: Aching Aggravating factors: moving my arm Relieving factors: Not doing anything.   Vitals:   12/12/22 1323  BP: 130/72  Pulse: 68    PRECAUTIONS: Fall  WEIGHT BEARING RESTRICTIONS: No  FALLS: Has patient fallen in last 6 months? Yes. Number of falls 10  LIVING ENVIRONMENT: Lives with: lives alone, mom and son comes over to help her with things  Lives in:  House/apartment Stairs: Yes: External: 1 steps; none Has following equipment at home: Single point cane, Walker - 2 wheeled, and shower chair Pt uses Depends so she doesn't have to go to the bathroom as much.   PLOF: Independent  PATIENT GOALS: Wants to have better balance.   OBJECTIVE:   DIAGNOSTIC FINDINGS:  Imaging on 11/09/22:   IMPRESSION: MRI HEAD IMPRESSION:   1. 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 2. Additional subcentimeter acute ischemic nonhemorrhagic cortical infarct involving the left frontal lobe. 3. Underlying chronic microvascular ischemic disease with multiple additional remote lacunar infarcts as above. Overall, appearance is mildly progressed as compared to previous MRI from 09/05/2020. 4. Few scattered chronic micro hemorrhages, most characteristic of poorly controlled hypertension.   MRA HEAD IMPRESSION:   1. Negative intracranial MRA for large vessel occlusion. 2. Severe left A2/A3 stenosis, similar compared to previous MRA from 09/05/2020. 3. Moderate to severe contralateral right A2/A3 stenosis, progressed from prior. 4. Interval improvement in previously seen severe left M1 stenosis, now no more than mild in nature. 5. Distal small vessel atheromatous irregularity.   MRA NECK IMPRESSION:   Continued wide patency of both carotid artery systems and vertebral arteries within the neck. No hemodynamically significant stenosis or other acute vascular abnormality.   TODAY'S TREATMENT:                                                                                                                                 [facility] PT Assessment - 12/12/22 0001       Balance   Balance Assessed Yes      Standardized Balance Assessment   Standardized Balance Assessment Berg Balance Test      Berg Balance Test   Sit to  Stand Able to stand  independently using hands    Standing Unsupported Able to stand safely 2 minutes     Sitting with Back Unsupported but Feet Supported on Floor or Stool Able to sit safely and securely 2 minutes    Stand to Sit Controls descent by using hands    Transfers Able to transfer safely, definite need of hands    Standing Unsupported with Eyes Closed Needs help to keep from falling    Standing Unsupported with Feet Together Able to place feet together independently and stand for 1 minute with supervision    From Standing, Reach Forward with Outstretched Arm Can reach forward >5 cm safely (2")    From Standing Position, Pick up Object from Floor Unable to pick up and needs supervision    From Standing Position, Turn to Look Behind Over each Shoulder Needs supervision when turning    Turn 360 Degrees Needs close supervision or verbal cueing    Standing Unsupported, Alternately Place Feet on Step/Stool Able to complete >2 steps/needs minimal assist    Standing Unsupported, One Foot in [employer] balance while stepping or standing    Standing on One Leg Tries to lift leg/unable to hold 3 seconds but remains standing independently    Total Score 27             For HEP and Reviewed in Session: Sit to stand: 2 x 10 (SBA) without UE use from low mat; max cues for eccentric lower Countertop marching with UE support: 2 x 20 steps (SBA)  PATIENT EDUCATION: Education details: Clinical findings and initial HEP Medicaid visit limit. PT to also request ST referral  Person educated: Patient and Parent Education method: Explanation Education comprehension: verbalized understanding  HOME EXERCISE PROGRAM: Access Code: [email or web address] URL: [email or web address] Date: 12/12/2022 Prepared by: Malachi Carl  Exercises - Sit to Stand Without Arm Support  - 1 x daily - 7 x weekly - 3 sets - 10 reps - Standing March with Counter Support  - 1 x daily - 7 x weekly - 3 sets - 10 reps  GOALS: Goals reviewed with patient? Yes  SHORT TERM GOALS: Target date: 12/20/2022  Pt will be  independent with initial HEP in order to build upon functional gains made in therapy. Baseline: Dependent  Goal status: INITIAL  2. Patient will improve Berg Balance score by 5 points to indicate clinically significant progress towards a decreased risk of falls and improved static stability.   Baseline: 27/56 Goal status: INITIAL  3.  Pt will improve 5x sit<>stand to less than or equal to 17 sec to demonstrate improved functional strength and transfer efficiency.  Baseline: 19.56 seconds without UE support, reports incr fatigue afterwards Goal status: INITIAL  4. Pt will improve gait speed with no AD vs. LRAD to at least 2.6 ft/sec in order to demo improved community mobility. Baseline: 14.44 seconds with no AD = 2.27 ft/sec  Goal status: INITIAL   LONG TERM GOALS: Target date: 01/10/2023  Pt will be independent with final HEP in order to build upon functional gains made in therapy. Baseline: Dependent Goal status: INITIAL  2.  Patient will improve Berg Balance score to 45/56 or greater to indicate a decreased risk of falls and improved static stability.   Baseline: 27/56 Goal status: INITIAL  3.  Pt will improve gait speed with no AD vs. LRAD to at least 2.9 ft/sec in order to demo improved community mobility. Baseline: 14.44 seconds with no AD =  2.27 ft/sec  Goal status: INITIAL  4.  Pt will improve 5x sit<>stand to less than or equal to 14.5 sec to demonstrate improved functional strength and transfer efficiency.  Baseline: 19.56 seconds without UE support, reports incr fatigue afterwards Goal status: INITIAL  5.  Pt will ambulate at least 500' outdoors over paved surfaces with LRAD in order to demo improved community mobility.  Baseline: Not yet assessed.  Goal status: INITIAL  6.  Pt will improve FOTO to at least 60 to demo improved functional outcomes.  Baseline: 45 Goal status: INITIAL  ASSESSMENT:  CLINICAL IMPRESSION: Session emphasized review of BERG and  creation of initial HEP. Patient required frequent redirection throughout session to maintain focus. Patient arrived to session in cowboy boots which appear to increase her unsteadiness. Recommend use of tennis shoes next session. Patient falls consistently to the left with eyes closed balance when tested for the berg and needed minA from therapist to regain balance. Patient is in need of skilled physical therapy services in order to address impairments. Continue POC.   OBJECTIVE IMPAIRMENTS: Abnormal gait, decreased activity tolerance, decreased balance, decreased cognition, decreased coordination, decreased endurance, decreased knowledge of condition, decreased knowledge of use of DME, decreased mobility, difficulty walking, decreased strength, decreased safety awareness, impaired sensation, impaired vision/preception, postural dysfunction, and pain.   ACTIVITY LIMITATIONS: carrying, bending, stairs, transfers, bathing, toileting, and locomotion level  PARTICIPATION LIMITATIONS: meal prep, cleaning, driving, shopping, and community activity  PERSONAL FACTORS: Age, Behavior pattern, Past/current experiences, Time since onset of injury/illness/exacerbation, and 3+ comorbidities: CVA (2017), HTN, thyroid cancer, cocaine use, seizures, UTI, former EtOH abuse.   are also affecting patient's functional outcome.   REHAB POTENTIAL: Good  CLINICAL DECISION MAKING: Evolving/moderate complexity  EVALUATION COMPLEXITY: Moderate  PLAN:  PT FREQUENCY: 1x/week  PT DURATION: 8 weeks  PLANNED INTERVENTIONS: Therapeutic exercises, Therapeutic activity, Neuromuscular re-education, Balance training, Gait training, Patient/Family education, Self Care, Stair training, Vestibular training, DME instructions, and Re-evaluation  PLAN FOR NEXT SESSION: Review initial HEP and add onto session; gait training with cane to increase safety/mobility in the home  Esperanza Heir, PT, DPT  12/12/2022, 4:15 PM  Check all  possible CPT codes: [phone] - PT Re-evaluation, 97110- Therapeutic Exercise, [phone]- Neuro Re-education, [phone] - Gait Training, [phone] - Therapeutic Activities, [phone] - Self Care, and [phone] - [place] all conditions that are expected to impact treatment: Neurological condition  If treatment provided at initial evaluation, no treatment charged due to lack of authorization.

## 2022-12-12 NOTE — Progress Notes (Signed)
Patient ID: Theresa Watkins, female    DOB: [birth year]  MRN: [id number]  CC: Hospitalization Follow-up (Hospitalization f/u due to stroke. Med refill./Pain in R knee & L shoulder x1 mo./No to flu vax. )   Subjective: Theresa Watkins is a 57 y.o. female who presents for hosp f/u Her concerns today include:  Patient with history of HTN, HL, CVA (LT basal ganglia hemorrhagic infarct 12/2015), hypothyroidism (hx of thyroid CA 2011), GERD, anxious mood, tob dep, episodic cocaine    Patient was hospitalized 12/22-28/2023 with acute CVA.  Patient had presented with left-sided weakness and visual impairments.  Part of her hospital discharge is as follows: Acute ischemic stroke Patient with left-sided weakness and visual impairments. MRI confirmed acute CVA, MRI negative for large occlusion Neurology following, aspirin Plavix x 3 weeks followed by Plavix alone Lengthy discussion at bedside about discontinuation of illicit substances including cocaine Continue statin given elevated LDL Echocardiogram showed normal systolic function.  Trivial MR noted.  No other valvular abnormalities appreciated.    Patient continues to progress with PT OT and speech.  Unfortunately due to patient's illicit substance abuse she has been difficult to place.  At this time she is otherwise stable and agreeable for discharge home with home health PT OT.  We discussed need for ongoing assistance at home with her son and mother which they have been agreeable to assist in the interim while patient continues to progress.  Patient otherwise stable and agreeable for discharge home, continue aspirin and Plavix x 3 weeks transition to Plavix alone thereafter as well as continued statin.  Otherwise no medication changes as below.  Of note A1c during hospitalization was 6.  Today: Patient reports she is better.  She has just started outpatient physical therapy.  She had her second visit today.  She will be going once a week.  She  has been taking aspirin and Plavix.  She is out of the Crestor.  Reports her blood pressure has been okay with physical therapy.  She was discharged from the hospital without any blood pressure medication.  She has not been checking blood pressure at home as her device no longer works. She has been cutting down on smoking and plans to quit.  Down to 1 cigarette a day.  She feels she will be able to quit on her own.  Admits that she still uses cocaine but not as often.  Last used 3 to 4 days ago. Out of levothyroxine for 3 to 4 days.  She has refill on the prescription.  She is requesting that all of her prescriptions be sent to our pharmacy today.  Patient with positive depression screen.  However she tells me that she does not feel depressed and she denies any suicidal ideation.  Does not feel she needs any medication or counseling at this time.  In regards to the prediabetes: She tells me that she is also drinking sweet tea.  Otherwise she feels she does okay with her eating habits.  She had fell about 2 to 3 months ago hurting her shoulder and knees.  She feels the skin below the right patella is not healing.  HM: Due for mammogram.  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   Tobacco dependence 04/09/2019   Vitamin D deficiency 02/18/2018   Toe deformity, acquired, right 02/18/2018   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   Hyperlipemia 03/21/2017   Headache 03/21/2017   Tooth pain    CVA (cerebral infarction) 12/27/2015   HLD (hyperlipidemia)    Hemiparesis affecting dominant side as late effect of stroke ([place])    Tobacco abuse    Cocaine use disorder  ([place])    ETOH abuse    Postoperative hypothyroidism 12/24/2015   Drug abuse, cocaine type ([place]) 05/09/2014   Hx of thyroid cancer 01/25/2014   Essential hypertension 06/03/2012     Current Outpatient Medications on File Prior to Visit  Medication Sig Dispense Refill   Calcium Carb-Cholecalciferol (CALCIUM + D3 PO) Take 1 tablet by mouth every other day.     [DISCONTINUED] atorvastatin (LIPITOR) 20 MG tablet Take 1 tablet (20 mg total) by mouth daily. 30 tablet 6   No current facility-administered medications on file prior to visit.    Allergies  Allergen Reactions   Gadolinium Derivatives Hives, Itching and Swelling    After MultiHance (gadolinium) injection, pt began sneezing.  After exam, pt told MR tech Burna Mortimer that she was itching.  Dr. Owens Shark evaluated pt, and noticed hives on pt's back.  Pt said that she felt swelling in her throat.  Dr. Owens Shark directed that pt be taken to hospital via ambulance.     Iohexol Hives, Itching and Other (See Comments)     Code: HIVES, Desc: pts tongue began itching post injection and throat burning, Onset Date: [id number]    Proanthocyanidin Swelling    Swelling of the tongue   Grapeseed Extract [Nutritional Supplements]     Tongue swelll   Diphenhydramine Hcl Rash   Tramadol Rash    Social History   Socioeconomic History   Marital status: Divorced    Spouse name: Not on file   Number of children: 1   Years of education: Not on file   Highest education level: Not on file  Occupational History   Occupation: disabled  Tobacco Use   Smoking status: Light Smoker    Packs/day: 0.10    Years: 25.00    Total pack years: 2.50    Types: Cigarettes   Smokeless tobacco: Never   Tobacco comments:    smoke 2 a day trying to quit  Vaping Use   Vaping Use: Never used  Substance and Sexual Activity   Alcohol use: Yes    Alcohol/week: 0.0 standard drinks of alcohol    Comment: occ   Drug use: Yes    Types: Cocaine    Comment: 1-2x monthly    Sexual activity: Not Currently    Birth control/protection: Surgical  Other Topics Concern   Not on file  Social History Narrative   Not on file   Social Determinants of Health   Financial Resource Strain: Not on file  Food Insecurity: No Food Insecurity (11/10/2022)   Hunger Vital Sign    Worried About Running Out of Food in the Last Year: Never true    Ran Out of Food in the Last Year: Never true  Transportation Needs: No Transportation Needs (11/10/2022)   PRAPARE - Hydrologist (Medical): No    Lack of Transportation (Non-Medical): No  Physical Activity: Not on file  Stress: Not on file  Social Connections: Not on file  Intimate Partner Violence: Not At Risk (11/10/2022)  Humiliation, Afraid, Rape, and Kick questionnaire    Fear of Current or Ex-Partner: No    Emotionally Abused: No    Physically Abused: No    Sexually Abused: No    Family History  Problem Relation Age of Onset   Bell's palsy Mother    Hypertension Mother    Diabetes Mother    Stroke Father    Prostate cancer Father    Breast cancer Cousin    Hypertension Sister    Colon cancer Maternal Aunt    Colon cancer Paternal Aunt    Diverticulitis Sister    Diabetes Sister     Past Surgical History:  Procedure Laterality Date   ABDOMINAL HYSTERECTOMY     CESAREAN SECTION     goiter     removed   laparoscopic  1988   removal  of ectopic preg, ruptured tube   THERAPEUTIC ABORTION     THYROIDECTOMY  march 2011   cancer    ROS: Review of Systems Negative except as stated above  PHYSICAL EXAM: BP (!) 140/78   Pulse 63   Ht '5\' 5"'$  (1.651 m)   Wt 160 lb (72.6 kg)   SpO2 99%   BMI 26.63 kg/m   Physical Exam  General appearance - alert, well appearing, and in no distress Mental status - normal mood, behavior, speech, dress, motor activity, and thought processes Neck - supple, no significant adenopathy Chest - clear to auscultation, no wheezes, rales or rhonchi,  symmetric air entry Heart - normal rate, regular rhythm, normal S1, S2, no murmurs, rubs, clicks or gallops Extremities - peripheral pulses normal, no pedal edema, no clubbing or cyanosis Skin - Healed scar healed scar about 3 x 3 cm noted below/inferior to the right patella.  Slightly tender to touch.      Latest Ref Rng & Units 11/12/2022    5:06 AM 11/11/2022    2:58 AM 11/08/2022    6:49 PM  CMP  Glucose 70 - 99 mg/dL 104  107  102   BUN 6 - 20 mg/dL '16  14  11   '$ Creatinine 0.44 - 1.00 mg/dL 0.82  0.82  0.90   Sodium 135 - 145 mmol/L 139  139  142   Potassium 3.5 - 5.1 mmol/L 4.0  3.5  3.8   Chloride 98 - 111 mmol/L 108  108  105   CO2 22 - 32 mmol/L '21  20  25   '$ Calcium 8.9 - 10.3 mg/dL 8.7  8.4  8.5   Total Protein 6.5 - 8.1 g/dL   6.7   Total Bilirubin 0.3 - 1.2 mg/dL   0.8   Alkaline Phos 38 - 126 U/L   55   AST 15 - 41 U/L   15   ALT 0 - 44 U/L   14    Lipid Panel     Component Value Date/Time   CHOL 223 (H) 11/10/2022 0321   CHOL 235 (H) 09/24/2022 1126   TRIG 100 11/10/2022 0321   HDL 51 11/10/2022 0321   HDL 56 09/24/2022 1126   CHOLHDL 4.4 11/10/2022 0321   VLDL 20 11/10/2022 0321   LDLCALC 152 (H) 11/10/2022 0321   LDLCALC 165 (H) 09/24/2022 1126    CBC    Component Value Date/Time   WBC 5.7 11/11/2022 0258   RBC 5.24 (H) 11/11/2022 0258   HGB 15.1 (H) 11/11/2022 0258   HGB 15.4 09/24/2022 1126   HCT 45.4 11/11/2022 0258   HCT  46.2 09/24/2022 1126   PLT 173 11/11/2022 0258   PLT 188 09/24/2022 1126   MCV 86.6 11/11/2022 0258   MCV 87 09/24/2022 1126   MCH 28.8 11/11/2022 0258   MCHC 33.3 11/11/2022 0258   RDW 12.9 11/11/2022 0258   RDW 13.1 09/24/2022 1126   LYMPHSABS 1.7 11/08/2022 1849   LYMPHSABS 2.4 04/10/2020 1140   MONOABS 0.4 11/08/2022 1849   EOSABS 0.1 11/08/2022 1849   EOSABS 0.1 04/10/2020 1140   BASOSABS 0.0 11/08/2022 1849   BASOSABS 0.0 04/10/2020 1140    ASSESSMENT AND PLAN:  1. Hospital discharge follow-up   2.  History of CVA (cerebrovascular accident) Discussed the importance of good blood pressure control, smoking cessation, cholesterol control, cessation of cocaine use.  She has completed 3 weeks of Plavix and aspirin.  Plan was to stop the aspirin and continue the Plavix.  However patient states she does not like the Plavix and would prefer to continue with the aspirin instead.  She had not been taking aspirin consistently prior to the stroke.  So we will have her stop the Plavix and continue the aspirin - aspirin EC 81 MG tablet; Take 1 tablet (81 mg total) by mouth daily. Swallow whole.  Dispense: 90 tablet; Refill: 1 - rosuvastatin (CRESTOR) 40 MG tablet; Take 1 tablet (40 mg total) by mouth daily.  Dispense: 90 tablet; Refill: 1  3. Essential hypertension Encouraged low-salt diet.  Restart low-dose of Norvasc.  Goal for blood pressure is 130/80 or lower. - amLODipine (NORVASC) 2.5 MG tablet; Take 1 tablet (2.5 mg total) by mouth daily.  Dispense: 90 tablet; Refill: 1  4. Tobacco dependence Commended her on trying to quit.  She declines medicine to help her quit.  5. Cocaine use disorder ([place]) Strongly advised to discontinue use.  6. Postoperative hypothyroidism - levothyroxine (SYNTHROID) 125 MCG tablet; Take 1 tablet (125 mcg total) by mouth daily before breakfast.  Dispense: 90 tablet; Refill: 1  7. Prediabetes Patient advised to eliminate sugary drinks from the diet, cut back on portion sizes especially of white carbohydrates, eat more white lean meat like chicken [place] and seafood instead of beef or pork and incorporate fresh fruits and vegetables into the diet daily.   8. Acute pain of right knee Advised patient that this abrasion has healed.  Nothing further to do at this time  9. Positive depression screening Patient denies depression and does not feel she needs any medication or counseling.  10. Encounter for screening mammogram for malignant neoplasm of breast - MM Digital  Screening; Future    Patient was given the opportunity to ask questions.  Patient verbalized understanding of the plan and was able to repeat key elements of the plan.   This documentation was completed using Radio producer.  Any transcriptional errors are unintentional.  Orders Placed This Encounter  Procedures   MM Digital Screening     Requested Prescriptions   Signed Prescriptions Disp Refills   aspirin EC 81 MG tablet 90 tablet 1    Sig: Take 1 tablet (81 mg total) by mouth daily. Swallow whole.   levothyroxine (SYNTHROID) 125 MCG tablet 90 tablet 1    Sig: Take 1 tablet (125 mcg total) by mouth daily before breakfast.   rosuvastatin (CRESTOR) 40 MG tablet 90 tablet 1    Sig: Take 1 tablet (40 mg total) by mouth daily.   amLODipine (NORVASC) 2.5 MG tablet 90 tablet 1    Sig: Take 1 tablet (2.5  mg total) by mouth daily.    Return in about 4 months (around 04/12/2023).  Karle Plumber, MD, FACP

## 2022-12-16 NOTE — Progress Notes (Unsigned)
[facility] [address] [place]. [place] [address] [phone]       HOSPITAL FOLLOW UP NOTE  [name] Date of Birth:  12-22-1965 Medical Record Number:  [id number]   Reason for Referral:  hospital stroke follow up    SUBJECTIVE:   CHIEF COMPLAINT:  No chief complaint on file.   HPI:   Theresa Watkins is a 57 y.o. female with history of seizure, cocaine abuse, smoker, HTN, and hx of stroke in 2017 who presented to ED on 11/08/2022 with blurry vision, dizziness, and headache.  Stroke workup revealed right paramedian pontine infarct and left frontal punctate infarcts likely from small vessel disease given uncontrolled risk factors.  CTA head/neck bilateral A2/A3 stenosis.  EF 60 to 65%.  LDL 152.  A1c 6.0.  UDS positive for cocaine.  Recommended DAPT for 3 weeks then Plavix alone as well as initiated Crestor 40 mg daily.  Tobacco and cocaine cessation counseling provided.  Therapies recommended HH therapies.         PERTINENT IMAGING  Per hospitalization 11/08/2022 - 11/14/2022 CT new right caudate head/BG age chronic infarct MRI right pontine infarct, left frontal punctate infarcts x 2 MRA head and neck bilateral A2/A3 stenosis 2D Echo EF 60 to 65% LDL 152 HgbA1c pending UDS positive for cocaine    ROS:   14 system review of systems performed and negative with exception of ***  PMH:  Past Medical History:  Diagnosis Date   Abnormal Pap smear    Bronchitis    Fibroid    Headache(784.0)    Ovarian cyst    Plantar fasciitis    Seizures (HCC)    Stroke (HCC)    Thyroid cancer ([place]) [address]11   Trichomonas    Urinary tract infection     PSH:  Past Surgical History:  Procedure Laterality Date   ABDOMINAL HYSTERECTOMY     CESAREAN SECTION     goiter     removed   laparoscopic  1988   removal  of ectopic preg, ruptured tube   THERAPEUTIC ABORTION     THYROIDECTOMY  march 2011   cancer    Social History:  Social  History   Socioeconomic History   Marital status: Divorced    Spouse name: Not on file   Number of children: 1   Years of education: Not on file   Highest education level: Not on file  Occupational History   Occupation: disabled  Tobacco Use   Smoking status: Light Smoker    Packs/day: 0.10    Years: 25.00    Total pack years: 2.50    Types: Cigarettes   Smokeless tobacco: Never   Tobacco comments:    smoke 2 a day trying to quit  Vaping Use   Vaping Use: Never used  Substance and Sexual Activity   Alcohol use: Yes    Alcohol/week: 0.0 standard drinks of alcohol    Comment: occ   Drug use: Yes    Types: Cocaine    Comment: 1-2x monthly   Sexual activity: Not Currently    Birth control/protection: Surgical  Other Topics Concern   Not on file  Social History Narrative   Not on file   Social Determinants of Health   Financial Resource Strain: Not on file  Food Insecurity: No Food Insecurity (11/10/2022)   Hunger Vital Sign    Worried About Running Out of Food in the Last Year: Never true    Ran Out  of Food in the Last Year: Never true  Transportation Needs: No Transportation Needs (11/10/2022)   PRAPARE - Hydrologist (Medical): No    Lack of Transportation (Non-Medical): No  Physical Activity: Not on file  Stress: Not on file  Social Connections: Not on file  Intimate Partner Violence: Not At Risk (11/10/2022)   Humiliation, Afraid, Rape, and Kick questionnaire    Fear of Current or Ex-Partner: No    Emotionally Abused: No    Physically Abused: No    Sexually Abused: No    Family History:  Family History  Problem Relation Age of Onset   Bell's palsy Mother    Hypertension Mother    Diabetes Mother    Stroke Father    Prostate cancer Father    Breast cancer Cousin    Hypertension Sister    Colon cancer Maternal Aunt    Colon cancer Paternal Aunt    Diverticulitis Sister    Diabetes Sister     Medications:   Current  Outpatient Medications on File Prior to Visit  Medication Sig Dispense Refill   amLODipine (NORVASC) 2.5 MG tablet Take 1 tablet (2.5 mg total) by mouth daily. 90 tablet 1   aspirin EC 81 MG tablet Take 1 tablet (81 mg total) by mouth daily. Swallow whole. 90 tablet 1   Calcium Carb-Cholecalciferol (CALCIUM + D3 PO) Take 1 tablet by mouth every other day.     levothyroxine (SYNTHROID) 125 MCG tablet Take 1 tablet (125 mcg total) by mouth daily before breakfast. 90 tablet 1   rosuvastatin (CRESTOR) 40 MG tablet Take 1 tablet (40 mg total) by mouth daily. 90 tablet 1   [DISCONTINUED] atorvastatin (LIPITOR) 20 MG tablet Take 1 tablet (20 mg total) by mouth daily. 30 tablet 6   No current facility-administered medications on file prior to visit.    Allergies:   Allergies  Allergen Reactions   Gadolinium Derivatives Hives, Itching and Swelling    After MultiHance (gadolinium) injection, pt began sneezing.  After exam, pt told MR tech Burna Mortimer that she was itching.  Dr. Owens Shark evaluated pt, and noticed hives on pt's back.  Pt said that she felt swelling in her throat.  Dr. Owens Shark directed that pt be taken to hospital via ambulance.     Iohexol Hives, Itching and Other (See Comments)     Code: HIVES, Desc: pts tongue began itching post injection and throat burning, Onset Date: [id number]    Proanthocyanidin Swelling    Swelling of the tongue   Grapeseed Extract [Nutritional Supplements]     Tongue swelll   Diphenhydramine Hcl Rash   Tramadol Rash      OBJECTIVE:  Physical Exam  There were no vitals filed for this visit. There is no height or weight on file to calculate BMI. No results found.     09/19/2022    4:34 PM  Depression screen PHQ 2/9  Down, Depressed, Hopeless 2  PHQ - 2 Score 2  Altered sleeping 3  Feeling bad or failure about yourself  2  Trouble concentrating 2  Suicidal thoughts 1  PHQ-9 Score 10     General: well developed, well nourished, seated, in no evident  distress Head: head normocephalic and atraumatic.   Neck: supple with no carotid or supraclavicular bruits Cardiovascular: regular rate and rhythm, no murmurs Musculoskeletal: no deformity Skin:  no rash/petichiae Vascular:  Normal pulses all extremities   Neurologic Exam Mental Status: Awake and fully alert.  Oriented to place and time. Recent and remote memory intact. Attention span, concentration and fund of knowledge appropriate. Mood and affect appropriate.  Cranial Nerves: Fundoscopic exam reveals sharp disc margins. Pupils equal, briskly reactive to light. Extraocular movements full without nystagmus. Visual fields full to confrontation. Hearing intact. Facial sensation intact. Face, tongue, palate moves normally and symmetrically.  Motor: Normal bulk and tone. Normal strength in all tested extremity muscles Sensory.: intact to touch , pinprick , position and vibratory sensation.  Coordination: Rapid alternating movements normal in all extremities. Finger-to-nose and heel-to-shin performed accurately bilaterally. Gait and Station: Arises from chair without difficulty. Stance is normal. Gait demonstrates normal stride length and balance with ***. Tandem walk and heel toe ***.  Reflexes: 1+ and symmetric. Toes downgoing.     NIHSS  *** Modified Rankin  ***      ASSESSMENT: Theresa Watkins is a 57 y.o. year old female with right paramedian pontine infarct and left frontal punctate infarcts on 11/08/2022 likely secondary to small vessel disease given uncontrolled risk factors.  Vascular risk factors include HTN, HLD, tobacco use, cocaine use and history of stroke in 2017.      PLAN:  Ischemic strokes:  Residual deficit: ***.  Continue Plavix and rosuvastatin (Crestor) for secondary stroke prevention.   Discussed secondary stroke prevention measures and importance of close PCP follow up for aggressive stroke risk factor management including BP goal<130/90, HLD with LDL goal<70  and DM with A1c.<7 .  Stroke labs 10/2022: LDL 152, A1c 6.0 I have gone over the pathophysiology of stroke, warning signs and symptoms, risk factors and their management in some detail with instructions to go to the closest emergency room for symptoms of concern.     Follow up in *** or call earlier if needed   CC:  [facility] provider: Dr. Leonie Man PCP: Ladell Pier, MD    I spent *** minutes of face-to-face and non-face-to-face time with patient.  This included previsit chart review including review of recent hospitalization, lab review, study review, order entry, electronic health record documentation, patient education regarding recent stroke including etiology, secondary stroke prevention measures and importance of managing stroke risk factors, residual deficits and typical recovery time and answered all other questions to patient satisfaction   Frann Rider, AGNP-BC  [facility] [address] [place], [place] [address]  Phone [phone] Fax [phone] Note: This document was prepared with digital dictation and possible smart phrase technology. Any transcriptional errors that result from this process are unintentional.

## 2022-12-17 ENCOUNTER — Other Ambulatory Visit (HOSPITAL_COMMUNITY): Payer: Self-pay

## 2022-12-17 ENCOUNTER — Ambulatory Visit: Payer: Medicaid Other | Admitting: Adult Health

## 2022-12-17 ENCOUNTER — Encounter: Payer: Self-pay | Admitting: Adult Health

## 2022-12-17 ENCOUNTER — Other Ambulatory Visit: Payer: Self-pay

## 2022-12-17 VITALS — BP 144/88 | HR 72 | Ht 65.0 in | Wt 160.2 lb

## 2022-12-17 DIAGNOSIS — I639 Cerebral infarction, unspecified: Secondary | ICD-10-CM

## 2022-12-17 DIAGNOSIS — I69398 Other sequelae of cerebral infarction: Secondary | ICD-10-CM

## 2022-12-17 DIAGNOSIS — F1410 Cocaine abuse, uncomplicated: Secondary | ICD-10-CM | POA: Diagnosis not present

## 2022-12-17 DIAGNOSIS — I69354 Hemiplegia and hemiparesis following cerebral infarction affecting left non-dominant side: Secondary | ICD-10-CM

## 2022-12-17 DIAGNOSIS — Z720 Tobacco use: Secondary | ICD-10-CM | POA: Diagnosis not present

## 2022-12-17 DIAGNOSIS — Z09 Encounter for follow-up examination after completed treatment for conditions other than malignant neoplasm: Secondary | ICD-10-CM

## 2022-12-17 DIAGNOSIS — R269 Unspecified abnormalities of gait and mobility: Secondary | ICD-10-CM

## 2022-12-17 MED ORDER — CLOPIDOGREL BISULFATE 75 MG PO TABS
75.0000 mg | ORAL_TABLET | Freq: Every day | ORAL | 11 refills | Status: DC
  Filled 2022-12-17 – 2022-12-18 (×3): qty 30, 30d supply, fill #0

## 2022-12-17 MED ORDER — CLOPIDOGREL BISULFATE 75 MG PO TABS: 75.0000 mg | ORAL_TABLET | Freq: Every day | ORAL | 11 refills | Status: DC

## 2022-12-17 NOTE — Patient Instructions (Addendum)
You had a stroke in the right paramedian pontine location and left frontal area of your brain   Continue working with PT for hopeful ongoing recovery  Restart clopidogrel 75 mg daily  and continue Crestor for secondary stroke prevention Stop aspirin once you restart Plavix   Highly encourage complete tobacco cessation and avoidance of cocaine use as continued use greatly increases risk of additional strokes  Continue to follow up with PCP regarding blood pressure and cholesterol management  Maintain strict control of hypertension with blood pressure goal below 130/90 and cholesterol with LDL cholesterol (bad cholesterol) goal below 70 mg/dL.   Signs of a Stroke? Follow the BEFAST method:  Balance Watch for a sudden loss of balance, trouble with coordination or vertigo Eyes Is there a sudden loss of vision in one or both eyes? Or double vision?  Face: Ask the person to smile. Does one side of the face droop or is it numb?  Arms: Ask the person to raise both arms. Does one arm drift downward? Is there weakness or numbness of a leg? Speech: Ask the person to repeat a simple phrase. Does the speech sound slurred/strange? Is the person confused ? Time: If you observe any of these signs, call 911.      Thank you for coming to see [place] at [facility]. I hope we have been able to provide you high quality care today.  You may receive a patient satisfaction survey over the next few weeks. We would appreciate your feedback and comments so that we may continue to improve ourselves and the health of our patients.    Stroke Prevention Some medical conditions and lifestyle choices can lead to a higher risk for a stroke. You can help to prevent a stroke by eating healthy foods and exercising. It also helps to not smoke and to manage any health problems you may have. How can this condition affect me? A stroke is an emergency. It should be treated right away. A stroke can lead to  brain damage or threaten your life. There is a better chance of surviving and getting better after a stroke if you get medical help right away. What can increase my risk? The following medical conditions may increase your risk of a stroke: Diseases of the heart and blood vessels (cardiovascular disease). High blood pressure (hypertension). Diabetes. High cholesterol. Sickle cell disease. Problems with blood clotting. Being very overweight. Sleeping problems (obstructivesleep apnea). Other risk factors include: Being older than age 35. A history of blood clots, stroke, or mini-stroke (TIA). Race, ethnic background, or a family history of stroke. Smoking or using tobacco products. Taking birth control pills, especially if you smoke. Heavy alcohol and drug use. Not being active. What actions can I take to prevent this? Manage your health conditions High cholesterol. Eat a healthy diet. If this is not enough to manage your cholesterol, you may need to take medicines. Take medicines as told by your doctor. High blood pressure. Try to keep your blood pressure below 130/80. If your blood pressure cannot be managed through a healthy diet and regular exercise, you may need to take medicines. Take medicines as told by your doctor. Ask your doctor if you should check your blood pressure at home. Have your blood pressure checked every year. Diabetes. Eat a healthy diet and get regular exercise. If your blood sugar (glucose) cannot be managed through diet and exercise, you may need to take medicines. Take medicines as told by your doctor. Talk to  your doctor about getting checked for sleeping problems. Signs of a problem can include: Snoring a lot. Feeling very tired. Make sure that you manage any other conditions you have. Nutrition  Follow instructions from your doctor about what to eat or drink. You may be told to: Eat and drink fewer calories each day. Limit how much salt (sodium) you  use to 1,500 milligrams (mg) each day. Use only healthy fats for cooking, such as olive oil, canola oil, and sunflower oil. Eat healthy foods. To do this: Choose foods that are high in fiber. These include whole grains, and fresh fruits and vegetables. Eat at least 5 servings of fruits and vegetables a day. Try to fill one-half of your plate with fruits and vegetables at each meal. Choose low-fat (lean) proteins. These include low-fat cuts of meat, chicken without skin, fish, tofu, beans, and nuts. Eat low-fat dairy products. Avoid foods that: Are high in salt. Have saturated fat. Have trans fat. Have cholesterol. Are processed or pre-made. Count how many carbohydrates you eat and drink each day. Lifestyle If you drink alcohol: Limit how much you have to: 0-1 drink a day for women who are not pregnant. 0-2 drinks a day for men. Know how much alcohol is in your drink. In the [place] one drink equals one 12 oz bottle of beer (338m), one 5 oz glass of wine (1456m, or one 1 oz glass of hard liquor (4462m Do not smoke or use any products that have nicotine or tobacco. If you need help quitting, ask your doctor. Avoid secondhand smoke. Do not use drugs. Activity  Try to stay at a healthy weight. Get at least 30 minutes of exercise on most days, such as: Fast walking. Biking. Swimming. Medicines Take over-the-counter and prescription medicines only as told by your doctor. Avoid taking birth control pills. Talk to your doctor about the risks of taking birth control pills if: You are over 35 10ars old. You smoke. You get very bad headaches. You have had a blood clot. Where to find more information American Stroke Association: www.strokeassociation.org Get help right away if: You or a loved one has any signs of a stroke. "BE FAST" is an easy way to remember the warning signs: B - Balance. Dizziness, sudden trouble walking, or loss of balance. E - Eyes. Trouble seeing or a change in  how you see. F - Face. Sudden weakness or loss of feeling of the face. The face or eyelid may droop on one side. A - Arms. Weakness or loss of feeling in an arm. This happens all of a sudden and most often on one side of the body. S - Speech. Sudden trouble speaking, slurred speech, or trouble understanding what people say. T - Time. Time to call emergency services. Write down what time symptoms started. You or a loved one has other signs of a stroke, such as: A sudden, very bad headache with no known cause. Feeling like you may vomit (nausea). Vomiting. A seizure. These symptoms may be an emergency. Get help right away. Call your local emergency services (911 in the [place] Do not wait to see if the symptoms will go away. Do not drive yourself to the hospital. Summary You can help to prevent a stroke by eating healthy, exercising, and not smoking. It also helps to manage any health problems you have. Do not smoke or use any products that contain nicotine or tobacco. Get help right away if you or a loved one has any signs  of a stroke. This information is not intended to replace advice given to you by your health care provider. Make sure you discuss any questions you have with your health care provider. Document Revised: 06/05/2020 Document Reviewed: 06/05/2020 Elsevier Patient Education  [place].

## 2022-12-18 ENCOUNTER — Encounter: Payer: Self-pay | Admitting: Physical Therapy

## 2022-12-18 ENCOUNTER — Ambulatory Visit: Payer: Medicaid Other | Admitting: Occupational Therapy

## 2022-12-18 ENCOUNTER — Ambulatory Visit: Payer: Medicaid Other | Admitting: Physical Therapy

## 2022-12-18 ENCOUNTER — Encounter: Payer: Self-pay | Admitting: Occupational Therapy

## 2022-12-18 ENCOUNTER — Other Ambulatory Visit (HOSPITAL_COMMUNITY): Payer: Self-pay

## 2022-12-18 VITALS — BP 135/87 | HR 78

## 2022-12-18 DIAGNOSIS — R41840 Attention and concentration deficit: Secondary | ICD-10-CM

## 2022-12-18 DIAGNOSIS — M6281 Muscle weakness (generalized): Secondary | ICD-10-CM

## 2022-12-18 DIAGNOSIS — Z9181 History of falling: Secondary | ICD-10-CM | POA: Diagnosis not present

## 2022-12-18 DIAGNOSIS — R278 Other lack of coordination: Secondary | ICD-10-CM | POA: Diagnosis not present

## 2022-12-18 DIAGNOSIS — R2681 Unsteadiness on feet: Secondary | ICD-10-CM

## 2022-12-18 DIAGNOSIS — R2689 Other abnormalities of gait and mobility: Secondary | ICD-10-CM

## 2022-12-18 DIAGNOSIS — I639 Cerebral infarction, unspecified: Secondary | ICD-10-CM | POA: Diagnosis not present

## 2022-12-18 NOTE — Therapy (Addendum)
OUTPATIENT OCCUPATIONAL THERAPY NEURO EVALUATION  Patient Name: Theresa Watkins MRN: [id number] DOB:1966/04/19, 57 y.o., female Today's Date: 12/18/2022  PCP: Ladell Pier, MD     REFERRING PROVIDER:  Little Ishikawa, MD  END OF SESSION:  OT End of Session - 12/18/22 1617     Visit Number 1    Number of Visits 9    Date for OT Re-Evaluation 02/14/23    Authorization Type [place] Medicaid - requires auth    OT Start Time 1618    OT Stop Time 1656    OT Time Calculation (min) 38 min    Activity Tolerance Patient tolerated treatment well    Behavior During Therapy [facility] for tasks assessed/performed             Past Medical History:  Diagnosis Date   Abnormal Pap smear    Bronchitis    Fibroid    Headache(784.0)    Ovarian cyst    Plantar fasciitis    Seizures ([place])    Stroke ([place])    Thyroid cancer ([place]) [address]   Trichomonas    Urinary tract infection    Past Surgical History:  Procedure Laterality Date   ABDOMINAL HYSTERECTOMY     CESAREAN SECTION     goiter     removed   laparoscopic  1988   removal  of ectopic preg, ruptured tube   THERAPEUTIC ABORTION     THYROIDECTOMY  march 2011   cancer   Patient Active Problem List   Diagnosis Date Noted   Prediabetes 12/12/2022   Acute CVA (cerebrovascular accident) ([place]) 11/10/2022   Acute ischemic stroke ([place]) 11/09/2022   Chondromalacia patellae, 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   Unilateral primary osteoarthritis, left hip 09/25/2021   Left foot pain 08/26/2019   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   Tobacco dependence 04/09/2019   Vitamin D deficiency 02/18/2018   Toe deformity, acquired, right 02/18/2018   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   Hyperlipemia 03/21/2017   Headache 03/21/2017   Tooth pain    CVA (cerebral infarction) 12/27/2015   HLD (hyperlipidemia)    Hemiparesis affecting dominant side as late effect of stroke ([place])    Tobacco abuse    Cocaine use  disorder ([place])    ETOH abuse    Postoperative hypothyroidism 12/24/2015   Drug abuse, cocaine type ([place]) 05/09/2014   Hx of thyroid cancer 01/25/2014   Essential hypertension 06/03/2012    ONSET DATE: 11/14/2022   REFERRING DIAG: I63.9 (ICD-10-CM) - Cerebrovascular accident (CVA), unspecified mechanism ([place]) I63.9 (ICD-10-CM) - Acute CVA (cerebrovascular accident) ([place])  THERAPY DIAG:  Other lack of coordination  Muscle weakness (generalized)  Attention and concentration deficit  Rationale for Evaluation and Treatment: Rehabilitation  SUBJECTIVE:   SUBJECTIVE STATEMENT: Pt reports pain in L upper arm and shoulder which improves after taking an Aspirin. She has experienced this pain since her stroke, which has also worsened due to her numerous falls. She reports increased difficulty with her memory since her stroke. Sometimes she is unsure if she takes her morning medications, so she takes her pills again at night.   Pt accompanied by: self  PERTINENT HISTORY:  Presented on 12/22 with dizziness, left sided weakness/numbness and diplopia + cocaine. 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 PMH includes CVA (2017), HTN, thyroid cancer, cocaine use, seizures, UTI, former EtOH abuse.   PRECAUTIONS: Fall  WEIGHT BEARING RESTRICTIONS: No  PAIN:  Are you having pain? Yes: NPRS scale:  7/10 Pain location: generalized Pain description: everywhere Aggravating factors: moving Relieving factors: rest  FALLS: Has patient fallen in last 6 months? Yes. Number of falls at least 11  LIVING ENVIRONMENT: Lives with: lives alone; mom and son comes over to help her with things  Lives in: House/apartment Stairs: Yes: External: 1 steps; none Has following equipment at home: Single point cane, Walker - 2 wheeled, and shower chair Pt uses Depends so she doesn't have to go to the bathroom as much.   PLOF: Independent; could drive but didn't own a car  PATIENT GOALS: Return to  PLOF  OBJECTIVE:   HAND DOMINANCE: Right  ADLs: Overall ADLs: mod I reported though safety concerns given presentation in clinic Equipment: Shower seat with back  IADLs: Shopping: at prior level Light housekeeping: mod A Meal Prep:not at prior level but reports ability to cook light stovetop meals Community mobility: dependent Medication management: independent (though is not taking correctly given pt report) Financial management: reports independence Handwriting: 25% legible  MOBILITY STATUS: Needs Assist: min A; defers use of AD/does not own a cane  ACTIVITY TOLERANCE: Activity tolerance: fair given pain and balance  FUNCTIONAL OUTCOME MEASURES: FOTO: 55 (moderate severity)    UPPER EXTREMITY ROM:    BUE WFL though pt reports increase in L shoulder pain  UPPER EXTREMITY MMT:     BUE BFL: grossly 3+/5 throughout  HAND FUNCTION: Grip strength: Right: 32.8 lbs; Left: 21.8 lbs  COORDINATION: 9 Hole Peg test: Right: 36 sec; Left: 39 sec  SENSATION: Denies numbness or tingling  EDEMA: none  MUSCLE TONE: WFL  COGNITION: Overall cognitive status: Impaired - memory, problem solving, processing, attention deficits noted throughout session  VISION: Subjective report: vision has been better since stroke; occasional double vision Baseline vision: Wears glasses for reading only and OTC readers Visual history:  n/a  VISION ASSESSMENT: WFL Will continue to assess as needed  PERCEPTION: WFL  PRAXIS: WFL  OBSERVATIONS: Pt had LOB ambulating from table to lobby without use of AD. Pt's shoe laces were untied and required assistance from therapist for safety. Pt had glasses donned and appears well-kept.    TODAY'S TREATMENT:                                                                                                                              N/a  PATIENT EDUCATION: Education details: OT POC; Role of OT and ST; use of heat and stretches for L shoulder  pain Person educated: Patient Education method: Explanation Education comprehension: verbalized understanding and needs further education  HOME EXERCISE PROGRAM: N/a   GOALS:  SHORT TERM GOALS: Target date: 01/15/2023   Patient will demonstrate BUE HEP with 25% verbal cues or less for proper execution.  Baseline: not yet initiated Goal status: INITIAL  2.  Patient will complete nine-hole peg with use of each hand in 32 seconds or less.  Baseline: Right: 36 sec; Left: 39 sec Goal status:  INITIAL  LONG TERM GOALS: Target date: 02/14/2023    Patient will complete updated BUE HEP with 25% verbal cues or less for proper execution.  Baseline: not yet initiated Goal status: INITIAL  2.  Pt will demonstrate ability to setup pill box with 0 errors.   Baseline: Pt reports she is not taking medications correctly. Goal status: INITIAL  3.  Patient will demonstrate at least 30 lbs L grip strength as needed to open jars and other containers.  Baseline: 21.8 lbs L grip strength Goal status: INITIAL  4.  Pt will improve FOTO score to mild severity or better.   Baseline: moderate severity Goal status: INITIAL   ASSESSMENT:  CLINICAL IMPRESSION: Patient is a 57 y.o. female who was seen today for occupational therapy evaluation for CVA. Hx includes thyroid cancer, seizures, stroke, OT R shoulder, prior strokes, HLD, tobacco abuse, cocaine abuse, ETOH abuse, and HTN. Patient currently presents below baseline level of functioning demonstrating functional deficits and impairments as noted below. Pt would benefit from skilled OT services in the outpatient setting to work on impairments as noted below to help pt return to PLOF as able.    PERFORMANCE DEFICITS: in functional skills including ADLs, IADLs, coordination, strength, pain, Fine motor control, mobility, balance, endurance, decreased knowledge of precautions, and UE functional use, cognitive skills including attention, memory,  problem solving, and safety awareness.  IMPAIRMENTS: are limiting patient from ADLs, IADLs, and leisure.   CO-MORBIDITIES: may have co-morbidities  that affects occupational performance. Patient will benefit from skilled OT to address above impairments and improve overall function.  MODIFICATION OR ASSISTANCE TO COMPLETE EVALUATION: Min-Moderate modification of tasks or assist with assess necessary to complete an evaluation.  OT OCCUPATIONAL PROFILE AND HISTORY: Problem focused assessment: Including review of records relating to presenting problem.  CLINICAL DECISION MAKING: LOW - limited treatment options, no task modification necessary  REHAB POTENTIAL: Fair given insight to safety  EVALUATION COMPLEXITY: Low    PLAN:  OT FREQUENCY: 1x/week  OT DURATION: 8 weeks  PLANNED INTERVENTIONS: self care/ADL training, therapeutic exercise, therapeutic activity, neuromuscular re-education, manual therapy, passive range of motion, balance training, functional mobility training, splinting, electrical stimulation, ultrasound, paraffin, fluidotherapy, moist heat, contrast bath, patient/family education, cognitive remediation/compensation, visual/perceptual remediation/compensation, DME and/or AE instructions, and Re-evaluation  RECOMMENDED OTHER SERVICES: ST for assessment of cognition - referral requested through [facility]  CONSULTED AND AGREED WITH PLAN OF CARE: Patient  PLAN FOR NEXT SESSION: [place] HEP with primary focus on LUE; ADLs/home safety  Check all possible CPT codes: [phone] - OT Re-evaluation, 97110- Therapeutic Exercise, [phone]- Neuro Re-education, 97140 - Manual Therapy, 97530 - Therapeutic Activities, 97535 - Self Care, and [phone] - Ultrasound    Check all conditions that are expected to impact treatment: Cognitive impairment, Neurological condition, and Social determinants of health   If treatment provided at initial evaluation, no treatment charged due to lack of authorization.         Dennis Bast, OT 12/18/2022, 5:43 PM

## 2022-12-18 NOTE — Progress Notes (Signed)
I agree with the above plan 

## 2022-12-18 NOTE — Therapy (Addendum)
OUTPATIENT PHYSICAL THERAPY NEURO TREATMENT   Patient Name: Theresa Watkins MRN: [id number] DOB:12-01-1965, 57 y.o., female Today's Date: 12/18/2022   PCP: Ladell Pier, MD REFERRING PROVIDER:  Little Ishikawa, MD  END OF SESSION:  PT End of Session - 12/18/22 1535     Visit Number 3    Number of Visits 7    Date for PT Re-Evaluation 01/28/23    Authorization Type Medicaid [employer] [place] - 13 visits approved 12/02/22 - 03/02/23    PT Start Time 1533    PT Stop Time 1062    PT Time Calculation (min) 42 min    Equipment Utilized During Treatment Gait belt    Activity Tolerance Patient tolerated treatment well    Behavior During Therapy WFL for tasks assessed/performed             Past Medical History:  Diagnosis Date   Abnormal Pap smear    Bronchitis    Fibroid    Headache(784.0)    Ovarian cyst    Plantar fasciitis    Seizures ([place])    Stroke ([place])    Thyroid cancer ([place]) [address]11   Trichomonas    Urinary tract infection    Past Surgical History:  Procedure Laterality Date   ABDOMINAL HYSTERECTOMY     CESAREAN SECTION     goiter     removed   laparoscopic  1988   removal  of ectopic preg, ruptured tube   THERAPEUTIC ABORTION     THYROIDECTOMY  march 2011   cancer   Patient Active Problem List   Diagnosis Date Noted   Prediabetes 12/12/2022   Acute CVA (cerebrovascular accident) ([place]) 11/10/2022   Acute ischemic stroke ([place]) 11/09/2022   Chondromalacia patellae, 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   Unilateral primary osteoarthritis, left hip 09/25/2021   Left foot pain 08/26/2019   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   Tobacco dependence 04/09/2019   Vitamin D deficiency 02/18/2018   Toe deformity, acquired, right 02/18/2018   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   Hyperlipemia 03/21/2017   Headache 03/21/2017   Tooth pain    CVA (cerebral infarction) 12/27/2015   HLD (hyperlipidemia)    Hemiparesis affecting dominant  side as late effect of stroke ([place])    Tobacco abuse    Cocaine use disorder ([place])    ETOH abuse    Postoperative hypothyroidism 12/24/2015   Drug abuse, cocaine type ([place]) 05/09/2014   Hx of thyroid cancer 01/25/2014   Essential hypertension 06/03/2012    ONSET DATE: 11/14/2022  REFERRING DIAG: I63.9 (ICD-10-CM) - Cerebrovascular accident (CVA), unspecified mechanism ([place]) I63.9 (ICD-10-CM) - Acute CVA (cerebrovascular accident) ([place])  THERAPY DIAG:  Unsteadiness on feet  History of falling  Other abnormalities of gait and mobility  Muscle weakness (generalized)  Rationale for Evaluation and Treatment: Rehabilitation  SUBJECTIVE:  SUBJECTIVE STATEMENT: Had a fall since she was last here. Went to bend down and was too close to the couch. Was able to get up on her own.  Reports legs feel weaker today. Feels like her buttocks is bruised. Reports exercises are going well at home.   Pt accompanied by: self  PERTINENT HISTORY: Presented on 12/22 with dizziness, left sided weakness/numbness and diplopia + cocaine. 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 PMH includes CVA (2017), HTN, thyroid cancer, cocaine use, seizures, UTI, former EtOH abuse.   PAIN:  Are you having pain?5/10 R buttock pain due to soreness after fall.   Vitals:   12/18/22 1537  BP: 135/87  Pulse: 78     PRECAUTIONS: Fall  WEIGHT BEARING RESTRICTIONS: No  FALLS: Has patient fallen in last 6 months? Yes. Number of falls 10  LIVING ENVIRONMENT: Lives with: lives alone, mom and son comes over to help her with things  Lives in: House/apartment Stairs: Yes: External: 1 steps; none Has following equipment at home: Single point cane, Walker - 2 wheeled, and shower chair Pt uses Depends so she doesn't have to go  to the bathroom as much.   PLOF: Independent  PATIENT GOALS: Wants to have better balance.   OBJECTIVE:   DIAGNOSTIC FINDINGS:  Imaging on 11/09/22:   IMPRESSION: MRI HEAD IMPRESSION:   1. 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 2. Additional subcentimeter acute ischemic nonhemorrhagic cortical infarct involving the left frontal lobe. 3. Underlying chronic microvascular ischemic disease with multiple additional remote lacunar infarcts as above. Overall, appearance is mildly progressed as compared to previous MRI from 09/05/2020. 4. Few scattered chronic micro hemorrhages, most characteristic of poorly controlled hypertension.   MRA HEAD IMPRESSION:   1. Negative intracranial MRA for large vessel occlusion. 2. Severe left A2/A3 stenosis, similar compared to previous MRA from 09/05/2020. 3. Moderate to severe contralateral right A2/A3 stenosis, progressed from prior. 4. Interval improvement in previously seen severe left M1 stenosis, now no more than mild in nature. 5. Distal small vessel atheromatous irregularity.   MRA NECK IMPRESSION:   Continued wide patency of both carotid artery systems and vertebral arteries within the neck. No hemodynamically significant stenosis or other acute vascular abnormality.   TODAY'S TREATMENT:                                                                                                                               GAIT: Gait pattern: step through pattern, decreased step length- Right, decreased step length- Left, decreased stride length, and narrow BOS,staggering to the L  Distance walked: Clinic distances  Assistive device utilized: None Level of assistance: CGA Comments: Pt with bilat toe in (pt reports she has always walked like this), staggering to the L.   Performed 55' with SPC with supervision/CGA initially, with initial verbal and demo cues for proper technique and sequencing. Cued to place  cane out wider for improved  BOS. When pt gets distracted, tends to get off sequence. Cued to focus on the task of walking vs. Conversing with therapist. Performed an additional 47' with cane with focus on sequencing and on the task at hand. Pt is very easily distracted and still stops and talks to therapist throughout bout of gait.   Trialed 2' with SPC with quad tip, pt with more difficulty sequencing this one and not fully placing it on the floor. Pt placing it too close to her feet as well. Pt prefers using SPC.   NMR: Standing in corner on level ground.  Wide BOS EC 4 x 15-20 seconds, mild postural sway With feet hip width distance: 10 reps head turns, 10 reps head nods Tandem stance 2 x 30 seconds bilat, intermittent taps for balance as needed     PATIENT EDUCATION: Education details: Educated on where to purchase a cane and importance of using an AD to decr fall risk (pt was educated in the past to use RW, but has not been using), new additions to HEP, pt asking about what medications she is on (reviewed this from medication list and Jessica McCue's note)  Person educated: Patient Education method: Explanation, Demonstration, Verbal cues, and Handouts Education comprehension: verbalized understanding, returned demonstration, and needs further education  HOME EXERCISE PROGRAM: Access Code: [email or web address] URL: [email or web address] Date: 12/18/2022 Prepared by: Janann August  Exercises - Sit to Stand Without Arm Support  - 1 x daily - 7 x weekly - 3 sets - 10 reps - Standing March with Counter Support  - 1 x daily - 7 x weekly - 3 sets - 10 reps - Standing Balance in Corner with Eyes Closed  - 1 x daily - 7 x weekly - 3 sets - 15-20 hold - Standing Tandem Balance with Unilateral Counter Support  - 1 x daily - 7 x weekly - 3 sets - 10 reps  GOALS: Goals reviewed with patient? Yes  SHORT TERM GOALS: Target date: 12/20/2022  Pt will be independent with initial HEP in  order to build upon functional gains made in therapy. Baseline: Dependent  Goal status: INITIAL  2. Patient will improve Berg Balance score by 5 points to indicate clinically significant progress towards a decreased risk of falls and improved static stability.   Baseline: 27/56 Goal status: INITIAL  3.  Pt will improve 5x sit<>stand to less than or equal to 17 sec to demonstrate improved functional strength and transfer efficiency.  Baseline: 19.56 seconds without UE support, reports incr fatigue afterwards Goal status: INITIAL  4. Pt will improve gait speed with no AD vs. LRAD to at least 2.6 ft/sec in order to demo improved community mobility. Baseline: 14.44 seconds with no AD = 2.27 ft/sec  Goal status: INITIAL   LONG TERM GOALS: Target date: 01/10/2023  Pt will be independent with final HEP in order to build upon functional gains made in therapy. Baseline: Dependent Goal status: INITIAL  2.  Patient will improve Berg Balance score to 45/56 or greater to indicate a decreased risk of falls and improved static stability.   Baseline: 27/56 Goal status: INITIAL  3.  Pt will improve gait speed with no AD vs. LRAD to at least 2.9 ft/sec in order to demo improved community mobility. Baseline: 14.44 seconds with no AD = 2.27 ft/sec  Goal status: INITIAL  4.  Pt will improve 5x sit<>stand to less than or equal to 14.5 sec to demonstrate improved functional strength and transfer efficiency.  Baseline: 19.56  seconds without UE support, reports incr fatigue afterwards Goal status: INITIAL  5.  Pt will ambulate at least 500' outdoors over paved surfaces with LRAD in order to demo improved community mobility.  Baseline: Not yet assessed.  Goal status: INITIAL  6.  Pt will improve FOTO to at least 60 to demo improved functional outcomes.  Baseline: 45 Goal status: INITIAL  ASSESSMENT:  CLINICAL IMPRESSION: Today's skilled session focused on gait training with SPC vs. SPC with quad  tip to improve safety with gait and to decr pt's risk of falls. Pt needing initial cues for proper sequencing and widened BOS as pt with tendency to keep cane too close to BOS. Pt improved when she focuses on the task at hand, but pt can get easily distracted despite cues from therapist to focus on walking. With incr distance, pt able to ambulate with cane with supervision. Educated on where pt can purchase a cane. Added to pt's HEP for balance with working on balance with EC and narrow BOS. Pt needing intermittent rest breaks during session due to fatigue. Will continue to progress towards LTGs.   OBJECTIVE IMPAIRMENTS: Abnormal gait, decreased activity tolerance, decreased balance, decreased cognition, decreased coordination, decreased endurance, decreased knowledge of condition, decreased knowledge of use of DME, decreased mobility, difficulty walking, decreased strength, decreased safety awareness, impaired sensation, impaired vision/preception, postural dysfunction, and pain.   ACTIVITY LIMITATIONS: carrying, bending, stairs, transfers, bathing, toileting, and locomotion level  PARTICIPATION LIMITATIONS: meal prep, cleaning, driving, shopping, and community activity  PERSONAL FACTORS: Age, Behavior pattern, Past/current experiences, Time since onset of injury/illness/exacerbation, and 3+ comorbidities: CVA (2017), HTN, thyroid cancer, cocaine use, seizures, UTI, former EtOH abuse.   are also affecting patient's functional outcome.   REHAB POTENTIAL: Good  CLINICAL DECISION MAKING: Evolving/moderate complexity  EVALUATION COMPLEXITY: Moderate  PLAN:  PT FREQUENCY: 1x/week  PT DURATION: 8 weeks  PLANNED INTERVENTIONS: Therapeutic exercises, Therapeutic activity, Neuromuscular re-education, Balance training, Gait training, Patient/Family education, Self Care, Stair training, Vestibular training, DME instructions, and Re-evaluation  PLAN FOR NEXT SESSION: add to HEP as appropriate. Check  STGs. Work on Personnel officer with cane to increase safety/mobility in the home. Continue to work on LLE strengthening and balance.   Arliss Journey, PT, DPT  12/18/2022, 5:29 PM  Check all possible CPT codes: [phone] - PT Re-evaluation, 97110- Therapeutic Exercise, [phone]- Neuro Re-education, [phone] - Gait Training, [phone] - Therapeutic Activities, [phone] - Self Care, and [phone] - Orthotic Fit    Check all conditions that are expected to impact treatment: Neurological condition  If treatment provided at initial evaluation, no treatment charged due to lack of authorization.

## 2022-12-19 ENCOUNTER — Other Ambulatory Visit: Payer: Self-pay | Admitting: Adult Health

## 2022-12-19 ENCOUNTER — Ambulatory Visit: Payer: Medicaid Other | Admitting: Physical Therapy

## 2022-12-19 ENCOUNTER — Ambulatory Visit: Payer: Medicaid Other | Admitting: Occupational Therapy

## 2022-12-19 DIAGNOSIS — I69319 Unspecified symptoms and signs involving cognitive functions following cerebral infarction: Secondary | ICD-10-CM

## 2022-12-19 NOTE — Progress Notes (Signed)
Neuro rehab OT requesting evaluation by SLP for concerns of cognitive impairment post stroke. Orders placed as requested.

## 2022-12-25 ENCOUNTER — Ambulatory Visit: Payer: Medicaid Other | Admitting: Occupational Therapy

## 2022-12-25 ENCOUNTER — Ambulatory Visit: Payer: Medicaid Other | Admitting: Physical Therapy

## 2022-12-25 NOTE — Therapy (Deleted)
OUTPATIENT OCCUPATIONAL THERAPY NEURO EVALUATION  Patient Name: Theresa Watkins MRN: [id number] DOB:[birth year], 57 y.o., female Today's Date: 12/25/2022  PCP: Ladell Pier, MD     REFERRING PROVIDER:  Little Ishikawa, MD  END OF SESSION:    Past Medical History:  Diagnosis Date   Abnormal Pap smear    Bronchitis    Fibroid    Headache(784.0)    Ovarian cyst    Plantar fasciitis    Seizures ([place])    Stroke [facility])    Thyroid cancer ([place]) [address]   Trichomonas    Urinary tract infection    Past Surgical History:  Procedure Laterality Date   ABDOMINAL HYSTERECTOMY     CESAREAN SECTION     goiter     removed   laparoscopic  1988   removal  of ectopic preg, ruptured tube   THERAPEUTIC ABORTION     THYROIDECTOMY  march 2011   cancer   Patient Active Problem List   Diagnosis Date Noted   Prediabetes 12/12/2022   Acute CVA (cerebrovascular accident) ([place]) 11/10/2022   Acute ischemic stroke ([place]) 11/09/2022   Chondromalacia patellae, 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   Unilateral primary osteoarthritis, left hip 09/25/2021   Left foot pain 08/26/2019   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   Tobacco dependence 04/09/2019   Vitamin D deficiency 02/18/2018   Toe deformity, acquired, right 02/18/2018   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   Hyperlipemia 03/21/2017   Headache 03/21/2017   Tooth pain    CVA (cerebral infarction) 12/27/2015   HLD (hyperlipidemia)    Hemiparesis affecting dominant side as late effect of stroke ([place])    Tobacco abuse    Cocaine use disorder ([place])    ETOH abuse    Postoperative hypothyroidism 12/24/2015   Drug abuse, cocaine type ([place]) 05/09/2014   Hx of thyroid cancer 01/25/2014   Essential hypertension 06/03/2012    ONSET DATE: 11/14/2022   REFERRING DIAG: I63.9 (ICD-10-CM) - Cerebrovascular accident (CVA), unspecified mechanism ([place]) I63.9 (ICD-10-CM) - Acute CVA (cerebrovascular accident)  ([place])  THERAPY DIAG:  No diagnosis found.  Rationale for Evaluation and Treatment: Rehabilitation  SUBJECTIVE:   SUBJECTIVE STATEMENT: Pt reports pain in L upper arm and shoulder which improves after taking an Aspirin. She has experienced this pain since her stroke, which has also worsened due to her numerous falls. She reports increased difficulty with her memory since her stroke. Sometimes she is unsure if she takes her morning medications, so she takes her pills again at night.   Pt accompanied by: self  PERTINENT HISTORY:  Presented on 12/22 with dizziness, left sided weakness/numbness and diplopia + cocaine. 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 PMH includes CVA (2017), HTN, thyroid cancer, cocaine use, seizures, UTI, former EtOH abuse.   PRECAUTIONS: Fall  WEIGHT BEARING RESTRICTIONS: No  PAIN:  Are you having pain? Yes: NPRS scale: 7/10 Pain location: generalized Pain description: everywhere Aggravating factors: moving Relieving factors: rest  FALLS: Has patient fallen in last 6 months? Yes. Number of falls at least 11  LIVING ENVIRONMENT: Lives with: lives alone; mom and son comes over to help her with things  Lives in: House/apartment Stairs: Yes: External: 1 steps; none Has following equipment at home: Single point cane, Walker - 2 wheeled, and shower chair Pt uses Depends so she doesn't have to go to the bathroom as much.   PLOF: Independent; could drive but didn't own a car  PATIENT GOALS: Return to  PLOF  OBJECTIVE:   HAND DOMINANCE: Right  ADLs: Overall ADLs: mod I reported though safety concerns given presentation in clinic Equipment: Shower seat with back  IADLs: Shopping: at prior level Light housekeeping: mod A Meal Prep:not at prior level but reports ability to cook light stovetop meals Community mobility: dependent Medication management: independent (though is not taking correctly given pt report) Financial management: reports  independence Handwriting: 25% legible  MOBILITY STATUS: Needs Assist: min A; defers use of AD/does not own a cane  ACTIVITY TOLERANCE: Activity tolerance: fair given pain and balance  FUNCTIONAL OUTCOME MEASURES: FOTO: 55 (moderate severity)    UPPER EXTREMITY ROM:    BUE WFL though pt reports increase in L shoulder pain  UPPER EXTREMITY MMT:     BUE BFL: grossly 3+/5 throughout  HAND FUNCTION: Grip strength: Right: 32.8 lbs; Left: 21.8 lbs  COORDINATION: 9 Hole Peg test: Right: 36 sec; Left: 39 sec  SENSATION: Denies numbness or tingling  EDEMA: none  MUSCLE TONE: WFL  COGNITION: Overall cognitive status: Impaired - memory, problem solving, processing, attention deficits noted throughout session  VISION: Subjective report: vision has been better since stroke; occasional double vision Baseline vision: Wears glasses for reading only and OTC readers Visual history:  n/a  VISION ASSESSMENT: WFL Will continue to assess as needed  PERCEPTION: WFL  PRAXIS: WFL  OBSERVATIONS: Pt had LOB ambulating from table to lobby without use of AD. Pt's shoe laces were untied and required assistance from therapist for safety. Pt had glasses donned and appears well-kept.    TODAY'S TREATMENT:                                                                                                                              N/a  PATIENT EDUCATION: Education details: OT POC; Role of OT and ST; use of heat and stretches for L shoulder pain Person educated: Patient Education method: Explanation Education comprehension: verbalized understanding and needs further education  HOME EXERCISE PROGRAM: N/a   GOALS:  SHORT TERM GOALS: Target date: 01/15/2023   Patient will demonstrate BUE HEP with 25% verbal cues or less for proper execution.  Baseline: not yet initiated Goal status: INITIAL  2.  Patient will complete nine-hole peg with use of each hand in 32 seconds or  less.  Baseline: Right: 36 sec; Left: 39 sec Goal status: INITIAL  LONG TERM GOALS: Target date: 02/14/2023    Patient will complete updated BUE HEP with 25% verbal cues or less for proper execution.  Baseline: not yet initiated Goal status: INITIAL  2.  Pt will demonstrate ability to setup pill box with 0 errors.   Baseline: Pt reports she is not taking medications correctly. Goal status: INITIAL  3.  Patient will demonstrate at least 30 lbs L grip strength as needed to open jars and other containers.  Baseline: 21.8 lbs L grip strength Goal status: INITIAL  4.  Pt will improve FOTO score  to mild severity or better.   Baseline: moderate severity Goal status: INITIAL   ASSESSMENT:  CLINICAL IMPRESSION: Patient is a 57 y.o. female who was seen today for occupational therapy evaluation for CVA. Hx includes thyroid cancer, seizures, stroke, OT R shoulder, prior strokes, HLD, tobacco abuse, cocaine abuse, ETOH abuse, and HTN. Patient currently presents below baseline level of functioning demonstrating functional deficits and impairments as noted below. Pt would benefit from skilled OT services in the outpatient setting to work on impairments as noted below to help pt return to PLOF as able.    PERFORMANCE DEFICITS: in functional skills including ADLs, IADLs, coordination, strength, pain, Fine motor control, mobility, balance, endurance, decreased knowledge of precautions, and UE functional use, cognitive skills including attention, memory, problem solving, and safety awareness.  IMPAIRMENTS: are limiting patient from ADLs, IADLs, and leisure.   CO-MORBIDITIES: may have co-morbidities  that affects occupational performance. Patient will benefit from skilled OT to address above impairments and improve overall function.  MODIFICATION OR ASSISTANCE TO COMPLETE EVALUATION: Min-Moderate modification of tasks or assist with assess necessary to complete an evaluation.  OT OCCUPATIONAL  PROFILE AND HISTORY: Problem focused assessment: Including review of records relating to presenting problem.  CLINICAL DECISION MAKING: LOW - limited treatment options, no task modification necessary  REHAB POTENTIAL: Fair given insight to safety  EVALUATION COMPLEXITY: Low    PLAN:  OT FREQUENCY: 1x/week  OT DURATION: 8 weeks  PLANNED INTERVENTIONS: self care/ADL training, therapeutic exercise, therapeutic activity, neuromuscular re-education, manual therapy, passive range of motion, balance training, functional mobility training, splinting, electrical stimulation, ultrasound, paraffin, fluidotherapy, moist heat, contrast bath, patient/family education, cognitive remediation/compensation, visual/perceptual remediation/compensation, DME and/or AE instructions, and Re-evaluation  RECOMMENDED OTHER SERVICES: ST for assessment of cognition - referral requested through [facility] [place]  CONSULTED AND AGREED WITH PLAN OF CARE: Patient  PLAN FOR NEXT SESSION: [place] HEP with primary focus on LUE; ADLs/home safety  Check all possible CPT codes: [phone] - OT Re-evaluation, 97110- Therapeutic Exercise, [phone]- Neuro Re-education, 97140 - Manual Therapy, 97530 - Therapeutic Activities, 97535 - Self Care, and [phone] - Ultrasound    Check all conditions that are expected to impact treatment: Cognitive impairment, Neurological condition, and Social determinants of health   If treatment provided at initial evaluation, no treatment charged due to lack of authorization.        Dennis Bast, OT 12/25/2022, 10:51 AM

## 2022-12-25 NOTE — Therapy (Deleted)
OUTPATIENT SPEECH LANGUAGE PATHOLOGY EVALUATION   Patient Name: Theresa Watkins MRN: [id number] DOB:May 18, 1966, 57 y.o., female Today's Date: 12/25/2022  PCP: Marland Kitchen REFERRING PROVIDER: Frann Rider, NP  END OF SESSION:   Past Medical History:  Diagnosis Date   Abnormal Pap smear    Bronchitis    Fibroid    Headache(784.0)    Ovarian cyst    Plantar fasciitis    Seizures ([place])    Stroke [facility])    Thyroid cancer ([place]) [address]   Trichomonas    Urinary tract infection    Past Surgical History:  Procedure Laterality Date   ABDOMINAL HYSTERECTOMY     CESAREAN SECTION     goiter     removed   laparoscopic  1988   removal  of ectopic preg, ruptured tube   THERAPEUTIC ABORTION     THYROIDECTOMY  march 2011   cancer   Patient Active Problem List   Diagnosis Date Noted   Prediabetes 12/12/2022   Acute CVA (cerebrovascular accident) ([place]) 11/10/2022   Acute ischemic stroke ([place]) 11/09/2022   Chondromalacia patellae, 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   Unilateral primary osteoarthritis, left hip 09/25/2021   Left foot pain 08/26/2019   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   Tobacco dependence 04/09/2019   Vitamin D deficiency 02/18/2018   Toe deformity, acquired, right 02/18/2018   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   Hyperlipemia 03/21/2017   Headache 03/21/2017   Tooth pain    CVA (cerebral infarction) 12/27/2015   HLD (hyperlipidemia)    Hemiparesis affecting dominant side as late effect of stroke ([place])    Tobacco abuse    Cocaine use disorder ([place])    ETOH abuse    Postoperative hypothyroidism 12/24/2015   Drug abuse, cocaine type ([place]) 05/09/2014   Hx of thyroid cancer 01/25/2014   Essential hypertension 06/03/2012    ONSET DATE: ***   REFERRING DIAG: I69.319 (ICD-10-CM) - Cognitive deficit, post-stroke  THERAPY DIAG:  No diagnosis found.  Rationale for Evaluation and Treatment: {HABREHAB:27488}  SUBJECTIVE:   SUBJECTIVE  STATEMENT: *** Pt accompanied by: {accompnied:27141}  PERTINENT HISTORY: Patient is a 57 y/o female who presents on 12/22 with dizziness, left sided weakness/numbness and diploplia.  + cocaine. 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 Pt worked with SLP in 2017 for higher level cognitive deficits, discharging still needing Mod A for safety awareness. PMH includes CVA, HTN, thyroid cancer, cocaine use, seizures, UTI.   PAIN:  Are you having pain? {OPRCPAIN:27236}  FALLS: Has patient fallen in last 6 months?  {UUEKCMKL:49179}  LIVING ENVIRONMENT: Lives with: {OPRC lives with:25569::"lives with their family"} Lives in: {Lives in:25570}  PLOF:  Level of assistance: {XTAVWPV:94801} Employment: {SLPemployment:25674}  PATIENT GOALS: ***  OBJECTIVE:   DIAGNOSTIC FINDINGS: ***  COGNITION: Overall cognitive status: {cognition:24006} Areas of impairment:  {cognitiveimpairmentslp:27409} Functional deficits: ***  COGNITIVE COMMUNICATION: Following directions: {commands:24018}  Auditory comprehension: {WFL-Impaired:25365} Verbal expression: {WFL-Impaired:25365} Functional communication: {WFL-Impaired:25365}  ORAL MOTOR EXAMINATION: Overall status: {OMESLP2:27645} Comments: ***  STANDARDIZED ASSESSMENTS: {SLPstandardizedassessment:27092}  PATIENT REPORTED OUTCOME MEASURES (PROM): {SLPPROM:27095}   TODAY'S TREATMENT:  DATE: ***   PATIENT EDUCATION: Education details: *** Person educated: {Person educated:25204} Education method: {Education Method:25205} Education comprehension: {Education Comprehension:25206}   GOALS: Goals reviewed with patient? {yes/no:20286}  SHORT TERM GOALS: Target date: ***  *** Baseline: Goal status: {GOALSTATUS:25110}  2.  *** Baseline:  Goal status: {GOALSTATUS:25110}  3.  *** Baseline:   Goal status: {GOALSTATUS:25110}  4.  *** Baseline:  Goal status: {GOALSTATUS:25110}  5.  *** Baseline:  Goal status: {GOALSTATUS:25110}  6.  *** Baseline:  Goal status: {GOALSTATUS:25110}  LONG TERM GOALS: Target date: ***  *** Baseline:  Goal status: {GOALSTATUS:25110}  2.  *** Baseline:  Goal status: {GOALSTATUS:25110}  3.  *** Baseline:  Goal status: {GOALSTATUS:25110}  4.  *** Baseline:  Goal status: {GOALSTATUS:25110}  5.  *** Baseline:  Goal status: {GOALSTATUS:25110}  6.  *** Baseline:  Goal status: {GOALSTATUS:25110}  ASSESSMENT:  CLINICAL IMPRESSION: Patient is a 57 y.o. F who was seen today for ***.   OBJECTIVE IMPAIRMENTS: include {SLPOBJIMP:27107}. These impairments are limiting patient from {SLPLIMIT:27108}. Factors affecting potential to achieve goals and functional outcome are {SLP factors:25450}.. Patient will benefit from skilled SLP services to address above impairments and improve overall function.  REHAB POTENTIAL: {rehabpotential:25112}  PLAN:  SLP FREQUENCY: {rehab frequency:25116}  SLP DURATION: {rehab duration:25117}  PLANNED INTERVENTIONS: {SLP treatment/interventions:25449}    Marzetta Board, CCC-SLP 12/25/2022, 11:47 AM

## 2022-12-27 ENCOUNTER — Ambulatory Visit: Payer: Medicaid Other | Attending: Adult Health

## 2022-12-27 DIAGNOSIS — R278 Other lack of coordination: Secondary | ICD-10-CM | POA: Insufficient documentation

## 2022-12-27 DIAGNOSIS — R41841 Cognitive communication deficit: Secondary | ICD-10-CM | POA: Diagnosis not present

## 2022-12-27 DIAGNOSIS — R41840 Attention and concentration deficit: Secondary | ICD-10-CM | POA: Insufficient documentation

## 2022-12-27 DIAGNOSIS — M6281 Muscle weakness (generalized): Secondary | ICD-10-CM | POA: Insufficient documentation

## 2022-12-27 DIAGNOSIS — I69319 Unspecified symptoms and signs involving cognitive functions following cerebral infarction: Secondary | ICD-10-CM | POA: Insufficient documentation

## 2022-12-27 DIAGNOSIS — R471 Dysarthria and anarthria: Secondary | ICD-10-CM | POA: Insufficient documentation

## 2022-12-27 DIAGNOSIS — Z9181 History of falling: Secondary | ICD-10-CM | POA: Diagnosis not present

## 2022-12-27 NOTE — Therapy (Signed)
OUTPATIENT SPEECH LANGUAGE PATHOLOGY EVALUATION   Patient Name: Theresa Watkins MRN: [id number] DOB:[birth year], 57 y.o., female Today's Date: 12/27/2022  PCP: Ladell Pier, MD  REFERRING PROVIDER: Frann Rider, NP   END OF SESSION:  End of Session - 12/27/22 1403     Visit Number 1    Number of Visits 9    Date for SLP Re-Evaluation 02/21/23    Authorization Type [facility] Medicaid    SLP Start Time [id number]    SLP Stop Time  [id number]    SLP Time Calculation (min) 42 min    Activity Tolerance Patient tolerated treatment well             Past Medical History:  Diagnosis Date   Abnormal Pap smear    Bronchitis    Fibroid    Headache(784.0)    Ovarian cyst    Plantar fasciitis    Seizures ([place])    Stroke ([place])    Thyroid cancer ([place]) [address]   Trichomonas    Urinary tract infection    Past Surgical History:  Procedure Laterality Date   ABDOMINAL HYSTERECTOMY     CESAREAN SECTION     goiter     removed   laparoscopic  1988   removal  of ectopic preg, ruptured tube   THERAPEUTIC ABORTION     THYROIDECTOMY  march 2011   cancer   Patient Active Problem List   Diagnosis Date Noted   Prediabetes 12/12/2022   Acute CVA (cerebrovascular accident) ([place]) 11/10/2022   Acute ischemic stroke ([place]) 11/09/2022   Chondromalacia patellae, 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   Unilateral primary osteoarthritis, left hip 09/25/2021   Left foot pain 08/26/2019   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   Tobacco dependence 04/09/2019   Vitamin D deficiency 02/18/2018   Toe deformity, acquired, right 02/18/2018   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   Hyperlipemia 03/21/2017   Headache 03/21/2017   Tooth pain    CVA (cerebral infarction) 12/27/2015   HLD (hyperlipidemia)    Hemiparesis affecting dominant side as late effect of stroke ([place])    Tobacco abuse    Cocaine use disorder ([place])    ETOH abuse    Postoperative hypothyroidism 12/24/2015   Drug  abuse, cocaine type ([place]) 05/09/2014   Hx of thyroid cancer 01/25/2014   Essential hypertension 06/03/2012    ONSET DATE: 11/14/2022     REFERRING DIAG: [id number] (ICD-10-CM) - Cognitive deficit, post-stroke   THERAPY DIAG: Cognitive communication deficit  Dysarthria and anarthria  Rationale for Evaluation and Treatment: Rehabilitation  SUBJECTIVE:   SUBJECTIVE STATEMENT: "I need to tighten up on my speech"  Pt accompanied by: self  PERTINENT HISTORY: Presented on 12/22 with dizziness, left sided weakness/numbness and diplopia + cocaine. 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 PMH includes CVA (2017), HTN, thyroid cancer, cocaine use, seizures, UTI, former EtOH abuse.    PAIN: Are you having pain? Yes: NPRS scale: 7/10 Pain location: left foot  FALLS: Has patient fallen in last 6 months?  Yes, See PT evaluation for details  LIVING ENVIRONMENT: Lives with: lives alone Lives in: House/apartment  PLOF:  Level of assistance: Needed assistance with ADLs, Needed assistance with IADLS Employment: On disability  PATIENT GOALS: get better   OBJECTIVE:   COGNITION: Overall cognitive status: Impaired Areas of impairment:  Attention: Impaired: Focused, Sustained, Alternating, Divided Memory: Impaired: Working, Short term, Prospective Awareness: Impaired: Intellectual, Emergent, and Anticipatory Executive function: Impaired: Problem solving and Error  awareness Functional deficits: Pt denied cognitive deficits despite SLP detailing memory concerns reported during OT evaluation. Reduced intellectual awareness of cognitive deficits exhibited despite SLP prompting, cues, and education. Overt impairments in sustained and focused attention exhibited with usual inappropriate deviations noted during evaluation (ex: commenting on things outside window, SLP rings, cups in room).   COGNITIVE COMMUNICATION: Following directions: Follows multi-step commands inconsistently  Auditory  comprehension: WFL Functional communication: Impaired: reduced intelligibility  (no longer wearing bottom dentures which impacts speech intelligibility)  ORAL MOTOR EXAMINATION: Overall status: WFL  STANDARDIZED ASSESSMENTS: SLUMS: 22 /30  TODAY'S TREATMENT:                                                                                                                                         12/27/22: eval only  PATIENT EDUCATION: Education details: POC, attention strategies   Person educated: Patient Education method: Explanation, Demonstration, and Handouts Education comprehension: verbalized understanding and returned demonstration   GOALS: Goals reviewed with patient? Yes  SHORT TERM GOALS: Target date: 01/24/2023  Pt will use memory aids/compensations to ensure accurate medication administration for 2/2 opportunities given occasional min A  Baseline: missed meds/errors  Goal status: INITIAL  2.  Pt will demonstrate appropriate attention to task with 2 or less deviations over 2 sessions  Baseline: overt impaired attention Goal status: INITIAL  3.  Pt will carryover of dysarthria compensations to be 100% intelligible in short structured conversation given rare min A Baseline: mildly reduced conversational speech intelligibility  Goal status: INITIAL   LONG TERM GOALS: Target date: 02/21/2023  Pt will accurately manage medications with no missed doses or errors reported >1 week  Baseline: missed meds/errors  Goal status: INITIAL  2.  Pt will ID safety concerns and verbalize appropriate solution for 2/2 opportunities given occasional mod A  Baseline: overt impaired awareness Goal status: INITIAL  3.  Pt will carryover of dysarthria compensations to be 100% intelligible in extended unstructured conversation given rare min A Baseline: mildly reduced conversational speech intelligibility  Goal status: INITIAL  ASSESSMENT:  CLINICAL IMPRESSION: Patient is a 57 y.o. F who  was seen today for cognitive changes s/p stroke in December 2023. PMHx includes CVA, thyroid cancer, seizures, stroke, OT R shoulder, prior strokes, HLD, tobacco abuse, cocaine abuse, ETOH abuse, and HTN. Pt presented with limited sustained and focused attention (usual deviations during evaluation), impaired intellectual awareness of cognitive deficits, and reduced recall. Pt reported difficulty with pronunciation since initial stroke in 2017. Pt no longer wears bottom denture d/t ill fit, which may be contributing factor. Pt noted with one initial sound repetition and speech errors x2 this date. Given cognitive communication changes secondary to multiple strokes, pt would benefit from skilled ST intervention to optimize speech clarity and cognitive functioning to optimize pt safety and QOL.   OBJECTIVE IMPAIRMENTS: include attention, memory, awareness, executive functioning, and dysarthria. These impairments are limiting  patient from managing medications, managing appointments, and household responsibilities. Factors affecting potential to achieve goals and functional outcome are ability to learn/carryover information and previous level of function.. Patient will benefit from skilled SLP services to address above impairments and improve overall function.  REHAB POTENTIAL: Fair - awareness/insight into deficits   PLAN:  SLP FREQUENCY: 1x/week  SLP DURATION: 8 weeks  PLANNED INTERVENTIONS: Environmental controls, Cueing hierachy, Cognitive reorganization, Internal/external aids, Functional tasks, SLP instruction and feedback, Compensatory strategies, and Patient/family education  Check all possible CPT codes: 92507 - SLP treatment, [phone] - Cognitive training (First 15 min), and 97130 - Cognitive training (each additional 15 min)    Check all conditions that are expected to impact treatment: Cognitive impairment and Social determinants of health   If treatment provided at initial evaluation, no  treatment charged due to lack of authorization.     Marzetta Board, [place] 12/27/2022, 3:12 PM

## 2022-12-27 NOTE — Patient Instructions (Signed)

## 2023-01-02 ENCOUNTER — Ambulatory Visit: Payer: Medicaid Other | Admitting: Physical Therapy

## 2023-01-02 ENCOUNTER — Ambulatory Visit: Payer: Medicaid Other | Admitting: Occupational Therapy

## 2023-01-02 NOTE — Therapy (Deleted)
OUTPATIENT OCCUPATIONAL THERAPY NEURO TREATMENT  Patient Name: Theresa Watkins MRN: [id number] DOB:07-02-1966, 57 y.o., female Today's Date: 01/02/2023  PCP: Ladell Pier, MD     REFERRING PROVIDER:  Little Ishikawa, MD  END OF SESSION:    Past Medical History:  Diagnosis Date   Abnormal Pap smear    Bronchitis    Fibroid    Headache(784.0)    Ovarian cyst    Plantar fasciitis    Seizures ([place])    Stroke [facility])    Thyroid cancer ([place]) [address]   Trichomonas    Urinary tract infection    Past Surgical History:  Procedure Laterality Date   ABDOMINAL HYSTERECTOMY     CESAREAN SECTION     goiter     removed   laparoscopic  1988   removal  of ectopic preg, ruptured tube   THERAPEUTIC ABORTION     THYROIDECTOMY  march 2011   cancer   Patient Active Problem List   Diagnosis Date Noted   Prediabetes 12/12/2022   Acute CVA (cerebrovascular accident) ([place]) 11/10/2022   Acute ischemic stroke ([place]) 11/09/2022   Chondromalacia patellae, 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   Unilateral primary osteoarthritis, left hip 09/25/2021   Left foot pain 08/26/2019   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   Tobacco dependence 04/09/2019   Vitamin D deficiency 02/18/2018   Toe deformity, acquired, right 02/18/2018   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   Hyperlipemia 03/21/2017   Headache 03/21/2017   Tooth pain    CVA (cerebral infarction) 12/27/2015   HLD (hyperlipidemia)    Hemiparesis affecting dominant side as late effect of stroke ([facility])    Tobacco abuse    Cocaine use disorder ([place])    ETOH abuse    Postoperative hypothyroidism 12/24/2015   Drug abuse, cocaine type ([place]) 05/09/2014   Hx of thyroid cancer 01/25/2014   Essential hypertension 06/03/2012    ONSET DATE: 11/14/2022   REFERRING DIAG: I63.9 (ICD-10-CM) - Cerebrovascular accident (CVA), unspecified mechanism ([place]) I63.9 (ICD-10-CM) - Acute CVA (cerebrovascular accident)  ([place])  THERAPY DIAG:  No diagnosis found.  Rationale for Evaluation and Treatment: Rehabilitation  SUBJECTIVE:   SUBJECTIVE STATEMENT: Pt reports pain in L upper arm and shoulder which improves after taking an Aspirin. She has experienced this pain since her stroke, which has also worsened due to her numerous falls. She reports increased difficulty with her memory since her stroke. Sometimes she is unsure if she takes her morning medications, so she takes her pills again at night.   Pt accompanied by: self  PERTINENT HISTORY:  Presented on 12/22 with dizziness, left sided weakness/numbness and diplopia + cocaine. 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 PMH includes CVA (2017), HTN, thyroid cancer, cocaine use, seizures, UTI, former EtOH abuse.   PRECAUTIONS: Fall  WEIGHT BEARING RESTRICTIONS: No  PAIN:  Are you having pain? Yes: NPRS scale: 7/10 Pain location: generalized Pain description: everywhere Aggravating factors: moving Relieving factors: rest  FALLS: Has patient fallen in last 6 months? Yes. Number of falls at least 11  LIVING ENVIRONMENT: Lives with: lives alone; mom and son comes over to help her with things  Lives in: House/apartment Stairs: Yes: External: 1 steps; none Has following equipment at home: Single point cane, Walker - 2 wheeled, and shower chair Pt uses Depends so she doesn't have to go to the bathroom as much.   PLOF: Independent; could drive but didn't own a car  PATIENT GOALS: Return to  PLOF  OBJECTIVE:   HAND DOMINANCE: Right  ADLs: Overall ADLs: mod I reported though safety concerns given presentation in clinic Equipment: Shower seat with back  IADLs: Shopping: at prior level Light housekeeping: mod A Meal Prep:not at prior level but reports ability to cook light stovetop meals Community mobility: dependent Medication management: independent (though is not taking correctly given pt report) Financial management: reports  independence Handwriting: 25% legible  MOBILITY STATUS: Needs Assist: min A; defers use of AD/does not own a cane  ACTIVITY TOLERANCE: Activity tolerance: fair given pain and balance  FUNCTIONAL OUTCOME MEASURES: FOTO: 55 (moderate severity)    UPPER EXTREMITY ROM:    BUE WFL though pt reports increase in L shoulder pain  UPPER EXTREMITY MMT:     BUE BFL: grossly 3+/5 throughout  HAND FUNCTION: Grip strength: Right: 32.8 lbs; Left: 21.8 lbs  COORDINATION: 9 Hole Peg test: Right: 36 sec; Left: 39 sec  SENSATION: Denies numbness or tingling  EDEMA: none  MUSCLE TONE: WFL  COGNITION: Overall cognitive status: Impaired - memory, problem solving, processing, attention deficits noted throughout session  VISION: Subjective report: vision has been better since stroke; occasional double vision Baseline vision: Wears glasses for reading only and OTC readers Visual history:  n/a  VISION ASSESSMENT: WFL Will continue to assess as needed  PERCEPTION: WFL  PRAXIS: WFL  OBSERVATIONS: Pt had LOB ambulating from table to lobby without use of AD. Pt's shoe laces were untied and required assistance from therapist for safety. Pt had glasses donned and appears well-kept.    TODAY'S TREATMENT:                                                                                                                              N/a  PATIENT EDUCATION: Education details: OT POC; Role of OT and ST; use of heat and stretches for L shoulder pain Person educated: Patient Education method: Explanation Education comprehension: verbalized understanding and needs further education  HOME EXERCISE PROGRAM: N/a   GOALS:  SHORT TERM GOALS: Target date: 01/15/2023   Patient will demonstrate BUE HEP with 25% verbal cues or less for proper execution.  Baseline: not yet initiated Goal status: INITIAL  2.  Patient will complete nine-hole peg with use of each hand in 32 seconds or  less.  Baseline: Right: 36 sec; Left: 39 sec Goal status: INITIAL  LONG TERM GOALS: Target date: 02/14/2023    Patient will complete updated BUE HEP with 25% verbal cues or less for proper execution.  Baseline: not yet initiated Goal status: INITIAL  2.  Pt will demonstrate ability to setup pill box with 0 errors.   Baseline: Pt reports she is not taking medications correctly. Goal status: INITIAL  3.  Patient will demonstrate at least 30 lbs L grip strength as needed to open jars and other containers.  Baseline: 21.8 lbs L grip strength Goal status: INITIAL  4.  Pt will improve FOTO score  to mild severity or better.   Baseline: moderate severity Goal status: INITIAL   ASSESSMENT:  CLINICAL IMPRESSION: Patient is a 57 y.o. female who was seen today for occupational therapy evaluation for CVA. Hx includes thyroid cancer, seizures, stroke, OT R shoulder, prior strokes, HLD, tobacco abuse, cocaine abuse, ETOH abuse, and HTN. Patient currently presents below baseline level of functioning demonstrating functional deficits and impairments as noted below. Pt would benefit from skilled OT services in the outpatient setting to work on impairments as noted below to help pt return to PLOF as able.    PERFORMANCE DEFICITS: in functional skills including ADLs, IADLs, coordination, strength, pain, Fine motor control, mobility, balance, endurance, decreased knowledge of precautions, and UE functional use, cognitive skills including attention, memory, problem solving, and safety awareness.  IMPAIRMENTS: are limiting patient from ADLs, IADLs, and leisure.   CO-MORBIDITIES: may have co-morbidities  that affects occupational performance. Patient will benefit from skilled OT to address above impairments and improve overall function.  REHAB POTENTIAL: Fair given insight to safety   PLAN:  OT FREQUENCY: 1x/week  OT DURATION: 8 weeks  PLANNED INTERVENTIONS: self care/ADL training,  therapeutic exercise, therapeutic activity, neuromuscular re-education, manual therapy, passive range of motion, balance training, functional mobility training, splinting, electrical stimulation, ultrasound, paraffin, fluidotherapy, moist heat, contrast bath, patient/family education, cognitive remediation/compensation, visual/perceptual remediation/compensation, DME and/or AE instructions, and Re-evaluation  RECOMMENDED OTHER SERVICES: ST for assessment of cognition - referral requested through [facility]  CONSULTED AND AGREED WITH PLAN OF CARE: Patient  PLAN FOR NEXT SESSION: [place] HEP with primary focus on LUE; ADLs/home safety  Check all possible CPT codes: [phone] - OT Re-evaluation, 97110- Therapeutic Exercise, [phone]- Neuro Re-education, 97140 - Manual Therapy, 97530 - Therapeutic Activities, [id number] - Self Care, and [phone] - Ultrasound    Check all conditions that are expected to impact treatment: Cognitive impairment, Neurological condition, and Social determinants of health   If treatment provided at initial evaluation, no treatment charged due to lack of authorization.        Dennis Bast, OT 01/02/2023, 1:34 PM

## 2023-01-09 ENCOUNTER — Encounter: Payer: Self-pay | Admitting: Occupational Therapy

## 2023-01-09 ENCOUNTER — Ambulatory Visit: Payer: Medicaid Other | Admitting: Occupational Therapy

## 2023-01-09 ENCOUNTER — Ambulatory Visit: Payer: Medicaid Other | Admitting: Speech Pathology

## 2023-01-09 DIAGNOSIS — Z9181 History of falling: Secondary | ICD-10-CM | POA: Diagnosis not present

## 2023-01-09 DIAGNOSIS — I69319 Unspecified symptoms and signs involving cognitive functions following cerebral infarction: Secondary | ICD-10-CM | POA: Diagnosis not present

## 2023-01-09 DIAGNOSIS — R41841 Cognitive communication deficit: Secondary | ICD-10-CM

## 2023-01-09 DIAGNOSIS — M6281 Muscle weakness (generalized): Secondary | ICD-10-CM | POA: Diagnosis not present

## 2023-01-09 DIAGNOSIS — R471 Dysarthria and anarthria: Secondary | ICD-10-CM | POA: Diagnosis not present

## 2023-01-09 DIAGNOSIS — R278 Other lack of coordination: Secondary | ICD-10-CM | POA: Diagnosis not present

## 2023-01-09 DIAGNOSIS — R41840 Attention and concentration deficit: Secondary | ICD-10-CM

## 2023-01-09 NOTE — Therapy (Signed)
[facility] [address] [place], [place], [address] Phone: [phone]   Fax:  [phone]  Patient Details  Name: Theresa Watkins MRN: [id number] Date of Birth: 1966-07-28 Referring Provider:  Ladell Pier, MD  Encounter Date: 01/09/2023  Pt arrived to [facility] treatment room from OT. Tells SLP that today is not a good day to do ST due to extreme fatigue and high level of pain. Denies headache, slurred speech. Not demonstrating facial droop. SLP questions if pt desires to leave and she endorses. Will continue per POC and next scheduled visit.   Leroy Libman, [place] 01/09/2023, 3:35 PM  [facility] [address] [place], [place], [address] Phone: [phone]   Fax:  [phone]

## 2023-01-09 NOTE — Patient Instructions (Addendum)
   Heat Home Program  Heat is used as part of your therapy for several reasons.  Heat increases blood flow, which promotes healing. Heat also relaxes muscles and joints which makes exercising easier.  It can be applied for __10-15_ minutes,  _up to 3_ sessions per day  Use one of the following methods that is most convenient for you  Heating pad: Follow instructions given by manufacturer.  Use on low-medium setting.    Moist heated towel: Soak towel in hot water or place damp towel in microwave and heat for 30 sec. Check temperature to determine if further time needed.  Wring out towel and wrap around affected area, cover with a plastic bag.  Can also wrap a dry towel around the plastic to help retain the heat.    Microwave gel pack: Follow instructions given by manufacturer.  Check temperature before application to ensure not hot enough to cause a burn    Rice sock: This can be made using a sock with no synthetic material and 1.5 cups of rice.  Pour the rice into the sock, tie a knot at the top.  Place in microwave for 1 minute, remove to check temperature to determine if further heating time is required prior to application  Be sure to check the skin periodically to ensure no excessive redness or blisters.  Use with caution in areas with decreased sensation   Table top push ups: lean into your left arm   Left lateral leans: while sitting at edge of bed or in chair, lean into left arm

## 2023-01-09 NOTE — Therapy (Deleted)
OUTPATIENT SPEECH LANGUAGE PATHOLOGY EVALUATION   Patient Name: Theresa Watkins MRN: [id number] DOB:1966/11/06, 57 y.o., female Today's Date: 12/27/2022  PCP: Ladell Pier, MD  REFERRING PROVIDER: Frann Rider, NP   END OF SESSION:  End of Session - 12/27/22 1403     Visit Number 1    Number of Visits 9    Date for SLP Re-Evaluation 02/21/23    Authorization Type [facility] Medicaid    SLP Start Time [id number]    SLP Stop Time  [id number]    SLP Time Calculation (min) 42 min    Activity Tolerance Patient tolerated treatment well             Past Medical History:  Diagnosis Date   Abnormal Pap smear    Bronchitis    Fibroid    Headache(784.0)    Ovarian cyst    Plantar fasciitis    Seizures ([place])    Stroke ([place])    Thyroid cancer ([place]) [address]11   Trichomonas    Urinary tract infection    Past Surgical History:  Procedure Laterality Date   ABDOMINAL HYSTERECTOMY     CESAREAN SECTION     goiter     removed   laparoscopic  1988   removal  of ectopic preg, ruptured tube   THERAPEUTIC ABORTION     THYROIDECTOMY  march 2011   cancer   Patient Active Problem List   Diagnosis Date Noted   Prediabetes 12/12/2022   Acute CVA (cerebrovascular accident) ([place]) 11/10/2022   Acute ischemic stroke ([place]) 11/09/2022   Chondromalacia patellae, 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   Unilateral primary osteoarthritis, left hip 09/25/2021   Left foot pain 08/26/2019   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   Tobacco dependence 04/09/2019   Vitamin D deficiency 02/18/2018   Toe deformity, acquired, right 02/18/2018   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   Hyperlipemia 03/21/2017   Headache 03/21/2017   Tooth pain    CVA (cerebral infarction) 12/27/2015   HLD (hyperlipidemia)    Hemiparesis affecting dominant side as late effect of stroke ([place])    Tobacco abuse    Cocaine use disorder ([place])    ETOH abuse    Postoperative hypothyroidism 12/24/2015   Drug  abuse, cocaine type ([place]) 05/09/2014   Hx of thyroid cancer 01/25/2014   Essential hypertension 06/03/2012    ONSET DATE: 11/14/2022     REFERRING DIAG: [id number] (ICD-10-CM) - Cognitive deficit, post-stroke   THERAPY DIAG: Cognitive communication deficit  Dysarthria and anarthria  Rationale for Evaluation and Treatment: Rehabilitation  SUBJECTIVE:   SUBJECTIVE STATEMENT: *** Pt accompanied by: self  PERTINENT HISTORY: Presented on 12/22 with dizziness, left sided weakness/numbness and diplopia + cocaine. 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 PMH includes CVA (2017), HTN, thyroid cancer, cocaine use, seizures, UTI, former EtOH abuse.    PAIN: Are you having pain? Yes: NPRS scale: 7/10 Pain location: left foot  FALLS: Has patient fallen in last 6 months?  Yes, See PT evaluation for details  PATIENT GOALS: get better   OBJECTIVE:   TODAY'S TREATMENT:  01/09/23:  12/27/22: eval only  PATIENT EDUCATION: Education details: POC, attention strategies   Person educated: Patient Education method: Explanation, Demonstration, and Handouts Education comprehension: verbalized understanding and returned demonstration   GOALS: Goals reviewed with patient? Yes  SHORT TERM GOALS: Target date: 01/24/2023  Pt will use memory aids/compensations to ensure accurate medication administration for 2/2 opportunities given occasional min A  Baseline: missed meds/errors  Goal status: IN PROGRESS  2.  Pt will demonstrate appropriate attention to task with 2 or less deviations over 2 sessions  Baseline: overt impaired attention Goal status: IN PROGRESS  3.  Pt will carryover of dysarthria compensations to be 100% intelligible in short structured conversation given rare min A Baseline: mildly reduced conversational speech intelligibility  Goal status:  IN PROGRESS   LONG TERM GOALS: Target date: 02/21/2023  Pt will accurately manage medications with no missed doses or errors reported >1 week  Baseline: missed meds/errors  Goal status: IN PROGRESS  2.  Pt will ID safety concerns and verbalize appropriate solution for 2/2 opportunities given occasional mod A  Baseline: overt impaired awareness Goal status: IN PROGRESS  3.  Pt will carryover of dysarthria compensations to be 100% intelligible in extended unstructured conversation given rare min A Baseline: mildly reduced conversational speech intelligibility  Goal status: IN PROGRESS  ASSESSMENT:  CLINICAL IMPRESSION: Patient is a 57 y.o. F who was seen today for cognitive changes s/p stroke in December 2023. PMHx includes CVA, thyroid cancer, seizures, stroke, OT R shoulder, prior strokes, HLD, tobacco abuse, cocaine abuse, ETOH abuse, and HTN. Pt presented with limited sustained and focused attention (usual deviations during evaluation), impaired intellectual awareness of cognitive deficits, and reduced recall. Pt reported difficulty with pronunciation since initial stroke in 2017. Pt no longer wears bottom denture d/t ill fit, which may be contributing factor. Pt noted with one initial sound repetition and speech errors x2 this date. Given cognitive communication changes secondary to multiple strokes, pt would benefit from skilled ST intervention to optimize speech clarity and cognitive functioning to optimize pt safety and QOL.   OBJECTIVE IMPAIRMENTS: include attention, memory, awareness, executive functioning, and dysarthria. These impairments are limiting patient from managing medications, managing appointments, and household responsibilities. Factors affecting potential to achieve goals and functional outcome are ability to learn/carryover information and previous level of function.. Patient will benefit from skilled SLP services to address above impairments and improve overall  function.  REHAB POTENTIAL: Fair - awareness/insight into deficits   PLAN:  SLP FREQUENCY: 1x/week  SLP DURATION: 8 weeks  PLANNED INTERVENTIONS: Environmental controls, Cueing hierachy, Cognitive reorganization, Internal/external aids, Functional tasks, SLP instruction and feedback, Compensatory strategies, and Patient/family education  Check all possible CPT codes: 92507 - SLP treatment, [phone] - Cognitive training (First 15 min), and 97130 - Cognitive training (each additional 15 min)    Check all conditions that are expected to impact treatment: Cognitive impairment and Social determinants of health   If treatment provided at initial evaluation, no treatment charged due to lack of authorization.     Marzetta Board, [facility] 12/27/2022, 3:12 PM

## 2023-01-09 NOTE — Therapy (Signed)
OUTPATIENT OCCUPATIONAL THERAPY NEURO TREATMENT  Patient Name: Theresa Watkins MRN: [id number] DOB:Mar 30, 1966, 57 y.o., female Today's Date: 01/09/2023  PCP: Ladell Pier, MD     REFERRING PROVIDER:  Little Ishikawa, MD  END OF SESSION:   OT End of Session - 01/09/23 1450      Visit Number 2     Number of Visits 9     Date for OT Re-Evaluation 02/14/23     Authorization Type [facility] Medicaid - requires auth     OT Start Time 1450    OT Stop Time [id number]    OT Time Calculation (min) 40 min     Activity Tolerance Patient tolerated treatment well     Behavior During Therapy [facility] for tasks assessed/performed       Past Medical History:  Diagnosis Date   Abnormal Pap smear    Bronchitis    Fibroid    Headache(784.0)    Ovarian cyst    Plantar fasciitis    Seizures ([place])    Stroke ([place])    Thyroid cancer ([place]) [address]11   Trichomonas    Urinary tract infection    Past Surgical History:  Procedure Laterality Date   ABDOMINAL HYSTERECTOMY     CESAREAN SECTION     goiter     removed   laparoscopic  1988   removal  of ectopic preg, ruptured tube   THERAPEUTIC ABORTION     THYROIDECTOMY  march 2011   cancer   Patient Active Problem List   Diagnosis Date Noted   Prediabetes 12/12/2022   Acute CVA (cerebrovascular accident) ([place]) 11/10/2022   Acute ischemic stroke ([place]) 11/09/2022   Chondromalacia patellae, 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   Unilateral primary osteoarthritis, left hip 09/25/2021   Left foot pain 08/26/2019   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   Tobacco dependence 04/09/2019   Vitamin D deficiency 02/18/2018   Toe deformity, acquired, right 02/18/2018   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   Hyperlipemia 03/21/2017   Headache 03/21/2017   Tooth pain    CVA (cerebral infarction) 12/27/2015   HLD (hyperlipidemia)    Hemiparesis affecting dominant side as late effect of stroke ([place])    Tobacco abuse    Cocaine use disorder  ([place])    ETOH abuse    Postoperative hypothyroidism 12/24/2015   Drug abuse, cocaine type ([place]) 05/09/2014   Hx of thyroid cancer 01/25/2014   Essential hypertension 06/03/2012    ONSET DATE: 11/14/2022   REFERRING DIAG: I63.9 (ICD-10-CM) - Cerebrovascular accident (CVA), unspecified mechanism ([place]) I63.9 (ICD-10-CM) - Acute CVA (cerebrovascular accident) ([place])  THERAPY DIAG:  THERAPY DIAG:  Muscle weakness (generalized)  Other lack of coordination  Attention and concentration deficit  History of falling  Cognitive communication deficit   Rationale for Evaluation and Treatment: Rehabilitation  SUBJECTIVE:   SUBJECTIVE STATEMENT: She has not been able to use a heating pad.  Pt accompanied by: self  PERTINENT HISTORY:  Presented on 12/22 with dizziness, left sided weakness/numbness and diplopia + cocaine. 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 PMH includes CVA (2017), HTN, thyroid cancer, cocaine use, seizures, UTI, former EtOH abuse.   PRECAUTIONS: Fall  WEIGHT BEARING RESTRICTIONS: No  PAIN:  Are you having pain? Yes: NPRS scale: 8/10 Pain location: generalized Pain description: everywhere Aggravating factors: moving Relieving factors: rest  FALLS: Has patient fallen in last 6 months? Yes. Number of falls at least 12; last one about 4 days ago while walking  in living room.  LIVING ENVIRONMENT: Lives with: lives alone; mom and son comes over to help her with things  Lives in: House/apartment Stairs: Yes: External: 1 steps; none Has following equipment at home: Single point cane, [place] - 2 wheeled, and shower chair Pt uses Depends so she doesn't have to go to the bathroom as much.   PLOF: Independent; could drive but didn't own a car  PATIENT GOALS: Return to PLOF  OBJECTIVE:   HAND DOMINANCE: Right  ADLs: Overall ADLs: mod I reported though safety concerns given presentation in clinic Equipment: Shower seat with back  IADLs: Shopping:  at prior level Light housekeeping: mod A Meal Prep:not at prior level but reports ability to cook light stovetop meals Community mobility: dependent Medication management: independent (though is not taking correctly given pt report) Financial management: reports independence Handwriting: 25% legible  MOBILITY STATUS: Needs Assist: min A; defers use of AD/does not own a cane  ACTIVITY TOLERANCE: Activity tolerance: fair given pain and balance  FUNCTIONAL OUTCOME MEASURES: FOTO: 55 (moderate severity)    UPPER EXTREMITY ROM:    BUE WFL though pt reports increase in L shoulder pain  UPPER EXTREMITY MMT:     BUE BFL: grossly 3+/5 throughout  HAND FUNCTION: Grip strength: Right: 32.8 lbs; Left: 21.8 lbs  COORDINATION: 9 Hole Peg test: Right: 36 sec; Left: 39 sec  SENSATION: Denies numbness or tingling  EDEMA: none  MUSCLE TONE: WFL  COGNITION: Overall cognitive status: Impaired - memory, problem solving, processing, attention deficits noted throughout session  VISION: Subjective report: vision has been better since stroke; occasional double vision Baseline vision: Wears glasses for reading only and OTC readers Visual history:  n/a  VISION ASSESSMENT: WFL Will continue to assess as needed  PERCEPTION: WFL  PRAXIS: WFL  OBSERVATIONS: Pt had LOB ambulating from table to lobby without use of AD. Pt's shoe laces were untied and required assistance from therapist for safety. Pt had glasses donned and appears well-kept.    TODAY'S TREATMENT:                                                                                                                              - Neuro re-education completed for duration as noted below including: OT applied heating pad over L shoulder for 8 minutes while educating pt on use of heat and shoulder dowel HEP as noted in pt instructions. Pt unable to complete in sitting due to pain. Able to complete in supine at mat table.  OT  initiated L table top scrubs, push-ups, and EOB lateral leans as noted in patient instructions for improved LUE ROM and pain following CVA.  PATIENT EDUCATION: Education details: LUE HEP/pain reduction Person educated: Patient Education method: Explanation, Demonstration, and Handouts Education comprehension: verbalized understanding, returned demonstration, and needs further education  HOME EXERCISE PROGRAM: 2/22: L dowel HEP (supine)   GOALS:  SHORT TERM GOALS: Target date: 01/15/2023   Patient will demonstrate  BUE HEP with 25% verbal cues or less for proper execution.  Baseline: not yet initiated Goal status: INITIAL  2.  Patient will complete nine-hole peg with use of each hand in 32 seconds or less.  Baseline: Right: 36 sec; Left: 39 sec Goal status: INITIAL  LONG TERM GOALS: Target date: 02/14/2023    Patient will complete updated BUE HEP with 25% verbal cues or less for proper execution.  Baseline: not yet initiated Goal status: INITIAL  2.  Pt will demonstrate ability to setup pill box with 0 errors.   Baseline: Pt reports she is not taking medications correctly. Goal status: INITIAL  3.  Patient will demonstrate at least 30 lbs L grip strength as needed to open jars and other containers.  Baseline: 21.8 lbs L grip strength Goal status: INITIAL  4.  Pt will improve FOTO score to mild severity or better.   Baseline: moderate severity Goal status: INITIAL   ASSESSMENT:  CLINICAL IMPRESSION: Pt would benefit from skilled OT services in the outpatient setting to work on impairments as noted below to help pt return to PLOF as able.    PERFORMANCE DEFICITS: in functional skills including ADLs, IADLs, coordination, strength, pain, Fine motor control, mobility, balance, endurance, decreased knowledge of precautions, and UE functional use, cognitive skills including attention, memory, problem solving, and safety awareness.  IMPAIRMENTS: are limiting patient from  ADLs, IADLs, and leisure.   CO-MORBIDITIES: may have co-morbidities  that affects occupational performance. Patient will benefit from skilled OT to address above impairments and improve overall function.  REHAB POTENTIAL: Fair given insight to safety   PLAN:  OT FREQUENCY: 1x/week  OT DURATION: 8 weeks  PLANNED INTERVENTIONS: self care/ADL training, therapeutic exercise, therapeutic activity, neuromuscular re-education, manual therapy, passive range of motion, balance training, functional mobility training, splinting, electrical stimulation, ultrasound, paraffin, fluidotherapy, moist heat, contrast bath, patient/family education, cognitive remediation/compensation, visual/perceptual remediation/compensation, DME and/or AE instructions, and Re-evaluation  RECOMMENDED OTHER SERVICES: ST for assessment of cognition - referral requested through [facility]  CONSULTED AND AGREED WITH PLAN OF CARE: Patient  PLAN FOR NEXT SESSION: Review dowel HEP in supine with primary focus on LUE; ADLs/home safety  Check all possible CPT codes: 97168 - OT Re-evaluation, 97110- Therapeutic Exercise, [phone]- Neuro Re-education, 97140 - Manual Therapy, 97530 - Therapeutic Activities, [id number] - Self Care, and [phone] - Ultrasound    Check all conditions that are expected to impact treatment: Cognitive impairment, Neurological condition, and Social determinants of health   If treatment provided at initial evaluation, no treatment charged due to lack of authorization.       Dennis Bast, OT 01/09/2023, 2:48 PM

## 2023-01-16 ENCOUNTER — Ambulatory Visit: Payer: Medicaid Other | Admitting: Speech Pathology

## 2023-01-16 ENCOUNTER — Ambulatory Visit: Payer: Medicaid Other | Admitting: Occupational Therapy

## 2023-01-23 ENCOUNTER — Ambulatory Visit: Payer: Medicaid Other | Attending: Adult Health | Admitting: Speech Pathology

## 2023-01-28 DIAGNOSIS — N3946 Mixed incontinence: Secondary | ICD-10-CM | POA: Diagnosis not present

## 2023-01-28 DIAGNOSIS — R32 Unspecified urinary incontinence: Secondary | ICD-10-CM | POA: Diagnosis not present

## 2023-01-30 ENCOUNTER — Ambulatory Visit: Payer: Medicaid Other | Admitting: Speech Pathology

## 2023-02-28 DIAGNOSIS — N3946 Mixed incontinence: Secondary | ICD-10-CM | POA: Diagnosis not present

## 2023-02-28 DIAGNOSIS — R32 Unspecified urinary incontinence: Secondary | ICD-10-CM | POA: Diagnosis not present

## 2023-03-17 ENCOUNTER — Other Ambulatory Visit (HOSPITAL_COMMUNITY): Payer: Self-pay

## 2023-03-17 ENCOUNTER — Other Ambulatory Visit: Payer: Self-pay

## 2023-03-18 ENCOUNTER — Other Ambulatory Visit (HOSPITAL_COMMUNITY): Payer: Self-pay

## 2023-03-18 ENCOUNTER — Other Ambulatory Visit: Payer: Self-pay

## 2023-04-17 ENCOUNTER — Other Ambulatory Visit: Payer: Self-pay

## 2023-04-17 ENCOUNTER — Other Ambulatory Visit (HOSPITAL_COMMUNITY): Payer: Self-pay

## 2023-04-17 ENCOUNTER — Encounter: Payer: Self-pay | Admitting: Internal Medicine

## 2023-04-17 ENCOUNTER — Ambulatory Visit: Payer: Medicaid Other | Attending: Internal Medicine | Admitting: Internal Medicine

## 2023-04-17 VITALS — BP 170/98 | HR 84 | Temp 98.6°F | Ht 65.0 in | Wt 150.0 lb

## 2023-04-17 DIAGNOSIS — N3941 Urge incontinence: Secondary | ICD-10-CM

## 2023-04-17 DIAGNOSIS — M79652 Pain in left thigh: Secondary | ICD-10-CM

## 2023-04-17 DIAGNOSIS — F1410 Cocaine abuse, uncomplicated: Secondary | ICD-10-CM

## 2023-04-17 DIAGNOSIS — Z8673 Personal history of transient ischemic attack (TIA), and cerebral infarction without residual deficits: Secondary | ICD-10-CM

## 2023-04-17 DIAGNOSIS — L84 Corns and callosities: Secondary | ICD-10-CM | POA: Diagnosis not present

## 2023-04-17 DIAGNOSIS — M67472 Ganglion, left ankle and foot: Secondary | ICD-10-CM | POA: Diagnosis not present

## 2023-04-17 DIAGNOSIS — E890 Postprocedural hypothyroidism: Secondary | ICD-10-CM | POA: Diagnosis not present

## 2023-04-17 DIAGNOSIS — I10 Essential (primary) hypertension: Secondary | ICD-10-CM

## 2023-04-17 DIAGNOSIS — Z1231 Encounter for screening mammogram for malignant neoplasm of breast: Secondary | ICD-10-CM | POA: Diagnosis not present

## 2023-04-17 MED ORDER — VALSARTAN 40 MG PO TABS
40.0000 mg | ORAL_TABLET | Freq: Every day | ORAL | 3 refills | Status: DC
Start: 2023-04-17 — End: 2024-02-03
  Filled 2023-04-17 (×3): qty 30, 30d supply, fill #0
  Filled 2023-05-26 – 2023-06-09 (×2): qty 30, 30d supply, fill #1

## 2023-04-17 MED ORDER — LEVOTHYROXINE SODIUM 125 MCG PO TABS
125.0000 ug | ORAL_TABLET | Freq: Every day | ORAL | 1 refills | Status: DC
  Filled 2023-04-17 (×2): qty 90, 90d supply, fill #0

## 2023-04-17 MED ORDER — ROSUVASTATIN CALCIUM 40 MG PO TABS
40.0000 mg | ORAL_TABLET | Freq: Every day | ORAL | 1 refills | Status: DC
  Filled 2023-04-17 – 2023-07-12 (×4): qty 90, 90d supply, fill #0
  Filled 2024-01-05: qty 90, 90d supply, fill #1

## 2023-04-17 NOTE — Patient Instructions (Signed)
Your blood pressure is elevated.  Start Diovan 40 mg daily.  Follow up in 2 weeks for BP recheck.

## 2023-04-17 NOTE — Progress Notes (Signed)
Patient ID: Theresa Watkins, female    DOB: 02-17-1966  MRN: [id number]  CC: Hypertension (HTN f/u. Med refill - calcium/Amlodipine causing dizziness. /Requesting wipes for incontinence supplies)   Subjective: Theresa Watkins is a 57 y.o. female who presents for chronic ds management Her concerns today include:  Patient with history of HTN, HL, CVA (LT basal ganglia hemorrhagic infarct 12/2015 and 10/2022), preDM, hypothyroidism (hx of thyroid CA 2011), GERD, anxious mood, tob dep, episodic cocaine    HTN:  BP elev today.  Reports she used cocaine this a.m.  Trying to give it up; not interested in a treatment program She stopped Norvasc because it makes her dizziness. Checks BP once a wk.  Reports readings have been 120-140/50-60. Reports compliance with Crestor and Plavix.  Neurologist told her to stop ASA Reports compliance with Levothyroxine  Complains of having painful callus on the left foot along with another growth.  She would like me to have a look at it.  Some soreness in the left upper inner thigh x 1 month.  No initiating factor that she recalls.  Discomfort with standing.  Patient has the urge incontinence.  She wears depends undergarments.  She is requesting a prescription for some sanitary wipes.  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   Unilateral primary osteoarthritis, left hip 09/25/2021   Left foot pain 08/26/2019   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   Tobacco dependence 04/09/2019   Vitamin D deficiency 02/18/2018   Toe deformity, acquired, right 02/18/2018   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   Hyperlipemia 03/21/2017   Headache 03/21/2017   Tooth pain    CVA (cerebral infarction) 12/27/2015   HLD (hyperlipidemia)    Hemiparesis affecting  dominant side as late effect of stroke (HCC)    Tobacco abuse    Cocaine use disorder (HCC)    ETOH abuse    Postoperative hypothyroidism 12/24/2015   Drug abuse, cocaine type (HCC) 05/09/2014   Hx of thyroid cancer 01/25/2014   Essential hypertension 06/03/2012     Current Outpatient Medications on File Prior to Visit  Medication Sig Dispense Refill   clopidogrel (PLAVIX) 75 MG tablet Take 1 tablet (75 mg total) by mouth daily. 30 tablet 11   amLODipine (NORVASC) 2.5 MG tablet Take 1 tablet (2.5 mg total) by mouth daily. (Patient not taking: Reported on 04/17/2023) 90 tablet 1   Calcium Carb-Cholecalciferol (CALCIUM + D3 PO) Take 1 tablet by mouth every other day. (Patient not taking: Reported on 04/17/2023)     [DISCONTINUED] atorvastatin (LIPITOR) 20 MG tablet Take 1 tablet (20 mg total) by mouth daily. 30 tablet 6   No current facility-administered medications on file prior to visit.    Allergies  Allergen Reactions   Gadolinium Derivatives Hives, Itching and Swelling    After MultiHance (gadolinium) injection, pt began sneezing.  After exam, pt told MR tech Donnamae Jude that she was itching.  Dr. Manson Passey evaluated pt, and noticed hives on pt's back.  Pt said that she felt swelling in her throat.  Dr. Manson Passey directed that pt be taken to hospital via ambulance.     Iohexol Hives, Itching and Other (See Comments)     Code: HIVES, Desc: pts tongue began itching post injection and throat burning, Onset Date: [id number]    Proanthocyanidin Swelling  Swelling of the tongue   Grapeseed Extract [Nutritional Supplements]     Tongue swelll   Diphenhydramine Hcl Rash   Tramadol Rash    Social History   Socioeconomic History   Marital status: Divorced    Spouse name: Not on file   Number of children: 1   Years of education: Not on file   Highest education level: Not on file  Occupational History   Occupation: disabled  Tobacco Use   Smoking status: Light Smoker    Packs/day: 0.10     Years: 25.00    Additional pack years: 0.00    Total pack years: 2.50    Types: Cigarettes   Smokeless tobacco: Never   Tobacco comments:    smoke 2 a day trying to quit  Vaping Use   Vaping Use: Never used  Substance and Sexual Activity   Alcohol use: Yes    Alcohol/week: 0.0 standard drinks of alcohol    Comment: occ   Drug use: Yes    Types: Cocaine    Comment: 1-2x monthly   Sexual activity: Not Currently    Birth control/protection: Surgical  Other Topics Concern   Not on file  Social History Narrative   Not on file   Social Determinants of Health   Financial Resource Strain: Not on file  Food Insecurity: No Food Insecurity (11/10/2022)   Hunger Vital Sign    Worried About Running Out of Food in the Last Year: Never true    Ran Out of Food in the Last Year: Never true  Transportation Needs: No Transportation Needs (11/10/2022)   PRAPARE - Administrator, Civil Service (Medical): No    Lack of Transportation (Non-Medical): No  Physical Activity: Not on file  Stress: Not on file  Social Connections: Not on file  Intimate Partner Violence: Not At Risk (11/10/2022)   Humiliation, Afraid, Rape, and Kick questionnaire    Fear of Current or Ex-Partner: No    Emotionally Abused: No    Physically Abused: No    Sexually Abused: No    Family History  Problem Relation Age of Onset   Bell's palsy Mother    Hypertension Mother    Diabetes Mother    Stroke Father    Prostate cancer Father    Breast cancer Cousin    Hypertension Sister    Colon cancer Maternal Aunt    Colon cancer Paternal Aunt    Diverticulitis Sister    Diabetes Sister     Past Surgical History:  Procedure Laterality Date   ABDOMINAL HYSTERECTOMY     CESAREAN SECTION     goiter     removed   laparoscopic  1988   removal  of ectopic preg, ruptured tube   THERAPEUTIC ABORTION     THYROIDECTOMY  march 2011   cancer    ROS: Review of Systems Negative except as stated  above  PHYSICAL EXAM: BP (!) 170/98 (BP Location: Left Arm, Patient Position: Sitting, Cuff Size: Normal)   Pulse 84   Temp 98.6 F (37 C) (Oral)   Ht 5\' 5"  (1.651 m)   Wt 150 lb (68 kg)   SpO2 97%   BMI 24.96 kg/m   Physical Exam Repeat blood pressure 180/90 General appearance - alert, well appearing, and in no distress Mental status - normal mood, behavior, speech, dress, motor activity, and thought processes Neck - supple, no significant adenopathy Chest - clear to auscultation, no wheezes, rales or rhonchi, symmetric air  entry Heart - normal rate, regular rhythm, normal S1, S2, no murmurs, rubs, clicks or gallops Musculoskeletal -left thigh: No edema or erythema noted.  No soft tissue mass appreciated in the left upper inner thigh.  She has good range of motion of the left hip. Extremities -no lower extremity edema. Left foot: She has about a 3x3 cm callus on the sole of the left foot laterally about midway down.  She has a 3 cm soft movable mass on the ball of the foot.      Latest Ref Rng & Units 11/12/2022    5:06 AM 11/11/2022    2:58 AM 11/08/2022    6:49 PM  CMP  Glucose 70 - 99 mg/dL 161  096  045   BUN 6 - 20 mg/dL 16  14  11    Creatinine 0.44 - 1.00 mg/dL 4.09  8.11  9.14   Sodium 135 - 145 mmol/L 139  139  142   Potassium 3.5 - 5.1 mmol/L 4.0  3.5  3.8   Chloride 98 - 111 mmol/L 108  108  105   CO2 22 - 32 mmol/L 21  20  25    Calcium 8.9 - 10.3 mg/dL 8.7  8.4  8.5   Total Protein 6.5 - 8.1 g/dL   6.7   Total Bilirubin 0.3 - 1.2 mg/dL   0.8   Alkaline Phos 38 - 126 U/L   55   AST 15 - 41 U/L   15   ALT 0 - 44 U/L   14    Lipid Panel     Component Value Date/Time   CHOL 223 (H) 11/10/2022 0321   CHOL 235 (H) 09/24/2022 1126   TRIG 100 11/10/2022 0321   HDL 51 11/10/2022 0321   HDL 56 09/24/2022 1126   CHOLHDL 4.4 11/10/2022 0321   VLDL 20 11/10/2022 0321   LDLCALC 152 (H) 11/10/2022 0321   LDLCALC 165 (H) 09/24/2022 1126    CBC    Component  Value Date/Time   WBC 5.7 11/11/2022 0258   RBC 5.24 (H) 11/11/2022 0258   HGB 15.1 (H) 11/11/2022 0258   HGB 15.4 09/24/2022 1126   HCT 45.4 11/11/2022 0258   HCT 46.2 09/24/2022 1126   PLT 173 11/11/2022 0258   PLT 188 09/24/2022 1126   MCV 86.6 11/11/2022 0258   MCV 87 09/24/2022 1126   MCH 28.8 11/11/2022 0258   MCHC 33.3 11/11/2022 0258   RDW 12.9 11/11/2022 0258   RDW 13.1 09/24/2022 1126   LYMPHSABS 1.7 11/08/2022 1849   LYMPHSABS 2.4 04/10/2020 1140   MONOABS 0.4 11/08/2022 1849   EOSABS 0.1 11/08/2022 1849   EOSABS 0.1 04/10/2020 1140   BASOSABS 0.0 11/08/2022 1849   BASOSABS 0.0 04/10/2020 1140    ASSESSMENT AND PLAN:  1. Essential hypertension Not at goal and significantly elevated.  Cocaine use likely playing a role.  She discontinued amlodipine because it was causing dizziness.  I recommend starting Diovan instead.  DASH diet encouraged.  Strongly advised to stay away from street drug use.  Follow-up with clinical pharmacist in 2 weeks for repeat check. - Lipid panel - Comprehensive metabolic panel - valsartan (DIOVAN) 40 MG tablet; Take 1 tablet (40 mg total) by mouth daily.  Dispense: 30 tablet; Refill: 3  2. Postoperative hypothyroidism - levothyroxine (SYNTHROID) 125 MCG tablet; Take 1 tablet (125 mcg total) by mouth daily before breakfast.  Dispense: 90 tablet; Refill: 1 - TSH  3. History of CVA (cerebrovascular  accident) Continue Plavix and Crestor.- rosuvastatin (CRESTOR) 40 MG tablet; Take 1 tablet (40 mg total) by mouth daily.  Dispense: 90 tablet; Refill: 1  4. Pre-ulcerative corn or callous - Ambulatory referral to Podiatry  5. Ganglion cyst of left foot - Ambulatory referral to Podiatry  6. Urge incontinence Advised patient to call [employer] and request incontinence wipes.  They can send me the form to sign off on it.  7. Cocaine use disorder (HCC) Currently discouraged use.  This puts her at risk for another stroke and other acute  cardiovascular events.  She declines referral to a treatment program.  8. Pain of left thigh Bulb pulled muscle.  Recommend trial of a muscle relaxant.  She tells me she thinks she has Robaxin at home.  9. Encounter for screening mammogram for malignant neoplasm of breast - MM Digital Screening; Future    Patient was given the opportunity to ask questions.  Patient verbalized understanding of the plan and was able to repeat key elements of the plan.   This documentation was completed using Paediatric nurse.  Any transcriptional errors are unintentional.  Orders Placed This Encounter  Procedures   MM Digital Screening   TSH   Lipid panel   Comprehensive metabolic panel   Ambulatory referral to Podiatry     Requested Prescriptions   Signed Prescriptions Disp Refills   levothyroxine (SYNTHROID) 125 MCG tablet 90 tablet 1    Sig: Take 1 tablet (125 mcg total) by mouth daily before breakfast.   rosuvastatin (CRESTOR) 40 MG tablet 90 tablet 1    Sig: Take 1 tablet (40 mg total) by mouth daily.   valsartan (DIOVAN) 40 MG tablet 30 tablet 3    Sig: Take 1 tablet (40 mg total) by mouth daily.    Return for BP check Luke in 2 wks.  Jonah Blue, MD, FACP

## 2023-04-18 ENCOUNTER — Other Ambulatory Visit: Payer: Self-pay

## 2023-04-18 ENCOUNTER — Other Ambulatory Visit: Payer: Self-pay | Admitting: Internal Medicine

## 2023-04-18 DIAGNOSIS — E890 Postprocedural hypothyroidism: Secondary | ICD-10-CM

## 2023-04-18 LAB — COMPREHENSIVE METABOLIC PANEL
ALT: 12 IU/L (ref 0–32)
AST: 13 IU/L (ref 0–40)
Albumin/Globulin Ratio: 1.6 (ref 1.2–2.2)
Albumin: 4.5 g/dL (ref 3.8–4.9)
Alkaline Phosphatase: 79 IU/L (ref 44–121)
BUN/Creatinine Ratio: 11 (ref 9–23)
BUN: 7 mg/dL (ref 6–24)
Bilirubin Total: 0.3 mg/dL (ref 0.0–1.2)
CO2: 25 mmol/L (ref 20–29)
Calcium: 9 mg/dL (ref 8.7–10.2)
Chloride: 99 mmol/L (ref 96–106)
Creatinine, Ser: 0.63 mg/dL (ref 0.57–1.00)
Globulin, Total: 2.8 g/dL (ref 1.5–4.5)
Glucose: 90 mg/dL (ref 70–99)
Potassium: 3.7 mmol/L (ref 3.5–5.2)
Sodium: 140 mmol/L (ref 134–144)
Total Protein: 7.3 g/dL (ref 6.0–8.5)
eGFR: 103 mL/min/{1.73_m2} (ref >59)

## 2023-04-18 LAB — LIPID PANEL
Chol/HDL Ratio: 2.6 ratio (ref 0.0–4.4)
Cholesterol, Total: 157 mg/dL (ref 100–199)
HDL: 60 mg/dL (ref >39)
LDL Chol Calc (NIH): 84 mg/dL (ref 0–99)
Triglycerides: 66 mg/dL (ref 0–149)
VLDL Cholesterol Cal: 13 mg/dL (ref 5–40)

## 2023-04-18 LAB — TSH: TSH: 0.115 u[IU]/mL — ABNORMAL LOW (ref 0.450–4.500)

## 2023-04-18 MED ORDER — LEVOTHYROXINE SODIUM 100 MCG PO TABS
100.0000 ug | ORAL_TABLET | Freq: Every day | ORAL | 5 refills | Status: DC
Start: 2023-04-18 — End: 2024-01-12
  Filled 2023-04-18 – 2023-05-03 (×2): qty 30, 30d supply, fill #0
  Filled 2023-06-07: qty 30, 30d supply, fill #1
  Filled 2023-07-10 – 2023-07-12 (×2): qty 30, 30d supply, fill #2
  Filled 2023-09-02: qty 30, 30d supply, fill #3
  Filled 2023-09-02: qty 30, 30d supply, fill #0
  Filled 2023-10-06: qty 30, 30d supply, fill #1
  Filled 2023-11-26: qty 30, 30d supply, fill #2

## 2023-04-18 NOTE — Progress Notes (Signed)
Let patient know that her thyroid level is off.  I recommend holding the levothyroxine for 1 week then restarting at a lower dose of 100 mcg daily.  After being on the new dose for 4 to 6 weeks, please return to the lab for recheck of thyroid level.  An updated prescription has been sent to our pharmacy. Cholesterol levels are good. Kidney and liver function tests are good.

## 2023-04-21 DIAGNOSIS — N3946 Mixed incontinence: Secondary | ICD-10-CM | POA: Diagnosis not present

## 2023-04-21 DIAGNOSIS — R32 Unspecified urinary incontinence: Secondary | ICD-10-CM | POA: Diagnosis not present

## 2023-04-24 ENCOUNTER — Other Ambulatory Visit: Payer: Self-pay

## 2023-05-03 ENCOUNTER — Other Ambulatory Visit (HOSPITAL_COMMUNITY): Payer: Self-pay

## 2023-05-15 ENCOUNTER — Ambulatory Visit: Payer: Medicaid Other | Admitting: Podiatry

## 2023-05-15 DIAGNOSIS — M6740 Ganglion, unspecified site: Secondary | ICD-10-CM

## 2023-05-26 DIAGNOSIS — R32 Unspecified urinary incontinence: Secondary | ICD-10-CM | POA: Diagnosis not present

## 2023-05-26 DIAGNOSIS — N3946 Mixed incontinence: Secondary | ICD-10-CM | POA: Diagnosis not present

## 2023-05-31 ENCOUNTER — Other Ambulatory Visit (HOSPITAL_COMMUNITY): Payer: Self-pay

## 2023-06-02 ENCOUNTER — Other Ambulatory Visit: Payer: Self-pay

## 2023-06-03 ENCOUNTER — Ambulatory Visit: Payer: Medicaid Other | Admitting: Podiatry

## 2023-06-09 ENCOUNTER — Other Ambulatory Visit: Payer: Self-pay

## 2023-06-09 ENCOUNTER — Other Ambulatory Visit (HOSPITAL_COMMUNITY): Payer: Self-pay

## 2023-06-17 ENCOUNTER — Ambulatory Visit: Payer: Medicaid Other | Admitting: Podiatry

## 2023-06-17 DIAGNOSIS — M7752 Other enthesopathy of left foot: Secondary | ICD-10-CM | POA: Diagnosis not present

## 2023-06-19 ENCOUNTER — Other Ambulatory Visit: Payer: Self-pay

## 2023-06-20 NOTE — Progress Notes (Signed)
       Chief Complaint  Patient presents with   Callouses    new pt- came in today for corn/callous and ganglion cyst L foot. Pt sated that she gets pain sometimes but not all the time .   HPI: 57 y.o. female presents today with concern of callus to the foot.  Past Medical History:  Diagnosis Date   Abnormal Pap smear    Bronchitis    Fibroid    Headache(784.0)    Ovarian cyst    Plantar fasciitis    Seizures (HCC)    Stroke [facility])    Thyroid cancer (HCC) 20011   Trichomonas    Urinary tract infection     Past Surgical History:  Procedure Laterality Date   ABDOMINAL HYSTERECTOMY     CESAREAN SECTION     goiter     removed   laparoscopic  1988   removal  of ectopic preg, ruptured tube   THERAPEUTIC ABORTION     THYROIDECTOMY  march 2011   cancer    Allergies  Allergen Reactions   Gadolinium Derivatives Hives, Itching and Swelling    After MultiHance (gadolinium) injection, pt began sneezing.  After exam, pt told MR tech Donnamae Jude that she was itching.  Dr. Manson Passey evaluated pt, and noticed hives on pt's back.  Pt said that she felt swelling in her throat.  Dr. Manson Passey directed that pt be taken to hospital via ambulance.     Iohexol Hives, Itching and Other (See Comments)     Code: HIVES, Desc: pts tongue began itching post injection and throat burning, Onset Date: [id number]    Proanthocyanidin Swelling    Swelling of the tongue   Grapeseed Extract [Nutritional Supplements]     Tongue swelll   Diphenhydramine Hcl Rash   Tramadol Rash     Physical Exam: There were no vitals filed for this visit.  General: The patient is alert and oriented x3 in no acute distress.  Dermatology: Skin is warm, dry and supple bilateral lower extremities. Interspaces are clear of maceration and debris.  Hyperkeratotic lesion noted on the left foot.  Vascular: Palpable pedal pulses bilaterally. Capillary refill within normal limits.  No appreciable edema.  No erythema or  calor.  Neurological: Light touch sensation grossly intact bilateral feet.   Musculoskeletal Exam: No significant pedal deformities noted  Assessment/Plan of Care: 1. Capsulitis of ankle, left     Discussed clinical findings with patient today.  Callus was shaved uneventfully.  Discussed offloading methods with the patient.  Follow-up as needed   Clerance Lav, DPM, FACFAS [facility]     [address] [place], [place] [address]                Office [phone]  Fax [phone]

## 2023-07-01 DIAGNOSIS — R32 Unspecified urinary incontinence: Secondary | ICD-10-CM | POA: Diagnosis not present

## 2023-07-01 DIAGNOSIS — N3946 Mixed incontinence: Secondary | ICD-10-CM | POA: Diagnosis not present

## 2023-07-10 ENCOUNTER — Other Ambulatory Visit (HOSPITAL_COMMUNITY): Payer: Self-pay

## 2023-07-10 ENCOUNTER — Other Ambulatory Visit: Payer: Self-pay

## 2023-07-12 ENCOUNTER — Other Ambulatory Visit (HOSPITAL_COMMUNITY): Payer: Self-pay

## 2023-07-14 ENCOUNTER — Other Ambulatory Visit: Payer: Self-pay

## 2023-08-05 DIAGNOSIS — R32 Unspecified urinary incontinence: Secondary | ICD-10-CM | POA: Diagnosis not present

## 2023-08-05 DIAGNOSIS — N3946 Mixed incontinence: Secondary | ICD-10-CM | POA: Diagnosis not present

## 2023-09-02 ENCOUNTER — Other Ambulatory Visit: Payer: Self-pay

## 2023-09-02 ENCOUNTER — Other Ambulatory Visit (HOSPITAL_COMMUNITY): Payer: Self-pay

## 2023-09-03 ENCOUNTER — Other Ambulatory Visit: Payer: Self-pay

## 2023-09-17 DIAGNOSIS — R32 Unspecified urinary incontinence: Secondary | ICD-10-CM | POA: Diagnosis not present

## 2023-09-17 DIAGNOSIS — N3946 Mixed incontinence: Secondary | ICD-10-CM | POA: Diagnosis not present

## 2023-10-06 ENCOUNTER — Other Ambulatory Visit (HOSPITAL_COMMUNITY): Payer: Self-pay

## 2023-10-09 ENCOUNTER — Other Ambulatory Visit (HOSPITAL_COMMUNITY): Payer: Self-pay

## 2023-10-28 DIAGNOSIS — R32 Unspecified urinary incontinence: Secondary | ICD-10-CM | POA: Diagnosis not present

## 2023-10-28 DIAGNOSIS — N3946 Mixed incontinence: Secondary | ICD-10-CM | POA: Diagnosis not present

## 2023-11-27 ENCOUNTER — Other Ambulatory Visit (HOSPITAL_COMMUNITY): Payer: Self-pay

## 2023-12-11 DIAGNOSIS — R32 Unspecified urinary incontinence: Secondary | ICD-10-CM | POA: Diagnosis not present

## 2023-12-11 DIAGNOSIS — N3946 Mixed incontinence: Secondary | ICD-10-CM | POA: Diagnosis not present

## 2024-01-05 ENCOUNTER — Other Ambulatory Visit (HOSPITAL_COMMUNITY): Payer: Self-pay

## 2024-01-05 ENCOUNTER — Other Ambulatory Visit: Payer: Self-pay | Admitting: Internal Medicine

## 2024-01-05 ENCOUNTER — Other Ambulatory Visit: Payer: Self-pay | Admitting: Adult Health

## 2024-01-05 DIAGNOSIS — E890 Postprocedural hypothyroidism: Secondary | ICD-10-CM

## 2024-01-05 NOTE — Telephone Encounter (Unsigned)
 Copied from CRM [id number]. Topic: Clinical - Medication Refill >> Jan 05, 2024  2:22 PM Ivette P wrote: Most Recent Primary Care Visit:  Provider: Jonah Blue B  Department: [facility]  Visit Type: OFFICE VISIT  Date: 04/17/2023  Medication: levothyroxine (SYNTHROID) 100 MCG tablet  Has the patient contacted their pharmacy? Yes (Agent: If no, request that the patient contact the pharmacy for the refill. If patient does not wish to contact the pharmacy document the reason why and proceed with request.) (Agent: If yes, when and what did the pharmacy advise?)  Is this the correct pharmacy for this prescription? Yes If no, delete pharmacy and type the correct one.  This is the patient's preferred pharmacy:  [facility] [address] [place] [address] Phone: [phone] Fax: [phone]     Has the prescription been filled recently? No  Is the patient out of the medication? Yes, pt has 2 left  Has the patient been seen for an appointment in the last year OR does the patient have an upcoming appointment? Yes  Can we respond through MyChart? No  Agent: Please be advised that Rx refills may take up to 3 business days. We ask that you follow-up with your pharmacy.

## 2024-01-05 NOTE — Telephone Encounter (Signed)
 Copied from CRM [id number]. Topic: Clinical - Medication Refill >> Jan 05, 2024  2:22 PM Ivette P wrote: Most Recent Primary Care Visit:  Provider: Jonah Blue B  Department: [facility]  Visit Type: OFFICE VISIT  Date: 04/17/2023  Medication: levothyroxine (SYNTHROID) 100 MCG tablet  Has the patient contacted their pharmacy? Yes (Agent: If no, request that the patient contact the pharmacy for the refill. If patient does not wish to contact the pharmacy document the reason why and proceed with request.) (Agent: If yes, when and what did the pharmacy advise?)  Is this the correct pharmacy for this prescription? Yes If no, delete pharmacy and type the correct one.  This is the patient's preferred pharmacy:  [facility] [address] [place] [address] Phone: [phone] Fax: [phone]     Has the prescription been filled recently? No  Is the patient out of the medication? Yes, pt has 2 left  Has the patient been seen for an appointment in the last year OR does the patient have an upcoming appointment? Yes  Can we respond through MyChart? No  Agent: Please be advised that Rx refills may take up to 3 business days. We ask that you follow-up with your pharmacy.

## 2024-01-05 NOTE — Telephone Encounter (Signed)
 Last seen on 12/17/22   Per note " Advised to discontinue aspirin once Plavix restarted. Refill on plavix provided but request ongoing refills by PCP as this will be recommended lifelong unless contraindicated in the future "

## 2024-01-06 ENCOUNTER — Other Ambulatory Visit (HOSPITAL_COMMUNITY): Payer: Self-pay

## 2024-01-09 ENCOUNTER — Other Ambulatory Visit (HOSPITAL_COMMUNITY): Payer: Self-pay

## 2024-01-12 ENCOUNTER — Telehealth: Payer: Self-pay

## 2024-01-12 ENCOUNTER — Other Ambulatory Visit: Payer: Self-pay | Admitting: Internal Medicine

## 2024-01-12 DIAGNOSIS — E890 Postprocedural hypothyroidism: Secondary | ICD-10-CM

## 2024-01-12 NOTE — Telephone Encounter (Signed)
 Copied from CRM [id number]. Topic: General - Other >> Jan 09, 2024  5:08 PM Santiya F wrote: Reason for CRM: Patient is calling in because per patient she was told that Dr. Laural Benes would squeeze her in for an appointment due to her needing her Thyroid medication. Patient is requesting an immediate appointment for Monday. Please follow up with patient

## 2024-01-12 NOTE — Telephone Encounter (Signed)
 Copied from CRM [id number]. Topic: Clinical - Medication Refill >> Jan 12, 2024  9:22 AM Dimitri Ped wrote: Most Recent Primary Care Visit:  Provider: Jonah Blue B  Department: [facility]  Visit Type: OFFICE VISIT  Date: 04/17/2023  Medication: clopidogrel (PLAVIX) 75 MG tablet levothyroxine (SYNTHROID) 100 MCG tablet    Has the patient contacted their pharmacy? Yes no refills and have to call doctor (Agent: If no, request that the patient contact the pharmacy for the refill. If patient does not wish to contact the pharmacy document the reason why and proceed with request.) (Agent: If yes, when and what did the pharmacy advise?)  Is this the correct pharmacy for this prescription? Yes If no, delete pharmacy and type the correct one.  This is the patient's preferred pharmacy:  [facility] [address] [place] [address] Phone: [phone] Fax: [phone]   [facility] [address] [place] [address] Phone: [phone] Fax: [phone]  [facility] [address] [place] [address] Phone: [phone] Fax: [phone]  [facility] [address] [place] [address] Phone: [phone] Fax: [phone]   Has the prescription been filled recently? No  Is the patient out of the medication? Yes  Has the patient been seen for an appointment in the last year OR does the patient have an upcoming appointment? No  Can we respond through MyChart? No  Agent: Please be advised that Rx refills may take up to 3 business days. We ask that you follow-up with your pharmacy.

## 2024-01-12 NOTE — Telephone Encounter (Signed)
 Call to patient to offer Mychart vv for today @ 1:30 pm .Unable to reach VM.

## 2024-01-13 ENCOUNTER — Other Ambulatory Visit (HOSPITAL_COMMUNITY): Payer: Self-pay

## 2024-01-13 MED ORDER — LEVOTHYROXINE SODIUM 100 MCG PO TABS
100.0000 ug | ORAL_TABLET | Freq: Every day | ORAL | 0 refills | Status: DC
Start: 2024-01-13 — End: 2024-04-26
  Filled 2024-01-13: qty 30, 30d supply, fill #0

## 2024-01-13 MED ORDER — CLOPIDOGREL BISULFATE 75 MG PO TABS
75.0000 mg | ORAL_TABLET | Freq: Every day | ORAL | 0 refills | Status: DC
  Filled 2024-01-13: qty 30, 30d supply, fill #0

## 2024-01-13 NOTE — Telephone Encounter (Signed)
 Requested medications are due for refill today.  yes  Requested medications are on the active medications list.  yes  Last refill. varied  Future visit scheduled.   yes  Notes to clinic.  Labs are expired.    Requested Prescriptions  Pending Prescriptions Disp Refills   levothyroxine (SYNTHROID) 100 MCG tablet 30 tablet 5    Sig: Take 1 tablet (100 mcg total) by mouth daily before breakfast.     Endocrinology:  Hypothyroid Agents Failed - 01/13/2024 11:22 AM      Failed - TSH in normal range and within 360 days    TSH  Date Value Ref Range Status  04/17/2023 0.115 (L) 0.450 - 4.500 uIU/mL Final         Passed - Valid encounter within last 12 months    Recent Outpatient Visits           9 months ago Essential hypertension   [facility] [place] Marcine Matar, MD   1 year ago Hospital discharge follow-up   [facility] [place] Marcine Matar, MD   1 year ago Mixed hyperlipidemia   [facility] [facility] [facility] [place]. [facility] Marcine Matar, MD   2 years ago Chronic neck pain   [facility] [place]. [facility] Marcine Matar, MD   2 years ago Neck pain   [facility], Gildardo Pounds, NP               clopidogrel (PLAVIX) 75 MG tablet 30 tablet 11    Sig: Take 1 tablet (75 mg total) by mouth daily.     Hematology: Antiplatelets - clopidogrel Failed - 01/13/2024 11:22 AM      Failed - HCT in normal range and within 180 days    HCT  Date Value Ref Range Status  11/11/2022 45.4 36.0 - 46.0 % Final   Hematocrit  Date Value Ref Range Status  09/24/2022 46.2 34.0 - 46.6 % Final         Failed - HGB in normal range and within 180 days    Hemoglobin  Date Value Ref Range Status  11/11/2022 15.1 (H) 12.0 - 15.0 g/dL Final  16/08/9603 54.0  11.1 - 15.9 g/dL Final         Failed - PLT in normal range and within 180 days    Platelets  Date Value Ref Range Status  11/11/2022 173 150 - 400 K/uL Final  09/24/2022 188 150 - 450 x10E3/uL Final         Failed - Valid encounter within last 6 months    Recent Outpatient Visits           9 months ago Essential hypertension   [facility] [place] [place]. [facility] Marcine Matar, MD   1 year ago Hospital discharge follow-up   [facility] [place]. [facility] Marcine Matar, MD   1 year ago Mixed hyperlipidemia   [place] [facility] [place] [facility] [place]. [facility] Marcine Matar, MD   2 years ago Chronic neck pain   [facility]  [place]. [place] Marcine Matar, MD   2 years ago Neck pain   [facility] [facility], Gildardo Pounds, NP              Passed - Cr in normal range and within 360 days    Creatinine, Ser  Date Value Ref Range Status  04/17/2023 0.63 0.57 - 1.00 mg/dL Final   Creatinine, POC  Date Value Ref Range Status  07/17/2017 300 mg/dL Final

## 2024-01-20 DIAGNOSIS — R32 Unspecified urinary incontinence: Secondary | ICD-10-CM | POA: Diagnosis not present

## 2024-01-20 DIAGNOSIS — N3946 Mixed incontinence: Secondary | ICD-10-CM | POA: Diagnosis not present

## 2024-02-02 ENCOUNTER — Encounter (HOSPITAL_COMMUNITY): Payer: Self-pay | Admitting: Emergency Medicine

## 2024-02-02 ENCOUNTER — Other Ambulatory Visit: Payer: Self-pay

## 2024-02-02 ENCOUNTER — Inpatient Hospital Stay (HOSPITAL_COMMUNITY)
Admission: EM | Admit: 2024-02-02 | Discharge: 2024-02-09 | DRG: 064 | Disposition: A | Discharge status: Rehabilitation Facility | Attending: Internal Medicine | Admitting: Internal Medicine

## 2024-02-02 ENCOUNTER — Emergency Department (HOSPITAL_COMMUNITY)

## 2024-02-02 DIAGNOSIS — I6329 Cerebral infarction due to unspecified occlusion or stenosis of other precerebral arteries: Secondary | ICD-10-CM | POA: Diagnosis present

## 2024-02-02 DIAGNOSIS — I6381 Other cerebral infarction due to occlusion or stenosis of small artery: Principal | ICD-10-CM | POA: Diagnosis present

## 2024-02-02 DIAGNOSIS — R29810 Facial weakness: Secondary | ICD-10-CM | POA: Diagnosis present

## 2024-02-02 DIAGNOSIS — Z885 Allergy status to narcotic agent status: Secondary | ICD-10-CM

## 2024-02-02 DIAGNOSIS — G8194 Hemiplegia, unspecified affecting left nondominant side: Secondary | ICD-10-CM | POA: Diagnosis present

## 2024-02-02 DIAGNOSIS — Z833 Family history of diabetes mellitus: Secondary | ICD-10-CM

## 2024-02-02 DIAGNOSIS — Z7902 Long term (current) use of antithrombotics/antiplatelets: Secondary | ICD-10-CM

## 2024-02-02 DIAGNOSIS — Z823 Family history of stroke: Secondary | ICD-10-CM

## 2024-02-02 DIAGNOSIS — R030 Elevated blood-pressure reading, without diagnosis of hypertension: Secondary | ICD-10-CM

## 2024-02-02 DIAGNOSIS — Z85850 Personal history of malignant neoplasm of thyroid: Secondary | ICD-10-CM

## 2024-02-02 DIAGNOSIS — R29708 NIHSS score 8: Secondary | ICD-10-CM | POA: Diagnosis present

## 2024-02-02 DIAGNOSIS — E039 Hypothyroidism, unspecified: Secondary | ICD-10-CM | POA: Insufficient documentation

## 2024-02-02 DIAGNOSIS — Z803 Family history of malignant neoplasm of breast: Secondary | ICD-10-CM

## 2024-02-02 DIAGNOSIS — Z79899 Other long term (current) drug therapy: Secondary | ICD-10-CM

## 2024-02-02 DIAGNOSIS — F17210 Nicotine dependence, cigarettes, uncomplicated: Secondary | ICD-10-CM | POA: Diagnosis present

## 2024-02-02 DIAGNOSIS — R569 Unspecified convulsions: Secondary | ICD-10-CM | POA: Diagnosis present

## 2024-02-02 DIAGNOSIS — I639 Cerebral infarction, unspecified: Secondary | ICD-10-CM | POA: Diagnosis not present

## 2024-02-02 DIAGNOSIS — Z8249 Family history of ischemic heart disease and other diseases of the circulatory system: Secondary | ICD-10-CM

## 2024-02-02 DIAGNOSIS — H532 Diplopia: Secondary | ICD-10-CM | POA: Diagnosis present

## 2024-02-02 DIAGNOSIS — Z716 Tobacco abuse counseling: Secondary | ICD-10-CM

## 2024-02-02 DIAGNOSIS — R9082 White matter disease, unspecified: Secondary | ICD-10-CM | POA: Diagnosis not present

## 2024-02-02 DIAGNOSIS — F1410 Cocaine abuse, uncomplicated: Secondary | ICD-10-CM | POA: Diagnosis present

## 2024-02-02 DIAGNOSIS — Z888 Allergy status to other drugs, medicaments and biological substances status: Secondary | ICD-10-CM

## 2024-02-02 DIAGNOSIS — Z8042 Family history of malignant neoplasm of prostate: Secondary | ICD-10-CM

## 2024-02-02 DIAGNOSIS — I10 Essential (primary) hypertension: Secondary | ICD-10-CM | POA: Diagnosis not present

## 2024-02-02 DIAGNOSIS — Z90710 Acquired absence of both cervix and uterus: Secondary | ICD-10-CM

## 2024-02-02 DIAGNOSIS — E876 Hypokalemia: Secondary | ICD-10-CM | POA: Diagnosis present

## 2024-02-02 DIAGNOSIS — R471 Dysarthria and anarthria: Secondary | ICD-10-CM | POA: Diagnosis present

## 2024-02-02 DIAGNOSIS — E890 Postprocedural hypothyroidism: Secondary | ICD-10-CM | POA: Diagnosis present

## 2024-02-02 DIAGNOSIS — Z800 Family history of malignant neoplasm of digestive organs: Secondary | ICD-10-CM

## 2024-02-02 DIAGNOSIS — E785 Hyperlipidemia, unspecified: Secondary | ICD-10-CM | POA: Diagnosis present

## 2024-02-02 DIAGNOSIS — Z79890 Hormone replacement therapy (postmenopausal): Secondary | ICD-10-CM

## 2024-02-02 DIAGNOSIS — R7303 Prediabetes: Secondary | ICD-10-CM | POA: Diagnosis present

## 2024-02-02 DIAGNOSIS — R29818 Other symptoms and signs involving the nervous system: Secondary | ICD-10-CM | POA: Diagnosis not present

## 2024-02-02 DIAGNOSIS — I69398 Other sequelae of cerebral infarction: Secondary | ICD-10-CM

## 2024-02-02 DIAGNOSIS — R4701 Aphasia: Secondary | ICD-10-CM | POA: Diagnosis present

## 2024-02-02 LAB — I-STAT CHEM 8, ED
BUN: 8 mg/dL (ref 6–20)
Calcium, Ion: 1.06 mmol/L — ABNORMAL LOW (ref 1.15–1.40)
Chloride: 107 mmol/L (ref 98–111)
Creatinine, Ser: 0.7 mg/dL (ref 0.44–1.00)
Glucose, Bld: 92 mg/dL (ref 70–99)
HCT: 50 % — ABNORMAL HIGH (ref 36.0–46.0)
Hemoglobin: 17 g/dL — ABNORMAL HIGH (ref 12.0–15.0)
Potassium: 3.4 mmol/L — ABNORMAL LOW (ref 3.5–5.1)
Sodium: 143 mmol/L (ref 135–145)
TCO2: 25 mmol/L (ref 22–32)

## 2024-02-02 LAB — COMPREHENSIVE METABOLIC PANEL
ALT: 10 U/L (ref 0–44)
AST: 15 U/L (ref 15–41)
Albumin: 4 g/dL (ref 3.5–5.0)
Alkaline Phosphatase: 52 U/L (ref 38–126)
Anion gap: 10 (ref 5–15)
BUN: 7 mg/dL (ref 6–20)
CO2: 27 mmol/L (ref 22–32)
Calcium: 8.8 mg/dL — ABNORMAL LOW (ref 8.9–10.3)
Chloride: 106 mmol/L (ref 98–111)
Creatinine, Ser: 0.68 mg/dL (ref 0.44–1.00)
GFR, Estimated: >60 mL/min (ref >60)
Glucose, Bld: 97 mg/dL (ref 70–99)
Potassium: 3.4 mmol/L — ABNORMAL LOW (ref 3.5–5.1)
Sodium: 143 mmol/L (ref 135–145)
Total Bilirubin: 0.6 mg/dL (ref 0.0–1.2)
Total Protein: 7.2 g/dL (ref 6.5–8.1)

## 2024-02-02 LAB — DIFFERENTIAL
Abs Immature Granulocytes: 0.02 10*3/uL (ref 0.00–0.07)
Basophils Absolute: 0 10*3/uL (ref 0.0–0.1)
Basophils Relative: 0 %
Eosinophils Absolute: 0 10*3/uL (ref 0.0–0.5)
Eosinophils Relative: 0 %
Immature Granulocytes: 0 %
Lymphocytes Relative: 20 %
Lymphs Abs: 1.3 10*3/uL (ref 0.7–4.0)
Monocytes Absolute: 0.4 10*3/uL (ref 0.1–1.0)
Monocytes Relative: 6 %
Neutro Abs: 4.8 10*3/uL (ref 1.7–7.7)
Neutrophils Relative %: 74 %

## 2024-02-02 LAB — CBC
HCT: 49.8 % — ABNORMAL HIGH (ref 36.0–46.0)
Hemoglobin: 15.8 g/dL — ABNORMAL HIGH (ref 12.0–15.0)
MCH: 28.3 pg (ref 26.0–34.0)
MCHC: 31.7 g/dL (ref 30.0–36.0)
MCV: 89.1 fL (ref 80.0–100.0)
Platelets: 204 10*3/uL (ref 150–400)
RBC: 5.59 MIL/uL — ABNORMAL HIGH (ref 3.87–5.11)
RDW: 14.3 % (ref 11.5–15.5)
WBC: 6.6 10*3/uL (ref 4.0–10.5)
nRBC: 0 % (ref 0.0–0.2)

## 2024-02-02 LAB — ETHANOL: Alcohol, Ethyl (B): <10 mg/dL (ref <10)

## 2024-02-02 LAB — PROTIME-INR
INR: 1 (ref 0.8–1.2)
Prothrombin Time: 13.8 s (ref 11.4–15.2)

## 2024-02-02 LAB — APTT: aPTT: 30 s (ref 24–36)

## 2024-02-02 LAB — CBG MONITORING, ED: Glucose-Capillary: 117 mg/dL — ABNORMAL HIGH (ref 70–99)

## 2024-02-02 MED ORDER — SODIUM CHLORIDE 0.9% FLUSH: 3.0000 mL | Freq: Once | INTRAVENOUS | Status: DC

## 2024-02-02 NOTE — ED Triage Notes (Addendum)
 Patient reports slurred speech, weakness, blurry vision, decreased sensation in her RLE. Unknown LKW - patient reports that symptoms started Friday night and have been worsening. Hx of stroke - compliant with plavix.

## 2024-02-02 NOTE — ED Provider Triage Note (Signed)
 Emergency Medicine Provider Triage Evaluation Note  Theresa Watkins , a 58 y.o. female  was evaluated in triage.  Pt complains of slurred speech, left-sided weakness, blurry vision/double vision, decree sensation right lower extremity, weakness.  Last known well around Friday.  Symptoms are worsening since then.  History of multiple strokes.  Reports compliant with Plavix.  Review of Systems  Positive: Slurred speech, weakness Negative:   Physical Exam  BP (!) 151/99 (BP Location: Right Arm)   Pulse 70   Temp 98.3 F (36.8 C)   Resp 16   SpO2 97%  Gen:   Awake, no distress   Resp:  Normal effort  MSK:   Moves extremities without difficulty  Other:  Patient with slurred speech, she has left upper extremity and left lower extremity weakness, no confusion, alert and oriented x 3.  She has some left-sided EOM deficit and reports she sees multiple doubles.  Medical Decision Making  Medically screening exam initiated at 8:10 PM.  Appropriate orders placed.  Theresa Watkins was informed that the remainder of the evaluation will be completed by another provider, this initial triage assessment does not replace that evaluation, and the importance of remaining in the ED until their evaluation is complete.  Workup initiated in triage    Theresa Watkins 02/02/24 2013

## 2024-02-03 ENCOUNTER — Other Ambulatory Visit (HOSPITAL_COMMUNITY)

## 2024-02-03 ENCOUNTER — Encounter (HOSPITAL_COMMUNITY): Payer: Self-pay | Admitting: Internal Medicine

## 2024-02-03 ENCOUNTER — Observation Stay (HOSPITAL_COMMUNITY)

## 2024-02-03 ENCOUNTER — Observation Stay (HOSPITAL_BASED_OUTPATIENT_CLINIC_OR_DEPARTMENT_OTHER)

## 2024-02-03 DIAGNOSIS — I10 Essential (primary) hypertension: Secondary | ICD-10-CM

## 2024-02-03 DIAGNOSIS — F1490 Cocaine use, unspecified, uncomplicated: Secondary | ICD-10-CM

## 2024-02-03 DIAGNOSIS — R29708 NIHSS score 8: Secondary | ICD-10-CM

## 2024-02-03 DIAGNOSIS — R479 Unspecified speech disturbances: Secondary | ICD-10-CM | POA: Diagnosis not present

## 2024-02-03 DIAGNOSIS — I639 Cerebral infarction, unspecified: Secondary | ICD-10-CM | POA: Diagnosis not present

## 2024-02-03 DIAGNOSIS — F1410 Cocaine abuse, uncomplicated: Secondary | ICD-10-CM

## 2024-02-03 DIAGNOSIS — I6389 Other cerebral infarction: Secondary | ICD-10-CM | POA: Diagnosis not present

## 2024-02-03 DIAGNOSIS — E876 Hypokalemia: Secondary | ICD-10-CM

## 2024-02-03 DIAGNOSIS — I6622 Occlusion and stenosis of left posterior cerebral artery: Secondary | ICD-10-CM | POA: Diagnosis not present

## 2024-02-03 DIAGNOSIS — E039 Hypothyroidism, unspecified: Secondary | ICD-10-CM | POA: Insufficient documentation

## 2024-02-03 DIAGNOSIS — H547 Unspecified visual loss: Secondary | ICD-10-CM | POA: Diagnosis not present

## 2024-02-03 DIAGNOSIS — G459 Transient cerebral ischemic attack, unspecified: Secondary | ICD-10-CM | POA: Insufficient documentation

## 2024-02-03 DIAGNOSIS — I6381 Other cerebral infarction due to occlusion or stenosis of small artery: Secondary | ICD-10-CM | POA: Diagnosis not present

## 2024-02-03 DIAGNOSIS — R030 Elevated blood-pressure reading, without diagnosis of hypertension: Secondary | ICD-10-CM

## 2024-02-03 LAB — URINALYSIS, ROUTINE W REFLEX MICROSCOPIC
Bilirubin Urine: NEGATIVE
Glucose, UA: NEGATIVE mg/dL
Hgb urine dipstick: NEGATIVE
Ketones, ur: NEGATIVE mg/dL
Leukocytes,Ua: NEGATIVE
Nitrite: NEGATIVE
Protein, ur: 30 mg/dL — AB
Specific Gravity, Urine: 1.028 (ref 1.005–1.030)
pH: 8 (ref 5.0–8.0)

## 2024-02-03 LAB — COMPREHENSIVE METABOLIC PANEL
ALT: 10 U/L (ref 0–44)
AST: 15 U/L (ref 15–41)
Albumin: 3.5 g/dL (ref 3.5–5.0)
Alkaline Phosphatase: 52 U/L (ref 38–126)
Anion gap: 10 (ref 5–15)
BUN: 9 mg/dL (ref 6–20)
CO2: 23 mmol/L (ref 22–32)
Calcium: 8.3 mg/dL — ABNORMAL LOW (ref 8.9–10.3)
Chloride: 109 mmol/L (ref 98–111)
Creatinine, Ser: 0.64 mg/dL (ref 0.44–1.00)
GFR, Estimated: >60 mL/min (ref >60)
Glucose, Bld: 99 mg/dL (ref 70–99)
Potassium: 3.1 mmol/L — ABNORMAL LOW (ref 3.5–5.1)
Sodium: 142 mmol/L (ref 135–145)
Total Bilirubin: 0.3 mg/dL (ref 0.0–1.2)
Total Protein: 6.6 g/dL (ref 6.5–8.1)

## 2024-02-03 LAB — ECHOCARDIOGRAM COMPLETE
AR max vel: 2.95 cm2
AV Peak grad: 3.5 mmHg
Ao pk vel: 0.94 m/s
Area-P 1/2: 2.99 cm2
MV VTI: 2.01 cm2
S' Lateral: 3.4 cm
Weight: 2373.91 [oz_av]

## 2024-02-03 LAB — HEMOGLOBIN A1C
Hgb A1c MFr Bld: 5.8 % — ABNORMAL HIGH (ref 4.8–5.6)
Mean Plasma Glucose: 119.76 mg/dL

## 2024-02-03 LAB — CBC
HCT: 46.5 % — ABNORMAL HIGH (ref 36.0–46.0)
Hemoglobin: 14.9 g/dL (ref 12.0–15.0)
MCH: 28.3 pg (ref 26.0–34.0)
MCHC: 32 g/dL (ref 30.0–36.0)
MCV: 88.4 fL (ref 80.0–100.0)
Platelets: 200 10*3/uL (ref 150–400)
RBC: 5.26 MIL/uL — ABNORMAL HIGH (ref 3.87–5.11)
RDW: 14.4 % (ref 11.5–15.5)
WBC: 7.9 10*3/uL (ref 4.0–10.5)
nRBC: 0 % (ref 0.0–0.2)

## 2024-02-03 LAB — RAPID URINE DRUG SCREEN, HOSP PERFORMED
Amphetamines: NOT DETECTED
Barbiturates: NOT DETECTED
Benzodiazepines: NOT DETECTED
Cocaine: POSITIVE — AB
Opiates: NOT DETECTED
Tetrahydrocannabinol: NOT DETECTED

## 2024-02-03 LAB — HIV ANTIBODY (ROUTINE TESTING W REFLEX): HIV Screen 4th Generation wRfx: NONREACTIVE

## 2024-02-03 LAB — LIPID PANEL
Cholesterol: 164 mg/dL (ref 0–200)
HDL: 53 mg/dL (ref >40)
LDL Cholesterol: 90 mg/dL (ref 0–99)
Total CHOL/HDL Ratio: 3.1 ratio
Triglycerides: 105 mg/dL (ref <150)
VLDL: 21 mg/dL (ref 0–40)

## 2024-02-03 MED ORDER — CLOPIDOGREL BISULFATE 75 MG PO TABS
75.0000 mg | ORAL_TABLET | Freq: Every day | ORAL | Status: DC
  Administered 2024-02-03 – 2024-02-09 (×7): 75 mg via ORAL
  Filled 2024-02-03 (×7): qty 1

## 2024-02-03 MED ORDER — STROKE: EARLY STAGES OF RECOVERY BOOK
Freq: Once | Status: AC
  Filled 2024-02-03: qty 1

## 2024-02-03 MED ORDER — ASPIRIN 325 MG PO TABS: 325.0000 mg | ORAL_TABLET | Freq: Every day | ORAL | Status: DC

## 2024-02-03 MED ORDER — ORAL CARE MOUTH RINSE: 15.0000 mL | OROMUCOSAL | Status: DC | PRN

## 2024-02-03 MED ORDER — SODIUM CHLORIDE 0.9 % IV SOLN
INTRAVENOUS | Status: AC
  Administered 2024-02-03: 40 mL/h via INTRAVENOUS

## 2024-02-03 MED ORDER — ASPIRIN 300 MG RE SUPP: 300.0000 mg | Freq: Every day | RECTAL | Status: DC

## 2024-02-03 MED ORDER — ACETAMINOPHEN 160 MG/5ML PO SOLN
650.0000 mg | ORAL | Status: DC | PRN
  Administered 2024-02-07: 650 mg
  Filled 2024-02-03: qty 20.3

## 2024-02-03 MED ORDER — POTASSIUM CHLORIDE 10 MEQ/100ML IV SOLN
10.0000 meq | INTRAVENOUS | Status: AC
  Filled 2024-02-03: qty 100

## 2024-02-03 MED ORDER — LORAZEPAM 2 MG/ML IJ SOLN
1.0000 mg | Freq: Once | INTRAMUSCULAR | Status: AC
  Administered 2024-02-03: 1 mg via INTRAVENOUS
  Filled 2024-02-03 (×2): qty 1

## 2024-02-03 MED ORDER — ASPIRIN 81 MG PO TBEC
81.0000 mg | DELAYED_RELEASE_TABLET | Freq: Every day | ORAL | Status: DC
  Administered 2024-02-04 – 2024-02-09 (×6): 81 mg via ORAL
  Filled 2024-02-03 (×6): qty 1

## 2024-02-03 MED ORDER — ENOXAPARIN SODIUM 40 MG/0.4ML IJ SOSY
40.0000 mg | PREFILLED_SYRINGE | INTRAMUSCULAR | Status: DC
  Administered 2024-02-03 – 2024-02-09 (×7): 40 mg via SUBCUTANEOUS
  Filled 2024-02-03 (×7): qty 0.4

## 2024-02-03 MED ORDER — ACETAMINOPHEN 650 MG RE SUPP: 650.0000 mg | RECTAL | Status: DC | PRN

## 2024-02-03 MED ORDER — ROSUVASTATIN CALCIUM 20 MG PO TABS
40.0000 mg | ORAL_TABLET | Freq: Every day | ORAL | Status: DC
Start: 2024-02-03 — End: 2024-02-09
  Administered 2024-02-03 – 2024-02-09 (×7): 40 mg via ORAL
  Filled 2024-02-03 (×7): qty 2

## 2024-02-03 MED ORDER — POTASSIUM CHLORIDE 10 MEQ/100ML IV SOLN
10.0000 meq | Freq: Once | INTRAVENOUS | Status: DC
Start: 2024-02-03 — End: 2024-02-04

## 2024-02-03 MED ORDER — ACETAMINOPHEN 325 MG PO TABS
650.0000 mg | ORAL_TABLET | ORAL | Status: DC | PRN
  Administered 2024-02-03 – 2024-02-08 (×5): 650 mg via ORAL
  Filled 2024-02-03 (×6): qty 2

## 2024-02-03 MED ORDER — ASPIRIN 325 MG PO TBEC
325.0000 mg | DELAYED_RELEASE_TABLET | Freq: Once | ORAL | Status: AC
  Administered 2024-02-03: 325 mg via ORAL
  Filled 2024-02-03: qty 1

## 2024-02-03 NOTE — Evaluation (Signed)
 Speech Language Pathology Evaluation Patient Details Name: Theresa Watkins MRN: [id number] DOB: Sep 23, 1966 Today's Date: 02/03/2024 Time: 1410-1430 SLP Time Calculation (min) (ACUTE ONLY): 20 min  Problem List:  Patient Active Problem List   Diagnosis Date Noted   TIA (transient ischemic attack) 02/03/2024   Hypothyroidism 02/03/2024   Prediabetes 12/12/2022   Acute CVA (cerebrovascular accident) (HCC) 11/10/2022   Acute ischemic stroke (HCC) 11/09/2022   Chondromalacia patellae, 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   Unilateral primary osteoarthritis, left hip 09/25/2021   Left foot pain 08/26/2019   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   Tobacco dependence 04/09/2019   Vitamin D deficiency 02/18/2018   Toe deformity, acquired, right 02/18/2018   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   Hyperlipemia 03/21/2017   Headache 03/21/2017   Tooth pain    Cerebral infarction (HCC) 12/27/2015   HLD (hyperlipidemia)    Hemiparesis affecting dominant side as late effect of stroke (HCC)    Tobacco abuse    Cocaine use disorder (HCC)    ETOH abuse    Postoperative hypothyroidism 12/24/2015   Drug abuse, cocaine type (HCC) 05/09/2014   Hx of thyroid cancer 01/25/2014   Essential hypertension 06/03/2012   Past Medical History:  Past Medical History:  Diagnosis Date   Abnormal Pap smear    Bronchitis    Fibroid    Headache(784.0)    Ovarian cyst    Plantar fasciitis    Seizures (HCC)    Stroke (HCC)    Thyroid cancer (HCC) 20011   Trichomonas    Urinary tract infection    Past Surgical History:  Past Surgical History:  Procedure Laterality Date   ABDOMINAL HYSTERECTOMY     CESAREAN SECTION     goiter     removed   laparoscopic  1988   removal  of ectopic preg, ruptured tube   THERAPEUTIC ABORTION     THYROIDECTOMY  march 2011   cancer   HPI:  Patient is a 58 y.o. female with PMH: prior CVA, HTN, HLD, hypothyroidism, cocaine abuse. She presented to the  hospital on 02/03/24 after being found down by her son. She was brought to ER. In ER she had slurred speech and left sided weakness. CT head followed by MRI brain showed acute to subacute lacunar infarcts in the lateral right thalamus, posterior limb right external capsule but no hemorrhage or mass effect. UDS positive for cocaine.   Assessment / Plan / Recommendation Clinical Impression  Patient is presenting with moderately impaired speech intelligibility with SLP suspecting apraxia of speech as primary cause. Patient reported that even after previous strokes, her speech was not impacted and "I had perfect diction". She exhibited groping for words, strained vocal quality, part word repetitions and patient reporting that she was having trouble getting a deep breath. When she demonstrated this, SLP suspecting she is having difficulty coordinating her respiration and phonation. She did exhibit awareness to her speech impairment, showing signs of frustration when talking to SLP. When she slowed down and paced herself, she was able to improve phrase level intelligibilty. Patient was oriented x4, told SLP that "this was the worst stroke". SLP plans to evaluate her cognition further in future sessions, but suspect she is likely at or near baseline. She will benefit from skilled SLP services during acute care stay and at next venue of care.    SLP Assessment  SLP Recommendation/Assessment: Patient needs continued Speech Lanaguage Pathology Services SLP Visit Diagnosis: Apraxia (R48.2);Dysarthria and anarthria (R47.1);Cognitive communication deficit (  R41.841)    Recommendations for follow up therapy are one component of a multi-disciplinary discharge planning process, led by the attending physician.  Recommendations may be updated based on patient status, additional functional criteria and insurance authorization.    Follow Up Recommendations  Acute inpatient rehab (3hours/day)    Assistance Recommended at  Discharge     Functional Status Assessment Patient has had a recent decline in their functional status and demonstrates the ability to make significant improvements in function in a reasonable and predictable amount of time.  Frequency and Duration min 2x/week  2 weeks      SLP Evaluation Cognition  Overall Cognitive Status: No family/caregiver present to determine baseline cognitive functioning Arousal/Alertness: Awake/alert Orientation Level: Oriented to person;Oriented to place;Oriented to time;Oriented to situation Year: 2025 Month: March Day of Week: Correct Attention: Sustained Sustained Attention: Appears intact Memory: Impaired Memory Impairment: Decreased recall of new information Awareness: Impaired Awareness Impairment: Emergent impairment       Comprehension  Auditory Comprehension Overall Auditory Comprehension: Appears within functional limits for tasks assessed Conversation: Simple    Expression Expression Primary Mode of Expression: Verbal Verbal Expression Overall Verbal Expression: Other (comment) (appears largely [facility] but speech intelligibility impacts this) Level of Generative/Spontaneous Verbalization: Phrase Repetition: No impairment Naming: No impairment Pragmatics: No impairment Interfering Components: Speech intelligibility;Premorbid deficit Non-Verbal Means of Communication: Not applicable   Oral / Motor  Oral Motor/Sensory Function Overall Oral Motor/Sensory Function: Mild impairment Facial ROM: Reduced right Facial Symmetry: Abnormal symmetry right Facial Strength: Reduced right Lingual ROM: Within Functional Limits Lingual Symmetry: Within Functional Limits Lingual Strength: Within Functional Limits Mandible: Within Functional Limits Motor Speech Overall Motor Speech: Impaired Respiration: Impaired Level of Impairment: Phrase Phonation: Other (comment) (strained vocal quality) Resonance: Within functional limits Articulation:  Impaired Level of Impairment: Phrase Intelligibility: Intelligibility reduced Word: 50-74% accurate Phrase: 25-49% accurate Sentence: Not tested Motor Planning: Impaired Level of Impairment: Word Motor Speech Errors: Groping for words;Inconsistent;Aware Interfering Components: Inadequate dentition;Premorbid status Effective Techniques: Slow rate;Pacing            [name], MA, CCC-SLP Speech Therapy

## 2024-02-03 NOTE — Consult Note (Addendum)
 NEUROLOGY CONSULT NOTE   Date of service: February 03, 2024 Patient Name: Theresa Watkins MRN:  [id number] DOB:  December 26, 1965 Chief Complaint: "Slurred speech, left-sided weakness" Requesting Provider: Eduard Clos, MD  History of Present Illness  Theresa Watkins is a 58 y.o. female with hx of prior strokes with mild gait deficits as residual-strokes likely secondary to small vessel disease given uncontrolled risk factors including polysubstance abuse, tobacco abuse-presenting to the emergency department for evaluation of slurred speech and left-sided weakness and double vision. According to the patient's son who accompanied her, she was supposed to see him at his performance on Sunday but did not show up.  Her last known well is sometime before Saturday, 01/31/2024.  She reports having used cocaine on Friday and then having the symptoms. In the past she had had a paramedian pontine infarct and a left subcortical infarct with some residual gait impairment but no speech deficits or visual symptoms.  LKW: Before Saturday, 01/31/2024-exact time and date unclear Modified rankin score: 1-No significant post stroke disability and can perform usual duties with stroke symptoms IV Thrombolysis: Outside the window EVT: Outside the window  NIHSS components Score: Comment  1a Level of Conscious 0[x] 1[] 2[] 3[]     1b LOC Questions 0[x] 1[] 2[]      1c LOC Commands 0[x] 1[] 2[]      2 Best Gaze 0[] 1[x] 2[]      3 Visual 0[x] 1[] 2[] 3[]     4 Facial Palsy 0[] 1[x] 2[] 3[]     5a Motor Arm - left 0[] 1[x] 2[] 3[] 4[] UN[]   5b Motor Arm - Right 0[x] 1[] 2[] 3[] 4[] UN[]   6a Motor Leg - Left 0[] 1[x] 2[] 3[] 4[] UN[]   6b Motor Leg - Right 0[x] 1[] 2[] 3[] 4[] UN[]   7 Limb Ataxia 0[] 1[] 2[x] 3[] UN[]    8 Sensory 0[x] 1[] 2[] UN[]     9 Best Language 0[x] 1[] 2[] 3[]     10 Dysarthria 0[] 1[] 2[x] UN[]     11  Extinct. and Inattention 0[x]  1[]  2[]       TOTAL: 8      ROS  Comprehensive  ROS performed and pertinent positives documented in HPI    Past History   Past Medical History:  Diagnosis Date   Abnormal Pap smear    Bronchitis    Fibroid    Headache(784.0)    Ovarian cyst    Plantar fasciitis    Seizures (HCC)    Stroke [facility])    Thyroid cancer (HCC) 20011   Trichomonas    Urinary tract infection     Past Surgical History:  Procedure Laterality Date   ABDOMINAL HYSTERECTOMY     CESAREAN SECTION     goiter     removed   laparoscopic  1988   removal  of ectopic preg, ruptured tube   THERAPEUTIC ABORTION     THYROIDECTOMY  march 2011   cancer    Family History: Family History  Problem Relation Age of Onset   Bell's palsy Mother    Hypertension Mother    Diabetes Mother    Stroke Father    Prostate cancer Father    Breast cancer Cousin    Hypertension Sister    Colon cancer Maternal Aunt    Colon cancer Paternal Aunt    Diverticulitis Sister    Diabetes Sister     Social History  reports that she has been smoking cigarettes. She has a 2.5 pack-year smoking history. She has never used smokeless tobacco. She reports current alcohol use. She reports current drug use. Drug: Cocaine.  Allergies  Allergen Reactions   Gadolinium Derivatives Hives, Itching and Swelling    After MultiHance (gadolinium) injection, pt began sneezing.  After exam, pt told MR tech Donnamae Jude that she was itching.  Dr. Manson Passey evaluated pt, and noticed hives on pt's back.  Pt said that she felt swelling in her throat.  Dr. Manson Passey directed that pt be taken to hospital via ambulance.     Iohexol Hives, Itching and Other (See Comments)     Code: HIVES, Desc: pts tongue began itching post injection and throat burning, Onset Date: [id number]    Proanthocyanidin Swelling    Swelling of the tongue   Grapeseed Extract [Nutritional Supplements]     Tongue swelll   Diphenhydramine Hcl Rash   Tramadol Rash    Medications   Current Facility-Administered Medications:    LORazepam  (ATIVAN) injection 1 mg, 1 mg, Intravenous, Once, Dione Booze, MD   sodium chloride flush (NS) 0.9 % injection 3 mL, 3 mL, Intravenous, Once, Prosperi, Christian H, PA-C  Current Outpatient Medications:    amLODipine (NORVASC) 2.5 MG tablet, Take 1 tablet (2.5 mg total) by mouth daily. (Patient not taking: Reported on 04/17/2023), Disp: 90 tablet, Rfl: 1   Calcium Carb-Cholecalciferol (CALCIUM + D3 PO), Take 1 tablet by mouth every other day. (Patient not taking: Reported on 04/17/2023), Disp: , Rfl:    clopidogrel (PLAVIX) 75 MG tablet, Take 1 tablet (75 mg total) by mouth daily., Disp: 30 tablet, Rfl: 0   levothyroxine (SYNTHROID) 100 MCG tablet, Take 1 tablet (100 mcg total) by mouth daily before breakfast., Disp: 30 tablet, Rfl: 0   rosuvastatin (CRESTOR) 40 MG tablet, Take 1 tablet (40 mg total) by mouth daily., Disp: 90 tablet, Rfl: 1   valsartan (DIOVAN) 40 MG tablet, Take 1 tablet (40 mg total) by mouth daily., Disp: 30 tablet, Rfl: 3  Vitals   Vitals:   14-Feb-2024 1957 02/03/24 0031  BP: (!) 151/99 (!) 151/87  Pulse: 70 76  Resp: 16 (!) 24  Temp: 98.3 F (36.8 C) 99.5 F (37.5 C)  SpO2: 97% 98%    There is no height or weight on file to calculate BMI.  Physical Exam  GENERAL: Awake, alert in NAD HEENT: - Normocephalic and atraumatic, dry mm, no LN++, no Thyromegally LUNGS - Clear to auscultation bilaterally with no wheezes CV - S1S2 RRR, no m/r/g, equal pulses bilaterally. ABDOMEN - Soft, nontender, nondistended with normoactive BS NEURO:  Mental Status: AA&Ox3 Speech and Language: Speech is severely dysarthric.  No evidence of aphasia. Cranial Nerves: PERRL. EOM examination reveals mildly disconjugate gaze with the right eye not going all the way to the left, visual fields full, subtle left lower facial weakness, facial sensation intact, hearing intact, tongue/uvula/soft palate midline, normal sternocleidomastoid and trapezius muscle strength. No evidence of tongue atrophy or  fibrillations Motor: Left upper extremity 4/5 with drift.  Right upper extremity nearly full strength.  Left lower extremity also has a drift 4/5.  Right lower extremity normal strength. Tone: is normal and bulk is normal Sensation- Intact to light touch bilaterally Coordination: Ataxic left upper and lower extremity. Gait- deferred   Labs/Imaging/Neurodiagnostic studies   CBC:  Recent Labs  Lab 02-14-24 2029 14-Feb-2024 2034  WBC 6.6  --   NEUTROABS 4.8  --   HGB  15.8* 17.0*  HCT 49.8* 50.0*  MCV 89.1  --   PLT 204  --    Basic Metabolic Panel:  Lab Results  Component Value Date   NA 143 02/02/2024   K 3.4 (L) 02/02/2024   CO2 27 02/02/2024   GLUCOSE 92 02/02/2024   BUN 8 02/02/2024   CREATININE 0.70 02/02/2024   CALCIUM 8.8 (L) 02/02/2024   GFRNONAA >60 02/02/2024   GFRAA 105 08/26/2019   Lipid Panel:  Lab Results  Component Value Date   LDLCALC 84 04/17/2023   HgbA1c:  Lab Results  Component Value Date   HGBA1C 6.0 (H) 11/10/2022   Urine Drug Screen:     Component Value Date/Time   LABOPIA NONE DETECTED 11/08/2022 2000   COCAINSCRNUR POSITIVE (A) 11/08/2022 2000   LABBENZ NONE DETECTED 11/08/2022 2000   AMPHETMU NONE DETECTED 11/08/2022 2000   THCU NONE DETECTED 11/08/2022 2000   LABBARB NONE DETECTED 11/08/2022 2000    Alcohol Level     Component Value Date/Time   ETH <10 02/02/2024 2029   INR  Lab Results  Component Value Date   INR 1.0 02/02/2024   APTT  Lab Results  Component Value Date   APTT 30 02/02/2024    CT Head without contrast(Personally reviewed): Stable patchy chronic small vessel periventricular white matter disease.  No acute findings   ASSESSMENT   MIRNA SUTCLIFFE is a 58 y.o. female with prior brainstem and left subcortical infarct with residual gait deficits presenting for evaluation of left-sided weakness, slurred speech, left facial droop and blurred and double vision. On examination she has left-sided hemiparesis,  left-sided ataxia, left facial droop and moderately severe dysarthria along with disconjugate gaze. Admits to using cocaine on Friday which was likely her last known well. I fear that she has had a new stroke, either in the capsule or brainstem or the cerebellum secondary to her cocaine use causing vasospasm versus small vessel etiology due to the other risk factors.  Impression: Acute ischemic stroke  RECOMMENDATIONS  Admit to hospitalist Frequent neurochecks Telemetry She is allergic to contrast.  She will get MRI of the brain without contrast, MRA of the head without contrast and carotid Dopplers to evaluate brain structure and the head and neck vasculature. Urinary toxicology screen 2D echo A1c Lipid panel She is at least 3 days from her last known well-no need for permissive hypertension.  Goal blood pressure is normotension. Aspirin 81+ Plavix 75-duration TBD after vessel imaging. High intensity statin for goal LDL less than 70 Stroke team to follow  Plan discussed with patient, her son at bedside, Dr. Preston Fleeting in the ER and Dr. Toniann Fail over secure chat. ______________________________________________________________________    Signed, Milon Dikes, MD [facility]

## 2024-02-03 NOTE — ED Notes (Signed)
 Pt to MRI

## 2024-02-03 NOTE — ED Notes (Signed)
 Pt back in room from MRI.

## 2024-02-03 NOTE — ED Provider Notes (Signed)
 [place] EMERGENCY DEPARTMENT AT [facility] Provider Note   CSN: [id number] Arrival date & time: 02/02/24  1948     History  Chief Complaint  Patient presents with   Aphasia    Theresa Watkins is a 58 y.o. female.  The history is provided by the patient.  She has history of seizures, stroke, thyroid cancer and comes in because of loss of vision in her left eye and left-sided weakness and difficulty speaking.  Symptoms started on the day of 02/01/2024, she is unclear as to when she was last totally normal.  She does admit to using cocaine on 01/30/2024.  She denies any headache.  Son who is with her states that she was unable to stand.   Home Medications Prior to Admission medications   Medication Sig Start Date End Date Taking? Authorizing Provider  clopidogrel (PLAVIX) 75 MG tablet Take 1 tablet (75 mg total) by mouth daily. 01/13/24  Yes Marcine Matar, MD  levothyroxine (SYNTHROID) 100 MCG tablet Take 1 tablet (100 mcg total) by mouth daily before breakfast. 01/13/24  Yes Marcine Matar, MD  rosuvastatin (CRESTOR) 40 MG tablet Take 1 tablet (40 mg total) by mouth daily. 04/17/23  Yes Marcine Matar, MD  amLODipine (NORVASC) 2.5 MG tablet Take 1 tablet (2.5 mg total) by mouth daily. Patient not taking: Reported on 04/17/2023 12/12/22   Marcine Matar, MD  Calcium Carb-Cholecalciferol (CALCIUM + D3 PO) Take 1 tablet by mouth every other day. Patient not taking: Reported on 04/17/2023    [provider]  valsartan (DIOVAN) 40 MG tablet Take 1 tablet (40 mg total) by mouth daily. Patient not taking: Reported on 02/03/2024 04/17/23   Marcine Matar, MD  atorvastatin (LIPITOR) 20 MG tablet Take 1 tablet (20 mg total) by mouth daily. 09/23/20 12/06/20  Marcine Matar, MD      Allergies    Gadolinium derivatives, Iohexol, Proanthocyanidin, Grapeseed extract [nutritional supplements], Diphenhydramine hcl, and Tramadol    Review of Systems   Review  of Systems  All other systems reviewed and are negative.   Physical Exam Updated Vital Signs BP (!) 165/80   Pulse 63   Temp 99.5 F (37.5 C)   Resp (!) 21   SpO2 95%  Physical Exam Vitals and nursing note reviewed.   58 year old female, resting comfortably and in no acute distress. Vital signs are significant for elevated blood pressure and borderline elevated respiratory rate. Oxygen saturation is 95%, which is normal. Head is normocephalic and atraumatic. PERRLA, EOMI. Oropharynx is clear. Neck is nontender and supple without adenopathy or JVD. Back is nontender and there is no CVA tenderness. Lungs are clear without rales, wheezes, or rhonchi. Chest is nontender. Heart has regular rate and rhythm without murmur. Abdomen is soft, flat, nontender. Extremities have no cyanosis or edema, full range of motion is present. Skin is warm and dry without rash. Neurologic: Awake and alert, speech is dysarthric but fluent with appropriate content.  There is a questionable left central facial droop, questionable right ptosis.  Visual fields are intact to confrontation.  Remainder of cranial nerves are intact.  There is slight weakness of the left upper extremity and left lower extremity (strength 4/5).  There is mild left pronator drift.  Sensation is normal throughout, there is no extinction on double simultaneous stimulation.  She has marked ataxia on finger-nose testing on the left, no ataxia on the right.  ED Results / Procedures / Treatments  Labs (all labs ordered are listed, but only abnormal results are displayed) Labs Reviewed  CBC - Abnormal; Notable for the following components:      Result Value   RBC 5.59 (*)    Hemoglobin 15.8 (*)    HCT 49.8 (*)    All other components within normal limits  COMPREHENSIVE METABOLIC PANEL - Abnormal; Notable for the following components:   Potassium 3.4 (*)    Calcium 8.8 (*)    All other components within normal limits  CBG MONITORING,  ED - Abnormal; Notable for the following components:   Glucose-Capillary 117 (*)    All other components within normal limits  I-STAT CHEM 8, ED - Abnormal; Notable for the following components:   Potassium 3.4 (*)    Calcium, Ion 1.06 (*)    Hemoglobin 17.0 (*)    HCT 50.0 (*)    All other components within normal limits  PROTIME-INR  APTT  DIFFERENTIAL  ETHANOL  RAPID URINE DRUG SCREEN, HOSP PERFORMED  URINALYSIS, ROUTINE W REFLEX MICROSCOPIC    EKG EKG Interpretation Date/Time:  Monday February 02 2024 20:16:21 EDT Ventricular Rate:  66 PR Interval:  126 QRS Duration:  88 QT Interval:  466 QTC Calculation: 488 R Axis:   24  Text Interpretation: Normal sinus rhythm ST & T wave abnormality, consider inferolateral ischemia Prolonged QT Abnormal ECG When compared with ECG of 09-Nov-2022 18:01, ST-t abnormality is now present Confirmed by Dione Booze ([id number]) on 02/03/2024 1:34:25 AM  Radiology CT HEAD WO CONTRAST Result Date: 02/02/2024 CLINICAL DATA:  Neuro deficit, acute, stroke suspected EXAM: CT HEAD WITHOUT CONTRAST TECHNIQUE: Contiguous axial images were obtained from the base of the skull through the vertex without intravenous contrast. RADIATION DOSE REDUCTION: This exam was performed according to the departmental dose-optimization program which includes automated exposure control, adjustment of the mA and/or kV according to patient size and/or use of iterative reconstruction technique. COMPARISON:  11/09/2022 FINDINGS: Brain: Old lacunar infarcts in the left periventricular white matter and bilateral basal ganglia. Patchy low-density in the periventricular white matter bilaterally is stable. No acute intracranial abnormality. Specifically, no hemorrhage, hydrocephalus, mass lesion, acute infarction, or significant intracranial injury. Vascular: No hyperdense vessel or unexpected calcification. Skull: No acute calvarial abnormality. Sinuses/Orbits: No acute findings Other: None  IMPRESSION: Stable patchy chronic small vessel disease in the periventricular white matter. Bilateral lacunar infarcts as above. No acute intracranial abnormality. Electronically Signed   By: Charlett Nose M.D.   On: 02/02/2024 22:40    Procedures Procedures  Cardiac monitor shows normal sinus rhythm, per my interpretation.  Medications Ordered in ED Medications  sodium chloride flush (NS) 0.9 % injection 3 mL (has no administration in time range)  LORazepam (ATIVAN) injection 1 mg (has no administration in time range)    ED Course/ Medical Decision Making/ A&P                                 Medical Decision Making Amount and/or Complexity of Data Reviewed Labs: ordered. Radiology: ordered.  Risk Prescription drug management. Decision regarding hospitalization.   Stroke with left-sided weakness and ataxia.  Question cerebellar versus brainstem stroke.  Symptoms occurred within 2 days of cocaine use, concern about Kandice Hams induced vasospasm.  I have reviewed her past records, and prior strokes have been related to contemporaneous cocaine use also.  She had hospitalizations for stroke on 12/24/2015 and 11/08/2022.  MRI of the  brain on 11/09/2022 showed right pontine stroke with additional small left frontal lobe infarcts.  CT scan shows bilateral lacunar infarcts without acute intracranial abnormality.  I independently viewed the images, and agree with radiologist's interpretation.  I have reviewed her laboratory tests and my interpretation is mild hypokalemia, undetectable ethanol, mild polycythemia which had been present previously.  Current presentation seems most consistent with cerebellar or brainstem stroke, possibly secondary to vasospasm from cocaine use.  I have ordered MRI of brain and MRA of the head.  I have discussed case with Dr. Toniann Fail of [facility], who agrees to admit the patient, I have discussed case with Dr. Wilford Corner of neurology service who agrees to see the  patient in consultation.  CRITICAL CARE Performed by: Dione Booze Total critical care time: 40 minutes Critical care time was exclusive of separately billable procedures and treating other patients. Critical care was necessary to treat or prevent imminent or life-threatening deterioration. Critical care was time spent personally by me on the following activities: development of treatment plan with patient and/or surrogate as well as nursing, discussions with consultants, evaluation of patient's response to treatment, examination of patient, obtaining history from patient or surrogate, ordering and performing treatments and interventions, ordering and review of laboratory studies, ordering and review of radiographic studies, pulse oximetry and re-evaluation of patient's condition.       Final Clinical Impression(s) / ED Diagnoses Final diagnoses:  Cerebrovascular accident (CVA), unspecified mechanism (HCC)  Cocaine abuse (HCC)  Hypokalemia  Elevated blood pressure reading without diagnosis of hypertension    Rx / DC Orders ED Discharge Orders     None         Dione Booze, MD 02/03/24 [phone]

## 2024-02-03 NOTE — Progress Notes (Signed)

## 2024-02-03 NOTE — Progress Notes (Signed)
 Attempted to infuse KCL 10 MEQ PIV twice.  Both instances patient complained about pain, Used additional KVO fluids infusing along with the KCL infusion but the patient was still unable to tolerate the infusion.  Notified MD.    Also, patient has PO medications ordered but failed bedside swallow evaluation in the ED.  SLP ordered.  Messaged MD for instructions for holding or administering PO medications.

## 2024-02-03 NOTE — H&P (Signed)
 History and Physical    Theresa Watkins:[id number] DOB: 1966/04/10 DOA: 02/02/2024  Patient coming from: Home.  Chief Complaint: Blurred vision and weakness.  HPI: Theresa Watkins is a 58 y.o. female with history of prior stroke, hypertension, hyperlipidemia, hypothyroidism, cocaine abuse was found by patient's son that patient was feeling weak and has been having blurred vision when patient did not turn up for his performance and went to check on her at her house.  Also found that patient found it difficult to ambulate.  Patient was brought to the ER.  Last known well was unknown.  ED Course: In the ER patient was noticed to have slurred speech and left-sided weakness.  Neurology on-call was consulted CT head was done and followed by MRI brain which shows stroke and admitted for further stroke workup.  Patient failed stroke swallow screen.  Labs show mild hypokalemia 3.4.  Cocaine positive in urine drug screen.  EKG shows normal sinus rhythm with ST-T changes comparable to the one done in 12/23.  Review of Systems: As per HPI, rest all negative.   Past Medical History:  Diagnosis Date   Abnormal Pap smear    Bronchitis    Fibroid    Headache(784.0)    Ovarian cyst    Plantar fasciitis    Seizures (HCC)    Stroke [facility])    Thyroid cancer (HCC) 20011   Trichomonas    Urinary tract infection     Past Surgical History:  Procedure Laterality Date   ABDOMINAL HYSTERECTOMY     CESAREAN SECTION     goiter     removed   laparoscopic  1988   removal  of ectopic preg, ruptured tube   THERAPEUTIC ABORTION     THYROIDECTOMY  march 2011   cancer     reports that she has been smoking cigarettes. She has a 2.5 pack-year smoking history. She has never used smokeless tobacco. She reports current alcohol use. She reports current drug use. Drug: Cocaine.  Allergies  Allergen Reactions   Gadolinium Derivatives Hives, Itching and Swelling    After MultiHance (gadolinium) injection,  pt began sneezing.  After exam, pt told MR tech Donnamae Jude that she was itching.  Dr. Manson Passey evaluated pt, and noticed hives on pt's back.  Pt said that she felt swelling in her throat.  Dr. Manson Passey directed that pt be taken to hospital via ambulance.     Iohexol Hives, Itching and Other (See Comments)     Code: HIVES, Desc: pts tongue began itching post injection and throat burning, Onset Date: [id number]    Proanthocyanidin Swelling    Swelling of the tongue   Grapeseed Extract [Nutritional Supplements]     Tongue swelll   Diphenhydramine Hcl Rash   Tramadol Rash    Family History  Problem Relation Age of Onset   Bell's palsy Mother    Hypertension Mother    Diabetes Mother    Stroke Father    Prostate cancer Father    Breast cancer Cousin    Hypertension Sister    Colon cancer Maternal Aunt    Colon cancer Paternal Aunt    Diverticulitis Sister    Diabetes Sister     Prior to Admission medications   Medication Sig Start Date End Date Taking? Authorizing Provider  clopidogrel (PLAVIX) 75 MG tablet Take 1 tablet (75 mg total) by mouth daily. 01/13/24  Yes Marcine Matar, MD  levothyroxine (SYNTHROID) 100 MCG tablet Take 1 tablet (100 mcg  total) by mouth daily before breakfast. 01/13/24  Yes Marcine Matar, MD  rosuvastatin (CRESTOR) 40 MG tablet Take 1 tablet (40 mg total) by mouth daily. 04/17/23  Yes Marcine Matar, MD  atorvastatin (LIPITOR) 20 MG tablet Take 1 tablet (20 mg total) by mouth daily. 09/23/20 12/06/20  Marcine Matar, MD    Physical Exam: Constitutional: Moderately built and nourished. Vitals:   02/03/24 0215 02/03/24 0230 02/03/24 0421 02/03/24 0500  BP:  (!) 142/101  (!) 160/82  Pulse: 63 67  67  Resp: (!) 21 17  17   Temp:   98.5 F (36.9 C)   TempSrc:   Oral   SpO2: 95% 98%  98%   Eyes: Anicteric no pallor. ENMT: No discharge from the ears eyes nose or mouth. Neck: No mass felt.  No neck rigidity. Respiratory: No rhonchi or  crepitations. Cardiovascular: 1 S2 heard. Abdomen: Nontender bowel sound present. Musculoskeletal: No edema. Skin: No rash. Neurologic: Alert awake oriented but has difficulty communicating.  Has mild left upper extremity weakness 4 x 5 and mild left lower extremity weakness 4 x 5.  Right upper and lower extremities 5 x 5.  No facial asymmetry.  Pupils are equal and reacting to light. Psychiatric: Appears normal.  Normal affect.   Labs on Admission: I have personally reviewed following labs and imaging studies  CBC: Recent Labs  Lab 02/02/24 2029 02/02/24 2034  WBC 6.6  --   NEUTROABS 4.8  --   HGB 15.8* 17.0*  HCT 49.8* 50.0*  MCV 89.1  --   PLT 204  --    Basic Metabolic Panel: Recent Labs  Lab 02/02/24 2029 02/02/24 2034  NA 143 143  K 3.4* 3.4*  CL 106 107  CO2 27  --   GLUCOSE 97 92  BUN 7 8  CREATININE 0.68 0.70  CALCIUM 8.8*  --    GFR: CrCl cannot be calculated (Unknown ideal weight.). Liver Function Tests: Recent Labs  Lab 02/02/24 2029  AST 15  ALT 10  ALKPHOS 52  BILITOT 0.6  PROT 7.2  ALBUMIN 4.0   No results for input(s): "LIPASE", "AMYLASE" in the last 168 hours. No results for input(s): "AMMONIA" in the last 168 hours. Coagulation Profile: Recent Labs  Lab 02/02/24 2029  INR 1.0   Cardiac Enzymes: No results for input(s): "CKTOTAL", "CKMB", "CKMBINDEX", "TROPONINI" in the last 168 hours. BNP (last 3 results) No results for input(s): "PROBNP" in the last 8760 hours. HbA1C: No results for input(s): "HGBA1C" in the last 72 hours. CBG: Recent Labs  Lab 02/02/24 1953  GLUCAP 117*   Lipid Profile: No results for input(s): "CHOL", "HDL", "LDLCALC", "TRIG", "CHOLHDL", "LDLDIRECT" in the last 72 hours. Thyroid Function Tests: No results for input(s): "TSH", "T4TOTAL", "FREET4", "T3FREE", "THYROIDAB" in the last 72 hours. Anemia Panel: No results for input(s): "VITAMINB12", "FOLATE", "FERRITIN", "TIBC", "IRON", "RETICCTPCT" in the last  72 hours. Urine analysis:    Component Value Date/Time   COLORURINE YELLOW 02/03/2024 0152   APPEARANCEUR HAZY (A) 02/03/2024 0152   LABSPEC 1.028 02/03/2024 0152   PHURINE 8.0 02/03/2024 0152   GLUCOSEU NEGATIVE 02/03/2024 0152   HGBUR NEGATIVE 02/03/2024 0152   BILIRUBINUR NEGATIVE 02/03/2024 0152   BILIRUBINUR neg 07/17/2017 1530   KETONESUR NEGATIVE 02/03/2024 0152   PROTEINUR 30 (A) 02/03/2024 0152   UROBILINOGEN 0.2 07/17/2017 1530   UROBILINOGEN 0.2 05/09/2014 0902   NITRITE NEGATIVE 02/03/2024 0152   LEUKOCYTESUR NEGATIVE 02/03/2024 0152   Sepsis Labs: @LABRCNTIP (procalcitonin:4,lacticidven:4) )  No results found for this or any previous visit (from the past 240 hours).   Radiological Exams on Admission: MR ANGIO HEAD WO CONTRAST Result Date: 02/03/2024 CLINICAL DATA:  58 year old female with left side weakness, difficulty speaking, loss of vision in her left eye. Recent cocaine use. EXAM: MRA HEAD WITHOUT CONTRAST TECHNIQUE: Angiographic images of the Circle of Willis were acquired using MRA technique without intravenous contrast. COMPARISON:  Brain MRI today reported separately. Previous intracranial MRA 11/09/2022. FINDINGS: Anterior circulation: Antegrade flow in both ICA siphons. Chronic tortuosity of the distal cervical right ICA. ICA irregularity in keeping with atherosclerosis but no significant ICA stenosis. Ophthalmic artery origins appear stable and within normal limits. Posterior communicating artery origins remain patent. Infundibulum of the left posterior communicating artery origin again noted (normal variant). Patent carotid termini. Patent MCA and ACA origins. Diminutive or absent anterior communicating artery. Chronic moderate to severe distal A2 segment irregularity and stenosis which is worse on the left side. But no ACA occlusion (series 1040, image 1). This is similar to 2023. Right MCA M1 segment and bifurcation remain patent. Right MCA branches are within normal  limits. Left MCA M1 segment and bifurcation remain patent. Mild M1 irregularity. Left MCA branches are within normal limits. Posterior circulation: Antegrade flow in the posterior circulation appears stable since 2023, dominant right vertebral V4 segment. Distal vertebral arteries, left PICA, vertebrobasilar junction are patent. Patent basilar artery, AICA, SCA and PCA origins. There is distal vertebral and basilar artery irregularity suggestive of atherosclerosis, but no hemodynamically significant stenosis. Right greater than left posterior communicating arteries are present. Bilateral PCA branches are patent. But there is new severe left proximal P2 segment irregularity and stenosis since 2023 (series 1052, image 8). And additional mild to moderate distal left P2 stenosis. There is flow signal beyond those lesions. Contralateral right PCA is only mildly irregular in the P2 segment. Anatomic variants: Dominant right vertebral V4 segment. Other: Brain MRI today reported separately. IMPRESSION: 1. Negative for large vessel occlusion. Chronic intracranial irregularity compatible with age advanced atherosclerosis. 2. New since 2023 Severe Left PCA P2 segment stenosis. Chronic Moderate To Severe ACA A2 segment stenoses, worse on the left. Mild intracranial artery irregularity and stenosis otherwise. 3. MRI today reported separately. Electronically Signed   By: Odessa Fleming M.D.   On: 02/03/2024 04:28   MR BRAIN WO CONTRAST Result Date: 02/03/2024 CLINICAL DATA:  58 year old female with left side weakness, difficulty speaking, loss of vision in her left eye. Recent cocaine use. EXAM: MRI HEAD WITHOUT CONTRAST TECHNIQUE: Multiplanar, multiecho pulse sequences of the brain and surrounding structures were obtained without intravenous contrast. COMPARISON:  Intracranial MRA today reported separately. Head CT yesterday. Previous brain MRI 11/09/2022. FINDINGS: Brain: Positive for several small foci of abnormal diffusion in the  lateral right thalamus near the posterior limb external capsule which appears restricted on ADC (series 5, images 73 and 75). Underlying numerous chronic lacunar infarcts in the bilateral deep gray nuclei, left corona radiata, anterior body of the left corpus callosum. Faint T2 and FLAIR hyperintensity associated with the restricted diffusion. Several chronic microhemorrhages elsewhere in the deep gray nuclei but no acute intracranial hemorrhage. No other diffusion restriction. Chronic lacunar infarcts also in the central pons. No cortical encephalomalacia identified. No midline shift, mass effect, evidence of mass lesion, ventriculomegaly, extra-axial collection or acute intracranial hemorrhage. Cervicomedullary junction and pituitary are within normal limits. 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  But the deep gray nuclei chronic hemosiderin is stable. Vascular: Major intracranial vascular flow voids are stable since 2023. Skull and upper cervical spine: Visualized bone marrow signal is within normal limits. Chronic cervical spine disc and endplate degeneration again noted. Sinuses/Orbits: Stable, negative. Other: Negative visible scalp and face. IMPRESSION: 1. Positive for several small Acute to Subacute Lacunar Infarcts in the lateral Right thalamus, posterior limb right external capsule. No associated hemorrhage or mass effect. 2. Underlying extensive chronic small vessel disease. 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 3. MRA reported separately. Electronically Signed   By: Odessa Fleming M.D.   On: 02/03/2024 04:20   CT HEAD WO CONTRAST Result Date: 02/02/2024 CLINICAL DATA:  Neuro deficit, acute, stroke suspected EXAM: CT HEAD WITHOUT CONTRAST TECHNIQUE: Contiguous axial images were obtained from the base of the skull through the vertex  without intravenous contrast. RADIATION DOSE REDUCTION: This exam was performed according to the departmental dose-optimization program which includes automated exposure control, adjustment of the mA and/or kV according to patient size and/or use of iterative reconstruction technique. COMPARISON:  11/09/2022 FINDINGS: Brain: Old lacunar infarcts in the left periventricular white matter and bilateral basal ganglia. Patchy low-density in the periventricular white matter bilaterally is stable. No acute intracranial abnormality. Specifically, no hemorrhage, hydrocephalus, mass lesion, acute infarction, or significant intracranial injury. Vascular: No hyperdense vessel or unexpected calcification. Skull: No acute calvarial abnormality. Sinuses/Orbits: No acute findings Other: None IMPRESSION: Stable patchy chronic small vessel disease in the periventricular white matter. Bilateral lacunar infarcts as above. No acute intracranial abnormality. Electronically Signed   By: Charlett Nose M.D.   On: 02/02/2024 22:40    EKG: Independently reviewed.  Normal sinus rhythm with ST-T changes.  Assessment/Plan Principal Problem:   Acute CVA (cerebrovascular accident) [facility]) Active Problems:   Cocaine use disorder (HCC)   HLD (hyperlipidemia)   Essential hypertension   Prediabetes   Hypothyroidism    Acute CVA -     appreciate neurology consult.  Discussed with neurologist.  MRI brain does show acute CVA.  MRA brain does not show any large vessel obstruction.  Carotid Doppler is pending.  On antiplatelet agents.  Check hemoglobin A1c lipid panel 2D echo on neurochecks speech therapy evaluation. History of hypertension allow for permissive hypertension.  Follow blood pressure trends. Hyperlipidemia on statins. Hypothyroidism on Synthroid. Mild hypokalemia replace and recheck. Tobacco and cocaine abuse will need counseling.  Since patient has acute CVA will need close monitoring further workup and more than 2  midnight stay.   DVT prophylaxis: Lovenox. Code Status: Full code. Family Communication: Discussed with patient's son. Disposition Plan: Monitored bed. Consults called: Neurologist. Admission status: Observation.

## 2024-02-03 NOTE — Progress Notes (Signed)
 Admission Notes  06:25H Received patient from ED via stretcher accompanied by nurse. Patient alert and not in distress. Safety precautions started: Side rails up, bed wheels locked and call bell within reach. Tele box # [id number]

## 2024-02-03 NOTE — Evaluation (Signed)
 Clinical/Bedside Swallow Evaluation Patient Details  Name: CALEB PRIGMORE MRN: [id number] Date of Birth: 09-Jul-1966  Today's Date: 02/03/2024 Time: SLP Start Time (ACUTE ONLY): 1230 SLP Stop Time (ACUTE ONLY): 1245 SLP Time Calculation (min) (ACUTE ONLY): 15 min  Past Medical History:  Past Medical History:  Diagnosis Date   Abnormal Pap smear    Bronchitis    Fibroid    Headache(784.0)    Ovarian cyst    Plantar fasciitis    Seizures (HCC)    Stroke (HCC)    Thyroid cancer (HCC) 20011   Trichomonas    Urinary tract infection    Past Surgical History:  Past Surgical History:  Procedure Laterality Date   ABDOMINAL HYSTERECTOMY     CESAREAN SECTION     goiter     removed   laparoscopic  1988   removal  of ectopic preg, ruptured tube   THERAPEUTIC ABORTION     THYROIDECTOMY  march 2011   cancer   HPI:  Patient is a 58 y.o. female with PMH: prior CVA, HTN, HLD, hypothyroidism, cocaine abuse. She presented to the hospital on 02/03/24 after being found down by her son. She was brought to ER. In ER she had slurred speech and left sided weakness. CT head followed by MRI brain showed acute to subacute lacunar infarcts in the lateral right thalamus, posterior limb right external capsule but no hemorrhage or mass effect. UDS positive for cocaine.    Assessment / Plan / Recommendation  Clinical Impression  Patient presents with clinical s/s of dysphagia as per this BSE. She exhibited mild weakness and decrease of ROM of left bilabial and facial musculature. This led to anterior spillage of liquids and puree solids on right side and oral residuals on left side buccal cavity. Patient did exhibit awareness to residuals and anterior spillage but reaction to it was delayed. Swallow initiation was suspected to be delayed but no overt s/s aspiration when swallowing thin liquids, puree solids and mechanical soft solids. Recommendation is for PO diet of dys 2 (minced) solids, thin liquids and  SLP will follow for toleration. SLP Visit Diagnosis: Dysphagia, unspecified (R13.10)    Aspiration Risk  Mild aspiration risk    Diet Recommendation Dysphagia 2 (Fine chop);Thin liquid    Liquid Administration via: Cup;Straw Medication Administration: Whole meds with liquid Supervision: Patient able to self feed;Intermittent supervision to cue for compensatory strategies Compensations: Slow rate;Small sips/bites;Minimize environmental distractions;Lingual sweep for clearance of pocketing;Monitor for anterior loss Postural Changes: Seated upright at 90 degrees    Other  Recommendations Oral Care Recommendations: Oral care BID    Recommendations for follow up therapy are one component of a multi-disciplinary discharge planning process, led by the attending physician.  Recommendations may be updated based on patient status, additional functional criteria and insurance authorization.  Follow up Recommendations Acute inpatient rehab (3hours/day)      Assistance Recommended at Discharge    Functional Status Assessment Patient has had a recent decline in their functional status and demonstrates the ability to make significant improvements in function in a reasonable and predictable amount of time.  Frequency and Duration min 2x/week  2 weeks       Prognosis Prognosis for improved oropharyngeal function: Good      Swallow Study   General Date of Onset: 02/03/24 HPI: Patient is a 58 y.o. female with PMH: prior CVA, HTN, HLD, hypothyroidism, cocaine abuse. She presented to the hospital on 02/03/24 after being found down by her son. She  was brought to ER. In ER she had slurred speech and left sided weakness. CT head followed by MRI brain showed acute to subacute lacunar infarcts in the lateral right thalamus, posterior limb right external capsule but no hemorrhage or mass effect. UDS positive for cocaine. Type of Study: Bedside Swallow Evaluation Previous Swallow Assessment: no prior swallow  evaluations found Diet Prior to this Study: NPO Temperature Spikes Noted: No Respiratory Status: Room air History of Recent Intubation: No Behavior/Cognition: Alert;Cooperative;Pleasant mood Oral Cavity Assessment: Within Functional Limits Oral Care Completed by SLP: Recent completion by staff Oral Cavity - Dentition: Edentulous;Dentures, top Vision: Functional for self-feeding Self-Feeding Abilities: Needs set up;Able to feed self Patient Positioning: Upright in bed Baseline Vocal Quality: Other (comment) (strained) Volitional Cough: Strong Volitional Swallow: Able to elicit    Oral/Motor/Sensory Function Overall Oral Motor/Sensory Function: Mild impairment Facial ROM: Reduced right Facial Symmetry: Abnormal symmetry right Facial Strength: Reduced right Lingual ROM: Within Functional Limits Lingual Symmetry: Within Functional Limits Lingual Strength: Within Functional Limits Mandible: Within Functional Limits   Ice Chips     Thin Liquid Thin Liquid: Impaired Presentation: Self Fed;Cup Oral Phase Impairments: Reduced labial seal Oral Phase Functional Implications: Right anterior spillage Pharyngeal  Phase Impairments: Suspected delayed Swallow    Nectar Thick     Honey Thick     Puree Puree: Impaired Oral Phase Impairments: Reduced labial seal Oral Phase Functional Implications: Left anterior spillage   Solid     Solid: Impaired Presentation: Self Fed Oral Phase Functional Implications: Prolonged oral transit;Oral residue     [name], MA, CCC-SLP Speech Therapy

## 2024-02-03 NOTE — Evaluation (Signed)
 Occupational Therapy Evaluation Patient Details Name: Theresa Watkins MRN: [id number] DOB: [birth year] Today's Date: 02/03/2024   History of Present Illness   58 y.o. female presents to [facility] hospital on 02/02/2024 with weakness and blurred vision. MRI shows infarcts in the R thalamus and R posterior limb of external capsule. PMH includes CVA, HTN, HLD, hypothyroidism, cocaine abuse.     Clinical Impressions PTA pt living at home alone independently. Pt complaining of being tired because she hasn't been able to sleep; difficult to understand at times during session, however following commands and participating with OT. Pt currently requires Mod A for limited mobility and max A with ADL tasks secondary to deficits listed below. Patient will benefit from intensive inpatient follow-up therapy, >3 hours/day to maximize functional level of independence to facilitate safe DC home with assistance of caregiver. Acute OT will follow      If plan is discharge home, recommend the following:   A lot of help with bathing/dressing/bathroom;A lot of help with walking and/or transfers;Assistance with cooking/housework;Direct supervision/assist for medications management;Direct supervision/assist for financial management;Assist for transportation;Help with stairs or ramp for entrance     Functional Status Assessment   Patient has had a recent decline in their functional status and demonstrates the ability to make significant improvements in function in a reasonable and predictable amount of time.     Equipment Recommendations   None recommended by OT     Recommendations for Other Services   Rehab consult     Precautions/Restrictions   Precautions Precautions: Fall Recall of Precautions/Restrictions: Impaired Restrictions Weight Bearing Restrictions Per Provider Order: No     Mobility Bed Mobility Overal bed mobility: Needs Assistance Bed Mobility: Supine to Sit     Supine to sit:  HOB elevated, Mod assist          Transfers Overall transfer level: Needs assistance Equipment used: 1 person hand held assist Transfers: Sit to/from Stand, Bed to chair/wheelchair/BSC Sit to Stand: Mod assist Stand pivot transfers: Mod assist                Balance Overall balance assessment: Needs assistance Sitting-balance support: No upper extremity supported, Feet supported Sitting balance-Leahy Scale: Fair     Standing balance support: Single extremity supported, Reliant on assistive device for balance Standing balance-Leahy Scale: Poor Standing balance comment: minA, posterior lean; L lateral bias                           ADL either performed or assessed with clinical judgement   ADL Overall ADL's : Needs assistance/impaired Eating/Feeding: Minimal assistance   Grooming: Moderate assistance;Sitting   Upper Body Bathing: Moderate assistance;Sitting   Lower Body Bathing: Maximal assistance;Bed level   Upper Body Dressing : Maximal assistance;Sitting   Lower Body Dressing: Maximal assistance;Bed level   Toilet Transfer: Moderate assistance;Squat-pivot   Toileting- Clothing Manipulation and Hygiene: Maximal assistance       Functional mobility during ADLs: Moderate assistance       Vision Baseline Vision/History: 1 Wears glasses Ability to See in Adequate Light: 1 Impaired Patient Visual Report: Blurring of vision Vision Assessment?: Vision impaired- to be further tested in functional context Additional Comments: poor visual attention; ? L field cut Difficulty keeping eyes open     Perception Perception: Impaired Preception Impairment Details: Spatial orientation     Praxis Praxis:  (appears intact)       Pertinent Vitals/Pain Pain Assessment Pain Assessment: Faces Faces Pain  Scale: Hurts little more Pain Location: L hip Pain Descriptors / Indicators: Discomfort Pain Intervention(s): Limited activity within patient's tolerance      Extremity/Trunk Assessment Upper Extremity Assessment Upper Extremity Assessment: Right hand dominant;LUE deficits/detail RUE Deficits / Details: grossly 4/5, ROM WFL LUE:  (moving out of synergy pattern; able to touch top of head in supine - difficulty against gravity in sitting; gross grasp/release; poor in-hand manipulation skills; attempting to use as a gross assist) LUE Coordination: decreased fine motor;decreased gross motor   Lower Extremity Assessment Lower Extremity Assessment: Defer to PT evaluation LLE Deficits / Details: grossly 4-/5   Cervical / Trunk Assessment Cervical / Trunk Assessment: Other exceptions (L lateral lean/post lean)   Communication Communication Communication: Impaired Factors Affecting Communication: Difficulty expressing self;Reduced clarity of speech   Cognition Arousal: Alert Behavior During Therapy: WFL for tasks assessed/performed Cognition: Difficult to assess, Cognition impaired Difficult to assess due to: Impaired communication   Awareness: Online awareness impaired   Attention impairment (select first level of impairment): Selective attention Executive functioning impairment (select all impairments): Problem solving OT - Cognition Comments: will further assess                 Following commands: Intact       Cueing  General Comments   Cueing Techniques: Verbal cues;Tactile cues  VSS on RA   Exercises     Shoulder Instructions      Home Living Family/patient expects to be discharged to:: Private residence Living Arrangements: Alone Available Help at Discharge: Family;Available PRN/intermittently Type of Home: Apartment Home Access: Stairs to enter Entrance Stairs-Number of Steps: 3 Entrance Stairs-Rails: None Home Layout: One level     Bathroom Shower/Tub: Chief Strategy Officer: Standard Bathroom Accessibility: Yes How Accessible: Accessible via walker Home Equipment: Cane - single point           Prior Functioning/Environment Prior Level of Function : Patient poor historian/Family not available (Information from chart review)             Mobility Comments: pt reports ambulating with use of SPC. Pt is often difficult to understand throughout session due to dysarthria and expressive aphasia.      OT Problem List: Decreased strength;Decreased range of motion;Decreased activity tolerance;Impaired balance (sitting and/or standing);Impaired vision/perception;Decreased coordination;Decreased cognition;Decreased safety awareness;Decreased knowledge of use of DME or AE;Impaired sensation;Impaired tone;Impaired UE functional use;Pain   OT Treatment/Interventions: Self-care/ADL training;Therapeutic exercise;Neuromuscular education;DME and/or AE instruction;Therapeutic activities;Cognitive remediation/compensation;Visual/perceptual remediation/compensation;Patient/family education;Balance training      OT Goals(Current goals can be found in the care plan section)   Acute Rehab OT Goals Patient Stated Goal: to get better OT Goal Formulation: With patient Time For Goal Achievement: 02/17/24 Potential to Achieve Goals: Good   OT Frequency:  Min 2X/week    Co-evaluation              AM-PAC OT "6 Clicks" Daily Activity     Outcome Measure Help from another person eating meals?: A Little Help from another person taking care of personal grooming?: A Lot Help from another person toileting, which includes using toliet, bedpan, or urinal?: A Lot Help from another person bathing (including washing, rinsing, drying)?: A Lot Help from another person to put on and taking off regular upper body clothing?: A Lot Help from another person to put on and taking off regular lower body clothing?: A Lot 6 Click Score: 13   End of Session Equipment Utilized During Treatment: Gait belt Nurse Communication: Mobility status  Activity Tolerance: Patient tolerated treatment well Patient  left: in bed;with call bell/phone within reach;with bed alarm set  OT Visit Diagnosis: Unsteadiness on feet (R26.81);Other abnormalities of gait and mobility (R26.89);Muscle weakness (generalized) (M62.81);Low vision, both eyes (H54.2);Cognitive communication deficit (R41.841);Hemiplegia and hemiparesis Symptoms and signs involving cognitive functions: Cerebral infarction Hemiplegia - Right/Left: Left Hemiplegia - dominant/non-dominant: Non-Dominant Hemiplegia - caused by: Cerebral infarction                Time: 1434-1455 OT Time Calculation (min): 21 min Charges:  OT General Charges $OT Visit: 1 Visit OT Evaluation $OT Eval Moderate Complexity: 1 Mod  Valena Ivanov, OT/L   Acute OT Clinical Specialist Acute Rehabilitation Services Pager [phone] Office [phone]   [facility] 02/03/2024, 3:14 PM

## 2024-02-03 NOTE — Progress Notes (Addendum)
 STROKE TEAM PROGRESS NOTE   SUBJECTIVE (INTERVAL HISTORY) Her RN is at the bedside. Pt lying in bed, still has moderate to severe dysarthria, did not pass bedside swallow and pending swallow eval. Still has left UE drift but left LE strong. Admitted using cocaine and still smoking daily. Cessation education provided.    OBJECTIVE Temp:  [97.9 F (36.6 C)-99.5 F (37.5 C)] 97.9 F (36.6 C) (03/18 1202) Pulse Rate:  [62-78] 71 (03/18 1202) Cardiac Rhythm: Normal sinus rhythm (03/18 0700) Resp:  [16-28] 17 (03/18 0809) BP: (140-169)/(78-101) 140/81 (03/18 1202) SpO2:  [94 %-100 %] 95 % (03/18 1202) Weight:  [67.3 kg] 67.3 kg (03/18 0629)  Recent Labs  Lab 02/02/24 1953  GLUCAP 117*   Recent Labs  Lab 02/02/24 2029 02/02/24 2034 02/03/24 0552  NA 143 143 142  K 3.4* 3.4* 3.1*  CL 106 107 109  CO2 27  --  23  GLUCOSE 97 92 99  BUN 7 8 9   CREATININE 0.68 0.70 0.64  CALCIUM 8.8*  --  8.3*   Recent Labs  Lab 02/02/24 2029 02/03/24 0552  AST 15 15  ALT 10 10  ALKPHOS 52 52  BILITOT 0.6 0.3  PROT 7.2 6.6  ALBUMIN 4.0 3.5   Recent Labs  Lab 02/02/24 2029 02/02/24 2034 02/03/24 0552  WBC 6.6  --  7.9  NEUTROABS 4.8  --   --   HGB 15.8* 17.0* 14.9  HCT 49.8* 50.0* 46.5*  MCV 89.1  --  88.4  PLT 204  --  200   No results for input(s): "CKTOTAL", "CKMB", "CKMBINDEX", "TROPONINI" in the last 168 hours. Recent Labs    02/02/24 2029  LABPROT 13.8  INR 1.0   Recent Labs    02/03/24 0152  COLORURINE YELLOW  LABSPEC 1.028  PHURINE 8.0  GLUCOSEU NEGATIVE  HGBUR NEGATIVE  BILIRUBINUR NEGATIVE  KETONESUR NEGATIVE  PROTEINUR 30*  NITRITE NEGATIVE  LEUKOCYTESUR NEGATIVE       Component Value Date/Time   CHOL 164 02/03/2024 0552   CHOL 157 04/17/2023 1658   TRIG 105 02/03/2024 0552   HDL 53 02/03/2024 0552   HDL 60 04/17/2023 1658   CHOLHDL 3.1 02/03/2024 0552   VLDL 21 02/03/2024 0552   LDLCALC 90 02/03/2024 0552   LDLCALC 84 04/17/2023 1658   Lab  Results  Component Value Date   HGBA1C 5.8 (H) 02/03/2024      Component Value Date/Time   LABOPIA NONE DETECTED 02/03/2024 0152   COCAINSCRNUR POSITIVE (A) 02/03/2024 0152   LABBENZ NONE DETECTED 02/03/2024 0152   AMPHETMU NONE DETECTED 02/03/2024 0152   THCU NONE DETECTED 02/03/2024 0152   LABBARB NONE DETECTED 02/03/2024 0152    Recent Labs  Lab 02/02/24 2029  ETH <10    I have personally reviewed the radiological images below and agree with the radiology interpretations.  MR ANGIO HEAD WO CONTRAST Result Date: 02/03/2024 CLINICAL DATA:  58 year old female with left side weakness, difficulty speaking, loss of vision in her left eye. Recent cocaine use. EXAM: MRA HEAD WITHOUT CONTRAST TECHNIQUE: Angiographic images of the Circle of Willis were acquired using MRA technique without intravenous contrast. COMPARISON:  Brain MRI today reported separately. Previous intracranial MRA 11/09/2022. FINDINGS: Anterior circulation: Antegrade flow in both ICA siphons. Chronic tortuosity of the distal cervical right ICA. ICA irregularity in keeping with atherosclerosis but no significant ICA stenosis. Ophthalmic artery origins appear stable and within normal limits. Posterior communicating artery origins remain patent. Infundibulum of the left posterior  communicating artery origin again noted (normal variant). Patent carotid termini. Patent MCA and ACA origins. Diminutive or absent anterior communicating artery. Chronic moderate to severe distal A2 segment irregularity and stenosis which is worse on the left side. But no ACA occlusion (series 1040, image 1). This is similar to 2023. Right MCA M1 segment and bifurcation remain patent. Right MCA branches are within normal limits. Left MCA M1 segment and bifurcation remain patent. Mild M1 irregularity. Left MCA branches are within normal limits. Posterior circulation: Antegrade flow in the posterior circulation appears stable since 2023, dominant right  vertebral V4 segment. Distal vertebral arteries, left PICA, vertebrobasilar junction are patent. Patent basilar artery, AICA, SCA and PCA origins. There is distal vertebral and basilar artery irregularity suggestive of atherosclerosis, but no hemodynamically significant stenosis. Right greater than left posterior communicating arteries are present. Bilateral PCA branches are patent. But there is new severe left proximal P2 segment irregularity and stenosis since 2023 (series 1052, image 8). And additional mild to moderate distal left P2 stenosis. There is flow signal beyond those lesions. Contralateral right PCA is only mildly irregular in the P2 segment. Anatomic variants: Dominant right vertebral V4 segment. Other: Brain MRI today reported separately. IMPRESSION: 1. Negative for large vessel occlusion. Chronic intracranial irregularity compatible with age advanced atherosclerosis. 2. New since 2023 Severe Left PCA P2 segment stenosis. Chronic Moderate To Severe ACA A2 segment stenoses, worse on the left. Mild intracranial artery irregularity and stenosis otherwise. 3. MRI today reported separately. Electronically Signed   By: Odessa Fleming M.D.   On: 02/03/2024 04:28   MR BRAIN WO CONTRAST Result Date: 02/03/2024 CLINICAL DATA:  58 year old female with left side weakness, difficulty speaking, loss of vision in her left eye. Recent cocaine use. EXAM: MRI HEAD WITHOUT CONTRAST TECHNIQUE: Multiplanar, multiecho pulse sequences of the brain and surrounding structures were obtained without intravenous contrast. COMPARISON:  Intracranial MRA today reported separately. Head CT yesterday. Previous brain MRI 11/09/2022. FINDINGS: Brain: Positive for several small foci of abnormal diffusion in the lateral right thalamus near the posterior limb external capsule which appears restricted on ADC (series 5, images 73 and 75). Underlying numerous chronic lacunar infarcts in the bilateral deep gray nuclei, left corona radiata,  anterior body of the left corpus callosum. Faint T2 and FLAIR hyperintensity associated with the restricted diffusion. Several chronic microhemorrhages elsewhere in the deep gray nuclei but no acute intracranial hemorrhage. No other diffusion restriction. Chronic lacunar infarcts also in the central pons. No cortical encephalomalacia identified. No midline shift, mass effect, evidence of mass lesion, ventriculomegaly, extra-axial collection or acute intracranial hemorrhage. Cervicomedullary junction and pituitary are within normal limits. 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 But the deep gray nuclei chronic hemosiderin is stable. Vascular: Major intracranial vascular flow voids are stable since 2023. Skull and upper cervical spine: Visualized bone marrow signal is within normal limits. Chronic cervical spine disc and endplate degeneration again noted. Sinuses/Orbits: Stable, negative. Other: Negative visible scalp and face. IMPRESSION: 1. Positive for several small Acute to Subacute Lacunar Infarcts in the lateral Right thalamus, posterior limb right external capsule. No associated hemorrhage or mass effect. 2. Underlying extensive chronic small vessel disease. 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 3. MRA reported separately. Electronically Signed   By: Odessa Fleming M.D.   On: 02/03/2024 04:20   CT  HEAD WO CONTRAST Result Date: 02/02/2024 CLINICAL DATA:  Neuro deficit, acute, stroke suspected EXAM: CT HEAD WITHOUT CONTRAST TECHNIQUE: Contiguous axial images were obtained from the base of the skull through the vertex without intravenous contrast. RADIATION DOSE REDUCTION: This exam was performed according to the departmental dose-optimization program which includes automated exposure control, adjustment of the mA and/or kV according to patient  size and/or use of iterative reconstruction technique. COMPARISON:  11/09/2022 FINDINGS: Brain: Old lacunar infarcts in the left periventricular white matter and bilateral basal ganglia. Patchy low-density in the periventricular white matter bilaterally is stable. No acute intracranial abnormality. Specifically, no hemorrhage, hydrocephalus, mass lesion, acute infarction, or significant intracranial injury. Vascular: No hyperdense vessel or unexpected calcification. Skull: No acute calvarial abnormality. Sinuses/Orbits: No acute findings Other: None IMPRESSION: Stable patchy chronic small vessel disease in the periventricular white matter. Bilateral lacunar infarcts as above. No acute intracranial abnormality. Electronically Signed   By: Charlett Nose M.D.   On: 02/02/2024 22:40     PHYSICAL EXAM  Temp:  [97.9 F (36.6 C)-99.5 F (37.5 C)] 97.9 F (36.6 C) (03/18 1202) Pulse Rate:  [62-78] 71 (03/18 1202) Resp:  [16-28] 17 (03/18 0809) BP: (140-169)/(78-101) 140/81 (03/18 1202) SpO2:  [94 %-100 %] 95 % (03/18 1202) Weight:  [67.3 kg] 67.3 kg (03/18 0629)  General - Well nourished, well developed, in no apparent distress.  Ophthalmologic - fundi not visualized due to noncooperation.  Cardiovascular - Regular rhythm and rate.  Neuro - awake, alert, eyes open, orientated to age, place, time. No aphasia but paucity speech with moderate to severe dysarthria and soft voice, following all simple commands. Able to name and repeat in severely dysarthric voice. No gaze palsy, tracking bilaterally, visual field full. No significant facial droop. Tongue midline. RUE 4/5 at least and LUE proximal 3/5 and distally 4-/5. BLE proximal 3/5 and distal 4/5. Sensation symmetrical bilaterally, R FTN intact, L FTN ataxic but not out of proportion to the weakness. gait not tested.    ASSESSMENT/PLAN Ms. KORY PANJWANI is a 58 y.o. female with history of hypertension, substance abuse, smoker, stroke, seizure  admitted for left-sided weakness, slurred speech and diplopia. No TNK given due to also having no.    Stroke:  right PLIC infarct likely secondary to small vessel disease source CT no acute abnormality MRI right posterior limb internal capsule infarct MRA severe left P2 stenosis, moderate to severe bilateral A2 stenosis, left more than right Carotid Doppler pending 2D Echo EF 60 to 65% LDL 90 HgbA1c 5.8 UDS positive for cocaine Lovenox for VTE prophylaxis clopidogrel 75 mg daily with questionable compliance prior to admission, now on aspirin 81 mg daily and clopidogrel 75 mg daily DAPT for 3 weeks and then Plavix alone Patient counseled to be compliant with her antithrombotic medications Ongoing aggressive stroke risk factor management Therapy recommendations: Pending Disposition: Pending  History of stroke 10/2022 admitted for right pontine infarct and left frontal punctate infarct.  CT old right BG and CR infarct.  MRA bilateral A2/A3 stenosis, EF 60 to 65%, LDL 152, A1c 6.0, UDS positive for cocaine.  Discharged on DAPT and Crestor 40.  Patient had residual mild gait difficulty.  Hypertension Stable Avoid low BP Long term BP goal normotensive  Hyperlipidemia Home meds: Crestor 40, questionable compliance LDL 90, goal < 70 Now continue Crestor 40 Continue statin at discharge  Tobacco abuse Current smoker Smoking cessation counseling provided Nicotine patch provided Pt is willing to quit  Cocaine abuse UDS positive  for cocaine again Cessation indicated provided again Patient is willing to create  Other Stroke Risk Factors   Other Active Problems History of seizure not on AEDs  Hospital day # 0  I spent additional 30 minutes inpatient face-to-face time with the patient, more than 50% of which was spent in counseling and coordination of care, reviewing test results, images and medication, and discussing the diagnosis, treatment plan and potential prognosis. This  patient's care requiresreview of multiple databases, neurological assessment, discussion with family, other specialists and medical decision making of high complexity.      Marvel Plan, MD PhD Stroke Neurology 02/03/2024 12:21 PM    To contact Stroke Continuity provider, please refer to [email or web address]. After hours, contact General Neurology

## 2024-02-03 NOTE — TOC Initial Note (Signed)
 Transition of Care [facility]) - Initial/Assessment Note    Patient Details  Name: Theresa Watkins MRN: [id number] Date of Birth: 11-01-1966  [facility] CM/SW Contact:    Elberta Fortis, RN Phone Number: 02/03/2024, 11:50 AM  Clinical Narrative:  Met with pt in her room and she's alone. She lives in a home by herself. She has her mom and brother nearby that are able to transport and help her as needed. She denies any barriers to obtaining and taking her medications. She doesn't drive. She uses a walker and cane, and would like a grab bar installed by her toilet. Awaiting therapy eval's. TOC will follow.                  Expected Discharge Plan: IP Rehab Facility Barriers to Discharge: Continued Medical Work up   Patient Goals and CMS Choice            Expected Discharge Plan and Services       Living arrangements for the past 2 months: Single Family Home                                      Prior Living Arrangements/Services Living arrangements for the past 2 months: Single Family Home Lives with:: Self Patient language and need for interpreter reviewed:: Yes Do you feel safe going back to the place where you live?: Yes      Need for Family Participation in Patient Care: Yes (Comment) Care giver support system in place?: No (comment) Current home services: DME (walker, cane) Criminal Activity/Legal Involvement Pertinent to Current Situation/Hospitalization: No - Comment as needed  Activities of Daily Living   ADL Screening (condition at time of admission) Independently performs ADLs?: Yes (appropriate for developmental age) Is the patient deaf or have difficulty hearing?: No Does the patient have difficulty seeing, even when wearing glasses/contacts?: No Does the patient have difficulty concentrating, remembering, or making decisions?: No  Permission Sought/Granted                  Emotional Assessment Appearance:: Appears stated  age   Affect (typically observed): Calm, Quiet Orientation: : Oriented to Self, Oriented to Place, Oriented to Situation   Psych Involvement: No (comment)  Admission diagnosis:  Elevated blood pressure reading without diagnosis of hypertension [R03.0] TIA (transient ischemic attack) [G45.9] Hypokalemia [E87.6] Cocaine abuse (HCC) [F14.10] Acute CVA (cerebrovascular accident) (HCC) [I63.9] Cerebrovascular accident (CVA), unspecified mechanism (HCC) [I63.9] Patient Active Problem List   Diagnosis Date Noted   TIA (transient ischemic attack) 02/03/2024   Hypothyroidism 02/03/2024   Prediabetes 12/12/2022   Acute CVA (cerebrovascular accident) (HCC) 11/10/2022   Acute ischemic stroke (HCC) 11/09/2022   Chondromalacia patellae, 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   Unilateral primary osteoarthritis, left hip 09/25/2021   Left foot pain 08/26/2019   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   Tobacco dependence 04/09/2019   Vitamin D deficiency 02/18/2018   Toe deformity, acquired, right 02/18/2018   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   Hyperlipemia 03/21/2017   Headache 03/21/2017   Tooth pain    Cerebral infarction (HCC) 12/27/2015   HLD (hyperlipidemia)    Hemiparesis affecting dominant side as late effect of stroke (HCC)    Tobacco abuse    Cocaine use disorder (HCC)    ETOH abuse    Postoperative hypothyroidism 12/24/2015   Drug abuse, cocaine type (HCC) 05/09/2014  Hx of thyroid cancer 01/25/2014   Essential hypertension 06/03/2012   PCP:  Marcine Matar, MD Pharmacy:   [facility] [address], [place] - [address] [place] [address] Phone: [phone] Fax: [phone]  [facility] [address] [place] [address] Phone: [phone] Fax: [phone]  [facility] [address] [place] [address] Phone: [phone] Fax:  [phone]  [facility] - [facility] [address] [place] [address] Phone: [phone] Fax: [phone]     Social Drivers of Health (SDOH) Social History: SDOH Screenings   Food Insecurity: Food Insecurity Present (02/03/2024)  Housing: High Risk (02/03/2024)  Transportation Needs: Unmet Transportation Needs (02/03/2024)  Utilities: At Risk (02/03/2024)  Depression (PHQ2-9): Medium Risk (09/19/2022)  Tobacco Use: High Risk (02/03/2024)   SDOH Interventions:     Readmission Risk Interventions     No data to display

## 2024-02-03 NOTE — Evaluation (Signed)
 Physical Therapy Evaluation Patient Details Name: Theresa Watkins MRN: [id number] DOB: [birth year] Today's Date: 02/03/2024  History of Present Illness  58 y.o. female presents to [facility] [place] [facility] on 02/02/2024 with weakness and blurred vision. MRI shows infarcts in the R thalamus and R posterior limb of external capsule. PMH includes CVA, HTN, HLD, hypothyroidism, cocaine abuse.  Clinical Impression  Pt presents to PT with deficits in functional mobility, gait, balance, strength, power, endurance. Pt requires physical assistance to perform bed mobility and to transfer due to L sided weakness and instability. Pt typically lives alone, ambulating in the apartment with a SPC. Pt will benefit from frequent opportunities to mobilize with staff assistance in an effort to reduce falls risk. Patient will benefit from intensive inpatient follow-up therapy, >3 hours/day.        If plan is discharge home, recommend the following: A lot of help with walking and/or transfers;A lot of help with bathing/dressing/bathroom;Assistance with cooking/housework;Assist for transportation;Help with stairs or ramp for entrance   Can travel by private vehicle        Equipment Recommendations Rolling walker (2 wheels);BSC/3in1  Recommendations for Other Services  Rehab consult    Functional Status Assessment Patient has had a recent decline in their functional status and demonstrates the ability to make significant improvements in function in a reasonable and predictable amount of time.     Precautions / Restrictions Precautions Precautions: Fall Recall of Precautions/Restrictions: Impaired Restrictions Weight Bearing Restrictions Per Provider Order: No      Mobility  Bed Mobility Overal bed mobility: Needs Assistance Bed Mobility: Supine to Sit     Supine to sit: HOB elevated, Min assist          Transfers Overall transfer level: Needs assistance Equipment used: 1 person hand held assist Transfers:  Sit to/from Stand, Bed to chair/wheelchair/BSC Sit to Stand: Min assist Stand pivot transfers: Mod assist              Ambulation/Gait                  Stairs            Wheelchair Mobility     Tilt Bed    Modified Rankin (Stroke Patients Only) Modified Rankin (Stroke Patients Only) Pre-Morbid Rankin Score: Moderate disability Modified Rankin: Moderately severe disability     Balance Overall balance assessment: Needs assistance Sitting-balance support: No upper extremity supported, Feet supported Sitting balance-Leahy Scale: Fair     Standing balance support: Single extremity supported, Reliant on assistive device for balance Standing balance-Leahy Scale: Poor Standing balance comment: minA, posterior lean                             Pertinent Vitals/Pain Pain Assessment Pain Assessment: No/denies pain    Home Living Family/patient expects to be discharged to:: Private residence Living Arrangements: Alone Available Help at Discharge: Family;Available PRN/intermittently Type of Home: Apartment           Home Equipment: Cane - single point      Prior Function Prior Level of Function : Patient poor historian/Family not available             Mobility Comments: pt reports ambulating with use of SPC. Pt is often difficult to understand throughout session due to dysarthria and expressive aphasia.       Extremity/Trunk Assessment   Upper Extremity Assessment Upper Extremity Assessment: RUE deficits/detail;LUE deficits/detail RUE Deficits / Details:  grossly 4/5, ROM WFL LUE Deficits / Details: grossly 4-/5, ROM WFL    Lower Extremity Assessment Lower Extremity Assessment: LLE deficits/detail LLE Deficits / Details: grossly 4-/5    Cervical / Trunk Assessment Cervical / Trunk Assessment: Normal  Communication   Communication Communication: Impaired Factors Affecting Communication: Difficulty expressing self;Reduced  clarity of speech    Cognition Arousal: Alert Behavior During Therapy: WFL for tasks assessed/performed   PT - Cognitive impairments: Difficult to assess, Memory, Problem solving, Safety/Judgement Difficult to assess due to: Impaired communication                       Following commands: Intact       Cueing Cueing Techniques: Verbal cues     General Comments General comments (skin integrity, edema, etc.): VSS on RA    Exercises     Assessment/Plan    PT Assessment Patient needs continued PT services  PT Problem List Decreased strength;Decreased activity tolerance;Decreased balance;Decreased mobility;Decreased knowledge of use of DME;Decreased safety awareness;Decreased knowledge of precautions       PT Treatment Interventions DME instruction;Gait training;Stair training;Functional mobility training;Therapeutic activities;Therapeutic exercise;Balance training;Cognitive remediation;Neuromuscular re-education;Patient/family education    PT Goals (Current goals can be found in the Care Plan section)  Acute Rehab PT Goals Patient Stated Goal: to return to prior level of function PT Goal Formulation: With patient Time For Goal Achievement: 02/17/24 Potential to Achieve Goals: Fair    Frequency Min 3X/week     Co-evaluation               AM-PAC PT "6 Clicks" Mobility  Outcome Measure Help needed turning from your back to your side while in a flat bed without using bedrails?: A Little Help needed moving from lying on your back to sitting on the side of a flat bed without using bedrails?: A Little Help needed moving to and from a bed to a chair (including a wheelchair)?: A Lot Help needed standing up from a chair using your arms (e.g., wheelchair or bedside chair)?: A Little Help needed to walk in hospital room?: Total Help needed climbing 3-5 steps with a railing? : Total 6 Click Score: 13    End of Session Equipment Utilized During Treatment: Gait  belt Activity Tolerance: Patient tolerated treatment well Patient left: in chair;with call bell/phone within reach;with chair alarm set Nurse Communication: Mobility status PT Visit Diagnosis: Other abnormalities of gait and mobility (R26.89);Muscle weakness (generalized) (M62.81)    Time: 9518-8416 PT Time Calculation (min) (ACUTE ONLY): 15 min   Charges:   PT Evaluation $PT Eval Low Complexity: 1 Low   PT General Charges $$ ACUTE PT VISIT: 1 Visit         Arlyss Gandy, PT, DPT Acute Rehabilitation Office [phone]   Arlyss Gandy 02/03/2024, 2:09 PM

## 2024-02-03 NOTE — Progress Notes (Signed)
 [facility]                                                                              Theresa Watkins, is a 58 y.o. female, DOB - 09-04-1966, WUJ:[id number] Admit date - 02/02/2024    Outpatient Primary MD for the patient is Theresa Matar, MD  LOS - 0  days  Chief Complaint  Patient presents with   Aphasia       Brief summary   Patient is a 58 year old female with history of prior stroke, HTN, hyperlipidemia, hypothyroidism, cocaine abuse was found by patient's son that patient was feeling weak and has been having blurred vision when patient did not turn up for his performance and went to check on her at her house.  Also found that patient found it difficult to ambulate.  Patient was brought to the ER.  Last known well was unknown.  ED Course: In the ER patient was noticed to have slurred speech and left-sided weakness.   CT head showed stable patchy chronic small vessel disease in the periventricular white matter, bilateral lacunar infarcts.  Neurology was consulted and patient was admitted for further workup.  Cocaine positive in urine drug screen.    Assessment & Plan    Principal Problem:   Acute CVA (cerebrovascular accident) (HCC) -Presented with blurry vision, slurred speech and left-sided weakness -MRI brain showed several small acute to subacute lacunar infarct in the right lateral thalamus, posterior limb of the right external capsule, no associated hemorrhage or mass effect.  Underlying extensive chronic small vessel disease. -MRA head negative for large vessel occlusion, EEG presents atherosclerosis, severe left PCA P2 segment stenosis new since 2023.  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, worse on the left -Neurology consulted -PT, ST, SLP evaluation -Continue aspirin, Plavix, statin -Counseled on cocaine cessation -Hemoglobin A1c 5.8, LDL 90 -Follow 2D echo  Active Problems: Tobacco, cocaine use disorder (HCC) -UDS positive  for cocaine -Needs counseling, TOC for substance abuse    HLD (hyperlipidemia) -LDL 90, continue statin    Essential hypertension -Hold antihypertensives, allow permissive hypertension    Hypothyroidism Continue Synthroid  Mild hypokalemia -Replaced  Estimated body mass index is 24.69 kg/m as calculated from the following:   Height as of 04/17/23: 5\' 5"  (1.651 m).   Weight as of this encounter: 67.3 kg.  Code Status: Full code DVT Prophylaxis:  enoxaparin (LOVENOX) injection 40 mg Start: 02/03/24 1000   Level of Care: Level of care: Telemetry Medical Family Communication:  Disposition Plan:      Remains inpatient appropriate:      Procedures:  MRI, MRA brain  Consultants:   Neurology  Antimicrobials:   Anti-infectives (From admission, onward)    None          Medications  [START ON 02/04/2024]  stroke: early stages of recovery book   Does not apply Once   aspirin EC  325 mg Oral Once   Followed by   [START ON 02/04/2024] aspirin EC  81 mg Oral Daily   clopidogrel  75 mg Oral Daily   enoxaparin (LOVENOX) injection  40 mg Subcutaneous Q24H  rosuvastatin  40 mg Oral Daily   sodium chloride flush  3 mL Intravenous Once      Subjective:   Theresa Watkins was seen and examined today.  Still has severe dysarthria, left-sided weakness.  Patient denies dizziness, chest pain, shortness of breath, abdominal pain, N/V  Objective:   Vitals:   02/03/24 0500 02/03/24 0629 02/03/24 0809 02/03/24 1202  BP: (!) 160/82 (!) 169/78 (!) 168/96 (!) 140/81  Pulse: 67 78 62 71  Resp: 17 17 17    Temp:  98 F (36.7 C) 99.4 F (37.4 C) 97.9 F (36.6 C)  TempSrc:   Oral Oral  SpO2: 98% 100% 94% 95%  Weight:  67.3 kg     No intake or output data in the 24 hours ending 02/03/24 1256   Wt Readings from Last 3 Encounters:  02/03/24 67.3 kg  04/17/23 68 kg  12/17/22 72.7 kg     Exam General: Alert, awake, severely dysarthric, oriented x 3 Cardiovascular: S1 S2  auscultated,  RRR Respiratory: Clear to auscultation bilaterally, no wheezing Gastrointestinal: Soft, nontender, nondistended, + bowel sounds Ext: no pedal edema bilaterally Neuro: LUE, LLE, 4/5, normal right side, severely dysarthric Psych: Normal affect     Data Reviewed:  I have personally reviewed following labs    CBC Lab Results  Component Value Date   WBC 7.9 02/03/2024   RBC 5.26 (H) 02/03/2024   HGB 14.9 02/03/2024   HCT 46.5 (H) 02/03/2024   MCV 88.4 02/03/2024   MCH 28.3 02/03/2024   PLT 200 02/03/2024   MCHC 32.0 02/03/2024   RDW 14.4 02/03/2024   LYMPHSABS 1.3 02/02/2024   MONOABS 0.4 02/02/2024   EOSABS 0.0 02/02/2024   BASOSABS 0.0 02/02/2024     Last metabolic panel Lab Results  Component Value Date   NA 142 02/03/2024   K 3.1 (L) 02/03/2024   CL 109 02/03/2024   CO2 23 02/03/2024   BUN 9 02/03/2024   CREATININE 0.64 02/03/2024   GLUCOSE 99 02/03/2024   GFRNONAA >60 02/03/2024   GFRAA 105 08/26/2019   CALCIUM 8.3 (L) 02/03/2024   PROT 6.6 02/03/2024   ALBUMIN 3.5 02/03/2024   LABGLOB 2.8 04/17/2023   AGRATIO 1.6 04/17/2023   BILITOT 0.3 02/03/2024   ALKPHOS 52 02/03/2024   AST 15 02/03/2024   ALT 10 02/03/2024   ANIONGAP 10 02/03/2024    CBG (last 3)  Recent Labs    02/02/24 1953  GLUCAP 117*      Coagulation Profile: Recent Labs  Lab 02/02/24 2029  INR 1.0     Radiology Studies: I have personally reviewed the imaging studies  ECHOCARDIOGRAM COMPLETE Result Date: 02/03/2024    ECHOCARDIOGRAM REPORT   Patient Name:   Theresa Watkins Date of Exam: 02/03/2024 Medical Rec #:  [id number]         Height:       65.0 in Accession #:    [id number]        Weight:       148.4 lb Date of Birth:  [birth year]         BSA:          1.742 m Patient Age:    57 years          BP:           69/78 mmHg Patient Gender: F                 HR:  71 bpm. Exam Location:  Inpatient Procedure: 2D Echo, Color Doppler and Cardiac Doppler (Both  Spectral and Color            Flow Doppler were utilized during procedure). Indications:    Stroke  History:        Patient has prior history of Echocardiogram examinations. Stroke                 and TIA; Risk Factors:Hypertension.  Sonographer:    Lamont Snowball Referring Phys: [id number] ARSHAD N KAKRAKANDY IMPRESSIONS  1. Left ventricular ejection fraction, by estimation, is 60 to 65%. The left ventricle has normal function. The left ventricle has no regional wall motion abnormalities. Left ventricular diastolic parameters were normal.  2. Right ventricular systolic function is normal. The right ventricular size is normal.  3. The mitral valve is normal in structure. No evidence of mitral valve regurgitation. No evidence of mitral stenosis.  4. The aortic valve is tricuspid. Aortic valve regurgitation is not visualized. No aortic stenosis is present.  5. The inferior vena cava is normal in size with greater than 50% respiratory variability, suggesting right atrial pressure of 3 mmHg. FINDINGS  Left Ventricle: Left ventricular ejection fraction, by estimation, is 60 to 65%. The left ventricle has normal function. The left ventricle has no regional wall motion abnormalities. The left ventricular internal cavity size was normal in size. There is  no left ventricular hypertrophy. Left ventricular diastolic parameters were normal. Right Ventricle: The right ventricular size is normal. Right ventricular systolic function is normal. Left Atrium: Left atrial size was normal in size. Right Atrium: Right atrial size was normal in size. Pericardium: There is no evidence of pericardial effusion. Mitral Valve: The mitral valve is normal in structure. No evidence of mitral valve regurgitation. No evidence of mitral valve stenosis. MV peak gradient, 3.3 mmHg. The mean mitral valve gradient is 1.0 mmHg. Tricuspid Valve: The tricuspid valve is normal in structure. Tricuspid valve regurgitation is not demonstrated. No evidence of  tricuspid stenosis. Aortic Valve: The aortic valve is tricuspid. Aortic valve regurgitation is not visualized. No aortic stenosis is present. Aortic valve peak gradient measures 3.5 mmHg. Pulmonic Valve: The pulmonic valve was not well visualized. Pulmonic valve regurgitation is not visualized. No evidence of pulmonic stenosis. Aorta: The aortic root is normal in size and structure. Venous: The inferior vena cava is normal in size with greater than 50% respiratory variability, suggesting right atrial pressure of 3 mmHg. IAS/Shunts: The interatrial septum is aneurysmal. No atrial level shunt detected by color flow Doppler.  LEFT VENTRICLE PLAX 2D LVIDd:         4.60 cm   Diastology LVIDs:         3.40 cm   LV e' medial:    8.49 cm/s LV PW:         0.90 cm   LV E/e' medial:  9.6 LV IVS:        0.90 cm   LV e' lateral:   11.00 cm/s LVOT diam:     2.20 cm   LV E/e' lateral: 7.4 LV SV:         62 LV SV Index:   35 LVOT Area:     3.80 cm  RIGHT VENTRICLE             IVC RV Basal diam:  3.20 cm     IVC diam: 1.10 cm RV S prime:     10.00 cm/s TAPSE (M-mode): 1.6 cm  LEFT ATRIUM             Index        RIGHT ATRIUM           Index LA Vol (A2C):   45.0 ml 25.83 ml/m  RA Area:     10.30 cm LA Vol (A4C):   34.6 ml 19.86 ml/m  RA Volume:   21.80 ml  12.51 ml/m LA Biplane Vol: 37.3 ml 21.41 ml/m  AORTIC VALVE AV Area (Vmax): 2.95 cm AV Vmax:        93.80 cm/s AV Peak Grad:   3.5 mmHg LVOT Vmax:      72.70 cm/s LVOT Vmean:     47.000 cm/s LVOT VTI:       0.162 m  AORTA Ao Root diam: 3.20 cm Ao Asc diam:  3.20 cm MITRAL VALVE MV Area (PHT): 2.99 cm    SHUNTS MV Area VTI:   2.01 cm    Systemic VTI:  0.16 m MV Peak grad:  3.3 mmHg    Systemic Diam: 2.20 cm MV Mean grad:  1.0 mmHg MV Vmax:       0.91 m/s MV Vmean:      55.1 cm/s MV Decel Time: 254 msec MV E velocity: 81.80 cm/s MV A velocity: 76.00 cm/s MV E/A ratio:  1.08 Olga Millers MD Electronically signed by Olga Millers MD Signature Date/Time: 02/03/2024/12:50:13 PM     Final    MR ANGIO HEAD WO CONTRAST Result Date: 02/03/2024 CLINICAL DATA:  58 year old female with left side weakness, difficulty speaking, loss of vision in her left eye. Recent cocaine use. EXAM: MRA HEAD WITHOUT CONTRAST TECHNIQUE: Angiographic images of the Circle of Willis were acquired using MRA technique without intravenous contrast. COMPARISON:  Brain MRI today reported separately. Previous intracranial MRA 11/09/2022. FINDINGS: Anterior circulation: Antegrade flow in both ICA siphons. Chronic tortuosity of the distal cervical right ICA. ICA irregularity in keeping with atherosclerosis but no significant ICA stenosis. Ophthalmic artery origins appear stable and within normal limits. Posterior communicating artery origins remain patent. Infundibulum of the left posterior communicating artery origin again noted (normal variant). Patent carotid termini. Patent MCA and ACA origins. Diminutive or absent anterior communicating artery. Chronic moderate to severe distal A2 segment irregularity and stenosis which is worse on the left side. But no ACA occlusion (series 1040, image 1). This is similar to 2023. Right MCA M1 segment and bifurcation remain patent. Right MCA branches are within normal limits. Left MCA M1 segment and bifurcation remain patent. Mild M1 irregularity. Left MCA branches are within normal limits. Posterior circulation: Antegrade flow in the posterior circulation appears stable since 2023, dominant right vertebral V4 segment. Distal vertebral arteries, left PICA, vertebrobasilar junction are patent. Patent basilar artery, AICA, SCA and PCA origins. There is distal vertebral and basilar artery irregularity suggestive of atherosclerosis, but no hemodynamically significant stenosis. Right greater than left posterior communicating arteries are present. Bilateral PCA branches are patent. But there is new severe left proximal P2 segment irregularity and stenosis since 2023 (series 1052, image 8).  And additional mild to moderate distal left P2 stenosis. There is flow signal beyond those lesions. Contralateral right PCA is only mildly irregular in the P2 segment. Anatomic variants: Dominant right vertebral V4 segment. Other: Brain MRI today reported separately. IMPRESSION: 1. Negative for large vessel occlusion. Chronic intracranial irregularity compatible with age advanced atherosclerosis. 2. New since 2023 Severe Left PCA P2 segment stenosis. Chronic Moderate To Severe ACA A2 segment stenoses, worse  on the left. Mild intracranial artery irregularity and stenosis otherwise. 3. MRI today reported separately. Electronically Signed   By: Odessa Fleming M.D.   On: 02/03/2024 04:28   MR BRAIN WO CONTRAST Result Date: 02/03/2024 CLINICAL DATA:  58 year old female with left side weakness, difficulty speaking, loss of vision in her left eye. Recent cocaine use. EXAM: MRI HEAD WITHOUT CONTRAST TECHNIQUE: Multiplanar, multiecho pulse sequences of the brain and surrounding structures were obtained without intravenous contrast. COMPARISON:  Intracranial MRA today reported separately. Head CT yesterday. Previous brain MRI 11/09/2022. FINDINGS: Brain: Positive for several small foci of abnormal diffusion in the lateral right thalamus near the posterior limb external capsule which appears restricted on ADC (series 5, images 73 and 75). Underlying numerous chronic lacunar infarcts in the bilateral deep gray nuclei, left corona radiata, anterior body of the left corpus callosum. Faint T2 and FLAIR hyperintensity associated with the restricted diffusion. Several chronic microhemorrhages elsewhere in the deep gray nuclei but no acute intracranial hemorrhage. No other diffusion restriction. Chronic lacunar infarcts also in the central pons. No cortical encephalomalacia identified. No midline shift, mass effect, evidence of mass lesion, ventriculomegaly, extra-axial collection or acute intracranial hemorrhage. Cervicomedullary  junction and pituitary are within normal limits. 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 But the deep gray nuclei chronic hemosiderin is stable. Vascular: Major intracranial vascular flow voids are stable since 2023. Skull and upper cervical spine: Visualized bone marrow signal is within normal limits. Chronic cervical spine disc and endplate degeneration again noted. Sinuses/Orbits: Stable, negative. Other: Negative visible scalp and face. IMPRESSION: 1. Positive for several small Acute to Subacute Lacunar Infarcts in the lateral Right thalamus, posterior limb right external capsule. No associated hemorrhage or mass effect. 2. Underlying extensive chronic small vessel disease. 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 3. MRA reported separately. Electronically Signed   By: Odessa Fleming M.D.   On: 02/03/2024 04:20   CT HEAD WO CONTRAST Result Date: 02/02/2024 CLINICAL DATA:  Neuro deficit, acute, stroke suspected EXAM: CT HEAD WITHOUT CONTRAST TECHNIQUE: Contiguous axial images were obtained from the base of the skull through the vertex without intravenous contrast. RADIATION DOSE REDUCTION: This exam was performed according to the departmental dose-optimization program which includes automated exposure control, adjustment of the mA and/or kV according to patient size and/or use of iterative reconstruction technique. COMPARISON:  11/09/2022 FINDINGS: Brain: Old lacunar infarcts in the left periventricular white matter and bilateral basal ganglia. Patchy low-density in the periventricular white matter bilaterally is stable. No acute intracranial abnormality. Specifically, no hemorrhage, hydrocephalus, mass lesion, acute infarction, or significant intracranial injury. Vascular: No hyperdense vessel or unexpected calcification. Skull: No acute calvarial  abnormality. Sinuses/Orbits: No acute findings Other: None IMPRESSION: Stable patchy chronic small vessel disease in the periventricular white matter. Bilateral lacunar infarcts as above. No acute intracranial abnormality. Electronically Signed   By: Charlett Nose M.D.   On: 02/02/2024 22:40       Mayrene Bastarache M.D. [facility] 02/03/2024, 12:56 PM  Available via Epic secure chat 7am-7pm After 7 pm, please refer to night coverage provider listed on amion.

## 2024-02-04 DIAGNOSIS — Z885 Allergy status to narcotic agent status: Secondary | ICD-10-CM | POA: Diagnosis not present

## 2024-02-04 DIAGNOSIS — F17210 Nicotine dependence, cigarettes, uncomplicated: Secondary | ICD-10-CM

## 2024-02-04 DIAGNOSIS — R32 Unspecified urinary incontinence: Secondary | ICD-10-CM | POA: Diagnosis not present

## 2024-02-04 DIAGNOSIS — I639 Cerebral infarction, unspecified: Secondary | ICD-10-CM | POA: Diagnosis not present

## 2024-02-04 DIAGNOSIS — Z8249 Family history of ischemic heart disease and other diseases of the circulatory system: Secondary | ICD-10-CM | POA: Diagnosis not present

## 2024-02-04 DIAGNOSIS — Z79890 Hormone replacement therapy (postmenopausal): Secondary | ICD-10-CM | POA: Diagnosis not present

## 2024-02-04 DIAGNOSIS — G441 Vascular headache, not elsewhere classified: Secondary | ICD-10-CM | POA: Diagnosis not present

## 2024-02-04 DIAGNOSIS — I63511 Cerebral infarction due to unspecified occlusion or stenosis of right middle cerebral artery: Secondary | ICD-10-CM | POA: Diagnosis not present

## 2024-02-04 DIAGNOSIS — R509 Fever, unspecified: Secondary | ICD-10-CM | POA: Diagnosis not present

## 2024-02-04 DIAGNOSIS — F329 Major depressive disorder, single episode, unspecified: Secondary | ICD-10-CM | POA: Diagnosis not present

## 2024-02-04 DIAGNOSIS — K5900 Constipation, unspecified: Secondary | ICD-10-CM | POA: Diagnosis not present

## 2024-02-04 DIAGNOSIS — I6329 Cerebral infarction due to unspecified occlusion or stenosis of other precerebral arteries: Secondary | ICD-10-CM | POA: Diagnosis not present

## 2024-02-04 DIAGNOSIS — I6389 Other cerebral infarction: Secondary | ICD-10-CM | POA: Diagnosis not present

## 2024-02-04 DIAGNOSIS — Z7902 Long term (current) use of antithrombotics/antiplatelets: Secondary | ICD-10-CM | POA: Diagnosis not present

## 2024-02-04 DIAGNOSIS — H538 Other visual disturbances: Secondary | ICD-10-CM | POA: Diagnosis not present

## 2024-02-04 DIAGNOSIS — M19041 Primary osteoarthritis, right hand: Secondary | ICD-10-CM | POA: Diagnosis not present

## 2024-02-04 DIAGNOSIS — I69354 Hemiplegia and hemiparesis following cerebral infarction affecting left non-dominant side: Secondary | ICD-10-CM | POA: Diagnosis not present

## 2024-02-04 DIAGNOSIS — I10 Essential (primary) hypertension: Secondary | ICD-10-CM | POA: Diagnosis not present

## 2024-02-04 DIAGNOSIS — E876 Hypokalemia: Secondary | ICD-10-CM | POA: Diagnosis not present

## 2024-02-04 DIAGNOSIS — Z800 Family history of malignant neoplasm of digestive organs: Secondary | ICD-10-CM | POA: Diagnosis not present

## 2024-02-04 DIAGNOSIS — R448 Other symptoms and signs involving general sensations and perceptions: Secondary | ICD-10-CM | POA: Diagnosis not present

## 2024-02-04 DIAGNOSIS — R569 Unspecified convulsions: Secondary | ICD-10-CM | POA: Diagnosis not present

## 2024-02-04 DIAGNOSIS — R7303 Prediabetes: Secondary | ICD-10-CM | POA: Diagnosis not present

## 2024-02-04 DIAGNOSIS — H532 Diplopia: Secondary | ICD-10-CM | POA: Diagnosis not present

## 2024-02-04 DIAGNOSIS — I69391 Dysphagia following cerebral infarction: Secondary | ICD-10-CM | POA: Diagnosis not present

## 2024-02-04 DIAGNOSIS — R1310 Dysphagia, unspecified: Secondary | ICD-10-CM | POA: Diagnosis not present

## 2024-02-04 DIAGNOSIS — N3289 Other specified disorders of bladder: Secondary | ICD-10-CM | POA: Diagnosis not present

## 2024-02-04 DIAGNOSIS — R030 Elevated blood-pressure reading, without diagnosis of hypertension: Secondary | ICD-10-CM | POA: Diagnosis not present

## 2024-02-04 DIAGNOSIS — E890 Postprocedural hypothyroidism: Secondary | ICD-10-CM | POA: Diagnosis not present

## 2024-02-04 DIAGNOSIS — G8114 Spastic hemiplegia affecting left nondominant side: Secondary | ICD-10-CM | POA: Diagnosis not present

## 2024-02-04 DIAGNOSIS — R252 Cramp and spasm: Secondary | ICD-10-CM | POA: Diagnosis not present

## 2024-02-04 DIAGNOSIS — F1410 Cocaine abuse, uncomplicated: Secondary | ICD-10-CM | POA: Diagnosis present

## 2024-02-04 DIAGNOSIS — Z716 Tobacco abuse counseling: Secondary | ICD-10-CM | POA: Diagnosis not present

## 2024-02-04 DIAGNOSIS — R471 Dysarthria and anarthria: Secondary | ICD-10-CM | POA: Diagnosis not present

## 2024-02-04 DIAGNOSIS — R519 Headache, unspecified: Secondary | ICD-10-CM | POA: Diagnosis not present

## 2024-02-04 DIAGNOSIS — Z79899 Other long term (current) drug therapy: Secondary | ICD-10-CM | POA: Diagnosis not present

## 2024-02-04 DIAGNOSIS — M792 Neuralgia and neuritis, unspecified: Secondary | ICD-10-CM | POA: Diagnosis not present

## 2024-02-04 DIAGNOSIS — Z888 Allergy status to other drugs, medicaments and biological substances status: Secondary | ICD-10-CM | POA: Diagnosis not present

## 2024-02-04 DIAGNOSIS — G8194 Hemiplegia, unspecified affecting left nondominant side: Secondary | ICD-10-CM | POA: Diagnosis not present

## 2024-02-04 DIAGNOSIS — Z85850 Personal history of malignant neoplasm of thyroid: Secondary | ICD-10-CM | POA: Diagnosis not present

## 2024-02-04 DIAGNOSIS — E785 Hyperlipidemia, unspecified: Secondary | ICD-10-CM | POA: Diagnosis not present

## 2024-02-04 DIAGNOSIS — R4701 Aphasia: Secondary | ICD-10-CM | POA: Diagnosis not present

## 2024-02-04 DIAGNOSIS — I6381 Other cerebral infarction due to occlusion or stenosis of small artery: Secondary | ICD-10-CM | POA: Diagnosis not present

## 2024-02-04 DIAGNOSIS — R1312 Dysphagia, oropharyngeal phase: Secondary | ICD-10-CM | POA: Diagnosis not present

## 2024-02-04 DIAGNOSIS — Z833 Family history of diabetes mellitus: Secondary | ICD-10-CM | POA: Diagnosis not present

## 2024-02-04 DIAGNOSIS — F1910 Other psychoactive substance abuse, uncomplicated: Secondary | ICD-10-CM | POA: Diagnosis not present

## 2024-02-04 DIAGNOSIS — R29810 Facial weakness: Secondary | ICD-10-CM | POA: Diagnosis not present

## 2024-02-04 LAB — BASIC METABOLIC PANEL
Anion gap: 6 (ref 5–15)
BUN: 9 mg/dL (ref 6–20)
CO2: 24 mmol/L (ref 22–32)
Calcium: 7.9 mg/dL — ABNORMAL LOW (ref 8.9–10.3)
Chloride: 110 mmol/L (ref 98–111)
Creatinine, Ser: 0.61 mg/dL (ref 0.44–1.00)
GFR, Estimated: >60 mL/min (ref >60)
Glucose, Bld: 86 mg/dL (ref 70–99)
Potassium: 3.4 mmol/L — ABNORMAL LOW (ref 3.5–5.1)
Sodium: 140 mmol/L (ref 135–145)

## 2024-02-04 LAB — CBC
HCT: 44.3 % (ref 36.0–46.0)
Hemoglobin: 14.6 g/dL (ref 12.0–15.0)
MCH: 28.4 pg (ref 26.0–34.0)
MCHC: 33 g/dL (ref 30.0–36.0)
MCV: 86.2 fL (ref 80.0–100.0)
Platelets: 170 10*3/uL (ref 150–400)
RBC: 5.14 MIL/uL — ABNORMAL HIGH (ref 3.87–5.11)
RDW: 14.2 % (ref 11.5–15.5)
WBC: 5.6 10*3/uL (ref 4.0–10.5)
nRBC: 0 % (ref 0.0–0.2)

## 2024-02-04 MED ORDER — POTASSIUM CHLORIDE CRYS ER 20 MEQ PO TBCR
40.0000 meq | EXTENDED_RELEASE_TABLET | Freq: Once | ORAL | Status: AC
  Administered 2024-02-04: 40 meq via ORAL
  Filled 2024-02-04: qty 2

## 2024-02-04 MED ORDER — LEVOTHYROXINE SODIUM 100 MCG PO TABS
100.0000 ug | ORAL_TABLET | Freq: Every day | ORAL | Status: DC
  Administered 2024-02-05 – 2024-02-09 (×5): 100 ug via ORAL
  Filled 2024-02-04 (×5): qty 1

## 2024-02-04 NOTE — Progress Notes (Signed)
 Inpatient Rehab Coordinator Note:  I met with patient at bedside to discuss CIR recommendations and goals/expectations of CIR stay.  We reviewed 3 hrs/day of therapy, physician follow up, and average length of stay 2 weeks (dependent upon progress) with goals of supervision.  Pt severely dysarthric, but able to indicate yes/no with near 100% intelligibility.  At first she states she doesn't have any family in this area, but on further questioning it appears her son and her mother do live locally.  I've left a message with her mother to determine caregiver support at discharge.   Estill Dooms, PT, DPT Admissions Coordinator [phone] 02/04/24  11:34 AM

## 2024-02-04 NOTE — Progress Notes (Signed)
 [facility]                                                                              Theresa Watkins, is a 58 y.o. female, DOB - 1966-06-13, ZOX:[id number] Admit date - 02/02/2024    Outpatient Primary MD for the patient is Marcine Matar, MD  LOS - 0  days  Chief Complaint  Patient presents with   Aphasia       Brief summary   Patient is a 58 year old female with history of prior stroke, HTN, hyperlipidemia, hypothyroidism, cocaine abuse was found by patient's son that patient was feeling weak and has been having blurred vision when patient did not turn up for his performance and went to check on her at her house.  Also found that patient found it difficult to ambulate.  Patient was brought to the ER.  Last known well was unknown.  ED Course: In the ER patient was noticed to have slurred speech and left-sided weakness.   CT head showed stable patchy chronic small vessel disease in the periventricular white matter, bilateral lacunar infarcts.  Neurology was consulted and patient was admitted for further workup.  Cocaine positive in urine drug screen.    Assessment & Plan    Principal Problem:   Acute CVA (cerebrovascular accident) (HCC) -Presented with blurry vision, slurred speech and left-sided weakness -MRI brain showed several small acute to subacute lacunar infarct in the right lateral thalamus, posterior limb of the right external capsule, no associated hemorrhage or mass effect.  Underlying extensive chronic small vessel disease. -MRA head negative for large vessel occlusion, EEG presents atherosclerosis, severe left PCA P2 segment stenosis new since 2023.  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, worse on the left -Neurology consulted, recommended aspirin 81 mg daily, Plavix 75 mg daily for 3 months then Plavix alone -PT evaluation recommended  CIR -SLP evaluation recommended dysphagia 2 diet with thin liquids -Counseled on cocaine  cessation -Hemoglobin A1c 5.8, LDL 90, continue statin -2D echo showed EF of 60 to 65%, normal diastolic parameters, no regional WMA -Still with significant expressive aphasia  Active Problems: Tobacco, cocaine use disorder (HCC) -UDS positive for cocaine -Needs counseling, TOC for substance abuse    HLD (hyperlipidemia) -LDL 90, continue Crestor 40 mg daily    Essential hypertension -Hold antihypertensives, allow permissive hypertension    Hypothyroidism Continue Synthroid  Mild hypokalemia -Replaced  Estimated body mass index is 24.69 kg/m as calculated from the following:   Height as of this encounter: 5\' 5"  (1.651 m).   Weight as of this encounter: 67.3 kg.  Code Status: Full code DVT Prophylaxis:  enoxaparin (LOVENOX) injection 40 mg Start: 02/03/24 1000   Level of Care: Level of care: Telemetry Medical Family Communication:  Disposition Plan:      Remains inpatient appropriate:   Needs CIR   Procedures:  MRI, MRA brain  Consultants:   Neurology  Antimicrobials:   Anti-infectives (From admission, onward)    None          Medications  aspirin EC  81 mg Oral Daily   clopidogrel  75 mg Oral Daily  enoxaparin (LOVENOX) injection  40 mg Subcutaneous Q24H   [START ON 02/05/2024] levothyroxine  100 mcg Oral Q0600   rosuvastatin  40 mg Oral Daily   sodium chloride flush  3 mL Intravenous Once      Subjective:   Theresa Watkins was seen and examined today.  Still with severe dysarthria, difficult to obtain review of system from the patient.  No nausea vomiting, chest pain, shortness of breath, fevers.    Objective:   Vitals:   02/04/24 0003 02/04/24 0335 02/04/24 0744 02/04/24 1157  BP: (!) 158/81 (!) 145/87 (!) 152/95 138/88  Pulse: 78 67 76 72  Resp: 18 17 17 19   Temp: 97.9 F (36.6 C) 97.8 F (36.6 C) 99.2 F (37.3 C) 98.6 F (37 C)  TempSrc:   Oral Oral  SpO2: 95% 93% 96% 90%  Weight:      Height:        Intake/Output Summary  (Last 24 hours) at 02/04/2024 1307 Last data filed at 02/04/2024 0419 Gross per 24 hour  Intake 486.02 ml  Output --  Net 486.02 ml     Wt Readings from Last 3 Encounters:  02/03/24 67.3 kg  04/17/23 68 kg  12/17/22 72.7 kg   Physical Exam General: Alert and awake, follows commands, severe dysarthria Cardiovascular: S1 S2 clear, RRR.  Respiratory: CTAB, no wheezing, rales or rhonchi Gastrointestinal: Soft, nontender, nondistended, NBS Ext: no pedal edema bilaterally Neuro: moderate to severe dysarthria, left-sided weakness    Data Reviewed:  I have personally reviewed following labs    CBC Lab Results  Component Value Date   WBC 5.6 02/04/2024   RBC 5.14 (H) 02/04/2024   HGB 14.6 02/04/2024   HCT 44.3 02/04/2024   MCV 86.2 02/04/2024   MCH 28.4 02/04/2024   PLT 170 02/04/2024   MCHC 33.0 02/04/2024   RDW 14.2 02/04/2024   LYMPHSABS 1.3 02/02/2024   MONOABS 0.4 02/02/2024   EOSABS 0.0 02/02/2024   BASOSABS 0.0 02/02/2024     Last metabolic panel Lab Results  Component Value Date   NA 140 02/04/2024   K 3.4 (L) 02/04/2024   CL 110 02/04/2024   CO2 24 02/04/2024   BUN 9 02/04/2024   CREATININE 0.61 02/04/2024   GLUCOSE 86 02/04/2024   GFRNONAA >60 02/04/2024   GFRAA 105 08/26/2019   CALCIUM 7.9 (L) 02/04/2024   PROT 6.6 02/03/2024   ALBUMIN 3.5 02/03/2024   LABGLOB 2.8 04/17/2023   AGRATIO 1.6 04/17/2023   BILITOT 0.3 02/03/2024   ALKPHOS 52 02/03/2024   AST 15 02/03/2024   ALT 10 02/03/2024   ANIONGAP 6 02/04/2024    CBG (last 3)  Recent Labs    02/02/24 1953  GLUCAP 117*      Coagulation Profile: Recent Labs  Lab 02/02/24 2029  INR 1.0     Radiology Studies: I have personally reviewed the imaging studies  VAS US CAROTID Result Date: 02/03/2024 Carotid Arterial Duplex Study Patient Name:  Theresa Watkins  Date of Exam:   02/03/2024 Medical Rec #: [id number]          Accession #:    [id number] Date of Birth: 11/16/1966          Patient  Gender: F Patient Age:   72 years Exam Location:  [facility] Procedure:      VAS US CAROTID Referring Phys: Midge Minium --------------------------------------------------------------------------------  Indications:       CVA, Speech disturbance, Visual disturbance and Weakness. Risk Factors:  Hypertension, hyperlipidemia, current smoker. Limitations        Today's exam was limited due to Labored breathing, pulsatile                    vessels and patient sleeping and snoring. Comparison Study:  Previous study on 2.6.2017. Performing Technologist: Fernande Bras  Examination Guidelines: A complete evaluation includes B-mode imaging, spectral Doppler, color Doppler, and power Doppler as needed of all accessible portions of each vessel. Bilateral testing is considered an integral part of a complete examination. Limited examinations for reoccurring indications may be performed as noted.  Right Carotid Findings: +----------+--------+--------+--------+------------------+--------+           PSV cm/sEDV cm/sStenosisPlaque DescriptionComments +----------+--------+--------+--------+------------------+--------+ CCA Prox  69      14                                         +----------+--------+--------+--------+------------------+--------+ CCA Distal69      18                                         +----------+--------+--------+--------+------------------+--------+ ICA Prox  52      16                                         +----------+--------+--------+--------+------------------+--------+ ICA Mid   74      25                                         +----------+--------+--------+--------+------------------+--------+ ICA Distal122     34                                         +----------+--------+--------+--------+------------------+--------+ ECA       76      11                                          +----------+--------+--------+--------+------------------+--------+ +----------+--------+-------+--------+-------------------+           PSV cm/sEDV cmsDescribeArm Pressure (mmHG) +----------+--------+-------+--------+-------------------+ [id number]     18                                 +----------+--------+-------+--------+-------------------+ +---------+--------+--+--------+--+ VertebralPSV cm/s43EDV cm/s13 +---------+--------+--+--------+--+ Labored breathing, pulsatile vessels and patient sleeping and snoring. Left Carotid Findings: +----------+--------+--------+--------+------------------+--------+           PSV cm/sEDV cm/sStenosisPlaque DescriptionComments +----------+--------+--------+--------+------------------+--------+ CCA Prox  137     24                                         +----------+--------+--------+--------+------------------+--------+ CCA Distal70      18                                         +----------+--------+--------+--------+------------------+--------+  ICA Prox  50      20                                         +----------+--------+--------+--------+------------------+--------+ ICA Mid   111     47                                         +----------+--------+--------+--------+------------------+--------+ ICA Distal94      29                                         +----------+--------+--------+--------+------------------+--------+ ECA       60      11                                         +----------+--------+--------+--------+------------------+--------+ +----------+--------+--------+--------+-------------------+           PSV cm/sEDV cm/sDescribeArm Pressure (mmHG) +----------+--------+--------+--------+-------------------+ [id number]     16                                  +----------+--------+--------+--------+-------------------+ +---------+--------+--+--------+--+ VertebralPSV cm/s55EDV  cm/s13 +---------+--------+--+--------+--+ Labored breathing, pulsatile vessels and patient sleeping and snoring. Elevated velocities in subclavian artery without obvious signs of stenon sis.  Summary: Right Carotid: Velocities in the right ICA are consistent with a 1-39% stenosis. Left Carotid: Velocities in the left ICA are consistent with a 1-39% stenosis.  *See table(s) above for measurements and observations.     Preliminary    ECHOCARDIOGRAM COMPLETE Result Date: 02/03/2024    ECHOCARDIOGRAM REPORT   Patient Name:   Theresa Watkins Date of Exam: 02/03/2024 Medical Rec #:  [id number]         Height:       65.0 in Accession #:    [id number]        Weight:       148.4 lb Date of Birth:  [birth year]         BSA:          1.742 m Patient Age:    57 years          BP:           69/78 mmHg Patient Gender: F                 HR:           71 bpm. Exam Location:  Inpatient Procedure: 2D Echo, Color Doppler and Cardiac Doppler (Both Spectral and Color            Flow Doppler were utilized during procedure). Indications:    Stroke  History:        Patient has prior history of Echocardiogram examinations. Stroke                 and TIA; Risk Factors:Hypertension.  Sonographer:    Lamont Snowball Referring Phys: [phone] ARSHAD N KAKRAKANDY IMPRESSIONS  1. Left ventricular ejection fraction, by estimation, is 60 to 65%. The left ventricle has normal function. The left ventricle has no regional wall  motion abnormalities. Left ventricular diastolic parameters were normal.  2. Right ventricular systolic function is normal. The right ventricular size is normal.  3. The mitral valve is normal in structure. No evidence of mitral valve regurgitation. No evidence of mitral stenosis.  4. The aortic valve is tricuspid. Aortic valve regurgitation is not visualized. No aortic stenosis is present.  5. The inferior vena cava is normal in size with greater than 50% respiratory variability, suggesting right atrial pressure of 3 mmHg.  FINDINGS  Left Ventricle: Left ventricular ejection fraction, by estimation, is 60 to 65%. The left ventricle has normal function. The left ventricle has no regional wall motion abnormalities. The left ventricular internal cavity size was normal in size. There is  no left ventricular hypertrophy. Left ventricular diastolic parameters were normal. Right Ventricle: The right ventricular size is normal. Right ventricular systolic function is normal. Left Atrium: Left atrial size was normal in size. Right Atrium: Right atrial size was normal in size. Pericardium: There is no evidence of pericardial effusion. Mitral Valve: The mitral valve is normal in structure. No evidence of mitral valve regurgitation. No evidence of mitral valve stenosis. MV peak gradient, 3.3 mmHg. The mean mitral valve gradient is 1.0 mmHg. Tricuspid Valve: The tricuspid valve is normal in structure. Tricuspid valve regurgitation is not demonstrated. No evidence of tricuspid stenosis. Aortic Valve: The aortic valve is tricuspid. Aortic valve regurgitation is not visualized. No aortic stenosis is present. Aortic valve peak gradient measures 3.5 mmHg. Pulmonic Valve: The pulmonic valve was not well visualized. Pulmonic valve regurgitation is not visualized. No evidence of pulmonic stenosis. Aorta: The aortic root is normal in size and structure. Venous: The inferior vena cava is normal in size with greater than 50% respiratory variability, suggesting right atrial pressure of 3 mmHg. IAS/Shunts: The interatrial septum is aneurysmal. No atrial level shunt detected by color flow Doppler.  LEFT VENTRICLE PLAX 2D LVIDd:         4.60 cm   Diastology LVIDs:         3.40 cm   LV e' medial:    8.49 cm/s LV PW:         0.90 cm   LV E/e' medial:  9.6 LV IVS:        0.90 cm   LV e' lateral:   11.00 cm/s LVOT diam:     2.20 cm   LV E/e' lateral: 7.4 LV SV:         62 LV SV Index:   35 LVOT Area:     3.80 cm  RIGHT VENTRICLE             IVC RV Basal diam:  3.20  cm     IVC diam: 1.10 cm RV S prime:     10.00 cm/s TAPSE (M-mode): 1.6 cm LEFT ATRIUM             Index        RIGHT ATRIUM           Index LA Vol (A2C):   45.0 ml 25.83 ml/m  RA Area:     10.30 cm LA Vol (A4C):   34.6 ml 19.86 ml/m  RA Volume:   21.80 ml  12.51 ml/m LA Biplane Vol: 37.3 ml 21.41 ml/m  AORTIC VALVE AV Area (Vmax): 2.95 cm AV Vmax:        93.80 cm/s AV Peak Grad:   3.5 mmHg LVOT Vmax:      72.70 cm/s LVOT Vmean:  47.000 cm/s LVOT VTI:       0.162 m  AORTA Ao Root diam: 3.20 cm Ao Asc diam:  3.20 cm MITRAL VALVE MV Area (PHT): 2.99 cm    SHUNTS MV Area VTI:   2.01 cm    Systemic VTI:  0.16 m MV Peak grad:  3.3 mmHg    Systemic Diam: 2.20 cm MV Mean grad:  1.0 mmHg MV Vmax:       0.91 m/s MV Vmean:      55.1 cm/s MV Decel Time: 254 msec MV E velocity: 81.80 cm/s MV A velocity: 76.00 cm/s MV E/A ratio:  1.08 Olga Millers MD Electronically signed by Olga Millers MD Signature Date/Time: 02/03/2024/12:50:13 PM    Final    MR ANGIO HEAD WO CONTRAST Result Date: 02/03/2024 CLINICAL DATA:  58 year old female with left side weakness, difficulty speaking, loss of vision in her left eye. Recent cocaine use. EXAM: MRA HEAD WITHOUT CONTRAST TECHNIQUE: Angiographic images of the Circle of Willis were acquired using MRA technique without intravenous contrast. COMPARISON:  Brain MRI today reported separately. Previous intracranial MRA 11/09/2022. FINDINGS: Anterior circulation: Antegrade flow in both ICA siphons. Chronic tortuosity of the distal cervical right ICA. ICA irregularity in keeping with atherosclerosis but no significant ICA stenosis. Ophthalmic artery origins appear stable and within normal limits. Posterior communicating artery origins remain patent. Infundibulum of the left posterior communicating artery origin again noted (normal variant). Patent carotid termini. Patent MCA and ACA origins. Diminutive or absent anterior communicating artery. Chronic moderate to severe distal A2  segment irregularity and stenosis which is worse on the left side. But no ACA occlusion (series 1040, image 1). This is similar to 2023. Right MCA M1 segment and bifurcation remain patent. Right MCA branches are within normal limits. Left MCA M1 segment and bifurcation remain patent. Mild M1 irregularity. Left MCA branches are within normal limits. Posterior circulation: Antegrade flow in the posterior circulation appears stable since 2023, dominant right vertebral V4 segment. Distal vertebral arteries, left PICA, vertebrobasilar junction are patent. Patent basilar artery, AICA, SCA and PCA origins. There is distal vertebral and basilar artery irregularity suggestive of atherosclerosis, but no hemodynamically significant stenosis. Right greater than left posterior communicating arteries are present. Bilateral PCA branches are patent. But there is new severe left proximal P2 segment irregularity and stenosis since 2023 (series 1052, image 8). And additional mild to moderate distal left P2 stenosis. There is flow signal beyond those lesions. Contralateral right PCA is only mildly irregular in the P2 segment. Anatomic variants: Dominant right vertebral V4 segment. Other: Brain MRI today reported separately. IMPRESSION: 1. Negative for large vessel occlusion. Chronic intracranial irregularity compatible with age advanced atherosclerosis. 2. New since 2023 Severe Left PCA P2 segment stenosis. Chronic Moderate To Severe ACA A2 segment stenoses, worse on the left. Mild intracranial artery irregularity and stenosis otherwise. 3. MRI today reported separately. Electronically Signed   By: Odessa Fleming M.D.   On: 02/03/2024 04:28   MR BRAIN WO CONTRAST Result Date: 02/03/2024 CLINICAL DATA:  58 year old female with left side weakness, difficulty speaking, loss of vision in her left eye. Recent cocaine use. EXAM: MRI HEAD WITHOUT CONTRAST TECHNIQUE: Multiplanar, multiecho pulse sequences of the brain and surrounding structures  were obtained without intravenous contrast. COMPARISON:  Intracranial MRA today reported separately. Head CT yesterday. Previous brain MRI 11/09/2022. FINDINGS: Brain: Positive for several small foci of abnormal diffusion in the lateral right thalamus near the posterior limb external capsule which appears restricted on ADC (  series 5, images 73 and 75). Underlying numerous chronic lacunar infarcts in the bilateral deep gray nuclei, left corona radiata, anterior body of the left corpus callosum. Faint T2 and FLAIR hyperintensity associated with the restricted diffusion. Several chronic microhemorrhages elsewhere in the deep gray nuclei but no acute intracranial hemorrhage. No other diffusion restriction. Chronic lacunar infarcts also in the central pons. No cortical encephalomalacia identified. No midline shift, mass effect, evidence of mass lesion, ventriculomegaly, extra-axial collection or acute intracranial hemorrhage. Cervicomedullary junction and pituitary are within normal limits. 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 But the deep gray nuclei chronic hemosiderin is stable. Vascular: Major intracranial vascular flow voids are stable since 2023. Skull and upper cervical spine: Visualized bone marrow signal is within normal limits. Chronic cervical spine disc and endplate degeneration again noted. Sinuses/Orbits: Stable, negative. Other: Negative visible scalp and face. IMPRESSION: 1. Positive for several small Acute to Subacute Lacunar Infarcts in the lateral Right thalamus, posterior limb right external capsule. No associated hemorrhage or mass effect. 2. Underlying extensive chronic small vessel disease. 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 3. MRA reported separately. Electronically Signed   By: Odessa Fleming M.D.   On: 02/03/2024 04:20   CT  HEAD WO CONTRAST Result Date: 02/02/2024 CLINICAL DATA:  Neuro deficit, acute, stroke suspected EXAM: CT HEAD WITHOUT CONTRAST TECHNIQUE: Contiguous axial images were obtained from the base of the skull through the vertex without intravenous contrast. RADIATION DOSE REDUCTION: This exam was performed according to the departmental dose-optimization program which includes automated exposure control, adjustment of the mA and/or kV according to patient size and/or use of iterative reconstruction technique. COMPARISON:  11/09/2022 FINDINGS: Brain: Old lacunar infarcts in the left periventricular white matter and bilateral basal ganglia. Patchy low-density in the periventricular white matter bilaterally is stable. No acute intracranial abnormality. Specifically, no hemorrhage, hydrocephalus, mass lesion, acute infarction, or significant intracranial injury. Vascular: No hyperdense vessel or unexpected calcification. Skull: No acute calvarial abnormality. Sinuses/Orbits: No acute findings Other: None IMPRESSION: Stable patchy chronic small vessel disease in the periventricular white matter. Bilateral lacunar infarcts as above. No acute intracranial abnormality. Electronically Signed   By: Charlett Nose M.D.   On: 02/02/2024 22:40       Liese Dizdarevic M.D. [facility] 02/04/2024, 1:07 PM  Available via Epic secure chat 7am-7pm After 7 pm, please refer to night coverage provider listed on amion.

## 2024-02-04 NOTE — Consult Note (Signed)
 Physical Medicine and Rehabilitation Consult Reason for Consult: CVA Referring Physician: Thad Ranger, MD   HPI: Theresa Watkins is a 58 y.o. female with a PMH of CVA, HTN, HLD, hypothyroidism, cocaine abuse. She presented to the hospital on 02/03/24 after being found down by her son. She was brought to the ER and had slurred speech and left sided weakness. MRI showed acute to subacute lacunar infarcts in the lateral right thalamus, posterior limb right external capsule. There was no hemorrhage or mass effect. UDS was positive for cocaine.    ROS +dysarthria Past Medical History:  Diagnosis Date   Abnormal Pap smear    Bronchitis    Fibroid    Headache(784.0)    Ovarian cyst    Plantar fasciitis    Seizures (HCC)    Stroke [facility])    Thyroid cancer (HCC) 20011   Trichomonas    Urinary tract infection    Past Surgical History:  Procedure Laterality Date   ABDOMINAL HYSTERECTOMY     CESAREAN SECTION     goiter     removed   laparoscopic  1988   removal  of ectopic preg, ruptured tube   THERAPEUTIC ABORTION     THYROIDECTOMY  march 2011   cancer   Family History  Problem Relation Age of Onset   Bell's palsy Mother    Hypertension Mother    Diabetes Mother    Stroke Father    Prostate cancer Father    Breast cancer Cousin    Hypertension Sister    Colon cancer Maternal Aunt    Colon cancer Paternal Aunt    Diverticulitis Sister    Diabetes Sister    Social History:  reports that she has been smoking cigarettes. She has a 2.5 pack-year smoking history. She has never used smokeless tobacco. She reports current alcohol use. She reports current drug use. Drug: Cocaine. Allergies:  Allergies  Allergen Reactions   Gadolinium Derivatives Hives, Itching and Swelling    After MultiHance (gadolinium) injection, pt began sneezing.  After exam, pt told MR tech Donnamae Jude that she was itching.  Dr. Manson Passey evaluated pt, and noticed hives on pt's back.  Pt said that she felt  swelling in her throat.  Dr. Manson Passey directed that pt be taken to hospital via ambulance.     Iohexol Hives, Itching and Other (See Comments)     Code: HIVES, Desc: pts tongue began itching post injection and throat burning, Onset Date: [id number]    Proanthocyanidin Swelling    Swelling of the tongue   Grapeseed Extract [Nutritional Supplements]     Tongue swelll   Diphenhydramine Hcl Rash   Tramadol Rash   Medications Prior to Admission  Medication Sig Dispense Refill   clopidogrel (PLAVIX) 75 MG tablet Take 1 tablet (75 mg total) by mouth daily. 30 tablet 0   levothyroxine (SYNTHROID) 100 MCG tablet Take 1 tablet (100 mcg total) by mouth daily before breakfast. 30 tablet 0   rosuvastatin (CRESTOR) 40 MG tablet Take 1 tablet (40 mg total) by mouth daily. 90 tablet 1    Home: Home Living Family/patient expects to be discharged to:: Private residence Living Arrangements: Alone Available Help at Discharge: Family, Available PRN/intermittently Type of Home: Apartment Home Access: Stairs to enter [employer] of Steps: 3 Entrance Stairs-Rails: None Home Layout: One level Bathroom Shower/Tub: Engineer, manufacturing systems: Standard Bathroom Accessibility: Yes Home Equipment: Gilmer Mor - single point  Lives With: Alone  Functional History: Prior  Function Prior Level of Function : Patient poor historian/Family not available (Information from chart review) Mobility Comments: pt reports ambulating with use of SPC. Pt is often difficult to understand throughout session due to dysarthria and expressive aphasia. Functional Status:  Mobility: Bed Mobility Overal bed mobility: Needs Assistance Bed Mobility: Supine to Sit Supine to sit: HOB elevated, Mod assist Transfers Overall transfer level: Needs assistance Equipment used: 1 person hand held assist Transfers: Sit to/from Stand, Bed to chair/wheelchair/BSC Sit to Stand: Min assist Bed to/from chair/wheelchair/BSC transfer  type:: Stand pivot Stand pivot transfers: Mod assist      ADL: ADL Overall ADL's : Needs assistance/impaired Eating/Feeding: Minimal assistance Grooming: Moderate assistance, Sitting Upper Body Bathing: Moderate assistance, Sitting Lower Body Bathing: Maximal assistance, Bed level Upper Body Dressing : Maximal assistance, Sitting Lower Body Dressing: Maximal assistance, Bed level Toilet Transfer: Moderate assistance, Squat-pivot Toileting- Clothing Manipulation and Hygiene: Maximal assistance Functional mobility during ADLs: Moderate assistance  Cognition: Cognition Overall Cognitive Status: No family/caregiver present to determine baseline cognitive functioning Arousal/Alertness: Awake/alert Orientation Level: Oriented X4 Year: 2025 Month: March Day of Week: Correct Attention: Sustained Sustained Attention: Appears intact Memory: Impaired Memory Impairment: Decreased recall of new information Awareness: Impaired Awareness Impairment: Emergent impairment Cognition Arousal: Alert Behavior During Therapy: WFL for tasks assessed/performed Overall Cognitive Status: No family/caregiver present to determine baseline cognitive functioning  Blood pressure (!) 152/95, pulse 76, temperature 99.2 F (37.3 C), temperature source Oral, resp. rate 17, height 5\' 5"  (1.651 m), weight 67.3 kg, SpO2 96%. Physical Exam Gen: no distress, normal appearing HEENT: oral mucosa pink and moist, NCAT Cardio: Reg rate Chest: normal effort, normal rate of breathing Abd: soft, non-distended Ext: no edema Psych: pleasant, normal affect Skin: intact Neuro: Alert and oriented x4, dysarthric, cognition intact, 3/5 proximally, 4/5 distally in both upper and lower extremities  Results for orders placed or performed during the hospital encounter of 02/02/24 (from the past 24 hours)  CBC     Status: Abnormal   Collection Time: 02/04/24  5:15 AM  Result Value Ref Range   WBC 5.6 4.0 - 10.5 K/uL   RBC  5.14 (H) 3.87 - 5.11 MIL/uL   Hemoglobin 14.6 12.0 - 15.0 g/dL   HCT 13.0 86.5 - 78.4 %   MCV 86.2 80.0 - 100.0 fL   MCH 28.4 26.0 - 34.0 pg   MCHC 33.0 30.0 - 36.0 g/dL   RDW 69.6 29.5 - 28.4 %   Platelets 170 150 - 400 K/uL   nRBC 0.0 0.0 - 0.2 %  Basic metabolic panel     Status: Abnormal   Collection Time: 02/04/24  5:15 AM  Result Value Ref Range   Sodium 140 135 - 145 mmol/L   Potassium 3.4 (L) 3.5 - 5.1 mmol/L   Chloride 110 98 - 111 mmol/L   CO2 24 22 - 32 mmol/L   Glucose, Bld 86 70 - 99 mg/dL   BUN 9 6 - 20 mg/dL   Creatinine, Ser 1.32 0.44 - 1.00 mg/dL   Calcium 7.9 (L) 8.9 - 10.3 mg/dL   GFR, Estimated >44 >01 mL/min   Anion gap 6 5 - 15   VAS US CAROTID Result Date: 02/03/2024 Carotid Arterial Duplex Study Patient Name:  Theresa Watkins  Date of Exam:   02/03/2024 Medical Rec #: [id number]          Accession #:    [id number] Date of Birth: [birth year]          Patient  Gender: F Patient Age:   30 years Exam Location:  [facility] Procedure:      VAS US CAROTID Referring Phys: Midge Minium --------------------------------------------------------------------------------  Indications:       CVA, Speech disturbance, Visual disturbance and Weakness. Risk Factors:      Hypertension, hyperlipidemia, current smoker. Limitations        Today's exam was limited due to Labored breathing, pulsatile                    vessels and patient sleeping and snoring. Comparison Study:  Previous study on 2.6.2017. Performing Technologist: Fernande Bras  Examination Guidelines: A complete evaluation includes B-mode imaging, spectral Doppler, color Doppler, and power Doppler as needed of all accessible portions of each vessel. Bilateral testing is considered an integral part of a complete examination. Limited examinations for reoccurring indications may be performed as noted.  Right Carotid Findings: +----------+--------+--------+--------+------------------+--------+           PSV  cm/sEDV cm/sStenosisPlaque DescriptionComments +----------+--------+--------+--------+------------------+--------+ CCA Prox  69      14                                         +----------+--------+--------+--------+------------------+--------+ CCA Distal69      18                                         +----------+--------+--------+--------+------------------+--------+ ICA Prox  52      16                                         +----------+--------+--------+--------+------------------+--------+ ICA Mid   74      25                                         +----------+--------+--------+--------+------------------+--------+ ICA Distal122     34                                         +----------+--------+--------+--------+------------------+--------+ ECA       76      11                                         +----------+--------+--------+--------+------------------+--------+ +----------+--------+-------+--------+-------------------+           PSV cm/sEDV cmsDescribeArm Pressure (mmHG) +----------+--------+-------+--------+-------------------+ [id number]     18                                 +----------+--------+-------+--------+-------------------+ +---------+--------+--+--------+--+ VertebralPSV cm/s43EDV cm/s13 +---------+--------+--+--------+--+ Labored breathing, pulsatile vessels and patient sleeping and snoring. Left Carotid Findings: +----------+--------+--------+--------+------------------+--------+           PSV cm/sEDV cm/sStenosisPlaque DescriptionComments +----------+--------+--------+--------+------------------+--------+ CCA Prox  137     24                                         +----------+--------+--------+--------+------------------+--------+  CCA Distal70      18                                         +----------+--------+--------+--------+------------------+--------+ ICA Prox  50      20                                          +----------+--------+--------+--------+------------------+--------+ ICA Mid   111     47                                         +----------+--------+--------+--------+------------------+--------+ ICA Distal94      29                                         +----------+--------+--------+--------+------------------+--------+ ECA       60      11                                         +----------+--------+--------+--------+------------------+--------+ +----------+--------+--------+--------+-------------------+           PSV cm/sEDV cm/sDescribeArm Pressure (mmHG) +----------+--------+--------+--------+-------------------+ [id number]     16                                  +----------+--------+--------+--------+-------------------+ +---------+--------+--+--------+--+ VertebralPSV cm/s55EDV cm/s13 +---------+--------+--+--------+--+ Labored breathing, pulsatile vessels and patient sleeping and snoring. Elevated velocities in subclavian artery without obvious signs of stenon sis.  Summary: Right Carotid: Velocities in the right ICA are consistent with a 1-39% stenosis. Left Carotid: Velocities in the left ICA are consistent with a 1-39% stenosis.  *See table(s) above for measurements and observations.     Preliminary    ECHOCARDIOGRAM COMPLETE Result Date: 02/03/2024    ECHOCARDIOGRAM REPORT   Patient Name:   Theresa Watkins Date of Exam: 02/03/2024 Medical Rec #:  [id number]         Height:       65.0 in Accession #:    [id number]        Weight:       148.4 lb Date of Birth:  10-Mar-1966         BSA:          1.742 m Patient Age:    57 years          BP:           69/78 mmHg Patient Gender: F                 HR:           71 bpm. Exam Location:  Inpatient Procedure: 2D Echo, Color Doppler and Cardiac Doppler (Both Spectral and Color            Flow Doppler were utilized during procedure). Indications:    Stroke  History:        Patient has prior  history of Echocardiogram examinations. Stroke  and TIA; Risk Factors:Hypertension.  Sonographer:    Lamont Snowball Referring Phys: [phone] ARSHAD N KAKRAKANDY IMPRESSIONS  1. Left ventricular ejection fraction, by estimation, is 60 to 65%. The left ventricle has normal function. The left ventricle has no regional wall motion abnormalities. Left ventricular diastolic parameters were normal.  2. Right ventricular systolic function is normal. The right ventricular size is normal.  3. The mitral valve is normal in structure. No evidence of mitral valve regurgitation. No evidence of mitral stenosis.  4. The aortic valve is tricuspid. Aortic valve regurgitation is not visualized. No aortic stenosis is present.  5. The inferior vena cava is normal in size with greater than 50% respiratory variability, suggesting right atrial pressure of 3 mmHg. FINDINGS  Left Ventricle: Left ventricular ejection fraction, by estimation, is 60 to 65%. The left ventricle has normal function. The left ventricle has no regional wall motion abnormalities. The left ventricular internal cavity size was normal in size. There is  no left ventricular hypertrophy. Left ventricular diastolic parameters were normal. Right Ventricle: The right ventricular size is normal. Right ventricular systolic function is normal. Left Atrium: Left atrial size was normal in size. Right Atrium: Right atrial size was normal in size. Pericardium: There is no evidence of pericardial effusion. Mitral Valve: The mitral valve is normal in structure. No evidence of mitral valve regurgitation. No evidence of mitral valve stenosis. MV peak gradient, 3.3 mmHg. The mean mitral valve gradient is 1.0 mmHg. Tricuspid Valve: The tricuspid valve is normal in structure. Tricuspid valve regurgitation is not demonstrated. No evidence of tricuspid stenosis. Aortic Valve: The aortic valve is tricuspid. Aortic valve regurgitation is not visualized. No aortic stenosis is  present. Aortic valve peak gradient measures 3.5 mmHg. Pulmonic Valve: The pulmonic valve was not well visualized. Pulmonic valve regurgitation is not visualized. No evidence of pulmonic stenosis. Aorta: The aortic root is normal in size and structure. Venous: The inferior vena cava is normal in size with greater than 50% respiratory variability, suggesting right atrial pressure of 3 mmHg. IAS/Shunts: The interatrial septum is aneurysmal. No atrial level shunt detected by color flow Doppler.  LEFT VENTRICLE PLAX 2D LVIDd:         4.60 cm   Diastology LVIDs:         3.40 cm   LV e' medial:    8.49 cm/s LV PW:         0.90 cm   LV E/e' medial:  9.6 LV IVS:        0.90 cm   LV e' lateral:   11.00 cm/s LVOT diam:     2.20 cm   LV E/e' lateral: 7.4 LV SV:         62 LV SV Index:   35 LVOT Area:     3.80 cm  RIGHT VENTRICLE             IVC RV Basal diam:  3.20 cm     IVC diam: 1.10 cm RV S prime:     10.00 cm/s TAPSE (M-mode): 1.6 cm LEFT ATRIUM             Index        RIGHT ATRIUM           Index LA Vol (A2C):   45.0 ml 25.83 ml/m  RA Area:     10.30 cm LA Vol (A4C):   34.6 ml 19.86 ml/m  RA Volume:   21.80 ml  12.51 ml/m LA Biplane  Vol: 37.3 ml 21.41 ml/m  AORTIC VALVE AV Area (Vmax): 2.95 cm AV Vmax:        93.80 cm/s AV Peak Grad:   3.5 mmHg LVOT Vmax:      72.70 cm/s LVOT Vmean:     47.000 cm/s LVOT VTI:       0.162 m  AORTA Ao Root diam: 3.20 cm Ao Asc diam:  3.20 cm MITRAL VALVE MV Area (PHT): 2.99 cm    SHUNTS MV Area VTI:   2.01 cm    Systemic VTI:  0.16 m MV Peak grad:  3.3 mmHg    Systemic Diam: 2.20 cm MV Mean grad:  1.0 mmHg MV Vmax:       0.91 m/s MV Vmean:      55.1 cm/s MV Decel Time: 254 msec MV E velocity: 81.80 cm/s MV A velocity: 76.00 cm/s MV E/A ratio:  1.08 Olga Millers MD Electronically signed by Olga Millers MD Signature Date/Time: 02/03/2024/12:50:13 PM    Final    MR ANGIO HEAD WO CONTRAST Result Date: 02/03/2024 CLINICAL DATA:  58 year old female with left side weakness,  difficulty speaking, loss of vision in her left eye. Recent cocaine use. EXAM: MRA HEAD WITHOUT CONTRAST TECHNIQUE: Angiographic images of the Circle of Willis were acquired using MRA technique without intravenous contrast. COMPARISON:  Brain MRI today reported separately. Previous intracranial MRA 11/09/2022. FINDINGS: Anterior circulation: Antegrade flow in both ICA siphons. Chronic tortuosity of the distal cervical right ICA. ICA irregularity in keeping with atherosclerosis but no significant ICA stenosis. Ophthalmic artery origins appear stable and within normal limits. Posterior communicating artery origins remain patent. Infundibulum of the left posterior communicating artery origin again noted (normal variant). Patent carotid termini. Patent MCA and ACA origins. Diminutive or absent anterior communicating artery. Chronic moderate to severe distal A2 segment irregularity and stenosis which is worse on the left side. But no ACA occlusion (series 1040, image 1). This is similar to 2023. Right MCA M1 segment and bifurcation remain patent. Right MCA branches are within normal limits. Left MCA M1 segment and bifurcation remain patent. Mild M1 irregularity. Left MCA branches are within normal limits. Posterior circulation: Antegrade flow in the posterior circulation appears stable since 2023, dominant right vertebral V4 segment. Distal vertebral arteries, left PICA, vertebrobasilar junction are patent. Patent basilar artery, AICA, SCA and PCA origins. There is distal vertebral and basilar artery irregularity suggestive of atherosclerosis, but no hemodynamically significant stenosis. Right greater than left posterior communicating arteries are present. Bilateral PCA branches are patent. But there is new severe left proximal P2 segment irregularity and stenosis since 2023 (series 1052, image 8). And additional mild to moderate distal left P2 stenosis. There is flow signal beyond those lesions. Contralateral right PCA  is only mildly irregular in the P2 segment. Anatomic variants: Dominant right vertebral V4 segment. Other: Brain MRI today reported separately. IMPRESSION: 1. Negative for large vessel occlusion. Chronic intracranial irregularity compatible with age advanced atherosclerosis. 2. New since 2023 Severe Left PCA P2 segment stenosis. Chronic Moderate To Severe ACA A2 segment stenoses, worse on the left. Mild intracranial artery irregularity and stenosis otherwise. 3. MRI today reported separately. Electronically Signed   By: Odessa Fleming M.D.   On: 02/03/2024 04:28   MR BRAIN WO CONTRAST Result Date: 02/03/2024 CLINICAL DATA:  58 year old female with left side weakness, difficulty speaking, loss of vision in her left eye. Recent cocaine use. EXAM: MRI HEAD WITHOUT CONTRAST TECHNIQUE: Multiplanar, multiecho pulse sequences of the brain and surrounding  structures were obtained without intravenous contrast. COMPARISON:  Intracranial MRA today reported separately. Head CT yesterday. Previous brain MRI 11/09/2022. FINDINGS: Brain: Positive for several small foci of abnormal diffusion in the lateral right thalamus near the posterior limb external capsule which appears restricted on ADC (series 5, images 73 and 75). Underlying numerous chronic lacunar infarcts in the bilateral deep gray nuclei, left corona radiata, anterior body of the left corpus callosum. Faint T2 and FLAIR hyperintensity associated with the restricted diffusion. Several chronic microhemorrhages elsewhere in the deep gray nuclei but no acute intracranial hemorrhage. No other diffusion restriction. Chronic lacunar infarcts also in the central pons. No cortical encephalomalacia identified. No midline shift, mass effect, evidence of mass lesion, ventriculomegaly, extra-axial collection or acute intracranial hemorrhage. Cervicomedullary junction and pituitary are within normal limits. 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 But the deep gray nuclei chronic hemosiderin is stable. Vascular: Major intracranial vascular flow voids are stable since 2023. Skull and upper cervical spine: Visualized bone marrow signal is within normal limits. Chronic cervical spine disc and endplate degeneration again noted. Sinuses/Orbits: Stable, negative. Other: Negative visible scalp and face. IMPRESSION: 1. Positive for several small Acute to Subacute Lacunar Infarcts in the lateral Right thalamus, posterior limb right external capsule. No associated hemorrhage or mass effect. 2. Underlying extensive chronic small vessel disease. 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 3. MRA reported separately. Electronically Signed   By: Odessa Fleming M.D.   On: 02/03/2024 04:20   CT HEAD WO CONTRAST Result Date: 02/02/2024 CLINICAL DATA:  Neuro deficit, acute, stroke suspected EXAM: CT HEAD WITHOUT CONTRAST TECHNIQUE: Contiguous axial images were obtained from the base of the skull through the vertex without intravenous contrast. RADIATION DOSE REDUCTION: This exam was performed according to the departmental dose-optimization program which includes automated exposure control, adjustment of the mA and/or kV according to patient size and/or use of iterative reconstruction technique. COMPARISON:  11/09/2022 FINDINGS: Brain: Old lacunar infarcts in the left periventricular white matter and bilateral basal ganglia. Patchy low-density in the periventricular white matter bilaterally is stable. No acute intracranial abnormality. Specifically, no hemorrhage, hydrocephalus, mass lesion, acute infarction, or significant intracranial injury. Vascular: No hyperdense vessel or unexpected calcification. Skull: No acute calvarial abnormality. Sinuses/Orbits: No acute findings Other: None IMPRESSION: Stable patchy chronic small vessel disease in the  periventricular white matter. Bilateral lacunar infarcts as above. No acute intracranial abnormality. Electronically Signed   By: Charlett Nose M.D.   On: 02/02/2024 22:40    Assessment/Plan: Diagnosis:  Acute CVA Does the need for close, 24 hr/day medical supervision in concert with the patient's rehab needs make it unreasonable for this patient to be served in a less intensive setting? Yes Co-Morbidities requiring supervision/potential complications:  1) HLD: continue crestor 2) Tobacco abuse: provide counseling 3) Prediabetes: provide dietary education 4) HTN: continue to monitor TID 5) Dysarthria: continue SLP Due to bladder management, bowel management, safety, skin/wound care, disease management, medication administration, pain management, and patient education, does the patient require 24 hr/day rehab nursing? Yes Does the patient require coordinated care of a physician, rehab nurse, therapy disciplines of PT, OT SLP to address physical and functional deficits in the context of the above medical diagnosis(es)? Yes Addressing deficits in the following areas: balance, endurance, locomotion, strength, transferring, bowel/bladder control, bathing, dressing, feeding, grooming, toileting, speech,  and psychosocial support Can the patient actively participate in an intensive therapy program of at least 3 hrs of therapy per day at least 5 days per week? Yes The potential for patient to make measurable gains while on inpatient rehab is excellent Anticipated functional outcomes upon discharge from inpatient rehab are modified independent  with PT, modified independent with OT, modified independent with SLP. Estimated rehab length of stay to reach the above functional goals is: 12-16 days Anticipated discharge destination: Home Overall Rehab/Functional Prognosis: excellent  POST ACUTE RECOMMENDATIONS: This patient's condition is appropriate for continued rehabilitative care in the following setting:  CIR Patient has agreed to participate in recommended program. Yes Note that insurance prior authorization may be required for reimbursement for recommended care.   I have personally performed a face to face diagnostic evaluation of this patient. Additionally, I have examined the patient's medical record including any pertinent labs and radiographic images.    Thanks,  Horton Chin, MD 02/04/2024

## 2024-02-04 NOTE — Progress Notes (Signed)
 STROKE TEAM PROGRESS NOTE   SUBJECTIVE (INTERVAL HISTORY) No family is at the bedside. Pt lying in bed, initially sleeping but easily arousable.   OBJECTIVE Temp:  [97.8 F (36.6 C)-99.2 F (37.3 C)] 98.5 F (36.9 C) (03/19 1534) Pulse Rate:  [64-78] 64 (03/19 1534) Cardiac Rhythm: Normal sinus rhythm (03/19 0831) Resp:  [17-19] 18 (03/19 1534) BP: (130-158)/(74-95) 130/74 (03/19 1534) SpO2:  [90 %-96 %] 91 % (03/19 1534)  Recent Labs  Lab 02/02/24 1953  GLUCAP 117*   Recent Labs  Lab 02/02/24 2029 02/02/24 2034 02/03/24 0552 02/04/24 0515  NA 143 143 142 140  K 3.4* 3.4* 3.1* 3.4*  CL 106 107 109 110  CO2 27  --  23 24  GLUCOSE 97 92 99 86  BUN 7 8 9 9   CREATININE 0.68 0.70 0.64 0.61  CALCIUM 8.8*  --  8.3* 7.9*   Recent Labs  Lab 02/02/24 2029 02/03/24 0552  AST 15 15  ALT 10 10  ALKPHOS 52 52  BILITOT 0.6 0.3  PROT 7.2 6.6  ALBUMIN 4.0 3.5   Recent Labs  Lab 02/02/24 2029 02/02/24 2034 02/03/24 0552 02/04/24 0515  WBC 6.6  --  7.9 5.6  NEUTROABS 4.8  --   --   --   HGB 15.8* 17.0* 14.9 14.6  HCT 49.8* 50.0* 46.5* 44.3  MCV 89.1  --  88.4 86.2  PLT 204  --  200 170   No results for input(s): "CKTOTAL", "CKMB", "CKMBINDEX", "TROPONINI" in the last 168 hours. Recent Labs    02/02/24 2029  LABPROT 13.8  INR 1.0   Recent Labs    02/03/24 0152  COLORURINE YELLOW  LABSPEC 1.028  PHURINE 8.0  GLUCOSEU NEGATIVE  HGBUR NEGATIVE  BILIRUBINUR NEGATIVE  KETONESUR NEGATIVE  PROTEINUR 30*  NITRITE NEGATIVE  LEUKOCYTESUR NEGATIVE       Component Value Date/Time   CHOL 164 02/03/2024 0552   CHOL 157 04/17/2023 1658   TRIG 105 02/03/2024 0552   HDL 53 02/03/2024 0552   HDL 60 04/17/2023 1658   CHOLHDL 3.1 02/03/2024 0552   VLDL 21 02/03/2024 0552   LDLCALC 90 02/03/2024 0552   LDLCALC 84 04/17/2023 1658   Lab Results  Component Value Date   HGBA1C 5.8 (H) 02/03/2024      Component Value Date/Time   LABOPIA NONE DETECTED 02/03/2024  0152   COCAINSCRNUR POSITIVE (A) 02/03/2024 0152   LABBENZ NONE DETECTED 02/03/2024 0152   AMPHETMU NONE DETECTED 02/03/2024 0152   THCU NONE DETECTED 02/03/2024 0152   LABBARB NONE DETECTED 02/03/2024 0152    Recent Labs  Lab 02/02/24 2029  ETH <10    I have personally reviewed the radiological images below and agree with the radiology interpretations.  VAS US CAROTID Result Date: 02/03/2024 Carotid Arterial Duplex Study Patient Name:  SITA MANGEN  Date of Exam:   02/03/2024 Medical Rec #: [id number]          Accession #:    [id number] Date of Birth: 1966-09-20          Patient Gender: F Patient Age:   58 years Exam Location:  [facility] Procedure:      VAS US CAROTID Referring Phys: Midge Minium --------------------------------------------------------------------------------  Indications:       CVA, Speech disturbance, Visual disturbance and Weakness. Risk Factors:      Hypertension, hyperlipidemia, current smoker. Limitations        Today's exam was limited due to Labored breathing, pulsatile  vessels and patient sleeping and snoring. Comparison Study:  Previous study on 2.6.2017. Performing Technologist: Fernande Bras  Examination Guidelines: A complete evaluation includes B-mode imaging, spectral Doppler, color Doppler, and power Doppler as needed of all accessible portions of each vessel. Bilateral testing is considered an integral part of a complete examination. Limited examinations for reoccurring indications may be performed as noted.  Right Carotid Findings: +----------+--------+--------+--------+------------------+--------+           PSV cm/sEDV cm/sStenosisPlaque DescriptionComments +----------+--------+--------+--------+------------------+--------+ CCA Prox  69      14                                         +----------+--------+--------+--------+------------------+--------+ CCA Distal69      18                                          +----------+--------+--------+--------+------------------+--------+ ICA Prox  52      16                                         +----------+--------+--------+--------+------------------+--------+ ICA Mid   74      25                                         +----------+--------+--------+--------+------------------+--------+ ICA Distal122     34                                         +----------+--------+--------+--------+------------------+--------+ ECA       76      11                                         +----------+--------+--------+--------+------------------+--------+ +----------+--------+-------+--------+-------------------+           PSV cm/sEDV cmsDescribeArm Pressure (mmHG) +----------+--------+-------+--------+-------------------+ [id number]     18                                 +----------+--------+-------+--------+-------------------+ +---------+--------+--+--------+--+ VertebralPSV cm/s43EDV cm/s13 +---------+--------+--+--------+--+ Labored breathing, pulsatile vessels and patient sleeping and snoring. Left Carotid Findings: +----------+--------+--------+--------+------------------+--------+           PSV cm/sEDV cm/sStenosisPlaque DescriptionComments +----------+--------+--------+--------+------------------+--------+ CCA Prox  137     24                                         +----------+--------+--------+--------+------------------+--------+ CCA Distal70      18                                         +----------+--------+--------+--------+------------------+--------+ ICA Prox  50      20                                         +----------+--------+--------+--------+------------------+--------+  ICA Mid   111     47                                         +----------+--------+--------+--------+------------------+--------+ ICA Distal94      29                                          +----------+--------+--------+--------+------------------+--------+ ECA       60      11                                         +----------+--------+--------+--------+------------------+--------+ +----------+--------+--------+--------+-------------------+           PSV cm/sEDV cm/sDescribeArm Pressure (mmHG) +----------+--------+--------+--------+-------------------+ [id number]     16                                  +----------+--------+--------+--------+-------------------+ +---------+--------+--+--------+--+ VertebralPSV cm/s55EDV cm/s13 +---------+--------+--+--------+--+ Labored breathing, pulsatile vessels and patient sleeping and snoring. Elevated velocities in subclavian artery without obvious signs of stenon sis.  Summary: Right Carotid: Velocities in the right ICA are consistent with a 1-39% stenosis. Left Carotid: Velocities in the left ICA are consistent with a 1-39% stenosis.  *See table(s) above for measurements and observations.     Preliminary    ECHOCARDIOGRAM COMPLETE Result Date: 02/03/2024    ECHOCARDIOGRAM REPORT   Patient Name:   DAISY LITES Date of Exam: 02/03/2024 Medical Rec #:  [id number]         Height:       65.0 in Accession #:    [id number]        Weight:       148.4 lb Date of Birth:  1966/09/14         BSA:          1.742 m Patient Age:    57 years          BP:           69/78 mmHg Patient Gender: F                 HR:           71 bpm. Exam Location:  Inpatient Procedure: 2D Echo, Color Doppler and Cardiac Doppler (Both Spectral and Color            Flow Doppler were utilized during procedure). Indications:    Stroke  History:        Patient has prior history of Echocardiogram examinations. Stroke                 and TIA; Risk Factors:Hypertension.  Sonographer:    Lamont Snowball Referring Phys: [phone] ARSHAD N KAKRAKANDY IMPRESSIONS  1. Left ventricular ejection fraction, by estimation, is 60 to 65%. The left ventricle has normal function. The left  ventricle has no regional wall motion abnormalities. Left ventricular diastolic parameters were normal.  2. Right ventricular systolic function is normal. The right ventricular size is normal.  3. The mitral valve is normal in structure. No evidence of mitral valve regurgitation. No evidence of mitral stenosis.  4. The aortic valve is tricuspid. Aortic  valve regurgitation is not visualized. No aortic stenosis is present.  5. The inferior vena cava is normal in size with greater than 50% respiratory variability, suggesting right atrial pressure of 3 mmHg. FINDINGS  Left Ventricle: Left ventricular ejection fraction, by estimation, is 60 to 65%. The left ventricle has normal function. The left ventricle has no regional wall motion abnormalities. The left ventricular internal cavity size was normal in size. There is  no left ventricular hypertrophy. Left ventricular diastolic parameters were normal. Right Ventricle: The right ventricular size is normal. Right ventricular systolic function is normal. Left Atrium: Left atrial size was normal in size. Right Atrium: Right atrial size was normal in size. Pericardium: There is no evidence of pericardial effusion. Mitral Valve: The mitral valve is normal in structure. No evidence of mitral valve regurgitation. No evidence of mitral valve stenosis. MV peak gradient, 3.3 mmHg. The mean mitral valve gradient is 1.0 mmHg. Tricuspid Valve: The tricuspid valve is normal in structure. Tricuspid valve regurgitation is not demonstrated. No evidence of tricuspid stenosis. Aortic Valve: The aortic valve is tricuspid. Aortic valve regurgitation is not visualized. No aortic stenosis is present. Aortic valve peak gradient measures 3.5 mmHg. Pulmonic Valve: The pulmonic valve was not well visualized. Pulmonic valve regurgitation is not visualized. No evidence of pulmonic stenosis. Aorta: The aortic root is normal in size and structure. Venous: The inferior vena cava is normal in size with  greater than 50% respiratory variability, suggesting right atrial pressure of 3 mmHg. IAS/Shunts: The interatrial septum is aneurysmal. No atrial level shunt detected by color flow Doppler.  LEFT VENTRICLE PLAX 2D LVIDd:         4.60 cm   Diastology LVIDs:         3.40 cm   LV e' medial:    8.49 cm/s LV PW:         0.90 cm   LV E/e' medial:  9.6 LV IVS:        0.90 cm   LV e' lateral:   11.00 cm/s LVOT diam:     2.20 cm   LV E/e' lateral: 7.4 LV SV:         62 LV SV Index:   35 LVOT Area:     3.80 cm  RIGHT VENTRICLE             IVC RV Basal diam:  3.20 cm     IVC diam: 1.10 cm RV S prime:     10.00 cm/s TAPSE (M-mode): 1.6 cm LEFT ATRIUM             Index        RIGHT ATRIUM           Index LA Vol (A2C):   45.0 ml 25.83 ml/m  RA Area:     10.30 cm LA Vol (A4C):   34.6 ml 19.86 ml/m  RA Volume:   21.80 ml  12.51 ml/m LA Biplane Vol: 37.3 ml 21.41 ml/m  AORTIC VALVE AV Area (Vmax): 2.95 cm AV Vmax:        93.80 cm/s AV Peak Grad:   3.5 mmHg LVOT Vmax:      72.70 cm/s LVOT Vmean:     47.000 cm/s LVOT VTI:       0.162 m  AORTA Ao Root diam: 3.20 cm Ao Asc diam:  3.20 cm MITRAL VALVE MV Area (PHT): 2.99 cm    SHUNTS MV Area VTI:   2.01 cm    Systemic  VTI:  0.16 m MV Peak grad:  3.3 mmHg    Systemic Diam: 2.20 cm MV Mean grad:  1.0 mmHg MV Vmax:       0.91 m/s MV Vmean:      55.1 cm/s MV Decel Time: 254 msec MV E velocity: 81.80 cm/s MV A velocity: 76.00 cm/s MV E/A ratio:  1.08 Olga Millers MD Electronically signed by Olga Millers MD Signature Date/Time: 02/03/2024/12:50:13 PM    Final    MR ANGIO HEAD WO CONTRAST Result Date: 02/03/2024 CLINICAL DATA:  58 year old female with left side weakness, difficulty speaking, loss of vision in her left eye. Recent cocaine use. EXAM: MRA HEAD WITHOUT CONTRAST TECHNIQUE: Angiographic images of the Circle of Willis were acquired using MRA technique without intravenous contrast. COMPARISON:  Brain MRI today reported separately. Previous intracranial MRA 11/09/2022.  FINDINGS: Anterior circulation: Antegrade flow in both ICA siphons. Chronic tortuosity of the distal cervical right ICA. ICA irregularity in keeping with atherosclerosis but no significant ICA stenosis. Ophthalmic artery origins appear stable and within normal limits. Posterior communicating artery origins remain patent. Infundibulum of the left posterior communicating artery origin again noted (normal variant). Patent carotid termini. Patent MCA and ACA origins. Diminutive or absent anterior communicating artery. Chronic moderate to severe distal A2 segment irregularity and stenosis which is worse on the left side. But no ACA occlusion (series 1040, image 1). This is similar to 2023. Right MCA M1 segment and bifurcation remain patent. Right MCA branches are within normal limits. Left MCA M1 segment and bifurcation remain patent. Mild M1 irregularity. Left MCA branches are within normal limits. Posterior circulation: Antegrade flow in the posterior circulation appears stable since 2023, dominant right vertebral V4 segment. Distal vertebral arteries, left PICA, vertebrobasilar junction are patent. Patent basilar artery, AICA, SCA and PCA origins. There is distal vertebral and basilar artery irregularity suggestive of atherosclerosis, but no hemodynamically significant stenosis. Right greater than left posterior communicating arteries are present. Bilateral PCA branches are patent. But there is new severe left proximal P2 segment irregularity and stenosis since 2023 (series 1052, image 8). And additional mild to moderate distal left P2 stenosis. There is flow signal beyond those lesions. Contralateral right PCA is only mildly irregular in the P2 segment. Anatomic variants: Dominant right vertebral V4 segment. Other: Brain MRI today reported separately. IMPRESSION: 1. Negative for large vessel occlusion. Chronic intracranial irregularity compatible with age advanced atherosclerosis. 2. New since 2023 Severe Left PCA P2  segment stenosis. Chronic Moderate To Severe ACA A2 segment stenoses, worse on the left. Mild intracranial artery irregularity and stenosis otherwise. 3. MRI today reported separately. Electronically Signed   By: Odessa Fleming M.D.   On: 02/03/2024 04:28   MR BRAIN WO CONTRAST Result Date: 02/03/2024 CLINICAL DATA:  58 year old female with left side weakness, difficulty speaking, loss of vision in her left eye. Recent cocaine use. EXAM: MRI HEAD WITHOUT CONTRAST TECHNIQUE: Multiplanar, multiecho pulse sequences of the brain and surrounding structures were obtained without intravenous contrast. COMPARISON:  Intracranial MRA today reported separately. Head CT yesterday. Previous brain MRI 11/09/2022. FINDINGS: Brain: Positive for several small foci of abnormal diffusion in the lateral right thalamus near the posterior limb external capsule which appears restricted on ADC (series 5, images 73 and 75). Underlying numerous chronic lacunar infarcts in the bilateral deep gray nuclei, left corona radiata, anterior body of the left corpus callosum. Faint T2 and FLAIR hyperintensity associated with the restricted diffusion. Several chronic microhemorrhages elsewhere in the deep gray nuclei but  no acute intracranial hemorrhage. No other diffusion restriction. Chronic lacunar infarcts also in the central pons. No cortical encephalomalacia identified. No midline shift, mass effect, evidence of mass lesion, ventriculomegaly, extra-axial collection or acute intracranial hemorrhage. Cervicomedullary junction and pituitary are within normal limits. 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 But the deep gray nuclei chronic hemosiderin is stable. Vascular: Major intracranial vascular flow voids are stable since 2023. Skull and upper cervical spine: Visualized bone marrow signal is within normal limits. Chronic cervical spine disc and endplate degeneration again noted.  Sinuses/Orbits: Stable, negative. Other: Negative visible scalp and face. IMPRESSION: 1. Positive for several small Acute to Subacute Lacunar Infarcts in the lateral Right thalamus, posterior limb right external capsule. No associated hemorrhage or mass effect. 2. Underlying extensive chronic small vessel disease. 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 3. MRA reported separately. Electronically Signed   By: Odessa Fleming M.D.   On: 02/03/2024 04:20   CT HEAD WO CONTRAST Result Date: 02/02/2024 CLINICAL DATA:  Neuro deficit, acute, stroke suspected EXAM: CT HEAD WITHOUT CONTRAST TECHNIQUE: Contiguous axial images were obtained from the base of the skull through the vertex without intravenous contrast. RADIATION DOSE REDUCTION: This exam was performed according to the departmental dose-optimization program which includes automated exposure control, adjustment of the mA and/or kV according to patient size and/or use of iterative reconstruction technique. COMPARISON:  11/09/2022 FINDINGS: Brain: Old lacunar infarcts in the left periventricular white matter and bilateral basal ganglia. Patchy low-density in the periventricular white matter bilaterally is stable. No acute intracranial abnormality. Specifically, no hemorrhage, hydrocephalus, mass lesion, acute infarction, or significant intracranial injury. Vascular: No hyperdense vessel or unexpected calcification. Skull: No acute calvarial abnormality. Sinuses/Orbits: No acute findings Other: None IMPRESSION: Stable patchy chronic small vessel disease in the periventricular white matter. Bilateral lacunar infarcts as above. No acute intracranial abnormality. Electronically Signed   By: Charlett Nose M.D.   On: 02/02/2024 22:40     PHYSICAL EXAM  Temp:  [97.8 F (36.6 C)-99.2 F (37.3 C)] 98.5 F (36.9 C) (03/19 1534) Pulse Rate:  [64-78] 64 (03/19  1534) Resp:  [17-19] 18 (03/19 1534) BP: (130-158)/(74-95) 130/74 (03/19 1534) SpO2:  [90 %-96 %] 91 % (03/19 1534)  General - Well nourished, well developed, in no apparent distress.  Ophthalmologic - fundi not visualized due to noncooperation.  Cardiovascular - Regular rhythm and rate.  Neuro - awake, alert, eyes open, orientated to age, place, time. No aphasia but paucity speech with moderate to severe dysarthria and soft voice, following all simple commands. Able to name and repeat in severely dysarthric voice. No gaze palsy, tracking bilaterally, visual field full. No significant facial droop. Tongue midline. RUE 4/5 at least and LUE proximal 3/5 and distally 4-/5. BLE proximal 3/5 and distal 4/5. Sensation symmetrical bilaterally, R FTN intact, L FTN ataxic but not out of proportion to the weakness. gait not tested.    ASSESSMENT/PLAN Ms. MALLEY HAUTER is a 58 y.o. female with history of hypertension, substance abuse, smoker, stroke, seizure admitted for left-sided weakness, slurred speech and diplopia. No TNK given due to also having no.    Stroke:  right PLIC infarct likely secondary to small vessel disease source CT no acute abnormality MRI right posterior limb internal capsule infarct MRA severe left P2 stenosis, moderate to severe bilateral A2  stenosis, left more than right Carotid Doppler unremarkable 2D Echo EF 60 to 65% LDL 90 HgbA1c 5.8 UDS positive for cocaine Lovenox for VTE prophylaxis clopidogrel 75 mg daily with questionable compliance prior to admission, now on aspirin 81 mg daily and clopidogrel 75 mg daily DAPT for 3 weeks and then Plavix alone Patient counseled to be compliant with her antithrombotic medications Ongoing aggressive stroke risk factor management Therapy recommendations: CIR Disposition: Pending  History of stroke 10/2022 admitted for right pontine infarct and left frontal punctate infarct.  CT old right BG and CR infarct.  MRA bilateral  A2/A3 stenosis, EF 60 to 65%, LDL 152, A1c 6.0, UDS positive for cocaine.  Discharged on DAPT and Crestor 40.  Patient had residual mild gait difficulty.  Hypertension Stable Avoid low BP Long term BP goal normotensive  Hyperlipidemia Home meds: Crestor 40, questionable compliance LDL 90, goal < 70 Now continue Crestor 40 Continue statin at discharge  Tobacco abuse Current smoker Smoking cessation counseling provided Nicotine patch provided Pt is willing to quit  Cocaine abuse UDS positive for cocaine again Cessation indicated provided again Patient is willing to create  Other Stroke Risk Factors   Other Active Problems History of seizure not on AEDs  Hospital day # 0  Neurology will sign off. Please call with questions. Pt will follow up with stroke clinic NP Jessica at [facility] in about 4 weeks. Thanks for the consult.   Marvel Plan, MD PhD Stroke Neurology 02/04/2024 4:17 PM    To contact Stroke Continuity provider, please refer to [email or web address]. After hours, contact General Neurology

## 2024-02-04 NOTE — Plan of Care (Signed)
  Problem: Self-Care: Goal: Ability to participate in self-care as condition permits will improve Outcome: Progressing Goal: Verbalization of feelings and concerns over difficulty with self-care will improve Outcome: Progressing Goal: Ability to communicate needs accurately will improve Outcome: Progressing   Problem: Clinical Measurements: Goal: Ability to maintain clinical measurements within normal limits will improve Outcome: Progressing Goal: Will remain free from infection Outcome: Progressing Goal: Diagnostic test results will improve Outcome: Progressing Goal: Respiratory complications will improve Outcome: Progressing Goal: Cardiovascular complication will be avoided Outcome: Progressing   Problem: Elimination: Goal: Will not experience complications related to bowel motility Outcome: Progressing Goal: Will not experience complications related to urinary retention Outcome: Progressing   Problem: Pain Managment: Goal: General experience of comfort will improve and/or be controlled Outcome: Progressing   Problem: Safety: Goal: Ability to remain free from injury will improve Outcome: Progressing   Problem: Skin Integrity: Goal: Risk for impaired skin integrity will decrease Outcome: Progressing

## 2024-02-04 NOTE — Progress Notes (Signed)
 Physical Therapy Treatment Patient Details Name: Theresa Watkins MRN: [id number] DOB: 05/24/1966 Today's Date: 02/04/2024   History of Present Illness 58 y.o. female presents to [facility] hospital on 02/02/2024 with weakness and blurred vision. MRI shows infarcts in the R thalamus and R posterior limb of external capsule. PMH includes CVA, HTN, HLD, hypothyroidism, cocaine abuse.    PT Comments  Pt is progressing towards goals. Currently pt is Min A for bed mobility, Min A for sit to stand with RW and Mod A for trying to march at [facility] and take a few side steps. Pt fatigues quickly. Pt requires increased assist with wgt shifting. Due to pt current functional status, home set up and available assistance at home recommending skilled physical therapy services > 3 hours/day in order to address strength, balance and functional mobility to decrease risk for falls, injury, immobility, skin break down and re-hospitalization.      If plan is discharge home, recommend the following: A lot of help with walking and/or transfers;Assistance with cooking/housework;Assist for transportation;Help with stairs or ramp for entrance     Equipment Recommendations  Rolling walker (2 wheels);BSC/3in1       Precautions / Restrictions Precautions Precautions: Fall Recall of Precautions/Restrictions: Impaired Restrictions Weight Bearing Restrictions Per Provider Order: No     Mobility  Bed Mobility Overal bed mobility: Needs Assistance Bed Mobility: Supine to Sit, Sit to Supine     Supine to sit: HOB elevated, Min assist, Used rails Sit to supine: Contact guard assist, Used rails   General bed mobility comments: increased time to perform activities. Min A for bringing trunk to mid line with verbal cues for sequencing. CGA for sitting to supine with increased tie and use of rails.    Transfers Overall transfer level: Needs assistance Equipment used: Rolling walker (2 wheels) Transfers: Sit to/from Stand Sit to  Stand: Min assist           General transfer comment: Min A from EOB with heavy cueing for correct hand placement to push up from bed rather than pull up from rolling walker. Pt performed 2x during session.    Ambulation/Gait   Pre-gait activities: Pt performed marching in place and side stepping with poor floor clearance on the L initially that improved with repetitions and Mod A wgt shifting at EOB with RW. Intermittent need for tactile/verbal cues for correct hand placement on RW.     Modified Rankin (Stroke Patients Only) Modified Rankin (Stroke Patients Only) Pre-Morbid Rankin Score: Moderate disability Modified Rankin: Moderately severe disability     Balance Overall balance assessment: Needs assistance Sitting-balance support: No upper extremity supported, Feet supported Sitting balance-Leahy Scale: Fair     Standing balance support: Single extremity supported, Reliant on assistive device for balance Standing balance-Leahy Scale: Poor Standing balance comment: MOd A during marching in Place and MIn A for static standing with RW.      Communication Communication Communication: Impaired Factors Affecting Communication: Difficulty expressing self;Reduced clarity of speech  Cognition Arousal: Alert Behavior During Therapy: WFL for tasks assessed/performed   PT - Cognitive impairments: Difficult to assess, Memory, Problem solving, Safety/Judgement Difficult to assess due to: Impaired communication   Following commands: Intact      Cueing Cueing Techniques: Verbal cues, Tactile cues     General Comments General comments (skin integrity, edema, etc.): Pt was fatigued at end of session.      Pertinent Vitals/Pain Pain Assessment Pain Assessment: No/denies pain     PT Goals (current goals  can now be found in the care plan section) Acute Rehab PT Goals Patient Stated Goal: to return to prior level of function Time For Goal Achievement: 02/17/24 Potential to  Achieve Goals: Fair Progress towards PT goals: Progressing toward goals    Frequency    Min 3X/week      PT Plan  Continue with current POC        AM-PAC PT "6 Clicks" Mobility   Outcome Measure  Help needed turning from your back to your side while in a flat bed without using bedrails?: A Little Help needed moving from lying on your back to sitting on the side of a flat bed without using bedrails?: A Little Help needed moving to and from a bed to a chair (including a wheelchair)?: A Lot Help needed standing up from a chair using your arms (e.g., wheelchair or bedside chair)?: A Little Help needed to walk in hospital room?: Total Help needed climbing 3-5 steps with a railing? : Total 6 Click Score: 13    End of Session Equipment Utilized During Treatment: Gait belt Activity Tolerance: Patient tolerated treatment well Patient left: in bed;with bed alarm set;with call bell/phone within reach Nurse Communication: Mobility status PT Visit Diagnosis: Other abnormalities of gait and mobility (R26.89);Muscle weakness (generalized) (M62.81)     Time: 9811-9147 PT Time Calculation (min) (ACUTE ONLY): 23 min  Charges:    $Therapeutic Activity: 23-37 mins PT General Charges $$ ACUTE PT VISIT: 1 Visit                    Harrel Carina, DPT, CLT  Acute Rehabilitation Services Office: [phone] (Secure chat preferred)     Claudia Desanctis 02/04/2024, 3:43 PM

## 2024-02-05 DIAGNOSIS — R030 Elevated blood-pressure reading, without diagnosis of hypertension: Secondary | ICD-10-CM | POA: Diagnosis not present

## 2024-02-05 DIAGNOSIS — E876 Hypokalemia: Secondary | ICD-10-CM | POA: Diagnosis not present

## 2024-02-05 DIAGNOSIS — I639 Cerebral infarction, unspecified: Secondary | ICD-10-CM | POA: Diagnosis not present

## 2024-02-05 DIAGNOSIS — F1410 Cocaine abuse, uncomplicated: Secondary | ICD-10-CM | POA: Diagnosis not present

## 2024-02-05 NOTE — Progress Notes (Signed)
 [facility]                                                                              Theresa Watkins, is a 58 y.o. female, DOB - [birth year], ZOX:[id number] Admit date - 02/02/2024    Outpatient Primary MD for the patient is Theresa Matar, MD  LOS - 1  days  Chief Complaint  Patient presents with   Aphasia       Brief summary   Patient is a 58 year old female with history of prior stroke, HTN, hyperlipidemia, hypothyroidism, cocaine abuse was found by patient's son that patient was feeling weak and has been having blurred vision when patient did not turn up for his performance and went to check on her at her house.  Also found that patient found it difficult to ambulate.  Patient was brought to the ER.  Last known well was unknown.  ED Course: In the ER patient was noticed to have slurred speech and left-sided weakness.   CT head showed stable patchy chronic small vessel disease in the periventricular white matter, bilateral lacunar infarcts.  Neurology was consulted and patient was admitted for further workup.  Cocaine positive in urine drug screen.    Assessment & Plan    Principal Problem:   Acute CVA (cerebrovascular accident) (HCC) -Presented with blurry vision, slurred speech and left-sided weakness -MRI brain showed several small acute to subacute lacunar infarct in the right lateral thalamus, posterior limb of the right external capsule, no associated hemorrhage or mass effect.  Underlying extensive chronic small vessel disease. -MRA head negative for large vessel occlusion, age advanced atherosclerosis, severe left PCA P2 segment stenosis new since 2023.  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, worse on the left -Neurology consulted, recommended aspirin 81 mg daily, Plavix 75 mg daily for 3 weeks then Plavix alone -PT evaluation recommended  CIR -SLP evaluation recommended dysphagia 2 diet with thin liquids -Counseled on cocaine  cessation -Hemoglobin A1c 5.8, LDL 90, continue statin -2D echo showed EF of 60 to 65%, normal diastolic parameters, no regional WMA -Still with significant expressive aphasia  Active Problems: Tobacco, cocaine use disorder (HCC) -UDS positive for cocaine -Needs counseling, TOC for substance abuse   HLD (hyperlipidemia) -LDL 90, continue Crestor 40 mg daily   Essential hypertension -Hold antihypertensives, allow permissive hypertension   Hypothyroidism Continue Synthroid  Mild hypokalemia -Replaced  Estimated body mass index is 24.69 kg/m as calculated from the following:   Height as of this encounter: 5\' 5"  (1.651 m).   Weight as of this encounter: 67.3 kg.  Code Status: Full code DVT Prophylaxis:  enoxaparin (LOVENOX) injection 40 mg Start: 02/03/24 1000   Level of Care: Level of care: Telemetry Medical Family Communication: updated patient's mother on phone today Disposition Plan:      Remains inpatient appropriate:   Needs CIR   Procedures:  MRI, MRA brain 2D echo  Consultants:   Neurology  Antimicrobials:   Anti-infectives (From admission, onward)    None          Medications  aspirin EC  81 mg Oral Daily   clopidogrel  75 mg Oral Daily   enoxaparin (LOVENOX) injection  40 mg Subcutaneous Q24H   levothyroxine  100 mcg Oral Q0600   rosuvastatin  40 mg Oral Daily   sodium chloride flush  3 mL Intravenous Once      Subjective:   Theresa Watkins was seen and examined today.  Still with significant dysarthria.  No acute nausea vomiting, chest pain or shortness of breath.    Objective:   Vitals:   02/04/24 2320 02/05/24 0318 02/05/24 0722 02/05/24 1135  BP: (!) 152/88 (!) 162/100 136/87 (!) 141/92  Pulse: 70 76  65  Resp: 18 18 20 18   Temp: 98.2 F (36.8 C) 98.6 F (37 C) 98.7 F (37.1 C) 98.5 F (36.9 C)  TempSrc: Oral Oral Oral Oral  SpO2: 98% 100% 97% 100%  Weight:      Height:        Intake/Output Summary (Last 24 hours) at  02/05/2024 1304 Last data filed at 02/05/2024 0021 Gross per 24 hour  Intake --  Output 750 ml  Net -750 ml     Wt Readings from Last 3 Encounters:  02/03/24 67.3 kg  04/17/23 68 kg  12/17/22 72.7 kg    Physical Exam General: Alert and oriented but with severe dysarthria, follows commands Cardiovascular: S1 S2 clear, RRR.  Respiratory: CTAB, no wheezing, rales or rhonchi Gastrointestinal: Soft, nontender, nondistended, NBS Ext: no pedal edema bilaterally Neuro: left-sided weakness Psych: appears to be alert and oriented however still has dysarthria    Data Reviewed:  I have personally reviewed following labs    CBC Lab Results  Component Value Date   WBC 5.6 02/04/2024   RBC 5.14 (H) 02/04/2024   HGB 14.6 02/04/2024   HCT 44.3 02/04/2024   MCV 86.2 02/04/2024   MCH 28.4 02/04/2024   PLT 170 02/04/2024   MCHC 33.0 02/04/2024   RDW 14.2 02/04/2024   LYMPHSABS 1.3 02/02/2024   MONOABS 0.4 02/02/2024   EOSABS 0.0 02/02/2024   BASOSABS 0.0 02/02/2024     Last metabolic panel Lab Results  Component Value Date   NA 140 02/04/2024   K 3.4 (L) 02/04/2024   CL 110 02/04/2024   CO2 24 02/04/2024   BUN 9 02/04/2024   CREATININE 0.61 02/04/2024   GLUCOSE 86 02/04/2024   GFRNONAA >60 02/04/2024   GFRAA 105 08/26/2019   CALCIUM 7.9 (L) 02/04/2024   PROT 6.6 02/03/2024   ALBUMIN 3.5 02/03/2024   LABGLOB 2.8 04/17/2023   AGRATIO 1.6 04/17/2023   BILITOT 0.3 02/03/2024   ALKPHOS 52 02/03/2024   AST 15 02/03/2024   ALT 10 02/03/2024   ANIONGAP 6 02/04/2024    CBG (last 3)  Recent Labs    02/02/24 1953  GLUCAP 117*      Coagulation Profile: Recent Labs  Lab 02/02/24 2029  INR 1.0     Radiology Studies: I have personally reviewed the imaging studies  VAS US CAROTID Result Date: 02/05/2024 Carotid Arterial Duplex Study Patient Name:  Theresa Watkins  Date of Exam:   02/03/2024 Medical Rec #: [id number]          Accession #:    [id number] Date of  Birth: 12/23/1965          Patient Gender: F Patient Age:   53 years Exam Location:  [facility] Procedure:      VAS US CAROTID Referring Phys: Midge Minium --------------------------------------------------------------------------------  Indications:       CVA, Speech disturbance, Visual  disturbance and Weakness. Risk Factors:      Hypertension, hyperlipidemia, current smoker. Limitations        Today's exam was limited due to Labored breathing, pulsatile                    vessels and patient sleeping and snoring. Comparison Study:  Previous study on 2.6.2017. Performing Technologist: Fernande Bras  Examination Guidelines: A complete evaluation includes B-mode imaging, spectral Doppler, color Doppler, and power Doppler as needed of all accessible portions of each vessel. Bilateral testing is considered an integral part of a complete examination. Limited examinations for reoccurring indications may be performed as noted.  Right Carotid Findings: +----------+--------+--------+--------+------------------+--------+           PSV cm/sEDV cm/sStenosisPlaque DescriptionComments +----------+--------+--------+--------+------------------+--------+ CCA Prox  69      14                                         +----------+--------+--------+--------+------------------+--------+ CCA Distal69      18                                         +----------+--------+--------+--------+------------------+--------+ ICA Prox  52      16                                         +----------+--------+--------+--------+------------------+--------+ ICA Mid   74      25                                         +----------+--------+--------+--------+------------------+--------+ ICA Distal122     34                                         +----------+--------+--------+--------+------------------+--------+ ECA       76      11                                          +----------+--------+--------+--------+------------------+--------+ +----------+--------+-------+--------+-------------------+           PSV cm/sEDV cmsDescribeArm Pressure (mmHG) +----------+--------+-------+--------+-------------------+ [id number]     18                                 +----------+--------+-------+--------+-------------------+ +---------+--------+--+--------+--+ VertebralPSV cm/s43EDV cm/s13 +---------+--------+--+--------+--+ Labored breathing, pulsatile vessels and patient sleeping and snoring. Left Carotid Findings: +----------+--------+--------+--------+------------------+--------+           PSV cm/sEDV cm/sStenosisPlaque DescriptionComments +----------+--------+--------+--------+------------------+--------+ CCA Prox  137     24                                         +----------+--------+--------+--------+------------------+--------+ CCA Distal70      18                                         +----------+--------+--------+--------+------------------+--------+  ICA Prox  50      20                                         +----------+--------+--------+--------+------------------+--------+ ICA Mid   111     47                                         +----------+--------+--------+--------+------------------+--------+ ICA Distal94      29                                         +----------+--------+--------+--------+------------------+--------+ ECA       60      11                                         +----------+--------+--------+--------+------------------+--------+ +----------+--------+--------+--------+-------------------+           PSV cm/sEDV cm/sDescribeArm Pressure (mmHG) +----------+--------+--------+--------+-------------------+ [id number]     16                                  +----------+--------+--------+--------+-------------------+ +---------+--------+--+--------+--+ VertebralPSV cm/s55EDV  cm/s13 +---------+--------+--+--------+--+ Labored breathing, pulsatile vessels and patient sleeping and snoring. Elevated velocities in subclavian artery without obvious signs of stenon sis.  Summary: Right Carotid: Velocities in the right ICA are consistent with a 1-39% stenosis. Left Carotid: Velocities in the left ICA are consistent with a 1-39% stenosis.  *See table(s) above for measurements and observations.  Electronically signed by Coral Else MD on 02/05/2024 at 7:42:17 AM.    Final        Thad Ranger M.D. [facility] 02/05/2024, 1:04 PM  Available via Epic secure chat 7am-7pm After 7 pm, please refer to night coverage provider listed on amion.

## 2024-02-05 NOTE — Progress Notes (Signed)
 Physical Therapy Treatment Patient Details Name: Theresa Watkins MRN: [id number] DOB: [birth year] Today's Date: 02/05/2024   History of Present Illness 58 y.o. female presents to [facility] hospital on 02/02/2024 with weakness and blurred vision. MRI shows infarcts in the R thalamus and R posterior limb of external capsule. PMH includes CVA, HTN, HLD, hypothyroidism, cocaine abuse.    PT Comments  Currently pt is not progressing towards goals. Reports dizziness and inability to progress to OOB activities this session. Pt is reporting significant pain in the L hip that was improved with positioning. Due to pt current functional status, home set up and available assistance at home recommending skilled physical therapy services > 3 hours/day in order to address strength, balance and functional mobility to decrease risk for falls, injury, immobility, skin break down and re-hospitalization.      If plan is discharge home, recommend the following: Assistance with cooking/housework;Assist for transportation;Help with stairs or ramp for entrance;Two people to help with walking and/or transfers     Equipment Recommendations  Rolling walker (2 wheels);BSC/3in1       Precautions / Restrictions Precautions Precautions: Fall Recall of Precautions/Restrictions: Impaired Restrictions Weight Bearing Restrictions Per Provider Order: No     Mobility  Bed Mobility Overal bed mobility: Needs Assistance Bed Mobility: Rolling, Sidelying to Sit, Sit to Sidelying Rolling: Min assist Sidelying to sit: Min assist     Sit to sidelying: Min assist General bed mobility comments: verbal cues for sequencing and initiation. Pt sat EOB for ~5 min . Reports dizziness. Then states she has to lay down and we can't go any further today becuase she is weak. Pt educated that she is weak due to CVA and needs to mobilize in order to re-gain strength. Pt continued to state she can go no further.    Transfers   General transfer  comment: Pt declined OOB mobility     Modified Rankin (Stroke Patients Only) Modified Rankin (Stroke Patients Only) Pre-Morbid Rankin Score: Moderate disability Modified Rankin: Moderately severe disability     Balance Overall balance assessment: Needs assistance Sitting-balance support: Feet supported, Single extremity supported Sitting balance-Leahy Scale: Poor Sitting balance - Comments: Intermittent MIn A to maintain mid line position      Communication Communication Communication: Impaired Factors Affecting Communication: Difficulty expressing self;Reduced clarity of speech  Cognition Arousal: Alert Behavior During Therapy: WFL for tasks assessed/performed   PT - Cognitive impairments: Difficult to assess, Memory, Problem solving, Safety/Judgement Difficult to assess due to: Impaired communication       Following commands: Intact      Cueing Cueing Techniques: Verbal cues, Tactile cues     General Comments General comments (skin integrity, edema, etc.): Pt reports fatigue and dizziness; reports she cannot get out of bed today. Pt was positioned on the R side with pillow between knees and reports some relief from L hip pain; educated on weakness and strain on L Hip      Pertinent Vitals/Pain Pain Assessment Pain Assessment: 0-10 Pain Score: 9  Pain Location: L hip Pain Descriptors / Indicators: Discomfort Pain Intervention(s): Patient requesting pain meds-RN notified, Monitored during session, Repositioned     PT Goals (current goals can now be found in the care plan section) Acute Rehab PT Goals Patient Stated Goal: to return to prior level of function PT Goal Formulation: With patient Time For Goal Achievement: 02/17/24 Potential to Achieve Goals: Fair Progress towards PT goals: Not progressing toward goals - comment    Frequency  Min 3X/week      PT Plan  Continue with current POC        AM-PAC PT "6 Clicks" Mobility   Outcome Measure   Help needed turning from your back to your side while in a flat bed without using bedrails?: A Little Help needed moving from lying on your back to sitting on the side of a flat bed without using bedrails?: A Little Help needed moving to and from a bed to a chair (including a wheelchair)?: Total Help needed standing up from a chair using your arms (e.g., wheelchair or bedside chair)?: Total Help needed to walk in hospital room?: Total Help needed climbing 3-5 steps with a railing? : Total 6 Click Score: 10    End of Session   Activity Tolerance: Patient limited by fatigue Patient left: in bed;with bed alarm set;with call bell/phone within reach Nurse Communication: Mobility status PT Visit Diagnosis: Other abnormalities of gait and mobility (R26.89);Muscle weakness (generalized) (M62.81)     Time: 2130-8657 PT Time Calculation (min) (ACUTE ONLY): 13 min  Charges:    $Therapeutic Activity: 8-22 mins PT General Charges $$ ACUTE PT VISIT: 1 Visit                     Harrel Carina, DPT, CLT  Acute Rehabilitation Services Office: [phone] (Secure chat preferred)    Claudia Desanctis 02/05/2024, 11:44 AM

## 2024-02-05 NOTE — Progress Notes (Signed)
 Speech Language Pathology Treatment: Dysphagia;Cognitive-Linquistic  Patient Details Name: Theresa Watkins MRN: [id number] DOB: May 11, 1966 Today's Date: 02/05/2024 Time: 3664-4034 SLP Time Calculation (min) (ACUTE ONLY): 32 min  Assessment / Plan / Recommendation Clinical Impression  Session focused on dysarthria and dysphagia. Pt demonstrates significant deficits in intelligibility. With verbal cues and modeling, she showed improved ability to begin to coordinate respirations and vocal onset; she was able to slow pace and shorten phrases to accommodate loss of respiratory support.    With set-up, she fed herself dys 2 foods from tray with verbal cues to check for pocketing on left and to slow rate. She needed cues to improve awareness of left side and attend to spillage from left.  No concerns for aspiration today; oral phase deficits are moderate.  SLP will follow.    HPI HPI: Patient is a 58 y.o. female with PMH: prior CVA, HTN, HLD, hypothyroidism, cocaine abuse. She presented to the hospital on 02/03/24 after being found down by her son. She was brought to ER. In ER she had slurred speech and left sided weakness. CT head followed by MRI brain showed acute to subacute lacunar infarcts in the lateral right thalamus, posterior limb right external capsule but no hemorrhage or mass effect. Prior infarcts right caudate head/BG, right pons, left frontal punctate infarcts x 2 .  UDS positive for cocaine.      SLP Plan  Continue with current plan of care      Recommendations for follow up therapy are one component of a multi-disciplinary discharge planning process, led by the attending physician.  Recommendations may be updated based on patient status, additional functional criteria and insurance authorization.    Recommendations  Diet recommendations: Dysphagia 2 (fine chop);Thin liquid Liquids provided via: Cup;Straw Medication Administration: Whole meds with puree Supervision: Intermittent  supervision to cue for compensatory strategies Compensations: Slow rate;Small sips/bites;Lingual sweep for clearance of pocketing Postural Changes and/or Swallow Maneuvers: Seated upright 90 degrees                  Oral care BID     Dysarthria and anarthria (R47.1);Dysphagia, oropharyngeal phase (R13.12)     Continue with current plan of care    Darion Milewski L. Samson Frederic, MA CCC/SLP Clinical Specialist - Acute Care SLP Acute Rehabilitation Services Office number [phone]  Theresa Watkins  02/05/2024, 3:18 PM

## 2024-02-05 NOTE — Plan of Care (Signed)

## 2024-02-05 NOTE — Progress Notes (Signed)
 Occupational Therapy Treatment Patient Details Name: Theresa Watkins MRN: [id number] DOB: 23-Feb-1966 Today's Date: 02/05/2024   History of present illness 58 y.o. female presents to [facility] on 02/02/2024 with weakness and blurred vision. MRI shows infarcts in the R thalamus and R posterior limb of external capsule. PMH includes CVA, HTN, HLD, hypothyroidism, cocaine abuse.   OT comments  Pt continues to present with LUE synergy pattern, using it as a gross assist in functional tasks. Pt noted to struggle with maintaining a static sitting position today, needing Min A to Max A to maintain with a posterior lean always present. Not able to assess vision, pt deflecting assessment or gets too distracted by pain, overall she does c/o blurry vision and side by side diplopia. OT to continue to progress pt as able, DC plans remain appropriate for CIR.       If plan is discharge home, recommend the following:  A lot of help with bathing/dressing/bathroom;A lot of help with walking and/or transfers;Assistance with cooking/housework;Direct supervision/assist for medications management;Direct supervision/assist for financial management;Assist for transportation;Help with stairs or ramp for entrance   Equipment Recommendations  None recommended by OT    Recommendations for Other Services      Precautions / Restrictions Precautions Precautions: Fall Recall of Precautions/Restrictions: Impaired Restrictions Weight Bearing Restrictions Per Provider Order: No       Mobility Bed Mobility Overal bed mobility: Needs Assistance Bed Mobility: Rolling, Sidelying to Sit, Sit to Sidelying Rolling: Mod assist Sidelying to sit: Max assist Supine to sit: Mod assist, Used rails     General bed mobility comments: Pt with very poor sitting posture, needing Min to max A to maintain sitting balance. Posterior lean.    Transfers Overall transfer level: Needs assistance Equipment used: None Transfers:  Sit to/from Stand, Bed to chair/wheelchair/BSC Sit to Stand: Mod assist Stand pivot transfers: Mod assist         General transfer comment: Pt needing Mod A for pivots and to assist with weight shift to offload BLEs     Balance Overall balance assessment: Needs assistance Sitting-balance support: Feet supported, Single extremity supported Sitting balance-Leahy Scale: Poor Sitting balance - Comments: Min to Max A with posterior lean Postural control: Posterior lean Standing balance support: Single extremity supported, Reliant on assistive device for balance Standing balance-Leahy Scale: Poor                             ADL either performed or assessed with clinical judgement   ADL   Eating/Feeding:  (pt using LUE to assist with positioning of cup)                           Toileting- Clothing Manipulation and Hygiene: Sit to/from stand;Moderate assistance (pt able to assist with donning/doffing briefs on L side using LUE.)       Functional mobility during ADLs: Moderate assistance (stand pivot)      Extremity/Trunk Assessment              Vision   Vision Assessment?: Vision impaired- to be further tested in functional context Additional Comments: Pt c/o blurry vision and side by side diplopia. Attempted to assess pt vision while she was supported sititng on [facility], she reported that she would like to attempt BM before assessment. Then pt c/o hip pain and requested to get back to EOB, once seated EOB attempted to begin another  visual assessment then pt began to c/o throbbing headache and returned to side lying position. was not able to regain pt focus after headache   Perception     Praxis     Communication Communication Communication: Impaired Factors Affecting Communication: Difficulty expressing self;Reduced clarity of speech   Cognition Arousal: Alert Behavior During Therapy: [facility] for tasks assessed/performed       Awareness: Online  awareness impaired   Attention impairment (select first level of impairment): Selective attention Executive functioning impairment (select all impairments): Problem solving                   Following commands: Intact        Cueing   Cueing Techniques: Verbal cues, Tactile cues  Exercises General Exercises - Upper Extremity Digit Composite Flexion: AROM, Left, 5 reps Composite Extension: AROM, Left, 5 reps Other Exercises Other Exercises: LUE scapular retraction x3    Shoulder Instructions       General Comments Pt c/o hip pain, headache, Pt noted to profusely sweat once seated on BSC. Bp sitting on BSC was 141/92, RN in room doing Neuro assessment    Pertinent Vitals/ Pain       Pain Assessment Pain Assessment: Faces Faces Pain Scale: Hurts even more Pain Location: L hip Pain Descriptors / Indicators: Discomfort Pain Intervention(s): Limited activity within patient's tolerance, Repositioned, Monitored during session  Home Living                                          Prior Functioning/Environment              Frequency  Min 2X/week        Progress Toward Goals  OT Goals(current goals can now be found in the care plan section)  Progress towards OT goals: Progressing toward goals  Acute Rehab OT Goals Patient Stated Goal: to get better OT Goal Formulation: With patient Time For Goal Achievement: 02/17/24 Potential to Achieve Goals: Good  Plan      Co-evaluation                 AM-PAC OT "6 Clicks" Daily Activity     Outcome Measure   Help from another person eating meals?: A Little Help from another person taking care of personal grooming?: A Lot Help from another person toileting, which includes using toliet, bedpan, or urinal?: A Lot Help from another person bathing (including washing, rinsing, drying)?: A Lot Help from another person to put on and taking off regular upper body clothing?: A Lot Help from  another person to put on and taking off regular lower body clothing?: A Lot 6 Click Score: 13    End of Session Equipment Utilized During Treatment: Gait belt  OT Visit Diagnosis: Unsteadiness on feet (R26.81);Other abnormalities of gait and mobility (R26.89);Muscle weakness (generalized) (M62.81);Low vision, both eyes (H54.2);Cognitive communication deficit (R41.841);Hemiplegia and hemiparesis Symptoms and signs involving cognitive functions: Cerebral infarction Hemiplegia - Right/Left: Left Hemiplegia - dominant/non-dominant: Non-Dominant Hemiplegia - caused by: Cerebral infarction   Activity Tolerance Patient tolerated treatment well   Patient Left in bed;with call bell/phone within reach;with bed alarm set   Nurse Communication Mobility status        Time: 8295-6213 OT Time Calculation (min): 37 min  Charges: OT General Charges $OT Visit: 1 Visit OT Treatments $Therapeutic Activity: 23-37 mins  02/05/2024  AB, OTR/L  Acute Rehabilitation Services  Office: [phone]   Tristan Schroeder 02/05/2024, 5:46 PM

## 2024-02-06 DIAGNOSIS — I639 Cerebral infarction, unspecified: Secondary | ICD-10-CM | POA: Diagnosis not present

## 2024-02-06 MED ORDER — POLYETHYLENE GLYCOL 3350 17 G PO PACK
17.0000 g | PACK | Freq: Every day | ORAL | Status: DC
  Administered 2024-02-06 – 2024-02-09 (×4): 17 g via ORAL
  Filled 2024-02-06 (×4): qty 1

## 2024-02-06 NOTE — TOC Progression Note (Signed)
 Transition of Care [facility]) - Progression Note    Patient Details  Name: Theresa Watkins MRN: [id number] Date of Birth: 1966/04/11  Transition of Care [facility]) CM/SW Contact  Kermit Balo, RN Phone Number: 02/06/2024, 10:16 AM  Clinical Narrative:     CIR starting insurance for IR authorization. Mom is working to make sure they have needed support arranged after a CIR stay. ToC following.  Expected Discharge Plan: IP Rehab Facility Barriers to Discharge: Continued Medical Work up  Expected Discharge Plan and Services       Living arrangements for the past 2 months: Single Family Home                                       Social Determinants of Health (SDOH) Interventions SDOH Screenings   Food Insecurity: Food Insecurity Present (02/03/2024)  Housing: High Risk (02/03/2024)  Transportation Needs: Unmet Transportation Needs (02/03/2024)  Utilities: At Risk (02/03/2024)  Depression (PHQ2-9): Medium Risk (09/19/2022)  Tobacco Use: High Risk (02/03/2024)    Readmission Risk Interventions     No data to display

## 2024-02-06 NOTE — PMR Pre-admission (Addendum)
 PMR Admission Coordinator Pre-Admission Assessment  Patient: Theresa Watkins is an 58 y.o., female MRN: [id number] DOB: 22-Nov-1965 Height: 5\' 5"  (165.1 cm) Weight: 67.3 kg              Insurance Information HMO:     PPO:      PCP:      IPA:      80/20:      OTHER:  PRIMARY: [place] Medicaid Healthy [employer]      Policy#: [id number]       Subscriber: pt CM Name: shannon      Phone#: [phone]     Fax#: [phone] Received approval via fax on 3/24 for admit 3/71-0/62 Pre-Cert#: [id number]      Employer:  Benefits:  Phone #: [phone]     Name:  Eff. Date: 02/09/2024     Deduct: $0      Out of Pocket Max: $0      Life Max:   CIR: 100%      SNF:  Outpatient:      Co-Pay:  Home Health:       Co-Pay:  DME:      Co-Pay:  Providers:  SECONDARY:       Policy#:       Phone#:   Artist:       Phone#:   The Data processing manager" for patients in Inpatient Rehabilitation Facilities with attached "Privacy Act Statement-Health Care Records" was provided and verbally reviewed with: Patient and Family  Emergency Contact Information Contact Information     Name Relation Home Work [place]   [place] Mother [phone]  [phone]      Other Contacts     Name Relation Home Work [place] Son   [phone]      Current Medical History  Patient Admitting Diagnosis: CVA   History of Present Illness: [name] is a 58 year old right-handed female with history of hypertension, hyperlipidemia, hypothyroidism, cocaine/polysubstance abuse as well as tobacco use.  Per chart review patient lives alone.  Reportedly used a straight point cane prior to admission.  She does have family in the area.  Presented 02/02/2024 with aphasia as well as left-sided weakness after being found down by her son.  CT/MRI showed several small acute to subacute lacunar infarcts in the lateral right thalamus, posterior limb right external capsule.  No associated  hemorrhage or mass effect.  Underlying extensive chronic small vessel disease.  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  MRA negative for large vessel occlusion.  New since 2023 severe left PCA P2 segment stenosis.  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  Patient did not receive TNK.  Admission chemistries unremarkable except potassium 3.4, urine drug screen positive cocaine, hemoglobin A1c 5.8.  Echocardiogram ejection fraction of 60 to 65% no wall motion abnormalities.  Presently maintained on aspirin 81 mg daily and Plavix 75 mg daily for CVA prophylaxis x 3 weeks then Plavix alone.  Lovenox added for DVT prophylaxis.  Presently on dysphagia #2 thin liquid diet.  Therapy evaluations completed due to patient decreased functional mobility and left-sided weakness was admitted for a comprehensive rehab program.   Complete NIHSS TOTAL: 7 Glasgow Coma Scale Score: 15  Patient's medical record from [facility] has been reviewed by the rehabilitation admission coordinator and physician.  Past Medical History  Past Medical History:  Diagnosis Date  Abnormal Pap smear    Bronchitis    Fibroid    Headache(784.0)    Ovarian cyst    Plantar fasciitis    Seizures (HCC)    Stroke [facility])    Thyroid cancer (HCC) 20011   Trichomonas    Urinary tract infection     Has the patient had major surgery during 100 days prior to admission? No  Family History  family history includes Bell's palsy in her mother; Breast cancer in her cousin; Colon cancer in her maternal aunt and paternal aunt; Diabetes in her mother and sister; Diverticulitis in her sister; Hypertension in her mother and sister; Prostate cancer in her father; Stroke in her father.   Current Medications   Current Facility-Administered Medications:    acetaminophen (TYLENOL) tablet 650 mg, 650 mg, Oral, Q4H PRN, 650 mg  at 02/05/24 1147 **OR** acetaminophen (TYLENOL) 160 MG/5ML solution 650 mg, 650 mg, Per Tube, Q4H PRN **OR** acetaminophen (TYLENOL) suppository 650 mg, 650 mg, Rectal, Q4H PRN, Eduard Clos, MD   [COMPLETED] aspirin EC tablet 325 mg, 325 mg, Oral, Once, 325 mg at 02/03/24 1346 **FOLLOWED BY** aspirin EC tablet 81 mg, 81 mg, Oral, Daily, Marvel Plan, MD, 81 mg at 02/06/24 0841   clopidogrel (PLAVIX) tablet 75 mg, 75 mg, Oral, Daily, Marvel Plan, MD, 75 mg at 02/06/24 0841   enoxaparin (LOVENOX) injection 40 mg, 40 mg, Subcutaneous, Q24H, Eduard Clos, MD, 40 mg at 02/06/24 4098   levothyroxine (SYNTHROID) tablet 100 mcg, 100 mcg, Oral, Q0600, Rai, Ripudeep K, MD, 100 mcg at 02/06/24 1191   Oral care mouth rinse, 15 mL, Mouth Rinse, PRN, Rai, Ripudeep K, MD   rosuvastatin (CRESTOR) tablet 40 mg, 40 mg, Oral, Daily, Marvel Plan, MD, 40 mg at 02/06/24 0840   sodium chloride flush (NS) 0.9 % injection 3 mL, 3 mL, Intravenous, Once, Prosperi, Christian H, PA-C  Patients Current Diet:  Diet Order             DIET DYS 2 Room service appropriate? Yes; Fluid consistency: Thin  Diet effective now                   Precautions / Restrictions Precautions Precautions: Fall Restrictions Weight Bearing Restrictions Per Provider Order: No   Has the patient had 2 or more falls or a fall with injury in the past year?No  Prior Activity Level Community (5-7x/wk): independent, occasionally used RW or SPC, mom takes to appointments, pt lived independently  Prior Functional Level Prior Function Prior Level of Function : Patient poor historian/Family not available (Information from chart review) Mobility Comments: pt reports ambulating with use of SPC. Pt is often difficult to understand throughout session due to dysarthria and expressive aphasia.  Self Care: Did the patient need help bathing, dressing, using the toilet or eating?  Independent  Indoor Mobility: Did the patient need  assistance with walking from room to room (with or without device)? Independent  Stairs: Did the patient need assistance with internal or external stairs (with or without device)? Independent  Functional Cognition: Did the patient need help planning regular tasks such as shopping or remembering to take medications? Independent  Patient Information Are you of Hispanic, Latino/a,or Spanish origin?: A. No, not of Hispanic, Latino/a, or Spanish origin What is your race?: B. Black or African American Do you need or want an interpreter to communicate with a doctor or health care staff?: 0. No  Patient's Response To:  Health Literacy and Transportation Is  the patient able to respond to health literacy and transportation needs?: Yes Health Literacy - How often do you need to have someone help you when you read instructions, pamphlets, or other written material from your doctor or pharmacy?: Never In the past 12 months, has lack of transportation kept you from medical appointments or from getting medications?: No In the past 12 months, has lack of transportation kept you from meetings, work, or from getting things needed for daily living?: No  Home Assistive Devices / Equipment Home Equipment: Gilmer Mor - single point  Prior Device Use: Indicate devices/aids used by the patient prior to current illness, exacerbation or injury? Walker and cane PRN  Current Functional Level Cognition  Arousal/Alertness: Awake/alert Overall Cognitive Status: No family/caregiver present to determine baseline cognitive functioning Orientation Level: Oriented X4 Attention: Sustained Sustained Attention: Appears intact Memory: Impaired Memory Impairment: Decreased recall of new information Awareness: Impaired Awareness Impairment: Emergent impairment    Extremity Assessment (includes Sensation/Coordination)  Upper Extremity Assessment: Right hand dominant, LUE deficits/detail RUE Deficits / Details: grossly 4/5, ROM  WFL LUE Deficits / Details: grossly 4-/5, ROM WFL LUE:  (moving out of synergy pattern; able to touch top of head in supine - difficulty against gravity in sitting; gross grasp/release; poor in-hand manipulation skills; attempting to use as a gross assist) LUE Coordination: decreased fine motor, decreased gross motor  Lower Extremity Assessment: Defer to PT evaluation LLE Deficits / Details: grossly 4-/5    ADLs  Overall ADL's : Needs assistance/impaired Eating/Feeding:  (pt using LUE to assist with positioning of cup) Grooming: Moderate assistance, Sitting Upper Body Bathing: Moderate assistance, Sitting Lower Body Bathing: Maximal assistance, Bed level Upper Body Dressing : Maximal assistance, Sitting Lower Body Dressing: Maximal assistance, Bed level Toilet Transfer: Moderate assistance, Squat-pivot Toileting- Clothing Manipulation and Hygiene: Sit to/from stand, Moderate assistance (pt able to assist with donning/doffing briefs on L side using LUE.) Functional mobility during ADLs: Moderate assistance (stand pivot)    Mobility  Overal bed mobility: Needs Assistance Bed Mobility: Rolling, Sidelying to Sit, Sit to Sidelying Rolling: Mod assist Sidelying to sit: Max assist Supine to sit: Mod assist, Used rails Sit to supine: Contact guard assist, Used rails Sit to sidelying: Min assist General bed mobility comments: Pt with very poor sitting posture, needing Min to max A to maintain sitting balance. Posterior lean.    Transfers  Overall transfer level: Needs assistance Equipment used: None Transfers: Sit to/from Stand, Bed to chair/wheelchair/BSC Sit to Stand: Mod assist Bed to/from chair/wheelchair/BSC transfer type:: Stand pivot Stand pivot transfers: Mod assist General transfer comment: Pt needing Mod A for pivots and to assist with weight shift to offload BLEs    Ambulation / Gait / Stairs / Wheelchair Mobility  Ambulation/Gait Pre-gait activities: Pt performed marching  in place and side stepping with poor floor clearance on the L initially that improved with repetitions and Mod A wgt shifting at EOB with RW. Intermittent need for tactile/verbal cues for correct hand placement on RW.    Posture / Balance Dynamic Sitting Balance Sitting balance - Comments: Min to Max A with posterior lean Balance Overall balance assessment: Needs assistance Sitting-balance support: Feet supported, Single extremity supported Sitting balance-Leahy Scale: Poor Sitting balance - Comments: Min to Max A with posterior lean Postural control: Posterior lean Standing balance support: Single extremity supported, Reliant on assistive device for balance Standing balance-Leahy Scale: Poor Standing balance comment: MOd A during marching in Place and MIn A for static standing with RW.  Special needs/care consideration N/a     Previous Home Environment (from acute therapy documentation) Living Arrangements: Alone  Lives With: Alone Available Help at Discharge: Family, Available PRN/intermittently Type of Home: Apartment Home Layout: One level Home Access: Stairs to enter Entrance Stairs-Rails: None Entrance Stairs-Number of Steps: 3 Bathroom Shower/Tub: Engineer, manufacturing systems: Standard Bathroom Accessibility: Yes How Accessible: Accessible via walker Home Care Services: No  Discharge Living Setting Plans for Discharge Living Setting: Lives with (comment) (mom's townhome) Type of Home at Discharge: Other (Comment) (townhome) Discharge Home Layout: Two level, 1/2 bath on main level Discharge Home Access: Stairs to enter Entrance Stairs-Rails: Right, Left Entrance Stairs-Number of Steps: 1+2 Discharge Bathroom Shower/Tub: Tub/shower unit Discharge Bathroom Toilet: Standard Discharge Bathroom Accessibility: No Does the patient have any problems obtaining your medications?: Yes (Describe)  Social/Family/Support Systems Anticipated Caregiver: mom, Lyndzie Zentz Anticipated Industrial/product designer Information: [phone] (home number is best contact**) Ability/Limitations of Caregiver: min assist at the most Caregiver Availability: 24/7 Discharge Plan Discussed with Primary Caregiver: Yes Is Caregiver In Agreement with Plan?: Yes Does Caregiver/Family have Issues with Lodging/Transportation while Pt is in Rehab?: No   Goals Patient/Family Goal for Rehab: PT/OT supervision to mod I, SLP supervision to min assist Expected length of stay: 14-16 days Additional Information: Discharge plan: pt will discharge to her mom's townhome where she will have 24/7 supervision/min assist Pt/Family Agrees to Admission and willing to participate: Yes Program Orientation Provided & Reviewed with Pt/Caregiver Including Roles  & Responsibilities: Yes   Decrease burden of Care through IP rehab admission: n/a   Possible need for SNF placement upon discharge: Not anticipated.  Plan for discharge to pt's mother's townhome where mom can provide 24/7 assist.     Patient Condition: This patient's condition remains as documented in the consult dated 02/04/24, in which the Rehabilitation Physician determined and documented that the patient's condition is appropriate for intensive rehabilitative care in an inpatient rehabilitation facility. Will admit to inpatient rehab today.  Preadmission Screen Completed By:  Stephania Fragmin, PT, DPT 02/06/2024 10:13 AM ______________________________________________________________________   Discussed status with Dr. Wynn Banker on 02/09/24 at 900 and received approval for admission today.  Admission Coordinator:  Stephania Fragmin, time1319/Date3/24/25  with updates by Megan Salon, MS, CCC-SLP

## 2024-02-06 NOTE — Progress Notes (Signed)
 Physical Therapy Treatment Patient Details Name: Theresa Watkins MRN: [id number] DOB: 02-10-1966 Today's Date: 02/06/2024   History of Present Illness 58 y.o. female presents to [facility] hospital on 02/02/2024 with weakness and blurred vision. MRI shows infarcts in the R thalamus and R posterior limb of external capsule. PMH includes CVA, HTN, HLD, hypothyroidism, cocaine abuse.    PT Comments  Pt is progressing slowly towards goals. Fluctuations in abilities noted. Pt was Min A for bed mobility, Min-Mod A for sit to stand with RW and attempted stepping today for gait. Pt has significant difficulty with L foot placement and control with feet progressing anterior to COM and increased posterior LOB. Due to pt current functional status, home set up and available assistance at home recommending skilled physical therapy services > 3 hours/day in order to address strength, balance and functional mobility to decrease risk for falls, injury, immobility, skin break down and re-hospitalization.     If plan is discharge home, recommend the following: Assistance with cooking/housework;Assist for transportation;Help with stairs or ramp for entrance;Two people to help with walking and/or transfers     Equipment Recommendations  Rolling walker (2 wheels);BSC/3in1       Precautions / Restrictions Precautions Precautions: Fall Recall of Precautions/Restrictions: Impaired Restrictions Weight Bearing Restrictions Per Provider Order: No     Mobility  Bed Mobility Overal bed mobility: Needs Assistance Bed Mobility: Rolling, Supine to Sit Rolling: Min assist   Supine to sit: Min assist, Used rails     General bed mobility comments: 1x LOB during sitting EOB. Supervision mostly    Transfers Overall transfer level: Needs assistance Equipment used: Rolling walker (2 wheels) Transfers: Sit to/from Stand Sit to Stand: Min assist, Mod assist Stand pivot transfers: Mod assist         General transfer  comment: Sit to stand 4x during session with Min to Mod A with verbal cues for sequencing and hand placement for safety. 3x from bed at Mod A and 1x from recliner at Min A. Assist keeping L hand on RW. Pt was stand pivot for transfer due to difficulty progressing LLE.    Ambulation/Gait     Pre-gait activities: Attempted gait. Pt was able to take ~6 steps with poor progression of LLE with ataxic movements and poor foot placement. Despite max multi modal cueing LE became anterior to COM creating a heavy posterior lean. Pt was assisted at Total A back to bed to prevent fall.      Modified Rankin (Stroke Patients Only) Modified Rankin (Stroke Patients Only) Pre-Morbid Rankin Score: Moderate disability Modified Rankin: Moderately severe disability     Balance Overall balance assessment: Needs assistance Sitting-balance support: Feet supported, Single extremity supported Sitting balance-Leahy Scale: Poor Sitting balance - Comments: 1x Max A for L ,loss of balance. Postural control: Posterior lean Standing balance support: Reliant on assistive device for balance, Bilateral upper extremity supported Standing balance-Leahy Scale: Poor Standing balance comment: MOd A during marching in Place and MIn A for static standing with RW.      Communication Communication Communication: Impaired Factors Affecting Communication: Difficulty expressing self;Reduced clarity of speech  Cognition Arousal: Alert Behavior During Therapy: WFL for tasks assessed/performed   PT - Cognitive impairments: Difficult to assess, Memory, Problem solving, Safety/Judgement Difficult to assess due to: Impaired communication     Following commands: Intact      Cueing Cueing Techniques: Verbal cues, Tactile cues     General Comments General comments (skin integrity, edema, etc.): Pt reports some  blurry vision after mobility, Pt was fatigued after session. Motivated to go to CIR.      Pertinent Vitals/Pain  Pain Assessment Pain Assessment: Faces Faces Pain Scale: Hurts a little bit Pain Location: L hip Pain Descriptors / Indicators: Discomfort Pain Intervention(s): Limited activity within patient's tolerance, Monitored during session           PT Goals (current goals can now be found in the care plan section) Acute Rehab PT Goals Patient Stated Goal: to return to prior level of function PT Goal Formulation: With patient Time For Goal Achievement: 02/17/24 Potential to Achieve Goals: Fair Progress towards PT goals: Progressing toward goals    Frequency    Min 3X/week      PT Plan  Continue with current POC        AM-PAC PT "6 Clicks" Mobility   Outcome Measure  Help needed turning from your back to your side while in a flat bed without using bedrails?: A Little Help needed moving from lying on your back to sitting on the side of a flat bed without using bedrails?: A Little Help needed moving to and from a bed to a chair (including a wheelchair)?: A Lot Help needed standing up from a chair using your arms (e.g., wheelchair or bedside chair)?: A Lot Help needed to walk in hospital room?: Total Help needed climbing 3-5 steps with a railing? : Total 6 Click Score: 12    End of Session Equipment Utilized During Treatment: Gait belt Activity Tolerance: Patient limited by fatigue;Other (comment) (reports some blurry vision) Patient left: with call bell/phone within reach;in chair;with chair alarm set Nurse Communication: Other (comment);Mobility status (blurry vision, assist with ordering tray rather than automatic tray) PT Visit Diagnosis: Other abnormalities of gait and mobility (R26.89);Muscle weakness (generalized) (M62.81)     Time: 0932-3557 PT Time Calculation (min) (ACUTE ONLY): 31 min  Charges:    $Therapeutic Activity: 23-37 mins PT General Charges $$ ACUTE PT VISIT: 1 Visit                    Harrel Carina, DPT, CLT  Acute Rehabilitation  Services Office: [phone] (Secure chat preferred)    Claudia Desanctis 02/06/2024, 1:15 PM

## 2024-02-06 NOTE — Progress Notes (Addendum)
 Inpatient Rehab Coordinator Note:  I spoke with patient's mother, [name], over the phone to discuss CIR recommendations and goals/expectations of CIR stay.  We reviewed 3 hrs/day of therapy, physician follow up, and average length of stay 2 weeks (dependent upon progress) with goals of supervision to min assist.  She initially stated that pt did not have anyone who could stay with her 24/7, but then suggested pt could stay with her at discharge and she could provide 24/7 supervision.  I will f/u with her at 2 to confirm expected level of care at discharge from rehab.  I will start insurance request in the meantime.   1427: Spoke to mom again.  Expect likely supervision mobility and up to min assist with ADLs.  Mom can provide this level of care.  Will continue to follow.   Estill Dooms, PT, DPT Admissions Coordinator [phone] 02/06/24  9:57 AM

## 2024-02-06 NOTE — Plan of Care (Signed)
  Problem: Education: Goal: Knowledge of patient specific risk factors will improve (DELETE if not current risk factor) Outcome: Progressing   Problem: Ischemic Stroke/TIA Tissue Perfusion: Goal: Complications of ischemic stroke/TIA will be minimized Outcome: Progressing   Problem: Coping: Goal: Will verbalize positive feelings about self Outcome: Progressing   Problem: Health Behavior/Discharge Planning: Goal: Goals will be collaboratively established with patient/family Outcome: Progressing

## 2024-02-06 NOTE — Progress Notes (Signed)
 [facility]                                                                              Becki Mccaskill, is a 58 y.o. female, DOB - 22-Jan-1966, HKV:[id number] Admit date - 02/02/2024    Outpatient Primary MD for the patient is Marcine Matar, MD  LOS - 2  days  Chief Complaint  Patient presents with   Aphasia       Brief summary   Patient is a 58 year old female with history of prior stroke, HTN, hyperlipidemia, hypothyroidism, cocaine abuse was found by patient's son that patient was feeling weak and has been having blurred vision when patient did not turn up for his performance and went to check on her at her house.  Also found that patient found it difficult to ambulate.  Patient was brought to the ER.  Last known well was unknown.  ED Course: In the ER patient was noticed to have slurred speech and left-sided weakness.   CT head showed stable patchy chronic small vessel disease in the periventricular white matter, bilateral lacunar infarcts.  Neurology was consulted and patient was admitted for further workup.  Cocaine positive in urine drug screen.   02/06/2024: Patient seen.  No new changes.  Awaiting disposition.   Assessment & Plan    Principal Problem:   Acute CVA (cerebrovascular accident) (HCC) -Presented with blurry vision, slurred speech and left-sided weakness -MRI brain showed several small acute to subacute lacunar infarct in the right lateral thalamus, posterior limb of the right external capsule, no associated hemorrhage or mass effect.  Underlying extensive chronic small vessel disease. -MRA head negative for large vessel occlusion, age advanced atherosclerosis, severe left PCA P2 segment stenosis new since 2023.  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  Active Problems: Tobacco, cocaine use disorder (HCC) -UDS positive for cocaine -Counseled to quit.   HLD (hyperlipidemia) -LDL 90, continue Crestor 40 mg daily   Essential hypertension -Hold antihypertensives, allow permissive hypertension   Hypothyroidism Continue Synthroid  Mild hypokalemia -Replaced  Estimated body mass index is 24.69 kg/m as calculated from the following:   Height as of this encounter: 5\' 5"  (1.651 m).   Weight as of this encounter: 67.3 kg.  Code Status: Full code DVT Prophylaxis:  enoxaparin (LOVENOX) injection 40 mg Start: 02/03/24 1000   Level of Care: Level of care: Telemetry Medical Family Communication: updated patient's mother on phone today Disposition Plan:      Remains inpatient appropriate:   Needs CIR   Procedures:  MRI, MRA brain 2D echo  Consultants:   Neurology  Antimicrobials:   Anti-infectives (From admission, onward)    None          Medications  aspirin EC  81 mg Oral Daily   clopidogrel  75 mg Oral Daily   enoxaparin (LOVENOX) injection  40 mg Subcutaneous Q24H   levothyroxine  100 mcg Oral Q0600   polyethylene glycol  17 g Oral Daily   rosuvastatin  40 mg Oral Daily   sodium chloride flush  3 mL Intravenous Once      Subjective:  No new complaints.  Objective:   Vitals:   02/06/24 0900 02/06/24 1141 02/06/24 1632 02/06/24 1930  BP: 136/87 130/85 (!) 140/82 135/78  Pulse: 78 (!) 57 64 69  Resp:  17 18 17   Temp: 98.2 F (36.8 C) 98.7 F (37.1 C) 98.4 F (36.9 C) 98.8 F (37.1 C)  TempSrc: Oral Oral Oral Oral  SpO2: 98% 96% 94% 94%  Weight:      Height:        Intake/Output Summary (Last 24 hours) at 02/06/2024  2059 Last data filed at 02/06/2024 0959 Gross per 24 hour  Intake 720 ml  Output 1300 ml  Net -580 ml     Wt Readings from Last 3 Encounters:  02/03/24 67.3 kg  04/17/23 68 kg  12/17/22 72.7 kg    Physical Exam General: Alert and oriented but with severe dysarthria, follows commands Cardiovascular: S1 S2 clear, RRR.  Respiratory: CTAB, no wheezing, rales or rhonchi Gastrointestinal: Soft, nontender, nondistended, NBS Ext: no pedal edema bilaterally Neuro: left-sided weakness Psych: appears to be alert and oriented however still has dysarthria    Data Reviewed:  I have personally reviewed following labs    CBC Lab Results  Component Value Date   WBC 5.6 02/04/2024   RBC 5.14 (H) 02/04/2024   HGB 14.6 02/04/2024   HCT 44.3 02/04/2024   MCV 86.2 02/04/2024   MCH 28.4 02/04/2024   PLT 170 02/04/2024   MCHC 33.0 02/04/2024   RDW 14.2 02/04/2024   LYMPHSABS 1.3 02/02/2024   MONOABS 0.4 02/02/2024   EOSABS 0.0 02/02/2024   BASOSABS 0.0 02/02/2024     Last metabolic panel Lab Results  Component Value Date   NA 140 02/04/2024   K 3.4 (L) 02/04/2024   CL 110 02/04/2024   CO2 24 02/04/2024   BUN 9 02/04/2024   CREATININE 0.61 02/04/2024   GLUCOSE 86 02/04/2024   GFRNONAA >60 02/04/2024   GFRAA 105 08/26/2019   CALCIUM 7.9 (L) 02/04/2024   PROT 6.6 02/03/2024   ALBUMIN 3.5 02/03/2024   LABGLOB 2.8 04/17/2023   AGRATIO 1.6 04/17/2023   BILITOT 0.3 02/03/2024   ALKPHOS 52 02/03/2024   AST 15 02/03/2024   ALT 10 02/03/2024   ANIONGAP 6 02/04/2024    CBG (last 3)  No results for input(s): "GLUCAP" in the last 72 hours.     Coagulation Profile: Recent Labs  Lab 02/02/24 2029  INR 1.0     Radiology Studies: I have personally reviewed the imaging studies  No results found.   Time spent: 35 minutes.   Barnetta Chapel M.D. [facility] 02/06/2024, 8:59 PM  Available via Epic secure chat 7am-7pm After 7 pm, please refer to night  coverage provider listed on amion.

## 2024-02-07 ENCOUNTER — Inpatient Hospital Stay (HOSPITAL_COMMUNITY)

## 2024-02-07 DIAGNOSIS — R4701 Aphasia: Secondary | ICD-10-CM | POA: Diagnosis not present

## 2024-02-07 DIAGNOSIS — I639 Cerebral infarction, unspecified: Secondary | ICD-10-CM | POA: Diagnosis not present

## 2024-02-07 NOTE — Progress Notes (Signed)
 [facility]                                                                              Theresa Watkins, is a 58 y.o. female, DOB - 1966/05/08, ZOX:[id number] Admit date - 02/02/2024    Outpatient Primary MD for the patient is Marcine Matar, MD  LOS - 3  days  Chief Complaint  Patient presents with   Aphasia       Brief summary   Patient is a 58 year old female with history of prior stroke, HTN, hyperlipidemia, hypothyroidism, cocaine abuse was found by patient's son that patient was feeling weak and has been having blurred vision when patient did not turn up for his performance and went to check on her at her house.  Also found that patient found it difficult to ambulate.  Patient was brought to the ER.  Last known well was unknown.  ED Course: In the ER patient was noticed to have slurred speech and left-sided weakness.   CT head showed stable patchy chronic small vessel disease in the periventricular white matter, bilateral lacunar infarcts.  Neurology was consulted and patient was admitted for further workup.  Cocaine positive in urine drug screen.   02/07/2024: Patient seen.  No new changes.  Awaiting disposition.   Assessment & Plan    Principal Problem:   Acute CVA (cerebrovascular accident) (HCC) -Presented with blurry vision, slurred speech and left-sided weakness -MRI brain showed several small acute to subacute lacunar infarct in the right lateral thalamus, posterior limb of the right external capsule, no associated hemorrhage or mass effect.  Underlying extensive chronic small vessel disease. -MRA head negative for large vessel occlusion, age advanced atherosclerosis, severe left PCA P2 segment stenosis new since 2023.  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  Active Problems: Tobacco, cocaine use disorder (HCC) -UDS positive for cocaine -Counseled to quit.   HLD (hyperlipidemia) -LDL 90, continue Crestor 40 mg daily   Essential hypertension -Hold antihypertensives, allow permissive hypertension   Hypothyroidism Continue Synthroid  Mild hypokalemia -Replaced  Estimated body mass index is 24.69 kg/m as calculated from the following:   Height as of this encounter: 5\' 5"  (1.651 m).   Weight as of this encounter: 67.3 kg.  Code Status: Full code DVT Prophylaxis:  enoxaparin (LOVENOX) injection 40 mg Start: 02/03/24 1000   Level of Care: Level of care: Telemetry Medical Family Communication: updated patient's mother on phone today Disposition Plan:      Remains inpatient appropriate:   Needs CIR   Procedures:  MRI, MRA brain 2D echo  Consultants:   Neurology  Antimicrobials:   Anti-infectives (From admission, onward)    None          Medications  aspirin EC  81 mg Oral Daily   clopidogrel  75 mg Oral Daily   enoxaparin (LOVENOX) injection  40 mg Subcutaneous Q24H   levothyroxine  100 mcg Oral Q0600   polyethylene glycol  17 g Oral Daily   rosuvastatin  40 mg Oral Daily   sodium chloride flush  3 mL Intravenous Once      Subjective:  No new complaints.  Objective:   Vitals:   02/07/24 0119 02/07/24 0351 02/07/24 0856 02/07/24 1222  BP: (!) 144/97 128/82 132/80 113/80  Pulse: 64  60 67  Resp: 18  18 19   Temp: 97.8 F (36.6 C)  98.3 F (36.8 C) 98.5 F (36.9 C)  TempSrc: Axillary Axillary Oral Oral  SpO2: 97% 94% 94% 97%  Weight:      Height:        Intake/Output Summary (Last 24 hours) at 02/07/2024 1428 Last data filed  at 02/07/2024 1200 Gross per 24 hour  Intake 140 ml  Output 201 ml  Net -61 ml     Wt Readings from Last 3 Encounters:  02/03/24 67.3 kg  04/17/23 68 kg  12/17/22 72.7 kg    Physical Exam General: Alert and oriented but with severe dysarthria, follows commands Cardiovascular: S1 S2 clear, RRR.  Respiratory: CTAB, no wheezing, rales or rhonchi Gastrointestinal: Soft, nontender, nondistended, NBS Ext: no pedal edema bilaterally Neuro: left-sided weakness Psych: appears to be alert and oriented however still has dysarthria    Data Reviewed:  I have personally reviewed following labs    CBC Lab Results  Component Value Date   WBC 5.6 02/04/2024   RBC 5.14 (H) 02/04/2024   HGB 14.6 02/04/2024   HCT 44.3 02/04/2024   MCV 86.2 02/04/2024   MCH 28.4 02/04/2024   PLT 170 02/04/2024   MCHC 33.0 02/04/2024   RDW 14.2 02/04/2024   LYMPHSABS 1.3 02/02/2024   MONOABS 0.4 02/02/2024   EOSABS 0.0 02/02/2024   BASOSABS 0.0 02/02/2024     Last metabolic panel Lab Results  Component Value Date   NA 140 02/04/2024   K 3.4 (L) 02/04/2024   CL 110 02/04/2024   CO2 24 02/04/2024   BUN 9 02/04/2024   CREATININE 0.61 02/04/2024   GLUCOSE 86 02/04/2024   GFRNONAA >60 02/04/2024   GFRAA 105 08/26/2019   CALCIUM 7.9 (L) 02/04/2024   PROT 6.6 02/03/2024   ALBUMIN 3.5 02/03/2024   LABGLOB 2.8 04/17/2023   AGRATIO 1.6 04/17/2023   BILITOT 0.3 02/03/2024   ALKPHOS 52 02/03/2024   AST 15 02/03/2024   ALT 10 02/03/2024   ANIONGAP 6 02/04/2024    CBG (last 3)  No results for input(s): "GLUCAP" in the last 72 hours.     Coagulation Profile: Recent Labs  Lab 02/02/24 2029  INR 1.0     Radiology Studies: I have personally reviewed the imaging studies  CT HEAD WO CONTRAST ( ) Result Date: 02/07/2024 CLINICAL DATA:  58 year old female with aphasia. Recent lacunar infarcts in the right lateral thalamus, internal capsule. EXAM: CT HEAD WITHOUT CONTRAST TECHNIQUE:  Contiguous axial images were obtained from the base of the skull through the vertex without intravenous contrast. RADIATION DOSE REDUCTION: This exam was performed according to the departmental dose-optimization program which includes automated exposure control, adjustment of the mA and/or kV according to patient size and/or use of iterative reconstruction technique. COMPARISON:  Brain MRI 02/03/2024 and head CT 02/02/2024. FINDINGS: Brain: Small recent lacunar infarcts now apparent on CT series 3, image  14. No hemorrhagic transformation or mass effect. Chronic bilateral basal ganglia, left corona radiata lacunar infarcts appear stable. Stable cerebral volume. No midline shift, mass effect, or evidence of intracranial mass lesion. No ventriculomegaly. No acute intracranial hemorrhage identified. No cortically based acute infarct identified. Vascular: No suspicious intracranial vascular hyperdensity. Skull: Intact, stable. Sinuses/Orbits: Visualized paranasal sinuses and mastoids are stable and well aerated. Other: No acute orbit or scalp soft tissue finding. IMPRESSION: 1. Expected CT appearance of recent right thalamic region lacunar infarcts. No hemorrhagic transformation or mass effect. 2. Underlying chronic small vessel disease. No new acute intracranial abnormality. Electronically Signed   By: Odessa Fleming M.D.   On: 02/07/2024 12:12     Time spent: 25 minutes.   Barnetta Chapel M.D. [facility] 02/07/2024, 2:28 PM  Available via Epic secure chat 7am-7pm After 7 pm, please refer to night coverage provider listed on amion.

## 2024-02-07 NOTE — Plan of Care (Signed)
  Problem: Health Behavior/Discharge Planning: Goal: Ability to manage health-related needs will improve Outcome: Progressing Goal: Goals will be collaboratively established with patient/family Outcome: Progressing   Problem: Self-Care: Goal: Ability to participate in self-care as condition permits will improve Outcome: Progressing Goal: Verbalization of feelings and concerns over difficulty with self-care will improve Outcome: Progressing Goal: Ability to communicate needs accurately will improve Outcome: Progressing   Problem: Nutrition: Goal: Risk of aspiration will decrease Outcome: Progressing Goal: Dietary intake will improve Outcome: Progressing   Problem: Clinical Measurements: Goal: Ability to maintain clinical measurements within normal limits will improve Outcome: Progressing Goal: Will remain free from infection Outcome: Progressing Goal: Diagnostic test results will improve Outcome: Progressing Goal: Respiratory complications will improve Outcome: Progressing Goal: Cardiovascular complication will be avoided Outcome: Progressing   Problem: Pain Managment: Goal: General experience of comfort will improve and/or be controlled Outcome: Progressing   Problem: Elimination: Goal: Will not experience complications related to bowel motility Outcome: Progressing Goal: Will not experience complications related to urinary retention Outcome: Progressing   Problem: Safety: Goal: Ability to remain free from injury will improve Outcome: Progressing   Problem: Skin Integrity: Goal: Risk for impaired skin integrity will decrease Outcome: Progressing

## 2024-02-07 NOTE — Progress Notes (Signed)
 05:53H Patient reported that she cannot move totally her left arm.

## 2024-02-07 NOTE — Progress Notes (Signed)
 [facility] night cross cover note:   In this patient who was admitted 4 days ago for acute ischemic CVA with associated persistent residual expressive aphasia and persistent left-sided weakness, including documentation of persistent left sided weakness and expressive aphasia in yesterday's progress note, patient is reporting LUE weakness this morning. MRI brain at time of admit showed multiple small acute to subacute lacunar infarcts involving the right lateral thalamus as well as the posterior limb of the right external capsule.  No evidence of intracranial hemorrhage or mass effect identified on preceding MRI or CT head. MRA showed no LVO. Neurology was previously consulted and recommended dual anti-platelet therapy. Will pursue updated CT head to ensure no interval hemorrhagic conversion.     Newton Pigg, DO Hospitalist

## 2024-02-07 NOTE — Plan of Care (Signed)
  Problem: Ischemic Stroke/TIA Tissue Perfusion: Goal: Complications of ischemic stroke/TIA will be minimized 02/07/2024 1610 by [place], RN Outcome: Progressing 02/07/2024 0501 by [place], RN Outcome: Progressing   Problem: Coping: Goal: Will verbalize positive feelings about self 02/07/2024 0605 by [place], RN Outcome: Progressing 02/07/2024 0501 by [place], RN Outcome: Progressing Goal: Will identify appropriate support needs 02/07/2024 0605 by [place], RN Outcome: Progressing 02/07/2024 0501 by [place], RN Outcome: Progressing   Problem: Clinical Measurements: Goal: Ability to maintain clinical measurements within normal limits will improve 02/07/2024 0605 by [place], RN Outcome: Progressing 02/07/2024 0501 by [place], RN Outcome: Progressing Goal: Will remain free from infection 02/07/2024 0605 by [place], RN Outcome: Progressing 02/07/2024 0501 by [place], RN Outcome: Progressing Goal: Diagnostic test results will improve 02/07/2024 0605 by [place], RN Outcome: Progressing 02/07/2024 0501 by [place], RN Outcome: Progressing Goal: Respiratory complications will improve 02/07/2024 0605 by [place], RN Outcome: Progressing 02/07/2024 0501 by [place], RN Outcome: Progressing Goal: Cardiovascular complication will be avoided 02/07/2024 9604 by [place], RN Outcome: Progressing 02/07/2024 0501 by [place], RN Outcome: Progressing

## 2024-02-08 DIAGNOSIS — I639 Cerebral infarction, unspecified: Secondary | ICD-10-CM | POA: Diagnosis not present

## 2024-02-08 MED ORDER — NYSTATIN 100000 UNIT/ML MT SUSP
5.0000 mL | Freq: Four times a day (QID) | OROMUCOSAL | Status: DC
  Administered 2024-02-08 – 2024-02-09 (×5): 500000 [IU] via ORAL
  Filled 2024-02-08 (×4): qty 5

## 2024-02-08 MED ORDER — SENNOSIDES-DOCUSATE SODIUM 8.6-50 MG PO TABS
1.0000 | ORAL_TABLET | Freq: Two times a day (BID) | ORAL | Status: DC
  Administered 2024-02-08 – 2024-02-09 (×3): 1 via ORAL
  Filled 2024-02-08 (×3): qty 1

## 2024-02-08 NOTE — Plan of Care (Signed)
  Problem: Education: Goal: Knowledge of disease or condition will improve Outcome: Progressing   Problem: Coping: Goal: Will verbalize positive feelings about self Outcome: Progressing Goal: Will identify appropriate support needs Outcome: Progressing   Problem: Nutrition: Goal: Risk of aspiration will decrease Outcome: Progressing Goal: Dietary intake will improve Outcome: Progressing   Problem: Elimination: Goal: Will not experience complications related to bowel motility Outcome: Progressing

## 2024-02-08 NOTE — Plan of Care (Signed)
  Problem: Ischemic Stroke/TIA Tissue Perfusion: Goal: Complications of ischemic stroke/TIA will be minimized 02/08/2024 0755 by [place], RN Outcome: Progressing 02/08/2024 0508 by [place], RN Outcome: Progressing   Problem: Clinical Measurements: Goal: Ability to maintain clinical measurements within normal limits will improve 02/08/2024 0755 by [place], RN Outcome: Progressing 02/08/2024 0508 by [place], RN Outcome: Progressing Goal: Will remain free from infection 02/08/2024 0755 by [place], RN Outcome: Progressing 02/08/2024 0508 by [place], RN Outcome: Progressing Goal: Diagnostic test results will improve 02/08/2024 0755 by [place], RN Outcome: Progressing 02/08/2024 0508 by [place], RN Outcome: Progressing Goal: Respiratory complications will improve 02/08/2024 0755 by [place], RN Outcome: Progressing 02/08/2024 0508 by [place], RN Outcome: Progressing Goal: Cardiovascular complication will be avoided 02/08/2024 0755 by [place], RN Outcome: Progressing 02/08/2024 0508 by [place], RN Outcome: Progressing

## 2024-02-08 NOTE — Plan of Care (Signed)
  Problem: Self-Care: Goal: Ability to participate in self-care as condition permits will improve Outcome: Progressing Goal: Verbalization of feelings and concerns over difficulty with self-care will improve Outcome: Progressing Goal: Ability to communicate needs accurately will improve Outcome: Progressing   Problem: Health Behavior/Discharge Planning: Goal: Ability to manage health-related needs will improve Outcome: Progressing Goal: Goals will be collaboratively established with patient/family Outcome: Progressing   Problem: Clinical Measurements: Goal: Ability to maintain clinical measurements within normal limits will improve Outcome: Progressing Goal: Will remain free from infection Outcome: Progressing Goal: Diagnostic test results will improve Outcome: Progressing Goal: Respiratory complications will improve Outcome: Progressing Goal: Cardiovascular complication will be avoided Outcome: Progressing   Problem: Nutrition: Goal: Adequate nutrition will be maintained Outcome: Progressing   Problem: Safety: Goal: Ability to remain free from injury will improve Outcome: Progressing   Problem: Pain Managment: Goal: General experience of comfort will improve and/or be controlled Outcome: Progressing   Problem: Skin Integrity: Goal: Risk for impaired skin integrity will decrease Outcome: Progressing   Problem: Elimination: Goal: Will not experience complications related to bowel motility Outcome: Progressing Goal: Will not experience complications related to urinary retention Outcome: Progressing

## 2024-02-08 NOTE — Progress Notes (Signed)
 [facility]                                                                              Garrie Elenes, is a 58 y.o. female, DOB - 08/14/1966, UEA:[id number] Admit date - 02/02/2024    Outpatient Primary MD for the patient is Marcine Matar, MD  LOS - 4  days  Chief Complaint  Patient presents with   Aphasia       Brief summary   Patient is a 58 year old female with history of prior stroke, HTN, hyperlipidemia, hypothyroidism, cocaine abuse was found by patient's son that patient was feeling weak and has been having blurred vision when patient did not turn up for his performance and went to check on her at her house.  Also found that patient found it difficult to ambulate.  Patient was brought to the ER.  Last known well was unknown.  ED Course: In the ER patient was noticed to have slurred speech and left-sided weakness.   CT head showed stable patchy chronic small vessel disease in the periventricular white matter, bilateral lacunar infarcts.  Neurology was consulted and patient was admitted for further workup.  Cocaine positive in urine drug screen.   02/08/2024: Patient seen.  No new changes.  Awaiting disposition.   Assessment & Plan    Principal Problem:   Acute CVA (cerebrovascular accident) (HCC) -Presented with blurry vision, slurred speech and left-sided weakness -MRI brain showed several small acute to subacute lacunar infarct in the right lateral thalamus, posterior limb of the right external capsule, no associated hemorrhage or mass effect.  Underlying extensive chronic small vessel disease. -MRA head negative for large vessel occlusion, age advanced atherosclerosis, severe left PCA P2 segment stenosis new since 2023.  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  Active Problems: Tobacco, cocaine use disorder (HCC) -UDS positive for cocaine -Counseled to quit.   HLD (hyperlipidemia) -LDL 90, continue Crestor 40 mg daily   Essential hypertension -Hold antihypertensives, allow permissive hypertension   Hypothyroidism Continue Synthroid  Mild hypokalemia -Replaced  Estimated body mass index is 24.69 kg/m as calculated from the following:   Height as of this encounter: 5\' 5"  (1.651 m).   Weight as of this encounter: 67.3 kg.  Code Status: Full code DVT Prophylaxis:  enoxaparin (LOVENOX) injection 40 mg Start: 02/03/24 1000   Level of Care: Level of care: Telemetry Medical Family Communication: updated patient's mother on phone today Disposition Plan:      Remains inpatient appropriate:   Needs CIR   Procedures:  MRI, MRA brain 2D echo  Consultants:   Neurology  Antimicrobials:   Anti-infectives (From admission, onward)    None          Medications  aspirin EC  81 mg Oral Daily   clopidogrel  75 mg Oral Daily   enoxaparin (LOVENOX) injection  40 mg Subcutaneous Q24H   levothyroxine  100 mcg Oral Q0600   nystatin  5 mL Oral QID   polyethylene glycol  17 g Oral Daily   rosuvastatin  40 mg Oral Daily   senna-docusate  1 tablet Oral BID   sodium chloride flush  3 mL Intravenous Once      Subjective:  No new complaints.  Objective:   Vitals:   02/07/24 2309 02/08/24 0354 02/08/24 0953 02/08/24 1300  BP: 125/71 114/76 103/84 131/80  Pulse: (!) 59 70 65 72  Resp: 18 18 18 18   Temp: 98 F (36.7 C) 97.6 F (36.4 C) 98.5 F (36.9 C) 98.7 F (37.1 C)  TempSrc: Oral Oral Oral Oral  SpO2: 97% 97% 95% 95%  Weight:      Height:        No intake or output data in the 24 hours ending 02/08/24 1658    Wt Readings from Last 3 Encounters:  02/03/24 67.3 kg  04/17/23 68 kg  12/17/22 72.7 kg    Physical Exam General: Alert and oriented but with severe dysarthria, follows commands Cardiovascular: S1 S2 clear, RRR.  Respiratory: CTAB, no wheezing, rales or rhonchi Gastrointestinal: Soft, nontender, nondistended, NBS Ext: no pedal edema bilaterally Neuro: left-sided weakness Psych: appears to be alert and oriented however still has dysarthria    Data Reviewed:  I have personally reviewed following labs    CBC Lab Results  Component Value Date   WBC 5.6 02/04/2024   RBC 5.14 (H) 02/04/2024   HGB 14.6 02/04/2024   HCT 44.3 02/04/2024   MCV 86.2 02/04/2024   MCH 28.4 02/04/2024   PLT 170 02/04/2024   MCHC 33.0 02/04/2024   RDW 14.2 02/04/2024   LYMPHSABS 1.3 02/02/2024   MONOABS 0.4 02/02/2024   EOSABS 0.0 02/02/2024   BASOSABS 0.0 02/02/2024     Last metabolic panel Lab Results  Component Value Date   NA 140 02/04/2024   K 3.4 (L) 02/04/2024   CL 110 02/04/2024   CO2 24 02/04/2024   BUN 9 02/04/2024   CREATININE 0.61 02/04/2024   GLUCOSE 86 02/04/2024   GFRNONAA >60 02/04/2024   GFRAA 105 08/26/2019   CALCIUM 7.9 (L) 02/04/2024   PROT 6.6 02/03/2024   ALBUMIN 3.5 02/03/2024   LABGLOB 2.8 04/17/2023   AGRATIO 1.6 04/17/2023   BILITOT 0.3 02/03/2024   ALKPHOS 52 02/03/2024   AST 15 02/03/2024   ALT 10 02/03/2024   ANIONGAP 6 02/04/2024    CBG (last 3)  No results for input(s): "GLUCAP" in the last 72 hours.     Coagulation Profile: Recent Labs  Lab 02/02/24 2029  INR 1.0     Radiology Studies: I have personally reviewed the imaging studies  CT HEAD WO CONTRAST ( ) Result Date: 02/07/2024 CLINICAL DATA:  58 year old female with aphasia. Recent lacunar infarcts in the right lateral thalamus, internal capsule. EXAM: CT HEAD WITHOUT CONTRAST TECHNIQUE: Contiguous axial images  were obtained from the base of the skull through the vertex without intravenous contrast. RADIATION DOSE REDUCTION: This exam was performed according to the departmental dose-optimization program which includes automated exposure control, adjustment of the mA and/or kV according to patient size and/or use of iterative reconstruction technique. COMPARISON:  Brain MRI 02/03/2024 and head CT 02/02/2024. FINDINGS: Brain: Small recent lacunar infarcts now apparent on CT series 3, image 14.  No hemorrhagic transformation or mass effect. Chronic bilateral basal ganglia, left corona radiata lacunar infarcts appear stable. Stable cerebral volume. No midline shift, mass effect, or evidence of intracranial mass lesion. No ventriculomegaly. No acute intracranial hemorrhage identified. No cortically based acute infarct identified. Vascular: No suspicious intracranial vascular hyperdensity. Skull: Intact, stable. Sinuses/Orbits: Visualized paranasal sinuses and mastoids are stable and well aerated. Other: No acute orbit or scalp soft tissue finding. IMPRESSION: 1. Expected CT appearance of recent right thalamic region lacunar infarcts. No hemorrhagic transformation or mass effect. 2. Underlying chronic small vessel disease. No new acute intracranial abnormality. Electronically Signed   By: Odessa Fleming M.D.   On: 02/07/2024 12:12     Time spent: 25 minutes.   Barnetta Chapel M.D. [facility] 02/08/2024, 4:58 PM  Available via Epic secure chat 7am-7pm After 7 pm, please refer to night coverage provider listed on amion.

## 2024-02-09 ENCOUNTER — Inpatient Hospital Stay (HOSPITAL_COMMUNITY)
Admission: AD | Admit: 2024-02-09 | Discharge: 2024-04-28 | DRG: 057 | Disposition: A | Discharge status: Skilled Nursing Facility | Source: Intra-hospital | Attending: Physical Medicine and Rehabilitation | Admitting: Physical Medicine and Rehabilitation

## 2024-02-09 ENCOUNTER — Other Ambulatory Visit (HOSPITAL_COMMUNITY): Payer: Self-pay

## 2024-02-09 DIAGNOSIS — E785 Hyperlipidemia, unspecified: Secondary | ICD-10-CM | POA: Diagnosis present

## 2024-02-09 DIAGNOSIS — R448 Other symptoms and signs involving general sensations and perceptions: Secondary | ICD-10-CM

## 2024-02-09 DIAGNOSIS — F1410 Cocaine abuse, uncomplicated: Secondary | ICD-10-CM | POA: Diagnosis present

## 2024-02-09 DIAGNOSIS — F32A Depression, unspecified: Secondary | ICD-10-CM | POA: Diagnosis present

## 2024-02-09 DIAGNOSIS — E890 Postprocedural hypothyroidism: Secondary | ICD-10-CM

## 2024-02-09 DIAGNOSIS — M25532 Pain in left wrist: Secondary | ICD-10-CM | POA: Diagnosis not present

## 2024-02-09 DIAGNOSIS — M79642 Pain in left hand: Secondary | ICD-10-CM | POA: Diagnosis not present

## 2024-02-09 DIAGNOSIS — R1312 Dysphagia, oropharyngeal phase: Secondary | ICD-10-CM | POA: Diagnosis not present

## 2024-02-09 DIAGNOSIS — F17210 Nicotine dependence, cigarettes, uncomplicated: Secondary | ICD-10-CM | POA: Diagnosis present

## 2024-02-09 DIAGNOSIS — R159 Full incontinence of feces: Secondary | ICD-10-CM | POA: Diagnosis not present

## 2024-02-09 DIAGNOSIS — I639 Cerebral infarction, unspecified: Secondary | ICD-10-CM

## 2024-02-09 DIAGNOSIS — G8114 Spastic hemiplegia affecting left nondominant side: Secondary | ICD-10-CM | POA: Diagnosis not present

## 2024-02-09 DIAGNOSIS — B3731 Acute candidiasis of vulva and vagina: Secondary | ICD-10-CM | POA: Diagnosis not present

## 2024-02-09 DIAGNOSIS — I69322 Dysarthria following cerebral infarction: Secondary | ICD-10-CM

## 2024-02-09 DIAGNOSIS — H538 Other visual disturbances: Secondary | ICD-10-CM | POA: Diagnosis present

## 2024-02-09 DIAGNOSIS — R509 Fever, unspecified: Secondary | ICD-10-CM | POA: Diagnosis not present

## 2024-02-09 DIAGNOSIS — Z8673 Personal history of transient ischemic attack (TIA), and cerebral infarction without residual deficits: Secondary | ICD-10-CM

## 2024-02-09 DIAGNOSIS — M778 Other enthesopathies, not elsewhere classified: Secondary | ICD-10-CM | POA: Diagnosis not present

## 2024-02-09 DIAGNOSIS — R519 Headache, unspecified: Secondary | ICD-10-CM | POA: Diagnosis not present

## 2024-02-09 DIAGNOSIS — Z85850 Personal history of malignant neoplasm of thyroid: Secondary | ICD-10-CM

## 2024-02-09 DIAGNOSIS — I63511 Cerebral infarction due to unspecified occlusion or stenosis of right middle cerebral artery: Principal | ICD-10-CM | POA: Diagnosis present

## 2024-02-09 DIAGNOSIS — Z79899 Other long term (current) drug therapy: Secondary | ICD-10-CM | POA: Diagnosis not present

## 2024-02-09 DIAGNOSIS — I69320 Aphasia following cerebral infarction: Secondary | ICD-10-CM

## 2024-02-09 DIAGNOSIS — R1310 Dysphagia, unspecified: Secondary | ICD-10-CM | POA: Diagnosis present

## 2024-02-09 DIAGNOSIS — F1910 Other psychoactive substance abuse, uncomplicated: Secondary | ICD-10-CM | POA: Diagnosis not present

## 2024-02-09 DIAGNOSIS — N3946 Mixed incontinence: Secondary | ICD-10-CM | POA: Diagnosis not present

## 2024-02-09 DIAGNOSIS — R252 Cramp and spasm: Secondary | ICD-10-CM | POA: Diagnosis not present

## 2024-02-09 DIAGNOSIS — G43909 Migraine, unspecified, not intractable, without status migrainosus: Secondary | ICD-10-CM | POA: Diagnosis not present

## 2024-02-09 DIAGNOSIS — Z8249 Family history of ischemic heart disease and other diseases of the circulatory system: Secondary | ICD-10-CM

## 2024-02-09 DIAGNOSIS — M109 Gout, unspecified: Secondary | ICD-10-CM | POA: Diagnosis present

## 2024-02-09 DIAGNOSIS — M549 Dorsalgia, unspecified: Secondary | ICD-10-CM | POA: Diagnosis not present

## 2024-02-09 DIAGNOSIS — D751 Secondary polycythemia: Secondary | ICD-10-CM | POA: Diagnosis not present

## 2024-02-09 DIAGNOSIS — F329 Major depressive disorder, single episode, unspecified: Secondary | ICD-10-CM | POA: Diagnosis not present

## 2024-02-09 DIAGNOSIS — M1612 Unilateral primary osteoarthritis, left hip: Secondary | ICD-10-CM | POA: Diagnosis present

## 2024-02-09 DIAGNOSIS — H5500 Unspecified nystagmus: Secondary | ICD-10-CM | POA: Diagnosis not present

## 2024-02-09 DIAGNOSIS — G441 Vascular headache, not elsewhere classified: Secondary | ICD-10-CM | POA: Diagnosis not present

## 2024-02-09 DIAGNOSIS — M19032 Primary osteoarthritis, left wrist: Secondary | ICD-10-CM | POA: Diagnosis present

## 2024-02-09 DIAGNOSIS — Z7982 Long term (current) use of aspirin: Secondary | ICD-10-CM | POA: Diagnosis not present

## 2024-02-09 DIAGNOSIS — R609 Edema, unspecified: Secondary | ICD-10-CM | POA: Diagnosis not present

## 2024-02-09 DIAGNOSIS — K5900 Constipation, unspecified: Secondary | ICD-10-CM | POA: Diagnosis not present

## 2024-02-09 DIAGNOSIS — I69354 Hemiplegia and hemiparesis following cerebral infarction affecting left non-dominant side: Secondary | ICD-10-CM | POA: Diagnosis not present

## 2024-02-09 DIAGNOSIS — R4587 Impulsiveness: Secondary | ICD-10-CM | POA: Diagnosis present

## 2024-02-09 DIAGNOSIS — M25552 Pain in left hip: Secondary | ICD-10-CM | POA: Diagnosis not present

## 2024-02-09 DIAGNOSIS — M7989 Other specified soft tissue disorders: Secondary | ICD-10-CM | POA: Diagnosis not present

## 2024-02-09 DIAGNOSIS — Z7902 Long term (current) use of antithrombotics/antiplatelets: Secondary | ICD-10-CM | POA: Diagnosis not present

## 2024-02-09 DIAGNOSIS — R32 Unspecified urinary incontinence: Secondary | ICD-10-CM | POA: Diagnosis not present

## 2024-02-09 DIAGNOSIS — Z79890 Hormone replacement therapy (postmenopausal): Secondary | ICD-10-CM | POA: Diagnosis not present

## 2024-02-09 DIAGNOSIS — M19041 Primary osteoarthritis, right hand: Secondary | ICD-10-CM | POA: Diagnosis not present

## 2024-02-09 DIAGNOSIS — Z91148 Patient's other noncompliance with medication regimen for other reason: Secondary | ICD-10-CM

## 2024-02-09 DIAGNOSIS — I10 Essential (primary) hypertension: Secondary | ICD-10-CM | POA: Diagnosis not present

## 2024-02-09 DIAGNOSIS — M1812 Unilateral primary osteoarthritis of first carpometacarpal joint, left hand: Secondary | ICD-10-CM | POA: Diagnosis not present

## 2024-02-09 DIAGNOSIS — I69391 Dysphagia following cerebral infarction: Secondary | ICD-10-CM | POA: Diagnosis not present

## 2024-02-09 DIAGNOSIS — R471 Dysarthria and anarthria: Secondary | ICD-10-CM | POA: Diagnosis not present

## 2024-02-09 DIAGNOSIS — R9082 White matter disease, unspecified: Secondary | ICD-10-CM | POA: Diagnosis not present

## 2024-02-09 DIAGNOSIS — N3289 Other specified disorders of bladder: Secondary | ICD-10-CM | POA: Diagnosis not present

## 2024-02-09 DIAGNOSIS — Z833 Family history of diabetes mellitus: Secondary | ICD-10-CM

## 2024-02-09 DIAGNOSIS — R4701 Aphasia: Secondary | ICD-10-CM | POA: Diagnosis not present

## 2024-02-09 DIAGNOSIS — R110 Nausea: Secondary | ICD-10-CM | POA: Diagnosis not present

## 2024-02-09 DIAGNOSIS — M792 Neuralgia and neuritis, unspecified: Secondary | ICD-10-CM | POA: Diagnosis not present

## 2024-02-09 DIAGNOSIS — R4182 Altered mental status, unspecified: Secondary | ICD-10-CM | POA: Diagnosis not present

## 2024-02-09 MED ORDER — ACETAMINOPHEN 160 MG/5ML PO SOLN: 650.0000 mg | ORAL | Status: DC | PRN

## 2024-02-09 MED ORDER — CLOPIDOGREL BISULFATE 75 MG PO TABS
75.0000 mg | ORAL_TABLET | Freq: Every day | ORAL | Status: DC
  Administered 2024-02-10 – 2024-04-19 (×70): 75 mg via ORAL
  Filled 2024-02-09 (×72): qty 1

## 2024-02-09 MED ORDER — POLYETHYLENE GLYCOL 3350 17 G PO PACK
17.0000 g | PACK | Freq: Every day | ORAL | Status: DC
  Administered 2024-02-10 – 2024-02-26 (×12): 17 g via ORAL
  Filled 2024-02-09 (×19): qty 1

## 2024-02-09 MED ORDER — POLYETHYLENE GLYCOL 3350 17 G PO PACK
17.0000 g | PACK | Freq: Once | ORAL | Status: AC
  Administered 2024-02-09: 17 g via ORAL
  Filled 2024-02-09: qty 1

## 2024-02-09 MED ORDER — ACETAMINOPHEN 325 MG PO TABS: 650.0000 mg | ORAL_TABLET | ORAL | Status: DC | PRN

## 2024-02-09 MED ORDER — LEVOTHYROXINE SODIUM 100 MCG PO TABS
100.0000 ug | ORAL_TABLET | Freq: Every day | ORAL | Status: DC
  Administered 2024-02-10 – 2024-04-28 (×79): 100 ug via ORAL
  Filled 2024-02-09 (×79): qty 1

## 2024-02-09 MED ORDER — SENNOSIDES-DOCUSATE SODIUM 8.6-50 MG PO TABS: 1.0000 | ORAL_TABLET | Freq: Two times a day (BID) | ORAL | Status: DC

## 2024-02-09 MED ORDER — SENNOSIDES-DOCUSATE SODIUM 8.6-50 MG PO TABS
1.0000 | ORAL_TABLET | Freq: Two times a day (BID) | ORAL | Status: DC
  Administered 2024-02-09 – 2024-02-11 (×5): 1 via ORAL
  Filled 2024-02-09 (×5): qty 1

## 2024-02-09 MED ORDER — ACETAMINOPHEN 650 MG RE SUPP: 650.0000 mg | RECTAL | Status: DC | PRN

## 2024-02-09 MED ORDER — ORAL CARE MOUTH RINSE: 15.0000 mL | OROMUCOSAL | Status: DC | PRN

## 2024-02-09 MED ORDER — ASPIRIN 81 MG PO TBEC
81.0000 mg | DELAYED_RELEASE_TABLET | Freq: Every day | ORAL | 0 refills | Status: DC
  Filled 2024-02-09: qty 21, 21d supply, fill #0

## 2024-02-09 MED ORDER — ACETAMINOPHEN 325 MG PO TABS
650.0000 mg | ORAL_TABLET | ORAL | Status: DC | PRN
  Administered 2024-02-09 – 2024-04-26 (×34): 650 mg via ORAL
  Filled 2024-02-09 (×44): qty 2

## 2024-02-09 MED ORDER — ENOXAPARIN SODIUM 40 MG/0.4ML IJ SOSY
40.0000 mg | PREFILLED_SYRINGE | INTRAMUSCULAR | Status: DC
  Administered 2024-02-10 – 2024-04-20 (×71): 40 mg via SUBCUTANEOUS
  Filled 2024-02-09 (×72): qty 0.4

## 2024-02-09 MED ORDER — BISACODYL 10 MG RE SUPP
10.0000 mg | Freq: Every day | RECTAL | Status: DC | PRN
  Administered 2024-02-09: 10 mg via RECTAL
  Filled 2024-02-09: qty 1

## 2024-02-09 MED ORDER — NYSTATIN 100000 UNIT/ML MT SUSP
5.0000 mL | Freq: Four times a day (QID) | OROMUCOSAL | Status: DC
  Administered 2024-02-09 – 2024-02-28 (×32): 500000 [IU] via ORAL
  Filled 2024-02-09 (×51): qty 5

## 2024-02-09 MED ORDER — ENOXAPARIN SODIUM 40 MG/0.4ML IJ SOSY: 40.0000 mg | PREFILLED_SYRINGE | INTRAMUSCULAR | Status: DC

## 2024-02-09 MED ORDER — ASPIRIN 81 MG PO TBEC
81.0000 mg | DELAYED_RELEASE_TABLET | Freq: Every day | ORAL | Status: DC
  Administered 2024-02-10 – 2024-02-28 (×19): 81 mg via ORAL
  Filled 2024-02-09 (×19): qty 1

## 2024-02-09 MED ORDER — POLYETHYLENE GLYCOL 3350 17 GM/SCOOP PO POWD
17.0000 g | Freq: Every day | ORAL | 0 refills | Status: DC
  Filled 2024-02-09: qty 238, 14d supply, fill #0

## 2024-02-09 MED ORDER — NYSTATIN 100000 UNIT/ML MT SUSP
5.0000 mL | Freq: Four times a day (QID) | OROMUCOSAL | 0 refills | Status: DC
  Filled 2024-02-09: qty 60, 3d supply, fill #0

## 2024-02-09 MED ORDER — FLEET ENEMA RE ENEM
1.0000 | ENEMA | Freq: Every day | RECTAL | Status: DC | PRN
  Filled 2024-02-09: qty 1

## 2024-02-09 MED ORDER — ROSUVASTATIN CALCIUM 20 MG PO TABS
40.0000 mg | ORAL_TABLET | Freq: Every day | ORAL | Status: DC
Start: 2024-02-10 — End: 2024-04-28
  Administered 2024-02-10 – 2024-04-28 (×78): 40 mg via ORAL
  Filled 2024-02-09 (×79): qty 2

## 2024-02-09 NOTE — TOC Transition Note (Signed)
 Transition of Care [facility]) - Discharge Note   Patient Details  Name: Theresa Watkins MRN: [id number] Date of Birth: 25-Jan-1966  [facility] CM/SW Contact:  Kermit Balo, RN Phone Number: 02/09/2024, 1:30 PM   Clinical Narrative:     Pt is discharging to CIR today. TOC signing off.   Final next level of care: IP Rehab Facility Barriers to Discharge: No Barriers Identified   Patient Goals and CMS Choice   CMS Medicare.gov Compare Post Acute Care list provided to:: Patient Choice offered to / list presented to : Patient      Discharge Placement                       Discharge Plan and Services Additional resources added to the After Visit Summary for                                       Social Drivers of Health (SDOH) Interventions SDOH Screenings   Food Insecurity: Food Insecurity Present (02/03/2024)  Housing: High Risk (02/03/2024)  Transportation Needs: Unmet Transportation Needs (02/03/2024)  Utilities: At Risk (02/03/2024)  Depression (PHQ2-9): Medium Risk (09/19/2022)  Tobacco Use: High Risk (02/03/2024)     Readmission Risk Interventions     No data to display

## 2024-02-09 NOTE — Progress Notes (Signed)
 PMR Admission Coordinator Pre-Admission Assessment   Patient: Theresa Watkins is an 58 y.o., female MRN: [id number] DOB: 03/16/1966 Height: 5\' 5"  (165.1 cm) Weight: 67.3 kg                                                                                                                                                  Insurance Information HMO:     PPO:      PCP:      IPA:      80/20:      OTHER:  PRIMARY: [place] Medicaid Healthy Blue      Policy#: [id number]       Subscriber: pt CM Name: n/a    Phone#:  Fax#: [phone] Received approval via fax on 3/24 for admit 3/87-5/[birth year] Pre-Cert#: [id number]      Employer:  Benefits:  Phone #: [phone]     Name:  Eff. Date: 02/09/2024     Deduct: $0      Out of Pocket Max: $0      Life Max:   CIR: 100%      SNF:  Outpatient:      Co-Pay:  Home Health:       Co-Pay:  DME:      Co-Pay:  Providers:  SECONDARY:       Policy#:       Phone#:    Artist:       Phone#:    The Data processing manager" for patients in Inpatient Rehabilitation Facilities with attached "Privacy Act Statement-Health Care Records" was provided and verbally reviewed with: Patient and Family   Emergency Contact Information Contact Information       Name Relation Home Work Mobile    [place] Mother [phone]   [phone]         Other Contacts       Name Relation Home Work [place] Son     [phone]         Current Medical History  Patient Admitting Diagnosis: CVA    History of Present Illness: Theresa Watkins is a 58 year old right-handed female with history of hypertension, hyperlipidemia, hypothyroidism, cocaine/polysubstance abuse as well as tobacco use.  Per chart review patient lives alone.  Reportedly used a straight point cane prior to admission.  She does have family in the area.  Presented 02/02/2024 with aphasia as well as left-sided weakness after being found down by her son.  CT/MRI showed several  small acute to subacute lacunar infarcts in the lateral right thalamus, posterior limb right external capsule.  No associated hemorrhage or mass effect.  Underlying extensive chronic small vessel disease.  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  MRA negative for large vessel occlusion.  New since 2023 severe left  PCA P2 segment stenosis.  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  Patient did not receive TNK.  Admission chemistries unremarkable except potassium 3.4, urine drug screen positive cocaine, hemoglobin A1c 5.8.  Echocardiogram ejection fraction of 60 to 65% no wall motion abnormalities.  Presently maintained on aspirin 81 mg daily and Plavix 75 mg daily for CVA prophylaxis x 3 weeks then Plavix alone.  Lovenox added for DVT prophylaxis.  Presently on dysphagia #2 thin liquid diet.  Therapy evaluations completed due to patient decreased functional mobility and left-sided weakness was admitted for a comprehensive rehab program.    Complete NIHSS TOTAL: 7 Glasgow Coma Scale Score: 15   Patient's medical record from [facility] has been reviewed by the rehabilitation admission coordinator and physician.   Past Medical History      Past Medical History:  Diagnosis Date   Abnormal Pap smear     Bronchitis     Fibroid     Headache(784.0)     Ovarian cyst     Plantar fasciitis     Seizures (HCC)     Stroke [facility])     Thyroid cancer (HCC) 20011   Trichomonas     Urinary tract infection            Has the patient had major surgery during 100 days prior to admission? No   Family History  family history includes Bell's palsy in her mother; Breast cancer in her cousin; Colon cancer in her maternal aunt and paternal aunt; Diabetes in her mother and sister; Diverticulitis in her sister; Hypertension in her mother and sister; Prostate cancer in her father; Stroke in her  father.     Current Medications   Current Medications    Current Facility-Administered Medications:    acetaminophen (TYLENOL) tablet 650 mg, 650 mg, Oral, Q4H PRN, 650 mg at 02/05/24 1147 **OR** acetaminophen (TYLENOL) 160 MG/5ML solution 650 mg, 650 mg, Per Tube, Q4H PRN **OR** acetaminophen (TYLENOL) suppository 650 mg, 650 mg, Rectal, Q4H PRN, Eduard Clos, MD   [COMPLETED] aspirin EC tablet 325 mg, 325 mg, Oral, Once, 325 mg at 02/03/24 1346 **FOLLOWED BY** aspirin EC tablet 81 mg, 81 mg, Oral, Daily, Marvel Plan, MD, 81 mg at 02/06/24 0841   clopidogrel (PLAVIX) tablet 75 mg, 75 mg, Oral, Daily, Marvel Plan, MD, 75 mg at 02/06/24 0841   enoxaparin (LOVENOX) injection 40 mg, 40 mg, Subcutaneous, Q24H, Eduard Clos, MD, 40 mg at 02/06/24 1610   levothyroxine (SYNTHROID) tablet 100 mcg, 100 mcg, Oral, Q0600, Rai, Ripudeep K, MD, 100 mcg at 02/06/24 9604   Oral care mouth rinse, 15 mL, Mouth Rinse, PRN, Rai, Ripudeep K, MD   rosuvastatin (CRESTOR) tablet 40 mg, 40 mg, Oral, Daily, Marvel Plan, MD, 40 mg at 02/06/24 0840   sodium chloride flush (NS) 0.9 % injection 3 mL, 3 mL, Intravenous, Once, Prosperi, Christian H, PA-C     Patients Current Diet:  Diet Order                  DIET DYS 2 Room service appropriate? Yes; Fluid consistency: Thin  Diet effective now                         Precautions / Restrictions Precautions Precautions: Fall Restrictions Weight Bearing Restrictions Per Provider Order: No    Has the patient had 2 or more falls or a fall with injury in the past  year?No   Prior Activity Level Community (5-7x/wk): independent, occasionally used RW or SPC, mom takes to appointments, pt lived independently   Prior Functional Level Prior Function Prior Level of Function : Patient poor historian/Family not available (Information from chart review) Mobility Comments: pt reports ambulating with use of SPC. Pt is often difficult to understand  throughout session due to dysarthria and expressive aphasia.   Self Care: Did the patient need help bathing, dressing, using the toilet or eating?  Independent   Indoor Mobility: Did the patient need assistance with walking from room to room (with or without device)? Independent   Stairs: Did the patient need assistance with internal or external stairs (with or without device)? Independent   Functional Cognition: Did the patient need help planning regular tasks such as shopping or remembering to take medications? Independent   Patient Information Are you of Hispanic, Latino/a,or Spanish origin?: A. No, not of Hispanic, Latino/a, or Spanish origin What is your race?: B. Black or African American Do you need or want an interpreter to communicate with a doctor or health care staff?: 0. No   Patient's Response To:  Health Literacy and Transportation Is the patient able to respond to health literacy and transportation needs?: Yes Health Literacy - How often do you need to have someone help you when you read instructions, pamphlets, or other written material from your doctor or pharmacy?: Never In the past 12 months, has lack of transportation kept you from medical appointments or from getting medications?: No In the past 12 months, has lack of transportation kept you from meetings, work, or from getting things needed for daily living?: No   Home Assistive Devices / Equipment Home Equipment: Gilmer Mor - single point   Prior Device Use: Indicate devices/aids used by the patient prior to current illness, exacerbation or injury? Walker and cane PRN   Current Functional Level Cognition   Arousal/Alertness: Awake/alert Overall Cognitive Status: No family/caregiver present to determine baseline cognitive functioning Orientation Level: Oriented X4 Attention: Sustained Sustained Attention: Appears intact Memory: Impaired Memory Impairment: Decreased recall of new information Awareness:  Impaired Awareness Impairment: Emergent impairment    Extremity Assessment (includes Sensation/Coordination)   Upper Extremity Assessment: Right hand dominant, LUE deficits/detail RUE Deficits / Details: grossly 4/5, ROM WFL LUE Deficits / Details: grossly 4-/5, ROM WFL LUE:  (moving out of synergy pattern; able to touch top of head in supine - difficulty against gravity in sitting; gross grasp/release; poor in-hand manipulation skills; attempting to use as a gross assist) LUE Coordination: decreased fine motor, decreased gross motor  Lower Extremity Assessment: Defer to PT evaluation LLE Deficits / Details: grossly 4-/5     ADLs   Overall ADL's : Needs assistance/impaired Eating/Feeding:  (pt using LUE to assist with positioning of cup) Grooming: Moderate assistance, Sitting Upper Body Bathing: Moderate assistance, Sitting Lower Body Bathing: Maximal assistance, Bed level Upper Body Dressing : Maximal assistance, Sitting Lower Body Dressing: Maximal assistance, Bed level Toilet Transfer: Moderate assistance, Squat-pivot Toileting- Clothing Manipulation and Hygiene: Sit to/from stand, Moderate assistance (pt able to assist with donning/doffing briefs on L side using LUE.) Functional mobility during ADLs: Moderate assistance (stand pivot)     Mobility   Overal bed mobility: Needs Assistance Bed Mobility: Rolling, Sidelying to Sit, Sit to Sidelying Rolling: Mod assist Sidelying to sit: Max assist Supine to sit: Mod assist, Used rails Sit to supine: Contact guard assist, Used rails Sit to sidelying: Min assist General bed mobility comments: Pt with very poor  sitting posture, needing Min to max A to maintain sitting balance. Posterior lean.     Transfers   Overall transfer level: Needs assistance Equipment used: None Transfers: Sit to/from Stand, Bed to chair/wheelchair/BSC Sit to Stand: Mod assist Bed to/from chair/wheelchair/BSC transfer type:: Stand pivot Stand pivot  transfers: Mod assist General transfer comment: Pt needing Mod A for pivots and to assist with weight shift to offload BLEs     Ambulation / Gait / Stairs / Wheelchair Mobility   Ambulation/Gait Pre-gait activities: Pt performed marching in place and side stepping with poor floor clearance on the L initially that improved with repetitions and Mod A wgt shifting at EOB with RW. Intermittent need for tactile/verbal cues for correct hand placement on RW.     Posture / Balance Dynamic Sitting Balance Sitting balance - Comments: Min to Max A with posterior lean Balance Overall balance assessment: Needs assistance Sitting-balance support: Feet supported, Single extremity supported Sitting balance-Leahy Scale: Poor Sitting balance - Comments: Min to Max A with posterior lean Postural control: Posterior lean Standing balance support: Single extremity supported, Reliant on assistive device for balance Standing balance-Leahy Scale: Poor Standing balance comment: MOd A during marching in Place and MIn A for static standing with RW.     Special needs/care consideration N/a        Previous Home Environment (from acute therapy documentation) Living Arrangements: Alone  Lives With: Alone Available Help at Discharge: Family, Available PRN/intermittently Type of Home: Apartment Home Layout: One level Home Access: Stairs to enter Entrance Stairs-Rails: None Entrance Stairs-Number of Steps: 3 Bathroom Shower/Tub: Engineer, manufacturing systems: Standard Bathroom Accessibility: Yes How Accessible: Accessible via walker Home Care Services: No   Discharge Living Setting Plans for Discharge Living Setting: Lives with (comment) (mom's townhome) Type of Home at Discharge: Other (Comment) (townhome) Discharge Home Layout: Two level, 1/2 bath on main level Discharge Home Access: Stairs to enter Entrance Stairs-Rails: Right, Left Entrance Stairs-Number of Steps: 1+2 Discharge Bathroom Shower/Tub:  Tub/shower unit Discharge Bathroom Toilet: Standard Discharge Bathroom Accessibility: No Does the patient have any problems obtaining your medications?: Yes (Describe)   Social/Family/Support Systems Anticipated Caregiver: mom, Shyla Gayheart Anticipated Industrial/product designer Information: [phone] (home number is best contact**) Ability/Limitations of Caregiver: min assist at the most Caregiver Availability: 24/7 Discharge Plan Discussed with Primary Caregiver: Yes Is Caregiver In Agreement with Plan?: Yes Does Caregiver/Family have Issues with Lodging/Transportation while Pt is in Rehab?: No     Goals Patient/Family Goal for Rehab: PT/OT supervision to mod I, SLP supervision to min assist Expected length of stay: 14-16 days Additional Information: Discharge plan: pt will discharge to her mom's townhome where she will have 24/7 supervision/min assist Pt/Family Agrees to Admission and willing to participate: Yes Program Orientation Provided & Reviewed with Pt/Caregiver Including Roles  & Responsibilities: Yes     Decrease burden of Care through IP rehab admission: n/a     Possible need for SNF placement upon discharge: Not anticipated.  Plan for discharge to pt's mother's townhome where mom can provide 24/7 assist.       Patient Condition: This patient's condition remains as documented in the consult dated 02/04/24, in which the Rehabilitation Physician determined and documented that the patient's condition is appropriate for intensive rehabilitative care in an inpatient rehabilitation facility. Will admit to inpatient rehab today.   Preadmission Screen Completed By:  Stephania Fragmin, PT, DPT 02/06/2024 10:13 AM ______________________________________________________________________   Discussed status with Dr. Wynn Banker on 02/09/24 at 900 and  received approval for admission today.   Admission Coordinator:  Stephania Fragmin, time1319/Date3/24/25  with updates by Megan Salon, MS,  CCC-SLP

## 2024-02-09 NOTE — Progress Notes (Signed)
 Occupational Therapy Treatment Patient Details Name: Theresa Watkins MRN: [id number] DOB: 10-17-1966 Today's Date: 02/09/2024   History of present illness 58 y.o. female presents to [facility] hospital on 02/02/2024 with weakness and blurred vision. MRI shows infarcts in the R thalamus and R posterior limb of external capsule. PMH includes CVA, HTN, HLD, hypothyroidism, cocaine abuse.   OT comments  Theresa Watkins is demonstrating excellent progress toward goals. Sitting balance and midline postural control is improving however pt continues to demonstrate L bias when distracted. Completed ADL task @ chair level with overall Mod A. Transfer using step pivot technique toward R with Mod A. Beginning to facilitate movement LUE in flexor synergy pattern, which pt is excited about. Pt works very hard, is very Theresa Watkins and is motivated to become more independent. Patient will benefit from intensive inpatient follow-up therapy, >3 hours/day to maximize functional level of independence to facilitate a safe DC home.       If plan is discharge home, recommend the following:  A lot of help with bathing/dressing/bathroom;A lot of help with walking and/or transfers;Assistance with cooking/housework;Direct supervision/assist for medications management;Direct supervision/assist for financial management;Assist for transportation;Help with stairs or ramp for entrance   Equipment Recommendations  None recommended by OT    Recommendations for Other Services Rehab consult    Precautions / Restrictions Precautions Precautions: Fall       Mobility Bed Mobility Overal bed mobility: Needs Assistance   Rolling: Supervision (toward L); VC for problem solving; use of rails   Supine to sit: Min assist to transition trunk upright          Transfers Overall transfer level: Needs assistance   Transfers: Sit to/from Stand, Bed to chair/wheelchair/BSC Sit to Stand: Mod assist Stand pivot transfers: Mod assist- toward  R               Balance     Sitting balance-Leahy Scale: Poor Sitting balance - Comments: maintains midline postural control EOB with occasional VC for correction; in chair leaning toward L - able to self correct with VC     Standing balance-Leahy Scale: Poor                             ADL either performed or assessed with clinical judgement   ADL Overall ADL's : Needs assistance/impaired Eating/Feeding: Minimal assistance;Supervision/ safety   Grooming: Minimal assistance;Sitting   Upper Body Bathing: Minimal assistance;Sitting   Lower Body Bathing: Moderate assistance;Sitting/lateral leans;Sit to/from stand   Upper Body Dressing : Moderate assistance;Sitting   Lower Body Dressing: Moderate assistance;Bed level Lower Body Dressing Details (indicate cue type and reason): able to use figure four and rolling to help with donning socks and pants     Toileting- Clothing Manipulation and Hygiene: Maximal assistance;Sit to/from stand       Functional mobility during ADLs: Moderate assistance (stand pivot toward R strong side)      Extremity/Trunk Assessment Upper Extremity Assessment LUE Deficits / Details: demonstrating flexor synergy pattern voluntarily- shoulder, elbow and hand; able to begain to release gross grasp LUE Coordination: decreased fine motor;decreased gross motor (attempting to use functionally to stabilize pen)  Educated regarding importance of keeping RUE supported; at risk of injury due to weakness and sensory deficits Lower Extremity Assessment Lower Extremity Assessment: Defer to PT evaluation        Vision   Vision Assessment?: Vision impaired- to be further tested in functional context;Wears glasses for reading Additional Comments:  c/o blurry vision; will continue to assess   Perception Perception Perception: Impaired Preception Impairment Details: Spatial orientation Perception-Other Comments: improved awareness; responds well  to verbal and tactile feedback   Praxis Praxis Praxis: [facility]   Communication Communication Communication: Impaired Factors Affecting Communication: Reduced clarity of speech   Cognition Arousal: Alert Behavior During Therapy: WFL for tasks assessed/performed Cognition: Cognition impaired Difficult to assess due to: Impaired communication   Awareness: Online awareness impaired Memory impairment (select all impairments): Working memory Attention impairment (select first level of impairment): Selective attention Executive functioning impairment (select all impairments): Problem solving, Reasoning OT - Cognition Comments: Demonstrating improvement                 Following commands: Intact        Cueing   Cueing Techniques: Verbal cues, Tactile cues  Exercises Exercises: Other exercises Other Exercises Other Exercises: Facilitation of LUE movement Other Exercises: yellow foam cube grasp/release Other Exercises: issued coloring and puzzle books    Shoulder Instructions       General Comments      Pertinent Vitals/ Pain       Pain Assessment Pain Assessment: Faces Faces Pain Scale: Hurts little more Pain Location: L hip Pain Descriptors / Indicators: Discomfort Pain Intervention(s): Limited activity within patient's tolerance  Home Living                                          Prior Functioning/Environment              Frequency  Min 2X/week        Progress Toward Goals  OT Goals(current goals can now be found in the care plan section)  Progress towards OT goals: Progressing toward goals  Acute Rehab OT Goals Patient Stated Goal: to get better OT Goal Formulation: With patient Time For Goal Achievement: 02/17/24 Potential to Achieve Goals: Good ADL Goals Pt Will Perform Eating: with set-up;with supervision;sitting Pt Will Perform Grooming: with set-up;with supervision;sitting Pt Will Perform Upper Body Bathing: with  set-up;sitting;with supervision Pt Will Perform Lower Body Bathing: with min assist;sit to/from stand Pt Will Transfer to Toilet: with min assist;bedside commode;squat pivot transfer Additional ADL Goal #1: Pt will use LUE as functional assist during ADL tasks  Plan      Co-evaluation                 AM-PAC OT "6 Clicks" Daily Activity     Outcome Measure   Help from another person eating meals?: A Little Help from another person taking care of personal grooming?: A Little Help from another person toileting, which includes using toliet, bedpan, or urinal?: A Lot Help from another person bathing (including washing, rinsing, drying)?: A Lot Help from another person to put on and taking off regular upper body clothing?: A Lot Help from another person to put on and taking off regular lower body clothing?: A Lot 6 Click Score: 14    End of Session Equipment Utilized During Treatment: Gait belt  OT Visit Diagnosis: Unsteadiness on feet (R26.81);Other abnormalities of gait and mobility (R26.89);Muscle weakness (generalized) (M62.81);Low vision, both eyes (H54.2);Cognitive communication deficit (R41.841);Hemiplegia and hemiparesis Symptoms and signs involving cognitive functions: Cerebral infarction Hemiplegia - Right/Left: Left Hemiplegia - dominant/non-dominant: Non-Dominant Hemiplegia - caused by: Cerebral infarction   Activity Tolerance Patient tolerated treatment well   Patient Left in chair;with call bell/phone within  reach;with chair alarm set   Watkins Communication Mobility status        Time: 1133-1220 OT Time Calculation (min): 47 min  Charges: OT General Charges $OT Visit: 1 Visit OT Treatments $Self Care/Home Management : 23-37 mins $Neuromuscular Re-education: 8-22 mins  Luisa Dago, OT/L   Acute OT Clinical Specialist Acute Rehabilitation Services Pager [phone] Office [phone]   [facility] 02/09/2024, 1:14 PM

## 2024-02-09 NOTE — H&P (Signed)
 Physical Medicine and Rehabilitation Admission H&P        Chief Complaint  Patient presents with   Aphasia  : HPI: Theresa Watkins is a 58 year old right-handed female with history of hypertension, hyperlipidemia, hypothyroidism, cocaine/polysubstance abuse as well as tobacco use.  Per chart review patient lives alone.  Reportedly used a straight point cane prior to admission.  She does have family in the area.  Presented 02/02/2024 with aphasia as well as left-sided weakness after being found down by her son.  CT/MRI showed several small acute to subacute lacunar infarcts in the lateral right thalamus, posterior limb right external capsule.  No associated hemorrhage or mass effect.  Underlying extensive chronic small vessel disease.  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  MRA negative for large vessel occlusion.  New since 2023 severe left PCA P2 segment stenosis.  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  Patient did not receive TNK.  Admission chemistries unremarkable except potassium 3.4, urine drug screen positive cocaine, hemoglobin A1c 5.8.  Echocardiogram ejection fraction of 60 to 65% no wall motion abnormalities.  Presently maintained on aspirin 81 mg daily and Plavix 75 mg daily for CVA prophylaxis x 3 weeks then Plavix alone.  Lovenox added for DVT prophylaxis.  Presently on dysphagia #2 thin liquid diet.  Therapy evaluations completed due to patient decreased functional mobility and left-sided weakness was admitted for a comprehensive rehab program.   Review of Systems  Constitutional:  Negative for chills and fever.  HENT:  Negative for hearing loss.   Eyes:  Negative for blurred vision and double vision.  Respiratory:  Negative for cough and shortness of breath.   Cardiovascular:  Negative for chest pain and palpitations.  Gastrointestinal:  Positive for  constipation. Negative for heartburn, nausea and vomiting.  Genitourinary:  Negative for dysuria, flank pain and hematuria.  Skin:  Negative for rash.  Neurological:  Positive for speech change, weakness and headaches.  All other systems reviewed and are negative.       Past Medical History:  Diagnosis Date   Abnormal Pap smear     Bronchitis     Fibroid     Headache(784.0)     Ovarian cyst     Plantar fasciitis     Seizures (HCC)     Stroke [facility])     Thyroid cancer (HCC) 20011   Trichomonas     Urinary tract infection               Past Surgical History:  Procedure Laterality Date   ABDOMINAL HYSTERECTOMY       CESAREAN SECTION       goiter        removed   laparoscopic   1988    removal  of ectopic preg, ruptured tube   THERAPEUTIC ABORTION       THYROIDECTOMY   march 2011    cancer             Family History  Problem Relation Age of Onset   Bell's palsy Mother     Hypertension Mother     Diabetes Mother     Stroke Father     Prostate cancer Father     Breast cancer Cousin     Hypertension Sister     Colon cancer Maternal Aunt     Colon cancer Paternal Aunt  Diverticulitis Sister     Diabetes Sister          Social History:  reports that she has been smoking cigarettes. She has a 2.5 pack-year smoking history. She has never used smokeless tobacco. She reports current alcohol use. She reports current drug use. Drug: Cocaine. Allergies:  Allergies       Allergies  Allergen Reactions   Gadolinium Derivatives Hives, Itching and Swelling      After MultiHance (gadolinium) injection, pt began sneezing.  After exam, pt told MR tech Donnamae Jude that she was itching.  Dr. Manson Passey evaluated pt, and noticed hives on pt's back.  Pt said that she felt swelling in her throat.  Dr. Manson Passey directed that pt be taken to hospital via ambulance.     Iohexol Hives, Itching and Other (See Comments)       Code: HIVES, Desc: pts tongue began itching post injection and throat  burning, Onset Date: [id number]     Proanthocyanidin Swelling      Swelling of the tongue   Grapeseed Extract [Nutritional Supplements]        Tongue swelll   Diphenhydramine Hcl Rash   Tramadol Rash            Medications Prior to Admission  Medication Sig Dispense Refill   clopidogrel (PLAVIX) 75 MG tablet Take 1 tablet (75 mg total) by mouth daily. 30 tablet 0   levothyroxine (SYNTHROID) 100 MCG tablet Take 1 tablet (100 mcg total) by mouth daily before breakfast. 30 tablet 0   rosuvastatin (CRESTOR) 40 MG tablet Take 1 tablet (40 mg total) by mouth daily. 90 tablet 1              Home: Home Living Family/patient expects to be discharged to:: Private residence Living Arrangements: Alone Available Help at Discharge: Family, Available PRN/intermittently Type of Home: Apartment Home Access: Stairs to enter [employer] of Steps: 3 Entrance Stairs-Rails: None Home Layout: One level Bathroom Shower/Tub: Engineer, manufacturing systems: Standard Bathroom Accessibility: Yes Home Equipment: Gilmer Mor - single point  Lives With: Alone   Functional History: Prior Function Prior Level of Function : Patient poor historian/Family not available (Information from chart review) Mobility Comments: pt reports ambulating with use of SPC. Pt is often difficult to understand throughout session due to dysarthria and expressive aphasia.   Functional Status:  Mobility: Bed Mobility Overal bed mobility: Needs Assistance Bed Mobility: Rolling, Supine to Sit Rolling: Min assist Sidelying to sit: Max assist Supine to sit: Min assist, Used rails Sit to supine: Contact guard assist, Used rails Sit to sidelying: Min assist General bed mobility comments: 1x LOB during sitting EOB. Supervision mostly Transfers Overall transfer level: Needs assistance Equipment used: Rolling walker (2 wheels) Transfers: Sit to/from Stand Sit to Stand: Min assist, Mod assist Bed to/from  chair/wheelchair/BSC transfer type:: Stand pivot Stand pivot transfers: Mod assist General transfer comment: Sit to stand 4x during session with Min to Mod A with verbal cues for sequencing and hand placement for safety. 3x from bed at Mod A and 1x from recliner at Min A. Assist keeping L hand on RW. Pt was stand pivot for transfer due to difficulty progressing LLE. Ambulation/Gait Pre-gait activities: Attempted gait. Pt was able to take ~6 steps with poor progression of LLE with ataxic movements and poor foot placement. Despite max multi modal cueing LE became anterior to COM creating a heavy posterior lean. Pt was assisted at Total A back to bed to prevent fall.  ADL: ADL Overall ADL's : Needs assistance/impaired Eating/Feeding:  (pt using LUE to assist with positioning of cup) Grooming: Moderate assistance, Sitting Upper Body Bathing: Moderate assistance, Sitting Lower Body Bathing: Maximal assistance, Bed level Upper Body Dressing : Maximal assistance, Sitting Lower Body Dressing: Maximal assistance, Bed level Toilet Transfer: Moderate assistance, Squat-pivot Toileting- Clothing Manipulation and Hygiene: Sit to/from stand, Moderate assistance (pt able to assist with donning/doffing briefs on L side using LUE.) Functional mobility during ADLs: Moderate assistance (stand pivot)   Cognition: Cognition Overall Cognitive Status: No family/caregiver present to determine baseline cognitive functioning Arousal/Alertness: Awake/alert Orientation Level: Oriented to person, Oriented to place, Oriented to time, Oriented to situation Year: 2025 Month: March Day of Week: Correct Attention: Sustained Sustained Attention: Appears intact Memory: Impaired Memory Impairment: Decreased recall of new information Awareness: Impaired Awareness Impairment: Emergent impairment Cognition Arousal: Alert Behavior During Therapy: WFL for tasks assessed/performed Overall Cognitive Status: No  family/caregiver present to determine baseline cognitive functioning   Physical Exam: Blood pressure 118/81, pulse 71, temperature 98.6 F (37 C), temperature source Oral, resp. rate 16, height 5\' 5"  (1.651 m), weight 67.3 kg, SpO2 96%. Physical Exam Neurological:     Comments: Patient is awake and alert.  Makes eye contact with examiner.  She does answer basic questions regards to name and age but dysarthric.  Follows simple commands.     General: No acute distress Mood and affect are appropriate Heart: Regular rate and rhythm no rubs murmurs or extra sounds Lungs: Clear to auscultation, breathing unlabored, no rales or wheezes Abdomen: Positive bowel sounds, soft nontender to palpation, nondistended Extremities: No clubbing, cyanosis, or edema Skin: No evidence of breakdown, no evidence of rash Neurologic: Cranial nerves II through XII intact, motor strength is 5/5 in right , 2-/5 left  deltoid, bicep, tricep, grip, 5/5 Right, 3/5 left hip flexor, knee extensors, 5/5 RIght , 2- left ankle dorsiflexor and plantar flexor Sensory exam normal sensation to light touch in right upper and lower extremities, feels pinch LLE , no sensation to pinch in left fingers Cerebellar exam normal finger to nose to finger RIght , unable to perform on left due to weakness Musculoskeletal: Full range of motion in all 4 extremities. No joint swelling      Lab Results Last 48 Hours  No results found for this or any previous visit (from the past 48 hours).    Imaging Results (Last 48 hours)  CT HEAD WO CONTRAST ( ) Result Date: 02/07/2024 CLINICAL DATA:  58 year old female with aphasia. Recent lacunar infarcts in the right lateral thalamus, internal capsule. EXAM: CT HEAD WITHOUT CONTRAST TECHNIQUE: Contiguous axial images were obtained from the base of the skull through the vertex without intravenous contrast. RADIATION DOSE REDUCTION: This exam was performed according to the departmental dose-optimization  program which includes automated exposure control, adjustment of the mA and/or kV according to patient size and/or use of iterative reconstruction technique. COMPARISON:  Brain MRI 02/03/2024 and head CT 02/02/2024. FINDINGS: Brain: Small recent lacunar infarcts now apparent on CT series 3, image 14. No hemorrhagic transformation or mass effect. Chronic bilateral basal ganglia, left corona radiata lacunar infarcts appear stable. Stable cerebral volume. No midline shift, mass effect, or evidence of intracranial mass lesion. No ventriculomegaly. No acute intracranial hemorrhage identified. No cortically based acute infarct identified. Vascular: No suspicious intracranial vascular hyperdensity. Skull: Intact, stable. Sinuses/Orbits: Visualized paranasal sinuses and mastoids are stable and well aerated. Other: No acute orbit or scalp soft tissue finding. IMPRESSION: 1. Expected CT  appearance of recent right thalamic region lacunar infarcts. No hemorrhagic transformation or mass effect. 2. Underlying chronic small vessel disease. No new acute intracranial abnormality. Electronically Signed   By: Odessa Fleming M.D.   On: 02/07/2024 12:12           Blood pressure 118/81, pulse 71, temperature 98.6 F (37 C), temperature source Oral, resp. rate 16, height 5\' 5"  (1.651 m), weight 67.3 kg, SpO2 96%.   Medical Problem List and Plan: 1. Functional deficits secondary to right PLIC infarction likely secondary to small vessel disease, prior L basal ganglia infarct 2017 (no residual) Left hemiparesis, severe LUE sensory deficit, severe dysarthria             -patient may  shower             -ELOS/Goals: 14-16d 2.  Antithrombotics: -DVT/anticoagulation:  Pharmaceutical: Lovenox             -antiplatelet therapy: Aspirin 81 mg daily and Plavix 75 mg daily x 3 weeks then Plavix alone 3. Pain Management: Tylenol as needed 4. Mood/Behavior/Sleep: Provide emotional support             -antipsychotic agents: N/A 5.  Neuropsych/cognition: This patient is capable of making decisions on her own behalf. 6. Skin/Wound Care: Routine skin checks 7. Fluids/Electrolytes/Nutrition: Routine and outs with follow-up chemistries 8.  Dysphagia.  Dysphagia #2 thin liquids.  Follow-up speech therapy 9.  Hyperlipidemia.  Crestor 10.  History of cocaine/tobacco use.  UDS positive for cocaine.  Provide counseling, this has been a chronic issue , seen in PCP notes 11.  Hypothyroidism.  Synthroid 12.  Constipation.  MiraLAX daily, Senokot-S 1 tab twice daily      Charlton Amor, PA-C 02/09/2024 "I have personally performed a face to face diagnostic evaluation of this patient.  Additionally, I have reviewed and concur with the physician assistant's documentation above." Erick Colace M.D. [facility] Fellow Am [employer] of [employer] and Re[employer] Diplomate Am Board of Electrodiagnostic Med Fellow Am Board of Interventional Pain

## 2024-02-09 NOTE — Progress Notes (Signed)
 patient was found on the floor and reported attempting to use the bathroom. No injuries were observed, and vital signs were within normal limits. Dr. Dartha Lodge has been informed, and no new orders were given.   .BP 132/84 (BP Location: Right Arm)   Pulse 85   Temp 98.3 F (36.8 C) (Oral)   Resp 17   Ht 5\' 5"  (1.651 m)   Wt 67.3 kg   SpO2 97%   BMI 24.69 kg/m

## 2024-02-09 NOTE — Discharge Summary (Signed)
 Physician Discharge Summary  Patient ID: Theresa Watkins MRN: [id number] DOB/AGE: [id number] y.o.  Admit date: 02/02/2024 Discharge date: 02/09/2024  Admission Diagnoses:  Discharge Diagnoses:  Principal Problem:   Acute CVA (cerebrovascular accident) [facility]) Active Problems:   Cocaine use disorder (HCC)   HLD (hyperlipidemia)   Essential hypertension   Prediabetes   Hypothyroidism   Discharged Condition: stable  Hospital Course:  Patient is a 58 year old female with past medical history significant for prior stroke, hypertension, hyperlipidemia, hypothyroidism, and cocaine abuse.  Patient was admitted with acute CVA.  Apparently, patient was found to be weak, had difficulty ambulating, with associated blurry vision.  On presentation to the hospital, patient was found to have slurred speech and left-sided weakness.  CT head revealed stable patchy chronic small vessel disease in the periventricular white matter, bilateral lacunar infarcts.  Patient was admitted for further assessment and management.  Neurology team was consulted to assist with patient's management.  MRI of the brain revealed several small acute to subacute lacunar infarct in the right lateral thalamus, posterior limb of the right external capsule, no associated hemorrhage or mass effect.  Underlying extensive chronic small vessel disease.  Urine drug screen was positive for cocaine.  Patient will be discharged on aspirin and Plavix for 3 weeks, then patient will continue only Plavix.  Patient be discharged to CIR.   Acute CVA (cerebrovascular accident) [facility]) -Patient presented with blurry vision, slurred speech and left-sided weakness -MRI brain showed several small acute to subacute lacunar infarct in the right lateral thalamus, posterior limb of the right external capsule, no associated hemorrhage or mass effect.  Underlying extensive chronic small vessel disease. -MRA head negative for large vessel occlusion, age  advanced atherosclerosis, severe left PCA P2 segment stenosis new since 2023.  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, worse on the left -Neurology consulted, recommended aspirin 81 mg daily, Plavix 75 mg daily for 3 weeks then Plavix alone -PT evaluation recommended  CIR -SLP evaluation recommended dysphagia 2 diet with thin liquids -Counseled on cocaine cessation -Hemoglobin A1c 5.8, LDL 90, continue statin -2D echo showed EF of 60 to 65%, normal diastolic parameters, no regional WMA -Patient be discharged to CIR.   Tobacco, cocaine use disorder (HCC) -UDS positive for cocaine -Counseled to quit.    HLD (hyperlipidemia) -LDL 90, continue Crestor 40 mg daily    Essential hypertension -Hold antihypertensives, allow permissive hypertension    Hypothyroidism Continue Synthroid   Mild hypokalemia -Replaced   Estimated body mass index is 24.69 kg/m as calculated from the following:   Height as of this encounter: 5\' 5"  (1.651 m).   Weight as of this encounter: 67.3 kg.  Consults: neurology and physical medicine  Significant Diagnostic Studies:  MRI HEAD WITHOUT CONTRAST:   TECHNIQUE: Multiplanar, multiecho pulse sequences of the brain and surrounding structures were obtained without intravenous contrast.   COMPARISON:  Intracranial MRA today reported separately. Head CT yesterday. Previous brain MRI 11/09/2022.   FINDINGS: Brain: Positive for several small foci of abnormal diffusion in the lateral right thalamus near the posterior limb external capsule which appears restricted on ADC (series 5, images 73 and 75). Underlying numerous chronic lacunar infarcts in the bilateral deep gray nuclei, left corona radiata, anterior body of the left corpus callosum. Faint T2 and FLAIR hyperintensity associated with the restricted diffusion. Several chronic microhemorrhages elsewhere in the deep gray nuclei but no acute intracranial hemorrhage.   No other diffusion  restriction. Chronic lacunar infarcts also in  the central pons. No cortical encephalomalacia identified. No midline shift, mass effect, evidence of mass lesion, ventriculomegaly, extra-axial collection or acute intracranial hemorrhage. Cervicomedullary junction and pituitary are within normal limits.   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 But the deep gray nuclei chronic hemosiderin is stable.   Vascular: Major intracranial vascular flow voids are stable since 2023.   Skull and upper cervical spine: Visualized bone marrow signal is within normal limits. Chronic cervical spine disc and endplate degeneration again noted.   Sinuses/Orbits: Stable, negative.   Other: Negative visible scalp and face.   IMPRESSION: 1. Positive for several small Acute to Subacute Lacunar Infarcts in the lateral Right thalamus, posterior limb right external capsule. No associated hemorrhage or mass effect. 2. Underlying extensive chronic small vessel disease. 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 3. MRA reported separately.     Electronically Signed   By: Odessa Fleming M.D.   On: 02/03/2024 04:20     MRA HEAD WITHOUT CONTRAST:   TECHNIQUE: Angiographic images of the Circle of Willis were acquired using MRA technique without intravenous contrast.   COMPARISON:  Brain MRI today reported separately. Previous intracranial MRA 11/09/2022.   FINDINGS: Anterior circulation: Antegrade flow in both ICA siphons. Chronic tortuosity of the distal cervical right ICA. ICA irregularity in keeping with atherosclerosis but no significant ICA stenosis. Ophthalmic artery origins appear stable and within normal limits. Posterior communicating artery origins remain patent. Infundibulum of the left posterior communicating artery origin again  noted (normal variant).   Patent carotid termini. Patent MCA and ACA origins. Diminutive or absent anterior communicating artery. Chronic moderate to severe distal A2 segment irregularity and stenosis which is worse on the left side. But no ACA occlusion (series 1040, image 1). This is similar to 2023.   Right MCA M1 segment and bifurcation remain patent. Right MCA branches are within normal limits.   Left MCA M1 segment and bifurcation remain patent. Mild M1 irregularity. Left MCA branches are within normal limits.   Posterior circulation: Antegrade flow in the posterior circulation appears stable since 2023, dominant right vertebral V4 segment. Distal vertebral arteries, left PICA, vertebrobasilar junction are patent. Patent basilar artery, AICA, SCA and PCA origins. There is distal vertebral and basilar artery irregularity suggestive of atherosclerosis, but no hemodynamically significant stenosis.   Right greater than left posterior communicating arteries are present. Bilateral PCA branches are patent. But there is new severe left proximal P2 segment irregularity and stenosis since 2023 (series 1052, image 8). And additional mild to moderate distal left P2 stenosis. There is flow signal beyond those lesions. Contralateral right PCA is only mildly irregular in the P2 segment.   Anatomic variants: Dominant right vertebral V4 segment.   Other: Brain MRI today reported separately.   IMPRESSION: 1. Negative for large vessel occlusion. Chronic intracranial irregularity compatible with age advanced atherosclerosis. 2. New since 2023 Severe Left PCA P2 segment stenosis. Chronic Moderate To Severe ACA A2 segment stenoses, worse on the left. Mild intracranial artery irregularity and stenosis otherwise. 3. MRI today reported separately.     Electronically Signed   By: Odessa Fleming M.D.   On: 02/03/2024 04:28       CT HEAD WITHOUT CONTRAST:   TECHNIQUE: Contiguous axial images  were obtained from the base of the skull through the vertex without intravenous  contrast.   RADIATION DOSE REDUCTION: This exam was performed according to the departmental dose-optimization program which includes automated exposure control, adjustment of the mA and/or kV according to patient size and/or use of iterative reconstruction technique.   COMPARISON:  Brain MRI 02/03/2024 and head CT 02/02/2024.   FINDINGS: Brain: Small recent lacunar infarcts now apparent on CT series 3, image 14. No hemorrhagic transformation or mass effect.   Chronic bilateral basal ganglia, left corona radiata lacunar infarcts appear stable. Stable cerebral volume. No midline shift, mass effect, or evidence of intracranial mass lesion. No ventriculomegaly. No acute intracranial hemorrhage identified. No cortically based acute infarct identified.   Vascular: No suspicious intracranial vascular hyperdensity.   Skull: Intact, stable.   Sinuses/Orbits: Visualized paranasal sinuses and mastoids are stable and well aerated.   Other: No acute orbit or scalp soft tissue finding.   IMPRESSION: 1. Expected CT appearance of recent right thalamic region lacunar infarcts. No hemorrhagic transformation or mass effect. 2. Underlying chronic small vessel disease. No new acute intracranial abnormality.     Electronically Signed   By: Odessa Fleming M.D.   On: 02/07/2024 12:12      Discharge Exam: Blood pressure 132/84, pulse 85, temperature 98.3 F (36.8 C), temperature source Oral, resp. rate 17, height 5\' 5"  (1.651 m), weight 67.3 kg, SpO2 97%.   Disposition: Discharge disposition: 02-Transferred to [facility]       Discharge Instructions     Ambulatory referral to Neurology   Complete by: As directed    Follow up with stroke clinic NP Jessica at [facility] in about 4-6 weeks. Thanks.   Diet - low sodium heart healthy   Complete by: As directed    Increase activity slowly   Complete by: As  directed       Allergies as of 02/09/2024       Reactions   Gadolinium Derivatives Hives, Itching, Swelling   After MultiHance (gadolinium) injection, pt began sneezing.  After exam, pt told MR tech Donnamae Jude that she was itching.  Dr. Manson Passey evaluated pt, and noticed hives on pt's back.  Pt said that she felt swelling in her throat.  Dr. Manson Passey directed that pt be taken to hospital via ambulance.     Iohexol Hives, Itching, Other (See Comments)    Code: HIVES, Desc: pts tongue began itching post injection and throat burning, Onset Date: [id number]   Proanthocyanidin Swelling   Swelling of the tongue   Grapeseed Extract [nutritional Supplements]    Tongue swelll   Diphenhydramine Hcl Rash   Tramadol Rash        Medication List     TAKE these medications    acetaminophen 325 MG tablet Commonly known as: TYLENOL Take 2 tablets (650 mg total) by mouth every 4 (four) hours as needed for mild pain (pain score 1-3) (or temp > 37.5 C (99.5 F)).   aspirin EC 81 MG tablet Take 1 tablet (81 mg total) by mouth daily. Swallow whole. Start taking on: February 10, 2024   clopidogrel 75 MG tablet Commonly known as: PLAVIX Take 1 tablet (75 mg total) by mouth daily.   levothyroxine 100 MCG tablet Commonly known as: SYNTHROID Take 1 tablet (100 mcg total) by mouth daily before breakfast.   mouth rinse Liqd solution 15 mLs by Mouth Rinse route as needed (for oral care).   nystatin 100000 UNIT/ML suspension Commonly known as: MYCOSTATIN Take 5 mLs (500,000 Units total) by mouth 4 (four) times daily for 14  days.   polyethylene glycol 17 g packet Commonly known as: MIRALAX / GLYCOLAX Take 17 g by mouth daily. Start taking on: February 10, 2024   rosuvastatin 40 MG tablet Commonly known as: CRESTOR Take 1 tablet (40 mg total) by mouth daily.   senna-docusate 8.6-50 MG tablet Commonly known as: Senokot-S Take 1 tablet by mouth 2 (two) times daily.        Follow-up Information      Ihor Austin, NP. Schedule an appointment as soon as possible for a visit in 1 month(s).   Specialty: Neurology Why: stroke clinic Contact information: [address] [place] [address] [phone]                 Time spent: 35 minutes.  SignedBarnetta Chapel 02/09/2024, 1:14 PM

## 2024-02-09 NOTE — Plan of Care (Signed)

## 2024-02-10 ENCOUNTER — Ambulatory Visit: Payer: Medicaid Other | Admitting: Internal Medicine

## 2024-02-10 ENCOUNTER — Other Ambulatory Visit: Payer: Self-pay

## 2024-02-10 ENCOUNTER — Encounter (HOSPITAL_COMMUNITY): Payer: Self-pay | Admitting: Physical Medicine and Rehabilitation

## 2024-02-10 DIAGNOSIS — I63511 Cerebral infarction due to unspecified occlusion or stenosis of right middle cerebral artery: Secondary | ICD-10-CM

## 2024-02-10 LAB — COMPREHENSIVE METABOLIC PANEL
ALT: 19 U/L (ref 0–44)
AST: 17 U/L (ref 15–41)
Albumin: 3.8 g/dL (ref 3.5–5.0)
Alkaline Phosphatase: 45 U/L (ref 38–126)
Anion gap: 9 (ref 5–15)
BUN: 18 mg/dL (ref 6–20)
CO2: 26 mmol/L (ref 22–32)
Calcium: 9.3 mg/dL (ref 8.9–10.3)
Chloride: 108 mmol/L (ref 98–111)
Creatinine, Ser: 0.82 mg/dL (ref 0.44–1.00)
GFR, Estimated: >60 mL/min (ref >60)
Glucose, Bld: 100 mg/dL — ABNORMAL HIGH (ref 70–99)
Potassium: 4.1 mmol/L (ref 3.5–5.1)
Sodium: 143 mmol/L (ref 135–145)
Total Bilirubin: 0.5 mg/dL (ref 0.0–1.2)
Total Protein: 7.1 g/dL (ref 6.5–8.1)

## 2024-02-10 LAB — CBC WITH DIFFERENTIAL/PLATELET
Abs Immature Granulocytes: 0.02 10*3/uL (ref 0.00–0.07)
Basophils Absolute: 0 10*3/uL (ref 0.0–0.1)
Basophils Relative: 1 %
Eosinophils Absolute: 0.1 10*3/uL (ref 0.0–0.5)
Eosinophils Relative: 1 %
HCT: 49.1 % — ABNORMAL HIGH (ref 36.0–46.0)
Hemoglobin: 16.1 g/dL — ABNORMAL HIGH (ref 12.0–15.0)
Immature Granulocytes: 0 %
Lymphocytes Relative: 40 %
Lymphs Abs: 2.3 10*3/uL (ref 0.7–4.0)
MCH: 28.6 pg (ref 26.0–34.0)
MCHC: 32.8 g/dL (ref 30.0–36.0)
MCV: 87.2 fL (ref 80.0–100.0)
Monocytes Absolute: 0.5 10*3/uL (ref 0.1–1.0)
Monocytes Relative: 8 %
Neutro Abs: 2.8 10*3/uL (ref 1.7–7.7)
Neutrophils Relative %: 50 %
Platelets: 197 10*3/uL (ref 150–400)
RBC: 5.63 MIL/uL — ABNORMAL HIGH (ref 3.87–5.11)
RDW: 13.9 % (ref 11.5–15.5)
WBC: 5.7 10*3/uL (ref 4.0–10.5)
nRBC: 0 % (ref 0.0–0.2)

## 2024-02-10 NOTE — Progress Notes (Signed)
 Orthopedic Tech Progress Note Patient Details:  Theresa Watkins 1966/03/02 [id number]  Called in order to [employer] for a [employer]   Patient ID: Theresa Watkins, female   DOB: 03-21-1966, 58 y.o.   MRN: [id number]  Donald Pore 02/10/2024, 12:02 PM

## 2024-02-10 NOTE — Progress Notes (Addendum)
 Admission assessment completed. Patient was medicated for headache with PRN tylenol. She is alert and oriented x4. She does have slurred speech but is able to make her needs known. She is aware her last BM was 3/18 and will be addressed this shift. Her skin is intact with no signs of pressure injuries or concerns. She is max assist with care needs.

## 2024-02-10 NOTE — Progress Notes (Signed)
 PROGRESS NOTE   Subjective/Complaints:  Pt reports has difficulty speaking and cannot read with stroke- "cannot talk".  Said had BM last night after 10 days.  Was extra large BM.   On D2 thin diet  Wants her breakfast- said they took it away- sounds like needs supervision.    ROS:   Limited by difficulty with speech, dysarthria   Objective:   No results found. Recent Labs    02/10/24 0513  WBC 5.7  HGB 16.1*  HCT 49.1*  PLT 197   Recent Labs    02/10/24 0513  NA 143  K 4.1  CL 108  CO2 26  GLUCOSE 100*  BUN 18  CREATININE 0.82  CALCIUM 9.3    Intake/Output Summary (Last 24 hours) at 02/10/2024 1308 Last data filed at 02/10/2024 0600 Gross per 24 hour  Intake 500 ml  Output --  Net 500 ml        Physical Exam: Vital Signs Blood pressure 110/69, pulse 69, temperature 98.4 F (36.9 C), resp. rate 18, height 5\' 6"  (1.676 m), weight 64.4 kg, SpO2 95%.   General: awake, alert, appropriate, supine in bed; NAD HENT: conjugate gaze;  L facial droop- dysarthria noted with a lot of salivary production; oropharynx very moist CV: regular rate and rhythm; no JVD Pulmonary: CTA B/L; no W/R/R- good air movement GI: soft, NT, ND, (+)BS Psychiatric: appropriate- flat Neurological: alert- very dysarthric? Neurologic: Cranial nerves II through XII intact, motor strength is 5/5 in right , 2-/5 left  deltoid, bicep, tricep, grip, 5/5 Right, 3/5 left hip flexor, knee extensors, 5/5 RIght , 2- left ankle dorsiflexor and plantar flexor Sensory exam normal sensation to light touch in right upper and lower extremities, feels pinch LLE , no sensation to pinch in left fingers Cerebellar exam normal finger to nose to finger RIght , unable to perform on left due to weakness Musculoskeletal: Full range of motion in all 4 extremities. No joint swelling    Assessment/Plan: 1. Functional deficits which require 3+ hours per  day of interdisciplinary therapy in a comprehensive inpatient rehab setting. Physiatrist is providing close team supervision and 24 hour management of active medical problems listed below. Physiatrist and rehab team continue to assess barriers to discharge/monitor patient progress toward functional and medical goals  Care Tool:  Bathing              Bathing assist       Upper Body Dressing/Undressing Upper body dressing        Upper body assist      Lower Body Dressing/Undressing Lower body dressing            Lower body assist       Toileting Toileting    Toileting assist       Transfers Chair/bed transfer  Transfers assist           Locomotion Ambulation   Ambulation assist              Walk 10 feet activity   Assist           Walk 50 feet activity   Assist  Walk 150 feet activity   Assist           Walk 10 feet on uneven surface  activity   Assist           Wheelchair     Assist               Wheelchair 50 feet with 2 turns activity    Assist            Wheelchair 150 feet activity     Assist          Blood pressure 110/69, pulse 69, temperature 98.4 F (36.9 C), resp. rate 18, height 5\' 6"  (1.676 m), weight 64.4 kg, SpO2 95%.   Medical Problem List and Plan: 1. Functional deficits secondary to right PLIC infarction likely secondary to small vessel disease, prior L basal ganglia infarct 2017 (no residual) Left hemiparesis, severe LUE sensory deficit, severe dysarthria             -patient may  shower             -ELOS/Goals: 14-16d  First day of evaluation- PT, OT and SLP- severe dysarthria  -wil d/c IV- not using Team conference today to determine LOS 2.  Antithrombotics: -DVT/anticoagulation:  Pharmaceutical: Lovenox             -antiplatelet therapy: Aspirin 81 mg daily and Plavix 75 mg daily x 3 weeks then Plavix alone 3. Pain Management: Tylenol as  needed  3/25- denies pain- con't regimen rpn  4. Mood/Behavior/Sleep: Provide emotional support             -antipsychotic agents: N/A 5. Neuropsych/cognition: This patient is capable of making decisions on her own behalf. 6. Skin/Wound Care: Routine skin checks 7. Fluids/Electrolytes/Nutrition: Routine and outs with follow-up chemistries 8.  Dysphagia.  Dysphagia #2 thin liquids.  Follow-up speech therapy 9.  Hyperlipidemia.  Crestor 10.  History of cocaine/tobacco use.  UDS positive for cocaine.  Provide counseling, this has been a chronic issue , seen in PCP notes 11.  Hypothyroidism.  Synthroid 12.  Constipation.  MiraLAX daily, Senokot-S 1 tab twice daily   3/25- LBM last night after 10 days with no BM 13. Dysarthria- hard to understand- SLP ordered 14. Polycythemia  3/25- Hb 16.1- and BUN up to 18, so is a little dry- BUN was 9 prior.    I spent a total of 38   minutes on total care today- > due to review of chart, d/w PA about pt care- review of labs- as well as Review of vitals and team conference to determine LOS.       LOS: 1 days A FACE TO FACE EVALUATION WAS PERFORMED  Theresa Watkins 02/10/2024, 9:38 AM

## 2024-02-10 NOTE — Evaluation (Signed)
 Speech Language Pathology Assessment and Plan  Patient Details  Name: Theresa Watkins MRN: [id number] Date of Birth: 1966-03-29  SLP Diagnosis: Dysarthria;Apraxia;Speech and Language deficits;Dysphagia  Rehab Potential: Good ELOS: 3-4 weeks    Today's Date: 02/10/2024 SLP Individual Time: 4782-9562 SLP Individual Time Calculation (min): 62 min   Hospital Problem: Principal Problem:   Right middle cerebral artery stroke [facility])  Past Medical History:  Past Medical History:  Diagnosis Date   Abnormal Pap smear    Bronchitis    Fibroid    Headache(784.0)    Ovarian cyst    Plantar fasciitis    Seizures (HCC)    Stroke (HCC)    Thyroid cancer (HCC) 20011   Trichomonas    Urinary tract infection    Past Surgical History:  Past Surgical History:  Procedure Laterality Date   ABDOMINAL HYSTERECTOMY     CESAREAN SECTION     goiter     removed   laparoscopic  1988   removal  of ectopic preg, ruptured tube   THERAPEUTIC ABORTION     THYROIDECTOMY  march 2011   cancer    Assessment / Plan / Recommendation Clinical Impression  Theresa Watkins is a 58 year old right-handed female with history of hypertension, hyperlipidemia, hypothyroidism, cocaine/polysubstance abuse as well as tobacco use. Per chart review patient lives alone. Reportedly used a straight point cane prior to admission. She does have family in the area. Presented 02/02/2024 with aphasia as well as left-sided weakness after being found down by her son. CT/MRI showed several small acute to subacute lacunar infarcts in the lateral right thalamus, posterior limb right external capsule. No associated hemorrhage or mass effect. Underlying extensive chronic small vessel disease. 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 MRA negative for large vessel occlusion. New since 2023 severe left PCA P2 segment stenosis. 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 Patient did not receive TNK. Presently on dysphagia #2 thin liquid diet. Therapy evaluations completed due to patient decreased functional mobility and left-sided weakness was admitted for a comprehensive rehab program. Pt admitted to CIR on 02/10/24.  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 Oral motor deficits noted in dysphagia portion of assessment.  Dysarthria c/b strained vocal quality, imprecise articulation, and decreased vocal intensity across length of message. Suspect coordination of respiration and phonation impacting vocal intensity. Pt also presents with s/s apraxia as indicated by groping and inconsistent speech errors. Pt's overall speech intelligibility was ~75% with words and ~40% with phrases and sentences. She was consistently aware of deficits and demonstrated increased fatigue with speech as session progressed.    Cognitive/ Linguistic:  Cognition not addressed formally due to time constraints. However, pt reports feeling as if she is functioning at her baseline. Due to hx of CVA's SLP suspecting pt is at her baseline. Pt was oriented x4, able to verbalize her PLOF along with recent medical history given additional time due to speech difficulties. She reports independence with medication and financial management. Pt may benefit from formal cognitive assessment at a later time. Language was assessed informally with strengths in auditory comprehension, following directions, and naming. No skilled intervention warranted for language at this time.   Dysphagia: Pt presents with a moderate oropharyngeal dysphagia. Oral mechanism exam revealed upper dentures, edentulous lower, left side facial droop, lingual deviation to left, and labial weakness on left.  Pt presented with D1, D3, and thin liquids. She administered all trials. Pt with prolonged mastication, decreased awareness, decreased bolus  formation, pocketing in lateral sulci, and lingual residue of D3 solids.When prompted to clear sulcus pt stated, "I don't feel anything."  In response to D1 solids, pt noted with lingual residue remaining on left lateral sulci which was able to be cleared with additional time. Pt consumed thin liquids via cup and straw sips. Pt noted with cough and/ or throat clear after swallow of liquids given larger consecutive boluses. With cues for small single sips pt with no s/s asp. SLP recommending continuation of Dysphagia 2 solids and thin liquids, meds crushed in puree with full supervision.   Pt would benefit from skilled SLP services to maximize dysphagia and dysarthria in order to maximize her independence prior to discharge. Anticipate pt will require supervision at home and f/u home health or outpatient SLP services.     Skilled Therapeutic Interventions          BSE, informal assessment measures, and OME administered. Please see full report for additional details.     SLP Assessment  Patient will need skilled Speech Lanaguage Pathology Services during CIR admission    Recommendations  SLP Diet Recommendations: Dysphagia 2 (Fine chop);Thin Medication Administration: Crushed with puree Supervision: Intermittent supervision to cue for compensatory strategies Compensations: Slow rate;Small sips/bites;Lingual sweep for clearance of pocketing Postural Changes and/or Swallow Maneuvers: Seated upright 90 degrees Oral Care Recommendations: Oral care BID Recommendations for Other Services: Neuropsych consult Patient destination: Home Follow up Recommendations: Home Health SLP;Outpatient SLP Equipment Recommended: To be determined    SLP Frequency 3 to 5 out of 7 days   SLP Duration  SLP Intensity  SLP Treatment/Interventions 3-4 weeks  Minumum of 1-2 x/day, 30 to 90 minutes  Multimodal communication approach;Speech/Language facilitation;Functional tasks;Therapeutic Activities;Therapeutic  Exercise;Internal/external aids;Dysphagia/aspiration precaution training;Patient/family education    Pain Pain Assessment Pain Scale: 0-10 Pain Score: 0-No pain  Prior Functioning Type of Home: Apartment  Lives With: Alone Available Help at Discharge: Family;Available PRN/intermittently Vocation: On disability  SLP Evaluation Cognition Overall Cognitive Status: No family/caregiver present to determine baseline cognitive functioning Arousal/Alertness: Awake/alert Orientation Level: Oriented X4 Year: 2025 Month: March Day of Week: Correct Attention: Sustained;Selective Sustained Attention: Appears intact Selective Attention: Impaired Selective Attention Impairment: Verbal basic;Functional basic Memory: Impaired Memory Impairment: Decreased recall of new information Awareness: Impaired Awareness Impairment: Emergent impairment;Anticipatory impairment Problem Solving: Appears intact Problem Solving Impairment: Functional complex;Verbal complex Executive Function: Initiating;Decision Making Decision Making: Impaired Decision Making Impairment: Functional basic Initiating: Impaired Initiating Impairment: Verbal complex Safety/Judgment: Appears intact  Comprehension Auditory Comprehension Overall Auditory Comprehension: Appears within functional limits for tasks assessed Expression Expression Primary Mode of Expression: Verbal Verbal Expression Overall Verbal Expression: Appears within functional limits for tasks assessed (Suspect motor speech deficits > language) Initiation: Impaired Level of Generative/Spontaneous Verbalization: Phrase Repetition: No impairment Naming: No impairment Interfering Components: Speech intelligibility;Premorbid deficit Non-Verbal Means of Communication: Not applicable Written Expression Dominant Hand: Right Oral Motor Oral Motor/Sensory Function Overall Oral Motor/Sensory Function: Mild impairment Facial ROM: Reduced left Facial  Symmetry: Abnormal symmetry left Facial Strength: Reduced left;Suspected CN VII (facial) dysfunction Lingual ROM: Reduced right;Suspected CN XII (hypoglossal) dysfunction Lingual Symmetry: Within Functional Limits Lingual Strength: Suspected CN XII (hypoglossal) dysfunction Mandible: Within Functional Limits Motor Speech Overall Motor Speech: Impaired Respiration: Impaired Level of Impairment: Phrase Phonation:  (strained vocal quality) Resonance: Within functional limits Articulation: Impaired Level of Impairment: Phrase Intelligibility: Intelligibility reduced Word: 50-74% accurate  Phrase: 25-49% accurate Sentence: Not tested Motor Planning: Impaired Level of Impairment: Word Motor Speech Errors: Groping for words;Inconsistent;Aware Interfering Components: Inadequate dentition;Premorbid status Effective Techniques: Slow rate;Pacing  Care Tool Care Tool Cognition Ability to hear (with hearing aid or hearing appliances if normally used Ability to hear (with hearing aid or hearing appliances if normally used): 0. Adequate - no difficulty in normal conservation, social interaction, listening to TV   Expression of Ideas and Wants Expression of Ideas and Wants: 2. Frequent difficulty - frequently exhibits difficulty with expressing needs and ideas   Understanding Verbal and Non-Verbal Content Understanding Verbal and Non-Verbal Content: 3. Usually understands - understands most conversations, but misses some part/intent of message. Requires cues at times to understand  Memory/Recall Ability Memory/Recall Ability : Current season;Location of own room;That he or she is in a hospital/hospital unit   PMSV Assessment  PMSV Trial Intelligibility: Intelligibility reduced Word: 50-74% accurate Phrase: 25-49% accurate Sentence: Not tested  Bedside Swallowing Assessment General Temperature Spikes Noted: No Respiratory Status: Room air History of Recent Intubation:  No Behavior/Cognition: Alert;Cooperative Oral Cavity - Dentition: Edentulous;Dentures, top Vision: Functional for self-feeding Patient Positioning: Upright in bed Baseline Vocal Quality:  (strained) Volitional Cough: Strong Volitional Swallow: Able to elicit  Oral Care Assessment Oral Assessment  (WDL): Exceptions to WDL Lips: Asymmetrical Teeth: Missing (Comment) (dentures up top, edentulous lower) Tongue: Coated white Mucous Membrane(s): Moist;Pink Level of Consciousness: Alert Is patient on any of following O2 devices?: None of the above Nutritional status: Dysphagia Oral Assessment Risk : High Risk Ice Chips Ice chips: Not tested Thin Liquid Thin Liquid: Impaired Presentation: Self Fed;Straw;Cup Pharyngeal  Phase Impairments: Suspected delayed Swallow;Throat Clearing - Immediate;Cough - Immediate;Wet Vocal Quality Nectar Thick Nectar Thick Liquid: Not tested Honey Thick Honey Thick Liquid: Not tested Puree Puree: Impaired Oral Phase Functional Implications: Left anterior spillage;Left lateral sulci pocketing Solid Solid: Impaired Presentation: Self Fed Oral Phase Functional Implications: Prolonged oral transit;Oral residue;Left lateral sulci pocketing;Left anterior spillage;Impaired mastication BSE Assessment Risk for Aspiration Impact on safety and function: Moderate aspiration risk Other Related Risk Factors: Previous CVA  Short Term Goals: Week 1: SLP Short Term Goal 1 (Week 1): Pt will trial Dys 3 textures with SLP only demonstrating timely and functional oral phase of swallow provided min-mod A cues SLP Short Term Goal 2 (Week 1): Pt will utilize multimodal communication to express basic wants and needs with sup A SLP Short Term Goal 3 (Week 1): Patient will utilize speech intelligibility strategies at the phrase level to achieve 75% intelligibility with mod A SLP Short Term Goal 4 (Week 1): Patient will utilize swallowing compensatory strategies to reduce s/sx of  aspiration during consumption of thin liquids given min multimodal A  Refer to Care Plan for Long Term Goals  Recommendations for other services: Neuropsych  Discharge Criteria: Patient will be discharged from SLP if patient refuses treatment 3 consecutive times without medical reason, if treatment goals not met, if there is a change in medical status, if patient makes no progress towards goals or if patient is discharged from hospital.  The above assessment, treatment plan, treatment alternatives and goals were discussed and mutually agreed upon: by patient  [name] 02/10/2024, 4:00 PM

## 2024-02-10 NOTE — Progress Notes (Signed)
 Patient ID: Theresa Watkins, female   DOB: 21-Mar-1966, 58 y.o.   MRN: [id number]  [id number]- SW spoke with pt mother to introduce self, explain role, and discuss discharge process. She confirms that her dtr will come to her home, however, she is not able to provide physical assistance as she is 58 years old. She states patient's son helps PRN when he is not working. SW informed will follow-up with updates after team conference.  Cecile Sheerer, MSW, LCSW Office: [phone] Cell: [phone] Fax: [phone]

## 2024-02-10 NOTE — Plan of Care (Signed)
  Problem: RH Balance Goal: LTG Patient will maintain dynamic standing with ADLs (OT) Description: LTG:  Patient will maintain dynamic standing balance with assist during activities of daily living (OT)  Flowsheets (Taken 02/10/2024 1253) LTG: Pt will maintain dynamic standing balance during ADLs with: Contact Guard/Touching assist   Problem: RH Eating Goal: LTG Patient will perform eating w/assist, cues/equip (OT) Description: LTG: Patient will perform eating with assist, with/without cues using equipment (OT) Flowsheets (Taken 02/10/2024 1253) LTG: Pt will perform eating with assistance level of: Set up assist    Problem: RH Grooming Goal: LTG Patient will perform grooming w/assist,cues/equip (OT) Description: LTG: Patient will perform grooming with assist, with/without cues using equipment (OT) Flowsheets (Taken 02/10/2024 1253) LTG: Pt will perform grooming with assistance level of: Set up assist    Problem: RH Bathing Goal: LTG Patient will bathe all body parts with assist levels (OT) Description: LTG: Patient will bathe all body parts with assist levels (OT) Flowsheets (Taken 02/10/2024 1253) LTG: Pt will perform bathing with assistance level/cueing: Minimal Assistance - Patient > 75%   Problem: RH Dressing Goal: LTG Patient will perform upper body dressing (OT) Description: LTG Patient will perform upper body dressing with assist, with/without cues (OT). Flowsheets (Taken 02/10/2024 1253) LTG: Pt will perform upper body dressing with assistance level of: Minimal Assistance - Patient > 75% Goal: LTG Patient will perform lower body dressing w/assist (OT) Description: LTG: Patient will perform lower body dressing with assist, with/without cues in positioning using equipment (OT) Flowsheets (Taken 02/10/2024 1253) LTG: Pt will perform lower body dressing with assistance level of: Minimal Assistance - Patient > 75%   Problem: RH Toileting Goal: LTG Patient will perform toileting task  (3/3 steps) with assistance level (OT) Description: LTG: Patient will perform toileting task (3/3 steps) with assistance level (OT)  Flowsheets (Taken 02/10/2024 1253) LTG: Pt will perform toileting task (3/3 steps) with assistance level: Minimal Assistance - Patient > 75%   Problem: RH Functional Use of Upper Extremity Goal: LTG Patient will use RT/LT upper extremity as a (OT) Description: LTG: Patient will use right/left upper extremity as a stabilizer/gross assist/diminished/nondominant/dominant level with assist, with/without cues during functional activity (OT) Flowsheets (Taken 02/10/2024 1253) LTG: Use of upper extremity in functional activities:  LUE as a stabilizer  LUE as gross assist level  RUE as dominant level LTG: Pt will use upper extremity in functional activity with assistance level of: Independent with assistive device   Problem: RH Toilet Transfers Goal: LTG Patient will perform toilet transfers w/assist (OT) Description: LTG: Patient will perform toilet transfers with assist, with/without cues using equipment (OT) Flowsheets (Taken 02/10/2024 1253) LTG: Pt will perform toilet transfers with assistance level of: Minimal Assistance - Patient > 75%   Problem: RH Tub/Shower Transfers Goal: LTG Patient will perform tub/shower transfers w/assist (OT) Description: LTG: Patient will perform tub/shower transfers with assist, with/without cues using equipment (OT) Flowsheets (Taken 02/10/2024 1253) LTG: Pt will perform tub/shower stall transfers with assistance level of: Minimal Assistance - Patient > 75%   Problem: RH Awareness Goal: LTG: Patient will demonstrate awareness during functional activites type of (OT) Description: LTG: Patient will demonstrate awareness during functional activites type of (OT) Flowsheets (Taken 02/10/2024 1253) Patient will demonstrate awareness during functional activites type of: Anticipatory LTG: Patient will demonstrate awareness during  functional activites type of (OT): Supervision

## 2024-02-10 NOTE — Progress Notes (Signed)
 Inpatient Rehabilitation  Patient information reviewed and entered into eRehab system by Feliberto Gottron, M.A., CCC-SLP, Rehab Quality Coordinator.  Information including medical coding, functional ability and quality indicators will be reviewed and updated through discharge.

## 2024-02-10 NOTE — Discharge Summary (Incomplete)
 Physician Discharge Summary  Patient ID: NICKIE WARWICK MRN: [id number] DOB/AGE: 1966/03/19 58 y.o.  Admit date: 02/09/2024 Discharge date: 04/28/2024  Discharge Diagnoses:  Principal Problem:   Right middle cerebral artery stroke [facility]) Active Problems:   Polysubstance abuse (HCC) DVT prophylaxis Dysphagia Hyperlipidemia History of tobacco/cocaine use Hypothyroidism Constipation Left second digit PIP lesion Mood stabilization Blurred vision to left eye  Discharged Condition: Stable  Significant Diagnostic Studies: DG Wrist Complete Left Result Date: 04/13/2024 CLINICAL DATA:  Left hand and wrist pain EXAM: LEFT WRIST - COMPLETE 3+ VIEW COMPARISON:  03/09/2024 FINDINGS: 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 Borderline widening of the scapholunate articulation but normal scapholunate angle. The wrist is flexed on the lateral projection. No appreciable fracture or acute bony finding. There is potentially some mild soft tissue swelling dorsal to the radiocarpal joint. IMPRESSION: 1. 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 2. Borderline widening of the scapholunate articulation but normal scapholunate angle. 3. Potential mild soft tissue swelling dorsal to the radiocarpal joint. Electronically Signed   By: Freida Jes M.D.   On: 04/13/2024 12:42   DG Hand Complete Left Result Date: 04/13/2024 CLINICAL DATA:  Left hand pain EXAM: LEFT HAND - COMPLETE 3+ VIEW COMPARISON:  None Available. FINDINGS: The fingers are flexed during imaging, resulting in suboptimal orientation and bony overlap which lowers diagnostic sensitivity and specificity. The technologist notes these were the best images obtainable. Presumably the patient is unable to straighten the fingers. 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 Spurring along distal interphalangeal joints favors  osteoarthritis. No visible fracture or bony destructive findings. IMPRESSION: 1. 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 2. The fingers are flexed during imaging, resulting in suboptimal orientation and bony overlap which lowers diagnostic sensitivity and specificity. Presumably the patient is unable to straighten the fingers. Electronically Signed   By: Freida Jes M.D.   On: 04/13/2024 12:40   DG Abd 2 Views Result Date: 04/04/2024 CLINICAL DATA:  Constipation EXAM: ABDOMEN - 2 VIEW COMPARISON:  None Available. FINDINGS: The bowel gas pattern is normal. Stool burden is moderate. There is no evidence of free air. No radio-opaque calculi or other significant radiographic abnormality is seen. IMPRESSION: Nonobstructive bowel gas pattern. Moderate stool burden. Electronically Signed   By: Tyron Gallon M.D.   On: 04/04/2024 19:07    Labs:  Basic Metabolic Panel: Recent Labs  Lab 04/27/24 0445  NA 140  K 4.3  CL 109  CO2 23  GLUCOSE 94  BUN 18  CREATININE 0.80  CALCIUM  8.7*    CBC: Recent Labs  Lab 04/26/24 0526  WBC 5.3  NEUTROABS 2.4  HGB 14.3  HCT 45.3  MCV 88.5  PLT 173    CBG: No results for input(s): GLUCAP in the last 168 hours.  Family history.  Mother with Bell's palsy hypertension diabetes.  Father with CVA prostate cancer.  Denies any esophageal or rectal cancer  Brief HPI:   Theresa Watkins is a 58 y.o. right-handed female with history significant for hypertension hyperlipidemia hypothyroidism, cocaine/polysubstance abuse as well as tobacco use.  Per chart review lives alone.  Reportedly used a straight point cane prior to admission.  She does have supportive family in the area.  Presented 02/02/2024 with aphasia as well as left-sided weakness after being found down by her son.  CT/MRI showed several small acute to subacute lacunar infarcts in the lateral right thalamus,  posterior limb right external  capsule.  No associated hemorrhage or mass effect.  Underlying extensive chronic small vessel disease.  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  MRA negative for large vessel occlusion.  New since 2023 severe left PCA P2 segment stenosis.  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  Patient did not receive TNK.  Admission chemistries unremarkable except potassium 3.4 urine drug screen positive cocaine hemoglobin A1c 5.8.  Echocardiogram ejection fraction of 60 to 65% no wall motion abnormalities.  Maintained on low-dose aspirin  and Plavix  for CVA prophylaxis x 3 weeks then Plavix  alone.  Lovenox  added for DVT prophylaxis.  Presently on a dysphagia #2 thin liquid diet.  Therapy evaluations completed due to patient decreased functional mobility left-sided weakness was admitted for a comprehensive rehab program.   Hospital Course: CHAN ROSASCO was admitted to rehab 02/09/2024 for inpatient therapies to consist of PT, ST and OT at least three hours five days a week. Past admission physiatrist, therapy team and rehab RN have worked together to provide customized collaborative inpatient rehab.  Pertaining to patient's right PLIC infarction likely secondary to small vessel disease prior left basal ganglia infarct 2017 no residual.  She continued to participate with therapies maintain on low-dose aspirin  and Plavix  75 mg daily x 3 weeks then Plavix  alone.  Lovenox  for DVT prophylaxis no bleeding episodes.  Persistent blurred vision of left eye after CVA with ambulatory referral obtained with ophthalmology services.  She was on a dysphagia #3 thin liquid diet followed by speech therapy advance as tolerated to regular.   Crestor  ongoing for hyperlipidemia.  Urine drug screen positive for cocaine as well as history of tobacco use and patient did receive counts regards to cessation of these  products.  She did have some nonspecific left hip pain x-ray showing moderate degenerative joint disease no fracture.  Placed on Voltaren  gel as well as vitamin D  and calcium .  Synthroid  ongoing for hypothyroidism.  Bouts of constipation resolved with laxative assistance.  Mood stabilization with duloxetine  as well as neuropsychology follow-up with emotional support provided.  Patient did have a left second digit PIP lesion ambulatory referral obtained for outpatient dermatology concern for basal cell/squamous cell   Blood pressures were monitored on TID basis and remained controlled monitored     Rehab course: During patient's stay in rehab weekly team conferences were held to monitor patient's progress, set goals and discuss barriers to discharge. At admission, patient required moderate assist sit to stand moderate assist stand pivot transfers minimal assist supine to sit  Physical exam.  Blood pressure 118/81 pulse 71 temperature 98.6 respiration 16 oxygen saturation is 96% room air Constitutional.  No acute distress HEENT Head.  Normocephalic and atraumatic Eyes.  Pupils round and reactive to light no discharge without nystagmus Neck.  Supple nontender no JVD without thyromegaly Cardiac regular rate and rhythm without any extra sounds or murmur heard Abdomen.  Soft nontender positive bowel sounds without rebound Respiratory effort normal no respiratory distress without wheeze Extremities.  No clubbing cyanosis or edema Neurologic.  Cranial nerves II through XII intact, motor strength 5/5 in right, 2 -/5 left deltoid, bicep, tricep, grip, 5/5 right, 3/5 left hip flexor, knee extensors, 5/5 right, 2 - left ankle dorsi plantarflexion. Sensory exam normal sensation.  He/She  has had improvement in activity tolerance, balance, postural control as well as ability to  compensate for deficits. He/She has had improvement in functional use RUE/LUE  and RLE/LLE as well as improvement in awareness.   Transition sit to stand and mini squats with handrail and minimal assist.  Propels her wheelchair with bilateral lower extremities.  Stand pivot transfer from bed rail and minimal assist.  Ambulatory transfer to wheelchair with moderate assist and hand-held assist.  ADLs focused on bed mobility stand pivot squat transfers toileting bathing and shower level dressing with sit to stand from wheelchair and safety awareness to increase independence with basic ADLs.  Supine to sit edge of bed with supervision.  Squat pivot transfers to wheelchair with minimal assist during ADLs.  Toilet transfers with minimal assist.  Patient ambulates with hand-held assist to TT B with moderate assist.  Bathing with minimal assist seated on T TB.  She did need some cues at times to maintain safety awareness.  Dressing with sit to stand from wheelchair at sink.  Moderate verbal cues for Hemi dressing technique.  SLP follow-up with steady gains requires minimal assist for adequate use of safe swallowing strategies with mechanical soft diet.  She has improved 80% intelligibility in known context at the sentence level.  Plan was for skilled nursing facility and discharge taking place 04/28/2024       Disposition:  Discharge disposition: 03-Skilled Nursing Facility        Diet: Regular  Special Instructions: No driving smoking or alcohol   Ambulatory referral obtained with dermatology services for left second PIP lesion however if the skilled nursing facility is unable to meet those demands or transportation they will need to find someone in the [facility] area  Ambulatory referral obtained with ophthalmology services for left eye blurred vision after CVA however if the skilled nursing facility is unable to meet those demands for transportation they will need to find someone in the [facility] area  Ambulatory referral obtained with neurology services/[facility] however a skilled nursing facility unable to  meet those demands for transportation they will have to find someone in the [place] area  Medications at discharge 1.  Tylenol  as needed 2.  Vitamin D  1000 units daily 3.  Plavix  75 mg p.o. daily 4.  Synthroid  100 mcg p.o. daily 5.  Tylenol  #3 1 tablet every 8 hours as needed pain 6.  Crestor  40 mg p.o. daily 7.  Claritin  10 mg daily 8.  Calcium  citrate 1 tablet daily 9.  Vitamin D  1000 units daily 10.  Cymbalta  120 mg daily 11.  Lyrica  50 mg daily 12.  Melatonin 3 mg nightly 13.  Voltaren  gel 2 g 4 times daily as needed to affected area 14.  Lidoderm  patch changes directed 15.  Robaxin  500 mg every 8 hours as needed  30-35 minutes were spent completing discharge summary and discharge planning  Discharge Instructions     Ambulatory referral to Dermatology   Complete by: As directed    Eval and treat ? Basal cell carcinoma left index PIP   Is there a picture in the patient's chart?: No   Ambulatory referral to Neurology   Complete by: As directed    An appointment is requested in approximately: 4 weeks right PLIC infarction   Ambulatory referral to Ophthalmology   Complete by: As directed    Evaluate and treat for left eye blurred vision after CVA   Ambulatory referral to Physical Medicine Rehab   Complete by: As directed    Follow-up 1 month right MCA infarction.  Patient for skilled nursing  facility placement        Follow-up Information     Lovorn, Jacqlyn Matas, MD Follow up.   Specialty: Physical Medicine and Rehabilitation Why: Office to call for appointment Contact information: [address] [place] [address] [phone]                 Signed: Sterling Eisenmenger 04/28/2024, 4:47 AM

## 2024-02-10 NOTE — Evaluation (Signed)
 Occupational Therapy Assessment and Plan  Patient Details  Name: Theresa Watkins MRN: [id number] Date of Birth: 1966-02-16  OT Diagnosis: abnormal posture, cognitive deficits, disturbance of vision, flaccid hemiplegia and hemiparesis, hemiplegia affecting non-dominant side, and muscle weakness (generalized) Rehab Potential: Rehab Potential (ACUTE ONLY): Good ELOS: 3-4 weeks   Today's Date: 02/10/2024 OT Individual Time: 0900-1020 OT Individual Time Calculation (min): 80 min     Hospital Problem: Principal Problem:   Right middle cerebral artery stroke [facility])   Past Medical History:  Past Medical History:  Diagnosis Date   Abnormal Pap smear    Bronchitis    Fibroid    Headache(784.0)    Ovarian cyst    Plantar fasciitis    Seizures (HCC)    Stroke (HCC)    Thyroid cancer (HCC) 20011   Trichomonas    Urinary tract infection    Past Surgical History:  Past Surgical History:  Procedure Laterality Date   ABDOMINAL HYSTERECTOMY     CESAREAN SECTION     goiter     removed   laparoscopic  1988   removal  of ectopic preg, ruptured tube   THERAPEUTIC ABORTION     THYROIDECTOMY  march 2011   cancer    Assessment & Plan Clinical Impression:Theresa Watkins is a 58 year old right-handed female with history of hypertension, hyperlipidemia, hypothyroidism, cocaine/polysubstance abuse as well as tobacco use. Per chart review patient lives alone. Reportedly used a straight point cane prior to admission. She does have family in the area. Presented 02/02/2024 with aphasia as well as left-sided weakness after being found down by her son. CT/MRI showed several small acute to subacute lacunar infarcts in the lateral right thalamus, posterior limb right external capsule. No associated hemorrhage or mass effect. Underlying extensive chronic small vessel disease. 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 MRA negative for large vessel occlusion. New since 2023 severe left PCA P2 segment stenosis. 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 Patient did not receive TNK. Admission chemistries unremarkable except potassium 3.4, urine drug screen positive cocaine, hemoglobin A1c 5.8. Echocardiogram ejection fraction of 60 to 65% no wall motion abnormalities. Presently maintained on aspirin 81 mg daily and Plavix 75 mg daily for CVA prophylaxis x 3 weeks then Plavix alone. Lovenox added for DVT prophylaxis. Presently on dysphagia #2 thin liquid diet. Therapy evaluations completed due to patient decreased functional mobility and left-sided weakness was admitted for a comprehensive rehab program.  Patient transferred to CIR on 02/09/2024 .    Patient currently requires max with basic self-care skills secondary to muscle weakness, decreased cardiorespiratoy endurance, impaired timing and sequencing, decreased coordination, and decreased motor planning, and decreased sitting balance, decreased standing balance, decreased postural control, hemiplegia, and decreased balance strategies.  Prior to hospitalization, patient could complete ADLs with modified independent .  Patient will benefit from skilled intervention to decrease level of assist with basic self-care skills and increase level of independence with iADL prior to discharge home with care partner.  Anticipate patient will require 24 hour supervision and follow up outpatient.  OT - End of Session Activity Tolerance: Tolerates 30+ min activity with multiple rests Endurance Deficit: Yes OT Assessment Rehab Potential (ACUTE ONLY): Good OT Barriers to Discharge: Decreased caregiver support;Home environment access/layout;Incontinence OT Patient demonstrates impairments in the following area(s): Balance;Safety;Sensory;Endurance;Motor;Pain OT Basic ADL's Functional Problem(s): Eating;Grooming;Bathing;Dressing;Toileting OT Transfers  Functional Problem(s): Tub/Shower;Toilet OT Additional  Impairment(s): Fuctional Use of Upper Extremity OT Plan OT Intensity: Minimum of 1-2 x/day, 45 to 90 minutes OT Frequency: 5 out of 7 days OT Duration/Estimated Length of Stay: 3-4 weeks OT Treatment/Interventions: Balance/vestibular training;Discharge planning;Functional electrical stimulation;Pain management;Self Care/advanced ADL retraining;Therapeutic Activities;UE/LE Coordination activities;Cognitive remediation/compensation;Disease mangement/prevention;Functional mobility training;Patient/family education;Skin care/wound managment;Therapeutic Exercise;Visual/perceptual remediation/compensation;Community reintegration;DME/adaptive equipment instruction;Neuromuscular re-education;Psychosocial support;Splinting/orthotics;UE/LE Strength taining/ROM;Wheelchair propulsion/positioning OT Self Feeding Anticipated Outcome(s): set-up OT Basic Self-Care Anticipated Outcome(s): Min A OT Toileting Anticipated Outcome(s): Min A OT Bathroom Transfers Anticipated Outcome(s): Min A OT Recommendation Recommendations for Other Services: Therapeutic Recreation consult Therapeutic Recreation Interventions: Pet therapy Patient destination: Home Follow Up Recommendations: 24 hour supervision/assistance Equipment Recommended: To be determined Equipment Details: does not own any DME   OT Evaluation Precautions/Restrictions  Precautions Precautions: Fall Precaution/Restrictions Comments: L hemi Restrictions Weight Bearing Restrictions Per Provider Order: No General Chart Reviewed: Yes Response to Previous Treatment: Not applicable Family/Caregiver Present: No Pain Pain Assessment Pain Scale: 0-10 Pain Score: 3  Pain Location: Head Pain Descriptors / Indicators: Headache Pain Intervention(s): Medication (See eMAR) Home Living/Prior Functioning Home Living Family/patient expects to be discharged to:: Private residence Living Arrangements:  Other relatives Available Help at Discharge: Family, Available PRN/intermittently (mother lives near her but is 47, cannot physically assist) Type of Home: Apartment Home Access: Stairs to enter Secretary/administrator of Steps: 3 Entrance Stairs-Rails: None Home Layout: One level Teacher, early years/pre: Yes  Lives With: Alone IADL History Homemaking Responsibilities: Yes Meal Prep Responsibility: Primary Laundry Responsibility: Primary Cleaning Responsibility: Primary Bill Paying/Finance Responsibility: Primary Shopping Responsibility: Primary Child Care Responsibility: No Current License: Yes Occupation: Unemployed Prior Function Level of Independence: Independent with basic ADLs, Independent with transfers, Independent with homemaking with ambulation, Independent with gait  Able to Take Stairs?: Yes Driving: Yes Vocation: Unemployed Vision Baseline Vision/History: 1 Wears glasses Ability to See in Adequate Light: 1 Impaired Patient Visual Report: Blurring of vision Vision Assessment?: Vision impaired- to be further tested in functional context;Wears glasses for reading Additional Comments: says current prescription does not help with vision Perception  Perception: Impaired Praxis Praxis: WFL Cognition Cognition Overall Cognitive Status: No family/caregiver present to determine baseline cognitive functioning Arousal/Alertness: Awake/alert Orientation Level: Person;Place;Situation Person: Oriented Place: Oriented Situation: Oriented Memory: Impaired Memory Impairment: Decreased recall of new information Sustained Attention: Appears intact Awareness: Impaired Awareness Impairment: Emergent impairment;Anticipatory impairment Problem Solving: Impaired Problem Solving Impairment: Functional complex;Verbal complex Executive Function: Initiating;Decision Making Decision Making: Impaired Decision Making Impairment: Functional basic Initiating:  Impaired Initiating Impairment: Functional basic Safety/Judgment: Impaired Brief Interview for Mental Status (BIMS) Repetition of Three Words (First Attempt): 3 Temporal Orientation: Year: Correct Temporal Orientation: Month: Accurate within 5 days Temporal Orientation: Day: Correct Recall: "Sock": Yes, no cue required Recall: "Blue": Yes, no cue required Recall: "Bed": Yes, no cue required BIMS Summary Score: 15 Sensation Sensation Light Touch: Impaired by gross assessment Hot/Cold: Appears Intact Proprioception: Impaired by gross assessment Coordination Gross Motor Movements are Fluid and Coordinated: No Fine Motor Movements are Fluid and Coordinated: No Coordination and Movement Description: LUE flaccid, weak LLE Motor  Motor Motor: Hemiplegia Motor - Skilled Clinical Observations: Lt side  Trunk/Postural Assessment  Cervical Assessment Cervical Assessment: Exceptions to [facility] (forward head) Thoracic Assessment Thoracic Assessment: Exceptions to [facility] (rounded shoulders) Lumbar Assessment Lumbar Assessment: Exceptions to [facility] (posterior pelvic tilt) Postural Control Postural Control: Deficits on evaluation (delayed, Lt lean)  Balance Balance Balance Assessed: Yes Static Sitting Balance Static Sitting - Balance Support: Feet unsupported;Right upper extremity supported Static Sitting -  Level of Assistance: 4: Min assist Dynamic Sitting Balance Dynamic Sitting - Balance Support: During functional activity Dynamic Sitting - Level of Assistance: 4: Min assist Dynamic Sitting - Balance Activities: Lateral lean/weight shifting;Forward lean/weight shifting;Reaching for objects;Reaching across midline Static Standing Balance Static Standing - Balance Support: During functional activity Static Standing - Level of Assistance: 3: Mod assist Dynamic Standing Balance Dynamic Standing - Balance Support: During functional activity Dynamic Standing - Level of Assistance: 3: Mod  assist Dynamic Standing - Balance Activities: Reaching for objects Extremity/Trunk Assessment RUE Assessment RUE Assessment: Within Functional Limits LUE Assessment LUE Assessment: Exceptions to [facility] Passive Range of Motion (PROM) Comments: WFL Active Range of Motion (AROM) Comments: flaccidity General Strength Comments: 0/5  Care Tool Care Tool Self Care Eating   Eating Assist Level: Moderate Assistance - Patient 50 - 74%    Oral Care    Oral Care Assist Level: Moderate Assistance - Patient 50 - 74%    Bathing         Assist Level: Maximal Assistance - Patient 24 - 49%    Upper Body Dressing(including orthotics)   What is the patient wearing?: Pull over shirt   Assist Level: Maximal Assistance - Patient 25 - 49%    Lower Body Dressing (excluding footwear)     Assist for lower body dressing: Maximal Assistance - Patient 25 - 49%    Putting on/Taking off footwear     Assist for footwear: Dependent - Patient 0%       Care Tool Toileting Toileting activity   Assist for toileting: Total Assistance - Patient < 25%     Care Tool Bed Mobility Roll left and right activity   Roll left and right assist level: Minimal Assistance - Patient > 75%    Sit to lying activity   Sit to lying assist level: Minimal Assistance - Patient > 75%    Lying to sitting on side of bed activity   Lying to sitting on side of bed assist level: the ability to move from lying on the back to sitting on the side of the bed with no back support.: Minimal Assistance - Patient > 75%     Care Tool Transfers Sit to stand transfer   Sit to stand assist level: Maximal Assistance - Patient 25 - 49%    Chair/bed transfer   Chair/bed transfer assist level: Maximal Assistance - Patient 25 - 49%     Toilet transfer   Assist Level: Maximal Assistance - Patient 24 - 49%     Care Tool Cognition  Expression of Ideas and Wants Expression of Ideas and Wants: 2. Frequent difficulty - frequently exhibits  difficulty with expressing needs and ideas  Understanding Verbal and Non-Verbal Content Understanding Verbal and Non-Verbal Content: 3. Usually understands - understands most conversations, but misses some part/intent of message. Requires cues at times to understand   Memory/Recall Ability Memory/Recall Ability : Current season;Location of own room;That he or she is in a hospital/hospital unit   Refer to Care Plan for Long Term Goals  SHORT TERM GOAL WEEK 1 OT Short Term Goal 1 (Week 1): Pt will complete PROM with LUE without VC OT Short Term Goal 2 (Week 1): Pt will use LUE as stabilizer during ADL tasks with Mod A OT Short Term Goal 3 (Week 1): Pt will complete shower transfer at Mod A with LRAD OT Short Term Goal 4 (Week 1): Pt will compplete UB dressing with himi technique at Mod A  Recommendations for  other services: Therapeutic Recreation  Pet therapy   Skilled Therapeutic Intervention ADL ADL Eating: Supervision/safety;Set up Where Assessed-Eating: Bed level (HOB raised) Grooming: Moderate assistance Where Assessed-Grooming: Standing at sink;Sitting at sink;Wheelchair Upper Body Bathing: Moderate assistance Where Assessed-Upper Body Bathing: Sitting at sink Lower Body Bathing: Moderate assistance Where Assessed-Lower Body Bathing: Standing at sink;Sitting at sink Upper Body Dressing: Maximal assistance Where Assessed-Upper Body Dressing: Wheelchair Lower Body Dressing: Maximal assistance Where Assessed-Lower Body Dressing: Bed level;Wheelchair Toileting: Maximal assistance Where Assessed-Toileting: Toilet;Bedside Commode Toilet Transfer: Moderate assistance Toilet Transfer Method: Stand pivot;Squat pivot Toilet Transfer Equipment: Gaffer: Not assessed Tub/Shower Transfer Method: Unable to assess Film/video editor: Unable to assess [employer] Method: Unable to assess ADL Comments: increased fatigue with ADL tasks, attempting  to incorporate LUE into ADLs but unable, presents with severe dysarthria and has dentures on top teeth Mobility  Bed Mobility Bed Mobility: Rolling Right;Rolling Left;Sit to Supine;Supine to Sit Rolling Right: Minimal Assistance - Patient > 75% Rolling Left: Minimal Assistance - Patient > 75% Supine to Sit: Minimal Assistance - Patient > 75% Sit to Supine: Minimal Assistance - Patient > 75% Transfers Sit to Stand: Moderate Assistance - Patient 50-74% Stand to Sit: Moderate Assistance - Patient 50-74%   1:1 evaluation and treatment session initiated this date. OT roles, goals and purpose discussed with pt as well as therapy schedule. ADL completed this date with levels of assist listed above. Pt presenting with severe dysarthria, although able to communicate needs during ADL tasks. OT demonstrating PROM exercises for LUE in order to prevent muscle tightness. OT messaged MD for LUE resting hand splint for nighttime wear to prevent contractures of hand. Pt would benefit from skilled OT in IPR setting in order to maximize independence with ADLs upon D/C.    Discharge Criteria: Patient will be discharged from OT if patient refuses treatment 3 consecutive times without medical reason, if treatment goals not met, if there is a change in medical status, if patient makes no progress towards goals or if patient is discharged from hospital.  The above assessment, treatment plan, treatment alternatives and goals were discussed and mutually agreed upon: by patient  Velia Meyer, OTD, OTR/L 02/10/2024, 12:39 PM

## 2024-02-10 NOTE — Progress Notes (Incomplete)
 Patient ID: [name], female   DOB: 05/27/1966, 58 y.o.   MRN: [id number]

## 2024-02-10 NOTE — Progress Notes (Addendum)
 Inpatient Rehabilitation Admission Medication Review by a Pharmacist  A complete drug regimen review was completed for this patient to identify any potential clinically significant medication issues.  High Risk Drug Classes Is patient taking? Indication by Medication  Antipsychotic No   Anticoagulant Yes Enoxaparin - VTE prophylaxis  Antibiotic Yes Nystatin oral - dysphagia  Opioid No   Antiplatelet Yes Aspirin 81 mg and Clopidogrel x 3 weeks, then Clopidogrel alone (Aspirin begun 3/18) - CVA prophylaxis  Hypoglycemics/insulin No   Vasoactive Medication No   Chemotherapy No   Other Yes Levothyroxine - hypothyroidism Rosuvastatin - hyperlipidemia Miralax, senna-docusate - laxatives  PRNs: Acetaminophen - mild pain or temp > 99.33F Bisacodyl PR, Fleets enema - constipation     Type of Medication Issue Identified Description of Issue Recommendation(s)  Drug Interaction(s) (clinically significant)     Duplicate Therapy     Allergy     No Medication Administration End Date  Aspirin 81 mg and Clopidogrel planned x 3 weeks, then Clopidogrel alone. Aspirin begun 02/03/24 while inpatient.  Nystatin oral planned x 14 days per discharge summary. Begun 02/08/24. Stop aspirin after 02/23/24 dose.    Nystatin thru 02/22/24 or as clinically warranted.  Incorrect Dose     Additional Drug Therapy Needed     Significant med changes from prior encounter (inform family/care partners about these prior to discharge). New aspirin 81 mg x 3 weeks. New Nystatin oral, laxatives. Communicate changes with patient/family prior to discharge.  Other       Clinically significant medication issues were identified that warrant physician communication and completion of prescribed/recommended actions by midnight of the next day:  No  Pharmacist comments:  - Aspirin 81 mg and Clopidogrel planned x 3 weeks, then Clopidogrel alone. Aspirin begun 02/03/24 while inpatient > last day 02/23/24. - Nystatin oral  begun 3/23 > planned x 14 days per discharge summary.  Time spent performing this drug regimen review (minutes):  [address]   Nicolette Bang [place], [place] 02/10/2024 9:22 AM

## 2024-02-10 NOTE — Progress Notes (Signed)
 Physical Therapy Assessment and Plan  Patient Details  Name: Theresa Watkins MRN: [id number] Date of Birth: [birth year]  PT Diagnosis: Abnormality of gait, Hemiplegia non-dominant, and Hypotonia Rehab Potential: Fair ELOS: 3-4 weeks   Today's Date: 02/10/2024 PT Individual Time: 1300-1420 PT Individual Time Calculation (min): 80 min    Hospital Problem: Principal Problem:   Right middle cerebral artery stroke [facility])   Past Medical History:  Past Medical History:  Diagnosis Date   Abnormal Pap smear    Bronchitis    Fibroid    Headache(784.0)    Ovarian cyst    Plantar fasciitis    Seizures (HCC)    Stroke (HCC)    Thyroid cancer (HCC) 20011   Trichomonas    Urinary tract infection    Past Surgical History:  Past Surgical History:  Procedure Laterality Date   ABDOMINAL HYSTERECTOMY     CESAREAN SECTION     goiter     removed   laparoscopic  1988   removal  of ectopic preg, ruptured tube   THERAPEUTIC ABORTION     THYROIDECTOMY  march 2011   cancer    Assessment & Plan Clinical Impression: Patient is a 58 year old right-handed female with history of hypertension, hyperlipidemia, hypothyroidism, cocaine/polysubstance abuse as well as tobacco use. Per chart review patient lives alone. Reportedly used a straight point cane prior to admission. She does have family in the area. Presented 02/02/2024 with aphasia as well as left-sided weakness after being found down by her son. CT/MRI showed several small acute to subacute lacunar infarcts in the lateral right thalamus, posterior limb right external capsule. No associated hemorrhage or mass effect. Underlying extensive chronic small vessel disease. 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 MRA negative for large vessel occlusion. New since 2023 severe left PCA P2 segment stenosis. 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 Patient did not receive TNK. Admission chemistries unremarkable except potassium 3.4, urine drug screen positive cocaine, hemoglobin A1c 5.8. Echocardiogram ejection fraction of 60 to 65% no wall motion abnormalities. Presently maintained on aspirin 81 mg daily and Plavix 75 mg daily for CVA prophylaxis x 3 weeks then Plavix alone. Lovenox added for DVT prophylaxis. Presently on dysphagia #2 thin liquid diet. Therapy evaluations completed due to patient decreased functional mobility and left-sided weakness was admitted for a comprehensive rehab program   Patient currently requires max with mobility secondary to muscle weakness, abnormal tone and ataxia, decreased visual acuity and hemianopsia, and decreased standing balance, decreased postural control, hemiplegia, and decreased balance strategies.  Prior to hospitalization, patient was independent  with mobility and lived with Alone in a Apartment home.  Home access is 2Stairs to enter.  Patient will benefit from skilled PT intervention to maximize safe functional mobility, minimize fall risk, and decrease caregiver burden for planned discharge home with intermittent assist.  Anticipate patient will benefit from follow up OP at discharge.  PT - End of Session Activity Tolerance: Tolerates 10 - 20 min activity with multiple rests Endurance Deficit: Yes PT Assessment Rehab Potential (ACUTE/IP ONLY): Fair PT Barriers to Discharge: Decreased caregiver support;Home environment access/layout PT Barriers to Discharge Comments: narrow doorways, 2 STE w/o HR PT Patient demonstrates impairments in the following area(s): Balance;Perception;Sensory;Endurance;Motor PT Transfers Functional Problem(s): Bed Mobility;Bed to Chair;Car PT Locomotion Functional Problem(s): Ambulation;Wheelchair Mobility;Stairs PT Plan PT Intensity: Minimum of 1-2 x/day ,45 to 90 minutes PT Frequency:  5 out of 7 days PT Duration Estimated Length of Stay: 3-4 weeks PT  Treatment/Interventions: Ambulation/gait training;Community reintegration;DME/adaptive equipment instruction;Neuromuscular re-education;Stair training;UE/LE Strength taining/ROM;Wheelchair propulsion/positioning;Balance/vestibular training;Discharge planning;Functional electrical stimulation;Pain management;Therapeutic Activities;UE/LE Coordination activities;Cognitive remediation/compensation;Disease management/prevention;Functional mobility training;Patient/family education;Splinting/orthotics;Therapeutic Exercise;Visual/perceptual remediation/compensation PT Transfers Anticipated Outcome(s): supervision PT Locomotion Anticipated Outcome(s): minA ambulation, mod(I) w/c PT Recommendation Recommendations for Other Services: None Follow Up Recommendations: Outpatient PT Patient destination: Home Equipment Recommended: To be determined Equipment Details: TBD   PT Evaluation Precautions/Restrictions Precautions Precautions: Fall Precaution/Restrictions Comments: L hemi Restrictions Weight Bearing Restrictions Per Provider Order: No General Chart Reviewed: Yes Vital SignsTherapy Vitals Temp: 98.7 F (37.1 C) Pulse Rate: 69 Resp: 17 BP: (!) 130/90 Patient Position (if appropriate): Sitting Oxygen Therapy SpO2: 97 % O2 Device: Room Air Pain Pain Assessment Pain Scale: 0-10 Pain Score: 0-No pain Pain Interference Pain Interference Pain Effect on Sleep: 4. Almost constantly Pain Interference with Therapy Activities: 2. Occasionally Pain Interference with Day-to-Day Activities: 4. Almost constantly Home Living/Prior Functioning Home Living Available Help at Discharge: Family;Available PRN/intermittently Type of Home: Apartment Home Access: Stairs to enter Entrance Stairs-Number of Steps: 2 Entrance Stairs-Rails: None Home Layout: One level Bathroom Shower/Tub: Tub/shower unit;Other (comment) (has shower chair) Bathroom Accessibility: No Additional Comments: son states doorway  to bathroom not wide enough for RW, has 2 cats at home  Lives With: Alone Prior Function Level of Independence: Independent with basic ADLs;Independent with transfers;Independent with homemaking with ambulation;Independent with gait  Able to Take Stairs?: Yes Driving: No Vocation: On disability Leisure: Hobbies-yes (Comment) (cooking) Vision/Perception  Vision - History Ability to See in Adequate Light: 1 Impaired Vision - Assessment Additional Comments: says she cannot see anything through her current prescription  Cognition Overall Cognitive Status: No family/caregiver present to determine baseline cognitive functioning Arousal/Alertness: Awake/alert Orientation Level: Oriented X4 Year: 2025 Month: March Day of Week: Correct Attention: Sustained;Selective Sustained Attention: Appears intact Selective Attention: Impaired Selective Attention Impairment: Verbal basic;Functional basic Memory: Impaired Memory Impairment: Decreased recall of new information Awareness: Impaired Awareness Impairment: Emergent impairment;Anticipatory impairment Problem Solving: Appears intact Problem Solving Impairment: Functional complex;Verbal complex Executive Function: Initiating;Decision Making Decision Making: Impaired Decision Making Impairment: Functional basic Initiating: Impaired Initiating Impairment: Verbal complex Safety/Judgment: Appears intact Sensation Sensation Light Touch: Impaired by gross assessment Additional Comments: sensation to L upper thigh >R, R>L medial knee, L>R dorsum toes, all other LE sensation same side to side and WNL Coordination Gross Motor Movements are Fluid and Coordinated: No Fine Motor Movements are Fluid and Coordinated: No Coordination and Movement Description: LUE flaccid, weak LLE Motor  Motor Motor: Hemiplegia Motor - Skilled Clinical Observations: Lt side, UE>LE   Trunk/Postural Assessment  Cervical Assessment Cervical Assessment: Within  Functional Limits Thoracic Assessment Thoracic Assessment: Within Functional Limits Lumbar Assessment Lumbar Assessment: Within Functional Limits Postural Control Postural Control: Deficits on evaluation (left and posterior lean d/t hemiplegia)  Balance Balance Balance Assessed: Yes Static Sitting Balance Static Sitting - Balance Support: Feet unsupported;Right upper extremity supported Static Sitting - Level of Assistance: 4: Min assist Dynamic Sitting Balance Dynamic Sitting - Balance Support: During functional activity Dynamic Sitting - Level of Assistance: 4: Min assist Dynamic Sitting - Balance Activities: Lateral lean/weight shifting;Forward lean/weight shifting;Reaching for objects Sitting balance - Comments: benefits from verbal cues for upright while sitting EOB Static Standing Balance Static Standing - Balance Support: During functional activity Static Standing - Level of Assistance: 3: Mod assist Dynamic Standing Balance Dynamic Standing - Balance Support: During functional activity Dynamic Standing - Level of Assistance:  2: Max assist Extremity Assessment      RLE Assessment RLE Assessment: Within Functional Limits LLE Assessment LLE Assessment: Exceptions to [facility] LLE Strength Left Hip Flexion: 3+/5 Left Knee Flexion: 3+/5 Left Knee Extension: 3/5 Left Ankle Dorsiflexion: 2+/5 Left Ankle Plantar Flexion: 2+/5  Care Tool Care Tool Bed Mobility Roll left and right activity   Roll left and right assist level: Minimal Assistance - Patient > 75%    Sit to lying activity   Sit to lying assist level: Minimal Assistance - Patient > 75%    Lying to sitting on side of bed activity   Lying to sitting on side of bed assist level: the ability to move from lying on the back to sitting on the side of the bed with no back support.: Minimal Assistance - Patient > 75%     Care Tool Transfers Sit to stand transfer   Sit to stand assist level: Moderate Assistance - Patient  50 - 74% (with R HR pulling to stand)    Chair/bed transfer   Chair/bed transfer assist level: Minimal Assistance - Patient > 75% (squat pivot)    Licensed conveyancer transfer activity did not occur: Safety/medical concerns (L hemiplegia and pt fatigue)        Care Tool Locomotion Ambulation   Assist level: 2 helpers (max a +w/c follow) Assistive device: Other (comment) (R hand rail) Max distance: 10  Walk 10 feet activity   Assist level: 2 helpers (max a +w/c follow) Assistive device: Other (comment) (R hand rail)   Walk 50 feet with 2 turns activity Walk 50 feet with 2 turns activity did not occur: Safety/medical concerns (L hemiplegia and pt fatigue)      Walk 150 feet activity Walk 150 feet activity did not occur: Safety/medical concerns (L hemiplegia and pt fatigue)      Walk 10 feet on uneven surfaces activity Walk 10 feet on uneven surfaces activity did not occur: Safety/medical concerns (L hemiplegia and pt fatigue)      Stairs Stair activity did not occur: Safety/medical concerns (L hemiplegia and pt fatigue)        Walk up/down 1 step activity Walk up/down 1 step or curb (drop down) activity did not occur: Safety/medical concerns (L hemiplegia and pt fatigue)      Walk up/down 4 steps activity Walk up/down 4 steps activity did not occur: Safety/medical concerns (L hemiplegia and pt fatigue)      Walk up/down 12 steps activity Walk up/down 12 steps activity did not occur: Safety/medical concerns (L hemiplegia and pt fatigue)      Pick up small objects from floor Pick up small object from the floor (from standing position) activity did not occur: Safety/medical concerns (L hemiplegia and pt fatigue)      Wheelchair Is the patient using a wheelchair?: Yes Type of Wheelchair: Manual   Wheelchair assist level: Dependent - Patient 0%    Wheel 50 feet with 2 turns activity   Assist Level: Dependent - Patient 0%  Wheel 150 feet activity   Assist Level: Dependent -  Patient 0%    Refer to Care Plan for Long Term Goals  SHORT TERM GOAL WEEK 1 PT Short Term Goal 1 (Week 1): Pt will perform squat pivot tx with CGA bed<>chair PT Short Term Goal 2 (Week 1): Pt will ambulate 69' with maxA and LRAD PT Short Term Goal 3 (Week 1): Pt will propel w/c 150' with SBA  Recommendations for other services: None  Skilled Therapeutic Intervention Mobility Bed Mobility Bed Mobility: Rolling Right;Rolling Left;Sit to Supine;Supine to Sit Rolling Right: Minimal Assistance - Patient > 75% Rolling Left: Minimal Assistance - Patient > 75% Supine to Sit: Minimal Assistance - Patient > 75% Sit to Supine: Minimal Assistance - Patient > 75% Transfers Transfers: Sit to Stand;Stand to Sit;Squat Pivot Transfers Sit to Stand: Minimal Assistance - Patient > 75% Stand to Sit: Minimal Assistance - Patient > 75% Squat Pivot Transfers: Minimal Assistance - Patient > 75%;2 Advertising account planner (Assistive device): None Locomotion  Gait Ambulation: Yes Gait Assistance: Maximal Assistance - Patient 25-49% Gait Distance (Feet): 10 Feet Assistive device: Other (Comment) (R hand rail) Gait Assistance Details: Manual facilitation for weight bearing;Manual facilitation for weight shifting;Manual facilitation for placement;Verbal cues for technique;Verbal cues for precautions/safety;Verbal cues for gait pattern Gait Assistance Details: L HR Gait Gait: Yes Gait Pattern: Decreased step length - left;Decreased stance time - left;Poor foot clearance - left;Decreased weight shift to left;Decreased dorsiflexion - left;Step-to pattern;Left flexed knee in stance Gait velocity: decr Stairs / Additional Locomotion Stairs: No Ramp: Dependent - Patient 0% Curb: Dependent - Patient 0% Wheelchair Mobility Wheelchair Mobility: Yes Wheelchair Assistance: Dependent - Patient 0%  Evaluation completed (see details above) with patient education regarding purpose of PT evaluation, PT POC and goals,  therapy schedule, weekly team meetings, and other CIR information including safety plan and fall risk safety. Pt performed the below functional mobility tasks with the specified levels of skilled cuing and assistance.    Pt received supine in bed with son and mother present, agreeable to therapy evaluation. Pt had no c/o pain.   Pt performed supine<>sit with minA and reported difficulty breathing in supine with HOB flat - vitals: 124/93 (100)mmHg, 69bpm, 95% SpO2. Pt also stated some dizziness in sitting EOB - discussed this could be d/t visual field changes. MinA dynamic sitting balance, verbal cues/reminders to move to midline (L lean tendency)  Pt performed squat pivot tx to w/c with minA, verbal cue given to notify PT/SPT in advance to be able to better assist her. Pt dependently transported to hallway for gait assessment.   Gait training at R HR: see gait pattern and cueing described above. 10' with maxA for L knee buckling and L hip extension in stance, LLE advancement in swing. Pt noted significant fatigue and feeling of L knee hyperextension, immediately sunk into hip flexion when in L stance each time. Pt dependently transported in w/c back to room and left seated in w/c with all needs in reach and seat belt alarm on.    Discharge Criteria: Patient will be discharged from PT if patient refuses treatment 3 consecutive times without medical reason, if treatment goals not met, if there is a change in medical status, if patient makes no progress towards goals or if patient is discharged from hospital.  The above assessment, treatment plan, treatment alternatives and goals were discussed and mutually agreed upon: by patient  Collins Scotland 02/10/2024, 4:30 PM

## 2024-02-10 NOTE — Progress Notes (Signed)
 NT reported to nurse that patient was choking during meal at lunch time. Tray was removed, mouth suction was given. When NT try to offered food again to patient, patient refused.

## 2024-02-10 NOTE — Progress Notes (Signed)
 Patient was offered PRN fleets enema but she refused and did agree to have miralax and dulcolax supp. She did have a large soft BM approximately 45 minutes after the suppository. She insisted to get up to [facility] to have BM and required 2 person to stand pivot to [facility]. She was assisted back to bed and positioned for comfort. She has slept well this shift. No concerns at this time.

## 2024-02-10 NOTE — Plan of Care (Signed)
  Problem: Consults Goal: RH STROKE PATIENT EDUCATION Description: See Patient Education module for education specifics  Outcome: Progressing   Problem: RH BOWEL ELIMINATION Goal: RH STG MANAGE BOWEL WITH ASSISTANCE Description: STG Manage Bowel with toileting Assistance. Outcome: Progressing Goal: RH STG MANAGE BOWEL W/MEDICATION W/ASSISTANCE Description: STG Manage Bowel with Medication with mod I Assistance. Outcome: Progressing   Problem: RH SAFETY Goal: RH STG ADHERE TO SAFETY PRECAUTIONS W/ASSISTANCE/DEVICE Description: STG Adhere to Safety Precautions With cues Assistance/Device. Outcome: Progressing   Problem: RH PAIN MANAGEMENT Goal: RH STG PAIN MANAGED AT OR BELOW PT'S PAIN GOAL Description: Pain managed < 4 with prns Outcome: Progressing   Problem: RH KNOWLEDGE DEFICIT Goal: RH STG INCREASE KNOWLEDGE OF HYPERTENSION Description: Patient and mother will be able to manage HTN using educational resources for medications and dietary modification independently Outcome: Progressing Goal: RH STG INCREASE KNOWLEDGE OF DYSPHAGIA/FLUID INTAKE Description: Patient and mother will be able to manage dysphagia using educational resources for medications and dietary modification independently Outcome: Progressing Goal: RH STG INCREASE KNOWLEGDE OF HYPERLIPIDEMIA Description: Patient and mother will be able to manage HLD using educational resources for medications and dietary modification independently Outcome: Progressing Goal: RH STG INCREASE KNOWLEDGE OF STROKE PROPHYLAXIS Description: Patient and mother will be able to manage secondary risks using educational resources for medications and dietary modification independently Outcome: Progressing   Problem: Consults Goal: RH GENERAL PATIENT EDUCATION Description: See Patient Education module for education specifics. Outcome: Progressing Goal: Skin Care Protocol Initiated - if Braden Score 18 or less Description: If consults are  not indicated, leave blank or document N/A Outcome: Progressing Goal: Nutrition Consult-if indicated Outcome: Progressing Goal: Diabetes Guidelines if Diabetic/Glucose > 140 Description: If diabetic or lab glucose is > 140 mg/dl - Initiate Diabetes/Hyperglycemia Guidelines & Document Interventions  Outcome: Progressing   Problem: RH BOWEL ELIMINATION Goal: RH STG MANAGE BOWEL WITH ASSISTANCE Description: STG Manage Bowel with Assistance. Outcome: Progressing Goal: RH STG MANAGE BOWEL W/MEDICATION W/ASSISTANCE Description: STG Manage Bowel with Medication with Assistance. Outcome: Progressing   Problem: RH BLADDER ELIMINATION Goal: RH STG MANAGE BLADDER WITH ASSISTANCE Description: STG Manage Bladder With Assistance Outcome: Progressing Goal: RH STG MANAGE BLADDER WITH MEDICATION WITH ASSISTANCE Description: STG Manage Bladder With Medication With Assistance. Outcome: Progressing Goal: RH STG MANAGE BLADDER WITH EQUIPMENT WITH ASSISTANCE Description: STG Manage Bladder With Equipment With Assistance Outcome: Progressing   Problem: RH SKIN INTEGRITY Goal: RH STG SKIN FREE OF INFECTION/BREAKDOWN Outcome: Progressing Goal: RH STG MAINTAIN SKIN INTEGRITY WITH ASSISTANCE Description: STG Maintain Skin Integrity With Assistance. Outcome: Progressing Goal: RH STG ABLE TO PERFORM INCISION/WOUND CARE W/ASSISTANCE Description: STG Able To Perform Incision/Wound Care With Assistance. Outcome: Progressing   Problem: RH KNOWLEDGE DEFICIT GENERAL Goal: RH STG INCREASE KNOWLEDGE OF SELF CARE AFTER HOSPITALIZATION Outcome: Progressing

## 2024-02-10 NOTE — Plan of Care (Signed)
  Problem: RH Swallowing Goal: LTG Patient will consume least restrictive diet using compensatory strategies with assistance (SLP) Description: LTG:  Patient will consume least restrictive diet using compensatory strategies with assistance (SLP) Flowsheets (Taken 02/10/2024 1624) LTG: Pt Patient will consume least restrictive diet using compensatory strategies with assistance of (SLP): Minimal Assistance - Patient > 75%   Problem: RH Expression Communication Goal: LTG Patient will increase speech intelligibility (SLP) Description: LTG: Patient will increase speech intelligibility at word/phrase/conversation level with cues, % of the time (SLP) Flowsheets (Taken 02/10/2024 1624) LTG: Patient will increase speech intelligibility (SLP): Minimal Assistance - Patient > 75% Level: (sentence) -- Percent of time patient will use intelligible speech: 90%

## 2024-02-10 NOTE — Discharge Instructions (Addendum)
 Inpatient Rehab Discharge Instructions  Theresa Watkins Discharge date and time: No discharge date for patient encounter.   Activities/Precautions/ Functional Status: Activity: activity as tolerated Diet: Regular Wound Care: Routine skin checks Functional status:  ___ No restrictions     ___ Walk up steps independently ___ 24/7 supervision/assistance   ___ Walk up steps with assistance ___ Intermittent supervision/assistance  ___ Bathe/dress independently ___ Walk with walker     _x__ Bathe/dress with assistance ___ Walk Independently    ___ Shower independently ___ Walk with assistance    ___ Shower with assistance ___ No alcohol      ___ Return to work/school ________  Special Instructions: No driving smoking or alcoholSTROKE/TIA DISCHARGE INSTRUCTIONS SMOKING Cigarette smoking nearly doubles your risk of having a stroke & is the single most alterable risk factor  If you smoke or have smoked in the last 12 months, you are advised to quit smoking for your health. Most of the excess cardiovascular risk related to smoking disappears within a year of stopping. Ask you doctor about anti-smoking medications [place] Quit Line: 1-800-QUIT NOW Free Smoking Cessation Classes [phone]  CHOLESTEROL Know your levels; limit fat & cholesterol in your diet  Lipid Panel     Component Value Date/Time   CHOL 164 02/03/2024 0552   CHOL 157 04/17/2023 1658   TRIG 105 02/03/2024 0552   HDL 53 02/03/2024 0552   HDL 60 04/17/2023 1658   CHOLHDL 3.1 02/03/2024 0552   VLDL 21 02/03/2024 0552   LDLCALC 90 02/03/2024 0552   LDLCALC 84 04/17/2023 1658     Many patients benefit from treatment even if their cholesterol is at goal. Goal: Total Cholesterol (CHOL) less than 160 Goal:  Triglycerides (TRIG) less than 150 Goal:  HDL greater than 40 Goal:  LDL (LDLCALC) less than 100   BLOOD PRESSURE American Stroke Association blood pressure target is less that 120/80 mm/Hg  Your discharge blood  pressure is:  BP: 110/69 Monitor your blood pressure Limit your salt and alcohol  intake Many individuals will require more than one medication for high blood pressure  DIABETES (A1c is a blood sugar average for last 3 months) Goal HGBA1c is under 7% (HBGA1c is blood sugar average for last 3 months)  Diabetes: No known diagnosis of diabetes    Lab Results  Component Value Date   HGBA1C 5.8 (H) 02/03/2024    Your HGBA1c can be lowered with medications, healthy diet, and exercise. Check your blood sugar as directed by your physician Call your physician if you experience unexplained or low blood sugars.  PHYSICAL ACTIVITY/REHABILITATION Goal is 30 minutes at least 4 days per week  Activity: Increase activity slowly, Therapies: Physical Therapy: Home Health Return to work:  Activity decreases your risk of heart attack and stroke and makes your heart stronger.  It helps control your weight and blood pressure; helps you relax and can improve your mood. Participate in a regular exercise program. Talk with your doctor about the best form of exercise for you (dancing, walking, swimming, cycling).  DIET/WEIGHT Goal is to maintain a healthy weight  Your discharge diet is:  Diet Order             DIET DYS 2 Room service appropriate? Yes with Assist; Fluid consistency: Thin  Diet effective now                   liquids Your height is:  Height: 5\' 6"  (167.6 cm) Your current weight is: Weight: 64.4  kg Your Body Mass Index (BMI) is:  BMI (Calculated): 22.93 Following the type of diet specifically designed for you will help prevent another stroke. Your goal weight range is:   Your goal Body Mass Index (BMI) is 19-24. Healthy food habits can help reduce 3 risk factors for stroke:  High cholesterol, hypertension, and excess weight.  RESOURCES Stroke/Support Group:  Call [phone]   STROKE EDUCATION PROVIDED/REVIEWED AND GIVEN TO PATIENT Stroke warning signs and symptoms How to activate  emergency medical system (call 911). Medications prescribed at discharge. Need for follow-up after discharge. Personal risk factors for stroke. Pneumonia vaccine given: No Flu vaccine given: No My questions have been answered, the writing is legible, and I understand these instructions.  I will adhere to these goals & educational materials that have been provided to me after my discharge from the hospital.      My questions have been answered and I understand these instructions. I will adhere to these goals and the provided educational materials after my discharge from the hospital.  Patient/Caregiver Signature _______________________________ Date __________  Clinician Signature _______________________________________ Date __________  Please bring this form and your medication list with you to all your follow-up doctor's appointments.

## 2024-02-10 NOTE — Care Management (Signed)
 Inpatient Rehabilitation Center Individual Statement of Services  Patient Name:  Theresa Watkins  Date:  02/10/2024  Welcome to the Inpatient Rehabilitation Center.  Our goal is to provide you with an individualized program based on your diagnosis and situation, designed to meet your specific needs.  With this comprehensive rehabilitation program, you will be expected to participate in at least 3 hours of rehabilitation therapies Monday-Friday, with modified therapy programming on the weekends.  Your rehabilitation program will include the following services:  Physical Therapy (PT), Occupational Therapy (OT), Speech Therapy (ST), 24 hour per day rehabilitation nursing, Therapeutic Recreaction (TR), Psychology, Neuropsychology, Care Coordinator, Rehabilitation Medicine, Nutrition Services, Pharmacy Services, and Other  Weekly team conferences will be held on Tuesdays to discuss your progress.  Your Inpatient Rehabilitation Care Coordinator will talk with you frequently to get your input and to update you on team discussions.  Team conferences with you and your family in attendance may also be held.  Expected length of stay: 3-4 weeks    Overall anticipated outcome: Minimal Assistance  Depending on your progress and recovery, your program may change. Your Inpatient Rehabilitation Care Coordinator will coordinate services and will keep you informed of any changes. Your Inpatient Rehabilitation Care Coordinator's name and contact numbers are listed  below.  The following services may also be recommended but are not provided by the Inpatient Rehabilitation Center:  Driving Evaluations Home Health Rehabiltiation Services Outpatient Rehabilitation Services Vocational Rehabilitation   Arrangements will be made to provide these services after discharge if needed.  Arrangements include referral to agencies that provide these services.  Your insurance has been verified to be:  [place] [employer] Healthy  Blue  Your primary doctor is:  Jonah Blue  Pertinent information will be shared with your doctor and your insurance company.  Inpatient Rehabilitation Care Coordinator:  Susie Cassette [phone] or ([phone]  Information discussed with and copy given to patient by: Gretchen Short, 02/10/2024, 10:20 PM

## 2024-02-10 NOTE — Patient Care Conference (Signed)
 Inpatient RehabilitationTeam Conference and Plan of Care Update Date: 02/10/2024   Time: 1110 am     Patient Name: Theresa Watkins      Medical Record Number: [id number]  Date of Birth: 07-24-1966 Sex: Female         Room/Bed: 4W05C/4W05C-01 Payor Info: Payor: [place] MEDICAID PREPAID HEALTH PLAN / Plan: [place] MEDICAID HEALTHY BLUE / Product Type: *No Product type* /    Admit Date/Time:  02/09/2024  6:29 PM  Primary Diagnosis:  Right middle cerebral artery stroke [facility])  Hospital Problems: Principal Problem:   Right middle cerebral artery stroke [facility])    Expected Discharge Date: Expected Discharge Date:  (evals pending)  Team Members Present: Physician leading conference: Dr. Genice Rouge Social Worker Present: Cecile Sheerer, LCSWA Nurse Present: Konrad Dolores, RN PT Present: Bernie Covey, PT OT Present: Roney Mans, OT SLP Present: Other (comment) Alvera Novel , SLP)     Current Status/Progress Goal Weekly Team Focus  Bowel/Bladder   Patient is continent of Bowel and bladder. She had PRN dulcolax supp on 02/09/24 and had a large BM. She has PRN suppository, Enema and miralax now.  Bladder incontinence    Time toileting every 3 hours during the day and every 4 hours during the night    Maintain continence of bowel and bladder  Swallow/Nutrition/ Hydration   eval pending - Dys 2 textures/thin liquids           ADL's   Max all ADLs, LUE flaccid, Mod-Max stand and squat pivot transfers, would benefit from LUE resting hand splint   Min A-SBA   ADL retraining with hemi techniques, activity tolerance, LUE NMRE    Mobility   Eval pending   Eval pending  Eval pending    Communication   eval pending            Safety/Cognition/ Behavioral Observations  eval pending            Pain   Patient did report headache over night on 02/09/24. She was given PRN tylenol which was effective.      <4 w/ prns    <4 w/ prns  Skin   Patient skin is intact. No signs of  breakdown noted.     Skin free from any breakdown with daily skin check   Maintain Skin free from breakdown      Discharge Planning:  Pt will d/c to home with her mother that is not able to provide physical assistance. PRN support from her son that works. Will confirm there are no barriers to discharge.   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 Patient limited by left hemiparesis, severe left upper extremity sensory deficit , severe dysarthria, dysphagia and pain.  Patient on target to meet rehab goals: Evals pending  *See Care Plan and progress notes for long and short-term goals.   Revisions to Treatment Plan:  Resting hand splint    Teaching Needs: Safety, medications, toileting, transfers, polysubstance abuse education, smoking cessation education,. Etc.   Current Barriers to Discharge: Decreased caregiver support  Possible Resolutions to Barriers:  Family education     Medical Summary Current Status: some inconitnent of bladder- LBM yesterday- HA controlled with tylenol- Skin OK  Barriers to Discharge: Behavior/Mood;Medical stability;Weight bearing restrictions;Self-care education;Incontinence;Neurogenic Bowel & Bladder  Barriers to Discharge Comments: HA's- needs mod I- LUE flaccid; severe dysarthrita- eval pending SLP and PT Possible Resolutions to [employer] Focus: will get  WHO for Flaccid LUE- and pt at high risk for Spasticity- d/c- TBD   Continued Need for Acute Rehabilitation Level of Care: The patient requires daily medical management by a physician with specialized training in physical medicine and rehabilitation for the following reasons: Direction of a multidisciplinary physical rehabilitation program to maximize functional independence : Yes Medical management of patient stability for increased activity during participation in an intensive rehabilitation regime.:  Yes Analysis of laboratory values and/or radiology reports with any subsequent need for medication adjustment and/or medical intervention. : Yes   I attest that I was present, lead the team conference, and concur with the assessment and plan of the team.   Konrad Dolores [facility] 02/11/2024, 4:12 PM

## 2024-02-11 ENCOUNTER — Telehealth: Payer: Self-pay | Admitting: Internal Medicine

## 2024-02-11 DIAGNOSIS — I63511 Cerebral infarction due to unspecified occlusion or stenosis of right middle cerebral artery: Secondary | ICD-10-CM | POA: Diagnosis not present

## 2024-02-11 NOTE — Progress Notes (Signed)
 Occupational Therapy Session Note  Patient Details  Name: Theresa Watkins MRN: [id number] Date of Birth: Dec 09, 1965  Today's Date: 02/11/2024 OT Individual Time: 0930-1030 OT Individual Time Calculation (min): 60 min    Short Term Goals: Week 1:  OT Short Term Goal 1 (Week 1): Pt will complete PROM with LUE without VC OT Short Term Goal 2 (Week 1): Pt will use LUE as stabilizer during ADL tasks with Mod A OT Short Term Goal 3 (Week 1): Pt will complete shower transfer at Mod A with LRAD OT Short Term Goal 4 (Week 1): Pt will compplete UB dressing with himi technique at Mod A  Skilled Therapeutic Interventions/Progress Updates:    Pt resting in bed upon arrival. OT intervention with focus on bathing/dressing at bed level and seated EOB, sitting balance, functional transfers, and safety awareness to increase independence with BADLs. Pt able to bridge in bed to facilitate LB dressing at bed elvel. Bathing with mod A at bed level and seated EOB. Supine>sit EOB with min A. Pt demo active movement in LUE but not yet functional. UB dressing with max seated EOB with max verbal cues for hemi dressing techniques. Stand pivot transfer to recliner with max A and max tactile cues. Pt reamined in recliner with all needs wihtin reach. Belt alarm activated.   Therapy Documentation Precautions:  Precautions Precautions: Fall Precaution/Restrictions Comments: L hemi Restrictions Weight Bearing Restrictions Per Provider Order: No   Pain: Pt denies pain this afternoon   Therapy/Group: Individual Therapy  Rich Brave 02/11/2024, 12:18 PM

## 2024-02-11 NOTE — Progress Notes (Signed)
 Patient ID: Theresa Watkins, female   DOB: 11/22/1965, 58 y.o.   MRN: [id number]  48- SW spoke with pt mother Davetta to inform on ELOS. SW will follow-up after team conference.   Cecile Sheerer, MSW, LCSW Office: [phone] Cell: [phone] Fax: [phone]

## 2024-02-11 NOTE — Progress Notes (Signed)
 Physical Therapy Session Note  Patient Details  Name: Theresa Watkins MRN: [id number] Date of Birth: 01-12-1966  Today's Date: 02/11/2024 PT Individual Time: 1050-1200 PT Individual Time Calculation (min): 70 min   Short Term Goals: Week 1:  PT Short Term Goal 1 (Week 1): Pt will perform squat pivot tx with CGA bed<>chair PT Short Term Goal 2 (Week 1): Pt will ambulate 81' with maxA and LRAD PT Short Term Goal 3 (Week 1): Pt will propel w/c 150' with SBA  Skilled Therapeutic Interventions/Progress Updates: Pt presented in recliner agreeable to therapy. Pt c/o HA during session however no intervention requested. Pt endorses significant fatigue with PTA providing rest breaks throughout session. Session focused on transfers and L NMR. PTA obtained 16x16 w/c cushion to allow for improved tolerance in w/c. Pt completed squat pivot transfer to L with heavy modA. Pt transported to day room and completed squat pivot transfer to mat to R with minA. Pt then worked on lateral scoots incorporating hemi side with PTA stabilizing LUE and encouraging pt to push through BLE to complete lateral scoots. With mod multimodal cues pt was able to incorporate increased hip clearance and complete scoots with minA nearing CGA. Pt also worked on Sit to stand without AD for increased recruitment of LLE. With pt  holding onto therapist's shoulder with RUE pt was able to perform weight shifting activities while in standing with PTA noting activation of LLE. Pt then expressed that she would see "5 or 6" of the therapist. When L eye occluded pt states able to see clearer, discussed with OT/COTA who will continue further assessments. Pt completed squat pivot transfer to R with minA. Pt transported to [employer] with pt then indicating she was "seeing bubbles" on ceiling. PTA went to obtain cuff to check BP, upon return pt labile with pt being comforted by another PT. Pt expressed did not know where she was when looking out window. PTA  spent time reorienting pt and took pt to window with pt standing with minA while looking out window. Pt much calmer after this activity as was able to recall geographic locations/directions. When pt returned to w/c initiated instruction in w/c mobility via hemi technique. Pt initially requiring HOH assist for sequencing however was able to progress to hemi propulsion ~170ft with supervision before fatigue limiting additional propulsion (pt was just outside room). Pt transported remaining distance into room and set up to complete squat pivot transfer to recliner. Pt comeplted transfer with minA and left repositioned to comfort. Pt left in recliner at end of session with belt alarm on, call bell within reach and needs met.      Therapy Documentation Precautions:  Precautions Precautions: Fall Precaution/Restrictions Comments: L hemi Restrictions Weight Bearing Restrictions Per Provider Order: No General:   Vital Signs: Therapy Vitals Temp: 97.9 F (36.6 C) Pulse Rate: 62 Resp: 17 BP: 111/60 Patient Position (if appropriate): Lying Oxygen Therapy SpO2: 99 % O2 Device: Room Air Pain:   Mobility:   Locomotion :    Trunk/Postural Assessment :    Balance:   Exercises:   Other Treatments:      Therapy/Group: Individual Therapy  Theresa Watkins 02/11/2024, 3:42 PM

## 2024-02-11 NOTE — Progress Notes (Signed)
 PROGRESS NOTE   Subjective/Complaints:  Pt upset about "cold meals"- and also upset cannot have food now, but because of choking incident yesterday, SLP is going to eval her swallowing clinically this AM.   LBM 2 days ago- denies constipation- said hasn't eaten much.  ROS:  Limited due to severe dysarthria  Objective:   No results found. Recent Labs    02/10/24 0513  WBC 5.7  HGB 16.1*  HCT 49.1*  PLT 197   Recent Labs    02/10/24 0513  NA 143  K 4.1  CL 108  CO2 26  GLUCOSE 100*  BUN 18  CREATININE 0.82  CALCIUM 9.3    Intake/Output Summary (Last 24 hours) at 02/11/2024 1308 Last data filed at 02/10/2024 1200 Gross per 24 hour  Intake 118 ml  Output --  Net 118 ml        Physical Exam: Vital Signs Blood pressure 130/89, pulse 72, temperature 98.2 F (36.8 C), resp. rate 17, height 5\' 6"  (1.676 m), weight 64.4 kg, SpO2 98%.    General: awake, alert, appropriate, sitting up in bed- light in face- increased her sitting position to get light out of eyes; NAD HENT: conjugate gaze; oropharynx moist CV: regular rate and rhythm; no JVD Pulmonary: CTA B/L; no W/R/R- good air movement GI: soft, NT, ND, (+)BS Psychiatric: appropriate- less flat today- mor einteractive Neurological: alert- signing to me when having difficulty speaking due to dysarthria- severe Neurologic: Cranial nerves II through XII intact, motor strength is 5/5 in right , 2-/5 left  deltoid, bicep, tricep, grip, 5/5 Right, 3/5 left hip flexor, knee extensors, 5/5 RIght , 2- left ankle dorsiflexor and plantar flexor Sensory exam normal sensation to light touch in right upper and lower extremities, feels pinch LLE , no sensation to pinch in left fingers Cerebellar exam normal finger to nose to finger RIght , unable to perform on left due to weakness Musculoskeletal: Full range of motion in all 4 extremities. No joint swelling     Assessment/Plan: 1. Functional deficits which require 3+ hours per day of interdisciplinary therapy in a comprehensive inpatient rehab setting. Physiatrist is providing close team supervision and 24 hour management of active medical problems listed below. Physiatrist and rehab team continue to assess barriers to discharge/monitor patient progress toward functional and medical goals  Care Tool:  Bathing              Bathing assist Assist Level: Maximal Assistance - Patient 24 - 49%     Upper Body Dressing/Undressing Upper body dressing   What is the patient wearing?: Pull over shirt    Upper body assist Assist Level: Maximal Assistance - Patient 25 - 49%    Lower Body Dressing/Undressing Lower body dressing            Lower body assist Assist for lower body dressing: Maximal Assistance - Patient 25 - 49%     Toileting Toileting    Toileting assist Assist for toileting: Total Assistance - Patient < 25%     Transfers Chair/bed transfer  Transfers assist     Chair/bed transfer assist level: Minimal Assistance - Patient > 75% (squat pivot)  Locomotion Ambulation   Ambulation assist      Assist level: 2 helpers (max a +w/c follow) Assistive device: Other (comment) (R hand rail) Max distance: 10   Walk 10 feet activity   Assist     Assist level: 2 helpers (max a +w/c follow) Assistive device: Other (comment) (R hand rail)   Walk 50 feet activity   Assist Walk 50 feet with 2 turns activity did not occur: Safety/medical concerns (L hemiplegia and pt fatigue)         Walk 150 feet activity   Assist Walk 150 feet activity did not occur: Safety/medical concerns (L hemiplegia and pt fatigue)         Walk 10 feet on uneven surface  activity   Assist Walk 10 feet on uneven surfaces activity did not occur: Safety/medical concerns (L hemiplegia and pt fatigue)         Wheelchair     Assist Is the patient using a wheelchair?:  Yes Type of Wheelchair: Manual    Wheelchair assist level: Dependent - Patient 0%      Wheelchair 50 feet with 2 turns activity    Assist        Assist Level: Dependent - Patient 0%   Wheelchair 150 feet activity     Assist      Assist Level: Dependent - Patient 0%   Blood pressure 130/89, pulse 72, temperature 98.2 F (36.8 C), resp. rate 17, height 5\' 6"  (1.676 m), weight 64.4 kg, SpO2 98%.   Medical Problem List and Plan: 1. 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  Antithrombotics: -DVT/anticoagulation:  Pharmaceutical: Lovenox             -antiplatelet therapy: Aspirin 81 mg daily and Plavix 75 mg daily x 3 weeks then Plavix alone 3. Pain Management: Tylenol as needed  3/25- denies pain- con't regimen rpn  4. Mood/Behavior/Sleep: Provide emotional support             -antipsychotic agents: N/A 5. Neuropsych/cognition: This patient is capable of making decisions on her own behalf. 6. Skin/Wound Care: Routine skin checks 7. Fluids/Electrolytes/Nutrition: Routine and outs with follow-up chemistries 8.  Dysphagia.  Dysphagia #2 thin liquids.  Follow-up speech therapy 9.  Hyperlipidemia.  Crestor 10.  History of cocaine/tobacco use.  UDS positive for cocaine.  Provide counseling, this has been a chronic issue , seen in PCP notes 11.  Hypothyroidism.  Synthroid 12.  Constipation.  MiraLAX daily, Senokot-S 1 tab twice daily   3/25- LBM last night after 10 days with no BM  3/26- LBM 2 nights ago- wll monitor- is on Senna 1 tab BID and Miralax- not receiving pain meds, or Baclofen, so less chance of  constipation from meds.  13. Dysarthria- hard to understand- SLP ordered 14. Polycythemia  3/25- Hb 16.1- and BUN up to 18, so is a little dry- BUN was 9 prior.   3/26- will recheck labs Thursday  I spent a total of  36  minutes on  total care today- >50% coordination of care- due to  Reviewed chart including choking episode - to have SLP observe her eat this AM for breakfast. Also frustrated about her cold food-     LOS: 2 days A FACE TO FACE EVALUATION WAS PERFORMED  Montarius Kitagawa 02/11/2024, 8:23 AM

## 2024-02-11 NOTE — Progress Notes (Signed)
 Speech Language Pathology Daily Session Note  Patient Details  Name: Theresa Watkins MRN: [id number] Date of Birth: Aug 15, 1966  Today's Date: 02/11/2024 SLP Individual Time: 0802-0903 SLP Individual Time Calculation (min): 61 min  Short Term Goals: Week 1: SLP Short Term Goal 1 (Week 1): Pt will trial Dys 3 textures with SLP only demonstrating timely and functional oral phase of swallow provided min-mod A cues SLP Short Term Goal 2 (Week 1): Pt will utilize multimodal communication to express basic wants and needs with sup A SLP Short Term Goal 3 (Week 1): Patient will utilize speech intelligibility strategies at the phrase level to achieve 75% intelligibility with mod A SLP Short Term Goal 4 (Week 1): Patient will utilize swallowing compensatory strategies to reduce s/sx of aspiration during consumption of thin liquids given min multimodal A  Skilled Therapeutic Interventions:  Patient was seen in am to address dysphagia management and speech intelligibility. Pt was alert and seated in bed upon SLP arrival. NT present and supervising meal. Pt was consuming Dys 2 diet and thin liquids with noted with anterior spillage and pocketing in sulcus. SLP introduced visual feedback to faciliatate oral clearance. Pt warranting mod cues for additional swallow and finger/ lingual sweeps. Delayed strong cough noted x2 as pt observed to reposition her self during meal. Suspect reduced lingual coordination and premature spillage as cause. SLP recommending continuation of Dys 2 solids and thin liquids with full supervision. In other minutes of session, SLP addressed speech intelligibility. SLP introduced strategies of breath support, short simple phrases, and separating words. Pt guided in production of single and multiple syllable words. She produced single syllable words with ~80% intelligibility given min A, 2 syllable words with 75% acc and min A. Intelligibility noted to significantly drop with 3 syllable  words as vocal arrest observed by last syllable. Speech intelligibility varied between 60- 70%. In other minutes of session, SLP challenged pt in production of biographical information. Pt initially warranting mod cues which were able to be faded to min A for short simple phrases and breath support. SLP initiated functional phrases (I need to go to the bathroom, I'm in pain, I'm hungry). Pt produced phrases with 80% intelligibility with min A. At conclusion of session pt was left upright in bed with call button within reach and bed alarm active. SLP to continue POC.  Pain Pain Assessment Pain Scale: 0-10 Pain Score: 8  Pain Type: Acute pain Pain Location: Head Pain Orientation: Right Pain Intervention(s): RN made aware  Therapy/Group: Individual Therapy  [name] 02/11/2024, 10:00 AM

## 2024-02-11 NOTE — Progress Notes (Signed)
 Occupational Therapy Session Note  Patient Details  Name: Theresa Watkins MRN: [id number] Date of Birth: August 16, 1966  Today's Date: 02/11/2024 OT Individual Time: 1300-1340 OT Individual Time Calculation (min): 40 min    Short Term Goals: Week 1:  OT Short Term Goal 1 (Week 1): Pt will complete PROM with LUE without VC OT Short Term Goal 2 (Week 1): Pt will use LUE as stabilizer during ADL tasks with Mod A OT Short Term Goal 3 (Week 1): Pt will complete shower transfer at Mod A with LRAD OT Short Term Goal 4 (Week 1): Pt will compplete UB dressing with himi technique at Mod A  Skilled Therapeutic Interventions/Progress Updates:    OT intervention with focus on sit<>stand, standing balance, BSC transfers, toileting, and safety awareness to increase independence with bADLs. Sit<>stand with min A. Standing balance and stand pivot transfer with max A and assist to advance LLE. Max A for [facility] transfer. Max A for toileting. Max A for sit>supine after sitting EOB. Max verbal cues for sequencing and technique.Pt remained in bed with all needs within reach.   Therapy Documentation Precautions:  Precautions Precautions: Fall Precaution/Restrictions Comments: L hemi Restrictions Weight Bearing Restrictions Per Provider Order: No Pain:  Pt denies pain this afternoon  Therapy/Group: Individual Therapy  Rich Brave 02/11/2024, 3:16 PM

## 2024-02-11 NOTE — Telephone Encounter (Addendum)
 FYI

## 2024-02-11 NOTE — Telephone Encounter (Signed)
 Copied from CRM [id number]. Topic: Appointments - Scheduling Inquiry for Clinic >> Feb 11, 2024  1:44 PM Theresa Watkins wrote: Reason for CRM: Theresa Watkins, mother, said patient has been in [place] for the last week. Patient has a stroke last Monday. Currently in [facility]hab [place]. Callback number is [phone]

## 2024-02-12 DIAGNOSIS — I63511 Cerebral infarction due to unspecified occlusion or stenosis of right middle cerebral artery: Secondary | ICD-10-CM | POA: Diagnosis not present

## 2024-02-12 LAB — BASIC METABOLIC PANEL WITH GFR
Anion gap: 11 (ref 5–15)
BUN: 18 mg/dL (ref 6–20)
CO2: 23 mmol/L (ref 22–32)
Calcium: 8.8 mg/dL — ABNORMAL LOW (ref 8.9–10.3)
Chloride: 107 mmol/L (ref 98–111)
Creatinine, Ser: 0.78 mg/dL (ref 0.44–1.00)
GFR, Estimated: >60 mL/min (ref >60)
Glucose, Bld: 97 mg/dL (ref 70–99)
Potassium: 3.8 mmol/L (ref 3.5–5.1)
Sodium: 141 mmol/L (ref 135–145)

## 2024-02-12 LAB — CBC WITH DIFFERENTIAL/PLATELET
Abs Immature Granulocytes: 0.02 10*3/uL (ref 0.00–0.07)
Basophils Absolute: 0 10*3/uL (ref 0.0–0.1)
Basophils Relative: 0 %
Eosinophils Absolute: 0.1 10*3/uL (ref 0.0–0.5)
Eosinophils Relative: 2 %
HCT: 46 % (ref 36.0–46.0)
Hemoglobin: 14.9 g/dL (ref 12.0–15.0)
Immature Granulocytes: 0 %
Lymphocytes Relative: 38 %
Lymphs Abs: 2 10*3/uL (ref 0.7–4.0)
MCH: 28.2 pg (ref 26.0–34.0)
MCHC: 32.4 g/dL (ref 30.0–36.0)
MCV: 87.1 fL (ref 80.0–100.0)
Monocytes Absolute: 0.5 10*3/uL (ref 0.1–1.0)
Monocytes Relative: 9 %
Neutro Abs: 2.6 10*3/uL (ref 1.7–7.7)
Neutrophils Relative %: 51 %
Platelets: 183 10*3/uL (ref 150–400)
RBC: 5.28 MIL/uL — ABNORMAL HIGH (ref 3.87–5.11)
RDW: 13.7 % (ref 11.5–15.5)
WBC: 5.2 10*3/uL (ref 4.0–10.5)
nRBC: 0 % (ref 0.0–0.2)

## 2024-02-12 MED ORDER — SENNOSIDES-DOCUSATE SODIUM 8.6-50 MG PO TABS
2.0000 | ORAL_TABLET | Freq: Two times a day (BID) | ORAL | Status: DC
  Administered 2024-02-12 – 2024-04-28 (×139): 2 via ORAL
  Filled 2024-02-12 (×156): qty 2

## 2024-02-12 MED ORDER — SORBITOL 70 % SOLN
30.0000 mL | Freq: Once | Status: DC
  Filled 2024-02-12: qty 30

## 2024-02-12 NOTE — Group Note (Signed)
 Patient Details Name: Theresa Watkins MRN: [id number] DOB: 1966/01/23 Today's Date: 02/12/2024  Time Calculation: OT Group Time Calculation OT Group Start Time: 1430 OT Group Stop Time: 1530 OT Group Time Calculation (min): 60 min      Group Description: Dance Group: Pt participated in dance group with an emphasis on social interaction, motor planning, increasing overall activity tolerance and bimanual tasks. All songs were selected by group members. Dance moves included AROM of BUE/BLE gross motor movements with an emphasis on building functional endurance.    Individual level documentation: Patient completed group from sitting level. Patientt needed supervision to complete various dance moves with cues for L UE positioning.  Patient needed min modifications during group.  Pain: Pain Assessment Pain Scale: 0-10 Pain Score: 8  Pain Location: Head Pain Intervention(s): Medication (See eMAR)  Precautions:    Clide Deutscher 02/12/2024, 4:00 PM

## 2024-02-12 NOTE — Progress Notes (Signed)
 PROGRESS NOTE   Subjective/Complaints:  Pt reports doing OK Having blurry vision ever since stroke- wears readers but no other glasses-    LBM 3 days ago- feels very constipated.   Feels like new movement in LUE is "due to brace".    ROS:  Limited due to dysarthria Objective:   No results found. Recent Labs    02/10/24 0513 02/12/24 0550  WBC 5.7 5.2  HGB 16.1* 14.9  HCT 49.1* 46.0  PLT 197 183   Recent Labs    02/10/24 0513 02/12/24 0550  NA 143 141  K 4.1 3.8  CL 108 107  CO2 26 23  GLUCOSE 100* 97  BUN 18 18  CREATININE 0.82 0.78  CALCIUM 9.3 8.8*    Intake/Output Summary (Last 24 hours) at 02/12/2024 0955 Last data filed at 02/12/2024 0800 Gross per 24 hour  Intake 960 ml  Output --  Net 960 ml        Physical Exam: Vital Signs Blood pressure 110/78, pulse 65, temperature 98.9 F (37.2 C), temperature source Oral, resp. rate 16, height 5\' 6"  (1.676 m), weight 64.4 kg, SpO2 97%.     General: awake, alert, appropriate, supine in bed; just woke her up; NAD HENT: conjugate gaze; oropharynx moist- blurry vision- hard to test- but can see when I sign CV: regular rate and rhythm; no JVD Pulmonary: CTA B/L; no W/R/R- good air movement GI: soft, NT, ND, (+)BS Psychiatric: appropriate- interactive, pleasant Neurological: severe dysarthria- takes multiple repetitions to understand her.  L biceps 2/5- slightly better this AM  Neurologic: Cranial nerves II through XII intact, motor strength is 5/5 in right , 2-/5 left  deltoid, bicep, tricep, grip, 5/5 Right, 3/5 left hip flexor, knee extensors, 5/5 RIght , 2- left ankle dorsiflexor and plantar flexor Sensory exam normal sensation to light touch in right upper and lower extremities, feels pinch LLE , no sensation to pinch in left fingers Cerebellar exam normal finger to nose to finger RIght , unable to perform on left due to weakness Musculoskeletal:  Full range of motion in all 4 extremities. No joint swelling    Assessment/Plan: 1. Functional deficits which require 3+ hours per day of interdisciplinary therapy in a comprehensive inpatient rehab setting. Physiatrist is providing close team supervision and 24 hour management of active medical problems listed below. Physiatrist and rehab team continue to assess barriers to discharge/monitor patient progress toward functional and medical goals  Care Tool:  Bathing    Body parts bathed by patient: Face, Front perineal area, Right upper leg, Left upper leg, Abdomen, Chest   Body parts bathed by helper: Right arm, Left arm, Buttocks, Right lower leg, Left lower leg     Bathing assist Assist Level: Moderate Assistance - Patient 50 - 74%     Upper Body Dressing/Undressing Upper body dressing   What is the patient wearing?: Pull over shirt    Upper body assist Assist Level: Maximal Assistance - Patient 25 - 49%    Lower Body Dressing/Undressing Lower body dressing            Lower body assist Assist for lower body dressing: Maximal Assistance - Patient 25 -  49%     Toileting Toileting    Toileting assist Assist for toileting: Total Assistance - Patient < 25%     Transfers Chair/bed transfer  Transfers assist     Chair/bed transfer assist level: Minimal Assistance - Patient > 75% (squat pivot)     Locomotion Ambulation   Ambulation assist      Assist level: 2 helpers (max a +w/c follow) Assistive device: Other (comment) (R hand rail) Max distance: 10   Walk 10 feet activity   Assist     Assist level: 2 helpers (max a +w/c follow) Assistive device: Other (comment) (R hand rail)   Walk 50 feet activity   Assist Walk 50 feet with 2 turns activity did not occur: Safety/medical concerns (L hemiplegia and pt fatigue)         Walk 150 feet activity   Assist Walk 150 feet activity did not occur: Safety/medical concerns (L hemiplegia and pt  fatigue)         Walk 10 feet on uneven surface  activity   Assist Walk 10 feet on uneven surfaces activity did not occur: Safety/medical concerns (L hemiplegia and pt fatigue)         Wheelchair     Assist Is the patient using a wheelchair?: Yes Type of Wheelchair: Manual    Wheelchair assist level: Dependent - Patient 0%      Wheelchair 50 feet with 2 turns activity    Assist        Assist Level: Dependent - Patient 0%   Wheelchair 150 feet activity     Assist      Assist Level: Dependent - Patient 0%   Blood pressure 110/78, pulse 65, temperature 98.9 F (37.2 C), temperature source Oral, resp. rate 16, height 5\' 6"  (1.676 m), weight 64.4 kg, SpO2 97%.   Medical Problem List and Plan: 1. 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  Antithrombotics: -DVT/anticoagulation:  Pharmaceutical: Lovenox             -antiplatelet therapy: Aspirin 81 mg daily and Plavix 75 mg daily x 3 weeks then Plavix alone 3. Pain Management: Tylenol as needed  3/25- denies pain- con't regimen rpn  4. Mood/Behavior/Sleep: Provide emotional support             -antipsychotic agents: N/A 5. Neuropsych/cognition: This patient is capable of making decisions on her own behalf. 6. Skin/Wound Care: Routine skin checks 7. Fluids/Electrolytes/Nutrition: Routine and outs with follow-up chemistries 8.  Dysphagia.  Dysphagia #2 thin liquids.  Follow-up speech therapy  3/27- no change in diet so far 9.  Hyperlipidemia.  Crestor 10.  History of cocaine/tobacco use.  UDS positive for cocaine.  Provide counseling,  this has been a chronic issue , seen in PCP notes 11.  Hypothyroidism.  Synthroid 12.  Constipation.  MiraLAX daily, Senokot-S 1 tab twice daily   3/25- LBM last night after 10 days with no BM  3/26- LBM 2 nights ago- wll monitor- is on Senna 1 tab  BID and Miralax- not receiving pain meds, or Baclofen, so less chance of constipation from meds. 3/27- LBM 3 days ago- will give Sorbitol, and soap suds enema- and increase Senna to 2 tabs BID  13. Dysarthria- hard to understand- SLP ordered 14. Polycythemia  3/25- Hb 16.1- and BUN up to 18, so is a little dry- BUN was 9 prior.   3/26- will recheck labs Thursday  3/27- Back to 14.9 after drinking more 15. Blurry vision  3/27- will need Ophtho after d/c.    I spent a total of  36  minutes on total care today- >50% coordination of care- due to  D/w pt about her blurry vision; which is likely due to stroke, also did IPOC, made meds changes for bowels and education of pt about her Vision.   LOS: 3 days A FACE TO FACE EVALUATION WAS PERFORMED  Theresa Watkins 02/12/2024, 9:55 AM

## 2024-02-12 NOTE — IPOC Note (Signed)
 Overall Plan of Care [facility]) Patient Details Name: Theresa Watkins MRN: [id number] DOB: [birth year]  Admitting Diagnosis: Right middle cerebral artery stroke [facility])  Hospital Problems: Principal Problem:   Right middle cerebral artery stroke [facility])     Functional Problem List: Nursing Bowel, Pain, Safety, Endurance, Medication Management  PT Balance, Perception, Sensory, Endurance, Motor  OT Balance, Safety, Sensory, Endurance, Motor, Pain  SLP Motor, Nutrition  TR         Basic ADL's: OT Eating, Grooming, Bathing, Dressing, Toileting     Advanced  ADL's: OT       Transfers: PT Bed Mobility, Bed to Chair, Car  OT Tub/Shower, Toilet     Locomotion: PT Ambulation, Psychologist, prison and probation services, Stairs     Additional Impairments: OT Fuctional Use of Upper Extremity  SLP Swallowing, Communication expression    TR      Anticipated Outcomes Item Anticipated Outcome  Self Feeding set-up  Swallowing  min A   Basic self-care  Min A  Toileting  Min A   Bathroom Transfers Min A  Bowel/Bladder  manage bowel w mod I assist  Transfers  supervision  Locomotion  minA ambulation, mod(I) w/c  Communication  min A  Cognition     Pain  Pain < 4 with prns  Safety/Judgment  Manage safety w cues   Therapy Plan: PT Intensity: Minimum of 1-2 x/day ,45 to 90 minutes PT Frequency: 5 out of 7 days PT Duration Estimated Length of Stay: 3-4 weeks OT Intensity: Minimum of 1-2 x/day, 45 to 90 minutes OT Frequency: 5 out of 7 days OT Duration/Estimated Length of Stay: 3-4 weeks SLP Intensity: Minumum of 1-2 x/day, 30 to 90 minutes SLP Frequency: 3 to 5 out of 7 days SLP Duration/Estimated Length of Stay: 3-4 weeks   Team Interventions: Nursing Interventions Medication Management, Discharge Planning, Bowel Management, Disease Management/Prevention, Pain Management, Patient/Family Education, Dysphagia/Aspiration Precaution Training  PT interventions Ambulation/gait training, Community  reintegration, Fish farm manager, Neuromuscular re-education, Museum/gallery curator, UE/LE Strength taining/ROM, Wheelchair propulsion/positioning, Warden/ranger, Discharge planning, Functional electrical stimulation, Pain management, Therapeutic Activities, UE/LE Coordination activities, Cognitive remediation/compensation, Disease management/prevention, Functional mobility training, Patient/family education, Splinting/orthotics, Therapeutic Exercise, Visual/perceptual remediation/compensation  OT Interventions Balance/vestibular training, Discharge planning, Functional electrical stimulation, Pain management, Self Care/advanced ADL retraining, Therapeutic Activities, UE/LE Coordination activities, Cognitive remediation/compensation, Disease mangement/prevention, Functional mobility training, Patient/family education, Skin care/wound managment, Therapeutic Exercise, Visual/perceptual remediation/compensation, Firefighter, Fish farm manager, Neuromuscular re-education, Psychosocial support, Splinting/orthotics, UE/LE Strength taining/ROM, Wheelchair propulsion/positioning  SLP Interventions Multimodal communication approach, Speech/Language facilitation, Functional tasks, Therapeutic Activities, Therapeutic Exercise, Internal/external aids, Dysphagia/aspiration precaution training, Patient/family education  TR Interventions    SW/CM Interventions Patient/Family Education, Psychosocial Support, Discharge Planning   Barriers to Discharge MD  Medical stability, Home enviroment access/loayout, Neurogenic bowel and bladder, Lack of/limited family support, and Weight bearing restrictions  Nursing Decreased caregiver support, Home environment access/layout 2 level townhome, 2ste 1/2 ba on main, w mom, used straight cane PTA  PT Decreased caregiver support, Home environment access/layout narrow doorways, 2 STE w/o HR  OT Decreased caregiver support, Home  environment access/layout, Incontinence    SLP      SW Insurance for SNF coverage, Decreased caregiver support, Lack of/limited family support     Team Discharge Planning: Destination: PT-Home ,OT- Home , SLP-Home Projected Follow-up: PT-Outpatient PT, OT-  24 hour supervision/assistance, SLP-Home Health SLP, Outpatient SLP Projected Equipment Needs: PT-To be determined, OT- To be determined, SLP-To be determined Equipment Details: PT-TBD, OT-does not own any  DME Patient/family involved in discharge planning: PT- Patient, Family member/caregiver,  OT-Patient, SLP-Patient  MD ELOS: 3-4 weeks Medical Rehab Prognosis:  Good Assessment: The patient has been admitted for CIR therapies with the diagnosis of R MCA stroke. The team will be addressing functional mobility, strength, stamina, balance, safety, adaptive techniques and equipment, self-care, bowel and bladder mgt, patient and caregiver education, treatment for dysarthria. Goals have been set at min A to supervision. Anticipated discharge destination is home.        See Team Conference Notes for weekly updates to the plan of care

## 2024-02-12 NOTE — Progress Notes (Signed)
 Occupational Therapy Session Note  Patient Details  Name: Theresa Watkins MRN: [id number] Date of Birth: 14-May-1966  Today's Date: 02/12/2024 OT Individual Time: 0347-4259 OT Individual Time Calculation (min): 75 min    Short Term Goals: Week 1:  OT Short Term Goal 1 (Week 1): Pt will complete PROM with LUE without VC OT Short Term Goal 2 (Week 1): Pt will use LUE as stabilizer during ADL tasks with Mod A OT Short Term Goal 3 (Week 1): Pt will complete shower transfer at Mod A with LRAD OT Short Term Goal 4 (Week 1): Pt will compplete UB dressing with himi technique at Mod A  Skilled Therapeutic Interventions/Progress Updates:    OT intervention with focus on bed mobility, sitting balance, standing balance in [place], bathing at shower level, dressing with sit<>stand in [place], LUE functional use, and safety awareness to increase independence with BADLs. All transfers with [facility] for time mgmt this morning. Bathing at shower level with focus on sitting balance and LUE use to assist with tasks. Min A for sitting balance. Pt inisitates use of LUE to bathe but requires max A to complete tasks with LUE. Sit<>stand in [place] with min A/CGA. Max A for UB dressing with max verbal cues for hemi dressing techniques. Max A for LB dressing with sit<>stand in [place]. Pt transferred to recliner. All needs within reach and belt alarm activated.   Therapy Documentation Precautions:  Precautions Precautions: Fall Precaution/Restrictions Comments: L hemi Restrictions Weight Bearing Restrictions Per Provider Order: No   Pain: Pain Assessment Pain Scale: 0-10 Pain Score: 0-No pain   Therapy/Group: Individual Therapy  Rich Brave 02/12/2024, 11:49 AM

## 2024-02-12 NOTE — Progress Notes (Signed)
 Physical Therapy Session Note  Patient Details  Name: Theresa Watkins MRN: [id number] Date of Birth: [birth year]  Today's Date: 02/12/2024 PT Individual Time: 1305-1400 PT Individual Time Calculation (min): 55 min   Short Term Goals: Week 1:  PT Short Term Goal 1 (Week 1): Pt will perform squat pivot tx with CGA bed<>chair PT Short Term Goal 2 (Week 1): Pt will ambulate 20' with maxA and LRAD PT Short Term Goal 3 (Week 1): Pt will propel w/c 150' with SBA  Skilled Therapeutic Interventions/Progress Updates: Pt presented in recliner agreeable to therapy. Pt denies pain at start of session c/o HA by end of session with nsg notified and pain meds received at end of session. Session focused on standing balance and initiation of gait. Pt completed stand pivot transfer to w/c with modA and max multimodal cues. Pt transported to main gym hallway and performed Sit to stand with wall rail with weight shifting activities for pre gait activity. Pt was also able to perform standing TKE on LLE x 10 with PTA providing tactile feedback to less hyperextension. Pt also worked on toe taps to target while holding rail, pt noted to have improved hip flexion with pt able to clear foot from ground and with increased time was able to adequately hit target. Pt was able to then to participate in gait training at wall with modA ~33ft. With verbal cues pt was able to place LLE adequately to avoid scissoring but required max multimodal cues to engage quad before attempting to step with RLE. Pt with noted fatigue after activity. Pt then transported to mat and completed stand step transfer to mat with maxA and max multimodal cues for sequencing. At [facility] pt participated in seated reaching task picking up cones on tray and placing. During rest break pt performed AA task of trying to reach for cone with LUE. Pt also completed stacking task in standing requiring minA to stand from mat and with PTA blocking mat pt stacked cones with RUE.  At this time pt began to c/o increased HA therefore pt completed squat pivot transfer to R back to w/c with minA and transported back to room. Pt completed same transfer back to recliner at end of session. Pt left in recliner at end of session with belt alarm on, call bell within reach and nsg present.      Therapy Documentation Precautions:  Precautions Precautions: Fall Precaution/Restrictions Comments: L hemi Restrictions Weight Bearing Restrictions Per Provider Order: No General:   Vital Signs: Therapy Vitals Temp: (!) 97.3 F (36.3 C) Pulse Rate: 79 Resp: 17 BP: 124/80 Patient Position (if appropriate): Sitting Oxygen Therapy SpO2: 96 % O2 Device: Room Air Pain: Pain Assessment Pain Scale: 0-10 Pain Score: 8  Pain Location: Head Pain Intervention(s): Medication (See eMAR)  Therapy/Group: Individual Therapy  Callen Vancuren 02/12/2024, 2:38 PM

## 2024-02-12 NOTE — Progress Notes (Signed)
 Speech Language Pathology Daily Session Note  Patient Details  Name: Theresa Watkins MRN: [id number] Date of Birth: 11/04/1966  Today's Date: 02/12/2024 SLP Individual Time: 1100-1158 SLP Individual Time Calculation (min): 58 min  Short Term Goals: Week 1: SLP Short Term Goal 1 (Week 1): Pt will trial Dys 3 textures with SLP only demonstrating timely and functional oral phase of swallow provided min-mod A cues SLP Short Term Goal 2 (Week 1): Pt will utilize multimodal communication to express basic wants and needs with sup A SLP Short Term Goal 3 (Week 1): Patient will utilize speech intelligibility strategies at the phrase level to achieve 75% intelligibility with mod A SLP Short Term Goal 4 (Week 1): Patient will utilize swallowing compensatory strategies to reduce s/sx of aspiration during consumption of thin liquids given min multimodal A  Skilled Therapeutic Interventions: Skilled therapy session focused on communication and dysphagia goals. Due to patients decreased respiratory support and imprecise articulation, SLP tested maximum expiratory pressure and anterior/posterior lingual strength. Patient with MEP of 49cm H2O (average for her age is 81cm H2O) therefore EMST (expiratory muscle strength training) was initiated at around 30cm H2O. Patient completed 25 repetitions given supervisionA. SLP then utilized IOPI to evaluate anterior and posterior lingual strength. Patient with an average anterior kpa of 24 and posterior kpa of 10 indicative of significantly reduced lingual strength. SLP targeted dysphagia through offering D3/thin liquid textures. Patient with prolonged mastication and "sucking" on D3 textures due to lack of bottom dentition. X1 instance of immediate coughing after thin liquids, which may be indicative of bolus misdirection. Recommend continuation of current diet D2/thin with strict aspiration and standardized swallowing precautions. Patient left in chair with alarm set and  call bell in reach. Continue POC.    Pain None reported   Therapy/Group: Individual Therapy  Shivansh Hardaway M.A., CF-SLP 02/12/2024, 7:39 AM

## 2024-02-13 ENCOUNTER — Inpatient Hospital Stay (HOSPITAL_COMMUNITY)

## 2024-02-13 DIAGNOSIS — I63511 Cerebral infarction due to unspecified occlusion or stenosis of right middle cerebral artery: Secondary | ICD-10-CM | POA: Diagnosis not present

## 2024-02-13 NOTE — Progress Notes (Signed)
 Speech Language Pathology Daily Session Note  Patient Details  Name: Theresa Watkins MRN: [id number] Date of Birth: [birth year]  Today's Date: 02/13/2024 SLP Individual Time: 0900-1003 SLP Individual Time Calculation (min): 63 min  Short Term Goals: Week 1: SLP Short Term Goal 1 (Week 1): Pt will trial Dys 3 textures with SLP only demonstrating timely and functional oral phase of swallow provided min-mod A cues SLP Short Term Goal 2 (Week 1): Pt will utilize multimodal communication to express basic wants and needs with sup A SLP Short Term Goal 3 (Week 1): Patient will utilize speech intelligibility strategies at the phrase level to achieve 75% intelligibility with mod A SLP Short Term Goal 4 (Week 1): Patient will utilize swallowing compensatory strategies to reduce s/sx of aspiration during consumption of thin liquids given min multimodal A  Skilled Therapeutic Interventions: Skilled ST session focused on dysphagia and communication goals. SLP facilitated session by providing supervision A during completion of EMST (expiratory muscle strength training) set at 30cm H2O. Patient completed 20 repetitions this date. Nursing approached SLP regarding concerns of aspiration. Patient with choking event on solids during breakfast and occasional coughing after thin liquids. SLP observed PO trials of thin liquids via cup and provale (5cc). Patient with x1 immediate cough, though no other s/sx of aspiration. Patient reports feeling as if she is "swallowing too much air" with provale, though SLP encouraged small, single sips to reduce feeling of GERD. During D2 trials, patient with mildly prolonged mastication and mild-moderate oral residuals, cleared through additional swallows and liquid wash. Recommend continuation of curent diet (D2/thin) with provale for thin liquids. Patient should sit completely upright, with small bites/sips, slow rate, and elimination of distractions. Medication crushed in puree. Plan  for MBS next avaliable date. Patient left in chair with alarm set and nursing present.   Pain None reported to SLP  Therapy/Group: Individual Therapy  Rainn Zupko M.A., CCC-SLP 02/13/2024, 7:38 AM

## 2024-02-13 NOTE — Progress Notes (Signed)
 Inpatient Rehabilitation Care Coordinator Assessment and Plan Patient Details  Name: Theresa Watkins MRN: [id number] Date of Birth: 12/24/1965  Today's Date: 02/13/2024  Hospital Problems: Principal Problem:   Right middle cerebral artery stroke [facility])  Past Medical History:  Past Medical History:  Diagnosis Date   Abnormal Pap smear    Bronchitis    Fibroid    Headache(784.0)    Ovarian cyst    Plantar fasciitis    Seizures (HCC)    Stroke [facility])    Thyroid cancer (HCC) 20011   Trichomonas    Urinary tract infection    Past Surgical History:  Past Surgical History:  Procedure Laterality Date   ABDOMINAL HYSTERECTOMY     CESAREAN SECTION     goiter     removed   laparoscopic  1988   removal  of ectopic preg, ruptured tube   THERAPEUTIC ABORTION     THYROIDECTOMY  march 2011   cancer   Social History:  reports that she has been smoking cigarettes. She has a 2.5 pack-year smoking history. She has never used smokeless tobacco. She reports current alcohol use. She reports current drug use. Drug: Cocaine.  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 pt will d/c tohome with her mother. Caregiver Availability: 24/7 Family Dynamics: Pt was living alone prior to admission.  Social History Preferred language: English Religion: Non-Denominational Cultural Background: Pt has been disbaled for 5 years due to medical reasons Education: college Health Literacy - How often do you need to have someone help you when you read instructions, pamphlets, or other written material from your doctor or pharmacy?: Never Writes: Yes Employment Status: Disabled Date Retired/Disabled/Unemployed: 5 years Market researcher Issues: Denies Guardian/Conservator: N/A   Abuse/Neglect Abuse/Neglect Assessment Can Be Completed: Yes Physical Abuse: Denies Verbal Abuse: Denies Sexual Abuse: Denies Exploitation of patient/patient's resources: Denies Self-Neglect: Denies  Patient response to: Social Isolation - How often do you feel lonely or isolated from those around you?: Never  Emotional Status Pt's affect, behavior and adjustment status: Pt in good spirits at time of visit but unable to speak; pt mother completed assessment Recent Psychosocial Issues: Denies Psychiatric History: Denies Substance Abuse History: Pt was smoking cigarettes daily; occassional etoh use; pt mother unaware of rec drug use; per EM< hx of polysubstance abuse.  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 and mobility  [employer]: None Premorbid Home Care/DME Agencies: None Transportation available at discharge: TBD Is the patient able to respond to transportation needs?: Yes In the past 12 months, has lack of transportation kept you from medical appointments or from getting medications?: No In the past 12 months, has lack of transportation kept you from meetings, work, or from getting things needed for daily living?: No Resource referrals recommended: Neuropsychology  Discharge Planning Living Arrangements: Parent Support Systems: Children, Parent Type of Residence: Private residence Insurance Resources: Media planner (specify) ( Medicaid Healthy [employer]) Financial Resources: SSD Financial Screen Referred: No Living  Expenses: Lives with family Money Management: Family, Patient  Does the patient have any problems obtaining your medications?: No Home Management: Pt managed all homecare needs Patient/Family Preliminary Plans: Mother to assist with home care needs Care Coordinator Barriers to Discharge: Insurance for SNF coverage, Decreased caregiver support, Lack of/limited family support Care Coordinator Anticipated Follow Up Needs: HH/OP Expected length of stay: ELOS 3-4 weeks  Clinical Impression SW completed chart review and assessment with her mother. Pt is not a Cytogeneticist. No HCPOA. DME; cane, crutches, RW, and 3in1  BSC.   Maylene Crocker A Jennene Downie 02/13/2024, 2:15 PM

## 2024-02-13 NOTE — Progress Notes (Signed)
 Physical Therapy Session Note  Patient Details  Name: Theresa Watkins MRN: [id number] Date of Birth: 10-Dec-1965  Today's Date: 02/13/2024 PT Individual Time: 1303-1402 PT Individual Time Calculation (min): 59 min   Short Term Goals: Week 1:  PT Short Term Goal 1 (Week 1): Pt will perform squat pivot tx with CGA bed<>chair PT Short Term Goal 2 (Week 1): Pt will ambulate 44' with maxA and LRAD PT Short Term Goal 3 (Week 1): Pt will propel w/c 150' with SBA  Skilled Therapeutic Interventions/Progress Updates:     Direct handoff from nsg after toileting. Pt has no c/o pain at this time although notes fatigue d/t not being able to sleep well.   Pt washes hands with modA d/t decreased LUE muscle activation. Pt given modA to scoot forward in w/c to allow for self w/c propulsion with hemi-technique. Pt able to self propel ~100' with hemi technique and use of handrails in hallway, supervision and verbal cues for technique. Pt dependently transported in w/c the rest of the way to hallway d/t fatigue.   Gait training: three musketeers, verbal cues for step width and glute activation, maxAx2 with B knee block and postural support - 86' with intermittent R knee hyperextension, frequent L knee buckling - 23' with intermittent L knee hyperextension thrust, increased weight shift facilitation - 49' with intermittent B knee buckling improved with verbal cues for glute activation  Pt dependently transported back to room for energy conservation. Direct handoff to nsg for toileting.   Therapy Documentation Precautions:  Precautions Precautions: Fall Precaution/Restrictions Comments: L hemi Restrictions Weight Bearing Restrictions Per Provider Order: No General:      Therapy/Group: Individual Therapy  Collins Scotland 02/13/2024, 4:10 PM

## 2024-02-13 NOTE — Progress Notes (Signed)
 Met with patient to review current situation, team conferences, plan of care. We'll revisit when family is in room. Patient was sleepy at time of visit. Continue to follow along to provide educational needs to facilitate discharge.

## 2024-02-13 NOTE — Progress Notes (Signed)
 PROGRESS NOTE   Subjective/Complaints:  Pt reports slept OK.  LBM x2 yesterday.      ROS: Limited due to severe dysarthria   Objective:   No results found. Recent Labs    02/12/24 0550  WBC 5.2  HGB 14.9  HCT 46.0  PLT 183   Recent Labs    02/12/24 0550  NA 141  K 3.8  CL 107  CO2 23  GLUCOSE 97  BUN 18  CREATININE 0.78  CALCIUM 8.8*    Intake/Output Summary (Last 24 hours) at 02/13/2024 1318 Last data filed at 02/13/2024 0900 Gross per 24 hour  Intake 540 ml  Output --  Net 540 ml        Physical Exam: Vital Signs Blood pressure (!) 113/56, pulse 63, temperature 98.2 F (36.8 C), temperature source Oral, resp. rate 18, height 5\' 6"  (1.676 m), weight 64.4 kg, SpO2 97%.      General: awake, alert, appropriate,  supine in bed; NAD HENT: conjugate gaze; oropharynx moist CV: regular rate; no JVD Pulmonary: CTA B/L; no W/R/R- good air movement GI: soft, NT, ND, (+)BS Psychiatric: appropriate- frustrated due to dysarthria Neurological: severe dysarthria- takes multiple repetitions to understand her.  L biceps 2/5- slightly better this AM  Neurologic: Cranial nerves II through XII intact, motor strength is 5/5 in right , 2-/5 left  deltoid, bicep, tricep, grip, 5/5 Right, 3/5 left hip flexor, knee extensors, 5/5 RIght , 2- left ankle dorsiflexor and plantar flexor Sensory exam normal sensation to light touch in right upper and lower extremities, feels pinch LLE , no sensation to pinch in left fingers Cerebellar exam normal finger to nose to finger RIght , unable to perform on left due to weakness Musculoskeletal: Full range of motion in all 4 extremities. No joint swelling    Assessment/Plan: 1. Functional deficits which require 3+ hours per day of interdisciplinary therapy in a comprehensive inpatient rehab setting. Physiatrist is providing close team supervision and 24 hour management of active  medical problems listed below. Physiatrist and rehab team continue to assess barriers to discharge/monitor patient progress toward functional and medical goals  Care Tool:  Bathing    Body parts bathed by patient: Left arm, Chest, Abdomen, Front perineal area, Right upper leg, Left upper leg, Right lower leg, Face   Body parts bathed by helper: Right arm, Buttocks, Left lower leg     Bathing assist Assist Level: Moderate Assistance - Patient 50 - 74%     Upper Body Dressing/Undressing Upper body dressing   What is the patient wearing?: Pull over shirt    Upper body assist Assist Level: Maximal Assistance - Patient 25 - 49%    Lower Body Dressing/Undressing Lower body dressing            Lower body assist Assist for lower body dressing: Maximal Assistance - Patient 25 - 49%     Toileting Toileting    Toileting assist Assist for toileting: Total Assistance - Patient < 25%     Transfers Chair/bed transfer  Transfers assist     Chair/bed transfer assist level: Minimal Assistance - Patient > 75% (squat pivot)     Locomotion Ambulation  Ambulation assist      Assist level: 2 helpers (max a +w/c follow) Assistive device: Other (comment) (R hand rail) Max distance: 10   Walk 10 feet activity   Assist     Assist level: 2 helpers (max a +w/c follow) Assistive device: Other (comment) (R hand rail)   Walk 50 feet activity   Assist Walk 50 feet with 2 turns activity did not occur: Safety/medical concerns (L hemiplegia and pt fatigue)         Walk 150 feet activity   Assist Walk 150 feet activity did not occur: Safety/medical concerns (L hemiplegia and pt fatigue)         Walk 10 feet on uneven surface  activity   Assist Walk 10 feet on uneven surfaces activity did not occur: Safety/medical concerns (L hemiplegia and pt fatigue)         Wheelchair     Assist Is the patient using a wheelchair?: Yes Type of Wheelchair: Manual     Wheelchair assist level: Dependent - Patient 0%      Wheelchair 50 feet with 2 turns activity    Assist        Assist Level: Dependent - Patient 0%   Wheelchair 150 feet activity     Assist      Assist Level: Dependent - Patient 0%   Blood pressure (!) 113/56, pulse 63, temperature 98.2 F (36.8 C), temperature source Oral, resp. rate 18, height 5\' 6"  (1.676 m), weight 64.4 kg, SpO2 97%.   Medical Problem List and Plan: 1. 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  Antithrombotics: -DVT/anticoagulation:  Pharmaceutical: Lovenox             -antiplatelet therapy: Aspirin 81 mg daily and Plavix 75 mg daily x 3 weeks then Plavix alone 3. Pain Management: Tylenol as needed  3/25- denies pain- con't regimen rpn  4. Mood/Behavior/Sleep: Provide emotional support             -antipsychotic agents: N/A 5. Neuropsych/cognition: This patient is capable of making decisions on her own behalf. 6. Skin/Wound Care: Routine skin checks 7. Fluids/Electrolytes/Nutrition: Routine and outs with follow-up chemistries 8.  Dysphagia.  Dysphagia #2 thin liquids.  Follow-up speech therapy  3/28- no change in diet so far 9.  Hyperlipidemia.  Crestor 10.  Recent History of cocaine/tobacco use.  UDS positive for cocaine.  Provide counseling, this has been a chronic issue , seen in PCP notes 11.  Hypothyroidism.  Synthroid 12.  Constipation.  MiraLAX daily, Senokot-S 1 tab twice daily   3/25- LBM last night after 10 days with no BM  3/26- LBM 2 nights ago- wll monitor- is on Senna 1 tab BID and Miralax- not receiving pain  meds, or Baclofen, so less chance of constipation from meds. 3/27- LBM 3 days ago- will give Sorbitol, and soap suds enema- and increase Senna to 2 tabs BID  3/28- 2 Bms yesterday after treatment 13. Dysarthria- hard to understand- SLP ordered 14. Polycythemia  3/25-  Hb 16.1- and BUN up to 18, so is a little dry- BUN was 9 prior.   3/26- will recheck labs Thursday  3/27- Back to 14.9 after drinking more 15. Blurry vision  3/27- will need Ophtho after d/c.      LOS: 4 days A FACE TO FACE EVALUATION WAS PERFORMED  Theresa Watkins 02/13/2024, 1:18 PM

## 2024-02-13 NOTE — Progress Notes (Signed)
 Occupational Therapy Session Note  Patient Details  Name: Theresa Watkins MRN: [id number] Date of Birth: 07/04/1966  Today's Date: 02/13/2024 OT Individual Time: 1914-7829 OT Individual Time Calculation (min): 58 min    Short Term Goals: Week 1:  OT Short Term Goal 1 (Week 1): Pt will complete PROM with LUE without VC OT Short Term Goal 2 (Week 1): Pt will use LUE as stabilizer during ADL tasks with Mod A OT Short Term Goal 3 (Week 1): Pt will complete shower transfer at Mod A with LRAD OT Short Term Goal 4 (Week 1): Pt will compplete UB dressing with himi technique at Mod A  Skilled Therapeutic Interventions/Progress Updates:    Pt sleeping upon arrival but easily aroused. OT intervention with focus on bed mobility, LB dressing, sitting balance, sit<>stand, stand pivot transfer, and self feeding to increase pt's independence with BADLs. Supine>sit EOB with min A. Static sitting balance with supervision. Pt required max A for LB dressing tasks with max A for standing balance to pull pants over hips. Stand pivot transfer to recliner with mod A and tactle cues for advancing LLE. Self feeding with supervision and MAX verbal cues for swallowing strategies. Pt remained seated in recliner with NT present to finish eating breakfast.   Therapy Documentation Precautions:  Precautions Precautions: Fall Precaution/Restrictions Comments: L hemi Restrictions Weight Bearing Restrictions Per Provider Order: No Pain: Pt c/o 8/10 HA; meds admin during session Other Treatments:     Therapy/Group: Individual Therapy  Rich Brave 02/13/2024, 8:49 AM

## 2024-02-13 NOTE — Progress Notes (Signed)
 Physical Therapy Session Note  Patient Details  Name: Theresa Watkins MRN: [id number] Date of Birth: 29-May-1966  Today's Date: 02/13/2024 PT Individual Time: 1020-1103 PT Individual Time Calculation (min): 43 min   Short Term Goals: Week 1:  PT Short Term Goal 1 (Week 1): Pt will perform squat pivot tx with CGA bed<>chair PT Short Term Goal 2 (Week 1): Pt will ambulate 37' with maxA and LRAD PT Short Term Goal 3 (Week 1): Pt will propel w/c 150' with SBA  Skilled Therapeutic Interventions/Progress Updates: Pt presented in bed agreeable to therapy. Pt denies pain but states poor sleep due to many interruptions over night. Pt completed bed mobility with use of bed features. Pt then noted to reach for footboard and scoot towards foot of bed. PTA donned shoes total A for time management. Pt noted to lean onto foot board and was somewhat impulsive reaching for w/c to attempt pivot. PTA noted that w/c would tip when pull w/c requesting pt to stop due to safety. Pt expressing that "it's fine" and would not listen to this therapist's recommendations. PTA therefore got behind pt and allowed pt to pull self into w/c with PTA guarding due to pt requiring several scoots/shifts with at one point buttocks straddling bed and chair. Pt also noted to require a significant amount of effort to complete task. Once completed PTA educated pt that therapists' role is to educate pt to complete tasks in safe and efficient manner of which transfer just performed was neither. Explained that goal is to be able to perform such tasks on own but need to allow therapists to educate as such. Pt verbalized understanding however would continue to benefit from continued reinforcement. Pt then propelled w/c via hemi technique to nsg station with minA and mod multimodal cues for sequencing as pt had difficulty coordinating upper and lower extremity. Pt expressing that she needed to change shoes as current one was uncomfortable. PTA  transported remaining distance to day room and PTA obtained water shoes and changed total A. Pt then set up on [employer] and worked on reciprocal activity and forced use of LLE performing 2 min cycles at 70cm/sec. Pt encouraged to push down through full range with LLE. Pt did require cues for sustained task as pt easily externally distracted. Pt transported back to room and completed squat pivot transfer to L with minA. Completed sit to supine with CGA and pt was able to reposition to comfort. Pt left in bed at end of session with bed alarm on, call bell within reach and needs met.      Therapy Documentation Precautions:  Precautions Precautions: Fall Precaution/Restrictions Comments: L hemi Restrictions Weight Bearing Restrictions Per Provider Order: No    Therapy/Group: Individual Therapy  Eldredge Veldhuizen 02/13/2024, 4:14 PM

## 2024-02-14 ENCOUNTER — Inpatient Hospital Stay (HOSPITAL_COMMUNITY)

## 2024-02-14 DIAGNOSIS — M25552 Pain in left hip: Secondary | ICD-10-CM | POA: Diagnosis not present

## 2024-02-14 DIAGNOSIS — I63511 Cerebral infarction due to unspecified occlusion or stenosis of right middle cerebral artery: Secondary | ICD-10-CM | POA: Diagnosis not present

## 2024-02-14 DIAGNOSIS — M1612 Unilateral primary osteoarthritis, left hip: Secondary | ICD-10-CM | POA: Diagnosis not present

## 2024-02-14 MED ORDER — DICLOFENAC SODIUM 1 % EX GEL
2.0000 g | Freq: Four times a day (QID) | CUTANEOUS | Status: DC
  Administered 2024-02-14 – 2024-04-14 (×92): 2 g via TOPICAL
  Filled 2024-02-14 (×3): qty 100

## 2024-02-14 NOTE — Progress Notes (Signed)
 Physical Therapy Session Note  Patient Details  Name: Theresa Watkins MRN: [id number] Date of Birth: November 23, 1965  Today's Date: 02/14/2024 PT Individual Time: 4098-1191 PT Individual Time Calculation (min): 70 min   Short Term Goals: Week 1:  PT Short Term Goal 1 (Week 1): Pt will perform squat pivot tx with CGA bed<>chair PT Short Term Goal 2 (Week 1): Pt will ambulate 67' with maxA and LRAD PT Short Term Goal 3 (Week 1): Pt will propel w/c 150' with SBA  Skilled Therapeutic Interventions/Progress Updates:  Pt was seen bedside in the pm. Pt performed bed mobility with contact guard and verbal cues with increased time. Pt performed multiple sit to stand transfers with/without rolling walker and min to mod A with verbal cues. Pt performed stand pivot transfers with mod A and verbal cues. Pt propelled w/c 150 feet x 2 with S to min A, pt able to propel w/c to up to 50 feet with S and R UE and LE. In gym worked on standing (blocked practice) 2 sets x 5 reps each with min to mod A. Attempted several stands with rolling walker and L hand orthosis as well as hemiwalker with min to mod A, while standing worked on weight shifting and pre gait activities. Pt returned to room following treatment. Pt left sitting up in bed with bed alarm and all needs within reach.   Therapy Documentation Precautions:  Precautions Precautions: Fall Precaution/Restrictions Comments: L hemi Restrictions Weight Bearing Restrictions Per Provider Order: No General:   Pain: No c/o pain. Pt c/o pain L hip with WB.  Therapy/Group: Individual Therapy  Rayford Halsted 02/14/2024, 3:34 PM

## 2024-02-14 NOTE — Procedures (Signed)
 Modified Barium Swallow Study  Patient Details  Name: Theresa Watkins MRN: [id number] Date of Birth: 1966/01/14  Today's Date: 02/14/2024  Modified Barium Swallow completed.  Full report located under Chart Review in the Imaging Section.  History of Present Illness Patient is a 58 y.o. female with PMH: prior CVA, HTN, HLD, hypothyroidism, cocaine abuse. She presented to the hospital on 02/03/24 after being found down by her son. She was brought to ER. In ER she had slurred speech and left sided weakness. CT head followed by MRI brain showed acute to subacute lacunar infarcts in the lateral right thalamus, posterior limb right external capsule but no hemorrhage or mass effect. Prior infarcts right caudate head/BG, right pons, left frontal punctate infarcts x 2 .  UDS positive for cocaine.   Clinical Impression Patient presents with moderate oral dysphagia with a functional pharyngeal phase. Oral phase is characterized by lingual weakness and subsequent incoordination/control of bolus resulting in occasional posterior spillage to the pyriform sinuses. Patient wears upper dentures, though none on the bottom, resulting in prolonged and occasionally incomplete mastication. Due to reduced sensation and weakness orally, patient with mild pocketing on L. Pharyngeal phase largely [facility] with no penetration/aspiration observed throughout all consistencies. Occasional pyriform sinus residuals, though cleared through spontaneous and/or additional swallows. Recommend continuation of dysphagia 2/thin diet with medications whole in puree. Patient should receive full supervision and follow standardized swallowing precautions including elimination of distractions, small bites/sips, slow rate and lingual/digital sweep.    Swallow Evaluation Recommendations Recommendations: PO diet PO Diet Recommendation: Dysphagia 2 (Finely chopped);Thin liquids (Level 0) Liquid Administration via: Cup Medication Administration:  Whole meds with puree Supervision: Patient able to self-feed;Full supervision/cueing for swallowing strategies Swallowing strategies  : Minimize environmental distractions;Slow rate;Small bites/sips;Check for pocketing or oral holding Postural changes: Position pt fully upright for meals Oral care recommendations: Oral care BID (2x/day)    Knox Cervi M.A., CCC-SLP 02/14/2024,12:26 PM

## 2024-02-14 NOTE — Progress Notes (Signed)
 Physical Therapy Session Note  Patient Details  Name: Theresa Watkins MRN: [id number] Date of Birth: 1966-05-18  Today's Date: 02/14/2024 PT Individual Time: 0802-0902 PT Individual Time Calculation (min): 60 min   Short Term Goals: Week 1:  PT Short Term Goal 1 (Week 1): Pt will perform squat pivot tx with CGA bed<>chair PT Short Term Goal 2 (Week 1): Pt will ambulate 73' with maxA and LRAD PT Short Term Goal 3 (Week 1): Pt will propel w/c 150' with SBA  Skilled Therapeutic Interventions/Progress Updates:  Pt was seen bedside in the am. Pt willing to participate with therapy. Pt transferred supine to edge of bed with contact guard and side rail with increased time. Pt tolerated edge of bed with close supervision and verbal cues with R UE support. Pt requires verbal cues to maintain sitting balance. Pt transferred edge of bed to w/c with min to mod A and verbal cues. Pt propelled w/c about 150 feet with R UE and LE, pt was able to propelled w/c about 25 feet multiple times with S and verbal cues, but then required min A to restart and for sequencing. In gym treatment initially focused on LE strengthening and motor control, performed L LE wth 2 lbs and R LE, 3 sets x 10 reps each, hip flex and LAQs. Pt stood x 1 without assistive device and mod A with verbal cues. Pt transferred sit to stand x 2 with hemiwalker and min to mod A with verbal cues. Pt returned to room. Pt transferred w/c to edge of bed with mod A and verbal cues. Pt transferred edge of bed to supine with min A. Pt left sitting up in bed with all needs within reach and bed alarm on.   Therapy Documentation Precautions:  Precautions Precautions: Fall Precaution/Restrictions Comments: L hemi Restrictions Weight Bearing Restrictions Per Provider Order: No General:   Pain: Pt c/o 8/10 L hip pain notified pt's nurse at end of treatment.   Therapy/Group: Individual Therapy  Rayford Halsted 02/14/2024, 12:15 PM

## 2024-02-14 NOTE — Progress Notes (Signed)
 PROGRESS NOTE   Subjective/Complaints:  Bradycardia 50s, otherwise vitals stable Incontinent bladder overnight, LBM 3/28, medium Patient reports L hip pain since her stroke and subsequent fall; radiates into low back and groin; has not been addressed  Incontinence baseline since last stroke in 2017; does well with briefs for intermittent incontinence.  Reporting worsening vision, unable to read since her accicent  ROS: Limited due to severe dysarthria and some cognitive deficits.   Objective:   No results found. Recent Labs    02/12/24 0550  WBC 5.2  HGB 14.9  HCT 46.0  PLT 183   Recent Labs    02/12/24 0550  NA 141  K 3.8  CL 107  CO2 23  GLUCOSE 97  BUN 18  CREATININE 0.78  CALCIUM 8.8*    Intake/Output Summary (Last 24 hours) at 02/14/2024 0839 Last data filed at 02/14/2024 0741 Gross per 24 hour  Intake 560 ml  Output --  Net 560 ml        Physical Exam: Vital Signs Blood pressure 134/66, pulse (!) 55, temperature 98 F (36.7 C), resp. rate 18, height 5\' 6"  (1.676 m), weight 64.4 kg, SpO2 99%.   General: awake, alert, appropriate,  supine in bed; NAD HEENT: Atraumatic, normocephalic. Eyes: L Exotropia with difficulty tracking to the right or converging, blurry in lateral > medial fields  CV: regular rate; no JVD Pulmonary: CTA B/L; no W/R/R- good air movement GI: soft, NT, ND, (+)BS Psychiatric: appropriate- frustrated due to dysarthria Neurological: severe dysarthria  Neurologic: Cranial nerves II through XII intact, motor strength is 5/5 in right , 2-/5 left  deltoid, bicep, tricep, grip, 5/5 Right, 3/5 left hip flexor, knee extensors, 5/5 RIght , 2- left ankle dorsiflexor and plantar flexor--unchanged 3-29: Sensory exam normal sensation to light touch in right upper and lower extremities, feels pinch LLE , no sensation to pinch in left fingers Cerebellar exam normal finger to nose to  finger RIght , unable to perform on left due to weakness Musculoskeletal:  + Pain and limited ROM in L hip internal >> extenal rotation; no deformity or crepitics.     Assessment/Plan: 1. Functional deficits which require 3+ hours per day of interdisciplinary therapy in a comprehensive inpatient rehab setting. Physiatrist is providing close team supervision and 24 hour management of active medical problems listed below. Physiatrist and rehab team continue to assess barriers to discharge/monitor patient progress toward functional and medical goals  Care Tool:  Bathing    Body parts bathed by patient: Left arm, Chest, Abdomen, Front perineal area, Right upper leg, Left upper leg, Right lower leg, Face   Body parts bathed by helper: Right arm, Buttocks, Left lower leg     Bathing assist Assist Level: Moderate Assistance - Patient 50 - 74%     Upper Body Dressing/Undressing Upper body dressing   What is the patient wearing?: Pull over shirt    Upper body assist Assist Level: Maximal Assistance - Patient 25 - 49%    Lower Body Dressing/Undressing Lower body dressing            Lower body assist Assist for lower body dressing: Maximal Assistance - Patient 25 -  49%     Toileting Toileting    Toileting assist Assist for toileting: Total Assistance - Patient < 25%     Transfers Chair/bed transfer  Transfers assist     Chair/bed transfer assist level: Minimal Assistance - Patient > 75% (squat pivot)     Locomotion Ambulation   Ambulation assist      Assist level: 2 helpers (max a +w/c follow) Assistive device: Other (comment) (R hand rail) Max distance: 10   Walk 10 feet activity   Assist     Assist level: 2 helpers (max a +w/c follow) Assistive device: Other (comment) (R hand rail)   Walk 50 feet activity   Assist Walk 50 feet with 2 turns activity did not occur: Safety/medical concerns (L hemiplegia and pt fatigue)         Walk 150 feet  activity   Assist Walk 150 feet activity did not occur: Safety/medical concerns (L hemiplegia and pt fatigue)         Walk 10 feet on uneven surface  activity   Assist Walk 10 feet on uneven surfaces activity did not occur: Safety/medical concerns (L hemiplegia and pt fatigue)         Wheelchair     Assist Is the patient using a wheelchair?: Yes Type of Wheelchair: Manual    Wheelchair assist level: Dependent - Patient 0%      Wheelchair 50 feet with 2 turns activity    Assist        Assist Level: Dependent - Patient 0%   Wheelchair 150 feet activity     Assist      Assist Level: Dependent - Patient 0%   Blood pressure 134/66, pulse (!) 55, temperature 98 F (36.7 C), resp. rate 18, height 5\' 6"  (1.676 m), weight 64.4 kg, SpO2 99%.   Medical Problem List and Plan: 1. 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  Antithrombotics: -DVT/anticoagulation:  Pharmaceutical: Lovenox             -antiplatelet therapy: Aspirin 81 mg daily and Plavix 75 mg daily x 3 weeks then Plavix alone 3. Pain Management: Tylenol as needed  3/25- denies pain- con't regimen rpn  4. Mood/Behavior/Sleep: Provide emotional support             -antipsychotic agents: N/A 5. Neuropsych/cognition: This patient is capable of making decisions on her own behalf. 6. Skin/Wound Care: Routine skin checks 7. Fluids/Electrolytes/Nutrition: Routine and outs with follow-up chemistries 8.  Dysphagia.  Dysphagia #2 thin liquids.  Follow-up speech therapy  3/28- no change in diet so far 9.  Hyperlipidemia.  Crestor 10.   Recent History of cocaine/tobacco use.  UDS positive for cocaine.  Provide counseling, this has been a chronic issue , seen in PCP notes 11.  Hypothyroidism.  Synthroid 12.  Constipation.  MiraLAX daily, Senokot-S 1 tab twice daily   3/25- LBM last night after 10 days with no BM  3/26- LBM 2 nights ago- wll monitor- is on Senna 1 tab BID  and Miralax- not receiving pain meds, or Baclofen, so less chance of constipation from meds. 3/27- LBM 3 days ago- will give Sorbitol, and soap suds enema- and increase Senna to 2 tabs BID  3/28- 2 Bms yesterday after treatment 13. Dysarthria- hard to understand- SLP ordered 14. Polycythemia  3/25- Hb 16.1- and BUN up to 18, so is a little dry- BUN was 9 prior.   3/26- will recheck labs Thursday  3/27- Back to 14.9 after drinking more  15. Blurry vision  3/27- will need Ophtho after d/c.   3-29: Seems to mostly be from the left eye, with difficulty converging and tracking; messaged OT and PT to trial taping versus alternating eye patch to help with convergence.  16.  Left hip pain.  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

## 2024-02-14 NOTE — Progress Notes (Signed)
 Occupational Therapy Session Note  Patient Details  Name: Theresa Watkins MRN: [id number] Date of Birth: 08-Sep-1966  Today's Date: 02/14/2024 OT Individual Time: 1300-1345 OT Individual Time Calculation (min): 45 min   Short Term Goals: Week 1:  OT Short Term Goal 1 (Week 1): Pt will complete PROM with LUE without VC OT Short Term Goal 2 (Week 1): Pt will use LUE as stabilizer during ADL tasks with Mod A OT Short Term Goal 3 (Week 1): Pt will complete shower transfer at Mod A with LRAD OT Short Term Goal 4 (Week 1): Pt will compplete UB dressing with himi technique at Mod A  Skilled Therapeutic Interventions/Progress Updates:    Pt greeted seated in recliner eating lunch. OT assessed vision with focus on L eye. L eye floating to the L and having difficulty converging. OT placed tape on lateral aspect of L lense to help encourage pt to keep her eye at midline. Pt reported improvement. OT issued soft tan theraputty and pink foam block. Worked on opening/closing hand to improve functional grasp. OT provided joint input to work on wrist extension, with some improvement in activation of wrist extensors with repetition. Utilized theraputty to also work on grasp/release by picking putty up off of table In smaller pieces. Moderate guided A to get L UE to the table, but then able to actively grasp/release. Pt left seated in recliner at end of session with alarm on, call bell in reach, and needs met.   Therapy Documentation Precautions:  Precautions Precautions: Fall Precaution/Restrictions Comments: L hemi Restrictions Weight Bearing Restrictions Per Provider Order: No Pain:  Denies pain  Therapy/Group: Individual Therapy  Mal Amabile 02/14/2024, 1:48 PM

## 2024-02-15 DIAGNOSIS — I63511 Cerebral infarction due to unspecified occlusion or stenosis of right middle cerebral artery: Secondary | ICD-10-CM | POA: Diagnosis not present

## 2024-02-15 NOTE — Plan of Care (Signed)
  Problem: RH BOWEL ELIMINATION Goal: RH STG MANAGE BOWEL WITH ASSISTANCE Description: STG Manage Bowel with toileting Assistance. Outcome: Progressing   Problem: RH SAFETY Goal: RH STG ADHERE TO SAFETY PRECAUTIONS W/ASSISTANCE/DEVICE Description: STG Adhere to Safety Precautions With cues Assistance/Device. Outcome: Progressing   Problem: RH PAIN MANAGEMENT Goal: RH STG PAIN MANAGED AT OR BELOW PT'S PAIN GOAL Description: Pain managed < 4 with prns Outcome: Progressing

## 2024-02-15 NOTE — Progress Notes (Signed)
 PROGRESS NOTE   Subjective/Complaints:  Patient reported improvement in vision with use of left lens taping with OT yesterday. Remains bradycardic, vitally stable. Mild headache overnight  Continues to complain of pain in her left hip, discussed results of x-ray yesterday, Voltaren gel for pain control.  Discussed possible outpatient injections for arthritis.  She is appreciative.  ROS: Limited due to severe dysarthria and some cognitive deficits.   Objective:   DG FEMUR 1V LEFT Result Date: 02/14/2024 CLINICAL DATA:  Acute left hip pain. EXAM: LEFT FEMUR 1 VIEW COMPARISON:  None Available. FINDINGS: There is no evidence of fracture or other focal bone lesions. Soft tissues are unremarkable. Moderate osteophyte formation is seen involving the left hip. 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 No fracture or dislocation. Electronically Signed   By: Lupita Raider M.D.   On: 02/14/2024 14:02   DG Swallowing Func-Speech Pathology Result Date: 02/14/2024 Table formatting from the original result was not included. Modified Barium Swallow Study Patient Details Name: Theresa Watkins MRN: [id number] Date of Birth: 1966-05-24 Today's Date: 02/14/2024 HPI/PMH: HPI: Patient is a 58 y.o. female with PMH: prior CVA, HTN, HLD, hypothyroidism, cocaine abuse. She presented to the hospital on 02/03/24 after being found down by her son. She was brought to ER. In ER she had slurred speech and left sided weakness. CT head followed by MRI brain showed acute to subacute lacunar infarcts in the lateral right thalamus, posterior limb right external capsule but no hemorrhage or mass effect. Prior infarcts right caudate head/BG, right pons, left frontal punctate infarcts x 2 .  UDS positive for cocaine. Clinical Impression: Patient presents with moderate oral dysphagia with a functional pharyngeal phase. Oral phase is characterized by lingual  weakness and subsequent incoordination/control of bolus resulting in occasional posterior spillage to the pyriform sinuses. Patient wears upper dentures, though none on the bottom, resulting in prolonged and occasionally incomplete mastication. Due to reduced sensation and weakness orally, patient with mild pocketing on L. Pharyngeal phase largely [employer] with no penetration/aspiration observed throughout all consistencies. Occasional pyriform sinus residuals, though cleared through spontaneous and/or additional swallows. Recommend continuation of dysphagia 2/thin diet with medications whole in puree. Patient should receive full supervision and follow standardized swallowing precautions including elimination of distractions, small bites/sips, slow rate and lingual/digital sweep. Factors that may increase risk of adverse event in presence of aspiration Rubye Oaks [employer] Clearance Coots 2021): No data recorded Recommendations/Plan: Swallowing Evaluation Recommendations Swallowing Evaluation Recommendations Recommendations: PO diet PO Diet Recommendation: Dysphagia 2 (Finely chopped); Thin liquids (Level 0) Liquid Administration via: Cup Medication Administration: Whole meds with puree Supervision: Patient able to self-feed; Full supervision/cueing for swallowing strategies Swallowing strategies  : Minimize environmental distractions; Slow rate; Small bites/sips; Check for pocketing or oral holding Postural changes: Position pt fully upright for meals Oral care recommendations: Oral care BID (2x/day) Treatment Plan Treatment Plan Treatment recommendations: Therapy as outlined in treatment plan below Follow-up recommendations: Outpatient SLP Recommendations Comment: n/a Functional status assessment: Patient has had a recent decline in their functional status and demonstrates the ability to make significant improvements in function in a reasonable and predictable amount of time. Treatment frequency: Min 2x/week Treatment  duration: 2 weeks  Interventions: Aspiration precaution training; Oropharyngeal exercises; Compensatory techniques; Patient/family education; Diet toleration management by SLP; Respiratory muscle strength training; Trials of upgraded texture/liquids Recommendations Recommendations for follow up therapy are one component of a multi-disciplinary discharge planning process, led by the attending physician.  Recommendations may be updated based on patient status, additional functional criteria and insurance authorization. Assessment: Orofacial Exam: Orofacial Exam Oral Cavity: Oral Hygiene: WFL Oral Cavity - Dentition: Edentulous; Dentures, top Oral Motor/Sensory Function: Suspected cranial nerve impairment (moderate-severe oral weakness) Anatomy: No data recorded Boluses Administered: Boluses Administered Boluses Administered: Thin liquids (Level 0); Mildly thick liquids (Level 2, nectar thick); Puree; Solid  Oral Impairment Domain: Oral Impairment Domain Lip Closure: No labial escape Tongue control during bolus hold: Posterior escape of less than half of bolus Bolus preparation/mastication: Disorganized chewing/mashing with solid pieces of bolus unchewed Bolus transport/lingual motion: Slow tongue motion; Repetitive/disorganized tongue motion Oral residue: Trace residue lining oral structures Location of oral residue : Tongue; Palate Initiation of pharyngeal swallow : Pyriform sinuses  Pharyngeal Impairment Domain: Pharyngeal Impairment Domain Soft palate elevation: No bolus between soft palate (SP)/pharyngeal wall (PW) Laryngeal elevation: Complete superior movement of thyroid cartilage with complete approximation of arytenoids to epiglottic petiole Anterior hyoid excursion: Complete anterior movement Epiglottic movement: Complete inversion Laryngeal vestibule closure: Complete, no air/contrast in laryngeal vestibule Pharyngeal stripping wave : Present - complete Pharyngeal contraction (A/P view only): N/A Pharyngoesophageal segment  opening: Complete distension and complete duration, no obstruction of flow Tongue base retraction: No contrast between tongue base and posterior pharyngeal wall (PPW) Pharyngeal residue: Trace residue within or on pharyngeal structures Location of pharyngeal residue: Pyriform sinuses  Esophageal Impairment Domain: Esophageal Impairment Domain Esophageal clearance upright position: -- (not observed) Pill: Pill Consistency administered: Puree Puree: WFL Penetration/Aspiration Scale Score: Penetration/Aspiration Scale Score 1.  Material does not enter airway: Thin liquids (Level 0); Puree; Solid; Mildly thick liquids (Level 2, nectar thick); Pill Compensatory Strategies: Compensatory Strategies Compensatory strategies: No   General Information: No data recorded Diet Prior to this Study: Dysphagia 2 (finely chopped); Thin liquids (Level 0)   No data recorded  No data recorded  Supplemental O2: None (Room air)   No data recorded Behavior/Cognition: Alert; Cooperative; Pleasant mood Self-Feeding Abilities: Able to self-feed Baseline vocal quality/speech: Other (comment) (poor respiratory support for speech, imprecise articulation) Volitional Cough: Able to elicit Volitional Swallow: Able to elicit Exam Limitations: No limitations Goal Planning: Prognosis for improved oropharyngeal function: Good No data recorded Barriers/Prognosis Comment: n/a Patient/Family Stated Goal: patient wants her speech to improve Consulted and agree with results and recommendations: Patient Pain: No data recorded End of Session: Start Time:No data recorded Stop Time: No data recorded Time Calculation:No data recorded Charges: No data recorded SLP visit diagnosis: SLP Visit Diagnosis: Dysarthria and anarthria (R47.1); Dysphagia, oropharyngeal phase (R13.12) Past Medical History: Past Medical History: Diagnosis Date  Abnormal Pap smear   Bronchitis   Fibroid   Headache(784.0)   Ovarian cyst   Plantar fasciitis   Seizures (HCC)   Stroke [facility])    Thyroid cancer (HCC) [address]11  Trichomonas   Urinary tract infection  Past Surgical History: Past Surgical History: Procedure Laterality Date  ABDOMINAL HYSTERECTOMY    CESAREAN SECTION    goiter    removed  laparoscopic  1988  removal  of ectopic preg, ruptured tube  THERAPEUTIC ABORTION    THYROIDECTOMY  march 2011  cancer Cassidi F [place] 02/14/2024, 12:41 PM  No results for input(s): "WBC", "HGB", "HCT", "PLT"  in the last 72 hours.  No results for input(s): "NA", "K", "CL", "CO2", "GLUCOSE", "BUN", "CREATININE", "CALCIUM" in the last 72 hours.   Intake/Output Summary (Last 24 hours) at 02/15/2024 0954 Last data filed at 02/14/2024 1817 Gross per 24 hour  Intake 951 ml  Output --  Net 951 ml        Physical Exam: Vital Signs Blood pressure 107/73, pulse (!) 57, temperature 97.8 F (36.6 C), resp. rate 18, height 5\' 6"  (1.676 m), weight 64.4 kg, SpO2 95%.   General: awake, alert, appropriate,  supine in bed; NAD HEENT: Atraumatic, normocephalic. Eyes: L Exotropia with difficulty tracking to the right or converging, blurry in lateral > medial fields  CV: regular rate; no JVD Pulmonary: CTA B/L; no W/R/R- good air movement GI: soft, NT, ND, (+)BS Psychiatric: appropriate- frustrated due to dysarthria Neurological: severe dysarthria  Neurologic: Cranial nerves II through XII intact, motor strength is 5/5 in right , 2-/5 left  deltoid, bicep, tricep, grip, 5/5 Right, 3/5 left hip flexor, knee extensors, 5/5 RIght , 2- left ankle dorsiflexor and plantar flexor  Sensory exam normal sensation to light touch in right upper and lower extremities, feels pinch LLE , no sensation to pinch in left fingers Cerebellar exam normal finger to nose to finger RIght , unable to perform on left due to weakness Musculoskeletal:  + Pain and limited ROM in L hip internal >> extenal rotation; no deformity or crepitus.  FABER, FADIR with groin pain.   Unchanged 3-30  Assessment/Plan: 1. Functional  deficits which require 3+ hours per day of interdisciplinary therapy in a comprehensive inpatient rehab setting. Physiatrist is providing close team supervision and 24 hour management of active medical problems listed below. Physiatrist and rehab team continue to assess barriers to discharge/monitor patient progress toward functional and medical goals  Care Tool:  Bathing    Body parts bathed by patient: Left arm, Chest, Abdomen, Front perineal area, Right upper leg, Left upper leg, Right lower leg, Face   Body parts bathed by helper: Right arm, Buttocks, Left lower leg     Bathing assist Assist Level: Moderate Assistance - Patient 50 - 74%     Upper Body Dressing/Undressing Upper body dressing   What is the patient wearing?: Pull over shirt    Upper body assist Assist Level: Maximal Assistance - Patient 25 - 49%    Lower Body Dressing/Undressing Lower body dressing            Lower body assist Assist for lower body dressing: Maximal Assistance - Patient 25 - 49%     Toileting Toileting    Toileting assist Assist for toileting: Total Assistance - Patient < 25%     Transfers Chair/bed transfer  Transfers assist     Chair/bed transfer assist level: Moderate Assistance - Patient 50 - 74%     Locomotion Ambulation   Ambulation assist      Assist level: 2 helpers (max a +w/c follow) Assistive device: Other (comment) (R hand rail) Max distance: 10   Walk 10 feet activity   Assist     Assist level: 2 helpers (max a +w/c follow) Assistive device: Other (comment) (R hand rail)   Walk 50 feet activity   Assist Walk 50 feet with 2 turns activity did not occur: Safety/medical concerns (L hemiplegia and pt fatigue)         Walk 150 feet activity   Assist Walk 150 feet activity did not occur: Safety/medical concerns (L hemiplegia and  pt fatigue)         Walk 10 feet on uneven surface  activity   Assist Walk 10 feet on uneven surfaces  activity did not occur: Safety/medical concerns (L hemiplegia and pt fatigue)         Wheelchair     Assist Is the patient using a wheelchair?: Yes Type of Wheelchair: Manual    Wheelchair assist level: Supervision/Verbal cueing, Minimal Assistance - Patient > 75% Max wheelchair distance: 150    Wheelchair 50 feet with 2 turns activity    Assist        Assist Level: Supervision/Verbal cueing   Wheelchair 150 feet activity     Assist      Assist Level: Minimal Assistance - Patient > 75%   Blood pressure 107/73, pulse (!) 57, temperature 97.8 F (36.6 C), resp. rate 18, height 5\' 6"  (1.676 m), weight 64.4 kg, SpO2 95%.   Medical Problem List and Plan: 1. 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  Antithrombotics: -DVT/anticoagulation:  Pharmaceutical: Lovenox             -antiplatelet therapy: Aspirin 81 mg daily and Plavix 75 mg daily x 3 weeks then Plavix alone 3. Pain Management: Tylenol as needed  3/25- denies pain- con't regimen rpn   3-30: Ongoing left hip pain; Voltaren gel and Tylenol.  4. Mood/Behavior/Sleep: Provide emotional support             -antipsychotic agents: N/A 5. Neuropsych/cognition: This patient is capable of making decisions on her own behalf. 6. Skin/Wound Care: Routine skin checks 7. Fluids/Electrolytes/Nutrition: Routine and outs with follow-up chemistries 8.  Dysphagia.  Dysphagia #2 thin liquids.  Follow-up speech therapy  3/28- no change in diet so far 9.  Hyperlipidemia.  Crestor 10.  Recent History of cocaine/tobacco use.  UDS positive for cocaine.   Provide counseling, this has been a chronic issue , seen in PCP notes 11.  Hypothyroidism.  Synthroid 12.  Constipation.  MiraLAX daily, Senokot-S 1 tab twice daily   3/25- LBM last night after 10 days with no BM  3/26- LBM 2 nights ago- wll monitor- is on Senna 1 tab BID and Miralax- not receiving pain meds, or Baclofen, so less chance of constipation from meds. 3/27- LBM 3 days ago- will give Sorbitol, and soap suds enema- and increase Senna to 2 tabs BID  3/28- 2 Bms yesterday after treatment Last BM 3-30, medium  13. Dysarthria- hard to understand- SLP ordered 14. Polycythemia  3/25- Hb 16.1- and BUN up to 18, so is a little dry- BUN was 9 prior.   3/26- will recheck labs Thursday  3/27- Back to 14.9 after drinking more  15. Blurry vision  3/27- will need Ophtho after d/c.   3-29: Seems to mostly be from the left eye, with difficulty converging and tracking; messaged OT and PT to trial taping versus alternating eye patch to help with convergence.  3-30: Reporting improvement with use of taping  16.  Left hip pain.  -  3-29 x-ray showing moderate degenerative joint disease of the hip; no fracture  -Added Voltaren gel to hip 4 times daily  -May benefit from steroid injection as an outpatient  LOS: 6 days A FACE TO FACE EVALUATION WAS PERFORMED  Angelina Sheriff 02/15/2024, 9:54 AM

## 2024-02-16 DIAGNOSIS — I63511 Cerebral infarction due to unspecified occlusion or stenosis of right middle cerebral artery: Secondary | ICD-10-CM | POA: Diagnosis not present

## 2024-02-16 MED ORDER — CALCIUM CARBONATE ANTACID 500 MG PO CHEW
1.0000 | CHEWABLE_TABLET | Freq: Two times a day (BID) | ORAL | Status: DC
  Administered 2024-02-16 – 2024-02-21 (×9): 200 mg via ORAL
  Filled 2024-02-16 (×10): qty 1

## 2024-02-16 MED ORDER — VITAMIN D 25 MCG (1000 UNIT) PO TABS
1000.0000 [IU] | ORAL_TABLET | Freq: Every day | ORAL | Status: DC
  Administered 2024-02-16 – 2024-04-28 (×72): 1000 [IU] via ORAL
  Filled 2024-02-16 (×74): qty 1

## 2024-02-16 NOTE — Progress Notes (Signed)
 Speech Language Pathology Daily Session Note  Patient Details  Name: Theresa Watkins MRN: [id number] Date of Birth: 1966-01-17  Today's Date: 02/16/2024 SLP Individual Time: 1500-1530  30 mins  Short Term Goals: Week 1: SLP Short Term Goal 1 (Week 1): Pt will trial Dys 3 textures with SLP only demonstrating timely and functional oral phase of swallow provided min-mod A cues SLP Short Term Goal 2 (Week 1): Pt will utilize multimodal communication to express basic wants and needs with sup A SLP Short Term Goal 3 (Week 1): Patient will utilize speech intelligibility strategies at the phrase level to achieve 75% intelligibility with mod A SLP Short Term Goal 4 (Week 1): Patient will utilize swallowing compensatory strategies to reduce s/sx of aspiration during consumption of thin liquids given min multimodal A  Skilled Therapeutic Interventions:   Pt greeted at bedside. She was asleep upon SLP arrival, but woke easily for tx targeting dysphagia and dysarthria. She was able to recall 1/4 compensatory speech strategies independently, increasing to 2/4 w/ modA. She also benefited from errorless learning to review dysphagia strategies. SLP provided further explanation re speech subsystems; emphasizing breath/speech coordination and reduced muscle tension. Attempted to utilize easy onset phonation across 5 sustained /a/ trials, though harsh vocal quality continued. She was able to ID "harsh" and "tense" speech during 3/5 trials. Success increased w/ pt preferred music, as pt was able to spontaneously utilize easy onset speech to hum and sing along. At the end of tx tasks, she was left in her bed with the alarm set and call light within reach. Will continue to target pt awareness of errors and reduced harsh vocal quality. Recommend cont ST per POC.   Pain  Pt reported L leg pain 50/10. Pain medication provided prior to tx session. See EMR for more info. Assisted pt w/ repositioning.   Therapy/Group:  Individual Therapy  Pati Gallo 02/16/2024, 4:43 PM

## 2024-02-16 NOTE — Progress Notes (Signed)
 PROGRESS NOTE   Subjective/Complaints:  Pt reports having to use Proval cup, which wasn't in paperwork that I could find- was worried couldn't get another water /fluid in. Asking if needs IVFs?   Per SLP- not clear if needs Provale cupDenyse Amass doesn't think so, but checking with Cassidi.   Pt wants Vit D and Ca to be restarted since they've helped the hip pain in past.     ROS: Limited due to severe dysarthria and some cognitive deficits   Objective:   DG FEMUR 1V LEFT Result Date: 02/14/2024 CLINICAL DATA:  Acute left hip pain. EXAM: LEFT FEMUR 1 VIEW COMPARISON:  None Available. FINDINGS: There is no evidence of fracture or other focal bone lesions. Soft tissues are unremarkable. Moderate osteophyte formation is seen involving the left hip. 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 No fracture or dislocation. Electronically Signed   By: Lupita Raider M.D.   On: 02/14/2024 14:02   No results for input(s): "WBC", "HGB", "HCT", "PLT" in the last 72 hours.  No results for input(s): "NA", "K", "CL", "CO2", "GLUCOSE", "BUN", "CREATININE", "CALCIUM" in the last 72 hours.   Intake/Output Summary (Last 24 hours) at 02/16/2024 1033 Last data filed at 02/15/2024 1806 Gross per 24 hour  Intake 457 ml  Output --  Net 457 ml        Physical Exam: Vital Signs Blood pressure 124/73, pulse (!) 55, temperature 98.6 F (37 C), resp. rate 18, height 5\' 6"  (1.676 m), weight 64.4 kg, SpO2 96%.      General: awake, alert, appropriate, sitting up in bed; NAD HENT: L exotropia-  gaze; oropharynx moist- managing saliva CV: regular rhythm, bradycardic rate; no JVD Pulmonary: CTA B/L; no W/R/R- good air movement GI: soft, NT, ND, (+)BS- normoactive Psychiatric: appropriate Neurological: severe dysarthria- hard to understand  Neurologic: Cranial nerves II through XII intact, motor strength is 5/5 in right , 2-/5  left  deltoid, bicep, tricep, grip, 5/5 Right, 3/5 left hip flexor, knee extensors, 5/5 RIght , 2- left ankle dorsiflexor and plantar flexor  Sensory exam normal sensation to light touch in right upper and lower extremities, feels pinch LLE , no sensation to pinch in left fingers Cerebellar exam normal finger to nose to finger RIght , unable to perform on left due to weakness Musculoskeletal:  + Pain and limited ROM in L hip internal >> extenal rotation; no deformity or crepitus.  FABER, FADIR with groin pain.   Unchanged 3-30  Assessment/Plan: 1. Functional deficits which require 3+ hours per day of interdisciplinary therapy in a comprehensive inpatient rehab setting. Physiatrist is providing close team supervision and 24 hour management of active medical problems listed below. Physiatrist and rehab team continue to assess barriers to discharge/monitor patient progress toward functional and medical goals  Care Tool:  Bathing    Body parts bathed by patient: Left arm, Chest, Abdomen, Front perineal area, Right upper leg, Left upper leg, Face   Body parts bathed by helper: Right arm, Buttocks, Right lower leg, Left lower leg     Bathing assist Assist Level: Moderate Assistance - Patient 50 - 74%     Upper Body Dressing/Undressing  Upper body dressing   What is the patient wearing?: Pull over shirt    Upper body assist Assist Level: Moderate Assistance - Patient 50 - 74%    Lower Body Dressing/Undressing Lower body dressing      What is the patient wearing?: Underwear/pull up, Pants     Lower body assist Assist for lower body dressing: Maximal Assistance - Patient 25 - 49%     Toileting Toileting    Toileting assist Assist for toileting: Total Assistance - Patient < 25%     Transfers Chair/bed transfer  Transfers assist     Chair/bed transfer assist level: Moderate Assistance - Patient 50 - 74%     Locomotion Ambulation   Ambulation assist      Assist  level: 2 helpers (max a +w/c follow) Assistive device: Other (comment) (R hand rail) Max distance: 10   Walk 10 feet activity   Assist     Assist level: 2 helpers (max a +w/c follow) Assistive device: Other (comment) (R hand rail)   Walk 50 feet activity   Assist Walk 50 feet with 2 turns activity did not occur: Safety/medical concerns (L hemiplegia and pt fatigue)         Walk 150 feet activity   Assist Walk 150 feet activity did not occur: Safety/medical concerns (L hemiplegia and pt fatigue)         Walk 10 feet on uneven surface  activity   Assist Walk 10 feet on uneven surfaces activity did not occur: Safety/medical concerns (L hemiplegia and pt fatigue)         Wheelchair     Assist Is the patient using a wheelchair?: Yes Type of Wheelchair: Manual    Wheelchair assist level: Supervision/Verbal cueing, Minimal Assistance - Patient > 75% Max wheelchair distance: 150    Wheelchair 50 feet with 2 turns activity    Assist        Assist Level: Supervision/Verbal cueing   Wheelchair 150 feet activity     Assist      Assist Level: Minimal Assistance - Patient > 75%   Blood pressure 124/73, pulse (!) 55, temperature 98.6 F (37 C), resp. rate 18, height 5\' 6"  (1.676 m), weight 64.4 kg, SpO2 96%.   Medical Problem List and Plan: 1. 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  Antithrombotics: -DVT/anticoagulation:  Pharmaceutical: Lovenox             -antiplatelet therapy: Aspirin 81 mg daily and Plavix 75 mg daily x 3 weeks then Plavix alone 3. Pain Management:  Tylenol as needed  3/25- denies pain- con't regimen rpn   3-30: Ongoing left hip pain; Voltaren gel and Tylenol.  3/31- will start Vit D and Ca per pt request- has helped hip pain in past? 4. Mood/Behavior/Sleep: Provide emotional support             -antipsychotic agents: N/A 5. Neuropsych/cognition: This patient is capable of making decisions on her own behalf. 6. Skin/Wound Care: Routine skin checks 7.  Fluids/Electrolytes/Nutrition: Routine and outs with follow-up chemistries 8.  Dysphagia.  Dysphagia #2 thin liquids.  Follow-up speech therapy  3/28- no change in diet so far  3/31- Pt doesn't need provale cup- will let staff know 9.  Hyperlipidemia.  Crestor 10.  Recent History of cocaine/tobacco use.  UDS positive for cocaine.  Provide counseling, this has been a chronic issue , seen in PCP notes 11.  Hypothyroidism.  Synthroid 12.  Constipation.  MiraLAX daily, Senokot-S 1 tab twice daily   3/25- LBM last night after 10 days with no BM  3/26- LBM 2 nights ago- wll monitor- is on Senna 1 tab BID and Miralax- not receiving pain meds, or Baclofen, so less chance of constipation from meds. 3/27- LBM 3 days ago- will give Sorbitol, and soap suds enema- and increase Senna to 2 tabs BID  3/28- 2 Bms yesterday after treatment Last BM 3-30, medium  13. Dysarthria- hard to understand- SLP ordered 14. Polycythemia  3/25- Hb 16.1- and BUN up to 18, so is a little dry- BUN was 9 prior.   3/26- will recheck labs Thursday  3/27- Back to 14.9 after drinking more  3/31- will check labs today 15. Blurry vision  3/27- will need Ophtho after d/c.   3-29: Seems to mostly be from the left eye, with difficulty converging and tracking; messaged OT and PT to trial taping versus alternating eye patch to help with convergence.  3-30: Reporting improvement with use of taping  16.  Left hip pain.  -3-29 x-ray showing moderate degenerative joint disease of the hip; no fracture  -Added Voltaren gel to hip 4  times daily  -May benefit from steroid injection as an outpatient  3/31- started Vit D and Ca per pt request 17. Will check labs tomorrow to make sure not dry.    I spent a total of 41   minutes on total care today- >50% coordination of care- due to  D/w SLP and OT about pt and provale cup- doesn't need- will let nursing know   LOS: 7 days A FACE TO FACE EVALUATION WAS PERFORMED  Theresa Watkins 02/16/2024, 10:33 AM

## 2024-02-16 NOTE — Progress Notes (Signed)
 Occupational Therapy Weekly Progress Note  Patient Details  Name: Theresa Watkins MRN: [id number] Date of Birth: Mar 22, 1966  Beginning of progress report period: February 10, 2024 End of progress report period: February 16, 2024   Patient has met 2 of 4 short term goals.  Pt is making steady progress with BADLs and functional transfers since admission. Pt currently requires mod A for bathing at shower level, mod A for UB dressing, and max A for LB dressing tasks. Pt requires max verbal cues for utilizing hemi dressing technques. Max verbal cues for attention to LUE. PT with active movement in LUE and using as gross stabilizer with mod A and max verbal cues. Pt continues to demonstrate increased impulsivity and requires max verbal cues for safety, especially during transitional movements.   Patient continues to demonstrate the following deficits: {impairments:3041632} and therefore will continue to benefit from skilled OT intervention to enhance overall performance with {ADL/iADL:3041649}.  Patient {LTG progression:3041653}.  {plan of WUJW:1191478}  OT Short Term Goals Week 1:  OT Short Term Goal 1 (Week 1): Pt will complete PROM with LUE without VC OT Short Term Goal 1 - Progress (Week 1): Progressing toward goal OT Short Term Goal 2 (Week 1): Pt will use LUE as stabilizer during ADL tasks with Mod A OT Short Term Goal 2 - Progress (Week 1): Met OT Short Term Goal 3 (Week 1): Pt will complete shower transfer at Mod A with LRAD OT Short Term Goal 3 - Progress (Week 1): Progressing toward goal OT Short Term Goal 4 (Week 1): Pt will compplete UB dressing with himi technique at Mod A OT Short Term Goal 4 - Progress (Week 1): Met Week 2:  OT Short Term Goal 1 (Week 2): Pt will complete PROM with LUE without VC OT Short Term Goal 2 (Week 2): Pt will complete shower transfer at Mod A with LRAD OT Short Term Goal 3 (Week 2): Pt will complete toilet transfers with mod A OT Short Term Goal 4 (Week 2):  Pt will complete toileting tasks with mod A OT Short Term Goal 5 (Week 2): Pt will complete LB dressing tasks with mod A   Rich Brave 02/16/2024, 11:12 AM

## 2024-02-16 NOTE — Progress Notes (Signed)
 Physical Therapy Session Note  Patient Details  Name: Theresa Watkins MRN: [id number] Date of Birth: Jul 13, 1966  Today's Date: 02/16/2024 PT Individual Time: 1015-1100 PT Individual Time Calculation (min): 45 min   Short Term Goals: Week 1:  PT Short Term Goal 1 (Week 1): Pt will perform squat pivot tx with CGA bed<>chair PT Short Term Goal 2 (Week 1): Pt will ambulate 76' with maxA and LRAD PT Short Term Goal 3 (Week 1): Pt will propel w/c 150' with SBA  Skilled Therapeutic Interventions/Progress Updates:    pt received in bed and agreeable to therapy. No pain at rest, pt reports 7/10 L hip pain in weight bearing. Nsg provided tylenol and pt reports pain dissipating with non WB exercise.   Supervision bed mobility with cues for safety. Modified Stand pivot transfer to w/c with min a. Pt propelled w/c with BLE with supervision x ~150 ft with x 1 rest break for energy management.  Attempted true stand pivot with therapist positioned in front of pt. Pt then c/o intolerable L hip pain. States she took supplements which prevented OA pain PTA, but hasn't since. States she has already discussed this with her MD.  Transitioned to nustep at level 4 for NWB exercise and benefit of reciprocal motion for gait training. X 3 min and x 7 min with 30 sec rest break. Pt returned to room and handed off to nsg at end of session for toileting.     Therapy Documentation Precautions:  Precautions Precautions: Fall Precaution/Restrictions Comments: L hemi Restrictions Weight Bearing Restrictions Per Provider Order: No General:       Therapy/Group: Individual Therapy  Juluis Rainier 02/16/2024, 10:45 AM

## 2024-02-16 NOTE — Plan of Care (Signed)
  Problem: RH BOWEL ELIMINATION Goal: RH STG MANAGE BOWEL WITH ASSISTANCE Description: STG Manage Bowel with toileting Assistance. Outcome: Progressing   Problem: RH SAFETY Goal: RH STG ADHERE TO SAFETY PRECAUTIONS W/ASSISTANCE/DEVICE Description: STG Adhere to Safety Precautions With cues Assistance/Device. Outcome: Progressing   Problem: RH PAIN MANAGEMENT Goal: RH STG PAIN MANAGED AT OR BELOW PT'S PAIN GOAL Description: Pain managed < 4 with prns Outcome: Progressing   Problem: RH KNOWLEDGE DEFICIT Goal: RH STG INCREASE KNOWLEDGE OF DYSPHAGIA/FLUID INTAKE Description: Patient and mother will be able to manage dysphagia using educational resources for medications and dietary modification independently Outcome: Progressing

## 2024-02-16 NOTE — Progress Notes (Signed)
 Occupational Therapy Session Note  Patient Details  Name: Theresa Watkins MRN: [id number] Date of Birth: June 27, 1966  Today's Date: 02/16/2024 OT Individual Time: 0700-0810 OT Individual Time Calculation (min): 70 min    Short Term Goals: Week 1:  OT Short Term Goal 1 (Week 1): Pt will complete PROM with LUE without VC OT Short Term Goal 2 (Week 1): Pt will use LUE as stabilizer during ADL tasks with Mod A OT Short Term Goal 3 (Week 1): Pt will complete shower transfer at Mod A with LRAD OT Short Term Goal 4 (Week 1): Pt will compplete UB dressing with himi technique at Mod A  Skilled Therapeutic Interventions/Progress Updates:    Pt resting in bed upon arrival. Initial focus on self feeding and adherence to swallowing strategies. TV turned off and no other distractions present. Pt required mod verbal cues for clearing and tongue sweep prior to taking additional food in mouth.   Supine>sit EOB with min A and HOB elevated. Bathing/dressing with sit<>stand from EOB. Focus on sitting balance and hemi dressing techniques. Pt required max verbal cues for hemi dressing techniques. UB dressing with mod A. LB dressing with max A. Sit<>stand from EOB using Stedy with CGA. Pt requires more then a reasonable amount of time to complete tasks. Pt transferred to recliner.  All needs within reach. Belt alarm activated.   Therapy Documentation Precautions:  Precautions Precautions: Fall Precaution/Restrictions Comments: L hemi Restrictions Weight Bearing Restrictions Per Provider Order: No Pain:  Pt c/o "migraine"; meds admin after therapy   Therapy/Group: Individual Therapy  Rich Brave 02/16/2024, 8:16 AM

## 2024-02-16 NOTE — Progress Notes (Signed)
 Speech Language Pathology Daily Session Note  Patient Details  Name: Theresa Watkins MRN: [id number] Date of Birth: [birth year]  Today's Date: 02/16/2024 SLP Individual Time: 0902-1003 SLP Individual Time Calculation (min): 61 min  Short Term Goals: Week 1: SLP Short Term Goal 1 (Week 1): Pt will trial Dys 3 textures with SLP only demonstrating timely and functional oral phase of swallow provided min-mod A cues SLP Short Term Goal 2 (Week 1): Pt will utilize multimodal communication to express basic wants and needs with sup A SLP Short Term Goal 3 (Week 1): Patient will utilize speech intelligibility strategies at the phrase level to achieve 75% intelligibility with mod A SLP Short Term Goal 4 (Week 1): Patient will utilize swallowing compensatory strategies to reduce s/sx of aspiration during consumption of thin liquids given min multimodal A  Skilled Therapeutic Interventions:  Patient was seen in am to address dysphagia management and speech intelligibility. Pt was alert and upright in recliner upon SLP arrival. Assigned NT present and assisting pt back to bed. Pt requesting to finish breakfast meal. SLP repositioned pt upright due to declining position. Pt administered Dys 2 trials and thin liquids. Pt warranting mod cues throughout meal for amount modifications, oral clearance with use of lingual sweeps and intermittent liquid washes. Lingual and buccal weakness observed as pt presented with consistent anterior spillage of liquids. X1 instance of coughing and pt subsequently requesting suction as pt stated, "I feel like a piece of grits is stuck in my throat." Given a puree wash (applesauce) Pt reports feeling subsided. Nursing in and out providing medication management with pt consuming meds whole in puree without difficulty. In other minutes of session, pt completed 5 sets of 5 EMST set at 35cm H2O. Pt unable to recall speech intelligibility strategies indep. SLP reviewed strategies of short  single phrases, breath support, and separating words. When engaged in spontaneous communication pt able to maintain speech intelligibility of 75% with min A. At conclusion of session, pt was left upright in bed with call button within reach and bed alarm active. SLP to continue POC.   Pain Pain Assessment Pain Scale: 0-10 Pain Score: 5  Pain Type: Acute pain Pain Location: Head  Therapy/Group: Individual Therapy  [name] 02/16/2024, 12:20 PM

## 2024-02-17 DIAGNOSIS — I63511 Cerebral infarction due to unspecified occlusion or stenosis of right middle cerebral artery: Secondary | ICD-10-CM | POA: Diagnosis not present

## 2024-02-17 LAB — COMPREHENSIVE METABOLIC PANEL WITH GFR
ALT: 16 U/L (ref 0–44)
AST: 13 U/L — ABNORMAL LOW (ref 15–41)
Albumin: 3.4 g/dL — ABNORMAL LOW (ref 3.5–5.0)
Alkaline Phosphatase: 41 U/L (ref 38–126)
Anion gap: 8 (ref 5–15)
BUN: 15 mg/dL (ref 6–20)
CO2: 24 mmol/L (ref 22–32)
Calcium: 8.7 mg/dL — ABNORMAL LOW (ref 8.9–10.3)
Chloride: 108 mmol/L (ref 98–111)
Creatinine, Ser: 0.69 mg/dL (ref 0.44–1.00)
GFR, Estimated: >60 mL/min (ref >60)
Glucose, Bld: 94 mg/dL (ref 70–99)
Potassium: 3.9 mmol/L (ref 3.5–5.1)
Sodium: 140 mmol/L (ref 135–145)
Total Bilirubin: 0.6 mg/dL (ref 0.0–1.2)
Total Protein: 6 g/dL — ABNORMAL LOW (ref 6.5–8.1)

## 2024-02-17 LAB — CBC WITH DIFFERENTIAL/PLATELET
Abs Immature Granulocytes: 0.01 10*3/uL (ref 0.00–0.07)
Basophils Absolute: 0 10*3/uL (ref 0.0–0.1)
Basophils Relative: 0 %
Eosinophils Absolute: 0.1 10*3/uL (ref 0.0–0.5)
Eosinophils Relative: 1 %
HCT: 42.3 % (ref 36.0–46.0)
Hemoglobin: 14.1 g/dL (ref 12.0–15.0)
Immature Granulocytes: 0 %
Lymphocytes Relative: 42 %
Lymphs Abs: 1.9 10*3/uL (ref 0.7–4.0)
MCH: 28.7 pg (ref 26.0–34.0)
MCHC: 33.3 g/dL (ref 30.0–36.0)
MCV: 86.2 fL (ref 80.0–100.0)
Monocytes Absolute: 0.4 10*3/uL (ref 0.1–1.0)
Monocytes Relative: 8 %
Neutro Abs: 2.1 10*3/uL (ref 1.7–7.7)
Neutrophils Relative %: 49 %
Platelets: 183 10*3/uL (ref 150–400)
RBC: 4.91 MIL/uL (ref 3.87–5.11)
RDW: 13.5 % (ref 11.5–15.5)
WBC: 4.5 10*3/uL (ref 4.0–10.5)
nRBC: 0 % (ref 0.0–0.2)

## 2024-02-17 NOTE — Progress Notes (Signed)
 Physical Therapy Weekly Progress Note  Patient Details  Name: Theresa Watkins MRN: [id number] Date of Birth: June 16, 1966  Beginning of progress report period: February 10, 2024 End of progress report period: February 17, 2024  Today's Date: 02/17/2024 PT Individual Time: 4098-1191, 4782-9562 PT Individual Time Calculation (min): 45 min, 45 min   Patient has met 3 of 3 short term goals.  Theresa Watkins is making steady progress toward her goals. She is participating in gait training with +2 assist up to 60 ft and can transfer with min a overall. She is limited by impulsivity at times and requires consistent cues to recall and utilize adaptations.   Patient continues to demonstrate the following deficits muscle weakness, impaired timing and sequencing and decreased coordination, and decreased sitting balance, decreased standing balance, hemiplegia, and decreased balance strategies and therefore will continue to benefit from skilled PT intervention to increase functional independence with mobility.  Patient progressing toward long term goals..  Continue plan of care.  PT Short Term Goals Week 1:  PT Short Term Goal 1 (Week 1): Pt will perform squat pivot tx with CGA bed<>chair PT Short Term Goal 1 - Progress (Week 1): Met PT Short Term Goal 2 (Week 1): Pt will ambulate 71' with maxA and LRAD PT Short Term Goal 2 - Progress (Week 1): Met PT Short Term Goal 3 (Week 1): Pt will propel w/c 150' with SBA PT Short Term Goal 3 - Progress (Week 1): Met Week 2:  PT Short Term Goal 1 (Week 2): pt will ambulate 100 ft with assistance PT Short Term Goal 2 (Week 2): Pt will perform bed/chair transfer with CGA consistently PT Short Term Goal 3 (Week 2): Pt will initiate stair training  Skilled Therapeutic Interventions/Progress Updates:    Session 1: pt received in recliner and agreeable to therapy. Pt with unrated pain in L hip with weight bearing. Activity modification as able, nsg made aware. Pt performed  modified stand pivot with min a, requiring assist to guide hips into chair. Pt propelled chair >100 ft with RLE, transported remaining distance for time. Pt performed block practice of stand pivot using rail for support, x 3 each direction. Easiest with hand rail on R side. Required consistent cueing for set up/foot position and safety with guiding hips into chair. Pt reports pain in hip is intolerable, transitioned to kinetron for global strength and endurance, 2 x 3 minutes. Pt returned to room d/t need for toileting. Performed near [facility] transfer using bathroom rail, but extended time for set up and cueing for safety. Pt left in care of nsg to complete toileting.    Session 2: pt received in bed and agreeable to therapy. Pt reports unrated hip pain, managed with activity modification.   Bed<>chair transfers with min a using RUE on chair arm/bedrail and cueing for set up/safety.   Pt participated in 2 x 60 ft of gait training using 3 musketeers set up. Pt demoes notable scissoring and L knee buckle, but can correct with cueing and focus on sequencing.   W/c propulsion x 50 ft with supervision for endurance and functional mobility.   Pt left in bed with all 4 rails at her request d/t feeling comfortable leaning at an angle, NT made aware of pt position.  Therapy Documentation Precautions:  Precautions Precautions: Fall Precaution/Restrictions Comments: L hemi Restrictions Weight Bearing Restrictions Per Provider Order: No General:      Therapy/Group: Individual Therapy  Juluis Rainier 02/17/2024, 9:36 AM

## 2024-02-17 NOTE — Progress Notes (Signed)
 Patient ID: Theresa Watkins, female   DOB: 05/27/1966, 58 y.o.   MRN: [id number]

## 2024-02-17 NOTE — Progress Notes (Signed)
 Occupational Therapy Session Note  Patient Details  Name: Theresa Watkins MRN: [id number] Date of Birth: [birth year]  Today's Date: 02/17/2024 OT Individual Time: 2952-8413 OT Individual Time Calculation (min): 75 min    Short Term Goals: Week 2:  OT Short Term Goal 1 (Week 2): Pt will complete PROM with LUE without VC OT Short Term Goal 2 (Week 2): Pt will complete shower transfer at Mod A with LRAD OT Short Term Goal 3 (Week 2): Pt will complete toilet transfers with mod A OT Short Term Goal 4 (Week 2): Pt will complete toileting tasks with mod A OT Short Term Goal 5 (Week 2): Pt will complete LB dressing tasks with mod A  Skilled Therapeutic Interventions/Progress Updates:    OT intervention with focus on bathing at shower level and dressing with sit<>stand with Stedy to increase indepencence with BADLs. Pt requires max verbal cues throughout session to attend to task, safety awareness, and hemi dressing techniques. Bathing with mod A at shower. UB dressing with mod A. LB dressing with max A. Pt with increased awareness of LUE during tasks and initiated funcitonal use. Pt remained in relciner with all needs within reach. Belt alarm activated.   Therapy Documentation Precautions:  Precautions Precautions: Fall Precaution/Restrictions Comments: L hemi Restrictions Weight Bearing Restrictions Per Provider Order: No Pain:  Pt denies pain this morning   Therapy/Group: Individual Therapy  Rich Brave 02/17/2024, 8:16 AM

## 2024-02-17 NOTE — Plan of Care (Signed)
  Problem: Consults Goal: RH STROKE PATIENT EDUCATION Description: See Patient Education module for education specifics  Outcome: Progressing   Problem: RH BOWEL ELIMINATION Goal: RH STG MANAGE BOWEL WITH ASSISTANCE Description: STG Manage Bowel with toileting Assistance. 02/17/2024 0602 by Marny Lowenstein, RN Outcome: Progressing 02/17/2024 0559 by Marny Lowenstein, RN Outcome: Progressing   Problem: RH SAFETY Goal: RH STG ADHERE TO SAFETY PRECAUTIONS W/ASSISTANCE/DEVICE Description: STG Adhere to Safety Precautions With cues Assistance/Device. 02/17/2024 0602 by Marny Lowenstein, RN Outcome: Progressing 02/17/2024 0559 by Marny Lowenstein, RN Outcome: Progressing   Problem: RH PAIN MANAGEMENT Goal: RH STG PAIN MANAGED AT OR BELOW PT'S PAIN GOAL Description: Pain managed < 4 with prns Outcome: Progressing

## 2024-02-17 NOTE — Patient Care Conference (Signed)
 Inpatient RehabilitationTeam Conference and Plan of Care Update Date: 02/17/2024   Time: 1103 am    Patient Name: Theresa Watkins      Medical Record Number: [id number]  Date of Birth: 10/30/1966 Sex: Female         Room/Bed: 4W05C/4W05C-01 Payor Info: Payor: [place] MEDICAID PREPAID HEALTH PLAN / Plan:  MEDICAID HEALTHY BLUE / Product Type: *No Product type* /    Admit Date/Time:  02/09/2024  6:29 PM  Primary Diagnosis:  Right middle cerebral artery stroke [facility])  Hospital Problems: Principal Problem:   Right middle cerebral artery stroke [facility])    Expected Discharge Date: Expected Discharge Date: 03/09/24  Team Members Present: Physician leading conference: Dr. Genice Rouge Nurse Present: Konrad Dolores, RN PT Present: Bernie Covey, PT OT Present: Roney Mans, OT;Ardis Rowan, COTA SLP Present: Everardo Pacific, SLP PPS Coordinator present : Fae Pippin, SLP     Current Status/Progress Goal Weekly Team Focus  Bowel/Bladder   Pt with incontinent episodes. LBM 03/30   Will regain B/B continence   Toilet pt q3-4 hours qshift    Swallow/Nutrition/ Hydration   Dys 2 and thin with FULL supervision   min A  compensatory strategeis, IOPI for tongue strength, EMST    ADL's   bathing-mod A; UB dressing-mod A; LB dressing-max Al squat pivot transfers-mod A; impulsive; active Lt shoulder and weak grasp   Min A-SBA   BADLs, LUE NMR, transfers, education    Mobility   bed mobility CGA, gait with +2 for w/c follow, transfers with min-CGA depending on set up, w/c with supervision ~150 ft   Supervision to min a overall  gait, transfers    Communication   mod to severe dysarthria and apraxia   min A   implement compensatory strategies, IOPI, and EMST    Safety/Cognition/ Behavioral Observations               Pain   Verbalizes pain to bilateral hips. Tylenol/voltaren gel to manage pain   Will be free from pain   Assess pain Qshift/prn    Skin   Skin is intact    Will maintain skin intergrity with no breakdown  Assess skin qshift/prn for breakdown and repositioned q2hours      Discharge Planning:  Pt will d/c to home with her mother that is not able to provide physical assistance. PRN support from her son that works. Will confirm there are no barriers to discharge.    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 Patient limited by left hemiparesis, severe left upper extremity sensory deficit , severe dysarthria, apraxia, dysphagia , hip pain , blurry vision and poor  safety awareness.    Patient on target to meet rehab goals: yes, Patient requires Mod A with  UB dressing and max assist  with LB dressing . Patient requires Mod assistance with  squat pivot transfers. Patient requires  +2 for w/c follow, transfers with min-CGA depending on set up, w/c with supervision ~150 ft. Overall goals are set for supervision- min A at discharge.   *See Care Plan and progress notes for long and short-term goals.   Revisions to Treatment Plan:  Eye taping Estim trial EMST IOPI   Teaching Needs: Safety, medications, toileting, transfers, polysubstance abuse education, smoking cessation education,. Etc.    Current Barriers to Discharge: Decreased caregiver support and Incontinence  Possible Resolutions to Barriers: Family education     Medical Summary Current  Status: LBM 2 days ago- incontirnent- Pain in L hip- poor sleep overnight- severe dysarthria- soaked every AM-  Barriers to Discharge: Behavior/Mood;Incontinence;Neurogenic Bowel & Bladder;Medical stability;Self-care education;Uncontrolled Pain  Barriers to Discharge Comments: limited by Severe dysarthria-  fait sitting balance- max VC's for hemidressing techniques-very  impulsive-poor safety awareness- can do some transfer, but needs Stedy with nursing- will need min A goals- D2 thin liquid Possible Resolutions to  [employer] Focus: need timed voiding; getting movement in L shoulder- estim trial; doing EMST and IOPI; d/c- 4/21   Continued Need for Acute Rehabilitation Level of Care: The patient requires daily medical management by a physician with specialized training in physical medicine and rehabilitation for the following reasons: Direction of a multidisciplinary physical rehabilitation program to maximize functional independence : Yes Medical management of patient stability for increased activity during participation in an intensive rehabilitation regime.: Yes Analysis of laboratory values and/or radiology reports with any subsequent need for medication adjustment and/or medical intervention. : Yes   I attest that I was present, lead the team conference, and concur with the assessment and plan of the team.   Gwenyth Allegra 02/17/2024, 1103 am

## 2024-02-17 NOTE — Progress Notes (Signed)
 Speech Language Pathology Weekly Progress and Session Note  Patient Details  Name: Theresa Watkins MRN: [id number] Date of Birth: Apr 17, 1966  Beginning of progress report period: February 10, 2024 End of progress report period: February 17, 2024  Today's Date: 02/17/2024 SLP Individual Time: 1210-1240 SLP Individual Time Calculation (min): 30 min  Short Term Goals: Week 1: SLP Short Term Goal 1 (Week 1): Pt will trial Dys 3 textures with SLP only demonstrating timely and functional oral phase of swallow provided min-mod A cues SLP Short Term Goal 1 - Progress (Week 1): Progressing toward goal SLP Short Term Goal 2 (Week 1): Pt will utilize multimodal communication to express basic wants and needs with sup A SLP Short Term Goal 2 - Progress (Week 1): Met SLP Short Term Goal 3 (Week 1): Patient will utilize speech intelligibility strategies at the phrase level to achieve 75% intelligibility with mod A SLP Short Term Goal 3 - Progress (Week 1): Met SLP Short Term Goal 4 (Week 1): Patient will utilize swallowing compensatory strategies to reduce s/sx of aspiration during consumption of thin liquids given min multimodal A SLP Short Term Goal 4 - Progress (Week 1): Met    New Short Term Goals: Week 2: SLP Short Term Goal 1 (Week 2): Patient will consume least restrictive diet with use of compensatory strategies given supervision multimodal A SLP Short Term Goal 2 (Week 2): Patient will increase speech intelligibility to 80% at the sentence level given min multimodal A  Weekly Progress Updates: Pt has made good gains and has met 3 of 4 STGs this reporting period due to improved use of swallowing compensatory strategies and increased speech intelligibility. Currently, patient continues to require minA for use of small bites/sips, slow rate, and lingual sweep to clear oral pocketing. Patient continues to required modA for use of speech intelligibility strategies to increase intelligibility to 75% at the  sentence level. Pt/family eduction ongoing. Pt would benefit from continued ST intervention to maximize dysphagia and dysarthria in order to maximize functional independence at d/c.   Intensity: Minumum of 1-2 x/day, 30 to 90 minutes Frequency: 3 to 5 out of 7 days Duration/Length of Stay: 4/22 Treatment/Interventions: Multimodal communication approach;Speech/Language facilitation;Functional tasks;Therapeutic Activities;Therapeutic Exercise;Internal/external aids;Dysphagia/aspiration precaution training;Patient/family education   Daily Session  Skilled Therapeutic Interventions:  Skilled therapy session focused on communication and dysphagia goals. Upon entrance, SLP assisted patient from toilet to bed via steady. Patient followed all single step directions independently. SLP then observed patient with D2/thin liquid lunch. Patient with mildly prolonged mastication, occasional anterior spillage and moderate oral residuals. Patient with x1 subtle throat clear which may be due to bolus misdirection. SLP provided min A for patient to take small, slow bites and utilize a lingual sweep to clear pocketing. Of note, upon entrance, patient with D3 solids on table. SLP educated patient on current diet and importance of adhering to SLP recommendations. Recommend continuation of current diet with mediciations whole in puree. Continue full supervision. SLP targeted speech intelligibility through review of compensatory strategies. Patient recalled 1/4 independently. Patient approximately ~75% intelligible at the phrase level this date. Patient left in bed with alarm set and call bell in reach. Continue POC.      Pain None reported  Therapy/Group: Individual Therapy  Theresa Watkins M.A., CCC-SLP 02/17/2024, 12:35 PM

## 2024-02-17 NOTE — Progress Notes (Signed)
 PROGRESS NOTE   Subjective/Complaints:  LBM 2 days ago- on toilet- trying to go On toilet currently- says coffee and breakfast usually work, but not so far this AM.   Made it sound like had BM yesterday, but per chart, has been since 3/30.  Also says Hip pain better with Vit D and Calcium- not sure why, but if pain better, I'm glad.     ROS: Limited by severe dysarthria  Objective:   No results found.  Recent Labs    02/17/24 0525  WBC 4.5  HGB 14.1  HCT 42.3  PLT 183    Recent Labs    02/17/24 0525  NA 140  K 3.9  CL 108  CO2 24  GLUCOSE 94  BUN 15  CREATININE 0.69  CALCIUM 8.7*     Intake/Output Summary (Last 24 hours) at 02/17/2024 0947 Last data filed at 02/17/2024 0839 Gross per 24 hour  Intake 480 ml  Output --  Net 480 ml        Physical Exam: Vital Signs Blood pressure 120/73, pulse (!) 57, temperature 98 F (36.7 C), resp. rate 16, height 5\' 6"  (1.676 m), weight 64.4 kg, SpO2 95%.      General: awake, alert, appropriate, on toilet- OT in room;  NAD HENT: conjugate gaze; oropharynx moist CV: regular rhythm, slightly bradycardic rate; no JVD Pulmonary: CTA B/L; no W/R/R- good air movement- no cough heard, sounds clear GI: soft, NT, ND, (+)BS- slightly hypoactive Psychiatric: appropriate Neurological: very dysarthric- doing a better job of speaking slower to be understood, but still extremely difficult to understand her   Neurologic: Cranial nerves II through XII intact, motor strength is 5/5 in right , 2-/5 left  deltoid, bicep, tricep, grip, 5/5 Right, 3/5 left hip flexor, knee extensors, 5/5 RIght , 2- left ankle dorsiflexor and plantar flexor  Sensory exam normal sensation to light touch in right upper and lower extremities, feels pinch LLE , no sensation to pinch in left fingers Cerebellar exam normal finger to nose to finger RIght , unable to perform on left due to  weakness Musculoskeletal:  + Pain and limited ROM in L hip internal >> extenal rotation; no deformity or crepitus.  FABER, FADIR with groin pain.   Unchanged 3-30  Assessment/Plan: 1. Functional deficits which require 3+ hours per day of interdisciplinary therapy in a comprehensive inpatient rehab setting. Physiatrist is providing close team supervision and 24 hour management of active medical problems listed below. Physiatrist and rehab team continue to assess barriers to discharge/monitor patient progress toward functional and medical goals  Care Tool:  Bathing    Body parts bathed by patient: Face, Left arm, Chest, Abdomen, Front perineal area, Right upper leg, Left upper leg, Right lower leg   Body parts bathed by helper: Right arm, Buttocks, Left lower leg     Bathing assist Assist Level: Moderate Assistance - Patient 50 - 74%     Upper Body Dressing/Undressing Upper body dressing   What is the patient wearing?: Pull over shirt    Upper body assist Assist Level: Moderate Assistance - Patient 50 - 74%    Lower Body Dressing/Undressing Lower body dressing  What is the patient wearing?: Underwear/pull up, Pants     Lower body assist Assist for lower body dressing: Maximal Assistance - Patient 25 - 49%     Toileting Toileting    Toileting assist Assist for toileting: Total Assistance - Patient < 25%     Transfers Chair/bed transfer  Transfers assist     Chair/bed transfer assist level: Moderate Assistance - Patient 50 - 74%     Locomotion Ambulation   Ambulation assist      Assist level: 2 helpers (max a +w/c follow) Assistive device: Other (comment) (R hand rail) Max distance: 10   Walk 10 feet activity   Assist     Assist level: 2 helpers (max a +w/c follow) Assistive device: Other (comment) (R hand rail)   Walk 50 feet activity   Assist Walk 50 feet with 2 turns activity did not occur: Safety/medical concerns (L hemiplegia and pt  fatigue)         Walk 150 feet activity   Assist Walk 150 feet activity did not occur: Safety/medical concerns (L hemiplegia and pt fatigue)         Walk 10 feet on uneven surface  activity   Assist Walk 10 feet on uneven surfaces activity did not occur: Safety/medical concerns (L hemiplegia and pt fatigue)         Wheelchair     Assist Is the patient using a wheelchair?: Yes Type of Wheelchair: Manual    Wheelchair assist level: Supervision/Verbal cueing, Minimal Assistance - Patient > 75% Max wheelchair distance: 150    Wheelchair 50 feet with 2 turns activity    Assist        Assist Level: Supervision/Verbal cueing   Wheelchair 150 feet activity     Assist      Assist Level: Minimal Assistance - Patient > 75%   Blood pressure 120/73, pulse (!) 57, temperature 98 F (36.7 C), resp. rate 16, height 5\' 6"  (1.676 m), weight 64.4 kg, SpO2 95%.   Medical Problem List and Plan: 1. Functional deficits secondary to right PLIC infarction likely secondary to small vessel disease, prior L basal ganglia infarct 2017 (no residual) Left hemiparesis, severe LUE sensory deficit, severe dysarthria             -patient may  shower             -ELOS/Goals: 14-16d   D/c PBD since just got here- at least 2 weeks  Con't CIR PT, OT and SLP  Team conference today to f/u on progress/determine LOS 2.  Antithrombotics: -DVT/anticoagulation:  Pharmaceutical: Lovenox             -antiplatelet therapy: Aspirin 81 mg daily and Plavix 75 mg daily x 3 weeks then Plavix alone 3. Pain Management: Tylenol as needed  3/25- denies pain- con't regimen rpn   3-30: Ongoing left hip pain; Voltaren gel and Tylenol.  3/31- will start Vit D and Ca per pt request- has helped hip pain in past?  4/1- pt insistent was very helpful already for hip pain 4. Mood/Behavior/Sleep: Provide emotional support             -antipsychotic agents: N/A 5. Neuropsych/cognition: This patient is  capable of making decisions on her own behalf. 6. Skin/Wound Care: Routine skin checks 7. Fluids/Electrolytes/Nutrition: Routine and outs with follow-up chemistries 8.  Dysphagia.  Dysphagia #2 thin liquids.  Follow-up speech therapy  3/28- no change in diet so far  3/31- Pt doesn't need provale  cup- will let staff know  4/1- d/w SLP- can drink normally 9.  Hyperlipidemia.  Crestor 10.  Recent History of cocaine/tobacco use.  UDS positive for cocaine.  Provide counseling, this has been a chronic issue , seen in PCP notes 11.  Hypothyroidism.  Synthroid 12.  Constipation.  MiraLAX daily, Senokot-S 1 tab twice daily   3/25- LBM last night after 10 days with no BM  3/26- LBM 2 nights ago- wll monitor- is on Senna 1 tab BID and Miralax- not receiving pain meds, or Baclofen, so less chance of constipation from meds. 3/27- LBM 3 days ago- will give Sorbitol, and soap suds enema- and increase Senna to 2 tabs BID   4/1- LBM 3/30- said attempted ot this AM- on toilet when I saw pt- but appears didn't go? If no results by tomorrow, will intervene more- on Senna 2 tabs BID and Miralax 13. Dysarthria- hard to understand- SLP ordered 14. Polycythemia  3/25- Hb 16.1- and BUN up to 18, so is a little dry- BUN was 9 prior.   3/26- will recheck labs Thursday  3/27- Back to 14.9 after drinking more  3/31- will check labs today 15. Blurry vision  3/27- will need Ophtho after d/c.   3-29: Seems to mostly be from the left eye, with difficulty converging and tracking; messaged OT and PT to trial taping versus alternating eye patch to help with convergence.  3-30: Reporting improvement with use of taping  16.  Left hip pain.  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 C/O feeling dry  4/1- Labs look good- BUN 15 and Cr 0.69 18.  Cough  4/1- didn't hear the cough- sounds clear- if continues, will treat symptomatically.   I spent a total of 39   minutes on total care today- >50% coordination of care- due to  D/w pt about her BM's- her hip pain;  and coughing- however sounds clear  LOS: 8 days A FACE TO FACE EVALUATION WAS PERFORMED  Theresa Watkins 02/17/2024, 9:47 AM

## 2024-02-18 DIAGNOSIS — I63511 Cerebral infarction due to unspecified occlusion or stenosis of right middle cerebral artery: Secondary | ICD-10-CM | POA: Diagnosis not present

## 2024-02-18 NOTE — Plan of Care (Signed)
  Problem: Consults Goal: RH STROKE PATIENT EDUCATION Description: See Patient Education module for education specifics  Outcome: Progressing   Problem: RH BOWEL ELIMINATION Goal: RH STG MANAGE BOWEL WITH ASSISTANCE Description: STG Manage Bowel with toileting Assistance. Outcome: Progressing Goal: RH STG MANAGE BOWEL W/MEDICATION W/ASSISTANCE Description: STG Manage Bowel with Medication with mod I Assistance. Outcome: Progressing   Problem: RH SAFETY Goal: RH STG ADHERE TO SAFETY PRECAUTIONS W/ASSISTANCE/DEVICE Description: STG Adhere to Safety Precautions With cues Assistance/Device. Outcome: Progressing   Problem: RH PAIN MANAGEMENT Goal: RH STG PAIN MANAGED AT OR BELOW PT'S PAIN GOAL Description: Pain managed < 4 with prns Outcome: Progressing   Problem: RH KNOWLEDGE DEFICIT Goal: RH STG INCREASE KNOWLEDGE OF HYPERTENSION Description: Patient and mother will be able to manage HTN using educational resources for medications and dietary modification independently Outcome: Progressing Goal: RH STG INCREASE KNOWLEDGE OF DYSPHAGIA/FLUID INTAKE Description: Patient and mother will be able to manage dysphagia using educational resources for medications and dietary modification independently Outcome: Progressing Goal: RH STG INCREASE KNOWLEGDE OF HYPERLIPIDEMIA Description: Patient and mother will be able to manage HLD using educational resources for medications and dietary modification independently Outcome: Progressing Goal: RH STG INCREASE KNOWLEDGE OF STROKE PROPHYLAXIS Description: Patient and mother will be able to manage secondary risks using educational resources for medications and dietary modification independently Outcome: Progressing   Problem: Consults Goal: RH GENERAL PATIENT EDUCATION Description: See Patient Education module for education specifics. Outcome: Progressing Goal: Skin Care Protocol Initiated - if Braden Score 18 or less Description: If consults are  not indicated, leave blank or document N/A Outcome: Progressing Goal: Nutrition Consult-if indicated Outcome: Progressing Goal: Diabetes Guidelines if Diabetic/Glucose > 140 Description: If diabetic or lab glucose is > 140 mg/dl - Initiate Diabetes/Hyperglycemia Guidelines & Document Interventions  Outcome: Progressing   Problem: RH BOWEL ELIMINATION Goal: RH STG MANAGE BOWEL WITH ASSISTANCE Description: STG Manage Bowel with Assistance. Outcome: Progressing Goal: RH STG MANAGE BOWEL W/MEDICATION W/ASSISTANCE Description: STG Manage Bowel with Medication with Assistance. Outcome: Progressing   Problem: RH BLADDER ELIMINATION Goal: RH STG MANAGE BLADDER WITH ASSISTANCE Description: STG Manage Bladder With Assistance Outcome: Progressing Goal: RH STG MANAGE BLADDER WITH MEDICATION WITH ASSISTANCE Description: STG Manage Bladder With Medication With Assistance. Outcome: Progressing Goal: RH STG MANAGE BLADDER WITH EQUIPMENT WITH ASSISTANCE Description: STG Manage Bladder With Equipment With Assistance Outcome: Progressing   Problem: RH SKIN INTEGRITY Goal: RH STG SKIN FREE OF INFECTION/BREAKDOWN Outcome: Progressing Goal: RH STG MAINTAIN SKIN INTEGRITY WITH ASSISTANCE Description: STG Maintain Skin Integrity With Assistance. Outcome: Progressing Goal: RH STG ABLE TO PERFORM INCISION/WOUND CARE W/ASSISTANCE Description: STG Able To Perform Incision/Wound Care With Assistance. Outcome: Progressing   Problem: RH SAFETY Goal: RH STG ADHERE TO SAFETY PRECAUTIONS W/ASSISTANCE/DEVICE Description: STG Adhere to Safety Precautions With Assistance/Device. Outcome: Progressing Goal: RH STG DECREASED RISK OF FALL WITH ASSISTANCE Description: STG Decreased Risk of Fall With Assistance. Outcome: Progressing   Problem: RH PAIN MANAGEMENT Goal: RH STG PAIN MANAGED AT OR BELOW PT'S PAIN GOAL Outcome: Progressing   Problem: RH KNOWLEDGE DEFICIT GENERAL Goal: RH STG INCREASE KNOWLEDGE OF  SELF CARE AFTER HOSPITALIZATION Outcome: Progressing

## 2024-02-18 NOTE — Progress Notes (Signed)
 Speech Language Pathology Daily Session Note  Patient Details  Name: Theresa Watkins MRN: [id number] Date of Birth: Dec 19, 1965  Today's Date: 02/18/2024 SLP Individual Time: 4098-1191 SLP Individual Time Calculation (min): 57 min  Short Term Goals: Week 2: SLP Short Term Goal 1 (Week 2): Patient will consume least restrictive diet with use of compensatory strategies given supervision multimodal A SLP Short Term Goal 2 (Week 2): Patient will increase speech intelligibility to 80% at the sentence level given min multimodal A  Skilled Therapeutic Interventions: SLP conducted skilled therapy session targeting dysphagia management and communication goals. Assessed tolerance of upgraded trials of Dys3 solids and thin liquids. Patient required increased mastication time but otherwise tolerated consistency without any overt difficulty. Plan for trial tray 4/4 AM to assess tolerance of upgraded textures across full meal. Re: communication, patient recalled 3/4 speech intelligibility strategies independently. SLP facilitated EMST @ 35cmH2O (5 sets of 5) and IOPI in anterior position at 20 kPa and posterior position at 15 kPa. In remaining minutes of session, patient utilized speech intelligibility strategies at the sentence level with 85% intelligibility given min cues. Patient was left in lowered bed with call bell in reach and bed alarm set. SLP will continue to target goals per plan of care.       Pain Pain Assessment Pain Scale: 0-10  Therapy/Group: Individual Therapy  Jeannie Done, M.A., CCC-SLP  Yetta Barre 02/18/2024, 3:55 PM

## 2024-02-18 NOTE — Progress Notes (Signed)
 Occupational Therapy Session Note  Patient Details  Name: Theresa Watkins MRN: [id number] Date of Birth: Jul 11, 1966  Today's Date: 02/18/2024 OT Individual Time: 1300-1345 OT Individual Time Calculation (min): 45 min    Short Term Goals: Week 1:  OT Short Term Goal 1 (Week 1): Pt will complete PROM with LUE without VC OT Short Term Goal 1 - Progress (Week 1): Progressing toward goal OT Short Term Goal 2 (Week 1): Pt will use LUE as stabilizer during ADL tasks with Mod A OT Short Term Goal 2 - Progress (Week 1): Met OT Short Term Goal 3 (Week 1): Pt will complete shower transfer at Mod A with LRAD OT Short Term Goal 3 - Progress (Week 1): Progressing toward goal OT Short Term Goal 4 (Week 1): Pt will compplete UB dressing with himi technique at Mod A OT Short Term Goal 4 - Progress (Week 1): Met Week 2:  OT Short Term Goal 1 (Week 2): Pt will complete PROM with LUE without VC OT Short Term Goal 2 (Week 2): Pt will complete shower transfer at Mod A with LRAD OT Short Term Goal 3 (Week 2): Pt will complete toilet transfers with mod A OT Short Term Goal 4 (Week 2): Pt will complete toileting tasks with mod A OT Short Term Goal 5 (Week 2): Pt will complete LB dressing tasks with mod A  Skilled Therapeutic Interventions/Progress Updates:      Therapy Documentation Precautions:  Precautions Precautions: Fall Precaution/Restrictions Comments: L hemi Restrictions Weight Bearing Restrictions Per Provider Order: No General: "I missed you girl!" Pt supine in bed upon OT arrival, agreeable to OT session. Pt session outside in order to improve mood  Pain: no pain reported  ADL: OT providing skilled intervention for bed mobility and transfers. Pt completed supine><EOB with SBA and increased time with VC for safety. Pt then completed squat pivot from EOB><W/C with CGA initially, Min A at end of session d/t fatigue and VC for safety awareness.   Exercises:Pt completed the following exercise  circuit in order to improve functional activity, strength and endurance to prepare for ADLs such as bathing. Pt completed the following exercises in seated/standing position with no noted LOB/SOB and 1x10 repetitions on each exercise: -sit to stands, using rail to pull into standing  -shoulder flexion using RUE to AAROM LUE  -forward punches, AAROM with LUE   Pt supine in bed with bed alarm activated, 2 bed rails up, call light within reach and 4Ps assessed.   Therapy/Group: Individual Therapy  Velia Meyer, OTD, OTR/L 02/18/2024, 3:47 PM

## 2024-02-18 NOTE — Progress Notes (Signed)
 PROGRESS NOTE   Subjective/Complaints:  Pt reports LBM yesterday around noon- was large per pt-  Pt also feels like "she's catching something"- because cold (the room is 69 degrees) and back sore-  Asking to upgrade diet because food not great.    ROS: Limited by severe dysarthria/cognition Objective:   No results found.  Recent Labs    02/17/24 0525  WBC 4.5  HGB 14.1  HCT 42.3  PLT 183    Recent Labs    02/17/24 0525  NA 140  K 3.9  CL 108  CO2 24  GLUCOSE 94  BUN 15  CREATININE 0.69  CALCIUM 8.7*     Intake/Output Summary (Last 24 hours) at 02/18/2024 0820 Last data filed at 02/17/2024 1800 Gross per 24 hour  Intake 597 ml  Output --  Net 597 ml        Physical Exam: Vital Signs Blood pressure 109/68, pulse (!) 58, temperature 98.2 F (36.8 C), resp. rate 17, height 5\' 6"  (1.676 m), weight 64.4 kg, SpO2 95%.       General: awake, alert, appropriate, hard to understand-  NAD HENT: conjugate gaze; oropharynx moist CV: regular rhythm, borderline bradycardic rate; no JVD Pulmonary: CTA B/L; no W/R/R- good air movement GI: soft, NT, ND, (+)BS Psychiatric: appropriate but frustrated trying to make self understood Neurological: severe dysarthria- hard to concentrate because trying to get comfortable on bed;   Neurologic: Cranial nerves II through XII intact, motor strength is 5/5 in right , 2-/5 left  deltoid, bicep, tricep, grip, 5/5 Right, 3/5 left hip flexor, knee extensors, 5/5 RIght , 2- left ankle dorsiflexor and plantar flexor  Sensory exam normal sensation to light touch in right upper and lower extremities, feels pinch LLE , no sensation to pinch in left fingers Cerebellar exam normal finger to nose to finger RIght , unable to perform on left due to weakness Musculoskeletal:  + Pain and limited ROM in L hip internal >> extenal rotation; no deformity or crepitus.  FABER, FADIR with groin  pain.   Unchanged 3-30  Assessment/Plan: 1. Functional deficits which require 3+ hours per day of interdisciplinary therapy in a comprehensive inpatient rehab setting. Physiatrist is providing close team supervision and 24 hour management of active medical problems listed below. Physiatrist and rehab team continue to assess barriers to discharge/monitor patient progress toward functional and medical goals  Care Tool:  Bathing    Body parts bathed by patient: Face, Left arm, Chest, Abdomen, Front perineal area, Right upper leg, Left upper leg, Right lower leg   Body parts bathed by helper: Right arm, Buttocks, Left lower leg     Bathing assist Assist Level: Moderate Assistance - Patient 50 - 74%     Upper Body Dressing/Undressing Upper body dressing   What is the patient wearing?: Pull over shirt    Upper body assist Assist Level: Moderate Assistance - Patient 50 - 74%    Lower Body Dressing/Undressing Lower body dressing      What is the patient wearing?: Underwear/pull up, Pants     Lower body assist Assist for lower body dressing: Maximal Assistance - Patient 25 - 49%  Toileting Toileting    Toileting assist Assist for toileting: Total Assistance - Patient < 25%     Transfers Chair/bed transfer  Transfers assist     Chair/bed transfer assist level: Moderate Assistance - Patient 50 - 74%     Locomotion Ambulation   Ambulation assist      Assist level: 2 helpers (max a +w/c follow) Assistive device: Other (comment) (R hand rail) Max distance: 10   Walk 10 feet activity   Assist     Assist level: 2 helpers (max a +w/c follow) Assistive device: Other (comment) (R hand rail)   Walk 50 feet activity   Assist Walk 50 feet with 2 turns activity did not occur: Safety/medical concerns (L hemiplegia and pt fatigue)         Walk 150 feet activity   Assist Walk 150 feet activity did not occur: Safety/medical concerns (L hemiplegia and pt  fatigue)         Walk 10 feet on uneven surface  activity   Assist Walk 10 feet on uneven surfaces activity did not occur: Safety/medical concerns (L hemiplegia and pt fatigue)         Wheelchair     Assist Is the patient using a wheelchair?: Yes Type of Wheelchair: Manual    Wheelchair assist level: Supervision/Verbal cueing, Minimal Assistance - Patient > 75% Max wheelchair distance: 150    Wheelchair 50 feet with 2 turns activity    Assist        Assist Level: Supervision/Verbal cueing   Wheelchair 150 feet activity     Assist      Assist Level: Minimal Assistance - Patient > 75%   Blood pressure 109/68, pulse (!) 58, temperature 98.2 F (36.8 C), resp. rate 17, height 5\' 6"  (1.676 m), weight 64.4 kg, SpO2 95%.   Medical Problem List and Plan: 1. 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  2.  Antithrombotics: -DVT/anticoagulation:  Pharmaceutical: Lovenox             -antiplatelet therapy: Aspirin 81 mg daily and Plavix 75 mg daily x 3 weeks then Plavix alone 3. Pain Management: Tylenol as needed  3/25- denies pain- con't regimen rpn   3-30: Ongoing left hip pain; Voltaren gel and Tylenol.  3/31- will start Vit D and Ca per pt request- has helped hip pain in past?  4/1- pt insistent was very helpful already for hip pain  4/2- pt wants at the same time- per chart, Ca and Vit D come at same time 4. Mood/Behavior/Sleep: Provide emotional support             -antipsychotic agents: N/A 5. Neuropsych/cognition: This patient is capable of making decisions on her own  behalf. 6. Skin/Wound Care: Routine skin checks 7. Fluids/Electrolytes/Nutrition: Routine and outs with follow-up chemistries 8.  Dysphagia.  Dysphagia #2 thin liquids.  Follow-up speech therapy  3/28- no change in diet so far  3/31- Pt doesn't need provale cup- will let staff know  4/1- d/w SLP-  can drink normally  4.2- pt wants ot upgrade diet- explained she's not ready and could get pneumonia if update too early 9.  Hyperlipidemia.  Crestor 10.  Recent History of cocaine/tobacco use.  UDS positive for cocaine.  Provide counseling, this has been a chronic issue , seen in PCP notes 11.  Hypothyroidism.  Synthroid 12.  Constipation.  MiraLAX daily, Senokot-S 1 tab twice daily   3/25- LBM last night after 10 days with no BM  3/26- LBM 2 nights ago- wll monitor- is on Senna 1 tab BID and Miralax- not receiving pain meds, or Baclofen, so less chance of constipation from meds. 3/27- LBM 3 days ago- will give Sorbitol, and soap suds enema- and increase Senna to 2 tabs BID   4/1- LBM 3/30- said attempted ot this AM- on toilet when I saw pt- but appears didn't go? If no results by tomorrow, will intervene more- on Senna 2 tabs BID and Miralax  4/2- LBM yesterday- per pt was large 13. Dysarthria- hard to understand- SLP ordered 14. Polycythemia  3/25- Hb 16.1- and BUN up to 18, so is a little dry- BUN was 9 prior.   3/26- will recheck labs Thursday  3/27- Back to 14.9 after drinking more  3/31- will check labs today 15. Blurry vision  3/27- will need Ophtho after d/c.   3-29: Seems to mostly be from the left eye, with difficulty converging and tracking; messaged OT and PT to trial taping versus alternating eye patch to help with convergence.  3-30: Reporting improvement with use of taping  16.  Left hip pain.  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 C/O feeling dry  4/1- Labs look good- BUN 15 and Cr 0.69 18. Cough  4/1- didn't hear the cough- sounds clear- if continues, will treat symptomatically.   4/2- pt feels like "coming down with something"- because her back is sore and was "freezing" last night- is 69 degrees in room   I spent a total of  36  minutes on total care today- >50% coordination of care- due to  Pt discussion about diet and prolonged time in room due to trying to understand her- also d/w NT about food and her BM yesterday  LOS: 9 days A FACE TO FACE EVALUATION WAS PERFORMED  Theresa Watkins 02/18/2024, 8:20 AM

## 2024-02-18 NOTE — Progress Notes (Signed)
 Physical Therapy Session Note  Patient Details  Name: Theresa Watkins MRN: [id number] Date of Birth: [birth year]  Today's Date: 02/18/2024 PT Individual Time: 1009-1107 PT Individual Time Calculation (min): 58 min   Short Term Goals: Week 2:  PT Short Term Goal 1 (Week 2): pt will ambulate 100 ft with assistance PT Short Term Goal 2 (Week 2): Pt will perform bed/chair transfer with CGA consistently PT Short Term Goal 3 (Week 2): Pt will initiate stair training  Skilled Therapeutic Interventions/Progress Updates: Pt presented in bed agreeable to therapy. Pt c/o unrated pain in LLE, per pt arthritis and stated MD put her on new meds. Pt completed bed mobility with supervision and use of bed features. PTA donned shoes total A for time management. Pt set up with w/c with completed squat pivot transfer with pt pulling self into w/c with CGA. PTA set up leg rest and pt propelled to nsg station ~166ft with primarily RLE intermittently using RUE. Pt transported remaining distance to main gym and attempted standing at table. Pt was able to stand with minA however was unable to tolerate standing for more than 30 sec due to pain. Pt also expressed that she is fearful of standing due to pain therefore additional standing activities for rest of session. Pt moved over to mat and completed squat pivot transfer to R with CGA. Pt completed sit to supine with minA forLLE management and initiated mat exercises for LLE NMR and therex. Pt completed SAQ 2 x 10 on bolster with emphasis on end range and to perform with slower movement for coordination. Pt also worked on bridging with bolster at ankles x 10. Pt attempting many compensatory movements to not use LLE during this activity and pt later indicated that it felt that " the mat was moving up and down" upon discussion pt indicated that she had been holding her breath therefore PTA educated pt on breathing through movement and to avoid valsalva, pt verbalized  understanding. Pt completed supine to sit on flat mat with supervision and with encouragement performed squat pivot transfer to L with CGA. Pt transported back to room and completed squat pivot transfer with pt pulling on bed rail with CGA however noted decreased safety awareness with pt landing partially off bed. PTA then provided minA for complete posteriorlateral scoot to reposition safely in bed. PTA providing education that may demonstrate increased impulsivity when fatigued as noted prior. Pt compelted sit to supine with minA for LLE management and pt left in bed at end of session with bed alarm on, call bell within reach and needs met.      Therapy Documentation Precautions:  Precautions Precautions: Fall Precaution/Restrictions Comments: L hemi Restrictions Weight Bearing Restrictions Per Provider Order: No General:   Vital Signs: Therapy Vitals Temp: 98.9 F (37.2 C) Temp Source: Oral Pulse Rate: (!) 53 Resp: 18 BP: 109/67 Oxygen Therapy SpO2: 99 % Pain: Pain Assessment Pain Scale: 0-10   Therapy/Group: Individual Therapy  Mikaelah Trostle 02/18/2024, 4:41 PM

## 2024-02-18 NOTE — Progress Notes (Signed)
 Speech Language Pathology Daily Session Note  Patient Details  Name: Theresa Watkins MRN: [id number] Date of Birth: 1966-09-24  Today's Date: 02/18/2024 SLP Individual Time: 4098-1191 SLP Individual Time Calculation (min): 43 min  Short Term Goals: Week 2: SLP Short Term Goal 1 (Week 2): Patient will consume least restrictive diet with use of compensatory strategies given supervision multimodal A SLP Short Term Goal 2 (Week 2): Patient will increase speech intelligibility to 80% at the sentence level given min multimodal A  Skilled Therapeutic Interventions:  Patient was seen in am to address speech intelligibility and dysphagia management. Pt was alert and seated upright in bed with nurse and NT present. She was agreeeable for session. Pt reporting disdain for current diet and stating, "I'm tired of eating gerbal food." Education completed on rationale for current diet including weakness of lingual muscles, subsequent moderate lingual reisude, poor awareness of residue, and risk of aspiration. Pt verbalized understanding and able to be redirected. SLP encouraged oral hygiene. Pt presented with suction toothbrush and demo independence with thorough oral hygiene. SLP assisted in cleaning of dentures. Pt presented with EMST where she completed 5 sets of 5 at 35cm H20. Unable to complete IOPI for lingual strengthening due to it being unavailable. In subsequent minutes of session, SLP addressed speech intelligibility. Pt able to recall 3 of 4 SLOP strategies. SLP reviewed remaining strategy along with recommendations for short simple phrases. SLP engaged pt in a structured task where she was challenged to communicate an unknown message to listener with use of speech intelligibility strategies. Pt maintained ~85% intelligibility at the phrase level with min A. At conclusion of session, pt was left upright in bed with call button within reach and bed alarm active. SLP to continue POC.   Pain Pain  Assessment Pain Scale: 0-10 Pain Score: 10-Worst pain ever Pain Location: Leg  Therapy/Group: Individual Therapy  [name] 02/18/2024, 10:24 AM

## 2024-02-19 ENCOUNTER — Inpatient Hospital Stay (HOSPITAL_COMMUNITY)

## 2024-02-19 DIAGNOSIS — R9082 White matter disease, unspecified: Secondary | ICD-10-CM | POA: Diagnosis not present

## 2024-02-19 DIAGNOSIS — R519 Headache, unspecified: Secondary | ICD-10-CM | POA: Diagnosis not present

## 2024-02-19 DIAGNOSIS — I63511 Cerebral infarction due to unspecified occlusion or stenosis of right middle cerebral artery: Secondary | ICD-10-CM | POA: Diagnosis not present

## 2024-02-19 MED ORDER — DULOXETINE HCL 30 MG PO CPEP
30.0000 mg | ORAL_CAPSULE | Freq: Every day | ORAL | Status: DC
  Administered 2024-02-19 – 2024-02-24 (×6): 30 mg via ORAL
  Filled 2024-02-19 (×6): qty 1

## 2024-02-19 MED ORDER — BUTALBITAL-APAP-CAFFEINE 50-325-40 MG PO TABS
1.0000 | ORAL_TABLET | ORAL | Status: DC | PRN
  Administered 2024-02-19 – 2024-02-28 (×10): 1 via ORAL
  Filled 2024-02-19 (×10): qty 1

## 2024-02-19 NOTE — Progress Notes (Signed)
 Physical Therapy Session Note  Patient Details  Name: Theresa Watkins MRN: [id number] Date of Birth: 1966-04-09  Today's Date: 02/19/2024 PT Missed Time: 45 Minutes Missed Time Reason: Patient ill (Comment) (migraine)  Short Term Goals: Week 2:  PT Short Term Goal 1 (Week 2): pt will ambulate 100 ft with assistance PT Short Term Goal 2 (Week 2): Pt will perform bed/chair transfer with CGA consistently PT Short Term Goal 3 (Week 2): Pt will initiate stair training  Skilled Therapeutic Interventions/Progress Updates:    Pt reports having migraine and while feeling some better than the morning, she is too "doped up" to participate at this time. Pt missed x 45 min of skilled therapy, will make up as schedule allows.   Therapy Documentation Precautions:  Precautions Precautions: Fall Precaution/Restrictions Comments: L hemi Restrictions Weight Bearing Restrictions Per Provider Order: No General: PT Amount of Missed Time (min): 45 Minutes PT Missed Treatment Reason: Patient ill (Comment) (migraine)     Therapy/Group: Individual Therapy  Juluis Rainier 02/19/2024, 1:29 PM

## 2024-02-19 NOTE — Progress Notes (Signed)
 Occupational Therapy Note  Patient Details  Name: Theresa Watkins MRN: [id number] Date of Birth: 1966/05/02  Today's Date: 02/19/2024 OT Missed Time: 75 Minutes Missed Time Reason: Patient ill (comment) (migraine)  Pt c/o "severe" migraine and unable to participate. MD aware. Will attempt to see pt again as schedule allows.   Lavone Neri [facility] 02/19/2024, 10:45 AM

## 2024-02-19 NOTE — Group Note (Addendum)
 Patient Details Name: Theresa Watkins MRN: [id number] DOB: Dec 08, 1965 Today's Date: 02/19/2024    Goal:  Pt will actively participate in therapeutic dance group for 60 minutes with supervision.  NOT MET  PT attempted dance group participation, but felt "woozy" when dancing and requested to return to the room.  Pain:no c/o Pain Assessment Pain Scale: 0-10 Pain Score: 9  Pain Location: Head    Edythe Riches 02/19/2024, 4:11 PM

## 2024-02-19 NOTE — Progress Notes (Signed)
 Patient ID: Theresa Watkins, female   DOB: 1966/09/19, 58 y.o.   MRN: [id number]   SW faxed PCS evaluation referral to insurance [employer] Care Management Dept to fax #[phone].  Cecile Sheerer, MSW, LCSW Office: [phone] Cell: [phone] Fax: [phone]

## 2024-02-19 NOTE — Progress Notes (Signed)
 PROGRESS NOTE   Subjective/Complaints:  Pt reports super bad HA this AM- is light sensitive and sound sensitive.  Couldn't do OT and hasn't missed since she's been here, per pT.  Pt does reports very slightly better, but having more difficulties speaking this AM- hard to get words out.  Also having pain in L 2nd MCP- TTP.  Also describes burning pain in LUE- hich is new.    ROS:   Limited by severe dysarthria/cognition Objective:   CT HEAD WO CONTRAST ( ) Result Date: 02/19/2024 CLINICAL DATA:  58 year old female with severe headache, neurologic deficit, right thalamic lacunar infarct in March. EXAM: CT HEAD WITHOUT CONTRAST TECHNIQUE: Contiguous axial images were obtained from the base of the skull through the vertex without intravenous contrast. RADIATION DOSE REDUCTION: This exam was performed according to the departmental dose-optimization program which includes automated exposure control, adjustment of the mA and/or kV according to patient size and/or use of iterative reconstruction technique. COMPARISON:  Head CT 02/07/2024 and earlier. FINDINGS: Brain: Chronic lacunar infarcts in the bilateral deep gray matter nuclei, deep white matter. The more recent right lateral the lamina region lacunar infarct hypodensity has mildly faded since 02/07/2024. Other regional gray-white differentiation is stable. No midline shift, ventriculomegaly, mass effect, evidence of mass lesion, intracranial hemorrhage or evidence of cortically based acute infarction. Vascular: No suspicious intracranial vascular hyperdensity. Skull: Intact, stable. Sinuses/Orbits: Visualized paranasal sinuses and mastoids are stable and well aerated. Other: Visualized orbits and scalp soft tissues are within normal limits. IMPRESSION: 1. Chronically advanced deep gray matter and deep white matter small vessel disease with expected evolution of right thalamic lacune since  last month. 2. No acute intracranial abnormality. Electronically Signed   By: Odessa Fleming M.D.   On: 02/19/2024 08:53    Recent Labs    02/17/24 0525  WBC 4.5  HGB 14.1  HCT 42.3  PLT 183    Recent Labs    02/17/24 0525  NA 140  K 3.9  CL 108  CO2 24  GLUCOSE 94  BUN 15  CREATININE 0.69  CALCIUM 8.7*     Intake/Output Summary (Last 24 hours) at 02/19/2024 0932 Last data filed at 02/19/2024 0800 Gross per 24 hour  Intake 710 ml  Output --  Net 710 ml        Physical Exam: Vital Signs Blood pressure 115/78, pulse (!) 58, temperature 98 F (36.7 C), temperature source Oral, resp. rate 18, height 5\' 6"  (1.676 m), weight 64.4 kg, SpO2 95%.        General: hiding under pillow; supine in bed;  more saliva in her mouth; NAD HENT: conjugate gaze; oropharynx moist CV: regular rhythm, mildly bradycardic rate; no JVD Pulmonary: CTA B/L; no W/R/R- good air movement GI: soft, NT, ND, (+)BS Psychiatric: appropriate- frustrated about HA and speech Neurological: moderately harder to understand this AM- couldn't get the words out-   Strength exam- LUE ~3/5- is better  LLE 3+ to 4-/5   Hiding under the pillow due to HA  Neurologic: Cranial nerves II through XII intact, motor strength is 5/5 in right , 2-/5 left  deltoid, bicep, tricep, grip, 5/5 Right, 3/5 left hip flexor,  knee extensors, 5/5 RIght , 2- left ankle dorsiflexor and plantar flexor  Sensory exam normal sensation to light touch in right upper and lower extremities, feels pinch LLE , no sensation to pinch in left fingers Cerebellar exam normal finger to nose to finger RIght , unable to perform on left due to weakness Musculoskeletal:  + Pain and limited ROM in L hip internal >> extenal rotation; no deformity or crepitus.  FABER, FADIR with groin pain.   Unchanged 3-30  Assessment/Plan: 1. Functional deficits which require 3+ hours per day of interdisciplinary therapy in a comprehensive inpatient rehab  setting. Physiatrist is providing close team supervision and 24 hour management of active medical problems listed below. Physiatrist and rehab team continue to assess barriers to discharge/monitor patient progress toward functional and medical goals  Care Tool:  Bathing    Body parts bathed by patient: Face, Left arm, Chest, Abdomen, Front perineal area, Right upper leg, Left upper leg, Right lower leg   Body parts bathed by helper: Right arm, Buttocks, Left lower leg     Bathing assist Assist Level: Moderate Assistance - Patient 50 - 74%     Upper Body Dressing/Undressing Upper body dressing   What is the patient wearing?: Pull over shirt    Upper body assist Assist Level: Moderate Assistance - Patient 50 - 74%    Lower Body Dressing/Undressing Lower body dressing      What is the patient wearing?: Underwear/pull up, Pants     Lower body assist Assist for lower body dressing: Maximal Assistance - Patient 25 - 49%     Toileting Toileting    Toileting assist Assist for toileting: Total Assistance - Patient < 25%     Transfers Chair/bed transfer  Transfers assist     Chair/bed transfer assist level: Moderate Assistance - Patient 50 - 74%     Locomotion Ambulation   Ambulation assist      Assist level: 2 helpers (max a +w/c follow) Assistive device: Other (comment) (R hand rail) Max distance: 10   Walk 10 feet activity   Assist     Assist level: 2 helpers (max a +w/c follow) Assistive device: Other (comment) (R hand rail)   Walk 50 feet activity   Assist Walk 50 feet with 2 turns activity did not occur: Safety/medical concerns (L hemiplegia and pt fatigue)         Walk 150 feet activity   Assist Walk 150 feet activity did not occur: Safety/medical concerns (L hemiplegia and pt fatigue)         Walk 10 feet on uneven surface  activity   Assist Walk 10 feet on uneven surfaces activity did not occur: Safety/medical concerns (L  hemiplegia and pt fatigue)         Wheelchair     Assist Is the patient using a wheelchair?: Yes Type of Wheelchair: Manual    Wheelchair assist level: Supervision/Verbal cueing, Minimal Assistance - Patient > 75% Max wheelchair distance: 150    Wheelchair 50 feet with 2 turns activity    Assist        Assist Level: Supervision/Verbal cueing   Wheelchair 150 feet activity     Assist      Assist Level: Minimal Assistance - Patient > 75%   Blood pressure 115/78, pulse (!) 58, temperature 98 F (36.7 C), temperature source Oral, resp. rate 18, height 5\' 6"  (1.676 m), weight 64.4 kg, SpO2 95%.   Medical Problem List and Plan: 1. Functional deficits  secondary to right PLIC infarction likely secondary to small vessel disease, prior L basal ganglia infarct 2017 (no residual) Left hemiparesis, severe LUE sensory deficit, severe dysarthria             -patient may  shower             -ELOS/Goals: 14-16d   D/c PBD since just got here- at least 2 weeks  D/c 4/22  Pt refused OT this Am due to severe HA- was hiding underneath pillow due to light  Con't CIR, PT, and SLP    2.  Antithrombotics: -DVT/anticoagulation:  Pharmaceutical: Lovenox             -antiplatelet therapy: Aspirin 81 mg daily and Plavix 75 mg daily x 3 weeks then Plavix alone 3. Pain Management: Tylenol as needed  3/25- denies pain- con't regimen rpn   3-30: Ongoing left hip pain; Voltaren gel and Tylenol.  3/31- will start Vit D and Ca per pt request- has helped hip pain in past?  4/1- pt insistent was very helpful already for hip pain  4/2- pt wants at the same time- per chart, Ca and Vit D come at same time  4/3- having new burning pain in LUE- which is seen sometimes with stroke- will add Duloxetine 30 mg daily for nerve pain- monitor for side effects.  -if gets nausea, will change to Lyrica.  4. Mood/Behavior/Sleep: Provide emotional support             -antipsychotic agents: N/A 5.  Neuropsych/cognition: This patient is capable of making decisions on her own behalf. 6. Skin/Wound Care: Routine skin checks 7. Fluids/Electrolytes/Nutrition: Routine and outs with follow-up chemistries 8.  Dysphagia.  Dysphagia #2 thin liquids.  Follow-up speech therapy  3/28- no change in diet so far  3/31- Pt doesn't need provale cup- will let staff know  4/1- d/w SLP- can drink normally  4.2- pt wants ot upgrade diet- explained she's not ready and could get pneumonia if update too early 9.  Hyperlipidemia.  Crestor 10.  Recent History of cocaine/tobacco use.  UDS positive for cocaine.  Provide counseling, this has been a chronic issue , seen in PCP notes 11.  Hypothyroidism.  Synthroid 12.  Constipation.  MiraLAX daily, Senokot-S 1 tab twice daily   3/25- LBM last night after 10 days with no BM  3/26- LBM 2 nights ago- wll monitor- is on Senna 1 tab BID and Miralax- not receiving pain meds, or Baclofen, so less chance of constipation from meds. 3/27- LBM 3 days ago- will give Sorbitol, and soap suds enema- and increase Senna to 2 tabs BID   4/1- LBM 3/30- said attempted ot this AM- on toilet when I saw pt- but appears didn't go? If no results by tomorrow, will intervene more- on Senna 2 tabs BID and Miralax  4/3- LBM yesterday 13. Dysarthria- hard to understand- SLP ordered  4/3- Was worse this AM- will monitor for progress 14. Polycythemia  3/25- Hb 16.1- and BUN up to 18, so is a little dry- BUN was 9 prior.   3/26- will recheck labs Thursday  3/27- Back to 14.9 after drinking more  3/31- will check labs today 15. Blurry vision  3/27- will need Ophtho after d/c.   3-29: Seems to mostly be from the left eye, with difficulty converging and tracking; messaged OT and PT to trial taping versus alternating eye patch to help with convergence.  3-30: Reporting improvement with use of taping  16.  Left hip pain.  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 C/O feeling dry  4/1- Labs look good- BUN 15 and Cr 0.69 18. Cough  4/1- didn't hear the cough- sounds clear- if continues, will treat symptomatically.   4/2- pt feels like "coming down with something"- because her back is sore and was "freezing" last night- is 69 degrees in room 19. Severe HA   4/3- pt had severe HA this AM as well as increased dysarthria/difficulty speaking- no additional Neuro Sx's, but hadn't been having HA's-ordered STAT CT because my concern for possible new CVA/extension of stroke- Head CT (-)- Sx's are improving and given Fioricet as needed for HA- if continues today, will get MRI, but head CT shows no new CVA.     I spent a total of 54   minutes on total care today- >50% coordination of care- due to  D/w PA and nursing x2 about Head CT as well as my concerns about possible CVA extension- also d/w pt about burning pain and migraine/HA- changed meds around for both issues- also went over head CT independently.    LOS: 10 days A FACE TO FACE EVALUATION WAS PERFORMED  Hadassa Cermak 02/19/2024, 9:32 AM

## 2024-02-19 NOTE — Plan of Care (Signed)
  Problem: Consults Goal: RH STROKE PATIENT EDUCATION Description: See Patient Education module for education specifics  Outcome: Progressing   Problem: RH BOWEL ELIMINATION Goal: RH STG MANAGE BOWEL WITH ASSISTANCE Description: STG Manage Bowel with toileting Assistance. Outcome: Progressing Goal: RH STG MANAGE BOWEL W/MEDICATION W/ASSISTANCE Description: STG Manage Bowel with Medication with mod I Assistance. Outcome: Progressing   Problem: RH SAFETY Goal: RH STG ADHERE TO SAFETY PRECAUTIONS W/ASSISTANCE/DEVICE Description: STG Adhere to Safety Precautions With cues Assistance/Device. Outcome: Progressing   Problem: RH PAIN MANAGEMENT Goal: RH STG PAIN MANAGED AT OR BELOW PT'S PAIN GOAL Description: Pain managed < 4 with prns Outcome: Progressing   Problem: RH KNOWLEDGE DEFICIT Goal: RH STG INCREASE KNOWLEDGE OF HYPERTENSION Description: Patient and mother will be able to manage HTN using educational resources for medications and dietary modification independently Outcome: Progressing Goal: RH STG INCREASE KNOWLEDGE OF DYSPHAGIA/FLUID INTAKE Description: Patient and mother will be able to manage dysphagia using educational resources for medications and dietary modification independently Outcome: Progressing Goal: RH STG INCREASE KNOWLEGDE OF HYPERLIPIDEMIA Description: Patient and mother will be able to manage HLD using educational resources for medications and dietary modification independently Outcome: Progressing Goal: RH STG INCREASE KNOWLEDGE OF STROKE PROPHYLAXIS Description: Patient and mother will be able to manage secondary risks using educational resources for medications and dietary modification independently Outcome: Progressing   Problem: Consults Goal: RH GENERAL PATIENT EDUCATION Description: See Patient Education module for education specifics. Outcome: Progressing Goal: Skin Care Protocol Initiated - if Braden Score 18 or less Description: If consults are  not indicated, leave blank or document N/A Outcome: Progressing Goal: Nutrition Consult-if indicated Outcome: Progressing Goal: Diabetes Guidelines if Diabetic/Glucose > 140 Description: If diabetic or lab glucose is > 140 mg/dl - Initiate Diabetes/Hyperglycemia Guidelines & Document Interventions  Outcome: Progressing   Problem: RH BOWEL ELIMINATION Goal: RH STG MANAGE BOWEL WITH ASSISTANCE Description: STG Manage Bowel with Assistance. Outcome: Progressing Goal: RH STG MANAGE BOWEL W/MEDICATION W/ASSISTANCE Description: STG Manage Bowel with Medication with Assistance. Outcome: Progressing   Problem: RH BLADDER ELIMINATION Goal: RH STG MANAGE BLADDER WITH ASSISTANCE Description: STG Manage Bladder With Assistance Outcome: Progressing Goal: RH STG MANAGE BLADDER WITH MEDICATION WITH ASSISTANCE Description: STG Manage Bladder With Medication With Assistance. Outcome: Progressing Goal: RH STG MANAGE BLADDER WITH EQUIPMENT WITH ASSISTANCE Description: STG Manage Bladder With Equipment With Assistance Outcome: Progressing   Problem: RH SKIN INTEGRITY Goal: RH STG SKIN FREE OF INFECTION/BREAKDOWN Outcome: Progressing Goal: RH STG MAINTAIN SKIN INTEGRITY WITH ASSISTANCE Description: STG Maintain Skin Integrity With Assistance. Outcome: Progressing Goal: RH STG ABLE TO PERFORM INCISION/WOUND CARE W/ASSISTANCE Description: STG Able To Perform Incision/Wound Care With Assistance. Outcome: Progressing   Problem: RH SAFETY Goal: RH STG ADHERE TO SAFETY PRECAUTIONS W/ASSISTANCE/DEVICE Description: STG Adhere to Safety Precautions With Assistance/Device. Outcome: Progressing Goal: RH STG DECREASED RISK OF FALL WITH ASSISTANCE Description: STG Decreased Risk of Fall With Assistance. Outcome: Progressing   Problem: RH PAIN MANAGEMENT Goal: RH STG PAIN MANAGED AT OR BELOW PT'S PAIN GOAL Outcome: Progressing   Problem: RH KNOWLEDGE DEFICIT GENERAL Goal: RH STG INCREASE KNOWLEDGE OF  SELF CARE AFTER HOSPITALIZATION Outcome: Progressing

## 2024-02-19 NOTE — Evaluation (Signed)
 Recreational Therapy Assessment and Plan  Patient Details  Name: Theresa Watkins MRN: [id number] Date of Birth: Feb 28, 1966 Today's Date: 02/19/2024  Rehab Potential:  Good ELOS:   d/c  4/22  Assessment  Hospital Problem: Principal Problem:   Right middle cerebral artery stroke [facility])     Past Medical History:      Past Medical History:  Diagnosis Date   Abnormal Pap smear     Bronchitis     Fibroid     Headache(784.0)     Ovarian cyst     Plantar fasciitis     Seizures (HCC)     Stroke [facility])     Thyroid cancer (HCC) 20011   Trichomonas     Urinary tract infection          Past Surgical History:       Past Surgical History:  Procedure Laterality Date   ABDOMINAL HYSTERECTOMY       CESAREAN SECTION       goiter        removed   laparoscopic   1988    removal  of ectopic preg, ruptured tube   THERAPEUTIC ABORTION       THYROIDECTOMY   march 2011    cancer          Assessment & Plan Clinical Impression:Theresa Watkins is a 58 year old right-handed female with history of hypertension, hyperlipidemia, hypothyroidism, cocaine/polysubstance abuse as well as tobacco use. Per chart review patient lives alone. Reportedly used a straight point cane prior to admission. She does have family in the area. Presented 02/02/2024 with aphasia as well as left-sided weakness after being found down by her son. CT/MRI showed several small acute to subacute lacunar infarcts in the lateral right thalamus, posterior limb right external capsule. No associated hemorrhage or mass effect. Underlying extensive chronic small vessel disease. 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 MRA negative for large vessel occlusion. New since 2023 severe left PCA P2 segment stenosis. 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 Patient did not receive TNK. Admission chemistries unremarkable  except potassium 3.4, urine drug screen positive cocaine, hemoglobin A1c 5.8. Echocardiogram ejection fraction of 60 to 65% no wall motion abnormalities. Presently maintained on aspirin 81 mg daily and Plavix 75 mg daily for CVA prophylaxis x 3 weeks then Plavix alone. Lovenox added for DVT prophylaxis. Presently on dysphagia #2 thin liquid diet. Therapy evaluations completed due to patient decreased functional mobility and left-sided weakness was admitted for a comprehensive rehab program.  Patient transferred to CIR on 02/09/2024 .     Pt presents with decreased activity tolerance, decreased functional mobility, decreased balance, decreased coordination, dysarthria, feelings of stress Limiting pt's independence with leisure/community pursuits.  Met with pt today to discuss TR services including leisure education, activity analysis/modifications and stress management.  Also discussed the importance of social, emotional, spiritual health in addition to physical health and their effects on overall health and wellness.  Pt stated understanding.  Plan  Min 1 session >20 minutes during LOS  Recommendations for other services: None   Discharge Criteria: Patient will be discharged from TR if patient refuses treatment 3 consecutive times without medical reason.  If treatment goals not met, if there is a change in medical status, if patient makes no progress towards goals or if patient is discharged from hospital.  The above assessment, treatment plan, treatment alternatives and  goals were discussed and mutually agreed upon: by patient  Theresa Watkins 02/19/2024, 8:34 AM

## 2024-02-19 NOTE — Progress Notes (Signed)
 SLP Cancellation Note  Patient Details Name: Theresa Watkins MRN: [id number] DOB: January 18, 1966   Cancelled treatment:        Upon entrance, patient asleep in fetal position. Patient aroused by verbal stimulation, though reports bad migraine and was unable to complete OT session this AM. Patient with worse dysarthria from prior and in turn, with reduced speech intelligibility. MD aware. 60 minutes of ST missed this AM. SLP will attempt to return as patient/therapist schedule allows.                                                                                               Lanisha Stepanian M.A., CCC-SLP 02/19/2024, 10:11 AM

## 2024-02-19 NOTE — Progress Notes (Signed)
 Occupational Therapy Session Note  Patient Details  Name: Theresa Watkins MRN: [id number] Date of Birth: 11/06/1966  Today's Date: 02/19/2024 OT Missed Time: 60 Minutes Missed Time Reason: Patient ill (comment) (nausea)   Skilled Therapeutic Interventions/Progress Updates:     Attempted to see Pt for dance-based OT group therapy session, however Pt reporting nausea and unable to participate in session at this time. Will attempt to make up time as schedule and Pt's status allow.   Therapy Documentation Precautions:  Precautions Precautions: Fall Precaution/Restrictions Comments: L hemi Restrictions Weight Bearing Restrictions Per Provider Order: No   Therapy/Group: Individual Therapy  Clide Deutscher 02/19/2024, 3:49 PM

## 2024-02-20 DIAGNOSIS — I63511 Cerebral infarction due to unspecified occlusion or stenosis of right middle cerebral artery: Secondary | ICD-10-CM | POA: Diagnosis not present

## 2024-02-20 NOTE — Progress Notes (Signed)
 Speech Language Pathology Daily Session Note  Patient Details  Name: Theresa Watkins MRN: [id number] Date of Birth: 04/01/1966  Today's Date: 02/20/2024 SLP Individual Time: 0800-0820 and 1210-1230 SLP Individual Time Calculation (min): 20 min and 20 min  Short Term Goals: Week 2: SLP Short Term Goal 1 (Week 2): Patient will consume least restrictive diet with use of compensatory strategies given supervision multimodal A SLP Short Term Goal 2 (Week 2): Patient will increase speech intelligibility to 80% at the sentence level given min multimodal A  Skilled Therapeutic Interventions:   Session 1: SLP conducted skilled therapy session targeting dysphagia management and communication goals. SLP planned to assess patient with upgraded texture meal tray (Dys3/thin liquids), however tray arrived with only grits and eggs. Patient consumed grits, eggs with cheese, and various juices without overt difficulty. SLP additionally observed patient with goldfish crackers. Patient exhibited prolonged mastication time and residue after the initial swallow which was cleared independently with liquid wash when given extra time. As breakfast tray did not allow SLP to ascertain true tolerance of upgraded diet, plan to return with lunch tray to assess patient with more challenging items. Patient in agreement. Patient was left in lowered bed with call bell in reach and bed alarm set. SLP will continue to target goals per plan of care.      Session 2: SLP assessed tolerance of diet with upgraded Dys3/thin liquid meal tray. Across session, patient consumed bite-sized pieces of cheeseburger, collard greens, pinto beans, mashed sweet potatoes, and thin liquids. Across all solid textures, patient continues to demonstrate prolonged mastication time however clearance of the oral cavity is complete independently with use of extra time and liquid wash. No overt s/sx of penetration/aspiration across textures. Recommend formal  upgrade to Dys3/thin liquids with full supervision. Continue to provide medications in puree due to reduced lingual control/manipulation. Patient was left in lowered bed with call bell in reach and bed alarm set with NT present to continue full supervision for remainder of meal. SLP will continue to target goals per plan of care.    Pain Pain Assessment Pain Scale: 0-10 Pain Score: 0-No painNone endorsed  Therapy/Group: Individual Therapy  Jeannie Done, M.A., CCC-SLP  Yetta Barre 02/20/2024, 8:30 AM

## 2024-02-20 NOTE — Plan of Care (Signed)
  Problem: Consults Goal: RH STROKE PATIENT EDUCATION Description: See Patient Education module for education specifics  Outcome: Progressing   Problem: RH BOWEL ELIMINATION Goal: RH STG MANAGE BOWEL WITH ASSISTANCE Description: STG Manage Bowel with toileting Assistance. Outcome: Progressing Goal: RH STG MANAGE BOWEL W/MEDICATION W/ASSISTANCE Description: STG Manage Bowel with Medication with mod I Assistance. Outcome: Progressing   Problem: RH SAFETY Goal: RH STG ADHERE TO SAFETY PRECAUTIONS W/ASSISTANCE/DEVICE Description: STG Adhere to Safety Precautions With cues Assistance/Device. Outcome: Progressing   Problem: RH PAIN MANAGEMENT Goal: RH STG PAIN MANAGED AT OR BELOW PT'S PAIN GOAL Description: Pain managed < 4 with prns Outcome: Progressing   Problem: RH KNOWLEDGE DEFICIT Goal: RH STG INCREASE KNOWLEDGE OF HYPERTENSION Description: Patient and mother will be able to manage HTN using educational resources for medications and dietary modification independently Outcome: Progressing Goal: RH STG INCREASE KNOWLEDGE OF DYSPHAGIA/FLUID INTAKE Description: Patient and mother will be able to manage dysphagia using educational resources for medications and dietary modification independently Outcome: Progressing Goal: RH STG INCREASE KNOWLEGDE OF HYPERLIPIDEMIA Description: Patient and mother will be able to manage HLD using educational resources for medications and dietary modification independently Outcome: Progressing Goal: RH STG INCREASE KNOWLEDGE OF STROKE PROPHYLAXIS Description: Patient and mother will be able to manage secondary risks using educational resources for medications and dietary modification independently Outcome: Progressing   Problem: Consults Goal: RH GENERAL PATIENT EDUCATION Description: See Patient Education module for education specifics. Outcome: Progressing Goal: Skin Care Protocol Initiated - if Braden Score 18 or less Description: If consults are  not indicated, leave blank or document N/A Outcome: Progressing Goal: Nutrition Consult-if indicated Outcome: Progressing Goal: Diabetes Guidelines if Diabetic/Glucose > 140 Description: If diabetic or lab glucose is > 140 mg/dl - Initiate Diabetes/Hyperglycemia Guidelines & Document Interventions  Outcome: Progressing   Problem: RH BOWEL ELIMINATION Goal: RH STG MANAGE BOWEL WITH ASSISTANCE Description: STG Manage Bowel with Assistance. Outcome: Progressing Goal: RH STG MANAGE BOWEL W/MEDICATION W/ASSISTANCE Description: STG Manage Bowel with Medication with Assistance. Outcome: Progressing   Problem: RH BLADDER ELIMINATION Goal: RH STG MANAGE BLADDER WITH ASSISTANCE Description: STG Manage Bladder With Assistance Outcome: Progressing Goal: RH STG MANAGE BLADDER WITH MEDICATION WITH ASSISTANCE Description: STG Manage Bladder With Medication With Assistance. Outcome: Progressing Goal: RH STG MANAGE BLADDER WITH EQUIPMENT WITH ASSISTANCE Description: STG Manage Bladder With Equipment With Assistance Outcome: Progressing   Problem: RH SKIN INTEGRITY Goal: RH STG SKIN FREE OF INFECTION/BREAKDOWN Outcome: Progressing Goal: RH STG MAINTAIN SKIN INTEGRITY WITH ASSISTANCE Description: STG Maintain Skin Integrity With Assistance. Outcome: Progressing Goal: RH STG ABLE TO PERFORM INCISION/WOUND CARE W/ASSISTANCE Description: STG Able To Perform Incision/Wound Care With Assistance. Outcome: Progressing   Problem: RH SAFETY Goal: RH STG ADHERE TO SAFETY PRECAUTIONS W/ASSISTANCE/DEVICE Description: STG Adhere to Safety Precautions With Assistance/Device. Outcome: Progressing Goal: RH STG DECREASED RISK OF FALL WITH ASSISTANCE Description: STG Decreased Risk of Fall With Assistance. Outcome: Progressing   Problem: RH PAIN MANAGEMENT Goal: RH STG PAIN MANAGED AT OR BELOW PT'S PAIN GOAL Outcome: Progressing   Problem: RH KNOWLEDGE DEFICIT GENERAL Goal: RH STG INCREASE KNOWLEDGE OF  SELF CARE AFTER HOSPITALIZATION Outcome: Progressing

## 2024-02-20 NOTE — Progress Notes (Signed)
 Occupational Therapy Session Note  Patient Details  Name: Theresa Watkins MRN: [id number] Date of Birth: 1965-11-28  Today's Date: 02/20/2024 OT Individual Time: 8295-6213 OT Individual Time Calculation (min): 45 min    Short Term Goals: Week 1:  OT Short Term Goal 1 (Week 1): Pt will complete PROM with LUE without VC OT Short Term Goal 1 - Progress (Week 1): Progressing toward goal OT Short Term Goal 2 (Week 1): Pt will use LUE as stabilizer during ADL tasks with Mod A OT Short Term Goal 2 - Progress (Week 1): Met OT Short Term Goal 3 (Week 1): Pt will complete shower transfer at Mod A with LRAD OT Short Term Goal 3 - Progress (Week 1): Progressing toward goal OT Short Term Goal 4 (Week 1): Pt will compplete UB dressing with himi technique at Mod A OT Short Term Goal 4 - Progress (Week 1): Met Week 2:  OT Short Term Goal 1 (Week 2): Pt will complete PROM with LUE without VC OT Short Term Goal 2 (Week 2): Pt will complete shower transfer at Mod A with LRAD OT Short Term Goal 3 (Week 2): Pt will complete toilet transfers with mod A OT Short Term Goal 4 (Week 2): Pt will complete toileting tasks with mod A OT Short Term Goal 5 (Week 2): Pt will complete LB dressing tasks with mod A   Skilled Therapeutic Interventions/Progress Updates:    Pt bed level at time of session, stating she did not want to leave the room this session as she just got back in after PT, agreeable to in room activity and did not want to leave bed. Initially requesting to change from brief > depend but unable to locate in room/stock and focused on LUE activities throughout session - pt guided through [facility] for LUE using no weighted dowel + ace wrap for LUE flex/ext, circles, chest press, horizontal abd/add, supination/pronation all with OT facilitation for form. Pt taking self back to side lying position and alarm on call bell in reach. Pt fatigued this date but participated.   Therapy Documentation Precautions:   Precautions Precautions: Fall Precaution/Restrictions Comments: L hemi Restrictions Weight Bearing Restrictions Per Provider Order: No    Therapy/Group: Individual Therapy  Erasmo Score 02/20/2024, 7:28 AM

## 2024-02-20 NOTE — Progress Notes (Signed)
 Patient ID: Theresa Watkins, female   DOB: 06/03/1966, 58 y.o.   MRN: [id number]  SW received phone call from pt mother reporting she would like to come in for family edu on Monday at 1pm. SW shared will cal back after speaking with scheduler.  *confirms Monday 1pm-4pm is on calendar.    Cecile Sheerer, MSW, LCSW Office: [phone] Cell: [phone] Fax: [phone]

## 2024-02-20 NOTE — Progress Notes (Signed)
 Physical Therapy Session Note  Patient Details  Name: Theresa Watkins MRN: [id number] Date of Birth: [birth year]  Today's Date: 02/20/2024 PT Individual Time: 0900-1015 PT Individual Time Calculation (min): 75 min   Short Term Goals: Week 2:  PT Short Term Goal 1 (Week 2): pt will ambulate 100 ft with assistance PT Short Term Goal 2 (Week 2): Pt will perform bed/chair transfer with CGA consistently PT Short Term Goal 3 (Week 2): Pt will initiate stair training  Skilled Therapeutic Interventions/Progress Updates:    Pt recd in stedy in care of nsg on way to bathroom. Pt assisted with stedy transfer to bathroom with continent B+B void, documented in flowsheet. Pt was able to perform peri care with RLE up on stedy, requested assist to ensure thoroughness. Pt washed hands in standing in stedy. Assist to don pants, pulling over hips in standing with mod HHA. Pt transported to therapy gym for time management and energy conservation.   Performed squat pivot to R side with min a and extensive cueing. Gait training with + 1 HHA and w/c follow x 24 ft. Trialled again but pt reports feeling L hip pain and requesting hand rail for offloading. Transitioned to rail and performed x 2 short bouts, but pt attempting to ambulate laterally with back to wall. Pt states she used walls at home for UE support after previous strokes and with hip pain.  [name] and not open to education on other AD or clinical reasoning for walking forward if not inhibited by pain.   Pt requesting exercises she can do in room, so pt provided with YTB and returned to room. While setting up for transfer and providing instructions, pt attempted transfer without waiting for assist, requiring mod to finish transfer safely after landing partially on bed on her side. Discussed safety and waiting for caregiver to be ready for transfer. Pt then instructed on  and performed several exercises for bed level HEP: bridges, hooklying  abduction with band, knee extension with band, hip circles against band to encourage quad and hip strength. Pt remained in bed and was left with all needs in reach and alarm active.   Therapy Documentation Precautions:  Precautions Precautions: Fall Precaution/Restrictions Comments: L hemi Restrictions Weight Bearing Restrictions Per Provider Order: No General:      Therapy/Group: Individual Therapy  Juluis Rainier 02/20/2024, 10:11 AM

## 2024-02-20 NOTE — Progress Notes (Signed)
 Occupational Therapy Session Note  Patient Details  Name: Theresa Watkins MRN: [id number] Date of Birth: [birth year]  Today's Date: 02/20/2024 OT Individual Time: 3244-0102 OT Individual Time Calculation (min): 30 min  and Today's Date: 02/20/2024 OT Missed Time: 15 Minutes Missed Time Reason: Patient fatigue   Short Term Goals: Week 2:  OT Short Term Goal 1 (Week 2): Pt will complete PROM with LUE without VC OT Short Term Goal 2 (Week 2): Pt will complete shower transfer at Mod A with LRAD OT Short Term Goal 3 (Week 2): Pt will complete toilet transfers with mod A OT Short Term Goal 4 (Week 2): Pt will complete toileting tasks with mod A OT Short Term Goal 5 (Week 2): Pt will complete LB dressing tasks with mod A  Skilled Therapeutic Interventions/Progress Updates:    Pt received supine with no c/o pain, agreeable to OT session but reporting high levels of fatigue. She came to EOB with min A. Squat pivot with min A to the R side, good carryover of RUE placement. Pt was taken via w/c to the therapy gym for time management. She briefly worked on [employer] in the [place] mobile arm support. She was able to complete 90% elbow flex/ext range and complete 30 degrees of shoulder flexion once in 40 degrees supported by MAS. She c/o neck stiffness and a moist heat pack was applied for pain management which she reported helped significantly. No adverse skin reaction to heat. She requested to use the bathroom and was taken back to her room. Min A squat pivot to BSC over toilet, max A for toileting tasks. (+) urine void. Mod A to pivot back to w/c toward L. She returned to supine in bed and declined any further participation. Bed alarm set.   Therapy Documentation Precautions:  Precautions Precautions: Fall Precaution/Restrictions Comments: L hemi Restrictions Weight Bearing Restrictions Per Provider Order: No Therapy/Group: Individual Therapy  Crissie Reese 02/20/2024, 6:05 AM

## 2024-02-20 NOTE — Progress Notes (Signed)
 PROGRESS NOTE   Subjective/Complaints:  Pt reports no side effects from Duloxetine- no nausea.  Feels better than yesterday- less dysarthria as well as only mild HA today.  Said didn't do any therapy yesterday since Fioricet made her loopy.   Said cannot see faces since things blurry since Stroke- never had vision checked prior- just used readers from drug store, but never had vision checked-   LBM per pt yesterday- per chart was 2 days ago.    ROS: Limited by severe dysarthria/cognition Objective:   CT HEAD WO CONTRAST ( ) Result Date: 02/19/2024 CLINICAL DATA:  58 year old female with severe headache, neurologic deficit, right thalamic lacunar infarct in March. EXAM: CT HEAD WITHOUT CONTRAST TECHNIQUE: Contiguous axial images were obtained from the base of the skull through the vertex without intravenous contrast. RADIATION DOSE REDUCTION: This exam was performed according to the departmental dose-optimization program which includes automated exposure control, adjustment of the mA and/or kV according to patient size and/or use of iterative reconstruction technique. COMPARISON:  Head CT 02/07/2024 and earlier. FINDINGS: Brain: Chronic lacunar infarcts in the bilateral deep gray matter nuclei, deep white matter. The more recent right lateral the lamina region lacunar infarct hypodensity has mildly faded since 02/07/2024. Other regional gray-white differentiation is stable. No midline shift, ventriculomegaly, mass effect, evidence of mass lesion, intracranial hemorrhage or evidence of cortically based acute infarction. Vascular: No suspicious intracranial vascular hyperdensity. Skull: Intact, stable. Sinuses/Orbits: Visualized paranasal sinuses and mastoids are stable and well aerated. Other: Visualized orbits and scalp soft tissues are within normal limits. IMPRESSION: 1. 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 2. No acute intracranial abnormality. Electronically Signed   By: Odessa Fleming M.D.   On: 02/19/2024 08:53    No results for input(s): "WBC", "HGB", "HCT", "PLT" in the last 72 hours.   No results for input(s): "NA", "K", "CL", "CO2", "GLUCOSE", "BUN", "CREATININE", "CALCIUM" in the last 72 hours.   No intake or output data in the 24 hours ending 02/20/24 0802       Physical Exam: Vital Signs Blood pressure 114/74, pulse 60, temperature 98 F (36.7 C), resp. rate 16, height 5\' 6"  (1.676 m), weight 64.4 kg, SpO2 93%.         General: awake, alert, appropriate, woke pt up- more interactive; NAD HENT: conjugate gaze; oropharynx moist CV: regular rate and rhythm; no JVD Pulmonary: CTA B/L; no W/R/R- good air movement GI: soft, NT, ND, (+)BS- slightly hypoactive Psychiatric: appropriate- less frustrated about dysarthria Neurological: stretching this AM  C/o mild HA  Severe, harder ot understand dysarthria than 3 days ago, but better than yesterday  Strength exam- LUE ~3/5- is better  LLE 3+ to 4-/5   Hiding under the pillow due to HA  Neurologic: Cranial nerves II through XII intact, motor strength is 5/5 in right , 2-/5 left  deltoid, bicep, tricep, grip, 5/5 Right, 3/5 left hip flexor, knee extensors, 5/5 RIght , 2- left ankle dorsiflexor and plantar flexor  Sensory exam normal sensation to light touch in right upper and lower extremities, feels pinch LLE , no  sensation to pinch in left fingers Cerebellar exam normal finger to nose to finger RIght , unable to perform on left due to weakness Musculoskeletal:  + Pain and limited ROM in L hip internal >> extenal rotation; no deformity or crepitus.  FABER, FADIR with groin pain.   Unchanged 3-30  Assessment/Plan: 1. Functional deficits which require 3+ hours per day of interdisciplinary therapy in a comprehensive inpatient rehab  setting. Physiatrist is providing close team supervision and 24 hour management of active medical problems listed below. Physiatrist and rehab team continue to assess barriers to discharge/monitor patient progress toward functional and medical goals  Care Tool:  Bathing    Body parts bathed by patient: Face, Left arm, Chest, Abdomen, Front perineal area, Right upper leg, Left upper leg, Right lower leg   Body parts bathed by helper: Right arm, Buttocks, Left lower leg     Bathing assist Assist Level: Moderate Assistance - Patient 50 - 74%     Upper Body Dressing/Undressing Upper body dressing   What is the patient wearing?: Pull over shirt    Upper body assist Assist Level: Moderate Assistance - Patient 50 - 74%    Lower Body Dressing/Undressing Lower body dressing      What is the patient wearing?: Underwear/pull up, Pants     Lower body assist Assist for lower body dressing: Maximal Assistance - Patient 25 - 49%     Toileting Toileting    Toileting assist Assist for toileting: Total Assistance - Patient < 25%     Transfers Chair/bed transfer  Transfers assist     Chair/bed transfer assist level: Moderate Assistance - Patient 50 - 74%     Locomotion Ambulation   Ambulation assist      Assist level: 2 helpers (max a +w/c follow) Assistive device: Other (comment) (R hand rail) Max distance: 10   Walk 10 feet activity   Assist     Assist level: 2 helpers (max a +w/c follow) Assistive device: Other (comment) (R hand rail)   Walk 50 feet activity   Assist Walk 50 feet with 2 turns activity did not occur: Safety/medical concerns (L hemiplegia and pt fatigue)         Walk 150 feet activity   Assist Walk 150 feet activity did not occur: Safety/medical concerns (L hemiplegia and pt fatigue)         Walk 10 feet on uneven surface  activity   Assist Walk 10 feet on uneven surfaces activity did not occur: Safety/medical concerns (L  hemiplegia and pt fatigue)         Wheelchair     Assist Is the patient using a wheelchair?: Yes Type of Wheelchair: Manual    Wheelchair assist level: Supervision/Verbal cueing, Minimal Assistance - Patient > 75% Max wheelchair distance: 150    Wheelchair 50 feet with 2 turns activity    Assist        Assist Level: Supervision/Verbal cueing   Wheelchair 150 feet activity     Assist      Assist Level: Minimal Assistance - Patient > 75%   Blood pressure 114/74, pulse 60, temperature 98 F (36.7 C), resp. rate 16, height 5\' 6"  (1.676 m), weight 64.4 kg, SpO2 93%.   Medical Problem List and Plan: 1. 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  Antithrombotics: -DVT/anticoagulation:  Pharmaceutical: Lovenox             -antiplatelet therapy: Aspirin 81 mg daily and Plavix 75 mg daily x 3 weeks then Plavix alone 3. Pain Management: Tylenol as needed  3/25- denies pain- con't regimen rpn   3-30: Ongoing left hip pain; Voltaren gel and Tylenol.  3/31- will start Vit D and Ca per pt request- has helped hip pain in past?  4/1- pt insistent was very helpful already for hip pain  4/2- pt wants at the same time- per chart, Ca and Vit D come at same time  4/3- having new burning pain in LUE- which is seen sometimes with stroke- will add Duloxetine 30 mg daily for nerve pain- monitor for side effects.  -if gets nausea, will change to Lyrica 4/4- no side effects so far- .  4. Mood/Behavior/Sleep: Provide emotional support             -antipsychotic agents: N/A 5. Neuropsych/cognition: This patient is capable of  making decisions on her own behalf. 6. Skin/Wound Care: Routine skin checks 7. Fluids/Electrolytes/Nutrition: Routine and outs with follow-up chemistries 8.  Dysphagia.  Dysphagia #2 thin liquids.  Follow-up speech therapy  3/28- no change in diet so far  3/31- Pt doesn't need provale cup- will let staff know  4/1- d/w SLP- can drink normally  4.2- pt wants ot upgrade diet- explained she's not ready and could get pneumonia if update too early 9.  Hyperlipidemia.  Crestor 10.  Recent History of cocaine/tobacco use.  UDS positive for cocaine.  Provide counseling, this has been a chronic issue , seen in PCP notes 11.  Hypothyroidism.  Synthroid 12.  Constipation.  MiraLAX daily, Senokot-S 1 tab twice daily   3/25- LBM last night after 10 days with no BM  3/26- LBM 2 nights ago- wll monitor- is on Senna 1 tab BID and Miralax- not receiving pain meds, or Baclofen, so less chance of constipation from meds. 3/27- LBM 3 days ago- will give Sorbitol, and soap suds enema- and increase Senna to 2 tabs BID   4/1- LBM 3/30- said attempted ot this AM- on toilet when I saw pt- but appears didn't go? If no results by tomorrow, will intervene more- on Senna 2 tabs BID and Miralax  4/3- LBM yesterday  4/4- LBM 2 days ago- feels like can go with coffee this AM 13. Dysarthria- hard to understand- SLP ordered  4/3- Was worse this AM- will monitor for progress  4/4- slightly better today- can understand after 1-3 repetitions- low threshold to recheck Brain MRI 14. Polycythemia  3/25- Hb 16.1- and BUN up to 18, so is a little dry- BUN was 9 prior.   3/26- will recheck labs Thursday  3/27- Back to 14.9 after drinking more  3/31- will check labs today 15. Blurry vision  3/27- will need Ophtho after d/c.   3-29: Seems to mostly be from the left eye, with difficulty converging and tracking; messaged OT and PT to trial taping versus alternating eye patch to help with convergence.  3-30: Reporting improvement with  use of taping  4/4- pt reports everything blurry- cannot see faces as a result- never  had eye checked in past- used readers but cannot see at a distance since stroke- will send to [facility] after d/c.  16.  Left hip pain.  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 C/O feeling dry  4/1- Labs look good- BUN 15 and Cr 0.69 18. Cough  4/1- didn't hear the cough- sounds clear- if continues, will treat symptomatically.   4/2- pt feels like "coming down with something"- because her back is sore and was "freezing" last night- is 69 degrees in room 19. Severe HA/increased dysarthria  4/3- pt had severe HA this AM as well as increased dysarthria/difficulty speaking- no additional Neuro Sx's, but hadn't been having HA's-ordered STAT CT because my concern for possible new CVA/extension of stroke- Head CT (-)- Sx's are improving and given Fioricet as needed for HA- if continues today, will get MRI, but head CT shows no new CVA. 4/4- HA mild this AM- have a low threshold for doing MRI of brain since dysarthria slightly improved, but still severe- if any other Neuro Sx's this weekend, please get MRI of brain.     I spent a total of 41   minutes on total care today- >50% coordination of care- due to  D/w nursing as well as therapy about function- and education of pt about my concerns about stroke extension-    LOS: 11 days A FACE TO FACE EVALUATION WAS PERFORMED  Cheryl Chay 02/20/2024, 8:02 AM

## 2024-02-21 DIAGNOSIS — I63511 Cerebral infarction due to unspecified occlusion or stenosis of right middle cerebral artery: Secondary | ICD-10-CM | POA: Diagnosis not present

## 2024-02-21 DIAGNOSIS — K5900 Constipation, unspecified: Secondary | ICD-10-CM | POA: Diagnosis not present

## 2024-02-21 DIAGNOSIS — R471 Dysarthria and anarthria: Secondary | ICD-10-CM | POA: Diagnosis not present

## 2024-02-21 DIAGNOSIS — R1310 Dysphagia, unspecified: Secondary | ICD-10-CM | POA: Diagnosis not present

## 2024-02-21 DIAGNOSIS — R519 Headache, unspecified: Secondary | ICD-10-CM

## 2024-02-21 MED ORDER — CALCIUM CITRATE MALATE-VIT D 250-2.5 MG-MCG PO TABS: 2.0000 | ORAL_TABLET | Freq: Every day | ORAL | Status: DC

## 2024-02-21 NOTE — Progress Notes (Signed)
 Speech Language Pathology Daily Session Note  Patient Details  Name: Theresa Watkins MRN: [id number] Date of Birth: 1966/07/05  Today's Date: 02/21/2024 SLP Individual Time: 0902-1001 SLP Individual Time Calculation (min): 59 min  Short Term Goals: Week 2: SLP Short Term Goal 1 (Week 2): Patient will consume least restrictive diet with use of compensatory strategies given supervision multimodal A SLP Short Term Goal 2 (Week 2): Patient will increase speech intelligibility to 80% at the sentence level given min multimodal A  Skilled Therapeutic Interventions:  Patient was seen in am to address dysphagia managemnt and speech intelligibility. Pt was easily alerted upon SLP arrival and agreeable for session. She verbalized recent upgrade to D3 textures on Friday with self perceived good tolerance. Pt agreeable to complete oral hygiene which she did via suction toothbrush while SLP cleaned dentures. She was subsequently instructed in completion of EMST where she completed 5 sets of 5 at 35cm H2O. She  also completed IOPI set at 15 kPa posteriorly and 25 kPA anteriorly with min A. Session was transitioned to address language. Pt recalled 3 of 4 speech strategies with sup A. SLP reviewed additional strategy. Pt was challenged in a structured language task where she communicated an unknown message to listener with use of speech intelligibility strategies. Pt initially requiring min A with cues able to be faded to sup A with ~85% intelligibility. Pt then challenged in conversational speech where she intermittently warranted min A cues for over articulation and pausing. Pt's speech intelligibility ranged from 80- 90% at conversation level. At conclusion of session, pt was left at bedside with call button within reach and bed alarm active. SLP to continue POC.   Pain Pain Assessment Pain Scale: 0-10 Pain Score: 0-No pain  Therapy/Group: Individual Therapy  [name] 02/21/2024, 9:06 AM

## 2024-02-21 NOTE — Progress Notes (Signed)
 PROGRESS NOTE   Subjective/Complaints: Patient would like to take her calcium citrate from home.  Continues to have dysarthria, no changes reported.  ROS: Limited by severe dysarthria/cognition Objective:   No results found.   No results for input(s): "WBC", "HGB", "HCT", "PLT" in the last 72 hours.   No results for input(s): "NA", "K", "CL", "CO2", "GLUCOSE", "BUN", "CREATININE", "CALCIUM" in the last 72 hours.    Intake/Output Summary (Last 24 hours) at 02/21/2024 1151 Last data filed at 02/21/2024 0758 Gross per 24 hour  Intake 360 ml  Output --  Net 360 ml         Physical Exam: Vital Signs Blood pressure 99/65, pulse (!) 54, temperature 98 F (36.7 C), temperature source Oral, resp. rate 16, height 5\' 6"  (1.676 m), weight 64.4 kg, SpO2 98%.         General: awake, alert, appropriate, interactive, NAD HENT: conjugate gaze; oropharynx moist CV: regular rate and rhythm; no JVD Pulmonary: CTA B/L; no W/R/R- good air movement GI: soft, NT, ND, (+)BS- slightly hypoactive Psychiatric: appropriate-continues to be frustrated about dysarthria Neurological:   Severe, continued dysarthria  Strength exam- LUE ~3/5- is better  LLE 3+ to 4-/5    Neurologic: Cranial nerves II through XII intact, motor strength is 5/5 in right , 2-/5 left  deltoid, bicep, tricep, grip, 5/5 Right, 3/5 left hip flexor, knee extensors, 5/5 RIght , 2- left ankle dorsiflexor and plantar flexor  Sensory exam normal sensation to light touch in right upper and lower extremities, feels pinch LLE , no sensation to pinch in left fingers Cerebellar exam normal finger to nose to finger RIght , unable to perform on left due to weakness Musculoskeletal:  + Pain and limited ROM in L hip internal >> extenal rotation; no deformity or crepitus.  FABER, FADIR with groin pain.   Unchanged 4/5  Assessment/Plan: 1. Functional deficits which require 3+  hours per day of interdisciplinary therapy in a comprehensive inpatient rehab setting. Physiatrist is providing close team supervision and 24 hour management of active medical problems listed below. Physiatrist and rehab team continue to assess barriers to discharge/monitor patient progress toward functional and medical goals  Care Tool:  Bathing    Body parts bathed by patient: Face, Left arm, Chest, Abdomen, Front perineal area, Right upper leg, Left upper leg, Right lower leg   Body parts bathed by helper: Right arm, Buttocks, Left lower leg     Bathing assist Assist Level: Moderate Assistance - Patient 50 - 74%     Upper Body Dressing/Undressing Upper body dressing   What is the patient wearing?: Pull over shirt    Upper body assist Assist Level: Moderate Assistance - Patient 50 - 74%    Lower Body Dressing/Undressing Lower body dressing      What is the patient wearing?: Underwear/pull up, Pants     Lower body assist Assist for lower body dressing: Maximal Assistance - Patient 25 - 49%     Toileting Toileting    Toileting assist Assist for toileting: Total Assistance - Patient < 25%     Transfers Chair/bed transfer  Transfers assist     Chair/bed transfer assist level:  Moderate Assistance - Patient 50 - 74%     Locomotion Ambulation   Ambulation assist      Assist level: 2 helpers (max a +w/c follow) Assistive device: Other (comment) (R hand rail) Max distance: 10   Walk 10 feet activity   Assist     Assist level: 2 helpers (max a +w/c follow) Assistive device: Other (comment) (R hand rail)   Walk 50 feet activity   Assist Walk 50 feet with 2 turns activity did not occur: Safety/medical concerns (L hemiplegia and pt fatigue)         Walk 150 feet activity   Assist Walk 150 feet activity did not occur: Safety/medical concerns (L hemiplegia and pt fatigue)         Walk 10 feet on uneven surface  activity   Assist Walk 10  feet on uneven surfaces activity did not occur: Safety/medical concerns (L hemiplegia and pt fatigue)         Wheelchair     Assist Is the patient using a wheelchair?: Yes Type of Wheelchair: Manual    Wheelchair assist level: Supervision/Verbal cueing, Minimal Assistance - Patient > 75% Max wheelchair distance: 150    Wheelchair 50 feet with 2 turns activity    Assist        Assist Level: Supervision/Verbal cueing   Wheelchair 150 feet activity     Assist      Assist Level: Minimal Assistance - Patient > 75%   Blood pressure 99/65, pulse (!) 54, temperature 98 F (36.7 C), temperature source Oral, resp. rate 16, height 5\' 6"  (1.676 m), weight 64.4 kg, SpO2 98%.   Medical Problem List and Plan: 1. 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  Antithrombotics: -DVT/anticoagulation:  Pharmaceutical: Lovenox             -antiplatelet therapy: Aspirin 81 mg daily and Plavix 75 mg daily x 3 weeks then Plavix alone 3. Pain Management: Tylenol as needed  3/25- denies pain- con't regimen rpn   3-30: Ongoing left hip pain; Voltaren gel and Tylenol.  3/31- will start Vit D and Ca per pt request- has helped hip pain in past?  4/1- pt insistent was very helpful already for hip pain  4/2- pt wants at the same time- per chart, Ca and Vit D come at same time  4/3- having new burning pain in LUE- which is seen sometimes with stroke- will add  Duloxetine 30 mg daily for nerve pain- monitor for side effects.  -if gets nausea, will change to Lyrica 4/4-5 no side effects so far- .  4. Mood/Behavior/Sleep: Provide emotional support             -antipsychotic agents: N/A 5. Neuropsych/cognition: This patient is capable of making decisions on her own behalf. 6. Skin/Wound Care: Routine skin checks 7. Fluids/Electrolytes/Nutrition: Routine and  outs with follow-up chemistries 8.  Dysphagia.  Dysphagia #2 thin liquids.  Follow-up speech therapy  3/28- no change in diet so far  3/31- Pt doesn't need provale cup- will let staff know  4/1- d/w SLP- can drink normally  4.2- pt wants ot upgrade diet- explained she's not ready and could get pneumonia if update too early  4/4.  She was started on D3 thin diet yesterday, continue to monitor 9.  Hyperlipidemia.  Crestor 10.  Recent History of cocaine/tobacco use.  UDS positive for cocaine.  Provide counseling, this has been a chronic issue , seen in PCP notes 11.  Hypothyroidism.  Synthroid 12.  Constipation.  MiraLAX daily, Senokot-S 1 tab twice daily   3/25- LBM last night after 10 days with no BM  3/26- LBM 2 nights ago- wll monitor- is on Senna 1 tab BID and Miralax- not receiving pain meds, or Baclofen, so less chance of constipation from meds. 3/27- LBM 3 days ago- will give Sorbitol, and soap suds enema- and increase Senna to 2 tabs BID   4/1- LBM 3/30- said attempted ot this AM- on toilet when I saw pt- but appears didn't go? If no results by tomorrow, will intervene more- on Senna 2 tabs BID and Miralax  4/3- LBM yesterday  4/4- LBM 2 days ago- feels like can go with coffee this AM 13. Dysarthria- hard to understand- SLP ordered  4/3- Was worse this AM- will monitor for progress  4/4- slightly better today- can understand after 1-3 repetitions- low threshold to recheck Brain MRI  4/5 appears stable based on description from prior days 14. Polycythemia  3/25- Hb 16.1- and BUN up to 18,  so is a little dry- BUN was 9 prior.   3/26- will recheck labs Thursday  3/27- Back to 14.9 after drinking more  3/31- will check labs today 15. Blurry vision  3/27- will need Ophtho after d/c.   3-29: Seems to mostly be from the left eye, with difficulty converging and tracking; messaged OT and PT to trial taping versus alternating eye patch to help with convergence.  3-30: Reporting improvement with use of taping  4/4- pt reports everything blurry- cannot see faces as a result- never had eye checked in past- used readers but cannot see at a distance since stroke- will send to Ophtho after d/c.  16.  Left hip pain.  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 C/O feeling dry  4/1- Labs look good- BUN 15 and Cr 0.69 18. Cough  4/1- didn't hear the cough- sounds clear- if continues, will treat symptomatically.   4/2- pt feels like "coming down with something"- because her back is sore and was "freezing" last night- is 69 degrees in room 19. Severe HA/increased dysarthria  4/3- pt had severe HA this AM as well as increased dysarthria/difficulty speaking- no additional Neuro Sx's, but hadn't been having HA's-ordered STAT CT because my concern for possible new CVA/extension of stroke- Head CT (-)- Sx's are improving and given Fioricet as needed for HA- if continues today, will get MRI, but head CT shows no new CVA. 4/4- HA mild this AM- have a low threshold for doing MRI of brain since dysarthria slightly improved, but still severe- if any other Neuro Sx's this weekend, please get MRI of  brain.  4/5 stable, headache improved, continue to monitor.  Continues to have dysarthria but can understand her for the most part    LOS: 12 days A FACE TO FACE EVALUATION WAS PERFORMED  Fanny Dance 02/21/2024, 11:51 AM

## 2024-02-21 NOTE — Plan of Care (Signed)
  Problem: Consults Goal: RH STROKE PATIENT EDUCATION Description: See Patient Education module for education specifics  Outcome: Progressing   Problem: RH BOWEL ELIMINATION Goal: RH STG MANAGE BOWEL WITH ASSISTANCE Description: STG Manage Bowel with toileting Assistance. Outcome: Progressing Goal: RH STG MANAGE BOWEL W/MEDICATION W/ASSISTANCE Description: STG Manage Bowel with Medication with mod I Assistance. Outcome: Progressing   Problem: RH SAFETY Goal: RH STG ADHERE TO SAFETY PRECAUTIONS W/ASSISTANCE/DEVICE Description: STG Adhere to Safety Precautions With cues Assistance/Device. Outcome: Progressing   Problem: RH PAIN MANAGEMENT Goal: RH STG PAIN MANAGED AT OR BELOW PT'S PAIN GOAL Description: Pain managed < 4 with prns Outcome: Progressing

## 2024-02-21 NOTE — Progress Notes (Signed)
 Occupational Therapy Session Note  Patient Details  Name: Theresa Watkins MRN: [id number] Date of Birth: 1966-02-03  Today's Date: 02/21/2024 OT Individual Time: 0700-0745 OT Individual Time Calculation (min): 45 min    Short Term Goals: Week 2:  OT Short Term Goal 1 (Week 2): Pt will complete PROM with LUE without VC OT Short Term Goal 2 (Week 2): Pt will complete shower transfer at Mod A with LRAD OT Short Term Goal 3 (Week 2): Pt will complete toilet transfers with mod A OT Short Term Goal 4 (Week 2): Pt will complete toileting tasks with mod A OT Short Term Goal 5 (Week 2): Pt will complete LB dressing tasks with mod A  Skilled Therapeutic Interventions/Progress Updates:    Pt resting in bed upon arrival. Pt requested to eat breakfast but declined getting OOB. Pt assisted with repositioning in bed using RUE and RLE to assist with pulling up in bed. Skilled OT intervention with focus on self feeding and adherence to swallowing precautions. OTA assisted pt with using LUE as stabilizer during self feeding. Mod verbal cues for clearing oral cavity and portion control. Max verbal cues to not talk with food in mouth. Pt able to feed self without physical assistance after setup. Pt remained in bed eating breakfast. NT notified of pt's status. Bed alarm activated.   Therapy Documentation Precautions:  Precautions Precautions: Fall Precaution/Restrictions Comments: L hemi Restrictions Weight Bearing Restrictions Per Provider Order: No Pain: Pt reports her HA is better  Therapy/Group: Individual Therapy  Rich Brave 02/21/2024, 7:45 AM

## 2024-02-22 DIAGNOSIS — K5900 Constipation, unspecified: Secondary | ICD-10-CM

## 2024-02-22 DIAGNOSIS — R1310 Dysphagia, unspecified: Secondary | ICD-10-CM | POA: Diagnosis not present

## 2024-02-22 DIAGNOSIS — I63511 Cerebral infarction due to unspecified occlusion or stenosis of right middle cerebral artery: Secondary | ICD-10-CM | POA: Diagnosis not present

## 2024-02-22 DIAGNOSIS — R519 Headache, unspecified: Secondary | ICD-10-CM | POA: Diagnosis not present

## 2024-02-22 DIAGNOSIS — R471 Dysarthria and anarthria: Secondary | ICD-10-CM | POA: Diagnosis not present

## 2024-02-22 MED ORDER — CALCIUM CITRATE 950 (200 CA) MG PO TABS
200.0000 mg | ORAL_TABLET | Freq: Every day | ORAL | Status: DC
Start: 2024-02-22 — End: 2024-04-28
  Administered 2024-02-22 – 2024-04-28 (×66): 950 mg via ORAL
  Filled 2024-02-22 (×68): qty 1

## 2024-02-22 NOTE — Plan of Care (Signed)
  Problem: Consults Goal: RH STROKE PATIENT EDUCATION Description: See Patient Education module for education specifics  Outcome: Progressing   Problem: RH BOWEL ELIMINATION Goal: RH STG MANAGE BOWEL WITH ASSISTANCE Description: STG Manage Bowel with toileting Assistance. Outcome: Progressing   Problem: RH BOWEL ELIMINATION Goal: RH STG MANAGE BOWEL W/MEDICATION W/ASSISTANCE Description: STG Manage Bowel with Medication with mod I Assistance. Outcome: Progressing   Problem: RH SAFETY Goal: RH STG ADHERE TO SAFETY PRECAUTIONS W/ASSISTANCE/DEVICE Description: STG Adhere to Safety Precautions With cues Assistance/Device. Outcome: Progressing   Problem: RH PAIN MANAGEMENT Goal: RH STG PAIN MANAGED AT OR BELOW PT'S PAIN GOAL Description: Pain managed < 4 with prns Outcome: Progressing   Problem: RH KNOWLEDGE DEFICIT Goal: RH STG INCREASE KNOWLEDGE OF HYPERTENSION Description: Patient and mother will be able to manage HTN using educational resources for medications and dietary modification independently Outcome: Progressing

## 2024-02-22 NOTE — Progress Notes (Signed)
 PROGRESS NOTE   Subjective/Complaints: No new concerns this AM. She would like to get hospital version or her calcium/vit D. She reports breakfast was cold by the time she got it, didn't eat as much because of this.   LBM 4/4  ROS: Denies CP, SOB, N/V/D Objective:   No results found.   No results for input(s): "WBC", "HGB", "HCT", "PLT" in the last 72 hours.   No results for input(s): "NA", "K", "CL", "CO2", "GLUCOSE", "BUN", "CREATININE", "CALCIUM" in the last 72 hours.    Intake/Output Summary (Last 24 hours) at 02/22/2024 1045 Last data filed at 02/22/2024 0753 Gross per 24 hour  Intake 720 ml  Output --  Net 720 ml         Physical Exam: Vital Signs Blood pressure 105/80, pulse (!) 56, temperature 97.8 F (36.6 C), resp. rate 18, height 5\' 6"  (1.676 m), weight 64.4 kg, SpO2 92%.         General: awake, alert, appropriate, interactive, NAD HENT: conjugate gaze; oropharynx moist CV: regular rate and rhythm; no JVD Pulmonary: CTA B/L; no W/R/R- good air movement GI: soft, NT, ND, (+)BS Psychiatric: very pleasant  Neurological:   Severe, continued dysarthria, sounds like its a little improved, able to understand for most part   Strength exam- LUE ~3/5- is better  LLE 3+ to 4-/5    Neurologic: Cranial nerves II through XII intact, motor strength is 5/5 in right , 2-/5 left  deltoid, bicep, tricep, grip, 5/5 Right, 3/5 left hip flexor, knee extensors, 5/5 RIght , 2- left ankle dorsiflexor and plantar flexor  Sensory exam normal sensation to light touch in right upper and lower extremities, feels pinch LLE , no sensation to pinch in left fingers Cerebellar exam normal finger to nose to finger RIght , unable to perform on left due to weakness Musculoskeletal:  + Pain and limited ROM in L hip internal >> extenal rotation; no deformity or crepitus.  FABER, FADIR with groin pain.   Unchanged  4/6  Assessment/Plan: 1. Functional deficits which require 3+ hours per day of interdisciplinary therapy in a comprehensive inpatient rehab setting. Physiatrist is providing close team supervision and 24 hour management of active medical problems listed below. Physiatrist and rehab team continue to assess barriers to discharge/monitor patient progress toward functional and medical goals  Care Tool:  Bathing    Body parts bathed by patient: Face, Left arm, Chest, Abdomen, Front perineal area, Right upper leg, Left upper leg, Right lower leg   Body parts bathed by helper: Right arm, Buttocks, Left lower leg     Bathing assist Assist Level: Moderate Assistance - Patient 50 - 74%     Upper Body Dressing/Undressing Upper body dressing   What is the patient wearing?: Pull over shirt    Upper body assist Assist Level: Moderate Assistance - Patient 50 - 74%    Lower Body Dressing/Undressing Lower body dressing      What is the patient wearing?: Underwear/pull up, Pants     Lower body assist Assist for lower body dressing: Maximal Assistance - Patient 25 - 49%     Toileting Toileting    Toileting assist Assist for  toileting: Total Assistance - Patient < 25%     Transfers Chair/bed transfer  Transfers assist     Chair/bed transfer assist level: Moderate Assistance - Patient 50 - 74%     Locomotion Ambulation   Ambulation assist      Assist level: 2 helpers (max a +w/c follow) Assistive device: Other (comment) (R hand rail) Max distance: 10   Walk 10 feet activity   Assist     Assist level: 2 helpers (max a +w/c follow) Assistive device: Other (comment) (R hand rail)   Walk 50 feet activity   Assist Walk 50 feet with 2 turns activity did not occur: Safety/medical concerns (L hemiplegia and pt fatigue)         Walk 150 feet activity   Assist Walk 150 feet activity did not occur: Safety/medical concerns (L hemiplegia and pt fatigue)          Walk 10 feet on uneven surface  activity   Assist Walk 10 feet on uneven surfaces activity did not occur: Safety/medical concerns (L hemiplegia and pt fatigue)         Wheelchair     Assist Is the patient using a wheelchair?: Yes Type of Wheelchair: Manual    Wheelchair assist level: Supervision/Verbal cueing, Minimal Assistance - Patient > 75% Max wheelchair distance: 150    Wheelchair 50 feet with 2 turns activity    Assist        Assist Level: Supervision/Verbal cueing   Wheelchair 150 feet activity     Assist      Assist Level: Minimal Assistance - Patient > 75%   Blood pressure 105/80, pulse (!) 56, temperature 97.8 F (36.6 C), resp. rate 18, height 5\' 6"  (1.676 m), weight 64.4 kg, SpO2 92%.   Medical Problem List and Plan: 1. 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  Antithrombotics: -DVT/anticoagulation:  Pharmaceutical: Lovenox             -antiplatelet therapy: Aspirin 81 mg daily and Plavix 75 mg daily x 3 weeks then Plavix alone 3. Pain Management: Tylenol as needed  3/25- denies pain- con't regimen rpn   3-30: Ongoing left hip pain; Voltaren gel and Tylenol.  3/31- will start Vit D and Ca per pt request- has helped hip pain in past?  4/1- pt insistent was very helpful already for hip pain  4/2- pt wants at the same time- per chart, Ca and Vit D come at same time  4/3- having new burning  pain in LUE- which is seen sometimes with stroke- will add Duloxetine 30 mg daily for nerve pain- monitor for side effects.  -if gets nausea, will change to Lyrica 4/4-6 no side effects so far- .  4. Mood/Behavior/Sleep: Provide emotional support             -antipsychotic agents:  N/A 5. Neuropsych/cognition: This patient is capable of making decisions on her own behalf. 6. Skin/Wound Care: Routine skin checks 7. Fluids/Electrolytes/Nutrition: Routine and outs with follow-up chemistries 8.  Dysphagia.  Dysphagia #2 thin liquids.  Follow-up speech therapy  3/28- no change in diet so far  3/31- Pt doesn't need provale cup- will let staff know  4/1- d/w SLP- can drink normally  4.2- pt wants ot upgrade diet- explained she's not ready and could get pneumonia if update too early  4/4.  She was started on D3 thin diet yesterday, continue to monitor  4/6  Eating 50-100% of meals 9.  Hyperlipidemia.  Crestor 10.  Recent History of cocaine/tobacco use.  UDS positive for cocaine.  Provide counseling, this has been a chronic issue , seen in PCP notes 11.  Hypothyroidism.  Synthroid 12.  Constipation.  MiraLAX daily, Senokot-S 1 tab twice daily   3/25- LBM last night after 10 days with no BM  3/26- LBM 2 nights ago- wll monitor- is on Senna 1 tab BID and Miralax- not receiving pain meds, or Baclofen, so less chance of constipation from meds. 3/27- LBM 3 days ago- will give Sorbitol, and soap suds enema- and increase Senna to 2 tabs BID   4/1- LBM 3/30- said attempted ot this AM- on toilet when I saw pt- but appears didn't go? If no results by tomorrow, will intervene more- on Senna 2 tabs BID and Miralax  4/3- LBM yesterday  4/4- LBM 2 days ago- feels like can go with coffee this AM  4/5 LBM 4/4, consider additional medicine tomorrow if no bowel movement today 13. Dysarthria- hard to understand- SLP ordered  4/3- Was worse this AM- will monitor for progress  4/4- slightly better today- can understand  after 1-3 repetitions- low threshold to recheck Brain MRI  4/6 appears stable/vs a little improved  based on description from prior days 14. Polycythemia  3/25- Hb 16.1- and BUN up to 18, so is a little dry- BUN was 9 prior.   3/26- will recheck labs Thursday  3/27- Back to 14.9 after drinking more  3/31- will check labs today 15. Blurry vision  3/27- will need Ophtho after d/c.   3-29: Seems to mostly be from the left eye, with difficulty converging and tracking; messaged OT and PT to trial taping versus alternating eye patch to help with convergence.  3-30: Reporting improvement with use of taping  4/4- pt reports everything blurry- cannot see faces as a result- never had eye checked in past- used readers but cannot see at a distance since stroke- will send to Ophtho after d/c.  16.  Left hip pain.  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 C/O feeling dry  4/1- Labs look good- BUN 15 and Cr 0.69 18. Cough  4/1- didn't hear the cough- sounds clear- if continues, will treat symptomatically.   4/2- pt feels like "coming down with something"- because her back is sore and was "freezing" last night- is 69 degrees in room 19. Severe HA/increased dysarthria  4/3- pt had severe HA this AM as well as increased dysarthria/difficulty speaking- no additional Neuro Sx's, but hadn't been having HA's-ordered STAT CT because my concern for possible new CVA/extension of stroke- Head CT (-)- Sx's are improving and given Fioricet as  needed for HA- if continues today, will get MRI, but head CT shows no new CVA. 4/4- HA mild this AM- have a low threshold for doing MRI of brain since dysarthria slightly improved, but still severe- if any other Neuro Sx's this weekend, please get MRI of brain.  4/5-6 stable, headache improved,  continue to monitor.  Continues to have severe dysarthria but can understand her for the most part    LOS: 13 days A FACE TO FACE EVALUATION WAS PERFORMED  Fanny Dance 02/22/2024, 10:45 AM

## 2024-02-22 NOTE — Plan of Care (Signed)
  Problem: Consults Goal: RH STROKE PATIENT EDUCATION Description: See Patient Education module for education specifics  Outcome: Progressing   Problem: RH BOWEL ELIMINATION Goal: RH STG MANAGE BOWEL WITH ASSISTANCE Description: STG Manage Bowel with toileting Assistance. Outcome: Progressing Goal: RH STG MANAGE BOWEL W/MEDICATION W/ASSISTANCE Description: STG Manage Bowel with Medication with mod I Assistance. Outcome: Progressing   Problem: RH BOWEL ELIMINATION Goal: RH STG MANAGE BOWEL W/MEDICATION W/ASSISTANCE Description: STG Manage Bowel with Medication with mod I Assistance. Outcome: Progressing   Problem: RH SAFETY Goal: RH STG ADHERE TO SAFETY PRECAUTIONS W/ASSISTANCE/DEVICE Description: STG Adhere to Safety Precautions With cues Assistance/Device. Outcome: Progressing   Problem: RH PAIN MANAGEMENT Goal: RH STG PAIN MANAGED AT OR BELOW PT'S PAIN GOAL Description: Pain managed < 4 with prns Outcome: Progressing   Problem: Consults Goal: RH GENERAL PATIENT EDUCATION Description: See Patient Education module for education specifics. Outcome: Progressing Goal: Skin Care Protocol Initiated - if Braden Score 18 or less Description: If consults are not indicated, leave blank or document N/A Outcome: Progressing Goal: Nutrition Consult-if indicated Outcome: Progressing Goal: Diabetes Guidelines if Diabetic/Glucose > 140 Description: If diabetic or lab glucose is > 140 mg/dl - Initiate Diabetes/Hyperglycemia Guidelines & Document Interventions  Outcome: Progressing

## 2024-02-22 NOTE — Progress Notes (Signed)
 Physical Therapy Session Note  Patient Details  Name: ALEXIA DINGER MRN: [id number] Date of Birth: 1966/01/22  Today's Date: 02/22/2024 PT Missed Time: 60 Minutes Missed Time Reason: Patient ill (Comment);Patient unwilling to participate  Pt refusing PM therapy session - reports upset stomach and diarrhea. Will attempt to make up missed time as able. Missed 60 minutes of therapy treatment.     Therapy/Group: Individual Therapy  Orrin Brigham 02/22/2024, 7:51 AM

## 2024-02-23 DIAGNOSIS — I63511 Cerebral infarction due to unspecified occlusion or stenosis of right middle cerebral artery: Secondary | ICD-10-CM | POA: Diagnosis not present

## 2024-02-23 MED ORDER — TOPIRAMATE 25 MG PO TABS
50.0000 mg | ORAL_TABLET | Freq: Every day | ORAL | Status: DC
  Administered 2024-02-23 – 2024-03-10 (×17): 50 mg via ORAL
  Filled 2024-02-23 (×17): qty 2

## 2024-02-23 NOTE — Progress Notes (Signed)
 PROGRESS NOTE   Subjective/Complaints: Pt reports breakfast cold already HA 8/10 this AM- its "bad".  Notes that she feels it's due to blurry vision.   Feels like been having almost every day.  Notes had 3 VCVA's so far- doesn't remember last one kind of stroke.  LBM yesterday- coming more regularly.   ROS:  Pt denies SOB, abd pain, CP, N/V/C/D, and vision changes    Objective:   No results found.   No results for input(s): "WBC", "HGB", "HCT", "PLT" in the last 72 hours.   No results for input(s): "NA", "K", "CL", "CO2", "GLUCOSE", "BUN", "CREATININE", "CALCIUM" in the last 72 hours.    Intake/Output Summary (Last 24 hours) at 02/23/2024 0831 Last data filed at 02/23/2024 0806 Gross per 24 hour  Intake 460 ml  Output --  Net 460 ml         Physical Exam: Vital Signs Blood pressure 109/70, pulse 68, temperature 98 F (36.7 C), resp. rate 18, height 5\' 6"  (1.676 m), weight 64.4 kg, SpO2 97%.          General: awake, alert, appropriate, sitting up in bed; NT getting pt s/u for eating; NAD HENT: conjugate gaze; oropharynx moist CV: regular rate and rhythm; no JVD Pulmonary: CTA B/L; no W/R/R- good air movement GI: soft, NT, ND, (+)BS Psychiatric: appropriate Neurological: Ox3  Moderately severe dysarthria- slightly better- like when she first came in  Strength exam- LUE ~3/5- is better  LLE 3+ to 4-/5    Neurologic: Cranial nerves II through XII intact, motor strength is 5/5 in right , 2-/5 left  deltoid, bicep, tricep, grip, 5/5 Right, 3/5 left hip flexor, knee extensors, 5/5 RIght , 2- left ankle dorsiflexor and plantar flexor  Sensory exam normal sensation to light touch in right upper and lower extremities, feels pinch LLE , no sensation to pinch in left fingers Cerebellar exam normal finger to nose to finger RIght , unable to perform on left due to weakness Musculoskeletal:  + Pain and limited  ROM in L hip internal >> extenal rotation; no deformity or crepitus.  FABER, FADIR with groin pain.   Unchanged 4/6  Assessment/Plan: 1. Functional deficits which require 3+ hours per day of interdisciplinary therapy in a comprehensive inpatient rehab setting. Physiatrist is providing close team supervision and 24 hour management of active medical problems listed below. Physiatrist and rehab team continue to assess barriers to discharge/monitor patient progress toward functional and medical goals  Care Tool:  Bathing    Body parts bathed by patient: Face, Left arm, Chest, Abdomen, Front perineal area, Right upper leg, Left upper leg, Right lower leg   Body parts bathed by helper: Right arm, Buttocks, Left lower leg     Bathing assist Assist Level: Moderate Assistance - Patient 50 - 74%     Upper Body Dressing/Undressing Upper body dressing   What is the patient wearing?: Pull over shirt    Upper body assist Assist Level: Moderate Assistance - Patient 50 - 74%    Lower Body Dressing/Undressing Lower body dressing      What is the patient wearing?: Underwear/pull up, Pants     Lower body assist  Assist for lower body dressing: Maximal Assistance - Patient 25 - 49%     Toileting Toileting    Toileting assist Assist for toileting: Total Assistance - Patient < 25%     Transfers Chair/bed transfer  Transfers assist     Chair/bed transfer assist level: Moderate Assistance - Patient 50 - 74%     Locomotion Ambulation   Ambulation assist      Assist level: 2 helpers (max a +w/c follow) Assistive device: Other (comment) (R hand rail) Max distance: 10   Walk 10 feet activity   Assist     Assist level: 2 helpers (max a +w/c follow) Assistive device: Other (comment) (R hand rail)   Walk 50 feet activity   Assist Walk 50 feet with 2 turns activity did not occur: Safety/medical concerns (L hemiplegia and pt fatigue)         Walk 150 feet  activity   Assist Walk 150 feet activity did not occur: Safety/medical concerns (L hemiplegia and pt fatigue)         Walk 10 feet on uneven surface  activity   Assist Walk 10 feet on uneven surfaces activity did not occur: Safety/medical concerns (L hemiplegia and pt fatigue)         Wheelchair     Assist Is the patient using a wheelchair?: Yes Type of Wheelchair: Manual    Wheelchair assist level: Supervision/Verbal cueing, Minimal Assistance - Patient > 75% Max wheelchair distance: 150    Wheelchair 50 feet with 2 turns activity    Assist        Assist Level: Supervision/Verbal cueing   Wheelchair 150 feet activity     Assist      Assist Level: Minimal Assistance - Patient > 75%   Blood pressure 109/70, pulse 68, temperature 98 F (36.7 C), resp. rate 18, height 5\' 6"  (1.676 m), weight 64.4 kg, SpO2 97%.   Medical Problem List and Plan: 1. 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  Antithrombotics: -DVT/anticoagulation:  Pharmaceutical: Lovenox             -antiplatelet therapy: Aspirin 81 mg daily and Plavix 75 mg daily x 3 weeks then Plavix alone 3. Pain Management: Tylenol as needed  3/25- denies pain- con't regimen rpn   3-30: Ongoing left hip pain; Voltaren gel and Tylenol.  3/31- will start Vit D and Ca per pt request- has helped hip pain in past?  4/1- pt insistent was very helpful already for hip pain  4/2- pt wants at the same time- per chart, Ca and Vit D come at same time  4/3- having new burning pain in LUE- which is seen sometimes with stroke- will add Duloxetine 30 mg daily for nerve pain- monitor for side  effects.  -if gets nausea, will change to Lyrica 4/4-6 no side effects so far- .  4. Mood/Behavior/Sleep: Provide emotional support             -antipsychotic agents: N/A 5. Neuropsych/cognition: This patient is capable of making decisions on her own behalf. 6.  Skin/Wound Care: Routine skin checks 7. Fluids/Electrolytes/Nutrition: Routine and outs with follow-up chemistries 8.  Dysphagia.  Dysphagia #2 thin liquids.  Follow-up speech therapy  3/28- no change in diet so far  3/31- Pt doesn't need provale cup- will let staff know  4/1- d/w SLP- can drink normally  4.2- pt wants ot upgrade diet- explained she's not ready and could get pneumonia if update too early  4/4.  She was started on D3 thin diet yesterday, continue to monitor  4/6  Eating 50-100% of meals 9.  Hyperlipidemia.  Crestor 10.  Recent History of cocaine/tobacco use.  UDS positive for cocaine.  Provide counseling, this has been a chronic issue , seen in PCP notes 11.  Hypothyroidism.  Synthroid 12.  Constipation.  MiraLAX daily, Senokot-S 1 tab twice daily   3/25- LBM last night after 10 days with no BM  4/7- LBM yesterday 13. Dysarthria- hard to understand- SLP ordered  4/3- Was worse this AM- will monitor for progress  4/4- slightly better today- can understand after 1-3 repetitions- low threshold to recheck Brain MRI  4/6 appears stable/vs a little improved  based on description from prior days  4/7- still somewhat hard to understand but getting back to where it was when came in.  14. Polycythemia  3/25- Hb 16.1- and BUN up to 18, so is a little dry- BUN was 9 prior.   3/26- will recheck labs Thursday  3/27- Back to 14.9 after drinking more  3/31- will check labs today 15. Blurry vision  3/27- will need Ophtho after d/c.   3-29: Seems to mostly be from the left eye, with difficulty converging and tracking; messaged OT and PT to trial taping versus alternating eye patch to help with convergence.  3-30: Reporting  improvement with use of taping  4/4- pt reports everything blurry- cannot see faces as a result- never had eye checked in past- used readers but cannot see at a distance since stroke- will send to Ophtho after d/c.   4/7- d/w pt again- she wants eyeglasses- explained we cannot get when in hospital- also per guidelines, since vision can still change, to not get for 3 months after stroke. She disbelieves it's from stroke, since had 2 prior- that didn't cause vision changes- we educated pt pn how this can occur.  16.  Left hip pain.  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 C/O feeling dry  4/1- Labs look good- BUN 15 and Cr 0.69 18. Cough  4/1- didn't hear the cough- sounds clear- if continues, will treat symptomatically.   4/2- pt feels like "coming down with something"- because her back is sore and was "freezing" last night- is 69 degrees in room 19. Severe HA/increased dysarthria  4/3- pt had severe HA this AM as well as increased dysarthria/difficulty speaking- no additional Neuro Sx's, but hadn't been having HA's-ordered STAT CT because my concern for possible new CVA/extension of stroke- Head CT (-)- Sx's are improving and given Fioricet as needed for HA- if continues today, will get MRI, but head CT shows no new CVA. 4/4- HA mild this AM- have a low threshold for doing MRI of brain since dysarthria slightly improved, but still severe- if any other Neuro Sx's this weekend, please get MRI of brain.  4/5-6 stable, headache  improved, continue to monitor.  Continues to have severe dysarthria but can understand her for the most part  4/7- Pt reports daily HA's since last week- she feels it's due to blurry vision, but having daily- is 8/10 this AM- will add Topamax 50 mg at bedtime for HA prevention.    I spent a total  of 36   minutes on total care today- >50% coordination of care- due to  D/w pt and NT about her care- added meds for daily HA's- likely due to stroke.      LOS: 14 days A FACE TO FACE EVALUATION WAS PERFORMED  Jeremie Giangrande 02/23/2024, 8:31 AM

## 2024-02-23 NOTE — NC FL2 (Signed)
 [place] MEDICAID FL2 LEVEL OF CARE FORM     IDENTIFICATION  Patient Name: Theresa Watkins Birthdate: 1966-11-08 Sex: female Admission Date (Current Location): 02/09/2024  [place] and [place] Number:  Haynes Bast [id number] Facility and Address:  [facility], 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   TIA (transient ischemic attack) 02/03/2024   Hypothyroidism 02/03/2024   Prediabetes 12/12/2022   Acute CVA (cerebrovascular accident) (HCC) 11/10/2022   Acute ischemic stroke (HCC) 11/09/2022   Chondromalacia patellae, 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   Unilateral primary osteoarthritis, left hip 09/25/2021   Left foot pain 08/26/2019   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   Tobacco dependence 04/09/2019   Vitamin D deficiency 02/18/2018   Toe deformity, acquired, right 02/18/2018   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   Hyperlipemia 03/21/2017   Headache 03/21/2017   Tooth pain    Cerebral infarction (HCC) 12/27/2015   HLD (hyperlipidemia)    Hemiparesis affecting dominant side as late effect of stroke (HCC)    Tobacco abuse    Cocaine use disorder (HCC)    ETOH abuse    Postoperative hypothyroidism 12/24/2015   Drug abuse, cocaine type (HCC) 05/09/2014   Hx of thyroid cancer 01/25/2014   Essential hypertension 06/03/2012    Orientation RESPIRATION BLADDER Height & Weight     Self, Time, Situation, Place  Normal Continent Weight: 141 lb 15.6 oz (64.4  kg) Height:  5\' 6"  (167.6 cm)  BEHAVIORAL SYMPTOMS/MOOD NEUROLOGICAL BOWEL NUTRITION STATUS      Continent Diet  AMBULATORY STATUS COMMUNICATION OF NEEDS Skin   Limited Assist Verbally (pt speech impacted by stroke) Normal                       Personal Care Assistance Level of Assistance  Bathing, Feeding, Dressing Bathing Assistance: Limited assistance Feeding assistance: Limited assistance Dressing Assistance: Limited assistance     Functional Limitations Info  Sight, Hearing, Speech Sight Info: Adequate Hearing Info: Adequate Speech Info: Impaired    SPECIAL CARE FACTORS FREQUENCY  PT (By licensed PT), OT (By licensed OT), Speech therapy     PT Frequency: 5xs per week OT Frequency: 5xs per week     Speech Therapy Frequency: 5xs per week      Contractures Contractures Info: Not present    Additional Factors Info  Code Status, Allergies Code Status Info: Full Allergies Info: See discharge instructions           Current Medications (02/23/2024):  This is the current hospital active medication list Current Facility-Administered Medications  Medication Dose Route Frequency Provider Last Rate Last Admin   acetaminophen (TYLENOL) tablet 650 mg  650 mg Oral Q4H PRN Charlton Amor, PA-C   650 mg at 02/19/24 2014   Or   acetaminophen (TYLENOL) 160 MG/5ML solution 650 mg  650 mg Per Tube Q4H PRN Angiulli, Mcarthur Rossetti, PA-C       Or   acetaminophen (TYLENOL) suppository 650 mg  650 mg  Rectal Q4H PRN Charlton Amor, PA-C       aspirin EC tablet 81 mg  81 mg Oral Daily Charlton Amor, PA-C   81 mg at 02/23/24 1610   bisacodyl (DULCOLAX) suppository 10 mg  10 mg Rectal Daily PRN Jacquelynn Cree, PA-C   10 mg at 02/09/24 2249   butalbital-acetaminophen-caffeine (FIORICET) 50-325-40 MG per tablet 1 tablet  1 tablet Oral Q4H PRN Lovorn, Aundra Millet, MD   1 tablet at 02/22/24 2006   calcium citrate (CALCITRATE - dosed in mg elemental calcium) tablet 950 mg  200 mg of  elemental calcium Oral Daily Fanny Dance, MD   950 mg at 02/23/24 9604   cholecalciferol (VITAMIN D3) 25 MCG (1000 UNIT) tablet 1,000 Units  1,000 Units Oral Daily Lovorn, Megan, MD   1,000 Units at 02/23/24 0827   clopidogrel (PLAVIX) tablet 75 mg  75 mg Oral Daily Charlton Amor, PA-C   75 mg at 02/23/24 5409   diclofenac Sodium (VOLTAREN) 1 % topical gel 2 g  2 g Topical QID Elijah Birk C, DO   2 g at 02/23/24 1150   DULoxetine (CYMBALTA) DR capsule 30 mg  30 mg Oral Daily Lovorn, Megan, MD   30 mg at 02/23/24 0827   enoxaparin (LOVENOX) injection 40 mg  40 mg Subcutaneous Q24H Charlton Amor, PA-C   40 mg at 02/23/24 0827   levothyroxine (SYNTHROID) tablet 100 mcg  100 mcg Oral [id number] Charlton Amor, PA-C   100 mcg at 02/23/24 4782   nystatin (MYCOSTATIN) 100000 UNIT/ML suspension 500,000 Units  5 mL Oral QID Charlton Amor, PA-C   500,000 Units at 02/22/24 2006   polyethylene glycol (MIRALAX / GLYCOLAX) packet 17 g  17 g Oral Daily Charlton Amor, PA-C   17 g at 02/23/24 9562   rosuvastatin (CRESTOR) tablet 40 mg  40 mg Oral Daily AngiulliMcarthur Rossetti, PA-C   40 mg at 02/23/24 0827   senna-docusate (Senokot-S) tablet 2 tablet  2 tablet Oral BID Lovorn, Aundra Millet, MD   2 tablet at 02/23/24 1308   sodium phosphate (FLEET) enema 1 enema  1 enema Rectal Daily PRN Love, Pamela S, PA-C       topiramate (TOPAMAX) tablet 50 mg  50 mg Oral QHS Lovorn, Aundra Millet, MD         Discharge Medications: Please see discharge summary for a list of discharge medications.  Relevant Imaging Results:  Relevant Lab Results:   Additional Information [id number]  Gretchen Short, LCSW

## 2024-02-23 NOTE — Progress Notes (Addendum)
 Speech Language Pathology Daily Session Note  Patient Details  Name: Theresa Watkins MRN: [id number] Date of Birth: 02-06-1966  Today's Date: 02/23/2024 SLP Individual Time: 0900-1002; 9147-8295A SLP Individual Time Calculation (min): 62 min; 46 min  Short Term Goals: Week 2: SLP Short Term Goal 1 (Week 2): Patient will consume least restrictive diet with use of compensatory strategies given supervision multimodal A SLP Short Term Goal 2 (Week 2): Patient will increase speech intelligibility to 80% at the sentence level given min multimodal A  Skilled Therapeutic Interventions:  Session 1: Patient was seen in am to address dysphagia management and speech intelligibility. Pt was easily alerted upon SLP arrival and agreeable for session. Pt encouraged to complete oral care hygiene with pt able to use suction toothbrush to complete thorough oral hygiene. SLP assisted with hygiene through cleaning of dentures. Utilizing EMST device pt completed 5 sets of 5 at 35cm H2O in order to improve airway protection and respiratory support for speech and swallowing. Pt warranting sup A cues during completion. She was subsequently challenged in use of IOPI for lingual strengthening and coordination. She was subsequently instructed in completion of  IOPI set at 15 kPa posteriorly and 25 kPA anteriorly warranting min A for completion of 1 set of 10. Session then transitioned to focus on speech with pt guided in structured speech task. Pt able to recall speech intelligibility strategies with sup A. She answered biographical and personal questions ranging from words to sentence level warranting min cues for breath support with pt able to improved vocal quality as indicated by less strain and greater articulatory precision. With min A she maintained ~80% intelligibility at the sentence level. Pt left at bedside with call button within reach and bed alarm active. SLP to continue POC.   Session 2: Patient was seen in PM  for family education session. Pt alert and being assisted from bathroom by NT upon SLP arrival. Both pt's son and mother present for education. SLP introduced role of SLP and rationale including swallowing and speech function. Education provided on current diet recommendations, recommended supervision levels, s/s aspiration, swallowing strategies and current POC including use of EMST and IOPI for strengthening and airway protection. SLP further discussed speech strategies with pt able to communicate SLOP intelligibility strategies with min A. SLP continued education through discussing energy conservation methods, vocal rest, and anatomy as it relates to speech mechanisms. Both pt's son and mother were active participants in session c/b asking questions and providing insight as appropriate. Direct handoff to OT at conclusion of session. SLP to continue POC.   Pain Pain Assessment Pain Scale: 0-10 Pain Score: 0-No pain  Therapy/Group: Individual Therapy  [name] 02/23/2024, 12:46 PM

## 2024-02-23 NOTE — Progress Notes (Signed)
 Physical Therapy Session Note  Patient Details  Name: Theresa Watkins MRN: [id number] Date of Birth: 10/23/1966  Today's Date: 02/23/2024 PT Individual Time: 1300-1407 PT Individual Time Calculation (min): 67 min   Short Term Goals: Week 2:  PT Short Term Goal 1 (Week 2): pt will ambulate 100 ft with assistance PT Short Term Goal 2 (Week 2): Pt will perform bed/chair transfer with CGA consistently PT Short Term Goal 3 (Week 2): Pt will initiate stair training  Skilled Therapeutic Interventions/Progress Updates:    pt received in bed and agreeable to therapy. Pt requesting shower on arrival d/t soaked brief. Pt eventually willing to quick wash up and brief change. Used stedy for dependent brief change, pt able to independently wash in stedy perch.   Family present for family education. Demoed versions of bed<>chair transfer using both HHA and bedrail. Discussed that they are unable to provide supervision or assist at home. Provided therapy recommendation of some level of assist being necessary for safety at home. Family members expressed understanding, and are willing to pursue different options. Answered questions about SNF placement vs. ALF as able and passed message to CSW.   Pt propelled w/c with BLE x 100 ft to demo ability to self propel, transported remaining distance for time.   Pt performed car transfer x 2 with therapist and son, requiring min a to guide hips into seat for squat pivot transfer. No assist for LE management. Family members then educated on bumping up/down threshold for home entrance. Discussed that we will work on small stairs, but w/c may be easiest option since someone will be with her for appointments.   Discussed moving in the home, and demonstrated gait x 40 ft at hall rail. Mod a for dynamic stability and balance. Pt was able to advance LLE and no assist was needed for knee block, but LLE did maintain knee flexion which worsened with fatigue. Family members  expressed greater understanding of pt's deficits.   Returned to room and pt handed off to NT for toileting at end of session.   Therapy Documentation Precautions:  Precautions Precautions: Fall Precaution/Restrictions Comments: L hemi Restrictions Weight Bearing Restrictions Per Provider Order: No General:      Therapy/Group: Individual Therapy  Juluis Rainier 02/23/2024, 4:00 PM

## 2024-02-23 NOTE — Progress Notes (Signed)
 Occupational Therapy Session Note  Patient Details  Name: Theresa Watkins MRN: [id number] Date of Birth: 08-28-1966  Today's Date: 02/23/2024 OT Individual Time: 1450-1530 OT Individual Time Calculation (min): 40 min    Short Term Goals: Week 2:  OT Short Term Goal 1 (Week 2): Pt will complete PROM with LUE without VC OT Short Term Goal 2 (Week 2): Pt will complete shower transfer at Mod A with LRAD OT Short Term Goal 3 (Week 2): Pt will complete toilet transfers with mod A OT Short Term Goal 4 (Week 2): Pt will complete toileting tasks with mod A OT Short Term Goal 5 (Week 2): Pt will complete LB dressing tasks with mod A  Skilled Therapeutic Interventions/Progress Updates:  Skilled OT intervention completed with focus on family education with son and mother present. Pt received upright in bed with direct care handoff from SLP, agreeable to session. No pain reported.  Per PT and also son/mother, they currently have concerns about ability to provide 24/7 supervision. CSW aware and did arrive during session however family preferring to have conversation with CSW while not in front of pt. Family agreeable to education session in case pt does not get approved for SNF.  Pt with severe dysarthria. Pt transitioned to EOB with + time however overall supervision using bed rails. Poor awareness of LUE as when OT advised positioning LUE in safe place in prep for bed mobility, pt declined but proceeded to sit on bent wrist. Per son, pt's bathroom and bedroom are not accessible via w/c and hallways are very narrow and also not accessible, therefore unless ambulatory, pt will need to set up sleeping space and toileting in living room. At current level, recommendation would not be for son to ambulate pt due to LLE deficits and impulsivity challenges. Advised that drop arm BSC would be ideal for squat pivot transfers from couch (pt reports preferring to sleep here) or hospital bed.   Pt required multimodal  cues for sequencing, however was able to complete mini squat pivots > R side > DABSC. Pt with poor tolerance of sitting on BSC however due to under thighs and bars of commode. Advised that pt wouldn't be able to be left unattended on commode. Min/mod A stand pivot > EOB to L side, with poor LLE awareness and positioning. Discussed how this could change if given a grab bar next to toilet however even her bathroom set up home does not accommodate this option. Pt with poor activity tolerance, I.e. frequently lying her head/self on foot rail, with frequent request to return to bed. Offered to transfer pt to w/c for bed linen change however pt declined. Pt stood with Min A using L HHA, however pt required bilateral HHA (+2 from son) for side stepping > L > HOB with cues for LLE positioning. Min A for sit > supine, mod I for rolling > R side of bed with pt turning back to OT.  Pt remained in R side lying, with bed alarm on/activated, and with all needs in reach at end of session.    Therapy Documentation Precautions:  Precautions Precautions: Fall Precaution/Restrictions Comments: L hemi Restrictions Weight Bearing Restrictions Per Provider Order: No    Therapy/Group: Individual Therapy  Melvyn Novas, MS, OTR/L  02/23/2024, 3:42 PM

## 2024-02-23 NOTE — Progress Notes (Signed)
 Patient ID: Theresa Watkins, female   DOB: [birth year], 58 y.o.   MRN: [id number]  SW sent out tentative SNF referral to see if any possible options, based on reports from medical team that pt may be possible SNF based on family concerns.   Cecile Sheerer, MSW, LCSW Office: [phone] Cell: [phone] Fax: [phone]

## 2024-02-23 NOTE — Plan of Care (Signed)
  Problem: RH BOWEL ELIMINATION Goal: RH STG MANAGE BOWEL WITH ASSISTANCE Description: STG Manage Bowel with toileting Assistance. Outcome: Progressing   Problem: RH SAFETY Goal: RH STG ADHERE TO SAFETY PRECAUTIONS W/ASSISTANCE/DEVICE Description: STG Adhere to Safety Precautions With cues Assistance/Device. Outcome: Progressing   Problem: RH PAIN MANAGEMENT Goal: RH STG PAIN MANAGED AT OR BELOW PT'S PAIN GOAL Description: Pain managed < 4 with prns Outcome: Progressing   Problem: RH KNOWLEDGE DEFICIT Goal: RH STG INCREASE KNOWLEDGE OF HYPERTENSION Description: Patient and mother will be able to manage HTN using educational resources for medications and dietary modification independently Outcome: Progressing

## 2024-02-23 NOTE — Plan of Care (Signed)
  Problem: RH BOWEL ELIMINATION Goal: RH STG MANAGE BOWEL WITH ASSISTANCE Description: STG Manage Bowel with toileting Assistance. Outcome: Progressing Goal: RH STG MANAGE BOWEL W/MEDICATION W/ASSISTANCE Description: STG Manage Bowel with Medication with mod I Assistance. Outcome: Progressing   Problem: RH SAFETY Goal: RH STG ADHERE TO SAFETY PRECAUTIONS W/ASSISTANCE/DEVICE Description: STG Adhere to Safety Precautions With cues Assistance/Device. Outcome: Progressing   Problem: RH PAIN MANAGEMENT Goal: RH STG PAIN MANAGED AT OR BELOW PT'S PAIN GOAL Description: Pain managed < 4 with prns Outcome: Progressing   Problem: RH KNOWLEDGE DEFICIT Goal: RH STG INCREASE KNOWLEDGE OF HYPERTENSION Description: Patient and mother will be able to manage HTN using educational resources for medications and dietary modification independently Outcome: Progressing

## 2024-02-24 DIAGNOSIS — I63511 Cerebral infarction due to unspecified occlusion or stenosis of right middle cerebral artery: Secondary | ICD-10-CM | POA: Diagnosis not present

## 2024-02-24 MED ORDER — DULOXETINE HCL 60 MG PO CPEP
60.0000 mg | ORAL_CAPSULE | Freq: Every day | ORAL | Status: DC
  Administered 2024-02-25 – 2024-03-23 (×28): 60 mg via ORAL
  Filled 2024-02-24 (×29): qty 1

## 2024-02-24 NOTE — Progress Notes (Signed)
 Occupational Therapy Session Note  Patient Details  Name: BRAYDEN BETTERS MRN: [id number] Date of Birth: 11/21/1965  Today's Date: 02/24/2024 OT Individual Time: 9147-8295 OT Individual Time Calculation (min): 27 min  and Today's Date: 02/24/2024 OT Missed Time: 18 Minutes Missed Time Reason: Patient fatigue   Short Term Goals: Week 2:  OT Short Term Goal 1 (Week 2): Pt will complete PROM with LUE without VC OT Short Term Goal 2 (Week 2): Pt will complete shower transfer at Mod A with LRAD OT Short Term Goal 3 (Week 2): Pt will complete toilet transfers with mod A OT Short Term Goal 4 (Week 2): Pt will complete toileting tasks with mod A OT Short Term Goal 5 (Week 2): Pt will complete LB dressing tasks with mod A  Skilled Therapeutic Interventions/Progress Updates:     Pt received lightly sleeping in bed, waking upon OT arrival. Pt presenting to be fatigued, however in good spirits receptive to skilled OT session reporting 0/10 pain- OT offering intermittent rest breaks, repositioning, and therapeutic support to optimize participation in therapy session. Pt reporting she was feeling sleepy following receiving medications prior to session, however was motivated to participate in session. Focused this session on L UE NMRE and Pt education. Pt transitioned to EOB using bed features with supervision +increased time and min verbal cues for techniques. Sitting EOB, engaged Pt in L UE NMRE exercises with L UE positioned on UE ranger to decrease effects of gravity and facilitate improved natural movement patterns. Pt completed 1x8 reps of isolated elbow flexion/extension, scapular protraction/retraction, and shoulder circumduction clockwise/counter clockwise with OT providing min-mod A and max verbal/tactile cues for technique and muscle activation. Pt reporting increased sleepiness and requesting to return to bed. EOB>supine supervision +min verbal cues for technique. Pt able to utilize R UE positioned  on head board bed rail to bring self to [facility]. Pt requesting to lie on her L (hemiparetic) side to take a nap. Provided education on sleep positioning and reinforced importance of maintaining L UE in safe position to avoid injury with Pt receptive to education. Assisted Pt into L side lying position with L shoulder protracted, L arm extended out to the side, and R leg supported on pillow- Pt reporting increased comfort in this position. Missed 18 minutes of skilled OT treatment d/t fatigue. Will attempt to make up time as Pt's status and schedule allow. Pt was left resting in bed with call bell in reach, bed alarm on, and all needs met.    Therapy Documentation Precautions:  Precautions Precautions: Fall Precaution/Restrictions Comments: L hemi Restrictions Weight Bearing Restrictions Per Provider Order: No  Therapy/Group: Individual Therapy  Clide Deutscher 02/24/2024, 1:31 PM

## 2024-02-24 NOTE — Progress Notes (Signed)
 Occupational Therapy Session Note  Patient Details  Name: Theresa Watkins MRN: [id number] Date of Birth: [birth year]  Today's Date: 02/24/2024 OT Individual Time: 0700-0800 OT Individual Time Calculation (min): 60 min  and Today's Date: 02/24/2024 OT Missed Time: 15 Minutes Missed Time Reason: Patient fatigue   Short Term Goals: Week 2:  OT Short Term Goal 1 (Week 2): Pt will complete PROM with LUE without VC OT Short Term Goal 2 (Week 2): Pt will complete shower transfer at Mod A with LRAD OT Short Term Goal 3 (Week 2): Pt will complete toilet transfers with mod A OT Short Term Goal 4 (Week 2): Pt will complete toileting tasks with mod A OT Short Term Goal 5 (Week 2): Pt will complete LB dressing tasks with mod A  Skilled Therapeutic Interventions/Progress Updates:    Pt resting in bed upon arrival and requested to eat breakfast. Initial focus on repositionig in bed with min A. Skilled OT intervention with focus on self feeding, adherence to swallowing precautions, and safety awareness to increase independence with BADLs. All distractions minimized before eating. Pt required assist for setup. Supervision for self feeding with min verbal cues for portion mgmt. MD entered room and initiated discussion about discharge planning and need for physical assistance 24/7. Pt became upset and declined to finish breakfast, get dressed, or continue with therapy. Emotional support provided. Pt missed 15 mins skilled OT services. Will attempt to make up as schedule allows.   Therapy Documentation Precautions:  Precautions Precautions: Fall Precaution/Restrictions Comments: L hemi Restrictions Weight Bearing Restrictions Per Provider Order: No General: General OT Amount of Missed Time: 15 Minutes Pain:  Pt denies pain this morning   Therapy/Group: Individual Therapy  Rich Brave 02/24/2024, 8:01 AM

## 2024-02-24 NOTE — Progress Notes (Signed)
 PROGRESS NOTE   Subjective/Complaints:  Pt reports  more down, tired today- and then got upset when I asked her about family asking about SNF- she was very clear she does NOT want to go to SNF-  We did discuss that she will need physical assistance when she goes home. And that she has been having some need to slow down when works with therapy/nursing.   ROS:   Pt denies SOB, abd pain, CP, N/V/C/D, and vision changes Feeling more depressed  Objective:   No results found.   No results for input(s): "WBC", "HGB", "HCT", "PLT" in the last 72 hours.   No results for input(s): "NA", "K", "CL", "CO2", "GLUCOSE", "BUN", "CREATININE", "CALCIUM" in the last 72 hours.    Intake/Output Summary (Last 24 hours) at 02/24/2024 0817 Last data filed at 02/23/2024 2130 Gross per 24 hour  Intake 400 ml  Output 1 ml  Net 399 ml         Physical Exam: Vital Signs Blood pressure 116/67, pulse 60, temperature 98.1 F (36.7 C), temperature source Oral, resp. rate 16, height 5\' 6"  (1.676 m), weight 64.4 kg, SpO2 96%.           General: awake, alert, appropriate, sitting up in bed- didn't eat much so far; OT in room; NAD HENT: conjugate gaze; oropharynx moist CV: regular rate and rhythm; no JVD Pulmonary: CTA B/L; no W/R/R- good air movement GI: soft, NT, ND, (+)BS Psychiatric: appropriate- got agitated, but pt was able to name this after we discussed she doesn't want to go to SNF;  Neurological:   Moderately severe dysarthria- slightly better- like when she first came in  Strength exam- LUE ~3/5- is better  LLE 3+ to 4-/5    Neurologic: Cranial nerves II through XII intact, motor strength is 5/5 in right , 2-/5 left  deltoid, bicep, tricep, grip, 5/5 Right, 3/5 left hip flexor, knee extensors, 5/5 RIght , 2- left ankle dorsiflexor and plantar flexor  Sensory exam normal sensation to light touch in right upper and lower  extremities, feels pinch LLE , no sensation to pinch in left fingers Cerebellar exam normal finger to nose to finger RIght , unable to perform on left due to weakness Musculoskeletal:  + Pain and limited ROM in L hip internal >> extenal rotation; no deformity or crepitus.  FABER, FADIR with groin pain.   Unchanged 4/6  Assessment/Plan: 1. Functional deficits which require 3+ hours per day of interdisciplinary therapy in a comprehensive inpatient rehab setting. Physiatrist is providing close team supervision and 24 hour management of active medical problems listed below. Physiatrist and rehab team continue to assess barriers to discharge/monitor patient progress toward functional and medical goals  Care Tool:  Bathing    Body parts bathed by patient: Face, Left arm, Chest, Abdomen, Front perineal area, Right upper leg, Left upper leg, Right lower leg   Body parts bathed by helper: Right arm, Buttocks, Left lower leg     Bathing assist Assist Level: Moderate Assistance - Patient 50 - 74%     Upper Body Dressing/Undressing Upper body dressing   What is the patient wearing?: Pull over shirt  Upper body assist Assist Level: Moderate Assistance - Patient 50 - 74%    Lower Body Dressing/Undressing Lower body dressing      What is the patient wearing?: Underwear/pull up, Pants     Lower body assist Assist for lower body dressing: Maximal Assistance - Patient 25 - 49%     Toileting Toileting    Toileting assist Assist for toileting: Total Assistance - Patient < 25%     Transfers Chair/bed transfer  Transfers assist     Chair/bed transfer assist level: Moderate Assistance - Patient 50 - 74%     Locomotion Ambulation   Ambulation assist      Assist level: 2 helpers (max a +w/c follow) Assistive device: Other (comment) (R hand rail) Max distance: 10   Walk 10 feet activity   Assist     Assist level: 2 helpers (max a +w/c follow) Assistive device: Other  (comment) (R hand rail)   Walk 50 feet activity   Assist Walk 50 feet with 2 turns activity did not occur: Safety/medical concerns (L hemiplegia and pt fatigue)         Walk 150 feet activity   Assist Walk 150 feet activity did not occur: Safety/medical concerns (L hemiplegia and pt fatigue)         Walk 10 feet on uneven surface  activity   Assist Walk 10 feet on uneven surfaces activity did not occur: Safety/medical concerns (L hemiplegia and pt fatigue)         Wheelchair     Assist Is the patient using a wheelchair?: Yes Type of Wheelchair: Manual    Wheelchair assist level: Supervision/Verbal cueing, Minimal Assistance - Patient > 75% Max wheelchair distance: 150    Wheelchair 50 feet with 2 turns activity    Assist        Assist Level: Supervision/Verbal cueing   Wheelchair 150 feet activity     Assist      Assist Level: Minimal Assistance - Patient > 75%   Blood pressure 116/67, pulse 60, temperature 98.1 F (36.7 C), temperature source Oral, resp. rate 16, height 5\' 6"  (1.676 m), weight 64.4 kg, SpO2 96%.   Medical Problem List and Plan: 1. 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  2.  Antithrombotics: -DVT/anticoagulation:  Pharmaceutical: Lovenox             -antiplatelet therapy: Aspirin 81 mg daily and Plavix 75 mg daily x 3 weeks then Plavix alone 3. Pain Management: Tylenol as needed  3/25- denies pain- con't regimen rpn   3-30: Ongoing left hip pain; Voltaren gel and Tylenol.  3/31- will  start Vit D and Ca per pt request- has helped hip pain in past?  4/1- pt insistent was very helpful already for hip pain  4/2- pt wants at the same time- per chart, Ca and Vit D come at same time  4/3- having new burning pain in LUE- which is seen  sometimes with stroke- will add Duloxetine 30 mg daily for nerve pain- monitor for side effects.  -if gets nausea, will change to Lyrica 4/4-6 no side effects so far-  4/8- will increase Duloxetine to 60 mg daily  4. Mood/Behavior/Sleep: Provide emotional support             -antipsychotic agents: N/A 5. Neuropsych/cognition: This patient is capable of making decisions on her own behalf. 6. Skin/Wound Care: Routine skin checks 7. Fluids/Electrolytes/Nutrition: Routine and outs with follow-up chemistries 8.  Dysphagia.  Dysphagia #2 thin liquids.  Follow-up speech therapy  3/28- no change in diet so far  3/31- Pt doesn't need provale cup- will let staff know  4/1- d/w SLP- can drink normally  4.2- pt wants ot upgrade diet- explained she's not ready and could get pneumonia if update too early  4/4.  She was started on D3 thin diet yesterday, continue to monitor  4/6  Eating 50-100% of meals 9.  Hyperlipidemia.  Crestor 10.  Recent History of cocaine/tobacco use.  UDS positive for cocaine.  Provide counseling, this has been a chronic issue , seen in PCP notes 11.  Hypothyroidism.  Synthroid 12.  Constipation.  MiraLAX daily, Senokot-S 1 tab twice daily   3/25- LBM last night after 10 days with no BM  4/7- LBM yesterday 13. Dysarthria- hard to understand- SLP ordered  4/3- Was worse this AM- will monitor for progress  4/4- slightly better today- can understand after 1-3 repetitions- low threshold to recheck Brain MRI  4/6 appears stable/vs a little improved  based on description from prior days  4/7- still somewhat hard to understand but getting back to where it was when came in.  14. Polycythemia  3/25- Hb 16.1- and BUN up to 18, so is a little  dry- BUN was 9 prior.   3/26- will recheck labs Thursday  3/27- Back to 14.9 after drinking more  3/31- will check labs today 15. Blurry vision  3/27- will need Ophtho after d/c.   3-29: Seems to mostly be from the left eye, with difficulty converging and tracking; messaged OT and PT to trial taping versus alternating eye patch to help with convergence.  3-30: Reporting improvement with use of taping  4/4- pt reports everything blurry- cannot see faces as a result- never had eye checked in past- used readers but cannot see at a distance since stroke- will send to Ophtho after d/c.   4/7- d/w pt again- she wants eyeglasses- explained we cannot get when in hospital- also per guidelines, since vision can still change, to not get for 3 months after stroke. She disbelieves it's from stroke, since had 2 prior- that didn't cause vision changes- we educated pt pn how this can occur.  16.  Left hip pain.  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 C/O feeling dry  4/1- Labs look good- BUN 15 and Cr 0.69 18. Cough  4/1- didn't hear the cough- sounds clear- if continues, will treat symptomatically.   4/2- pt feels like "coming down with something"- because her back is sore and was "freezing" last night- is 69 degrees in room 19. Severe HA/increased dysarthria  4/3- pt had severe HA this AM as well as increased dysarthria/difficulty speaking- no additional Neuro Sx's, but hadn't been having HA's-ordered  STAT CT because my concern for possible new CVA/extension of stroke- Head CT (-)- Sx's are improving and given Fioricet as needed for HA- if continues today, will get MRI, but head CT shows no new CVA. 4/4- HA mild this AM- have a low threshold for doing MRI of brain since dysarthria slightly improved, but still severe- if any  other Neuro Sx's this weekend, please get MRI of brain.  4/5-6 stable, headache improved, continue to monitor.  Continues to have severe dysarthria but can understand her for the most part  4/7- Pt reports daily HA's since last week- she feels it's due to blurry vision, but having daily- is 8/10 this AM- will add Topamax 50 mg at bedtime for HA prevention.  20. Decreased safety awareness/impulsiveness  4/8- d/w pt as well as mother that pt would benefit from slowing down when she attempts function in therapy and with nursing    I spent a total of 58   minutes on total care today- >50% coordination of care- due to  Team conference- pt seen x2- also called pt's mother to discuss prognosis as well as her concerns that pt knows she is just asking questions on care after hospital as compared to deciding to send to SNF.     LOS: 15 days A FACE TO FACE EVALUATION WAS PERFORMED  Zeeshan Korte 02/24/2024, 8:17 AM

## 2024-02-24 NOTE — Progress Notes (Signed)
 Physical Therapy Session Note  Patient Details  Name: Theresa Watkins MRN: [id number] Date of Birth: August 09, 1966  Today's Date: 02/24/2024 PT Individual Time: 0830-0857 PT Individual Time Calculation (min): 27 min   Short Term Goals: Week 1:  PT Short Term Goal 1 (Week 1): Pt will perform squat pivot tx with CGA bed<>chair PT Short Term Goal 1 - Progress (Week 1): Met PT Short Term Goal 2 (Week 1): Pt will ambulate 13' with maxA and LRAD PT Short Term Goal 2 - Progress (Week 1): Met PT Short Term Goal 3 (Week 1): Pt will propel w/c 150' with SBA PT Short Term Goal 3 - Progress (Week 1): Met Week 2:  PT Short Term Goal 1 (Week 2): pt will ambulate 100 ft with assistance PT Short Term Goal 2 (Week 2): Pt will perform bed/chair transfer with CGA consistently PT Short Term Goal 3 (Week 2): Pt will initiate stair training  Skilled Therapeutic Interventions/Progress Updates:   Received pt semi-reclined in bed on phone. Pt agreeable to PT treatment despite having rough morning and denied any pain during session. Session with emphasis on functional mobility/transfers, dressing, generalized strengthening and endurance, and dynamic standing balance/coordination. Pt reported being wet - removed soiled brief and donned clean brief and pants in supine with max A for time management purposes. Pt transferred supine<>sitting L EOB from flat bed without rails mod A for trunk control. Donned shoes, doffed dirty shirt, and donned clean one with max A for time management purposes. Stood from EOB without AD and light min A and required max A to pull pants over hips. Performed squat<>pivot bed<>WC to R with light mod A and RN arrived to administer medications. Sat in WC at sink and washed face with set up assist. Pt requested to "go for a ride" this morning - transported around unit in [facility] dependently due to time restrictions and pt appeared in better spirits waving and smiling at staff. Concluded session with pt  sitting in WC, needs within reach, and seatbelt alarm on. RN at bedside attending to care.   Therapy Documentation Precautions:  Precautions Precautions: Fall Precaution/Restrictions Comments: L hemi Restrictions Weight Bearing Restrictions Per Provider Order: No  Therapy/Group: Individual Therapy Marlana Salvage Zaunegger Blima Rich PT, DPT 02/24/2024, 7:07 AM

## 2024-02-24 NOTE — Progress Notes (Signed)
 Occupational Therapy Weekly Progress Note  Patient Details  Name: Theresa Watkins MRN: [id number] Date of Birth: 22-Feb-1966  Beginning of progress report period: February 16, 2024 End of progress report period: February 24, 2024    Patient has met 3 of 5 short term goals.  Pt is making steady progress with BADLs and funcitonal transfers. Pt requires mod A for bathing at shower level and UB dressing. Pt currnelty requires max A for LB dressing tasks but is able to thread BLE into pants. Transfers with mod A for squat pivot. Pt currently requires tot A for LUE use as stabilizer. Pt requires mod verbal cues for hemi dressing techniques and safety awareness during transitional movements. Family has been present for therapy education.   Patient continues to demonstrate the following deficits: {impairments:3041632} and therefore will continue to benefit from skilled OT intervention to enhance overall performance with {ADL/iADL:3041649}.  Patient {LTG progression:3041653}.  {plan of NWGN:5621308}  OT Short Term Goals Week 2:  OT Short Term Goal 1 (Week 2): Pt will complete PROM with LUE without VC OT Short Term Goal 1 - Progress (Week 2): Met OT Short Term Goal 2 (Week 2): Pt will complete shower transfer at Mod A with LRAD OT Short Term Goal 2 - Progress (Week 2): Met OT Short Term Goal 3 (Week 2): Pt will complete toilet transfers with mod A OT Short Term Goal 3 - Progress (Week 2): Met OT Short Term Goal 4 (Week 2): Pt will complete toileting tasks with mod A OT Short Term Goal 4 - Progress (Week 2): Progressing toward goal OT Short Term Goal 5 (Week 2): Pt will complete LB dressing tasks with mod A OT Short Term Goal 5 - Progress (Week 2): Progressing toward goal Week 3:  OT Short Term Goal 1 (Week 3): Pt will complete LB dressing tasks with mod A OT Short Term Goal 2 (Week 3): Pt will complete LB dressing tasks with mod A OT Short Term Goal 3 (Week 3): Pt will utilize LUE as gross assist during  funcitonal tasks with mod A   Rich Brave 02/24/2024, 1:57 PM

## 2024-02-24 NOTE — Progress Notes (Signed)
 Speech Language Pathology Daily Session Note  Patient Details  Name: Theresa Watkins MRN: [id number] Date of Birth: Sep 24, 1966  Today's Date: 02/24/2024 SLP Individual Time: 1450-1500 SLP Individual Time Calculation (min): 10 min and Today's Date: 02/24/2024 SLP Missed Time: 20 Minutes Missed Time Reason: Toileting  Short Term Goals: Week 2: SLP Short Term Goal 1 (Week 2): Patient will consume least restrictive diet with use of compensatory strategies given supervision multimodal A SLP Short Term Goal 1 - Progress (Week 2): Not met SLP Short Term Goal 2 (Week 2): Patient will increase speech intelligibility to 80% at the sentence level given min multimodal A SLP Short Term Goal 2 - Progress (Week 2): Met SLP Short Term Goal 4 (Week 2): Patient will consume least restrictive diet with use of compensatory strategies given supervision multimodal A SLP Short Term Goal 5 (Week 2): Patient will increase speech intelligibility to 90% at the sentence level given min multimodal A  Skilled Therapeutic Interventions:   Initial 20 mins of tx session missed d/t BM. Once finished, pt greeted in the bathroom and SLP assisted w/ remainder of toileting. She benefited from [facility] for completion of toileting d/t donning of pants/brief. During toileting and transfer back to bed via sara steady, she was able to provide 1-2 step directions for SLP. She benefited from minA verbal cues to utilize easy onset of phonation and pausing as needed during sentence level responses. Unable to complete additional tasks d/t time constraints. She was left in bed with the alarm set and call light within reach. Recommend cont ST.   Pain No pain reported.   Therapy/Group: Individual Therapy  Pati Gallo 02/24/2024, 4:22 PM

## 2024-02-24 NOTE — Progress Notes (Signed)
 Patient ID: Theresa Watkins, female   DOB: 05-18-1966, 58 y.o.   MRN: [id number]  [phone]- SW followed up with pt mother after family edu, and reports she would like to talke from yesterday. Reports that she was seeking other supports that will be available to pt at discharge. SW informed on PCS referral (aide) that has been sent to insurance, however, she will not be seen until she is discharged and has to be approved by insurance. SW shared otherwise, any additional supports would be Programmer, systems. Sw will leave sitter list in room. She also asks about an extension due to a funeral on 4/22 and will not return until 4/24. SW shared will follow-up once there is more information.   Cecile Sheerer, MSW, LCSW Office: [phone] Cell: [phone] Fax: [phone]

## 2024-02-24 NOTE — Progress Notes (Signed)
 Speech Language Pathology Weekly Progress and Session Note  Patient Details  Name: STUTI SANDIN MRN: [id number] Date of Birth: February 14, 1966  Beginning of progress report period: February 17, 2024 End of progress report period: February 24, 2024  Today's Date: 02/24/2024 SLP Individual Time: 4098-1191 SLP Individual Time Calculation (min): 59 min  Short Term Goals: Week 2: SLP Short Term Goal 1 (Week 2): Patient will consume least restrictive diet with use of compensatory strategies given supervision multimodal A SLP Short Term Goal 1 - Progress (Week 2): Not met SLP Short Term Goal 2 (Week 2): Patient will increase speech intelligibility to 80% at the sentence level given min multimodal A SLP Short Term Goal 2 - Progress (Week 2): Met SLP Short Term Goal 4 (Week 2): Patient will consume least restrictive diet with use of compensatory strategies given supervision multimodal A SLP Short Term Goal 5 (Week 2): Patient will increase speech intelligibility to 90% at the sentence level given min multimodal A    New Short Term Goals: Week 3:    Weekly Progress Updates: Patient made fair gains and has met 1 of 2 STG's this reporting period due to improved speech intelligibility. Currently, pt continues to require min to mod A during meals for adequate use of safe swallowing strategies with current Dys 3 and thin liquid diet. She has improved to 80% intelligibility in known context at the sentence level. Pt/ family education ongoing. Pt would benefit from continued skilled SLP intervention to maximize swallowing and speech in order to maximize her functional independence prior to discharge.   Intensity: Minumum of 1-2 x/day, 30 to 90 minutes Frequency: 3 to 5 out of 7 days Duration/Length of Stay: 4/22 Treatment/Interventions: Multimodal communication approach;Speech/Language facilitation;Functional tasks;Therapeutic Activities;Therapeutic Exercise;Internal/external aids;Dysphagia/aspiration precaution  training;Patient/family education   Daily Session  Skilled Therapeutic Interventions: Patient was seen in am to address speech intelligibility and dysphagia management. Pt was alert and seated upright in WC upon SLP arrival. Noted to be tearful and able to express news that family may be considering SNF placement. SLP provided active listening and encouragement as beneficial with pt able to redirect to more structured task given additional time. Pt reported completing oral hygiene prior to session. Pt completed 5 sets of 5 respiratory exercises utilizing EMST increased to 40cm H2O with sup A. SLP presenting IOPI next for lingual strengthening with pt initially hesitant to participate but agreeable with encouragement. Attempted to increase intensity to 20 kPa with pt expressing it was too difficult. Intensity decreased back to 15 kPA with pt able to complete 1 set of 10 posteriorly. She also completed 1 set of 10 anteriorly increased to 27kPA. In other minutes of session SLP challenged pt in speech intelligibility given multisyllabic words. Pt noted to have communication breakdown with 3 syllabic words. Pt warranting min A for respiratory support with improved ability to produce 3 syllable words. SLP also challenged pt in production of spontaneous speech at the sentence level with pt able to maintain 80% intelligibility. Direct handoff to NT at conclusion of session for toileting needs. SLP to continue POC.   General    Pain Pain Assessment Pain Scale: 0-10 Pain Score: 6  Pain Type: Acute pain Pain Location: Neck Pain Orientation: Right Pain Descriptors / Indicators: Aching  Therapy/Group: Individual Therapy  [name] 02/24/2024, 3:25 PM

## 2024-02-24 NOTE — Patient Care Conference (Signed)
 Inpatient RehabilitationTeam Conference and Plan of Care Update Date: 02/24/2024   Time: 1106 am    Patient Name: Theresa Watkins      Medical Record Number: [id number]  Date of Birth: Mar 19, 1966 Sex: Female         Room/Bed: 4W05C/4W05C-01 Payor Info: Payor: [place] MEDICAID PREPAID HEALTH PLAN / Plan: [place] MEDICAID HEALTHY BLUE / Product Type: *No Product type* /    Admit Date/Time:  02/09/2024  6:29 PM  Primary Diagnosis:  Right middle cerebral artery stroke [facility])  Hospital Problems: Principal Problem:   Right middle cerebral artery stroke [facility])    Expected Discharge Date: Expected Discharge Date: 03/12/24  Team Members Present: Physician leading conference: Dr. Genice Rouge Social Worker Present: Cecile Sheerer, LCSWA Nurse Present: Konrad Dolores, RN PT Present: Truitt Leep, PT OT Present: Ardis Rowan, COTA;Jennifer Katrinka Blazing, OT SLP Present: Pablo Lawrence, SLP PPS Coordinator present : Fae Pippin, SLP     Current Status/Progress Goal Weekly Team Focus  Bowel/Bladder   Patient is incontinent of bowel and bladder   Parient will regain continence   Will assess q shift and PRN    Swallow/Nutrition/ Hydration   Dys 3 and thin FULL supervision   min A  carryover of compensatory strategies, ensure tolerance of D3, IOPI, and EMST    ADL's   bathing-miod A; UB dressing-min A; LB dressing-max A; squat pivot tranfsers min A with max verbal cues for safety; active LT shoulder with weak grasp   Min A-SBA   BADLs, LUE NMR, tranfsers, educaiton    Mobility   supervision bed mobility. min a to CGA bed/chair transfers but pt continues to demo impulsivity with transfers requiring assist. w/c with supervision. gait up to 40 ft with mod a and hand rail, needs w/c follow, family ed was initiated but pt's son is requesting follow up closer to d/c   Supervision to min a overall  gait, transfers.    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 Patient has headache, depression, nerve pain :medication adjusted by MD. Patient limited by left hemiparesis, left upper extremity sensory deficit , blurry vision, dysarthria, apraxia and  poor safety awareness.   Patient on target to meet rehab goals: yes, Patient requires minimal assistance with upper body care and max assist with lower body care. Patient requires min assist with max verbal cues with transfers. Patient requires mod A and hand rail with ambulation  up to 40' with close wheelchair follow.   *See Care Plan and progress notes for long and short-term goals.   Revisions to Treatment Plan:  IOPI EMST   Teaching Needs: Safety, medications, transfers, toileting, etc.   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;  Barriers to Discharge: Medical stability;Behavior/Mood;Incontinence;Self-care education  Barriers to Discharge Comments: behavior- hx of polysubstance abuse- depression- impaired safety awareness/impulsive- and daily HA's; blurry vision Possible Resolutions to [employer] Focus: started TOpamax for daily HA's- on D3 diet- upgraded- needs full supervision- very depressed- increased Duloxetine for nerve pain and mood- d/c 4/25   Continued Need for Acute Rehabilitation Level of Care: The patient requires  daily medical management by a physician with specialized training in physical medicine and rehabilitation for the following reasons: Direction of a multidisciplinary physical rehabilitation program to maximize functional independence : Yes Medical management of patient stability for increased activity during participation in an intensive rehabilitation regime.: Yes Analysis of laboratory values and/or radiology reports with any subsequent need for medication adjustment and/or medical intervention. : Yes   I attest that I was present, lead the team conference, and concur with the assessment and plan of the team.   Konrad Dolores 02/24/2024, 1106 am

## 2024-02-24 NOTE — Progress Notes (Addendum)
 Patient has been assisted with all meals per order full supervision, patient is able to feed self. Mouth care and suctioning performed as follows. Assigned tech is aware, all nursing staff is aware the patient is a full supervision.      Tilden Dome, LPN

## 2024-02-24 NOTE — Plan of Care (Signed)
  Problem: Consults Goal: RH STROKE PATIENT EDUCATION Description: See Patient Education module for education specifics  Outcome: Progressing   Problem: RH BOWEL ELIMINATION Goal: RH STG MANAGE BOWEL WITH ASSISTANCE Description: STG Manage Bowel with toileting Assistance. Outcome: Progressing Goal: RH STG MANAGE BOWEL W/MEDICATION W/ASSISTANCE Description: STG Manage Bowel with Medication with mod I Assistance. Outcome: Progressing   Problem: RH SAFETY Goal: RH STG ADHERE TO SAFETY PRECAUTIONS W/ASSISTANCE/DEVICE Description: STG Adhere to Safety Precautions With cues Assistance/Device. Outcome: Progressing   Problem: RH PAIN MANAGEMENT Goal: RH STG PAIN MANAGED AT OR BELOW PT'S PAIN GOAL Description: Pain managed < 4 with prns Outcome: Progressing   Problem: RH KNOWLEDGE DEFICIT Goal: RH STG INCREASE KNOWLEDGE OF HYPERTENSION Description: Patient and mother will be able to manage HTN using educational resources for medications and dietary modification independently Outcome: Progressing Goal: RH STG INCREASE KNOWLEDGE OF DYSPHAGIA/FLUID INTAKE Description: Patient and mother will be able to manage dysphagia using educational resources for medications and dietary modification independently Outcome: Progressing Goal: RH STG INCREASE KNOWLEGDE OF HYPERLIPIDEMIA Description: Patient and mother will be able to manage HLD using educational resources for medications and dietary modification independently Outcome: Progressing Goal: RH STG INCREASE KNOWLEDGE OF STROKE PROPHYLAXIS Description: Patient and mother will be able to manage secondary risks using educational resources for medications and dietary modification independently Outcome: Progressing   Problem: Consults Goal: RH GENERAL PATIENT EDUCATION Description: See Patient Education module for education specifics. Outcome: Progressing Goal: Skin Care Protocol Initiated - if Braden Score 18 or less Description: If consults are  not indicated, leave blank or document N/A Outcome: Progressing Goal: Nutrition Consult-if indicated Outcome: Progressing Goal: Diabetes Guidelines if Diabetic/Glucose > 140 Description: If diabetic or lab glucose is > 140 mg/dl - Initiate Diabetes/Hyperglycemia Guidelines & Document Interventions  Outcome: Progressing

## 2024-02-24 NOTE — Plan of Care (Signed)
  Problem: RH BOWEL ELIMINATION Goal: RH STG MANAGE BOWEL WITH ASSISTANCE Description: STG Manage Bowel with toileting Assistance. Outcome: Progressing   Problem: RH SAFETY Goal: RH STG ADHERE TO SAFETY PRECAUTIONS W/ASSISTANCE/DEVICE Description: STG Adhere to Safety Precautions With cues Assistance/Device. Outcome: Progressing   Problem: RH PAIN MANAGEMENT Goal: RH STG PAIN MANAGED AT OR BELOW PT'S PAIN GOAL Description: Pain managed < 4 with prns Outcome: Progressing   Problem: RH KNOWLEDGE DEFICIT Goal: RH STG INCREASE KNOWLEDGE OF HYPERTENSION Description: Patient and mother will be able to manage HTN using educational resources for medications and dietary modification independently Outcome: Progressing   Problem: RH BLADDER ELIMINATION Goal: RH STG MANAGE BLADDER WITH ASSISTANCE Description: STG Manage Bladder With Assistance Outcome: Progressing   Problem: RH SKIN INTEGRITY Goal: RH STG SKIN FREE OF INFECTION/BREAKDOWN Outcome: Progressing   Problem: RH SAFETY Goal: RH STG ADHERE TO SAFETY PRECAUTIONS W/ASSISTANCE/DEVICE Description: STG Adhere to Safety Precautions With Assistance/Device. Outcome: Progressing   Problem: RH PAIN MANAGEMENT Goal: RH STG PAIN MANAGED AT OR BELOW PT'S PAIN GOAL Outcome: Progressing

## 2024-02-25 DIAGNOSIS — I63511 Cerebral infarction due to unspecified occlusion or stenosis of right middle cerebral artery: Secondary | ICD-10-CM | POA: Diagnosis not present

## 2024-02-25 NOTE — Progress Notes (Signed)
 Occupational Therapy Session Note  Patient Details  Name: Theresa Watkins MRN: [id number] Date of Birth: 23-May-1966  Today's Date: 02/25/2024 OT Individual Time: 0930-1030 OT Individual Time Calculation (min): 60 min    Short Term Goals: Week 2:  OT Short Term Goal 1 (Week 2): Pt will complete PROM with LUE without VC OT Short Term Goal 1 - Progress (Week 2): Met OT Short Term Goal 2 (Week 2): Pt will complete shower transfer at Mod A with LRAD OT Short Term Goal 2 - Progress (Week 2): Met OT Short Term Goal 3 (Week 2): Pt will complete toilet transfers with mod A OT Short Term Goal 3 - Progress (Week 2): Met OT Short Term Goal 4 (Week 2): Pt will complete toileting tasks with mod A OT Short Term Goal 4 - Progress (Week 2): Progressing toward goal OT Short Term Goal 5 (Week 2): Pt will complete LB dressing tasks with mod A OT Short Term Goal 5 - Progress (Week 2): Progressing toward goal Week 3:  OT Short Term Goal 1 (Week 3): Pt will complete LB dressing tasks with mod A OT Short Term Goal 2 (Week 3): Pt will complete LB dressing tasks with mod A OT Short Term Goal 3 (Week 3): Pt will utilize LUE as gross assist during funcitonal tasks with mod A  Skilled Therapeutic Interventions/Progress Updates:    1:1 Pt received in the w/c. Pt transferred from the w/c to the toilet with min A stand step pivot. Pt required A for management of left side of pants. Pt ambulated with min A with facilitation of left knee to maintain terminal hip and knee extension when stepping with right LE from toilet to shower. Pt bathed sit to stand in the shower with requested assistance to bathing buttocks and lower legs - pt could have performed these tasks but due to fatigue requested assistance. Pt performed min A transferred to the w/c and dressed at the sink with reminders for hemi dressing techniques - especially for threading left Le first. Focus on pushing up from w/c arm rest instead of pulling to come into  standing from w/c. Again needed A for clothing management on left side but able to maintain balance with min A.  After dressing Pt asked to ambulate. Pt ambulated from sink ~ 15 feet with quad cane with facilitation for sustained terminal knee extension and to wait until left Le is "on before stepping with right. Pt does supinate her left foot when stepping; facilitation to correct. Then ambulated another 20 feet with the rail with closer facilitation at the left hip and knee to be able to take a better step through with her right Le. Pt asked to get back into bed at end of session. Max cues for sequencing to end up in the bed in an optimal position with min A and extra time.  Pt left resting with call bell and bed alarm on.   Therapy Documentation Precautions:  Precautions Precautions: Fall Precaution/Restrictions Comments: L hemi Restrictions Weight Bearing Restrictions Per Provider Order: No  Pain: Pain Assessment Pain Scale: 0-10 Pain Score: 0-No pain   Therapy/Group: Individual Therapy  Roney Mans [facility] 02/25/2024, 12:07 PM

## 2024-02-25 NOTE — Progress Notes (Signed)
 Physical Therapy Session Note  Patient Details  Name: Theresa Watkins MRN: [id number] Date of Birth: 1966/05/08  Today's Date: 02/25/2024 PT Individual Time: 0805-0900 and 1130-1208 PT Individual Time Calculation (min): 55 min and 38 min  Short Term Goals: Week 2:  PT Short Term Goal 1 (Week 2): pt will ambulate 100 ft with assistance PT Short Term Goal 2 (Week 2): Pt will perform bed/chair transfer with CGA consistently PT Short Term Goal 3 (Week 2): Pt will initiate stair training  Skilled Therapeutic Interventions/Progress Updates: Pt presented in bed agreeable to therapy. Pt denies pain at start of session. Pt requesting to use bathroom, completed supine to sit with minA for truncal support with use of bed features. Stood in [place] with CGA primarily using RUE to pull self up to standing. Pt transported to toilet via [place] and sat with supervision for continent urinary void. Pt able to complete peri-care with set up at toilet. PTA threaded brief and pants total A for time management. Pt stood from toilet primarily using RUE and PTA completed clothing management with maxA as pt was able to assist in pulling up brief and pants. Dependent transfer to w/c with nsg arriving for am meds. During med pass pt participated in seated LE therex incl LAQ, and hamstring pulls. Pt very distracted and attempting to show therapist exercises that she has been doing with theraband. This therapist redirected pt to complete task as assigned and attempted to tap on hamstring ms to facilitate increased ms activation. Pt becoming agitated and stating "you're rushing me", this therapist attempting to explain rationale however pt consistently speaking over therapist when attempting to speak. Therex therefore terminated and pt worked on w/c mobility via hemi technique. Pt noted to frequently drift to L and would hit wall. When PTA attempted to straighten w/c pt becoming agitated again and would not accept assistance from  therapist. Pt was eventually able to correct self  however took more than a reasonable amount of time. Pt propelled ~154ft via hemi technique then propelled an additional 30ft using BLE for increased LLE ms recruitment. Pt handed off in hallway to SLP for next session with current needs met.   Tx2: Pt presented in bed agreeable to therapy, pt denies pain during session. Pt completed supine to sit with heavy use of bed features. Pt used footboard to assist scooting to EOB. Completed squat pivot transfer to w/c with minA and noted increased effort with decreased safety awareness as pt pulled up using w/c arm rest with noted w/c tipping but pt stating unaware. Pt agreeable to attempt short bout of ambulation with LBQC similarly to OT session. Pt transported to day room for time management. With [facility] pt attempted gait ~69ft requiring minA to stand and modA for gait. This therapist noted pt with significant pronation of foot, difficulty activating L quad, forward flexed posture and narrow BOS near scissoring. Discussed with pt trying something to stabilize L foot with pt expressing desire for knee brace to provide more support. Pt ultimately agreeable to try AFO vs knee brace at this time. PTA obtained [place] Walk on Reaction Plus with medial strut. Pt then ambulated an additional ~25ft with decreased pronation but continues to demonstrate significant knee and hip flexion. Pt however did indicate that she felt better "with the brace on" but wanted an additional brace to "support the knee". Pt completed squat pivot transfer to w/c with heavy mod A attempting to use [facility] however pt with poor foot placement and required  assist to land in w/c. Pt transported back to room and completed modified squat pivot to bed using bed rail. Pt completed sit to supine with supervision and use of bed rail. Pt left in bed at end of session with bed alarm on, call bell within reach and needs met.      Therapy  Documentation Precautions:  Precautions Precautions: Fall Precaution/Restrictions Comments: L hemi Restrictions Weight Bearing Restrictions Per Provider Order: No General:   Vital Signs: Therapy Vitals Temp: 97.9 F (36.6 C) Temp Source: Oral Pulse Rate: (!) 56 Resp: 18 BP: 108/62 Patient Position (if appropriate): Lying Oxygen Therapy SpO2: 95 % O2 Device: Room Air    Therapy/Group: Individual Therapy  Darron Stuck 02/25/2024, 4:11 PM

## 2024-02-25 NOTE — Progress Notes (Signed)
 PROGRESS NOTE   Subjective/Complaints:  Pt reports things good this AM.  Slept "perfect".   Mood slightly better this AM ROS:   Pt denies SOB, abd pain, CP, N/V/C/D, and vision changes   Objective:   No results found.   No results for input(s): "WBC", "HGB", "HCT", "PLT" in the last 72 hours.   No results for input(s): "NA", "K", "CL", "CO2", "GLUCOSE", "BUN", "CREATININE", "CALCIUM" in the last 72 hours.    Intake/Output Summary (Last 24 hours) at 02/25/2024 0804 Last data filed at 02/24/2024 1817 Gross per 24 hour  Intake 320 ml  Output --  Net 320 ml         Physical Exam: Vital Signs Blood pressure 126/68, pulse (!) 52, temperature 98.2 F (36.8 C), temperature source Oral, resp. rate 16, height 5\' 6"  (1.676 m), weight 64.4 kg, SpO2 98%.            General: awake, alert, appropriate, sitting up trying to eat; NAD HENT: conjugate gaze; oropharynx moist CV: regular to borderline bradycardic and regular  and rhythm; no JVD Pulmonary: CTA B/L; no W/R/R- good air movement GI: soft, NT, ND, (+)BS Psychiatric: appropriate Neurological: Ox3  Moderately severe dysarthria- slightly better- like when she first came in  Strength exam- LUE ~3/5- is better  LLE 3+ to 4-/5    Neurologic: Cranial nerves II through XII intact, motor strength is 5/5 in right , 2-/5 left  deltoid, bicep, tricep, grip, 5/5 Right, 3/5 left hip flexor, knee extensors, 5/5 RIght , 2- left ankle dorsiflexor and plantar flexor  Sensory exam normal sensation to light touch in right upper and lower extremities, feels pinch LLE , no sensation to pinch in left fingers Cerebellar exam normal finger to nose to finger RIght , unable to perform on left due to weakness Musculoskeletal:  + Pain and limited ROM in L hip internal >> extenal rotation; no deformity or crepitus.  FABER, FADIR with groin pain.   Unchanged  4/6  Assessment/Plan: 1. Functional deficits which require 3+ hours per day of interdisciplinary therapy in a comprehensive inpatient rehab setting. Physiatrist is providing close team supervision and 24 hour management of active medical problems listed below. Physiatrist and rehab team continue to assess barriers to discharge/monitor patient progress toward functional and medical goals  Care Tool:  Bathing    Body parts bathed by patient: Face, Left arm, Chest, Abdomen, Front perineal area, Right upper leg, Left upper leg, Right lower leg   Body parts bathed by helper: Right arm, Buttocks, Left lower leg     Bathing assist Assist Level: Moderate Assistance - Patient 50 - 74%     Upper Body Dressing/Undressing Upper body dressing   What is the patient wearing?: Pull over shirt    Upper body assist Assist Level: Moderate Assistance - Patient 50 - 74%    Lower Body Dressing/Undressing Lower body dressing      What is the patient wearing?: Underwear/pull up, Pants     Lower body assist Assist for lower body dressing: Maximal Assistance - Patient 25 - 49%     Toileting Toileting    Toileting assist Assist for toileting: Total Assistance -  Patient < 25%     Transfers Chair/bed transfer  Transfers assist     Chair/bed transfer assist level: Moderate Assistance - Patient 50 - 74%     Locomotion Ambulation   Ambulation assist      Assist level: 2 helpers (max a +w/c follow) Assistive device: Other (comment) (R hand rail) Max distance: 10   Walk 10 feet activity   Assist     Assist level: 2 helpers (max a +w/c follow) Assistive device: Other (comment) (R hand rail)   Walk 50 feet activity   Assist Walk 50 feet with 2 turns activity did not occur: Safety/medical concerns (L hemiplegia and pt fatigue)         Walk 150 feet activity   Assist Walk 150 feet activity did not occur: Safety/medical concerns (L hemiplegia and pt fatigue)          Walk 10 feet on uneven surface  activity   Assist Walk 10 feet on uneven surfaces activity did not occur: Safety/medical concerns (L hemiplegia and pt fatigue)         Wheelchair     Assist Is the patient using a wheelchair?: Yes Type of Wheelchair: Manual    Wheelchair assist level: Supervision/Verbal cueing, Minimal Assistance - Patient > 75% Max wheelchair distance: 150    Wheelchair 50 feet with 2 turns activity    Assist        Assist Level: Supervision/Verbal cueing   Wheelchair 150 feet activity     Assist      Assist Level: Minimal Assistance - Patient > 75%   Blood pressure 126/68, pulse (!) 52, temperature 98.2 F (36.8 C), temperature source Oral, resp. rate 16, height 5\' 6"  (1.676 m), weight 64.4 kg, SpO2 98%.   Medical Problem List and Plan: 1. 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  Antithrombotics: -DVT/anticoagulation:  Pharmaceutical: Lovenox             -antiplatelet therapy: Aspirin 81 mg daily and Plavix 75 mg daily x 3 weeks then Plavix alone 3. Pain Management: Tylenol as needed  3/25- denies pain- con't regimen rpn   3-30: Ongoing left hip pain; Voltaren gel and Tylenol.  3/31- will start Vit D and Ca per pt request- has helped hip pain in past?  4/1- pt insistent was very helpful already for hip pain  4/2- pt wants at the same time- per chart, Ca and Vit D come at same time  4/3- having new burning pain in LUE- which is seen sometimes with stroke- will add Duloxetine 30 mg daily for nerve pain- monitor for side effects.  -if gets nausea, will change to Lyrica 4/4-6 no side effects so far-  4/8- will  increase Duloxetine to 60 mg daily  4. Mood/Behavior/Sleep: Provide emotional support             -antipsychotic agents: N/A 5. Neuropsych/cognition: This patient is capable of making decisions on her own behalf. 6. Skin/Wound Care: Routine skin checks 7. Fluids/Electrolytes/Nutrition: Routine and outs with follow-up chemistries 8.  Dysphagia.  Dysphagia #2 thin liquids.  Follow-up speech therapy  3/28- no change in diet so far  3/31- Pt doesn't need provale cup- will let staff know  4/1- d/w SLP- can drink normally  4.2- pt wants ot upgrade diet- explained she's not ready and could get pneumonia if update too early  4/4.  She was started on D3 thin diet yesterday, continue to monitor  4/6  Eating 50-100% of meals 9.  Hyperlipidemia.  Crestor 10.  Recent History of cocaine/tobacco use.  UDS positive for cocaine.  Provide counseling, this has been a chronic issue , seen in PCP notes 11.  Hypothyroidism.  Synthroid 12.  Constipation.  MiraLAX daily, Senokot-S 1 tab twice daily   3/25- LBM last night after 10 days with no BM  4/7- LBM yesterday 13. Dysarthria- hard to understand- SLP ordered  4/3- Was worse this AM- will monitor for progress  4/4- slightly better today- can understand after 1-3 repetitions- low threshold to recheck Brain MRI  4/6 appears stable/vs a little improved  based on description from prior days  4/7- still somewhat hard to understand but getting back to where it was when came in.  14. Polycythemia  3/25- Hb 16.1- and BUN up to 18, so is a little dry- BUN was 9 prior.   3/26- will recheck labs Thursday  3/27- Back to 14.9 after drinking more  3/31- will check labs today 15. Blurry vision  3/27- will need Ophtho after d/c.   3-29: Seems to mostly be from the left eye, with difficulty converging and tracking; messaged OT and PT to trial taping versus alternating eye patch to help with convergence.  3-30: Reporting improvement with use of taping  4/4- pt reports  everything blurry- cannot see faces as a result- never had eye checked in past- used readers but cannot see at a distance since stroke- will send to Ophtho after d/c.   4/7- d/w pt again- she wants eyeglasses- explained we cannot get when in hospital- also per guidelines, since vision can still change, to not get for 3 months after stroke. She disbelieves it's from stroke, since had 2 prior- that didn't cause vision changes- we educated pt pn how this can occur.  16.  Left hip pain.  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 C/O feeling dry  4/1- Labs look good- BUN 15 and Cr 0.69 18. Cough  4/1- didn't hear the cough- sounds clear- if continues, will treat symptomatically.   4/2- pt feels like "coming down with something"- because her back is sore and was "freezing" last night- is 69 degrees in room 19. Severe HA/increased dysarthria  4/3- pt had severe HA this AM as well as increased dysarthria/difficulty speaking- no additional Neuro Sx's, but hadn't been having HA's-ordered STAT CT because my concern for possible new CVA/extension of stroke- Head CT (-)- Sx's are improving and given Fioricet as needed for HA- if continues today, will get MRI, but head CT shows no new CVA. 4/4- HA mild this AM- have a low threshold for doing MRI of brain since dysarthria slightly improved, but still severe- if any other Neuro Sx's this weekend, please get MRI of brain.  4/5-6 stable, headache improved, continue to monitor.  Continues to have severe dysarthria but can understand her for the most  part  4/7- Pt reports daily HA's since last week- she feels it's due to blurry vision, but having daily- is 8/10 this AM- will add Topamax 50 mg at bedtime for HA prevention.  20. Decreased safety awareness/impulsiveness  4/8- d/w pt as well as  mother that pt would benefit from slowing down when she attempts function in therapy and with nursing 21. Depression  4/9- increased Duloxetine yesterday- since slept well, feeling slightly better this AM      LOS: 16 days A FACE TO FACE EVALUATION WAS PERFORMED  Lerone Onder 02/25/2024, 8:04 AM

## 2024-02-25 NOTE — Progress Notes (Signed)
 Patient ID: Theresa Watkins, female   DOB: [birth year], 58 y.o.   MRN: [id number]  54- SW spoke with pt mother [name] to provide updates from team conference, and extension on d/c date now 4/25. She will follow-up with SW about a time for family edu next week.   Cecile Sheerer, MSW, LCSW Office: [phone] Cell: [phone] Fax: [phone]

## 2024-02-25 NOTE — Progress Notes (Addendum)
 Speech Language Pathology Daily Session Note  Patient Details  Name: Theresa Watkins MRN: [id number] Date of Birth: [birth year]  Today's Date: 02/25/2024 SLP Individual Time: 0902-0931 SLP Individual Time Calculation (min): 29 min  Short Term Goals: Week 3: SLP Short Term Goal 1 (Week 3): Patient will consume least restrictive diet with use of compensatory strategies given supervision multimodal A SLP Short Term Goal 2 (Week 3): Patient will increase speech intelligibility to 90% at the sentence level given min multimodal A  Skilled Therapeutic Interventions:  Patient was seen in am to address dysphagia management. Direct handoff from PTA with pt alert and upright in WC. Pt transitioned to room where breakfast meal remained. Pt requesting to finish meal. SLP provided set up A for D3 solids and thin liquids. Pt able to verbalize swallowing precautions and strategeis given min A. Pt consumed D3 solids with munch like manipulation as opposed to lateralizing bolus and initiating rotary mastication. With cues to draw pt's attention to manipulation vs mastication, pt able to demo lateralization of bolus and mastication on both left and right sides however carryover of mastication was inconsistent. Additionally, pt warranted intermittent cues throughout meal to limit talking, complete oral clearance, and to attend to left side residual.  She was without s/s aspiration throughout meal. Overall, pt demo much improved oral phase and safety awareness for meals. SLP recommending continuation of D3 solids and thin liquids with full supervision. SLP to continue POC.   Pain Pain Assessment Pain Scale: 0-10 Pain Score: 0-No pain  Therapy/Group: Individual Therapy  [name] 02/25/2024, 11:21 AM

## 2024-02-25 NOTE — Plan of Care (Signed)
  Problem: RH BOWEL ELIMINATION Goal: RH STG MANAGE BOWEL WITH ASSISTANCE Description: STG Manage Bowel with toileting Assistance. Outcome: Progressing   Problem: RH SAFETY Goal: RH STG ADHERE TO SAFETY PRECAUTIONS W/ASSISTANCE/DEVICE Description: STG Adhere to Safety Precautions With cues Assistance/Device. Outcome: Progressing   Problem: RH PAIN MANAGEMENT Goal: RH STG PAIN MANAGED AT OR BELOW PT'S PAIN GOAL Description: Pain managed < 4 with prns Outcome: Progressing   Problem: RH BOWEL ELIMINATION Goal: RH STG MANAGE BOWEL WITH ASSISTANCE Description: STG Manage Bowel with Assistance. Outcome: Progressing   Problem: RH BLADDER ELIMINATION Goal: RH STG MANAGE BLADDER WITH ASSISTANCE Description: STG Manage Bladder With Assistance Outcome: Progressing   Problem: RH SKIN INTEGRITY Goal: RH STG SKIN FREE OF INFECTION/BREAKDOWN Outcome: Progressing   Problem: RH SAFETY Goal: RH STG ADHERE TO SAFETY PRECAUTIONS W/ASSISTANCE/DEVICE Description: STG Adhere to Safety Precautions With Assistance/Device. Outcome: Progressing   Problem: RH PAIN MANAGEMENT Goal: RH STG PAIN MANAGED AT OR BELOW PT'S PAIN GOAL Outcome: Progressing

## 2024-02-25 NOTE — Progress Notes (Signed)
 Recreational Therapy Session Note  Patient Details  Name: Theresa Watkins MRN: [id number] Date of Birth: 05-22-1966 Today's Date: 02/25/2024  Pain: no c/o Skilled Therapeutic Interventions/Progress Updates:  Pt participated in animal assisted activity bed level with supervision.  Pt excited to see pet partner team when passing by in the hall calling out to request a visit.  Pt easily engaged in conversation with pet partner team, needing min cues for speech intelligeability.     Therapy/Group: Individual Therapy Theresa Watkins 02/25/2024, 3:23 PM

## 2024-02-26 DIAGNOSIS — I63511 Cerebral infarction due to unspecified occlusion or stenosis of right middle cerebral artery: Secondary | ICD-10-CM | POA: Diagnosis not present

## 2024-02-26 MED ORDER — BACLOFEN 5 MG HALF TABLET
5.0000 mg | ORAL_TABLET | Freq: Three times a day (TID) | ORAL | Status: DC
  Administered 2024-02-26 – 2024-03-03 (×19): 5 mg via ORAL
  Filled 2024-02-26 (×22): qty 1

## 2024-02-26 NOTE — Progress Notes (Signed)
 Recreational Therapy Session Note  Patient Details  Name: Theresa Watkins MRN: [id number] Date of Birth: 09/10/1966 Today's Date: 02/26/2024  Pain: c/o of the start of a migraine, premedicated, lights turned off in gym space Skilled Therapeutic Interventions/Progress Updates:  Goal:  Pt will maintain dynamic sitting balance with supervision.  MET Pt will incorporate LUE into leisure activity with set up/supervision.  MET  Session focused on dynamic sitting balance EOM.  Pt started sitting with LEs dangling off the elevated mat but preferred to sit criss cross applesauce during activity.  Pt participated in modified volleyball task hitting the beach ball with RUE with close supervision.  Transitioned to clasping RUE with LUE for LUE involvement/PROM.  Pt required verbal instruction and tactile cuing, but able to complete task seated EOM with close supervision.    Therapy/Group: Co-Treatment   [name] 02/26/2024, 11:59 AM

## 2024-02-26 NOTE — Progress Notes (Signed)
 PROGRESS NOTE   Subjective/Complaints:  Pt reports LBM last night Has a "scab" on her L 2nd PIP that she says she pulls off intermittently but regrows immediately- overgrowth of skin and very thick.  Has had for at least 1 year.   Also per OT might be getting spasticity.  ROS:   Pt denies SOB, abd pain, CP, N/V/C/D, and vision changes    Objective:   No results found.   No results for input(s): "WBC", "HGB", "HCT", "PLT" in the last 72 hours.   No results for input(s): "NA", "K", "CL", "CO2", "GLUCOSE", "BUN", "CREATININE", "CALCIUM" in the last 72 hours.    Intake/Output Summary (Last 24 hours) at 02/26/2024 0953 Last data filed at 02/25/2024 2000 Gross per 24 hour  Intake 2783 ml  Output --  Net 2783 ml         Physical Exam: Vital Signs Blood pressure 104/62, pulse (!) 59, temperature 98 F (36.7 C), resp. rate 16, height 5\' 6"  (1.676 m), weight 64.4 kg, SpO2 94%.             General: awake, alert, appropriate, sitting up in bed; fair to poor sitting balance- OT in room;  NAD HENT: conjugate gaze; oropharynx moist CV: regular rate and rhythm; no JVD Pulmonary: CTA B/L; no W/R/R- good air movement GI: soft, NT, ND, (+)BS Psychiatric: appropriate Neurological:  MAS of 1+ in L shoulder and 1 in L elbow- none in L wrist or fingers so far- Hoffman's (+) in LUE  Moderately severe dysarthria- slightly better, but esp has problems with P's this AM; slightly better-   Strength exam- LUE ~3/5- is better  LLE 3+ to 4-/5  Skin- L PIP of 2nd digit- has very large scab vs plaque that's amost 1 inch long and encompasses front and sides of PIP- flesh colored, but concern for basal cell Cancer  Neurologic: Cranial nerves II through XII intact, motor strength is 5/5 in right , 2-/5 left  deltoid, bicep, tricep, grip, 5/5 Right, 3/5 left hip flexor, knee extensors, 5/5 RIght , 2- left ankle dorsiflexor and  plantar flexor  Sensory exam normal sensation to light touch in right upper and lower extremities, feels pinch LLE , no sensation to pinch in left fingers Cerebellar exam normal finger to nose to finger RIght , unable to perform on left due to weakness Musculoskeletal:  + Pain and limited ROM in L hip internal >> extenal rotation; no deformity or crepitus.  FABER, FADIR with groin pain.   Unchanged 4/6  Assessment/Plan: 1. Functional deficits which require 3+ hours per day of interdisciplinary therapy in a comprehensive inpatient rehab setting. Physiatrist is providing close team supervision and 24 hour management of active medical problems listed below. Physiatrist and rehab team continue to assess barriers to discharge/monitor patient progress toward functional and medical goals  Care Tool:  Bathing    Body parts bathed by patient: Face, Left arm, Chest, Abdomen, Front perineal area, Right upper leg, Left upper leg, Right lower leg   Body parts bathed by helper: Right arm, Buttocks, Left lower leg     Bathing assist Assist Level: Moderate Assistance - Patient 50 - 74%  Upper Body Dressing/Undressing Upper body dressing   What is the patient wearing?: Pull over shirt    Upper body assist Assist Level: Moderate Assistance - Patient 50 - 74%    Lower Body Dressing/Undressing Lower body dressing      What is the patient wearing?: Pants     Lower body assist Assist for lower body dressing: Maximal Assistance - Patient 25 - 49%     Toileting Toileting    Toileting assist Assist for toileting: Total Assistance - Patient < 25%     Transfers Chair/bed transfer  Transfers assist     Chair/bed transfer assist level: Moderate Assistance - Patient 50 - 74%     Locomotion Ambulation   Ambulation assist      Assist level: 2 helpers (max a +w/c follow) Assistive device: Other (comment) (R hand rail) Max distance: 10   Walk 10 feet activity   Assist      Assist level: 2 helpers (max a +w/c follow) Assistive device: Other (comment) (R hand rail)   Walk 50 feet activity   Assist Walk 50 feet with 2 turns activity did not occur: Safety/medical concerns (L hemiplegia and pt fatigue)         Walk 150 feet activity   Assist Walk 150 feet activity did not occur: Safety/medical concerns (L hemiplegia and pt fatigue)         Walk 10 feet on uneven surface  activity   Assist Walk 10 feet on uneven surfaces activity did not occur: Safety/medical concerns (L hemiplegia and pt fatigue)         Wheelchair     Assist Is the patient using a wheelchair?: Yes Type of Wheelchair: Manual    Wheelchair assist level: Supervision/Verbal cueing, Minimal Assistance - Patient > 75% Max wheelchair distance: 150    Wheelchair 50 feet with 2 turns activity    Assist        Assist Level: Supervision/Verbal cueing   Wheelchair 150 feet activity     Assist      Assist Level: Minimal Assistance - Patient > 75%   Blood pressure 104/62, pulse (!) 59, temperature 98 F (36.7 C), resp. rate 16, height 5\' 6"  (1.676 m), weight 64.4 kg, SpO2 94%.   Medical Problem List and Plan: 1. 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  Antithrombotics: -DVT/anticoagulation:  Pharmaceutical: Lovenox             -antiplatelet therapy: Aspirin 81 mg daily and Plavix 75 mg daily x 3 weeks then Plavix alone 3. Pain Management: Tylenol as needed  3/25- denies pain- con't regimen rpn   3-30: Ongoing left hip pain; Voltaren gel and Tylenol.  3/31- will start Vit D and Ca per pt  request- has helped hip pain in past?  4/1- pt insistent was very helpful already for hip pain  4/2- pt wants at the same time- per chart, Ca and Vit D come at same time  4/3- having new burning pain in LUE- which  is seen sometimes with stroke- will add Duloxetine 30 mg daily for nerve pain- monitor for side effects.  -if gets nausea, will change to Lyrica 4/4-6 no side effects so far-  4/8- will increase Duloxetine to 60 mg daily  4. Mood/Behavior/Sleep: Provide emotional support  4/10- mood slightly better today             -antipsychotic agents: N/A 5. Neuropsych/cognition: This patient is capable of making decisions on her own behalf. 6. Skin/Wound Care: Routine skin checks 7. Fluids/Electrolytes/Nutrition: Routine and outs with follow-up chemistries 8.  Dysphagia.  Dysphagia #2 thin liquids.  Follow-up speech therapy  3/28- no change in diet so far  3/31- Pt doesn't need provale cup- will let staff know  4/1- d/w SLP- can drink normally  4.2- pt wants ot upgrade diet- explained she's not ready and could get pneumonia if update too early  4/4.  She was started on D3 thin diet yesterday, continue to monitor  4/6  Eating 50-100% of meals 9.  Hyperlipidemia.  Crestor 10.  Recent History of cocaine/tobacco use.  UDS positive for cocaine.  Provide counseling, this has been a chronic issue , seen in PCP notes 11.  Hypothyroidism.  Synthroid 12.  Constipation.  MiraLAX daily, Senokot-S 1 tab twice daily   3/25- LBM last night after 10 days with no BM  4/7- LBM yesterday 13. Dysarthria- hard to understand- SLP ordered  4/3- Was worse this AM- will monitor for progress  4/4- slightly better today- can understand after 1-3 repetitions- low threshold to recheck Brain MRI  4/6 appears stable/vs a little improved  based on description from prior days  4/7- still somewhat hard to understand but getting back to where it was when came in.   4/10- more problems with P's than anything other  letter 14. Polycythemia  3/25- Hb 16.1- and BUN up to 18, so is a little dry- BUN was 9 prior.   3/26- will recheck labs Thursday  3/27- Back to 14.9 after drinking more  3/31- will check labs today 15. Blurry vision  3/27- will need Ophtho after d/c.   3-29: Seems to mostly be from the left eye, with difficulty converging and tracking; messaged OT and PT to trial taping versus alternating eye patch to help with convergence.  3-30: Reporting improvement with use of taping  4/4- pt reports everything blurry- cannot see faces as a result- never had eye checked in past- used readers but cannot see at a distance since stroke- will send to Ophtho after d/c.   4/7- d/w pt again- she wants eyeglasses- explained we cannot get when in hospital- also per guidelines, since vision can still change, to not get for 3 months after stroke. She disbelieves it's from stroke, since had 2 prior- that didn't cause vision changes- we educated pt pn how this can occur.  16.  Left hip pain.  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 C/O feeling dry  4/1- Labs look good- BUN 15 and Cr 0.69 18. Cough  4/1- didn't hear the cough- sounds clear- if continues, will treat symptomatically.   4/2- pt feels like "coming down with something"- because her back is sore and was "freezing" last night- is 69 degrees in room 19. Severe HA/increased dysarthria  4/3- pt had severe  HA this AM as well as increased dysarthria/difficulty speaking- no additional Neuro Sx's, but hadn't been having HA's-ordered STAT CT because my concern for possible new CVA/extension of stroke- Head CT (-)- Sx's are improving and given Fioricet as needed for HA- if continues today, will get MRI, but head CT shows no new CVA. 4/4- HA mild this AM- have a low threshold for doing  MRI of brain since dysarthria slightly improved, but still severe- if any other Neuro Sx's this weekend, please get MRI of brain.  4/5-6 stable, headache improved, continue to monitor.  Continues to have severe dysarthria but can understand her for the most part  4/7- Pt reports daily HA's since last week- she feels it's due to blurry vision, but having daily- is 8/10 this AM- will add Topamax 50 mg at bedtime for HA prevention.   4/10- didn't c/o HA 20. Decreased safety awareness/impulsiveness  4/8- d/w pt as well as mother that pt would benefit from slowing down when she attempts function in therapy and with nursing 21. Depression  4/9- increased Duloxetine yesterday- since slept well, feeling slightly better this AM  4/10- Slightly improved mood today 22. Spasticity  4/10- educated pt some on spasticity- that will increase up to 1 year- and will start Baclofen 5 mg TID and monitor for side effects- to help spasticity/also d/w OT about ROM 23. 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 .    I spent a total of 50  minutes on total care today- >50% coordination of care- due to   D/w pt and OT about spasticity- and educated pt for prlonged period on spasticity what it is- how it can progress and how to treat- Also d/w OT about ROM needed- and d/w PA about referring to Derm for concerning lesion on L finger  LOS: 17 days A FACE TO FACE EVALUATION WAS PERFORMED  Lalisa Kiehn 02/26/2024, 9:53 AM

## 2024-02-26 NOTE — Progress Notes (Signed)
 Physical Therapy Weekly Progress Note  Patient Details  Name: NOZOMI METTLER MRN: [id number] Date of Birth: October 06, 1966  Beginning of progress report period: February 17, 2024 End of progress report period: February 26, 2024  Today's Date: 02/26/2024 PT Individual Time: 0900-1015 PT Individual Time Calculation (min): 75 min   Patient has met 1 of 3 short term goals.  Pt is progressing well with gait and transfers. Family education initiated with discussion of pt need for 24/7 supervision.   Patient continues to demonstrate the following deficits muscle weakness, decreased cardiorespiratoy endurance, impaired timing and sequencing, abnormal tone, and decreased coordination, and decreased sitting balance, decreased standing balance, and decreased balance strategies and therefore will continue to benefit from skilled PT intervention to increase functional independence with mobility.  Patient progressing toward long term goals..  Continue plan of care.  PT Short Term Goals Week 2:  PT Short Term Goal 1 (Week 2): pt will ambulate 100 ft with assistance PT Short Term Goal 1 - Progress (Week 2): Progressing toward goal PT Short Term Goal 2 (Week 2): Pt will perform bed/chair transfer with CGA consistently PT Short Term Goal 2 - Progress (Week 2): Met PT Short Term Goal 3 (Week 2): Pt will initiate stair training PT Short Term Goal 3 - Progress (Week 2): Not met Week 3:  PT Short Term Goal 1 (Week 3): pt will initiate stair training PT Short Term Goal 2 (Week 3): pt will ambulate 100 ft with assistance PT Short Term Goal 3 (Week 3): Pt will be able to recall steps and direct transfer safely  Skilled Therapeutic Interventions/Progress Updates:    pt received in bed and agreeable to therapy. Pt with intermittent LLE pain, but no intervention required. Pt found sideways in bed with he LLE hanging out over the rail.  Pt performed squat pivot transfer x 4 during session, required step by step cueing for  safety throughout. Transfers best when able to reach RUE over to surface transferring to and pull over.   Performed Stand pivot transfer with max a with quad cane for dynamic balance and to maintain knee extension, VC for sequencing. Performed various sit<>stand trials for LE strength with emphasis on finding balance in standing. Pt prefers to pull up to stand and could pull up with CGA. Discussed this isn't functional for life, so practiced pushing up from seat or thighs and then moving R hand to therapist's shoulder for UE support.   Pt performed seated ball taps with RUE and then with both with fingers interlaced for active assist for core control and increased LUE engagement. Pt seated in cross legged position on table. No LOB noted and pt with good tolerance to activity.   Pt returned to room and to bed, was left with all needs in reach and alarm active.   Therapy Documentation Precautions:  Precautions Precautions: Fall Precaution/Restrictions Comments: L hemi Restrictions Weight Bearing Restrictions Per Provider Order: No General:    Therapy/Group: Individual Therapy  Juluis Rainier 02/26/2024, 12:30 PM

## 2024-02-26 NOTE — Progress Notes (Signed)
 Occupational Therapy Session Note  Patient Details  Name: Theresa Watkins MRN: [id number] Date of Birth: [birth year]  Today's Date: 02/26/2024 OT Individual Time: 0700-0813 OT Individual Time Calculation (min): 73 min    Short Term Goals: Week 3:  OT Short Term Goal 1 (Week 3): Pt will complete LB dressing tasks with mod A OT Short Term Goal 2 (Week 3): Pt will complete LB dressing tasks with mod A OT Short Term Goal 3 (Week 3): Pt will utilize LUE as gross assist during funcitonal tasks with mod A  Skilled Therapeutic Interventions/Progress Updates:    Skilled OT intervention with focus on self feeding, swallowing precautions, sitting balance, dressing with sit<>stand from EOB, and safety awareness to increase indepdence with BADLs. Pt required mod verbal cues for portion mgmt and swallowing precautions while eating breakfast. Pt easily distracted and attempts to talk with food in mouth. Supine>sit EOB with supervision. Max verbal cues for hemi dressing techniques. UB dressing with mod A. Pt able to initate threading pants over BLE but required assitance to complete and pull over hips when standing. Sit<>stand from EOB with min A. Mod A for standing balance with verbal cues to shift weight. Pt requested to return to bed. All needs within reach. Bed alarm activated.   Therapy Documentation Precautions:  Precautions Precautions: Fall Precaution/Restrictions Comments: L hemi Restrictions Weight Bearing Restrictions Per Provider Order: No Pain: Pt denies pain this morning   Therapy/Group: Individual Therapy  Rich Brave 02/26/2024, 8:14 AM

## 2024-02-26 NOTE — Plan of Care (Signed)
  Problem: Consults Goal: RH STROKE PATIENT EDUCATION Description: See Patient Education module for education specifics  Outcome: Progressing   Problem: RH BOWEL ELIMINATION Goal: RH STG MANAGE BOWEL WITH ASSISTANCE Description: STG Manage Bowel with toileting Assistance. Outcome: Progressing   Problem: RH BOWEL ELIMINATION Goal: RH STG MANAGE BOWEL W/MEDICATION W/ASSISTANCE Description: STG Manage Bowel with Medication with mod I Assistance. Outcome: Progressing   Problem: RH SAFETY Goal: RH STG ADHERE TO SAFETY PRECAUTIONS W/ASSISTANCE/DEVICE Description: STG Adhere to Safety Precautions With cues Assistance/Device. Outcome: Progressing   Problem: RH PAIN MANAGEMENT Goal: RH STG PAIN MANAGED AT OR BELOW PT'S PAIN GOAL Description: Pain managed < 4 with prns Outcome: Progressing   Problem: RH KNOWLEDGE DEFICIT Goal: RH STG INCREASE KNOWLEDGE OF HYPERTENSION Description: Patient and mother will be able to manage HTN using educational resources for medications and dietary modification independently Outcome: Progressing   Problem: RH SKIN INTEGRITY Goal: RH STG SKIN FREE OF INFECTION/BREAKDOWN Outcome: Progressing   Problem: RH BLADDER ELIMINATION Goal: RH STG MANAGE BLADDER WITH EQUIPMENT WITH ASSISTANCE Description: STG Manage Bladder With Equipment With Assistance Outcome: Progressing   Problem: RH BLADDER ELIMINATION Goal: RH STG MANAGE BLADDER WITH MEDICATION WITH ASSISTANCE Description: STG Manage Bladder With Medication With Assistance. Outcome: Progressing

## 2024-02-26 NOTE — Progress Notes (Signed)
 SLP Cancellation Note  Patient Details Name: Theresa Watkins MRN: [id number] DOB: [birth year]   Cancelled treatment:        SLP attempted to conduct skilled therapy session targeting communication and swallowing goals. Upon entry, patient asleep with lights off. She easily roused but appeared groggy and lethargic. Patient indicated she had just received migraine medication and was subsequently drowsy and still in pain. Patient indicated she needed to rest rather than engage in SLP services this date. Will continue POC.                                                                                               Jeannie Done, M.A., CCC-SLP  Yetta Barre 02/26/2024, 2:36 PM

## 2024-02-26 NOTE — Plan of Care (Signed)
  Problem: Consults Goal: RH STROKE PATIENT EDUCATION Description: See Patient Education module for education specifics  Outcome: Progressing   Problem: RH BOWEL ELIMINATION Goal: RH STG MANAGE BOWEL WITH ASSISTANCE Description: STG Manage Bowel with toileting Assistance. Outcome: Progressing   Problem: RH SAFETY Goal: RH STG ADHERE TO SAFETY PRECAUTIONS W/ASSISTANCE/DEVICE Description: STG Adhere to Safety Precautions With cues Assistance/Device. Outcome: Progressing   Problem: RH KNOWLEDGE DEFICIT Goal: RH STG INCREASE KNOWLEDGE OF HYPERTENSION Description: Patient and mother will be able to manage HTN using educational resources for medications and dietary modification independently Outcome: Progressing Goal: RH STG INCREASE KNOWLEDGE OF DYSPHAGIA/FLUID INTAKE Description: Patient and mother will be able to manage dysphagia using educational resources for medications and dietary modification independently Outcome: Progressing Goal: RH STG INCREASE KNOWLEGDE OF HYPERLIPIDEMIA Description: Patient and mother will be able to manage HLD using educational resources for medications and dietary modification independently Outcome: Progressing Goal: RH STG INCREASE KNOWLEDGE OF STROKE PROPHYLAXIS Description: Patient and mother will be able to manage secondary risks using educational resources for medications and dietary modification independently Outcome: Progressing

## 2024-02-27 DIAGNOSIS — I63511 Cerebral infarction due to unspecified occlusion or stenosis of right middle cerebral artery: Secondary | ICD-10-CM | POA: Diagnosis not present

## 2024-02-27 MED ORDER — LORATADINE 10 MG PO TABS
10.0000 mg | ORAL_TABLET | Freq: Every day | ORAL | Status: DC
  Administered 2024-02-27 – 2024-04-27 (×61): 10 mg via ORAL
  Filled 2024-02-27 (×62): qty 1

## 2024-02-27 MED ORDER — SALINE SPRAY 0.65 % NA SOLN
1.0000 | NASAL | Status: DC | PRN
Start: 2024-02-27 — End: 2024-04-28

## 2024-02-27 NOTE — Plan of Care (Signed)
  Problem: Consults Goal: RH STROKE PATIENT EDUCATION Description: See Patient Education module for education specifics  Outcome: Progressing   Problem: RH BOWEL ELIMINATION Goal: RH STG MANAGE BOWEL WITH ASSISTANCE Description: STG Manage Bowel with toileting Assistance. Outcome: Progressing Goal: RH STG MANAGE BOWEL W/MEDICATION W/ASSISTANCE Description: STG Manage Bowel with Medication with mod I Assistance. Outcome: Progressing   Problem: RH PAIN MANAGEMENT Goal: RH STG PAIN MANAGED AT OR BELOW PT'S PAIN GOAL Description: Pain managed < 4 with prns Outcome: Progressing   Problem: Consults Goal: RH GENERAL PATIENT EDUCATION Description: See Patient Education module for education specifics. Outcome: Progressing   Problem: RH BOWEL ELIMINATION Goal: RH STG MANAGE BOWEL WITH ASSISTANCE Description: STG Manage Bowel with Assistance. Outcome: Progressing   Problem: RH BLADDER ELIMINATION Goal: RH STG MANAGE BLADDER WITH ASSISTANCE Description: STG Manage Bladder With Assistance Outcome: Progressing   Problem: RH PAIN MANAGEMENT Goal: RH STG PAIN MANAGED AT OR BELOW PT'S PAIN GOAL Outcome: Progressing   Problem: RH SAFETY Goal: RH STG ADHERE TO SAFETY PRECAUTIONS W/ASSISTANCE/DEVICE Description: STG Adhere to Safety Precautions With cues Assistance/Device. Outcome: Adequate for Discharge

## 2024-02-27 NOTE — Progress Notes (Signed)
 PROGRESS NOTE   Subjective/Complaints:  Pt reports doing well this AM LBM 2 days ago, but notes can go today.  NT brought in her breakfast. No complaints  ROS:   Pt denies SOB, abd pain, CP, N/V/C/D, and vision changes   Objective:   No results found.   No results for input(s): "WBC", "HGB", "HCT", "PLT" in the last 72 hours.   No results for input(s): "NA", "K", "CL", "CO2", "GLUCOSE", "BUN", "CREATININE", "CALCIUM" in the last 72 hours.    Intake/Output Summary (Last 24 hours) at 02/27/2024 0825 Last data filed at 02/26/2024 1831 Gross per 24 hour  Intake 356 ml  Output --  Net 356 ml         Physical Exam: Vital Signs Blood pressure 122/64, pulse (!) 54, temperature 98.1 F (36.7 C), temperature source Oral, resp. rate 17, height 5\' 6"  (1.676 m), weight 64.4 kg, SpO2 94%.              General: awake, alert, appropriate, just woke her up- slightly sleepy; NAD HENT: conjugate gaze; oropharynx moist CV: regular rhythm, bradycardic rate; no JVD Pulmonary: CTA B/L; no W/R/R- good air movement GI: soft, NT, ND, (+)BS Psychiatric: appropriate- sleepy- but interactive- brighter affect Neurological: more alert, but sleepy- still significant dysarthria   MAS of 1+ in L shoulder and 1 in L elbow- none in L wrist or fingers so far- Hoffman's (+) in LUE- no change today since meds have worn off overnight  Moderately severe dysarthria- slightly better, but esp has problems with P's this AM; slightly better-   Strength exam- LUE ~3/5- is better  LLE 3+ to 4-/5  Skin- L PIP of 2nd digit- has very large scab vs plaque that's amost 1 inch long and encompasses front and sides of PIP- flesh colored, but concern for basal cell Cancer  Neurologic: Cranial nerves II through XII intact, motor strength is 5/5 in right , 2-/5 left  deltoid, bicep, tricep, grip, 5/5 Right, 3/5 left hip flexor, knee extensors, 5/5  RIght , 2- left ankle dorsiflexor and plantar flexor  Sensory exam normal sensation to light touch in right upper and lower extremities, feels pinch LLE , no sensation to pinch in left fingers Cerebellar exam normal finger to nose to finger RIght , unable to perform on left due to weakness Musculoskeletal:  + Pain and limited ROM in L hip internal >> extenal rotation; no deformity or crepitus.  FABER, FADIR with groin pain.     Assessment/Plan: 1. Functional deficits which require 3+ hours per day of interdisciplinary therapy in a comprehensive inpatient rehab setting. Physiatrist is providing close team supervision and 24 hour management of active medical problems listed below. Physiatrist and rehab team continue to assess barriers to discharge/monitor patient progress toward functional and medical goals  Care Tool:  Bathing    Body parts bathed by patient: Face, Left arm, Chest, Abdomen, Front perineal area, Right upper leg, Left upper leg, Right lower leg   Body parts bathed by helper: Right arm, Buttocks, Left lower leg     Bathing assist Assist Level: Moderate Assistance - Patient 50 - 74%     Upper Body Dressing/Undressing  Upper body dressing   What is the patient wearing?: Pull over shirt    Upper body assist Assist Level: Moderate Assistance - Patient 50 - 74%    Lower Body Dressing/Undressing Lower body dressing      What is the patient wearing?: Pants     Lower body assist Assist for lower body dressing: Maximal Assistance - Patient 25 - 49%     Toileting Toileting    Toileting assist Assist for toileting: Total Assistance - Patient < 25%     Transfers Chair/bed transfer  Transfers assist     Chair/bed transfer assist level: Contact Guard/Touching assist     Locomotion Ambulation   Ambulation assist      Assist level: 2 helpers Assistive device: Other (comment) (R hand rail) Max distance: 60   Walk 10 feet activity   Assist      Assist level: 2 helpers Assistive device: Other (comment) (R hand rail)   Walk 50 feet activity   Assist Walk 50 feet with 2 turns activity did not occur: Safety/medical concerns (L hemiplegia and pt fatigue)  Assist level: 2 helpers      Walk 150 feet activity   Assist Walk 150 feet activity did not occur: Safety/medical concerns (L hemiplegia and pt fatigue)         Walk 10 feet on uneven surface  activity   Assist Walk 10 feet on uneven surfaces activity did not occur: Safety/medical concerns (L hemiplegia and pt fatigue)         Wheelchair     Assist Is the patient using a wheelchair?: Yes Type of Wheelchair: Manual    Wheelchair assist level: Supervision/Verbal cueing, Minimal Assistance - Patient > 75% Max wheelchair distance: 150    Wheelchair 50 feet with 2 turns activity    Assist        Assist Level: Supervision/Verbal cueing   Wheelchair 150 feet activity     Assist      Assist Level: Minimal Assistance - Patient > 75%   Blood pressure 122/64, pulse (!) 54, temperature 98.1 F (36.7 C), temperature source Oral, resp. rate 17, height 5\' 6"  (1.676 m), weight 64.4 kg, SpO2 94%.   Medical Problem List and Plan: 1. 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  Antithrombotics: -DVT/anticoagulation:  Pharmaceutical: Lovenox             -antiplatelet therapy: Aspirin 81 mg daily and Plavix 75 mg daily x 3 weeks then Plavix alone 3. Pain Management: Tylenol as needed  3/25- denies pain- con't regimen rpn   3-30: Ongoing left hip pain; Voltaren gel and Tylenol.  3/31- will start  Vit D and Ca per pt request- has helped hip pain in past?  4/1- pt insistent was very helpful already for hip pain  4/2- pt wants at the same time- per chart, Ca and Vit D come at same time  4/3- having new burning pain in LUE- which is seen sometimes with stroke- will add Duloxetine 30 mg daily for  nerve pain- monitor for side effects.  -if gets nausea, will change to Lyrica 4/4-6 no side effects so far-  4/8- will increase Duloxetine to 60 mg daily  4/11- NO side effects of Duloxetine, but mood appears slightly brighter 4. Mood/Behavior/Sleep: Provide emotional support  4/10- mood slightly better today  4/11- mood also looked slightly better this AM             -antipsychotic agents: N/A 5. Neuropsych/cognition: This patient is capable of making decisions on her own behalf. 6. Skin/Wound Care: Routine skin checks 7. Fluids/Electrolytes/Nutrition: Routine and outs with follow-up chemistries 8.  Dysphagia.  Dysphagia #2 thin liquids.  Follow-up speech therapy  3/28- no change in diet so far  3/31- Pt doesn't need provale cup- will let staff know  4/1- d/w SLP- can drink normally  4.2- pt wants ot upgrade diet- explained she's not ready and could get pneumonia if update too early  4/4.  She was started on D3 thin diet yesterday, continue to monitor  4/6  Eating 50-100% of meals 9.  Hyperlipidemia.  Crestor 10.  Recent History of cocaine/tobacco use.  UDS positive for cocaine.  Provide counseling, this has been a chronic issue , seen in PCP notes 11.  Hypothyroidism.  Synthroid 12.  Constipation.  MiraLAX daily, Senokot-S 1 tab twice daily   3/25- LBM last night after 10 days with no BM  4/7- LBM yesterday 13. Dysarthria- hard to understand- SLP ordered  4/3- Was worse this AM- will monitor for progress  4/4- slightly better today- can understand after 1-3 repetitions- low threshold to recheck Brain MRI  4/6 appears stable/vs a little improved  based on description from prior days  4/7-  still somewhat hard to understand but getting back to where it was when came in.   4/10- more problems with P's than anything other letter 14. Polycythemia  3/25- Hb 16.1- and BUN up to 18, so is a little dry- BUN was 9 prior.   3/26- will recheck labs Thursday  3/27- Back to 14.9 after drinking more  3/31- will check labs today 15. Blurry vision  3/27- will need Ophtho after d/c.   3-29: Seems to mostly be from the left eye, with difficulty converging and tracking; messaged OT and PT to trial taping versus alternating eye patch to help with convergence.  3-30: Reporting improvement with use of taping  4/4- pt reports everything blurry- cannot see faces as a result- never had eye checked in past- used readers but cannot see at a distance since stroke- will send to Ophtho after d/c.   4/7- d/w pt again- she wants eyeglasses- explained we cannot get when in hospital- also per guidelines, since vision can still change, to not get for 3 months after stroke. She disbelieves it's from stroke, since had 2 prior- that didn't cause vision changes- we educated pt pn how this can occur.  16.  Left hip pain.  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 C/O feeling dry  4/1- Labs look good- BUN 15 and Cr 0.69  4/11- will recheck labs MOnday 18. Cough  4/1- didn't hear the cough- sounds clear- if continues, will treat symptomatically.   4/2- pt feels like "coming down with something"- because her back is sore and was "freezing" last night-  is 69 degrees in room 19. Severe HA/increased dysarthria  4/3- pt had severe HA this AM as well as increased dysarthria/difficulty speaking- no additional Neuro Sx's, but hadn't been having HA's-ordered STAT CT because my concern for possible new CVA/extension of stroke- Head CT (-)- Sx's are  improving and given Fioricet as needed for HA- if continues today, will get MRI, but head CT shows no new CVA. 4/4- HA mild this AM- have a low threshold for doing MRI of brain since dysarthria slightly improved, but still severe- if any other Neuro Sx's this weekend, please get MRI of brain.  4/5-6 stable, headache improved, continue to monitor.  Continues to have severe dysarthria but can understand her for the most part  4/7- Pt reports daily HA's since last week- she feels it's due to blurry vision, but having daily- is 8/10 this AM- will add Topamax 50 mg at bedtime for HA prevention.   4/10- 4/11- didn't c/o HA 20. Decreased safety awareness/impulsiveness  4/8- d/w pt as well as mother that pt would benefit from slowing down when she attempts function in therapy and with nursing 21. Depression  4/9- increased Duloxetine yesterday- since slept well, feeling slightly better this AM  4/10-4/11 Slightly improved mood today 22. Spasticity  4/10- educated pt some on spasticity- that will increase up to 1 year- and will start Baclofen 5 mg TID and monitor for side effects- to help spasticity/also d/w OT about ROM  4/11- Baclofen has worn off this AM since given at 10pm and lasts 8 hours- will ask therapy how it's doing.  23. 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 .   I spent a total of 35   minutes on total care today- >50% coordination of care- due to  D/w therapy about her spasticity as well as NT about her breakfast- also reviewed  LOS: 18 days A FACE TO FACE EVALUATION WAS PERFORMED  Theresa Watkins 02/27/2024, 8:25 AM

## 2024-02-27 NOTE — Progress Notes (Signed)
 Occupational Therapy Session Note  Patient Details  Name: Theresa Watkins MRN: [id number] Date of Birth: 02-22-1966  Today's Date: 02/27/2024 OT Individual Time: 0805 - 0915 OT Individual Treatment:  70 min      Short Term Goals: Week 3:  OT Short Term Goal 1 (Week 3): Pt will complete LB dressing tasks with mod A OT Short Term Goal 2 (Week 3): Pt will complete LB dressing tasks with mod A OT Short Term Goal 3 (Week 3): Pt will utilize LUE as gross assist during funcitonal tasks with mod A  Skilled Therapeutic Interventions/Progress Updates:   Patient received supported sitting in bed eating breakfast with NT at bedside.  Patient needs cueing to not talk while eating.  Patient reports significant headache - see below.   Patient declined shower.  Patient able to sit at edge of bed with [facility] elevated and only assist to manage blankets.  Needed cueing to scoot forward so BLE in contact with floor.  Patient able to squat pivot to chair with increased time and min assist.  Indicated need to void - transported to bathroom for continent BM.  Patient completed modified stand pivot transfer with mod assist and use of grab bar.  Patient then decided she did want to shower and asked to walk to shower seat from toilet.  Walked with mod assist to facilitate weight shift and LLE activation.  Patient with motor impersistence LLE, and poor motor control.  Patient sat to shower, and needed assistance due to time constraints.  Patient moving quickly at times - banged right knee under grab bar when trying to prop leg on wall.  Transferred to left with increased assistance and cueing.    Completed dressing and hygiene at sink.  Patient reports lacking energy once "headache medicine kicked in."  Noted slurred words, less responsive, more slumped posture - although headache completely gone.  Patient returned to bed at end of session due to these symptoms.  Bed alarm engaged and personal items in reach.    Therapy  Documentation Precautions:  Precautions Precautions: Fall Precaution/Restrictions Comments: L hemi Restrictions Weight Bearing Restrictions Per Provider Order: No    Pain: Pain Assessment Pain Scale: 0-10 Pain Score: 3 8/10 headache - called for pain medicine and checked BP     Therapy/Group: Individual Therapy  Collier Salina 02/27/2024, 1:07 PM

## 2024-02-27 NOTE — Progress Notes (Signed)
 Patient denied pain this shift. She has been calm and cooperative with all care. She was incontinent of urine once this shift. She slept well and no concerns this shift.

## 2024-02-27 NOTE — Progress Notes (Addendum)
 Physical Therapy Session Note  Patient Details  Name: Theresa Watkins MRN: [id number] Date of Birth: 1966-08-23  Today's Date: 02/27/2024 PT Individual Time: 4098-1191 PT Individual Time Calculation (min): 50 min   Short Term Goals: Week 3:  PT Short Term Goal 1 (Week 3): pt will initiate stair training PT Short Term Goal 2 (Week 3): pt will ambulate 100 ft with assistance PT Short Term Goal 3 (Week 3): Pt will be able to recall steps and direct transfer safely  Skilled Therapeutic Interventions/Progress Updates: Pt presents semi-reclined in bed and able to participate w/ therapy in limited capacity 2/2 c/o migraine and light disturbance.  Pt requires increased time for speech and does do a lot of spelling out words.  Pt performed sup to sit w/ CGA and use of siderail.  Pt scoots to EOB w/ cueing.  Total A for slipper socks w/ shoes and then CGA for cues for hand placement for squat pivot to Right.  Pt reaches for arm rest of w/c but w/c moves.  Cued for placement on seat and then using arm rest to complete transfer.  Pt requires cues for sit to stand to re-position to center of seat.  Pt negotiated w/c to dayroom but c/o light phobia, so PT completed to small gym an dimmed lights for comfort.  Pt states still w/ visual auras so PT confined session to transfers, NMR in place.  Pt performed standing marches to RLE w/ blocking and cues for LLE weight-bearing.  Pt performed using R hand rail.  Pt then moved to top of ramp for use of BUES on railing, hand over hand for proper placement of L hand.  Pt performed partial squats w/ cueing.  Pt c/o need to return to room 2/2 pain.  Pt transferred squat pivot to L side w/ min/CGA and increased cues for sequencing.  Pt returned to L sidelying w/ CGA and cues.  Bed alarm on and all needs in reach.  Missed time of 25' 2/2 migraine.     Therapy Documentation Precautions:  Precautions Precautions: Fall Precaution/Restrictions Comments: L  hemi Restrictions Weight Bearing Restrictions Per Provider Order: No General: PT Amount of Missed Time (min): 25 Minutes PT Missed Treatment Reason: Patient ill (Comment) (migraine) Vital Signs:   Pain:NR but continued HA and increases w/ light. Pain Assessment Pain Scale: 0-10 Pain Score: 8  Pain Location: Head Pain Intervention(s): Pain med given for lower pain score than stated, per patient request     Therapy/Group: Individual Therapy  Add Dinapoli P Jennett Tarbell 02/27/2024, 10:42 AM

## 2024-02-27 NOTE — Progress Notes (Signed)
 Recreational Therapy Session Note  Patient Details  Name: Theresa Watkins MRN: [id number] Date of Birth: 09-22-1966 Today's Date: 02/27/2024  Pain: no c/o Skilled Therapeutic Interventions/Progress Updates: Pt in bed upon arrival stating that she had been medicated for HA.  Pt reports that she feels like bouncing off the wall and wanted to play basketball, although she doesn't know how to play.  Pt more difficult to understand- less intelligeable today.  Pt reports its a result of the medication she received.  States she wasn't able to complete all or previous therapy sessions but would like to get into w/c brief.  Pt performed bed mobility with min assist and squat pivot transfer with mod assist.  Pt retuned to bed per her request after sitting up ~10 minutes with mod assist.  Therapy/Group: Individual Therapy  Cythina Mickelsen 02/27/2024, 9:26 AM

## 2024-02-27 NOTE — Progress Notes (Signed)
 Speech Language Pathology Daily Session Note  Patient Details  Name: Theresa Watkins MRN: [id number] Date of Birth: Apr 19, 1966  Today's Date: 02/27/2024 SLP Individual Time: 1350-1445 SLP Individual Time Calculation (min): 55 min  Short Term Goals: Week 3: SLP Short Term Goal 1 (Week 3): Patient will consume least restrictive diet with use of compensatory strategies given supervision multimodal A SLP Short Term Goal 2 (Week 3): Patient will increase speech intelligibility to 90% at the sentence level given min multimodal A  Skilled Therapeutic Interventions: SLP conducted skilled therapy session targeting communication and swallowing goals. Patient agreeable to all therapy tasks. Began with assessing diet tolerance with dessert item from meal tray. Patient consumed texture without difficulty. No overt s/sx of penetration/aspiration exhibited nor reported by patient. Patient endorses tolerance of all meal items across breakfast and lunch. Recommend continuation of Dys3/thin liquid diet. Targeted utilization of speech intelligibility strategies. Patient independently utilized slow rate strategy and benefited from supervision to min assist for loud and overarticulation strategies. Achieved ~90% intelligibility across utterance lengths. During session, patient requested assistance to commode. Located nursing staff and assisted with transfer to commode with Stedy +2 per safety plan recommendations. Patient continent of bowel and bladder. In remaining minutes of session, completed 3 rounds of 5 repetitions EMST at 35 cmH2O. Benefited from min cues for accurate execution. Patient was left in room with call bell in reach and alarm set. SLP will continue to target goals per plan of care.        Pain Pain Assessment Pain Scale: 0-10 Pain Score: 0-No pain  Therapy/Group: Individual Therapy  Jeannie Done, M.A., CCC-SLP  Yetta Barre 02/27/2024, 2:48 PM

## 2024-02-28 DIAGNOSIS — I63511 Cerebral infarction due to unspecified occlusion or stenosis of right middle cerebral artery: Secondary | ICD-10-CM | POA: Diagnosis not present

## 2024-02-28 NOTE — Progress Notes (Signed)
 Occupational Therapy Note  Patient Details  Name: Theresa Watkins MRN: [id number] Date of Birth: [birth year]  Today's Date: 02/28/2024 OT Missed Time: 45 Minutes (Patient indicated that she wasn't up to participating secondary to migraine headache.) Missed Time Reason: Pain     Moises Ang 02/28/2024, 3:58 PM

## 2024-02-28 NOTE — Progress Notes (Signed)
 PROGRESS NOTE   Subjective/Complaints: Fioricet caused cognitive impairment, d/ced No other complaints No other issues overnight VSS  ROS:   Pt denies SOB, abd pain, CP, N/V/C/D, and vision changes   Objective:   No results found.   No results for input(s): "WBC", "HGB", "HCT", "PLT" in the last 72 hours.   No results for input(s): "NA", "K", "CL", "CO2", "GLUCOSE", "BUN", "CREATININE", "CALCIUM" in the last 72 hours.    Intake/Output Summary (Last 24 hours) at 02/28/2024 1456 Last data filed at 02/28/2024 1400 Gross per 24 hour  Intake 354 ml  Output --  Net 354 ml         Physical Exam: Vital Signs Blood pressure 117/80, pulse 70, temperature 98.1 F (36.7 C), resp. rate 20, height 5\' 6"  (1.676 m), weight 64.4 kg, SpO2 93%.  General: awake, alert, appropriate, just woke her up- slightly sleepy; NAD HENT: conjugate gaze; oropharynx moist CV: regular rhythm, bradycardic rate; no JVD Pulmonary: CTA B/L; no W/R/R- good air movement GI: soft, NT, ND, (+)BS Psychiatric: appropriate- sleepy- but interactive- brighter affect Neurological: more alert, but sleepy- still significant dysarthria   MAS of 1+ in L shoulder and 1 in L elbow- none in L wrist or fingers so far- Hoffman's (+) in LUE- no change today since meds have worn off overnight  Moderately severe dysarthria- slightly better, but esp has problems with P's this AM; slightly better-   Strength exam- LUE ~3/5- is better  LLE 3+ to 4-/5  Skin- L PIP of 2nd digit- has very large scab vs plaque that's amost 1 inch long and encompasses front and sides of PIP- flesh colored, but concern for basal cell Cancer  Neurologic: Cranial nerves II through XII intact, motor strength is 5/5 in right , 2-/5 left  deltoid, bicep, tricep, grip, 5/5 Right, 3/5 left hip flexor, knee extensors, 5/5 RIght , 2- left ankle dorsiflexor and plantar flexor  Sensory exam normal  sensation to light touch in right upper and lower extremities, feels pinch LLE , no sensation to pinch in left fingers Cerebellar exam normal finger to nose to finger RIght , unable to perform on left due to weakness Musculoskeletal:  + Pain and limited ROM in L hip internal >> extenal rotation; no deformity or crepitus.  FABER, FADIR with groin pain. Stable 4/12     Assessment/Plan: 1. Functional deficits which require 3+ hours per day of interdisciplinary therapy in a comprehensive inpatient rehab setting. Physiatrist is providing close team supervision and 24 hour management of active medical problems listed below. Physiatrist and rehab team continue to assess barriers to discharge/monitor patient progress toward functional and medical goals  Care Tool:  Bathing    Body parts bathed by patient: Face, Left arm, Chest, Abdomen, Front perineal area, Right upper leg, Left upper leg, Right lower leg   Body parts bathed by helper: Right arm, Buttocks, Left lower leg     Bathing assist Assist Level: Moderate Assistance - Patient 50 - 74%     Upper Body Dressing/Undressing Upper body dressing   What is the patient wearing?: Pull over shirt    Upper body assist Assist Level: Moderate Assistance - Patient  50 - 74%    Lower Body Dressing/Undressing Lower body dressing      What is the patient wearing?: Pants     Lower body assist Assist for lower body dressing: Maximal Assistance - Patient 25 - 49%     Toileting Toileting    Toileting assist Assist for toileting: Total Assistance - Patient < 25%     Transfers Chair/bed transfer  Transfers assist     Chair/bed transfer assist level: Contact Guard/Touching assist     Locomotion Ambulation   Ambulation assist      Assist level: 2 helpers Assistive device: Other (comment) (R hand rail) Max distance: 60   Walk 10 feet activity   Assist     Assist level: 2 helpers Assistive device: Other (comment) (R hand  rail)   Walk 50 feet activity   Assist Walk 50 feet with 2 turns activity did not occur: Safety/medical concerns (L hemiplegia and pt fatigue)  Assist level: 2 helpers      Walk 150 feet activity   Assist Walk 150 feet activity did not occur: Safety/medical concerns (L hemiplegia and pt fatigue)         Walk 10 feet on uneven surface  activity   Assist Walk 10 feet on uneven surfaces activity did not occur: Safety/medical concerns (L hemiplegia and pt fatigue)         Wheelchair     Assist Is the patient using a wheelchair?: Yes Type of Wheelchair: Manual    Wheelchair assist level: Supervision/Verbal cueing Max wheelchair distance: 150    Wheelchair 50 feet with 2 turns activity    Assist        Assist Level: Supervision/Verbal cueing   Wheelchair 150 feet activity     Assist      Assist Level: Supervision/Verbal cueing   Blood pressure 117/80, pulse 70, temperature 98.1 F (36.7 C), resp. rate 20, height 5\' 6"  (1.676 m), weight 64.4 kg, SpO2 93%.   Medical Problem List and Plan: 1. 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  Antithrombotics: -DVT/anticoagulation:  Pharmaceutical: Lovenox             -antiplatelet therapy: Aspirin 81 mg daily and Plavix 75 mg daily x 3 weeks then Plavix alone 3. Pain Management: Tylenol as needed  3/25- denies pain- con't regimen rpn   3-30: Ongoing left hip pain; Voltaren gel and Tylenol.  3/31- will start Vit D and Ca per pt request- has helped hip pain in past?  4/1- pt insistent was very helpful already for hip pain  4/2- pt wants at the same  time- per chart, Ca and Vit D come at same time  4/3- having new burning pain in LUE- which is seen sometimes with stroke- will add Duloxetine 30 mg daily for nerve pain- monitor for side effects.  -if gets nausea, will change to Lyrica 4/4-6 no side effects so far-  4/8- will increase Duloxetine to 60 mg daily  4/11- NO side effects of Duloxetine, but mood appears  slightly brighter 4. Mood/Behavior/Sleep: Provide emotional support  4/10- mood slightly better today  4/11- mood also looked slightly better this AM             -antipsychotic agents: N/A 5. Neuropsych/cognition: This patient is capable of making decisions on her own behalf. 6. Skin/Wound Care: Routine skin checks 7. Fluids/Electrolytes/Nutrition: Routine and outs with follow-up chemistries 8.  Dysphagia.  Dysphagia #2 thin liquids.  Follow-up speech therapy  3/28- no change in diet so far  3/31- Pt doesn't need provale cup- will let staff know  4/1- d/w SLP- can drink normally  4.2- pt wants ot upgrade diet- explained she's not ready and could get pneumonia if update too early  4/4.  She was started on D3 thin diet yesterday, continue to monitor  4/6  Eating 50-100% of meals 9.  Hyperlipidemia.  Crestor 10.  Recent History of cocaine/tobacco use.  UDS positive for cocaine.  Provide counseling, this has been a chronic issue , seen in PCP notes 11.  Hypothyroidism.  Synthroid 12.  Constipation.  MiraLAX daily, Senokot-S 1 tab twice daily   3/25- LBM last night after 10 days with no BM  4/7- LBM yesterday 13. Dysarthria- hard to understand- SLP ordered  4/3- Was worse this AM- will monitor for progress  4/4- slightly better today- can understand after 1-3 repetitions- low threshold to recheck Brain MRI  4/6 appears stable/vs a little improved  based on description from prior days  4/7- still somewhat hard to understand but getting back to where it was when came in.   4/10- more problems with P's than anything other  letter 14. Polycythemia  3/25- Hb 16.1- and BUN up to 18, so is a little dry- BUN was 9 prior.   3/26- will recheck labs Thursday  3/27- Back to 14.9 after drinking more  3/31- will check labs today 15. Blurry vision  3/27- will need Ophtho after d/c.   3-29: Seems to mostly be from the left eye, with difficulty converging and tracking; messaged OT and PT to trial taping versus alternating eye patch to help with convergence.  3-30: Reporting improvement with use of taping  4/4- pt reports everything blurry- cannot see faces as a result- never had eye checked in past- used readers but cannot see at a distance since stroke- will send to Ophtho after d/c.   4/7- d/w pt again- she wants eyeglasses- explained we cannot get when in hospital- also per guidelines, since vision can still change, to not get for 3 months after stroke. She disbelieves it's from stroke, since had 2 prior- that didn't cause vision changes- we educated pt pn how this can occur.   16.  Left hip pain.  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 C/O feeling dry  4/1- Labs look good- BUN 15 and Cr 0.69  4/11- will recheck labs Monday  18. Cough  4/1- didn't hear the cough- sounds clear- if continues, will treat symptomatically.   4/2- pt feels like "coming down with something"- because her back is sore and was "freezing" last night- is 69 degrees in room  19. Severe HA/increased dysarthria  4/3- pt had severe HA this AM as well as increased dysarthria/difficulty speaking- no additional Neuro Sx's, but hadn't been having HA's-ordered STAT CT because  my concern for possible new CVA/extension of stroke- Head CT (-)- Sx's are improving and given Fioricet as needed for HA- if continues today, will get MRI, but head CT shows no new CVA. 4/4- HA  mild this AM- have a low threshold for doing MRI of brain since dysarthria slightly improved, but still severe- if any other Neuro Sx's this weekend, please get MRI of brain.  4/5-6 stable, headache improved, continue to monitor.  Continues to have severe dysarthria but can understand her for the most part  4/7- Pt reports daily HA's since last week- she feels it's due to blurry vision, but having daily- is 8/10 this AM- will add Topamax 50 mg at bedtime for HA prevention.   D/c Fioricet since was impacting cognition as per patient  20. Decreased safety awareness/impulsiveness  4/8- d/w pt as well as mother that pt would benefit from slowing down when she attempts function in therapy and with nursing  21. Depression  Continue Duloxetine  22. Spasticity  4/10- educated pt some on spasticity- that will increase up to 1 year- and will start Baclofen 5 mg TID and monitor for side effects- to help spasticity/also d/w OT about ROM  Continue baclofen   23. 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 .     LOS: 19 days A FACE TO FACE EVALUATION WAS PERFORMED  Theresa Watkins Theresa Watkins 02/28/2024, 2:56 PM

## 2024-02-28 NOTE — Progress Notes (Signed)
 Pt has has not coughing noted this shift. She swallowed all medications, liquids and foods with no difficulty this shift. Her lungs remain clear. Her family was at bedside at beginning of shift. They had concerns that pt told them she was "bonkers" today after taking headache medicine. I reviewed meds and explained she took Fioricet this morning for headache. Patient states she does not want this medication again because it made her feel terrible confused. I agreed to pass this on in a note and to day shift to share with provider.  No other complaints or concerns.

## 2024-02-28 NOTE — Progress Notes (Signed)
 Physical Therapy Session Note  Patient Details  Name: Theresa Watkins MRN: [id number] Date of Birth: [birth year]  Today's Date: 02/28/2024 PT Individual Time: 0925-0935 PT Individual Time Calculation (min): 10 min   Short Term Goals: Week 3:  PT Short Term Goal 1 (Week 3): pt will initiate stair training PT Short Term Goal 2 (Week 3): pt will ambulate 100 ft with assistance PT Short Term Goal 3 (Week 3): Pt will be able to recall steps and direct transfer safely  Skilled Therapeutic Interventions/Progress Updates:    Pt seen at scheduled time and reports migraine. Educated on importance of therapy and offered toileting, repositioning, etc, but pt declined. Nsg made aware of pt reporting migraine and request for medication. Pt left with lights dimmed and in comfortable position, was left with all needs in reach and alarm active. Pt missed x 35 min of therapy d/t migraine will attempt to make up as able.   Therapy Documentation Precautions:  Precautions Precautions: Fall Precaution/Restrictions Comments: L hemi Restrictions Weight Bearing Restrictions Per Provider Order: No General: PT Amount of Missed Time (min): 35 Minutes PT Missed Treatment Reason: Patient ill (Comment) (migraine)     Therapy/Group: Individual Therapy  Tex Filbert 02/28/2024, 3:55 PM

## 2024-02-29 DIAGNOSIS — I63511 Cerebral infarction due to unspecified occlusion or stenosis of right middle cerebral artery: Secondary | ICD-10-CM | POA: Diagnosis not present

## 2024-02-29 NOTE — Progress Notes (Signed)
 PROGRESS NOTE   Subjective/Complaints: No new complaints this morning Somnolent Nursing reports she continues to refuse nystatin and miralax, discussed that we can d/c  ROS:   Pt denies SOB, abd pain, CP, N/V/C/D, and vision changes   Objective:   No results found.   No results for input(s): "WBC", "HGB", "HCT", "PLT" in the last 72 hours.   No results for input(s): "NA", "K", "CL", "CO2", "GLUCOSE", "BUN", "CREATININE", "CALCIUM" in the last 72 hours.    Intake/Output Summary (Last 24 hours) at 02/29/2024 1604 Last data filed at 02/29/2024 1251 Gross per 24 hour  Intake 370 ml  Output --  Net 370 ml         Physical Exam: Vital Signs Blood pressure 101/65, pulse 61, temperature (!) 97.3 F (36.3 C), resp. rate 16, height 5\' 6"  (1.676 m), weight 64.4 kg, SpO2 96%.  General: awake, alert, appropriate, just woke her up- slightly sleepy; NAD HENT: conjugate gaze; oropharynx moist CV: regular rhythm, bradycardic rate; no JVD Pulmonary: CTA B/L; no W/R/R- good air movement GI: soft, NT, ND, (+)BS Psychiatric: appropriate- sleepy- but interactive- brighter affect Neurological: more alert, but sleepy- still significant dysarthria   MAS of 1+ in L shoulder and 1 in L elbow- none in L wrist or fingers so far- Hoffman's (+) in LUE- no change today since meds have worn off overnight  Moderately severe dysarthria- slightly better, but esp has problems with P's this AM; slightly better-   Strength exam- LUE ~3/5- is better  LLE 3+ to 4-/5  Skin- L PIP of 2nd digit- has very large scab vs plaque that's amost 1 inch long and encompasses front and sides of PIP- flesh colored, but concern for basal cell Cancer  Neurologic: Cranial nerves II through XII intact, motor strength is 5/5 in right , 2-/5 left  deltoid, bicep, tricep, grip, 5/5 Right, 3/5 left hip flexor, knee extensors, 5/5 RIght , 2- left ankle dorsiflexor and  plantar flexor  Sensory exam normal sensation to light touch in right upper and lower extremities, feels pinch LLE , no sensation to pinch in left fingers Cerebellar exam normal finger to nose to finger RIght , unable to perform on left due to weakness Musculoskeletal:  + Pain and limited ROM in L hip internal >> extenal rotation; no deformity or crepitus.  FABER, FADIR with groin pain. Stable 4/13     Assessment/Plan: 1. Functional deficits which require 3+ hours per day of interdisciplinary therapy in a comprehensive inpatient rehab setting. Physiatrist is providing close team supervision and 24 hour management of active medical problems listed below. Physiatrist and rehab team continue to assess barriers to discharge/monitor patient progress toward functional and medical goals  Care Tool:  Bathing    Body parts bathed by patient: Face, Left arm, Chest, Abdomen, Front perineal area, Right upper leg, Left upper leg, Right lower leg   Body parts bathed by helper: Right arm, Buttocks, Left lower leg     Bathing assist Assist Level: Moderate Assistance - Patient 50 - 74%     Upper Body Dressing/Undressing Upper body dressing   What is the patient wearing?: Pull over shirt    Upper  body assist Assist Level: Moderate Assistance - Patient 50 - 74%    Lower Body Dressing/Undressing Lower body dressing      What is the patient wearing?: Pants     Lower body assist Assist for lower body dressing: Maximal Assistance - Patient 25 - 49%     Toileting Toileting    Toileting assist Assist for toileting: Total Assistance - Patient < 25%     Transfers Chair/bed transfer  Transfers assist     Chair/bed transfer assist level: Contact Guard/Touching assist     Locomotion Ambulation   Ambulation assist      Assist level: 2 helpers Assistive device: Other (comment) (R hand rail) Max distance: 60   Walk 10 feet activity   Assist     Assist level: 2  helpers Assistive device: Other (comment) (R hand rail)   Walk 50 feet activity   Assist Walk 50 feet with 2 turns activity did not occur: Safety/medical concerns (L hemiplegia and pt fatigue)  Assist level: 2 helpers      Walk 150 feet activity   Assist Walk 150 feet activity did not occur: Safety/medical concerns (L hemiplegia and pt fatigue)         Walk 10 feet on uneven surface  activity   Assist Walk 10 feet on uneven surfaces activity did not occur: Safety/medical concerns (L hemiplegia and pt fatigue)         Wheelchair     Assist Is the patient using a wheelchair?: Yes Type of Wheelchair: Manual    Wheelchair assist level: Supervision/Verbal cueing Max wheelchair distance: 150    Wheelchair 50 feet with 2 turns activity    Assist        Assist Level: Supervision/Verbal cueing   Wheelchair 150 feet activity     Assist      Assist Level: Supervision/Verbal cueing   Blood pressure 101/65, pulse 61, temperature (!) 97.3 F (36.3 C), resp. rate 16, height 5\' 6"  (1.676 m), weight 64.4 kg, SpO2 96%.   Medical Problem List and Plan: 1. 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  Antithrombotics: -DVT/anticoagulation:  Pharmaceutical: Lovenox             -antiplatelet therapy: Aspirin 81 mg daily and Plavix 75 mg daily x 3 weeks then Plavix alone 3. Pain Management: Tylenol as needed  3/25- denies pain- con't regimen rpn   3-30: Ongoing left hip pain; Voltaren gel and Tylenol.  3/31- will start Vit D and Ca per pt request- has helped hip pain in past?  4/1- pt insistent was very  helpful already for hip pain  4/2- pt wants at the same time- per chart, Ca and Vit D come at same time  4/3- having new burning pain in LUE- which is seen sometimes with stroke- will add Duloxetine 30 mg daily for nerve pain- monitor for side effects.  -if gets nausea, will change to Lyrica 4/4-6 no side effects so far-  4/8- will increase Duloxetine to 60 mg daily  4/11- NO side effects of Duloxetine, but mood appears slightly brighter 4. Mood/Behavior/Sleep: Provide emotional support  4/10- mood slightly better today  4/11- mood also looked slightly better this AM             -antipsychotic agents: N/A 5. Neuropsych/cognition: This patient is capable of making decisions on her own behalf. 6. Skin/Wound Care: Routine skin checks 7. Fluids/Electrolytes/Nutrition: Routine and outs with follow-up chemistries 8.  Dysphagia.  Dysphagia #2 thin liquids.  Follow-up speech therapy  3/28- no change in diet so far  3/31- Pt doesn't need provale cup- will let staff know  4/1- d/w SLP- can drink normally  4.2- pt wants ot upgrade diet- explained she's not ready and could get pneumonia if update too early  4/4.  She was started on D3 thin diet yesterday, continue to monitor  4/6  Eating 50-100% of meals 9.  Hyperlipidemia.  Crestor 10.  Recent History of cocaine/tobacco use.  UDS positive for cocaine.  Provide counseling, this has been a chronic issue , seen in PCP notes 11.  Hypothyroidism. continue Synthroid  12.  Constipation. D/c mirarlax, continue Senokot-S 1 tab twice daily   3/25- LBM last night after 10 days with no BM  4/7- LBM yesterday 13. Dysarthria- hard to understand- SLP ordered  4/3- Was worse this AM- will monitor for progress  4/4- slightly better today- can understand after 1-3 repetitions- low threshold to recheck Brain MRI  4/6 appears stable/vs a little improved  based on description from prior days  4/7- still somewhat hard to understand but getting back to where it was  when came in.   4/10- more problems with P's than anything other letter 14. Polycythemia  3/25- Hb 16.1- and BUN up to 18, so is a little dry- BUN was 9 prior.   3/26- will recheck labs Thursday  3/27- Back to 14.9 after drinking more  3/31- will check labs today 15. Blurry vision  3/27- will need Ophtho after d/c.   3-29: Seems to mostly be from the left eye, with difficulty converging and tracking; messaged OT and PT to trial taping versus alternating eye patch to help with convergence.  3-30: Reporting improvement with use of taping  4/4- pt reports everything blurry- cannot see faces as a result- never had eye checked in past- used readers but cannot see at a distance since stroke- will send to Ophtho after d/c.   4/7- d/w pt again- she wants eyeglasses- explained we cannot get when in hospital- also per guidelines, since vision can still change, to not get for 3 months after stroke. She disbelieves it's from stroke, since had 2 prior- that didn't cause vision changes- we educated pt pn how this can occur.   16.  Left hip pain.  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 C/O feeling dry  4/1- Labs look good- BUN 15 and Cr 0.69  4/11- will recheck labs Monday  18. Cough  4/1- didn't hear the cough- sounds clear- if continues, will treat symptomatically.   4/2- pt feels like "coming down with something"- because her back is sore and was "freezing" last night- is 69 degrees in room  19. Severe HA/increased dysarthria  4/3- pt had severe HA this AM as well as increased dysarthria/difficulty speaking- no additional  Neuro Sx's, but hadn't been having HA's-ordered STAT CT because my concern for possible new CVA/extension of stroke- Head CT (-)- Sx's are improving and given Fioricet as needed for HA- if  continues today, will get MRI, but head CT shows no new CVA. 4/4- HA mild this AM- have a low threshold for doing MRI of brain since dysarthria slightly improved, but still severe- if any other Neuro Sx's this weekend, please get MRI of brain.  4/5-6 stable, headache improved, continue to monitor.  Continues to have severe dysarthria but can understand her for the most part  4/7- Pt reports daily HA's since last week- she feels it's due to blurry vision, but having daily- is 8/10 this AM- will add Topamax 50 mg at bedtime for HA prevention.   D/c Fioricet since was impacting cognition as per patient  20. Decreased safety awareness/impulsiveness  4/8- d/w pt as well as mother that pt would benefit from slowing down when she attempts function in therapy and with nursing  21. Depression  Continue Duloxetine  22. Spasticity  4/10- educated pt some on spasticity- that will increase up to 1 year- and will start Baclofen 5 mg TID and monitor for side effects- to help spasticity/also d/w OT about ROM  continue baclofen   23. 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 .     LOS: 20 days A FACE TO FACE EVALUATION WAS PERFORMED  Lavell Portugal P Ines Warf 02/29/2024, 4:04 PM

## 2024-02-29 NOTE — Progress Notes (Signed)
 Physical Therapy Note  Patient Details  Name: Theresa Watkins MRN: [id number] Date of Birth: Mar 09, 1966 Today's Date: 02/29/2024    PT attempted x 2.  Pt states pain from migraine HA as well as aspirating eating earlier and unable to participate.  PT returned and pt received pain meds and "about to go out".     Lesslie Mckeehan P Jerrilyn Messinger 02/29/2024, 12:21 PM

## 2024-03-01 DIAGNOSIS — M792 Neuralgia and neuritis, unspecified: Secondary | ICD-10-CM | POA: Diagnosis not present

## 2024-03-01 DIAGNOSIS — F329 Major depressive disorder, single episode, unspecified: Secondary | ICD-10-CM

## 2024-03-01 DIAGNOSIS — R509 Fever, unspecified: Secondary | ICD-10-CM | POA: Diagnosis not present

## 2024-03-01 DIAGNOSIS — I69391 Dysphagia following cerebral infarction: Secondary | ICD-10-CM

## 2024-03-01 DIAGNOSIS — I63511 Cerebral infarction due to unspecified occlusion or stenosis of right middle cerebral artery: Secondary | ICD-10-CM | POA: Diagnosis not present

## 2024-03-01 DIAGNOSIS — F1910 Other psychoactive substance abuse, uncomplicated: Secondary | ICD-10-CM

## 2024-03-01 LAB — CBC WITH DIFFERENTIAL/PLATELET
Abs Immature Granulocytes: 0.02 10*3/uL (ref 0.00–0.07)
Basophils Absolute: 0 10*3/uL (ref 0.0–0.1)
Basophils Relative: 0 %
Eosinophils Absolute: 0.1 10*3/uL (ref 0.0–0.5)
Eosinophils Relative: 2 %
HCT: 44.8 % (ref 36.0–46.0)
Hemoglobin: 15 g/dL (ref 12.0–15.0)
Immature Granulocytes: 0 %
Lymphocytes Relative: 35 %
Lymphs Abs: 1.7 10*3/uL (ref 0.7–4.0)
MCH: 28.7 pg (ref 26.0–34.0)
MCHC: 33.5 g/dL (ref 30.0–36.0)
MCV: 85.8 fL (ref 80.0–100.0)
Monocytes Absolute: 0.4 10*3/uL (ref 0.1–1.0)
Monocytes Relative: 8 %
Neutro Abs: 2.7 10*3/uL (ref 1.7–7.7)
Neutrophils Relative %: 55 %
Platelets: 169 10*3/uL (ref 150–400)
RBC: 5.22 MIL/uL — ABNORMAL HIGH (ref 3.87–5.11)
RDW: 13.4 % (ref 11.5–15.5)
WBC: 5 10*3/uL (ref 4.0–10.5)
nRBC: 0 % (ref 0.0–0.2)

## 2024-03-01 LAB — COMPREHENSIVE METABOLIC PANEL WITH GFR
ALT: 26 U/L (ref 0–44)
AST: 17 U/L (ref 15–41)
Albumin: 3.5 g/dL (ref 3.5–5.0)
Alkaline Phosphatase: 53 U/L (ref 38–126)
Anion gap: 9 (ref 5–15)
BUN: 15 mg/dL (ref 6–20)
CO2: 23 mmol/L (ref 22–32)
Calcium: 8.8 mg/dL — ABNORMAL LOW (ref 8.9–10.3)
Chloride: 107 mmol/L (ref 98–111)
Creatinine, Ser: 0.75 mg/dL (ref 0.44–1.00)
GFR, Estimated: >60 mL/min (ref >60)
Glucose, Bld: 83 mg/dL (ref 70–99)
Potassium: 3.9 mmol/L (ref 3.5–5.1)
Sodium: 139 mmol/L (ref 135–145)
Total Bilirubin: 0.4 mg/dL (ref 0.0–1.2)
Total Protein: 6.4 g/dL — ABNORMAL LOW (ref 6.5–8.1)

## 2024-03-01 NOTE — Progress Notes (Signed)
 PROGRESS NOTE   Subjective/Complaints: Pt awake in bed. Said she had a fever. Temp was 99 this AM. Denies any chills, cough, dysuria. Asked how I thought her left side was doing  ROS: Patient denies rash, sore throat, blurred vision, dizziness, nausea, vomiting, diarrhea, cough, shortness of breath or chest pain, joint or back/neck pain, headache, or mood change.    Objective:   No results found.   Recent Labs    03/01/24 0536  WBC 5.0  HGB 15.0  HCT 44.8  PLT 169     Recent Labs    03/01/24 0536  NA 139  K 3.9  CL 107  CO2 23  GLUCOSE 83  BUN 15  CREATININE 0.75  CALCIUM 8.8*      Intake/Output Summary (Last 24 hours) at 03/01/2024 1032 Last data filed at 03/01/2024 4010 Gross per 24 hour  Intake 440 ml  Output --  Net 440 ml         Physical Exam: Vital Signs Blood pressure 105/68, pulse 66, temperature 99 F (37.2 C), resp. rate 17, height 5\' 6"  (1.676 m), weight 64.4 kg, SpO2 95%.  Constitutional: No distress . Vital signs reviewed. HEENT: NCAT, EOMI, oral membranes moist Neck: supple Cardiovascular: RRR without murmur. No JVD    Respiratory/Chest: CTA Bilaterally without wheezes or rales. Normal effort    GI/Abdomen: BS +, non-tender, non-distended Ext: no clubbing, cyanosis, or edema Psych: pleasant and cooperative  Neurological: more alert, but sleepy- still significant dysarthria   MAS of 1  in L shoulder and 1 in L elbow- none in L wrist or fingers so far- Hoffman's (+) in LUE- no change today since meds have worn off overnight  Mild to mod dysarthria.  Left central 7  Strength exam- LUE 2- to 25/   LLE 3 to 3+/5.  -sensory exam normal.  Skin- L PIP of 2nd digit- has very large scab vs plaque that's amost 1 inch long and encompasses front and sides of PIP- flesh colored, but concern for basal cell Cancer--unchanged 4/14  Musculoskeletal:  + Pain and limited ROM in L hip internal >>  extenal rotation; no deformity or crepitus.  FABER, FADIR with groin pain. Stable 4/14     Assessment/Plan: 1. Functional deficits which require 3+ hours per day of interdisciplinary therapy in a comprehensive inpatient rehab setting. Physiatrist is providing close team supervision and 24 hour management of active medical problems listed below. Physiatrist and rehab team continue to assess barriers to discharge/monitor patient progress toward functional and medical goals  Care Tool:  Bathing    Body parts bathed by patient: Face, Left arm, Chest, Abdomen, Front perineal area, Right upper leg, Left upper leg, Right lower leg   Body parts bathed by helper: Right arm, Buttocks, Left lower leg     Bathing assist Assist Level: Moderate Assistance - Patient 50 - 74%     Upper Body Dressing/Undressing Upper body dressing   What is the patient wearing?: Pull over shirt    Upper body assist Assist Level: Moderate Assistance - Patient 50 - 74%    Lower Body Dressing/Undressing Lower body dressing      What is the  patient wearing?: Pants     Lower body assist Assist for lower body dressing: Maximal Assistance - Patient 25 - 49%     Toileting Toileting    Toileting assist Assist for toileting: Total Assistance - Patient < 25%     Transfers Chair/bed transfer  Transfers assist     Chair/bed transfer assist level: Contact Guard/Touching assist     Locomotion Ambulation   Ambulation assist      Assist level: 2 helpers Assistive device: Other (comment) (R hand rail) Max distance: 60   Walk 10 feet activity   Assist     Assist level: 2 helpers Assistive device: Other (comment) (R hand rail)   Walk 50 feet activity   Assist Walk 50 feet with 2 turns activity did not occur: Safety/medical concerns (L hemiplegia and pt fatigue)  Assist level: 2 helpers      Walk 150 feet activity   Assist Walk 150 feet activity did not occur: Safety/medical concerns (L  hemiplegia and pt fatigue)         Walk 10 feet on uneven surface  activity   Assist Walk 10 feet on uneven surfaces activity did not occur: Safety/medical concerns (L hemiplegia and pt fatigue)         Wheelchair     Assist Is the patient using a wheelchair?: Yes Type of Wheelchair: Manual    Wheelchair assist level: Supervision/Verbal cueing Max wheelchair distance: 150    Wheelchair 50 feet with 2 turns activity    Assist        Assist Level: Supervision/Verbal cueing   Wheelchair 150 feet activity     Assist      Assist Level: Supervision/Verbal cueing   Blood pressure 105/68, pulse 66, temperature 99 F (37.2 C), resp. rate 17, height 5\' 6"  (1.676 m), weight 64.4 kg, SpO2 95%.   Medical Problem List and Plan: 1. 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  Antithrombotics: -DVT/anticoagulation:  Pharmaceutical: Lovenox             -antiplatelet therapy: Aspirin 81 mg daily and Plavix 75 mg daily x 3 weeks then Plavix alone 3. Pain Management: Tylenol as needed  3/25- denies pain- con't regimen rpn   3-30: Ongoing left hip pain; Voltaren gel and Tylenol.  3/31- will start Vit D and Ca per pt request- has helped hip pain in past?  4/1- pt insistent was very helpful already for hip pain  4/2- pt wants at the same time- per chart, Ca and Vit D come at same time  4/3- having new burning pain in LUE- which is seen sometimes with stroke- will add Duloxetine 30 mg daily for nerve pain- monitor for side effects.  -if gets nausea, will change to Lyrica 4/4-6 no side  effects so far-  4/8- will increase Duloxetine to 60 mg daily  4/14 tolerating duloxetine and meds without an issue 4. Mood/Behavior/Sleep: Provide emotional support  4/10- mood slightly better today  4/11- mood also looked slightly better  this AM             -antipsychotic agents: N/A 5. Neuropsych/cognition: This patient is capable of making decisions on her own behalf. 6. Skin/Wound Care: Routine skin checks 7. Fluids/Electrolytes/Nutrition: Routine and outs with follow-up chemistries 8.  Dysphagia.  Dysphagia #2 thin liquids.  Follow-up speech therapy  3/28- no change in diet so far  3/31- Pt doesn't need provale cup- will let staff know  4/1- d/w SLP- can drink normally  4.2- pt wants ot upgrade diet- explained she's not ready and could get pneumonia if update too early  4/4.  She was started on D3 thin diet yesterday, continue to monitor  4/14--eatin 25-100% of meals 9.  Hyperlipidemia.  Crestor 10.  Recent History of cocaine/tobacco use.  UDS positive for cocaine.  Provide counseling, this has been a chronic issue , seen in PCP notes 11.  Hypothyroidism. continue Synthroid  12.  Constipation. D/c mirarlax, continue Senokot-S 1 tab twice daily   3/25- LBM last night after 10 days with no BM  4/7- LBM yesterday    3/25- Hb 16.1- and BUN up to 18, so is a little dry- BUN was 9 prior.   3/26- will recheck labs Thursday  3/27- Back to 14.9 after drinking more    15. Blurry vision  3/27- will need Ophtho after d/c.   3-29: Seems to mostly be from the left eye, with difficulty converging and tracking; messaged OT and PT to trial taping versus alternating eye patch to help with convergence.  3-30: Reporting improvement with use of taping  4/4- pt reports everything blurry- cannot see faces as a result- never had eye checked in past- used readers but cannot see at a distance since stroke- will send to Ophtho after d/c.   4/7- d/w pt again- she wants eyeglasses- explained we cannot get  when in hospital- also per guidelines, since vision can still change, to not get for 3 months after stroke. She disbelieves it's from stroke, since had 2 prior- that didn't cause vision changes- we educated pt pn how this can occur.   16.  Left hip pain.  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 C/O feeling dry  4/1- Labs look good- BUN 15 and Cr 0.69  4/11- will recheck labs Monday  4/14 labs stable/wnl 18. Cough  4/1- didn't hear the cough- sounds clear- if continues, will treat symptomatically.   4/2- pt feels like "coming down with something"- because her back is sore and was "freezing" last night- is 69 degrees in room  19. Severe HA/increased dysarthria  4/3- pt had severe HA this AM as well as increased dysarthria/difficulty speaking- no additional Neuro Sx's, but hadn't been having HA's-ordered STAT CT because my concern for possible new CVA/extension of stroke- Head CT (-)- Sx's are improving and given Fioricet as needed for HA- if continues today, will get MRI, but head CT shows no new CVA. 4/4- HA mild this AM- have a low threshold for doing MRI of brain since dysarthria slightly improved, but still severe- if any other Neuro Sx's this weekend, please get MRI of brain.  4/5-6 stable, headache improved, continue to monitor.  Continues to have severe dysarthria but can understand her for the most part  4/7- Pt reports daily HA's since  last week- she feels it's due to blurry vision, but having daily- is 8/10 this AM- will add Topamax 50 mg at bedtime for HA prevention.   D/c Fioricet since was impacting cognition as per patient  20. Decreased safety awareness/impulsiveness  4/8- d/w pt as well as mother that pt would benefit from slowing down when she attempts function in therapy and with  nursing  21. Depression  Continue Duloxetine  22. Spasticity  4/14 tone appears controlled. continue baclofen  23. 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 .   4/14 consult made??  24. Low grade temp. 99 this morning  -no other markers of infection--observe for now   LOS: 21 days A FACE TO FACE EVALUATION WAS PERFORMED  Rawland Caddy 03/01/2024, 10:32 AM

## 2024-03-01 NOTE — Progress Notes (Signed)
 Physical Therapy Session Note  Patient Details  Name: ANORA SCHWENKE MRN: [id number] Date of Birth: [birth year]  Today's Date: 03/01/2024 PT Individual Time: 0930-1000 PT Individual Time Calculation (min): 30 min  and Today's Date: 03/01/2024 PT Missed Time: 30 Minutes Missed Time Reason: Patient ill (Comment);Patient unwilling to participate (nausea)  Short Term Goals: Week 3:  PT Short Term Goal 1 (Week 3): pt will initiate stair training PT Short Term Goal 2 (Week 3): pt will ambulate 100 ft with assistance PT Short Term Goal 3 (Week 3): Pt will be able to recall steps and direct transfer safely  Skilled Therapeutic Interventions/Progress Updates:    pt received in bed and reports feeling unwell with nausea this am. Pt declines OOB mobility at this time, despite education and encouragement from therapist.   Pt performed the following exercises to promote LE strength and endurance:  2 x glute bridges for improved hip extension  Active ROM hamstring and piriformis stretching and "bicycle legs" to maintain mobility at bed level Lower trunk rotations for digestive help and core strength  Hip/knee extension against yellow T band for quad strength  Pt remained in bed at end of session, was left with all needs in reach and alarm active.   Missed x  30  min d/t nausea and refusal.   Therapy Documentation Precautions:  Precautions Precautions: Fall Precaution/Restrictions Comments: L hemi Restrictions Weight Bearing Restrictions Per Provider Order: No General:       Therapy/Group: Individual Therapy  Tex Filbert 03/01/2024, 9:49 AM

## 2024-03-01 NOTE — Progress Notes (Addendum)
 Speech Language Pathology Daily Session Note  Patient Details  Name: Theresa Watkins MRN: [id number] Date of Birth: 04/16/1966  Today's Date: 03/01/2024 SLP Individual Time: 7829-5621 SLP Individual Time Calculation (min): 55 min  Short Term Goals: Week 3: SLP Short Term Goal 1 (Week 3): Patient will consume least restrictive diet with use of compensatory strategies given supervision multimodal A SLP Short Term Goal 2 (Week 3): Patient will increase speech intelligibility to 90% at the sentence level given min multimodal A  Skilled Therapeutic Interventions:  Session 1: Patient was seen in am to to address dysphagia management and speech intelligibility. Pt was alert and seated upright in bed. Her sister and brother in law present participating throughout session. SLP noted peppermint hard candy at bedside along with pt taking a piece out of her mouth. SLP provided education on current diet recommendations with pt and family verbalized understanding. Subsequently, family noted with questions regarding Dys 3 diet including allowed and foods to limit. Education completed on Dys 3 diet and foods to omit. In other minutes of session, pt able to verbalize speech intelligibility strategies with mod A. Pt communicating at the sentence and conversational level initially maintaining 90% intelligibility which noted to decline across session, suspect fatigue a contributing factor due to pt taking more deep breaths and  greater instances of strained vocal quality. SLP guided pt in completion of EMST. Pt completed 5 sets of 5 at 35cm H2O. SLP attempted to increase intensity during session however pt reported dizziness and 10/10 difficulty. At conclusion of session, pt left upright in bed with call button within reach. SLP to continue POC.   Session 2: Patient was seen in PM to address speech. Pt easily alerted upon SLP arrival however, requiring additional time before achieving full engagement. Pt successfully  recalling speech intelligibility strategies discussed in earlier session with sup A. Pt recounting family compliments on improvement of her speech. SLP challenging pt speech through spontaneous conversation about family. Pt's speech ranging from 80-90% at the conversational level with greater difficulty observed with multisyllabic words. As session progressed, pt noted with mildly decreased intelligibility likely 2/2 fatigue. SLP reeducated patient on vocal rest. Pt left upright in bed with call button within reach and bed alarm active. SLP to continue POC.   Pain Pain Assessment Pain Scale: 0-10 Pain Score: 3  Pain Location: Head Pain Intervention(s): Medication (See eMAR)  Therapy/Group: Individual Therapy  Adela Holter 03/01/2024, 4:19 PM

## 2024-03-01 NOTE — Progress Notes (Signed)
 Occupational Therapy Note  Patient Details  Name: Theresa Watkins MRN: [id number] Date of Birth: 11-07-1966  Today's Date: 03/01/2024 OT Missed Time: 30 Minutes Missed Time Reason: Pain  Pt missed 45 mins skilled OT services 2/2 pt report of migraine and she "couldn't do anything" this afternoon. Will attempt to see pt again as schedule allows.    Andre [facility] 03/01/2024, 1:39 PM

## 2024-03-02 DIAGNOSIS — I63511 Cerebral infarction due to unspecified occlusion or stenosis of right middle cerebral artery: Secondary | ICD-10-CM | POA: Diagnosis not present

## 2024-03-02 DIAGNOSIS — F1910 Other psychoactive substance abuse, uncomplicated: Secondary | ICD-10-CM

## 2024-03-02 DIAGNOSIS — F329 Major depressive disorder, single episode, unspecified: Secondary | ICD-10-CM | POA: Diagnosis not present

## 2024-03-02 DIAGNOSIS — R509 Fever, unspecified: Secondary | ICD-10-CM | POA: Diagnosis not present

## 2024-03-02 DIAGNOSIS — I69391 Dysphagia following cerebral infarction: Secondary | ICD-10-CM | POA: Diagnosis not present

## 2024-03-02 DIAGNOSIS — M792 Neuralgia and neuritis, unspecified: Secondary | ICD-10-CM | POA: Diagnosis not present

## 2024-03-02 NOTE — Progress Notes (Signed)
 Physical Therapy Session Note  Patient Details  Name: Theresa Watkins MRN: [id number] Date of Birth: 10/05/1966  Today's Date: 03/02/2024 PT Individual Time: 1300-1400 PT Individual Time Calculation (min): 60 min   Short Term Goals: Week 3:  PT Short Term Goal 1 (Week 3): pt will initiate stair training PT Short Term Goal 2 (Week 3): pt will ambulate 100 ft with assistance PT Short Term Goal 3 (Week 3): Pt will be able to recall steps and direct transfer safely  Skilled Therapeutic Interventions/Progress Updates:    Pt received in recliner and agreeable to therapy.  Pt reports unrated LUE pain, rest and positioning as needed.   ambulatory transfer to w/c with mod  a and HHA, note continued L knee instability.   Pt transported to therapy gym for time management and energy conservation. Stand pivot transfer to w/c with mod a for safety. Pt utilized pace partner program on nustep x 10 min at level 5 for attention to task, endurance, and LE strength. Pt cued to use visual cues to maintain pace of 80 spm and was able to maintain, but typically maintained 40-50 spm.   Transitioned to Sit to stand and mini squats with hall rail and min a overall x 3 bouts to fatigue (3-5 reps) for strength and endurance. Note L ankle inversion in weightbearing, therapist provided knee block and assist to maintain ankle alignment.   Pt propelled w/c with BLE ~120 ft, and transported remaining distance for time. Stand pivot transfer with bed rail and min a. Pt was left with all needs in reach and alarm active. All 4 rails up at pt request.   Therapy Documentation Precautions:  Precautions Precautions: Fall Precaution/Restrictions Comments: L hemi Restrictions Weight Bearing Restrictions Per Provider Order: No General:   Vital Signs: Therapy Vitals Temp: 98.5 F (36.9 C) Temp Source: Oral Pulse Rate: 66 Resp: 15 BP: 119/77 Patient Position (if appropriate): Sitting Oxygen Therapy SpO2: 99  % Pain:   Mobility:   Locomotion :    Trunk/Postural Assessment :    Balance:   Exercises:   Other Treatments:      Therapy/Group: Individual Therapy  Tex Filbert 03/02/2024, 1:24 PM

## 2024-03-02 NOTE — Consult Note (Signed)
 Neuropsychological Consultation Comprehensive Inpatient Rehab   Patient:   Theresa Watkins   DOB:   12-23-1965  MR Number:  [id number]  Location:  [facility] [address] [place] [address] Dept: [phone] Loc: [phone]           Date of Service:   03/01/2024  Start Time:   10 AM End Time:   11 AM  Provider/Observer:  Arley Phenix, Psy.D.       Clinical Neuropsychologist       Billing Code/Service: [phone]  Reason for Service:    Theresa Watkins is a 58 year old female referred for neuropsychological consultation during her admission to the comprehensive inpatient rehabilitation unit.  Patient has a long history of cocaine/polysubstance abuse and tobacco use.  Past medical history includes hypertension, hyperlipidemia, hypothyroidism.  Patient was using a straight point cane prior to admission.  Patient presented on 02/02/2024 with aphasia as well as left-sided weakness after being found down by her son.  CT/MRI showed several small acute to subacute lacunar infarcts in the right thalamus, posterior limb right external capsule.  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  Patient with decreased functional mobility and left-sided weakness with admission to the comprehensive inpatient rehabilitation unit.  During the clinical visit today, the patient was laying in her bed with little movement.  Patient was engaging but displayed dysarthria and significant expressive language issues.  She was able to adequately express herself or understanding.  Patient admitted history of cocaine use but adamantly claimed that she was no longer going to ever use cocaine again.  Patient notes that she has been using cocaine on a regular/semiregular basis starting  back in the 90s.  Patient aware of substance use and impact on her current as well as past cerebrovascular history.  Patient denied disruptive mood state keeping her from being able to participate in therapeutic interventions.  Today we worked on coping and adjustment issues particularly with significant motor deficits and expressive language deficits.  Patient is improving but having difficulty with word finding and fluency issues.  HPI for the current admission:    HPI: Theresa Watkins is a 58 year old right-handed female with history of hypertension, hyperlipidemia, hypothyroidism, cocaine/polysubstance abuse as well as tobacco use. Per chart review patient lives alone. Reportedly used a straight point cane prior to admission. She does have family in the area. Presented 02/02/2024 with aphasia as well as left-sided weakness after being found down by her son. CT/MRI showed several small acute to subacute lacunar infarcts in the lateral right thalamus, posterior limb right external capsule. No associated hemorrhage or mass effect. Underlying extensive chronic small vessel disease. 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 MRA negative for large vessel occlusion. New since 2023 severe left PCA P2 segment stenosis. 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 Patient did not receive TNK. Admission chemistries unremarkable except potassium 3.4, urine drug screen positive cocaine, hemoglobin A1c 5.8. Echocardiogram ejection fraction of 60 to 65% no wall motion abnormalities. Presently maintained on aspirin 81 mg daily and Plavix 75 mg daily for CVA prophylaxis x 3 weeks then Plavix alone. Lovenox added for DVT prophylaxis. Presently on dysphagia #2 thin liquid diet.  Therapy evaluations completed due to patient decreased functional mobility and left-sided weakness was admitted for a comprehensive  rehab program.   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   TIA (transient ischemic attack) 02/03/2024   Hypothyroidism 02/03/2024   Prediabetes 12/12/2022   Acute CVA (cerebrovascular accident) (HCC) 11/10/2022   Acute ischemic stroke (HCC) 11/09/2022   Chondromalacia patellae, 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   Unilateral primary osteoarthritis, left hip 09/25/2021   Left foot pain 08/26/2019   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   Tobacco dependence 04/09/2019   Vitamin D deficiency 02/18/2018   Toe deformity, acquired, right 02/18/2018   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   Hyperlipemia 03/21/2017   Headache 03/21/2017   Tooth pain    Cerebral infarction (HCC) 12/27/2015   HLD (hyperlipidemia)    Hemiparesis affecting dominant side as late effect of stroke (HCC)    Tobacco abuse    Cocaine use disorder (HCC)    ETOH abuse    Postoperative hypothyroidism 12/24/2015   Drug abuse, cocaine type (HCC) 05/09/2014   Hx of thyroid cancer 01/25/2014   Essential hypertension 06/03/2012    Behavioral Observation/Mental Status:   Theresa Watkins  presents as a 58 y.o.-year-old Right handed African American Female who appeared her stated age. her dress was Appropriate and she was Well Groomed and her manners were Appropriate to the situation.  her participation was indicative of Appropriate and Redirectable behaviors.  There were physical disabilities noted.  she displayed an appropriate level of cooperation and motivation.    Interactions:    Active Inattentive and  Redirectable  Attention:   abnormal and attention span appeared shorter than expected for age  Memory:   abnormal; remote memory intact, recent memory impaired  Visuo-spatial:   not examined  Speech (Volume):  low  Speech:   non-fluent aphasia; slurred  Thought Process:  Circumstantial  Concrete and Coherent  Though Content:  WNL; not suicidal and not homicidal  Orientation:   person, place, and situation  Judgment:   Fair  Planning:   Poor  Affect:    Blunted  Mood:    Dysphoric  Insight:   Shallow  Intelligence:   low  Psychiatric History:  Significant history of polysubstance abuse  History of Substance Use or Abuse:  There is a documented history of alcohol and cocaine abuse confirmed by the patient.    Family Med/Psych History:  Family History  Problem Relation Age of Onset   Bell's palsy Mother    Hypertension Mother    Diabetes Mother    Stroke Father    Prostate cancer Father    Breast cancer Cousin    Hypertension Sister    Colon cancer Maternal Aunt    Colon cancer Paternal Aunt    Diverticulitis Sister    Diabetes Sister     Impression/DX:   Theresa Watkins is a 58 year old female referred for neuropsychological consultation during her admission to the comprehensive inpatient rehabilitation  unit.  Patient has a long history of cocaine/polysubstance abuse and tobacco use.  Past medical history includes hypertension, hyperlipidemia, hypothyroidism.  Patient was using a straight point cane prior to admission.  Patient presented on 02/02/2024 with aphasia as well as left-sided weakness after being found down by her son.  CT/MRI showed several small acute to subacute lacunar infarcts in the right thalamus, posterior limb right external capsule.  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  Patient  with decreased functional mobility and left-sided weakness with admission to the comprehensive inpatient rehabilitation unit.  During the clinical visit today, the patient was laying in her bed with little movement.  Patient was engaging but displayed dysarthria and significant expressive language issues.  She was able to adequately express herself or understanding.  Patient admitted history of cocaine use but adamantly claimed that she was no longer going to ever use cocaine again.  Patient notes that she has been using cocaine on a regular/semiregular basis starting back in the 90s.  Patient aware of substance use and impact on her current as well as past cerebrovascular history.  Patient denied disruptive mood state keeping her from being able to participate in therapeutic interventions.  Today we worked on coping and adjustment issues particularly with significant motor deficits and expressive language deficits.  Patient is improving but having difficulty with word finding and fluency issues.          Electronically Signed   _______________________ Chapman Commodore, Psy.D. Clinical Neuropsychologist

## 2024-03-02 NOTE — Progress Notes (Signed)
 Occupational Therapy Session Note  Patient Details  Name: Theresa Watkins MRN: [id number] Date of Birth: 11/12/1966  Today's Date: 03/02/2024 OT Individual Time: 7829-5621 OT Individual Time Calculation (min): 73 min    Short Term Goals: Week 3:  OT Short Term Goal 1 (Week 3): Pt will complete LB dressing tasks with mod A OT Short Term Goal 2 (Week 3): Pt will complete LB dressing tasks with mod A OT Short Term Goal 3 (Week 3): Pt will utilize LUE as gross assist during funcitonal tasks with mod A  Skilled Therapeutic Interventions/Progress Updates:    Skilled OT intervention with focus on bed mobility, stand pivot/squat pivot transfers, toileting, bathing at shower level, dressing with sit<>stand from w/c, and safety awareness to increase independence with BADLs. Supine>sit EOB with supervision. Squat pivot transfer to w/c with min A. Toilet transfer with min A. Pt amb with HHA to TTB with mod A. Bathing with min A seated on TTB. Max verbal cues for safety and technique. Dressing with sit<>stand from w/c at sink. Mod verbal cues for hemi dressing techniques. Max verbal cues for safety awareness. Pt transferred to recliner and remained in relciner. All needs wihtin reach.   Therapy Documentation Precautions:  Precautions Precautions: Fall Precaution/Restrictions Comments: L hemi Restrictions Weight Bearing Restrictions Per Provider Order: No   Pain:  Pt denies pain this morning  Therapy/Group: Individual Therapy  [name] 03/02/2024, 10:52 AM

## 2024-03-02 NOTE — Progress Notes (Signed)
 PROGRESS NOTE   Subjective/Complaints: Some anxiety last night. May have been because door was closed. Felt that she was having an out of body experience. Pain improved.   ROS: Patient denies fever, rash, sore throat, blurred vision, dizziness, nausea, vomiting, diarrhea, cough, shortness of breath or chest pain, joint or back/neck pain  .    Objective:   No results found.   Recent Labs    03/01/24 0536  WBC 5.0  HGB 15.0  HCT 44.8  PLT 169     Recent Labs    03/01/24 0536  NA 139  K 3.9  CL 107  CO2 23  GLUCOSE 83  BUN 15  CREATININE 0.75  CALCIUM 8.8*      Intake/Output Summary (Last 24 hours) at 03/02/2024 0957 Last data filed at 03/02/2024 0700 Gross per 24 hour  Intake 460 ml  Output --  Net 460 ml         Physical Exam: Vital Signs Blood pressure 116/85, pulse 62, temperature 98.1 F (36.7 C), resp. rate 17, height 5\' 6"  (1.676 m), weight 64.4 kg, SpO2 98%.  Constitutional: No distress . Vital signs reviewed. HEENT: NCAT, EOMI, oral membranes moist Neck: supple Cardiovascular: RRR without murmur. No JVD    Respiratory/Chest: CTA Bilaterally without wheezes or rales. Normal effort    GI/Abdomen: BS +, non-tender, non-distended Ext: no clubbing, cyanosis, or edema Psych: pleasant and cooperative  Neurological: more alert, but sleepy- still significant dysarthria   MAS of 1  in L shoulder and 1 in L elbow- none in L wrist or fingers so far- Hoffman's (+) in LUE- no change today since meds have worn off overnight  Mild to mod dysarthria.  Left central 7  Strength exam- LUE 2- to 2/5  LLE 3 to 3+/5.  -sensory exam normal. Prior neuro assessment is c/w today's exam 03/02/2024.  Skin- L PIP of 2nd digit- has very large scab vs plaque that's amost 1 inch long and encompasses front and sides of PIP- flesh colored, but concern for basal cell Cancer--unchanged 4/15  Musculoskeletal:  + Pain and  limited ROM in L hip internal >> extenal rotation; no deformity or crepitus.  FABER, FADIR with groin pain. Stable 4/15     Assessment/Plan: 1. Functional deficits which require 3+ hours per day of interdisciplinary therapy in a comprehensive inpatient rehab setting. Physiatrist is providing close team supervision and 24 hour management of active medical problems listed below. Physiatrist and rehab team continue to assess barriers to discharge/monitor patient progress toward functional and medical goals  Care Tool:  Bathing    Body parts bathed by patient: Face, Left arm, Chest, Abdomen, Front perineal area, Right upper leg, Left upper leg, Right lower leg   Body parts bathed by helper: Right arm, Buttocks, Left lower leg     Bathing assist Assist Level: Moderate Assistance - Patient 50 - 74%     Upper Body Dressing/Undressing Upper body dressing   What is the patient wearing?: Pull over shirt    Upper body assist Assist Level: Moderate Assistance - Patient 50 - 74%    Lower Body Dressing/Undressing Lower body dressing      What  is the patient wearing?: Pants     Lower body assist Assist for lower body dressing: Maximal Assistance - Patient 25 - 49%     Toileting Toileting    Toileting assist Assist for toileting: Total Assistance - Patient < 25%     Transfers Chair/bed transfer  Transfers assist     Chair/bed transfer assist level: Contact Guard/Touching assist     Locomotion Ambulation   Ambulation assist      Assist level: 2 helpers Assistive device: Other (comment) (R hand rail) Max distance: 60   Walk 10 feet activity   Assist     Assist level: 2 helpers Assistive device: Other (comment) (R hand rail)   Walk 50 feet activity   Assist Walk 50 feet with 2 turns activity did not occur: Safety/medical concerns (L hemiplegia and pt fatigue)  Assist level: 2 helpers      Walk 150 feet activity   Assist Walk 150 feet activity did not  occur: Safety/medical concerns (L hemiplegia and pt fatigue)         Walk 10 feet on uneven surface  activity   Assist Walk 10 feet on uneven surfaces activity did not occur: Safety/medical concerns (L hemiplegia and pt fatigue)         Wheelchair     Assist Is the patient using a wheelchair?: Yes Type of Wheelchair: Manual    Wheelchair assist level: Supervision/Verbal cueing Max wheelchair distance: 150    Wheelchair 50 feet with 2 turns activity    Assist        Assist Level: Supervision/Verbal cueing   Wheelchair 150 feet activity     Assist      Assist Level: Supervision/Verbal cueing   Blood pressure 116/85, pulse 62, temperature 98.1 F (36.7 C), resp. rate 17, height 5\' 6"  (1.676 m), weight 64.4 kg, SpO2 98%.   Medical Problem List and Plan: 1. Functional deficits secondary to right PLIC infarction likely secondary to small vessel disease, prior L basal ganglia infarct 2017 (no residual) Left hemiparesis, severe LUE sensory deficit, severe dysarthria             -patient may  shower             -ELOS/Goals: 14-16d   D/c PBD since just got here- at least 2 weeks  D/c 4/24- moved d/c per family going to funeral  -Continue CIR therapies including PT, OT, and SLP. Interdisciplinary team conference today to discuss goals, barriers to discharge, and dc planning.   -DVT/anticoagulation:  Pharmaceutical: Lovenox             -antiplatelet therapy: Aspirin 81 mg daily and Plavix 75 mg daily x 3 weeks then Plavix alone 3. Pain Management: Tylenol as needed  3/25- denies pain- con't regimen rpn   3-30: Ongoing left hip pain; Voltaren gel and Tylenol.  3/31- will start Vit D and Ca per pt request- has helped hip pain in past?  4/1- pt insistent was very helpful already for hip pain  4/2- pt wants at the same time- per chart, Ca and Vit D come at same time  4/3- having new burning pain in LUE- which is seen sometimes with stroke- will add Duloxetine 30  mg daily for nerve pain- monitor for side effects.  -if gets nausea, will change to Lyrica 4/4-6 no side effects so far-  4/8- will increase Duloxetine to 60 mg daily  4/14 tolerating duloxetine and meds without an issue 4. Mood/Behavior/Sleep: Provide emotional support  4/10- mood slightly better today  4/11- mood also looked slightly better this AM             -antipsychotic agents: N/A 5. Neuropsych/cognition: This patient is capable of making decisions on her own behalf. 6. Skin/Wound Care: Routine skin checks 7. Fluids/Electrolytes/Nutrition: Routine and outs with follow-up chemistries 8.  Dysphagia.  Dysphagia 3 with thins currently  4/15--eating 30-100% of meals 9.  Hyperlipidemia.  Crestor 10.  Recent History of cocaine/tobacco use.  UDS positive for cocaine.  Provide counseling, this has been a chronic issue , seen in PCP notes 11.  Hypothyroidism. continue Synthroid  12.  Constipation. D/c mirarlax, continue Senokot-S 1 tab twice daily   3/25- LBM last night after 10 days with no BM  4/7- LBM yesterday    3/25- Hb 16.1- and BUN up to 18, so is a little dry- BUN was 9 prior.   3/26- will recheck labs Thursday  3/27- Back to 14.9 after drinking more    15. Blurry vision  3/27- will need Ophtho after d/c.   3-29: Seems to mostly be from the left eye, with difficulty converging and tracking; messaged OT and PT to trial taping versus alternating eye patch to help with convergence.  3-30: Reporting improvement with use of taping  4/4- pt reports everything blurry- cannot see faces as a result- never had eye checked in past- used readers but cannot see at a distance since stroke- will send to Ophtho after d/c.   4/7- d/w pt again- she wants eyeglasses- explained we cannot get when in hospital- also per guidelines, since vision can still change, to not get for 3 months after stroke. She disbelieves it's from stroke, since had 2 prior- that didn't cause vision changes- we educated pt  pn how this can occur.   16.  Left hip pain.  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 C/O feeling dry  4/1- Labs look good- BUN 15 and Cr 0.69  4/11- will recheck labs Monday  4/14 labs stable/wnl 18. Cough  4/1- didn't hear the cough- sounds clear- if continues, will treat symptomatically.   4/2- pt feels like "coming down with something"- because her back is sore and was "freezing" last night- is 69 degrees in room  19. Severe HA/increased dysarthria  4/15 sx appear improved with topamax  20. Decreased safety awareness/impulsiveness  4/8- d/w pt as well as mother that pt would benefit from slowing down when she attempts function in therapy and with nursing  21. Depression  Continue Duloxetine  22. Spasticity  4/14 tone appears controlled. continue baclofen  23. 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 .   4/14 consult made??  24. Low grade temp. 99 at 1300 yesterday  4/15-occasional urinary incontinence. Will check ua   LOS: 22 days A FACE TO FACE EVALUATION WAS PERFORMED  Rawland Caddy 03/02/2024, 9:57 AM

## 2024-03-02 NOTE — Progress Notes (Signed)
 Speech Language Pathology Weekly Progress and Session Note  Patient Details  Name: Theresa Watkins MRN: [id number] Date of Birth: 1966-05-10  Beginning of progress report period: February 24, 2024 End of progress report period: March 02, 2024  Today's Date: 03/02/2024 SLP Individual Time: 4098-1191 SLP Individual Time Calculation (min): 56 min  Short Term Goals: Week 3: SLP Short Term Goal 1 (Week 3): Patient will consume least restrictive diet with use of compensatory strategies given supervision multimodal A SLP Short Term Goal 1 - Progress (Week 3): Not met SLP Short Term Goal 2 (Week 3): Patient will increase speech intelligibility to 90% at the sentence level given min multimodal A SLP Short Term Goal 2 - Progress (Week 3): Not met    New Short Term Goals: Week 4: SLP Short Term Goal 1 (Week 4): Patient will consume least restrictive diet with use of compensatory strategies given supervision multimodal A SLP Short Term Goal 2 (Week 4): Patient will increase speech intelligibility to 90% at the sentence level given min multimodal A  Weekly Progress Updates: Patient continued to make steady gains though did not meet any goals this reporting period. Currently, pt continues to require min A during meals for adequate use of safe swallowing strategies with current Dys 3 and thin liquid diet. She has improved to 80% intelligibility in known context at the sentence level. Pt/ family education ongoing. Pt would benefit from continued skilled SLP intervention to maximize swallowing and speech in order to maximize her functional independence prior to discharge.   Intensity: Minumum of 1-2 x/day, 30 to 90 minutes Frequency: 3 to 5 out of 7 days Duration/Length of Stay: 4/25 Treatment/Interventions: Multimodal communication approach;Speech/Language facilitation;Functional tasks;Therapeutic Activities;Therapeutic Exercise;Internal/external aids;Dysphagia/aspiration precaution  training;Patient/family education   Daily Session  Skilled Therapeutic Interventions: Patient seen in PM to address speech intelligibility and dysphagia management. Pt alert upon SLP arrival and agreeable for session. Pt requesting consumption of cookie from lunch meal. Pt attempting to eat in a reclined position. Pt receptive to education and instruction to improve positioning in order to achieve positioning close to 90 degrees. Pt consuming trials warranting intermittent cues to reduce talking during consumption. SLP continues to note pt manipulating bolus on tongue dorsum as opposed to lateralization. Pt responsive to cues for lateralizing bolus however reports some concern about ill fitting dentures. Pt with no s/s aspiration. In other minutes of session SLP guided pt in completion of EMST. Pt completed 5 sets of 5 with device set at 35cm H2O and given sup to min A for accurate completion. SLP addressing speech intelligibility through drill production of multisyllabic words ranging from 3 to 5 syllables. Pt warranting min cues for breath support. Pt subsequently challenged to produce multisyllabic words in a sentence with pt initially warranting min A for use of strategies with cues able to be faded to sup A and 90% intelligibility. At conclusion of session pt was left upright in bed with call button within reach and bed alarm active. SLP to continue POC.     General    Pain Pain Assessment Pain Scale: 0-10 Pain Score: 0-No pain  Therapy/Group: Individual Therapy  Adela Holter 03/02/2024, 3:58 PM

## 2024-03-02 NOTE — Progress Notes (Signed)
 Patient ID: Theresa Watkins, female   DOB: May 31, 1966, 58 y.o.   MRN: [id number]   [phone]- SW received call from pt mother reporting sh would like to speak with physician. SW will relay request.   1237- SW spoke with pt son Theresa Watkins and discussed in length current situation with his mother. He too would like a phone call from physician. SW relayed request.    Norval Been, MSW, LCSW Office: [phone] Cell: [phone] Fax: [phone]

## 2024-03-02 NOTE — Patient Care Conference (Signed)
 Inpatient RehabilitationTeam Conference and Plan of Care Update Date: 03/02/2024   Time: 1105 am    Patient Name: Theresa Watkins      Medical Record Number: [id number]  Date of Birth: [birth year] Sex: Female         Room/Bed: 4W01C/4W01C-01 Payor Info: Payor: [place] MEDICAID PREPAID HEALTH PLAN / Plan: [place] MEDICAID HEALTHY BLUE / Product Type: *No Product type* /    Admit Date/Time:  02/09/2024  6:29 PM  Primary Diagnosis:  Right middle cerebral artery stroke [facility])  Hospital Problems: Principal Problem:   Right middle cerebral artery stroke [facility]) Active Problems:   Polysubstance abuse [facility])    Expected Discharge Date: Expected Discharge Date: 03/12/24  Team Members Present: Physician leading conference: Dr. Abelino Able Social Worker Present: Norval Been, LCSWA Nurse Present: Jerene Monks, RN PT Present: Aundria Leech, PT OT Present: [name], OT;Jackqueline Mason, COTA SLP Present: Jenney Modest, SLP PPS Coordinator present : Jestine Moron, SLP     Current Status/Progress Goal Weekly Team Focus  Bowel/Bladder   Pt is incontinent of bowel and bladder   Regain continence of bowel and bladder   Assess pt for incontinence every 2 hours and offer toileting Q4 and prn    Swallow/Nutrition/ Hydration   dys 3 and thin   min A  carryover of compensatory strategies, IOPI and EMST    ADL's   bathing-mod A; LB dressing-max A; UB dressing-min A; squat pivot transfers min A with max verbal cues for safety;   Min A-SBA   BADLS, LUE NMR, safety awareness, education    Mobility   Supervision bed mobility, min a to CGA transfer d/t impulsivity, up to 40 ft gait at hall rail. limited participation d/t migraines   Supervision to min a overall  gait, transfers, participation    Communication   mixed dysarthria ranging from 80-90% intelligibility   min A        Safety/Cognition/ Behavioral Observations               Pain   pt only complaining of mild headache,  relieved by tylenol   Pts pain will remain controlled under pain level 4   Assess pt for pain every shift and PRN    Skin   Pt skin is intact, no skin issues   Skin will remain intact  Routine skin assessments and personal care Q2/PRN with repositioning/incontinence checks      Discharge Planning:  D/c pending family decision. Home vs SNF. Limited SNF options due to insurnace.  SW provided sitter list. SW will confirm there are no barriers to discharge.   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 Patient limited by  headaches, depression, left hemiparesis, left upper extremity sensory deficit , blurry vision, dysarthria, apraxia and  poor safety awareness.   Patient on target to meet rehab goals: yes, Patient requires  minimal assistance with UB dressing and max assistance with LB dressing. Patient requires max A squat pivot transfers with max verbal cues for safety. Overall goals for discharge are set for supervision -minimal assistance.   *See Care Plan and progress notes for long and short-term goals.   Revisions to Treatment Plan:   IOPI EMST  Teaching Needs: Safety, medications, transfers, toileting, etc    Current Barriers to Discharge: Decreased caregiver support and Incontinence  Possible Resolutions to Barriers: Family education     Medical Summary Current Status: left sided weakness UE>LE, pain issues/headache.  anxiety/depression.  Barriers to Discharge: Medical stability;Uncontrolled Pain   Possible Resolutions to [employer] Focus: pain mgt, anxiety regimen adjustment, daily assessment of labs and pt data   Continued Need for Acute Rehabilitation Level of Care: The patient requires daily medical management by a physician with specialized training in physical medicine and rehabilitation for the following reasons: Direction of a multidisciplinary physical rehabilitation program to maximize functional independence : Yes Medical management of patient stability for increased activity during participation in an intensive rehabilitation regime.: Yes Analysis of laboratory values and/or radiology reports with any subsequent need for medication adjustment and/or medical intervention. : Yes   I attest that I was present, lead the team conference, and concur with the assessment and plan of the team.   Jerene Monks 03/02/2024, 1105 am

## 2024-03-03 DIAGNOSIS — M792 Neuralgia and neuritis, unspecified: Secondary | ICD-10-CM | POA: Diagnosis not present

## 2024-03-03 DIAGNOSIS — R509 Fever, unspecified: Secondary | ICD-10-CM | POA: Diagnosis not present

## 2024-03-03 DIAGNOSIS — I63511 Cerebral infarction due to unspecified occlusion or stenosis of right middle cerebral artery: Secondary | ICD-10-CM | POA: Diagnosis not present

## 2024-03-03 DIAGNOSIS — I69391 Dysphagia following cerebral infarction: Secondary | ICD-10-CM | POA: Diagnosis not present

## 2024-03-03 DIAGNOSIS — F329 Major depressive disorder, single episode, unspecified: Secondary | ICD-10-CM | POA: Diagnosis not present

## 2024-03-03 LAB — URINALYSIS, W/ REFLEX TO CULTURE (INFECTION SUSPECTED)
Bilirubin Urine: NEGATIVE
Glucose, UA: NEGATIVE mg/dL
Hgb urine dipstick: NEGATIVE
Ketones, ur: 5 mg/dL — AB
Leukocytes,Ua: NEGATIVE
Nitrite: NEGATIVE
Protein, ur: NEGATIVE mg/dL
Specific Gravity, Urine: 1.024 (ref 1.005–1.030)
pH: 5 (ref 5.0–8.0)

## 2024-03-03 NOTE — Progress Notes (Signed)
 Physical Therapy Session Note  Patient Details  Name: Theresa Watkins MRN: [id number] Date of Birth: 1966/08/14  Today's Date: 03/03/2024 PT Individual Time: 4401-0272 PT Individual Time Calculation (min): 54 min   Short Term Goals: Week 3:  PT Short Term Goal 1 (Week 3): pt will initiate stair training PT Short Term Goal 2 (Week 3): pt will ambulate 100 ft with assistance PT Short Term Goal 3 (Week 3): Pt will be able to recall steps and direct transfer safely  Skilled Therapeutic Interventions/Progress Updates: Pt presents semi-reclined in bed, finishing phone call and agreeable to therapy.  Pt transfers from elevated HOB to sitting w/ min A, reaching for assist before cueing to initiate mobility.  Pt sat EOB for threading of pants over feet and donning shoes.  Pt required min A for doffing and donning clean pullover shirt.  Pt transferred sit to stand w/ min A and then min A for steadying for pt to assist w/ pants management w/.  Pt then step-pivoted to w/c w/ R HHA and cues.  Pt wheeled to main gym for time conservation.  Pt amb w/ R HHA x 30' x 2 w/ mod A and cues for weight acceptance to LLE before advancing RLE.  Pt states need for BR.  Pt wheeled to room and into BR.  Pt transfers sit to stand w/ min A and step to toilet, A+2 for clothing management.  Pt continent of bladder in toilet, charted in flowsheets.  Pt required A+2 for clothing management and then amb to bed w/ mod A and cueing for weight shift and then weight acceptance to LLE.  Pt transfers sit to supine w/ CGA.  Handed off care to NT.     Therapy Documentation Precautions:  Precautions Precautions: Fall Precaution/Restrictions Comments: L hemi Restrictions Weight Bearing Restrictions Per Provider Order: No General:   Vital Signs:   Pain:no c/o Pain Assessment Pain Scale: 0-10 Pain Score: 0-No pain   Therapy/Group: Individual Therapy  Candie Gintz P Branch Pacitti 03/03/2024, 2:45 PM

## 2024-03-03 NOTE — Progress Notes (Signed)
 Speech Language Pathology Daily Session Note  Patient Details  Name: Theresa Watkins MRN: [id number] Date of Birth: Sep 26, 1966  Today's Date: 03/03/2024 SLP Individual Time: 2725-3664 SLP Individual Time Calculation (min): 28 min  Short Term Goals: Week 4: SLP Short Term Goal 1 (Week 4): Patient will consume least restrictive diet with use of compensatory strategies given supervision multimodal A SLP Short Term Goal 2 (Week 4): Patient will increase speech intelligibility to 90% at the sentence level given min multimodal A  Skilled Therapeutic Interventions: Skilled therapy session focused on communication goals. SLP facilitated session by reviewing speech intelligibility strategies. Patient independently recalled "pause, slow, articulate" independently and "loud" with modA. SLP encouraged use during conversation to reach 90% intelligibility. SLP prompted patient to complete x25 repetitions of EMST at 30cm H20 to increased respiratory strength for speech. Patient completed repetitions with supervision A and verbal encouragement. Patient left in bed with alarm set and call bell in reach. Continue POC.  Pain None reported to SLP  Therapy/Group: Individual Therapy  [name] M.A., CCC-SLP 03/03/2024, 7:36 AM

## 2024-03-03 NOTE — Progress Notes (Signed)
 Patient ID: Theresa Watkins, female   DOB: [birth year], 58 y.o.   MRN: [id number]  [id number] returned phone call to pt son Theresa Watkins to answer questions in relation to follow-up about SNF placement process. Family would like ot see about long term care bed options. SW explained SNF placement process and challenges with getting a bed offer due to insurance. SW explained pt will likely not be local. SW will leave SNF list for insurance and medicare.gov at front desk for them to pick up and review.   Norval Been, MSW, LCSW Office: [phone] Cell: [phone] Fax: [phone]

## 2024-03-03 NOTE — Progress Notes (Signed)
 Occupational Therapy Session Note  Patient Details  Name: Theresa Watkins MRN: [id number] Date of Birth: Jul 03, 1966  Today's Date: 03/03/2024 OT Individual Time: 0930-1040 OT Individual Time Calculation (min): 70 min    Short Term Goals: Week 4:  OT Short Term Goal 1 (Week 4): Pt will complete LB dressing tasks with mod A OT Short Term Goal 2 (Week 4): Pt will complete UB dressing tasks with mod A OT Short Term Goal 3 (Week 4): Pt will complete toileting tasks with mod A  Skilled Therapeutic Interventions/Progress Updates:    Pt sleeping in bed upon arrival but easily aroused. Pt with increased lethargy this morning and slow to respond to questions. Pt reports the "medication" she had yesterday afternoon "knocks her out" and it takes time to "get out of her system." Pt declined dressing or getting OOB this morning but agreeable to LUE functional tasks and PROM/AAROM/AROM to increase LUE function in ADLs.   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  Pt with active movement at shoulder, elbow, and wrist/fingers but not functional. Shoulder AROM ~45*. Elbow flexion/extension WNL. Weak grasp and extension.  Pt able to perform LUE SROM with mod verbal cues for technique/safety.  Focus on LUE PROM/SROM/AAROM/AROM.  Pt remained in bed with all needs wihtin reach. Bed alarm activated.   Therapy Documentation Precautions:  Precautions Precautions: Fall Precaution/Restrictions Comments: L hemi Restrictions Weight Bearing Restrictions Per Provider Order: No   Pain: Pt denies pain this morning  Therapy/Group: Individual Therapy  [name] 03/03/2024, 12:02 PM

## 2024-03-03 NOTE — Progress Notes (Signed)
 Speech Language Pathology Daily Session Note  Patient Details  Name: Theresa Watkins MRN: [id number] Date of Birth: July 29, 1966  Today's Date: 03/03/2024 SLP Individual Time: 0803-0901 SLP Individual Time Calculation (min): 58 min  Short Term Goals: Week 4: SLP Short Term Goal 1 (Week 4): Patient will consume least restrictive diet with use of compensatory strategies given supervision multimodal A SLP Short Term Goal 2 (Week 4): Patient will increase speech intelligibility to 90% at the sentence level given min multimodal A  Skilled Therapeutic Interventions:  Patient was seen in am to address dysphagia management and speech intelligibility. Pt was alert and seated upright in bed communicating with MD during rounds. Noted  Dys 3 solids and thin liquids set up in front of her. She administered all trials. Pt requiring intermittent cues for bolus size modifications as she was observed presenting large boluses at a time. Pt with improved lateralization of boluses and good oral clearance across trials. RN in and out during session administering medication. Pt requesting to take meds whole with liquid. Pt taking a few small pills at a time and large pills one at a time. Pt requiring extensive time and multiple liquid washes per bolus. SLP recommending continue meds in applesauce due to greater efficiency. In other minutes of session, SLP addressing language. Pt reports decline in speech quality across last 2 days which she contributes to taking muscle relaxers. Pt noted with effortful speech, greater instances of perceived strain, and instances of abandoning communication due to effort. Pt guided in completion of EMST set at 37cm H2O. She completed 5 sets up 5 with sup to min A. In other minutes of session, SLP addressed speech through review of compensatory strategies including vocal rest as needed and short simple phrases when communication feels more effortful. Pt communicating about family members at  the sentence level throughout remainder of session with ~75-80% intelligibility. Pt left upright in bed with call button within reach and bed alarm active. SLP to continue POC.   Pain Pain Assessment Pain Scale: 0-10 Pain Score: 0-No pain  Therapy/Group: Individual Therapy  Adela Holter 03/03/2024, 11:13 AM

## 2024-03-03 NOTE — Progress Notes (Signed)
 PROGRESS NOTE   Subjective/Complaints: Did a little better last night. In good spirits this morning. Working on her breakfast. Feels that her speech is not as clear as it was last week. I asked her about headaches and she says they are better but has had migraines from time to time. Might be an element of manipulation with these. Tylenol seems to help.   ROS: Patient denies fever, rash, sore throat, blurred vision, dizziness, nausea, vomiting, diarrhea, cough, shortness of breath or chest pain, joint or back/neck pain, headache, or mood change.    Objective:   No results found.   Recent Labs    03/01/24 0536  WBC 5.0  HGB 15.0  HCT 44.8  PLT 169     Recent Labs    03/01/24 0536  NA 139  K 3.9  CL 107  CO2 23  GLUCOSE 83  BUN 15  CREATININE 0.75  CALCIUM 8.8*      Intake/Output Summary (Last 24 hours) at 03/03/2024 0855 Last data filed at 03/02/2024 1700 Gross per 24 hour  Intake 355 ml  Output --  Net 355 ml         Physical Exam: Vital Signs Blood pressure 120/66, pulse 61, temperature 97.8 F (36.6 C), resp. rate 18, height 5\' 6"  (1.676 m), weight 64.4 kg, SpO2 94%.  Constitutional: No distress . Vital signs reviewed. HEENT: NCAT, EOMI, oral membranes moist Neck: supple Cardiovascular: RRR without murmur. No JVD    Respiratory/Chest: CTA Bilaterally without wheezes or rales. Normal effort    GI/Abdomen: BS +, non-tender, non-distended Ext: no clubbing, cyanosis, or edema Psych: pleasant and cooperative  Neurological: alert. Dysarthric, but no different than what I've seen all week.   MAS of 1  in L shoulder and 1 in L elbow- none in L wrist or fingers so far- Hoffman's (+) in LUE- no change today since meds have worn off overnight  Mild to mod dysarthria.  Left central 7  Strength exam- LUE 2- to 2/5  LLE 3 to 3+/5.  -sensory exam normal. Prior neuro assessment is c/w today's exam 03/03/2024.   Skin- L PIP of 2nd digit- has very large scab vs plaque    Musculoskeletal:  + Pain and limited ROM in L hip internal >> extenal rotation; no deformity or crepitus.  FABER, FADIR with groin pain. Stable 4/15     Assessment/Plan: 1. Functional deficits which require 3+ hours per day of interdisciplinary therapy in a comprehensive inpatient rehab setting. Physiatrist is providing close team supervision and 24 hour management of active medical problems listed below. Physiatrist and rehab team continue to assess barriers to discharge/monitor patient progress toward functional and medical goals  Care Tool:  Bathing    Body parts bathed by patient: Face, Left arm, Chest, Abdomen, Front perineal area, Right upper leg, Left upper leg, Right lower leg, Left lower leg   Body parts bathed by helper: Buttocks, Right arm     Bathing assist Assist Level: Minimal Assistance - Patient > 75%     Upper Body Dressing/Undressing Upper body dressing   What is the patient wearing?: Pull over shirt    Upper body assist Assist Level:  Moderate Assistance - Patient 50 - 74%    Lower Body Dressing/Undressing Lower body dressing      What is the patient wearing?: Pants, Underwear/pull up     Lower body assist Assist for lower body dressing: Moderate Assistance - Patient 50 - 74%     Toileting Toileting    Toileting assist Assist for toileting: Maximal Assistance - Patient 25 - 49%     Transfers Chair/bed transfer  Transfers assist     Chair/bed transfer assist level: Contact Guard/Touching assist     Locomotion Ambulation   Ambulation assist      Assist level: 2 helpers Assistive device: Other (comment) (R hand rail) Max distance: 60   Walk 10 feet activity   Assist     Assist level: 2 helpers Assistive device: Other (comment) (R hand rail)   Walk 50 feet activity   Assist Walk 50 feet with 2 turns activity did not occur: Safety/medical concerns (L hemiplegia and  pt fatigue)  Assist level: 2 helpers      Walk 150 feet activity   Assist Walk 150 feet activity did not occur: Safety/medical concerns (L hemiplegia and pt fatigue)         Walk 10 feet on uneven surface  activity   Assist Walk 10 feet on uneven surfaces activity did not occur: Safety/medical concerns (L hemiplegia and pt fatigue)         Wheelchair     Assist Is the patient using a wheelchair?: Yes Type of Wheelchair: Manual    Wheelchair assist level: Supervision/Verbal cueing Max wheelchair distance: 150    Wheelchair 50 feet with 2 turns activity    Assist        Assist Level: Supervision/Verbal cueing   Wheelchair 150 feet activity     Assist      Assist Level: Supervision/Verbal cueing   Blood pressure 120/66, pulse 61, temperature 97.8 F (36.6 C), resp. rate 18, height 5\' 6"  (1.676 m), weight 64.4 kg, SpO2 94%.   Medical Problem List and Plan: 1. Functional deficits secondary to right PLIC infarction likely secondary to small vessel disease, prior L basal ganglia infarct 2017 (no residual) Left hemiparesis, severe LUE sensory deficit, severe dysarthria             -patient may  shower             -ELOS/Goals: 14-16d  -family pursuing placement. Spoke with mother and son at length yesterday. They cannot provide needed care.  2. DVT/anticoagulation:  Pharmaceutical: Lovenox             -antiplatelet therapy: Aspirin 81 mg daily and Plavix 75 mg daily x 3 weeks then Plavix alone 3. Pain Management: Tylenol as needed  3/25- denies pain- con't regimen rpn   3-30: Ongoing left hip pain; Voltaren gel and Tylenol.  3/31- will start Vit D and Ca per pt request- has helped hip pain in past?  4/1- pt insistent was very helpful already for hip pain  4/2- pt wants at the same time- per chart, Ca and Vit D come at same time  4/3- having new burning pain in LUE- which is seen sometimes with stroke- will add Duloxetine 30 mg daily for nerve pain-  monitor for side effects.  -if gets nausea, will change to Lyrica 4/4-6 no side effects so far-  4/8- will increase Duloxetine to 60 mg daily  4/14 tolerating duloxetine and meds without an issue 4. Mood/Behavior/Sleep: Provide emotional support  4/10-  mood slightly better today  4/11- mood also looked slightly better this AM             -antipsychotic agents: N/A 5. Neuropsych/cognition: This patient is capable of making decisions on her own behalf. 6. Skin/Wound Care: Routine skin checks 7. Fluids/Electrolytes/Nutrition: Routine and outs with follow-up chemistries 8.  Dysphagia.  Dysphagia 3 with thins currently  4/16--eating 30-100% of meals--takes extra time to eat 9.  Hyperlipidemia.  Crestor 10.  Recent History of cocaine/tobacco use.  UDS positive for cocaine.  Provide counseling, this has been a chronic issue , seen in PCP notes 11.  Hypothyroidism. continue Synthroid  12.  Constipation. D/c mirarlax, continue Senokot-S 1 tab twice daily   3/25- LBM last night after 10 days with no BM  4/7- LBM yesterday    3/25- Hb 16.1- and BUN up to 18, so is a little dry- BUN was 9 prior.   3/26- will recheck labs Thursday  LBM 4/15    15. Blurry vision  3/27- will need Ophtho after d/c.   3-29: Seems to mostly be from the left eye, with difficulty converging and tracking; messaged OT and PT to trial taping versus alternating eye patch to help with convergence.  3-30: Reporting improvement with use of taping  4/4- pt reports everything blurry- cannot see faces as a result- never had eye checked in past- used readers but cannot see at a distance since stroke- will send to Ophtho after d/c.   4/7- d/w pt again- she wants eyeglasses- explained we cannot get when in hospital- also per guidelines, since vision can still change, to not get for 3 months after stroke. She disbelieves it's from stroke, since had 2 prior- that didn't cause vision changes- we educated pt pn how this can occur.   16.   Left hip pain.  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 C/O feeling dry  4/1- Labs look good- BUN 15 and Cr 0.69  4/11- will recheck labs Monday  4/14 labs stable/wnl 18. Cough  4/1- didn't hear the cough- sounds clear- if continues, will treat symptomatically.   4/2- pt feels like "coming down with something"- because her back is sore and was "freezing" last night- is 69 degrees in room  19. Severe HA/increased dysarthria  4/15-16 sx appear improved with topamax, tylenol helps also   -may use sx to get out of therapy at times?  20. Decreased safety awareness/impulsiveness  4/8- d/w pt as well as mother that pt would benefit from slowing down when she attempts function in therapy and with nursing  21. Depression  Continue Duloxetine  22. Spasticity  4/14 tone appears controlled. continue baclofen  23. 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 .   4/14 consult made??  24. Low grade temp. Afebrile over last 24 hours  4/16 UA negative to equivocal--no ucx   LOS: 23 days A FACE TO FACE EVALUATION WAS PERFORMED  Ranelle Oyster 03/03/2024, 8:55 AM

## 2024-03-03 NOTE — Plan of Care (Signed)
  Problem: Consults Goal: RH STROKE PATIENT EDUCATION Description: See Patient Education module for education specifics  Outcome: Progressing   Problem: RH SAFETY Goal: RH STG ADHERE TO SAFETY PRECAUTIONS W/ASSISTANCE/DEVICE Description: STG Adhere to Safety Precautions With Assistance/Device. Outcome: Progressing

## 2024-03-03 NOTE — Progress Notes (Signed)
 Occupational Therapy Weekly Progress Note  Patient Details  Name: Theresa Watkins MRN: [id number] Date of Birth: [birth year]  Beginning of progress report period: February 24, 2024 End of progress report period: March 03, 2024  Patient has met 0 of 3 short term goals.  Pt progress has been inconsistent during the past week and she has missed 2 therapy sessions 2/2 headaches and pt refusal. Pt continues to be impulsive, especially during transitional movements, and requires max verbal cues for safety awareness and techniques. Stand pivot transfers with min A. Bathing at shower level with mod A seated on TTB. LB dressing with max A. UB dressing with max A and max verbal cues for technique. Toileting with max A. Pt continues to exhibit unsafe behaviors and is frequently not receptive to learning new techniques/strategies. Family has been present for education. Discharge plans uncertain at this time.   Patient continues to demonstrate the following deficits: muscle weakness, decreased cardiorespiratoy endurance, impaired timing and sequencing, unbalanced muscle activation, motor apraxia, decreased coordination, and decreased motor planning, decreased midline orientation and decreased motor planning, decreased attention, decreased awareness, decreased problem solving, decreased safety awareness, decreased memory, and delayed processing, and decreased standing balance, decreased postural control, hemiplegia, and decreased balance strategies and therefore will continue to benefit from skilled OT intervention to enhance overall performance with BADL and Reduce care partner burden.  Patient progressing toward long term goals..  Continue plan of care.  OT Short Term Goals Week 3:  OT Short Term Goal 1 (Week 3): Pt will complete LB dressing tasks with mod A OT Short Term Goal 1 - Progress (Week 3): Progressing toward goal OT Short Term Goal 2 (Week 3): Pt will complete LB dressing tasks with mod A OT Short Term  Goal 3 (Week 3): Pt will utilize LUE as gross assist during funcitonal tasks with mod A OT Short Term Goal 3 - Progress (Week 3): Progressing toward goal Week 4:  OT Short Term Goal 1 (Week 4): Pt will complete LB dressing tasks with mod A OT Short Term Goal 2 (Week 4): Pt will complete UB dressing tasks with mod A OT Short Term Goal 3 (Week 4): Pt will complete toileting tasks with mod A   Theresa Watkins 03/03/2024, 6:56 AM

## 2024-03-04 DIAGNOSIS — R509 Fever, unspecified: Secondary | ICD-10-CM | POA: Diagnosis not present

## 2024-03-04 DIAGNOSIS — F329 Major depressive disorder, single episode, unspecified: Secondary | ICD-10-CM | POA: Diagnosis not present

## 2024-03-04 DIAGNOSIS — I69391 Dysphagia following cerebral infarction: Secondary | ICD-10-CM | POA: Diagnosis not present

## 2024-03-04 DIAGNOSIS — M792 Neuralgia and neuritis, unspecified: Secondary | ICD-10-CM | POA: Diagnosis not present

## 2024-03-04 DIAGNOSIS — I63511 Cerebral infarction due to unspecified occlusion or stenosis of right middle cerebral artery: Secondary | ICD-10-CM | POA: Diagnosis not present

## 2024-03-04 MED ORDER — CARBAMIDE PEROXIDE 6.5 % OT SOLN
5.0000 [drp] | Freq: Two times a day (BID) | OTIC | Status: AC
  Filled 2024-03-04: qty 15

## 2024-03-04 NOTE — Progress Notes (Signed)
 Occupational Therapy Session Note  Patient Details  Name: Theresa Watkins MRN: [id number] Date of Birth: [birth year]  Today's Date: 03/05/2024 OT Individual Time: 1350-1415 OT Individual Time Calculation (min): 25 min   Short Term Goals: Week 3:  OT Short Term Goal 1 (Week 3): Pt will complete LB dressing tasks with mod A OT Short Term Goal 1 - Progress (Week 3): Progressing toward goal OT Short Term Goal 2 (Week 3): Pt will complete LB dressing tasks with mod A OT Short Term Goal 3 (Week 3): Pt will utilize LUE as gross assist during funcitonal tasks with mod A OT Short Term Goal 3 - Progress (Week 3): Progressing toward goal  Skilled Therapeutic Interventions/Progress Updates:  Pt received resting in bed, no complaints of pain. Pt comes to EOB with Min A + HOB elevated. Squat-pivot from EOB<>WC x3 with Min A overall, min cuing for technique. Pt dependent for WC transport from room<>day room for time management. In day room, pt instructed in series of table glides to address AROM/PROM. Pt requires manual facilitation to reach tolerated end-range. Pt remained resting in bed with all immediate needs met.   Therapy Documentation Precautions:  Precautions Precautions: Fall Precaution/Restrictions Comments: L hemi Restrictions Weight Bearing Restrictions Per Provider Order: No   Therapy/Group: Individual Therapy  Artemus Biles, OTR/L, MSOT  03/05/2024, 4:09 PM

## 2024-03-04 NOTE — Progress Notes (Signed)
 Physical Therapy Session Note  Patient Details  Name: Theresa Watkins MRN: [id number] Date of Birth: 1966-10-08  Today's Date: 03/04/2024 PT Individual Time: 1120-1133 PT Individual Time Calculation (min): 13 min  and Today's Date: 03/04/2024 PT Missed Time: 32 Minutes Missed Time Reason: Patient unwilling to participate  Short Term Goals: Week 2:  PT Short Term Goal 1 (Week 2): pt will ambulate 100 ft with assistance PT Short Term Goal 1 - Progress (Week 2): Progressing toward goal PT Short Term Goal 2 (Week 2): Pt will perform bed/chair transfer with CGA consistently PT Short Term Goal 2 - Progress (Week 2): Met PT Short Term Goal 3 (Week 2): Pt will initiate stair training PT Short Term Goal 3 - Progress (Week 2): Not met  Skilled Therapeutic Interventions/Progress Updates:    Pt recd in bed and declining therapy. Pt states she doesn't feel well after nosebleed and her "ear feels weird" so she cannot participate. Offered bed level therapy, toileting, etc, which pt declined. Pt did ask for assist with changing soiled brief. Pt able to begin ripping brief off and hygiene with set up. Pt then assisted with donning brief using bridge technique. Pt then assisted with pulling up in bed using bed features, min a. Pt declined any further therapy, so missed x 32 min of scheduled therapy, will attempt to make up as able. Pt was left with all needs in reach and alarm active.   Therapy Documentation Precautions:  Precautions Precautions: Fall Precaution/Restrictions Comments: L hemi Restrictions Weight Bearing Restrictions Per Provider Order: No General: PT Amount of Missed Time (min): 32 Minutes PT Missed Treatment Reason: Patient unwilling to participate     Therapy/Group: Individual Therapy  Tex Filbert 03/04/2024, 11:47 AM

## 2024-03-04 NOTE — Progress Notes (Signed)
 Occupational Therapy Session Note  Patient Details  Name: Theresa Watkins MRN: [id number] Date of Birth: 1966-06-19  Today's Date: 03/04/2024 OT Individual Time: 4098-1191 OT Individual Time Calculation (min): 30 min  and Today's Date: 03/04/2024 OT Missed Time: 45 Minutes Missed Time Reason: Other (comment) (pt reports she doesn't "feel good")   Short Term Goals: Week 4:  OT Short Term Goal 1 (Week 4): Pt will complete LB dressing tasks with mod A OT Short Term Goal 2 (Week 4): Pt will complete UB dressing tasks with mod A OT Short Term Goal 3 (Week 4): Pt will complete toileting tasks with mod A  Skilled Therapeutic Interventions/Progress Updates:    Pt resting in bed upon arrival. Pt declined donning clothing or getting OOB but agreeable to LUE PROM/AAROM/AROM. Pt declined repositioning in bed. Pt reports she does not feel good and states she feels hot. Last temp documented WNL. Pt with increased difficulty forming words this morning and requires more then usual. LUE PROM/AAROM/AROM to increase functional to assist with BADLs. Pt remained in bed. Pt missed 45 mins skilled OT services 2/2 "not feeling good' and "not feeling like it." Will attempt to see later as schedule allows. Therapy scheduled to 15/7 after consultation with tx team.   Therapy Documentation Precautions:  Precautions Precautions: Fall Precaution/Restrictions Comments: L hemi Restrictions Weight Bearing Restrictions Per Provider Order: No General: General OT Amount of Missed Time: 45 Minutes Pain: Pt denies pain but states she doesn't "feel good" and alternates between being hot and cold; nursing staff notified  Therapy/Group: Individual Therapy  [name] 03/04/2024, 9:25 AM

## 2024-03-04 NOTE — Progress Notes (Signed)
 Physical Therapy Note  Patient Details  Name: ERINE PHENIX MRN: [id number] Date of Birth: Apr 01, 1966 Today's Date: 03/04/2024    Pt's plan of care adjusted to 15/7 after speaking with care team and discussed with PA as pt currently unable to tolerate current therapy schedule with OT, PT, and SLP.     Tex Filbert 03/04/2024, 11:59 AM

## 2024-03-04 NOTE — Progress Notes (Signed)
 Speech Language Pathology Daily Session Note  Patient Details  Name: Theresa Watkins MRN: [id number] Date of Birth: 1965/12/07  Today's Date: 03/04/2024 SLP Individual Time: 1016-1100 SLP Individual Time Calculation (min): 44 min  Short Term Goals: Week 4: SLP Short Term Goal 1 (Week 4): Patient will consume least restrictive diet with use of compensatory strategies given supervision multimodal A SLP Short Term Goal 2 (Week 4): Patient will increase speech intelligibility to 90% at the sentence level given min multimodal A  Skilled Therapeutic Interventions: SLP conducted skilled therapy session targeting communication goals. Upon SLP entry, patient asleep with blankets/pillows mounded on top of her, but with encouragement she was agreeable to therapy tasks. In earlier part of session, patient experienced a nosebleed. RN alerted. Additionally, patient endorses change in sensation in left ear, stating it feels 'full' and further reports brief periods of feeling hot. SLP will alert RN staff to changes. SLP facilitated sentence-level speech intelligibility task. When provided with a word, patient tasked with creating a sentence using the word and incorporating SLOP strategies. With min verbal cues from SLP, patient achieved 90% intelligibility, benefiting most from cues to increase volume at the end of sentences. Patient was left in lowered bed with call bell in reach and bed alarm set. SLP will continue to target goals per plan of care.       Pain Pain Assessment Pain Scale: 0-10 Pain Score: 0-No pain  Therapy/Group: Individual Therapy  [name], M.A., CCC-SLP  Selestino Nila A Henri Baumler 03/04/2024, 11:15 AM

## 2024-03-04 NOTE — Progress Notes (Signed)
 Speech Language Pathology Daily Session Note  Patient Details  Name: Theresa Watkins MRN: [id number] Date of Birth: [birth year]  Today's Date: 03/04/2024 SLP Individual Time: 1409-1440 SLP Individual Time Calculation (min): 31 min  Short Term Goals: Week 4: SLP Short Term Goal 1 (Week 4): Patient will consume least restrictive diet with use of compensatory strategies given supervision multimodal A SLP Short Term Goal 2 (Week 4): Patient will increase speech intelligibility to 90% at the sentence level given min multimodal A  Skilled Therapeutic Interventions: SLP conducted skilled therapy session targeting swallowing goals. SLP assessed ongoing diet tolerance of Dys3/thin liquid diet with meal tray. Patient consumed hamburger, collard greens, and thin liquids with no overt s/sx of penetration/aspiration. Patient did demonstrate prolonged mastication as in line with patient's prior diet tolerance assessments. Recommend continuation of Dys3/thin liquid diet. Patient was left in room with call bell in reach and alarm set. SLP will continue to target goals per plan of care.        Pain Pain Assessment Pain Scale: 0-10 Pain Score: 0-No pain  Therapy/Group: Individual Therapy  Dorethia Jeanmarie, M.A., CCC-SLP  Landy Dunnavant A Burney Calzadilla 03/04/2024, 3:57 PM

## 2024-03-04 NOTE — Progress Notes (Signed)
 PROGRESS NOTE   Subjective/Complaints: Again says she feels "feverish". Also wanted me to know that she feels the baclofen makes her feel "loopy".  She showed me some stretches she's doing for her LUE. Feels that swelling is better in hand. Reports an odor to urine.   ROS: Patient denies fever, rash, sore throat, blurred vision, dizziness, nausea, vomiting, diarrhea, cough, shortness of breath or chest pain, headache, or mood change.    Objective:   No results found.   No results for input(s): "WBC", "HGB", "HCT", "PLT" in the last 72 hours.    No results for input(s): "NA", "K", "CL", "CO2", "GLUCOSE", "BUN", "CREATININE", "CALCIUM" in the last 72 hours.     Intake/Output Summary (Last 24 hours) at 03/04/2024 1005 Last data filed at 03/04/2024 0804 Gross per 24 hour  Intake 354 ml  Output --  Net 354 ml         Physical Exam: Vital Signs Blood pressure 131/83, pulse (!) 58, temperature 98.7 F (37.1 C), temperature source Oral, resp. rate 16, height 5\' 6"  (1.676 m), weight 64.4 kg, SpO2 97%.  Constitutional: No distress . Vital signs reviewed. HEENT: NCAT, EOMI, oral membranes moist Neck: supple Cardiovascular: RRR without murmur. No JVD    Respiratory/Chest: CTA Bilaterally without wheezes or rales. Normal effort    GI/Abdomen: BS +, non-tender, non-distended Ext: no clubbing, cyanosis, tr LUE edema Psych: pleasant and cooperative  Neurological: fairly alert. Speech remains quite dysarthric.   MAS of 1  in L shoulder and 1 in L elbow- none in L wrist or fingers so far- Hoffman's (+) in LUE- no change today since meds have worn off overnight  Mild to mod dysarthria.  Left central 7  Strength exam- LUE 2- to 2/5  LLE 3 to 3+/5.  -sensory exam normal. Prior neuro assessment is c/w today's exam 03/04/2024.  Skin- L PIP of 2nd digit- has very large scab vs plaque    Musculoskeletal:  Left hip pain with IR/ER.  Moved LLE easily in bed (flex/ext) without discomfort.     Assessment/Plan: 1. Functional deficits which require 3+ hours per day of interdisciplinary therapy in a comprehensive inpatient rehab setting. Physiatrist is providing close team supervision and 24 hour management of active medical problems listed below. Physiatrist and rehab team continue to assess barriers to discharge/monitor patient progress toward functional and medical goals  Care Tool:  Bathing    Body parts bathed by patient: Face, Left arm, Chest, Abdomen, Front perineal area, Right upper leg, Left upper leg, Right lower leg, Left lower leg   Body parts bathed by helper: Buttocks, Right arm     Bathing assist Assist Level: Minimal Assistance - Patient > 75%     Upper Body Dressing/Undressing Upper body dressing   What is the patient wearing?: Pull over shirt    Upper body assist Assist Level: Moderate Assistance - Patient 50 - 74%    Lower Body Dressing/Undressing Lower body dressing      What is the patient wearing?: Pants, Underwear/pull up     Lower body assist Assist for lower body dressing: Moderate Assistance - Patient 50 - 74%     Toileting Toileting  Toileting assist Assist for toileting: Maximal Assistance - Patient 25 - 49%     Transfers Chair/bed transfer  Transfers assist     Chair/bed transfer assist level: Minimal Assistance - Patient > 75%     Locomotion Ambulation   Ambulation assist      Assist level: Moderate Assistance - Patient 50 - 74% Assistive device: Hand held assist Max distance: 30   Walk 10 feet activity   Assist     Assist level: Moderate Assistance - Patient - 50 - 74% Assistive device: Hand held assist   Walk 50 feet activity   Assist Walk 50 feet with 2 turns activity did not occur: Safety/medical concerns (L hemiplegia and pt fatigue)  Assist level: 2 helpers      Walk 150 feet activity   Assist Walk 150 feet activity did not  occur: Safety/medical concerns (L hemiplegia and pt fatigue)         Walk 10 feet on uneven surface  activity   Assist Walk 10 feet on uneven surfaces activity did not occur: Safety/medical concerns (L hemiplegia and pt fatigue)         Wheelchair     Assist Is the patient using a wheelchair?: Yes Type of Wheelchair: Manual    Wheelchair assist level: Supervision/Verbal cueing Max wheelchair distance: 150    Wheelchair 50 feet with 2 turns activity    Assist        Assist Level: Supervision/Verbal cueing   Wheelchair 150 feet activity     Assist      Assist Level: Supervision/Verbal cueing   Blood pressure 131/83, pulse (!) 58, temperature 98.7 F (37.1 C), temperature source Oral, resp. rate 16, height 5\' 6"  (1.676 m), weight 64.4 kg, SpO2 97%.   Medical Problem List and Plan: 1. 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 SW and I have spoken with mother and son at length this week. They cannot provide needed care.  2. DVT/anticoagulation:  Pharmaceutical: Lovenox             -antiplatelet therapy: Aspirin 81 mg daily and Plavix 75 mg daily x 3 weeks then Plavix alone 3. Pain Management: Tylenol as needed  3/25- denies pain- con't regimen rpn   3-30: Ongoing left hip pain; Voltaren gel and Tylenol.  3/31- will start Vit D and Ca per pt request- has helped hip pain in past?  4/1- pt insistent was very helpful already for hip pain  4/2- pt wants at the same time- per chart, Ca and Vit D come at same time  4/3- having new burning pain in LUE- which is seen sometimes with stroke- will add Duloxetine 30 mg daily for nerve pain- monitor for side effects.  -if gets nausea, will change to  Lyrica 4/4-6 no side effects so far-  4/8- will increase Duloxetine to 60 mg daily  4/17 pain appears improved. Less burning sensations left side.  -will hold baclofen d/t pt c/o of disorientation from med   -tone is minimal at this poin 4. Mood/Behavior/Sleep: Provide emotional support  4/10- mood slightly better today  4/11- mood also looked slightly better this AM             -  antipsychotic agents: N/A 5. Neuropsych/cognition: This patient is capable of making decisions on her own behalf. 6. Skin/Wound Care: Routine skin checks 7. Fluids/Electrolytes/Nutrition:encouraging po.   4/17 recent lab work wnl.  8.  Dysphagia.  Dysphagia 3 with thins currently  4/17--intake can still fluctuate from 25%-100% of meals--takes extra time to eat--continue to encourage PO.   9.  Hyperlipidemia.  Crestor 10.  Recent History of cocaine/tobacco use.  UDS positive for cocaine.  Provide counseling, this has been a chronic issue , seen in PCP notes 11.  Hypothyroidism. continue Synthroid  12.  Constipation.   Senokot-S 2 tab twice daily   LBM 4/15    15. Blurry vision  3/27- will need Ophtho after d/c.   3-29: Seems to mostly be from the left eye, with difficulty converging and tracking; messaged OT and PT to trial taping versus alternating eye patch to help with convergence.  3-30: Reporting improvement with use of taping  4/4- pt reports everything blurry- cannot see faces as a result- never had eye checked in past- used readers but cannot see at a distance since stroke- will send to Ophtho after d/c.   4/7- d/w pt again- she wants eyeglasses- explained we cannot get when in hospital- also per guidelines, since vision can still change, to not get for 3 months after stroke. She disbelieves it's from stroke, since had 2 prior- that didn't cause vision changes- we educated pt pn how this can occur.   16.  Left hip pain.  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 C/O feeling dry  4/1- Labs look good- BUN 15 and Cr 0.69  4/11- will recheck labs Monday  4/14 labs stable/wnl 18. Cough  -resolved  19. Severe HA/increased dysarthria  4/15-16 sx appear improved with topamax, tylenol helps also   -may use sx to get out of therapy at times?  20. Decreased safety awareness/impulsiveness  4/8- d/w pt as well as mother that pt would benefit from slowing down when she attempts function in therapy and with nursing  21. Depression  Continue Duloxetine  22. Spasticity  4/14 tone appears controlled. continue baclofen  23. 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 .   4/17 have discussed with our PA about making an outpt derm referral  24. Low grade temp. Afebrile over last 48 hours  4/17UA negative  --no ucx  -reports odor. I did not smell anything  -nursing will follow up to see if they notice any odor or discharge   LOS: 24 days A FACE TO FACE EVALUATION WAS PERFORMED  Rawland Caddy 03/04/2024, 10:05 AM

## 2024-03-04 NOTE — Progress Notes (Signed)
 Patient ID: Theresa Watkins, female   DOB: Jan 05, 1966, 58 y.o.   MRN: [id number]  SW called [facility] to discuss referral. Primary admissions coord-Crystal out sick. SW spoke with DONBurdette Carolin and she will relay message about referral and will follow-up.  *referral declined.   No answer at [facility] after multiple attempts. Will continue to make efforts.  *SW spoke with Loetta Ringer Norris/Admissions with [employer]. She will review and follow-up after speaking with DON.  SW left message for [name]/Admissions with [facility] and witing on follow-up.  Norval Been, MSW, LCSW Office: [phone] Cell: [phone] Fax: [phone]

## 2024-03-04 NOTE — Plan of Care (Signed)
  Problem: RH BOWEL ELIMINATION Goal: RH STG MANAGE BOWEL WITH ASSISTANCE Description: STG Manage Bowel with toileting Assistance. Outcome: Progressing   Problem: RH SAFETY Goal: RH STG ADHERE TO SAFETY PRECAUTIONS W/ASSISTANCE/DEVICE Description: STG Adhere to Safety Precautions With cues Assistance/Device. Outcome: Progressing   Problem: RH PAIN MANAGEMENT Goal: RH STG PAIN MANAGED AT OR BELOW PT'S PAIN GOAL Description: Pain managed < 4 with prns Outcome: Progressing   Problem: RH KNOWLEDGE DEFICIT Goal: RH STG INCREASE KNOWLEDGE OF HYPERTENSION Description: Patient and mother will be able to manage HTN using educational resources for medications and dietary modification independently Outcome: Progressing

## 2024-03-05 DIAGNOSIS — N3289 Other specified disorders of bladder: Secondary | ICD-10-CM | POA: Diagnosis not present

## 2024-03-05 DIAGNOSIS — I63511 Cerebral infarction due to unspecified occlusion or stenosis of right middle cerebral artery: Secondary | ICD-10-CM | POA: Diagnosis not present

## 2024-03-05 NOTE — Progress Notes (Signed)
 Occupational Therapy Session Note  Patient Details  Name: Theresa Watkins MRN: [id number] Date of Birth: 04-30-1966  {CHL IP REHAB OT TIME CALCULATIONS:[id number]}   Short Term Goals: Week 4:  OT Short Term Goal 1 (Week 4): Pt will complete LB dressing tasks with mod A OT Short Term Goal 2 (Week 4): Pt will complete UB dressing tasks with mod A OT Short Term Goal 3 (Week 4): Pt will complete toileting tasks with mod A  Skilled Therapeutic Interventions/Progress Updates:      Therapy Documentation Precautions:  Precautions Precautions: Fall Precaution/Restrictions Comments: L hemi Restrictions Weight Bearing Restrictions Per Provider Order: No   Therapy/Group: Individual Therapy  Artemus Biles, OTR/L, MSOT  03/05/2024, 6:31 PM

## 2024-03-05 NOTE — Progress Notes (Signed)
 PROGRESS NOTE   Subjective/Complaints:   States she was not changed last noc, no pain c/os, just finished shower with OT   ROS: Patient denies breathing issues, bowel issues, c/o being in wetdiaper all night    Objective:   No results found.   No results for input(s): "WBC", "HGB", "HCT", "PLT" in the last 72 hours.    No results for input(s): "NA", "K", "CL", "CO2", "GLUCOSE", "BUN", "CREATININE", "CALCIUM " in the last 72 hours.     Intake/Output Summary (Last 24 hours) at 03/05/2024 0948 Last data filed at 03/04/2024 1757 Gross per 24 hour  Intake 240 ml  Output --  Net 240 ml         Physical Exam: Vital Signs Blood pressure 117/73, pulse 69, temperature 98.8 F (37.1 C), temperature source Oral, resp. rate 17, height 5\' 6"  (1.676 m), weight 64.4 kg, SpO2 97%.   General: No acute distress Mood and affect are appropriate Heart: Regular rate and rhythm no rubs murmurs or extra sounds Lungs: Clear to auscultation, breathing unlabored, no rales or wheezes Abdomen: Positive bowel sounds, soft nontender to palpation, nondistended Extremities: No clubbing, cyanosis, or edema  Neurological: fairly alert. Speech remains quite dysarthric.   MAS of 1  in L shoulder and 1 in L elbow- none in L wrist or fingers so far- Hoffman's (+) in LUE- no change today since meds have worn off overnight  Mild to mod dysarthria.  Left central 7  Strength exam- LUE 2- to 2/5  LLE 3 to 3+/5.  -sensory exam normal. Prior neuro assessment is c/w today's exam 03/05/2024.  Skin- L PIP of 2nd digit- has very large scab vs plaque    Musculoskeletal:  Left hip pain with IR/ER. Moved LLE easily in bed (flex/ext) without discomfort.     Assessment/Plan: 1. Functional deficits which require 3+ hours per day of interdisciplinary therapy in a comprehensive inpatient rehab setting. Physiatrist is providing close team supervision and 24  hour management of active medical problems listed below. Physiatrist and rehab team continue to assess barriers to discharge/monitor patient progress toward functional and medical goals  Care Tool:  Bathing    Body parts bathed by patient: Face, Left arm, Chest, Abdomen, Front perineal area, Right upper leg, Left upper leg, Right lower leg, Left lower leg   Body parts bathed by helper: Buttocks, Right arm     Bathing assist Assist Level: Minimal Assistance - Patient > 75%     Upper Body Dressing/Undressing Upper body dressing   What is the patient wearing?: Pull over shirt    Upper body assist Assist Level: Moderate Assistance - Patient 50 - 74%    Lower Body Dressing/Undressing Lower body dressing      What is the patient wearing?: Pants, Underwear/pull up     Lower body assist Assist for lower body dressing: Moderate Assistance - Patient 50 - 74%     Toileting Toileting    Toileting assist Assist for toileting: Maximal Assistance - Patient 25 - 49%     Transfers Chair/bed transfer  Transfers assist     Chair/bed transfer assist level: Minimal Assistance - Patient > 75%  Locomotion Ambulation   Ambulation assist      Assist level: Moderate Assistance - Patient 50 - 74% Assistive device: Hand held assist Max distance: 30   Walk 10 feet activity   Assist     Assist level: Moderate Assistance - Patient - 50 - 74% Assistive device: Hand held assist   Walk 50 feet activity   Assist Walk 50 feet with 2 turns activity did not occur: Safety/medical concerns (L hemiplegia and pt fatigue)  Assist level: 2 helpers      Walk 150 feet activity   Assist Walk 150 feet activity did not occur: Safety/medical concerns (L hemiplegia and pt fatigue)         Walk 10 feet on uneven surface  activity   Assist Walk 10 feet on uneven surfaces activity did not occur: Safety/medical concerns (L hemiplegia and pt fatigue)          Wheelchair     Assist Is the patient using a wheelchair?: Yes Type of Wheelchair: Manual    Wheelchair assist level: Supervision/Verbal cueing Max wheelchair distance: 150    Wheelchair 50 feet with 2 turns activity    Assist        Assist Level: Supervision/Verbal cueing   Wheelchair 150 feet activity     Assist      Assist Level: Supervision/Verbal cueing   Blood pressure 117/73, pulse 69, temperature 98.8 F (37.1 C), temperature source Oral, resp. rate 17, height 5\' 6"  (1.676 m), weight 64.4 kg, SpO2 97%.   Medical Problem List and Plan: 1. 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 SW and I have spoken with mother and son at length this week. They cannot provide needed care.  2. DVT/anticoagulation:  Pharmaceutical: Lovenox              -antiplatelet therapy: Aspirin  81 mg daily and Plavix  75 mg daily x 3 weeks then Plavix  alone 3. Pain Management: Tylenol  as needed  3/25- denies pain- con't regimen rpn   3-30: Ongoing left hip pain; Voltaren  gel and Tylenol .  3/31- will start Vit D and Ca per pt request- has helped hip pain in past?  4/1- pt insistent was very helpful already for hip pain  4/2- pt wants at the same time- per chart, Ca and Vit D come at same time  4/3- having new burning pain in LUE- which is seen sometimes with stroke- will add Duloxetine  30 mg daily for nerve pain- monitor for side effects.  -if gets nausea, will change to Lyrica 4/4-6 no side effects so far-  4/8- will increase Duloxetine  to 60 mg daily      4. Mood/Behavior/Sleep: Provide emotional support  4/10- mood slightly better today  4/11- mood also looked slightly better this AM             -antipsychotic  agents: N/A 5. Neuropsych/cognition: This patient is capable of making decisions on her own behalf. 6. Skin/Wound Care: Routine skin checks 7. Fluids/Electrolytes/Nutrition:encouraging po.   4/17 recent lab work wnl.  8.  Dysphagia.  Dysphagia 3 with thins currently  4/18 intake ok    9.  Hyperlipidemia.  Crestor  10.  Recent  History of cocaine/tobacco use.  UDS positive for cocaine.  Provide counseling, this has been a chronic issue , seen in PCP notes 11.  Hypothyroidism. continue Synthroid   12.  Constipation.   Senokot-S 2 tab twice daily   LBM 4/15    15. Blurry vision  3/27- will need Ophtho after d/c.   3-29: Seems to mostly be from the left eye, with difficulty converging and tracking; messaged OT and PT to trial taping versus alternating eye patch to help with convergence.  3-30: Reporting improvement with use of taping  4/4- pt reports everything blurry- cannot see faces as a result- never had eye checked in past- used readers but cannot see at a distance since stroke- will send to Ophtho after d/c.   4/7- d/w pt again- she wants eyeglasses- explained we cannot get when in hospital- also per guidelines, since vision can still change, to not get for 3 months after stroke. She disbelieves it's from stroke, since had 2 prior- that didn't cause vision changes- we educated pt pn how this can occur.   16.  Left hip pain.  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 Severe HA-resolved  4/15-16 sx appear improved with topamax , tylenol  helps also   -may use sx to get out of therapy at times?  18. Decreased safety awareness/impulsiveness    19. Depression  Continue Duloxetine  60mg /d  20. Spasticity  4/18 tone appears controlled. Off baclofen , cont PT, OT  21. 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 .    22.  Urinary incont due to CVA, cont toileting program    LOS: 25 days A FACE TO FACE EVALUATION WAS PERFORMED  Theresa Watkins 03/05/2024, 9:48 AM

## 2024-03-05 NOTE — Progress Notes (Signed)
 Speech Language Pathology Daily Session Note  Patient Details  Name: MIKAELA HILGEMAN MRN: [id number] Date of Birth: [birth year]  Today's Date: 03/05/2024 SLP Individual Time: 1451-1530 SLP Individual Time Calculation (min): 39 min  Short Term Goals: Week 4: SLP Short Term Goal 1 (Week 4): Patient will consume least restrictive diet with use of compensatory strategies given supervision multimodal A SLP Short Term Goal 2 (Week 4): Patient will increase speech intelligibility to 90% at the sentence level given min multimodal A  Skilled Therapeutic Interventions: SLP conducted skilled therapy session targeting communication goals. SLP facilitated sentence and structured conversation level speech tasks with patient utilizing SLOP speech intelligibility strategies to increase intelligibility. Patient benefited from min assist to achieve 90% intelligibility at aforementioned utterance lengths, benefiting most from cues to increase volume at the end of sentences. Patient was left in room with call bell in reach and alarm set. SLP will continue to target goals per plan of care.        Pain Pain Assessment Pain Scale: 0-10 Pain Score: 0-No pain  Therapy/Group: Individual Therapy  Anakaren Campion, M.A., CCC-SLP  Wendy Mikles A Briella Hobday 03/05/2024, 3:33 PM

## 2024-03-05 NOTE — Progress Notes (Signed)
 Occupational Therapy Session Note  Patient Details  Name: Theresa Watkins MRN: [id number] Date of Birth: 12-15-1965  Today's Date: 03/05/2024 OT Individual Time: 0930-1030 OT Individual Time Calculation (min): 60 min    Short Term Goals: Week 4:  OT Short Term Goal 1 (Week 4): Pt will complete LB dressing tasks with mod A OT Short Term Goal 2 (Week 4): Pt will complete UB dressing tasks with mod A OT Short Term Goal 3 (Week 4): Pt will complete toileting tasks with mod A  Skilled Therapeutic Interventions/Progress Updates:    Skilled OT intervention with focus on bed mobility, functional transfers, toileting, bathing at shower level, and dressing with sit<>stand from w/c. Supine>sit EOB with min A and max verbal cues for techniwue. Toilet tranfsers with min A using grab bar. Toileting with mod A. Pt amb with HHA from toilet to TTB with mod A and max verbal cues to advance LLE. Bathing with min A and max verbal cues for safety. Dressing with mod A sit<>stand from w/c. Pt continues to exhibit increased impulsivity and unsafe behaviors especially during transfitional movements. Pt continues to require max verbal cues for hemi dressing techniques. Squat pivot transfer w/c.EOB with min A. Pt remained in w/c with all needs within reach. Bed alarm activated.   Therapy Documentation Precautions:  Precautions Precautions: Fall Precaution/Restrictions Comments: L hemi Restrictions Weight Bearing Restrictions Per Provider Order: No    Pain: Pt denies pain this morning   Therapy/Group: Individual Therapy  Maritza Debby Mare 03/05/2024, 11:25 AM

## 2024-03-05 NOTE — Progress Notes (Signed)
 Physical Therapy Weekly Progress Note  Patient Details  Name: Theresa Watkins MRN: [id number] Date of Birth: 1966-10-05  Beginning of progress report period: February 26, 2024 End of progress report period: March 05, 2024  Today's Date: 03/05/2024 PT Individual Time: 1045-1200 PT Individual Time Calculation (min): 75 min   Patient has met 0 of 3 short term goals.  Pt is making slow progress toward LTGs, however, is still limited by impulsivity and LLE strength/ataxia. Participation has been limited this week d/t frequent headaches and intermittent nausea causing her to decline therapies. She continues to require min a for Stand pivot transfer and mod HHA for short distance gait. W/c propulsion progressing with BLE. Pt is now pending SNF d/c as she will require full time supervision at home for safety.   Patient continues to demonstrate the following deficits muscle weakness, abnormal tone and ataxia, oculomotor deficits, and decreased sitting balance, decreased standing balance, decreased postural control, hemiplegia, and decreased balance strategies and therefore will continue to benefit from skilled PT intervention to increase functional independence with mobility.  Patient progressing toward long term goals..  Continue plan of care.  PT Short Term Goals Week 4:  PT Short Term Goal 1 (Week 4): =LTGs d/t ELOS  Skilled Therapeutic Interventions/Progress Updates:    pt received in bed and agreeable to therapy. No complaint of pain. Supine>sit with supervision, donned shoes tot a for time.   Stand pivot transfer with HHA mod a to w/c. Pt began propelling w.c with BLE, but reports feeling dizziness and increased blurred vision. Vitals found to be within normal limits. Symptoms lessened in quiet environment of room. Pt noted to have resting nystagmus and on further investigation, pt reports blurred vision consistent with oscillopsia. Pt reports onset of dizziness with visual scanning/saccades and  VOR head movements. Plan to further assess on later date.   Pt then participated in Sit to stand and marching practice with RUE positioned on chair. Therapist provided knee block and tactile/verbal cueing for LLE knee and hip extension. Min a overall, but pt requires constant cueing to focus on LLE in order to maintain knee and hip extension. When she pauses to focus on contracting quad she is able to maintain without assist, but her tendency is to move quickly while alternating feet vs giving time to perform the movement well, and sinking into knee flexion and eventually into sitting.   Pt returned to bed with min a squat pivot using bed rail, was left with all needs in reach and alarm active.    Therapy Documentation Precautions:  Precautions Precautions: Fall Precaution/Restrictions Comments: L hemi Restrictions Weight Bearing Restrictions Per Provider Order: No General:      Therapy/Group: Individual Therapy  Schuyler JAYSON Batter 03/05/2024, 1:14 PM

## 2024-03-05 NOTE — Plan of Care (Signed)
  Problem: RH BOWEL ELIMINATION Goal: RH STG MANAGE BOWEL WITH ASSISTANCE Description: STG Manage Bowel with toileting Assistance. Outcome: Progressing Goal: RH STG MANAGE BOWEL W/MEDICATION W/ASSISTANCE Description: STG Manage Bowel with Medication with mod I Assistance. Outcome: Progressing   Problem: RH SAFETY Goal: RH STG ADHERE TO SAFETY PRECAUTIONS W/ASSISTANCE/DEVICE Description: STG Adhere to Safety Precautions With cues Assistance/Device. Outcome: Progressing   Problem: RH PAIN MANAGEMENT Goal: RH STG PAIN MANAGED AT OR BELOW PT'S PAIN GOAL Description: Pain managed < 4 with prns Outcome: Progressing

## 2024-03-05 NOTE — Progress Notes (Addendum)
 Patient ID: Theresa Watkins, female   DOB: 1966-06-20, 58 y.o.   MRN: [id number]  [name] declined in [facility] [employer].   SW spoke with [name]/Admissions with [facility] (p:[phone] /f: [phone]) to discuss referral. Reports she will review. SW faxed clinicals and now waiting on follow-up.   SW spoke with covering admissions team member for [name] at [facility] and waiting on follow-up. *declined as insurance not in network.   SW spoke with [name]/Admissions with [facility] and reports full at this time and only accepting accommodating patients within their community.   SW left message for [name]/Admissions with [facility] to discuss referral and waiting on follow-up.   Graeme Jude, MSW, LCSW Office: [phone] Cell: [phone] Fax: [phone]

## 2024-03-06 MED ORDER — HYDROCODONE-ACETAMINOPHEN 5-325 MG PO TABS
1.0000 | ORAL_TABLET | Freq: Four times a day (QID) | ORAL | Status: DC | PRN
  Administered 2024-03-06 – 2024-03-11 (×4): 1 via ORAL
  Filled 2024-03-06 (×4): qty 1

## 2024-03-06 NOTE — Plan of Care (Signed)
  Problem: Consults Goal: RH STROKE PATIENT EDUCATION Description: See Patient Education module for education specifics  Outcome: Progressing   Problem: RH BOWEL ELIMINATION Goal: RH STG MANAGE BOWEL WITH ASSISTANCE Description: STG Manage Bowel with toileting Assistance. Outcome: Progressing Goal: RH STG MANAGE BOWEL W/MEDICATION W/ASSISTANCE Description: STG Manage Bowel with Medication with mod I Assistance. Outcome: Progressing   Problem: RH SAFETY Goal: RH STG ADHERE TO SAFETY PRECAUTIONS W/ASSISTANCE/DEVICE Description: STG Adhere to Safety Precautions With cues Assistance/Device. Outcome: Progressing   Problem: RH PAIN MANAGEMENT Goal: RH STG PAIN MANAGED AT OR BELOW PT'S PAIN GOAL Description: Pain managed < 4 with prns Outcome: Progressing   Problem: RH KNOWLEDGE DEFICIT Goal: RH STG INCREASE KNOWLEDGE OF HYPERTENSION Description: Patient and mother will be able to manage HTN using educational resources for medications and dietary modification independently Outcome: Progressing Goal: RH STG INCREASE KNOWLEDGE OF DYSPHAGIA/FLUID INTAKE Description: Patient and mother will be able to manage dysphagia using educational resources for medications and dietary modification independently Outcome: Progressing Goal: RH STG INCREASE KNOWLEGDE OF HYPERLIPIDEMIA Description: Patient and mother will be able to manage HLD using educational resources for medications and dietary modification independently Outcome: Progressing Goal: RH STG INCREASE KNOWLEDGE OF STROKE PROPHYLAXIS Description: Patient and mother will be able to manage secondary risks using educational resources for medications and dietary modification independently Outcome: Progressing   Problem: Consults Goal: RH GENERAL PATIENT EDUCATION Description: See Patient Education module for education specifics. Outcome: Progressing Goal: Skin Care Protocol Initiated - if Braden Score 18 or less Description: If consults are  not indicated, leave blank or document N/A Outcome: Progressing Goal: Nutrition Consult-if indicated Outcome: Progressing Goal: Diabetes Guidelines if Diabetic/Glucose > 140 Description: If diabetic or lab glucose is > 140 mg/dl - Initiate Diabetes/Hyperglycemia Guidelines & Document Interventions  Outcome: Progressing   Problem: RH BOWEL ELIMINATION Goal: RH STG MANAGE BOWEL WITH ASSISTANCE Description: STG Manage Bowel with Assistance. Outcome: Progressing Goal: RH STG MANAGE BOWEL W/MEDICATION W/ASSISTANCE Description: STG Manage Bowel with Medication with Assistance. Outcome: Progressing   Problem: RH BLADDER ELIMINATION Goal: RH STG MANAGE BLADDER WITH ASSISTANCE Description: STG Manage Bladder With Assistance Outcome: Progressing Goal: RH STG MANAGE BLADDER WITH MEDICATION WITH ASSISTANCE Description: STG Manage Bladder With Medication With Assistance. Outcome: Progressing Goal: RH STG MANAGE BLADDER WITH EQUIPMENT WITH ASSISTANCE Description: STG Manage Bladder With Equipment With Assistance Outcome: Progressing   Problem: RH SKIN INTEGRITY Goal: RH STG SKIN FREE OF INFECTION/BREAKDOWN Outcome: Progressing Goal: RH STG MAINTAIN SKIN INTEGRITY WITH ASSISTANCE Description: STG Maintain Skin Integrity With Assistance. Outcome: Progressing Goal: RH STG ABLE TO PERFORM INCISION/WOUND CARE W/ASSISTANCE Description: STG Able To Perform Incision/Wound Care With Assistance. Outcome: Progressing   Problem: RH SAFETY Goal: RH STG ADHERE TO SAFETY PRECAUTIONS W/ASSISTANCE/DEVICE Description: STG Adhere to Safety Precautions With Assistance/Device. Outcome: Progressing Goal: RH STG DECREASED RISK OF FALL WITH ASSISTANCE Description: STG Decreased Risk of Fall With Assistance. Outcome: Progressing   Problem: RH PAIN MANAGEMENT Goal: RH STG PAIN MANAGED AT OR BELOW PT'S PAIN GOAL Outcome: Progressing   Problem: RH KNOWLEDGE DEFICIT GENERAL Goal: RH STG INCREASE KNOWLEDGE OF  SELF CARE AFTER HOSPITALIZATION Outcome: Progressing

## 2024-03-06 NOTE — Progress Notes (Signed)
 Speech Language Pathology Daily Session Note  Patient Details  Name: Theresa Watkins MRN: [id number] Date of Birth: [birth year]  Today's Date: 03/06/2024 SLP Individual Time: 0900-0930 SLP Individual Time Calculation (min): 30 min  Short Term Goals: Week 4: SLP Short Term Goal 1 (Week 4): Patient will consume least restrictive diet with use of compensatory strategies given supervision multimodal A SLP Short Term Goal 2 (Week 4): Patient will increase speech intelligibility to 90% at the sentence level given min multimodal A  Skilled Therapeutic Interventions:   Pt was seen for skilled ST services targeting speech intelligibility. Pt was sleeping upon SLP arrival, but was agreeable to therapy services. Pt intelligibility noted to be ~70% in informal conversation to an unfamiliar listener. Pt was able to recall 2/4 SLOP strategies re: loud, over-articulate. Around 15 minutes in to session, pt reported initiation of migraine. SLP turned off lights in room to see if this was helpful. Pt migraine worsened to 8/10. NSG notified. SLP discontinued session. Pt was left in room, with all immediate needs within reach, and all safety measures activated.   Pain Pain Assessment Pain Scale: 0-10 Pain Score: 8  Pain Type: Acute pain Pain Location: Head Pain Intervention(s): Medication (See eMAR);RN made aware  Therapy/Group: Individual Therapy  Lenward Railing 03/06/2024, 9:34 AM

## 2024-03-06 NOTE — Plan of Care (Signed)
  Problem: RH SAFETY Goal: RH STG ADHERE TO SAFETY PRECAUTIONS W/ASSISTANCE/DEVICE Description: STG Adhere to Safety Precautions With cues Assistance/Device. Outcome: Progressing   Problem: RH PAIN MANAGEMENT Goal: RH STG PAIN MANAGED AT OR BELOW PT'S PAIN GOAL Description: Pain managed < 4 with prns Outcome: Progressing   Problem: RH SAFETY Goal: RH STG ADHERE TO SAFETY PRECAUTIONS W/ASSISTANCE/DEVICE Description: STG Adhere to Safety Precautions With cues Assistance/Device. Outcome: Progressing   Problem: RH PAIN MANAGEMENT Goal: RH STG PAIN MANAGED AT OR BELOW PT'S PAIN GOAL Description: Pain managed < 4 with prns Outcome: Progressing   Problem: RH KNOWLEDGE DEFICIT Goal: RH STG INCREASE KNOWLEDGE OF HYPERTENSION Description: Patient and mother will be able to manage HTN using educational resources for medications and dietary modification independently Outcome: Progressing Goal: RH STG INCREASE KNOWLEDGE OF DYSPHAGIA/FLUID INTAKE Description: Patient and mother will be able to manage dysphagia using educational resources for medications and dietary modification independently Outcome: Progressing Goal: RH STG INCREASE KNOWLEGDE OF HYPERLIPIDEMIA Description: Patient and mother will be able to manage HLD using educational resources for medications and dietary modification independently Outcome: Progressing

## 2024-03-06 NOTE — Progress Notes (Signed)
 Physical Therapy Session Note  Patient Details  Name: Theresa Watkins MRN: [id number] Date of Birth: [birth year]  Today's Date: 03/06/2024 PT Individual Time: 1100-      Short Term Goals: Week 4:  PT Short Term Goal 1 (Week 4): =LTGs d/t ELOS  Skilled Therapeutic Interventions/Progress Updates:    Chart reviewed. Pt found in bed with c/o 10/10 migraine pain. PT explained importance of therapy and need to make up session if missed. PT again offered session. Pt again politely declined 2/2 migraine pain. Pt missed 60 mins of therapy.     Therapy Documentation Precautions:  Precautions Precautions: Fall Precaution/Restrictions Comments: L hemi Restrictions Weight Bearing Restrictions Per Provider Order: No General: PT Amount of Missed Time (min): 60 Minutes PT Missed Treatment Reason: Patient ill (Comment) (reporting nausea and 10/10 migraine pain)  Therapy/Group: Individual Therapy  Hazeline Lister 03/06/2024, 11:58 AM

## 2024-03-07 DIAGNOSIS — I63511 Cerebral infarction due to unspecified occlusion or stenosis of right middle cerebral artery: Secondary | ICD-10-CM | POA: Diagnosis not present

## 2024-03-07 NOTE — Progress Notes (Signed)
 Occupational Therapy Note  Patient Details  Name: Theresa Watkins MRN: [id number] Date of Birth: 1966-01-31  Today's Date: 03/07/2024 OT Missed Time: 45 Minutes Missed Time Reason: Patient ill (comment);Pain (Patient indicated that she had a migraine and that she was  not able to participate. I returned to her for my last session and she said that she wasn't feeling up to it.)  Nursing was advised with each occurrence .   Moises Ang 03/07/2024, 4:09 PM

## 2024-03-07 NOTE — Progress Notes (Signed)
 Physical Therapy Session Note  Patient Details  Name: Theresa Watkins MRN: [id number] Date of Birth: [birth year]  Today's Date: 03/07/2024 PT Individual Time: 1102-1157 PT Individual Time Calculation (min): 55 min   Short Term Goals: Week 1:  PT Short Term Goal 1 (Week 1): Pt will perform squat pivot tx with CGA bed<>chair PT Short Term Goal 1 - Progress (Week 1): Met PT Short Term Goal 2 (Week 1): Pt will ambulate 77' with maxA and LRAD PT Short Term Goal 2 - Progress (Week 1): Met PT Short Term Goal 3 (Week 1): Pt will propel w/c 150' with SBA PT Short Term Goal 3 - Progress (Week 1): Met  Skilled Therapeutic Interventions/Progress Updates:  Pt was seen bedside in the am. Pt required min A to assist with donning pants. Pt transferred supine to edge of bed with min A and verbal cues. Pt transferred edge of bed to w/c with min A and verbal cues. Pt propelled w/c about 150 feet, pt able to propel w/c about 50 feet with S and R LE then required occasional min A and verbal cues for last 100 feet. Pt performed LE exercises seated in w/c, 3 sets x 10 reps each, AROM R LE and AAROM L LE. Pt performed multiple sit to stand transfers in parallel bars with min A and verbal cues While standing treatment focused on upright posture, midline and weight shifting. Pt returned to room. Pt transferred w/c to edge of bed with min A and verbal cues. Pt transferred edge of bed to supine with S. Pt left sitting up in bed with bed alarm and call bell within reach.   Therapy Documentation Precautions:  Precautions Precautions: Fall Precaution/Restrictions Comments: L hemi Restrictions Weight Bearing Restrictions Per Provider Order: No General:   Pain: No c/o pain.   Therapy/Group: Individual Therapy  [name] G 03/07/2024, 12:11 PM

## 2024-03-07 NOTE — Plan of Care (Signed)
  Problem: Consults Goal: RH STROKE PATIENT EDUCATION Description: See Patient Education module for education specifics  Outcome: Progressing   Problem: RH BOWEL ELIMINATION Goal: RH STG MANAGE BOWEL WITH ASSISTANCE Description: STG Manage Bowel with toileting Assistance. Outcome: Progressing Goal: RH STG MANAGE BOWEL W/MEDICATION W/ASSISTANCE Description: STG Manage Bowel with Medication with mod I Assistance. Outcome: Progressing   Problem: RH SAFETY Goal: RH STG ADHERE TO SAFETY PRECAUTIONS W/ASSISTANCE/DEVICE Description: STG Adhere to Safety Precautions With cues Assistance/Device. Outcome: Progressing   Problem: RH PAIN MANAGEMENT Goal: RH STG PAIN MANAGED AT OR BELOW PT'S PAIN GOAL Description: Pain managed < 4 with prns Outcome: Progressing   Problem: RH KNOWLEDGE DEFICIT Goal: RH STG INCREASE KNOWLEDGE OF HYPERTENSION Description: Patient and mother will be able to manage HTN using educational resources for medications and dietary modification independently Outcome: Progressing Goal: RH STG INCREASE KNOWLEDGE OF DYSPHAGIA/FLUID INTAKE Description: Patient and mother will be able to manage dysphagia using educational resources for medications and dietary modification independently Outcome: Progressing Goal: RH STG INCREASE KNOWLEGDE OF HYPERLIPIDEMIA Description: Patient and mother will be able to manage HLD using educational resources for medications and dietary modification independently Outcome: Progressing Goal: RH STG INCREASE KNOWLEDGE OF STROKE PROPHYLAXIS Description: Patient and mother will be able to manage secondary risks using educational resources for medications and dietary modification independently Outcome: Progressing   Problem: Consults Goal: RH GENERAL PATIENT EDUCATION Description: See Patient Education module for education specifics. Outcome: Progressing Goal: Skin Care Protocol Initiated - if Braden Score 18 or less Description: If consults are  not indicated, leave blank or document N/A Outcome: Progressing Goal: Nutrition Consult-if indicated Outcome: Progressing Goal: Diabetes Guidelines if Diabetic/Glucose > 140 Description: If diabetic or lab glucose is > 140 mg/dl - Initiate Diabetes/Hyperglycemia Guidelines & Document Interventions  Outcome: Progressing   Problem: RH BOWEL ELIMINATION Goal: RH STG MANAGE BOWEL WITH ASSISTANCE Description: STG Manage Bowel with Assistance. Outcome: Progressing Goal: RH STG MANAGE BOWEL W/MEDICATION W/ASSISTANCE Description: STG Manage Bowel with Medication with Assistance. Outcome: Progressing   Problem: RH BLADDER ELIMINATION Goal: RH STG MANAGE BLADDER WITH ASSISTANCE Description: STG Manage Bladder With Assistance Outcome: Progressing Goal: RH STG MANAGE BLADDER WITH MEDICATION WITH ASSISTANCE Description: STG Manage Bladder With Medication With Assistance. Outcome: Progressing Goal: RH STG MANAGE BLADDER WITH EQUIPMENT WITH ASSISTANCE Description: STG Manage Bladder With Equipment With Assistance Outcome: Progressing   Problem: RH SKIN INTEGRITY Goal: RH STG SKIN FREE OF INFECTION/BREAKDOWN Outcome: Progressing Goal: RH STG MAINTAIN SKIN INTEGRITY WITH ASSISTANCE Description: STG Maintain Skin Integrity With Assistance. Outcome: Progressing Goal: RH STG ABLE TO PERFORM INCISION/WOUND CARE W/ASSISTANCE Description: STG Able To Perform Incision/Wound Care With Assistance. Outcome: Progressing   Problem: RH SAFETY Goal: RH STG ADHERE TO SAFETY PRECAUTIONS W/ASSISTANCE/DEVICE Description: STG Adhere to Safety Precautions With Assistance/Device. Outcome: Progressing Goal: RH STG DECREASED RISK OF FALL WITH ASSISTANCE Description: STG Decreased Risk of Fall With Assistance. Outcome: Progressing   Problem: RH PAIN MANAGEMENT Goal: RH STG PAIN MANAGED AT OR BELOW PT'S PAIN GOAL Outcome: Progressing   Problem: RH KNOWLEDGE DEFICIT GENERAL Goal: RH STG INCREASE KNOWLEDGE OF  SELF CARE AFTER HOSPITALIZATION Outcome: Progressing

## 2024-03-07 NOTE — Progress Notes (Signed)
 Occupational Therapy Session Note  Patient Details  Name: Theresa Watkins MRN: [id number] Date of Birth: [birth year]  Today's Date: 03/07/2024 OT Individual Time: 0915-1000 OT Individual Time Calculation (min): 45 min    Short Term Goals: Week 1:  OT Short Term Goal 1 (Week 1): Pt will complete PROM with LUE without VC OT Short Term Goal 1 - Progress (Week 1): Progressing toward goal OT Short Term Goal 2 (Week 1): Pt will use LUE as stabilizer during ADL tasks with Mod A OT Short Term Goal 2 - Progress (Week 1): Met OT Short Term Goal 3 (Week 1): Pt will complete shower transfer at Mod A with LRAD OT Short Term Goal 3 - Progress (Week 1): Progressing toward goal OT Short Term Goal 4 (Week 1): Pt will compplete UB dressing with himi technique at Mod A OT Short Term Goal 4 - Progress (Week 1): Met  Skilled Therapeutic Interventions/Progress Updates:    Patient resting in bed at the time of arrival. The pt indicated that she was unable to rest secondary to being uncomfortable in bed. The pt also stated that she was having pain with her hands and her head and that nursing was made aware.  The pt went on to say that nursing wasn't able to giving her anything at the time for pain. The pt was able to select her clothing item by verbalizing,  to the best of her ability what she desired to wear. The pt was able to transfer from bed LOF to the w/c ModA for ambulating to the chair placed adjacent to the bed. The pt was MaxA for donning her pants and shoes. The pt was transported to the sink area and was able to wash her face with MinA for wringing the water out of the face cloth.  The pt was transported to the gym and she tolerated manual manipulation of the scapular and surrounding structures.  The pt inidcated that her pain decrease significantly.  I instructed the pt to grash a 1lb dowel with BUE, the pt's L arm was supported and she was able to participate in AAROM of the LUE for shld flexion.   The pt  was transported back to her living quarters and was able to transfer to bed LOF by coming from sit to stand using the bed rail for coming in to standing at [facility].  The pt  was able to lower herself to EOB using the bed rail with CGA. The pt was able to manage BLE onto the bed with MinA. The pt was able to adjust herself up in the bed with MinA.  The call light and bedside table were both placed within reach with all additional needs addressed.    Therapy Documentation Precautions:  Precautions Precautions: Fall Precaution/Restrictions Comments: L hemi Restrictions Weight Bearing Restrictions Per Provider Order: No Therapy/Group: Individual Therapy  Moises Ang 03/07/2024, 12:54 PM

## 2024-03-07 NOTE — Plan of Care (Signed)
  Problem: Consults Goal: RH STROKE PATIENT EDUCATION Description: See Patient Education module for education specifics  Outcome: Progressing   Problem: RH BOWEL ELIMINATION Goal: RH STG MANAGE BOWEL WITH ASSISTANCE Description: STG Manage Bowel with toileting Assistance. Outcome: Progressing   Problem: RH SAFETY Goal: RH STG ADHERE TO SAFETY PRECAUTIONS W/ASSISTANCE/DEVICE Description: STG Adhere to Safety Precautions With cues Assistance/Device. Outcome: Progressing   Problem: Consults Goal: RH GENERAL PATIENT EDUCATION Description: See Patient Education module for education specifics. Outcome: Progressing   Problem: RH BLADDER ELIMINATION Goal: RH STG MANAGE BLADDER WITH ASSISTANCE Description: STG Manage Bladder With Assistance Outcome: Progressing   Problem: RH SKIN INTEGRITY Goal: RH STG SKIN FREE OF INFECTION/BREAKDOWN Outcome: Progressing   Problem: RH PAIN MANAGEMENT Goal: RH STG PAIN MANAGED AT OR BELOW PT'S PAIN GOAL Description: Pain managed < 4 with prns Outcome: Not Progressing Note: Pt has continued migraine and headache issues that have interferred with therapy and daily activities, pt states they just want to rest

## 2024-03-07 NOTE — Progress Notes (Signed)
 PROGRESS NOTE   Subjective/Complaints:  Pt reports bed is really bothering her back and "all over' is sore.   Got choked up eating just now, but is fine now-  LBM large this AM  ROS:   Pt denies SOB, abd pain, CP, N/V/C/D, and vision changes   Objective:   No results found.   No results for input(s): "WBC", "HGB", "HCT", "PLT" in the last 72 hours.    No results for input(s): "NA", "K", "CL", "CO2", "GLUCOSE", "BUN", "CREATININE", "CALCIUM " in the last 72 hours.     Intake/Output Summary (Last 24 hours) at 03/07/2024 1034 Last data filed at 03/07/2024 0809 Gross per 24 hour  Intake 465.5 ml  Output --  Net 465.5 ml         Physical Exam: Vital Signs Blood pressure 123/86, pulse 70, temperature 98.2 F (36.8 C), resp. rate 17, height 5\' 6"  (1.676 m), weight 64.4 kg, SpO2 97%.     General: awake, alert, appropriate, sitting up in bed- was eating, but stopped to speak with me; NAD HENT: conjugate gaze; oropharynx moist CV: regular rate and rhythm; no JVD Pulmonary: CTA B/L; no W/R/R- good air movement- initially choked, but lungs clear GI: soft, NT, ND, (+)BS- normoactive Psychiatric: appropriate- interactive Neurological: significant dysarthria- is improving somewhat- still a little impulsive  Neurological: fairly alert. Speech remains quite dysarthric.   MAS of 1  in L shoulder and 1 in L elbow- none in L wrist or fingers so far- Hoffman's (+) in LUE- no change today since meds have worn off overnight  Mild to mod dysarthria.  Left central 7  Strength exam- LUE 2- to 2/5  LLE 3 to 3+/5.  -sensory exam normal. Prior neuro assessment is c/w today's exam 03/07/2024.  Skin- L PIP of 2nd digit- has very large scab vs plaque    Musculoskeletal:  Left hip pain with IR/ER. Moved LLE easily in bed (flex/ext) without discomfort.     Assessment/Plan: 1. Functional deficits which require 3+ hours per day of  interdisciplinary therapy in a comprehensive inpatient rehab setting. Physiatrist is providing close team supervision and 24 hour management of active medical problems listed below. Physiatrist and rehab team continue to assess barriers to discharge/monitor patient progress toward functional and medical goals  Care Tool:  Bathing    Body parts bathed by patient: Face, Left arm, Chest, Abdomen, Front perineal area, Buttocks, Right upper leg, Left upper leg, Right lower leg   Body parts bathed by helper: Right arm, Left lower leg     Bathing assist Assist Level: Minimal Assistance - Patient > 75%     Upper Body Dressing/Undressing Upper body dressing   What is the patient wearing?: Pull over shirt    Upper body assist Assist Level: Moderate Assistance - Patient 50 - 74%    Lower Body Dressing/Undressing Lower body dressing      What is the patient wearing?: Pants, Underwear/pull up     Lower body assist Assist for lower body dressing: Moderate Assistance - Patient 50 - 74%     Toileting Toileting    Toileting assist Assist for toileting: Moderate Assistance - Patient 50 - 74%  Transfers Chair/bed transfer  Transfers assist     Chair/bed transfer assist level: Minimal Assistance - Patient > 75%     Locomotion Ambulation   Ambulation assist      Assist level: Moderate Assistance - Patient 50 - 74% Assistive device: Hand held assist Max distance: 30   Walk 10 feet activity   Assist     Assist level: Moderate Assistance - Patient - 50 - 74% Assistive device: Hand held assist   Walk 50 feet activity   Assist Walk 50 feet with 2 turns activity did not occur: Safety/medical concerns (L hemiplegia and pt fatigue)  Assist level: 2 helpers      Walk 150 feet activity   Assist Walk 150 feet activity did not occur: Safety/medical concerns (L hemiplegia and pt fatigue)         Walk 10 feet on uneven surface  activity   Assist Walk 10 feet  on uneven surfaces activity did not occur: Safety/medical concerns (L hemiplegia and pt fatigue)         Wheelchair     Assist Is the patient using a wheelchair?: Yes Type of Wheelchair: Manual    Wheelchair assist level: Supervision/Verbal cueing Max wheelchair distance: 150    Wheelchair 50 feet with 2 turns activity    Assist        Assist Level: Supervision/Verbal cueing   Wheelchair 150 feet activity     Assist      Assist Level: Supervision/Verbal cueing   Blood pressure 123/86, pulse 70, temperature 98.2 F (36.8 C), resp. rate 17, height 5\' 6"  (1.676 m), weight 64.4 kg, SpO2 97%.   Medical Problem List and Plan: 1. 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 SW and I have spoken with mother and son at length this week. They cannot provide needed care.   Con't CIR 2. DVT/anticoagulation:  Pharmaceutical: Lovenox              -antiplatelet therapy: Aspirin  81 mg daily and Plavix  75 mg daily x 3 weeks then Plavix  alone 3. Pain Management: Tylenol  as needed  3/25- denies pain- con't regimen rpn   3-30: Ongoing left hip pain; Voltaren  gel and Tylenol .  3/31- will start Vit D and Ca per pt request- has helped hip pain in past?  4/1- pt insistent was very helpful already for hip pain  4/2- pt wants at the same time- per chart, Ca and Vit D come at same time  4/3- having new burning pain in LUE- which is seen sometimes with stroke- will add Duloxetine  30 mg daily for nerve pain- monitor for side effects.  -if gets nausea, will change to Lyrica 4/4-6 no side effects so far-  4/8- will increase Duloxetine  to 60 mg daily      4. Mood/Behavior/Sleep: Provide emotional support  4/10- mood  slightly better today  4/11- mood also looked slightly better this AM             -antipsychotic agents: N/A 5. Neuropsych/cognition: This patient is capable of making decisions on her own behalf. 6. Skin/Wound Care: Routine skin checks 7. Fluids/Electrolytes/Nutrition:encouraging po.   4/17 recent lab work wnl.  8.  Dysphagia.  Dysphagia 3 with thins currently  4/18 intake ok    9.  Hyperlipidemia.  Crestor  10.  Recent History of cocaine/tobacco use.  UDS positive for cocaine.  Provide counseling, this has been a chronic issue , seen in PCP notes 11.  Hypothyroidism. continue Synthroid   12.  Constipation.   Senokot-S 2 tab twice daily   LBM 4/15   4/20- LBM this AM 15. Blurry vision  3/27- will need Ophtho after d/c.   3-29: Seems to mostly be from the left eye, with difficulty converging and tracking; messaged OT and PT to trial taping versus alternating eye patch to help with convergence.  3-30: Reporting improvement with use of taping  4/4- pt reports everything blurry- cannot see faces as a result- never had eye checked in past- used readers but cannot see at a distance since stroke- will send to Ophtho after d/c.   4/7- d/w pt again- she wants eyeglasses- explained we cannot get when in hospital- also per guidelines, since vision can still change, to not get for 3 months after stroke. She disbelieves it's from stroke, since had 2 prior- that didn't cause vision changes- we educated pt pn how this can occur.   16.  Left hip pain.  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 Severe HA-resolved  4/15-16 sx appear improved with topamax , tylenol  helps also   -may use sx to get out of therapy at times?  18. Decreased safety awareness/impulsiveness    19.  Depression  Continue Duloxetine  60mg /d  20. Spasticity  4/18 tone appears controlled. Off baclofen , cont PT, OT  21. 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 .    22.  Urinary incont due to CVA, cont toileting program  23. Back pain  4/20- pt feels due ot bed- will d/w nursing   I spent a total of 39   minutes on total care today- >50% coordination of care- due to  D/w pt about plans- as well as choking episode she came out of very well- also d/w nursing about bed change- reviewed labs, note and vitals   LOS: 27 days A FACE TO FACE EVALUATION WAS PERFORMED  Colt Martelle 03/07/2024, 10:34 AM

## 2024-03-07 NOTE — Progress Notes (Addendum)
 On assessment at 1317 patient pain score 2 for body, head fine, no request for medication, at 1415 PT informs that patient states migraine of 10 , tells PT that they dont want to do therapy because of pain,  ( the same complaint was given to therapy on 4/19), Nurse gave prn breathrough Norco pain medication and therapy agrees to come back within 30 min to let medication kick in, pt agrees to this  At 1610 Phystical therapy notified nurse that pt refused therapy.  At 1615 Pt as well offered voltaren , pt refused medication every interval,  States repositioning helps more than most efforts, but doesn't like to be rolled on left side only right side, educated on q2h turns and offloading,

## 2024-03-08 DIAGNOSIS — R509 Fever, unspecified: Secondary | ICD-10-CM | POA: Diagnosis not present

## 2024-03-08 DIAGNOSIS — F329 Major depressive disorder, single episode, unspecified: Secondary | ICD-10-CM | POA: Diagnosis not present

## 2024-03-08 DIAGNOSIS — I69391 Dysphagia following cerebral infarction: Secondary | ICD-10-CM | POA: Diagnosis not present

## 2024-03-08 DIAGNOSIS — M792 Neuralgia and neuritis, unspecified: Secondary | ICD-10-CM | POA: Diagnosis not present

## 2024-03-08 DIAGNOSIS — I63511 Cerebral infarction due to unspecified occlusion or stenosis of right middle cerebral artery: Secondary | ICD-10-CM | POA: Diagnosis not present

## 2024-03-08 LAB — CBC WITH DIFFERENTIAL/PLATELET
Abs Immature Granulocytes: 0.02 10*3/uL (ref 0.00–0.07)
Basophils Absolute: 0 10*3/uL (ref 0.0–0.1)
Basophils Relative: 0 %
Eosinophils Absolute: 0.1 10*3/uL (ref 0.0–0.5)
Eosinophils Relative: 3 %
HCT: 44 % (ref 36.0–46.0)
Hemoglobin: 14.5 g/dL (ref 12.0–15.0)
Immature Granulocytes: 0 %
Lymphocytes Relative: 38 %
Lymphs Abs: 1.8 10*3/uL (ref 0.7–4.0)
MCH: 28.7 pg (ref 26.0–34.0)
MCHC: 33 g/dL (ref 30.0–36.0)
MCV: 87 fL (ref 80.0–100.0)
Monocytes Absolute: 0.4 10*3/uL (ref 0.1–1.0)
Monocytes Relative: 9 %
Neutro Abs: 2.3 10*3/uL (ref 1.7–7.7)
Neutrophils Relative %: 50 %
Platelets: 161 10*3/uL (ref 150–400)
RBC: 5.06 MIL/uL (ref 3.87–5.11)
RDW: 13.3 % (ref 11.5–15.5)
WBC: 4.7 10*3/uL (ref 4.0–10.5)
nRBC: 0 % (ref 0.0–0.2)

## 2024-03-08 LAB — COMPREHENSIVE METABOLIC PANEL WITH GFR
ALT: 32 U/L (ref 0–44)
AST: 19 U/L (ref 15–41)
Albumin: 3.2 g/dL — ABNORMAL LOW (ref 3.5–5.0)
Alkaline Phosphatase: 47 U/L (ref 38–126)
Anion gap: 9 (ref 5–15)
BUN: 16 mg/dL (ref 6–20)
CO2: 21 mmol/L — ABNORMAL LOW (ref 22–32)
Calcium: 8.5 mg/dL — ABNORMAL LOW (ref 8.9–10.3)
Chloride: 108 mmol/L (ref 98–111)
Creatinine, Ser: 0.8 mg/dL (ref 0.44–1.00)
GFR, Estimated: >60 mL/min (ref >60)
Glucose, Bld: 87 mg/dL (ref 70–99)
Potassium: 3.9 mmol/L (ref 3.5–5.1)
Sodium: 138 mmol/L (ref 135–145)
Total Bilirubin: 0.3 mg/dL (ref 0.0–1.2)
Total Protein: 6.1 g/dL — ABNORMAL LOW (ref 6.5–8.1)

## 2024-03-08 NOTE — Progress Notes (Signed)
 Speech Language Pathology Daily Session Note  Patient Details  Name: [name] MRN: [id number] Date of Birth: [birth year]  Today's Date: 03/08/2024 SLP Individual Time: 0801-0902; 0222p-0253p SLP Individual Time Calculation (min): 61 min; 31 min  Short Term Goals: Week 4: SLP Short Term Goal 1 (Week 4): Patient will consume least restrictive diet with use of compensatory strategies given supervision multimodal A SLP Short Term Goal 2 (Week 4): Patient will increase speech intelligibility to 90% at the sentence level given min multimodal A  Skilled Therapeutic Interventions:  Session 1: Patient was seen in am to address dysphagia management and speech intelligibility. Pt was alert and seen at bedside consuming breakfast meal. Assigned NT present and supervising. Pt demonstrating good use of strategies including limiting talking during PO intake, upright positioning, small single bites, and slow rate. Pt intermittently consuming thin liquid via cup edge with no s/s aspiration. In other minutes of session, SLP facilitating oral hygiene. SLP cleaned pt's dentures and pt provided thorough oral hygiene via suction toothbrush. Pt donned dentures and agreeable to continue session. Pt reporting stagnation of speech over the course of the week which she attributed to poor sleep. SLP addressed speech intelligibility at various levels; single word and sentence, then functional speech. Pt ~90-100% intelligible at the word level with occasional cues for over articulation. She was 90% intelligible at the sentence level again gien cues for over articulation. At conclusion of session, SLP began discussion about communication with unknown listeners over the phone. Pt produced intelligible speech when communicating her name, DOB, and address given sup to min A for over articulation and positioning. Pt left inbed with call button within reach and bed alarm active. SLP to continue POC.   Session 2: Pt was seen in  PM to address dysphagia management. Pt was alert upon SLP arrival and agreeable for session. Pt reported  dissatisfaction with lunch meal and subsequent hunger. She requested PO trials during session. SLP assisted in repositioning pt upright utilizing pillows to support positioning with pt ultimately able to achieve ~ 90 degrees.  Pt presented with D3 consistency. Pt demo improved use of rate and amount throughout snack with good oral clearance in all trials. Upon discussion with assigned NT, pt continues to benefit from intermittent cues for slowing pace during meals and taking small bites. Pt consumed sips of water intermittently throughout. She was without s/sx asp. Due to time constraints EMST and IOPI unable to be completed as planned. SLP providing instruction for pt to complete 5 sets of 5 at 30cm H2O this PM. Pt verbalized understanding. Pt left upright with bed alarm active and call button within reach.   Pain  Denies pain  Therapy/Group: Individual Therapy  Adela Holter 03/08/2024, 4:04 PM

## 2024-03-08 NOTE — Progress Notes (Signed)
 PROGRESS NOTE   Subjective/Complaints:  New bed is "SO MUCH BETTER".  Slept great, back feels better! Otherwise no new issues. Wants to rest today!  Working on articulation with SLP this morning.  ROS: Patient denies fever, rash, sore throat, blurred vision, dizziness, nausea, vomiting, diarrhea, cough, shortness of breath or chest pain,   headache, or mood change.    Objective:   No results found.   Recent Labs    03/08/24 0536  WBC 4.7  HGB 14.5  HCT 44.0  PLT 161      Recent Labs    03/08/24 0536  NA 138  K 3.9  CL 108  CO2 21*  GLUCOSE 87  BUN 16  CREATININE 0.80  CALCIUM  8.5*       Intake/Output Summary (Last 24 hours) at 03/08/2024 1038 Last data filed at 03/08/2024 0835 Gross per 24 hour  Intake 598.5 ml  Output --  Net 598.5 ml         Physical Exam: Vital Signs Blood pressure 117/74, pulse 70, temperature 97.8 F (36.6 C), temperature source Oral, resp. rate 16, height 5\' 6"  (1.676 m), weight 64.4 kg, SpO2 95%.     The causes and treatment of seasonal allergic rhinitis are discussed in detail. Allergen avoidance, use and side effects of OTC and prescription antihistamine decongestant products, and the use and side effects of inhaled nasal corticosteroids is reviewed. Allergy desensitization shots are reserved for severe and refractory cases.  Neurological: fairly alert. Speech remains quite dysarthric.   MAS of 1  in L shoulder and 1 in L elbow- none in L wrist or fingers so far- Hoffman's (+) in LUE- no change today since meds have worn off overnight  Mild to mod dysarthria.  Left central 7  Strength exam- LUE 2- to 2/5  LLE 3 to 3+/5.  -sensory exam normal. Prior neuro assessment is c/w today's exam 03/08/2024.  Skin- L PIP of 2nd digit- has very large scab vs plaque    Musculoskeletal:  Left hip pain with IR/ER. Moved LLE easily in bed (flex/ext) without discomfort.      Assessment/Plan: 1. Functional deficits which require 3+ hours per day of interdisciplinary therapy in a comprehensive inpatient rehab setting. Physiatrist is providing close team supervision and 24 hour management of active medical problems listed below. Physiatrist and rehab team continue to assess barriers to discharge/monitor patient progress toward functional and medical goals  Care Tool:  Bathing    Body parts bathed by patient: Face, Left arm, Chest, Abdomen, Front perineal area, Buttocks, Right upper leg, Left upper leg, Right lower leg   Body parts bathed by helper: Right arm, Left lower leg     Bathing assist Assist Level: Minimal Assistance - Patient > 75%     Upper Body Dressing/Undressing Upper body dressing   What is the patient wearing?: Pull over shirt    Upper body assist Assist Level: Moderate Assistance - Patient 50 - 74%    Lower Body Dressing/Undressing Lower body dressing      What is the patient wearing?: Pants, Underwear/pull up     Lower body assist Assist for lower body dressing: Moderate Assistance - Patient  50 - 74%     Toileting Toileting    Toileting assist Assist for toileting: Moderate Assistance - Patient 50 - 74%     Transfers Chair/bed transfer  Transfers assist     Chair/bed transfer assist level: Minimal Assistance - Patient > 75%     Locomotion Ambulation   Ambulation assist      Assist level: Moderate Assistance - Patient 50 - 74% Assistive device: Hand held assist Max distance: 30   Walk 10 feet activity   Assist     Assist level: Moderate Assistance - Patient - 50 - 74% Assistive device: Hand held assist   Walk 50 feet activity   Assist Walk 50 feet with 2 turns activity did not occur: Safety/medical concerns (L hemiplegia and pt fatigue)  Assist level: 2 helpers      Walk 150 feet activity   Assist Walk 150 feet activity did not occur: Safety/medical concerns (L hemiplegia and pt  fatigue)         Walk 10 feet on uneven surface  activity   Assist Walk 10 feet on uneven surfaces activity did not occur: Safety/medical concerns (L hemiplegia and pt fatigue)         Wheelchair     Assist Is the patient using a wheelchair?: Yes Type of Wheelchair: Manual    Wheelchair assist level: Supervision/Verbal cueing Max wheelchair distance: 150    Wheelchair 50 feet with 2 turns activity    Assist        Assist Level: Supervision/Verbal cueing   Wheelchair 150 feet activity     Assist      Assist Level: Minimal Assistance - Patient > 75%   Blood pressure 117/74, pulse 70, temperature 97.8 F (36.6 C), temperature source Oral, resp. rate 16, height 5\' 6"  (1.676 m), weight 64.4 kg, SpO2 95%.   Medical Problem List and Plan: 1. 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 Search for facility ongoing. May only be able to go to custodial care facility 2. DVT/anticoagulation:  Pharmaceutical: Lovenox              -antiplatelet therapy: Aspirin  81 mg daily and Plavix  75 mg daily x 3 weeks then Plavix  alone 3. Pain Management: Tylenol  as needed  3/25- denies pain- con't regimen rpn   3-30: Ongoing left hip pain; Voltaren  gel and Tylenol .  3/31- will start Vit D and Ca per pt request- has helped hip pain in past?  4/1- pt insistent was very helpful already for hip pain  4/2- pt wants at the same time- per chart, Ca and Vit D come at same time  4/3- having new burning pain in LUE- which is seen sometimes with stroke- will add Duloxetine  30 mg daily for nerve pain- monitor for side effects.  -if gets nausea, will change to Lyrica 4/4-6 no side effects so far-  4/8- will increase  Duloxetine  to 60 mg daily  4/21 back feels MUCH better with new mattress.    4. Depression/Behavior/Sleep: Provide emotional support  4/21 mood improved--continue duloxetine              -antipsychotic agents: N/A 5. Neuropsych/cognition: This patient is capable  of making decisions on her own behalf. 6. Skin/Wound Care: Routine skin checks 7. Fluids/Electrolytes/Nutrition:encouraging po.   4/21- I personally reviewed the patient's labs today.     -albumin still a bit low--encourage po/proteins. We have discussed improving intake 8.  Dysphagia.  Dysphagia 3 with thins currently  4/18 intake ok    9.  Hyperlipidemia.  Crestor  10.  Recent History of cocaine/tobacco use.  UDS positive for cocaine.  Provide counseling, this has been a chronic issue , seen in PCP notes 11.  Hypothyroidism. continue Synthroid   12.  Constipation.   Senokot-S 2 tab twice daily   LBM 4/15   4/20- LBM this AM 15. Blurry vision  3/27- will need Ophtho after d/c.   3-29: Seems to mostly be from the left eye, with difficulty converging and tracking; messaged OT and PT to trial taping versus alternating eye patch to help with convergence.  3-30: Reporting improvement with use of taping  4/4- pt reports everything blurry- cannot see faces as a result- never had eye checked in past- used readers but cannot see at a distance since stroke- will send to Ophtho after d/c.   4/7- d/w pt again- she wants eyeglasses- explained we cannot get when in hospital- also per guidelines, since vision can still change, to not get for 3 months after stroke. She disbelieves it's from stroke, since had 2 prior- that didn't cause vision changes- we educated pt pn how this can occur.   16.  Left hip pain.  -3-29 x-ray showing moderate degenerative joint disease of the hip; no fracture  -Continue Voltaren  gel to hip 4 times daily  -May benefit from steroid injection as an outpatient  3/31- started Vit D and Ca per pt request  - improved to  an extent   17. Severe HA-resolved  4/15-16 sx appear improved with topamax , tylenol  helps also   -may use sx to get out of therapy at times?  18. Decreased safety awareness/impulsiveness   20. Spasticity  4/18 tone appears controlled. Off baclofen , cont PT, OT  21. 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 .    22.  Urinary incont due to CVA, cont toileting program        LOS: 28 days A FACE TO FACE EVALUATION WAS PERFORMED  Rawland Caddy 03/08/2024, 10:38 AM

## 2024-03-08 NOTE — Progress Notes (Addendum)
 Patient ID: Theresa Watkins, female   DOB: [birth year], 57 y.o.   MRN: [id number]  SW spoke with [name]/Admissions with [facility] to discuss referral and reports it has been received and given to DON. Will discuss in morning meeting today. SW will follow-up.   SW left message for [name]/Admissions with [facility] and waiting on follow-up.   SW received a letter reporting pt is no loner covered from 4/21 as she is improving and she does not need to be in CIR while awaiting for placement. Peer to peer [phone] ext [phone]. reference [id number].   SW informed medical team.   12- SW spoke with pt son Vonii to inform on above details. SW explained appeal process with insurance if needed. SW will follow-up once more information.   Norval Been, MSW, LCSW Office: [phone] Cell: [phone] Fax: [phone]

## 2024-03-08 NOTE — Progress Notes (Signed)
 Occupational Therapy Session Note  Patient Details  Name: Theresa Watkins MRN: [id number] Date of Birth: Jan 17, 1966  Today's Date: 03/08/2024 OT Individual Time: 0930-1015 OT Individual Time Calculation (min): 45 min    Short Term Goals: Week 4:  OT Short Term Goal 1 (Week 4): Pt will complete LB dressing tasks with mod A OT Short Term Goal 2 (Week 4): Pt will complete UB dressing tasks with mod A OT Short Term Goal 3 (Week 4): Pt will complete toileting tasks with mod A  Skilled Therapeutic Interventions/Progress Updates:    SKilled OT intervention with focus on functional transfers, toileting, bathing at shower level, and safety awareness to increase pt's independence with BADLs. Pt continues to require max verbal cues for safety awarness during all activities. All transfers with min A. Bathing with min A seated on TTB. Toileitng with mod A seated on BSC over toilet. Pt continues to demo impulsitiviy during transitional movements and activities. Pt remained in bed awaiting NT to assist with dressing this morniong. Bed alarm activated.   Therapy Documentation Precautions:  Precautions Precautions: Fall Precaution/Restrictions Comments: L hemi Restrictions Weight Bearing Restrictions Per Provider Order: No   Pain:  Pt denies pain this morning   Therapy/Group: Individual Therapy  [name] 03/08/2024, 12:44 PM

## 2024-03-08 NOTE — Progress Notes (Signed)
 Physical Therapy Session Note  Patient Details  Name: Theresa Watkins MRN: [id number] Date of Birth: 03-09-1966  Today's Date: 03/08/2024 PT Individual Time: 1130-1200 PT Individual Time Calculation (min): 30 min   Short Term Goals: Week 4:  PT Short Term Goal 1 (Week 4): =LTGs d/t ELOS  Skilled Therapeutic Interventions/Progress Updates:    pt received in bed and agreeable to therapy. Pt reports "the normal" pain (shoulder, arm, and hip), but declines intervention. Rest and positioning as needed. Pt able to perform supine>sit with supervision and VC for flexion/rotation technique. Pt performed mod a Stand pivot transfer with HHA and pt sitting partially sideways into chair. Session focused on block practice of tranfers to increase safety and independence. Pt states need to wake up, so propelled w/c with BLE to increase arousal via exercise x ~60 ft. Pt then transported remaining distance to gym. Stand pivot transfer with min-mod a using HHA. Discussed setting up with lead foot placed forward to facilitate fewer steps required and working towards improved R weight shift in order to improve L foot clearance. Pt then returned to room and performed min a Stand pivot transfer using bed rail, returned to supine with supervision, was left with all needs in reach and alarm active.   Therapy Documentation Precautions:  Precautions Precautions: Fall Precaution/Restrictions Comments: L hemi Restrictions Weight Bearing Restrictions Per Provider Order: No General:       Therapy/Group: Individual Therapy  Tex Filbert 03/08/2024, 12:14 PM

## 2024-03-09 ENCOUNTER — Inpatient Hospital Stay (HOSPITAL_COMMUNITY)

## 2024-03-09 DIAGNOSIS — M1812 Unilateral primary osteoarthritis of first carpometacarpal joint, left hand: Secondary | ICD-10-CM | POA: Diagnosis not present

## 2024-03-09 DIAGNOSIS — M25532 Pain in left wrist: Secondary | ICD-10-CM | POA: Diagnosis not present

## 2024-03-09 DIAGNOSIS — I63511 Cerebral infarction due to unspecified occlusion or stenosis of right middle cerebral artery: Secondary | ICD-10-CM | POA: Diagnosis not present

## 2024-03-09 NOTE — Progress Notes (Signed)
 Patient ID: Theresa Watkins, female   DOB: July 25, 1966, 58 y.o.   MRN: [id number]  Per attending, P2P scheduled for tomorrow.   SW spoke with pt son Vonni tp inform on above.SW provided updates from team conference, and d/c is pending insurance approval to remain in CIR, SNF bed/insurance approval, etc. Reports he and grandmother intend to look for facilities today, and will be coming by the hospital to discuss going to SNF with his mother. SW will follow-up once there is more information.   Norval Been, MSW, LCSW Office: [phone] Cell: [phone] Fax: [phone]

## 2024-03-09 NOTE — Progress Notes (Signed)
 PROGRESS NOTE   Subjective/Complaints:  Pt reports has spasticity- feels like L wrist is broken- hasn't explained this before/over the weekend.   Also doesn't want to take Baclofen - said tried it why I was gone- and got confused.   Said caused mind alternation.  LBM 2 days ago  ROS:   Pt denies SOB, abd pain, CP, N/V/C/D, and vision changes  (+) L wrist pain   Objective:   No results found.   Recent Labs    03/08/24 0536  WBC 4.7  HGB 14.5  HCT 44.0  PLT 161      Recent Labs    03/08/24 0536  NA 138  K 3.9  CL 108  CO2 21*  GLUCOSE 87  BUN 16  CREATININE 0.80  CALCIUM  8.5*       Intake/Output Summary (Last 24 hours) at 03/09/2024 1610 Last data filed at 03/09/2024 0800 Gross per 24 hour  Intake 720 ml  Output --  Net 720 ml         Physical Exam: Vital Signs Blood pressure 105/67, pulse 69, temperature 98.5 F (36.9 C), temperature source Oral, resp. rate 17, height 5\' 6"  (1.676 m), weight 64.4 kg, SpO2 97%.      General: awake, alert, appropriate,  just woke her up; NAD HENT: conjugate gaze; oropharynx moist CV: regular rate and rhythm; no JVD Pulmonary: CTA B/L; no W/R/R- good air movement GI: soft, NT, ND, (+)BS- hypoactive Psychiatric: appropriate but sleepy Neurological: still moderate dysarthria Neurological: fairly alert. Speech remains quite dysarthric.   MAS of 1  in L shoulder and 1 in L elbow- none in L wrist or fingers so far- Hoffman's (+) in LUE- no change today since meds have worn off overnight  Mild to mod dysarthria.  Left central 7  Strength exam- LUE 2- to 2/5  LLE 3 to 3+/5.  -sensory exam normal. Prior neuro assessment is c/w today's exam 03/09/2024.  Skin- L PIP of 2nd digit- has very large scab vs plaque    Musculoskeletal:  Left hip pain with IR/ER. Moved LLE easily in bed (flex/ext) without discomfort.  L wrist TTP across dorsum of L wrist- and ulnar  styloid- pain with ROM of L wrist- in all directions. No heat, but questionable mild erythema.    Assessment/Plan: 1. Functional deficits which require 3+ hours per day of interdisciplinary therapy in a comprehensive inpatient rehab setting. Physiatrist is providing close team supervision and 24 hour management of active medical problems listed below. Physiatrist and rehab team continue to assess barriers to discharge/monitor patient progress toward functional and medical goals  Care Tool:  Bathing    Body parts bathed by patient: Face, Left arm, Chest, Abdomen, Front perineal area, Buttocks, Right upper leg, Left upper leg, Right lower leg, Left lower leg   Body parts bathed by helper: Right arm     Bathing assist Assist Level: Minimal Assistance - Patient > 75%     Upper Body Dressing/Undressing Upper body dressing   What is the patient wearing?: Pull over shirt    Upper body assist Assist Level: Moderate Assistance - Patient 50 - 74%    Lower Body Dressing/Undressing Lower  body dressing      What is the patient wearing?: Pants, Underwear/pull up     Lower body assist Assist for lower body dressing: Moderate Assistance - Patient 50 - 74%     Toileting Toileting    Toileting assist Assist for toileting: Moderate Assistance - Patient 50 - 74%     Transfers Chair/bed transfer  Transfers assist     Chair/bed transfer assist level: Minimal Assistance - Patient > 75%     Locomotion Ambulation   Ambulation assist      Assist level: Moderate Assistance - Patient 50 - 74% Assistive device: Hand held assist Max distance: 30   Walk 10 feet activity   Assist     Assist level: Moderate Assistance - Patient - 50 - 74% Assistive device: Hand held assist   Walk 50 feet activity   Assist Walk 50 feet with 2 turns activity did not occur: Safety/medical concerns (L hemiplegia and pt fatigue)  Assist level: 2 helpers      Walk 150 feet  activity   Assist Walk 150 feet activity did not occur: Safety/medical concerns (L hemiplegia and pt fatigue)         Walk 10 feet on uneven surface  activity   Assist Walk 10 feet on uneven surfaces activity did not occur: Safety/medical concerns (L hemiplegia and pt fatigue)         Wheelchair     Assist Is the patient using a wheelchair?: Yes Type of Wheelchair: Manual    Wheelchair assist level: Supervision/Verbal cueing Max wheelchair distance: 150    Wheelchair 50 feet with 2 turns activity    Assist        Assist Level: Supervision/Verbal cueing   Wheelchair 150 feet activity     Assist      Assist Level: Minimal Assistance - Patient > 75%   Blood pressure 105/67, pulse 69, temperature 98.5 F (36.9 C), temperature source Oral, resp. rate 17, height 5\' 6"  (1.676 m), weight 64.4 kg, SpO2 97%.   Medical Problem List and Plan: 1. 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 Search for facility ongoing. May only be able to go to custodial care facility  Con't CIR PT and OT and SLP  Doing P2P with insurance due to them denying ongoing car,e in spite of pt min-mod A with ADLs- as well as max verbal cues.  2. DVT/anticoagulation:  Pharmaceutical: Lovenox              -antiplatelet therapy: Aspirin  81 mg daily and Plavix  75 mg daily x 3 weeks then Plavix  alone 3. Pain Management: Tylenol  as needed  3/25- denies pain- con't regimen rpn   3-30: Ongoing left hip pain; Voltaren  gel and Tylenol .  3/31- will start Vit D and Ca per pt request- has helped hip pain in past?  4/1- pt insistent was very helpful already for hip pain  4/2- pt wants at the same time- per chart, Ca and Vit D come at  same time  4/3- having new burning pain in LUE- which is seen sometimes with stroke- will add Duloxetine  30 mg daily for nerve pain- monitor for side effects.  -  if gets nausea, will change to Lyrica 4/4-6 no side effects so far-  4/8- will increase Duloxetine  to 60 mg daily  4/21 back feels MUCH better with new mattress. 4/22- Pt reports 10/10 L wrist pain  -will get Xray-  4. Depression/Behavior/Sleep: Provide emotional support  4/21 mood improved--continue duloxetine              -antipsychotic agents: N/A 5. Neuropsych/cognition: This patient is capable of making decisions on her own behalf. 6. Skin/Wound Care: Routine skin checks 7. Fluids/Electrolytes/Nutrition:encouraging po.   4/21- I personally reviewed the patient's labs today.     -albumin still a bit low--encourage po/proteins. We have discussed improving intake 8.  Dysphagia.  Dysphagia 3 with thins currently  4/18 intake ok    9.  Hyperlipidemia.  Crestor  10.  Recent History of cocaine/tobacco use.  UDS positive for cocaine.  Provide counseling, this has been a chronic issue , seen in PCP notes 11.  Hypothyroidism. continue Synthroid   12.  Constipation.   Senokot-S 2 tab twice daily   LBM 4/15   4/20- LBM this AM  4/22- LBM 2 days ago 15. Blurry vision  3/27- will need Ophtho after d/c.   3-29: Seems to mostly be from the left eye, with difficulty converging and tracking; messaged OT and PT to trial taping versus alternating eye patch to help with convergence.  3-30: Reporting improvement with use of taping  4/4- pt reports everything blurry- cannot see faces as a result- never had eye checked in past- used readers but cannot see at a distance since stroke- will send to Ophtho after d/c.   4/7- d/w pt again- she wants eyeglasses- explained we cannot get when in hospital- also per guidelines, since vision can still change, to not get for 3 months after stroke. She disbelieves it's from stroke, since had 2 prior- that didn't  cause vision changes- we educated pt pn how this can occur.   16.  Left hip pain.  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 Severe HA-resolved  4/15-16 sx appear improved with topamax , tylenol  helps also   -may use sx to get out of therapy at times?  18. Decreased safety awareness/impulsiveness  4/22- still requiring max cues 20. Spasticity  4/18 tone appears controlled. Off baclofen , cont PT, OT  4/22- off baclofen , and c/o LUE pain- wondering how much of her complaints are due to tone?-  21. 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 .    22.  Urinary incont due to CVA, cont toileting program      I spent a total of  59  minutes on total care today- >50% coordination of care- due to  [employer] for peer to peer; also xray of L wrist- and team conference- also spoke with SW about plan for SNF and added voltaren  gel for L wrist.   LOS: 29 days A FACE TO FACE EVALUATION WAS PERFORMED  Brit Wernette 03/09/2024, 9:23 AM

## 2024-03-09 NOTE — Progress Notes (Signed)
 Speech Language Pathology Weekly Progress and Session Note  Patient Details  Name: Theresa Watkins MRN: [id number] Date of Birth: Apr 05, 1966  Beginning of progress report period: March 02, 2024 End of progress report period: March 09, 2024  Today's Date: 03/09/2024 SLP Individual Time: 4098-1191 SLP Individual Time Calculation (min): 62 min  Short Term Goals: Week 4: SLP Short Term Goal 1 (Week 4): Patient will consume least restrictive diet with use of compensatory strategies given supervision multimodal A SLP Short Term Goal 1 - Progress (Week 4): Met SLP Short Term Goal 2 (Week 4): Patient will increase speech intelligibility to 90% at the sentence level given min multimodal A SLP Short Term Goal 2 - Progress (Week 4): Progressing toward goal SLP Short Term Goal 5 (Week 4): Patient will increase speech intelligibility to 90% at the sentence level to familiar and unfamiliar listeners given min multimodal cues    New Short Term Goals: Week 5: SLP Short Term Goal 1 (Week 5): Patient will increase speech intelligibility to 90% at the sentence level given min multimodal A  Weekly Progress Updates: Patient made steady gains meeting 1 of 2 STGs this reporting period.Pt requires supervision level of assist during meals for use of safe swallowing strategies. Speech intelligibility fluctuates between 70-90% to familiar and unfamiliar listeners. Pt/ family education ongoing. Pt would benefit from continued skilled SLP intervention to maximize swallowing and speech in order to maximize her functional independence prior to discharge.   Intensity: Minumum of 1-2 x/day, 30 to 90 minutes Frequency: 1 to 3 out of 7 days;3 to 5 out of 7 days Duration/Length of Stay: 4/25 Treatment/Interventions: Multimodal communication approach;Speech/Language facilitation;Functional tasks;Therapeutic Activities;Therapeutic Exercise;Internal/external aids;Dysphagia/aspiration precaution training;Patient/family  education   Daily Session  Skilled Therapeutic Interventions: Patient was seen in PM to address dysphagia management and speech intelligibility. Pt was alert and seated upright in bed upon SLP arrival. She was agreeable for session. Pt completed 5 sets of 5 on EMST set at 30 cm H2O and min A cues. She completed 10  posterior reps on IOPI set at 15 kPA and 10 reps anteriorly at [facility] with min A. Pt admitted to poor completion of EMST outside of sessions. SLP re-educated pt on rationale for use with pt verbalizing understanding. In other minutes of session, SLP reviewed diet compensatory strategies as well as current recommendations as pt reported some frustration with full supervision. Pt verbalized understanding after education. SLP challenged pt's speech through functional tasks. Given a visual barrier pt communicated preferred food items and then her medical hx with pt sustaining 90% intelligibility to familiar and an unfamiliar listener during task. Pt's speech noted to decline in informal scenarios with unfamiliar listener reporting ~70% intelligibility. SLP reviewed speech intelligibility strategies as well as energy conservation methods. Pt left in bed with call button within reach and bed alarm active. SLP to continue POC.   General    Pain Pain Assessment Pain Scale: 0-10 Pain Score: 0-No pain  Therapy/Group: Individual Therapy  Adela Holter 03/09/2024, 3:51 PM

## 2024-03-09 NOTE — Progress Notes (Signed)
 Occupational Therapy Session Note  Patient Details  Name: Theresa Watkins MRN: [id number] Date of Birth: [birth year]  Today's Date: 03/09/2024 OT Individual Time: 0930-1040 OT Individual Time Calculation (min): 70 min    Short Term Goals: Week 5:  OT Short Term Goal 1 (Week 5): Pt will complete LB dressing tasks with mod A OT Short Term Goal 2 (Week 5): Pt will perform UB dressing tasks with min A OT Short Term Goal 3 (Week 5): Pt will perfrom toileting tasks with min A  Skilled Therapeutic Interventions/Progress Updates:    Pt resting in bed upon arrival and agreeable to therapy. Tot A for donning pants and socks. Supine>sit EOB with supervision. OT intervention with focus on LUE NMR and LUE retrograde massage. Stand pivot transfer with min A and max verbal cues for sequencing and safety. LUE focus on shoulder flexion/extension and elbow flexion/extension. Unable to complete WBing 2/2 Lt wrist pain. Pt not using LUE as stablizer or gross assist. Pt returned to room and performe squat pivot transer to EOB. Sit>supine with min A and max verbal cues for safety. Pt remained in bed with all needs within reach. Bed alarm ativated.   Therapy Documentation Precautions:  Precautions Precautions: Fall Precaution/Restrictions Comments: L hemi Restrictions Weight Bearing Restrictions Per Provider Order: No    Pain:  Pt c/o LT wrist pain; MD aware and xray ordered   Therapy/Group: Individual Therapy  [name] 03/09/2024, 12:07 PM

## 2024-03-09 NOTE — Progress Notes (Signed)
 Physical Therapy Session Note  Patient Details  Name: Theresa Watkins MRN: [id number] Date of Birth: 12-Oct-1966  Today's Date: 03/09/2024 PT Missed Time: 30 Minutes Missed Time Reason: Patient unwilling to participate;Unavailable (Comment) (pt refused d/t just starting breakfast)  Short Term Goals: Week 4:  PT Short Term Goal 1 (Week 4): =LTGs d/t ELOS  Skilled Therapeutic Interventions/Progress Updates:    Pt seen at scheduled time but declined to participate at this time d/t just starting her breakfast. Attempted to see later in the day, but pt was unavailable d/t other scheduled therapies. Missed 30 min of scheduled therapy, will attempt to make up as schedule allows.   Therapy Documentation Precautions:  Precautions Precautions: Fall Precaution/Restrictions Comments: L hemi Restrictions Weight Bearing Restrictions Per Provider Order: No General: PT Amount of Missed Time (min): 30 Minutes PT Missed Treatment Reason: Patient unwilling to participate;Unavailable (Comment) (pt refused d/t just starting breakfast)     Therapy/Group: Individual Therapy  Tex Filbert 03/09/2024, 3:05 PM

## 2024-03-09 NOTE — Progress Notes (Signed)
 Occupational Therapy Weekly Progress Note  Patient Details  Name: Theresa Watkins MRN: [id number] Date of Birth: 01-Nov-1966  Beginning of progress report period: March 03, 2024 End of progress report period: March 09, 2024   Patient has met 2 of 3 short term goals.  Pt progress has been slow and inconsistent during the past week. Bathing at min A at shower level. UB dressing with mod A and LB dressing with max A. Functional transfers with min A. Pt continues to require max verbal cues for safety awareness and consistently exhibits impulsive behaviors during transitional movements. Pt requires max verbal cues for LUE awareness and to incorporate into functional tasks.  Patient continues to demonstrate the following deficits: muscle weakness, decreased cardiorespiratoy endurance, impaired timing and sequencing, unbalanced muscle activation, motor apraxia, decreased coordination, and decreased motor planning, decreased motor planning, decreased attention, decreased awareness, decreased problem solving, decreased safety awareness, and decreased memory, and decreased sitting balance, decreased standing balance, decreased postural control, hemiplegia, and decreased balance strategies and therefore will continue to benefit from skilled OT intervention to enhance overall performance with BADL and Reduce care partner burden.  Patient progressing toward long term goals..  Continue plan of care.  OT Short Term Goals Week 4:  OT Short Term Goal 1 (Week 4): Pt will complete LB dressing tasks with mod A OT Short Term Goal 1 - Progress (Week 4): Progressing toward goal OT Short Term Goal 2 (Week 4): Pt will complete UB dressing tasks with mod A OT Short Term Goal 2 - Progress (Week 4): Met OT Short Term Goal 3 (Week 4): Pt will complete toileting tasks with mod A OT Short Term Goal 3 - Progress (Week 4): Met Week 5:  OT Short Term Goal 1 (Week 5): Pt will complete LB dressing tasks with mod A OT Short Term  Goal 2 (Week 5): Pt will perform UB dressing tasks with min A OT Short Term Goal 3 (Week 5): Pt will perfrom toileting tasks with min A   [name] 03/09/2024, 6:58 AM

## 2024-03-10 DIAGNOSIS — I63511 Cerebral infarction due to unspecified occlusion or stenosis of right middle cerebral artery: Secondary | ICD-10-CM | POA: Diagnosis not present

## 2024-03-10 NOTE — Progress Notes (Signed)
 Recreational Therapy Session Note  Patient Details  Name: Theresa Watkins MRN: [id number] Date of Birth: [birth year] Today's Date: 03/10/2024  Pain:no c/o  Pt participated in animal assisted activity seated w/c level with supervision.  Pt was excited to see the pet partner team again and anxious to interact and pet Dixie.  Pt smiling throughout the visit expression appreciation for another pet partner visit.  Lynkin Saini 03/10/2024, 3:16 PM

## 2024-03-10 NOTE — Progress Notes (Signed)
 PROGRESS NOTE   Subjective/Complaints:  Pt reports LBM yesterday Denies constipation.  Asking for ACE wrap of L wrist- I will try ordered splint as well.  She can have L wrist/forearm ACE wrapped.     ROS:    Pt denies SOB, abd pain, CP, N/V/C/D, and vision changes    (+) L wrist pain   Objective:   DG Wrist Complete Left Result Date: 03/09/2024 CLINICAL DATA:  Left wrist pain. EXAM: LEFT WRIST - COMPLETE 3+ VIEW COMPARISON:  None Available. FINDINGS: There is no evidence of acute fracture or dislocation. The carpal rows are intact and demonstrate normal alignment. Severe first CMC joint space narrowing with marginal osteophytosis. Mild-to-moderate degenerative changes of the triscaphe articulation. No significant soft tissue abnormalities are seen. IMPRESSION: 1. No acute osseous abnormality. 2. Severe degenerative changes of the first [facility] joint. Mild-to-moderate degenerative changes of the triscaphe articulation. Electronically Signed   By: Mannie Seek M.D.   On: 03/09/2024 15:51     Recent Labs    03/08/24 0536  WBC 4.7  HGB 14.5  HCT 44.0  PLT 161      Recent Labs    03/08/24 0536  NA 138  K 3.9  CL 108  CO2 21*  GLUCOSE 87  BUN 16  CREATININE 0.80  CALCIUM  8.5*       Intake/Output Summary (Last 24 hours) at 03/10/2024 0819 Last data filed at 03/09/2024 1800 Gross per 24 hour  Intake 480 ml  Output --  Net 480 ml         Physical Exam: Vital Signs Blood pressure (P) 113/73, pulse (P) 68, temperature (!) (P) 97.5 F (36.4 C), temperature source (P) Oral, resp. rate (P) 16, height 5\' 6"  (1.676 m), weight 64.4 kg, SpO2 97%.      General: awake, alert, appropriate, just woke up- supine in bed; scrunched down in bed; NAD HENT: conjugate gaze; oropharynx moist CV: regular rate and rhythm; no JVD Pulmonary: CTA B/L; no W/R/R- good air movement GI: soft, NT, ND, (+)BS Psychiatric:  appropriate- but sleepy- just woke up- nursing in room;  Neurological: moderate dysarthria- harder to understand when just wakes up  Neurological: fairly alert. Speech remains quite dysarthric.   MAS of 1  in L shoulder and 1 in L elbow- none in L wrist or fingers so far- Hoffman's (+) in LUE- no change today since meds have worn off overnight  Mild to mod dysarthria.  Left central 7  Strength exam- LUE 2- to 2/5  LLE 3 to 3+/5.  -sensory exam normal. Prior neuro assessment is c/w today's exam 03/10/2024.  Skin- L PIP of 2nd digit- has very large scab vs plaque    Musculoskeletal:  Left hip pain with IR/ER. Moved LLE easily in bed (flex/ext) without discomfort.  L wrist TTP across dorsum of L wrist- and ulnar styloid- pain with ROM of L wrist- in all directions. No heat, but questionable mild erythema.    Assessment/Plan: 1. Functional deficits which require 3+ hours per day of interdisciplinary therapy in a comprehensive inpatient rehab setting. Physiatrist is providing close team supervision and 24 hour management of active medical problems listed below. Physiatrist  and rehab team continue to assess barriers to discharge/monitor patient progress toward functional and medical goals  Care Tool:  Bathing    Body parts bathed by patient: Face, Left arm, Chest, Abdomen, Front perineal area, Buttocks, Right upper leg, Left upper leg, Right lower leg, Left lower leg   Body parts bathed by helper: Right arm     Bathing assist Assist Level: Minimal Assistance - Patient > 75%     Upper Body Dressing/Undressing Upper body dressing   What is the patient wearing?: Pull over shirt    Upper body assist Assist Level: Moderate Assistance - Patient 50 - 74%    Lower Body Dressing/Undressing Lower body dressing      What is the patient wearing?: Pants, Underwear/pull up     Lower body assist Assist for lower body dressing: Moderate Assistance - Patient 50 - 74%      Toileting Toileting    Toileting assist Assist for toileting: Moderate Assistance - Patient 50 - 74%     Transfers Chair/bed transfer  Transfers assist     Chair/bed transfer assist level: Minimal Assistance - Patient > 75%     Locomotion Ambulation   Ambulation assist      Assist level: Moderate Assistance - Patient 50 - 74% Assistive device: Hand held assist Max distance: 30   Walk 10 feet activity   Assist     Assist level: Moderate Assistance - Patient - 50 - 74% Assistive device: Hand held assist   Walk 50 feet activity   Assist Walk 50 feet with 2 turns activity did not occur: Safety/medical concerns (L hemiplegia and pt fatigue)  Assist level: 2 helpers      Walk 150 feet activity   Assist Walk 150 feet activity did not occur: Safety/medical concerns (L hemiplegia and pt fatigue)         Walk 10 feet on uneven surface  activity   Assist Walk 10 feet on uneven surfaces activity did not occur: Safety/medical concerns (L hemiplegia and pt fatigue)         Wheelchair     Assist Is the patient using a wheelchair?: Yes Type of Wheelchair: Manual    Wheelchair assist level: Supervision/Verbal cueing Max wheelchair distance: 150    Wheelchair 50 feet with 2 turns activity    Assist        Assist Level: Supervision/Verbal cueing   Wheelchair 150 feet activity     Assist      Assist Level: Minimal Assistance - Patient > 75%   Blood pressure (P) 113/73, pulse (P) 68, temperature (!) (P) 97.5 F (36.4 C), temperature source (P) Oral, resp. rate (P) 16, height 5\' 6"  (1.676 m), weight 64.4 kg, SpO2 97%.   Medical Problem List and Plan: 1. 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 Search for facility ongoing. May only be able to go to custodial care facility  D/c SNF- hopefully by end of week/early next week  Have P2P planned for 2:30 pm  Con't CIR PT, OT and SLP  Doing P2P with insurance due to them denying ongoing car,e in spite of pt min-mod A with ADLs- as well  as max verbal cues.  2. DVT/anticoagulation:  Pharmaceutical: Lovenox              -antiplatelet therapy: Aspirin  81 mg daily and Plavix  75 mg daily x 3 weeks then Plavix  alone 3. Pain Management: Tylenol  as needed  3/25- denies pain- con't regimen rpn   3-30: Ongoing left hip pain; Voltaren  gel and Tylenol .  3/31- will start Vit D and Ca per pt request- has helped hip pain in past?  4/1- pt insistent was very helpful already for hip pain  4/2- pt wants at the same time- per chart, Ca and Vit D come at same time  4/3- having new burning pain in LUE- which is seen sometimes with stroke- will add Duloxetine  30 mg daily for nerve pain- monitor for side effects.  -if gets nausea, will change to Lyrica 4/4-6 no side effects so far-  4/8- will increase Duloxetine  to 60 mg daily  4/21 back feels MUCH better with new mattress. 4/22- Pt reports 10/10 L wrist pain  -will get Xray-  4/23- Has severe L CMC arthritis- will order L wrist splint with coverage for [facility] joint if possible- can be ACE wrapped if needed- let therapy know 4. Depression/Behavior/Sleep: Provide emotional support  4/21 mood improved--continue duloxetine              -antipsychotic agents: N/A 5. Neuropsych/cognition: This patient is capable of making decisions on her own behalf. 6. Skin/Wound Care: Routine skin checks 7. Fluids/Electrolytes/Nutrition:encouraging po.   4/21- I personally reviewed the patient's labs today.     -albumin still a bit low--encourage po/proteins. We have discussed improving intake 8.  Dysphagia.  Dysphagia 3 with thins currently  4/18 intake ok    9.  Hyperlipidemia.  Crestor  10.   Recent History of cocaine/tobacco use.  UDS positive for cocaine.  Provide counseling, this has been a chronic issue , seen in PCP notes 11.  Hypothyroidism. continue Synthroid   12.  Constipation.   Senokot-S 2 tab twice daily   LBM 4/15   4/20- LBM this AM  4/22- LBM 2 days ago  4/23- LBM yesterday 15. Blurry vision  3/27- will need Ophtho after d/c.   3-29: Seems to mostly be from the left eye, with difficulty converging and tracking; messaged OT and PT to trial taping versus alternating eye patch to help with convergence.  3-30: Reporting improvement with use of taping  4/4- pt reports everything blurry- cannot see faces as a result- never had eye checked in past- used readers but cannot see at a distance since stroke- will send to Ophtho after d/c.   4/7- d/w pt again- she wants eyeglasses- explained we cannot get when in hospital- also per guidelines, since vision can still change, to not get for 3 months after stroke. She disbelieves it's from stroke, since had 2 prior- that didn't cause vision changes- we educated pt pn how this can occur.   16.  Left hip pain.  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 Severe HA-resolved  4/15-16 sx appear improved with topamax , tylenol  helps also   -may use sx to get out of therapy at times?  4/23- Per team, pt having HA's frequently which impairs therapy- she won't discuss HA's with me- unclear if  only has a day goes on vs self limting behavior 18. Decreased safety awareness/impulsiveness  4/22- still requiring max cues 20. Spasticity  4/18 tone appears controlled. Off baclofen , cont PT, OT  4/22- off baclofen , and c/o LUE pain- wondering how much of her complaints are due to tone?-  21. L 2nd digit PIP lesion-   4/10- needs outpt  f/u with Derm- concern of basal cell > squamous cell, but does appear slightly shiny- carcinoma based on appearance. .    22.  Urinary  and bowel incont due to CVA, cont toileting program 4/23- still cannot control B/B   23. 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   LOS: 30 days A FACE TO FACE EVALUATION WAS PERFORMED  Theresa Watkins 03/10/2024, 8:19 AM

## 2024-03-10 NOTE — Progress Notes (Signed)
 Occupational Therapy Session Note  Patient Details  Name: Theresa Watkins MRN: [id number] Date of Birth: [birth year]  Today's Date: 03/10/2024 OT Individual Time: 1420-1500 OT Individual Time Calculation (min): 40 min    Short Term Goals: Week 5:  OT Short Term Goal 1 (Week 5): Pt will complete LB dressing tasks with mod A OT Short Term Goal 2 (Week 5): Pt will perform UB dressing tasks with min A OT Short Term Goal 3 (Week 5): Pt will perfrom toileting tasks with min A  Skilled Therapeutic Interventions/Progress Updates:  Pt received resting in bed, un-rated pain in L-wrist, cock-up splint donned/re-adjusted as appropriate. Pt comes to EOB with Min-light Mod A, using looped gait belt at EOB to pull self up. Sit<>stand from EOB with Min A, max A hiking joggers. Stand-pivot from EOB<>WC with Min A and unilateral support. Pt dependent for transport from room<>day room for time management. In day room, pt performs x5 sit<>stands with Min A, utilizing table-edge at BUE upon reaching stance, CGA-light Min A for static stance. Attepmted to integrate gross use of LUE for table-top task, but patient unable to tolerate. Pt with some reports of lightheadedness, VSS. Pt remained resting in bed, bed alarm activated, and all immediate needs met.   Therapy Documentation Precautions:  Precautions Precautions: Fall Precaution/Restrictions Comments: L hemi Restrictions Weight Bearing Restrictions Per Provider Order: No   Therapy/Group: Individual Therapy  Artemus Biles, OTR/L, MSOT  03/10/2024, 6:29 AM

## 2024-03-10 NOTE — Progress Notes (Signed)
 Orthopedic Tech Progress Note Patient Details:  Theresa Watkins 09-22-1966 [id number]  Ortho Devices Type of Ortho Device: Wrist splint Ortho Device/Splint Location: LUE Ortho Device/Splint Interventions: Ordered, Application, Adjustment   Post Interventions Patient Tolerated: Well Instructions Provided: Care of device  Kermitt Pedlar 03/10/2024, 9:28 AM

## 2024-03-10 NOTE — Plan of Care (Signed)
 Goals downgraded due to progress and still needing A Problem: RH Balance Goal: LTG Patient will maintain dynamic standing with ADLs (OT) Description: LTG:  Patient will maintain dynamic standing balance with assist during activities of daily living (OT)  Flowsheets (Taken 03/10/2024 0816) LTG: Pt will maintain dynamic standing balance during ADLs with: (downgraded JLS) Minimal Assistance - Patient > 75%   Problem: RH Dressing Goal: LTG Patient will perform lower body dressing w/assist (OT) Description: LTG: Patient will perform lower body dressing with assist, with/without cues in positioning using equipment (OT) Flowsheets (Taken 03/10/2024 0816) LTG: Pt will perform lower body dressing with assistance level of: (downgradedJLS) Moderate Assistance - Patient 50 - 74%   Problem: RH Awareness Goal: LTG: Patient will demonstrate awareness during functional activites type of (OT) Description: LTG: Patient will demonstrate awareness during functional activites type of (OT) Flowsheets (Taken 03/10/2024 [phone]) Patient will demonstrate awareness during functional activites type of: Anticipatory LTG: Patient will demonstrate awareness during functional activites type of (OT): (downgraded JLS) Moderate Assistance - Patient 50 - 74%

## 2024-03-10 NOTE — Progress Notes (Signed)
 Speech Language Pathology Daily Session Note  Patient Details  Name: RUHAMA LEHEW MRN: [id number] Date of Birth: 04-Sep-1966  Today's Date: 03/10/2024 SLP Individual Time: 2841-3244 SLP Individual Time Calculation (min): 48 min  Short Term Goals: Week 5: SLP Short Term Goal 1 (Week 5): Patient will increase speech intelligibility to 90% at the sentence level given min multimodal A  Skilled Therapeutic Interventions:   Patient was seen in am to address speech intelligibility. Pt was alert and seen at bedside. SLP assisted in repositioning pt upright with HOB support. SLP guided pt in completion of 5 sets of 5 via EMST. SLP decreased intensity as pt demonstrated greater difficulty achieving accuracy this date. She reported feeling "weak." Intensity decreased to 20cm H2O where pt warranted sup A for completion. SLP encouraged pt to complete an additional set this PM with pt verbalizing understanding. Pt also completed 10 reps of posterior lingual strength training at 15kPA and 10 reps at 20 kPA anteriorly. Pt continued to report intensity level as "very hard." In other minutes of session, SLP addressed speech intelligibility through functional communication. Pt communicating name, DOB, address, and emergency contact info with 90% intelligibility and min A. At conclusion of session pt was left in bed with call button within reach and bed alarm active. SLP to continue POC.   Pain Pain Assessment Pain Scale: 0-10 Pain Score: 0-No pain  Therapy/Group: Individual Therapy  Adela Holter 03/10/2024, 11:46 AM

## 2024-03-10 NOTE — Patient Care Conference (Signed)
 Inpatient RehabilitationTeam Conference and Plan of Care Update Date: 03/09/2024   Time: 1103 am     Patient Name: Theresa Watkins      Medical Record Number: [id number]  Date of Birth: 1966/10/03 Sex: Female         Room/Bed: 4W01C/4W01C-01 Payor Info: Payor: [place] MEDICAID PREPAID HEALTH PLAN / Plan: [place] MEDICAID HEALTHY BLUE / Product Type: *No Product type* /    Admit Date/Time:  02/09/2024  6:29 PM  Primary Diagnosis:  Right middle cerebral artery stroke [facility])  Hospital Problems: Principal Problem:   Right middle cerebral artery stroke [facility]) Active Problems:   Polysubstance abuse [facility])    Expected Discharge Date: Expected Discharge Date: 03/12/24  Team Members Present: Physician leading conference: Dr. Celia Coles Social Worker Present: Norval Been, LCSWA Nurse Present: Jerene Monks, RN PT Present: Aundria Leech, PT OT Present: [name], OT SLP Present: Jenney Modest, SLP     Current Status/Progress Goal Weekly Team Focus  Bowel/Bladder   Patient is incontinent x2. Last BM 3/20.   Patient to be able to call for toileting needs to train for continence.   Toilet patient q3-4 hours while awake and q6 at HS.    Swallow/Nutrition/ Hydration   dys 3 and thin   min A  carryover of compensatory strategies, IOPI and EMST    ADL's   bathing-min A; LB dressing-max A; UB dressing-min A; squat pivot tranfsers min A; max verbal cues for safety awareness   min A overall; LB dressing downgraded to mod A; dynamic standing balance downgraded to min A; anticipatory awareness downgraded to mod A   BADLs, LUE NMR, safety awareness, educaiton    Mobility   supervision bed mobiltiy, min a- CGA transfers d/t impulsivity, limited gait training d/t limited participation  but up to 40 ft with rail   Supervision to min a overall  gait, transfers, participation    Communication   mixed dysarthria ranging from 80-90% intelligibility   min A   carryover of strategies     Safety/Cognition/ Behavioral Observations               Pain   Patient c/o headache. Currently on topamax  scheduled which gives her relief.   Pain less than or equal to 2.   Assess pain q shift and prn.    Skin   Skin is intact. Thickened skin/scab? on left findex finger knuckle.   No skin breakdown.  Assess skin q shift and prn.      Discharge Planning:  D/c pending family decision. Home vs SNF. Limited SNF options due to insurnace. SW provided sitter list. SW will confirm there are no barriers to discharge.   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 Patient limited with left wrist pain nerve vs spasticity, impulsive, needing max cues with gait training, nausea, headaches , dysarthria , poor carry over , poor safety awareness and mixed dysarthria.   Patient on target to meet rehab goals: yes, Patient requires min assist with upper body dressing, max assist with lower body dressing and min assist with  squat pivot transfers with  max verbal cues for safety awareness. Patient had  limited gait training due to limited participation  but able to ambulate up 40 ft with rail. Overall goals are set for supervsiion -min A at discharge.   *See Care Plan and progress notes for long and short-term goals.   Revisions to Treatment Plan:  Goals downgraded IOPI NMES Carry over strategies  Teaching Needs: Safety, medications, transfers, toileting, etc.   Current Barriers to Discharge: Decreased caregiver support, Incontinence, and Behavior  Possible Resolutions to Barriers: Family Education SNF      Medical Summary Current Status: incontinent B/B- due to CVA- is on timed toileting- LUE pain- HA as well-  Barriers to Discharge: Behavior/Mood;Incontinence;Neurogenic Bowel & Bladder;Medical stability;Spasticity;Self-care education  Barriers to Discharge Comments: impulsive, decreased safety  awareness- self limiting- "HA or nauseated"- downgrade supervison goals- max cues required; LUE pain- and spasticity LUE>LLE, dysphagia Possible Resolutions to Barriers/Weekly Focus: L wrist xray- spasticity management-  plans for SNF- was supposed to be discharged by this Friday.   Continued Need for Acute Rehabilitation Level of Care: The patient requires daily medical management by a physician with specialized training in physical medicine and rehabilitation for the following reasons: Direction of a multidisciplinary physical rehabilitation program to maximize functional independence : Yes Medical management of patient stability for increased activity during participation in an intensive rehabilitation regime.: Yes Analysis of laboratory values and/or radiology reports with any subsequent need for medication adjustment and/or medical intervention. : Yes   I attest that I was present, lead the team conference, and concur with the assessment and plan of the team.   Jerene Monks 03/09/2024, 1103 am

## 2024-03-10 NOTE — Progress Notes (Signed)
 Physical Therapy Session Note  Patient Details  Name: Theresa Watkins MRN: [id number] Date of Birth: 1966-05-03  Today's Date: 03/10/2024 PT Individual Time: 1100-1157 PT Individual Time Calculation (min): 57 min   Short Term Goals: Week 4:  PT Short Term Goal 1 (Week 4): =LTGs d/t ELOS  Skilled Therapeutic Interventions/Progress Updates:      Pt supine in bed upon arrival. Pt agreeable to therapy. Pt denies any pain.   Pt reports urinary incontinence. Pt very adamant about changing it in bed versus on BSC. Pt performed rolling to L with use of bed rail and supervision and doffed brief with total A. Pt donned new brief with min A for feeding B LE through.   Pt performed supine to sit with use of bed rails and use of gait belt wrapped around foot board and CGA/min A, verbal cues provided for technique without use of gait belt for improved core and R UE activation.   Pt performed sit to stand with L HHA and mod A, and donned brief over buttocks with mod A with use of R UE and L HHA.   Pt performed stand pivot transfer bed to [facility] with mod A.   Pt performed mass practice of stand pivot transfer WC to mat table with mod A progressing to min A, verabl and tactile cues provided for technique and UE/LE positioning.   Pt performed stand pivot transfer to car simulator with mod A for transfer, pt able to lift B LE into car with UE support,  verbal and tactile uces provided for technique.   Pt performed stand pivot transfer WC to hospital bed with R UE support on bed rail and min A.   Pt performed sit to supine with use of R UE support on gait belt (hooked onto foot of bed). Pt then very low in bed. Pt scooted up to [facility] with use of R UE support on bed rail, and therapist providing approximation to L LE in hookyling position and cues provided for bridge technique.   Pt supine in bed at end of session with all needs within reach and bed alarm on.      Therapy Documentation Precautions:   Precautions Precautions: Fall Precaution/Restrictions Comments: L hemi Restrictions Weight Bearing Restrictions Per Provider Order: No   Therapy/Group: Individual Therapy  [facility] [place], [place], DPT  03/10/2024, 4:09 PM

## 2024-03-10 NOTE — Progress Notes (Addendum)
 Patient ID: Theresa Watkins, female   DOB: 10/05/1966, 58 y.o.   MRN: [id number]  SW met with pt in room to discuss SNF placement and completing LTC Medicaid application. She reports family discussed with her yesterday. SW also explained current insurance denial of current CIR and waiting on updates from P2P. Pt understands. Pt intends to keep he recurrent apartment as her pan is t get rehab and return home. She is amenable to completing LTC Medicaid application again (first attempt unsuccessful as pt declined).   SW updated Antonio/Financial Counseling on change in mind and pt states she will complete LTC Medicaid app.   1257 SW left message for Effie/Admissions with [facility] to discuss bed availability and if they accept insurance. SW waiting on follow-up.   1301-SW left message for Joan/Admissions with [facility] to discuss referral and waiting on follow-up.  *Referral declined as no beds.   1307- SW left message for [name]/Admissions with [facility] and waiting on follow-up.   1426-SW returned phone call to pt son Vonii to discuss above. SW will follow-up once there are more updates.   Norval Been, MSW, LCSW Office: [phone] Cell: [phone] Fax: [phone]

## 2024-03-11 DIAGNOSIS — I63511 Cerebral infarction due to unspecified occlusion or stenosis of right middle cerebral artery: Secondary | ICD-10-CM | POA: Diagnosis not present

## 2024-03-11 MED ORDER — TOPIRAMATE 25 MG PO TABS
75.0000 mg | ORAL_TABLET | Freq: Every day | ORAL | Status: DC
  Administered 2024-03-11: 75 mg via ORAL
  Filled 2024-03-11 (×4): qty 3

## 2024-03-11 NOTE — Progress Notes (Signed)
 Physical Therapy Session Note  Patient Details  Name: Theresa Watkins MRN: [id number] Date of Birth: 1966/08/12  Today's Date: 03/11/2024 PT Individual Time: 4098-1191 PT Individual Time Calculation (min): 60 min   Short Term Goals: Week 3:  PT Short Term Goal 1 (Week 3): pt will initiate stair training PT Short Term Goal 1 - Progress (Week 3): Not met PT Short Term Goal 2 (Week 3): pt will ambulate 100 ft with assistance PT Short Term Goal 2 - Progress (Week 3): Not met PT Short Term Goal 3 (Week 3): Pt will be able to recall steps and direct transfer safely PT Short Term Goal 3 - Progress (Week 3): Not met  Skilled Therapeutic Interventions/Progress Updates:    pt received in bed and agreeable to therapy. Pt reports pain in L wrist greatly improved by brace, but continued to have unrated shoulder pain. Utilized sling during upright activity for pain management and provided extensive education on positioning to decrease shoulder pain, pt was receptive and reports understanding.   Squat pivot transfer to w/c using bed rail with mod a moving toward L side.   Propelled w/c with RLE/RUE up to 100 ft during session with cueing for technique. Transported remaining distance for time and energy conservation. Pt participated in gait training with hall rail, max of 40 ft forward and max of 15 ft backward. Cues and assist for weight shifting, scissoring, knee block, hip/knee extension.   Squat pivot using bed rail with CGA back to bed at end of session, was left with all needs in reach and alarm active.   Therapy Documentation Precautions:  Precautions Precautions: Fall Precaution/Restrictions Comments: L hemi Restrictions Weight Bearing Restrictions Per Provider Order: No General:       Therapy/Group: Individual Therapy  Tex Filbert 03/11/2024, 1:45 PM

## 2024-03-11 NOTE — Progress Notes (Signed)
 Patient ID: Theresa Watkins, female   DOB: 03/28/1966, 58 y.o.   MRN: [id number]  Per attending, P2P was unsuccessful.  SW spoke with pt son Theresa Watkins to provide number insurance in efforts to appeal. He will follow-up after completed.   *Family completed appeal, and SW faxed clinicals to [phone].  Norval Been, MSW, LCSW Office: [phone] Cell: [phone] Fax: [phone]

## 2024-03-11 NOTE — Progress Notes (Signed)
 Occupational Therapy Session Note  Patient Details  Name: Theresa Watkins MRN: [id number] Date of Birth: 1966-07-12  Today's Date: 03/11/2024 OT Individual Time: 4098-1191 OT Individual Time Calculation (min): 40 min    Short Term Goals: Week 1:  OT Short Term Goal 1 (Week 1): Pt will complete PROM with LUE without VC OT Short Term Goal 1 - Progress (Week 1): Progressing toward goal OT Short Term Goal 2 (Week 1): Pt will use LUE as stabilizer during ADL tasks with Mod A OT Short Term Goal 2 - Progress (Week 1): Met OT Short Term Goal 3 (Week 1): Pt will complete shower transfer at Mod A with LRAD OT Short Term Goal 3 - Progress (Week 1): Progressing toward goal OT Short Term Goal 4 (Week 1): Pt will compplete UB dressing with himi technique at Mod A OT Short Term Goal 4 - Progress (Week 1): Met Week 2:  OT Short Term Goal 1 (Week 2): Pt will complete PROM with LUE without VC OT Short Term Goal 1 - Progress (Week 2): Met OT Short Term Goal 2 (Week 2): Pt will complete shower transfer at Mod A with LRAD OT Short Term Goal 2 - Progress (Week 2): Met OT Short Term Goal 3 (Week 2): Pt will complete toilet transfers with mod A OT Short Term Goal 3 - Progress (Week 2): Met OT Short Term Goal 4 (Week 2): Pt will complete toileting tasks with mod A OT Short Term Goal 4 - Progress (Week 2): Progressing toward goal OT Short Term Goal 5 (Week 2): Pt will complete LB dressing tasks with mod A OT Short Term Goal 5 - Progress (Week 2): Progressing toward goal  Skilled Therapeutic Interventions/Progress Updates:    1:1 Pt received in the bed and at first was not going to get up due to fatigue but was able to be encouraged. Pt came to EOB with min A for bringing left hip around. Min A transfers bed to w/c, w/c to shower bench and then back to w/c with mod Cues for proper hand and feet placement each time. PT with discomfort in left shoulder and required A to lift to wash it and guarded it more today  than in the past due to discomfort. Pt did received pain meds prior to session. Pt able to dress UB with min A and lower body clothing with min A. Pt does benefit from cues for body positioning prior to sit to stands and standing balance for safety. Pt transferred back into bed to rest with min A and left resting in the bed.   Therapy Documentation Precautions:  Precautions Precautions: Fall Precaution/Restrictions Comments: L hemi Restrictions Weight Bearing Restrictions Per Provider Order: No General:   Vital Signs:   Pain: Pain Assessment Pain Scale: 0-10 Pain Score: 9  Pain Type: Acute pain;Neuropathic pain Pain Location: Shoulder Pain Orientation: Left Pain Radiating Towards: Down left arm Pain Descriptors / Indicators: Throbbing Pain Frequency: Intermittent Pain Onset: With Activity Pain Intervention(s): Medication (See eMAR)    Therapy/Group: Individual Therapy  Theresa Watkins 03/11/2024, 1:40 PM

## 2024-03-11 NOTE — Progress Notes (Signed)
 Speech Language Pathology Daily Session Note  Patient Details  Name: Theresa Watkins MRN: [id number] Date of Birth: [birth year]  Today's Date: 03/11/2024 SLP Individual Time: 5284-1324 SLP Individual Time Calculation (min): 38 min  Short Term Goals: Week 5: SLP Short Term Goal 1 (Week 5): Patient will increase speech intelligibility to 90% at the sentence level given min multimodal A  Skilled Therapeutic Interventions: SLP conducted skilled therapy session targeting communication goals. SLP facilitated use of speech intelligibility strategies at the structured conversation level. Patient achieved 90% intelligibility given min verbal cues across all tasks. Patient was left in room with call bell in reach and alarm set. SLP will continue to target goals per plan of care.        Pain None endorsed  Therapy/Group: Individual Therapy  Theresa Watkins, M.A., CCC-SLP  Theresa Watkins 03/11/2024, 2:48 PM

## 2024-03-11 NOTE — Plan of Care (Signed)
  Problem: Consults Goal: RH STROKE PATIENT EDUCATION Description: See Patient Education module for education specifics  Outcome: Progressing   Problem: RH BOWEL ELIMINATION Goal: RH STG MANAGE BOWEL WITH ASSISTANCE Description: STG Manage Bowel with toileting Assistance. Outcome: Progressing Goal: RH STG MANAGE BOWEL W/MEDICATION W/ASSISTANCE Description: STG Manage Bowel with Medication with mod I Assistance. Outcome: Progressing   Problem: RH SAFETY Goal: RH STG ADHERE TO SAFETY PRECAUTIONS W/ASSISTANCE/DEVICE Description: STG Adhere to Safety Precautions With cues Assistance/Device. Outcome: Progressing   Problem: RH PAIN MANAGEMENT Goal: RH STG PAIN MANAGED AT OR BELOW PT'S PAIN GOAL Description: Pain managed < 4 with prns Outcome: Progressing   Problem: RH KNOWLEDGE DEFICIT Goal: RH STG INCREASE KNOWLEDGE OF HYPERTENSION Description: Patient and mother will be able to manage HTN using educational resources for medications and dietary modification independently Outcome: Progressing Goal: RH STG INCREASE KNOWLEDGE OF DYSPHAGIA/FLUID INTAKE Description: Patient and mother will be able to manage dysphagia using educational resources for medications and dietary modification independently Outcome: Progressing Goal: RH STG INCREASE KNOWLEGDE OF HYPERLIPIDEMIA Description: Patient and mother will be able to manage HLD using educational resources for medications and dietary modification independently Outcome: Progressing

## 2024-03-11 NOTE — Progress Notes (Signed)
 PROGRESS NOTE   Subjective/Complaints:  Pt reports got L wrist splint- really thinks it's helpful for pain.   LBM 2 days ago, but trying to hold it so can go with nursing and not be incontinent this AM.    Can really sleep- slept great last night.   Has a list of things she wants to go over with me, but cannot think of it currently.    ROS:    Pt denies SOB, abd pain, CP, N/V/C/D, and vision changes    (+) L wrist pain- slightly to a little better with wrist splint.    Objective:   DG Wrist Complete Left Result Date: 03/09/2024 CLINICAL DATA:  Left wrist pain. EXAM: LEFT WRIST - COMPLETE 3+ VIEW COMPARISON:  None Available. FINDINGS: There is no evidence of acute fracture or dislocation. The carpal rows are intact and demonstrate normal alignment. Severe first CMC joint space narrowing with marginal osteophytosis. Mild-to-moderate degenerative changes of the triscaphe articulation. No significant soft tissue abnormalities are seen. IMPRESSION: 1. No acute osseous abnormality. 2. Severe degenerative changes of the first [facility] joint. Mild-to-moderate degenerative changes of the triscaphe articulation. Electronically Signed   By: Mannie Seek M.D.   On: 03/09/2024 15:51     No results for input(s): "WBC", "HGB", "HCT", "PLT" in the last 72 hours.     No results for input(s): "NA", "K", "CL", "CO2", "GLUCOSE", "BUN", "CREATININE", "CALCIUM " in the last 72 hours.      Intake/Output Summary (Last 24 hours) at 03/11/2024 0913 Last data filed at 03/11/2024 0800 Gross per 24 hour  Intake 716 ml  Output --  Net 716 ml         Physical Exam: Vital Signs Blood pressure 125/85, pulse 78, temperature 98 F (36.7 C), temperature source Oral, resp. rate 16, height 5\' 6"  (1.676 m), weight 64.4 kg, SpO2 96%.       General: awake, alert, appropriate, woke easily;  NAD HENT: conjugate gaze; oropharynx moist CV:  regular rate and rhythm; no JVD Pulmonary: CTA B/L; no W/R/R- good air movement GI: soft, NT, ND, (+)BS Psychiatric: appropriate- but sleepy- just woke up Neurological: impulsive- just tried scooting to get OOB- had to remind her to stay n bed- still quite dysarthric MSK_ L wrist still very TTP over dorsum of wrist and dorsum of hand looks mildly swollen- but brace has improved pain/TTP Neurological: fairly alert. Speech remains quite dysarthric.   MAS of 1  in L shoulder and 1 in L elbow- none in L wrist or fingers so far- Hoffman's (+) in LUE- no change today since meds have worn off overnight  Mild to mod dysarthria.  Left central 7  Strength exam- LUE 2- to 2/5  LLE 3 to 3+/5.  -sensory exam normal. Prior neuro assessment is c/w today's exam 03/11/2024.  Skin- L PIP of 2nd digit- has very large scab vs plaque    Musculoskeletal:  Left hip pain with IR/ER. Moved LLE easily in bed (flex/ext) without discomfort.  L wrist TTP across dorsum of L wrist- and ulnar styloid- pain with ROM of L wrist- in all directions. No heat, but questionable mild erythema.    Assessment/Plan:  1. Functional deficits which require 3+ hours per day of interdisciplinary therapy in a comprehensive inpatient rehab setting. Physiatrist is providing close team supervision and 24 hour management of active medical problems listed below. Physiatrist and rehab team continue to assess barriers to discharge/monitor patient progress toward functional and medical goals  Care Tool:  Bathing    Body parts bathed by patient: Face, Left arm, Chest, Abdomen, Front perineal area, Buttocks, Right upper leg, Left upper leg, Right lower leg, Left lower leg   Body parts bathed by helper: Right arm     Bathing assist Assist Level: Minimal Assistance - Patient > 75%     Upper Body Dressing/Undressing Upper body dressing   What is the patient wearing?: Pull over shirt    Upper body assist Assist Level: Moderate Assistance -  Patient 50 - 74%    Lower Body Dressing/Undressing Lower body dressing      What is the patient wearing?: Pants, Underwear/pull up     Lower body assist Assist for lower body dressing: Moderate Assistance - Patient 50 - 74%     Toileting Toileting    Toileting assist Assist for toileting: Moderate Assistance - Patient 50 - 74%     Transfers Chair/bed transfer  Transfers assist     Chair/bed transfer assist level: Minimal Assistance - Patient > 75%     Locomotion Ambulation   Ambulation assist      Assist level: Moderate Assistance - Patient 50 - 74% Assistive device: Hand held assist Max distance: 30   Walk 10 feet activity   Assist     Assist level: Moderate Assistance - Patient - 50 - 74% Assistive device: Hand held assist   Walk 50 feet activity   Assist Walk 50 feet with 2 turns activity did not occur: Safety/medical concerns (L hemiplegia and pt fatigue)  Assist level: 2 helpers      Walk 150 feet activity   Assist Walk 150 feet activity did not occur: Safety/medical concerns (L hemiplegia and pt fatigue)         Walk 10 feet on uneven surface  activity   Assist Walk 10 feet on uneven surfaces activity did not occur: Safety/medical concerns (L hemiplegia and pt fatigue)         Wheelchair     Assist Is the patient using a wheelchair?: Yes Type of Wheelchair: Manual    Wheelchair assist level: Supervision/Verbal cueing Max wheelchair distance: 150    Wheelchair 50 feet with 2 turns activity    Assist        Assist Level: Supervision/Verbal cueing   Wheelchair 150 feet activity     Assist      Assist Level: Minimal Assistance - Patient > 75%   Blood pressure 125/85, pulse 78, temperature 98 F (36.7 C), temperature source Oral, resp. rate 16, height 5\' 6"  (1.676 m), weight 64.4 kg, SpO2 96%.   Medical Problem List and Plan: 1. 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 Search for facility ongoing. May only be able to go to custodial care facility  D/c SNF- hopefully by end of week/early next week  Have P2P planned for 2:30  pm  Con't CIR PT, OT and SLP  P2P was denied- doing appeal 2. DVT/anticoagulation:  Pharmaceutical: Lovenox              -antiplatelet therapy: Aspirin  81 mg daily and Plavix  75 mg daily x 3 weeks then Plavix  alone 3. Pain Management: Tylenol  as needed  3/25- denies pain- con't regimen rpn   3-30: Ongoing left hip pain; Voltaren  gel and Tylenol .  3/31- will start Vit D and Ca per pt request- has helped hip pain in past?  4/1- pt insistent was very helpful already for hip pain  4/2- pt wants at the same time- per chart, Ca and Vit D come at same time  4/3- having new burning pain in LUE- which is seen sometimes with stroke- will add Duloxetine  30 mg daily for nerve pain- monitor for side effects.  -if gets nausea, will change to Lyrica 4/4-6 no side effects so far-  4/8- will increase Duloxetine  to 60 mg daily  4/21 back feels MUCH better with new mattress. 4/22- Pt reports 10/10 L wrist pain  -will get Xray-  4/23- Has severe L CMC arthritis- will order L wrist splint with coverage for [facility] joint if possible- can be ACE wrapped if needed- let therapy know 4/24- L wrist slightly to somewhat better with L wrist splint 4. Depression/Behavior/Sleep: Provide emotional support  4/21 mood improved--continue duloxetine              -antipsychotic agents: N/A 5. Neuropsych/cognition: This patient is capable of making decisions on her own behalf. 6. Skin/Wound Care: Routine skin checks 7. Fluids/Electrolytes/Nutrition:encouraging po.   4/21- I personally reviewed the patient's labs today.     -albumin still a  bit low--encourage po/proteins. We have discussed improving intake 8.  Dysphagia.  Dysphagia 3 with thins currently  4/18 intake ok    9.  Hyperlipidemia.  Crestor  10.  Recent History of cocaine/tobacco use.  UDS positive for cocaine.  Provide counseling, this has been a chronic issue , seen in PCP notes 11.  Hypothyroidism. continue Synthroid   12.  Constipation.   Senokot-S 2 tab twice daily  4/23- LBM yesterday  4/24- LBM 2 days ago but feels like needs to go this A 15. Blurry vision  3/27- will need Ophtho after d/c.   3-29: Seems to mostly be from the left eye, with difficulty converging and tracking; messaged OT and PT to trial taping versus alternating eye patch to help with convergence.  3-30: Reporting improvement with use of taping  4/4- pt reports everything blurry- cannot see faces as a result- never had eye checked in past- used readers but cannot see at a distance since stroke- will send to Ophtho after d/c.   4/7- d/w pt again- she wants eyeglasses- explained we cannot get when in hospital- also per guidelines, since vision can still change, to not get for 3 months after stroke. She disbelieves it's from stroke, since had 2 prior- that didn't cause vision changes- we educated pt pn how this can occur.   16.  Left hip pain.  -3-29 x-ray showing moderate degenerative joint disease of the hip; no fracture  -Continue Voltaren  gel to hip 4 times daily  -May benefit from steroid injection as an outpatient  3/31- started Vit D and Ca per pt request  - improved to an extent  4/24- Pt reports L hip still hurts when stands/works with therapy however don't want her on opiates due to can impair sensorium and she also ready has  issues  17. Severe HA-resolved  4/15-16 sx appear improved with topamax , tylenol  helps also   -may use sx to get out of therapy at times?  4/23- Per team, pt having HA's frequently which impairs therapy- she won't discuss HA's with me- unclear if only has a day  goes on vs self limting behavior  4/24- increase to 75 mg QHS 18. Decreased safety awareness/impulsiveness  4/22- still requiring max cues 20. Spasticity  4/18 tone appears controlled. Off baclofen , cont PT, OT  4/22- off baclofen , and c/o LUE pain- wondering how much of her complaints are due to tone?-  21. 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 .    22.  Urinary  and bowel incont due to CVA, cont toileting program 4/23- still cannot control B/B   23. 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  Also d/w nursing- still requiring full supervision when eats due to choking if tries ot talk and poor safety awareness   LOS: 31 days A FACE TO FACE EVALUATION WAS PERFORMED  Theresa Watkins 03/11/2024, 9:13 AM

## 2024-03-11 NOTE — Progress Notes (Incomplete)
 Recreational Therapy Session Note  Patient Details  Name: Theresa Watkins MRN: [id number] Date of Birth: [birth year] Today's Date: 03/11/2024  Pain: *** Skilled Therapeutic Interventions/Progress Updates: ***  Therapy/Group: {TR Therapy/Group:3049008}  Activity Level: {TR Activity Level:3049009} Level of assist: {TR Level of Assist:3049010}  Danalee Flath 03/11/2024, 8:32 AM

## 2024-03-12 DIAGNOSIS — I63511 Cerebral infarction due to unspecified occlusion or stenosis of right middle cerebral artery: Secondary | ICD-10-CM | POA: Diagnosis not present

## 2024-03-12 NOTE — Plan of Care (Signed)
  Problem: Consults Goal: RH STROKE PATIENT EDUCATION Description: See Patient Education module for education specifics  Outcome: Progressing   Problem: RH BOWEL ELIMINATION Goal: RH STG MANAGE BOWEL WITH ASSISTANCE Description: STG Manage Bowel with toileting Assistance. Outcome: Progressing Goal: RH STG MANAGE BOWEL W/MEDICATION W/ASSISTANCE Description: STG Manage Bowel with Medication with mod I Assistance. Outcome: Progressing   Problem: RH SAFETY Goal: RH STG ADHERE TO SAFETY PRECAUTIONS W/ASSISTANCE/DEVICE Description: STG Adhere to Safety Precautions With cues Assistance/Device. Outcome: Progressing   Problem: RH PAIN MANAGEMENT Goal: RH STG PAIN MANAGED AT OR BELOW PT'S PAIN GOAL Description: Pain managed < 4 with prns Outcome: Progressing   Problem: RH KNOWLEDGE DEFICIT Goal: RH STG INCREASE KNOWLEDGE OF HYPERTENSION Description: Patient and mother will be able to manage HTN using educational resources for medications and dietary modification independently Outcome: Progressing Goal: RH STG INCREASE KNOWLEDGE OF DYSPHAGIA/FLUID INTAKE Description: Patient and mother will be able to manage dysphagia using educational resources for medications and dietary modification independently Outcome: Progressing Goal: RH STG INCREASE KNOWLEGDE OF HYPERLIPIDEMIA Description: Patient and mother will be able to manage HLD using educational resources for medications and dietary modification independently Outcome: Progressing Goal: RH STG INCREASE KNOWLEDGE OF STROKE PROPHYLAXIS Description: Patient and mother will be able to manage secondary risks using educational resources for medications and dietary modification independently Outcome: Progressing   Problem: Consults Goal: RH GENERAL PATIENT EDUCATION Description: See Patient Education module for education specifics. Outcome: Progressing Goal: Skin Care Protocol Initiated - if Braden Score 18 or less Description: If consults are  not indicated, leave blank or document N/A Outcome: Progressing Goal: Nutrition Consult-if indicated Outcome: Progressing Goal: Diabetes Guidelines if Diabetic/Glucose > 140 Description: If diabetic or lab glucose is > 140 mg/dl - Initiate Diabetes/Hyperglycemia Guidelines & Document Interventions  Outcome: Progressing   Problem: RH BOWEL ELIMINATION Goal: RH STG MANAGE BOWEL WITH ASSISTANCE Description: STG Manage Bowel with Assistance. Outcome: Progressing Goal: RH STG MANAGE BOWEL W/MEDICATION W/ASSISTANCE Description: STG Manage Bowel with Medication with Assistance. Outcome: Progressing   Problem: RH BLADDER ELIMINATION Goal: RH STG MANAGE BLADDER WITH ASSISTANCE Description: STG Manage Bladder With Assistance Outcome: Progressing Goal: RH STG MANAGE BLADDER WITH MEDICATION WITH ASSISTANCE Description: STG Manage Bladder With Medication With Assistance. Outcome: Progressing Goal: RH STG MANAGE BLADDER WITH EQUIPMENT WITH ASSISTANCE Description: STG Manage Bladder With Equipment With Assistance Outcome: Progressing   Problem: RH SKIN INTEGRITY Goal: RH STG SKIN FREE OF INFECTION/BREAKDOWN Outcome: Progressing Goal: RH STG MAINTAIN SKIN INTEGRITY WITH ASSISTANCE Description: STG Maintain Skin Integrity With Assistance. Outcome: Progressing Goal: RH STG ABLE TO PERFORM INCISION/WOUND CARE W/ASSISTANCE Description: STG Able To Perform Incision/Wound Care With Assistance. Outcome: Progressing   Problem: RH SAFETY Goal: RH STG ADHERE TO SAFETY PRECAUTIONS W/ASSISTANCE/DEVICE Description: STG Adhere to Safety Precautions With Assistance/Device. Outcome: Progressing Goal: RH STG DECREASED RISK OF FALL WITH ASSISTANCE Description: STG Decreased Risk of Fall With Assistance. Outcome: Progressing   Problem: RH PAIN MANAGEMENT Goal: RH STG PAIN MANAGED AT OR BELOW PT'S PAIN GOAL Outcome: Progressing   Problem: RH KNOWLEDGE DEFICIT GENERAL Goal: RH STG INCREASE KNOWLEDGE OF  SELF CARE AFTER HOSPITALIZATION Outcome: Progressing

## 2024-03-12 NOTE — Plan of Care (Signed)
  Problem: Consults Goal: RH STROKE PATIENT EDUCATION Description: See Patient Education module for education specifics  Outcome: Progressing   Problem: RH BOWEL ELIMINATION Goal: RH STG MANAGE BOWEL WITH ASSISTANCE Description: STG Manage Bowel with toileting Assistance. Outcome: Progressing Goal: RH STG MANAGE BOWEL W/MEDICATION W/ASSISTANCE Description: STG Manage Bowel with Medication with mod I Assistance. Outcome: Progressing   Problem: RH SAFETY Goal: RH STG ADHERE TO SAFETY PRECAUTIONS W/ASSISTANCE/DEVICE Description: STG Adhere to Safety Precautions With cues Assistance/Device. Outcome: Progressing   Problem: RH PAIN MANAGEMENT Goal: RH STG PAIN MANAGED AT OR BELOW PT'S PAIN GOAL Description: Pain managed < 4 with prns Outcome: Progressing   Problem: RH KNOWLEDGE DEFICIT Goal: RH STG INCREASE KNOWLEDGE OF STROKE PROPHYLAXIS Description: Patient and mother will be able to manage secondary risks using educational resources for medications and dietary modification independently Outcome: Progressing

## 2024-03-12 NOTE — Progress Notes (Signed)
 Physical Therapy Weekly Progress Note  Patient Details  Name: Theresa Watkins MRN: [id number] Date of Birth: 1966/04/05  Beginning of progress report period: March 05, 2024 End of progress report period: March 12, 2024  Today's Date: 03/12/2024 PT Individual Time: 1100-1200 PT Individual Time Calculation (min): 60 min   Patient has met 1 of 1 short term goals.  Pt is progressing with transfers and gait, now requiring CGA for transfers with ideal set up and min a for less than ideal set up. Still limited by impulsivity and low safety awareness. Gait with hall rail or HHA and w/c follow with min-mod a, pt not currently able to ambulate functionally without skilled assist. Awaiting SNF d/c as pt's family members are unable to provide the appropriate care at home.   Patient continues to demonstrate the following deficits muscle weakness, impaired timing and sequencing, unbalanced muscle activation, decreased coordination, and decreased motor planning, and decreased sitting balance, decreased standing balance, hemiplegia, and decreased balance strategies and therefore will continue to benefit from skilled PT intervention to increase functional independence with mobility.  Patient progressing toward long term goals..  Continue plan of care.  PT Short Term Goals Week 4:  PT Short Term Goal 1 (Week 4): =LTGs d/t ELOS PT Short Term Goal 1 - Progress (Week 4): Met Week 5:  PT Short Term Goal 1 (Week 5): =LTGs d/t ELOS  Skilled Therapeutic Interventions/Progress Updates:    Session focused on gait training and ambulation for functional mobility and improved independence with transfers. No pain at rest, managed shoulder pain with sling. Bed mobility with supervision and increased time. Min a to assist pt with changing shirt, donning pants, and tot a to don shoes. Stand pivot transfer using bedrail and bathroom grab bar with min a (near CGA when moving to the R side). Clothing management with max a for  balance and to pull pants over hips. Gait with mod a HHA and +2 w/c follow 3 x 25-33 ft. Pt maintains crouched LLE posture and demoes short step length and inability to clear L foot. Utilized LUE sling for shoulder pain management. Pt wanted to try RW, set up with RW and attempted, but was willing to not use it once educated on being unable to use sling and RW together and realizing that LUE currently unable to grasp affectively. Pt returned to bed after continent bladder void at end of session ,was left with all needs in reach and alarm active.   Therapy Documentation Precautions:  Precautions Precautions: Fall Precaution/Restrictions Comments: L hemi Restrictions Weight Bearing Restrictions Per Provider Order: No General:     Therapy/Group: Individual Therapy  Tex Filbert 03/12/2024, 12:45 PM

## 2024-03-12 NOTE — Progress Notes (Signed)
 Speech Language Pathology Daily Session Note  Patient Details  Name: Theresa Watkins MRN: [id number] Date of Birth: Feb 24, 1966  Today's Date: 03/12/2024 SLP Individual Time: 0900-0958 SLP Individual Time Calculation (min): 58 min  Short Term Goals: Week 5: SLP Short Term Goal 1 (Week 5): Patient will increase speech intelligibility to 90% at the sentence level given min multimodal A  Skilled Therapeutic Interventions: Skilled therapy session focused on dysphagia and communication goals. Upon entrance, SLP observed patient during consumption of medications in puree and thin liquids via cup. Patient with timely swallow initiation, oral clearance and no s/sx of aspiration throughout. Continue current diet and medication administration method. SLP targeted communication goals through completion of respiratory and lingual strengthening exercises. Patient completed 5 sets of 5 repetitions of EMST (expiratory muscle strength training) set at 25 cm H2O. Patient then completed IOPI to target anterior and posterior lingual strength. Patient completed x10 repetitions anteriorly at 20kpa and posteriorly at 25kpa. Patient independently recalled 3/4 speech intelligibility strategies and required modA to recall remainder. Patient approximately 90% intelligible at the sentence level this date given supervisionA. Patient left in bed with alarm set and call bell in reach. Continue POC.   Pain Arm pain, no intervention requested  Therapy/Group: Individual Therapy  Sonora Catlin M.A., CCC-SLP 03/12/2024, 7:33 AM

## 2024-03-12 NOTE — Progress Notes (Signed)
 Occupational Therapy Session Note  Patient Details  Name: Theresa Watkins MRN: [id number] Date of Birth: [birth year]  Today's Date: 03/12/2024 OT Individual Time: 1400-1430 OT Individual Time Calculation (min): 30 min    Short Term Goals: Week 2:  OT Short Term Goal 1 (Week 2): Pt will complete PROM with LUE without VC OT Short Term Goal 1 - Progress (Week 2): Met OT Short Term Goal 2 (Week 2): Pt will complete shower transfer at Mod A with LRAD OT Short Term Goal 2 - Progress (Week 2): Met OT Short Term Goal 3 (Week 2): Pt will complete toilet transfers with mod A OT Short Term Goal 3 - Progress (Week 2): Met OT Short Term Goal 4 (Week 2): Pt will complete toileting tasks with mod A OT Short Term Goal 4 - Progress (Week 2): Progressing toward goal OT Short Term Goal 5 (Week 2): Pt will complete LB dressing tasks with mod A OT Short Term Goal 5 - Progress (Week 2): Progressing toward goal Week 3:  OT Short Term Goal 1 (Week 3): Pt will complete LB dressing tasks with mod A OT Short Term Goal 1 - Progress (Week 3): Progressing toward goal OT Short Term Goal 2 (Week 3): Pt will complete LB dressing tasks with mod A OT Short Term Goal 3 (Week 3): Pt will utilize LUE as gross assist during funcitonal tasks with mod A OT Short Term Goal 3 - Progress (Week 3): Progressing toward goal  Skilled Therapeutic Interventions/Progress Updates:    1:1 Pt received in the bed. Pt came to EOB with mod instructional cues to come off the right side of the bed by bringing her left UE across her body to come up on her right elbow. Pt transferred into w/c with min A. Taken into the dayroom to work on sit to stand with coming up into full stand with terminal left hip and knee extension. Focus on maintaining her balance on both feet while picking up cones from left environment field and matching them to colored disks on the right with her right hand (not having UE support in standing). REquired min facilitation  cues for left knee control. Also continued to practice sit to stands with increased forward weight shift and speed as well as controlled descents  back into the chair with forward weight shift.   Therapy Documentation Precautions:  Precautions Precautions: Fall Precaution/Restrictions Comments: L hemi Restrictions Weight Bearing Restrictions Per Provider Order: No  Pain: No reports of pain in session   Therapy/Group: Individual Therapy  [name] [facility] 03/12/2024, 3:45 PM

## 2024-03-12 NOTE — Progress Notes (Signed)
 PROGRESS NOTE   Subjective/Complaints:  Pt reports speech "goes" and gets loopy when takes Norco- wants ot focus on taking tylenol  with HA's- said don't want me to take it away, but will use rarely.  Kpad helps a lot.  Walked for first time yesterday!!!!  Also c/o L eye "tremors"  C/O a lot of gas as well.    ROS:    Pt denies SOB, abd pain, CP, N/V/C/D, and no new vision changes    (+) L wrist pain- slightly to a little better with wrist splint.    Objective:   No results found.    No results for input(s): "WBC", "HGB", "HCT", "PLT" in the last 72 hours.     No results for input(s): "NA", "K", "CL", "CO2", "GLUCOSE", "BUN", "CREATININE", "CALCIUM " in the last 72 hours.      Intake/Output Summary (Last 24 hours) at 03/12/2024 0803 Last data filed at 03/11/2024 1700 Gross per 24 hour  Intake 354 ml  Output --  Net 354 ml         Physical Exam: Vital Signs Blood pressure 107/75, pulse 71, temperature 98.2 F (36.8 C), temperature source Oral, resp. rate 16, height 5\' 6"  (1.676 m), weight 64.4 kg, SpO2 (!) 89%.        General: awake, alert, appropriate, supine in bed; , NAD HENT: dysconjugate gaze with EOM's;  with Nystagmus seen on L eye only; oropharynx moist CV: regular rate and rhythm; no JVD Pulmonary: CTA B/L; no W/R/R- good air movement GI: soft, NT, ND, (+)BS Psychiatric: appropriate- stretching on her memory this AM Neurological:  impulsive-slightly better dysarthria MSK_ L wrist still very TTP over dorsum of wrist and dorsum of hand looks mildly swollen- but brace has improved pain/TTP.   MAS of 1  in L shoulder and 1 in L elbow- none in L wrist or fingers so far- Hoffman's (+) in LUE- no change today since meds have worn off overnight  Mild to mod dysarthria.  Left central 7  Strength exam- LUE 2- to 2/5  LLE 3 to 3+/5.  -sensory exam normal. Prior neuro assessment is c/w today's  exam 03/12/2024.  Skin- L PIP of 2nd digit- has very large scab vs plaque    Musculoskeletal:  Left hip pain with IR/ER. Moved LLE easily in bed (flex/ext) without discomfort.  L wrist TTP across dorsum of L wrist- and ulnar styloid- pain with ROM of L wrist- in all directions. No heat, but questionable mild erythema.    Assessment/Plan: 1. Functional deficits which require 3+ hours per day of interdisciplinary therapy in a comprehensive inpatient rehab setting. Physiatrist is providing close team supervision and 24 hour management of active medical problems listed below. Physiatrist and rehab team continue to assess barriers to discharge/monitor patient progress toward functional and medical goals  Care Tool:  Bathing    Body parts bathed by patient: Face, Left arm, Chest, Abdomen, Front perineal area, Right upper leg, Left upper leg, Right lower leg, Left lower leg   Body parts bathed by helper: Right arm, Buttocks     Bathing assist Assist Level: Minimal Assistance - Patient > 75%     Upper Body  Dressing/Undressing Upper body dressing   What is the patient wearing?: Pull over shirt    Upper body assist Assist Level: Minimal Assistance - Patient > 75%    Lower Body Dressing/Undressing Lower body dressing      What is the patient wearing?: Pants, Underwear/pull up     Lower body assist Assist for lower body dressing: Minimal Assistance - Patient > 75%     Toileting Toileting    Toileting assist Assist for toileting: Moderate Assistance - Patient 50 - 74%     Transfers Chair/bed transfer  Transfers assist     Chair/bed transfer assist level: Minimal Assistance - Patient > 75%     Locomotion Ambulation   Ambulation assist      Assist level: Moderate Assistance - Patient 50 - 74% Assistive device: Hand held assist Max distance: 30   Walk 10 feet activity   Assist     Assist level: Moderate Assistance - Patient - 50 - 74% Assistive device: Hand  held assist   Walk 50 feet activity   Assist Walk 50 feet with 2 turns activity did not occur: Safety/medical concerns (L hemiplegia and pt fatigue)  Assist level: 2 helpers      Walk 150 feet activity   Assist Walk 150 feet activity did not occur: Safety/medical concerns (L hemiplegia and pt fatigue)         Walk 10 feet on uneven surface  activity   Assist Walk 10 feet on uneven surfaces activity did not occur: Safety/medical concerns (L hemiplegia and pt fatigue)         Wheelchair     Assist Is the patient using a wheelchair?: Yes Type of Wheelchair: Manual    Wheelchair assist level: Supervision/Verbal cueing Max wheelchair distance: 150    Wheelchair 50 feet with 2 turns activity    Assist        Assist Level: Supervision/Verbal cueing   Wheelchair 150 feet activity     Assist      Assist Level: Minimal Assistance - Patient > 75%   Blood pressure 107/75, pulse 71, temperature 98.2 F (36.8 C), temperature source Oral, resp. rate 16, height 5\' 6"  (1.676 m), weight 64.4 kg, SpO2 (!) 89%.   Medical Problem List and Plan: 1. 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 Search for facility ongoing. May only be able to go to custodial care facility  D/c SNF- hopefully by end of week/early next week  Con't CIR PT, OT and SLP P2P denied- appealed by family.  2. DVT/anticoagulation:  Pharmaceutical: Lovenox              -antiplatelet therapy: Aspirin  81 mg daily and Plavix  75 mg daily x 3 weeks then Plavix  alone 3. Pain Management: Tylenol  as needed  3/25- denies pain- con't regimen rpn   3-30: Ongoing left hip pain; Voltaren  gel and Tylenol .  3/31- will start Vit D and Ca per pt  request- has helped hip pain in past?  4/1- pt insistent was very helpful already for hip pain  4/2- pt wants at the same time- per chart, Ca and Vit D come at same time  4/3- having new  burning pain in LUE- which is seen sometimes with stroke- will add Duloxetine  30 mg daily for nerve pain- monitor for side effects.  -if gets nausea, will change to Lyrica 4/4-6 no side effects so far-  4/8- will increase Duloxetine  to 60 mg daily  4/21 back feels MUCH better with new mattress. 4/22- Pt reports 10/10 L wrist pain  -will get Xray-  4/23- Has severe L CMC arthritis- will order L wrist splint with coverage for [facility] joint if possible- can be ACE wrapped if needed- let therapy know 4/24- L wrist slightly to somewhat better with L wrist splint 4/25- doesn't want ot stop Norco, but doesn't want to take much since makes her loopy and affects her speech. Would rather take tylenol   4. Depression/Behavior/Sleep: Provide emotional support  4/21 mood improved--continue duloxetine              -antipsychotic agents: N/A 5. Neuropsych/cognition: This patient is capable of making decisions on her own behalf. 6. Skin/Wound Care: Routine skin checks 7. Fluids/Electrolytes/Nutrition:encouraging po.   4/21- I personally reviewed the patient's labs today.     -albumin still a bit low--encourage po/proteins. We have discussed improving intake 8.  Dysphagia.  Dysphagia 3 with thins currently  4/18 intake ok    9.  Hyperlipidemia.  Crestor  10.  Recent History of cocaine/tobacco use.  UDS positive for cocaine.  Provide counseling, this has been a chronic issue , seen in PCP notes 11.  Hypothyroidism. continue Synthroid   12.  Constipation.   Senokot-S 2 tab twice daily  4/23- LBM yesterday  4/24- LBM 2 days ago but feels like needs to go this AM  4/25- LBM yesterday 15. Blurry vision and dysconjugate gaze  3/27- will need Ophtho after d/c.   3-29: Seems to mostly be from the left eye, with difficulty converging  and tracking; messaged OT and PT to trial taping versus alternating eye patch to help with convergence.  3-30: Reporting improvement with use of taping  4/4- pt reports everything blurry- cannot see faces as a result- never had eye checked in past- used readers but cannot see at a distance since stroke- will send to Ophtho after d/c.   4/7- d/w pt again- she wants eyeglasses- explained we cannot get when in hospital- also per guidelines, since vision can still change, to not get for 3 months after stroke. She disbelieves it's from stroke, since had 2 prior- that didn't cause vision changes- we educated pt pn how this can occur.   4/25- reminded pt cannot get Optho in hospital- need to get once d/c'd 16.  Left hip pain.  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 Severe HA-r  4/15-16 sx appear improved with topamax , tylenol  helps also   -may use sx to get out of therapy at times?  4/23- Per team, pt having HA's frequently which impairs therapy- she won't discuss HA's with me- unclear if only has a day goes on vs self limting behavior  4/24- increase to 75 mg at bedtime  4/25- still having HA's, actually thinks a little worse since not  taking Norco for it-  18. Decreased safety awareness/impulsiveness  4/22- still requiring max cues 20. Spasticity  4/18 tone appears controlled. Off baclofen , cont PT, OT  4/22- off baclofen , and c/o LUE pain- wondering how much of her complaints are due to tone?-  21. 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 .    22.  Urinary  and bowel incont due to CVA, cont toileting program 4/23- still cannot control B/B   23. 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 Gassy  4/25- asked pt to refrain from using Sodas and straws if possible   I spent a total of  39  minutes on total care today- >50% coordination of care- due to  Prolonged d/w pt about her medical issues as above- gassiness, L eye nystagmus; pain and Norco.    LOS: 32 days A FACE TO FACE EVALUATION WAS PERFORMED  Maelys Kinnick 03/12/2024, 8:03 AM

## 2024-03-12 NOTE — Progress Notes (Addendum)
 Patient ID: Theresa Watkins, female   DOB: 1965/12/31, 58 y.o.   MRN: [id number]  SW left message for [name]/Admissions with [employer] and waiting on follow-up.   SW left message for Molly/Admissions with [facility] [place] to discuss be offers.  SW called insurance to follow-up about status of appeal. SW spoke with Demetrius who reported it shows the appeal was dismissed indicating after P2P was conducted a Written request was not submitted. SW informed written request was submitted with clinicals faxed yesterday. She will transfer to utilization specialist. SW spoke with Danielle/UM Dept to discuss above. She said she does not see any denial in system. She provided appeals email ([email or web address]); Requisition #[id number]. SW [employer] and included clinicals again. SW waiting on follow-up.   Member Line# [phone] 1501-SW received email from appeals department with [facility] [employer] indicating since written appeal was not signed. A new appeal can be submitted within 60 days.   32- SW called pt son Vonii to inform on above. SW discussed in length that patient will continue to accrue charges for CIR cost because of insurance denial for coverage at this time, and new appeal needed.  He understands if insurance denies again, SW will place an APS referral since we understand that they are not able to provide care to patient. He will follow-up with SW on when they can come sign the letter.  *Reports he will be to the hospital shortly, to sign letter.    410pm- SW met with pt son and mother to complete appeal with Claudis Cumber; Requisition# [id number]/ call ref [phone]. SW faxed appeal to [phone] and emailed to address listed above.  Norval Been, MSW, LCSW Office: [phone] Cell: [phone] Fax: [phone]

## 2024-03-13 DIAGNOSIS — I69354 Hemiplegia and hemiparesis following cerebral infarction affecting left non-dominant side: Principal | ICD-10-CM

## 2024-03-13 DIAGNOSIS — I63511 Cerebral infarction due to unspecified occlusion or stenosis of right middle cerebral artery: Secondary | ICD-10-CM | POA: Diagnosis not present

## 2024-03-13 MED ORDER — ACETAMINOPHEN-CODEINE 300-30 MG PO TABS
1.0000 | ORAL_TABLET | Freq: Three times a day (TID) | ORAL | Status: DC | PRN
  Administered 2024-03-13 – 2024-04-27 (×42): 1 via ORAL
  Filled 2024-03-13 (×47): qty 1

## 2024-03-13 NOTE — Progress Notes (Signed)
 Physical Therapy Session Note  Patient Details  Name: Theresa Watkins MRN: [id number] Date of Birth: Mar 20, 1966  Today's Date: 03/13/2024 PT Individual Time: 1305-1350 PT Individual Time Calculation (min): 45 min   Short Term Goals: Week 5:  PT Short Term Goal 1 (Week 5): =LTGs d/t ELOS  Skilled Therapeutic Interventions/Progress Updates: Pt presented in bed agreeable to therapy. Pt states unrated pain L shoulder, no medical intervention requested but rest and repositioning provided as needed. Pt completed supine to sit with supervision and heavy use of bed features. PTA threaded pants total A and donned shoes total A. With use of foot board pt performed Sit to stand with minA and required modA to pull pants over hips. Completed stand step transfer with modA to R tpo w/c. Pt transported to main gym hallway and requested to work on gait. Participated in x 3 bouts of gait with use of wall rail 3ft each. Pt ambulated with narrow BOS with intermittent scissoring. Pt also noted to demonstrate hip and knee flexion in stance phase of LLE but intermittently with multimodal cues able to maintain TKE (still demonstrated hip flexion). Pt attempted ~58ft with only HHA and +2 w/c follow however noted increased B knee flexion. Discussed potential use of Lite Gait in future sessions with pt agreeable to trial. Pt transported back to room and completed modified stand pivot transfer with use of bed rail. With use of gait belt attached to foot board pt was able to complete sit to supine with supervision and significantly increased time. Pt handed off to SLP for next session with bed alarm on, call bell within reach and needs met.      Therapy Documentation Precautions:  Precautions Precautions: Fall Precaution/Restrictions Comments: L hemi Restrictions Weight Bearing Restrictions Per Provider Order: No General:   Vital Signs: Therapy Vitals Temp: 97.7 F (36.5 C) Pulse Rate: 72 Resp: 17 BP:  109/75 Patient Position (if appropriate): Sitting Oxygen Therapy SpO2: 96 % O2 Device: Room Air Pain: Pain Assessment Pain Scale: 0-10 Pain Score: 0-No pain       Therapy/Group: Individual Therapy  Shatina Streets 03/13/2024, 4:17 PM

## 2024-03-13 NOTE — Progress Notes (Addendum)
 PROGRESS NOTE   Subjective/Complaints:  Patient eating lunch today.  No pain complaints.  Notes some swelling in the left upper extremity. ROS:    Pt denies SOB, abd pain, CP, N/V/C/D, and no new vision changes    (+) L wrist pain- slightly to a little better with wrist splint.    Objective:   No results found.    No results for input(s): "WBC", "HGB", "HCT", "PLT" in the last 72 hours.     No results for input(s): "NA", "K", "CL", "CO2", "GLUCOSE", "BUN", "CREATININE", "CALCIUM " in the last 72 hours.      Intake/Output Summary (Last 24 hours) at 03/13/2024 1209 Last data filed at 03/13/2024 0800 Gross per 24 hour  Intake 580 ml  Output --  Net 580 ml         Physical Exam: Vital Signs Blood pressure 123/80, pulse 70, temperature 97.9 F (36.6 C), resp. rate 18, height 5\' 6"  (1.676 m), weight 64.4 kg, SpO2 99%.  General: No acute distress Mood and affect are appropriate Heart: Regular rate and rhythm no rubs murmurs or extra sounds Lungs: Clear to auscultation, breathing unlabored, no rales or wheezes Abdomen: Positive bowel sounds, soft nontender to palpation, nondistended Extremities: No clubbing, cyanosis, or edema Skin: No evidence of breakdown, no evidence of rash  Motor strength is essentially 0/5 in the left upper extremity has some trace flexion at the finger flexors. Left lower extremity 3 - at the hip flexor knee extensor ankle dorsiflexor  Dependent edema left hand and wrist.  No hypersensitivity to touch.  No temperature change. -sensory exam normal. Prior neuro assessment is c/w today's exam 03/13/2024.  Skin- L PIP of 2nd digit- has very large scab vs plaque    Musculoskeletal:  Left hip pain with IR/ER. Moved LLE easily in bed (flex/ext) without discomfort.  L wrist TTP across dorsum of L wrist- and ulnar styloid- pain with ROM of L wrist- in all directions. No heat, but questionable  mild erythema.    Assessment/Plan: 1. Functional deficits which require 3+ hours per day of interdisciplinary therapy in a comprehensive inpatient rehab setting. Physiatrist is providing close team supervision and 24 hour management of active medical problems listed below. Physiatrist and rehab team continue to assess barriers to discharge/monitor patient progress toward functional and medical goals  Care Tool:  Bathing    Body parts bathed by patient: Face, Left arm, Chest, Abdomen, Front perineal area, Right upper leg, Left upper leg, Right lower leg, Left lower leg   Body parts bathed by helper: Right arm, Buttocks     Bathing assist Assist Level: Minimal Assistance - Patient > 75%     Upper Body Dressing/Undressing Upper body dressing   What is the patient wearing?: Pull over shirt    Upper body assist Assist Level: Minimal Assistance - Patient > 75%    Lower Body Dressing/Undressing Lower body dressing      What is the patient wearing?: Pants, Underwear/pull up     Lower body assist Assist for lower body dressing: Minimal Assistance - Patient > 75%     Toileting Toileting    Toileting assist Assist for toileting: Moderate Assistance -  Patient 50 - 74%     Transfers Chair/bed transfer  Transfers assist     Chair/bed transfer assist level: Minimal Assistance - Patient > 75%     Locomotion Ambulation   Ambulation assist      Assist level: Moderate Assistance - Patient 50 - 74% Assistive device: Hand held assist Max distance: 30   Walk 10 feet activity   Assist     Assist level: Moderate Assistance - Patient - 50 - 74% Assistive device: Hand held assist   Walk 50 feet activity   Assist Walk 50 feet with 2 turns activity did not occur: Safety/medical concerns (L hemiplegia and pt fatigue)  Assist level: 2 helpers      Walk 150 feet activity   Assist Walk 150 feet activity did not occur: Safety/medical concerns (L hemiplegia and pt  fatigue)         Walk 10 feet on uneven surface  activity   Assist Walk 10 feet on uneven surfaces activity did not occur: Safety/medical concerns (L hemiplegia and pt fatigue)         Wheelchair     Assist Is the patient using a wheelchair?: Yes Type of Wheelchair: Manual    Wheelchair assist level: Supervision/Verbal cueing Max wheelchair distance: 150    Wheelchair 50 feet with 2 turns activity    Assist        Assist Level: Supervision/Verbal cueing   Wheelchair 150 feet activity     Assist      Assist Level: Minimal Assistance - Patient > 75%   Blood pressure 123/80, pulse 70, temperature 97.9 F (36.6 C), resp. rate 18, height 5\' 6"  (1.676 m), weight 64.4 kg, SpO2 99%.   Medical Problem List and Plan: 1. 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 Search for facility ongoing. May only be able to go to custodial care facility  D/c SNF- hopefully by end of week/early next week  Con't CIR PT, OT and SLP P2P denied- appealed by family.  2. DVT/anticoagulation:  Pharmaceutical: Lovenox              -antiplatelet therapy: Aspirin  81 mg daily and Plavix  75 mg daily x 3 weeks then Plavix  alone 3. Pain Management: Tylenol  as needed  3/25- denies pain- con't regimen rpn   3-30: Ongoing left hip pain; Voltaren  gel and Tylenol .    4/3- having new burning pain in LUE- which is seen sometimes with stroke- will add Duloxetine  30 mg daily for nerve pain- monitor for side effects.  -if gets nausea, will change to Lyrica 4/4-6 no side effects so far-  4/8- will increase Duloxetine  to 60 mg daily  DC hydrocodone  due to side effects, trial tyl #3  as needed for pain that is not relieved by  Tylenol . 4. Depression/Behavior/Sleep: Provide emotional support  4/21 mood improved--continue duloxetine              -antipsychotic agents: N/A 5. Neuropsych/cognition: This patient is capable of making decisions on her own behalf. 6. Skin/Wound Care: Routine skin checks 7. Fluids/Electrolytes/Nutrition:encouraging po.   4/21- I personally reviewed the patient's labs today.     -albumin still a bit low--encourage  po/proteins. We have discussed improving intake 8.  Dysphagia.  Dysphagia 3 with thins currently  4/18 intake ok    9.  Hyperlipidemia.  Crestor  10.  Recent History of cocaine/tobacco use.  UDS positive for cocaine.  Provide counseling, this has been a chronic issue , seen in PCP notes 11.  Hypothyroidism. continue Synthroid   12.  Constipation.   Senokot-S 2 tab twice daily  4/23- LBM yesterday  4/24- LBM 2 days ago but feels like needs to go this AM  4/25- LBM yesterday 15. Blurry vision and dysconjugate gaze  3/27- will need Ophtho after d/c.   3-29: Seems to mostly be from the left eye, with difficulty converging and tracking; messaged OT and PT to trial taping versus alternating eye patch to help with convergence.  3-30: Reporting improvement with use of taping  4/4- pt reports everything blurry- cannot see faces as a result- never had eye checked in past- used readers but cannot see at a distance since stroke- will send to Ophtho after d/c.   4/7- d/w pt again- she wants eyeglasses- explained we cannot get when in hospital- also per guidelines, since vision can still change, to not get for 3 months after stroke. She disbelieves it's from stroke, since had 2 prior- that didn't cause vision changes- we educated pt pn how this can occur.   4/25- reminded pt cannot get Optho in hospital- need to get once d/c'd 16.  Left hip pain.  -3-29 x-ray showing moderate degenerative joint disease of the hip; no fracture  -Continue Voltaren  gel to hip 4 times daily  -May benefit from  steroid injection as an outpatient  3/31- started Vit D and Ca per pt request  - improved to an extent  4/24- Pt reports L hip still hurts when stands/works with therapy however don't want her on opiates regularly due to can impair sensorium and she also ready has issues  17. Severe HA-r  4/15-16 sx appear improved with topamax , tylenol  helps also   -may use sx to get out of therapy at times?  4/23- Per team, pt having HA's frequently which impairs therapy- she won't discuss HA's with me- unclear if only has a day goes on vs self limting behavior  4/24- increase to 75 mg at bedtime  4/25- still having HA's, actually thinks a little worse since not taking Norco for it-  18. Decreased safety awareness/impulsiveness  4/22- still requiring max cues 20. Spasticity   4/22- off baclofen , and c/o LUE pain- wondering how much of her complaints are due to tone?-  21. 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 .    22.  Urinary  and bowel incont due to CVA, cont toileting program 4/23- still cannot control B/B   23. 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 Gassy  4/25- asked pt to refrain from using Sodas and straws if possible    LOS: 33 days A FACE TO FACE EVALUATION WAS PERFORMED  Theresa Watkins 03/13/2024, 12:09 PM

## 2024-03-13 NOTE — Progress Notes (Signed)
 Speech Language Pathology Daily Session Note  Patient Details  Name: DELIAH KOTTKE MRN: [id number] Date of Birth: 06/08/1966  Today's Date: 03/13/2024 SLP Individual Time: 1350-1431 SLP Individual Time Calculation (min): 41 min  Short Term Goals: Week 5: SLP Short Term Goal 1 (Week 5): Patient will increase speech intelligibility to 90% at the sentence level given min multimodal A  Skilled Therapeutic Interventions: SLP conducted skilled therapy session targeting communication goals. Facilitated sentence and structured conversation level tasks encouraging use of speech intelligibility increasing strategies. At these utterance levels, patient benefited from min assist to implement strategies to maintain 90% accuracy. Patient was left in room with call bell in reach and alarm set. SLP will continue to target goals per plan of care.        Pain Pain Assessment Pain Scale: 0-10 Pain Score: 0-No pain Pain Location: Hand Pain Intervention(s): Medication (See eMAR)  Therapy/Group: Individual Therapy  Lenae Wherley, M.A., CCC-SLP  Tuesday Terlecki A Cale Bethard 03/13/2024, 2:31 PM

## 2024-03-13 NOTE — Progress Notes (Signed)
 Occupational Therapy Session Note  Patient Details  Name: Theresa Watkins MRN: [id number] Date of Birth: [birth year]  Today's Date: 03/13/2024 OT Individual Time: 1000-1100 OT Individual Time Calculation (min): 60 min    Short Term Goals: Week 5:  OT Short Term Goal 1 (Week 5): Pt will complete LB dressing tasks with mod A OT Short Term Goal 2 (Week 5): Pt will perform UB dressing tasks with min A OT Short Term Goal 3 (Week 5): Pt will perfrom toileting tasks with min A  Skilled Therapeutic Interventions/Progress Updates:    Pt greeted semi-reclined in bed and agreeable to OT treatment session. Pt reported that she needed to use the bathroom quickly. Stedy used for urgency. Pt able to use hand rails of bed to come to sitting EOB with min A to get L hip all the way around. Sit<>stand using Stedy and min A. Pt transferred onto commode using Stedy. Pt voided bowel and bladder. Worked on hip hike to reach behind and assist with posterior peri-care. Pt then ambulated to shower seat with mod HHA from OT. Educated on head/hips relationship for scooting further on tub seat with min A. Bathing completed using LH sponge end education for hemi bathing techniques, overall min A. Stand-pivots shower seat>wc>EOB with increased time and min A. Dressing tasks completed at EOB with overall mod A> Pt returned to bed and left semi-reclined in bed with bed alarm on, call bell in reach, and needs met.  Therapy Documentation Precautions:  Precautions Precautions: Fall Precaution/Restrictions Comments: L hemi Restrictions Weight Bearing Restrictions Per Provider Order: No Pain: Pain Assessment Pain Scale: 0-10 Pain Score: 0-No pain    Therapy/Group: Individual Therapy  Lethia Raveling 03/13/2024, 10:14 AM

## 2024-03-14 DIAGNOSIS — I69354 Hemiplegia and hemiparesis following cerebral infarction affecting left non-dominant side: Secondary | ICD-10-CM | POA: Diagnosis not present

## 2024-03-14 DIAGNOSIS — I63511 Cerebral infarction due to unspecified occlusion or stenosis of right middle cerebral artery: Secondary | ICD-10-CM | POA: Diagnosis not present

## 2024-03-14 NOTE — Progress Notes (Signed)
 NT assisted patient with dinner (full supervision).   Randeen Busman, LPN

## 2024-03-14 NOTE — Progress Notes (Signed)
 Therapist updated safety plan. Educated patient on current safety plan to ensure safety at all times. Patient verbalized understanding.  Randeen Busman, LPN

## 2024-03-14 NOTE — Progress Notes (Addendum)
 PROGRESS NOTE   Subjective/Complaints:  No issues overnite   ROS:   Pt denies SOB, abd pain, CP, N/V/C/D,   Objective:   No results found.    No results for input(s): "WBC", "HGB", "HCT", "PLT" in the last 72 hours.     No results for input(s): "NA", "K", "CL", "CO2", "GLUCOSE", "BUN", "CREATININE", "CALCIUM " in the last 72 hours.      Intake/Output Summary (Last 24 hours) at 03/14/2024 1049 Last data filed at 03/14/2024 0800 Gross per 24 hour  Intake 712 ml  Output 1 ml  Net 711 ml         Physical Exam: Vital Signs Blood pressure 115/79, pulse 67, temperature 98.4 F (36.9 C), resp. rate 18, height 5\' 6"  (1.676 m), weight 64.4 kg, SpO2 93%.  General: No acute distress Mood and affect are appropriate Heart: Regular rate and rhythm no rubs murmurs or extra sounds Lungs: Clear to auscultation, breathing unlabored, no rales or wheezes Abdomen: Positive bowel sounds, soft nontender to palpation, nondistended Extremities: No clubbing, cyanosis, or edema Skin: No evidence of breakdown, no evidence of rash  Motor strength 0/5 LUE 3-/5 LLE 5/5 on right side   Dependent edema left hand and wrist.  No hypersensitivity to touch.  No temperature change. -sensory exam normal. Prior neuro assessment is c/w today's exam 03/14/2024.  Skin- L PIP of 2nd digit- has scab     Musculoskeletal:  No tenderness over the left wrist.   Assessment/Plan: 1. Functional deficits which require 3+ hours per day of interdisciplinary therapy in a comprehensive inpatient rehab setting. Physiatrist is providing close team supervision and 24 hour management of active medical problems listed below. Physiatrist and rehab team continue to assess barriers to discharge/monitor patient progress toward functional and medical goals  Care Tool:  Bathing    Body parts bathed by patient: Face, Left arm, Chest, Abdomen, Front perineal  area, Right upper leg, Left upper leg, Right lower leg, Left lower leg   Body parts bathed by helper: Right arm, Buttocks     Bathing assist Assist Level: Minimal Assistance - Patient > 75%     Upper Body Dressing/Undressing Upper body dressing   What is the patient wearing?: Pull over shirt    Upper body assist Assist Level: Minimal Assistance - Patient > 75%    Lower Body Dressing/Undressing Lower body dressing      What is the patient wearing?: Pants, Underwear/pull up     Lower body assist Assist for lower body dressing: Minimal Assistance - Patient > 75%     Toileting Toileting    Toileting assist Assist for toileting: Moderate Assistance - Patient 50 - 74%     Transfers Chair/bed transfer  Transfers assist     Chair/bed transfer assist level: Minimal Assistance - Patient > 75%     Locomotion Ambulation   Ambulation assist      Assist level: Moderate Assistance - Patient 50 - 74% Assistive device: Hand held assist Max distance: 30   Walk 10 feet activity   Assist     Assist level: Moderate Assistance - Patient - 50 - 74% Assistive device: Hand held assist   Walk  50 feet activity   Assist Walk 50 feet with 2 turns activity did not occur: Safety/medical concerns (L hemiplegia and pt fatigue)  Assist level: 2 helpers      Walk 150 feet activity   Assist Walk 150 feet activity did not occur: Safety/medical concerns (L hemiplegia and pt fatigue)         Walk 10 feet on uneven surface  activity   Assist Walk 10 feet on uneven surfaces activity did not occur: Safety/medical concerns (L hemiplegia and pt fatigue)         Wheelchair     Assist Is the patient using a wheelchair?: Yes Type of Wheelchair: Manual    Wheelchair assist level: Supervision/Verbal cueing Max wheelchair distance: 150    Wheelchair 50 feet with 2 turns activity    Assist        Assist Level: Supervision/Verbal cueing   Wheelchair 150  feet activity     Assist      Assist Level: Minimal Assistance - Patient > 75%   Blood pressure 115/79, pulse 67, temperature 98.4 F (36.9 C), resp. rate 18, height 5\' 6"  (1.676 m), weight 64.4 kg, SpO2 93%.   Medical Problem List and Plan: 1. 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 Search for facility ongoing. May only be able to go to custodial care facility  D/c SNF- hopefully by end of week/early next week  Con't CIR PT, OT and SLP P2P denied- appealed by family.  2. DVT/anticoagulation:  Pharmaceutical: Lovenox              -antiplatelet therapy: Aspirin  81 mg daily and Plavix  75 mg daily x 3 weeks then Plavix  alone 3. Pain Management: Tylenol  as needed  3/25- denies pain- con't regimen rpn   3-30: Ongoing left hip pain; Voltaren  gel and Tylenol .    4/3- having new burning pain in LUE- which is seen sometimes with stroke- will add Duloxetine  30 mg daily for nerve pain- monitor for side effects.  -if gets nausea, will change to Lyrica 4/4-6 no side effects so far-  4/8- will increase Duloxetine  to 60 mg daily  DC hydrocodone  due to side effects, trial tyl #3  as needed for pain that is not relieved by Tylenol . 4. Depression/Behavior/Sleep: Provide emotional support  4/21 mood improved--continue duloxetine              -antipsychotic agents: N/A 5. Neuropsych/cognition: This patient is capable of making decisions on her own behalf. 6. Skin/Wound Care: Routine skin checks 7. Fluids/Electrolytes/Nutrition:encouraging po.   4/21- I personally reviewed the patient's labs today.     -albumin still a bit low--encourage po/proteins. We have discussed improving intake 8.  Dysphagia.  Dysphagia 3 with thins  currently  4/18 intake ok    9.  Hyperlipidemia.  Crestor  10.  Recent History of cocaine/tobacco use.  UDS positive for cocaine.  Provide counseling, this has been a chronic issue , seen in PCP notes 11.  Hypothyroidism. continue Synthroid   12.  Constipation.   Senokot-S 2 tab twice daily  4/23- LBM yesterday  4/24- LBM 2 days ago but feels like needs to go this AM  4/25- LBM yesterday  15. Blurry vision and dysconjugate gaze  3/27- will need Ophtho after d/c.   3-29: Seems to mostly be from the left eye, with difficulty converging and tracking; messaged OT and PT to trial taping versus alternating eye patch to help with convergence.  3-30: Reporting improvement with use of taping  4/4- pt reports everything blurry- cannot see faces as a result- never had eye checked in past- used readers but cannot see at a distance since stroke- will send to Ophtho after d/c.   4/7- d/w pt again- she wants eyeglasses- explained we cannot get when in hospital- also per guidelines, since vision can still change, to not get for 3 months after stroke. She disbelieves it's from stroke, since had 2 prior- that didn't cause vision changes- we educated pt pn how this can occur.   4/25- reminded pt cannot get Optho in hospital- need to get once d/c'd 16.  Left hip pain.  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 Severe HA-r  4/15-16 sx appear improved with topamax , tylenol  helps also   -may use sx to get out of therapy at times?  4/23- Per team, pt having HA's frequently which impairs therapy- she won't discuss HA's with me- unclear if only  has a day goes on vs self limting behavior  4/24- increase to 75 mg at bedtime  4/25- still having HA's, actually thinks a little worse since not taking Norco for it-  18. Decreased safety awareness/impulsiveness  4/22- still requiring max cues 20. Spasticity   4/22- off baclofen , and c/o LUE pain- wondering how much of her complaints are due to tone?-  21. 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 .    22.  Urinary  and bowel incont due to CVA, cont toileting program 4/23- still cannot control B/B   23. 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

## 2024-03-14 NOTE — Progress Notes (Signed)
 NT assisted patient with lunch(full supervision).   Randeen Busman, LPN

## 2024-03-14 NOTE — Plan of Care (Signed)
  Problem: Consults Goal: RH STROKE PATIENT EDUCATION Description: See Patient Education module for education specifics  Outcome: Progressing   Problem: RH BOWEL ELIMINATION Goal: RH STG MANAGE BOWEL WITH ASSISTANCE Description: STG Manage Bowel with toileting Assistance. Outcome: Progressing   Problem: RH SAFETY Goal: RH STG ADHERE TO SAFETY PRECAUTIONS W/ASSISTANCE/DEVICE Description: STG Adhere to Safety Precautions With cues Assistance/Device. Outcome: Progressing   Problem: RH PAIN MANAGEMENT Goal: RH STG PAIN MANAGED AT OR BELOW PT'S PAIN GOAL Description: Pain managed < 4 with prns Outcome: Progressing   Problem: RH KNOWLEDGE DEFICIT Goal: RH STG INCREASE KNOWLEDGE OF HYPERTENSION Description: Patient and mother will be able to manage HTN using educational resources for medications and dietary modification independently Outcome: Progressing Goal: RH STG INCREASE KNOWLEDGE OF DYSPHAGIA/FLUID INTAKE Description: Patient and mother will be able to manage dysphagia using educational resources for medications and dietary modification independently Outcome: Progressing Goal: RH STG INCREASE KNOWLEGDE OF HYPERLIPIDEMIA Description: Patient and mother will be able to manage HLD using educational resources for medications and dietary modification independently Outcome: Progressing Goal: RH STG INCREASE KNOWLEDGE OF STROKE PROPHYLAXIS Description: Patient and mother will be able to manage secondary risks using educational resources for medications and dietary modification independently Outcome: Progressing   Problem: RH BOWEL ELIMINATION Goal: RH STG MANAGE BOWEL WITH ASSISTANCE Description: STG Manage Bowel with Assistance. Outcome: Progressing Goal: RH STG MANAGE BOWEL W/MEDICATION W/ASSISTANCE Description: STG Manage Bowel with Medication with Assistance. Outcome: Progressing   Problem: RH SAFETY Goal: RH STG ADHERE TO SAFETY PRECAUTIONS W/ASSISTANCE/DEVICE Description:  STG Adhere to Safety Precautions With Assistance/Device. Outcome: Progressing Goal: RH STG DECREASED RISK OF FALL WITH ASSISTANCE Description: STG Decreased Risk of Fall With Assistance. Outcome: Progressing

## 2024-03-14 NOTE — Progress Notes (Signed)
 Physical Therapy Session Note  Patient Details  Name: Theresa Watkins MRN: [id number] Date of Birth: 01/04/1966  Today's Date: 03/14/2024 PT Individual Time: 1300-1345 PT Individual Time Calculation (min): 45 min   Short Term Goals: Week 4:  PT Short Term Goal 1 (Week 4): =LTGs d/t ELOS PT Short Term Goal 1 - Progress (Week 4): Met  Skilled Therapeutic Interventions/Progress Updates: Pt presents semi-reclined in bed and agreeable to therapy.  Pt transfers to sitting EOB w/ supervision.  Shoes donned at EOB as well as  wrist splint.  Pt performed sit to stand w/ mod/min A and cues for L foot placement.  Pt performed stepping w/ cues for HHA and sequencing.  Pt wheeled to dayroom.  Pt performed gait w/ R HHA and mod A, cueing for LLE advancement and placement, tends to decrease BOS, as well as weight acceptance to LLE.  Pt amb x 50', 30' and 15'.  Pt amb w/ improved upright trunk, but L knee buckling.  Pt returned to room and transferred sit to supine/sidelying w/ supervision.  Bed alarm on and all needs in reach.     Therapy Documentation Precautions:  Precautions Precautions: Fall Precaution/Restrictions Comments: L hemi Restrictions Weight Bearing Restrictions Per Provider Order: No General:   Vital Signs: Therapy Vitals Temp: 97.9 F (36.6 C) Temp Source: Oral Pulse Rate: 72 Resp: 17 BP: 107/70 Patient Position (if appropriate): Lying Oxygen Therapy SpO2: 96 % O2 Device: Room Air Pain:0/10 Pain Assessment Pain Scale: 0-10 Pain Score: 0-No pain    Therapy/Group: Individual Therapy  [name] P [name] 03/14/2024, 1:48 PM

## 2024-03-14 NOTE — Progress Notes (Signed)
 Physical Therapy Session Note  Patient Details  Name: Theresa Watkins MRN: [id number] Date of Birth: 17-Sep-1966  Today's Date: 03/14/2024 PT Individual Time: 4580-9983 PT Individual Time Calculation (min): 49 min   Short Term Goals: Week 5:  PT Short Term Goal 1 (Week 5): =LTGs d/t ELOS  Skilled Therapeutic Interventions/Progress Updates:      Pt supine in bed upon arrival. Pt agreeable to therapy. Pt denies any pain.   Pt performed supine to sit without use of gait belt (attached to foot board of bed) with supervision with encouragement from therapist and verbal cues provided for technique.   Pt donned shoes while seated EOB with CGA for correction of posterior bias with donning R LE with use of figure 4 position.   Pt performed stand step transfer bed to WC to L with min-mod A verbal and tactile cues provided for anterior weight shift for power up and lateral weight shift for stepping.   Pt ambulated 50 feet with L HHA, and R UE support on rail with +2 for WC follow, verbal cues provided for heel strike L LE, and tactile cues provided for L LE quad activation especially with R LE advancement (during areas of walkway handrail unavailable).   Attempted gait in main gym with L HHA only however discontinued for safety 2/2 increased L LE buckling, and lateral LOB to R requiring mod A with fatigue.   Pt ambulated 75 feet, and 200 feet (main gym to room)  with eva walker with assist for correction of lateral deivation to the L, verbal cues provided for L LE heel strike with L LE advancment.   Therapist requesting to use bathroom. Nursing present, therapist updated safety plan to update +1 for steady transfer.    Pt with nurse in room at end of session.   Therapy Documentation Precautions:  Precautions Precautions: Fall Precaution/Restrictions Comments: L hemi Restrictions Weight Bearing Restrictions Per Provider Order: No   Therapy/Group: Individual Therapy  [facility] Jenney Modest, [place], DPT  03/14/2024, 4:26 PM

## 2024-03-14 NOTE — Progress Notes (Signed)
 Occupational Therapy Session Note  Patient Details  Name: Theresa Watkins MRN: [id number] Date of Birth: [birth year]  Today's Date: 03/14/2024 OT Individual Time: 4098-1191 OT Individual Time Calculation (min): 51 min    Short Term Goals: Week 3:  OT Short Term Goal 1 (Week 3): Pt will complete LB dressing tasks with mod A OT Short Term Goal 1 - Progress (Week 3): Progressing toward goal OT Short Term Goal 2 (Week 3): Pt will complete LB dressing tasks with mod A OT Short Term Goal 3 (Week 3): Pt will utilize LUE as gross assist during funcitonal tasks with mod A OT Short Term Goal 3 - Progress (Week 3): Progressing toward goal  Skilled Therapeutic Interventions/Progress Updates:  Pt greeted supine in bed, pt agreeable to OT intervention.      Transfers/bed mobility/functional mobility:  Pt completed supine>sit to R side of bed with MIN A to elevate trunk into sitting. Pt requested to use the stedy for a toilet transfer d/t urgency. Pt stood to stedy with CGA. Dependent transfer into bathroom in stedy. Pts brief noted to be saturated already.   Pt able to stand with MIN A from EOB with HHA during ADL tasks.   ADLs:  Grooming:  UB dressing:pt donned OH shirt from EOB with MINA, pt able to recall hemi technique but needed assist to initially thread LUE first LB dressing: pt donned pants and brief with MIN A, assist needed when standing to pull pants to waist line.    Bathing: pt completed bathing seated on TTB with MINA needing assist to wash LUE and lower legs  Toileting: MODA for 3/3 toileting tasks, incontinent bladder void.                  Ended session with pt supine in bed with all needs within reach and bed alarm activated.                    Therapy Documentation Precautions:  Precautions Precautions: Fall Precaution/Restrictions Comments: L hemi Restrictions Weight Bearing Restrictions Per Provider Order: No  Pain: No pain    Therapy/Group: Individual  Therapy  Mollie Anger [facility] 03/14/2024, 3:04 PM

## 2024-03-14 NOTE — Progress Notes (Signed)
 NT assisted patient with breakfast (full supervision).  Randeen Busman, LPN

## 2024-03-15 DIAGNOSIS — I63511 Cerebral infarction due to unspecified occlusion or stenosis of right middle cerebral artery: Secondary | ICD-10-CM | POA: Diagnosis not present

## 2024-03-15 LAB — COMPREHENSIVE METABOLIC PANEL WITH GFR
ALT: 27 U/L (ref 0–44)
AST: 16 U/L (ref 15–41)
Albumin: 3.2 g/dL — ABNORMAL LOW (ref 3.5–5.0)
Alkaline Phosphatase: 51 U/L (ref 38–126)
Anion gap: 7 (ref 5–15)
BUN: 14 mg/dL (ref 6–20)
CO2: 25 mmol/L (ref 22–32)
Calcium: 8.5 mg/dL — ABNORMAL LOW (ref 8.9–10.3)
Chloride: 109 mmol/L (ref 98–111)
Creatinine, Ser: 0.76 mg/dL (ref 0.44–1.00)
GFR, Estimated: >60 mL/min (ref >60)
Glucose, Bld: 87 mg/dL (ref 70–99)
Potassium: 3.9 mmol/L (ref 3.5–5.1)
Sodium: 141 mmol/L (ref 135–145)
Total Bilirubin: 0.6 mg/dL (ref 0.0–1.2)
Total Protein: 6 g/dL — ABNORMAL LOW (ref 6.5–8.1)

## 2024-03-15 LAB — CBC WITH DIFFERENTIAL/PLATELET
Abs Immature Granulocytes: 0.01 10*3/uL (ref 0.00–0.07)
Basophils Absolute: 0 10*3/uL (ref 0.0–0.1)
Basophils Relative: 0 %
Eosinophils Absolute: 0.1 10*3/uL (ref 0.0–0.5)
Eosinophils Relative: 2 %
HCT: 41.8 % (ref 36.0–46.0)
Hemoglobin: 13.7 g/dL (ref 12.0–15.0)
Immature Granulocytes: 0 %
Lymphocytes Relative: 36 %
Lymphs Abs: 1.6 10*3/uL (ref 0.7–4.0)
MCH: 28.7 pg (ref 26.0–34.0)
MCHC: 32.8 g/dL (ref 30.0–36.0)
MCV: 87.6 fL (ref 80.0–100.0)
Monocytes Absolute: 0.4 10*3/uL (ref 0.1–1.0)
Monocytes Relative: 8 %
Neutro Abs: 2.4 10*3/uL (ref 1.7–7.7)
Neutrophils Relative %: 54 %
Platelets: 158 10*3/uL (ref 150–400)
RBC: 4.77 MIL/uL (ref 3.87–5.11)
RDW: 13.1 % (ref 11.5–15.5)
WBC: 4.6 10*3/uL (ref 4.0–10.5)
nRBC: 0 % (ref 0.0–0.2)

## 2024-03-15 NOTE — Progress Notes (Signed)
 Speech Language Pathology Daily Session Note  Patient Details  Name: Theresa Watkins MRN: [id number] Date of Birth: 24-Oct-1966  Today's Date: 03/15/2024 SLP Individual Time: 2440-1027; 2536-644 SLP Individual Time Calculation (min): 33 min; 43 min  Short Term Goals: Week 5: SLP Short Term Goal 1 (Week 5): Patient will increase speech intelligibility to 90% at the sentence level given min multimodal A  Skilled Therapeutic Interventions:  Session 1: Patient was seen in am to address speech intelligibility and dysphagia management. Pt was alert and seated upright in bed taking morning meds. Noted pt was taking meds with liquid as opposed to in puree as previously recommended. Pt taking meds one at a time requiring copius amounts of water per bolus. Despite need for large amounts of water pt able to take all meds without s/s aspiration. Pt reports indifference for meds in puree vs with liquid. SLP addressing speech intelligibility this date as pt able to verbalize 3/4 strategies indep. SLP reviewed remaining strategy and example of utilization. SLP engaged pt in spontaneous conversational exchange. Fluctuations in speech intelligibility observed as emotions rised with SLP drawing pt attention to change in speech quality. Given awareness, pt demo improved use of strategy during heightened emotion moments. Pt left in bed with call button within reach. SLP to continue POC.   Session 2: Patient was seen in PM to address speech intelligibility. Pt was alert and seen at bedside. SLP guided pt in completion of 5 sets of 5 EMST set at 25cm H2O. She then completed IOPI for anterior and posterior strengthening. Pt completed 1 set of 10 posteriorly set at 20kPa and anteriorly at 25 kPa. In other minutes of session SLP addressed speech intelligibility through guided conversation. Pt ~80-90% intelligible in conversation. Pt left at bedside with call button within reach and bed alarm active. SLP to continue POC.    Pain Pain Assessment Pain Scale: 0-10 Pain Score: 0-No pain Pain Location: Hand  Therapy/Group: Individual Therapy  Adela Holter 03/15/2024, 11:20 AM

## 2024-03-15 NOTE — Progress Notes (Signed)
 Occupational Therapy Session Note  Patient Details  Name: Theresa Watkins MRN: [id number] Date of Birth: 02-02-1966  Today's Date: 03/15/2024 OT Individual Time: 4098-1191 OT Individual Time Calculation (min): 15 min  and Today's Date: 03/15/2024 OT Missed Time: 15 Minutes Missed Time Reason: Patient fatigue;Patient unwilling/refused to participate without medical reason   Short Term Goals: Week 5:  OT Short Term Goal 1 (Week 5): Pt will complete LB dressing tasks with mod A OT Short Term Goal 2 (Week 5): Pt will perform UB dressing tasks with min A OT Short Term Goal 3 (Week 5): Pt will perfrom toileting tasks with min A  Skilled Therapeutic Interventions/Progress Updates:      Therapy Documentation Precautions:  Precautions Precautions: Fall Precaution/Restrictions Comments: L hemi Restrictions Weight Bearing Restrictions Per Provider Order: No General: "I just don't feel well" Pt supine in bed upon OT arrival, agreeable to positioning for decreased pain and increased comfort.  Pain:  9/10 pain reported in Lt hand, splinting, positioning, activity, intermittent rest breaks, distractions provided for pain management, pt reports tolerable to proceed.   Other Treatments: Pt reporting increased pain and swelling in Lt hand, nsg and MD already aware. OT applied Voltarin gel with nsg approval for decreased pain and donned wrist cock up splint for positioning. OT educating pt on elevated positioning for decreased edema and pain management while in bed. Pt with carryover although limited follow through of education.   Pt supine in bed with bed alarm activated, 2 bed rails up, call light within reach and 4Ps assessed.   Therapy/Group: Individual Therapy  Nila Barth, OTD, OTR/L 03/15/2024, 12:43 PM

## 2024-03-15 NOTE — Progress Notes (Signed)
 Physical Therapy Session Note  Patient Details  Name: Theresa Watkins MRN: [id number] Date of Birth: [birth year]  Today's Date: 03/15/2024 PT Individual Time: 1012-1021 PT Individual Time Calculation (min): 9 min   Short Term Goals: Week 5:  PT Short Term Goal 1 (Week 5): =LTGs d/t ELOS  Skilled Therapeutic Interventions/Progress Updates:    Pt recd in bed reporting LUE pain preventing her from participating in therapy. Premedicated. Therapist assisted with positioning of LUE for pain and edema management, and provided education on using sponge provided by OT to increase muscle pump action via active motion. Discussed pt's progress and gait training on previous day, with plan to continue in the same manner tomorrow. Pt expressed understanding. Pt declines all further intervention at this time.  Therapy Documentation Precautions:  Precautions Precautions: Fall Precaution/Restrictions Comments: L hemi Restrictions Weight Bearing Restrictions Per Provider Order: No General:    Therapy/Group: Individual Therapy  Tex Filbert 03/15/2024, 12:48 PM

## 2024-03-15 NOTE — Progress Notes (Signed)
 PROGRESS NOTE   Subjective/Complaints:  Pt reports bad pain in L hand- swells sometimes- tries to keep elevated, but knows doesn't always- more swollen this AM, but has 'been worse". Before.  L hand "killing her".  Fingers hurt- I think it's due to swelling Splint helps her protect it, but not sure it helps pain like she told me last week.   Wrapping helps pain and swelling.  LBM 2 days ago  ROS:   Pt denies SOB, abd pain, CP, N/V/C/D, and  no new vision changes   Objective:   No results found.    Recent Labs    03/15/24 0528  WBC 4.6  HGB 13.7  HCT 41.8  PLT 158       Recent Labs    03/15/24 0528  NA 141  K 3.9  CL 109  CO2 25  GLUCOSE 87  BUN 14  CREATININE 0.76  CALCIUM  8.5*        Intake/Output Summary (Last 24 hours) at 03/15/2024 6045 Last data filed at 03/15/2024 0803 Gross per 24 hour  Intake 832 ml  Output --  Net 832 ml         Physical Exam: Vital Signs Blood pressure 125/76, pulse 65, temperature 98 F (36.7 C), temperature source Oral, resp. rate 16, height 5\' 6"  (1.676 m), weight 64.4 kg, SpO2 95%.    General: awake, alert, appropriate, supine in bed; NAD HENT: conjugate gaze; oropharynx moist CV: regular rate and rhythm; no JVD Pulmonary: CTA B/L; no W/R/R- good air movement GI: soft, NT, ND, (+)BS Psychiatric: appropriate but frustrated about dysarthria Neurological: dysarthria- easier to understand Extremities: L hand moderately swollen worse on median side, not as much on ulnar side- fingers also swollen  Motor strength 0/5 LUE 3-/5 LLE 5/5 on right side   Dependent edema left hand and wrist.  No hypersensitivity to touch.  No temperature change. -sensory exam normal. Prior neuro assessment is c/w today's exam 03/15/2024.  Skin- L PIP of 2nd digit- has scab     Musculoskeletal:  No tenderness over the left wrist.   Assessment/Plan: 1. Functional deficits  which require 3+ hours per day of interdisciplinary therapy in a comprehensive inpatient rehab setting. Physiatrist is providing close team supervision and 24 hour management of active medical problems listed below. Physiatrist and rehab team continue to assess barriers to discharge/monitor patient progress toward functional and medical goals  Care Tool:  Bathing    Body parts bathed by patient: Left arm, Chest, Abdomen, Front perineal area, Right upper leg, Left upper leg, Face   Body parts bathed by helper: Right arm, Right lower leg, Left lower leg     Bathing assist Assist Level: Minimal Assistance - Patient > 75%     Upper Body Dressing/Undressing Upper body dressing   What is the patient wearing?: Pull over shirt    Upper body assist Assist Level: Minimal Assistance - Patient > 75%    Lower Body Dressing/Undressing Lower body dressing      What is the patient wearing?: Pants, Underwear/pull up     Lower body assist Assist for lower body dressing: Minimal Assistance - Patient > 75%  Toileting Toileting    Toileting assist Assist for toileting: Moderate Assistance - Patient 50 - 74%     Transfers Chair/bed transfer  Transfers assist     Chair/bed transfer assist level: Minimal Assistance - Patient > 75%     Locomotion Ambulation   Ambulation assist      Assist level: Minimal Assistance - Patient > 75% Assistive device: Walker-Eva Max distance: 150   Walk 10 feet activity   Assist     Assist level: Moderate Assistance - Patient - 50 - 74% Assistive device: Hand held assist   Walk 50 feet activity   Assist Walk 50 feet with 2 turns activity did not occur: Safety/medical concerns (L hemiplegia and pt fatigue)  Assist level: Moderate Assistance - Patient - 50 - 74% Assistive device: Hand held assist    Walk 150 feet activity   Assist Walk 150 feet activity did not occur: Safety/medical concerns (L hemiplegia and pt fatigue)  Assist  level: Minimal Assistance - Patient > 75% Assistive device: Walker-Eva    Walk 10 feet on uneven surface  activity   Assist Walk 10 feet on uneven surfaces activity did not occur: Safety/medical concerns (L hemiplegia and pt fatigue)         Wheelchair     Assist Is the patient using a wheelchair?: Yes Type of Wheelchair: Manual    Wheelchair assist level: Supervision/Verbal cueing Max wheelchair distance: 150    Wheelchair 50 feet with 2 turns activity    Assist        Assist Level: Supervision/Verbal cueing   Wheelchair 150 feet activity     Assist      Assist Level: Minimal Assistance - Patient > 75%   Blood pressure 125/76, pulse 65, temperature 98 F (36.7 C), temperature source Oral, resp. rate 16, height 5\' 6"  (1.676 m), weight 64.4 kg, SpO2 95%.   Medical Problem List and Plan: 1. 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 Search for facility ongoing. May only be able to go to custodial care facility  D/c SNF- hopefully by end of week/early next week  Con't CIR PT, OT and SLP P2P denied- appealed by family.  2. DVT/anticoagulation:  Pharmaceutical: Lovenox              -antiplatelet therapy: Aspirin  81 mg daily and Plavix  75 mg daily x 3 weeks then Plavix  alone 3. Pain Management: Tylenol  as needed  3/25- denies pain- con't regimen rpn   3-30: Ongoing left hip pain; Voltaren  gel and Tylenol .    4/3- having new burning pain in LUE- which is seen sometimes with stroke- will add Duloxetine  30 mg daily for nerve pain- monitor for side effects.  -if gets nausea, will change to Lyrica 4/4-6 no side effects so far-  4/8- will increase Duloxetine  to 60 mg daily  DC hydrocodone  due to  side effects, trial tyl #3  as needed for pain that is not relieved by Tylenol . 4/28- advised pt to use voltaren  gel  on all sides of L hand including between fingers- use tylenol  #3 as needed 4. Depression/Behavior/Sleep: Provide emotional support  4/21 mood improved--continue duloxetine              -  antipsychotic agents: N/A 5. Neuropsych/cognition: This patient is capable of making decisions on her own behalf. 6. Skin/Wound Care: Routine skin checks 7. Fluids/Electrolytes/Nutrition:encouraging po.   4/21- I personally reviewed the patient's labs today.     -albumin still a bit low--encourage po/proteins. We have discussed improving intake 8.  Dysphagia.  Dysphagia 3 with thins currently  4/18 intake ok    9.  Hyperlipidemia.  Crestor  10.  Recent History of cocaine/tobacco use.  UDS positive for cocaine.  Provide counseling, this has been a chronic issue , seen in PCP notes 11.  Hypothyroidism. continue Synthroid   12.  Constipation.   Senokot-S 2 tab twice daily  4/23- LBM yesterday  4/24- LBM 2 days ago but feels like needs to go this AM  4/25- LBM yesterday 15. Blurry vision and dysconjugate gaze  3/27- will need Ophtho after d/c.   3-29: Seems to mostly be from the left eye, with difficulty converging and tracking; messaged OT and PT to trial taping versus alternating eye patch to help with convergence.  3-30: Reporting improvement with use of taping  4/4- pt reports everything blurry- cannot see faces as a result- never had eye checked in past- used readers but cannot see at a distance since stroke- will send to Ophtho after d/c.   4/7- d/w pt again- she wants eyeglasses- explained we cannot get when in hospital- also per guidelines, since vision can still change, to not get for 3 months after stroke. She disbelieves it's from stroke, since had 2 prior- that didn't cause vision changes- we educated pt pn how this can occur.   4/25- reminded pt cannot get Optho in hospital- need to get  once d/c'd 16.  Left hip pain.  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 Severe HA-r  4/15-16 sx appear improved with topamax , tylenol  helps also   -may use sx to get out of therapy at times?  4/23- Per team, pt having HA's frequently which impairs therapy- she won't discuss HA's with me- unclear if only has a day goes on vs self limting behavior  4/24- increase to 75 mg at bedtime  4/25- still having HA's, actually thinks a little worse since not taking Norco for it-   4/28- fewer complaints about HA this AM 18. Decreased safety awareness/impulsiveness  4/22- still requiring max cues 20. Spasticity   4/22- off baclofen , and c/o LUE pain- wondering how much of her complaints are due to tone?-  21. 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 .    22.  Urinary  and bowel incont due to CVA, cont toileting program 4/23- still cannot control B/B   23. 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    I spent a total of 36    minutes on total care today- >50% coordination of care- due to  D/w pt about swelling, dysarthria, pain in L hand- and ways to treat- since doesn't have good result with Norco, was stopped- has tylenol  #3     LOS: 35 days A FACE TO  FACE EVALUATION WAS PERFORMED  Xiara Knisley 03/15/2024, 8:33 AM

## 2024-03-15 NOTE — Progress Notes (Signed)
 NT assisted patient with breakfast (full supervision).  Randeen Busman, LPN

## 2024-03-15 NOTE — Plan of Care (Signed)
  Problem: Consults Goal: RH STROKE PATIENT EDUCATION Description: See Patient Education module for education specifics  Outcome: Progressing   Problem: RH BOWEL ELIMINATION Goal: RH STG MANAGE BOWEL WITH ASSISTANCE Description: STG Manage Bowel with toileting Assistance. Outcome: Progressing Goal: RH STG MANAGE BOWEL W/MEDICATION W/ASSISTANCE Description: STG Manage Bowel with Medication with mod I Assistance. Outcome: Progressing   Problem: RH SAFETY Goal: RH STG ADHERE TO SAFETY PRECAUTIONS W/ASSISTANCE/DEVICE Description: STG Adhere to Safety Precautions With cues Assistance/Device. Outcome: Progressing   Problem: RH PAIN MANAGEMENT Goal: RH STG PAIN MANAGED AT OR BELOW PT'S PAIN GOAL Description: Pain managed < 4 with prns Outcome: Progressing   Problem: RH KNOWLEDGE DEFICIT Goal: RH STG INCREASE KNOWLEDGE OF HYPERTENSION Description: Patient and mother will be able to manage HTN using educational resources for medications and dietary modification independently Outcome: Progressing Goal: RH STG INCREASE KNOWLEDGE OF DYSPHAGIA/FLUID INTAKE Description: Patient and mother will be able to manage dysphagia using educational resources for medications and dietary modification independently Outcome: Progressing Goal: RH STG INCREASE KNOWLEGDE OF HYPERLIPIDEMIA Description: Patient and mother will be able to manage HLD using educational resources for medications and dietary modification independently Outcome: Progressing Goal: RH STG INCREASE KNOWLEDGE OF STROKE PROPHYLAXIS Description: Patient and mother will be able to manage secondary risks using educational resources for medications and dietary modification independently Outcome: Progressing   Problem: RH BOWEL ELIMINATION Goal: RH STG MANAGE BOWEL WITH ASSISTANCE Description: STG Manage Bowel with Assistance. Outcome: Progressing Goal: RH STG MANAGE BOWEL W/MEDICATION W/ASSISTANCE Description: STG Manage Bowel with  Medication with Assistance. Outcome: Progressing   Problem: RH SKIN INTEGRITY Goal: RH STG SKIN FREE OF INFECTION/BREAKDOWN Outcome: Progressing Goal: RH STG MAINTAIN SKIN INTEGRITY WITH ASSISTANCE Description: STG Maintain Skin Integrity With Assistance. Outcome: Progressing Goal: RH STG ABLE TO PERFORM INCISION/WOUND CARE W/ASSISTANCE Description: STG Able To Perform Incision/Wound Care With Assistance. Outcome: Progressing

## 2024-03-15 NOTE — Progress Notes (Signed)
 NT assisted patient with lunch (full supervision).   Randeen Busman, LPN

## 2024-03-15 NOTE — Progress Notes (Signed)
 Patient ID: Theresa Watkins, female   DOB: August 14, 1966, 58 y.o.   MRN: [id number]  1024- SW spoke with Jearlean Mince with [employer] [place] [employer] to check appeal status. Reports currently in processing.   Norval Been, MSW, LCSW Office: [phone] Cell: [phone] Fax: [phone]

## 2024-03-16 ENCOUNTER — Inpatient Hospital Stay (HOSPITAL_COMMUNITY)

## 2024-03-16 ENCOUNTER — Telehealth: Payer: Self-pay | Admitting: Internal Medicine

## 2024-03-16 DIAGNOSIS — I63511 Cerebral infarction due to unspecified occlusion or stenosis of right middle cerebral artery: Secondary | ICD-10-CM | POA: Diagnosis not present

## 2024-03-16 DIAGNOSIS — M7989 Other specified soft tissue disorders: Secondary | ICD-10-CM | POA: Diagnosis not present

## 2024-03-16 MED ORDER — TOPIRAMATE 25 MG PO TABS: 75.0000 mg | ORAL_TABLET | ORAL | Status: DC | PRN

## 2024-03-16 NOTE — Progress Notes (Signed)
 NT assisted patient with dinner(full supervision).   Randeen Busman, LPn

## 2024-03-16 NOTE — Progress Notes (Signed)
 Patient ID: Theresa Watkins, female   DOB: [birth year], 58 y.o.   MRN: [id number]  SW spoke with pt to provide updates from team conference, and current issues surrounding her insurance and denial for CIR, and working on appeal. She is aware of challenges with trying to find placement and getting approval for CIR. She was informed there will be updates once more information.  Norval Been, MSW, LCSW Office: [phone] Cell: [phone] Fax: [phone]

## 2024-03-16 NOTE — Telephone Encounter (Addendum)
 Copied from CRM [id number]. Topic: General - Other  >> Mar 16, 2024  4:33 PM Elle L wrote: Reason for CRM: The patient's Mother, Dorian Cutts, is requesting to speak to Dr. Lincoln Renshaw regarding the patient being in the hospital since March due to a stroke. Her call back number is [phone].

## 2024-03-16 NOTE — Plan of Care (Signed)
  Problem: Consults Goal: RH STROKE PATIENT EDUCATION Description: See Patient Education module for education specifics  Outcome: Progressing   Problem: RH BOWEL ELIMINATION Goal: RH STG MANAGE BOWEL WITH ASSISTANCE Description: STG Manage Bowel with toileting Assistance. Outcome: Progressing   Problem: RH SAFETY Goal: RH STG ADHERE TO SAFETY PRECAUTIONS W/ASSISTANCE/DEVICE Description: STG Adhere to Safety Precautions With cues Assistance/Device. Outcome: Progressing   Problem: RH PAIN MANAGEMENT Goal: RH STG PAIN MANAGED AT OR BELOW PT'S PAIN GOAL Description: Pain managed < 4 with prns Outcome: Progressing   Problem: RH KNOWLEDGE DEFICIT Goal: RH STG INCREASE KNOWLEDGE OF HYPERTENSION Description: Patient and mother will be able to manage HTN using educational resources for medications and dietary modification independently Outcome: Progressing Goal: RH STG INCREASE KNOWLEDGE OF DYSPHAGIA/FLUID INTAKE Description: Patient and mother will be able to manage dysphagia using educational resources for medications and dietary modification independently Outcome: Progressing

## 2024-03-16 NOTE — Progress Notes (Signed)
 Notified PA of upper extremity venous duplex results.   Randeen Busman, LPN

## 2024-03-16 NOTE — Progress Notes (Signed)
 NT assisted patient with dinner (full supervision).   Randeen Busman, LP

## 2024-03-16 NOTE — Progress Notes (Signed)
 NT assisted patient with lunch(full supervision).   Randeen Busman, LPN

## 2024-03-16 NOTE — Progress Notes (Signed)
 PROGRESS NOTE   Subjective/Complaints:  Pt reports voltaren  felt OK- did help pain some, but only used 2x- admitted doesn't like to take tyl #3 or use voltaren  gel because doesn't want to keep using meds.   LBM x3 yesterday Said elevated L hand all night, but took brace off at 5am.  L hand still painful   ROS:    Pt denies SOB, abd pain, CP, N/V/C/D, and no new vision changes  Objective:   No results found.    Recent Labs    03/15/24 0528  WBC 4.6  HGB 13.7  HCT 41.8  PLT 158       Recent Labs    03/15/24 0528  NA 141  K 3.9  CL 109  CO2 25  GLUCOSE 87  BUN 14  CREATININE 0.76  CALCIUM  8.5*        Intake/Output Summary (Last 24 hours) at 03/16/2024 0840 Last data filed at 03/15/2024 1851 Gross per 24 hour  Intake 436 ml  Output --  Net 436 ml         Physical Exam: Vital Signs Blood pressure (!) 112/94, pulse 70, temperature (!) 97.4 F (36.3 C), resp. rate 17, height 5\' 6"  (1.676 m), weight 64.4 kg, SpO2 95%.     General: awake, alert, appropriate, just woke up- sleepy; NAD HENT: conjugate gaze; oropharynx moist CV: regular rate and rhythm; no JVD Pulmonary: CTA B/L; no W/R/R- good air movement GI: soft, NT, ND, (+)BS Psychiatric: appropriate but sleepy Neurological: moderate dysarthria- starting to understand her better Extremities: L hand moderately swollen worse on median side, not as much on ulnar side- fingers also swollen- slightly worse today  Motor strength 0/5 LUE 3-/5 LLE 5/5 on right side   Dependent edema left hand and wrist.  No hypersensitivity to touch.  No temperature change. -sensory exam normal. Prior neuro assessment is c/w today's exam 03/16/2024.  Skin- L PIP of 2nd digit- has scab     Musculoskeletal:  No tenderness over the left wrist.   Assessment/Plan: 1. Functional deficits which require 3+ hours per day of interdisciplinary therapy in a  comprehensive inpatient rehab setting. Physiatrist is providing close team supervision and 24 hour management of active medical problems listed below. Physiatrist and rehab team continue to assess barriers to discharge/monitor patient progress toward functional and medical goals  Care Tool:  Bathing    Body parts bathed by patient: Left arm, Chest, Abdomen, Front perineal area, Right upper leg, Left upper leg, Face   Body parts bathed by helper: Right arm, Right lower leg, Left lower leg     Bathing assist Assist Level: Minimal Assistance - Patient > 75%     Upper Body Dressing/Undressing Upper body dressing   What is the patient wearing?: Pull over shirt    Upper body assist Assist Level: Minimal Assistance - Patient > 75%    Lower Body Dressing/Undressing Lower body dressing      What is the patient wearing?: Pants, Underwear/pull up     Lower body assist Assist for lower body dressing: Minimal Assistance - Patient > 75%     Toileting Toileting    Toileting assist Assist for  toileting: Moderate Assistance - Patient 50 - 74%     Transfers Chair/bed transfer  Transfers assist     Chair/bed transfer assist level: Minimal Assistance - Patient > 75%     Locomotion Ambulation   Ambulation assist      Assist level: Minimal Assistance - Patient > 75% Assistive device: Walker-Eva Max distance: 150   Walk 10 feet activity   Assist     Assist level: Moderate Assistance - Patient - 50 - 74% Assistive device: Hand held assist   Walk 50 feet activity   Assist Walk 50 feet with 2 turns activity did not occur: Safety/medical concerns (L hemiplegia and pt fatigue)  Assist level: Moderate Assistance - Patient - 50 - 74% Assistive device: Hand held assist    Walk 150 feet activity   Assist Walk 150 feet activity did not occur: Safety/medical concerns (L hemiplegia and pt fatigue)  Assist level: Minimal Assistance - Patient > 75% Assistive device:  Walker-Eva    Walk 10 feet on uneven surface  activity   Assist Walk 10 feet on uneven surfaces activity did not occur: Safety/medical concerns (L hemiplegia and pt fatigue)         Wheelchair     Assist Is the patient using a wheelchair?: Yes Type of Wheelchair: Manual    Wheelchair assist level: Supervision/Verbal cueing Max wheelchair distance: 150    Wheelchair 50 feet with 2 turns activity    Assist        Assist Level: Supervision/Verbal cueing   Wheelchair 150 feet activity     Assist      Assist Level: Minimal Assistance - Patient > 75%   Blood pressure (!) 112/94, pulse 70, temperature (!) 97.4 F (36.3 C), resp. rate 17, height 5\' 6"  (1.676 m), weight 64.4 kg, SpO2 95%.   Medical Problem List and Plan: 1. 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 Search for facility ongoing. May only be able to go to custodial care facility  D/c SNF- hopefully by end of week/early next week  Con't CIR PT, OT and SLP  Team conference today-  P2P denied- appealed by family.  2. DVT/anticoagulation:  Pharmaceutical: Lovenox              -antiplatelet therapy: Aspirin  81 mg daily and Plavix  75 mg daily x 3 weeks then Plavix  alone 3. Pain Management: Tylenol  as needed  3/25- denies pain- con't regimen rpn   3-30: Ongoing left hip pain; Voltaren  gel and Tylenol .    4/3- having new burning pain in LUE- which is seen sometimes with stroke- will add Duloxetine  30 mg daily for nerve pain- monitor for side effects.  -if gets nausea, will change to Lyrica 4/4-6 no side effects so far-  4/8- will increase Duloxetine  to 60 mg daily  DC hydrocodone  due to side effects, trial tyl #3  as needed for pain that  is not relieved by Tylenol . 4/28- advised pt to use voltaren  gel  on all sides of L hand including between fingers- use tylenol  #3 as needed 4/29- pt admits doesn't like taking/using meds all the time- explained I don't have a "fix"- to make it better-  have to take meds to have them work 4. Depression/Behavior/Sleep: Provide emotional support  4/21 mood improved--continue duloxetine              -antipsychotic agents: N/A 5. Neuropsych/cognition: This patient is capable of making decisions on her own behalf. 6. Skin/Wound Care: Routine skin checks 7. Fluids/Electrolytes/Nutrition:encouraging po.   4/21- I personally reviewed the patient's labs today.     -albumin still a bit low--encourage po/proteins. We have discussed improving intake 8.  Dysphagia.  Dysphagia 3 with thins currently  4/18 intake ok    9.  Hyperlipidemia.  Crestor  10.  Recent History of cocaine/tobacco use.  UDS positive for cocaine.  Provide counseling, this has been a chronic issue , seen in PCP notes 11.  Hypothyroidism. continue Synthroid   12.  Constipation.   Senokot-S 2 tab twice daily  4/23- LBM yesterday  4/24- LBM 2 days ago but feels like needs to go this AM  4/25- LBM yesterday  4/29- LBM yesterday 15. Blurry vision and dysconjugate gaze  3/27- will need Ophtho after d/c.   3-29: Seems to mostly be from the left eye, with difficulty converging and tracking; messaged OT and PT to trial taping versus alternating eye patch to help with convergence.  3-30: Reporting improvement with use of taping  4/4- pt reports everything blurry- cannot see faces as a result- never had eye checked in past- used readers but cannot see at a distance since stroke- will send to Ophtho after d/c.   4/7- d/w pt again- she wants eyeglasses- explained we cannot get when in hospital- also per guidelines, since vision can still change, to not get for 3 months after stroke. She disbelieves it's from stroke, since had 2 prior- that didn't  cause vision changes- we educated pt pn how this can occur.   4/25- reminded pt cannot get Optho in hospital- need to get once d/c'd 16.  Left hip pain.  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 Severe HA-daily HA's  4/15-16 sx appear improved with topamax , tylenol  helps also   -may use sx to get out of therapy at times?  4/23- Per team, pt having HA's frequently which impairs therapy- she won't discuss HA's with me- unclear if only has a day goes on vs self limting behavior  4/24- increase to 75 mg at bedtime  4/25- still having HA's, actually thinks a little worse since not taking Norco for it-   4/28-4/29 fewer complaints about HA this AM 18. Decreased safety awareness/impulsiveness  4/22- still requiring max cues 20. Spasticity  4/22- off baclofen , and c/o LUE pain- wondering how much of her complaints are due to tone?-  21. 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 .    22.  Urinary  and bowel incont due to CVA, cont toileting program 4/23- still cannot control B/B   23. 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   4/29- will get Dopplers, because pt says she elevated hand overnight- still swollen- ordered  I spent a total of 37    minutes on total care today- >50% coordination of care- due to  Calling radiology to get Doppler to order- wasn't available that could find- also d/w PA and reading doppler when comes back- also team conference today.     LOS: 36 days A FACE TO FACE EVALUATION WAS PERFORMED  Theresa Watkins 03/16/2024, 8:40 AM

## 2024-03-16 NOTE — Telephone Encounter (Signed)
 Called & spoke to Theresa Watkins, the patient's mother. Inquired for further clarification. Theresa declined and would like to speak to Dr.Johnson only. Please call Theresa at [phone].

## 2024-03-16 NOTE — Progress Notes (Signed)
 NT assisted patient with breakfast(full supervision).   Randeen Busman, LPn

## 2024-03-16 NOTE — Progress Notes (Signed)
 Speech Language Pathology Daily Session Note  Patient Details  Name: Theresa Watkins MRN: [id number] Date of Birth: 01-09-1966  Today's Date: 03/16/2024 SLP Individual Time: 0865-7846 SLP Individual Time Calculation (min): 44 min  Short Term Goals: Week 5: SLP Short Term Goal 1 (Week 5): Patient will increase speech intelligibility to 90% at the sentence level given min multimodal A  Skilled Therapeutic Interventions:   Patient was seen in PM to address speech intelligibility. Pt was easily alerted upon SLP arrival and agreeable for session. She reports pain in LUE. In response she reported doppler completed earlier in day and awaiting results. SLP guided pt in completion of respiratory training through use of EMST. Pt completed 5 sets of 5 at 25cm H2O and sup to min A for completion. In other minutes of session SLP attempted to guide pt in completion of more structured speech task however, pt reporting some mental exhaustion. SLP provided active listening and encouragement. During conversation, pt was receptive to cueing for increasing speech intelligibility along with SLP challenging pt to identify strategies to increase speech intelligibility. SLP providing education and instruction on comparing speech in a formal situation (I.e. job interview) vs informal situation (talking with friends). SLP encouraged pt to use strategies consistent with formal speech to improve speech intelligibility. Pt also challenged to identify speech strategies with pt able to identify proper positioning to maximize speech as well as use of short simple phrases in more imminent situations. Pt's speech fluctuating throughout session between 75% initially improving to 90% at the sentence level at conclusion of session. Direct handoff to PT at conclusion of session. SLP to continue POC.   Pain Pain Assessment Pain Scale: 0-10 Pain Score: 0-No pain  Therapy/Group: Individual Therapy  Adela Holter 03/16/2024, 1:42 PM

## 2024-03-16 NOTE — Progress Notes (Signed)
 Occupational Therapy Session Note  Patient Details  Name: Theresa Watkins MRN: [id number] Date of Birth: 06-Aug-1966  Today's Date: 03/16/2024 OT Individual Time: 4098-1191 OT Individual Time Calculation (min): 55 min    Short Term Goals: Week 5:  OT Short Term Goal 1 (Week 5): Pt will complete LB dressing tasks with mod A OT Short Term Goal 2 (Week 5): Pt will perform UB dressing tasks with min A OT Short Term Goal 3 (Week 5): Pt will perfrom toileting tasks with min A  Skilled Therapeutic Interventions/Progress Updates:    Pt resting in bed upon arrival. Pt reported increased pain in LUE/hand (MD aware) and declined OOB activities. OT intervention with focus on bed mobility, sitting balance, LUE PROM/AAROM/AROM to increase independence with BADLs. Emotional support/therapeutic listening in regards to LUE/hand pain. Supine>sit EOB with CGA. Sitting balance with close supervision and occasional CGA for safety. LUE shoulder/elbow PROM/AAROM for joint mobility and increased strength. Resistive strength exercises. Pt remained in bed with all needs within reach. Bed alarm activated. Pt agreeable to taking shower tomorrow during therapy session.   Therapy Documentation Precautions:  Precautions Precautions: Fall Precaution/Restrictions Comments: L hemi Restrictions Weight Bearing Restrictions Per Provider Order: No Pain: Pt c/o Lt hand pain; MD aware, gentle ROM/soft tissue massage. Pt reports increased tenderness.  Therapy/Group: Individual Therapy  [name] 03/16/2024, 10:36 AM

## 2024-03-16 NOTE — Patient Care Conference (Signed)
 Inpatient RehabilitationTeam Conference and Plan of Care Update Date: 03/16/2024   Time: 1109 am    Patient Name: Theresa Watkins      Medical Record Number: [id number]  Date of Birth: 1966-10-23 Sex: Female         Room/Bed: 4W01C/4W01C-01 Payor Info: Payor: [place] MEDICAID PREPAID HEALTH PLAN / Plan: [place] MEDICAID HEALTHY BLUE / Product Type: *No Product type* /    Admit Date/Time:  02/09/2024  6:29 PM  Primary Diagnosis:  Right middle cerebral artery stroke [facility])  Hospital Problems: Principal Problem:   Right middle cerebral artery stroke (HCC) Active Problems:   Polysubstance abuse (HCC)    Expected Discharge Date: Expected Discharge Date:  (pending)  Team Members Present: Physician leading conference: Dr. Celia Coles Social Worker Present: Norval Been, LCSWA Nurse Present: Jerene Monks, RN PT Present: Aundria Leech, PT OT Present: [name], OT;Jackqueline Mason, COTA SLP Present: Jenney Modest, SLP PPS Coordinator present : Jestine Moron, SLP     Current Status/Progress Goal Weekly Team Focus  Bowel/Bladder   Mixed Continent/incontinent   Regain full continence   Timed toileting and assess q shift/ PRN    Swallow/Nutrition/ Hydration   Dys 3 and thin   min A  carryover of comepnsatory strategies, IOPI and EMST    ADL's   bathing Min A, LB dressing Min A, toileting Mod A, UB dressing Min A with limited carryover of hemi technique, transfers Min A, VC for safety   min A overall; LB dressing downgraded to mod A; dynamic standing balance downgraded to min A; anticipatory awareness downgraded to mod A   ADL retraining, safety during ADLs/transfers    Mobility   supervision bed mobility, CGA transfers with good set up, min a for harder transfers, gait training with eva walker 200 ft   supervision bed<>chair, min a short distance gait, mod i w/c  gait, transfers, participation    Communication   mixed dysarthria ranging from 80-90% intelligibility   min A    carryover of comepnsatory strategies, IOPI and EMST    Safety/Cognition/ Behavioral Observations               Pain   Patient c/o migraine headache but refuses to take her Topamax , prefers Tylenol    Patient will be pain free   Assess q shift and PRN    Skin   Skin is intact, has a scab on her left index finger knuckle   Patient will maintain skin integrity  Assess q shift and PRN      Discharge Planning:  Pt is SNF placement. Discharge pending extension in CIR, and SNF bed offer and insurance approval.  Limited SNF options due to insurnace.  SW will confirm there are no barriers to discharge.    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 Patient limited with left wrist pain/swelling, headaches: medications adjusted by MD. Patient limited by  impulsitivity, needing max cues with gait training, poor carry over , poor safety awareness and mixed dysarthria.   Patient on target to meet rehab goals: No, Patient requires Mod A with UB care,minimal assistance with lower body care. Patient requires min a for harder transfers. Patient  able to ambulate up to 200 ftt with minimal assistance using the EVA walker. Goals for discharge were downgraded to Mod  I assistance.   *See Care Plan and progress notes for long and short-term goals.   Revisions to Treatment Plan:  Downgraded  goals IOPI Dopplers for DVT EVA walker  EMST device  Teaching Needs: Safety, medications, transfers, toileting, etc    Current Barriers to Discharge: Decreased caregiver support, Incontinence, and Behavior  Possible Resolutions to Barriers: Family Education  SNF     Medical Summary Current Status: continent during day not at night- L hand swelling and pain- not using LUE much due to pain and protective of LUE  Barriers to Discharge: Behavior/Mood;Other (comments);Medical stability;Spasticity;Uncontrolled  Pain;Incontinence;Self-care education  Barriers to Discharge Comments: L hand swelling and pain- poor safety awareness- really impairs therapy and nursing- ; nightly incontinence; dysarthria- not using SLP device out of therapy- swallow still doesn't look great-  coughs with eating- Possible Resolutions to [employer] Focus: dopplers for checking for DVT; working on [employer] walker- voltaren  gel and tylenol  #3 for LUE pain- reduced Norco to tylenol  #3 due to loopiness- slightly improving insight- D/C to SNF vs APS getting involved   Continued Need for Acute Rehabilitation Level of Care: The patient requires daily medical management by a physician with specialized training in physical medicine and rehabilitation for the following reasons: Direction of a multidisciplinary physical rehabilitation program to maximize functional independence : Yes Medical management of patient stability for increased activity during participation in an intensive rehabilitation regime.: Yes Analysis of laboratory values and/or radiology reports with any subsequent need for medication adjustment and/or medical intervention. : Yes   I attest that I was present, lead the team conference, and concur with the assessment and plan of the team.   Jerene Monks 03/16/2024, 1109 am

## 2024-03-16 NOTE — Plan of Care (Signed)
  Problem: Consults Goal: RH STROKE PATIENT EDUCATION Description: See Patient Education module for education specifics  Outcome: Progressing   Problem: RH BOWEL ELIMINATION Goal: RH STG MANAGE BOWEL WITH ASSISTANCE Description: STG Manage Bowel with toileting Assistance. Outcome: Progressing Goal: RH STG MANAGE BOWEL W/MEDICATION W/ASSISTANCE Description: STG Manage Bowel with Medication with mod I Assistance. Outcome: Progressing   Problem: RH SAFETY Goal: RH STG ADHERE TO SAFETY PRECAUTIONS W/ASSISTANCE/DEVICE Description: STG Adhere to Safety Precautions With cues Assistance/Device. Outcome: Progressing   Problem: RH PAIN MANAGEMENT Goal: RH STG PAIN MANAGED AT OR BELOW PT'S PAIN GOAL Description: Pain managed < 4 with prns Outcome: Progressing   Problem: RH KNOWLEDGE DEFICIT Goal: RH STG INCREASE KNOWLEDGE OF HYPERTENSION Description: Patient and mother will be able to manage HTN using educational resources for medications and dietary modification independently Outcome: Progressing Goal: RH STG INCREASE KNOWLEDGE OF DYSPHAGIA/FLUID INTAKE Description: Patient and mother will be able to manage dysphagia using educational resources for medications and dietary modification independently Outcome: Progressing Goal: RH STG INCREASE KNOWLEGDE OF HYPERLIPIDEMIA Description: Patient and mother will be able to manage HLD using educational resources for medications and dietary modification independently Outcome: Progressing Goal: RH STG INCREASE KNOWLEDGE OF STROKE PROPHYLAXIS Description: Patient and mother will be able to manage secondary risks using educational resources for medications and dietary modification independently Outcome: Progressing

## 2024-03-16 NOTE — Progress Notes (Signed)
 Scab to left index fingers is no longer present. Skin intact.  Randeen Busman, LPN

## 2024-03-16 NOTE — Progress Notes (Signed)
 Physical Therapy Session Note  Patient Details  Name: Theresa Watkins MRN: [id number] Date of Birth: Oct 26, 1966  Today's Date: 03/16/2024 PT Individual Time: 1345-1445 PT Individual Time Calculation (min): 60 min   Short Term Goals: Week 5:  PT Short Term Goal 1 (Week 5): =LTGs d/t ELOS  Skilled Therapeutic Interventions/Progress Updates:    pt received in bed and agreeable to therapy. Pt reports continued increased RUE pain, declines intervention at this time. Donned wrist splint for comfort. Pt assisted with donning brief, pants,  and changing shirt.   Modified Stand pivot transfer using bed rail in RUE x 2, pt set up for ideal success and able to perform with light CGA.   Pt transported to therapy gym for time management and energy conservation. Session focused on gait training with eva walker, 3 x 174ft. Pt able to take a few steps at near [facility] level with support of eva walker but quickly fades to min a overall. By last bout, pt required mod a for gait and max a to safely complete turn to sit. Noted poor foot clearance that pt was partially able to correct with cueing and LLE maintained in knee flexion. Intermittent toe catch requiring increased assist to maintain stability.   Pt then propelled w/c with BLE for increased hamstring and hip flexor activation. Pt returned to bed after session and was left with all needs in reach and alarm active.   Therapy Documentation Precautions:  Precautions Precautions: Fall Precaution/Restrictions Comments: L hemi Restrictions Weight Bearing Restrictions Per Provider Order: No General:       Therapy/Group: Individual Therapy  Tex Filbert 03/16/2024, 3:27 PM

## 2024-03-16 NOTE — Progress Notes (Signed)
 Upper extremity venous duplex completed. Please see CV Procedures for preliminary results.  Estanislao Heimlich, RVT 03/16/24 11:10 AM

## 2024-03-17 DIAGNOSIS — I63511 Cerebral infarction due to unspecified occlusion or stenosis of right middle cerebral artery: Secondary | ICD-10-CM | POA: Diagnosis not present

## 2024-03-17 NOTE — Progress Notes (Addendum)
 Patient ID: Theresa Watkins, female   DOB: Dec 10, 1965, 58 y.o.   MRN: [id number]  1039- SW spoke with Henry Loge with CM Department of [place] [employer] to check appeal status for Requisition [id number].  Reports currently in processing. In further discussion, she states this is a standard appeal and can take up to 30 days to process. SW explained concerns about not being notified on 30 day for appeal decision and pt continuing to accrue a bill considering she does not have a safe discharge plan, and requested an expedited appeal. She states either herself or Silverio Drought will follow-up with updates on next steps.   Norval Been, MSW, LCSW Office: [phone] Cell: [phone] Fax: [phone]

## 2024-03-17 NOTE — Progress Notes (Signed)
 Occupational Therapy Session Note  Patient Details  Name: Theresa Watkins MRN: [id number] Date of Birth: [birth year]  Today's Date: 03/17/2024 OT Individual Time: 0930-1040 OT Individual Time Calculation (min): 70 min    Short Term Goals: Week 5:  OT Short Term Goal 1 (Week 5): Pt will complete LB dressing tasks with mod A OT Short Term Goal 2 (Week 5): Pt will perform UB dressing tasks with min A OT Short Term Goal 3 (Week 5): Pt will perfrom toileting tasks with min A  Skilled Therapeutic Interventions/Progress Updates:    OT intervention with focus on bed mobility, sitting balance, bathing at shower level, dressing with sit<>stand from EOB, standing balance, LUE functional use, and safety awareness to increase independence with BADLs. Supine>sit EOB with supervision using bed functions. Stedy utilized for [employer] to [employer] in shower for time mgmt. Bathing with min A and max verbal cues for safety awareness. Sitting balance EOB for dressing with supervisoin/CGA and max verbal cues for safety awareness. Pt initited hemi dressing techniques this morning with min verbal cues. Pt initiating use of LUE to assist with functional tasks. Sit<>stand from EOB to pull pants up with min A. Assist provided to pull pants over hips but pt able to thread BLE into underpants and pants without assistance. Pt requested to return to bed at end of sesion. Sit>supine with supervisoin. Pt remained in bed with all needs within reach. Bed alarm activated.   Therapy Documentation Precautions:  Precautions Precautions: Fall Precaution/Restrictions Comments: L hemi Restrictions Weight Bearing Restrictions Per Provider Order: No   Pain:  Pt reports Lt hand tenderness (edema noted); MD aware and Isotoner glove ordered   Therapy/Group: Individual Therapy  [name] 03/17/2024, 10:44 AM

## 2024-03-17 NOTE — Progress Notes (Signed)
 Speech Language Pathology Weekly Progress and Session Note  Patient Details  Name: Theresa Watkins MRN: [id number] Date of Birth: May 05, 1966  Beginning of progress report period: March 09, 2024 End of progress report period: March 17, 2024  Today's Date: 03/17/2024 SLP Individual Time: 7829-5621 SLP Individual Time Calculation (min): 47 min  Short Term Goals: Week 5: SLP Short Term Goal 1 (Week 5): Patient will increase speech intelligibility to 90% at the sentence level given min multimodal A SLP Short Term Goal 1 - Progress (Week 5): Met  New Short Term Goals: Week 6: SLP Short Term Goal 1 (Week 6): STGs= LTGs due to ELOS  Weekly Progress Updates: Patient made steady gains meeting 1 of 1 STGs this reporting period.Pt requires supervision level of assist during meals for use of safe swallowing strategies. Speech intelligibility fluctuates between 70-90% to familiar and unfamiliar listeners. Pt/ family education ongoing. Pt would benefit from continued skilled SLP intervention to maximize swallowing and speech in order to maximize her functional independence prior to discharge.  Intensity: Minumum of 1-2 x/day, 30 to 90 minutes Frequency: 1 to 3 out of 7 days;3 to 5 out of 7 days Duration/Length of Stay: 4/25 Treatment/Interventions: Multimodal communication approach;Speech/Language facilitation;Functional tasks;Therapeutic Activities;Therapeutic Exercise;Internal/external aids;Dysphagia/aspiration precaution training;Patient/family education  Daily Session  Skilled Therapeutic Interventions: Patient was seen in am to address speech intelligibility. SLP encouraging pt to transfer to [facility] in order to practice carryover of speech intelligibility strategies with unfamiliar listeners. Pt declined this date stating, "It's too early." Pt agreeable to complete session in her room. SLP guided pt in completion of respiratory training through use of EMST. Pt completed 5 sets of 5 at increased  intensity of 30cm H2O and sup A for completion. Pt also challenged in completion of IOPI for lingual strengthening. She completed 10 reps at 20 kPA posteriorly and 25 kPa anteriorly. Throughout session SLP educating pt on importance of upright positioning for speech and swallowing. Pt observed with reclined position requiring frequent repositioning throughout session. SLP also assisted pt by providing set up A during oral hygiene. In other minutes of session, SLP addressed speech intelligibility through a guided conversational exchange. Speech intelligibility fluctuating between 60- 90% with lower intelligibility noted as pt was laughing and taking additional time before returning to baseline. Instructed pt in being mindful to changes in intelligibility during moments of heightened emotion and possible need to repeat message. Pt left upright in bed with call button within reach and bed alarm active. SLP to continue POC.   General    Pain Pain Assessment Pain Scale: 0-10 Pain Score: 6  Pain Type: Acute pain Pain Location: Arm Pain Orientation: Left Pain Descriptors / Indicators: Aching Pain Intervention(s): Repositioned;Distraction  Therapy/Group: Individual Therapy  Adela Holter 03/17/2024, 11:47 AM

## 2024-03-17 NOTE — Telephone Encounter (Signed)
 see message from pt's mother. I have never met or spoken to her. Please see what she wants/needs.

## 2024-03-17 NOTE — Plan of Care (Signed)
  Problem: RH BOWEL ELIMINATION Goal: RH STG MANAGE BOWEL WITH ASSISTANCE Description: STG Manage Bowel with toileting Assistance. Outcome: Progressing Goal: RH STG MANAGE BOWEL W/MEDICATION W/ASSISTANCE Description: STG Manage Bowel with Medication with mod I Assistance. Outcome: Progressing   Problem: RH SAFETY Goal: RH STG ADHERE TO SAFETY PRECAUTIONS W/ASSISTANCE/DEVICE Description: STG Adhere to Safety Precautions With cues Assistance/Device. Outcome: Progressing   Problem: RH PAIN MANAGEMENT Goal: RH STG PAIN MANAGED AT OR BELOW PT'S PAIN GOAL Description: Pain managed < 4 with prns Outcome: Progressing   Problem: RH KNOWLEDGE DEFICIT Goal: RH STG INCREASE KNOWLEDGE OF HYPERTENSION Description: Patient and mother will be able to manage HTN using educational resources for medications and dietary modification independently Outcome: Progressing Goal: RH STG INCREASE KNOWLEDGE OF DYSPHAGIA/FLUID INTAKE Description: Patient and mother will be able to manage dysphagia using educational resources for medications and dietary modification independently Outcome: Progressing Goal: RH STG INCREASE KNOWLEGDE OF HYPERLIPIDEMIA Description: Patient and mother will be able to manage HLD using educational resources for medications and dietary modification independently Outcome: Progressing Goal: RH STG INCREASE KNOWLEDGE OF STROKE PROPHYLAXIS Description: Patient and mother will be able to manage secondary risks using educational resources for medications and dietary modification independently Outcome: Progressing

## 2024-03-17 NOTE — Progress Notes (Signed)
 Orthopedic Tech Progress Note Patient Details:  Theresa Watkins 1966/03/23 [id number] Isotoner compression gloves have been ordered from [facility]  Patient ID: Theresa Watkins, female   DOB: [birth year], 58 y.o.   MRN: [id number]  Phill Brazil 03/17/2024, 8:13 AM

## 2024-03-17 NOTE — Progress Notes (Signed)
 PROGRESS NOTE   Subjective/Complaints:  Pt reports LBM yesterday Doesn't feel like has the spasticity like had 1-2 weeks ago.  Asking what else can be done for L hand pain/swelling- trying to keep elevated.   Not wearing Wrist brace this AM  ROS:    Pt denies SOB, abd pain, CP, N/V/C/D, and vision changes   Objective:   VAS US  UPPER EXTREMITY VENOUS DUPLEX Result Date: 03/16/2024 UPPER VENOUS STUDY  Patient Name:  Theresa Watkins  Date of Exam:   03/16/2024 Medical Rec #: [id number]          Accession #:    [id number] Date of Birth: [birth year]          Patient Gender: F Patient Age:   58 years Exam Location:  [facility] Procedure:      VAS US  UPPER EXTREMITY VENOUS DUPLEX Referring Phys: Shaundra Fullam --------------------------------------------------------------------------------  Indications: Swelling, and stroke Risk Factors: None identified. Comparison Study: None. Performing Technologist: Estanislao Heimlich  Examination Guidelines: A complete evaluation includes B-mode imaging, spectral Doppler, color Doppler, and power Doppler as needed of all accessible portions of each vessel. Bilateral testing is considered an integral part of a complete examination. Limited examinations for reoccurring indications may be performed as noted.  Right Findings: +----------+------------+---------+-----------+----------+-------+ RIGHT     CompressiblePhasicitySpontaneousPropertiesSummary +----------+------------+---------+-----------+----------+-------+ Subclavian    Full       Yes       Yes                      +----------+------------+---------+-----------+----------+-------+  Left Findings: +----------+------------+---------+-----------+----------+-------+ LEFT      CompressiblePhasicitySpontaneousPropertiesSummary +----------+------------+---------+-----------+----------+-------+ IJV           Full       Yes       Yes                       +----------+------------+---------+-----------+----------+-------+ Subclavian    Full       Yes       Yes                      +----------+------------+---------+-----------+----------+-------+ Axillary      Full       Yes       Yes                      +----------+------------+---------+-----------+----------+-------+ Brachial      Full       Yes       Yes                      +----------+------------+---------+-----------+----------+-------+ Radial        Full       Yes       Yes                      +----------+------------+---------+-----------+----------+-------+ Ulnar         Full       Yes       Yes                      +----------+------------+---------+-----------+----------+-------+  Cephalic      Full                 Yes                      +----------+------------+---------+-----------+----------+-------+ Basilic       Full                 Yes                      +----------+------------+---------+-----------+----------+-------+  Summary:  Right: No evidence of thrombosis in the subclavian.  Left: No evidence of deep vein thrombosis in the upper extremity. No evidence of superficial vein thrombosis in the upper extremity.  *See table(s) above for measurements and observations.    Preliminary       Recent Labs    03/15/24 0528  WBC 4.6  HGB 13.7  HCT 41.8  PLT 158       Recent Labs    03/15/24 0528  NA 141  K 3.9  CL 109  CO2 25  GLUCOSE 87  BUN 14  CREATININE 0.76  CALCIUM  8.5*        Intake/Output Summary (Last 24 hours) at 03/17/2024 0805 Last data filed at 03/16/2024 1824 Gross per 24 hour  Intake 720 ml  Output --  Net 720 ml         Physical Exam: Vital Signs Blood pressure (!) 143/90, pulse 69, temperature 98.2 F (36.8 C), temperature source Oral, resp. rate 17, height 5\' 6"  (1.676 m), weight 64.4 kg, SpO2 97%.      General: awake, alert, appropriate, woke her up, but  interactive- supine in bed;  NAD HENT: conjugate gaze; oropharynx moist CV: regular rate and rhythm; no JVD Pulmonary: CTA B/L; no W/R/R- good air movement GI: soft, NT, ND, (+)BS Psychiatric: appropriate- more interactive this AM- has questions but cannot remember Neurological: Alert- dysarthria is getting better esp when she enunciates MAS of 0 in LUE Extremities: L hand moderately swollen worse on median side, not as much on ulnar side- fingers also swollen- much better today- down at least 50%  Motor strength 0/5 LUE 3-/5 LLE 5/5 on right side   Dependent edema left hand and wrist.  No hypersensitivity to touch.  No temperature change. -sensory exam normal. Prior neuro assessment is c/w today's exam 03/17/2024.  Skin- L PIP of 2nd digit- has scab     Musculoskeletal:  No tenderness over the left wrist.   Assessment/Plan: 1. Functional deficits which require 3+ hours per day of interdisciplinary therapy in a comprehensive inpatient rehab setting. Physiatrist is providing close team supervision and 24 hour management of active medical problems listed below. Physiatrist and rehab team continue to assess barriers to discharge/monitor patient progress toward functional and medical goals  Care Tool:  Bathing    Body parts bathed by patient: Left arm, Chest, Abdomen, Front perineal area, Right upper leg, Left upper leg, Face   Body parts bathed by helper: Right arm, Right lower leg, Left lower leg     Bathing assist Assist Level: Minimal Assistance - Patient > 75%     Upper Body Dressing/Undressing Upper body dressing   What is the patient wearing?: Pull over shirt    Upper body assist Assist Level: Minimal Assistance - Patient > 75%    Lower Body Dressing/Undressing Lower body dressing      What is the patient wearing?: Pants, Underwear/pull up  Lower body assist Assist for lower body dressing: Minimal Assistance - Patient > 75%     Toileting Toileting     Toileting assist Assist for toileting: Moderate Assistance - Patient 50 - 74%     Transfers Chair/bed transfer  Transfers assist     Chair/bed transfer assist level: Minimal Assistance - Patient > 75%     Locomotion Ambulation   Ambulation assist      Assist level: Minimal Assistance - Patient > 75% Assistive device: Walker-Eva Max distance: 150   Walk 10 feet activity   Assist     Assist level: Moderate Assistance - Patient - 50 - 74% Assistive device: Hand held assist   Walk 50 feet activity   Assist Walk 50 feet with 2 turns activity did not occur: Safety/medical concerns (L hemiplegia and pt fatigue)  Assist level: Moderate Assistance - Patient - 50 - 74% Assistive device: Hand held assist    Walk 150 feet activity   Assist Walk 150 feet activity did not occur: Safety/medical concerns (L hemiplegia and pt fatigue)  Assist level: Minimal Assistance - Patient > 75% Assistive device: Walker-Eva    Walk 10 feet on uneven surface  activity   Assist Walk 10 feet on uneven surfaces activity did not occur: Safety/medical concerns (L hemiplegia and pt fatigue)         Wheelchair     Assist Is the patient using a wheelchair?: Yes Type of Wheelchair: Manual    Wheelchair assist level: Supervision/Verbal cueing Max wheelchair distance: 150    Wheelchair 50 feet with 2 turns activity    Assist        Assist Level: Supervision/Verbal cueing   Wheelchair 150 feet activity     Assist      Assist Level: Minimal Assistance - Patient > 75%   Blood pressure (!) 143/90, pulse 69, temperature 98.2 F (36.8 C), temperature source Oral, resp. rate 17, height 5\' 6"  (1.676 m), weight 64.4 kg, SpO2 97%.   Medical Problem List and Plan: 1. 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 Search for facility ongoing. May only be able to go to custodial care facility  D/c SNF- hopefully by end of week/early next week  Con't CIR PT and OT and SLP  Will order L isotoner compression glove for hand swelling/pain P2P denied- appealed by family.  2. DVT/anticoagulation:  Pharmaceutical: Lovenox              -antiplatelet therapy: Aspirin  81 mg daily and Plavix  75 mg daily x 3 weeks then Plavix  alone 3. Pain Management: Tylenol  as needed  3/25- denies pain- con't regimen rpn   3-30: Ongoing left hip pain; Voltaren  gel and Tylenol .    4/3- having new burning pain in LUE- which is seen sometimes with stroke- will add Duloxetine  30 mg daily for nerve pain- monitor for side effects.  -if gets nausea, will change to Lyrica 4/4-6 no side effects so far-  4/8- will increase Duloxetine  to 60 mg daily  DC hydrocodone  due to side effects, trial tyl #3  as needed for pain that is not relieved by Tylenol . 4/28- advised pt to use voltaren  gel  on all sides of L hand including between fingers- use tylenol  #3 as needed 4/29- pt admits doesn't like taking/using meds all the time- explained I don't have a "fix"- to make it better- have to take meds to have them work 4/30- encouraged again to use voltaren  4x/day for L hand 4. Depression/Behavior/Sleep: Provide emotional support  4/21 mood improved--continue duloxetine              -antipsychotic agents: N/A 5. Neuropsych/cognition: This patient is capable of making decisions on her own behalf. 6. Skin/Wound Care: Routine skin checks 7. Fluids/Electrolytes/Nutrition:encouraging po.   4/21- I personally reviewed the patient's labs today.     -albumin still a bit low--encourage po/proteins. We have discussed improving intake 8.  Dysphagia.  Dysphagia 3 with thins currently  4/18 intake ok    9.  Hyperlipidemia.  Crestor  10.  Recent History  of cocaine/tobacco use.  UDS positive for cocaine.  Provide counseling, this has been a chronic issue , seen in PCP notes 11.  Hypothyroidism. continue Synthroid   12.  Constipation.   Senokot-S 2 tab twice daily  4/23- LBM yesterday  4/24- LBM 2 days ago but feels like needs to go this AM  4/25- LBM yesterday  4/29- LBM yesterday 15. Blurry vision and dysconjugate gaze  3/27- will need Ophtho after d/c.   3-29: Seems to mostly be from the left eye, with difficulty converging and tracking; messaged OT and PT to trial taping versus alternating eye patch to help with convergence.  3-30: Reporting improvement with use of taping  4/4- pt reports everything blurry- cannot see faces as a result- never had eye checked in past- used readers but cannot see at a distance since stroke- will send to Ophtho after d/c.   4/7- d/w pt again- she wants eyeglasses- explained we cannot get when in hospital- also per guidelines, since vision can still change, to not get for 3 months after stroke. She disbelieves it's from stroke, since had 2 prior- that didn't cause vision changes- we educated pt pn how this can occur.   4/25- reminded pt cannot get Optho in hospital- need to get once d/c'd  4/30 went over visual changes with pt- will need to get glasses, likely prisms 2-3 months after d/c once vision stabilizes 16.  Left hip pain.  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 Severe HA-daily HA's  4/15-16 sx appear improved with topamax , tylenol  helps also   -may use sx to get out of therapy at times?  4/23- Per team, pt having HA's  frequently which impairs therapy- she won't discuss HA's with me- unclear if only has a day goes on vs self limting behavior  4/24- increase to 75 mg at bedtime  4/25- still having HA's, actually thinks a little worse since not taking Norco for it-   4/28-4/29 fewer complaints about HA this AM  4/30- per staff, c/o these less- not interfering with therapy as much 18. Decreased safety awareness/impulsiveness  4/30- still requiring max cues 20. Spasticity  4/22- off baclofen , and c/o LUE pain- wondering how much of  her complaints are due to tone?-   4/30- Spasticity improved in LUE- MAS of 0 actually this AM 21. 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 .   4/30- pt pulled scab off-  22.  Urinary  and bowel incont due to CVA, cont toileting program 4/23- still cannot control B/B 4/30- on toileting program- is getting somewhat better, but still incontinent more at night   23. 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   4/29- will get Dopplers, because pt says she elevated hand overnight- still swollen- ordered  4/30- Dopplers (-)- will get isotoner compression glove   I spent a total of 35   minutes on total care today- >50% coordination of care- due to  D/w OT as well as PA about glove- as well as about her need for max cues for safety awareness- LUE pain and improving spasticity  LOS: 37 days A FACE TO FACE EVALUATION WAS PERFORMED  Theresa Watkins 03/17/2024, 8:05 AM

## 2024-03-17 NOTE — Progress Notes (Signed)
 Physical Therapy Session Note  Patient Details  Name: Theresa Watkins MRN: [id number] Date of Birth: 01-09-1966  Today's Date: 03/17/2024 PT Individual Time: 1130-1200 PT Individual Time Calculation (min): 30 min   Short Term Goals: Week 5:  PT Short Term Goal 1 (Week 5): =LTGs d/t ELOS  Skilled Therapeutic Interventions/Progress Updates:      Direct handoff of care from LPN with patient sitting EOB. Donned shoes with totalA for time. Stand pivot transfer to wheelchair at [facility] level with cues for safety and hand placement. Wheeled to day room gym and assisted onto the [place]. Completed 4x2 minute intervals at resistance of 35cm/sec - cues for full AROM bilaterally. Short distance gait training 109ft + 48ft with modA and no AD but patient using therapist for UE support. Cues for increasing R stance time and step length. R knee buckling when fatigued. Pt returned to room at end of treatment and she requested to return to bed. MinA for stand pivot back to bed and assist for repositioning as needed to sit in bed. Left with alarm on and call bell in reach.    Therapy Documentation Precautions:  Precautions Precautions: Fall Precaution/Restrictions Comments: L hemi Restrictions Weight Bearing Restrictions Per Provider Order: No General:      Therapy/Group: Individual Therapy  Pheobe Brass 03/17/2024, 7:51 AM

## 2024-03-17 NOTE — Telephone Encounter (Signed)
 noted

## 2024-03-17 NOTE — Telephone Encounter (Signed)
 Spoke with patient's mother DAVETTA Woldt (patient's mother is on [place]). Davetta was adamant she speak with Dr. Lincoln Renshaw . Advised that Dr. Lincoln Renshaw is out of the office today. Davetta voiced that you are just her assistant you can't help me. I advised Davette I am a Designer, jewellery and I can try to a assist or at least get a message to Dr. Lincoln Renshaw. Davetta voiced "OH" you are a nurse ok maybe you can help.  Davetta had concerns as to why Dr. Lincoln Renshaw had not seen her daughter and been involved in her daughter's care since being in the hospital.  Advised Davetta that while her daughter is hospitalized the hospitalist and other specialists are attending her daughter's medical needs. Dr. Lincoln Renshaw is her daughters' Primary doctor, and she does not come to see patients while they are in the hospital. Davetta was under the impression that Dr. Lincoln Renshaw did. She voiced that her Cardiologist came to see her while she was in the hospital. Advised that her cardiologist is most likely a surgeon, they are specialist, and they have hospital privileges. Advised to ensure her daughter is receiving Continuity of care, the hospitalists, her specialists and [facility] hospital team is managing her daughter's care. Advised that Dr. Lincoln Renshaw is aware of the care her daughter is receiving because she receives all the notes from the Hospital stay.  Advised the when her daughter gets out of the hospital , she will be given or instructed to obtain an appointment with Dr. Lincoln Renshaw for a HFU. At the end of our call Davetta voiced understanding and seemed to be calmer than at the beginning of our conversation. She was appreciative for the return call. Call ended.

## 2024-03-17 NOTE — Plan of Care (Signed)
  Problem: Consults Goal: RH STROKE PATIENT EDUCATION Description: See Patient Education module for education specifics  Outcome: Progressing   Problem: RH BOWEL ELIMINATION Goal: RH STG MANAGE BOWEL WITH ASSISTANCE Description: STG Manage Bowel with toileting Assistance. Outcome: Progressing Flowsheets (Taken 03/17/2024 1542) STG: Pt will manage bowels with assistance: 5-Supervision/curing Goal: RH STG MANAGE BOWEL W/MEDICATION W/ASSISTANCE Description: STG Manage Bowel with Medication with mod I Assistance. Outcome: Progressing   Problem: RH SAFETY Goal: RH STG ADHERE TO SAFETY PRECAUTIONS W/ASSISTANCE/DEVICE Description: STG Adhere to Safety Precautions With cues Assistance/Device. Outcome: Progressing   Problem: RH PAIN MANAGEMENT Goal: RH STG PAIN MANAGED AT OR BELOW PT'S PAIN GOAL Description: Pain managed < 4 with prns Outcome: Progressing

## 2024-03-18 DIAGNOSIS — I63511 Cerebral infarction due to unspecified occlusion or stenosis of right middle cerebral artery: Secondary | ICD-10-CM | POA: Diagnosis not present

## 2024-03-18 MED ORDER — LIDOCAINE 5 % EX PTCH
2.0000 | MEDICATED_PATCH | CUTANEOUS | Status: DC
  Administered 2024-03-18 – 2024-04-27 (×14): 2 via TRANSDERMAL
  Filled 2024-03-18 (×28): qty 2

## 2024-03-18 NOTE — Progress Notes (Signed)
 Occupational Therapy Session Note  Patient Details  Name: Theresa Watkins MRN: [id number] Date of Birth: 28-Jul-1966  Today's Date: 03/18/2024 OT Individual Time: 0815-0910 OT Individual Time Calculation (min): 55 min    Short Term Goals: Week 6:  OT Short Term Goal 1 (Week 6): Pt will perform UB dressing tasks with min A OT Short Term Goal 2 (Week 6): Pt will perfrom toileting tasks with min A  Skilled Therapeutic Interventions/Progress Updates:    Pt resting in bed upon arrival and agreeable to therapy. Skilled OT intervention with focus on bed mobility, sitting balance, LUE NMR, estim, and retrograde massage for edema mgmt. Supine>sit EOB with supervision HOB elevated and pt using bed rails. WB through LUE elbow/forearm to facilitate increased functional movement with minimal improvement, possible 2/2 Lt hand edema and pt's report of tenderness. Attempted use of [place] but pt unable to tolerate. Sitting balance EOB to practice crossing BLE while seated unsupported. Pt with posterior bias when crossing BLE. Pt returned to supine with supervisoin. All needs within reach. Bed alarm activated.   Therapy Documentation Precautions:  Precautions Precautions: Fall Precaution/Restrictions Comments: L hemi Restrictions Weight Bearing Restrictions Per Provider Order: No    Pain:  Pt c/o Lt hand dorsal surface (edema noted) with incrase discomfort with passive finger extension; retrograde massage and repositioning with some relief noted   Other Treatments: Treatments Neuromuscular Facilitation: Left;Upper Extremity;Activity to increase motor control;Activity to increase sustained activation Manual Therapy Manual Therapy: Edema management Edema Management: retrograde massage Weight Bearing Technique Weight Bearing Technique: Yes LUE Weight Bearing Technique: Forearm seated Response to Weight Bearing Technique: minimal increase in finger extension   Therapy/Group: Individual  Therapy  [name] 03/18/2024, 9:13 AM

## 2024-03-18 NOTE — Plan of Care (Signed)
  Problem: Consults Goal: RH STROKE PATIENT EDUCATION Description: See Patient Education module for education specifics  Outcome: Progressing   Problem: RH BOWEL ELIMINATION Goal: RH STG MANAGE BOWEL WITH ASSISTANCE Description: STG Manage Bowel with toileting Assistance. Outcome: Progressing Goal: RH STG MANAGE BOWEL W/MEDICATION W/ASSISTANCE Description: STG Manage Bowel with Medication with mod I Assistance. Outcome: Progressing   Problem: RH SAFETY Goal: RH STG ADHERE TO SAFETY PRECAUTIONS W/ASSISTANCE/DEVICE Description: STG Adhere to Safety Precautions With cues Assistance/Device. Outcome: Progressing   Problem: RH PAIN MANAGEMENT Goal: RH STG PAIN MANAGED AT OR BELOW PT'S PAIN GOAL Description: Pain managed < 4 with prns Outcome: Progressing

## 2024-03-18 NOTE — Progress Notes (Signed)
 Speech Language Pathology Daily Session Note  Patient Details  Name: Theresa Watkins MRN: [id number] Date of Birth: January 31, 1966  Today's Date: 03/18/2024 SLP Individual Time: 8469-6295 SLP Individual Time Calculation (min): 40 min  Short Term Goals: Week 6: SLP Short Term Goal 1 (Week 6): STGs= LTGs due to ELOS  Skilled Therapeutic Interventions: SLP conducted skilled therapy session targeting communication goals. Across various communication tasks, patient utilized SLOP speech intelligibility strategies at sentence and structured conversation utterance lengths to achieve 95% intelligibility with supervision assist. Patient was left in room with call bell in reach and alarm set. SLP will continue to target goals per plan of care.     Of note, though SLP has formally stopped targeted dysphagia management due to stability and ongoing tolerance of Dys3/thin liquid diet, patient requested upgrade to regular/thin liquids. Given aforementioned tolerance without complication of Dys3 diet and prior trials of regular solids during sessions, SLP upgraded diet order to regular/thin and assisted patient with ordering salad for dinner. Will plan to informally check in with patient in the AM to ensure meal went well and was without difficulty.   Patient was left in lowered bed with call bell in reach and bed alarm set. SLP will continue to target goals per plan of care.    Pain Pain Assessment Pain Scale: 0-10 Pain Score: 0-No pain  Therapy/Group: Individual Therapy  [place] Rollyson, M.A., CCC-SLP  Lindzee Gouge A Gianni Fuchs 03/18/2024, 2:02 PM

## 2024-03-18 NOTE — Progress Notes (Signed)
 Recreational Therapy Session Note  Patient Details  Name: Theresa Watkins MRN: [id number] Date of Birth: 05-19-1966 Today's Date: 03/18/2024  Pain: c/o L hand pain, MD/nursing aware Skilled Therapeutic Interventions/Progress Updates:  Goal:  Pt will actively participate in 30 minutes dance group emphasizing endurance, sitting baland and UE use/coordination with supervision.  MET  Pt participated in therapeutic dance group today for 30 minutes.  Pt easily engaged in group conversation, smiling and laughing throughout the group.  Pt performed seated dance(exercise) with demonstration & instructional cues/supervision level.    Therapy/Group: Group Therapy  Kathlyn Leachman 03/18/2024, 3:28 PM

## 2024-03-18 NOTE — Progress Notes (Signed)
 PROGRESS NOTE   Subjective/Complaints:  Pt reports sore this AM- mainly her back- from sleeping in bed, even though uses 4-5 pillows.  Mid R back and buttocks the worst.  Tylenol  #3 works for head- less effective for soreness.   Hasn't gotten compression glove yet.  LBM 4/29 per chart-   ROS:    Pt denies SOB, abd pain, CP, N/V/C/D, and vision changes   Objective:   VAS US  UPPER EXTREMITY VENOUS DUPLEX Result Date: 03/17/2024 UPPER VENOUS STUDY  Patient Name:  Theresa Watkins  Date of Exam:   03/16/2024 Medical Rec #: [id number]          Accession #:    [id number] Date of Birth: [birth year]          Patient Gender: F Patient Age:   58 years Exam Location:  [facility] Procedure:      VAS US  UPPER EXTREMITY VENOUS DUPLEX Referring Phys: Kirklin Mcduffee --------------------------------------------------------------------------------  Indications: Swelling, and stroke Risk Factors: None identified. Comparison Study: None. Performing Technologist: Estanislao Heimlich  Examination Guidelines: A complete evaluation includes B-mode imaging, spectral Doppler, color Doppler, and power Doppler as needed of all accessible portions of each vessel. Bilateral testing is considered an integral part of a complete examination. Limited examinations for reoccurring indications may be performed as noted.  Right Findings: +----------+------------+---------+-----------+----------+-------+ RIGHT     CompressiblePhasicitySpontaneousPropertiesSummary +----------+------------+---------+-----------+----------+-------+ Subclavian    Full       Yes       Yes                      +----------+------------+---------+-----------+----------+-------+  Left Findings: +----------+------------+---------+-----------+----------+-------+ LEFT      CompressiblePhasicitySpontaneousPropertiesSummary +----------+------------+---------+-----------+----------+-------+  IJV           Full       Yes       Yes                      +----------+------------+---------+-----------+----------+-------+ Subclavian    Full       Yes       Yes                      +----------+------------+---------+-----------+----------+-------+ Axillary      Full       Yes       Yes                      +----------+------------+---------+-----------+----------+-------+ Brachial      Full       Yes       Yes                      +----------+------------+---------+-----------+----------+-------+ Radial        Full       Yes       Yes                      +----------+------------+---------+-----------+----------+-------+ Ulnar         Full       Yes       Yes                      +----------+------------+---------+-----------+----------+-------+  Cephalic      Full                 Yes                      +----------+------------+---------+-----------+----------+-------+ Basilic       Full                 Yes                      +----------+------------+---------+-----------+----------+-------+  Summary:  Right: No evidence of thrombosis in the subclavian.  Left: No evidence of deep vein thrombosis in the upper extremity. No evidence of superficial vein thrombosis in the upper extremity.  *See table(s) above for measurements and observations.  Diagnosing physician: Genny Kid MD Electronically signed by Genny Kid MD on 03/17/2024 at 8:06:29 PM.    Final       No results for input(s): "WBC", "HGB", "HCT", "PLT" in the last 72 hours.      No results for input(s): "NA", "K", "CL", "CO2", "GLUCOSE", "BUN", "CREATININE", "CALCIUM " in the last 72 hours.       Intake/Output Summary (Last 24 hours) at 03/18/2024 0810 Last data filed at 03/17/2024 1250 Gross per 24 hour  Intake 476 ml  Output --  Net 476 ml         Physical Exam: Vital Signs Blood pressure 138/84, pulse 71, temperature 98.6 F (37 C), temperature source Oral,  resp. rate 18, height 5\' 6"  (1.676 m), weight 64.4 kg, SpO2 91%.       General: awake, alert, appropriate, getting set up to eat by NT; NAD HENT: conjugate gaze; oropharynx moist CV: regular rate and rhythm; no JVD Pulmonary: CTA B/L; no W/R/R- good air movement GI: soft, NT, ND, (+)BS- slightly hypoactive- hasn't eaten yet Psychiatric: appropriate- more interactive today Neurological: : Alert- dysarthria is getting better esp when she enunciates MAS of 0 in LUE Extremities: L hand moderately swollen worse on median side, not as much on ulnar side- fingers also swollen- much better today- less swelling- even less than yesterday in L hand  Motor strength 0/5 LUE 3-/5 LLE 5/5 on right side   Dependent edema left hand and wrist.  No hypersensitivity to touch.  No temperature change. -sensory exam normal. Prior neuro assessment is c/w today's exam 03/18/2024.  Skin- L PIP of 2nd digit- has scab     Musculoskeletal:  No tenderness over the left wrist.   Assessment/Plan: 1. Functional deficits which require 3+ hours per day of interdisciplinary therapy in a comprehensive inpatient rehab setting. Physiatrist is providing close team supervision and 24 hour management of active medical problems listed below. Physiatrist and rehab team continue to assess barriers to discharge/monitor patient progress toward functional and medical goals  Care Tool:  Bathing    Body parts bathed by patient: Left arm, Chest, Abdomen, Front perineal area, Buttocks, Right upper leg, Left upper leg, Right lower leg, Left lower leg, Face   Body parts bathed by helper: Right arm, Right lower leg, Left lower leg Body parts n/a: Right arm   Bathing assist Assist Level: Minimal Assistance - Patient > 75%     Upper Body Dressing/Undressing Upper body dressing   What is the patient wearing?: Pull over shirt    Upper body assist Assist Level: Moderate Assistance - Patient 50 - 74%    Lower Body  Dressing/Undressing Lower body dressing      What  is the patient wearing?: Pants, Underwear/pull up     Lower body assist Assist for lower body dressing: Minimal Assistance - Patient > 75%     Toileting Toileting    Toileting assist Assist for toileting: Moderate Assistance - Patient 50 - 74%     Transfers Chair/bed transfer  Transfers assist     Chair/bed transfer assist level: Minimal Assistance - Patient > 75%     Locomotion Ambulation   Ambulation assist      Assist level: Minimal Assistance - Patient > 75% Assistive device: Walker-Eva Max distance: 150   Walk 10 feet activity   Assist     Assist level: Moderate Assistance - Patient - 50 - 74% Assistive device: Hand held assist   Walk 50 feet activity   Assist Walk 50 feet with 2 turns activity did not occur: Safety/medical concerns (L hemiplegia and pt fatigue)  Assist level: Moderate Assistance - Patient - 50 - 74% Assistive device: Hand held assist    Walk 150 feet activity   Assist Walk 150 feet activity did not occur: Safety/medical concerns (L hemiplegia and pt fatigue)  Assist level: Minimal Assistance - Patient > 75% Assistive device: Walker-Eva    Walk 10 feet on uneven surface  activity   Assist Walk 10 feet on uneven surfaces activity did not occur: Safety/medical concerns (L hemiplegia and pt fatigue)         Wheelchair     Assist Is the patient using a wheelchair?: Yes Type of Wheelchair: Manual    Wheelchair assist level: Supervision/Verbal cueing Max wheelchair distance: 150    Wheelchair 50 feet with 2 turns activity    Assist        Assist Level: Supervision/Verbal cueing   Wheelchair 150 feet activity     Assist      Assist Level: Minimal Assistance - Patient > 75%   Blood pressure 138/84, pulse 71, temperature 98.6 F (37 C), temperature source Oral, resp. rate 18, height 5\' 6"  (1.676 m), weight 64.4 kg, SpO2 91%.   Medical Problem  List and Plan: 1. 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 Search for facility ongoing. May only be able to go to custodial care facility  D/c SNF- hopefully by end of week/early next week  Con't CIR  Pending isotoner glove for L hand as well as Lwrist brace P2P denied- appealed by family.  2. DVT/anticoagulation:  Pharmaceutical: Lovenox              -antiplatelet therapy: Aspirin  81 mg daily and Plavix  75 mg daily x 3 weeks then Plavix  alone 3. Pain Management: Tylenol  as needed  3/25- denies pain- con't regimen rpn   3-30: Ongoing left hip pain; Voltaren  gel and Tylenol .    4/3- having new burning pain in LUE- which is seen sometimes with stroke- will add Duloxetine  30 mg daily for nerve pain- monitor for side effects.  -if gets nausea, will change to Lyrica 4/4-6 no side effects so far-  4/8- will increase Duloxetine  to 60 mg daily  DC hydrocodone  due to side effects, trial tyl #3  as needed for pain that is not relieved by Tylenol . 4/28-  advised pt to use voltaren  gel  on all sides of L hand including between fingers- use tylenol  #3 as needed 4/29- pt admits doesn't like taking/using meds all the time- explained I don't have a "fix"- to make it better- have to take meds to have them work 4/30- encouraged again to use voltaren  4x/day for L hand 5/1- pt said using voltaren  gel and tylenol  #3 as needed- adding lidoderm  patches 2 patches at night for back pain 4. Depression/Behavior/Sleep: Provide emotional support  4/21 mood improved--continue duloxetine              -antipsychotic agents: N/A 5. Neuropsych/cognition: This patient is capable of making decisions on her own behalf. 6. Skin/Wound Care: Routine skin checks 7.  Fluids/Electrolytes/Nutrition:encouraging po.   4/21- I personally reviewed the patient's labs today.     -albumin still a bit low--encourage po/proteins. We have discussed improving intake 8.  Dysphagia.  Dysphagia 3 with thins currently  4/18 intake ok    9.  Hyperlipidemia.  Crestor  10.  Recent History of cocaine/tobacco use.  UDS positive for cocaine.  Provide counseling, this has been a chronic issue , seen in PCP notes 11.  Hypothyroidism. continue Synthroid   12.  Constipation.   Senokot-S 2 tab twice daily  4/23- LBM yesterday  4/24- LBM 2 days ago but feels like needs to go this AM  4/25- LBM yesterday  4/29- LBM yesterday 15. Blurry vision and dysconjugate gaze  3/27- will need Ophtho after d/c.   3-29: Seems to mostly be from the left eye, with difficulty converging and tracking; messaged OT and PT to trial taping versus alternating eye patch to help with convergence.  3-30: Reporting improvement with use of taping  4/4- pt reports everything blurry- cannot see faces as a result- never had eye checked in past- used readers but cannot see at a distance since stroke- will send to Ophtho after d/c.   4/7- d/w pt again- she wants eyeglasses- explained we cannot get when in hospital- also per guidelines, since vision can still change, to not get for 3 months after stroke. She disbelieves it's from stroke, since had 2 prior- that didn't cause vision changes- we educated pt pn how this can occur.   4/25- reminded pt cannot get Optho in hospital- need to get once d/c'd  4/30 went over visual changes with pt- will need to get glasses, likely prisms 2-3 months after d/c once vision stabilizes 16.  Left hip pain.  -3-29 x-ray showing moderate degenerative joint disease of the hip; no fracture  -Continue Voltaren  gel to hip 4 times daily  -May benefit from steroid injection as an outpatient  3/31- started Vit D and Ca per pt request  - improved to an extent  4/24- Pt reports L hip still  hurts when stands/works with therapy however don't want her on opiates regularly due to can impair sensorium and she also ready has issues  17. Severe HA-daily HA's  4/15-16 sx appear improved with topamax , tylenol  helps also   -may use sx to get out of therapy at times?  4/23- Per team, pt having HA's frequently which impairs therapy- she won't discuss HA's with me- unclear if only has a day goes on vs self limting behavior  4/24- increase to 75 mg at bedtime  4/25- still having HA's, actually thinks a little worse since not taking Norco for it-   4/28-4/29 fewer complaints about HA this AM  4/30-5/1- - per staff, c/o these less- not  interfering with therapy as much 18. Decreased safety awareness/impulsiveness  4/30- still requiring max cues 20. Spasticity  4/22- off baclofen , and c/o LUE pain- wondering how much of her complaints are due to tone?-   4/30- Spasticity improved in LUE- MAS of 0 actually this AM 21. 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 .   4/30- pt pulled scab off-  22.  Urinary  and bowel incont due to CVA, cont toileting program 4/23- still cannot control B/B 4/30- on toileting program- is getting somewhat better, but still incontinent more at night   23. 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   4/29- will get Dopplers, because pt says she elevated hand overnight- still swollen- ordered  4/30- Dopplers (-)- will get isotoner compression glove     LOS: 38 days A FACE TO FACE EVALUATION WAS PERFORMED  Theresa Watkins 03/18/2024, 8:10 AM

## 2024-03-18 NOTE — Progress Notes (Signed)
 Patient ID: Theresa Watkins, female   DOB: December 30, 1965, 59 y.o.   MRN: [id number]  16- SW spoke with Renay Carota with CM Department of [facility] [phone])  to check appeal status for Requisition [id number].   She reports the number provided was the requisition number for the dismissed appeal. Reports correct requisition number: # [id number] is still listed a standard appeal  in processing and remains listed as a standard appeal with priority requested. States that this SW will need to submit a request to indicate above requisition number needs to be listed as an expedited appeal, and additional clinicals can be submitted. SW faxed for expedited appeal request with clinicals to [phone].  Norval Been, MSW, LCSW Office: [phone] Cell: [phone] Fax: [phone]

## 2024-03-18 NOTE — Progress Notes (Signed)
 Physical Therapy Session Note  Patient Details  Name: Theresa Watkins MRN: [id number] Date of Birth: Jan 07, 1966  Today's Date: 03/18/2024 PT Individual Time: 1023-1100 PT Individual Time Calculation (min): 37 min   Short Term Goals: Week 5:  PT Short Term Goal 1 (Week 5): =LTGs d/t ELOS  Skilled Therapeutic Interventions/Progress Updates:    pt received in bed and agreeable to therapy. Pt reports unrated LUE pain, declines intervention. Donned pants with min a to pull over hips in standing. Min a Stand pivot transfer. Cues for retropulsion, with improvement during transfer vs standing for pants. Session focused on gait training with eva walker 2 x 180 ft and 1 x 90 ft. Note scissoring gait pattern, poor foot clearance, and knee flexion in stance. Pt was able to improve flexion stance when cued to pay attention to LLE and was able to correct with cueing. Pt remained in w/c and handed off for participation in dance group.   Therapy Documentation Precautions:  Precautions Precautions: Fall Precaution/Restrictions Comments: L hemi Restrictions Weight Bearing Restrictions Per Provider Order: No General:       Therapy/Group: Individual Therapy  Tex Filbert 03/18/2024, 4:21 PM

## 2024-03-18 NOTE — Progress Notes (Signed)
 Occupational Therapy Weekly Progress Note  Patient Details  Name: Theresa Watkins MRN: [id number] Date of Birth: 03/22/1966  Beginning of progress report period: March 09, 2024 End of progress report period: Mar 18, 2024  Patient has met 1 of 3 short term goals.  Pt progress has been inconsistent over the past report period. Pt continues to require max verbal cues for safety awareness, especially during transitional movements. Pt completes bathing at shower level with min A seated on TTB. UB dressing fluctuates between min A and mod A with mod verbal cues for hemi dressing techniques. LB dressing fluctuates between mod A and max A with max verbal cues for hemi dressing techniques. Toielting with mod A and max verbal cues for safety awareness. Pt requires mod verbal cues to initiate use of LUE in functional tasks. Pt with increased LUE/hand edema and MD has ordered Isotoner glove for edema mgmt. Pt also c/o Lt hand tenderness. Pt awaiting SNF placement.  Patient continues to demonstrate the following deficits: muscle weakness, decreased cardiorespiratoy endurance, impaired timing and sequencing, abnormal tone, unbalanced muscle activation, motor apraxia, and decreased coordination, decreased initiation, decreased attention, decreased awareness, decreased problem solving, decreased safety awareness, decreased memory, and delayed processing, and decreased sitting balance, decreased standing balance, decreased postural control, hemiplegia, and decreased balance strategies and therefore will continue to benefit from skilled OT intervention to enhance overall performance with BADL and Reduce care partner burden.  Patient progressing toward long term goals..  Continue plan of care.  OT Short Term Goals Week 5:  OT Short Term Goal 1 (Week 5): Pt will complete LB dressing tasks with mod A OT Short Term Goal 1 - Progress (Week 5): Met OT Short Term Goal 2 (Week 5): Pt will perform UB dressing tasks with min  A OT Short Term Goal 2 - Progress (Week 5): Progressing toward goal OT Short Term Goal 3 (Week 5): Pt will perfrom toileting tasks with min A OT Short Term Goal 3 - Progress (Week 5): Progressing toward goal Week 6:  OT Short Term Goal 1 (Week 6): Pt will perform UB dressing tasks with min A OT Short Term Goal 2 (Week 6): Pt will perfrom toileting tasks with min A  [name] 03/18/2024, 6:55 AM

## 2024-03-19 DIAGNOSIS — I63511 Cerebral infarction due to unspecified occlusion or stenosis of right middle cerebral artery: Secondary | ICD-10-CM | POA: Diagnosis not present

## 2024-03-19 MED ORDER — TOPIRAMATE 25 MG PO TABS
75.0000 mg | ORAL_TABLET | Freq: Every day | ORAL | Status: DC
  Administered 2024-03-19 – 2024-04-06 (×6): 75 mg via ORAL
  Filled 2024-03-19 (×12): qty 3

## 2024-03-19 NOTE — Progress Notes (Addendum)
 Patient ID: MARLIA FOWLIE, female   DOB: [birth year], 58 y.o.   MRN: [id number]  SW spoke with Valerie/CM Dept [phone]; # [id number]) to request speaking with a supervisor after receiving another fax reporting appeal was dismissed. SW explained patient has suffered a stoke and unable to provide written consent. SW transferred to  [place] to discuss above. She reports reached out to the RN working on the case who will ask her supervisor if an exception can be made as they want a written consent from the patient. Reports RN or she will follow-up. Current appeal remains in processing under standard 30 days.   SW received updates from [place]/Financial Counseling reporting DSS FL2 form needs to be completed. SW completed form and sent back to [place] and DSS so this can be added to her LTC Medicaid application.   *Pt has full Medicaid coverage.   SW received fax reporting expedited appeal submitted yesterday was dismissed. No explanation provided.   SW spoke with Radiographer, therapeutic to see if any updates. Reports still waiting on updates, however, exception granted for no written consent required. SW also addressed concerns about expedited appeal request submitted yesterday in reference to above REQ# but was given a new appeal ID#. She is escalated concern to supervisor working on the case and will ask for review for expedited appeal to be added to above REQ#. SW filed complaint due to inconsistencies and complications with addressing only one appeal ID#. CASE # [id number]. Reports there will be follow-up once more information.   Norval Been, MSW, LCSW Office: [phone] Cell: [phone] Fax: [phone]

## 2024-03-19 NOTE — Progress Notes (Signed)
 Physical Therapy Weekly Progress Note  Patient Details  Name: Theresa Watkins MRN: [id number] Date of Birth: [birth year]  Beginning of progress report period: March 12, 2024 End of progress report period: Mar 19, 2024  Today's Date: 03/19/2024 PT Individual Time: 1115-1200 PT Individual Time Calculation (min): 45 min   Patient has met 1 of 1 short term goals.  [name] is showing greater improvement this week. She has been able to ambulate with eva walker and hemi walker and is improving with bed/chair transfers. With ideal set up she is nearing supervision levels and requires min a with less than ideal set up to her L side. She still requires cues for safety d/t impulsivity and will require supervision at d/c.   Patient continues to demonstrate the following deficits muscle weakness, impaired timing and sequencing, abnormal tone, decreased coordination, and decreased motor planning, and decreased standing balance, decreased postural control, hemiplegia, and decreased balance strategies and therefore will continue to benefit from skilled PT intervention to increase functional independence with mobility.  Patient progressing toward long term goals..  Continue plan of care.  PT Short Term Goals Week 5:  PT Short Term Goal 1 (Week 5): =LTGs d/t ELOS PT Short Term Goal 1 - Progress (Week 5): Met Week 6:  PT Short Term Goal 1 (Week 6): pt will ambulate 100 ft with hemi walker PT Short Term Goal 2 (Week 6): pt will perform bed/chair transfers with CGA both directions  Skilled Therapeutic Interventions/Progress Updates:    pt received in bed and agreeable to therapy. Pt reports UE pain continues unchanged but requires no intervention at this time. Bed mobility with supervision. Bed chair transfer with min HHA. Pt transported to therapy gym for time management and energy conservation. Pt participated in block practice of turning and short distance gait in //bars, with 1 repetition walking backwards  for balance, coordination, and to improve transfers with UUE support. Intermittent min a required for balance and retropulsion. Trialled gait with hemi walker. Pt ambulated 2 x 25 ft with HW and mod a. Limited by retropulsion but able to correct with cueing to put weight in her toes. Pt returned to room and to bed in the same manner, was left with all needs in reach and alarm active.   Therapy Documentation Precautions:  Precautions Precautions: Fall Precaution/Restrictions Comments: L hemi Restrictions Weight Bearing Restrictions Per Provider Order: No General:     Therapy/Group: Individual Therapy  Tex Filbert 03/19/2024, 1:14 PM

## 2024-03-19 NOTE — Progress Notes (Signed)
 PROGRESS NOTE   Subjective/Complaints:  Pt reports hasn't received glove yet, but they wrapped her LUE in ACE wrap which has been helpful.  Speech sometimes "off" in AM- wasn't sure why.   Still having HA's- said wasn't receiving the topamax , which per chart, is correct- somehow went to prn- said wakes up with HA daily- taking tylenol  #3 which is helpful. -  Slept great for first time lately.   ROS:    Pt denies SOB, abd pain, CP, N/V/C/D, and vision changes   Objective:   No results found.     No results for input(s): "WBC", "HGB", "HCT", "PLT" in the last 72 hours.      No results for input(s): "NA", "K", "CL", "CO2", "GLUCOSE", "BUN", "CREATININE", "CALCIUM " in the last 72 hours.      No intake or output data in the 24 hours ending 03/19/24 0824        Physical Exam: Vital Signs Blood pressure (!) 157/91, pulse 66, temperature 98.4 F (36.9 C), temperature source Oral, resp. rate 18, height 5\' 6"  (1.676 m), weight 64.4 kg, SpO2 98%.        General: awake, alert, appropriate, supine- just woke up-  NAD HENT: conjugate gaze; oropharynx moist CV: regular rate and rhythm; no JVD Pulmonary: CTA B/L; no W/R/R- good air movement GI: soft, NT, ND, (+)BS Psychiatric: appropriate- interactive Neurological: Ox3- good memory this AM about topamax  not receiving it MAS of 0 in LUE Extremities: L hand moderately swollen worse on median side, not as much on ulnar side- fingers also swollen- much better today- less swelling- even less than yesterday in L hand- now has ACE wrap on it- swelling still evident, but better with wrapping  Motor strength 0/5 LUE 3-/5 LLE 5/5 on right side   Dependent edema left hand and wrist.  No hypersensitivity to touch.  No temperature change. -sensory exam normal. Prior neuro assessment is c/w today's exam 03/19/2024.  Skin- L PIP of 2nd digit- has scab     Musculoskeletal:   No tenderness over the left wrist.   Assessment/Plan: 1. Functional deficits which require 3+ hours per day of interdisciplinary therapy in a comprehensive inpatient rehab setting. Physiatrist is providing close team supervision and 24 hour management of active medical problems listed below. Physiatrist and rehab team continue to assess barriers to discharge/monitor patient progress toward functional and medical goals  Care Tool:  Bathing    Body parts bathed by patient: Left arm, Chest, Abdomen, Front perineal area, Buttocks, Right upper leg, Left upper leg, Right lower leg, Left lower leg, Face   Body parts bathed by helper: Right arm, Right lower leg, Left lower leg Body parts n/a: Right arm   Bathing assist Assist Level: Minimal Assistance - Patient > 75%     Upper Body Dressing/Undressing Upper body dressing   What is the patient wearing?: Pull over shirt    Upper body assist Assist Level: Moderate Assistance - Patient 50 - 74%    Lower Body Dressing/Undressing Lower body dressing      What is the patient wearing?: Pants, Underwear/pull up     Lower body assist Assist for lower body dressing: Minimal  Assistance - Patient > 75%     Toileting Toileting    Toileting assist Assist for toileting: Moderate Assistance - Patient 50 - 74%     Transfers Chair/bed transfer  Transfers assist     Chair/bed transfer assist level: Minimal Assistance - Patient > 75%     Locomotion Ambulation   Ambulation assist      Assist level: Minimal Assistance - Patient > 75% Assistive device: Walker-Eva Max distance: 150   Walk 10 feet activity   Assist     Assist level: Moderate Assistance - Patient - 50 - 74% Assistive device: Hand held assist   Walk 50 feet activity   Assist Walk 50 feet with 2 turns activity did not occur: Safety/medical concerns (L hemiplegia and pt fatigue)  Assist level: Moderate Assistance - Patient - 50 - 74% Assistive device: Hand  held assist    Walk 150 feet activity   Assist Walk 150 feet activity did not occur: Safety/medical concerns (L hemiplegia and pt fatigue)  Assist level: Minimal Assistance - Patient > 75% Assistive device: Walker-Eva    Walk 10 feet on uneven surface  activity   Assist Walk 10 feet on uneven surfaces activity did not occur: Safety/medical concerns (L hemiplegia and pt fatigue)         Wheelchair     Assist Is the patient using a wheelchair?: Yes Type of Wheelchair: Manual    Wheelchair assist level: Supervision/Verbal cueing Max wheelchair distance: 150    Wheelchair 50 feet with 2 turns activity    Assist        Assist Level: Supervision/Verbal cueing   Wheelchair 150 feet activity     Assist      Assist Level: Minimal Assistance - Patient > 75%   Blood pressure (!) 157/91, pulse 66, temperature 98.4 F (36.9 C), temperature source Oral, resp. rate 18, height 5\' 6"  (1.676 m), weight 64.4 kg, SpO2 98%.   Medical Problem List and Plan: 1. 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 Search for facility ongoing. May only be able to go to custodial care facility  D/c SNF- hopefully by end of week/early next week  Con't CIR  Still hasn't received isotoner glove- they are wrapping for now  Con't CIR PT, OT and SLP P2P denied- appealed by family.  2. DVT/anticoagulation:  Pharmaceutical: Lovenox              -antiplatelet therapy: Aspirin  81 mg daily and Plavix  75 mg daily x 3 weeks then Plavix  alone 3. Pain Management: Tylenol  as needed  3/25- denies pain- con't regimen rpn   3-30: Ongoing left hip pain; Voltaren  gel and Tylenol .    4/3- having new burning pain in LUE- which is seen  sometimes with stroke- will add Duloxetine  30 mg daily for nerve pain- monitor for side effects.  -if gets nausea, will change to Lyrica 4/4-6 no side effects so far-  4/8- will increase Duloxetine  to 60 mg daily  DC hydrocodone  due to side effects, trial tyl #3  as needed for pain that is not relieved by Tylenol . 4/28- advised pt to use voltaren  gel  on all sides of L  hand including between fingers- use tylenol  #3 as needed 4/29- pt admits doesn't like taking/using meds all the time- explained I don't have a "fix"- to make it better- have to take meds to have them work 4/30- encouraged again to use voltaren  4x/day for L hand 5/1- pt said using voltaren  gel and tylenol  #3 as needed- adding lidoderm  patches 2 patches at night for back pain 5/2- HA's- will restart SCHEDULED Topamax  75 mg at bedtime- not sure what occurred? Needs to get nightly 4. Depression/Behavior/Sleep: Provide emotional support  4/21 mood improved--continue duloxetine              -antipsychotic agents: N/A 5. Neuropsych/cognition: This patient is capable of making decisions on her own behalf. 6. Skin/Wound Care: Routine skin checks 7. Fluids/Electrolytes/Nutrition:encouraging po.   4/21- I personally reviewed the patient's labs today.     -albumin still a bit low--encourage po/proteins. We have discussed improving intake 8.  Dysphagia.  Dysphagia 3 with thins currently  4/18 intake ok    9.  Hyperlipidemia.  Crestor  10.  Recent History of cocaine/tobacco use.  UDS positive for cocaine.  Provide counseling, this has been a chronic issue , seen in PCP notes 11.  Hypothyroidism. continue Synthroid   12.  Constipation.   Senokot-S 2 tab twice daily  4/23- LBM yesterday  4/24- LBM 2 days ago but feels like needs to go this AM  4/25- LBM yesterday  4/29- LBM yesterday  5/2- LBM 2 days ago 15. Blurry vision and dysconjugate gaze  3/27- will need Ophtho after d/c.   3-29: Seems to mostly be from the left eye, with difficulty  converging and tracking; messaged OT and PT to trial taping versus alternating eye patch to help with convergence.  3-30: Reporting improvement with use of taping  4/4- pt reports everything blurry- cannot see faces as a result- never had eye checked in past- used readers but cannot see at a distance since stroke- will send to Ophtho after d/c.   4/7- d/w pt again- she wants eyeglasses- explained we cannot get when in hospital- also per guidelines, since vision can still change, to not get for 3 months after stroke. She disbelieves it's from stroke, since had 2 prior- that didn't cause vision changes- we educated pt pn how this can occur.   4/25- reminded pt cannot get Optho in hospital- need to get once d/c'd  4/30 went over visual changes with pt- will need to get glasses, likely prisms 2-3 months after d/c once vision stabilizes 16.  Left hip pain.  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 Severe HA-daily HA's  4/15-16 sx appear improved with topamax , tylenol  helps also   -may use sx to get out of therapy at times?  4/23- Per team, pt having HA's frequently which impairs therapy- she won't discuss HA's with me- unclear if only has a day goes on vs self limting behavior  4/24- increase to 75 mg at bedtime  4/25- still having HA's, actually thinks a little worse since not taking Norco for it-   4/28-4/29 fewer complaints about HA  this AM  4/30-5/1- - per staff, c/o these less- not interfering with therapy as much  5/2- Wasn't getting topamax - just taking T#3- wil restart Topamax - don't know what occurred with order 18.  Decreased safety awareness/impulsiveness  4/30- still requiring max cues 20. Spasticity  4/22- off baclofen , and c/o LUE pain- wondering how much of her complaints are due to tone?-   4/30- Spasticity improved in LUE- MAS of 0 actually this AM 21. 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 .   4/30- pt pulled scab off-  22.  Urinary  and bowel incont due to CVA, cont toileting program 4/23- still cannot control B/B 4/30- on toileting program- is getting somewhat better, but still incontinent more at night   23. 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   4/29- will get Dopplers, because pt says she elevated hand overnight- still swollen- ordered  4/30- Dopplers (-)- will get isotoner compression glove  5/2- waiting for glove- wrapping in spite of this   I spent a total of 36  minutes on total care today- >50% coordination of care- due to  D/w nursing about Topamax  and HA's- and wrapping, checked with OT to check on glove    LOS: 39 days A FACE TO FACE EVALUATION WAS PERFORMED  Theresa Watkins 03/19/2024, 8:24 AM

## 2024-03-19 NOTE — Progress Notes (Signed)
 Occupational Therapy Session Note  Patient Details  Name: Theresa Watkins MRN: [id number] Date of Birth: 06/08/1966  Today's Date: 03/19/2024 OT Individual Time: 4098-1191 OT Individual Time Calculation (min): 71 min    Short Term Goals: Week 6:  OT Short Term Goal 1 (Week 6): Pt will perform UB dressing tasks with min A OT Short Term Goal 2 (Week 6): Pt will perfrom toileting tasks with min A  Skilled Therapeutic Interventions/Progress Updates:    Skilled OT intervention with focus on sit<>stand, standing balance, bathing at shower level, dressing seated EOB and sit<>stand, hemi dressing techniques, and safety awareness to increase indepenene with BADLS. Pt initiated use of LUE to bathe RUE but required assistance to complete task. Pt bathe all other body parts while seated on TTB. Pt able to corss LE in shower and seated EOB to thread BLE into pants. Sit<>stand from EOB with min A (BLE braced against EOB). Standing balance with min A to facilitate OTA assisting with pulling pants over hips. Pt required min verbal cues for hemi dressing techaniques. Pt donned socks without assistance. No unsafet behaviors noted and pt not as impulsive this morning. Pt reamined in bed with all needs within reach. Bed alarm activated.   Therapy Documentation Precautions:  Precautions Precautions: Fall Precaution/Restrictions Comments: L hemi Restrictions Weight Bearing Restrictions Per Provider Order: No   Pain:  Pt reports LUE/hand tenderness/discomfort (unrated); Ace wrap for edema mgmt with pt reporting relief   Therapy/Group: Individual Therapy  [name] 03/19/2024, 10:42 AM

## 2024-03-19 NOTE — Plan of Care (Signed)
  Problem: Consults Goal: RH STROKE PATIENT EDUCATION Description: See Patient Education module for education specifics  Outcome: Progressing   Problem: RH BOWEL ELIMINATION Goal: RH STG MANAGE BOWEL WITH ASSISTANCE Description: STG Manage Bowel with toileting Assistance. Outcome: Progressing Goal: RH STG MANAGE BOWEL W/MEDICATION W/ASSISTANCE Description: STG Manage Bowel with Medication with mod I Assistance. Outcome: Progressing   Problem: RH SAFETY Goal: RH STG ADHERE TO SAFETY PRECAUTIONS W/ASSISTANCE/DEVICE Description: STG Adhere to Safety Precautions With cues Assistance/Device. Outcome: Progressing   Problem: RH PAIN MANAGEMENT Goal: RH STG PAIN MANAGED AT OR BELOW PT'S PAIN GOAL Description: Pain managed < 4 with prns Outcome: Progressing   Problem: RH KNOWLEDGE DEFICIT Goal: RH STG INCREASE KNOWLEDGE OF HYPERTENSION Description: Patient and mother will be able to manage HTN using educational resources for medications and dietary modification independently Outcome: Progressing

## 2024-03-19 NOTE — Progress Notes (Signed)
 Occupational Therapy Session Note  Patient Details  Name: Theresa Watkins MRN: [id number] Date of Birth: Nov 13, 1966  Today's Date: 03/19/2024 OT Individual Time: 1400-1430 OT Individual Time Calculation (min): 30 min    Short Term Goals: Week 6:  OT Short Term Goal 1 (Week 6): Pt will perform UB dressing tasks with min A OT Short Term Goal 2 (Week 6): Pt will perfrom toileting tasks with min A  Skilled Therapeutic Interventions/Progress Updates:    Pt sleeping upon arrival but easily aroused. Skilled OT intervention with focus on LUE/hand edema mgmt, LUE NMR, sitting balance, bed mobility, and LUE sustained activation to increase independence with BADLS and functional use of LUE. Bed mobility (supine<>sit EOB, rolling) with supervision. Sitting balance EOB with supervision for WB through LUE to facilitate LUE activation and functional use. Lateral leans to R/L with CGA. Ace wrap removed and replaced on LUE/hand with improvement in edema noted. LUE function focus on reaching against gravity with improved activation and sustained activation noted. Pt fatigues quickly. Pt returned to supine and reamined in bed with all needs within reach. Bed alarm activated.   Therapy Documentation Precautions:  Precautions Precautions: Fall Precaution/Restrictions Comments: L hemi Restrictions Weight Bearing Restrictions Per Provider Order: No Pain: Pt continues to report LUE/hand tenderness and increased discomfort with finger extension; ace wrap for edema mgmt and gentle retrograde massage with minimal relief reported   Other Treatments: Treatments Neuromuscular Facilitation: Left;Upper Extremity;Activity to increase motor control;Activity to increase sustained activation Weight Bearing Technique Weight Bearing Technique: Yes LUE Weight Bearing Technique: Forearm seated Response to Weight Bearing Technique: improved shoulder flexion and sustained position   Therapy/Group: Individual  Therapy  [name] 03/19/2024, 2:34 PM

## 2024-03-20 DIAGNOSIS — M19041 Primary osteoarthritis, right hand: Secondary | ICD-10-CM

## 2024-03-20 DIAGNOSIS — G441 Vascular headache, not elsewhere classified: Secondary | ICD-10-CM | POA: Diagnosis not present

## 2024-03-20 DIAGNOSIS — I63511 Cerebral infarction due to unspecified occlusion or stenosis of right middle cerebral artery: Secondary | ICD-10-CM | POA: Diagnosis not present

## 2024-03-20 NOTE — Progress Notes (Signed)
 PROGRESS NOTE   Subjective/Complaints:  Headaches better with topamax  last night. Able to sleep fairly well. No complains this morning.   ROS: Patient denies fever, rash, sore throat, blurred vision, dizziness, nausea, vomiting, diarrhea, cough, shortness of breath or chest pain, joint or back/neck pain,  or mood change.    Objective:   No results found.     No results for input(s): "WBC", "HGB", "HCT", "PLT" in the last 72 hours.      No results for input(s): "NA", "K", "CL", "CO2", "GLUCOSE", "BUN", "CREATININE", "CALCIUM " in the last 72 hours.       Intake/Output Summary (Last 24 hours) at 03/20/2024 1056 Last data filed at 03/20/2024 1007 Gross per 24 hour  Intake 0 ml  Output --  Net 0 ml          Physical Exam: Vital Signs Blood pressure (!) 144/88, pulse 72, temperature 98 F (36.7 C), temperature source Oral, resp. rate 16, height 5\' 6"  (1.676 m), weight 64.4 kg, SpO2 96%.        Constitutional: No distress . Vital signs reviewed. HEENT: NCAT, EOMI, oral membranes moist Neck: supple Cardiovascular: RRR without murmur. No JVD    Respiratory/Chest: CTA Bilaterally without wheezes or rales. Normal effort    GI/Abdomen: BS +, non-tender, non-distended Ext: no clubbing, cyanosis, or edema Psych: pleasant and cooperative  Neurological: Ox3-   MAS of 0 in LUE Extremities: L hand moderately swollen worse on median side, not as much on ulnar side- fingers also swollen- much better today- less swelling- even less than yesterday in L hand- now has ACE wrap on it- swelling still evident, but better with wrapping  Motor strength 0/5 LUE 3-/5 LLE 5/5 on right side   Dependent edema left hand and wrist.  No hypersensitivity to touch.  No temperature change. -sensory exam normal. Prior neuro assessment is c/w today's exam 03/20/2024.  Skin- L PIP of 2nd digit- has scab     Musculoskeletal:  No  tenderness over the left wrist.   Assessment/Plan: 1. Functional deficits which require 3+ hours per day of interdisciplinary therapy in a comprehensive inpatient rehab setting. Physiatrist is providing close team supervision and 24 hour management of active medical problems listed below. Physiatrist and rehab team continue to assess barriers to discharge/monitor patient progress toward functional and medical goals  Care Tool:  Bathing    Body parts bathed by patient: Left arm, Chest, Abdomen, Front perineal area, Buttocks, Right upper leg, Left upper leg, Right lower leg, Left lower leg, Face   Body parts bathed by helper: Right arm Body parts n/a: Right arm   Bathing assist Assist Level: Minimal Assistance - Patient > 75%     Upper Body Dressing/Undressing Upper body dressing   What is the patient wearing?: Pull over shirt    Upper body assist Assist Level: Moderate Assistance - Patient 50 - 74%    Lower Body Dressing/Undressing Lower body dressing      What is the patient wearing?: Pants, Underwear/pull up     Lower body assist Assist for lower body dressing: Minimal Assistance - Patient > 75%     Editor, commissioning  assist Assist for toileting: Moderate Assistance - Patient 50 - 74%     Transfers Chair/bed transfer  Transfers assist     Chair/bed transfer assist level: Minimal Assistance - Patient > 75%     Locomotion Ambulation   Ambulation assist      Assist level: Minimal Assistance - Patient > 75% Assistive device: Walker-Eva Max distance: 150   Walk 10 feet activity   Assist     Assist level: Moderate Assistance - Patient - 50 - 74% Assistive device: Hand held assist   Walk 50 feet activity   Assist Walk 50 feet with 2 turns activity did not occur: Safety/medical concerns (L hemiplegia and pt fatigue)  Assist level: Moderate Assistance - Patient - 50 - 74% Assistive device: Hand held assist    Walk 150 feet  activity   Assist Walk 150 feet activity did not occur: Safety/medical concerns (L hemiplegia and pt fatigue)  Assist level: Minimal Assistance - Patient > 75% Assistive device: Walker-Eva    Walk 10 feet on uneven surface  activity   Assist Walk 10 feet on uneven surfaces activity did not occur: Safety/medical concerns (L hemiplegia and pt fatigue)         Wheelchair     Assist Is the patient using a wheelchair?: Yes Type of Wheelchair: Manual    Wheelchair assist level: Supervision/Verbal cueing Max wheelchair distance: 150    Wheelchair 50 feet with 2 turns activity    Assist        Assist Level: Supervision/Verbal cueing   Wheelchair 150 feet activity     Assist      Assist Level: Minimal Assistance - Patient > 75%   Blood pressure (!) 144/88, pulse 72, temperature 98 F (36.7 C), temperature source Oral, resp. rate 16, height 5\' 6"  (1.676 m), weight 64.4 kg, SpO2 96%.   Medical Problem List and Plan: 1. 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 Search for facility ongoing. May only be able to go to custodial care facility  D/c SNF- hopefully by end of week/early next week  -Continue CIR therapies including PT, OT, and SLP   Still hasn't received isotoner glove- they are wrapping for now    P2P denied- appealed by family.  2. DVT/anticoagulation:  Pharmaceutical: Lovenox              -antiplatelet therapy: Aspirin  81 mg daily and Plavix  75 mg daily x 3 weeks then Plavix  alone 3. Pain Management: Tylenol  as needed  3/25- denies pain- con't regimen rpn   3-30: Ongoing left hip pain; Voltaren  gel and Tylenol .    4/3- having new burning pain in LUE- which is seen sometimes with  stroke- will add Duloxetine  30 mg daily for nerve pain- monitor for side effects.  -if gets nausea, will change to Lyrica 4/4-6 no side effects so far-  4/8- will increase Duloxetine  to 60 mg daily  DC hydrocodone  due to side effects, trial tyl #3  as needed for pain that is not relieved by Tylenol . 4/28- advised pt to use voltaren  gel  on all sides of L hand including between fingers- use tylenol  #3 as needed 4/29- pt admits  doesn't like taking/using meds all the time- explained I don't have a "fix"- to make it better- have to take meds to have them work 4/30- encouraged again to use voltaren  4x/day for L hand 5/1- pt said using voltaren  gel and tylenol  #3 as needed- adding lidoderm  patches 2 patches at night for back pain 5/2- HA's- will restart SCHEDULED Topamax  75 mg at bedtime- not sure what occurred? Needs to get nightly 5/3 did better last night and this morning with topamax  4. Depression/Behavior/Sleep: Provide emotional support  4/21 mood improved--continue duloxetine              -antipsychotic agents: N/A 5. Neuropsych/cognition: This patient is capable of making decisions on her own behalf. 6. Skin/Wound Care: Routine skin checks 7. Fluids/Electrolytes/Nutrition:encouraging po.   4/21- I personally reviewed the patient's labs today.     -albumin still a bit low--encourage po/proteins. We have discussed improving intake 8.  Dysphagia.  Dysphagia 3 with thins currently  4/18 intake ok    9.  Hyperlipidemia.  Crestor  10.  Recent History of cocaine/tobacco use.  UDS positive for cocaine.  Provide counseling, this has been a chronic issue , seen in PCP notes 11.  Hypothyroidism. continue Synthroid   12.  Constipation.   Senokot-S 2 tab twice daily  4/23- LBM yesterday  4/24- LBM 2 days ago but feels like needs to go this AM  4/25- LBM yesterday  4/29- LBM yesterday  5/2- LBM 2 days ago 15. Blurry vision and dysconjugate gaze  3/27- will need Ophtho after d/c.   3-29: Seems to  mostly be from the left eye, with difficulty converging and tracking; messaged OT and PT to trial taping versus alternating eye patch to help with convergence.  3-30: Reporting improvement with use of taping  4/4- pt reports everything blurry- cannot see faces as a result- never had eye checked in past- used readers but cannot see at a distance since stroke- will send to Ophtho after d/c.   4/7- d/w pt again- she wants eyeglasses- explained we cannot get when in hospital- also per guidelines, since vision can still change, to not get for 3 months after stroke. She disbelieves it's from stroke, since had 2 prior- that didn't cause vision changes- we educated pt pn how this can occur.   4/25- reminded pt cannot get Optho in hospital- need to get once d/c'd  4/30 went over visual changes with pt- will need to get glasses, likely prisms 2-3 months after d/c once vision stabilizes 16.  Left hip pain.  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 Severe HA-daily HA's  4/15-16 sx appear improved with topamax , tylenol  helps also   -may use sx to get out of therapy at times?  4/23- Per team, pt having HA's frequently which impairs therapy- she won't discuss HA's with me- unclear if only has a day goes on vs self limting behavior  4/24- increase to 75 mg at bedtime  4/25- still having HA's, actually thinks a little worse since not taking Norco for it-   4/28-4/29 fewer complaints about HA this AM  4/30-5/1- - per staff, c/o these less- not interfering with therapy as much  5/2- Wasn't getting topamax - just taking T#3- wil restart  Topamax - don't know what occurred with order  5/3 see above 18. Decreased safety awareness/impulsiveness  4/30- still requiring max cues 20. Spasticity  4/22- off baclofen , and c/o LUE pain- wondering how much of her complaints are due to tone?-   4/30- Spasticity improved in LUE- MAS of 0 actually this AM 21. 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 .   4/30- pt pulled scab off-  22.  Urinary  and bowel incont due to CVA, cont toileting program 4/23- still cannot control B/B 4/30- on toileting program- is getting somewhat better, but still incontinent more at night   23. 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   4/29- will get Dopplers, because pt says she elevated hand overnight- still swollen- ordered  4/30- Dopplers (-)- will get isotoner compression glove  5/2-3 still waiting for glove- wrapping in spite of this        LOS: 40 days A FACE TO FACE EVALUATION WAS PERFORMED  Rawland Caddy 03/20/2024, 10:56 AM

## 2024-03-20 NOTE — Progress Notes (Signed)
 Occupational Therapy Note  Patient Details  Name: Theresa Watkins MRN: [id number] Date of Birth: June 03, 1966  Today's Date: 03/20/2024 OT Missed Time: 75 Minutes Missed Time Reason: Pain (patient reports stomach pain and diarrhea)  Patient was informed of the benefit of therapy in relation to improving upon her functional Ind and safety.  Nursing was made aware of the pt's refusal.    Moises Ang 03/20/2024, 12:27 PM

## 2024-03-20 NOTE — Plan of Care (Signed)
  Problem: Consults Goal: RH STROKE PATIENT EDUCATION Description: See Patient Education module for education specifics  Outcome: Progressing   Problem: RH SAFETY Goal: RH STG ADHERE TO SAFETY PRECAUTIONS W/ASSISTANCE/DEVICE Description: STG Adhere to Safety Precautions With  cues Assistance/Device. Outcome: Progressing   

## 2024-03-20 NOTE — Progress Notes (Signed)
 Speech Language Pathology Daily Session Note  Patient Details  Name: Theresa Watkins MRN: [id number] Date of Birth: 1966-07-31  Today's Date: 03/20/2024 SLP Individual Time: 0920-1000 SLP Individual Time Calculation (min): 40 min and Today's Date: 03/20/2024 SLP Missed Time: 20 Minutes Missed Time Reason: Patient fatigue;Other (Comment) (migraine)  Short Term Goals: Week 6: SLP Short Term Goal 1 (Week 6): STGs= LTGs due to ELOS  Skilled Therapeutic Interventions:   Pt greeted at bedside. She was asleep upon SLP and required modA cueing to wake. She reported minimal sleep last night, an upset stomach, and a migraine. She reported she was unable to participate - requested SLP return later. Throughout this conversation, she required minA to utilize compensatory speech strategies and maintain ~90% intelligibility. Anticipate fatigue negatively impacted success. Upon SLP return, she requested assistance to the bathroom. Despite a variety of background noise, pt was able to provide SLP w/ verbal instructions re toileting and transfer via steady.  She required only s cues to maintain 95% intelligibility throughout. Of note, she demonstrated adequate problem solving, sequencing, and safety awareness throughout toileting and was continent of bowel and bladder. Once back in bed, pt was left with the call light within reach and alarm set. Recommend cont ST.   Pain  Pt reported a migraine. Nursing aware. See EMR for more info re scheduled medications.   Therapy/Group: Individual Therapy  Rozell Cornet 03/20/2024, 3:08 PM

## 2024-03-21 DIAGNOSIS — G441 Vascular headache, not elsewhere classified: Secondary | ICD-10-CM | POA: Diagnosis not present

## 2024-03-21 DIAGNOSIS — M19041 Primary osteoarthritis, right hand: Secondary | ICD-10-CM | POA: Diagnosis not present

## 2024-03-21 DIAGNOSIS — I63511 Cerebral infarction due to unspecified occlusion or stenosis of right middle cerebral artery: Secondary | ICD-10-CM | POA: Diagnosis not present

## 2024-03-21 NOTE — Progress Notes (Signed)
 Occupational Therapy Session Note  Patient Details  Name: Theresa Watkins MRN: [id number] Date of Birth: 03-11-1966  Today's Date: 03/21/2024 OT Individual Time: 0250-0330 OT Individual Time Calculation (min): 40 min    Short Term Goals: Week 1:  OT Short Term Goal 1 (Week 1): Pt will complete PROM with LUE without VC OT Short Term Goal 1 - Progress (Week 1): Progressing toward goal OT Short Term Goal 2 (Week 1): Pt will use LUE as stabilizer during ADL tasks with Mod A OT Short Term Goal 2 - Progress (Week 1): Met OT Short Term Goal 3 (Week 1): Pt will complete shower transfer at Mod A with LRAD OT Short Term Goal 3 - Progress (Week 1): Progressing toward goal OT Short Term Goal 4 (Week 1): Pt will compplete UB dressing with himi technique at Mod A OT Short Term Goal 4 - Progress (Week 1): Met  Skilled Therapeutic Interventions/Progress Updates:     The pt was resting in bed at the time of arrival. The pt indicated that slept well and she had no pain to report at the time of treatment. The pt was able to come from supine in bed to EOB with MinA.  The pt was able to transfer from EOB to the w/c with MinA using the quad cane and the arm of the w/c.  The pt was transported to the gym and was able to use the 2lb dumb bell , 2sets of 10 with the  RUE for  bicep curls, horizontal abduction, and elbow extension.  The pt required 3 rest breaks. The pt tolerated retrograde massage of the LUE to alleviate the swelling of the extremity followed by the application of her glove and instruction in positioning of the extremity to reduce the swelling. At the end of the session, the pt was able to transfer back to bed LOF function with MinA and additional time.  The call light and bedside table were placed within reach with all additional needs addressed prior to exiting the room.   Therapy Documentation Precautions:  Precautions Precautions: Fall Precaution/Restrictions Comments: L  hemi Restrictions Weight Bearing Restrictions Per Provider Order: No  Therapy/Group: Individual Therapy  Moises Ang 03/21/2024, 4:28 PM

## 2024-03-21 NOTE — Progress Notes (Signed)
 Speech Language Pathology Daily Session Note  Patient Details  Name: Theresa Watkins MRN: [id number] Date of Birth: 24-Mar-1966  Today's Date: 03/21/2024 SLP Individual Time: 0924-1008 SLP Individual Time Calculation (min): 44 min  Short Term Goals: Week 6: SLP Short Term Goal 1 (Week 6): STGs= LTGs due to ELOS  Skilled Therapeutic Interventions:   Pt seen for skilled SLP intervention to address speech goals. SLP prompted practice of articulatory precision and vocal clarity during structured and unstructured therapeutic speech tasks including picture description and conversation. Overall speech intelligibility observed to be ~75%. Pt receptive to clear speech strategies and implemented with min cues. Pt left sitting up in bed with call bell in reach and bed alarm activated. Continue SLP PoC.   Pain Pain Assessment Pain Scale: 0-10 Pain Score: 0-No pain  Therapy/Group: Individual Therapy  Leverette Read 03/21/2024, 10:11 AM

## 2024-03-21 NOTE — Progress Notes (Signed)
 Physical Therapy Session Note  Patient Details  Name: Theresa Watkins MRN: [id number] Date of Birth: 10-Oct-1966  Today's Date: 03/21/2024 PT Individual Time: 1031-1111 PT Individual Time Calculation (min): 40 min   Short Term Goals: Week 5:  PT Short Term Goal 1 (Week 5): =LTGs d/t ELOS PT Short Term Goal 1 - Progress (Week 5): Met  Skilled Therapeutic Interventions/Progress Updates: Pt presents sitting in w/c and agreeable to therapy, although c/o HA but does not quantify.  Pt wheels self to dayroom, using B LES and RUE and supervision.  Pt transfers sit to stand w/ min A slowly.  Pt amb w/ HW and mod A w/ cueing for sequencing and manual facilitation/verbal cueing for weight shift R to advance LLE.  Pt requires cueing for weight acceptance to LLE and HW to advance RLE w/o buckling L knee.  Pt amb x 28' w/ occasional retro and cues for pressure on toes.  Pt amb x 2 trials then states need for BR.  Pt wheeled to room and performed SPT w/c to toilet w/ max A for clothing management.  Pt remained sitting on toilet and NT brought to room for supervision.  Missed time of 5' 2/2 toileting.     Therapy Documentation Precautions:  Precautions Precautions: Fall Precaution/Restrictions Comments: L hemi Restrictions Weight Bearing Restrictions Per Provider Order: No General: PT Amount of Missed Time (min): 5 Minutes PT Missed Treatment Reason: Toileting Vital Signs:   Pain:c/o HA Pain Assessment Pain Scale: 0-10 Pain Score: 0-No pain   Therapy/Group: Individual Therapy  Xander Jutras P Yanelie Abraha 03/21/2024, 11:15 AM

## 2024-03-21 NOTE — Progress Notes (Signed)
 PROGRESS NOTE   Subjective/Complaints:  Sleeping better, headaches better with scheduled topamax  back. No new issues today.   ROS: Patient denies fever, rash, sore throat, blurred vision, dizziness, nausea, vomiting, diarrhea, cough, shortness of breath or chest pain, joint or back/neck pain,   or mood change.    Objective:   No results found.     No results for input(s): "WBC", "HGB", "HCT", "PLT" in the last 72 hours.      No results for input(s): "NA", "K", "CL", "CO2", "GLUCOSE", "BUN", "CREATININE", "CALCIUM " in the last 72 hours.       Intake/Output Summary (Last 24 hours) at 03/21/2024 0849 Last data filed at 03/21/2024 0759 Gross per 24 hour  Intake 228.5 ml  Output --  Net 228.5 ml          Physical Exam: Vital Signs Blood pressure 116/85, pulse 72, temperature 98.3 F (36.8 C), temperature source Oral, resp. rate 18, height 5\' 6"  (1.676 m), weight 64.4 kg, SpO2 97%.        Constitutional: No distress . Vital signs reviewed. HEENT: NCAT, EOMI, oral membranes moist Neck: supple Cardiovascular: RRR without murmur. No JVD    Respiratory/Chest: CTA Bilaterally without wheezes or rales. Normal effort    GI/Abdomen: BS +, non-tender, non-distended Ext: no clubbing, cyanosis, or edema Psych: pleasant and cooperative  Neurological: Ox3-   MAS of 0 in LUE Extremities: L hand with isotoner glove in place, still somewhat tender. Swelling decreased  Motor strength 0/5 LUE 3-/5 LLE 5/5 on right side   Dependent edema left hand and wrist.  No hypersensitivity to touch.  No temperature change. -sensory exam normal. Prior neuro assessment is c/w today's exam 03/21/2024.  Skin- L PIP of 2nd digit- has scab     Musculoskeletal:  No tenderness over the left wrist.   Assessment/Plan: 1. Functional deficits which require 3+ hours per day of interdisciplinary therapy in a comprehensive inpatient rehab  setting. Physiatrist is providing close team supervision and 24 hour management of active medical problems listed below. Physiatrist and rehab team continue to assess barriers to discharge/monitor patient progress toward functional and medical goals  Care Tool:  Bathing    Body parts bathed by patient: Left arm, Chest, Abdomen, Front perineal area, Buttocks, Right upper leg, Left upper leg, Right lower leg, Left lower leg, Face   Body parts bathed by helper: Right arm Body parts n/a: Right arm   Bathing assist Assist Level: Minimal Assistance - Patient > 75%     Upper Body Dressing/Undressing Upper body dressing   What is the patient wearing?: Pull over shirt    Upper body assist Assist Level: Moderate Assistance - Patient 50 - 74%    Lower Body Dressing/Undressing Lower body dressing      What is the patient wearing?: Pants, Underwear/pull up     Lower body assist Assist for lower body dressing: Minimal Assistance - Patient > 75%     Toileting Toileting    Toileting assist Assist for toileting: Moderate Assistance - Patient 50 - 74%     Transfers Chair/bed transfer  Transfers assist     Chair/bed transfer assist level: Minimal Assistance - Patient >  75%     Locomotion Ambulation   Ambulation assist      Assist level: Minimal Assistance - Patient > 75% Assistive device: Walker-Eva Max distance: 150   Walk 10 feet activity   Assist     Assist level: Moderate Assistance - Patient - 50 - 74% Assistive device: Hand held assist   Walk 50 feet activity   Assist Walk 50 feet with 2 turns activity did not occur: Safety/medical concerns (L hemiplegia and pt fatigue)  Assist level: Moderate Assistance - Patient - 50 - 74% Assistive device: Hand held assist    Walk 150 feet activity   Assist Walk 150 feet activity did not occur: Safety/medical concerns (L hemiplegia and pt fatigue)  Assist level: Minimal Assistance - Patient > 75% Assistive  device: Walker-Eva    Walk 10 feet on uneven surface  activity   Assist Walk 10 feet on uneven surfaces activity did not occur: Safety/medical concerns (L hemiplegia and pt fatigue)         Wheelchair     Assist Is the patient using a wheelchair?: Yes Type of Wheelchair: Manual    Wheelchair assist level: Supervision/Verbal cueing Max wheelchair distance: 150    Wheelchair 50 feet with 2 turns activity    Assist        Assist Level: Supervision/Verbal cueing   Wheelchair 150 feet activity     Assist      Assist Level: Minimal Assistance - Patient > 75%   Blood pressure 116/85, pulse 72, temperature 98.3 F (36.8 C), temperature source Oral, resp. rate 18, height 5\' 6"  (1.676 m), weight 64.4 kg, SpO2 97%.   Medical Problem List and Plan: 1. 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 Search for facility ongoing. May only be able to go to custodial care facility  D/c SNF- hopefully by end of week/early next week  -Continue CIR therapies including PT, OT, and SLP  -has received isotoner glove for left hand    P2P denied- appealed by family.  2. DVT/anticoagulation:  Pharmaceutical: Lovenox              -antiplatelet therapy: Aspirin  81 mg daily and Plavix  75 mg daily x 3 weeks then Plavix  alone 3. Pain Management: Tylenol  as needed  3/25- denies pain- con't regimen rpn   3-30: Ongoing left hip pain; Voltaren  gel and Tylenol .    4/3- having new burning pain in LUE- which is seen sometimes with stroke- will add Duloxetine  30 mg daily for nerve pain- monitor for side effects.  -if gets nausea, will change to Lyrica 4/4-6 no side effects so far-  4/8- will increase Duloxetine  to 60 mg daily   DC hydrocodone  due to side effects, trial tyl #3  as needed for pain that is not relieved by Tylenol . 4/28- advised pt to use voltaren  gel  on all sides of L hand including between fingers- use tylenol  #3 as needed 4/29- pt admits doesn't like taking/using meds all the time- explained I don't have a "fix"- to make it better- have to take meds to have them work 4/30- encouraged again to use voltaren  4x/day for L hand 5/1- pt said using  voltaren  gel and tylenol  #3 as needed- adding lidoderm  patches 2 patches at night for back pain 5/2- HA's- will restart SCHEDULED Topamax  75 mg at bedtime- not sure what occurred? Needs to get nightly 5/3-4 appears improved with topamax  4. Depression/Behavior/Sleep: Provide emotional support  4/21 mood improved--continue duloxetine              -antipsychotic agents: N/A 5. Neuropsych/cognition: This patient is capable of making decisions on her own behalf. 6. Skin/Wound Care: Routine skin checks 7. Fluids/Electrolytes/Nutrition:encouraging po.   4/21- I personally reviewed the patient's labs today.     -albumin still a bit low--encourage po/proteins. We have discussed improving intake 8.  Dysphagia.  Dysphagia 3 with thins currently  4/18 intake ok    9.  Hyperlipidemia.  Crestor  10.  Recent History of cocaine/tobacco use.  UDS positive for cocaine.  Provide counseling, this has been a chronic issue , seen in PCP notes 11.  Hypothyroidism. continue Synthroid   12.  Constipation.   Senokot-S 2 tab twice daily  4/23- LBM yesterday  4/24- LBM 2 days ago but feels like needs to go this AM  4/25- LBM yesterday  4/29- LBM yesterday  5/2- LBM 2 days ago 15. Blurry vision and dysconjugate gaze  3/27- will need Ophtho after d/c.   3-29: Seems to mostly be from the left eye, with difficulty converging and tracking; messaged OT and PT to trial taping versus alternating eye patch to help with convergence.  3-30: Reporting improvement with use of taping  4/4- pt  reports everything blurry- cannot see faces as a result- never had eye checked in past- used readers but cannot see at a distance since stroke- will send to Ophtho after d/c.   4/7- d/w pt again- she wants eyeglasses- explained we cannot get when in hospital- also per guidelines, since vision can still change, to not get for 3 months after stroke. She disbelieves it's from stroke, since had 2 prior- that didn't cause vision changes- we educated pt pn how this can occur.   4/25- reminded pt cannot get Optho in hospital- need to get once d/c'd  4/30 went over visual changes with pt- will need to get glasses, likely prisms 2-3 months after d/c once vision stabilizes 16.  Left hip pain.  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 Severe HA-daily HA's  4/15-16 sx appear improved with topamax , tylenol  helps also   -may use sx to get out of therapy at times?  4/23- Per team, pt having HA's frequently which impairs therapy- she won't discuss HA's with me- unclear if only has a day goes on vs self limting behavior  4/24- increase to 75 mg at bedtime  4/25- still having HA's, actually thinks a little worse since not taking Norco for it-   4/28-4/29 fewer complaints about HA this AM  4/30-5/1- - per staff, c/o these less- not interfering with therapy as much  5/2- Wasn't getting topamax - just taking T#3- wil restart Topamax - don't know what occurred with order  5/4 see  #3 above 18. Decreased safety awareness/impulsiveness  4/30- still  requiring max cues 20. Spasticity  4/22- off baclofen , and c/o LUE pain- wondering how much of  her complaints are due to tone?-   4/30- Spasticity improved in LUE- MAS of 0 actually this AM 21. 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 .   4/30- pt pulled scab off-  22.  Urinary  and bowel incont due to CVA, cont toileting program 4/23- still cannot control B/B 4/30- on toileting program- is getting somewhat better, but still incontinent more at night   23. 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   4/29- will get Dopplers, because pt says she elevated hand overnight- still swollen- ordered  4/30- Dopplers (-)- will get isotoner compression glove  5/4 received glove        LOS: 41 days A FACE TO FACE EVALUATION WAS PERFORMED  Rawland Caddy 03/21/2024, 8:49 AM

## 2024-03-22 DIAGNOSIS — I63511 Cerebral infarction due to unspecified occlusion or stenosis of right middle cerebral artery: Secondary | ICD-10-CM | POA: Diagnosis not present

## 2024-03-22 LAB — CBC WITH DIFFERENTIAL/PLATELET
Abs Immature Granulocytes: 0.02 10*3/uL (ref 0.00–0.07)
Basophils Absolute: 0 10*3/uL (ref 0.0–0.1)
Basophils Relative: 0 %
Eosinophils Absolute: 0.1 10*3/uL (ref 0.0–0.5)
Eosinophils Relative: 2 %
HCT: 42.7 % (ref 36.0–46.0)
Hemoglobin: 14.1 g/dL (ref 12.0–15.0)
Immature Granulocytes: 0 %
Lymphocytes Relative: 34 %
Lymphs Abs: 1.7 10*3/uL (ref 0.7–4.0)
MCH: 28.5 pg (ref 26.0–34.0)
MCHC: 33 g/dL (ref 30.0–36.0)
MCV: 86.4 fL (ref 80.0–100.0)
Monocytes Absolute: 0.5 10*3/uL (ref 0.1–1.0)
Monocytes Relative: 9 %
Neutro Abs: 2.7 10*3/uL (ref 1.7–7.7)
Neutrophils Relative %: 55 %
Platelets: 160 10*3/uL (ref 150–400)
RBC: 4.94 MIL/uL (ref 3.87–5.11)
RDW: 13.3 % (ref 11.5–15.5)
WBC: 5.1 10*3/uL (ref 4.0–10.5)
nRBC: 0 % (ref 0.0–0.2)

## 2024-03-22 LAB — COMPREHENSIVE METABOLIC PANEL WITH GFR
ALT: 42 U/L (ref 0–44)
AST: 20 U/L (ref 15–41)
Albumin: 3.4 g/dL — ABNORMAL LOW (ref 3.5–5.0)
Alkaline Phosphatase: 55 U/L (ref 38–126)
Anion gap: 7 (ref 5–15)
BUN: 14 mg/dL (ref 6–20)
CO2: 22 mmol/L (ref 22–32)
Calcium: 8.6 mg/dL — ABNORMAL LOW (ref 8.9–10.3)
Chloride: 108 mmol/L (ref 98–111)
Creatinine, Ser: 0.74 mg/dL (ref 0.44–1.00)
GFR, Estimated: >60 mL/min (ref >60)
Glucose, Bld: 96 mg/dL (ref 70–99)
Potassium: 3.8 mmol/L (ref 3.5–5.1)
Sodium: 137 mmol/L (ref 135–145)
Total Bilirubin: 0.5 mg/dL (ref 0.0–1.2)
Total Protein: 6.2 g/dL — ABNORMAL LOW (ref 6.5–8.1)

## 2024-03-22 NOTE — Plan of Care (Signed)
  Problem: Consults Goal: RH STROKE PATIENT EDUCATION Description: See Patient Education module for education specifics  Outcome: Progressing   Problem: RH PAIN MANAGEMENT Goal: RH STG PAIN MANAGED AT OR BELOW PT'S PAIN GOAL Description: Pain managed < 4 with prns Outcome: Progressing

## 2024-03-22 NOTE — Progress Notes (Signed)
 Occupational Therapy Session Note  Patient Details  Name: Theresa Watkins MRN: [id number] Date of Birth: 20-Apr-1966  Today's Date: 03/22/2024 OT Missed Time: 30 Minutes Missed Time Reason: Patient fatigue;Pain (Pt reporting she is having stomach pain and loose BMs, requesting to go to sleep)   Skilled Therapeutic Interventions/Progress Updates:    Attempted to see Pt for PM OT session. Pt resting in bed presenting to be fatigued and requesting to go to sleep vs participate in session. Pt reporting stomach pain and that she has been having frequent BM's- nursing staff aware. Offered ADLs and bed level activities however Pt politely declining. Missed 30 minutes skilled OT treatment d/t stomach pain and fatigue. Will attempt to make up time as schedule and Pt's status allows.   Therapy Documentation Precautions:  Precautions Precautions: Fall Precaution/Restrictions Comments: L hemi Restrictions Weight Bearing Restrictions Per Provider Order: No General: General OT Amount of Missed Time: 30 Minutes   Therapy/Group: Individual Therapy  Geoffery Kiel 03/22/2024, 3:19 PM

## 2024-03-22 NOTE — Progress Notes (Signed)
 Patient refused voltaren  gel because she "does not like the feeling" on her skin. MD notified.

## 2024-03-22 NOTE — Progress Notes (Signed)
 Occupational Therapy Session Note  Patient Details  Name: Theresa Watkins MRN: [id number] Date of Birth: 1966/08/16  Today's Date: 03/22/2024 OT Individual Time: 1300-1345 OT Individual Time Calculation (min): 45 min    Short Term Goals: Week 6:  OT Short Term Goal 1 (Week 6): Pt will perform UB dressing tasks with min A OT Short Term Goal 2 (Week 6): Pt will perfrom toileting tasks with min A  Skilled Therapeutic Interventions/Progress Updates:    Pt sleeping upon arrival but easily aroused. Pt reports she does not feel well and declined getting OOB. Pt had not eaten lunch and agreeable to eating some lunch. Pt assisted with repositioning in bed prior to eating. Pt continues to require supervision for eating 2/2 swallowing deficits. Pt requires min verbal cues for strategies and to not talk while eating. One episode of food "caught in throat" but pt able to dislodge. Pt able to feed self with supervision using dominant hand. Pt reamined in bed with all needs wihtin reach. Bed alarm activated. Pt agreeable to shower tomorrow morning.   Therapy Documentation Precautions:  Precautions Precautions: Fall Precaution/Restrictions Comments: L hemi Restrictions Weight Bearing Restrictions Per Provider Order: No Pain: Pt denies pain but reports she "does not feel well." RN aware  Therapy/Group: Individual Therapy  [name] 03/22/2024, 2:55 PM

## 2024-03-22 NOTE — Progress Notes (Signed)
 PROGRESS NOTE   Subjective/Complaints:  Pt reports L hand is "killing her" this AM Even though wearing glove AND WHO.   LBM around 2am.   No other issues.    ROS:  Pt denies SOB, abd pain, CP, N/V/C/D, and vision changes   Objective:   No results found.     Recent Labs    03/22/24 0523  WBC 5.1  HGB 14.1  HCT 42.7  PLT 160        Recent Labs    03/22/24 0523  NA 137  K 3.8  CL 108  CO2 22  GLUCOSE 96  BUN 14  CREATININE 0.74  CALCIUM  8.6*        No intake or output data in the 24 hours ending 03/22/24 0816         Physical Exam: Vital Signs Blood pressure 101/77, pulse 77, temperature 98.6 F (37 C), temperature source Oral, resp. rate 18, height 5\' 6"  (1.676 m), weight 64.4 kg, SpO2 95%.         General: awake, alert, appropriate, curled up in covers/pillows; NAD HENT: conjugate gaze; oropharynx moist CV: regular rate and rhythm; no JVD Pulmonary: CTA B/L; no W/R/R- good air movement GI: soft, NT, ND, (+)BS Psychiatric: appropriate Neurological: alert- dysarthric- harder to understand in early AM MSK- wearing glove and WHO in L hand- still moderate swelling   MAS of 0 in LUE Extremities: L hand with isotoner glove in place, still somewhat tender. Swelling decreased  Motor strength 0/5 LUE 3-/5 LLE 5/5 on right side   Dependent edema left hand and wrist.  No hypersensitivity to touch.  No temperature change. -sensory exam normal. Prior neuro assessment is c/w today's exam 03/22/2024.  Skin- L PIP of 2nd digit- has scab     Musculoskeletal:  No tenderness over the left wrist.   Assessment/Plan: 1. Functional deficits which require 3+ hours per day of interdisciplinary therapy in a comprehensive inpatient rehab setting. Physiatrist is providing close team supervision and 24 hour management of active medical problems listed below. Physiatrist and rehab team continue  to assess barriers to discharge/monitor patient progress toward functional and medical goals  Care Tool:  Bathing    Body parts bathed by patient: Left arm, Chest, Abdomen, Front perineal area, Buttocks, Right upper leg, Left upper leg, Right lower leg, Left lower leg, Face   Body parts bathed by helper: Right arm Body parts n/a: Right arm   Bathing assist Assist Level: Minimal Assistance - Patient > 75%     Upper Body Dressing/Undressing Upper body dressing   What is the patient wearing?: Pull over shirt    Upper body assist Assist Level: Moderate Assistance - Patient 50 - 74%    Lower Body Dressing/Undressing Lower body dressing      What is the patient wearing?: Pants, Underwear/pull up     Lower body assist Assist for lower body dressing: Minimal Assistance - Patient > 75%     Toileting Toileting    Toileting assist Assist for toileting: Maximal Assistance - Patient 25 - 49%     Transfers Chair/bed transfer  Transfers assist     Chair/bed transfer assist level:  Minimal Assistance - Patient > 75%     Locomotion Ambulation   Ambulation assist      Assist level: Moderate Assistance - Patient 50 - 74% Assistive device: Walker-hemi Max distance: 28   Walk 10 feet activity   Assist     Assist level: Moderate Assistance - Patient - 50 - 74% Assistive device: Walker-hemi   Walk 50 feet activity   Assist Walk 50 feet with 2 turns activity did not occur: Safety/medical concerns (L hemiplegia and pt fatigue)  Assist level: Moderate Assistance - Patient - 50 - 74% Assistive device: Hand held assist    Walk 150 feet activity   Assist Walk 150 feet activity did not occur: Safety/medical concerns (L hemiplegia and pt fatigue)  Assist level: Minimal Assistance - Patient > 75% Assistive device: Walker-Eva    Walk 10 feet on uneven surface  activity   Assist Walk 10 feet on uneven surfaces activity did not occur: Safety/medical concerns (L  hemiplegia and pt fatigue)         Wheelchair     Assist Is the patient using a wheelchair?: Yes Type of Wheelchair: Manual    Wheelchair assist level: Supervision/Verbal cueing Max wheelchair distance: 100    Wheelchair 50 feet with 2 turns activity    Assist        Assist Level: Supervision/Verbal cueing   Wheelchair 150 feet activity     Assist      Assist Level: Minimal Assistance - Patient > 75%   Blood pressure 101/77, pulse 77, temperature 98.6 F (37 C), temperature source Oral, resp. rate 18, height 5\' 6"  (1.676 m), weight 64.4 kg, SpO2 95%.   Medical Problem List and Plan: 1. 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 Search for facility ongoing. May only be able to go to custodial care facility  D/c SNF- hopefully by end of week/early next week  Con't CIR  -has received isotoner glove for left hand P2P denied- appealed by family.  2. DVT/anticoagulation:  Pharmaceutical: Lovenox              -antiplatelet therapy: Aspirin  81 mg daily and Plavix  75 mg daily x 3 weeks then Plavix  alone 3. Pain Management: Tylenol  as needed  3/25- denies pain- con't regimen rpn   3-30: Ongoing left hip pain; Voltaren  gel and Tylenol .    4/3- having new burning pain in LUE- which is seen sometimes with stroke- will add Duloxetine  30 mg daily for nerve pain- monitor for side effects.  -if gets nausea, will change to Lyrica 4/4-6 no side effects so far-  4/8- will increase Duloxetine  to 60 mg daily  DC hydrocodone  due to side effects, trial tyl #3  as needed for pain that is not relieved by Tylenol . 4/28- advised pt to use voltaren  gel  on all sides of L hand including between fingers- use tylenol  #3 as  needed 4/29- pt admits doesn't like taking/using meds all the time- explained I don't have a "fix"- to make it better- have to take meds to have them work 4/30- encouraged again to use voltaren  4x/day for L hand 5/1- pt said using voltaren  gel and tylenol  #3  as needed- adding lidoderm  patches 2 patches at night for back pain 5/2- HA's- will restart SCHEDULED Topamax  75 mg at bedtime- not sure what occurred? Needs to get nightly 5/3-4 appears improved with topamax  5/5- Will cont' topamax - hand still bothering her- not clear what type of pain- but tylenol  helps some.  4. Depression/Behavior/Sleep: Provide emotional support  4/21 mood improved--continue duloxetine              -antipsychotic agents: N/A 5. Neuropsych/cognition: This patient is capable of making decisions on her own behalf. 6. Skin/Wound Care: Routine skin checks 7. Fluids/Electrolytes/Nutrition:encouraging po.   4/21- I personally reviewed the patient's labs today.     -albumin still a bit low--encourage po/proteins. We have discussed improving intake 8.  Dysphagia.  Dysphagia 3 with thins currently  4/18 intake ok    9.  Hyperlipidemia.  Crestor  10.  Recent History of cocaine/tobacco use.  UDS positive for cocaine.  Provide counseling, this has been a chronic issue , seen in PCP notes 11.  Hypothyroidism. continue Synthroid   12.  Constipation.   Senokot-S 2 tab twice daily  4/23- LBM yesterday  4/24- LBM 2 days ago but feels like needs to go this AM  4/25- LBM yesterday  4/29- LBM yesterday  5/2- LBM 2 days ago  5/5- LBM overnight/this AM 15. Blurry vision and dysconjugate gaze  3/27- will need Ophtho after d/c.   3-29: Seems to mostly be from the left eye, with difficulty converging and tracking; messaged OT and PT to trial taping versus alternating eye patch to help with convergence.  3-30: Reporting improvement with use of taping  4/4- pt reports everything blurry- cannot see faces as a result- never had eye checked  in past- used readers but cannot see at a distance since stroke- will send to Ophtho after d/c.   4/7- d/w pt again- she wants eyeglasses- explained we cannot get when in hospital- also per guidelines, since vision can still change, to not get for 3 months after stroke. She disbelieves it's from stroke, since had 2 prior- that didn't cause vision changes- we educated pt pn how this can occur.   4/25- reminded pt cannot get Optho in hospital- need to get once d/c'd  4/30 went over visual changes with pt- will need to get glasses, likely prisms 2-3 months after d/c once vision stabilizes 16.  Left hip pain.  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 Severe HA-daily HA's  4/15-16 sx appear improved with topamax , tylenol  helps also   -may use sx to get out of therapy at times?  4/23- Per team, pt having HA's frequently which impairs therapy- she won't discuss HA's with me- unclear if only has a day goes on vs self limting behavior  4/24- increase to 75 mg at bedtime  4/25- still having HA's, actually thinks a little worse since not taking Norco for it-   4/28-4/29 fewer complaints about HA this AM  4/30-5/1- - per staff, c/o these less- not interfering with therapy as much  5/2- Wasn't getting topamax - just taking T#3- wil restart Topamax -  don't know what occurred with order  5/4 see  #3 above 18. Decreased safety awareness/impulsiveness  4/30- still requiring max cues 20. Spasticity  4/22- off baclofen , and c/o LUE pain- wondering how much of her complaints are due to tone?-   4/30- Spasticity improved in LUE- MAS of 0  actually this AM 21. 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 .   4/30- pt pulled scab off-  22.  Urinary  and bowel incont due to CVA, cont toileting program 4/23- still cannot control B/B 4/30- on toileting program- is getting somewhat better, but still incontinent more at night   23. 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   4/29- will get Dopplers, because pt says she elevated hand overnight- still swollen- ordered  4/30- Dopplers (-)- will get isotoner compression glove  5/4 received glove        LOS: 42 days A FACE TO FACE EVALUATION WAS PERFORMED  Yomar Mejorado 03/22/2024, 8:16 AM

## 2024-03-22 NOTE — Progress Notes (Signed)
 Speech Language Pathology Daily Session Note  Patient Details  Name: Theresa Watkins MRN: [id number] Date of Birth: 10-Apr-1966  Today's Date: 03/22/2024 SLP Individual Time: 0900-0947 SLP Individual Time Calculation (min): 47 min  Short Term Goals: Week 6: SLP Short Term Goal 1 (Week 6): STGs= LTGs due to ELOS  Skilled Therapeutic Interventions:  Patient was seen in am to address speech intelligibility and dysphagia management. Pt was alert upon SLP arrival and seated upright consuming breakfast meal with NT present. SLP appreciated prior SLP notes with report that pt was upgraded to regular solids. SLP observing pt tolerance of current diet with pt administering large bites at a time and talking during meal. Pt responsive to SLP cueing to modify bolus size and reduce talking. Pt concluded meal with no s/s aspiration and left upright for remainder of session. Pt reports "excellent" speech intelligibility over the weekend which she related to getting enough sleep. Unfortunately she reported poor sleep the prior night. SLP engaged pt in structured task with pt maintaining speech intelligibility at 90% with min A for over articulation and pausing between words. During informal exchanges, pt's speech intelligibility reduced to ~80% warranting intermittent cues from SLP to carryover strategies. Pt left upright in bed at conclusion of session with bed alarm active and call button within reach. SLP to continue POC.   Pain Pain Assessment Pain Scale: 0-10 Pain Score: 5  Pain Location: Hand Pain Orientation: Left Pain Descriptors / Indicators: Aching 2nd Pain Site Pain Intervention(s): Distraction  Therapy/Group: Individual Therapy  Adela Holter 03/22/2024, 12:18 PM

## 2024-03-23 DIAGNOSIS — I63511 Cerebral infarction due to unspecified occlusion or stenosis of right middle cerebral artery: Secondary | ICD-10-CM | POA: Diagnosis not present

## 2024-03-23 MED ORDER — DULOXETINE HCL 60 MG PO CPEP
120.0000 mg | ORAL_CAPSULE | Freq: Every day | ORAL | Status: DC
  Administered 2024-03-24 – 2024-04-19 (×27): 120 mg via ORAL
  Filled 2024-03-23 (×28): qty 2

## 2024-03-23 NOTE — Progress Notes (Signed)
 Occupational Therapy Session Note  Patient Details  Name: Theresa Watkins MRN: [id number] Date of Birth: Feb 05, 1966  Today's Date: 03/23/2024 OT Individual Time: 4132-4401 OT Individual Time Calculation (min): 58 min    Short Term Goals: Week 6:  OT Short Term Goal 1 (Week 6): Pt will perform UB dressing tasks with min A OT Short Term Goal 2 (Week 6): Pt will perfrom toileting tasks with min A  Skilled Therapeutic Interventions/Progress Updates:    Skilled OT intervention with focus on functional transfers, toileting, and bathing at shower level. All squat pivot transfers with min A during session. Toileting with mod A.Pt completed toileting hygiene with CGA.  Bathing with min A seated on TTB. Min verbal cues for safety awareness. Pt initializing LUE to assist with bathing RUE but pt required assistance to wash upper arm. Pt also used LUE to assist with washing upper legs. Pt returned to EOB for dressing tasks. NT assisted with dressing 2/2 time constratints. Handoff to NT.  Therapy Documentation Precautions:  Precautions Precautions: Fall Precaution/Restrictions Comments: L hemi Restrictions Weight Bearing Restrictions Per Provider Order: No   Pain:  Pt denies pain this morning; RUE/hand edema improved   Therapy/Group: Individual Therapy  [name] 03/23/2024, 10:31 AM

## 2024-03-23 NOTE — Progress Notes (Signed)
 Patient ID: Theresa Watkins, female   DOB: [birth year], 58 y.o.   MRN: [id number]  SW met with pt in room to inform on efforts being made to get an extension from insurance, and SW will follow-up once there is more information.   *SW received fax from insurance reporting that appeal was denied in which it is reported that pt is getting better, and does not need much assistance up to 69ft. Pt is now no longer covered from 4/28-5/2. SW waiting on follow-up from pt son to discuss above.  SW left message for K[facility] CAP/DA dept and waiting on follow-up.   SW spoke with pt son Vonni to inform on above. SW emailed him denial information. SW shared with him that SW will need to file APS referral and will send out SNF referral again to see if anyone able to  accept. SW shared if no answers locally will have to move to outside county. He will follow-up if questions, SW will follow-up if there are updates.   1518- SW left message for [facility] APS to discuss concerns.   Theresa Watkins, MSW, LCSW Office: [phone] Cell: [phone] Fax: [phone]

## 2024-03-23 NOTE — Progress Notes (Signed)
 PROGRESS NOTE   Subjective/Complaints:  Pt reports feeling better today- kept glove on yesterday all day.  Feels like pins sticking into L hand- nerve pain-  Also reports vision "comes and goes" Can see but things blurry.  Nauseated yesterday, but resolved today.  ROS:   Pt denies SOB, abd pain, CP, N/V/C/D, and vision changes   Objective:   No results found.     Recent Labs    03/22/24 0523  WBC 5.1  HGB 14.1  HCT 42.7  PLT 160        Recent Labs    03/22/24 0523  NA 137  K 3.8  CL 108  CO2 22  GLUCOSE 96  BUN 14  CREATININE 0.74  CALCIUM  8.6*         Intake/Output Summary (Last 24 hours) at 03/23/2024 0936 Last data filed at 03/22/2024 1424 Gross per 24 hour  Intake 400 ml  Output --  Net 400 ml           Physical Exam: Vital Signs Blood pressure 107/81, pulse 91, temperature 98.4 F (36.9 C), resp. rate 18, height 5\' 6"  (1.676 m), weight 64.4 kg, SpO2 95%.          General: awake, alert, appropriate, curled up in blankets;  NAD HENT: conjugate gaze; oropharynx moist CV: regular rate and rhythm; no JVD Pulmonary: CTA B/L; no W/R/R- good air movement GI: soft, NT, ND, (+)BS Psychiatric: appropriate- brighter affect this AM Neurological: alert- dysarthric- harder to understand in early AM MSK- swelling ~ 40-50% better after glove- cannot make fist at all-  MAS of 0 in LUE Extremities: L hand with isotoner glove in place, still somewhat tender. Swelling decreased  Motor strength 0/5 LUE 3-/5 LLE 5/5 on right side   Dependent edema left hand and wrist.  No hypersensitivity to touch.  No temperature change. -sensory exam normal. Prior neuro assessment is c/w today's exam 03/23/2024.  Skin- L PIP of 2nd digit- has scab     Musculoskeletal:  No tenderness over the left wrist.   Assessment/Plan: 1. Functional deficits which require 3+ hours per day of interdisciplinary  therapy in a comprehensive inpatient rehab setting. Physiatrist is providing close team supervision and 24 hour management of active medical problems listed below. Physiatrist and rehab team continue to assess barriers to discharge/monitor patient progress toward functional and medical goals  Care Tool:  Bathing    Body parts bathed by patient: Left arm, Chest, Abdomen, Front perineal area, Buttocks, Right upper leg, Left upper leg, Right lower leg, Left lower leg, Face   Body parts bathed by helper: Right arm Body parts n/a: Right arm   Bathing assist Assist Level: Minimal Assistance - Patient > 75%     Upper Body Dressing/Undressing Upper body dressing   What is the patient wearing?: Pull over shirt    Upper body assist Assist Level: Moderate Assistance - Patient 50 - 74%    Lower Body Dressing/Undressing Lower body dressing      What is the patient wearing?: Pants, Underwear/pull up     Lower body assist Assist for lower body dressing: Minimal Assistance - Patient > 75%  Toileting Toileting    Toileting assist Assist for toileting: Maximal Assistance - Patient 25 - 49%     Transfers Chair/bed transfer  Transfers assist     Chair/bed transfer assist level: Minimal Assistance - Patient > 75%     Locomotion Ambulation   Ambulation assist      Assist level: Moderate Assistance - Patient 50 - 74% Assistive device: Walker-hemi Max distance: 28   Walk 10 feet activity   Assist     Assist level: Moderate Assistance - Patient - 50 - 74% Assistive device: Walker-hemi   Walk 50 feet activity   Assist Walk 50 feet with 2 turns activity did not occur: Safety/medical concerns (L hemiplegia and pt fatigue)  Assist level: Moderate Assistance - Patient - 50 - 74% Assistive device: Hand held assist    Walk 150 feet activity   Assist Walk 150 feet activity did not occur: Safety/medical concerns (L hemiplegia and pt fatigue)  Assist level: Minimal  Assistance - Patient > 75% Assistive device: Walker-Eva    Walk 10 feet on uneven surface  activity   Assist Walk 10 feet on uneven surfaces activity did not occur: Safety/medical concerns (L hemiplegia and pt fatigue)         Wheelchair     Assist Is the patient using a wheelchair?: Yes Type of Wheelchair: Manual    Wheelchair assist level: Supervision/Verbal cueing Max wheelchair distance: 100    Wheelchair 50 feet with 2 turns activity    Assist        Assist Level: Supervision/Verbal cueing   Wheelchair 150 feet activity     Assist      Assist Level: Minimal Assistance - Patient > 75%   Blood pressure 107/81, pulse 91, temperature 98.4 F (36.9 C), resp. rate 18, height 5\' 6"  (1.676 m), weight 64.4 kg, SpO2 95%.   Medical Problem List and Plan: 1. 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 Search for facility ongoing. May only be able to go to custodial care facility  D/c SNF- hopefully by end of week/early next week  Con't CIR PT and OT and SLP  Encouraged pt to continue to wear glove on L hand to help swelling/which can help overall pain.   Team conference to f/u on progress P2P denied- appealed by family.  2. DVT/anticoagulation:  Pharmaceutical: Lovenox              -antiplatelet therapy: Aspirin  81 mg daily and Plavix  75 mg daily x 3 weeks then Plavix  alone 3. Pain Management: Tylenol  as needed  3/25- denies pain- con't regimen rpn   3-30: Ongoing left hip pain; Voltaren  gel and Tylenol .    4/3- having new burning pain in LUE- which is seen sometimes with stroke- will add Duloxetine  30 mg daily for nerve pain- monitor for side effects.  -if gets nausea, will change to  Lyrica 4/4-6 no side effects so far-  4/8- will increase Duloxetine  to 60 mg daily  DC hydrocodone  due to side effects, trial tyl #3  as needed for pain that is not relieved by Tylenol . 4/28- advised pt to use voltaren  gel  on all sides of L hand  including between fingers- use tylenol  #3 as needed 4/29- pt admits doesn't like taking/using meds all the time- explained I don't have a "fix"- to make it better- have to take meds to have them work 4/30- encouraged again to use voltaren  4x/day for L hand 5/1- pt said using voltaren  gel and tylenol  #3 as needed- adding lidoderm  patches 2 patches at night for back pain 5/2- HA's- will restart SCHEDULED Topamax  75 mg at bedtime- not sure what occurred? Needs to get nightly 5/3-4 appears improved with topamax  5/5- Will cont' topamax - hand still bothering her- not clear what type of pain- but tylenol  helps some.  5/6- will increase Duloxetine  to 120 mg daily to help with nerve pain and mood 4. Depression/Behavior/Sleep: Provide emotional support  4/21 mood improved--continue duloxetine              -antipsychotic agents: N/A 5. Neuropsych/cognition: This patient is capable of making decisions on her own behalf. 6. Skin/Wound Care: Routine skin checks 7. Fluids/Electrolytes/Nutrition:encouraging po.   4/21- I personally reviewed the patient's labs today.     -albumin still a bit low--encourage po/proteins. We have discussed improving intake 8.  Dysphagia.  Dysphagia 3 with thins currently  4/18 intake ok    9.  Hyperlipidemia.  Crestor  10.  Recent History of cocaine/tobacco use.  UDS positive for cocaine.  Provide counseling, this has been a chronic issue , seen in PCP notes 11.  Hypothyroidism. continue Synthroid   12.  Constipation.   Senokot-S 2 tab twice daily  5/6- LBM yesterday even though chart says 5/3 15. Blurry vision and dysconjugate gaze  3/27- will need Ophtho after d/c.   3-29: Seems to mostly be from the left eye, with difficulty  converging and tracking; messaged OT and PT to trial taping versus alternating eye patch to help with convergence.  3-30: Reporting improvement with use of taping  4/4- pt reports everything blurry- cannot see faces as a result- never had eye checked in past- used readers but cannot see at a distance since stroke- will send to Ophtho after d/c.   4/7- d/w pt again- she wants eyeglasses- explained we cannot get when in hospital- also per guidelines, since vision can still change, to not get for 3 months after stroke. She disbelieves it's from stroke, since had 2 prior- that didn't cause vision changes- we educated pt pn how this can occur.   4/25- reminded pt cannot get Optho in hospital- need to get once d/c'd  4/30 went over visual changes with pt- will need to get glasses, likely prisms 2-3 months after d/c once vision stabilizes  5/6- d/w pt again- she wants to see Optho- explained they do not come into hospital for this 16.  Left hip pain.  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 Severe HA-daily HA's  4/15-16 sx appear improved with topamax , tylenol  helps also   -may use sx to get out of therapy at times?  4/23- Per team, pt having HA's frequently which impairs therapy- she won't discuss HA's with me- unclear if only has a day goes on vs  self limting behavior  4/24- increase to 75 mg at bedtime  4/25- still having HA's, actually thinks a little worse since not taking Norco for it-   4/28-4/29 fewer complaints about HA this AM  4/30-5/1- - per staff, c/o these less- not interfering with therapy as much  5/2- Wasn't  getting topamax - just taking T#3- wil restart Topamax - don't know what occurred with order  5/6- HA's doing better 18. Decreased safety awareness/impulsiveness  4/30- still requiring max cues 20. Spasticity  4/22- off baclofen , and c/o LUE pain- wondering how much of her complaints are due to tone?-   4/30- Spasticity improved in LUE- MAS of 0 actually this AM 21. 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 .   4/30- pt pulled scab off-  22.  Urinary  and bowel incont due to CVA, cont toileting program 4/23- still cannot control B/B 4/30- on toileting program- is getting somewhat better, but still incontinent more at night   23. 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   4/29- will get Dopplers, because pt says she elevated hand overnight- still swollen- ordered  4/30- Dopplers (-)- will get isotoner compression glove  5/4 received glove  5/6- Swelling ~ 50% better- will increase Duloxetine  to 120 mg daily for nerve pain in L hand and mood.   I spent a total of 38   minutes on total care today- >50% coordination of care- due to  D/w pt about vision, L hand pain and swelling and team conference about her dispo as well as f/u on progress      LOS: 43 days A FACE TO FACE EVALUATION WAS PERFORMED  Jalecia Leon 03/23/2024, 9:36 AM

## 2024-03-23 NOTE — Plan of Care (Signed)
  Problem: Consults Goal: RH SPINAL CORD INJURY PATIENT EDUCATION Description:  See Patient Education module for education specifics.  Outcome: Progressing   Problem: SCI BOWEL ELIMINATION Goal: RH STG MANAGE BOWEL WITH ASSISTANCE Description: STG Manage Bowel with supervision Assistance. Outcome: Progressing   Problem: SCI BLADDER ELIMINATION Goal: RH STG MANAGE BLADDER WITH ASSISTANCE Description: STG Manage Bladder With  supervision Assistance Outcome: Progressing   Problem: RH SKIN INTEGRITY Goal: RH STG SKIN FREE OF INFECTION/BREAKDOWN Description: Manage skin free of infection/breakdown with supervision assistance Outcome: Progressing   Problem: RH SAFETY Goal: RH STG ADHERE TO SAFETY PRECAUTIONS W/ASSISTANCE/DEVICE Description: STG Adhere to Safety Precautions With  supervision Assistance/Device. Outcome: Progressing   Problem: RH PAIN MANAGEMENT Goal: RH STG PAIN MANAGED AT OR BELOW PT'S PAIN GOAL Description: <4 w/ prns Outcome: Progressing   Problem: RH KNOWLEDGE DEFICIT SCI Goal: RH STG INCREASE KNOWLEDGE OF SELF CARE AFTER SCI Description: Manage increase knowledge of self care after SCI with supervision from mother using educational materials provided Outcome: Progressing

## 2024-03-23 NOTE — Progress Notes (Signed)
 Physical Therapy Session Note  Patient Details  Name: Theresa Watkins MRN: [id number] Date of Birth: Feb 18, 1966  Today's Date: 03/23/2024 PT Individual Time: 1445-1516 PT Individual Time Calculation (min): 31 min   Short Term Goals: Week 6:  PT Short Term Goal 1 (Week 6): pt will ambulate 100 ft with hemi walker PT Short Term Goal 2 (Week 6): pt will perform bed/chair transfers with CGA both directions  Skilled Therapeutic Interventions/Progress Updates: Pt presents semi-reclined in bed and agreeable to therapy although states" I'm weak from yesterday."  Pt required min A to transfer to EOB sitting from almost upright w/ bed features.  Pt sat EOB for Total A to don shoes.  Pt required mod A for sit to stand w/ cues for R hand placement.  Pt amb w/ HW and mod A and facilitation for weight shift R to advance LLE, verbal cues for sequencing.  Pt just "plopping" in w/c even w/ cues for hand placement.  Pt wheeled x 50' w/ R hemi technique before fatiguing and PT completed to Dayroom.  Pt performed sit to stand w/ mod A and performed partial squats x 8 before c/o need for use of BR.  Pt wheeled to room and into BR.  Pt performed sit to stand w/ min A, pulling from hand rail and then mod A for turning to toilet.  Pt required mod A for clothing management.  Pt remained sitting on toilet, NT and nurse aware of and pt will pull rope when completed.  Missed time of 58' 2/2 toileting.     Therapy Documentation Precautions:  Precautions Precautions: Fall Precaution/Restrictions Comments: L hemi Restrictions Weight Bearing Restrictions Per Provider Order: No General: PT Amount of Missed Time (min): 14 Minutes PT Missed Treatment Reason: Toileting Vital Signs: Therapy Vitals Temp: 98.6 F (37 C) Pulse Rate: 84 Resp: 18 BP: 116/80 Patient Position (if appropriate): Lying Oxygen Therapy SpO2: 94 % O2 Device: Room Air Pain:0/10     Therapy/Group: Individual Therapy  Elna Radovich P  Sammuel Blick 03/23/2024, 3:30 PM

## 2024-03-23 NOTE — Plan of Care (Signed)
  Problem: Consults Goal: RH STROKE PATIENT EDUCATION Description: See Patient Education module for education specifics  Outcome: Progressing   Problem: RH BOWEL ELIMINATION Goal: RH STG MANAGE BOWEL WITH ASSISTANCE Description: STG Manage Bowel with toileting Assistance. Outcome: Progressing

## 2024-03-23 NOTE — Patient Care Conference (Signed)
 Inpatient RehabilitationTeam Conference and Plan of Care Update Date: 03/23/2024   Time: 1102 am    Patient Name: Theresa Watkins      Medical Record Number: [id number]  Date of Birth: 29-May-1966 Sex: Female         Room/Bed: 4W01C/4W01C-01 Payor Info: Payor: [place] MEDICAID PREPAID HEALTH PLAN / Plan: [place] MEDICAID HEALTHY BLUE / Product Type: *No Product type* /    Admit Date/Time:  02/09/2024  6:29 PM  Primary Diagnosis:  Right middle cerebral artery stroke [facility])  Hospital Problems: Principal Problem:   Right middle cerebral artery stroke (HCC) Active Problems:   Polysubstance abuse [facility])    Expected Discharge Date: Expected Discharge Date:  (pending)  Team Members Present: Physician leading conference: Dr. Celia Coles Nurse Present: Jerene Monks, RN PT Present: Gita Lamb, PT OT Present: [name], OT;Jackqueline Mason, COTA SLP Present: Jenney Modest, SLP PPS Coordinator present : Jestine Moron, SLP     Current Status/Progress Goal Weekly Team Focus  Bowel/Bladder      Incontinent of bowel and bladder    Continent of bowel and bladder     Assess bowel and bladde q shift  Swallow/Nutrition/ Hydration   upgraded to regular   min A  carryover of comepnsatory strategies, tolerance of regular diet    ADL's   bathing-min A; LB dressing-mod A; UB dressing-min a; transfers-min A; verbal cues for safety awareness and hemi dressing techniques   min A overall; LB dressing downgraded to mod A; dynamic standing balance downgraded to min A; anticipatory awareness downgraded to mod A   safety awareness, ADL retraining, funcitonal transfers    Mobility   min assist transfers, mod A gait, S bed mobility   supervision bed<>chair, min a short distance gait, mod i w/c  gait, transfers, NMR, balance    Communication   mixed dysarthia with unfamiliar listneers judging speech ~75% and familiar listeners ~90% depending on variable   min A   carryover of comepnsatory strategies     Safety/Cognition/ Behavioral Observations               Pain      Headache / nerve pain LLE    <4 w/ prns    Assess pain q shift  Skin      No skin issues   Skin free from infection    Assess skin q shift    Discharge Planning:  Pt is SNF placement. Discharge pending extension in CIR, and SNF bed offer and insurance approval. Limited SNF options due to insurnace. SW will confirm there are no barriers to discharge.    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 Patient limited with nerve pain left lower extremity, headaches: medications adjusted by MD. Patient limited by  impulsitivity, spasticity, needing max cues with gait training, poor carry over , poor safety awareness and mixed dysarthria.   Patient on target to meet rehab goals: yes, Patient initiating with ADLS. Patient requires min assists with UB care. Patient requires mod assist with lower body care. Patient requires min assist with transfers and mod assist with ambulation using EVA or hemi walker. Overall goals at discharge are set for minimal assist.  *See Care Plan and progress notes for long and short-term goals.   Revisions to Treatment Plan:  Hemi dressing techniques  Blanca Bunch Hemi walker IOPI EMST device  Teaching Needs: Safety, medications, transfers, toileting, etc    Current Barriers to  Discharge: Decreased caregiver support, Incontinence, and Behavior  Possible Resolutions to Barriers: Family Education  SNF     Medical Summary Current Status: still incontinent of B/B-  no sleeping issues- no wounds- nerve pain LLE- HA's getting better  Barriers to Discharge: Behavior/Mood;Incontinence;Neurogenic Bowel & Bladder;Uncontrolled Pain;Spasticity;Self-care education  Barriers to Discharge Comments: nerve pain- LLE- is using LUE more; mild spasticity- tries to talk with food in mouth/dysphagia; still workingo n  hemiwalker; upgraded to regular diet. Cannot change to intermittent supervision for eating Possible Resolutions to [employer] Focus: increased duloxetine   to 120 mg daily for nerve pain and mood; d/c SNF   Continued Need for Acute Rehabilitation Level of Care: The patient requires daily medical management by a physician with specialized training in physical medicine and rehabilitation for the following reasons: Direction of a multidisciplinary physical rehabilitation program to maximize functional independence : Yes Medical management of patient stability for increased activity during participation in an intensive rehabilitation regime.: Yes Analysis of laboratory values and/or radiology reports with any subsequent need for medication adjustment and/or medical intervention. : Yes   I attest that I was present, lead the team conference, and concur with the assessment and plan of the team.   Jerene Monks 03/23/2024, 1102 am

## 2024-03-23 NOTE — Plan of Care (Signed)
  Problem: Consults Goal: RH STROKE PATIENT EDUCATION Description: See Patient Education module for education specifics  Outcome: Progressing   Problem: RH BOWEL ELIMINATION Goal: RH STG MANAGE BOWEL WITH ASSISTANCE Description: STG Manage Bowel with toileting Assistance. Outcome: Progressing   Problem: RH SAFETY Goal: RH STG ADHERE TO SAFETY PRECAUTIONS W/ASSISTANCE/DEVICE Description: STG Adhere to Safety Precautions With cues Assistance/Device. Outcome: Progressing   Problem: RH PAIN MANAGEMENT Goal: RH STG PAIN MANAGED AT OR BELOW PT'S PAIN GOAL Description: Pain managed < 4 with prns Outcome: Progressing

## 2024-03-23 NOTE — Progress Notes (Signed)
 Speech Language Pathology Daily Session Note  Patient Details  Name: Theresa Watkins MRN: [id number] Date of Birth: 09/14/1966  Today's Date: 03/23/2024 SLP Individual Time: 1301-1346 SLP Individual Time Calculation (min): 45 min  Short Term Goals: Week 6: SLP Short Term Goal 1 (Week 6): STGs= LTGs due to ELOS  Skilled Therapeutic Interventions:  Patient was seen in PM to address dysphagia management and speech intelligibility. Pt was alert and seated on EOB consuming lunch meal consisting of regular solids and thin liquid. Pt administering all trials. Pt with some improvement in bolus modifications this date however noted administering larger boluses of handheld food item with subsequent prolonged mastication and piece meal swallow. Pt responsive to education for decreasing bolus size. SLP recommending continuation of regular oslids and thin liquids with full supervision for standard swallowing precautions. In other minutes of session SLP addressed speech intelligibility through guided conversational exchanges. Pt observed with greater fluency this date with speech intelligibility ranging from 80- 90% at the conversational level. SLP re instructed pt in use of nonverbal communication to support speech as well as pausing between words in order to achieve breath support to maintain quality of speech. Pt verbalized understanding. At conclusion of session, pt was left in bed with call button within reach and bed alarm active. SLP to continue POC.   Pain Pain Assessment Pain Scale: 0-10 Pain Score: 0-No pain  Therapy/Group: Individual Therapy  Adela Holter 03/23/2024, 3:51 PM

## 2024-03-24 DIAGNOSIS — I63511 Cerebral infarction due to unspecified occlusion or stenosis of right middle cerebral artery: Secondary | ICD-10-CM | POA: Diagnosis not present

## 2024-03-24 MED ORDER — POLYETHYLENE GLYCOL 3350 17 G PO PACK
17.0000 g | PACK | Freq: Every day | ORAL | Status: DC
  Administered 2024-03-24 – 2024-04-01 (×5): 17 g via ORAL
  Filled 2024-03-24 (×10): qty 1

## 2024-03-24 NOTE — Plan of Care (Signed)
  Problem: Consults Goal: RH STROKE PATIENT EDUCATION Description: See Patient Education module for education specifics  Outcome: Progressing   Problem: RH BOWEL ELIMINATION Goal: RH STG MANAGE BOWEL WITH ASSISTANCE Description: STG Manage Bowel with toileting Assistance. Outcome: Progressing Goal: RH STG MANAGE BOWEL W/MEDICATION W/ASSISTANCE Description: STG Manage Bowel with Medication with mod I Assistance. Outcome: Progressing

## 2024-03-24 NOTE — Progress Notes (Signed)
 PROGRESS NOTE   Subjective/Complaints:  Pt reports harder to talk in AM when first akes up- also harder when needs to have BM- feels like cannot function when needs to poop.  L hand a little sore this AM- took off WHO earlier, but still has on Glove.   Having small BM's but nothing large- feels like cannot move bowels like should- doesn't want to be "cleaned out"- scared of incontinence.   Said stools are not hard though.    ROS:    Pt denies SOB, abd pain, CP, N/V/C/D, and vision changes   Objective:   No results found.     Recent Labs    03/22/24 0523  WBC 5.1  HGB 14.1  HCT 42.7  PLT 160        Recent Labs    03/22/24 0523  NA 137  K 3.8  CL 108  CO2 22  GLUCOSE 96  BUN 14  CREATININE 0.74  CALCIUM  8.6*         Intake/Output Summary (Last 24 hours) at 03/24/2024 0830 Last data filed at 03/23/2024 1300 Gross per 24 hour  Intake 720 ml  Output --  Net 720 ml           Physical Exam: Vital Signs Blood pressure 121/81, pulse 73, temperature 97.8 F (36.6 C), resp. rate 17, height 5\' 6"  (1.676 m), weight 64.4 kg, SpO2 96%.           General: awake, alert, appropriate, supine in bed- curled up; just woke up; NAD HENT: conjugate gaze; oropharynx moist CV: regular rate and rhythm; no JVD Pulmonary: CTA B/L; no W/R/R- good air movement GI: soft, but somewhat distended- hypoactive Psychiatric: appropriate- interactive- but frustrated about speech early in AM Neurological: moderate dysarthria- always worse early in AM- when wakes her up MSK- swelling ~ 40-50% better after glove- cannot make fist at all-  MAS of 0 in LUE Extremities: L hand with isotoner glove in place, still somewhat tender. Swelling decreased  Motor strength 0/5 LUE 3-/5 LLE 5/5 on right side   Dependent edema left hand and wrist.  No hypersensitivity to touch.  No temperature change. -sensory exam normal.  Prior neuro assessment is c/w today's exam 03/24/2024.  Skin- L PIP of 2nd digit- has scab     Musculoskeletal:  No tenderness over the left wrist.   Assessment/Plan: 1. Functional deficits which require 3+ hours per day of interdisciplinary therapy in a comprehensive inpatient rehab setting. Physiatrist is providing close team supervision and 24 hour management of active medical problems listed below. Physiatrist and rehab team continue to assess barriers to discharge/monitor patient progress toward functional and medical goals  Care Tool:  Bathing    Body parts bathed by patient: Left arm, Chest, Abdomen, Front perineal area, Buttocks, Right upper leg, Left upper leg, Right lower leg, Left lower leg, Face   Body parts bathed by helper: Right arm Body parts n/a: Right arm   Bathing assist Assist Level: Minimal Assistance - Patient > 75%     Upper Body Dressing/Undressing Upper body dressing   What is the patient wearing?: Pull over shirt    Upper body assist Assist  Level: Moderate Assistance - Patient 50 - 74%    Lower Body Dressing/Undressing Lower body dressing      What is the patient wearing?: Pants, Underwear/pull up     Lower body assist Assist for lower body dressing: Minimal Assistance - Patient > 75%     Toileting Toileting    Toileting assist Assist for toileting: Moderate Assistance - Patient 50 - 74%     Transfers Chair/bed transfer  Transfers assist     Chair/bed transfer assist level: Minimal Assistance - Patient > 75%     Locomotion Ambulation   Ambulation assist      Assist level: Moderate Assistance - Patient 50 - 74% Assistive device: Walker-hemi Max distance: 28   Walk 10 feet activity   Assist     Assist level: Moderate Assistance - Patient - 50 - 74% Assistive device: Walker-hemi   Walk 50 feet activity   Assist Walk 50 feet with 2 turns activity did not occur: Safety/medical concerns (L hemiplegia and pt  fatigue)  Assist level: Moderate Assistance - Patient - 50 - 74% Assistive device: Hand held assist    Walk 150 feet activity   Assist Walk 150 feet activity did not occur: Safety/medical concerns (L hemiplegia and pt fatigue)  Assist level: Minimal Assistance - Patient > 75% Assistive device: Walker-Eva    Walk 10 feet on uneven surface  activity   Assist Walk 10 feet on uneven surfaces activity did not occur: Safety/medical concerns (L hemiplegia and pt fatigue)         Wheelchair     Assist Is the patient using a wheelchair?: Yes Type of Wheelchair: Manual    Wheelchair assist level: Supervision/Verbal cueing Max wheelchair distance: 50    Wheelchair 50 feet with 2 turns activity    Assist        Assist Level: Supervision/Verbal cueing   Wheelchair 150 feet activity     Assist      Assist Level: Minimal Assistance - Patient > 75%   Blood pressure 121/81, pulse 73, temperature 97.8 F (36.6 C), resp. rate 17, height 5\' 6"  (1.676 m), weight 64.4 kg, SpO2 96%.   Medical Problem List and Plan: 1. 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 Search for facility ongoing. May only be able to go to custodial care facility  D/c SNF- hopefully by end of week/early next week  Con't CIR PT, OT and SLP  Pt feels like bowels interfering in her function? P2P denied- appealed by family for 2nd time.  2. DVT/anticoagulation:  Pharmaceutical: Lovenox              -antiplatelet therapy: Aspirin  81 mg daily and Plavix  75 mg daily x 3 weeks then Plavix  alone 3. Pain Management: Tylenol  as needed  3/25- denies pain- con't regimen rpn   3-30: Ongoing left hip pain; Voltaren  gel and Tylenol .    4/3-  having new burning pain in LUE- which is seen sometimes with stroke- will add Duloxetine  30 mg daily for nerve pain- monitor for side effects.  -if gets nausea, will change to Lyrica 4/4-6 no side effects so far-  4/8- will increase Duloxetine  to 60  mg daily  DC hydrocodone  due to side effects, trial tyl #3  as needed for pain that is not relieved by Tylenol . 4/28- advised pt to use voltaren  gel  on all sides of L hand including between fingers- use tylenol  #3 as needed 4/29- pt admits doesn't like taking/using meds all the time- explained I don't have a "fix"- to make it better- have to take meds to have them work 4/30- encouraged again to use voltaren  4x/day for L hand 5/1- pt said using voltaren  gel and tylenol  #3 as needed- adding lidoderm  patches 2 patches at night for back pain 5/2- HA's- will restart SCHEDULED Topamax  75 mg at bedtime- not sure what occurred? Needs to get nightly 5/3-4 appears improved with topamax  5/5- Will cont' topamax - hand still bothering her- not clear what type of pain- but tylenol  helps some.  5/6- will increase Duloxetine  to 120 mg daily to help with nerve pain and mood 4. Depression/Behavior/Sleep: Provide emotional support  4/21 mood improved--continue duloxetine              -antipsychotic agents: N/A 5. Neuropsych/cognition: This patient is capable of making decisions on her own behalf. 6. Skin/Wound Care: Routine skin checks 7. Fluids/Electrolytes/Nutrition:encouraging po.   4/21- I personally reviewed the patient's labs today.     -albumin still a bit low--encourage po/proteins. We have discussed improving intake 8.  Dysphagia.  Dysphagia 3 with thins currently  4/18 intake ok    9.  Hyperlipidemia.  Crestor  10.  Recent History of cocaine/tobacco use.  UDS positive for cocaine.  Provide counseling, this has been a chronic issue , seen in PCP notes 11.  Hypothyroidism. continue Synthroid   12.  Constipation.   Senokot-S 2 tab twice daily  5/6- LBM  yesterday even though chart says 5/3  5/7- will add Miralax - esp since increased Duloxetine  which can cause constipation- already on Senna 2 tabs BID 15. Blurry vision and dysconjugate gaze  3/27- will need Ophtho after d/c.   3-29: Seems to mostly be from the left eye, with difficulty converging and tracking; messaged OT and PT to trial taping versus alternating eye patch to help with convergence.  3-30: Reporting improvement with use of taping  4/4- pt reports everything blurry- cannot see faces as a result- never had eye checked in past- used readers but cannot see at a distance since stroke- will send to Ophtho after d/c.   4/7- d/w pt again- she wants eyeglasses- explained we cannot get when in hospital- also per guidelines, since vision can still change, to not get for 3 months after stroke. She disbelieves it's from stroke, since had 2 prior- that didn't cause vision changes- we educated pt pn how this can occur.   4/25- reminded pt cannot get Optho in hospital- need to get once d/c'd  4/30 went over visual changes with pt- will need to get glasses, likely prisms 2-3 months after d/c once vision stabilizes  5/6- d/w pt again- she wants to see Optho- explained they do not come into hospital for this 16.  Left hip pain.  -3-29 x-ray showing moderate degenerative joint disease of the hip; no fracture  -Continue Voltaren  gel to hip 4 times daily  -May benefit from steroid injection as an outpatient  3/31- started Vit D and Ca per pt request  - improved to an extent  4/24- Pt reports L hip still hurts when stands/works with therapy however don't want her on opiates regularly due to can impair sensorium and she also  ready has issues  17. Severe HA-daily HA's  4/15-16 sx appear improved with topamax , tylenol  helps also   -may use sx to get out of therapy at times?  4/23- Per team, pt having HA's frequently which impairs therapy- she won't discuss HA's with me- unclear if only has a day goes on  vs self limting behavior  4/24- increase to 75 mg at bedtime  4/25- still having HA's, actually thinks a little worse since not taking Norco for it-   4/28-4/29 fewer complaints about HA this AM  4/30-5/1- - per staff, c/o these less- not interfering with therapy as much  5/2- Wasn't getting topamax - just taking T#3- wil restart Topamax - don't know what occurred with order  5/6- HA's doing better 18. Decreased safety awareness/impulsiveness  4/30- still requiring max cues  5/7- per therapy- still requiring max cues 20. Spasticity  4/22- off baclofen , and c/o LUE pain- wondering how much of her complaints are due to tone?-   4/30- Spasticity improved in LUE- MAS of 0 actually this AM 21. 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 .   4/30- pt pulled scab off-  22.  Urinary  and bowel incont due to CVA, cont toileting program 4/23- still cannot control B/B 4/30- on toileting program- is getting somewhat better, but still incontinent more at night   23. 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   4/29- will get Dopplers, because pt says she elevated hand overnight- still swollen- ordered  4/30- Dopplers (-)- will get isotoner compression glove  5/4 received glove  5/6- Swelling ~ 50% better- will increase Duloxetine  to 120 mg daily for nerve pain in L hand and mood.   5/7- hand sore this AM- but not "killing her".     LOS: 44 days A FACE TO FACE EVALUATION WAS PERFORMED  Gershon Shorten 03/24/2024, 8:30 AM

## 2024-03-24 NOTE — Progress Notes (Signed)
 Physical Therapy Session Note  Patient Details  Name: Theresa Watkins MRN: [id number] Date of Birth: 02-19-1966  Today's Date: 03/24/2024 PT Individual Time: 1135-1215 PT Individual Time Calculation (min): 40 min   Short Term Goals: Week 6:  PT Short Term Goal 1 (Week 6): pt will ambulate 100 ft with hemi walker PT Short Term Goal 2 (Week 6): pt will perform bed/chair transfers with CGA both directions  Skilled Therapeutic Interventions/Progress Updates: Pt presented in bed agreeable to therapy with encouragement. Pt states significant pain in hips and shoulders. Per pt cannot recall any activities that would have exacerbated pain. Pt reluctantly agreeable to work on LLE activities/therex for forced use and LLE strengthening. Pt performed SLR, hip abd/add, and manually resisted leg press to fatigue (12-15 reps). Pt then expressing urgent need for void. Pt began to complete supine to sit requiring modA however pt with incontinent urinary void that leaked through sheets. Pt therefore encouraged to complete pivot to [facility] to empty bladder and to allow for peri-care and sheet change. Pt required modA for stand pivot transfer and total A for peri-care. Once completed pt stood with [facility] with minA and required total A for brief management and modA to complete transfer back to bed. Required minA for lateral scoot towards [facility] for improved positioning prior to transferring to supine. Pt required incresed effort however was able to bring LLE onto bed with CGA. Pt repositioned to comfort and left in bed at end session with bed alarm on, call bell within reach and needs met.      Therapy Documentation Precautions:  Precautions Precautions: Fall Precaution/Restrictions Comments: L hemi Restrictions Weight Bearing Restrictions Per Provider Order: No General:   Vital Signs:   Pain: Pain Assessment Pain Scale: 0-10 Pain Score: 0-No pain    Therapy/Group: Individual Therapy  Paislie Tessler 03/24/2024,  12:49 PM

## 2024-03-24 NOTE — Progress Notes (Signed)
 Occupational Therapy Session Note  Patient Details  Name: Theresa Watkins MRN: [id number] Date of Birth: 1966/01/04  Today's Date: 03/24/2024 OT Individual Time: 0930-1015 OT Individual Time Calculation (min): 45 min  and Today's Date: 03/24/2024 OT Missed Time: 30 Minutes Missed Time Reason: Patient fatigue   Short Term Goals: Week 6:  OT Short Term Goal 1 (Week 6): Pt will perform UB dressing tasks with min A OT Short Term Goal 2 (Week 6): Pt will perfrom toileting tasks with min A  Skilled Therapeutic Interventions/Progress Updates:    Pt sleeping upon arrival but easily aroused. Encouraged pt to don clothing for therapy but pt resistant. Pt reports she didn't sleep well during the night and just wanted to sleep. Pt agreeable to sitting EOB. Rolling in bed and supine>sit with CGA and extra time. Sitting EOB with close supervision. Pt able to scoot to EOB with supervision and extra time. Pt declined donning clothing and continued to report that she needed to sleep. Educated pt on importance of participating in therapy. Pt verbalized understanding but continued to state she needed to sleep. Sit>supine with supervision. Pt remained in bed with all needs within reach. Bed alarm activated. Pt missed 30 mins skilled OT services 2/2 lethargy. Will attempt to see pt as schedule allows.   Therapy Documentation Precautions:  Precautions Precautions: Fall Precaution/Restrictions Comments: L hemi Restrictions Weight Bearing Restrictions Per Provider Order: No General: General OT Amount of Missed Time: 30 Minutes   Pain: Pt reports her LUE/hand is feeling better but still "hurts" (unrated)   Therapy/Group: Individual Therapy  [name] 03/24/2024, 10:23 AM

## 2024-03-24 NOTE — Progress Notes (Signed)
 Patient ID: Theresa Watkins, female   DOB: [birth year], 58 y.o.   MRN: [id number]  SW returned phone call  and left message with [facility]PS [phone]) to discuss submitting APS referral.   SW left message for the following to discuss bed offer and waiting on follow-up.  :   Christie/Admissions with [facility] [name]/Admissions with [facility]  Debbie  SW spoke with [name]/Admissions with [facility] to discuss bed availability and they are full at this time.   SW spoke with Marlou Sims B./[facility] [facility] to submit referral. States will forward to supervisor and will follow-up if accepted.   Norval Been, MSW, LCSW Office: [phone] Cell: [phone] Fax: [phone]

## 2024-03-24 NOTE — Progress Notes (Signed)
 Speech Language Pathology Daily Session Note  Patient Details  Name: DAPHNA FILAR MRN: [id number] Date of Birth: 25-Jun-1966  Today's Date: 03/24/2024 SLP Individual Time: 0832-0900 SLP Individual Time Calculation (min): 28 min  Short Term Goals: Week 6: SLP Short Term Goal 1 (Week 6): STGs= LTGs due to ELOS  Skilled Therapeutic Interventions: Skilled therapy session focused on communication and swallowing goals. SLP observed patient during consumption of a regular/thin diet (food cut up by staff). Patient with mildly prolonged, however functional mastication times and required supervision-minA to ensure oral clearance. x1 instance of immediate cough after large sip of coffee via cup, which may indicate bolus misdirection. Continue current diet with assistance from staff to cut up food as necessary due to physical need. SLP targeted communication through review of speech intelligibility strategies. Patient independently recalled 3/4 and recalled remainder given minA. Patient utilized speech strategies with supervision A to achieve 90% intelligibility during role playing conversational scenarios. Patient left in bed with alarm set and call bell in reach. Continue POC.   Pain Pain Assessment Pain Scale: 0-10 Pain Score: 0-No pain  Therapy/Group: Individual Therapy  Oneta Sigman M.A., CCC-SLP  03/24/2024, 10:05 AM

## 2024-03-24 NOTE — Progress Notes (Signed)
 Speech Language Pathology Weekly Progress and Session Note  Patient Details  Name: Theresa Watkins MRN: [id number] Date of Birth: 06/07/1966  Beginning of progress report period: March 17, 2024 End of progress report period: Mar 24, 2024  Today's Date: 03/24/2024 SLP Individual Time: 0832-0900 SLP Individual Time Calculation (min): 28 min  Short Term Goals: Week 6: SLP Short Term Goal 1 (Week 6): STGs= LTGs due to ELOS  New Short Term Goals: Week 7: SLP Short Term Goal 1 (Week 7): Patient will utilize swallowing compensatory strategies to reduce s/sx of aspiration during consumption of PO given sup multimodal A SLP Short Term Goal 2 (Week 7): Patient will communicate complex information with 90% speech intelligibility with sup cues  Weekly Progress Updates: Patient made steady gains this reporting period. Patient was upgraded to regular solids however continued to benefit from full supervision due to inconsistent use of compenstaory strategies and remaining high risk of aspiration. Speech intelligibility fluctuates between 70-90% to familiar and unfamiliar listeners with pt able to idenitfy some variables that contribute to speech intelligibility. Pt/ family education ongoing. Pt would benefit from continued skilled SLP intervention to maximize swallowing and speech in order to maximize her functional independence prior to discharge.  Intensity: Minumum of 1-2 x/day, 30 to 90 minutes Frequency: 1 to 3 out of 7 days Duration/Length of Stay: 4/25 Treatment/Interventions: Multimodal communication approach;Speech/Language facilitation;Functional tasks;Therapeutic Activities;Therapeutic Exercise;Internal/external aids;Dysphagia/aspiration precaution training;Patient/family education  Daily Session  Skilled Therapeutic Interventions: Pt was seen in am to address dysphagia managment and speech intelligibility. Pt was alert upon SLP arrival and agreeable for session. Breakfast meal [resent  however SLP noted pt with difficulty administering foods due to feeding difiuclty. SLP provided set up A including openeing containers and cuting food into smaller pieces. Pt administering boluses and warranting intermittent cues for smaller boluses and to cease talking during trials. SLP noted prolonged mastication of solids and min lingual residue post swallow with pt able to clear with additional time and/ or liquid wash. She was without s/s aspiration. Due to need for continued cues for use of standard swallowing precautions and newly upgraded diet, SLP recommedning continuation of regular solid diet and full supervision. In other minutes of session, SLP addressing speech intelligibility through review of speech intelligibility strategies with emphasis on pausing intermittently and overarticulation. SLP also reviewed recommendations for upright positoning to maximize speech and continued recommendations for use of EMST 2x days for breath support and swallowing functions. At conclusion of session, pt was left upright in bed with call button within reach and bed alarm active. SLP to continue POC.      General    Pain Pain Assessment Pain Scale: 0-10 Pain Score: 0-No pain  Therapy/Group: Individual Therapy  Adela Holter 03/24/2024, 12:14 PM

## 2024-03-25 DIAGNOSIS — I63511 Cerebral infarction due to unspecified occlusion or stenosis of right middle cerebral artery: Secondary | ICD-10-CM | POA: Diagnosis not present

## 2024-03-25 NOTE — Group Note (Signed)
 Patient Details Name: SHAKIA BRUCH MRN: [id number] DOB: [birth year] Today's Date: 03/25/2024  Time Calculation: OT Group Time Calculation OT Group Start Time: 1200 OT Group Stop Time: 1245 OT Group Time Calculation (min): 45 min      Group Description: [employer]: Patient participated in [employer] with both OT with focus on self-feeding, dysphagia goals, cognitive goals, and appropriate social interaction within a group environment.    Individual level documentation: Patient participated in diner's club and required min assist for functional use of left UE during self-feeding as a stabilizer.   Patient participated in diner's club with focus on dysphagia goals. Patient consumed their lunch meal of regular textures. Patient consumed meal without overt s/s of aspiration and required supervision assist for use of swallowing compensatory strategies. Recommend patient continue current diet.   Patient participated in diner's club with focus on cognitive goals. Patient required (drop down selection supervision assist for selective attention in a mildly distracting environment. Patient also required supervision assist for visual scanning of environment. Patient also demonstrated appropriate social interaction within a group environment with overall  assist.   Pain:  No c/o pain in session       [name] [facility] 03/25/2024, 1:20 PM

## 2024-03-25 NOTE — Progress Notes (Signed)
 Recreational Therapy Session Note  Patient Details  Name: TAHERA PRENDES MRN: [id number] Date of Birth: 15-Jun-1966 Today's Date: 03/25/2024 Goal:  Pt will actively participate in 30 minute therapeutic dance group with supervision.  MET Group Description: Dance Group: Pt participated in dance group with an emphasis on social interaction, motor planning, increasing overall activity tolerance and bimanual tasks. All songs were selected by group members. Dance moves included AROM of BUE/BLE gross motor movements with an emphasis on building functional endurance.      Individual level documentation: Patient completed group from sitting level with supervision.  Patient needed supervision to complete various dance moves with cues for instruction/demonstration.  Patient needed minimal modifications during group and initiated modifications with mod I.  Pt demonstrated good safety awareness throughout the session.  Pain:  no c/o  Cyla Haluska 03/25/2024, 4:15 PM

## 2024-03-25 NOTE — Progress Notes (Signed)
 Physical Therapy Session Note  Patient Details  Name: Theresa Watkins MRN: [id number] Date of Birth: 06-21-1966  Today's Date: 03/25/2024 PT Individual Time: 0940-1000 PT Individual Time Calculation (min): 20 min   Short Term Goals: Week 6:  PT Short Term Goal 1 (Week 6): pt will ambulate 100 ft with hemi walker PT Short Term Goal 2 (Week 6): pt will perform bed/chair transfers with CGA both directions  Skilled Therapeutic Interventions/Progress Updates: Pt presented in at toilet agreeable to therapy. Pt missed 10 min skilled PT due to toileting. Once completed pt completed pericare supervision. Once completed PTA threaded pants total A and pt donned shirt with minA. Pt completed Stedy transfer with CGA to stand to w/c. Completed hand hygiene w/c level. Pt then worked on w/c propulsion to day room for endurance with pt requiring x 3 rest breaks due to fatigue. Pt transported partial distance back to room and left in w/c due to OT session in 15 min. Pt left with call bell within reach and needs met.      Therapy Documentation Precautions:  Precautions Precautions: Fall Precaution/Restrictions Comments: L hemi Restrictions Weight Bearing Restrictions Per Provider Order: No General: PT Amount of Missed Time (min): 10 Minutes PT Missed Treatment Reason: Toileting    Therapy/Group: Individual Therapy  Smriti Barkow 03/25/2024, 12:27 PM

## 2024-03-25 NOTE — Progress Notes (Signed)
 Patient ID: Theresa Watkins, female   DOB: Apr 13, 1966, 58 y.o.   MRN: [id number]  SW received return phone call from Payette with [facility] and informed that case was not accepted, however, a referral to discharge planning SW was submitted and there will be follow-up from her. Encouraged follow-up if needed.   Norval Been, MSW, LCSW Office: [phone] Cell: [phone] Fax: [phone]

## 2024-03-25 NOTE — Progress Notes (Signed)
 Occupational Therapy Session Note  Patient Details  Name: Theresa Watkins MRN: [id number] Date of Birth: 1966/03/30  Today's Date: 03/25/2024 OT Individual Time: 1015-1100 OT Individual Time Calculation (min): 45 min    Short Term Goals: Week 6:  OT Short Term Goal 1 (Week 6): Pt will perform UB dressing tasks with min A OT Short Term Goal 2 (Week 6): Pt will perfrom toileting tasks with min A  Skilled Therapeutic Interventions/Progress Updates:    OT intervention with focus on toileting and LUE functional use. Mod A for toileting tasks. Sit<>stand with min A. Min A for standing balance. Skilled OT intervention with focus on standing balance, functional transfers, LUE functiona use, and safety awareness to increase independence with BADLs. Pt with increased Lt shoulder flexion/abduction/adduction. Grasp not functional, possiobly 2/2 ongoing edema. Pt wearing compression glove and wrist cock up splint. Pt remained in w/c with all needs withi reahc.   Therapy Documentation Precautions:  Precautions Precautions: Fall Precaution/Restrictions Comments: L hemi Restrictions Weight Bearing Restrictions Per Provider Order: No   Pain: Pt reports LUE "feeling better:   Therapy/Group: Individual Therapy  [name] 03/25/2024, 11:05 AM

## 2024-03-25 NOTE — Progress Notes (Signed)
 Speech Language Pathology Daily Session Note  Patient Details  Name: Theresa Watkins MRN: [id number] Date of Birth: 04-08-1966  Today's Date: 03/25/2024 SLP Individual Time: 1430-1500 SLP Individual Time Calculation (min): 30 min  Short Term Goals: Week 6: SLP Short Term Goal 1 (Week 6): STGs= LTGs due to ELOS  Skilled Therapeutic Interventions:   Pt greeted at bedside. She was awake in bed upon SLP arrival. She was very pleasant and cooperative throughout tasks targeting speech production. EMST training targeted via EMST75 and she completed 25 reps w/ s cues for technique reminders. Attempted to increase resistance level .5-1 cmH2O, though pt unable to complete w/ effective technique so resistance level remained as is. She benefited from minA cues to utilize slow rate, breath support, and over articulation throughout conversation re progress thus far, POC, and remaining deficits. SLP assisted pt to her chair and then to the dayroom. Direct handoff completed w/ OT. Recommend cont ST.   Pain  Pt reported 6/10 headache. Politely declined pain medication.   Therapy/Group: Individual Therapy  Rozell Cornet 03/25/2024, 4:17 PM

## 2024-03-25 NOTE — Progress Notes (Signed)
 PROGRESS NOTE   Subjective/Complaints:  Pt reports still need to have BM- LBM 2 days ago.   Feeling "great' this AM- only requiring tylenol  for HA/pain, not T#3.   Can move better for some reason today   ROS:    Pt denies SOB, abd pain, CP, N/V/C/D, and vision changes    Objective:   No results found.     No results for input(s): "WBC", "HGB", "HCT", "PLT" in the last 72 hours.       No results for input(s): "NA", "K", "CL", "CO2", "GLUCOSE", "BUN", "CREATININE", "CALCIUM " in the last 72 hours.       No intake or output data in the 24 hours ending 03/25/24 0858          Physical Exam: Vital Signs Blood pressure (!) 142/88, pulse 76, temperature 98 F (36.7 C), resp. rate 17, height 5\' 6"  (1.676 m), weight 64.4 kg, SpO2 96%.            General: awake, alert, appropriate, supine in bed;  NAD HENT: conjugate gaze; oropharynx moist CV: regular rate and rhythm; no JVD Pulmonary: CTA B/L; no W/R/R- good air movement GI: soft, NT, ND, (+)BS Psychiatric: appropriate- bright affect this AM Neurological: moderate dysarthria- always worse early in AM- when wakes her up MSK- swelling ~ 40-50% better after glove- cannot make fist at all-  MAS of 0 in LUE Extremities: L hand with isotoner glove in place, still somewhat tender. Swelling decreased- doing better  Motor strength 0/5 LUE 3-/5 LLE 5/5 on right side   Dependent edema left hand and wrist.  No hypersensitivity to touch.  No temperature change. -sensory exam normal. Prior neuro assessment is c/w today's exam 03/25/2024.  Skin- L PIP of 2nd digit- has scab     Musculoskeletal:  No tenderness over the left wrist.   Assessment/Plan: 1. Functional deficits which require 3+ hours per day of interdisciplinary therapy in a comprehensive inpatient rehab setting. Physiatrist is providing close team supervision and 24 hour management of active  medical problems listed below. Physiatrist and rehab team continue to assess barriers to discharge/monitor patient progress toward functional and medical goals  Care Tool:  Bathing    Body parts bathed by patient: Left arm, Chest, Abdomen, Front perineal area, Buttocks, Right upper leg, Left upper leg, Right lower leg, Left lower leg, Face   Body parts bathed by helper: Right arm Body parts n/a: Right arm   Bathing assist Assist Level: Minimal Assistance - Patient > 75%     Upper Body Dressing/Undressing Upper body dressing   What is the patient wearing?: Pull over shirt    Upper body assist Assist Level: Moderate Assistance - Patient 50 - 74%    Lower Body Dressing/Undressing Lower body dressing      What is the patient wearing?: Pants, Underwear/pull up     Lower body assist Assist for lower body dressing: Minimal Assistance - Patient > 75%     Toileting Toileting    Toileting assist Assist for toileting: Moderate Assistance - Patient 50 - 74%     Transfers Chair/bed transfer  Transfers assist     Chair/bed transfer assist level: Minimal  Assistance - Patient > 75%     Locomotion Ambulation   Ambulation assist      Assist level: Moderate Assistance - Patient 50 - 74% Assistive device: Walker-hemi Max distance: 28   Walk 10 feet activity   Assist     Assist level: Moderate Assistance - Patient - 50 - 74% Assistive device: Walker-hemi   Walk 50 feet activity   Assist Walk 50 feet with 2 turns activity did not occur: Safety/medical concerns (L hemiplegia and pt fatigue)  Assist level: Moderate Assistance - Patient - 50 - 74% Assistive device: Hand held assist    Walk 150 feet activity   Assist Walk 150 feet activity did not occur: Safety/medical concerns (L hemiplegia and pt fatigue)  Assist level: Minimal Assistance - Patient > 75% Assistive device: Walker-Eva    Walk 10 feet on uneven surface  activity   Assist Walk 10 feet on  uneven surfaces activity did not occur: Safety/medical concerns (L hemiplegia and pt fatigue)         Wheelchair     Assist Is the patient using a wheelchair?: Yes Type of Wheelchair: Manual    Wheelchair assist level: Supervision/Verbal cueing Max wheelchair distance: 50    Wheelchair 50 feet with 2 turns activity    Assist        Assist Level: Supervision/Verbal cueing   Wheelchair 150 feet activity     Assist      Assist Level: Minimal Assistance - Patient > 75%   Blood pressure (!) 142/88, pulse 76, temperature 98 F (36.7 C), resp. rate 17, height 5\' 6"  (1.676 m), weight 64.4 kg, SpO2 96%.   Medical Problem List and Plan: 1. 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 Search for facility ongoing. May only be able to go to custodial care facility  D/c SNF- hopefully by end of week/early next week  Con't CIR PT, OT and SLP  LBM 2 days ago- doesn't want sorbitol - added miralax  P2P denied- appealed by family for 2nd time.  2. DVT/anticoagulation:  Pharmaceutical: Lovenox              -antiplatelet therapy: Aspirin  81 mg daily and Plavix  75 mg daily x 3 weeks then Plavix  alone 3. Pain Management: Tylenol  as needed  3/25- denies pain- con't regimen rpn   3-30: Ongoing left hip pain; Voltaren  gel and Tylenol .    4/3- having new burning pain in LUE- which is seen sometimes with stroke- will add Duloxetine  30 mg daily for nerve pain- monitor for side effects.  -if gets nausea, will change to Lyrica 4/4-6 no side effects so far-  4/8- will increase Duloxetine  to 60 mg daily  DC hydrocodone  due to side effects, trial tyl #3  as needed for pain that is not relieved by Tylenol . 4/28- advised pt to  use voltaren  gel  on all sides of L hand including between fingers- use tylenol  #3 as needed 4/29- pt admits doesn't like taking/using meds all the time- explained I don't have a "fix"- to make it better- have to take meds to have them work 4/30- encouraged again to use voltaren  4x/day for L hand 5/1- pt said  using voltaren  gel and tylenol  #3 as needed- adding lidoderm  patches 2 patches at night for back pain 5/2- HA's- will restart SCHEDULED Topamax  75 mg at bedtime- not sure what occurred? Needs to get nightly 5/3-4 appears improved with topamax  5/5- Will cont' topamax - hand still bothering her- not clear what type of pain- but tylenol  helps some.  5/6- will increase Duloxetine  to 120 mg daily to help with nerve pain and mood 5/8- pt reports pain so much better in last 24 hours- Duloxetine  kicking in? 4. Depression/Behavior/Sleep: Provide emotional support  4/21 mood improved--continue duloxetine              -antipsychotic agents: N/A 5. Neuropsych/cognition: This patient is capable of making decisions on her own behalf. 6. Skin/Wound Care: Routine skin checks 7. Fluids/Electrolytes/Nutrition:encouraging po.   4/21- I personally reviewed the patient's labs today.     -albumin still a bit low--encourage po/proteins. We have discussed improving intake 8.  Dysphagia.  Dysphagia 3 with thins currently  4/18 intake ok    9.  Hyperlipidemia.  Crestor  10.  Recent History of cocaine/tobacco use.  UDS positive for cocaine.  Provide counseling, this has been a chronic issue , seen in PCP notes 11.  Hypothyroidism. continue Synthroid   12.  Constipation.   Senokot-S 2 tab twice daily  5/6- LBM yesterday even though chart says 5/3  5/7- will add Miralax - esp since increased Duloxetine  which can cause constipation- already on Senna 2 tabs BID 15. Blurry vision and dysconjugate gaze  3/27- will need Ophtho after d/c.   3-29: Seems to mostly be from the left eye, with difficulty converging and  tracking; messaged OT and PT to trial taping versus alternating eye patch to help with convergence.  3-30: Reporting improvement with use of taping  4/4- pt reports everything blurry- cannot see faces as a result- never had eye checked in past- used readers but cannot see at a distance since stroke- will send to Ophtho after d/c.   4/7- d/w pt again- she wants eyeglasses- explained we cannot get when in hospital- also per guidelines, since vision can still change, to not get for 3 months after stroke. She disbelieves it's from stroke, since had 2 prior- that didn't cause vision changes- we educated pt pn how this can occur.   4/25- reminded pt cannot get Optho in hospital- need to get once d/c'd  4/30 went over visual changes with pt- will need to get glasses, likely prisms 2-3 months after d/c once vision stabilizes  5/6- d/w pt again- she wants to see Optho- explained they do not come into hospital for this 16.  Left hip pain.  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 Severe HA-daily HA's  4/15-16 sx appear improved with topamax , tylenol  helps also   -may use sx to get out of therapy at times?  4/23- Per team, pt having HA's frequently which impairs therapy- she won't discuss HA's with me- unclear if only has a day goes on vs self limting behavior  4/24- increase to 75 mg at bedtime  4/25- still having HA's,  actually thinks a little worse since not taking Norco for it-   4/28-4/29 fewer complaints about HA this AM  4/30-5/1- - per staff, c/o these less- not interfering with therapy as much  5/2- Wasn't getting topamax -  just taking T#3- wil restart Topamax - don't know what occurred with order  5/6- HA's doing better  5/8- not requiring T#3 anymore lately per pt 18. Decreased safety awareness/impulsiveness  4/30- still requiring max cues  5/7- per therapy- still requiring max cues 20. Spasticity  4/22- off baclofen , and c/o LUE pain- wondering how much of her complaints are due to tone?-   4/30- Spasticity improved in LUE- MAS of 0 actually this AM 21. 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 .   4/30- pt pulled scab off-  22.  Urinary  and bowel incont due to CVA, cont toileting program 4/23- still cannot control B/B 4/30- on toileting program- is getting somewhat better, but still incontinent more at night   23. 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   4/29- will get Dopplers, because pt says she elevated hand overnight- still swollen- ordered  4/30- Dopplers (-)- will get isotoner compression glove  5/4 received glove  5/6- Swelling ~ 50% better- will increase Duloxetine  to 120 mg daily for nerve pain in L hand and mood.   5/7- hand sore this AM- but not "killing her".   5/8- pain great this AM- doesn't need meds other than tylenol  per pt -sounds like increased cymbalta  has helped    LOS: 45 days A FACE TO FACE EVALUATION WAS PERFORMED  Berley Gambrell 03/25/2024, 8:58 AM

## 2024-03-26 DIAGNOSIS — I63511 Cerebral infarction due to unspecified occlusion or stenosis of right middle cerebral artery: Secondary | ICD-10-CM | POA: Diagnosis not present

## 2024-03-26 NOTE — Progress Notes (Signed)
 PROGRESS NOTE   Subjective/Complaints:  Pt feeling more pain in L hand this AM- but hasn't taken any pain meds- including tylenol  yet this AM.  Is bothering her in general.   Also notes that catches breath sometimes- but denies SOB- feels theres' something wrong with breathing".  Used ICS x1 yesterday  ROS:    Pt denies SOB, abd pain, CP, N/V/C/D, and vision changes   Objective:   No results found.     No results for input(s): "WBC", "HGB", "HCT", "PLT" in the last 72 hours.       No results for input(s): "NA", "K", "CL", "CO2", "GLUCOSE", "BUN", "CREATININE", "CALCIUM " in the last 72 hours.        Intake/Output Summary (Last 24 hours) at 03/26/2024 0806 Last data filed at 03/25/2024 1900 Gross per 24 hour  Intake 598 ml  Output --  Net 598 ml            Physical Exam: Vital Signs Blood pressure 114/88, pulse 82, temperature 97.7 F (36.5 C), temperature source Oral, resp. rate 17, height 5\' 6"  (1.676 m), weight 64.4 kg, SpO2 96%.            General: awake, alert, appropriate, sitting up some I bed- more awake this AM; NAD HENT: conjugate gaze; oropharynx moist CV: regular rate and rhythm; no JVD Pulmonary: CTA B/L; no W/R/R- slightly decreased at bases- atelectasis? GI: soft, NT, ND, (+)BS Psychiatric: appropriate, sweet affect Neurological: Ox3- moderate dysarthria still-  MSK- swelling ~ 40-50% better after glove- cannot make fist at all- wearing glove and WHO on L hand- slightly TTP this AM throughout MAS of 0 in LUE Extremities: L hand with isotoner glove in place, still somewhat tender. Swelling decreased- doing better  Motor strength 0/5 LUE 3-/5 LLE 5/5 on right side   Dependent edema left hand and wrist.  No hypersensitivity to touch.  No temperature change. -sensory exam normal. Prior neuro assessment is c/w today's exam 03/26/2024.  Skin- L PIP of 2nd digit- has scab      Musculoskeletal:  No tenderness over the left wrist.   Assessment/Plan: 1. Functional deficits which require 3+ hours per day of interdisciplinary therapy in a comprehensive inpatient rehab setting. Physiatrist is providing close team supervision and 24 hour management of active medical problems listed below. Physiatrist and rehab team continue to assess barriers to discharge/monitor patient progress toward functional and medical goals  Care Tool:  Bathing    Body parts bathed by patient: Left arm, Chest, Abdomen, Front perineal area, Buttocks, Right upper leg, Left upper leg, Right lower leg, Left lower leg, Face   Body parts bathed by helper: Right arm Body parts n/a: Right arm   Bathing assist Assist Level: Minimal Assistance - Patient > 75%     Upper Body Dressing/Undressing Upper body dressing   What is the patient wearing?: Pull over shirt    Upper body assist Assist Level: Moderate Assistance - Patient 50 - 74%    Lower Body Dressing/Undressing Lower body dressing      What is the patient wearing?: Pants, Underwear/pull up     Lower body assist Assist for lower body dressing: Minimal  Assistance - Patient > 75%     Toileting Toileting    Toileting assist Assist for toileting: Moderate Assistance - Patient 50 - 74%     Transfers Chair/bed transfer  Transfers assist     Chair/bed transfer assist level: Minimal Assistance - Patient > 75%     Locomotion Ambulation   Ambulation assist      Assist level: Moderate Assistance - Patient 50 - 74% Assistive device: Walker-hemi Max distance: 28   Walk 10 feet activity   Assist     Assist level: Moderate Assistance - Patient - 50 - 74% Assistive device: Walker-hemi   Walk 50 feet activity   Assist Walk 50 feet with 2 turns activity did not occur: Safety/medical concerns (L hemiplegia and pt fatigue)  Assist level: Moderate Assistance - Patient - 50 - 74% Assistive device: Hand held assist     Walk 150 feet activity   Assist Walk 150 feet activity did not occur: Safety/medical concerns (L hemiplegia and pt fatigue)  Assist level: Minimal Assistance - Patient > 75% Assistive device: Walker-Eva    Walk 10 feet on uneven surface  activity   Assist Walk 10 feet on uneven surfaces activity did not occur: Safety/medical concerns (L hemiplegia and pt fatigue)         Wheelchair     Assist Is the patient using a wheelchair?: Yes Type of Wheelchair: Manual    Wheelchair assist level: Supervision/Verbal cueing Max wheelchair distance: 50    Wheelchair 50 feet with 2 turns activity    Assist        Assist Level: Supervision/Verbal cueing   Wheelchair 150 feet activity     Assist      Assist Level: Minimal Assistance - Patient > 75%   Blood pressure 114/88, pulse 82, temperature 97.7 F (36.5 C), temperature source Oral, resp. rate 17, height 5\' 6"  (1.676 m), weight 64.4 kg, SpO2 96%.   Medical Problem List and Plan: 1. 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 Search for facility ongoing. May only be able to go to custodial care facility  D/c SNF-  Con't CIR PT, OT and SLP   P2P denied- appealed by family for 2nd time.  2. DVT/anticoagulation:  Pharmaceutical: Lovenox              -antiplatelet therapy: Aspirin  81 mg daily and Plavix  75 mg daily x 3 weeks then Plavix  alone 3. Pain Management: Tylenol  as needed  3/25- denies pain- con't regimen rpn   3-30: Ongoing left hip pain; Voltaren  gel and Tylenol .    4/3- having new burning pain in LUE- which is seen sometimes with stroke- will add Duloxetine  30 mg daily for nerve pain- monitor for side effects.  -if gets nausea, will  change to Lyrica 4/4-6 no side effects so far-  4/8- will increase Duloxetine  to 60 mg daily  DC hydrocodone  due to side effects, trial tyl #3  as needed for pain that is not relieved by Tylenol . 4/28- advised pt to use voltaren  gel  on all sides of L hand including between fingers- use tylenol  #3 as needed 4/29- pt admits doesn't like taking/using meds all the  time- explained I don't have a "fix"- to make it better- have to take meds to have them work 4/30- encouraged again to use voltaren  4x/day for L hand 5/1- pt said using voltaren  gel and tylenol  #3 as needed- adding lidoderm  patches 2 patches at night for back pain 5/2- HA's- will restart SCHEDULED Topamax  75 mg at bedtime- not sure what occurred? Needs to get nightly 5/3-4 appears improved with topamax  5/5- Will cont' topamax - hand still bothering her- not clear what type of pain- but tylenol  helps some.  5/6- will increase Duloxetine  to 120 mg daily to help with nerve pain and mood 5/8- pt reports pain so much better in last 24 hours- Duloxetine  kicking in?  5/9- hasn't asked for pain meds this Am including tylenol , so wants to not register the complaint" yet today.  4. Depression/Behavior/Sleep: Provide emotional support  4/21 mood improved--continue duloxetine              -antipsychotic agents: N/A 5. Neuropsych/cognition: This patient is capable of making decisions on her own behalf. 6. Skin/Wound Care: Routine skin checks 7. Fluids/Electrolytes/Nutrition:encouraging po.   4/21- I personally reviewed the patient's labs today.     -albumin still a bit low--encourage po/proteins. We have discussed improving intake 8.  Dysphagia.  Dysphagia 3 with thins currently  4/18 intake ok    9.  Hyperlipidemia.  Crestor  10.  Recent History of cocaine/tobacco use.  UDS positive for cocaine.  Provide counseling, this has been a chronic issue , seen in PCP notes 11.  Hypothyroidism. continue Synthroid   12.  Constipation.   Senokot-S 2 tab  twice daily  5/6- LBM yesterday even though chart says 5/3  5/7- will add Miralax - esp since increased Duloxetine  which can cause constipation- already on Senna 2 tabs BID  5/9- LBM documented as yesterday- medium BM 15. Blurry vision and dysconjugate gaze  3/27- will need Ophtho after d/c.   3-29: Seems to mostly be from the left eye, with difficulty converging and tracking; messaged OT and PT to trial taping versus alternating eye patch to help with convergence.  3-30: Reporting improvement with use of taping  4/4- pt reports everything blurry- cannot see faces as a result- never had eye checked in past- used readers but cannot see at a distance since stroke- will send to Ophtho after d/c.   4/7- d/w pt again- she wants eyeglasses- explained we cannot get when in hospital- also per guidelines, since vision can still change, to not get for 3 months after stroke. She disbelieves it's from stroke, since had 2 prior- that didn't cause vision changes- we educated pt pn how this can occur.   4/25- reminded pt cannot get Optho in hospital- need to get once d/c'd  4/30 went over visual changes with pt- will need to get glasses, likely prisms 2-3 months after d/c once vision stabilizes  5/6- d/w pt again- she wants to see Optho- explained they do not come into hospital for this 16.  Left hip pain.  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 Severe HA-daily HA's  4/15-16 sx  appear improved with topamax , tylenol  helps also   -may use sx to get out of therapy at times?  4/23- Per team, pt having HA's frequently which impairs  therapy- she won't discuss HA's with me- unclear if only has a day goes on vs self limting behavior  4/24- increase to 75 mg at bedtime  4/25- still having HA's, actually thinks a little worse since not taking Norco for it-   4/28-4/29 fewer complaints about HA this AM  4/30-5/1- - per staff, c/o these less- not interfering with therapy as much  5/2- Wasn't getting topamax - just taking T#3- wil restart Topamax - don't know what occurred with order  5/6- HA's doing better  5/8- not requiring T#3 anymore lately per pt 18. Decreased safety awareness/impulsiveness  4/30- still requiring max cues  5/7- per therapy- still requiring max cues 20. Spasticity  4/22- off baclofen , and c/o LUE pain- wondering how much of her complaints are due to tone?-   4/30- Spasticity improved in LUE- MAS of 0 actually this AM 21. 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 .   4/30- pt pulled scab off-  22.  Urinary  and bowel incont due to CVA, cont toileting program 4/23- still cannot control B/B 4/30- on toileting program- is getting somewhat better, but still incontinent more at night   23. 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   4/29- will get Dopplers, because pt says she elevated hand overnight- still swollen- ordered  4/30- Dopplers (-)- will get isotoner compression glove  5/4 received glove  5/6- Swelling ~ 50% better- will increase Duloxetine  to 120 mg daily for nerve pain in L hand and mood.   5/7- hand sore this AM- but not "killing her".   5/8- pain great this AM- doesn't need meds other than tylenol  per pt -sounds  like increased cymbalta  has helped  5/9- Some pain in L hand this Am, but hasn't taken tylenol  or T#3 yet this Am-  24. C/O catch in breath- no SOB  5/9- asked pt to use ICS every 1 hour while awake- since probably is atelectasis    I spent a total of  35   minutes on total care today- >50% coordination of care- due to  D/w pt and educated on atelectasis-as well as about pain- that meds wear off regularly.  Also spoke with pt's nursing about her care overnight     LOS: 46 days A FACE TO FACE EVALUATION WAS PERFORMED  Theresa Watkins 03/26/2024, 8:06 AM

## 2024-03-26 NOTE — Progress Notes (Signed)
 Speech Language Pathology Daily Session Note  Patient Details  Name: Theresa Watkins MRN: [id number] Date of Birth: 12-23-1965  Today's Date: 03/26/2024 SLP Individual Time: 6301-6010 SLP Individual Time Calculation (min): 20 min  Short Term Goals: Week 6: SLP Short Term Goal 1 (Week 6): STGs= LTGs due to ELOS  Skilled Therapeutic Interventions: Skilled therapy session focused on communication goals. SLP facilitated session by initiating use of EMST (expiratory muscle strength training) for 5 sets of 5 repetitions. SLP continued to target dysarthria through review of SLOP (slow, loud, over articulate, pause) speech strategies. With use of strategies given supervisionA, patient increased speech intelligibility to 90% at the conversational level. Patient left in bed with alarm set and call bell in reach. Continue POC.   Pain None reported   Therapy/Group: Individual Therapy  Kendel Pesnell F Hasson Gaspard 03/26/2024, 3:28 PM

## 2024-03-26 NOTE — Progress Notes (Signed)
 Occupational Therapy Weekly Progress Note  Patient Details  Name: Theresa Watkins MRN: [id number] Date of Birth: June 12, 1966  Beginning of progress report period: Mar 18, 2024 End of progress report period: Mar 26, 2024  Patient has met 1 of 2 short term goals.  Pt progress with BADLs has been slow and inconsistent during the past week. Transfers with min A but can be mod A 2/2 pt's ongoing impulsive behaviors during transitional movements. Bathing with min A seated on TTB. UB dressing mod A/min A. Pt inconsistently initiates use of LUE during funcitonal tasks. LB dressing with mod A sit<>stand from EOB. Pt required mod/max verbal cues for safety awareness. Pt has missed minutes of therapy 2/2 occasional lethargy/HA. Pt awaiting SNF placement.   Patient continues to demonstrate the following deficits: {impairments:3041632} and therefore will continue to benefit from skilled OT intervention to enhance overall performance with {ADL/iADL:3041649}.  Patient {LTG progression:3041653}.  {plan of MWUX:3244010}  OT Short Term Goals Week 6:  OT Short Term Goal 1 (Week 6): Pt will perform UB dressing tasks with min A OT Short Term Goal 1 - Progress (Week 6): Met OT Short Term Goal 2 (Week 6): Pt will perfrom toileting tasks with min A OT Short Term Goal 2 - Progress (Week 6): Progressing toward goal Week 7:  OT Short Term Goal 1 (Week 7): Pt will perfrom toileting tasks with min A OT Short Term Goal 2 (Week 7): Pt will use LUE as gross assist during functional tasks with mod A    [name] 03/26/2024, 8:35 AM

## 2024-03-26 NOTE — Progress Notes (Signed)
 Occupational Therapy Session Note  Patient Details  Name: Theresa Watkins MRN: [id number] Date of Birth: May 15, 1966  Today's Date: 03/26/2024 OT Individual Time: 0815-0900 OT Individual Time Calculation (min): 45 min    Short Term Goals: Week 7:  OT Short Term Goal 1 (Week 7): Pt will perfrom toileting tasks with min A OT Short Term Goal 2 (Week 7): Pt will use LUE as gross assist during functional tasks with mod A  Skilled Therapeutic Interventions/Progress Updates:    Pt resting in bed upon arrival and requesting to use toilet. Skilled OT intervention with focus on bed mobility, squat pivot/stand pivot transfers, toileting, bathing, dressing, and safety awareness to increase independence with BADLs. Supine>sit EOB with supervision. Squat pivot transfer to w/c with min A. Stand pivot transfer to toilet using grab bars with min A and mod A for safety. Pt completed bathing/dressing seated on toilet with sit<>stand for LB dressing. Pt continues to require mod verbal cues for safety awareness, particularly during transfionanl movements or when changing BOS. Pt is initiating use of LUE for some functional tasks but continues to require max A to consistently incorporate into functional tasks. Pt returned to bed and reamined in bed with all needs within reach. Bed alarm activated.   Therapy Documentation Precautions:  Precautions Precautions: Fall Precaution/Restrictions Comments: L hemi Restrictions Weight Bearing Restrictions Per Provider Order: No   Pain: Pt denies pain this morning   Therapy/Group: Individual Therapy  [name] 03/26/2024, 12:11 PM

## 2024-03-26 NOTE — Progress Notes (Signed)
 Physical Therapy Session Note  Patient Details  Name: Theresa Watkins MRN: [id number] Date of Birth: 1966-11-12  Today's Date: 03/26/2024 PT Individual Time: 1350-1420 PT Individual Time Calculation (min): 30 min   Short Term Goals: Week 6:  PT Short Term Goal 1 (Week 6): pt will ambulate 100 ft with hemi walker PT Short Term Goal 2 (Week 6): pt will perform bed/chair transfers with CGA both directions  Skilled Therapeutic Interventions/Progress Updates: Pt presented in w/c agreeable to therapy. Pt c/o HA 7/10, nsg aware and received pain meds during session. Pt transported to day room for time management. Focused on ambulation. Pt required minA to stand and ambulated ~57ft with minA. Pt requiring mod multimodal cues for sequencing. Pt noted to become increasingly impulsive with HW management at time attempting to take steps without HW support. Pt inconsistent with sequencing as well with PTA providing gentle cues. Pt then began to turn with pt placing HW significantly in front of pt limiting support. PTA adjusting support/guarding to allow pt to turn with pt becoming increasingly agitated. Pt verbalizing "take your hands off me", this therapist explaining necessity to guard. PTA ultimately placing chair behind pt instructing pt to sit. Pt stating would not sit until received meds. Nsg present advising cannot provide meds until sitting with pt ultimately sitting. After receiving meds w/c brought to chair, pt initially wanting to walk however ultimately agreeable to transfer to w/c. Pt continuing to demand that this therapist take hands off her with therapist stating cannot do that due to safety. Pt compelled stand pivot transfer to w/c and transported back to room. In room pt completed modified step pivot transfer to bed using bed rail. Pt completed sit to supine with supervision and use of bed features. Pt left in bed at end of session with call bell within reach and needs met.      Therapy  Documentation Precautions:  Precautions Precautions: Fall Precaution/Restrictions Comments: L hemi Restrictions Weight Bearing Restrictions Per Provider Order: No General:   Vital Signs: Therapy Vitals Temp: 97.6 F (36.4 C) Temp Source: Oral Pulse Rate: 95 Resp: 18 BP: 117/78 Patient Position (if appropriate): Sitting Oxygen Therapy SpO2: 100 % O2 Device: Room Air Pain: Pain Assessment Pain Scale: 0-10 Pain Score: 8  Pain Location: Head Pain Intervention(s): Pain med given for lower pain score than stated, per patient request;Repositioned;Massage   Therapy/Group: Individual Therapy  Alexa Golebiewski 03/26/2024, 3:57 PM

## 2024-03-26 NOTE — Group Note (Deleted)
 Patient Details Name: KATHYE GEHLBACH MRN: [id number] DOB: 19-Aug-1966 Today's Date: 03/26/2024  Time Calculation:        Group Description: [employer]: Patient participated in [employer] with both OT and SLP with focus on self-feeding, dysphagia goals, cognitive goals, and appropriate social interaction within a group environment.    Individual level documentation: Patient participated in diner's club and required {assistance:28279} assist for functional use of {RIGHT/LEFT:20294} UE during self-feeding.   Patient participated in diner's club with focus on dysphagia goals. Patient consumed their lunch meal of {diet with liquids:28280}. Patient consumed meal {consistent/inconsistent:28281} overt s/s of aspiration and required {assistance:28279} assist for use of swallowing compensatory strategies. Recommend patient continue current diet.   Patient participated in diner's club with focus on cognitive goals. Patient required (drop down selection {assistance:28279} assist for selective attention in a mildly distracting environment. Patient also required {assistance:28279} assist for visual scanning to {RIGHT/LEFT:20294} field of environment. Patient also demonstrated appropriate social interaction within a group environment with overall {assistance:28279} assist.   Pain: Pain Assessment Pain Scale: 0-10 Pain Score: 8  Pain Location: Head Pain Intervention(s): Pain med given for lower pain score than stated, per patient request;Repositioned;Massage  Precautions:     [name] [facility] 03/26/2024, 3:52 PM

## 2024-03-26 NOTE — Plan of Care (Signed)
  Problem: Consults Goal: RH STROKE PATIENT EDUCATION Description: See Patient Education module for education specifics  Outcome: Progressing   Problem: RH BOWEL ELIMINATION Goal: RH STG MANAGE BOWEL WITH ASSISTANCE Description: STG Manage Bowel with toileting Assistance. Outcome: Progressing Goal: RH STG MANAGE BOWEL W/MEDICATION W/ASSISTANCE Description: STG Manage Bowel with Medication with mod I Assistance. Outcome: Progressing   Problem: RH SAFETY Goal: RH STG ADHERE TO SAFETY PRECAUTIONS W/ASSISTANCE/DEVICE Description: STG Adhere to Safety Precautions With cues Assistance/Device. Outcome: Progressing   Problem: RH PAIN MANAGEMENT Goal: RH STG PAIN MANAGED AT OR BELOW PT'S PAIN GOAL Description: Pain managed < 4 with prns Outcome: Progressing   Problem: RH KNOWLEDGE DEFICIT Goal: RH STG INCREASE KNOWLEDGE OF HYPERTENSION Description: Patient and mother will be able to manage HTN using educational resources for medications and dietary modification independently Outcome: Progressing

## 2024-03-26 NOTE — Progress Notes (Signed)
 Occupational Therapy Session Note  Patient Details  Name: TANESSA MANYGOATS MRN: [id number] Date of Birth: November 03, 1966  {CHL IP REHAB OT TIME CALCULATIONS:304400400}   Short Term Goals: Week 6:  OT Short Term Goal 1 (Week 7): Pt will perfrom toileting tasks with min A OT Short Term Goal 2 (Week 7): Pt will use LUE as gross assist during functional tasks with mod A  Skilled Therapeutic Interventions/Progress Updates:    Patient agreeable to participate in OT session. Reports *** pain level.   Patient participated in skilled OT session focusing on ***. Therapist facilitated/assessed/developed/educated/integrated/elicited *** in order to improve/facilitate/promote    Therapy Documentation Precautions:  Precautions Precautions: Fall Precaution/Restrictions Comments: L hemi Restrictions Weight Bearing Restrictions Per Provider Order: No  Therapy/Group: Individual Therapy  Carollee Circle, OTR/L,CBIS  Supplemental OT - [facility] and [facility] Secure Chat Preferred   03/26/2024, 9:33 PM

## 2024-03-26 NOTE — Group Note (Addendum)
 Patient Details Name: ZAARA SPROWL MRN: [id number] DOB: 1966-03-04 Today's Date: 04/06/2024  Time Calculation:        Group Description: [employer]: Patient participated in [employer] with both OT with focus on self-feeding, dysphagia goals, cognitive goals, and appropriate social interaction within a group environment.   Individual level documentation: Patient participated in diner's club and required min A for setup of meal. Pt requested to not use left Ue as stabilizer today due to pain. Pt introduced to a rocker knife to assist with cutting up food on her own to set up her meal.   Patient participated in diner's club with focus on dysphagia goals. Patient consumed their lunch meal of regular textures and thin liquids. Patient consumed meal without overt s/s of aspiration and required supervision assist for use of swallowing compensatory strategies. Recommend patient continue current diet.   Patient participated in diner's club with focus on cognitive goals. Patient required (drop down selection min assist for selective attention in a mildly distracting environment. Patient also required supervision assist for visual scanning to left field of environment. Patient also demonstrated appropriate social interaction within a group environment with overall supervision assist.   Pain: Pain Assessment Pain Scale: 0-10 Pain Score: 7  requested meds during lunch       [name] [facility] 04/06/2024, 11:08 AM

## 2024-03-27 DIAGNOSIS — I63511 Cerebral infarction due to unspecified occlusion or stenosis of right middle cerebral artery: Secondary | ICD-10-CM | POA: Diagnosis not present

## 2024-03-27 MED ORDER — POLYVINYL ALCOHOL 1.4 % OP SOLN: 2.0000 [drp] | OPHTHALMIC | Status: DC | PRN

## 2024-03-27 NOTE — Plan of Care (Signed)
  Problem: Consults Goal: RH STROKE PATIENT EDUCATION Description: See Patient Education module for education specifics  Outcome: Progressing   Problem: RH BOWEL ELIMINATION Goal: RH STG MANAGE BOWEL WITH ASSISTANCE Description: STG Manage Bowel with toileting Assistance. Outcome: Progressing Goal: RH STG MANAGE BOWEL W/MEDICATION W/ASSISTANCE Description: STG Manage Bowel with Medication with mod I Assistance. Outcome: Progressing   Problem: RH SAFETY Goal: RH STG ADHERE TO SAFETY PRECAUTIONS W/ASSISTANCE/DEVICE Description: STG Adhere to Safety Precautions With cues Assistance/Device. Outcome: Progressing   Problem: RH PAIN MANAGEMENT Goal: RH STG PAIN MANAGED AT OR BELOW PT'S PAIN GOAL Description: Pain managed < 4 with prns Outcome: Progressing   Problem: RH KNOWLEDGE DEFICIT Goal: RH STG INCREASE KNOWLEDGE OF HYPERTENSION Description: Patient and mother will be able to manage HTN using educational resources for medications and dietary modification independently Outcome: Progressing Goal: RH STG INCREASE KNOWLEDGE OF DYSPHAGIA/FLUID INTAKE Description: Patient and mother will be able to manage dysphagia using educational resources for medications and dietary modification independently Outcome: Progressing Goal: RH STG INCREASE KNOWLEGDE OF HYPERLIPIDEMIA Description: Patient and mother will be able to manage HLD using educational resources for medications and dietary modification independently Outcome: Progressing Goal: RH STG INCREASE KNOWLEDGE OF STROKE PROPHYLAXIS Description: Patient and mother will be able to manage secondary risks using educational resources for medications and dietary modification independently Outcome: Progressing   Problem: Consults Goal: RH GENERAL PATIENT EDUCATION Description: See Patient Education module for education specifics. Outcome: Progressing Goal: Skin Care Protocol Initiated - if Braden Score 18 or less Description: If consults are  not indicated, leave blank or document N/A Outcome: Progressing Goal: Nutrition Consult-if indicated Outcome: Progressing Goal: Diabetes Guidelines if Diabetic/Glucose > 140 Description: If diabetic or lab glucose is > 140 mg/dl - Initiate Diabetes/Hyperglycemia Guidelines & Document Interventions  Outcome: Progressing   Problem: RH BOWEL ELIMINATION Goal: RH STG MANAGE BOWEL WITH ASSISTANCE Description: STG Manage Bowel with Assistance. Outcome: Progressing Goal: RH STG MANAGE BOWEL W/MEDICATION W/ASSISTANCE Description: STG Manage Bowel with Medication with Assistance. Outcome: Progressing   Problem: RH BLADDER ELIMINATION Goal: RH STG MANAGE BLADDER WITH ASSISTANCE Description: STG Manage Bladder With Assistance Outcome: Progressing Goal: RH STG MANAGE BLADDER WITH MEDICATION WITH ASSISTANCE Description: STG Manage Bladder With Medication With Assistance. Outcome: Progressing Goal: RH STG MANAGE BLADDER WITH EQUIPMENT WITH ASSISTANCE Description: STG Manage Bladder With Equipment With Assistance Outcome: Progressing   Problem: RH SKIN INTEGRITY Goal: RH STG SKIN FREE OF INFECTION/BREAKDOWN Outcome: Progressing Goal: RH STG MAINTAIN SKIN INTEGRITY WITH ASSISTANCE Description: STG Maintain Skin Integrity With Assistance. Outcome: Progressing Goal: RH STG ABLE TO PERFORM INCISION/WOUND CARE W/ASSISTANCE Description: STG Able To Perform Incision/Wound Care With Assistance. Outcome: Progressing   Problem: RH SAFETY Goal: RH STG ADHERE TO SAFETY PRECAUTIONS W/ASSISTANCE/DEVICE Description: STG Adhere to Safety Precautions With Assistance/Device. Outcome: Progressing Goal: RH STG DECREASED RISK OF FALL WITH ASSISTANCE Description: STG Decreased Risk of Fall With Assistance. Outcome: Progressing   Problem: RH PAIN MANAGEMENT Goal: RH STG PAIN MANAGED AT OR BELOW PT'S PAIN GOAL Outcome: Progressing   Problem: RH KNOWLEDGE DEFICIT GENERAL Goal: RH STG INCREASE KNOWLEDGE OF  SELF CARE AFTER HOSPITALIZATION Outcome: Progressing

## 2024-03-27 NOTE — Plan of Care (Signed)
  Problem: Consults Goal: RH STROKE PATIENT EDUCATION Description: See Patient Education module for education specifics  Outcome: Progressing   Problem: RH BOWEL ELIMINATION Goal: RH STG MANAGE BOWEL WITH ASSISTANCE Description: STG Manage Bowel with toileting Assistance. Outcome: Progressing Goal: RH STG MANAGE BOWEL W/MEDICATION W/ASSISTANCE Description: STG Manage Bowel with Medication with mod I  Assistance. Outcome: Progressing   Problem: RH SAFETY Goal: RH STG ADHERE TO SAFETY PRECAUTIONS W/ASSISTANCE/DEVICE Description: STG Adhere to Safety Precautions With  cues Assistance/Device. Outcome: Progressing

## 2024-03-27 NOTE — Progress Notes (Signed)
 Physical Therapy Session Note  Patient Details  Name: Theresa Watkins MRN: [id number] Date of Birth: February 17, 1966  Today's Date: 03/27/2024 PT Individual Time: 1031-1055 PT Individual Time Calculation (min): 24 min   Short Term Goals: Week 5:  PT Short Term Goal 1 (Week 5): =LTGs d/t ELOS PT Short Term Goal 1 - Progress (Week 5): Met Week 6:  PT Short Term Goal 1 (Week 6): pt will ambulate 100 ft with hemi walker PT Short Term Goal 2 (Week 6): pt will perform bed/chair transfers with CGA both directions  Skilled Therapeutic Interventions/Progress Updates:   Received pt semi-reclined in bed, pt agreeable to PT treatment, and reported pain 6/10 in her head (premedicated). Session with emphasis on functional mobility/transfers, generalized strengthening and endurance, NMR, and WC mobility. Pt transferred semi-reclined<>sitting R EOB with HOB elevated and use of bedrails with supervision and increased time. Donned shoes with supervision in figure four position and stood with hemi walker and min A, then experienced posterior LOB onto bed. Pt reported Tylenol  makes her "groggy", and declined any ambulation.   Transferred into WC via stand<>pivot with hemi walker and min A (almost landing on WC armrest and with poor eccentric control) and performed WC mobility ~64ft using BLE and RUE with supervision and cues for L attention to avoid running into obstacles. Pt limited by fatigue, transported back to room dependently, and requested to return to bed. Transferred WC<>bed to R via stand<>pivot using bedrails and min A and sit<>supine with supervision. Pt able to scoot to [facility] to reposition with therapist stabilizing LLE. Concluded session with pt semi-reclined in bed, needs within reach, and bed alarm on.   Therapy Documentation Precautions:  Precautions Precautions: Fall Precaution/Restrictions Comments: L hemi Restrictions Weight Bearing Restrictions Per Provider Order: No  Therapy/Group: Individual  Therapy Nicolas Barren Zaunegger Nena Bank PT, DPT 03/27/2024, 7:07 AM

## 2024-03-27 NOTE — Progress Notes (Signed)
 Occupational Therapy Session Note  Patient Details  Name: Theresa Watkins MRN: [id number] Date of Birth: 14-Sep-1966  Today's Date: 03/27/2024 OT Individual Time: 0100-0145 OT Individual Time Calculation (min): 45 min    Short Term Goals: Week 1:  OT Short Term Goal 1 (Week 1): Pt will complete PROM with LUE without VC OT Short Term Goal 1 - Progress (Week 1): Progressing toward goal OT Short Term Goal 2 (Week 1): Pt will use LUE as stabilizer during ADL tasks with Mod A OT Short Term Goal 2 - Progress (Week 1): Met OT Short Term Goal 3 (Week 1): Pt will complete shower transfer at Mod A with LRAD OT Short Term Goal 3 - Progress (Week 1): Progressing toward goal OT Short Term Goal 4 (Week 1): Pt will compplete UB dressing with himi technique at Mod A OT Short Term Goal 4 - Progress (Week 1): Met  Skilled Therapeutic Interventions/Progress Updates:     Patient in bed resting at the time of arrival. The pt indicated that she was unable to sleep on last night with a pain response of 6  on a 0-10 for head ache pain.  The pt was in agreement with completing exercise task to improve upon functional Ind with BADL related task performance.  The pt was able to transfer from bed LOF to EOB with SBA using the arm of the w/c and the bed rail. The pt was able to transfer from EOB to the w/c CGA using the bed rail and the arm of the w/c.  The pt was able to incorporate BLE for propelling the w/c > 200 feet with rest breaks as needed, the pt require 3 rest breaks. The pt was transported to the [facility] gym and was able to complete shld flex and elbow extension exercises  2 sets of 10 with 1 rest break.  The pt indicated that she needed to go the the restroom, she was able to propel herself using her feet > 200 feet.  The pt was transported to the rest room and was able to come from sit to stand at w/c LOF with SBA using the grab bars.  The pt was able to turn and reposition herself in front of the commode and  lower her LB clothing items, pants with CGA.  At the close of the session, nursing was informed of the pt's current status and assisted with addressing all additional needs.  Therapy Documentation Precautions:  Precautions Precautions: Fall Precaution/Restrictions Comments: L hemi Restrictions Weight Bearing Restrictions Per Provider Order: No  Therapy/Group: Individual Therapy  Moises Ang 03/27/2024, 4:28 PM

## 2024-03-27 NOTE — Progress Notes (Signed)
 PROGRESS NOTE   Subjective/Complaints:  Pt reports eyes esp L eye is dry- was worried eye drops were "poson"-  Just woke up- now intermittent supervision to feed self.  Says LBM 2 days ago ,but will "probably go today".    ROS:   Pt denies SOB, abd pain, CP, N/V/C/D, and vision changes    Objective:   No results found.     No results for input(s): "WBC", "HGB", "HCT", "PLT" in the last 72 hours.       No results for input(s): "NA", "K", "CL", "CO2", "GLUCOSE", "BUN", "CREATININE", "CALCIUM " in the last 72 hours.        Intake/Output Summary (Last 24 hours) at 03/27/2024 1540 Last data filed at 03/27/2024 0800 Gross per 24 hour  Intake 355 ml  Output --  Net 355 ml            Physical Exam: Vital Signs Blood pressure 132/87, pulse 87, temperature 98.2 F (36.8 C), temperature source Oral, resp. rate 17, height 5\' 6"  (1.676 m), weight 64.4 kg, SpO2 95%.             General: awake, alert, appropriate, NAD HENT: conjugate gaze; oropharynx dry- constantly blinking- looks like L eye dry, but no eye "tremors" pt asked if was having some CV: regular rate and rhythm; no JVD Pulmonary: CTA B/L; no W/R/R- good air movement GI: soft, NT, ND, (+)BS- hypoactive Psychiatric: appropriate Neurological: Ox3 L hand in WHO- trace tone, not severe- glove in place as well- less swelling; moderate dysarthria- always worse first thing in AM MSK- swelling ~ 40-50% better after glove- cannot make fist at all- wearing glove and WHO on L hand- slightly TTP this AM throughout MAS of 0 in LUE Extremities: L hand with isotoner glove in place, still somewhat tender. Swelling decreased- doing better  Motor strength 0/5 LUE 3-/5 LLE 5/5 on right side   Dependent edema left hand and wrist.  No hypersensitivity to touch.  No temperature change. -sensory exam normal. Prior neuro assessment is c/w today's exam  03/27/2024.  Skin- L PIP of 2nd digit- has scab     Musculoskeletal:  No tenderness over the left wrist.   Assessment/Plan: 1. Functional deficits which require 3+ hours per day of interdisciplinary therapy in a comprehensive inpatient rehab setting. Physiatrist is providing close team supervision and 24 hour management of active medical problems listed below. Physiatrist and rehab team continue to assess barriers to discharge/monitor patient progress toward functional and medical goals  Care Tool:  Bathing    Body parts bathed by patient: Left arm, Chest, Abdomen, Front perineal area, Buttocks, Right upper leg, Left upper leg, Right lower leg, Left lower leg, Face   Body parts bathed by helper: Right arm Body parts n/a: Right arm   Bathing assist Assist Level: Minimal Assistance - Patient > 75%     Upper Body Dressing/Undressing Upper body dressing   What is the patient wearing?: Pull over shirt    Upper body assist Assist Level: Minimal Assistance - Patient > 75%    Lower Body Dressing/Undressing Lower body dressing      What is the patient wearing?: Pants, Underwear/pull up  Lower body assist Assist for lower body dressing: Minimal Assistance - Patient > 75%     Toileting Toileting    Toileting assist Assist for toileting: Moderate Assistance - Patient 50 - 74%     Transfers Chair/bed transfer  Transfers assist     Chair/bed transfer assist level: Minimal Assistance - Patient > 75%     Locomotion Ambulation   Ambulation assist      Assist level: Moderate Assistance - Patient 50 - 74% Assistive device: Walker-hemi Max distance: 28   Walk 10 feet activity   Assist     Assist level: Moderate Assistance - Patient - 50 - 74% Assistive device: Walker-hemi   Walk 50 feet activity   Assist Walk 50 feet with 2 turns activity did not occur: Safety/medical concerns (L hemiplegia and pt fatigue)  Assist level: Moderate Assistance - Patient -  50 - 74% Assistive device: Hand held assist    Walk 150 feet activity   Assist Walk 150 feet activity did not occur: Safety/medical concerns (L hemiplegia and pt fatigue)  Assist level: Minimal Assistance - Patient > 75% Assistive device: Walker-Eva    Walk 10 feet on uneven surface  activity   Assist Walk 10 feet on uneven surfaces activity did not occur: Safety/medical concerns (L hemiplegia and pt fatigue)         Wheelchair     Assist Is the patient using a wheelchair?: Yes Type of Wheelchair: Manual    Wheelchair assist level: Supervision/Verbal cueing Max wheelchair distance: 50    Wheelchair 50 feet with 2 turns activity    Assist        Assist Level: Supervision/Verbal cueing   Wheelchair 150 feet activity     Assist      Assist Level: Minimal Assistance - Patient > 75%   Blood pressure 132/87, pulse 87, temperature 98.2 F (36.8 C), temperature source Oral, resp. rate 17, height 5\' 6"  (1.676 m), weight 64.4 kg, SpO2 95%.   Medical Problem List and Plan: 1. 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 Search for facility ongoing. May only be able to go to custodial care facility  D/c SNF-  Con't CIR  Pt is now intermittent supervision per chart for eating- will double check with SLP this week 2. DVT/anticoagulation:  Pharmaceutical: Lovenox              -antiplatelet therapy: Aspirin  81 mg daily and Plavix  75 mg daily x 3 weeks then Plavix  alone 3. Pain Management: Tylenol  as needed  3/25- denies pain- con't regimen rpn   3-30: Ongoing left hip pain; Voltaren  gel and Tylenol .    4/3- having new burning pain in LUE- which is seen sometimes with stroke- will add Duloxetine  30  mg daily for nerve pain- monitor for side effects.  -if gets nausea, will change to Lyrica 4/4-6 no side effects so far-  4/8- will increase Duloxetine  to 60 mg daily  DC hydrocodone  due to side effects, trial tyl #3  as needed for pain that is not relieved by Tylenol . 4/28- advised pt to use voltaren  gel  on all sides of L hand including between fingers- use tylenol  #3  as needed 4/29- pt admits doesn't like taking/using meds all the time- explained I don't have a "fix"- to make it better- have to take meds to have them work 4/30- encouraged again to use voltaren  4x/day for L hand 5/1- pt said using voltaren  gel and tylenol  #3 as needed- adding lidoderm  patches 2 patches at night for back pain 5/2- HA's- will restart SCHEDULED Topamax  75 mg at bedtime- not sure what occurred? Needs to get nightly 5/3-4 appears improved with topamax  5/5- Will cont' topamax - hand still bothering her- not clear what type of pain- but tylenol  helps some.  5/6- will increase Duloxetine  to 120 mg daily to help with nerve pain and mood 5/8- pt reports pain so much better in last 24 hours- Duloxetine  kicking in?  5/9- hasn't asked for pain meds this Am including tylenol , so wants to not register the complaint" yet today.  4. Depression/Behavior/Sleep: Provide emotional support  4/21 mood improved--continue duloxetine              -antipsychotic agents: N/A 5. Neuropsych/cognition: This patient is capable of making decisions on her own behalf. 6. Skin/Wound Care: Routine skin checks 7. Fluids/Electrolytes/Nutrition:encouraging po.   4/21- I personally reviewed the patient's labs today.     -albumin still a bit low--encourage po/proteins. We have discussed improving intake 8.  Dysphagia.  Dysphagia 3 with thins currently  4/18 intake ok    9.  Hyperlipidemia.  Crestor  10.  Recent History of cocaine/tobacco use.  UDS positive for cocaine.  Provide counseling, this has been a chronic issue , seen in PCP notes 11.   Hypothyroidism. continue Synthroid   12.  Constipation.   Senokot-S 2 tab twice daily  5/6- LBM yesterday even though chart says 5/3  5/7- will add Miralax - esp since increased Duloxetine  which can cause constipation- already on Senna 2 tabs BID  5/9- LBM documented as yesterday- medium BM  5.10- LBM 2 days ago- thinks can go today 15. Blurry vision and dysconjugate gaze  3/27- will need Ophtho after d/c.   3-29: Seems to mostly be from the left eye, with difficulty converging and tracking; messaged OT and PT to trial taping versus alternating eye patch to help with convergence.  3-30: Reporting improvement with use of taping  4/4- pt reports everything blurry- cannot see faces as a result- never had eye checked in past- used readers but cannot see at a distance since stroke- will send to Ophtho after d/c.   4/7- d/w pt again- she wants eyeglasses- explained we cannot get when in hospital- also per guidelines, since vision can still change, to not get for 3 months after stroke. She disbelieves it's from stroke, since had 2 prior- that didn't cause vision changes- we educated pt pn how this can occur.   4/25- reminded pt cannot get Optho in hospital- need to get once d/c'd  4/30 went over visual changes with pt- will need to get glasses, likely prisms 2-3 months after d/c once vision stabilizes  5/6- d/w pt again- she wants to see Optho- explained they do not come into hospital for this 16.  Left hip pain.  -3-29 x-ray showing moderate degenerative joint disease of the hip; no fracture  -Continue Voltaren  gel to hip 4 times daily  -May benefit from steroid injection as an outpatient  3/31- started Vit D and Ca per pt request  - improved to an extent  4/24- Pt reports L hip still hurts when stands/works with therapy however don't want her on  opiates regularly due to can impair sensorium and she also ready has issues  17. Severe HA-daily HA's  4/15-16 sx appear improved with topamax , tylenol   helps also   -may use sx to get out of therapy at times?  4/23- Per team, pt having HA's frequently which impairs therapy- she won't discuss HA's with me- unclear if only has a day goes on vs self limting behavior  4/24- increase to 75 mg at bedtime  4/25- still having HA's, actually thinks a little worse since not taking Norco for it-   4/28-4/29 fewer complaints about HA this AM  4/30-5/1- - per staff, c/o these less- not interfering with therapy as much  5/2- Wasn't getting topamax - just taking T#3- wil restart Topamax - don't know what occurred with order  5/6- HA's doing better  5/8- not requiring T#3 anymore lately per pt 18. Decreased safety awareness/impulsiveness  4/30- still requiring max cues  5/7- 5/10- no change-per therapy- still requiring max cues 20. Spasticity  4/22- off baclofen , and c/o LUE pain- wondering how much of her complaints are due to tone?-   4/30- Spasticity improved in LUE- MAS of 0 actually this AM 21. 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 .   4/30- pt pulled scab off-  22.  Urinary  and bowel incont due to CVA, cont toileting program 4/23- still cannot control B/B 4/30- on toileting program- is getting somewhat better, but still incontinent more at night   23. 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   4/29- will get Dopplers, because pt says she elevated hand overnight- still swollen- ordered  4/30- Dopplers (-)- will get isotoner compression glove  5/4 received glove  5/6- Swelling ~ 50% better- will increase Duloxetine  to 120 mg daily for nerve pain in L hand and mood.    5/7- hand sore this AM- but not "killing her".   5/8- pain great this AM- doesn't need meds other than tylenol  per pt -sounds like increased cymbalta  has helped  5/9- Some pain in L hand this Am, but hasn't taken tylenol  or T#3 yet this Am-  24. C/O catch in breath- no SOB  5/9- asked pt to use ICS every 1 hour while awake- since probably is atelectasis  25. Dry eyes  5/10- ordered eye drops as needed for pt    LOS: 47 days A FACE TO FACE EVALUATION WAS PERFORMED  Sumi Lye 03/27/2024, 3:40 PM

## 2024-03-28 DIAGNOSIS — I63511 Cerebral infarction due to unspecified occlusion or stenosis of right middle cerebral artery: Secondary | ICD-10-CM | POA: Diagnosis not present

## 2024-03-28 MED ORDER — PREGABALIN 50 MG PO CAPS
50.0000 mg | ORAL_CAPSULE | Freq: Two times a day (BID) | ORAL | Status: DC
  Administered 2024-03-28 – 2024-03-29 (×2): 50 mg via ORAL
  Filled 2024-03-28 (×3): qty 1

## 2024-03-28 NOTE — Progress Notes (Signed)
 Physical Therapy Session Note  Patient Details  Name: Theresa Watkins MRN: [id number] Date of Birth: [birth year]  Today's Date: 03/28/2024 PT Individual Time: 1400-1433 PT Individual Time Calculation (min): 33 min  and Today's Date: 03/28/2024 PT Missed Time: 12 Minutes Missed Time Reason: Patient fatigue;Patient unwilling to participate  Short Term Goals: Week 6:  PT Short Term Goal 1 (Week 6): pt will ambulate 100 ft with hemi walker PT Short Term Goal 2 (Week 6): pt will perform bed/chair transfers with CGA both directions  Skilled Therapeutic Interventions/Progress Updates:      Pt supine asleep in bed upon arrival. Pt awoken and agreeable to therapy. Pt denies any pain, however reports lightheadedness/nausea with standing and migraine. Notifed nurse and MD.   Toribio Frees assessed: BP 117/80 HR 93 RA O2 97  Pt appeears more groggy in comparison to previous sessions with this therapist.   Pt attempting to do 180 deg turn with B UE support on WC versus stand pivot transfer for bed to chair, verbal/tactile cues provided for technique and min A.   Pt self propelled WC main gym to room with R UE/R LE and min-mod A.   Pt ambulated ~10 feet with hemi walker and min A with +2 for close WC follow 2/2 groggyness. Pt reports feeling lightheaded/dizzy. Vitals assessed (see above). Pt requesting to return to room. Pt transported dependent in [facility] to room. Pt performed stand pivot transfer WC to bed with supervision.   Pt supine in bed with all needs within reach and bed alarm on. Pt missed 12 minutes of skilled PT intervention 2/2 lightheadedness/dizziness/fatigue. Will attempt to make up missed time as able.   Therapy Documentation Precautions:  Precautions Precautions: Fall Precaution/Restrictions Comments: L hemi Restrictions Weight Bearing Restrictions Per Provider Order: No  Therapy/Group: Individual Therapy  [facility] Jenney Modest, [place], DPT  03/28/2024, 2:46 PM

## 2024-03-28 NOTE — Progress Notes (Signed)
 Occupational Therapy Session Note  Patient Details  Name: Theresa Watkins MRN: [id number] Date of Birth: 1966/03/25  Today's Date: 03/28/2024 OT Individual Time: 2952-8413 OT Individual Time Calculation (min): 45 min    Short Term Goals: Week 1:  OT Short Term Goal 1 (Week 1): Pt will complete PROM with LUE without VC OT Short Term Goal 1 - Progress (Week 1): Progressing toward goal OT Short Term Goal 2 (Week 1): Pt will use LUE as stabilizer during ADL tasks with Mod A OT Short Term Goal 2 - Progress (Week 1): Met OT Short Term Goal 3 (Week 1): Pt will complete shower transfer at Mod A with LRAD OT Short Term Goal 3 - Progress (Week 1): Progressing toward goal OT Short Term Goal 4 (Week 1): Pt will compplete UB dressing with himi technique at Mod A OT Short Term Goal 4 - Progress (Week 1): Met  Skilled Therapeutic Interventions/Progress Updates:    Patient in bed at the time of arrival. Patient reports being able to rest during the night, however, at the time of treatment she reported having a head ache and that she needed to go to the restroom.  The pt was able to transfer from bed LOF to EOB with MinA for managing BLE. The pt was able to donn her shoes with SBA. The pt was able to t/f from EOB to  w/c LOF  with MinA and additional time by using the arm or the w/c and the bed.  The pt was transported to the restroom and was able to transfer from sit to stand with MinA using the arm of the w/c and the grab bar.  The pt was able to adjust her position for placement in front of the commode by using the grab bar and arm of the w/c.  The pt was able to   manage LB garments, consisting of a  pull-up and pants with CGA. The pt was able to lower herself to the commode with CGA using the grab bar. The pt was able to complete toileting hygiene with s/uA.  The pt was able to come from sit to stand for transfer to the w/c using the grab bar at [facility].  The pt was placed at the entrance of the shower and  she was able to incorporate the grab bar for coming from sit to stand with MinA for placement onto the shower chair at CGA.  The pt was able to complete UB/LB bathing with s/u A. The pt was able to dry off and apply deodorant and lotion with s/u A.  The pt was transferred from the shower chair to the w/c with MinA using the grab bar and the arm of the w/c.  Upon exiting the bathroom, the NT was able to assist the pt with completing UB/LB dressing and all remaining additional needs.   Therapy Documentation Precautions:  Precautions Precautions: Fall Precaution/Restrictions Comments: L hemi Restrictions Weight Bearing Restrictions Per Provider Order: No   Other Treatments:     Therapy/Group: Individual Therapy  Moises Ang 03/28/2024, 12:52 PM

## 2024-03-28 NOTE — Plan of Care (Signed)
  Problem: Consults Goal: RH STROKE PATIENT EDUCATION Description: See Patient Education module for education specifics  Outcome: Progressing   Problem: RH BOWEL ELIMINATION Goal: RH STG MANAGE BOWEL WITH ASSISTANCE Description: STG Manage Bowel with toileting Assistance. Outcome: Progressing Goal: RH STG MANAGE BOWEL W/MEDICATION W/ASSISTANCE Description: STG Manage Bowel with Medication with mod I Assistance. Outcome: Progressing   Problem: RH SAFETY Goal: RH STG ADHERE TO SAFETY PRECAUTIONS W/ASSISTANCE/DEVICE Description: STG Adhere to Safety Precautions With cues Assistance/Device. Outcome: Progressing   Problem: RH PAIN MANAGEMENT Goal: RH STG PAIN MANAGED AT OR BELOW PT'S PAIN GOAL Description: Pain managed < 4 with prns Outcome: Progressing   Problem: RH KNOWLEDGE DEFICIT Goal: RH STG INCREASE KNOWLEDGE OF HYPERTENSION Description: Patient and mother will be able to manage HTN using educational resources for medications and dietary modification independently Outcome: Progressing Goal: RH STG INCREASE KNOWLEDGE OF DYSPHAGIA/FLUID INTAKE Description: Patient and mother will be able to manage dysphagia using educational resources for medications and dietary modification independently Outcome: Progressing

## 2024-03-28 NOTE — Progress Notes (Signed)
 Occupational Therapy Session Note  Patient Details  Name: Theresa Watkins MRN: [id number] Date of Birth: 25-Dec-1965  Today's Date: 03/28/2024 OT Individual Time: 1115-1200 OT Individual Time Calculation (min): 45 min    Short Term Goals: Week 6:  OT Short Term Goal 1 (Week 6): Pt will perform UB dressing tasks with min A OT Short Term Goal 1 - Progress (Week 6): Met OT Short Term Goal 2 (Week 6): Pt will perfrom toileting tasks with min A OT Short Term Goal 2 - Progress (Week 6): Progressing toward goal  Skilled Therapeutic Interventions/Progress Updates:    Pt resting in bed upon arrival. Pt already dressed from earlier therapy. Pt c/o HA during session but states she already received meds. OT intervention with focus on bed positioning using BUE to push in bed. Pt performed bridges 3x5 with adequate clearance. Pt able to push self to [facility] using BLE only. LUE functional reaching against gravity with support at wrist, pushing towards ceiling 4x5. Pt remained in bed with all needs within reach. Bed alarm activated.   Therapy Documentation Precautions:  Precautions Precautions: Fall Precaution/Restrictions Comments: L hemi Restrictions Weight Bearing Restrictions Per Provider Order: No   Pain:  Pt reports her LUE/hand is feeling better; ace wrap applied (pt declined compression glove)   Therapy/Group: Individual Therapy  [name] 03/28/2024, 12:19 PM

## 2024-03-28 NOTE — Progress Notes (Signed)
 PROGRESS NOTE   Subjective/Complaints:  Pt doesn't want gabapentin  because causes sedation and loopiness, but nerve pain and stinging/burning bad still in L hand.   Also 3rd and 4th fingers won't extend without pain.   ROS:    Pt denies SOB, abd pain, CP, N/V/C/D, and vision changes    Objective:   No results found.     No results for input(s): "WBC", "HGB", "HCT", "PLT" in the last 72 hours.       No results for input(s): "NA", "K", "CL", "CO2", "GLUCOSE", "BUN", "CREATININE", "CALCIUM " in the last 72 hours.        Intake/Output Summary (Last 24 hours) at 03/28/2024 1218 Last data filed at 03/27/2024 2115 Gross per 24 hour  Intake 237 ml  Output --  Net 237 ml            Physical Exam: Vital Signs Blood pressure 129/79, pulse 72, temperature 98.4 F (36.9 C), temperature source Oral, resp. rate 17, height 5\' 6"  (1.676 m), weight 64.4 kg, SpO2 96%.              General: awake, alert, appropriate, feeding self, but taking too big of bites;  NAD HENT: conjugate gaze; oropharynx moist CV: regular rate and rhythm; no JVD Pulmonary: CTA B/L; no W/R/R- good air movement GI: soft, NT, ND, (+)BS Psychiatric: appropriate MSK- difficult to extend L 3rd/4th digits at PIP on L hand- painful for pt- forming spasticity? Neurological: Ox3  L hand in WHO- trace tone, not severe- glove in place as well- less swelling; moderate dysarthria- always worse first thing in AM MSK- swelling ~ 40-50% better after glove- cannot make fist at all- wearing glove and WHO on L hand- slightly TTP this AM throughout MAS of 0 in LUE Extremities: L hand with isotoner glove in place, still somewhat tender. Swelling decreased- doing better  Motor strength 0/5 LUE 3-/5 LLE 5/5 on right side   Dependent edema left hand and wrist.  No hypersensitivity to touch.  No temperature change. -sensory exam normal. Prior  neuro assessment is c/w today's exam 03/28/2024.  Skin- L PIP of 2nd digit- has scab     Musculoskeletal:  No tenderness over the left wrist.   Assessment/Plan: 1. Functional deficits which require 3+ hours per day of interdisciplinary therapy in a comprehensive inpatient rehab setting. Physiatrist is providing close team supervision and 24 hour management of active medical problems listed below. Physiatrist and rehab team continue to assess barriers to discharge/monitor patient progress toward functional and medical goals  Care Tool:  Bathing    Body parts bathed by patient: Left arm, Chest, Abdomen, Front perineal area, Buttocks, Right upper leg, Left upper leg, Right lower leg, Left lower leg, Face   Body parts bathed by helper: Right arm Body parts n/a: Right arm   Bathing assist Assist Level: Minimal Assistance - Patient > 75%     Upper Body Dressing/Undressing Upper body dressing   What is the patient wearing?: Pull over shirt    Upper body assist Assist Level: Minimal Assistance - Patient > 75%    Lower Body Dressing/Undressing Lower body dressing      What is the  patient wearing?: Pants, Underwear/pull up     Lower body assist Assist for lower body dressing: Minimal Assistance - Patient > 75%     Toileting Toileting    Toileting assist Assist for toileting: Moderate Assistance - Patient 50 - 74%     Transfers Chair/bed transfer  Transfers assist     Chair/bed transfer assist level: Minimal Assistance - Patient > 75%     Locomotion Ambulation   Ambulation assist      Assist level: Moderate Assistance - Patient 50 - 74% Assistive device: Walker-hemi Max distance: 28   Walk 10 feet activity   Assist     Assist level: Moderate Assistance - Patient - 50 - 74% Assistive device: Walker-hemi   Walk 50 feet activity   Assist Walk 50 feet with 2 turns activity did not occur: Safety/medical concerns (L hemiplegia and pt fatigue)  Assist  level: Moderate Assistance - Patient - 50 - 74% Assistive device: Hand held assist    Walk 150 feet activity   Assist Walk 150 feet activity did not occur: Safety/medical concerns (L hemiplegia and pt fatigue)  Assist level: Minimal Assistance - Patient > 75% Assistive device: Walker-Eva    Walk 10 feet on uneven surface  activity   Assist Walk 10 feet on uneven surfaces activity did not occur: Safety/medical concerns (L hemiplegia and pt fatigue)         Wheelchair     Assist Is the patient using a wheelchair?: Yes Type of Wheelchair: Manual    Wheelchair assist level: Supervision/Verbal cueing Max wheelchair distance: 50    Wheelchair 50 feet with 2 turns activity    Assist        Assist Level: Supervision/Verbal cueing   Wheelchair 150 feet activity     Assist      Assist Level: Minimal Assistance - Patient > 75%   Blood pressure 129/79, pulse 72, temperature 98.4 F (36.9 C), temperature source Oral, resp. rate 17, height 5\' 6"  (1.676 m), weight 64.4 kg, SpO2 96%.   Medical Problem List and Plan: 1. 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 Search for facility ongoing. May only be able to go to custodial care facility  D/c SNF-  Con't CIR  Pt is now intermittent supervision per chart for eating- will double check with SLP this week  Taking too big of bites 2. DVT/anticoagulation:  Pharmaceutical: Lovenox              -antiplatelet therapy: Aspirin  81 mg daily and Plavix  75 mg daily x 3 weeks then Plavix  alone 3. Pain Management: Tylenol  as needed  3/25- denies pain- con't regimen rpn   3-30: Ongoing left hip pain; Voltaren  gel and Tylenol .    4/3- having new burning pain in  LUE- which is seen sometimes with stroke- will add Duloxetine  30 mg daily for nerve pain- monitor for side effects.  -if gets nausea, will change to Lyrica 4/4-6 no side effects so far-  4/8- will increase Duloxetine  to 60 mg daily  DC hydrocodone  due to side effects, trial tyl #3  as needed for pain that is not relieved by Tylenol . 4/28- advised pt to use  voltaren  gel  on all sides of L hand including between fingers- use tylenol  #3 as needed 4/29- pt admits doesn't like taking/using meds all the time- explained I don't have a "fix"- to make it better- have to take meds to have them work 4/30- encouraged again to use voltaren  4x/day for L hand 5/1- pt said using voltaren  gel and tylenol  #3 as needed- adding lidoderm  patches 2 patches at night for back pain 5/2- HA's- will restart SCHEDULED Topamax  75 mg at bedtime- not sure what occurred? Needs to get nightly 5/3-4 appears improved with topamax  5/5- Will cont' topamax - hand still bothering her- not clear what type of pain- but tylenol  helps some.  5/6- will increase Duloxetine  to 120 mg daily to help with nerve pain and mood 5/8- pt reports pain so much better in last 24 hours- Duloxetine  kicking in? 5/9- hasn't asked for pain meds this Am including tylenol , so wants to not register the complaint" yet today.  51/11- Add Lyrica 50 mg BID for nerve pain in L hand 4. Depression/Behavior/Sleep: Provide emotional support  4/21 mood improved--continue duloxetine              -antipsychotic agents: N/A 5. Neuropsych/cognition: This patient is capable of making decisions on her own behalf. 6. Skin/Wound Care: Routine skin checks 7. Fluids/Electrolytes/Nutrition:encouraging po.   4/21- I personally reviewed the patient's labs today.     -albumin still a bit low--encourage po/proteins. We have discussed improving intake 8.  Dysphagia.  Dysphagia 3 with thins currently  4/18 intake ok 5/11- intake good, but takes too big of bites    9.   Hyperlipidemia.  Crestor  10.  Recent History of cocaine/tobacco use.  UDS positive for cocaine.  Provide counseling, this has been a chronic issue , seen in PCP notes 11.  Hypothyroidism. continue Synthroid   12.  Constipation.   Senokot-S 2 tab twice daily  5/6- LBM yesterday even though chart says 5/3  5/7- will add Miralax - esp since increased Duloxetine  which can cause constipation- already on Senna 2 tabs BID  5/9- LBM documented as yesterday- medium BM  5.10- LBM 2 days ago- thinks can go today  5/11- LBM 2 days ago per chart 15. Blurry vision and dysconjugate gaze  3/27- will need Ophtho after d/c.   3-29: Seems to mostly be from the left eye, with difficulty converging and tracking; messaged OT and PT to trial taping versus alternating eye patch to help with convergence.  3-30: Reporting improvement with use of taping  4/4- pt reports everything blurry- cannot see faces as a result- never had eye checked in past- used readers but cannot see at a distance since stroke- will send to Ophtho after d/c.   4/7- d/w pt again- she wants eyeglasses- explained we cannot get when in hospital- also per guidelines, since vision can still change, to not get for 3 months after stroke. She disbelieves it's from stroke, since had 2 prior- that didn't cause vision changes- we educated pt pn how this can occur.   4/25- reminded pt cannot get Optho in hospital- need to get once d/c'd  4/30 went over visual changes with pt- will need to get glasses, likely prisms 2-3 months after d/c once vision stabilizes  5/6- d/w pt again- she wants to see Optho- explained they do not come into hospital for this 16.  Left hip pain.  -3-29 x-ray showing moderate degenerative joint disease of the hip; no fracture  -Continue Voltaren  gel to hip 4 times  daily  -May benefit from steroid injection as an outpatient  3/31- started Vit D and Ca per pt request  - improved to an extent  4/24- Pt reports L hip still hurts when  stands/works with therapy however don't want her on opiates regularly due to can impair sensorium and she also ready has issues  17. Severe HA-daily HA's  4/15-16 sx appear improved with topamax , tylenol  helps also   -may use sx to get out of therapy at times?  4/23- Per team, pt having HA's frequently which impairs therapy- she won't discuss HA's with me- unclear if only has a day goes on vs self limting behavior  4/24- increase to 75 mg at bedtime  4/25- still having HA's, actually thinks a little worse since not taking Norco for it-   4/28-4/29 fewer complaints about HA this AM  4/30-5/1- - per staff, c/o these less- not interfering with therapy as much  5/2- Wasn't getting topamax - just taking T#3- wil restart Topamax - don't know what occurred with order  5/6- HA's doing better  5/8- not requiring T#3 anymore lately per pt 18. Decreased safety awareness/impulsiveness  4/30- still requiring max cues  5/7- 5/10- no change-per therapy- still requiring max cues 20. Spasticity  4/22- off baclofen , and c/o LUE pain- wondering how much of her complaints are due to tone?-   4/30- Spasticity improved in LUE- MAS of 0 actually this AM  5/11- Getting some spasticity vs disuse tightness inL hand- fingers esp 3rd/4th DIPs- might need Botox after d/c 21. 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 .   4/30- pt pulled scab off-  22.  Urinary  and bowel incont due to CVA, cont toileting program 4/23- still cannot control B/B 4/30- on toileting program- is getting somewhat better, but still incontinent more at night   23. 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   4/29- will get Dopplers, because pt says she elevated hand overnight- still swollen- ordered  4/30- Dopplers (-)- will get isotoner compression glove  5/4 received glove  5/6- Swelling ~ 50% better- will increase Duloxetine  to 120 mg daily for nerve pain in L hand and mood.   5/7- hand sore this AM- but not "killing her".   5/8- pain great this AM- doesn't need meds other than tylenol  per pt -sounds like increased cymbalta  has helped  5/9- Some pain in L hand this Am, but hasn't taken tylenol  or T#3 yet this Am-  24. C/O catch in breath- no SOB  5/9- asked pt to use ICS every 1 hour while awake- since probably is atelectasis  25. Dry eyes  5/10- ordered eye drops as needed for pt   I spent a total of  35  minutes on total care today- >50% coordination of care- due to  D/w pt about spasticity and Botox as well as adding Lyrica- not gabapenitn for nerve pain  LOS: 48 days A FACE TO FACE EVALUATION WAS PERFORMED  Lautaro Koral 03/28/2024, 12:18 PM

## 2024-03-29 DIAGNOSIS — R252 Cramp and spasm: Secondary | ICD-10-CM | POA: Diagnosis not present

## 2024-03-29 DIAGNOSIS — R1312 Dysphagia, oropharyngeal phase: Secondary | ICD-10-CM | POA: Diagnosis not present

## 2024-03-29 DIAGNOSIS — I63511 Cerebral infarction due to unspecified occlusion or stenosis of right middle cerebral artery: Secondary | ICD-10-CM | POA: Diagnosis not present

## 2024-03-29 DIAGNOSIS — M792 Neuralgia and neuritis, unspecified: Secondary | ICD-10-CM | POA: Diagnosis not present

## 2024-03-29 LAB — COMPREHENSIVE METABOLIC PANEL WITH GFR
ALT: 23 U/L (ref 0–44)
AST: 15 U/L (ref 15–41)
Albumin: 3.3 g/dL — ABNORMAL LOW (ref 3.5–5.0)
Alkaline Phosphatase: 54 U/L (ref 38–126)
Anion gap: 9 (ref 5–15)
BUN: 17 mg/dL (ref 6–20)
CO2: 22 mmol/L (ref 22–32)
Calcium: 8.1 mg/dL — ABNORMAL LOW (ref 8.9–10.3)
Chloride: 110 mmol/L (ref 98–111)
Creatinine, Ser: 0.82 mg/dL (ref 0.44–1.00)
GFR, Estimated: >60 mL/min (ref >60)
Glucose, Bld: 83 mg/dL (ref 70–99)
Potassium: 3.8 mmol/L (ref 3.5–5.1)
Sodium: 141 mmol/L (ref 135–145)
Total Bilirubin: 0.2 mg/dL (ref 0.0–1.2)
Total Protein: 6 g/dL — ABNORMAL LOW (ref 6.5–8.1)

## 2024-03-29 LAB — CBC WITH DIFFERENTIAL/PLATELET
Abs Immature Granulocytes: 0.02 10*3/uL (ref 0.00–0.07)
Basophils Absolute: 0 10*3/uL (ref 0.0–0.1)
Basophils Relative: 0 %
Eosinophils Absolute: 0.1 10*3/uL (ref 0.0–0.5)
Eosinophils Relative: 3 %
HCT: 41.3 % (ref 36.0–46.0)
Hemoglobin: 13.7 g/dL (ref 12.0–15.0)
Immature Granulocytes: 0 %
Lymphocytes Relative: 34 %
Lymphs Abs: 1.6 10*3/uL (ref 0.7–4.0)
MCH: 28.8 pg (ref 26.0–34.0)
MCHC: 33.2 g/dL (ref 30.0–36.0)
MCV: 86.9 fL (ref 80.0–100.0)
Monocytes Absolute: 0.4 10*3/uL (ref 0.1–1.0)
Monocytes Relative: 9 %
Neutro Abs: 2.6 10*3/uL (ref 1.7–7.7)
Neutrophils Relative %: 54 %
Platelets: 172 10*3/uL (ref 150–400)
RBC: 4.75 MIL/uL (ref 3.87–5.11)
RDW: 13.2 % (ref 11.5–15.5)
WBC: 4.8 10*3/uL (ref 4.0–10.5)
nRBC: 0 % (ref 0.0–0.2)

## 2024-03-29 NOTE — Progress Notes (Signed)
 Physical Therapy Weekly Progress Note  Patient Details  Name: Theresa Watkins MRN: [id number] Date of Birth: 11/21/1965  Beginning of progress report period: Mar 19, 2024 End of progress report period: Mar 29, 2024  Today's Date: 03/29/2024 PT Individual Time: 4098-1191 PT Individual Time Calculation (min): 29 min   Patient has met 2 of 2 short term goals.  [name] is making slow but steady progress with transfers and gait. Focus this week has been on using hemi walker for improved independence and longer distances. Pt is using bed rails and grab bars for Stand pivot transfer with good success and improving safety, but she is still limited by impulsivity that will require full time supervision at d/c.   Patient continues to demonstrate the following deficits muscle weakness and muscle joint tightness, abnormal tone, ataxia, decreased coordination, and decreased motor planning, and decreased standing balance, hemiplegia, and decreased balance strategies and therefore will continue to benefit from skilled PT intervention to increase functional independence with mobility.  Patient progressing toward long term goals..  Continue plan of care.  PT Short Term Goals Week 6:  PT Short Term Goal 1 (Week 6): pt will ambulate 100 ft with hemi walker PT Short Term Goal 1 - Progress (Week 6): Met PT Short Term Goal 2 (Week 6): pt will perform bed/chair transfers with CGA both directions PT Short Term Goal 2 - Progress (Week 6): Met Week 7:  PT Short Term Goal 1 (Week 7): pt will initiate stair navigation PT Short Term Goal 2 (Week 7): pt will initiate fall recovery training  Skilled Therapeutic Interventions/Progress Updates:  Pt recd in bed and declines OOB mobility d/t grogginess after starting new medication. Pt agreeable to bed level therEX for UE strength. Reports 6/10 HA and LUE pain, premedicated. Rest and positioning provided as needed.  Pt participated in UE exercise with YTB anchored on  bed rail while discussing goals and plan for coming week. Cues for technique, especially using full ROM. Discussed plan for further gait training, stair navigation, and fall recovery training. Also provided education on using L hemibody as much as possible to increase chances of return and manage UE swelling.   Pt remained in bed and was left with all needs in reach and alarm active.   Therapy Documentation Precautions:  Precautions Precautions: Fall Precaution/Restrictions Comments: L hemi Restrictions Weight Bearing Restrictions Per Provider Order: No General:      Therapy/Group: Individual Therapy  Tex Filbert 03/29/2024, 10:31 AM

## 2024-03-29 NOTE — Progress Notes (Signed)
 Speech Language Pathology Daily Session Note  Patient Details  Name: Theresa Watkins MRN: [id number] Date of Birth: 17-Apr-1966  Today's Date: 03/29/2024 SLP Individual Time: 1302-1330 SLP Individual Time Calculation (min): 28 min  Short Term Goals: Week 7: SLP Short Term Goal 1 (Week 7): Patient will utilize swallowing compensatory strategies to reduce s/sx of aspiration during consumption of PO given sup multimodal A SLP Short Term Goal 2 (Week 7): Patient will communicate complex information with 90% speech intelligibility with sup cues  Skilled Therapeutic Interventions:  Patient was seen in am to address speech intelligibility. Pt was alert and seen at bedside upon SLP arrival. Pt reported pain in left hand however, agreeable for session. SLP addressed use of compensatory strategies during conversational exchanges. SLP recommending pt reposition upright to maximize speech intelligibility. Pt initially 70% intelligible however able to improve intelligibility to 90% as session persisted. Pt able to teach back information with supervision. Pt left in bed with call button within reach and bed alarm active. SLP to continue POC.   Pain Pain Assessment Pain Scale: 0-10 Pain Score: 8   Therapy/Group: Individual Therapy  Adela Holter 03/29/2024, 3:41 PM

## 2024-03-29 NOTE — Progress Notes (Signed)
 PROGRESS NOTE   Subjective/Complaints:  Pt states that medication started last night helped but that she felt groggy this morning. Wants to continue, however  ROS: Patient denies fever, rash, sore throat, blurred vision, dizziness, nausea, vomiting, diarrhea, cough, shortness of breath or chest pain,  headache, or mood change.     Objective:   No results found.     Recent Labs    03/29/24 0554  WBC 4.8  HGB 13.7  HCT 41.3  PLT 172         Recent Labs    03/29/24 0554  NA 141  K 3.8  CL 110  CO2 22  GLUCOSE 83  BUN 17  CREATININE 0.82  CALCIUM  8.1*          Intake/Output Summary (Last 24 hours) at 03/29/2024 1025 Last data filed at 03/29/2024 0835 Gross per 24 hour  Intake 720 ml  Output --  Net 720 ml            Physical Exam: Vital Signs Blood pressure 119/79, pulse 94, temperature 98 F (36.7 C), resp. rate 16, height 5\' 6"  (1.676 m), weight 72.3 kg, SpO2 100%.  Constitutional: No distress . Vital signs reviewed. HEENT: NCAT, EOMI, oral membranes moist Neck: supple Cardiovascular: RRR without murmur. No JVD    Respiratory/Chest: CTA Bilaterally without wheezes or rales. Normal effort    GI/Abdomen: BS +, non-tender, non-distended Ext: no clubbing, cyanosis Psych: pleasant and cooperative  MSK- left hand sl tender. Wrapped with ACE, ~1+ edema Neuro: cognitively at baseline. Alert. Sl dysarthria. CN without changes. MAS of 0-1 in LUE Motor strength 0/5 LUE 3-/5 LLE 5/5 on right side  Skin- L PIP of 2nd digit- healing scab     Assessment/Plan: 1. Functional deficits which require 3+ hours per day of interdisciplinary therapy in a comprehensive inpatient rehab setting. Physiatrist is providing close team supervision and 24 hour management of active medical problems listed below. Physiatrist and rehab team continue to assess barriers to discharge/monitor patient progress toward  functional and medical goals  Care Tool:  Bathing    Body parts bathed by patient: Left arm, Chest, Abdomen, Front perineal area, Buttocks, Right upper leg, Left upper leg, Right lower leg, Left lower leg, Face   Body parts bathed by helper: Right arm Body parts n/a: Right arm   Bathing assist Assist Level: Minimal Assistance - Patient > 75%     Upper Body Dressing/Undressing Upper body dressing   What is the patient wearing?: Pull over shirt    Upper body assist Assist Level: Minimal Assistance - Patient > 75%    Lower Body Dressing/Undressing Lower body dressing      What is the patient wearing?: Pants, Underwear/pull up     Lower body assist Assist for lower body dressing: Minimal Assistance - Patient > 75%     Toileting Toileting    Toileting assist Assist for toileting: Moderate Assistance - Patient 50 - 74%     Transfers Chair/bed transfer  Transfers assist     Chair/bed transfer assist level: Minimal Assistance - Patient > 75%     Locomotion Ambulation   Ambulation assist  Assist level: Moderate Assistance - Patient 50 - 74% Assistive device: Walker-hemi Max distance: 28   Walk 10 feet activity   Assist     Assist level: Moderate Assistance - Patient - 50 - 74% Assistive device: Walker-hemi   Walk 50 feet activity   Assist Walk 50 feet with 2 turns activity did not occur: Safety/medical concerns (L hemiplegia and pt fatigue)  Assist level: Moderate Assistance - Patient - 50 - 74% Assistive device: Hand held assist    Walk 150 feet activity   Assist Walk 150 feet activity did not occur: Safety/medical concerns (L hemiplegia and pt fatigue)  Assist level: Minimal Assistance - Patient > 75% Assistive device: Walker-Eva    Walk 10 feet on uneven surface  activity   Assist Walk 10 feet on uneven surfaces activity did not occur: Safety/medical concerns (L hemiplegia and pt fatigue)         Wheelchair     Assist Is  the patient using a wheelchair?: Yes Type of Wheelchair: Manual    Wheelchair assist level: Supervision/Verbal cueing Max wheelchair distance: 50    Wheelchair 50 feet with 2 turns activity    Assist        Assist Level: Supervision/Verbal cueing   Wheelchair 150 feet activity     Assist      Assist Level: Minimal Assistance - Patient > 75%   Blood pressure 119/79, pulse 94, temperature 98 F (36.7 C), resp. rate 16, height 5\' 6"  (1.676 m), weight 72.3 kg, SpO2 100%.   Medical Problem List and Plan: 1. 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 Search for facility ongoing. May only be able to go to custodial care facility  D/c SNF-  -Continue CIR therapies including PT, OT  2. DVT/anticoagulation:  Pharmaceutical: Lovenox              -antiplatelet therapy: Aspirin  81 mg daily and Plavix  75 mg daily x 3 weeks then Plavix  alone 3. Pain Management: Tylenol  as needed  3/25- denies pain- con't regimen rpn   3-30: Ongoing left hip pain; Voltaren  gel and Tylenol .    4/3- having new burning pain in LUE- which is seen sometimes with stroke- will add Duloxetine  30 mg daily for nerve pain- monitor for side effects.  -if gets nausea, will change to Lyrica 4/4-6 no side effects so far-  4/8- will increase Duloxetine  to 60 mg daily  DC hydrocodone  due to side effects, trial tyl #3  as needed for pain that is not relieved by Tylenol . 4/28- advised pt to use voltaren  gel  on all sides of L hand including between fingers- use tylenol  #3 as needed 4/29- pt admits doesn't like taking/using meds all the time- explained I don't have a "fix"- to make it better- have to take meds to have them work 4/30- encouraged again to  use voltaren  4x/day for L hand 5/1- pt said using voltaren  gel and tylenol  #3 as needed- adding lidoderm  patches 2 patches at night for back pain 5/2- HA's- will restart SCHEDULED Topamax  75 mg at bedtime- not sure what occurred? Needs to get nightly 5/3-4 appears improved with topamax  5/5- Will cont' topamax - hand  still bothering her- not clear what type of pain- but tylenol  helps some.  5/6- will increase Duloxetine  to 120 mg daily to help with nerve pain and mood 5/8- pt reports pain so much better in last 24 hours- Duloxetine  kicking in? 5/9- hasn't asked for pain meds this Am including tylenol , so wants to not register the complaint" yet today.  5/11- Add Lyrica 50 mg BID for nerve pain in L hand 5/12- pt feels this is helping despite feeling a little tired--continue for now 4. Depression/Behavior/Sleep: Provide emotional support  4/21 mood improved--continue duloxetine              -antipsychotic agents: N/A 5. Neuropsych/cognition: This patient is capable of making decisions on her own behalf. 6. Skin/Wound Care: Routine skin checks 7. Fluids/Electrolytes/Nutrition:encouraging po.   5/12- I personally reviewed the patient's labs today.     -encouraging PO 8.  Dysphagia.  Dysphagia 3 with thins currently  4/18 intake ok 5/11- intake good, but takes too big of bites    9.  Hyperlipidemia.  Crestor  10.  Recent History of cocaine/tobacco use.  UDS positive for cocaine.  Provide counseling, this has been a chronic issue , seen in PCP notes 11.  Hypothyroidism. continue Synthroid   12.  Constipation.   Senokot-S 2 tab twice daily  5/6- LBM yesterday even though chart says 5/3  5/7- will add Miralax - esp since increased Duloxetine  which can cause constipation- already on Senna 2 tabs BID  5/9- LBM documented as yesterday- medium BM  5.10- LBM 2 days ago- thinks can go today  LBM 5/12 15. Blurry vision and dysconjugate gaze  3/27- will need Ophtho after d/c.   3-29: Seems to mostly be  from the left eye, with difficulty converging and tracking; messaged OT and PT to trial taping versus alternating eye patch to help with convergence.  3-30: Reporting improvement with use of taping  4/4- pt reports everything blurry- cannot see faces as a result- never had eye checked in past- used readers but cannot see at a distance since stroke- will send to Ophtho after d/c.   4/7- d/w pt again- she wants eyeglasses- explained we cannot get when in hospital- also per guidelines, since vision can still change, to not get for 3 months after stroke. She disbelieves it's from stroke, since had 2 prior- that didn't cause vision changes- we educated pt pn how this can occur.   4/25- reminded pt cannot get Optho in hospital- need to get once d/c'd  4/30 went over visual changes with pt- will need to get glasses, likely prisms 2-3 months after d/c once vision stabilizes  5/6- d/w pt again- she wants to see Optho- explained they do not come into hospital for this 16.  Left hip pain.  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 Severe HA-daily HA's  4/15-16 sx appear improved with topamax , tylenol  helps also   -may use sx to get out of therapy at times?  4/23- Per team, pt having HA's frequently which impairs therapy- she won't discuss HA's with me- unclear if only has a  day goes on vs self limting behavior  4/24- increase to 75 mg at bedtime  4/25- still having HA's, actually thinks a little worse since not taking Norco for it-   4/28-4/29 fewer complaints about HA this AM  4/30-5/1- - per staff, c/o these less- not interfering  with therapy as much  5/2- Wasn't getting topamax - just taking T#3- wil restart Topamax - don't know what occurred with order  5/6- HA's doing better  5/8- not requiring T#3 anymore lately per pt 18. Decreased safety awareness/impulsiveness  4/30- still requiring max cues  5/7- 5/10- no change-per therapy- still requiring max cues 20. Spasticity  4/22- off baclofen , and c/o LUE pain- wondering how much of her complaints are due to tone?-   4/30- Spasticity improved in LUE- MAS of 0 actually this AM  5/11-12- Getting some spasticity vs disuse tightness inL hand- fingers esp 3rd/4th DIPs- might need Botox after d/c 21. 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 .   4/30- pt pulled scab off-  22.  Urinary  and bowel incont due to CVA, cont toileting program 4/23- still cannot control B/B 4/30- on toileting program- is getting somewhat better, but still incontinent more at night   23. 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   4/29- will get Dopplers, because pt says she elevated hand overnight- still swollen- ordered  4/30- Dopplers (-)- will get isotoner compression glove  5/4 received glove  5/6- Swelling ~ 50% better- will increase Duloxetine  to 120 mg daily for nerve pain in L hand and mood.   5/7- hand sore this AM- but not "killing her".   5/8- pain great this AM- doesn't need meds other than tylenol  per pt -sounds like increased cymbalta  has helped  5/9- Some pain in L hand this Am, but hasn't taken tylenol  or T#3 yet this Am-  24. C/O catch in breath- no SOB  5/9- asked pt to use ICS every 1 hour while  awake- since probably is atelectasis  25. Dry eyes  5/10- ordered eye drops as needed for pt   LOS: 49 days A FACE TO FACE EVALUATION WAS PERFORMED  Rawland Caddy 03/29/2024, 10:25 AM

## 2024-03-29 NOTE — Plan of Care (Signed)
  Problem: Consults Goal: RH STROKE PATIENT EDUCATION Description: See Patient Education module for education specifics  Outcome: Progressing   Problem: RH BOWEL ELIMINATION Goal: RH STG MANAGE BOWEL WITH ASSISTANCE Description: STG Manage Bowel with toileting Assistance. Outcome: Progressing   Problem: RH BOWEL ELIMINATION Goal: RH STG MANAGE BOWEL W/MEDICATION W/ASSISTANCE Description: STG Manage Bowel with Medication with mod I Assistance. Outcome: Progressing   Problem: RH SAFETY Goal: RH STG ADHERE TO SAFETY PRECAUTIONS W/ASSISTANCE/DEVICE Description: STG Adhere to Safety Precautions With cues Assistance/Device. Outcome: Progressing   Problem: RH PAIN MANAGEMENT Goal: RH STG PAIN MANAGED AT OR BELOW PT'S PAIN GOAL Description: Pain managed < 4 with prns Outcome: Progressing   Problem: RH KNOWLEDGE DEFICIT Goal: RH STG INCREASE KNOWLEDGE OF STROKE PROPHYLAXIS Description: Patient and mother will be able to manage secondary risks using educational resources for medications and dietary modification independently Outcome: Progressing   Problem: RH KNOWLEDGE DEFICIT Goal: RH STG INCREASE KNOWLEGDE OF HYPERLIPIDEMIA Description: Patient and mother will be able to manage HLD using educational resources for medications and dietary modification independently Outcome: Progressing   Problem: RH BLADDER ELIMINATION Goal: RH STG MANAGE BLADDER WITH ASSISTANCE Description: STG Manage Bladder With Assistance Outcome: Progressing

## 2024-03-29 NOTE — Progress Notes (Signed)
 Occupational Therapy Session Note  Patient Details  Name: Theresa Watkins MRN: [id number] Date of Birth: [birth year]  Today's Date: 03/29/2024 OT Individual Time: 0815-0900 OT Individual Time Calculation (min): 45 min    Short Term Goals: Week 6:  OT Short Term Goal 1 (Week 6): Pt will perform UB dressing tasks with min A OT Short Term Goal 1 - Progress (Week 6): Met OT Short Term Goal 2 (Week 6): Pt will perfrom toileting tasks with min A OT Short Term Goal 2 - Progress (Week 6): Progressing toward goal  Skilled Therapeutic Interventions/Progress Updates:    OT skilled OT intervention with focus on self feeding, bed mobility, and LUE function. Min verbal cues for swallowing precautions during self feeding but pt able to manage utensils with RUE. Bed mobility with min A for repositioning. Pt declined changing clothing. LUE edema improving. Pt requested LUE/hand to be ace wrapped. Pt drowsy and somewhat lethargic during session. Pt states she started a new medication. Pt unable to continue 2/2 lethargy. Pt missed 30 mins skilled OT services. Will attempt to see again as schedule allows.   Therapy Documentation Precautions:  Precautions Precautions: Fall Precaution/Restrictions Comments: L hemi Restrictions Weight Bearing Restrictions Per Provider Order: No   Pain: Pt reports LUE/hand "still tender"; Ace wrap applied and repositining  Therapy/Group: Individual Therapy  [name] 03/29/2024, 9:21 AM

## 2024-03-29 NOTE — Progress Notes (Signed)
 Nurse called patient refusing Lyrica, reports she had blurry vision with Lyrica on yesterday. Nurse will continue to monitor

## 2024-03-29 NOTE — Progress Notes (Signed)
 Patient ID: RETTA SALIBA, female   DOB: 07-Oct-1966, 58 y.o.   MRN: [id number]  Liaison reports unable to accept pt due to medical diagnosis on admission related to substance use. SW will continue to explore other options.   Norval Been, MSW, LCSW Office: [phone] Cell: [phone] Fax: [phone]

## 2024-03-30 DIAGNOSIS — I63511 Cerebral infarction due to unspecified occlusion or stenosis of right middle cerebral artery: Secondary | ICD-10-CM | POA: Diagnosis not present

## 2024-03-30 MED ORDER — PREGABALIN 50 MG PO CAPS
50.0000 mg | ORAL_CAPSULE | Freq: Every day | ORAL | Status: DC
  Administered 2024-03-30 – 2024-04-27 (×29): 50 mg via ORAL
  Filled 2024-03-30 (×12): qty 1
  Filled 2024-03-30: qty 2
  Filled 2024-03-30 (×16): qty 1

## 2024-03-30 MED ORDER — COLCHICINE 0.6 MG PO TABS
0.6000 mg | ORAL_TABLET | Freq: Every day | ORAL | Status: AC
  Administered 2024-03-30 – 2024-04-02 (×4): 0.6 mg via ORAL
  Filled 2024-03-30 (×4): qty 1

## 2024-03-30 MED ORDER — PREDNISONE 20 MG PO TABS
20.0000 mg | ORAL_TABLET | Freq: Every day | ORAL | Status: AC
  Administered 2024-03-30 – 2024-04-02 (×4): 20 mg via ORAL
  Filled 2024-03-30 (×4): qty 1

## 2024-03-30 NOTE — Plan of Care (Signed)
  Problem: Consults Goal: RH STROKE PATIENT EDUCATION Description: See Patient Education module for education specifics  Outcome: Progressing   Problem: RH BOWEL ELIMINATION Goal: RH STG MANAGE BOWEL WITH ASSISTANCE Description: STG Manage Bowel with toileting Assistance. Outcome: Progressing   Problem: RH BOWEL ELIMINATION Goal: RH STG MANAGE BOWEL W/MEDICATION W/ASSISTANCE Description: STG Manage Bowel with Medication with mod I Assistance. Outcome: Progressing   Problem: RH SAFETY Goal: RH STG ADHERE TO SAFETY PRECAUTIONS W/ASSISTANCE/DEVICE Description: STG Adhere to Safety Precautions With cues Assistance/Device. Outcome: Progressing   Problem: RH PAIN MANAGEMENT Goal: RH STG PAIN MANAGED AT OR BELOW PT'S PAIN GOAL Description: Pain managed < 4 with prns Outcome: Progressing   Problem: RH BOWEL ELIMINATION Goal: RH STG MANAGE BOWEL WITH ASSISTANCE Description: STG Manage Bowel with Assistance. Outcome: Progressing   Problem: Consults Goal: Diabetes Guidelines if Diabetic/Glucose > 140 Description: If diabetic or lab glucose is > 140 mg/dl - Initiate Diabetes/Hyperglycemia Guidelines & Document Interventions  Outcome: Progressing   Problem: Consults Goal: Nutrition Consult-if indicated Outcome: Progressing   Problem: Consults Goal: Skin Care Protocol Initiated - if Braden Score 18 or less Description: If consults are not indicated, leave blank or document N/A Outcome: Progressing   Problem: Consults Goal: RH GENERAL PATIENT EDUCATION Description: See Patient Education module for education specifics. Outcome: Progressing

## 2024-03-30 NOTE — Patient Care Conference (Signed)
 Inpatient RehabilitationTeam Conference and Plan of Care Update Date: 03/30/2024   Time: 1102 am    Patient Name: Theresa Watkins      Medical Record Number: [id number]  Date of Birth: Sep 17, 1966 Sex: Female         Room/Bed: 4W01C/4W01C-01 Payor Info: Payor: [place] MEDICAID PREPAID HEALTH PLAN / Plan: [place] MEDICAID HEALTHY BLUE / Product Type: *No Product type* /    Admit Date/Time:  02/09/2024  6:29 PM  Primary Diagnosis:  Right middle cerebral artery stroke [facility])  Hospital Problems: Principal Problem:   Right middle cerebral artery stroke (HCC) Active Problems:   Polysubstance abuse (HCC)    Expected Discharge Date: Expected Discharge Date:  (pending)  Team Members Present: Physician leading conference: Dr. Celia Coles Nurse Present: Jerene Monks, RN PT Present: Aundria Leech, PT OT Present: [name], OT;Jackqueline Mason, COTA SLP Present: Jenney Modest, SLP     Current Status/Progress Goal Weekly Team Focus  Bowel/Bladder   Continent of bowel and bladder. LBM 5/12   Remain free from S/S of constipation.   Offer toileting q 3 hours during day and q4 at hs. Give medication per order.    Swallow/Nutrition/ Hydration   regular diet/ thin liquid benefits from supervision for safety awareness and use of compensatory strategies   min A  carryover of comepsnatory strategies, tolerance of regular diet    ADL's   bathing-min A; LB dressing-mod A; UB dressing-min A; transfers-min A; impulsivity improving slightly, min/mod verbal cues for hemi dressing   min A overall; LB dressing downgraded to mod A; dynamic standing balance downgraded to min A; anticipatory awareness downgraded to mod A   safety awareness, ADL retraining, LUE NMR    Mobility   S bed mobility, min-CGA transfers, gait with min-mod, limited by safety awareness and impulsivity   supervision bed<>chair, min a short distance gait, mod i w/c  gait, transfers, NMR, balance    Communication   mixed dysarthria  with fluctuating speech intelligibility based on fatigue and pain level   min A   carryover of compensatory strategies.    Safety/Cognition/ Behavioral Observations               Pain   C/O of headache and pain to L arm.   Pt states pain level <3 out of 10.   Assess skin q shift and PRN, give medication as ordered. Pt refused lyrica    Skin   Abrasion to Finer on L hand. OTA   REmain free from S/S of infection.  Assess skin q shift and pRN.      Discharge Planning:  Pt is SNF bed offer and insurance approval. Pt is no longer approved for CIR beginning on 03/16/24. Limited SNF options due to insurnace. APS referral was declined and referral made for placement SW. SW will confirm there are no barriers to discharge.    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 Patient limited with left hand swelling, nerve pain left lower extremity, headaches: medications adjusted by MD. Patient limited by  impulsitivity, spasticity, needing max cues with gait training, poor carry over , poor safety awareness and mixed dysarthria.   Patient on target to meet rehab goals: yes, Patient requires minimal assistance with ADLs and transfers. Patient requires min-mod assistance with ambulation using a hemiwalker.  Overall goals at discharge are set for mod-min assistance.   *See Care Plan and progress notes for long and short-term  goals.   Revisions to Treatment Plan:  Hemi dressing techniques  Blanca Bunch Hemi walker IOPI EMST device Full supervision with meals  Teaching Needs: Safety, medications, transfers, toileting, etc     Current Barriers to Discharge: Decreased caregiver support and Incontinence  Possible Resolutions to Barriers: Family Education SNF    Medical Summary Current Status: refusing some of her meds- because makes drowsy- L hand swelling nad pain due to stroke- pt wants to treat for gout   Barriers to Discharge: Behavior/Mood;Incontinence;Spasticity;Self-care education;Uncontrolled Pain  Barriers to Discharge Comments: limited by family/dispo-  safety awareness, L hand pain/swelling- Possible Resolutions to [employer] Focus: shirt Supervision x1; needs max cues- will try Coclchicine for 4 days and Prednisone  20 mg daily- for L hand pain; added Lyrica- 1x/day due to visual changes- improved participation- d/c SNF- will put back to full supervision so doesn't choke   Continued Need for Acute Rehabilitation Level of Care: The patient requires daily medical management by a physician with specialized training in physical medicine and rehabilitation for the following reasons: Direction of a multidisciplinary physical rehabilitation program to maximize functional independence : Yes Medical management of patient stability for increased activity during participation in an intensive rehabilitation regime.: Yes Analysis of laboratory values and/or radiology reports with any subsequent need for medication adjustment and/or medical intervention. : Yes   I attest that I was present, lead the team conference, and concur with the assessment and plan of the team.   Lorali Khamis Gayo 03/30/2024, 1102 am

## 2024-03-30 NOTE — Progress Notes (Signed)
 Speech Language Pathology Daily Session Note  Patient Details  Name: Theresa Watkins MRN: [id number] Date of Birth: [birth year]  Today's Date: 03/30/2024 SLP Individual Time: 0101-0134 SLP Individual Time Calculation (min): 33 min  Short Term Goals: Week 7: SLP Short Term Goal 1 (Week 7): Patient will utilize swallowing compensatory strategies to reduce s/sx of aspiration during consumption of PO given sup multimodal A SLP Short Term Goal 2 (Week 7): Patient will communicate complex information with 90% speech intelligibility with sup cues  Skilled Therapeutic Interventions:  Patient was seen in PM to address speech intelligibility and dysphagia management. Pt was alert upon SLP arrival and consuming lunch meal of regular solids and thin liquids. Pt administering all trials. Pt benefiting from min cues to refrain from talking during meal, modify bolus size, and to complete oral clearance. Pt with no s/s aspiration throughout. SLP recommending continuation of current diet with full supervision. In other minutes of session, SLP addressed speech intelligibility. Pt completed 5 sets of 5 via EMST for breath support set at 30cm H2O given sup to min A. Pt engaged in conversational exchange varying from 70-90% throughout session. Patient left upright in bed at conclusion of session with call button within reach and bed alarm active. SLP to continue POC.   Pain Pain Assessment Pain Scale: 0-10 Pain Score: 5  Pain Type: Acute pain  Therapy/Group: Individual Therapy  Adela Holter 03/30/2024, 3:48 PM

## 2024-03-30 NOTE — Group Note (Deleted)
 Patient Details Name: Theresa Watkins MRN: [id number] DOB: [birth year] Today's Date: 03/30/2024  Time Calculation:        Group Description: [employer]: Patient participated in [employer] with both OT and SLP with focus on self-feeding, dysphagia goals, cognitive goals, and appropriate social interaction within a group environment.    Individual level documentation: Patient participated in diner's club and required {assistance:28279} assist for functional use of {RIGHT/LEFT:20294} UE during self-feeding.   Patient participated in d[employer]'s club with focus on dysphagia goals. Patient consumed their lunch meal of {diet with liquids:28280}. Patient consumed meal {consistent/inconsistent:28281} overt s/s of aspiration and required {assistance:28279} assist for use of swallowing compensatory strategies. Recommend patient continue current diet.   Patient participated in diner's club with focus on cognitive goals. Patient required (drop down selection {assistance:28279} assist for selective attention in a mildly distracting environment. Patient also required {assistance:28279} assist for visual scanning to {RIGHT/LEFT:20294} field of environment. Patient also demonstrated appropriate social interaction within a group environment with overall {assistance:28279} assist.   Pain: Pain Assessment Pain Scale: 0-10 Pain Score: 5  Pain Location: Head Pain Intervention(s): Medication (See eMAR)  Precautions:     [name] [facility] 03/30/2024, 1:11 PM

## 2024-03-30 NOTE — Progress Notes (Signed)
 PROGRESS NOTE   Subjective/Complaints:  Pt reports Lyrica affected her vision- so wants to reduce to Qday- wants after therapy- will change to q6pm.   Pt also said gout runs in her family, even though never had before herself.  Asking if we can "treat for gout" thinking that could be why her L hand is swollen and hurts- I explained it's probably low yield, but can try it.     ROS:   Pt denies SOB, abd pain, CP, N/V/C/D, and vision changes  Objective:   No results found.     Recent Labs    03/29/24 0554  WBC 4.8  HGB 13.7  HCT 41.3  PLT 172         Recent Labs    03/29/24 0554  NA 141  K 3.8  CL 110  CO2 22  GLUCOSE 83  BUN 17  CREATININE 0.82  CALCIUM  8.1*          Intake/Output Summary (Last 24 hours) at 03/30/2024 0842 Last data filed at 03/29/2024 1940 Gross per 24 hour  Intake 120 ml  Output --  Net 120 ml            Physical Exam: Vital Signs Blood pressure (!) 134/94, pulse 80, temperature 98.1 F (36.7 C), temperature source Oral, resp. rate 16, height 5\' 6"  (1.676 m), weight 72.3 kg, SpO2 93%.   General: awake, alert, appropriate, sitting up eating- too big of cites on plate;  NAD HENT: conjugate gaze; oropharynx moist CV: regular rate and rhythm; no JVD Pulmonary: CTA B/L; no W/R/R- good air movement GI: soft, NT, ND, (+)BS Psychiatric: appropriate- less delayed speech/responses Neurological:  more oriented- still dysarthric but more clear as to what she wants ot do meds wise- reasonable MSK- left hand sl tender. Wrapped with ACE, ~1+ edema Neuro: cognitively at baseline. Alert. Sl dysarthria. CN without changes. MAS of 0-1 in LUE Motor strength 0/5 LUE 3-/5 LLE 5/5 on right side  Skin- L PIP of 2nd digit- healing scab     Assessment/Plan: 1. Functional deficits which require 3+ hours per day of interdisciplinary therapy in a comprehensive inpatient rehab  setting. Physiatrist is providing close team supervision and 24 hour management of active medical problems listed below. Physiatrist and rehab team continue to assess barriers to discharge/monitor patient progress toward functional and medical goals  Care Tool:  Bathing    Body parts bathed by patient: Left arm, Chest, Abdomen, Front perineal area, Buttocks, Right upper leg, Left upper leg, Right lower leg, Left lower leg, Face   Body parts bathed by helper: Right arm Body parts n/a: Right arm   Bathing assist Assist Level: Minimal Assistance - Patient > 75%     Upper Body Dressing/Undressing Upper body dressing   What is the patient wearing?: Pull over shirt    Upper body assist Assist Level: Minimal Assistance - Patient > 75%    Lower Body Dressing/Undressing Lower body dressing      What is the patient wearing?: Pants, Underwear/pull up     Lower body assist Assist for lower body dressing: Minimal Assistance - Patient > 75%     Toileting Toileting  Toileting assist Assist for toileting: Moderate Assistance - Patient 50 - 74%     Transfers Chair/bed transfer  Transfers assist     Chair/bed transfer assist level: Minimal Assistance - Patient > 75%     Locomotion Ambulation   Ambulation assist      Assist level: Moderate Assistance - Patient 50 - 74% Assistive device: Walker-hemi Max distance: 28   Walk 10 feet activity   Assist     Assist level: Moderate Assistance - Patient - 50 - 74% Assistive device: Walker-hemi   Walk 50 feet activity   Assist Walk 50 feet with 2 turns activity did not occur: Safety/medical concerns (L hemiplegia and pt fatigue)  Assist level: Moderate Assistance - Patient - 50 - 74% Assistive device: Hand held assist    Walk 150 feet activity   Assist Walk 150 feet activity did not occur: Safety/medical concerns (L hemiplegia and pt fatigue)  Assist level: Minimal Assistance - Patient > 75% Assistive device:  Walker-Eva    Walk 10 feet on uneven surface  activity   Assist Walk 10 feet on uneven surfaces activity did not occur: Safety/medical concerns (L hemiplegia and pt fatigue)         Wheelchair     Assist Is the patient using a wheelchair?: Yes Type of Wheelchair: Manual    Wheelchair assist level: Supervision/Verbal cueing Max wheelchair distance: 50    Wheelchair 50 feet with 2 turns activity    Assist        Assist Level: Supervision/Verbal cueing   Wheelchair 150 feet activity     Assist      Assist Level: Minimal Assistance - Patient > 75%   Blood pressure (!) 134/94, pulse 80, temperature 98.1 F (36.7 C), temperature source Oral, resp. rate 16, height 5\' 6"  (1.676 m), weight 72.3 kg, SpO2 93%.   Medical Problem List and Plan: 1. 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 Search for facility ongoing. May only be able to go to custodial care facility  D/c SNF-  Con't CIR PT, OT and SLP 2. DVT/anticoagulation:  Pharmaceutical: Lovenox              -antiplatelet therapy: Aspirin  81 mg daily and Plavix  75 mg daily x 3 weeks then Plavix  alone 3. Pain Management: Tylenol  as needed  3/25- denies pain- con't regimen rpn   3-30: Ongoing left hip pain; Voltaren  gel and Tylenol .    4/3- having new burning pain in LUE- which is seen sometimes with stroke- will add Duloxetine  30 mg daily for nerve pain- monitor for side effects.  -if gets nausea, will change to Lyrica 4/4-6 no side effects so far-  4/8- will increase Duloxetine  to 60 mg daily  DC hydrocodone  due to side effects, trial tyl #3  as needed for pain that is not relieved by Tylenol . 4/28- advised pt to use voltaren  gel  on all sides  of L hand including between fingers- use tylenol  #3 as needed 4/29- pt admits doesn't like taking/using meds all the time- explained I don't have a "fix"- to make it better- have to take meds to have them work 4/30- encouraged again to use  voltaren  4x/day for L hand 5/1- pt said using voltaren  gel and tylenol  #3 as needed- adding lidoderm  patches 2 patches at night for back pain 5/2- HA's- will restart SCHEDULED Topamax  75 mg at bedtime- not sure what occurred? Needs to get nightly 5/3-4 appears improved with topamax  5/5- Will cont' topamax - hand still bothering her- not clear what type of pain- but tylenol  helps some.  5/6- will increase Duloxetine  to 120 mg daily to help with nerve pain and mood 5/8- pt reports pain so much better in last 24 hours- Duloxetine  kicking in? 5/9- hasn't asked for pain meds this Am including tylenol , so wants to not register the complaint" yet today.  5/11- Add Lyrica 50 mg BID for nerve pain in L hand 5/13- changed to q6pm since pt feels it affected her vision-made it worse 4. Depression/Behavior/Sleep: Provide emotional support  4/21 mood improved--continue duloxetine              -antipsychotic agents: N/A 5. Neuropsych/cognition: This patient is capable of making decisions on her own behalf. 6. Skin/Wound Care: Routine skin checks 7. Fluids/Electrolytes/Nutrition:encouraging po.   5/12- I personally reviewed the patient's labs today.     -encouraging PO 8.  Dysphagia.  Dysphagia 3 with thins currently  4/18 intake ok 5/11-5/13- advised pt to take smaller bites-  intake good, but takes too big of bites    9.  Hyperlipidemia.  Crestor  10.  Recent History of cocaine/tobacco use.  UDS positive for cocaine.  Provide counseling, this has been a chronic issue , seen in PCP notes 11.  Hypothyroidism. continue Synthroid   12.  Constipation.   Senokot-S 2 tab twice daily  5/6- LBM yesterday even though chart says 5/3  5/7- will add Miralax - esp since increased  Duloxetine  which can cause constipation- already on Senna 2 tabs BID  5/9- LBM documented as yesterday- medium BM  5.10- LBM 2 days ago- thinks can go today  LBM 5/12 15. Blurry vision and dysconjugate gaze  3/27- will need Ophtho after d/c.   3-29: Seems to mostly be from the left eye, with difficulty converging and tracking; messaged OT and PT to trial taping versus alternating eye patch to help with convergence.  3-30: Reporting improvement with use of taping  4/4- pt reports everything blurry- cannot see faces as a result- never had eye checked in past- used readers but cannot see at a distance since stroke- will send to Ophtho after d/c.   4/7- d/w pt again- she wants eyeglasses- explained we cannot get when in hospital- also per guidelines, since vision can still change, to not get for 3 months after stroke. She disbelieves it's from stroke, since had 2 prior- that didn't cause vision changes- we educated pt pn how this can occur.   4/25- reminded pt cannot get Optho in hospital- need to get once d/c'd  4/30 went over visual changes with pt- will need to get glasses, likely prisms 2-3 months after d/c once vision stabilizes  5/6- d/w pt again- she wants to see Optho- explained they do not come into hospital for this 16.  Left hip pain.  -3-29 x-ray showing moderate degenerative joint disease of the hip; no fracture  -Continue Voltaren  gel to hip 4 times daily  -May benefit from steroid injection as an outpatient  3/31- started Vit D and Ca per pt request  - improved to an extent  4/24- Pt reports L hip still hurts when stands/works with therapy however don't want her on opiates  regularly due to can impair sensorium and she also ready has issues  17. Severe HA-daily HA's  4/15-16 sx appear improved with topamax , tylenol  helps also   -may use sx to get out of therapy at times?  4/23- Per team, pt having HA's frequently which impairs therapy- she won't discuss HA's with me- unclear if only  has a day goes on vs self limting behavior  4/24- increase to 75 mg at bedtime  4/25- still having HA's, actually thinks a little worse since not taking Norco for it-   4/28-4/29 fewer complaints about HA this AM  4/30-5/1- - per staff, c/o these less- not interfering with therapy as much  5/2- Wasn't getting topamax - just taking T#3- wil restart Topamax - don't know what occurred with order  5/6- HA's doing better  5/8- not requiring T#3 anymore lately per pt  5/13- Severe HA 5/11 in afternoon- took T#3 18. Decreased safety awareness/impulsiveness  4/30- still requiring max cues  5/7- 5/10- no change-per therapy- still requiring max cues 20. Spasticity  4/22- off baclofen , and c/o LUE pain- wondering how much of her complaints are due to tone?-   4/30- Spasticity improved in LUE- MAS of 0 actually this AM  5/11-12- Getting some spasticity vs disuse tightness inL hand- fingers esp 3rd/4th DIPs- might need Botox after d/c 21. 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 .   4/30- pt pulled scab off-  22.  Urinary  and bowel incont due to CVA, cont toileting program 4/23- still cannot control B/B 4/30- on toileting program- is getting somewhat better, but still incontinent more at night  5/13- still incontinent at night  23. 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   4/29- will get Dopplers, because pt says she elevated hand overnight- still swollen- ordered  4/30- Dopplers (-)- will get isotoner compression glove  5/4 received glove  5/6- Swelling ~ 50% better- will increase Duloxetine  to 120 mg daily for nerve  pain in L hand and mood.   5/7- hand sore this AM- but not "killing her".   5/8- pain great this AM- doesn't need meds other than tylenol  per pt -sounds like increased cymbalta  has helped  5/9- Some pain in L hand this Am, but hasn't taken tylenol  or T#3 yet this Am- 5/13- Will decrease Lyrica to 50 mg q6pm since caused her to have visual changes early in day  24. C/O catch in breath- no SOB  5/9- asked pt to use ICS every 1 hour while awake- since probably is atelectasis  25. Dry eyes  5/10- ordered eye drops as needed for pt 26. Gout? Swelling of L hand  5/13- pt has never had gout but runs in her family- she wants to try something else ot see if would help L hand- will try Colchicine 0.6 mg daily x 4 days as well as Prednisone  20 mg daily x 4 days-    I spent a total of  45  minutes on total care today- >50% coordination of care- due to  D/w pt at length about gout, L  hand swelling- I think it's a low yeld since I think swelling form stroke, but we can try it- it's worth a try- also d/w therapy- and team conference to f/u on progress LOS: 50 days A FACE TO FACE EVALUATION WAS PERFORMED  Shreyan Hinz 03/30/2024, 8:42 AM

## 2024-03-30 NOTE — Progress Notes (Signed)
 PT refused lyrica, stated that it did make her hand less painful but that her eyes felt blurred. Denies vision changes at present time. Notified oncall of refusal. Pt also stating that she does not want it in the morning either.

## 2024-03-30 NOTE — Progress Notes (Signed)
 Physical Therapy Session Note  Patient Details  Name: Theresa Watkins MRN: [id number] Date of Birth: 1966/06/23  Today's Date: 03/30/2024 PT Individual Time: 0920-0945 PT Individual Time Calculation (min): 25 min   Short Term Goals: Week 5:  PT Short Term Goal 1 (Week 5): =LTGs d/t ELOS PT Short Term Goal 1 - Progress (Week 5): Met  Skilled Therapeutic Interventions/Progress Updates:    pt received in bed and reporting headache limiting her ability to participate in OOB therapy. Therapist assisted with donning compression glove, pt able to direct care and participate in pulling down glove. Pt agreeable to sitting EOB for stretching. Therapist assessed LE tone, finding muscle tightness but not spasticity at this time. Pt reports she stretches independently in the evening. Went over what she is doing and recommended adding hamstring/calf stretch with foot in strap to maintain ankle ROM. Also discussed technique and avoiding ballistic stretching in favor of static or dynamic stretching for safety. Pt returned to bed after session and was left with all needs in reach and alarm active.   Therapy Documentation Precautions:  Precautions Precautions: Fall Precaution/Restrictions Comments: L hemi Restrictions Weight Bearing Restrictions Per Provider Order: No General:       Therapy/Group: Individual Therapy  Tex Filbert 03/30/2024, 12:56 PM

## 2024-03-30 NOTE — Group Note (Signed)
 Patient Details Name: CASMIRA MAMOLA MRN: [id number] DOB: January 29, 1966 Today's Date: 03/30/2024  Time Calculation:  Missed 45 min of therapy due to declined to come to diner's club due to feel "woos ey" from meds- Tried twice to encourage eating out of the bed.        [name] [facility] 03/30/2024, 1:16 PM

## 2024-03-30 NOTE — Progress Notes (Signed)
 Occupational Therapy Session Note  Patient Details  Name: Theresa Watkins MRN: [id number] Date of Birth: [birth year]  Today's Date: 03/30/2024 OT Individual Time: 1015-1055 OT Individual Time Calculation (min): 40 min    Short Term Goals: Week 7:  OT Short Term Goal 1 (Week 7): Pt will perfrom toileting tasks with min A OT Short Term Goal 2 (Week 7): Pt will use LUE as gross assist during functional tasks with mod A  Skilled Therapeutic Interventions/Progress Updates:    Pt resting in bed upon arrival. Pt reports she "feels drowsy" but agreeable to UB dressing, bed mobility, and LUE functional reaching tasks to increase indepdnence with BADLs. Pt donned pull over shirt with supervision seated upright in bed with min verbal cues for technique. Min A for repositioning in bed using BLE to push up in bed. LUE AROM/AAROM to increase functional use. Ace wrapped applied to Lt hand (pt request). Pt remained in bed with all needs wihtin reach. Bed alarm activated.   Therapy Documentation Precautions:  Precautions Precautions: Fall Precaution/Restrictions Comments: L hemi Restrictions Weight Bearing Restrictions Per Provider Order: No Pain:  Pt reports LUE/hand tenderness   Therapy/Group: Individual Therapy  [name] 03/30/2024, 3:05 PM

## 2024-03-31 DIAGNOSIS — I63511 Cerebral infarction due to unspecified occlusion or stenosis of right middle cerebral artery: Secondary | ICD-10-CM | POA: Diagnosis not present

## 2024-03-31 NOTE — Progress Notes (Signed)
 Occupational Therapy Session Note  Patient Details  Name: Theresa Watkins MRN: [id number] Date of Birth: 1966-04-12  Today's Date: 03/31/2024 OT Individual Time: 4098-1191 OT Individual Time Calculation (min): 45 min  and Today's Date: 03/31/2024 OT Missed Time: 30 Minutes Missed Time Reason: Patient fatigue;Other (comment) (nausea)   Short Term Goals: Week 7:  OT Short Term Goal 1 (Week 7): Pt will perfrom toileting tasks with min A OT Short Term Goal 2 (Week 7): Pt will use LUE as gross assist during functional tasks with mod A  Skilled Therapeutic Interventions/Progress Updates:    Pt resting in bed upon arrival. Pt reports she just received her "pills" and is feeling nauseous and doesn't want to take shower this morning. Pt agreeable to bed level activities for improved LUE function/strength. Repositoning in bed with min A. Pt uses BLE to push up towards HOB. Ace wrap applied to LUE/hand. Edema improving and pt reports LUE/hand "feels better" today. Focus on AROM/AAROM/SROM to improve strength and functional use in BADLs. Pt reports she is feeling drowsy and needs to take a nap. Pt remained in bed with all needs within reach. Bed alarm activated. Pt missed 30 mins skilled OT services. Will attempt to see again as schedule allows.   Therapy Documentation Precautions:  Precautions Precautions: Fall Precaution/Restrictions Comments: L hemi Restrictions Weight Bearing Restrictions Per Provider Order: No General: General OT Amount of Missed Time: 30 Minutes   Pain: Pain Assessment Pain Score: 0-No pain   Therapy/Group: Individual Therapy  [name] 03/31/2024, 10:29 AM

## 2024-03-31 NOTE — [place] FL2 (Signed)
 [place]  MEDICAID FL2 LEVEL OF CARE FORM     IDENTIFICATION  Patient Name: Theresa Watkins Birthdate: 1965/11/21 Sex: female Admission Date (Current Location): 02/09/2024  [place] Number:  [name] [id number] Facility and Address:  [facility], 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   TIA (transient ischemic attack) 02/03/2024   Hypothyroidism 02/03/2024   Prediabetes 12/12/2022   Acute CVA (cerebrovascular accident) (HCC) 11/10/2022   Acute ischemic stroke (HCC) 11/09/2022   Chondromalacia patellae, 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   Unilateral primary osteoarthritis, left hip 09/25/2021   Left foot pain 08/26/2019   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   Tobacco dependence 04/09/2019   Vitamin D  deficiency 02/18/2018   Toe deformity, acquired, right 02/18/2018   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   Hyperlipemia 03/21/2017   Headache 03/21/2017   Tooth pain    Cerebral infarction (HCC) 12/27/2015   HLD (hyperlipidemia)    Hemiparesis affecting dominant side as late effect of stroke (HCC)    Tobacco abuse    Cocaine use disorder (HCC)    ETOH abuse    Postoperative hypothyroidism 12/24/2015   Drug abuse, cocaine type (HCC) 05/09/2014   Hx of thyroid  cancer 01/25/2014   Essential hypertension 06/03/2012    Orientation RESPIRATION BLADDER Height & Weight     Self, Time, Situation, Place  Normal Continent  Weight: 159 lb 6.3 oz (72.3 kg) Height:  5\' 6"  (167.6 cm)  BEHAVIORAL SYMPTOMS/MOOD NEUROLOGICAL BOWEL NUTRITION STATUS      Continent Diet  AMBULATORY STATUS COMMUNICATION OF NEEDS Skin   Limited Assist Verbally (pt speech impacted by stroke) Normal                       Personal Care Assistance Level of Assistance  Bathing, Feeding, Dressing Bathing Assistance: Limited assistance Feeding assistance: Limited assistance Dressing Assistance: Limited assistance     Functional Limitations Info  Sight, Hearing, Speech Sight Info: Adequate Hearing Info: Adequate Speech Info: Impaired    SPECIAL CARE FACTORS FREQUENCY  PT (By licensed PT), OT (By licensed OT), Speech therapy     PT Frequency: 5xs per week OT Frequency: 5xs per week     Speech Therapy Frequency: 5xs per week      Contractures Contractures Info: Not present    Additional Factors Info  Code Status, Allergies Code Status Info: Full Allergies Info: See discharge instructions           Current Medications (03/31/2024):  This is the current hospital active medication list Current Facility-Administered Medications  Medication Dose Route Frequency Provider Last Rate Last Admin   acetaminophen  (TYLENOL ) tablet 650 mg  650 mg Oral Q4H PRN Angiulli, Daniel J, PA-C   650 mg at 03/27/24 1001   acetaminophen -codeine  (TYLENOL  #3) 300-30 MG per tablet 1 tablet  1 tablet Oral Q8H PRN Genetta Kenning, MD   1 tablet at 03/31/24 2015   bisacodyl  (DULCOLAX) suppository 10  mg  10 mg Rectal Daily PRN Love, Pamela S, PA-C   10 mg at 02/09/24 2249   calcium  citrate (CALCITRATE - dosed in mg elemental calcium ) tablet 950 mg  200 mg of elemental calcium  Oral Daily Lylia Sand, MD   950 mg at 03/31/24 0920   cholecalciferol  (VITAMIN D3) 25 MCG (1000 UNIT) tablet 1,000 Units  1,000 Units Oral Daily Lovorn, Jacqlyn Matas, MD   1,000 Units at 03/31/24 1610   clopidogrel  (PLAVIX ) tablet 75 mg  75 mg Oral Daily AngiulliEverlyn Hockey,  PA-C   75 mg at 03/31/24 0920   colchicine tablet 0.6 mg  0.6 mg Oral Daily Lovorn, Megan, MD   0.6 mg at 03/31/24 9604   diclofenac  Sodium (VOLTAREN ) 1 % topical gel 2 g  2 g Topical QID Lovorn, Megan, MD   2 g at 03/30/24 1934   DULoxetine  (CYMBALTA ) DR capsule 120 mg  120 mg Oral Daily Lovorn, Megan, MD   120 mg at 03/31/24 0919   enoxaparin  (LOVENOX ) injection 40 mg  40 mg Subcutaneous Q24H Angiulli, Daniel J, PA-C   40 mg at 03/31/24 0919   levothyroxine  (SYNTHROID ) tablet 100 mcg  100 mcg Oral Q0600 Angiulli, Daniel J, PA-C   100 mcg at 03/31/24 0658   lidocaine  (LIDODERM ) 5 % 2 patch  2 patch Transdermal Q24H Lovorn, Megan, MD   2 patch at 03/30/24 1937   loratadine  (CLARITIN ) tablet 10 mg  10 mg Oral Daily [address], [place], PA-C   10 mg at 03/31/24 2016   polyethylene glycol (MIRALAX  / GLYCOLAX ) packet 17 g  17 g Oral Daily Lovorn, Megan, MD   17 g at 03/31/24 0921   polyvinyl alcohol (LIQUIFILM TEARS) 1.4 % ophthalmic solution 2 drop  2 drop Left Eye PRN Lovorn, Megan, MD       predniSONE  (DELTASONE ) tablet 20 mg  20 mg Oral Q breakfast Lovorn, Megan, MD   20 mg at 03/31/24 0920   pregabalin (LYRICA) capsule 50 mg  50 mg Oral Daily Lovorn, Megan, MD   50 mg at 03/31/24 1742   rosuvastatin  (CRESTOR ) tablet 40 mg  40 mg Oral Daily Angiulli, Daniel J, PA-C   40 mg at 03/31/24 0920   senna-docusate (Senokot-S) tablet 2 tablet  2 tablet Oral BID Lovorn, Megan, MD   2 tablet at 03/31/24 0919   sodium chloride  (OCEAN) 0.65 % nasal spray 1 spray  1 spray Each Nare PRN Street, [place], PA-C       sodium phosphate (FLEET) enema 1 enema  1 enema Rectal Daily PRN Love, Pamela S, PA-C       topiramate  (TOPAMAX ) tablet 75 mg  75 mg Oral QHS Lovorn, Megan, MD   75 mg at 03/23/24 2024     Discharge Medications: Please see discharge summary for a list of discharge medications.  Relevant Imaging Results:  Relevant Lab Results:   Additional Information VW#[id number]  Rennis Case,  LCSW

## 2024-03-31 NOTE — Plan of Care (Signed)
  Problem: Consults Goal: RH STROKE PATIENT EDUCATION Description: See Patient Education module for education specifics  Outcome: Progressing   Problem: RH BOWEL ELIMINATION Goal: RH STG MANAGE BOWEL WITH ASSISTANCE Description: STG Manage Bowel with toileting Assistance. Outcome: Progressing   Problem: RH BOWEL ELIMINATION Goal: RH STG MANAGE BOWEL W/MEDICATION W/ASSISTANCE Description: STG Manage Bowel with Medication with mod I Assistance. Outcome: Progressing   Problem: RH SAFETY Goal: RH STG ADHERE TO SAFETY PRECAUTIONS W/ASSISTANCE/DEVICE Description: STG Adhere to Safety Precautions With cues Assistance/Device. Outcome: Progressing   Problem: RH PAIN MANAGEMENT Goal: RH STG PAIN MANAGED AT OR BELOW PT'S PAIN GOAL Description: Pain managed < 4 with prns Outcome: Progressing   Problem: RH BOWEL ELIMINATION Goal: RH STG MANAGE BOWEL WITH ASSISTANCE Description: STG Manage Bowel with Assistance. Outcome: Progressing

## 2024-03-31 NOTE — Progress Notes (Signed)
 Physical Therapy Session Note  Patient Details  Name: Theresa Watkins MRN: [id number] Date of Birth: [birth year]  Today's Date: 03/31/2024 PT Individual Time: 1135-1200 PT Individual Time Calculation (min): 25 min   Short Term Goals: Week 7:  PT Short Term Goal 1 (Week 7): pt will initiate stair navigation PT Short Term Goal 2 (Week 7): pt will initiate fall recovery training   Skilled Therapeutic Interventions/Progress Updates:      Pt presents in bed sleeping. Awakens to voice but reports feeling "groggy" after recent medication. Nursing made aware. Pt requesting to stay in bed due to grogginess. Pt assisted to [facility] with bed positioned flat with cues for bridges and pushing herself to [facility]. Able to complete with supervision. Rolling in bed completed at supervision level using bed rails to place chuck pad underneath her. She then completed bed level there-ex for BLE strengthening: -2x12 knee-to chest -2x12 hip abd/add  Pt having urinary incontinence while completing strengthening at bed level, pt aware. TotalA for brief change and linen change due to saturation through brief. Patient positioned in chair position via bed features. LUE supported with pillows. Call bell in lap.    Therapy Documentation Precautions:  Precautions Precautions: Fall Precaution/Restrictions Comments: L hemi Restrictions Weight Bearing Restrictions Per Provider Order: No General:       Therapy/Group: Individual Therapy  Pheobe Brass 03/31/2024, 7:52 AM

## 2024-03-31 NOTE — Progress Notes (Signed)
 PROGRESS NOTE   Subjective/Complaints: Pt feels like her L hand was much better yesterday evening and overnight- bothering her again some this AM, but better.   Didn't hurt her at all yesterday afternoon.  Also feels like swelling is better.  Upset that back to full supervision for food- since food "gets cold".  LBM 2 days ago-   ROS:    Pt denies SOB, abd pain, CP, N/V/C/D, and vision changes  L hand pain/swelling  Objective:   No results found.     Recent Labs    03/29/24 0554  WBC 4.8  HGB 13.7  HCT 41.3  PLT 172         Recent Labs    03/29/24 0554  NA 141  K 3.8  CL 110  CO2 22  GLUCOSE 83  BUN 17  CREATININE 0.82  CALCIUM  8.1*          Intake/Output Summary (Last 24 hours) at 03/31/2024 0833 Last data filed at 03/30/2024 1934 Gross per 24 hour  Intake 640 ml  Output --  Net 640 ml            Physical Exam: Vital Signs Blood pressure (!) 134/92, pulse 97, temperature 97.9 F (36.6 C), resp. rate 19, height 5\' 6"  (1.676 m), weight 72.3 kg, SpO2 98%.    General: awake, alert, appropriate, pulled up in bed by NT; NAD HENT: conjugate gaze; oropharynx moist CV: regular rate and rhythm- rate in 90's- ; no JVD Pulmonary: CTA B/L; no W/R/R- good air movement GI: soft, NT, ND, (+)BS- normoactive Psychiatric: appropriate- excited about pain reduction in L hand Neurological: still dysarthric MAS of 1- 1+ in L wrist and hand/fingers MSK- left hand sl tender. Wrapped with ACE, ~1+ edema Neuro: cognitively at baseline. Alert. Sl dysarthria. CN without changes. MAS of 0-1 in LUE Motor strength 0/5 LUE 3-/5 LLE 5/5 on right side  Skin- L PIP of 2nd digit- healing scab     Assessment/Plan: 1. Functional deficits which require 3+ hours per day of interdisciplinary therapy in a comprehensive inpatient rehab setting. Physiatrist is providing close team supervision and 24 hour  management of active medical problems listed below. Physiatrist and rehab team continue to assess barriers to discharge/monitor patient progress toward functional and medical goals  Care Tool:  Bathing    Body parts bathed by patient: Left arm, Chest, Abdomen, Front perineal area, Buttocks, Right upper leg, Left upper leg, Right lower leg, Left lower leg, Face   Body parts bathed by helper: Right arm Body parts n/a: Right arm   Bathing assist Assist Level: Minimal Assistance - Patient > 75%     Upper Body Dressing/Undressing Upper body dressing   What is the patient wearing?: Pull over shirt    Upper body assist Assist Level: Supervision/Verbal cueing    Lower Body Dressing/Undressing Lower body dressing      What is the patient wearing?: Pants, Underwear/pull up     Lower body assist Assist for lower body dressing: Minimal Assistance - Patient > 75%     Toileting Toileting    Toileting assist Assist for toileting: Moderate Assistance - Patient 50 - 74%  Transfers Chair/bed transfer  Transfers assist     Chair/bed transfer assist level: Minimal Assistance - Patient > 75%     Locomotion Ambulation   Ambulation assist      Assist level: Moderate Assistance - Patient 50 - 74% Assistive device: Walker-hemi Max distance: 28   Walk 10 feet activity   Assist     Assist level: Moderate Assistance - Patient - 50 - 74% Assistive device: Walker-hemi   Walk 50 feet activity   Assist Walk 50 feet with 2 turns activity did not occur: Safety/medical concerns (L hemiplegia and pt fatigue)  Assist level: Moderate Assistance - Patient - 50 - 74% Assistive device: Hand held assist    Walk 150 feet activity   Assist Walk 150 feet activity did not occur: Safety/medical concerns (L hemiplegia and pt fatigue)  Assist level: Minimal Assistance - Patient > 75% Assistive device: Walker-Eva    Walk 10 feet on uneven surface  activity   Assist Walk 10  feet on uneven surfaces activity did not occur: Safety/medical concerns (L hemiplegia and pt fatigue)         Wheelchair     Assist Is the patient using a wheelchair?: Yes Type of Wheelchair: Manual    Wheelchair assist level: Supervision/Verbal cueing Max wheelchair distance: 50    Wheelchair 50 feet with 2 turns activity    Assist        Assist Level: Supervision/Verbal cueing   Wheelchair 150 feet activity     Assist      Assist Level: Minimal Assistance - Patient > 75%   Blood pressure (!) 134/92, pulse 97, temperature 97.9 F (36.6 C), resp. rate 19, height 5\' 6"  (1.676 m), weight 72.3 kg, SpO2 98%.   Medical Problem List and Plan: 1. 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 Search for facility ongoing. May only be able to go to custodial care facility  D/c SNF-  Con't CIR PT < OT and SLP  2. DVT/anticoagulation:  Pharmaceutical: Lovenox              -antiplatelet therapy: Aspirin  81 mg daily and Plavix  75 mg daily x 3 weeks then Plavix  alone 3. Pain Management: Tylenol  as needed  3/25- denies pain- con't regimen rpn   3-30: Ongoing left hip pain; Voltaren  gel and Tylenol .    4/3- having new burning pain in LUE- which is seen sometimes with stroke- will add Duloxetine  30 mg daily for nerve pain- monitor for side effects.  -if gets nausea, will change to Lyrica 4/4-6 no side effects so far-  4/8- will increase Duloxetine  to 60 mg daily  DC hydrocodone  due to side effects, trial tyl #3  as needed for pain that is not relieved by Tylenol . 4/28- advised pt to use voltaren  gel  on all sides of L hand including between fingers- use tylenol  #3 as needed 4/29- pt admits doesn't like  taking/using meds all the time- explained I don't have a "fix"- to make it better- have to take meds to have them work 4/30- encouraged again to use voltaren  4x/day for L hand 5/1- pt said using voltaren  gel and tylenol  #3 as needed- adding  lidoderm  patches 2 patches at night for back pain 5/2- HA's- will restart SCHEDULED Topamax  75 mg at bedtime- not sure what occurred? Needs to get nightly 5/3-4 appears improved with topamax  5/5- Will cont' topamax - hand still bothering her- not clear what type of pain- but tylenol  helps some.  5/6- will increase Duloxetine  to 120 mg daily to help with nerve pain and mood 5/8- pt reports pain so much better in last 24 hours- Duloxetine  kicking in? 5/9- hasn't asked for pain meds this Am including tylenol , so wants to not register the complaint" yet today.  5/11- Add Lyrica 50 mg BID for nerve pain in L hand 5/13- changed to q6pm since pt feels it affected her vision-made it worse 5/14- said pain better today- not clear if due to prednisone  vs Lyrica? 4. Depression/Behavior/Sleep: Provide emotional support  4/21 mood improved--continue duloxetine              -antipsychotic agents: N/A 5. Neuropsych/cognition: This patient is capable of making decisions on her own behalf. 6. Skin/Wound Care: Routine skin checks 7. Fluids/Electrolytes/Nutrition:encouraging po.   5/12- I personally reviewed the patient's labs today.     -encouraging PO 8.  Dysphagia.  Dysphagia 3 with thins currently  4/18 intake ok 5/11-5/13- advised pt to take smaller bites-  intake good, but takes too big of bites    5/14- switched back to full supervision- pt unhappy, but wasn't listening when I discussed more of how to eat/take bites 9.  Hyperlipidemia.  Crestor  10.  Recent History of cocaine/tobacco use.  UDS positive for cocaine.  Provide counseling, this has been a chronic issue , seen in PCP notes 11.  Hypothyroidism. continue Synthroid   12.  Constipation.   Senokot-S 2 tab twice  daily  5/6- LBM yesterday even though chart says 5/3  5/7- will add Miralax - esp since increased Duloxetine  which can cause constipation- already on Senna 2 tabs BID  5/9- LBM documented as yesterday- medium BM  5.10- LBM 2 days ago- thinks can go today  LBM 5/12 15. Blurry vision and dysconjugate gaze  3/27- will need Ophtho after d/c.   3-29: Seems to mostly be from the left eye, with difficulty converging and tracking; messaged OT and PT to trial taping versus alternating eye patch to help with convergence.  3-30: Reporting improvement with use of taping  4/4- pt reports everything blurry- cannot see faces as a result- never had eye checked in past- used readers but cannot see at a distance since stroke- will send to Ophtho after d/c.   4/7- d/w pt again- she wants eyeglasses- explained we cannot get when in hospital- also per guidelines, since vision can still change, to not get for 3 months after stroke. She disbelieves it's from stroke, since had 2 prior- that didn't cause vision changes- we educated pt pn how this can occur.   4/25- reminded pt cannot get Optho in hospital- need to get once d/c'd  4/30 went over visual changes with pt- will need to get glasses, likely prisms 2-3 months after d/c once vision stabilizes  5/6- d/w pt again- she wants to see Optho- explained they do not come into hospital for this 16.  Left hip pain.  -3-29 x-ray showing moderate degenerative joint disease of the hip; no fracture  -Continue Voltaren  gel to hip 4 times daily  -May benefit from steroid injection as an outpatient  3/31- started Vit D and Ca per pt request  - improved to an extent  4/24-  Pt reports L hip still hurts when stands/works with therapy however don't want her on opiates regularly due to can impair sensorium and she also ready has issues  17. Severe HA-daily HA's  4/15-16 sx appear improved with topamax , tylenol  helps also   -may use sx to get out of therapy at times?  4/23- Per  team, pt having HA's frequently which impairs therapy- she won't discuss HA's with me- unclear if only has a day goes on vs self limting behavior  4/24- increase to 75 mg at bedtime  4/25- still having HA's, actually thinks a little worse since not taking Norco for it-   4/28-4/29 fewer complaints about HA this AM  4/30-5/1- - per staff, c/o these less- not interfering with therapy as much  5/2- Wasn't getting topamax - just taking T#3- wil restart Topamax - don't know what occurred with order  5/6- HA's doing better  5/8- not requiring T#3 anymore lately per pt  5/13- Severe HA 5/11 in afternoon- took T#3 18. Decreased safety awareness/impulsiveness  4/30- still requiring max cues  5/7- 5/10- no change-per therapy- still requiring max cues 20. Spasticity  4/22- off baclofen , and c/o LUE pain- wondering how much of her complaints are due to tone?-   4/30- Spasticity improved in LUE- MAS of 0 actually this AM  5/11-12- Getting some spasticity vs disuse tightness inL hand- fingers esp 3rd/4th DIPs- might need Botox after d/c  5/14- MAS of 1 to 1+ in L wrist and fingers- not proximally 21. 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 .   4/30- pt pulled scab off-  22.  Urinary  and bowel incont due to CVA, cont toileting program 4/23- still cannot control B/B 4/30- on toileting program- is getting somewhat better, but still incontinent more at night  5/13- still incontinent at night  23. 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   4/29- will get Dopplers, because pt says she elevated hand overnight- still swollen-  ordered  4/30- Dopplers (-)- will get isotoner compression glove  5/4 received glove  5/6- Swelling ~ 50% better- will increase Duloxetine  to 120 mg daily for nerve pain in L hand and mood.   5/7- hand sore this AM- but not "killing her".   5/8- pain great this AM- doesn't need meds other than tylenol  per pt -sounds like increased cymbalta  has helped  5/9- Some pain in L hand this Am, but hasn't taken tylenol  or T#3 yet this Am- 5/13- Will decrease Lyrica to 50 mg q6pm since caused her to have visual changes early in day  5/14- Pain doing better in last 12+ hours- not as painful 24. C/O catch in breath- no SOB  5/9- asked pt to use ICS every 1 hour while awake- since probably is atelectasis  25. Dry eyes  5/10- ordered eye drops as needed for pt 26. Gout? Swelling of L hand  5/13- pt has never had gout but runs in her family- she wants to try something else ot see if would help L hand- will try Colchicine 0.6 mg daily x 4  days as well as Prednisone  20 mg daily x 4 days-  5/14- pt feels pain and swelling are better- not sure if due to gout, or just prednisone  vs power of suggestion- will con't to monitor  I spent a total of  35  minutes on total care today- >50% coordination of care- due to  D/w pt about her L hand, pain and review of vitals- as well as new notes   LOS: 51 days A FACE TO FACE EVALUATION WAS PERFORMED  Sion Thane 03/31/2024, 8:33 AM

## 2024-03-31 NOTE — Progress Notes (Signed)
 Speech Language Pathology Weekly Progress and Session Note  Patient Details  Name: Theresa Watkins MRN: [id number] Date of Birth: 04-02-1966  Beginning of progress report period: Mar 24, 2024 End of progress report period: Mar 31, 2024  Today's Date: 03/31/2024 SLP Individual Time: 3086-5784 SLP Individual Time Calculation (min): 44 min  Short Term Goals: Week 7: SLP Short Term Goal 1 (Week 7): not met SLP Short Term Goal 2 (Week 7): not met    New Short Term Goals: Week 8: SLP Short Term Goal 1 (Week 8): Patient will communicate complex information with 90% intelligibility and sup A SLP Short Term Goal 2 (Week 8): Patient will utilize swallowing compensatory strategies twith sup A in order to decrease risk of aspiration of regular solids and thin liquids/  Weekly Progress Updates: Patient made steady gains this reporting period. Patient was upgraded to regular solids however continued to benefit from full supervision due to inconsistent use of compenstaory strategies and remaining high risk of aspiration. Speech intelligibility fluctuates between 70-90% to familiar and unfamiliar listeners with pt able to idenitfy some variables that contribute to fluctuating  speech intelligibility. Pt/ family education ongoing. Pt would benefit from continued skilled SLP intervention to maximize swallowing and speech in order to maximize her functional independence prior to discharge.  Intensity: Minumum of 1-2 x/day, 30 to 90 minutes Frequency: 1 to 3 out of 7 days Duration/Length of Stay: TBD Treatment/Interventions: Multimodal communication approach;Speech/Language facilitation;Functional tasks;Therapeutic Activities;Therapeutic Exercise;Internal/external aids;Dysphagia/aspiration precaution training;Patient/family education  Daily Session  Skilled Therapeutic Interventions: Patient was seen in am to address dysphagia managment and speech intelligibility. Pt was alert and seated upright in bed  upon SLP arrival consuming breakfast meal of regular solids and thin liquids. Pt inquiring on why supervision was changed from intermittent to full. SLP educated pt on rationale for change to full supervision including inconsistent use of swallowing compensatory strategies and occasional s/s aspiration. Pt continued to report no need for supervision however verbalized understanding for SLP recommendations. In other minutes of session SLP addressed speech intelligibility and swallowing through guiding pt in completion of exercises. PT utilized EMST set at 30cm H2O completing 5 sets of 5 with supervision cues. She completed lingual strengnthening via IOPI with 10 reps posteriorly completed at 23kPA and 10 reps anteriorly completed at 28kPa. Pt indep recalled 3 of 4 speech intelligibility strategies with SLP reviewing remaining strategies. She At conclusion of session pt was left upright in bed with call button within reach and bed alarm active. SLP to continue POC.    General    Pain Pain Assessment Pain Scale: 0-10 Pain Score: 4  Pain Type: Acute pain Pain Location: Arm Pain Orientation: Left Pain Descriptors / Indicators: Aching  Therapy/Group: Individual Therapy  Adela Holter 03/31/2024, 11:52 AM

## 2024-04-01 DIAGNOSIS — I63511 Cerebral infarction due to unspecified occlusion or stenosis of right middle cerebral artery: Secondary | ICD-10-CM | POA: Diagnosis not present

## 2024-04-01 MED ORDER — FLUCONAZOLE 150 MG PO TABS
150.0000 mg | ORAL_TABLET | Freq: Once | ORAL | Status: AC
  Administered 2024-04-01: 150 mg via ORAL
  Filled 2024-04-01: qty 1

## 2024-04-01 NOTE — Progress Notes (Signed)
 PROGRESS NOTE   Subjective/Complaints:  Pt reports L hand is a lot better- less pain and less swelling.  She feels gout meds are helping.  Developing a vaginal "odor"- and she never has one- denies itching quite yet, but feels getting yeast infection.    ROS:    Pt denies SOB, abd pain, CP, N/V/C/D, and vision changes   L hand pain/swelling- somewhat better  Objective:   No results found.     No results for input(s): "WBC", "HGB", "HCT", "PLT" in the last 72 hours.        No results for input(s): "NA", "K", "CL", "CO2", "GLUCOSE", "BUN", "CREATININE", "CALCIUM " in the last 72 hours.         Intake/Output Summary (Last 24 hours) at 04/01/2024 0818 Last data filed at 03/31/2024 1824 Gross per 24 hour  Intake 476 ml  Output --  Net 476 ml            Physical Exam: Vital Signs Blood pressure 121/68, pulse 80, temperature 98.1 F (36.7 C), resp. rate 16, height 5\' 6"  (1.676 m), weight 72.3 kg, SpO2 94%.     General: awake, alert, appropriate, sitting up in bed; just got breakfast;nurse in room;   NAD HENT: conjugate gaze; oropharynx moist CV: regular rate and rhythm; no JVD Pulmonary: CTA B/L; no W/R/R- good air movement GI: soft, NT, ND, (+)BS Psychiatric: appropriate Neurological: Ox3; moderate dysarthria  MAS of 1- 1+ in L wrist and hand/fingers MSK-L hand Less TTP- less swelling- not wearing glove Neuro: cognitively at baseline. Alert. Sl dysarthria. CN without changes. MAS of 0-1 in LUE Motor strength 0/5 LUE 3-/5 LLE 5/5 on right side  Skin- L PIP of 2nd digit- healing scab     Assessment/Plan: 1. Functional deficits which require 3+ hours per day of interdisciplinary therapy in a comprehensive inpatient rehab setting. Physiatrist is providing close team supervision and 24 hour management of active medical problems listed below. Physiatrist and rehab team continue to assess  barriers to discharge/monitor patient progress toward functional and medical goals  Care Tool:  Bathing    Body parts bathed by patient: Left arm, Chest, Abdomen, Front perineal area, Buttocks, Right upper leg, Left upper leg, Right lower leg, Left lower leg, Face   Body parts bathed by helper: Right arm Body parts n/a: Right arm   Bathing assist Assist Level: Minimal Assistance - Patient > 75%     Upper Body Dressing/Undressing Upper body dressing   What is the patient wearing?: Pull over shirt    Upper body assist Assist Level: Supervision/Verbal cueing    Lower Body Dressing/Undressing Lower body dressing      What is the patient wearing?: Pants, Underwear/pull up     Lower body assist Assist for lower body dressing: Minimal Assistance - Patient > 75%     Toileting Toileting    Toileting assist Assist for toileting: Moderate Assistance - Patient 50 - 74%     Transfers Chair/bed transfer  Transfers assist     Chair/bed transfer assist level: Minimal Assistance - Patient > 75%     Locomotion Ambulation   Ambulation assist      Assist level:  Moderate Assistance - Patient 50 - 74% Assistive device: Walker-hemi Max distance: 28   Walk 10 feet activity   Assist     Assist level: Moderate Assistance - Patient - 50 - 74% Assistive device: Walker-hemi   Walk 50 feet activity   Assist Walk 50 feet with 2 turns activity did not occur: Safety/medical concerns (L hemiplegia and pt fatigue)  Assist level: Moderate Assistance - Patient - 50 - 74% Assistive device: Hand held assist    Walk 150 feet activity   Assist Walk 150 feet activity did not occur: Safety/medical concerns (L hemiplegia and pt fatigue)  Assist level: Minimal Assistance - Patient > 75% Assistive device: Walker-Eva    Walk 10 feet on uneven surface  activity   Assist Walk 10 feet on uneven surfaces activity did not occur: Safety/medical concerns (L hemiplegia and pt  fatigue)         Wheelchair     Assist Is the patient using a wheelchair?: Yes Type of Wheelchair: Manual    Wheelchair assist level: Supervision/Verbal cueing Max wheelchair distance: 50    Wheelchair 50 feet with 2 turns activity    Assist        Assist Level: Supervision/Verbal cueing   Wheelchair 150 feet activity     Assist      Assist Level: Minimal Assistance - Patient > 75%   Blood pressure 121/68, pulse 80, temperature 98.1 F (36.7 C), resp. rate 16, height 5\' 6"  (1.676 m), weight 72.3 kg, SpO2 94%.   Medical Problem List and Plan: 1. 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 Search for facility ongoing. May only be able to go to custodial care facility  D/c SNF-  Con't CIR 2. DVT/anticoagulation:  Pharmaceutical: Lovenox              -antiplatelet therapy: Aspirin  81 mg daily and Plavix  75 mg daily x 3 weeks then Plavix  alone 3. Pain Management: Tylenol  as needed  3/25- denies pain- con't regimen rpn   3-30: Ongoing left hip pain; Voltaren  gel and Tylenol .    4/3- having new burning pain in LUE- which is seen sometimes with stroke- will add Duloxetine  30 mg daily for nerve pain- monitor for side effects.  -if gets nausea, will change to Lyrica 4/4-6 no side effects so far-  4/8- will increase Duloxetine  to 60 mg daily  DC hydrocodone  due to side effects, trial tyl #3  as needed for pain that is not relieved by Tylenol . 4/28- advised pt to use voltaren  gel  on all sides of L hand including between fingers- use tylenol  #3 as needed 4/29- pt admits doesn't like taking/using meds all the time- explained I don't have a "fix"- to make it better- have to take meds to have them  work 4/30- encouraged again to use voltaren  4x/day for L hand 5/1- pt said using voltaren  gel and tylenol  #3 as needed- adding lidoderm  patches 2 patches at night for back pain 5/2- HA's- will restart SCHEDULED Topamax  75 mg at bedtime- not sure what occurred? Needs to get nightly 5/3-4 appears improved with topamax  5/5- Will cont' topamax - hand still bothering her- not clear what type  of pain- but tylenol  helps some.  5/6- will increase Duloxetine  to 120 mg daily to help with nerve pain and mood 5/8- pt reports pain so much better in last 24 hours- Duloxetine  kicking in? 5/9- hasn't asked for pain meds this Am including tylenol , so wants to not register the complaint" yet today.  5/11- Add Lyrica 50 mg BID for nerve pain in L hand 5/13- changed to q6pm since pt feels it affected her vision-made it worse 5/14- said pain better today- not clear if due to prednisone  vs Lyrica? 5/15 pain much better in L hand-  pt feels it's gout? It might be 4. Depression/Behavior/Sleep: Provide emotional support  4/21 mood improved--continue duloxetine              -antipsychotic agents: N/A 5. Neuropsych/cognition: This patient is capable of making decisions on her own behalf. 6. Skin/Wound Care: Routine skin checks 7. Fluids/Electrolytes/Nutrition:encouraging po.   5/12- I personally reviewed the patient's labs today.     -encouraging PO 8.  Dysphagia.  Dysphagia 3 with thins currently  4/18 intake ok 5/11-5/13- advised pt to take smaller bites-  intake good, but takes too big of bites    5/14- switched back to full supervision- pt unhappy, but wasn't listening when I discussed more of how to eat/take bites 9.  Hyperlipidemia.  Crestor  10.  Recent History of cocaine/tobacco use.  UDS positive for cocaine.  Provide counseling, this has been a chronic issue , seen in PCP notes 11.  Hypothyroidism. continue Synthroid   12.  Constipation.   Senokot-S 2 tab twice daily  5/6- LBM yesterday even though chart  says 5/3  5/7- will add Miralax - esp since increased Duloxetine  which can cause constipation- already on Senna 2 tabs BID  5/9- LBM documented as yesterday- medium BM  5.10- LBM 2 days ago- thinks can go today  LBM 5/12  5/15- LBM yesterday 15. Blurry vision and dysconjugate gaze  3/27- will need Ophtho after d/c.   3-29: Seems to mostly be from the left eye, with difficulty converging and tracking; messaged OT and PT to trial taping versus alternating eye patch to help with convergence.  3-30: Reporting improvement with use of taping  4/4- pt reports everything blurry- cannot see faces as a result- never had eye checked in past- used readers but cannot see at a distance since stroke- will send to Ophtho after d/c.   4/7- d/w pt again- she wants eyeglasses- explained we cannot get when in hospital- also per guidelines, since vision can still change, to not get for 3 months after stroke. She disbelieves it's from stroke, since had 2 prior- that didn't cause vision changes- we educated pt pn how this can occur.   4/25- reminded pt cannot get Optho in hospital- need to get once d/c'd  4/30 went over visual changes with pt- will need to get glasses, likely prisms 2-3 months after d/c once vision stabilizes  5/6- d/w pt again- she wants to see Optho- explained they do not come into hospital for this 16.  Left hip pain.  -3-29 x-ray showing moderate degenerative joint disease of the hip; no fracture  -Continue Voltaren  gel to hip 4 times daily  -May benefit from steroid injection as an outpatient  3/31- started Vit D and Ca per pt request  - improved to an extent  4/24- Pt reports L hip still hurts when stands/works with therapy however don't want her on opiates regularly due to can impair sensorium and she also  ready has issues  17. Severe HA-daily HA's  4/15-16 sx appear improved with topamax , tylenol  helps also   -may use sx to get out of therapy at times?  4/23- Per team, pt having HA's  frequently which impairs therapy- she won't discuss HA's with me- unclear if only has a day goes on vs self limting behavior  4/24- increase to 75 mg at bedtime  4/25- still having HA's, actually thinks a little worse since not taking Norco for it-   4/28-4/29 fewer complaints about HA this AM  4/30-5/1- - per staff, c/o these less- not interfering with therapy as much  5/2- Wasn't getting topamax - just taking T#3- wil restart Topamax - don't know what occurred with order  5/6- HA's doing better  5/8- not requiring T#3 anymore lately per pt  5/13- Severe HA 5/11 in afternoon- took T#3 18. Decreased safety awareness/impulsiveness  4/30- still requiring max cues  5/7- 5/10- no change-per therapy- still requiring max cues 20. Spasticity  4/22- off baclofen , and c/o LUE pain- wondering how much of her complaints are due to tone?-   4/30- Spasticity improved in LUE- MAS of 0 actually this AM  5/11-12- Getting some spasticity vs disuse tightness inL hand- fingers esp 3rd/4th DIPs- might need Botox after d/c  5/14- MAS of 1 to 1+ in L wrist and fingers- not proximally 21. 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 .   4/30- pt pulled scab off-  22.  Urinary  and bowel incont due to CVA, cont toileting program 4/23- still cannot control B/B 4/30- on toileting program- is getting somewhat better, but still incontinent more at night  5/13- still incontinent at night  23. 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   4/29- will get Dopplers, because pt says she elevated hand overnight- still swollen- ordered  4/30-  Dopplers (-)- will get isotoner compression glove  5/4 received glove  5/6- Swelling ~ 50% better- will increase Duloxetine  to 120 mg daily for nerve pain in L hand and mood.   5/7- hand sore this AM- but not "killing her".   5/8- pain great this AM- doesn't need meds other than tylenol  per pt -sounds like increased cymbalta  has helped  5/9- Some pain in L hand this Am, but hasn't taken tylenol  or T#3 yet this Am- 5/13- Will decrease Lyrica to 50 mg q6pm since caused her to have visual changes early in day  5/14- Pain doing better in last 12+ hours- not as painful 24. C/O catch in breath- no SOB  5/9- asked pt to use ICS every 1 hour while awake- since probably is atelectasis  25. Dry eyes  5/10- ordered eye drops as needed for pt 26. Gout? Swelling of L hand  5/13- pt has never had gout but runs in her family- she wants to try something else ot see if would help L hand- will try Colchicine 0.6 mg daily x 4 days as well as Prednisone  20 mg daily x 4 days-  5/14- pt feels pain and swelling are better- not sure if due to  gout, or just prednisone  vs power of suggestion- will con't to monitor  5/15- pt feels swelling and pain is better- maybe it is gout? Vs overall improvement with prednisone  27. Vaginal candidiasis  5/15- pt reports Sx's- will give Diflucan  150 mg x1    I spent a total of  37  minutes on total care today- >50% coordination of care- due to  D/w pt's nurse, as well as pt about vaginal Sx's- as well as possible gout in LUE   LOS: 52 days A FACE TO FACE EVALUATION WAS PERFORMED  Myria Steenbergen 04/01/2024, 8:18 AM

## 2024-04-01 NOTE — Plan of Care (Signed)
  Problem: Consults Goal: RH STROKE PATIENT EDUCATION Description: See Patient Education module for education specifics  Outcome: Progressing   Problem: RH BOWEL ELIMINATION Goal: RH STG MANAGE BOWEL WITH ASSISTANCE Description: STG Manage Bowel with toileting Assistance. Outcome: Progressing Goal: RH STG MANAGE BOWEL W/MEDICATION W/ASSISTANCE Description: STG Manage Bowel with Medication with mod I Assistance. Outcome: Progressing   Problem: RH SAFETY Goal: RH STG ADHERE TO SAFETY PRECAUTIONS W/ASSISTANCE/DEVICE Description: STG Adhere to Safety Precautions With cues Assistance/Device. Outcome: Progressing   Problem: RH PAIN MANAGEMENT Goal: RH STG PAIN MANAGED AT OR BELOW PT'S PAIN GOAL Description: Pain managed < 4 with prns Outcome: Progressing   Problem: RH KNOWLEDGE DEFICIT Goal: RH STG INCREASE KNOWLEDGE OF HYPERTENSION Description: Patient and mother will be able to manage HTN using educational resources for medications and dietary modification independently Outcome: Progressing Goal: RH STG INCREASE KNOWLEDGE OF DYSPHAGIA/FLUID INTAKE Description: Patient and mother will be able to manage dysphagia using educational resources for medications and dietary modification independently Outcome: Progressing Goal: RH STG INCREASE KNOWLEGDE OF HYPERLIPIDEMIA Description: Patient and mother will be able to manage HLD using educational resources for medications and dietary modification independently Outcome: Progressing Goal: RH STG INCREASE KNOWLEDGE OF STROKE PROPHYLAXIS Description: Patient and mother will be able to manage secondary risks using educational resources for medications and dietary modification independently Outcome: Progressing   Problem: Consults Goal: RH GENERAL PATIENT EDUCATION Description: See Patient Education module for education specifics. Outcome: Progressing Goal: Skin Care Protocol Initiated - if Braden Score 18 or less Description: If consults are  not indicated, leave blank or document N/A Outcome: Progressing Goal: Nutrition Consult-if indicated Outcome: Progressing Goal: Diabetes Guidelines if Diabetic/Glucose > 140 Description: If diabetic or lab glucose is > 140 mg/dl - Initiate Diabetes/Hyperglycemia Guidelines & Document Interventions  Outcome: Progressing   Problem: RH BOWEL ELIMINATION Goal: RH STG MANAGE BOWEL WITH ASSISTANCE Description: STG Manage Bowel with Assistance. Outcome: Progressing Goal: RH STG MANAGE BOWEL W/MEDICATION W/ASSISTANCE Description: STG Manage Bowel with Medication with Assistance. Outcome: Progressing   Problem: RH BLADDER ELIMINATION Goal: RH STG MANAGE BLADDER WITH ASSISTANCE Description: STG Manage Bladder With Assistance Outcome: Progressing Goal: RH STG MANAGE BLADDER WITH MEDICATION WITH ASSISTANCE Description: STG Manage Bladder With Medication With Assistance. Outcome: Progressing Goal: RH STG MANAGE BLADDER WITH EQUIPMENT WITH ASSISTANCE Description: STG Manage Bladder With Equipment With Assistance Outcome: Progressing   Problem: RH SKIN INTEGRITY Goal: RH STG SKIN FREE OF INFECTION/BREAKDOWN Outcome: Progressing Goal: RH STG MAINTAIN SKIN INTEGRITY WITH ASSISTANCE Description: STG Maintain Skin Integrity With Assistance. Outcome: Progressing Goal: RH STG ABLE TO PERFORM INCISION/WOUND CARE W/ASSISTANCE Description: STG Able To Perform Incision/Wound Care With Assistance. Outcome: Progressing   Problem: RH SAFETY Goal: RH STG ADHERE TO SAFETY PRECAUTIONS W/ASSISTANCE/DEVICE Description: STG Adhere to Safety Precautions With Assistance/Device. Outcome: Progressing Goal: RH STG DECREASED RISK OF FALL WITH ASSISTANCE Description: STG Decreased Risk of Fall With Assistance. Outcome: Progressing   Problem: RH PAIN MANAGEMENT Goal: RH STG PAIN MANAGED AT OR BELOW PT'S PAIN GOAL Outcome: Progressing   Problem: RH KNOWLEDGE DEFICIT GENERAL Goal: RH STG INCREASE KNOWLEDGE OF  SELF CARE AFTER HOSPITALIZATION Outcome: Progressing

## 2024-04-01 NOTE — Progress Notes (Signed)
 Patient ID: Theresa Watkins, female   DOB: 11/11/1966, 58 y.o.   MRN: [id number]  SW made efforts to get in contact with [facility] Placement SW, but on hold for over 30 minutes. SW will make another attempt.   Norval Been, MSW, LCSW Office: [phone] Cell: [phone] Fax: [phone]

## 2024-04-01 NOTE — Progress Notes (Signed)
 Physical Therapy Session Note  Patient Details  Name: Theresa Watkins MRN: [id number] Date of Birth: 1965-12-19  Today's Date: 04/01/2024 PT Individual Time: 4098-1191, 1300-1330 PT Individual Time Calculation (min): 35 min , and 30 min , and Today's Date: 04/01/2024 PT Missed Time: 10 Minutes Missed Time Reason: Other (Comment) (eating breakfast)  Short Term Goals: Week 7:  PT Short Term Goal 1 (Week 7): pt will initiate stair navigation PT Short Term Goal 2 (Week 7): pt will initiate fall recovery training  Skilled Therapeutic Interventions/Progress Updates:    Session 1: pt received in bed and agreeable to therapy, but still eating breakfast. Pt with bout of coughing and required suction to clear throat after eating grits. Assisted pt with sitting more upright in bed and therapist took over supervision from NT. Extended time with suction and repeated coughs and liquid wash required to clear. Pt reports grits are the only thing she has an issue with, provided education on ordering something else if they are difficult.   Pt then participated in bed level exercise for ankle and L foot, including DF against yellow theraband to improve dorsiflexion during gait and eversion to minimize inversion during gait. Educated pt on performing ankle circles in the evenings to continue strengthening ankle musculature. Assisted pt with positioning for LUE and encouraged independent movement to improve edema. Pt was left with all needs in reach and alarm active.   Session 2: pt received in bed and agreeable to therapy. Pt assisted with changing clothes for time management. Pt able to don brief with increased time and complete donning shirt after arms threaded. Assist to pull pants over hips in standing. Min a Stand pivot transfer to w/c. Pt transported to therapy gym for time management and energy conservation. Pt then participated in x 2 bouts of gait training, x 40 ft and x 5 ft fwd and bwd. Required min a  and hemi walker, pt demoing improved L knee stability and foot clearance. Pt returned to room and remained sitting up in w/c with needs in reach.    Therapy Documentation Precautions:  Precautions Precautions: Fall Precaution/Restrictions Comments: L hemi Restrictions Weight Bearing Restrictions Per Provider Order: No General:     Therapy/Group: Individual Therapy  Azazel Franze C Trasean Delima 04/01/2024, 8:24 AM

## 2024-04-01 NOTE — Progress Notes (Signed)
 Occupational Therapy Session Note  Patient Details  Name: Theresa Watkins MRN: [id number] Date of Birth: 25-Jan-1966  Today's Date: 04/01/2024 OT Individual Time: 0930-1015 OT Individual Time Calculation (min): 45 min    Short Term Goals: Week 7:  OT Short Term Goal 1 (Week 7): Pt will perfrom toileting tasks with min A OT Short Term Goal 2 (Week 7): Pt will use LUE as gross assist during functional tasks with mod A  Skilled Therapeutic Interventions/Progress Updates:    Pt resting in bed upon arrival, c/o nausea but agreeable to "trying" some therapy at bed level. Pt declined repositioning in bed even though she had slid down to footboard and was diagonal, stating she "liked it that way." LUE/hand edema improved and pt declined ace wrap. OT intervention with focus on LUE AROM/SROM/AAROM reaching out and up, in addition to finger flexion/extension. Pt issued yellow and pink foam to facilitate improved funcitonal grasp. Pt declined final 30 mins skilled OT serives 2/2 nausea. Pt remained in bed with all needs wihtin reach. Will attempt to see pt as schedule allows.   Therapy Documentation Precautions:  Precautions Precautions: Fall Precaution/Restrictions Comments: L hemi Restrictions Weight Bearing Restrictions Per Provider Order: No    Pain: Pt c/o nausea; nursing aware   Therapy/Group: Individual Therapy  [name] 04/01/2024, 10:45 AM

## 2024-04-01 NOTE — Progress Notes (Signed)
 Recreational Therapy Session Note  Patient Details  Name: [name] MRN: [id number] Date of Birth: [birth year] Today's Date: 04/01/2024 Pt will participate at least 30 minutes in 60 minute therapeutic dance group at supervision level.  MET  Group Description: Dance Group: Pt participated in dance group with an emphasis on social interaction, motor planning, increasing overall activity tolerance and bimanual tasks. All songs were selected by group members. Dance moves included AROM of BUE/BLE gross motor movements with an emphasis on building functional endurance.    Individual level documentation: Patient completed group from sitting level with supervision.  Patient needed supervision to complete various dance moves.  Patient required minimal  modifications during group.  Pain:no c/o   Francina Beery 04/01/2024, 2:19 PM

## 2024-04-01 NOTE — Progress Notes (Signed)
 Speech Language Pathology Daily Session Note  Patient Details  Name: Theresa Watkins MRN: [id number] Date of Birth: [birth year]  Today's Date: 04/01/2024 SLP Individual Time: 1130-1200 SLP Individual Time Calculation (min): 30 min  Short Term Goals: Week 8: SLP Short Term Goal 1 (Week 8): Patient will communicate complex information with 90% intelligibility and sup A SLP Short Term Goal 2 (Week 8): Patient will utilize swallowing compensatory strategies twith sup A in order to decrease risk of aspiration of regular solids and thin liquids/  Skilled Therapeutic Interventions: Skilled therapy session focused on education regarding dysphagia strategies and communication. Upon entrance, patient reported she had a choking episode this AM, however does not feel she needs full supervision. SLP educated patient on need for full supervision to ensure proper positioning, slow rate, small bites/sips and clearance of oral residuals. Patient verbalized understanding. SLP targeted communication goals through review of speech intelligibility strategies and completion of 25 repetitions of EMST set at 35 cm H2O. Patient was ~90% intelligibile at the conversational level. Patient left in bed with alarm set and call bell in reach. Continue POC.    Pain None reported   Therapy/Group: Individual Therapy  Kamie Korber M.A., CCC-SLP 04/01/2024, 7:48 AM

## 2024-04-01 NOTE — Progress Notes (Signed)
 Occupational Therapy Weekly Progress Note  Patient Details  Name: Theresa Watkins MRN: [id number] Date of Birth: 1966/04/23  Beginning of progress report period: Mar 26, 2024 End of progress report period: Apr 01, 2024  Patient has met 0 of 2 short term goals.  Pt progress has been inconsistent during this past week. Pt is often lethargic or c/o nausea/pain and unable to fully participate in therapies. LUE with decreased edema. Pt initiating use of LUE during bathing/dressing tasks but continues to required max A to use as gross stabilizer. Squat pivot transfers with min A and mod verbal cues for safety awareness. Pt continue to exhibit unsafe behaviors during transitional movements. Bathing with min A at shower level. UB dressing with min A and mod A for LB dressing. Awaiting SNF placement.   Patient continues to demonstrate the following deficits: muscle weakness, decreased cardiorespiratoy endurance, impaired timing and sequencing, abnormal tone, unbalanced muscle activation, and decreased coordination, decreased attention to left, decreased safety awareness, and decreased standing balance and decreased balance strategies and therefore will continue to benefit from skilled OT intervention to enhance overall performance with BADL and Reduce care partner burden.  Patient progressing toward long term goals..  Continue plan of care.  OT Short Term Goals Week 7:  OT Short Term Goal 1 (Week 7): Pt will perfrom toileting tasks with min A OT Short Term Goal 2 (Week 7): Pt will use LUE as gross assist during functional tasks with mod A Week 8: OT Short Term Goal 1 (Week 8): Pt will perfrom toileting tasks with min A OT Short Term Goal 2 (Week 8): Pt will use LUE as gross assist during functional tasks with mod A  Therapy Documentation Precautions:  Precautions Precautions: Fall Precaution/Restrictions Comments: L hemi Restrictions Weight Bearing Restrictions Per Provider Order:  No   Therapy/Group: Individual Therapy  Prepared by: [name] 04/01/2024, 2:29 PM   Signed by: Artemus Biles, OTR/L, MSOT 04/02/24

## 2024-04-01 NOTE — Progress Notes (Signed)
 Patient ID: Theresa Watkins, female   DOB: Sep 15, 1966, 58 y.o.   MRN: [id number]  SW called Marlou Sims with [facility] [phone]) and requested assistance with getting in contact with [employer] placement SW.   SW called pt son Vonii to provide updates.   Norval Been, MSW, LCSW Office: [phone] Cell: [phone] Fax: [phone]

## 2024-04-02 DIAGNOSIS — I63511 Cerebral infarction due to unspecified occlusion or stenosis of right middle cerebral artery: Secondary | ICD-10-CM | POA: Diagnosis not present

## 2024-04-02 NOTE — Progress Notes (Signed)
 Occupational Therapy Session Note  Patient Details  Name: Theresa Watkins MRN: [id number] Date of Birth: 12-Jan-1966  Today's Date: 04/02/2024 OT Individual Time: 1350-1445 OT Individual Time Calculation (min): 55 min    Short Term Goals: Week 7:  OT Short Term Goal 1 (Week 7): Pt will perfrom toileting tasks with min A OT Short Term Goal 2 (Week 7): Pt will use LUE as gross assist during functional tasks with mod A  Skilled Therapeutic Interventions/Progress Updates:  Pt received resting in bed with no reports of pain. Pt transitions to EOB with supervision + use of bed features. Stand-pivots throughout session with Min A (HHA). Pt toilets with Min A for lowering brief, lateral leans for pericare, immediate transition to shower ambulating from toilet>TTB in walk-in shower with Min A (HHA). Pt doffs night-gown with light Min A Bathing with Min A provided for B under-arm and backside thouroughness. UB dressing with Min A for management of material stuck underneath L-undearm. Pt requires Mod A for LB dressing including threading of LLE and hiking over L-hip. Pt resistive to using hemi-dressing techniques, using LUE as gross assist during ADL. Pt remained resting in bed with bed alarm activated.   Therapy Documentation Precautions:  Precautions Precautions: Fall Precaution/Restrictions Comments: L hemi Restrictions Weight Bearing Restrictions Per Provider Order: No   Therapy/Group: Individual Therapy  Artemus Biles, OTR/L, MSOT  04/02/2024, 6:30 AM

## 2024-04-02 NOTE — Progress Notes (Signed)
 Physical Therapy Session Note  Patient Details  Name: Theresa Watkins MRN: [id number] Date of Birth: Apr 24, 1966  Today's Date: 04/02/2024 PT Individual Time: 1015-1100, 1500-1530 PT Individual Time Calculation (min): 45 min, 30 min   Short Term Goals: Week 7:  PT Short Term Goal 1 (Week 7): pt will initiate stair navigation PT Short Term Goal 2 (Week 7): pt will initiate fall recovery training  Skilled Therapeutic Interventions/Progress Updates:    Session 1: pt received in bed and agreeable to therapy. No complaint of pain. Pt assisted with donning brief and threading pants. Donned pull over shirt with set up. Pt needed min to pull up pants on L side while maintaining balance with light min a and no UE support.   Session focused on gait with hemi walker, 2 x 10 ft in room, 4 x 20 ft and 1 x 50 ft with min a and verbal cueing for weight shift, foot clearance, and increasing LLE stance time. Pt returned room and to bed, doffing clothing with assist for time, was left with all needs in reach and alarm active.   Session 2: pt received in bed and agreeable to therapy. No complaint of pain. Bed mobility with supervision. Pt able to don shoes with set up and increased time.   Stand pivot transfer with hemi walker and min a for safety, with Sit to stand with CGA. Cueing for retropulsion and balance. Pt transported to therapy gym for time management and energy conservation.   Session focused on floor transfers. Discussed fall recovery and when to activate EMS, pt demoed good awareness of her condition and increased risk of further strokes. First attempt required max a for safety, but pt was able to roll onto mat on second attempt with min a. Discussed plan for working towards floor transfer into standing for improved LE strength and independence. Pt returned to room and remained in chair with needs in reach.   Therapy Documentation Precautions:  Precautions Precautions:  Fall Precaution/Restrictions Comments: L hemi Restrictions Weight Bearing Restrictions Per Provider Order: No General:       Therapy/Group: Individual Therapy  Aniceto Kyser C Yavier Snider 04/02/2024, 1:16 PM

## 2024-04-02 NOTE — Progress Notes (Signed)
 PROGRESS NOTE   Subjective/Complaints:   Wrapped L hand- with ACE wrap- helped a little.   Just woke up.  Hopes to have a good day.  Choking  when didn't drink or eat- but handled well.   LBM 2 days ago.    ROS:     Pt denies SOB, abd pain, CP, N/V/C/D, and vision changes  L hand pain/swelling- somewhat better  Objective:   No results found.     No results for input(s): "WBC", "HGB", "HCT", "PLT" in the last 72 hours.        No results for input(s): "NA", "K", "CL", "CO2", "GLUCOSE", "BUN", "CREATININE", "CALCIUM " in the last 72 hours.         Intake/Output Summary (Last 24 hours) at 04/02/2024 0836 Last data filed at 04/02/2024 0800 Gross per 24 hour  Intake 949 ml  Output --  Net 949 ml            Physical Exam: Vital Signs Blood pressure (!) 142/85, pulse 72, temperature 98.6 F (37 C), temperature source Oral, resp. rate 18, height 5\' 6"  (1.676 m), weight 72.3 kg, SpO2 99%.      General: awake, alert, appropriate, stopped eating to speak with me; NT at bedside; NAD HENT: conjugate gaze; oropharynx moist CV: regular rate and rhythm; no JVD Pulmonary: CTA B/L; no W/R/R- good air movement GI: soft, NT, ND, (+)BS Psychiatric: appropriate Neurological: Ox3 Stable mod dysarthria- sometimes cannot understand esp in early AM MAS of 1- 1+ in L wrist and hand/fingers MSK-L hand Less TTP- less swelling- not wearing glove Neuro: cognitively at baseline. Alert. Sl dysarthria. CN without changes. MAS of 0-1 in LUE Motor strength 0/5 LUE 3-/5 LLE 5/5 on right side  Skin- L PIP of 2nd digit- healing scab     Assessment/Plan: 1. Functional deficits which require 3+ hours per day of interdisciplinary therapy in a comprehensive inpatient rehab setting. Physiatrist is providing close team supervision and 24 hour management of active medical problems listed below. Physiatrist and rehab team  continue to assess barriers to discharge/monitor patient progress toward functional and medical goals  Care Tool:  Bathing    Body parts bathed by patient: Left arm, Chest, Abdomen, Front perineal area, Buttocks, Right upper leg, Left upper leg, Right lower leg, Left lower leg, Face   Body parts bathed by helper: Right arm Body parts n/a: Right arm   Bathing assist Assist Level: Minimal Assistance - Patient > 75%     Upper Body Dressing/Undressing Upper body dressing   What is the patient wearing?: Pull over shirt    Upper body assist Assist Level: Supervision/Verbal cueing    Lower Body Dressing/Undressing Lower body dressing      What is the patient wearing?: Pants, Underwear/pull up     Lower body assist Assist for lower body dressing: Minimal Assistance - Patient > 75%     Toileting Toileting    Toileting assist Assist for toileting: Moderate Assistance - Patient 50 - 74%     Transfers Chair/bed transfer  Transfers assist     Chair/bed transfer assist level: Minimal Assistance - Patient > 75%     Locomotion Ambulation  Ambulation assist      Assist level: Moderate Assistance - Patient 50 - 74% Assistive device: Walker-hemi Max distance: 28   Walk 10 feet activity   Assist     Assist level: Moderate Assistance - Patient - 50 - 74% Assistive device: Walker-hemi   Walk 50 feet activity   Assist Walk 50 feet with 2 turns activity did not occur: Safety/medical concerns (L hemiplegia and pt fatigue)  Assist level: Moderate Assistance - Patient - 50 - 74% Assistive device: Hand held assist    Walk 150 feet activity   Assist Walk 150 feet activity did not occur: Safety/medical concerns (L hemiplegia and pt fatigue)  Assist level: Minimal Assistance - Patient > 75% Assistive device: Walker-Eva    Walk 10 feet on uneven surface  activity   Assist Walk 10 feet on uneven surfaces activity did not occur: Safety/medical concerns (L  hemiplegia and pt fatigue)         Wheelchair     Assist Is the patient using a wheelchair?: Yes Type of Wheelchair: Manual    Wheelchair assist level: Supervision/Verbal cueing Max wheelchair distance: 50    Wheelchair 50 feet with 2 turns activity    Assist        Assist Level: Supervision/Verbal cueing   Wheelchair 150 feet activity     Assist      Assist Level: Minimal Assistance - Patient > 75%   Blood pressure (!) 142/85, pulse 72, temperature 98.6 F (37 C), temperature source Oral, resp. rate 18, height 5\' 6"  (1.676 m), weight 72.3 kg, SpO2 99%.   Medical Problem List and Plan: 1. 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 Search for facility ongoing. May only be able to go to custodial care facility  D/c SNF-  Con't CIR 2. DVT/anticoagulation:  Pharmaceutical: Lovenox              -antiplatelet therapy: Aspirin  81 mg daily and Plavix  75 mg daily x 3 weeks then Plavix  alone 3. Pain Management: Tylenol  as needed  3/25- denies pain- con't regimen rpn   3-30: Ongoing left hip pain; Voltaren  gel and Tylenol .    4/3- having new burning pain in LUE- which is seen sometimes with stroke- will add Duloxetine  30 mg daily for nerve pain- monitor for side effects.  -if gets nausea, will change to Lyrica 4/4-6 no side effects so far-  4/8- will increase Duloxetine  to 60 mg daily  DC hydrocodone  due to side effects, trial tyl #3  as needed for pain that is not relieved by Tylenol . 4/28- advised pt to use voltaren  gel  on all sides of L hand including between fingers- use tylenol  #3 as needed 4/29- pt admits doesn't like taking/using meds all the time- explained I don't have a "fix"- to make it  better- have to take meds to have them work 4/30- encouraged again to use voltaren  4x/day for L hand 5/1- pt said using voltaren  gel and tylenol  #3 as needed- adding lidoderm  patches 2 patches at night for back pain 5/2- HA's- will restart SCHEDULED Topamax  75 mg at bedtime- not sure what occurred? Needs to get nightly 5/3-4 appears improved with  topamax  5/5- Will cont' topamax - hand still bothering her- not clear what type of pain- but tylenol  helps some.  5/6- will increase Duloxetine  to 120 mg daily to help with nerve pain and mood 5/8- pt reports pain so much better in last 24 hours- Duloxetine  kicking in? 5/9- hasn't asked for pain meds this Am including tylenol , so wants to not register the complaint" yet today.  5/11- Add Lyrica 50 mg BID for nerve pain in L hand 5/13- changed to q6pm since pt feels it affected her vision-made it worse 5/14- said pain better today- not clear if due to prednisone  vs Lyrica? 5/15 pain much better in L hand-  pt feels it's gout? It might be 5/16- hand pain slightly better with ACE wrap overnight- which is fine 4. Depression/Behavior/Sleep: Provide emotional support  4/21 mood improved--continue duloxetine              -antipsychotic agents: N/A 5. Neuropsych/cognition: This patient is capable of making decisions on her own behalf. 6. Skin/Wound Care: Routine skin checks 7. Fluids/Electrolytes/Nutrition:encouraging po.   5/12- I personally reviewed the patient's labs today.     -encouraging PO 8.  Dysphagia.  Dysphagia 3 with thins currently  4/18 intake ok 5/11-5/13- advised pt to take smaller bites-  intake good, but takes too big of bites    5/14- switched back to full supervision- pt unhappy, but wasn't listening when I discussed more of how to eat/take bites 5/16- taking smaller bites with full supervision 9.  Hyperlipidemia.  Crestor  10.  Recent History of cocaine/tobacco use.  UDS positive for cocaine.  Provide counseling, this has been a chronic  issue , seen in PCP notes 11.  Hypothyroidism. continue Synthroid   12.  Constipation.   Senokot-S 2 tab twice daily  5/6- LBM yesterday even though chart says 5/3  5/7- will add Miralax - esp since increased Duloxetine  which can cause constipation- already on Senna 2 tabs BID  5/9- LBM documented as yesterday- medium BM  5.10- LBM 2 days ago- thinks can go today  LBM 5/12  5/15- LBM yesterday 15. Blurry vision and dysconjugate gaze  3/27- will need Ophtho after d/c.   3-29: Seems to mostly be from the left eye, with difficulty converging and tracking; messaged OT and PT to trial taping versus alternating eye patch to help with convergence.  3-30: Reporting improvement with use of taping  4/4- pt reports everything blurry- cannot see faces as a result- never had eye checked in past- used readers but cannot see at a distance since stroke- will send to Ophtho after d/c.   4/7- d/w pt again- she wants eyeglasses- explained we cannot get when in hospital- also per guidelines, since vision can still change, to not get for 3 months after stroke. She disbelieves it's from stroke, since had 2 prior- that didn't cause vision changes- we educated pt pn how this can occur.   4/25- reminded pt cannot get Optho in hospital- need to get once d/c'd  4/30 went over visual changes with pt- will need to get glasses, likely prisms 2-3 months after d/c once vision stabilizes  5/6- d/w pt again- she wants to see Optho- explained they do not come into hospital for this 16.  Left hip pain.  -3-29 x-ray showing moderate degenerative joint disease of the hip; no fracture  -Continue Voltaren  gel to hip 4 times daily  -May benefit from steroid injection as an outpatient  3/31- started Vit D and Ca per pt request  -  improved to an extent  4/24- Pt reports L hip still hurts when stands/works with therapy however don't want her on opiates regularly due to can impair sensorium and she also ready has issues  17. Severe  HA-daily HA's  4/15-16 sx appear improved with topamax , tylenol  helps also   -may use sx to get out of therapy at times?  4/23- Per team, pt having HA's frequently which impairs therapy- she won't discuss HA's with me- unclear if only has a day goes on vs self limting behavior  4/24- increase to 75 mg at bedtime  4/25- still having HA's, actually thinks a little worse since not taking Norco for it-   4/28-4/29 fewer complaints about HA this AM  4/30-5/1- - per staff, c/o these less- not interfering with therapy as much  5/2- Wasn't getting topamax - just taking T#3- wil restart Topamax - don't know what occurred with order  5/6- HA's doing better  5/8- not requiring T#3 anymore lately per pt  5/13- Severe HA 5/11 in afternoon- took T#3 18. Decreased safety awareness/impulsiveness  4/30- still requiring max cues  5/7- 5/10- no change-per therapy- still requiring max cues 20. Spasticity  4/22- off baclofen , and c/o LUE pain- wondering how much of her complaints are due to tone?-   4/30- Spasticity improved in LUE- MAS of 0 actually this AM  5/11-12- Getting some spasticity vs disuse tightness inL hand- fingers esp 3rd/4th DIPs- might need Botox after d/c  5/14- MAS of 1 to 1+ in L wrist and fingers- not proximally 21. 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 .   4/30- pt pulled scab off-  22.  Urinary  and bowel incont due to CVA, cont toileting program 4/23- still cannot control B/B 4/30- on toileting program- is getting somewhat better, but still incontinent more at night  5/13- still incontinent at night  23. 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   4/29- will get Dopplers, because pt says she elevated hand overnight- still swollen- ordered  4/30- Dopplers (-)- will get isotoner compression glove  5/4 received glove  5/6- Swelling ~ 50% better- will increase Duloxetine  to 120 mg daily for nerve pain in L hand and mood.   5/7- hand sore this AM- but not "killing her".   5/8- pain great this AM- doesn't need meds other than tylenol  per pt -sounds like increased cymbalta  has helped  5/9- Some pain in L hand this Am, but hasn't taken tylenol  or T#3 yet this Am- 5/13- Will decrease Lyrica to 50 mg q6pm since caused her to have visual changes early in day  5/14- Pain doing better in last 12+ hours- not as painful 24. C/O catch in breath- no SOB  5/9- asked pt to use ICS every 1 hour while awake- since probably is atelectasis  25. Dry eyes  5/10- ordered eye drops as needed for pt 26. Gout? Swelling of L hand  5/13- pt has never had gout but runs in her family- she wants to try something else ot see if would help L hand- will try  Colchicine 0.6 mg daily x 4 days as well as Prednisone  20 mg daily x 4 days-  5/14- pt feels pain and swelling are better- not sure if due to gout, or just prednisone  vs power of suggestion- will con't to monitor  5/15- pt feels swelling and pain is better- maybe it is gout? Vs overall improvement with prednisone  27. Vaginal candidiasis  5/15- pt reports Sx's- will give Diflucan  150 mg x1     LOS: 53 days A FACE TO FACE EVALUATION WAS PERFORMED  Kee Drudge 04/02/2024, 8:36 AM

## 2024-04-02 NOTE — Plan of Care (Signed)
  Problem: Consults Goal: RH STROKE PATIENT EDUCATION Description: See Patient Education module for education specifics  Outcome: Progressing   Problem: RH BOWEL ELIMINATION Goal: RH STG MANAGE BOWEL WITH ASSISTANCE Description: STG Manage Bowel with toileting Assistance. Outcome: Progressing Goal: RH STG MANAGE BOWEL W/MEDICATION W/ASSISTANCE Description: STG Manage Bowel with Medication with mod I Assistance. Outcome: Progressing   Problem: RH SAFETY Goal: RH STG ADHERE TO SAFETY PRECAUTIONS W/ASSISTANCE/DEVICE Description: STG Adhere to Safety Precautions With cues Assistance/Device. Outcome: Progressing   Problem: RH PAIN MANAGEMENT Goal: RH STG PAIN MANAGED AT OR BELOW PT'S PAIN GOAL Description: Pain managed < 4 with prns Outcome: Progressing   Problem: RH KNOWLEDGE DEFICIT Goal: RH STG INCREASE KNOWLEDGE OF HYPERTENSION Description: Patient and mother will be able to manage HTN using educational resources for medications and dietary modification independently Outcome: Progressing Goal: RH STG INCREASE KNOWLEDGE OF DYSPHAGIA/FLUID INTAKE Description: Patient and mother will be able to manage dysphagia using educational resources for medications and dietary modification independently Outcome: Progressing Goal: RH STG INCREASE KNOWLEGDE OF HYPERLIPIDEMIA Description: Patient and mother will be able to manage HLD using educational resources for medications and dietary modification independently Outcome: Progressing Goal: RH STG INCREASE KNOWLEDGE OF STROKE PROPHYLAXIS Description: Patient and mother will be able to manage secondary risks using educational resources for medications and dietary modification independently Outcome: Progressing   Problem: Consults Goal: RH GENERAL PATIENT EDUCATION Description: See Patient Education module for education specifics. Outcome: Progressing Goal: Skin Care Protocol Initiated - if Braden Score 18 or less Description: If consults are  not indicated, leave blank or document N/A Outcome: Progressing Goal: Nutrition Consult-if indicated Outcome: Progressing Goal: Diabetes Guidelines if Diabetic/Glucose > 140 Description: If diabetic or lab glucose is > 140 mg/dl - Initiate Diabetes/Hyperglycemia Guidelines & Document Interventions  Outcome: Progressing   Problem: RH BOWEL ELIMINATION Goal: RH STG MANAGE BOWEL WITH ASSISTANCE Description: STG Manage Bowel with Assistance. Outcome: Progressing Goal: RH STG MANAGE BOWEL W/MEDICATION W/ASSISTANCE Description: STG Manage Bowel with Medication with Assistance. Outcome: Progressing   Problem: RH BLADDER ELIMINATION Goal: RH STG MANAGE BLADDER WITH ASSISTANCE Description: STG Manage Bladder With Assistance Outcome: Progressing Goal: RH STG MANAGE BLADDER WITH MEDICATION WITH ASSISTANCE Description: STG Manage Bladder With Medication With Assistance. Outcome: Progressing Goal: RH STG MANAGE BLADDER WITH EQUIPMENT WITH ASSISTANCE Description: STG Manage Bladder With Equipment With Assistance Outcome: Progressing   Problem: RH SKIN INTEGRITY Goal: RH STG SKIN FREE OF INFECTION/BREAKDOWN Outcome: Progressing Goal: RH STG MAINTAIN SKIN INTEGRITY WITH ASSISTANCE Description: STG Maintain Skin Integrity With Assistance. Outcome: Progressing Goal: RH STG ABLE TO PERFORM INCISION/WOUND CARE W/ASSISTANCE Description: STG Able To Perform Incision/Wound Care With Assistance. Outcome: Progressing   Problem: RH SAFETY Goal: RH STG ADHERE TO SAFETY PRECAUTIONS W/ASSISTANCE/DEVICE Description: STG Adhere to Safety Precautions With Assistance/Device. Outcome: Progressing Goal: RH STG DECREASED RISK OF FALL WITH ASSISTANCE Description: STG Decreased Risk of Fall With Assistance. Outcome: Progressing   Problem: RH PAIN MANAGEMENT Goal: RH STG PAIN MANAGED AT OR BELOW PT'S PAIN GOAL Outcome: Progressing   Problem: RH KNOWLEDGE DEFICIT GENERAL Goal: RH STG INCREASE KNOWLEDGE OF  SELF CARE AFTER HOSPITALIZATION Outcome: Progressing

## 2024-04-02 NOTE — Plan of Care (Signed)
  Problem: Consults Goal: RH STROKE PATIENT EDUCATION Description: See Patient Education module for education specifics  Outcome: Progressing Note: Knows her risks, knows why she takes her medication and how it is relative to her status, knows the warning signs and when to ask for help   Problem: RH BOWEL ELIMINATION Goal: RH STG MANAGE BOWEL WITH ASSISTANCE Description: STG Manage Bowel with toileting Assistance. Outcome: Progressing Goal: RH STG MANAGE BOWEL W/MEDICATION W/ASSISTANCE Description: STG Manage Bowel with Medication with mod I Assistance. Outcome: Progressing Flowsheets (Taken 04/02/2024 1659) STG: Pt will manage bowels with medication with assistance: Other (Comment) Note: Patient starting to understand when she needs to take medication to aid in bowel evacuation,    Problem: RH SAFETY Goal: RH STG ADHERE TO SAFETY PRECAUTIONS W/ASSISTANCE/DEVICE Description: STG Adhere to Safety Precautions With cues Assistance/Device. Outcome: Adequate for Discharge Note: Knows completely the rules on safety and as well how to be safe when she gets home from a  medication/mobility perspective, she is progressing very well,

## 2024-04-02 NOTE — Progress Notes (Signed)
 SLP Cancellation Note  Patient Details Name: Theresa Watkins MRN: [id number] DOB: 1966-01-27   Cancelled treatment:        SLP attempted to conduct skilled therapy session targeting communication and swallowing goals. Upon entry, patient laying in bed. Patient references schedule and states "You're not on my schedule". When SLP provided education about extra minutes and change to schedule since hers was printed, patient declined any additional therapy tasks. SLP attempted to provide encouragement, however patient continued to decline services. SLP will continue with POC as able.                                                                                             [name], M.A., CCC-SLP  [name] [name] 04/02/2024, 9:24 AM

## 2024-04-03 DIAGNOSIS — R519 Headache, unspecified: Secondary | ICD-10-CM | POA: Diagnosis not present

## 2024-04-03 DIAGNOSIS — I63511 Cerebral infarction due to unspecified occlusion or stenosis of right middle cerebral artery: Secondary | ICD-10-CM | POA: Diagnosis not present

## 2024-04-03 DIAGNOSIS — R32 Unspecified urinary incontinence: Secondary | ICD-10-CM | POA: Diagnosis not present

## 2024-04-03 DIAGNOSIS — H538 Other visual disturbances: Secondary | ICD-10-CM

## 2024-04-03 DIAGNOSIS — K5900 Constipation, unspecified: Secondary | ICD-10-CM | POA: Diagnosis not present

## 2024-04-03 MED ORDER — SORBITOL 70 % SOLN: 30.0000 mL | Freq: Every day | Status: DC | PRN

## 2024-04-03 MED ORDER — POLYETHYLENE GLYCOL 3350 17 G PO PACK
17.0000 g | PACK | Freq: Two times a day (BID) | ORAL | Status: DC
  Filled 2024-04-03 (×9): qty 1

## 2024-04-03 NOTE — Progress Notes (Signed)
 Occupational Therapy Session Note  Patient Details  Name: Theresa Watkins MRN: [id number] Date of Birth: 29-Dec-1965  Today's Date: 04/03/2024 OT Missed Time: 45 Minutes Missed Time Reason: Pain (migraine)   Short Term Goals: Week 4:  OT Short Term Goal 1 (Week 4): Pt will complete LB dressing tasks with mod A OT Short Term Goal 1 - Progress (Week 4): Progressing toward goal OT Short Term Goal 2 (Week 4): Pt will complete UB dressing tasks with mod A OT Short Term Goal 2 - Progress (Week 4): Met OT Short Term Goal 3 (Week 4): Pt will complete toileting tasks with mod A OT Short Term Goal 3 - Progress (Week 4): Met Week 5:  OT Short Term Goal 1 (Week 5): Pt will complete LB dressing tasks with mod A OT Short Term Goal 1 - Progress (Week 5): Met OT Short Term Goal 2 (Week 5): Pt will perform UB dressing tasks with min A OT Short Term Goal 2 - Progress (Week 5): Progressing toward goal OT Short Term Goal 3 (Week 5): Pt will perfrom toileting tasks with min A OT Short Term Goal 3 - Progress (Week 5): Progressing toward goal Week 6:  OT Short Term Goal 1 (Week 6): Pt will perform UB dressing tasks with min A OT Short Term Goal 1 - Progress (Week 6): Met OT Short Term Goal 2 (Week 6): Pt will perfrom toileting tasks with min A OT Short Term Goal 2 - Progress (Week 6): Progressing toward goal  Skilled Therapeutic Interventions/Progress Updates:    Pt bed level at time of session - asleep but woken with verbal stimuli. Stating she has a migraine and not able to participate at this time - nursing aware and pt already had meds. Missed 45 mins of OT 2/2 migraine.  Therapy Documentation Precautions:  Precautions Precautions: Fall Precaution/Restrictions Comments: L hemi Restrictions Weight Bearing Restrictions Per Provider Order: No General: General OT Amount of Missed Time: 45 Minutes     Therapy/Group: Individual Therapy  Doroteo Gasmen 04/03/2024, 2:54 PM

## 2024-04-03 NOTE — Progress Notes (Signed)
 Physical Therapy Session Note  Patient Details  Name: Theresa Watkins MRN: [id number] Date of Birth: 1966-07-21  Today's Date: 04/03/2024  Short Term Goals: Week 7:  PT Short Term Goal 1 (Week 7): pt will initiate stair navigation PT Short Term Goal 2 (Week 7): pt will initiate fall recovery training  Pt missed 45 min of skilled therapy due to reports of HA. Pt sitting EOB eating lunch that arrived late. Pt conversed with covering physician about pain. Communicated with nsg to provide medication to assist. Will re-attempt as schedule and pt availability permits. Pt R sidelying in bed after conversing with nsg with bed alarm set and nsg arriving shortly for meds.       Therapy Documentation Precautions:  Precautions Precautions: Fall Precaution/Restrictions Comments: L hemi Restrictions Weight Bearing Restrictions Per Provider Order: No   Therapy/Group: Individual Therapy  Chrsitopher Wik PTA 04/03/2024, 7:54 AM

## 2024-04-03 NOTE — Plan of Care (Signed)
  Problem: Consults Goal: RH STROKE PATIENT EDUCATION Description: See Patient Education module for education specifics  Outcome: Progressing   Problem: RH BOWEL ELIMINATION Goal: RH STG MANAGE BOWEL WITH ASSISTANCE Description: STG Manage Bowel with toileting Assistance. Outcome: Progressing Goal: RH STG MANAGE BOWEL W/MEDICATION W/ASSISTANCE Description: STG Manage Bowel with Medication with mod I Assistance. Outcome: Progressing   Problem: RH SAFETY Goal: RH STG ADHERE TO SAFETY PRECAUTIONS W/ASSISTANCE/DEVICE Description: STG Adhere to Safety Precautions With cues Assistance/Device. Outcome: Progressing   Problem: RH PAIN MANAGEMENT Goal: RH STG PAIN MANAGED AT OR BELOW PT'S PAIN GOAL Description: Pain managed < 4 with prns Outcome: Progressing   Problem: RH KNOWLEDGE DEFICIT Goal: RH STG INCREASE KNOWLEDGE OF HYPERTENSION Description: Patient and mother will be able to manage HTN using educational resources for medications and dietary modification independently Outcome: Progressing Goal: RH STG INCREASE KNOWLEDGE OF DYSPHAGIA/FLUID INTAKE Description: Patient and mother will be able to manage dysphagia using educational resources for medications and dietary modification independently Outcome: Progressing Goal: RH STG INCREASE KNOWLEGDE OF HYPERLIPIDEMIA Description: Patient and mother will be able to manage HLD using educational resources for medications and dietary modification independently Outcome: Progressing Goal: RH STG INCREASE KNOWLEDGE OF STROKE PROPHYLAXIS Description: Patient and mother will be able to manage secondary risks using educational resources for medications and dietary modification independently Outcome: Progressing   Problem: Consults Goal: RH GENERAL PATIENT EDUCATION Description: See Patient Education module for education specifics. Outcome: Progressing Goal: Skin Care Protocol Initiated - if Braden Score 18 or less Description: If consults are  not indicated, leave blank or document N/A Outcome: Progressing Goal: Nutrition Consult-if indicated Outcome: Progressing Goal: Diabetes Guidelines if Diabetic/Glucose > 140 Description: If diabetic or lab glucose is > 140 mg/dl - Initiate Diabetes/Hyperglycemia Guidelines & Document Interventions  Outcome: Progressing   Problem: RH BOWEL ELIMINATION Goal: RH STG MANAGE BOWEL WITH ASSISTANCE Description: STG Manage Bowel with Assistance. Outcome: Progressing Goal: RH STG MANAGE BOWEL W/MEDICATION W/ASSISTANCE Description: STG Manage Bowel with Medication with Assistance. Outcome: Progressing   Problem: RH BLADDER ELIMINATION Goal: RH STG MANAGE BLADDER WITH ASSISTANCE Description: STG Manage Bladder With Assistance Outcome: Progressing Goal: RH STG MANAGE BLADDER WITH MEDICATION WITH ASSISTANCE Description: STG Manage Bladder With Medication With Assistance. Outcome: Progressing Goal: RH STG MANAGE BLADDER WITH EQUIPMENT WITH ASSISTANCE Description: STG Manage Bladder With Equipment With Assistance Outcome: Progressing   Problem: RH SKIN INTEGRITY Goal: RH STG SKIN FREE OF INFECTION/BREAKDOWN Outcome: Progressing Goal: RH STG MAINTAIN SKIN INTEGRITY WITH ASSISTANCE Description: STG Maintain Skin Integrity With Assistance. Outcome: Progressing Goal: RH STG ABLE TO PERFORM INCISION/WOUND CARE W/ASSISTANCE Description: STG Able To Perform Incision/Wound Care With Assistance. Outcome: Progressing   Problem: RH SAFETY Goal: RH STG ADHERE TO SAFETY PRECAUTIONS W/ASSISTANCE/DEVICE Description: STG Adhere to Safety Precautions With Assistance/Device. Outcome: Progressing Goal: RH STG DECREASED RISK OF FALL WITH ASSISTANCE Description: STG Decreased Risk of Fall With Assistance. Outcome: Progressing   Problem: RH PAIN MANAGEMENT Goal: RH STG PAIN MANAGED AT OR BELOW PT'S PAIN GOAL Outcome: Progressing   Problem: RH KNOWLEDGE DEFICIT GENERAL Goal: RH STG INCREASE KNOWLEDGE OF  SELF CARE AFTER HOSPITALIZATION Outcome: Progressing

## 2024-04-03 NOTE — Progress Notes (Signed)
 PROGRESS NOTE   Subjective/Complaints:  Patient feels a tired/hungry has not had lunch.  Lunch tray came in as I was talking to her.  Reports she has mild swelling in her left leg, similar to prior days.  Has a headache this afternoon, has not had her Tylenol  yet, she wanted to take it with lunch.    LBM 2 days ago.    ROS:     Pt denies SOB, abd pain, CP, N/V/C/D, and vision changes  L hand pain/swelling- somewhat better + vision a little burry-unchanged from prior  Objective:   No results found.     No results for input(s): "WBC", "HGB", "HCT", "PLT" in the last 72 hours.        No results for input(s): "NA", "K", "CL", "CO2", "GLUCOSE", "BUN", "CREATININE", "CALCIUM " in the last 72 hours.         Intake/Output Summary (Last 24 hours) at 04/03/2024 1318 Last data filed at 04/03/2024 1000 Gross per 24 hour  Intake 476 ml  Output --  Net 476 ml            Physical Exam: Vital Signs Blood pressure (!) 139/97, pulse 77, temperature 98.2 F (36.8 C), resp. rate 18, height 5\' 6"  (1.676 m), weight 72.3 kg, SpO2 94%.      General: awake, alert, appropriate, NAD, laying in bed, lunch tray came in as I was in the room HENT: conjugate gaze; oropharynx moist CV: regular rate and rhythm; no JVD Pulmonary: CTA B/L; no W/R/R- good air movement GI: soft, NT, ND, (+)BS Psychiatric: appropriate Neurological: Ox3 Stable mod dysarthria- sometimes cannot understand esp in early AM MAS of 1- 1+ in L wrist and hand/fingers MSK-L hand Less TTP- less swelling- not wearing glove Neuro: cognitively at baseline. Alert. Sl dysarthria. CN without changes. Able to count fingers. MAS of 0-1 in LUE  Motor strength 0/5 LUE 3-/5 LLE 5/5 on right side  Skin- L PIP of 2nd digit- healing scab     Assessment/Plan: 1. Functional deficits which require 3+ hours per day of interdisciplinary therapy in a  comprehensive inpatient rehab setting. Physiatrist is providing close team supervision and 24 hour management of active medical problems listed below. Physiatrist and rehab team continue to assess barriers to discharge/monitor patient progress toward functional and medical goals  Care Tool:  Bathing    Body parts bathed by patient: Left arm, Chest, Abdomen, Front perineal area, Buttocks, Right upper leg, Left upper leg, Right lower leg, Left lower leg, Face   Body parts bathed by helper: Right arm, Left arm (Thourough bathing of under-arms) Body parts n/a: Right arm   Bathing assist Assist Level: Minimal Assistance - Patient > 75%     Upper Body Dressing/Undressing Upper body dressing   What is the patient wearing?: Pull over shirt    Upper body assist Assist Level: Supervision/Verbal cueing    Lower Body Dressing/Undressing Lower body dressing      What is the patient wearing?: Pants, Underwear/pull up     Lower body assist Assist for lower body dressing: Minimal Assistance - Patient > 75%     Toileting Toileting    Toileting assist Assist for  toileting: Moderate Assistance - Patient 50 - 74%     Transfers Chair/bed transfer  Transfers assist     Chair/bed transfer assist level: Minimal Assistance - Patient > 75%     Locomotion Ambulation   Ambulation assist      Assist level: Minimal Assistance - Patient > 75% Assistive device: Walker-hemi Max distance: 50   Walk 10 feet activity   Assist     Assist level: Minimal Assistance - Patient > 75% Assistive device: Walker-hemi   Walk 50 feet activity   Assist Walk 50 feet with 2 turns activity did not occur: Safety/medical concerns (L hemiplegia and pt fatigue)  Assist level: Minimal Assistance - Patient > 75% Assistive device: Walker-hemi    Walk 150 feet activity   Assist Walk 150 feet activity did not occur: Safety/medical concerns (L hemiplegia and pt fatigue)  Assist level: Minimal  Assistance - Patient > 75% Assistive device: Walker-Eva    Walk 10 feet on uneven surface  activity   Assist Walk 10 feet on uneven surfaces activity did not occur: Safety/medical concerns (L hemiplegia and pt fatigue)         Wheelchair     Assist Is the patient using a wheelchair?: Yes Type of Wheelchair: Manual    Wheelchair assist level: Supervision/Verbal cueing Max wheelchair distance: 50    Wheelchair 50 feet with 2 turns activity    Assist        Assist Level: Supervision/Verbal cueing   Wheelchair 150 feet activity     Assist      Assist Level: Minimal Assistance - Patient > 75%   Blood pressure (!) 139/97, pulse 77, temperature 98.2 F (36.8 C), resp. rate 18, height 5\' 6"  (1.676 m), weight 72.3 kg, SpO2 94%.   Medical Problem List and Plan: 1. 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 Search for facility ongoing. May only be able to go to custodial care facility  D/c SNF-  Con't CIR 2. DVT/anticoagulation:  Pharmaceutical: Lovenox              -antiplatelet therapy: Aspirin  81 mg daily and Plavix  75 mg daily x 3 weeks then Plavix  alone 3. Pain Management: Tylenol  as needed  3/25- denies pain- con't regimen rpn   3-30: Ongoing left hip pain; Voltaren  gel and Tylenol .    4/3- having new burning pain in LUE- which is seen sometimes with stroke- will add Duloxetine  30 mg daily for nerve pain- monitor for side effects.  -if gets nausea, will change to Lyrica  4/4-6 no side effects so far-  4/8- will increase Duloxetine  to 60 mg daily  DC hydrocodone  due to side effects, trial tyl #3  as needed for pain that is not relieved by Tylenol . 4/28- advised pt to use voltaren  gel  on all  sides of L hand including between fingers- use tylenol  #3 as needed 4/29- pt admits doesn't like taking/using meds all the time- explained I don't have a "fix"- to make it better- have to take meds to have them work 4/30- encouraged again to use voltaren  4x/day for L hand 5/1- pt said using voltaren  gel and tylenol  #3 as needed- adding lidoderm   patches 2 patches at night for back pain 5/2- HA's- will restart SCHEDULED Topamax  75 mg at bedtime- not sure what occurred? Needs to get nightly 5/3-4 appears improved with topamax  5/5- Will cont' topamax - hand still bothering her- not clear what type of pain- but tylenol  helps some.  5/6- will increase Duloxetine  to 120 mg daily to help with nerve pain and mood 5/8- pt reports pain so much better in last 24 hours- Duloxetine  kicking in? 5/9- hasn't asked for pain meds this Am including tylenol , so wants to not register the complaint" yet today.  5/11- Add Lyrica  50 mg BID for nerve pain in L hand 5/13- changed to q6pm since pt feels it affected her vision-made it worse 5/14- said pain better today- not clear if due to prednisone  vs Lyrica ? 5/15 pain much better in L hand-  pt feels it's gout? It might be 5/16- hand pain slightly better with ACE wrap overnight- which is fine  5/17 reports hand pain unchanged from yesterday, continue to monitor 4. Depression/Behavior/Sleep: Provide emotional support  4/21 mood improved--continue duloxetine              -antipsychotic agents: N/A 5. Neuropsych/cognition: This patient is capable of making decisions on her own behalf. 6. Skin/Wound Care: Routine skin checks 7. Fluids/Electrolytes/Nutrition:encouraging po.   5/12- I personally reviewed the patient's labs today.     -encouraging PO 8.  Dysphagia.  Dysphagia 3 with thins currently  4/18 intake ok 5/11-5/13- advised pt to take smaller bites-  intake good, but takes too big of bites    5/14- switched back to full supervision- pt unhappy, but wasn't listening  when I discussed more of how to eat/take bites 5/16- taking smaller bites with full supervision 5/17 eating 100% of her meals with supervision 9.  Hyperlipidemia.  Crestor  10.  Recent History of cocaine/tobacco use.  UDS positive for cocaine.  Provide counseling, this has been a chronic issue , seen in PCP notes 11.  Hypothyroidism. continue Synthroid   12.  Constipation.   Senokot-S 2 tab twice daily  5/6- LBM yesterday even though chart says 5/3  5/7- will add Miralax - esp since increased Duloxetine  which can cause constipation- already on Senna 2 tabs BID  5/9- LBM documented as yesterday- medium BM  5.10- LBM 2 days ago- thinks can go today  LBM 5/12  5/15- LBM yesterday  5/17 Increase miralax  to BID, add sorbitol  PRN 15. Blurry vision and dysconjugate gaze  3/27- will need Ophtho after d/c.   3-29: Seems to mostly be from the left eye, with difficulty converging and tracking; messaged OT and PT to trial taping versus alternating eye patch to help with convergence.  3-30: Reporting improvement with use of taping  4/4- pt reports everything blurry- cannot see faces as a result- never had eye checked in past- used readers but cannot see at a distance since stroke- will send to Ophtho after d/c.   4/7- d/w pt again- she wants eyeglasses- explained we cannot get when in hospital- also per guidelines, since vision can still change, to not get for 3 months after stroke. She disbelieves it's from stroke, since had 2 prior- that didn't cause vision changes- we educated pt pn how this can occur.   4/25- reminded pt cannot get Optho in hospital- need to get once d/c'd  4/30 went over visual changes with pt- will need to get glasses, likely prisms 2-3 months after d/c once vision stabilizes  5/6- d/w pt again- she wants to  see Optho- explained they do not come into hospital for this  5/17 continued blurry vision, unchanged from prior 16.  Left hip pain.  -3-29 x-ray showing moderate degenerative  joint disease of the hip; no fracture  -Continue Voltaren  gel to hip 4 times daily  -May benefit from steroid injection as an outpatient  3/31- started Vit D and Ca per pt request  - improved to an extent  4/24- Pt reports L hip still hurts when stands/works with therapy however don't want her on opiates regularly due to can impair sensorium and she also ready has issues  17. Severe HA-daily HA's  4/15-16 sx appear improved with topamax , tylenol  helps also   -may use sx to get out of therapy at times?  4/23- Per team, pt having HA's frequently which impairs therapy- she won't discuss HA's with me- unclear if only has a day goes on vs self limting behavior  4/24- increase to 75 mg at bedtime  4/25- still having HA's, actually thinks a little worse since not taking Norco for it-   4/28-4/29 fewer complaints about HA this AM  4/30-5/1- - per staff, c/o these less- not interfering with therapy as much  5/2- Wasn't getting topamax - just taking T#3- wil restart Topamax - don't know what occurred with order  5/6- HA's doing better  5/8- not requiring T#3 anymore lately per pt  5/13- Severe HA 5/11 in afternoon- took T#3  5/17 headache today does not sound severe, she has not taken her Tylenol  3 yet but nursing is getting it for her 18. Decreased safety awareness/impulsiveness  4/30- still requiring max cues  5/7- 5/10- no change-per therapy- still requiring max cues 20. Spasticity  4/22- off baclofen , and c/o LUE pain- wondering how much of her complaints are due to tone?-   4/30- Spasticity improved in LUE- MAS of 0 actually this AM  5/11-12- Getting some spasticity vs disuse tightness inL hand- fingers esp 3rd/4th DIPs- might need Botox after d/c  5/14- MAS of 1 to 1+ in L wrist and fingers- not proximally 21. 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 .   4/30- pt pulled scab  off-  22.  Urinary  and bowel incont due to CVA, cont toileting program 4/23- still cannot control B/B 4/30- on toileting program- is getting somewhat better, but still incontinent more at night  5/13- still incontinent at night  23. 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   4/29- will get Dopplers, because pt says she elevated hand overnight- still swollen- ordered  4/30- Dopplers (-)- will get isotoner compression glove  5/4 received glove  5/6- Swelling ~ 50% better- will increase Duloxetine  to 120 mg daily for nerve pain in L hand and mood.   5/7- hand sore this AM- but not "killing her".   5/8- pain great this AM- doesn't need meds other than tylenol  per pt -sounds like increased cymbalta  has helped  5/9- Some pain in L hand this Am, but hasn't taken tylenol  or T#3 yet this Am- 5/13- Will decrease Lyrica  to 50 mg q6pm since  caused her to have visual changes early in day  5/14- Pain doing better in last 12+ hours- not as painful 24. C/O catch in breath- no SOB  5/9- asked pt to use ICS every 1 hour while awake- since probably is atelectasis  25. Dry eyes  5/10- ordered eye drops as needed for pt 26. Gout? Swelling of L hand  5/13- pt has never had gout but runs in her family- she wants to try something else ot see if would help L hand- will try Colchicine  0.6 mg daily x 4 days as well as Prednisone  20 mg daily x 4 days-  5/14- pt feels pain and swelling are better- not sure if due to gout, or just prednisone  vs power of suggestion- will con't to monitor  5/15- pt feels swelling and pain is better- maybe it is gout? Vs overall improvement with prednisone  27. Vaginal candidiasis  5/15- pt reports Sx's- will give Diflucan   150 mg x1     LOS: 54 days A FACE TO FACE EVALUATION WAS PERFORMED  Theresa Watkins 04/03/2024, 1:18 PM

## 2024-04-03 NOTE — Progress Notes (Signed)
 Occupational Therapy Session Note  Patient Details  Name: Theresa Watkins MRN: [id number] Date of Birth: 1966/07/02  Today's Date: 04/03/2024 OT Individual Time: 1000-1045 OT Individual Time Calculation (min): 45 min    Short Term Goals: Week 1:  OT Short Term Goal 1 (Week 1): Pt will complete PROM with LUE without VC OT Short Term Goal 1 - Progress (Week 1): Progressing toward goal OT Short Term Goal 2 (Week 1): Pt will use LUE as stabilizer during ADL tasks with Mod A OT Short Term Goal 2 - Progress (Week 1): Met OT Short Term Goal 3 (Week 1): Pt will complete shower transfer at Mod A with LRAD OT Short Term Goal 3 - Progress (Week 1): Progressing toward goal OT Short Term Goal 4 (Week 1): Pt will compplete UB dressing with himi technique at Mod A OT Short Term Goal 4 - Progress (Week 1): Met  Skilled Therapeutic Interventions/Progress Updates:   Patient resting in bed at the time of arrival. Patient indicated that she rested well on last night with a pain response of 9 on a 0-10 for back pain. Patient was able to transfer from bed LOF to EOB with SBA.  The pt was able to come from EOB to w/c LOF with MinA to CGA using the bed and the arm of the w/c.  The pt was transported to the shower and was able to doff her over head shirt with MinA and MinA for doffing her pull-up.  The pt was able to transfer from the w.c to the shower seat using the grab bar and the arm of the w/c with close S. The pt was able to bathe UB/LB with s/uA for UB and ModI incorporating AE, a long hand sponge for LB task performance.    The pt was able to exit the shower for placement on the w/c by completing a squat pivot transfer using the arm of the w/c and the grab bar with CGA.  The pt was transported to her bed and was able to transfer from w/c LOF to EOB using the bed rail and the arm of the w/c for additional balance. The pt was able to adjust her position for completing a groom exercise for putting on deodorant and  lotion with s/uA.  The pt was MinA for donning her over head shirt and ModA for donn her brief and pants. The pt was ModA for donning her non-skid socks as well. At the end of the session, the pt remained supine in bed with all additional needs addressed.   Therapy Documentation Precautions:  Precautions Precautions: Fall Precaution/Restrictions Comments: L hemi Restrictions Weight Bearing Restrictions Per Provider Order: No   Therapy/Group: Individual Therapy  Moises Ang 04/03/2024, 10:46 AM

## 2024-04-04 ENCOUNTER — Inpatient Hospital Stay (HOSPITAL_COMMUNITY)

## 2024-04-04 DIAGNOSIS — H538 Other visual disturbances: Secondary | ICD-10-CM | POA: Diagnosis not present

## 2024-04-04 DIAGNOSIS — K5900 Constipation, unspecified: Secondary | ICD-10-CM | POA: Diagnosis not present

## 2024-04-04 DIAGNOSIS — R519 Headache, unspecified: Secondary | ICD-10-CM | POA: Diagnosis not present

## 2024-04-04 DIAGNOSIS — R32 Unspecified urinary incontinence: Secondary | ICD-10-CM

## 2024-04-04 DIAGNOSIS — I63511 Cerebral infarction due to unspecified occlusion or stenosis of right middle cerebral artery: Secondary | ICD-10-CM | POA: Diagnosis not present

## 2024-04-04 MED ORDER — SORBITOL 70 % SOLN
45.0000 mL | Freq: Once | Status: DC
Start: 2024-04-04 — End: 2024-04-04

## 2024-04-04 MED ORDER — SORBITOL 70 % SOLN
45.0000 mL | Freq: Once | Status: AC
  Administered 2024-04-04: 45 mL via ORAL
  Filled 2024-04-04: qty 60

## 2024-04-04 NOTE — Progress Notes (Signed)
 Occupational Therapy Session Note  Patient Details  Name: Theresa Watkins MRN: [id number] Date of Birth: 1965/11/24  Today's Date: 04/04/2024 OT Individual Time: 1027-2536 OT Individual Time Calculation (min): 11 min  and Today's Date: 04/04/2024 OT Missed Time: 34 Minutes Missed Time Reason: Pain;Patient fatigue (pt reporting 10/10 back pain)   Short Term Goals: Week 7:  OT Short Term Goal 1 (Week 7): Pt will perfrom toileting tasks with min A OT Short Term Goal 2 (Week 7): Pt will use LUE as gross assist during functional tasks with mod A  Skilled Therapeutic Interventions/Progress Updates:      Therapy Documentation Precautions:  Precautions Precautions: Fall Precaution/Restrictions Comments: L hemi Restrictions Weight Bearing Restrictions Per Provider Order: No General: "If I can' do it then I can't" Pt supine in bed upon OT arrival, agreeable to OT session.  Pain:  10/10 pain reported in low back, pt reported just receiving medication, activity, intermittent rest breaks, distractions provided for pain management.   Other Treatments: OT providing skilled intervention with edema management for donning edema glove to Lt hand d/t swelling. OT educated pt on purpose of edema glove and wearing schedule. OT re-iterating positioning techniques for elevation in order to decrease swelling. OT offered ADL tasks or OOB, pt declining d/t back pain. OT re-iterated importance of participating in therapy d/t insurance coverage and pain management and increased independence. Pt declining once more after education. OT will make up missed minutes as able.    Pt supine in bed with bed alarm activated, 2 bed rails up, call light within reach and 4Ps assessed.   Therapy/Group: Individual Therapy  Nila Barth, OTD, OTR/L 04/04/2024, 4:15 PM

## 2024-04-04 NOTE — Progress Notes (Addendum)
 PROGRESS NOTE   Subjective/Complaints:  Patient reports she had some abdominal pain today, did finish most of the morning checks.  She reports she had a bowel movement yesterday, last documented 5/14.  Reports continued blurred vision.    LBM 2 days ago.    ROS:     Pt denies SOB, abd pain, CP, N/V/C/D, and vision changes  L hand pain/swelling- somewhat better + vision a little burry-continued  Objective:   No results found.     No results for input(s): "WBC", "HGB", "HCT", "PLT" in the last 72 hours.        No results for input(s): "NA", "K", "CL", "CO2", "GLUCOSE", "BUN", "CREATININE", "CALCIUM " in the last 72 hours.         Intake/Output Summary (Last 24 hours) at 04/04/2024 1740 Last data filed at 04/04/2024 0926 Gross per 24 hour  Intake 472 ml  Output --  Net 472 ml            Physical Exam: Vital Signs Blood pressure (!) 150/93, pulse 68, temperature 98.2 F (36.8 C), resp. rate 18, height 5\' 6"  (1.676 m), weight 72.3 kg, SpO2 93%.      General: awake, alert, appropriate, NAD, sitting in bed, finished most of her lunch HENT: conjugate gaze; oropharynx moist CV: regular rate and rhythm; no JVD Pulmonary: CTA B/L; no W/R/R- good air movement GI: soft, NT, mild L abdomen TTP, no rebound or guarding, hypoactive bowel sounds Psychiatric: appropriate Neurological: Ox3 Stable mod dysarthria- sometimes cannot understand esp in early AM MAS of 1- 1+ in L wrist and hand/fingers MSK-L hand Less TTP- less swelling- not wearing glove Neuro: cognitively at baseline. Alert. Sl dysarthria. CN without changes. Able to count fingers. MAS of 0-1 in LUE  Motor strength 0/5 LUE 3-/5 LLE 5/5 on right side  Skin- L PIP of 2nd digit- healing scab     Assessment/Plan: 1. Functional deficits which require 3+ hours per day of interdisciplinary therapy in a comprehensive inpatient rehab  setting. Physiatrist is providing close team supervision and 24 hour management of active medical problems listed below. Physiatrist and rehab team continue to assess barriers to discharge/monitor patient progress toward functional and medical goals  Care Tool:  Bathing    Body parts bathed by patient: Left arm, Chest, Abdomen, Front perineal area, Buttocks, Right upper leg, Left upper leg, Right lower leg, Left lower leg, Face   Body parts bathed by helper: Right arm, Left arm (Thourough bathing of under-arms) Body parts n/a: Right arm   Bathing assist Assist Level: Minimal Assistance - Patient > 75%     Upper Body Dressing/Undressing Upper body dressing   What is the patient wearing?: Pull over shirt    Upper body assist Assist Level: Supervision/Verbal cueing    Lower Body Dressing/Undressing Lower body dressing      What is the patient wearing?: Pants, Underwear/pull up     Lower body assist Assist for lower body dressing: Minimal Assistance - Patient > 75%     Toileting Toileting    Toileting assist Assist for toileting: Moderate Assistance - Patient 50 - 74%     Transfers Chair/bed transfer  Transfers assist  Chair/bed transfer assist level: Minimal Assistance - Patient > 75%     Locomotion Ambulation   Ambulation assist      Assist level: Minimal Assistance - Patient > 75% Assistive device: Walker-hemi Max distance: 50   Walk 10 feet activity   Assist     Assist level: Minimal Assistance - Patient > 75% Assistive device: Walker-hemi   Walk 50 feet activity   Assist Walk 50 feet with 2 turns activity did not occur: Safety/medical concerns (L hemiplegia and pt fatigue)  Assist level: Minimal Assistance - Patient > 75% Assistive device: Walker-hemi    Walk 150 feet activity   Assist Walk 150 feet activity did not occur: Safety/medical concerns (L hemiplegia and pt fatigue)  Assist level: Minimal Assistance - Patient >  75% Assistive device: Walker-Eva    Walk 10 feet on uneven surface  activity   Assist Walk 10 feet on uneven surfaces activity did not occur: Safety/medical concerns (L hemiplegia and pt fatigue)         Wheelchair     Assist Is the patient using a wheelchair?: Yes Type of Wheelchair: Manual    Wheelchair assist level: Supervision/Verbal cueing Max wheelchair distance: 50    Wheelchair 50 feet with 2 turns activity    Assist        Assist Level: Supervision/Verbal cueing   Wheelchair 150 feet activity     Assist      Assist Level: Minimal Assistance - Patient > 75%   Blood pressure (!) 150/93, pulse 68, temperature 98.2 F (36.8 C), resp. rate 18, height 5\' 6"  (1.676 m), weight 72.3 kg, SpO2 93%.   Medical Problem List and Plan: 1. 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 Search for facility ongoing. May only be able to go to custodial care facility  D/c SNF-  Con't CIR 2. DVT/anticoagulation:  Pharmaceutical: Lovenox              -antiplatelet therapy: Aspirin  81 mg daily and Plavix  75 mg daily x 3 weeks then Plavix  alone 3. Pain Management: Tylenol  as needed  3/25- denies pain- con't regimen rpn   3-30: Ongoing left hip pain; Voltaren  gel and Tylenol .    4/3- having new burning pain in LUE- which is seen sometimes with stroke- will add Duloxetine  30 mg daily for nerve pain- monitor for side effects.  -if gets nausea, will change to Lyrica  4/4-6 no side effects so far-  4/8- will increase Duloxetine  to 60 mg daily  DC hydrocodone  due to side effects, trial tyl #3  as needed for pain that is not relieved by Tylenol . 4/28- advised pt to use voltaren  gel  on all sides of L hand  including between fingers- use tylenol  #3 as needed 4/29- pt admits doesn't like taking/using meds all the time- explained I don't have a "fix"- to make it better- have to take meds to have them work 4/30- encouraged again to use voltaren  4x/day for L hand 5/1- pt said using voltaren  gel and tylenol  #3 as needed- adding lidoderm  patches 2 patches at night for back pain 5/2- HA's- will restart SCHEDULED Topamax  75 mg at bedtime- not sure what occurred?  Needs to get nightly 5/3-4 appears improved with topamax  5/5- Will cont' topamax - hand still bothering her- not clear what type of pain- but tylenol  helps some.  5/6- will increase Duloxetine  to 120 mg daily to help with nerve pain and mood 5/8- pt reports pain so much better in last 24 hours- Duloxetine  kicking in? 5/9- hasn't asked for pain meds this Am including tylenol , so wants to not register the complaint" yet today.  5/11- Add Lyrica  50 mg BID for nerve pain in L hand 5/13- changed to q6pm since pt feels it affected her vision-made it worse 5/14- said pain better today- not clear if due to prednisone  vs Lyrica ? 5/15 pain much better in L hand-  pt feels it's gout? It might be 5/16- hand pain slightly better with ACE wrap overnight- which is fine 5/17 reports hand pain unchanged from yesterday, continue to monitor 4. Depression/Behavior/Sleep: Provide emotional support  4/21 mood improved--continue duloxetine              -antipsychotic agents: N/A 5. Neuropsych/cognition: This patient is capable of making decisions on her own behalf. 6. Skin/Wound Care: Routine skin checks 7. Fluids/Electrolytes/Nutrition:encouraging po.   5/12- I personally reviewed the patient's labs today.     -encouraging PO 8.  Dysphagia.  Dysphagia 3 with thins currently  4/18 intake ok 5/11-5/13- advised pt to take smaller bites-  intake good, but takes too big of bites    5/14- switched back to full supervision- pt unhappy, but wasn't listening when I discussed  more of how to eat/take bites 5/16- taking smaller bites with full supervision 5/18 eating 65 to 95% of her meals today 9.  Hyperlipidemia.  Crestor  10.  Recent History of cocaine/tobacco use.  UDS positive for cocaine.  Provide counseling, this has been a chronic issue , seen in PCP notes 11.  Hypothyroidism. continue Synthroid   12.  Constipation.   Senokot-S 2 tab twice daily  5/6- LBM yesterday even though chart says 5/3  5/7- will add Miralax - esp since increased Duloxetine  which can cause constipation- already on Senna 2 tabs BID  5/9- LBM documented as yesterday- medium BM  5.10- LBM 2 days ago- thinks can go today  LBM 5/12  5/15- LBM yesterday  5/17 Increase miralax  to BID, add sorbitol  PRN  5/18 appears she has been refusing miralax , will check abd xray, suspect constipation. sorbitol    15. Blurry vision and dysconjugate gaze  3/27- will need Ophtho after d/c.   3-29: Seems to mostly be from the left eye, with difficulty converging and tracking; messaged OT and PT to trial taping versus alternating eye patch to help with convergence.  3-30: Reporting improvement with use of taping  4/4- pt reports everything blurry- cannot see faces as a result- never had eye checked in past- used readers but cannot see at a distance since stroke- will send to Ophtho after d/c.   4/7- d/w pt again- she wants eyeglasses- explained we cannot get when in hospital- also per guidelines, since vision can still change, to not get for 3 months after stroke. She disbelieves it's from stroke, since had 2 prior- that didn't cause vision changes- we educated pt pn how this can occur.   4/25- reminded pt cannot get Optho in hospital- need to get once d/c'd  4/30 went over visual changes with pt- will need to get glasses, likely prisms 2-3 months after d/c once vision stabilizes  5/6- d/w pt again- she wants to see Optho- explained they do  not come into hospital for this  5/18 continued blurry vision, discussed  outpatient f/u 16.  Left hip pain.  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 Severe HA-daily HA's  4/15-16 sx appear improved with topamax , tylenol  helps also   -may use sx to get out of therapy at times?  4/23- Per team, pt having HA's frequently which impairs therapy- she won't discuss HA's with me- unclear if only has a day goes on vs self limting behavior  4/24- increase to 75 mg at bedtime  4/25- still having HA's, actually thinks a little worse since not taking Norco for it-   4/28-4/29 fewer complaints about HA this AM  4/30-5/1- - per staff, c/o these less- not interfering with therapy as much  5/2- Wasn't getting topamax - just taking T#3- wil restart Topamax - don't know what occurred with order  5/6- HA's doing better  5/8- not requiring T#3 anymore lately per pt  5/13- Severe HA 5/11 in afternoon- took T#3  5/17 headache today does not sound severe, she has not taken her Tylenol  3 yet but nursing is getting it for her  5/18 headache improved with Tylenol  No. 3 today 18. Decreased safety awareness/impulsiveness  4/30- still requiring max cues  5/7- 5/10- no change-per therapy- still requiring max cues 20. Spasticity  4/22- off baclofen , and c/o LUE pain- wondering how much of her complaints are due to tone?-   4/30- Spasticity improved in LUE- MAS of 0 actually this AM  5/11-12- Getting some spasticity vs disuse tightness inL hand- fingers esp 3rd/4th DIPs- might need Botox after d/c  5/14- MAS of 1 to 1+ in L wrist and fingers- not proximally 21. L 2nd digit PIP lesion-   4/10- needs outpt f/u with  Derm- concern of basal cell > squamous cell, but does appear slightly shiny- carcinoma based on appearance. .   4/30- pt pulled scab off-  22.  Urinary  and bowel incont due to CVA, cont toileting program 4/23- still cannot control B/B 4/30- on toileting program- is getting somewhat better, but still incontinent more at night  5/13- still incontinent at night  5/18 continued incontinence  23. 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   4/29- will get Dopplers, because pt says she elevated hand overnight- still swollen- ordered  4/30- Dopplers (-)- will get isotoner compression glove  5/4 received glove  5/6- Swelling ~ 50% better- will increase Duloxetine  to 120 mg daily for nerve pain in L hand and mood.   5/7- hand sore this AM- but not "killing her".   5/8- pain great this AM- doesn't need meds other than tylenol  per pt -sounds like increased cymbalta  has helped  5/9- Some pain in L hand this Am, but hasn't taken tylenol  or T#3 yet this Am- 5/13-  Will decrease Lyrica  to 50 mg q6pm since caused her to have visual changes early in day  5/14- Pain doing better in last 12+ hours- not as painful 24. C/O catch in breath- no SOB  5/9- asked pt to use ICS every 1 hour while awake- since probably is atelectasis  25. Dry eyes  5/10- ordered eye drops as needed for pt 26. Gout? Swelling of L hand  5/13- pt has never had gout but runs in her family- she wants to try something else ot see if would help L hand- will try Colchicine  0.6 mg daily x 4 days as well as Prednisone  20 mg daily x 4 days-  5/14- pt feels pain and swelling are better- not sure if due to gout, or just prednisone  vs power of suggestion- will con't to monitor  5/15-  pt feels swelling and pain is better- maybe it is gout? Vs overall improvement with prednisone  27. Vaginal candidiasis  5/15- pt reports Sx's- will give Diflucan  150 mg x1     LOS: 55 days A FACE TO FACE EVALUATION WAS PERFORMED  Lylia Sand 04/04/2024, 5:40 PM

## 2024-04-04 NOTE — Plan of Care (Signed)
  Problem: Consults Goal: RH STROKE PATIENT EDUCATION Description: See Patient Education module for education specifics  Outcome: Progressing   Problem: RH BOWEL ELIMINATION Goal: RH STG MANAGE BOWEL WITH ASSISTANCE Description: STG Manage Bowel with toileting Assistance. Outcome: Progressing Goal: RH STG MANAGE BOWEL W/MEDICATION W/ASSISTANCE Description: STG Manage Bowel with Medication with mod I Assistance. Outcome: Progressing   Problem: RH SAFETY Goal: RH STG ADHERE TO SAFETY PRECAUTIONS W/ASSISTANCE/DEVICE Description: STG Adhere to Safety Precautions With cues Assistance/Device. Outcome: Progressing   Problem: RH PAIN MANAGEMENT Goal: RH STG PAIN MANAGED AT OR BELOW PT'S PAIN GOAL Description: Pain managed < 4 with prns Outcome: Progressing   Problem: RH KNOWLEDGE DEFICIT Goal: RH STG INCREASE KNOWLEDGE OF HYPERTENSION Description: Patient and mother will be able to manage HTN using educational resources for medications and dietary modification independently Outcome: Progressing Goal: RH STG INCREASE KNOWLEDGE OF DYSPHAGIA/FLUID INTAKE Description: Patient and mother will be able to manage dysphagia using educational resources for medications and dietary modification independently Outcome: Progressing Goal: RH STG INCREASE KNOWLEGDE OF HYPERLIPIDEMIA Description: Patient and mother will be able to manage HLD using educational resources for medications and dietary modification independently Outcome: Progressing Goal: RH STG INCREASE KNOWLEDGE OF STROKE PROPHYLAXIS Description: Patient and mother will be able to manage secondary risks using educational resources for medications and dietary modification independently Outcome: Progressing   Problem: Consults Goal: RH GENERAL PATIENT EDUCATION Description: See Patient Education module for education specifics. Outcome: Progressing Goal: Skin Care Protocol Initiated - if Braden Score 18 or less Description: If consults are  not indicated, leave blank or document N/A Outcome: Progressing Goal: Nutrition Consult-if indicated Outcome: Progressing Goal: Diabetes Guidelines if Diabetic/Glucose > 140 Description: If diabetic or lab glucose is > 140 mg/dl - Initiate Diabetes/Hyperglycemia Guidelines & Document Interventions  Outcome: Progressing   Problem: RH BOWEL ELIMINATION Goal: RH STG MANAGE BOWEL WITH ASSISTANCE Description: STG Manage Bowel with Assistance. Outcome: Progressing Goal: RH STG MANAGE BOWEL W/MEDICATION W/ASSISTANCE Description: STG Manage Bowel with Medication with Assistance. Outcome: Progressing   Problem: RH BLADDER ELIMINATION Goal: RH STG MANAGE BLADDER WITH ASSISTANCE Description: STG Manage Bladder With Assistance Outcome: Progressing Goal: RH STG MANAGE BLADDER WITH MEDICATION WITH ASSISTANCE Description: STG Manage Bladder With Medication With Assistance. Outcome: Progressing Goal: RH STG MANAGE BLADDER WITH EQUIPMENT WITH ASSISTANCE Description: STG Manage Bladder With Equipment With Assistance Outcome: Progressing   Problem: RH SKIN INTEGRITY Goal: RH STG SKIN FREE OF INFECTION/BREAKDOWN Outcome: Progressing Goal: RH STG MAINTAIN SKIN INTEGRITY WITH ASSISTANCE Description: STG Maintain Skin Integrity With Assistance. Outcome: Progressing Goal: RH STG ABLE TO PERFORM INCISION/WOUND CARE W/ASSISTANCE Description: STG Able To Perform Incision/Wound Care With Assistance. Outcome: Progressing   Problem: RH SAFETY Goal: RH STG ADHERE TO SAFETY PRECAUTIONS W/ASSISTANCE/DEVICE Description: STG Adhere to Safety Precautions With Assistance/Device. Outcome: Progressing Goal: RH STG DECREASED RISK OF FALL WITH ASSISTANCE Description: STG Decreased Risk of Fall With Assistance. Outcome: Progressing   Problem: RH PAIN MANAGEMENT Goal: RH STG PAIN MANAGED AT OR BELOW PT'S PAIN GOAL Outcome: Progressing   Problem: RH KNOWLEDGE DEFICIT GENERAL Goal: RH STG INCREASE KNOWLEDGE OF  SELF CARE AFTER HOSPITALIZATION Outcome: Progressing

## 2024-04-04 NOTE — Progress Notes (Signed)
 Physical Therapy Note  Patient Details  Name: Theresa Watkins MRN: [id number] Date of Birth: 1966-03-10 Today's Date: 04/04/2024    Upon entering room pt stating feeling generally sore with HA and nausea. Pt refusing all intervention at this time. Will continue efforts as schedule permits.   Pt missed 30 min skilled PT due to pain/fatigue/nausea.    [name] 04/04/2024, 2:46 PM

## 2024-04-04 NOTE — Plan of Care (Signed)
  Problem: Consults Goal: RH STROKE PATIENT EDUCATION Description: See Patient Education module for education specifics  Outcome: Progressing   Problem: RH BOWEL ELIMINATION Goal: RH STG MANAGE BOWEL WITH ASSISTANCE Description: STG Manage Bowel with toileting Assistance. Outcome: Progressing Goal: RH STG MANAGE BOWEL W/MEDICATION W/ASSISTANCE Description: STG Manage Bowel with Medication with mod I  Assistance. Outcome: Progressing   Problem: RH SAFETY Goal: RH STG ADHERE TO SAFETY PRECAUTIONS W/ASSISTANCE/DEVICE Description: STG Adhere to Safety Precautions With  cues Assistance/Device. Outcome: Progressing

## 2024-04-04 NOTE — Progress Notes (Signed)
 SLP Cancellation Note  Patient Details Name: Theresa Watkins MRN: [id number] DOB: 10-17-1966   SLP attempted to see pt at scheduled ST time of 1100. Pt asleep in bed upon SLP arrival but awoke easily to verbal stimuli. Pt reported 8/10 pain in her head, back, and arm. She was pleasant but declined to participate in ST d/t her pain level. SLP spoke with pt's nurse, who reports that she also declined OT today and that she has been declining certain medications for pain. Due to scheduling changes, SLP unable to make a second attempt for tx this date.    [name], MA CCC-SLP 04/04/2024, 4:10 PM

## 2024-04-05 DIAGNOSIS — I63511 Cerebral infarction due to unspecified occlusion or stenosis of right middle cerebral artery: Secondary | ICD-10-CM | POA: Diagnosis not present

## 2024-04-05 LAB — CBC WITH DIFFERENTIAL/PLATELET
Abs Immature Granulocytes: 0.04 10*3/uL (ref 0.00–0.07)
Basophils Absolute: 0 10*3/uL (ref 0.0–0.1)
Basophils Relative: 0 %
Eosinophils Absolute: 0.2 10*3/uL (ref 0.0–0.5)
Eosinophils Relative: 3 %
HCT: 43.5 % (ref 36.0–46.0)
Hemoglobin: 14.2 g/dL (ref 12.0–15.0)
Immature Granulocytes: 1 %
Lymphocytes Relative: 27 %
Lymphs Abs: 1.7 10*3/uL (ref 0.7–4.0)
MCH: 28.4 pg (ref 26.0–34.0)
MCHC: 32.6 g/dL (ref 30.0–36.0)
MCV: 87 fL (ref 80.0–100.0)
Monocytes Absolute: 0.5 10*3/uL (ref 0.1–1.0)
Monocytes Relative: 8 %
Neutro Abs: 4.1 10*3/uL (ref 1.7–7.7)
Neutrophils Relative %: 61 %
Platelets: 179 10*3/uL (ref 150–400)
RBC: 5 MIL/uL (ref 3.87–5.11)
RDW: 13.1 % (ref 11.5–15.5)
WBC: 6.6 10*3/uL (ref 4.0–10.5)
nRBC: 0 % (ref 0.0–0.2)

## 2024-04-05 NOTE — Plan of Care (Signed)
  Problem: RH BOWEL ELIMINATION Goal: RH STG MANAGE BOWEL WITH ASSISTANCE Description: STG Manage Bowel with toileting Assistance. Outcome: Progressing Goal: RH STG MANAGE BOWEL W/MEDICATION W/ASSISTANCE Description: STG Manage Bowel with Medication with mod I Assistance. Outcome: Progressing   Problem: RH PAIN MANAGEMENT Goal: RH STG PAIN MANAGED AT OR BELOW PT'S PAIN GOAL Description: Pain managed < 4 with prns Outcome: Progressing   Problem: Consults Goal: RH GENERAL PATIENT EDUCATION Description: See Patient Education module for education specifics. Outcome: Progressing

## 2024-04-05 NOTE — Progress Notes (Signed)
 PROGRESS NOTE   Subjective/Complaints:  Pt reports so so- was up all night having BM's- so didn't sleep well. Theresa Watkins was full of "poop" and feels much better now has gotten cleaned out.   Major migraines Saturday and Sunday.   Massaging her L hand a lot-   Says doing better now.  Is now on intermittent supervision?- eating on own   ROS:    Pt denies SOB, abd pain, CP, N/V/C/D, and vision changes  L hand pain/swelling- somewhat better + vision a little burry-continued-worse when having migraines this weekend  Objective:   DG Abd 2 Views Result Date: 04/04/2024 CLINICAL DATA:  Constipation EXAM: ABDOMEN - 2 VIEW COMPARISON:  None Available. FINDINGS: The bowel gas pattern is normal. Stool burden is moderate. There is no evidence of free air. No radio-opaque calculi or other significant radiographic abnormality is seen. IMPRESSION: Nonobstructive bowel gas pattern. Moderate stool burden. Electronically Signed   By: Tyron Gallon M.D.   On: 04/04/2024 19:07       Recent Labs    04/05/24 0557  WBC 6.6  HGB 14.2  HCT 43.5  PLT 179          No results for input(s): "NA", "K", "CL", "CO2", "GLUCOSE", "BUN", "CREATININE", "CALCIUM " in the last 72 hours.         Intake/Output Summary (Last 24 hours) at 04/05/2024 1032 Last data filed at 04/05/2024 0738 Gross per 24 hour  Intake 533 ml  Output --  Net 533 ml            Physical Exam: Vital Signs Blood pressure 133/86, pulse 73, temperature 98 F (36.7 C), resp. rate 18, height 5\' 6"  (1.676 m), weight 72.3 kg, SpO2 98%.      General: awake, alert, appropriate, eating alone;  NAD HENT: conjugate gaze; oropharynx moist CV: regular rate and rhythm; no JVD Pulmonary: CTA B/L; no W/R/R- good air movement GI: soft, NT, ND, (+)BS Psychiatric: appropriate- brighter affect Neurological: Ox3 Stable mod dysarthria- sometimes cannot understand esp in  early AM- still- did stop eating to talk to me! MAS of 1- 1+ in L wrist and hand/fingers MSK-L hand Less TTP- less swelling- not wearing glove Neuro: cognitively at baseline. Alert. Sl dysarthria. CN without changes. Able to count fingers. MAS of 0-1 in LUE Extremities: L hand  Motor strength 0/5 LUE 3-/5 LLE 5/5 on right side  Skin- L PIP of 2nd digit- healing scab     Assessment/Plan: 1. Functional deficits which require 3+ hours per day of interdisciplinary therapy in a comprehensive inpatient rehab setting. Physiatrist is providing close team supervision and 24 hour management of active medical problems listed below. Physiatrist and rehab team continue to assess barriers to discharge/monitor patient progress toward functional and medical goals  Care Tool:  Bathing    Body parts bathed by patient: Left arm, Chest, Abdomen, Front perineal area, Buttocks, Right upper leg, Left upper leg, Right lower leg, Left lower leg, Face   Body parts bathed by helper: Right arm, Left arm (Thourough bathing of under-arms) Body parts n/a: Right arm   Bathing assist Assist Level: Minimal Assistance - Patient > 75%  Upper Body Dressing/Undressing Upper body dressing   What is the patient wearing?: Pull over shirt    Upper body assist Assist Level: Supervision/Verbal cueing    Lower Body Dressing/Undressing Lower body dressing      What is the patient wearing?: Pants, Underwear/pull up     Lower body assist Assist for lower body dressing: Minimal Assistance - Patient > 75%     Toileting Toileting    Toileting assist Assist for toileting: Moderate Assistance - Patient 50 - 74%     Transfers Chair/bed transfer  Transfers assist     Chair/bed transfer assist level: Minimal Assistance - Patient > 75%     Locomotion Ambulation   Ambulation assist      Assist level: Minimal Assistance - Patient > 75% Assistive device: Walker-hemi Max distance: 50   Walk 10 feet  activity   Assist     Assist level: Minimal Assistance - Patient > 75% Assistive device: Walker-hemi   Walk 50 feet activity   Assist Walk 50 feet with 2 turns activity did not occur: Safety/medical concerns (L hemiplegia and pt fatigue)  Assist level: Minimal Assistance - Patient > 75% Assistive device: Walker-hemi    Walk 150 feet activity   Assist Walk 150 feet activity did not occur: Safety/medical concerns (L hemiplegia and pt fatigue)  Assist level: Minimal Assistance - Patient > 75% Assistive device: Walker-Eva    Walk 10 feet on uneven surface  activity   Assist Walk 10 feet on uneven surfaces activity did not occur: Safety/medical concerns (L hemiplegia and pt fatigue)         Wheelchair     Assist Is the patient using a wheelchair?: Yes Type of Wheelchair: Manual    Wheelchair assist level: Supervision/Verbal cueing Max wheelchair distance: 50    Wheelchair 50 feet with 2 turns activity    Assist        Assist Level: Supervision/Verbal cueing   Wheelchair 150 feet activity     Assist      Assist Level: Minimal Assistance - Patient > 75%   Blood pressure 133/86, pulse 73, temperature 98 F (36.7 C), resp. rate 18, height 5\' 6"  (1.676 m), weight 72.3 kg, SpO2 98%.   Medical Problem List and Plan: 1. 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 Search for facility ongoing. May only be able to go to custodial care facility  D/c SNF-  Con't CIR-  2. DVT/anticoagulation:  Pharmaceutical: Lovenox              -antiplatelet therapy: Aspirin  81 mg daily and Plavix  75 mg daily x 3 weeks then Plavix  alone 3. Pain Management: Tylenol  as needed  3/25- denies pain- con't  regimen rpn   3-30: Ongoing left hip pain; Voltaren  gel and Tylenol .    4/3- having new burning pain in LUE- which is seen sometimes with stroke- will add Duloxetine  30 mg daily for nerve pain- monitor for side effects.  -if gets nausea, will change to Lyrica  4/4-6 no side effects so far-  4/8- will increase Duloxetine  to 60 mg daily  DC hydrocodone  due to side effects, trial tyl #3  as needed for pain  that is not relieved by Tylenol . 4/28- advised pt to use voltaren  gel  on all sides of L hand including between fingers- use tylenol  #3 as needed 4/29- pt admits doesn't like taking/using meds all the time- explained I don't have a "fix"- to make it better- have to take meds to have them work 4/30- encouraged again to use voltaren  4x/day for L hand 5/1- pt said using voltaren  gel and tylenol  #3 as needed- adding lidoderm  patches 2 patches at night for back pain 5/2- HA's- will restart SCHEDULED Topamax  75 mg at bedtime- not sure what occurred? Needs to get nightly 5/3-4 appears improved with topamax  5/5- Will cont' topamax - hand still bothering her- not clear what type of pain- but tylenol  helps some.  5/6- will increase Duloxetine  to 120 mg daily to help with nerve pain and mood 5/8- pt reports pain so much better in last 24 hours- Duloxetine  kicking in? 5/9- hasn't asked for pain meds this Am including tylenol , so wants to not register the complaint" yet today.  5/11- Add Lyrica  50 mg BID for nerve pain in L hand 5/13- changed to q6pm since pt feels it affected her vision-made it worse 5/14- said pain better today- not clear if due to prednisone  vs Lyrica ? 5/15 pain much better in L hand-  pt feels it's gout? It might be 5/16- hand pain slightly better with ACE wrap overnight- which is fine 5/17 reports hand pain unchanged from yesterday, continue to monitor 5/19- pain is unchanged-from weekend, but is better since last week 4. Depression/Behavior/Sleep: Provide emotional support  4/21 mood  improved--continue duloxetine              -antipsychotic agents: N/A 5. Neuropsych/cognition: This patient is capable of making decisions on her own behalf. 6. Skin/Wound Care: Routine skin checks 7. Fluids/Electrolytes/Nutrition:encouraging po.   5/12- I personally reviewed the patient's labs today.     -encouraging PO 8.  Dysphagia.  Dysphagia 3 with thins currently  4/18 intake ok 5/11-5/13- advised pt to take smaller bites-  intake good, but takes too big of bites    5/14- switched back to full supervision- pt unhappy, but wasn't listening when I discussed more of how to eat/take bites 5/16- taking smaller bites with full supervision 5/18 eating 65 to 95% of her meals today 5/19- back to being intermittent supervision- eating big bites, but stopped when spoke to me! 9.  Hyperlipidemia.  Crestor  10.  Recent History of cocaine/tobacco use.  UDS positive for cocaine.  Provide counseling, this has been a chronic issue , seen in PCP notes 11.  Hypothyroidism. continue Synthroid   12.  Constipation.   Senokot-S 2 tab twice daily  5/6- LBM yesterday even though chart says 5/3  5/7- will add Miralax - esp since increased Duloxetine  which can cause constipation- already on Senna 2 tabs BID  5/9- LBM documented as yesterday- medium BM  5.10- LBM 2 days ago- thinks can go today  LBM 5/12  5/15- LBM yesterday  5/17 Increase miralax  to BID, add sorbitol  PRN  5/18 appears she has been refusing miralax , will check abd xray, suspect constipation. sorbitol   5/19- up all night having Bms, but cleaned out 15. Blurry vision and dysconjugate gaze  3/27- will need Ophtho after d/c.   3-29: Seems to mostly be from the left eye, with difficulty converging and tracking; messaged OT and PT to trial taping versus alternating eye patch to help with convergence.  3-30: Reporting improvement with use of taping  4/4- pt  reports everything blurry- cannot see faces as a result- never had eye checked in past- used  readers but cannot see at a distance since stroke- will send to Ophtho after d/c.   4/7- d/w pt again- she wants eyeglasses- explained we cannot get when in hospital- also per guidelines, since vision can still change, to not get for 3 months after stroke. She disbelieves it's from stroke, since had 2 prior- that didn't cause vision changes- we educated pt pn how this can occur.   4/25- reminded pt cannot get Optho in hospital- need to get once d/c'd  4/30 went over visual changes with pt- will need to get glasses, likely prisms 2-3 months after d/c once vision stabilizes  5/6- d/w pt again- she wants to see Optho- explained they do not come into hospital for this  5/18 continued blurry vision, discussed outpatient f/u 16.  Left hip pain.  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 Severe HA-daily HA's  4/15-16 sx appear improved with topamax , tylenol  helps also   -may use sx to get out of therapy at times?  4/23- Per team, pt having HA's frequently which impairs therapy- she won't discuss HA's with me- unclear if only has a day goes on vs self limting behavior  4/24- increase to 75 mg at bedtime  4/25- still having HA's, actually thinks a little worse since not taking Norco for it-   4/28-4/29 fewer complaints about HA this AM  4/30-5/1- - per staff, c/o these less- not interfering with therapy as much  5/2- Wasn't getting topamax - just taking T#3- wil restart Topamax - don't know what occurred with order  5/6- HA's doing better  5/8- not requiring T#3 anymore lately per pt  5/13- Severe HA 5/11 in afternoon- took  T#3  5/17 headache today does not sound severe, she has not taken her Tylenol  3 yet but nursing is getting it for her  5/18 headache improved with Tylenol  No. 3 today 18. Decreased safety awareness/impulsiveness  4/30- still requiring max cues  5/7- 5/10- no change-per therapy- still requiring max cues 20. Spasticity  4/22- off baclofen , and c/o LUE pain- wondering how much of her complaints are due to tone?-   4/30- Spasticity improved in LUE- MAS of 0 actually this AM  5/11-12- Getting some spasticity vs disuse tightness inL hand- fingers esp 3rd/4th DIPs- might need Botox after d/c  5/14- MAS of 1 to 1+ in L wrist and fingers- not proximally 21. 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 .   4/30- pt pulled scab off-  22.  Urinary  and bowel incont due to CVA, cont toileting program 4/23- still cannot control B/B 4/30- on toileting program- is getting somewhat better, but still incontinent more at night  5/13- still incontinent at night  5/18 continued incontinence  23. 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   4/29- will get Dopplers, because pt says she elevated hand overnight- still swollen- ordered  4/30- Dopplers (-)- will get isotoner compression glove  5/4 received glove  5/6- Swelling ~ 50% better- will increase Duloxetine  to 120 mg daily for nerve pain in L hand and mood.   5/7- hand sore this AM- but not "killing her".   5/8- pain great this AM- doesn't need meds other than tylenol  per pt -sounds like increased cymbalta  has helped  5/9- Some pain in L hand this Am, but hasn't taken tylenol  or  T#3 yet this Am- 5/13- Will decrease Lyrica  to 50 mg q6pm since caused her to have visual changes early in day  5/14- Pain doing better in last 12+ hours- not as painful 24. C/O catch in breath- no SOB  5/9- asked pt to use ICS every 1 hour while awake- since probably is atelectasis  25. Dry eyes  5/10- ordered eye drops as needed for pt 26. Gout? Swelling of L hand  5/13- pt has never had gout but runs in her family- she wants to try something else ot see if would help L hand- will try Colchicine  0.6 mg daily x 4 days as well as Prednisone  20 mg daily x 4 days-  5/14- pt feels pain and swelling are better- not sure if due to gout, or just prednisone  vs power of suggestion- will con't to monitor  5/15- pt feels swelling and pain is better- maybe it is gout? Vs overall improvement with prednisone  27. Vaginal candidiasis  5/15- pt reports Sx's- will give Diflucan  150 mg x1     LOS: 56 days A FACE TO FACE EVALUATION WAS PERFORMED  Theresa Watkins 04/05/2024, 10:32 AM

## 2024-04-05 NOTE — Progress Notes (Signed)
   04/05/24 2338  Provider Notification  Provider Name/Title Lamon Pillow, PA  Date Provider Notified 04/05/24  Time Provider Notified 2338  Method of Notification Call  Notification Reason Red med refusal  Provider response No new orders  Date of Provider Response 04/05/24  Time of Provider Response 2338

## 2024-04-05 NOTE — Progress Notes (Signed)
 Physical Therapy Weekly Progress Note  Patient Details  Name: Theresa Watkins MRN: [id number] Date of Birth: November 13, 1966  Beginning of progress report period: Mar 29, 2024 End of progress report period: Apr 05, 2024  Today's Date: 04/05/2024 PT Missed Time: 60 Minutes Missed Time Reason: Patient fatigue;Patient ill (Comment) (pt having loose stools, concerned about urgency)  Patient has met 1 of 2 short term goals.  [name] is making slow but steady progress with transfers and gait. She still requires min a with some transfers d/t safety awareness and min a for gait with hemi walker. She demoes improved in functional use of LLE, as shown by increasing gait distances and recent fall recovery training. Continues to be limited by pain and fatigue at times.  Patient continues to demonstrate the following deficits muscle weakness, decreased cardiorespiratoy endurance, impaired timing and sequencing, unbalanced muscle activation, and decreased coordination, and decreased sitting balance, decreased standing balance, hemiplegia, and decreased balance strategies and therefore will continue to benefit from skilled PT intervention to increase functional independence with mobility.  Patient progressing toward long term goals..  Continue plan of care.  PT Short Term Goals Week 7:  PT Short Term Goal 1 (Week 7): pt will initiate stair navigation PT Short Term Goal 1 - Progress (Week 7): Not met PT Short Term Goal 2 (Week 7): pt will initiate fall recovery training PT Short Term Goal 2 - Progress (Week 7): Met Week 8: PT Short Term Goal 1 (Week 8): Pt will initiate stair training PT Short Term Goal 2 (Week 8): Pt will perform floor transfer with CGA or better  Skilled Therapeutic Interventions/Progress Updates:  Pt recd in bed, reports she is having loose stools throughout the day and does not feel well enough to participate at this time. Therapist returned later in the afternoon to attempt session  again, but pt repeatedly declined, despite education on importance of therapy and need to participate for insurance coverage. Provided options for toileting, bed level therapy, etc but pt continues to politely decline. Pt missed x 60 min of scheduled therapy, will make up as able.   Therapy Documentation Precautions:  Precautions Precautions: Fall Precaution/Restrictions Comments: L hemi Restrictions Weight Bearing Restrictions Per Provider Order: No General: PT Amount of Missed Time (min): 60 Minutes PT Missed Treatment Reason: Patient fatigue;Patient ill (Comment) (pt having loose stools, concerned about urgency)    Therapy/Group: Individual Therapy  Tex Filbert 04/05/2024, 3:15 PM

## 2024-04-05 NOTE — Progress Notes (Addendum)
 Occupational Therapy Session Note  Patient Details  Name: Theresa Watkins MRN: [id number] Date of Birth: Aug 24, 1966  Today's Date: 04/05/2024 OT Individual Time: 0815 - 0830 15 min    and Today's Date: 04/05/2024 OT Missed Time: 60 Minutes Missed Time Reason: Other (comment);Patient fatigue (lethargy from medications)   Short Term Goals: Week 7:  OT Short Term Goal 1 (Week 7): Pt will perfrom toileting tasks with min A OT Short Term Goal 2 (Week 7): Pt will use LUE as gross assist during functional tasks with mod A  Skilled Therapeutic Interventions/Progress Updates:    Pt sleeping in bed upon arrival. Easily aroused. Pt with increased lethargy this morning with increased dysarthria. Pt reports "they did it to me again" referring to medications making her lethargic. Focus on bed mobility and positioning in bed. Pt requires min A for positioning. LUE AAROM/AROM/PROM. Pt unable to keep eyes open. Pt agreeable to taking shower tomorrow morning. Pt remained in bed with all needs within reach. Bed alarm activated. Pt missed 60 mins skilled OT services. Will attempt to see pt again as schedule allows.   Therapy Documentation Precautions:  Precautions Precautions: Fall Precaution/Restrictions Comments: L hemi Restrictions Weight Bearing Restrictions Per Provider Order: No General: General OT Amount of Missed Time: 60 Minutes Pain:  Pt reports LUE "feeling better" with reduced edema  Therapy/Group: Individual Therapy  [name] 04/05/2024, 8:46 AM

## 2024-04-06 DIAGNOSIS — I63511 Cerebral infarction due to unspecified occlusion or stenosis of right middle cerebral artery: Secondary | ICD-10-CM | POA: Diagnosis not present

## 2024-04-06 LAB — COMPREHENSIVE METABOLIC PANEL WITH GFR
ALT: 40 U/L (ref 0–44)
AST: 15 U/L (ref 15–41)
Albumin: 3.3 g/dL — ABNORMAL LOW (ref 3.5–5.0)
Alkaline Phosphatase: 45 U/L (ref 38–126)
Anion gap: 10 (ref 5–15)
BUN: 14 mg/dL (ref 6–20)
CO2: 23 mmol/L (ref 22–32)
Calcium: 8.7 mg/dL — ABNORMAL LOW (ref 8.9–10.3)
Chloride: 106 mmol/L (ref 98–111)
Creatinine, Ser: 0.78 mg/dL (ref 0.44–1.00)
GFR, Estimated: >60 mL/min (ref >60)
Glucose, Bld: 93 mg/dL (ref 70–99)
Potassium: 4.2 mmol/L (ref 3.5–5.1)
Sodium: 139 mmol/L (ref 135–145)
Total Bilirubin: 0.3 mg/dL (ref 0.0–1.2)
Total Protein: 6 g/dL — ABNORMAL LOW (ref 6.5–8.1)

## 2024-04-06 NOTE — Progress Notes (Signed)
 Speech Language Pathology Daily Session Note  Patient Details  Name: Theresa Watkins MRN: [id number] Date of Birth: 02-04-1966  Today's Date: 04/06/2024 SLP Individual Time: 1130-1200 SLP Individual Time Calculation (min): 30 min  Short Term Goals: Week 8: SLP Short Term Goal 1 (Week 8): Patient will communicate complex information with 90% intelligibility and sup A SLP Short Term Goal 2 (Week 8): Patient will utilize swallowing compensatory strategies twith sup A in order to decrease risk of aspiration of regular solids and thin liquids/  Skilled Therapeutic Interventions: Skilled therapy session focused on communication goals. SLP facilitated session by providing supervision A for use of compensatory strategies to increase speech intelligibility during conversation. SLP prompted patient to participate in "job interview" to practice use of strategies during functional conversation. Patient was 90% intelligibile at the conversational level. At the end of the session, patient completed x20 repetitions of EMST (expiratory muscle strength training ) set at 40 cm H2O. Patient left in bed with alarm set and call bell in reach. Continue POC.    Pain None reported   Therapy/Group: Individual Therapy  Kejuan Bekker M.A., CCC-SLP 04/06/2024, 7:46 AM

## 2024-04-06 NOTE — Progress Notes (Signed)
 Patient ID: Theresa Watkins, female   DOB: [birth year], 58 y.o.   MRN: [id number] Met with the patient to review team conference report.  Reviewed discussion of full supervision with meals and should be seated for meals in a chair/wheelchair. Discussed decreased left hand edema and pain and note of incorporating in self care. Discussed report of refusal of medication and HA every morning since; patient noted she had declined the med as it made her feel "loopy"; MD to adjust dose.  Reviewed SW continues to work on discharge disposition and plan. Patient acknowledged an understanding of information reviewed. Naoma Bacca

## 2024-04-06 NOTE — Progress Notes (Signed)
 PROGRESS NOTE   Subjective/Complaints:  Pt reports having HA's every AM- but it appears based on chart, has refused meds/topamax  for at least the past 3 days!  Also refusing voltaren  gel and miralax .   Ate 75% breakfast   ROS:    Pt denies SOB, abd pain, CP, N/V/C/D  L hand pain/swelling- somewhat better + vision a little burry-continued-worse when having migraines this weekend  Objective:   DG Abd 2 Views Result Date: 04/04/2024 CLINICAL DATA:  Constipation EXAM: ABDOMEN - 2 VIEW COMPARISON:  None Available. FINDINGS: The bowel gas pattern is normal. Stool burden is moderate. There is no evidence of free air. No radio-opaque calculi or other significant radiographic abnormality is seen. IMPRESSION: Nonobstructive bowel gas pattern. Moderate stool burden. Electronically Signed   By: Tyron Gallon M.D.   On: 04/04/2024 19:07       Recent Labs    04/05/24 0557  WBC 6.6  HGB 14.2  HCT 43.5  PLT 179          Recent Labs    04/06/24 0559  NA 139  K 4.2  CL 106  CO2 23  GLUCOSE 93  BUN 14  CREATININE 0.78  CALCIUM  8.7*           Intake/Output Summary (Last 24 hours) at 04/06/2024 2956 Last data filed at 04/06/2024 0824 Gross per 24 hour  Intake 360 ml  Output --  Net 360 ml            Physical Exam: Vital Signs Blood pressure 125/79, pulse 76, temperature 98.1 F (36.7 C), resp. rate 18, height 5\' 6"  (1.676 m), weight 72.3 kg, SpO2 92%.       General: awake, alert, appropriate,  sitting up in bed; ate 75% of meal;  NAD HENT: conjugate gaze; oropharynx moist CV: regular rate  and rhythm; no JVD Pulmonary: CTA B/L; no W/R/R- good air movement GI: soft, NT, ND, (+)BS- normoactive Psychiatric: appropriate, brighter affect Neurological: Ox3 C/o HA again this AM Stable mod dysarthria- sometimes cannot understand esp in early AM- still- did stop eating to talk to me! MAS of 1- 1+  in L wrist and hand/fingers MSK-L hand Less TTP- less swelling- not wearing glove Neuro: cognitively at baseline. Alert. Sl dysarthria. CN without changes. Able to count fingers. MAS of 0-1 in LUE Extremities: L hand  Motor strength 0/5 LUE 3-/5 LLE 5/5 on right side  Skin- L PIP of 2nd digit- healing scab     Assessment/Plan: 1. Functional deficits which require 3+ hours per day of interdisciplinary therapy in a comprehensive inpatient rehab setting. Physiatrist is providing close team supervision and 24 hour management of active medical problems listed below. Physiatrist and rehab team continue to assess barriers to discharge/monitor patient progress toward functional and medical goals  Care Tool:  Bathing    Body parts bathed by patient: Left arm, Chest, Abdomen, Front perineal area, Buttocks, Right upper leg, Left upper leg, Right lower leg, Left lower leg, Face   Body parts bathed by helper: Right arm, Left arm (Thourough bathing of under-arms) Body parts n/a: Right arm   Bathing assist Assist Level: Minimal Assistance - Patient > 75%  Upper Body Dressing/Undressing Upper body dressing   What is the patient wearing?: Pull over shirt    Upper body assist Assist Level: Supervision/Verbal cueing    Lower Body Dressing/Undressing Lower body dressing      What is the patient wearing?: Pants, Underwear/pull up     Lower body assist Assist for lower body dressing: Minimal Assistance - Patient > 75%     Toileting Toileting    Toileting assist Assist for toileting: Moderate Assistance - Patient 50 - 74%     Transfers Chair/bed transfer  Transfers assist     Chair/bed transfer assist level: Minimal Assistance - Patient > 75%     Locomotion Ambulation   Ambulation assist      Assist level: Minimal Assistance - Patient > 75% Assistive device: Walker-hemi Max distance: 50   Walk 10 feet activity   Assist     Assist level: Minimal Assistance -  Patient > 75% Assistive device: Walker-hemi   Walk 50 feet activity   Assist Walk 50 feet with 2 turns activity did not occur: Safety/medical concerns (L hemiplegia and pt fatigue)  Assist level: Minimal Assistance - Patient > 75% Assistive device: Walker-hemi    Walk 150 feet activity   Assist Walk 150 feet activity did not occur: Safety/medical concerns (L hemiplegia and pt fatigue)  Assist level: Minimal Assistance - Patient > 75% Assistive device: Walker-Eva    Walk 10 feet on uneven surface  activity   Assist Walk 10 feet on uneven surfaces activity did not occur: Safety/medical concerns (L hemiplegia and pt fatigue)         Wheelchair     Assist Is the patient using a wheelchair?: Yes Type of Wheelchair: Manual    Wheelchair assist level: Supervision/Verbal cueing Max wheelchair distance: 50    Wheelchair 50 feet with 2 turns activity    Assist        Assist Level: Supervision/Verbal cueing   Wheelchair 150 feet activity     Assist      Assist Level: Minimal Assistance - Patient > 75%   Blood pressure 125/79, pulse 76, temperature 98.1 F (36.7 C), resp. rate 18, height 5\' 6"  (1.676 m), weight 72.3 kg, SpO2 92%.   Medical Problem List and Plan: 1. 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 Search for facility ongoing. May only be able to go to custodial care facility  D/c SNF-  Con't CIR   Team conference today to f/u on progress and dispo 2. DVT/anticoagulation:  Pharmaceutical: Lovenox              -antiplatelet therapy: Aspirin  81 mg daily and Plavix  75 mg daily x 3 weeks then Plavix  alone 3. Pain Management: Tylenol  as needed  3/25- denies pain- con't regimen rpn    3-30: Ongoing left hip pain; Voltaren  gel and Tylenol .  4/3- having new burning pain in LUE- which is seen sometimes with stroke- will add Duloxetine  30 mg daily for nerve pain- monitor for side effects.  -if gets nausea, will change to Lyrica  4/4-6 no side effects so far-  4/8- will increase Duloxetine  to 60 mg daily  DC hydrocodone  due to side effects,  trial tyl #3  as needed for pain that is not relieved by Tylenol . 4/28- advised pt to use voltaren  gel  on all sides of L hand including between fingers- use tylenol  #3 as needed 4/29- pt admits doesn't like taking/using meds all the time- explained I don't have a "fix"- to make it better- have to take meds to have them work 4/30- encouraged again to use voltaren  4x/day for L hand 5/1- pt said using voltaren  gel and tylenol  #3 as needed- adding lidoderm  patches 2 patches at night for back pain 5/2- HA's- will restart SCHEDULED Topamax  75 mg at bedtime- not sure what occurred? Needs to get nightly 5/9- hasn't asked for pain meds this Am including tylenol , so wants to not register the complaint" yet today.  5/11- Add Lyrica  50 mg BID for nerve pain in L hand 5/13- changed to q6pm since pt feels it affected her vision-made it worse 5/14- said pain better today- not clear if due to prednisone  vs Lyrica ? 5/15 pain much better in L hand-  pt feels it's gout? It might be 5/16- hand pain slightly better with ACE wrap overnight- which is fine 5/20- pt reports having migraines every AM, but refusing topamax  last few days- at least 3- will go back and d/w pt and NOT make changes as we discussed 4. Depression/Behavior/Sleep: Provide emotional support  4/21 mood improved--continue duloxetine              -antipsychotic agents: N/A 5. Neuropsych/cognition: This patient is capable of making decisions on her own behalf. 6. Skin/Wound Care: Routine skin checks 7. Fluids/Electrolytes/Nutrition:encouraging po.   5/12- I personally reviewed the patient's labs  today.     -encouraging PO 8.  Dysphagia.  Dysphagia 3 with thins currently  4/18 intake ok 5/11-5/13- advised pt to take smaller bites-  intake good, but takes too big of bites    5/14- switched back to full supervision- pt unhappy, but wasn't listening when I discussed more of how to eat/take bites 5/16- taking smaller bites with full supervision 5/18 eating 65 to 95% of her meals today 5/19- back to being intermittent supervision- eating big bites, but stopped when spoke to me! 9.  Hyperlipidemia.  Crestor  10.  Recent History of cocaine/tobacco use.  UDS positive for cocaine.  Provide counseling, this has been a chronic issue , seen in PCP notes 11.  Hypothyroidism. continue Synthroid   12.  Constipation.   Senokot-S 2 tab twice daily  5/6- LBM yesterday even though chart says 5/3  5/7- will add Miralax - esp since increased Duloxetine  which can cause constipation- already on Senna 2 tabs BID  5/9- LBM documented as yesterday- medium BM  5.10- LBM 2 days ago- thinks can go today  LBM 5/12  5/15- LBM yesterday  5/17 Increase miralax  to BID, add sorbitol  PRN  5/18 appears she has been refusing miralax , will check abd xray, suspect constipation. sorbitol   5/19- up all night having Bms, but cleaned out  5/20- LBM yesterday 15. Blurry vision and dysconjugate gaze  3/27- will need Ophtho after d/c.   4/4- pt reports everything blurry- cannot see faces as a result- never had eye checked in past- used readers but cannot see at a distance since stroke- will send to Ophtho after d/c.   4/7- d/w pt again- she wants eyeglasses- explained we cannot get when in hospital- also per guidelines, since vision can still change, to not get for 3 months after stroke. She disbelieves it's from stroke, since had 2  prior- that didn't cause vision changes- we educated pt pn how this can occur.   4/25- reminded pt cannot get Optho in hospital- need to get once d/c'd  4/30 went over visual changes with pt- will  need to get glasses, likely prisms 2-3 months after d/c once vision stabilizes  5/6- d/w pt again- she wants to see Optho- explained they do not come into hospital for this  5/18 continued blurry vision, discussed outpatient f/u 16.  Left hip pain.  -3-29 x-ray showing moderate degenerative joint disease of the hip; no fracture  -Continue Voltaren  gel to hip 4 times daily  -May benefit from steroid injection as an outpatient  3/31- started Vit D and Ca per pt request  - improved to an extent  4/24- Pt reports L hip still hurts when stands/works with therapy however don't want her on opiates regularly due to can impair sensorium and she also ready has issues  5/20- pt refusing voltaren  gel 17. Severe HA-daily HA's  4/15-16 sx appear improved with topamax , tylenol  helps also   -may use sx to get out of therapy at times?  4/23- Per team, pt having HA's frequently which impairs therapy- she won't discuss HA's with me- unclear if only has a day goes on vs self limting behavior  4/24- increase to 75 mg at bedtime  4/25- still having HA's, actually thinks a little worse since not taking Norco for it-   4/28-4/29 fewer complaints about HA this AM  4/30-5/1- - per staff, c/o these less- not interfering with therapy as much  5/2- Wasn't getting topamax - just taking T#3- wil restart Topamax - don't know what occurred with order  5/6- HA's doing better  5/8- not requiring T#3 anymore lately per pt  5/13- Severe HA 5/11 in afternoon- took T#3  5/17 headache today does not sound severe, she has not taken her Tylenol  3 yet but nursing is getting it for her  5/18 headache improved with Tylenol  No. 3 today  5/20- refusing topamax - for past 3+ days- will go back and d/w pt 18. Decreased safety awareness/impulsiveness  4/30- still requiring max cues  5/7- 5/10- no change-per therapy- still requiring max cues 20. Spasticity  4/22- off baclofen , and c/o LUE pain- wondering how much of her complaints are due  to tone?-   4/30- Spasticity improved in LUE- MAS of 0 actually this AM  5/11-12- Getting some spasticity vs disuse tightness inL hand- fingers esp 3rd/4th DIPs- might need Botox after d/c  5/14- MAS of 1 to 1+ in L wrist and fingers- not proximally 21. 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 .   4/30- pt pulled scab off-  22.  Urinary  and bowel incont due to CVA, cont toileting program 4/23- still cannot control B/B 4/30- on toileting program- is getting somewhat better, but still incontinent more at night  5/13- still incontinent at night  5/18 continued incontinence  23. 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   4/29- will get Dopplers, because pt says she elevated hand overnight- still swollen- ordered  4/30-  Dopplers (-)- will get isotoner compression glove  5/4 received glove  5/6- Swelling ~ 50% better- will increase Duloxetine  to 120 mg daily for nerve pain in L hand and mood.   5/7- hand sore this AM- but not "killing her".   5/8- pain great this AM- doesn't need meds other than tylenol  per pt -sounds like increased cymbalta  has helped  5/9- Some pain in L hand this Am, but hasn't taken tylenol  or T#3 yet this Am- 5/13- Will decrease Lyrica  to 50 mg q6pm since caused her to have visual changes early in day  5/14- Pain doing better in last 12+ hours- not as painful 24. C/O catch in breath- no SOB  5/9- asked pt to use ICS every 1 hour while awake- since probably is atelectasis  25. Dry eyes  5/10- ordered eye drops as needed for pt 26. Gout? Swelling of L hand  5/13- pt has never had gout but  runs in her family- she wants to try something else ot see if would help L hand- will try Colchicine  0.6 mg daily x 4 days as well as Prednisone  20 mg daily x 4 days-  5/14- pt feels pain and swelling are better- not sure if due to gout, or just prednisone  vs power of suggestion- will con't to monitor  5/15- pt feels swelling and pain is better- maybe it is gout? Vs overall improvement with prednisone  27. Vaginal candidiasis  5/15- pt reports Sx's- will give Diflucan  150 mg x1  I spent a total of  52  minutes on total care today- >50% coordination of care- due to  Pt seen twice- and discussed with her first time about changing meds for HA"s, but found out she's refusing Topamax  for 3+ days- will go back and d/w her- also spoke wit nursing about pt And doing team conference to f/u on progress   LOS: 57 days A FACE TO FACE EVALUATION WAS PERFORMED  Leeana Creer 04/06/2024, 9:38 AM

## 2024-04-06 NOTE — Progress Notes (Signed)
 Physical Therapy Session Note  Patient Details  Name: Theresa Watkins MRN: [id number] Date of Birth: 12/01/1965  Today's Date: 04/06/2024 PT Individual Time: 4098-1191 PT Individual Time Calculation (min): 40 min   Short Term Goals: Week 8: PT Short Term Goal 1 (Week 8): Pt will initiate stair training PT Short Term Goal 2 (Week 8): Pt will perform floor transfer with CGA or better  Skilled Therapeutic Interventions/Progress Updates:    pt received in bed and agreeable to therapy. No complaint of pain. Ambulatory transfer to w/c with min a and hemi walker. Pt then participated in gait training x 146 ft and x 90 ft (first ~45 ft with 2lb ankle weight, removed d/t bothering pt's knee). Pt continues to present with decr LLE stance time and foot clearance, as well as flexed knee and hip posture, though improving steadily vs previous sessions. Pt also navigated 6" stairs 2 x 4 with BIL hand rails. Pt declining education on using stronger leg to ascend, opting for alternating pattern on second set. Performed with min a and knee block, but no knee buckling noted at this time. Pt propelled w/c x 100 ft for BLE hamstring strength and endurance. Pt returned to room and to bed, was left with all needs in reach and alarm active.   Therapy Documentation Precautions:  Precautions Precautions: Fall Precaution/Restrictions Comments: L hemi Restrictions Weight Bearing Restrictions Per Provider Order: No General:       Therapy/Group: Individual Therapy  Tex Filbert 04/06/2024, 3:50 PM

## 2024-04-06 NOTE — Progress Notes (Signed)
 Occupational Therapy Session Note  Patient Details  Name: Theresa Watkins MRN: [id number] Date of Birth: [birth year]  Today's Date: 04/06/2024 OT Individual Time: 1000-1055 OT Individual Time Calculation (min): 55 min    Short Term Goals: Week 7:  OT Short Term Goal 1 (Week 7): Pt will perfrom toileting tasks with min A OT Short Term Goal 2 (Week 7): Pt will use LUE as gross assist during functional tasks with mod A  Skilled Therapeutic Interventions/Progress Updates:    Skilled Ot services with focus on transfers, toileting, bathing at showwer level, sitting balance, and safewty awareness to increase independence with BADLS. Pt continues to require min verbal cues for safety awareness during transitional movements. All transfers with min A. Bathing seated TTB with min A. Pt initiating use of LUE to assist with bathing tasks. Pt required assistance for thoroughness of RUE but complerted all other bathing tasks without assistance. Pt sat EOB to apply lotions using BUE to complete tasks. NT completed dressing tasks 2/2 time constratints. Handoff to NT [place].   Therapy Documentation Precautions:  Precautions Precautions: Fall Precaution/Restrictions Comments: L hemi Restrictions Weight Bearing Restrictions Per Provider Order: No Pain:  Pt denies pain this morning   Therapy/Group: Individual Therapy  [name] 04/06/2024, 2:57 PM

## 2024-04-07 DIAGNOSIS — I63511 Cerebral infarction due to unspecified occlusion or stenosis of right middle cerebral artery: Secondary | ICD-10-CM | POA: Diagnosis not present

## 2024-04-07 MED ORDER — TOPIRAMATE 25 MG PO TABS: 25.0000 mg | ORAL_TABLET | Freq: Every day | ORAL | Status: DC

## 2024-04-07 NOTE — Progress Notes (Signed)
 Physical Therapy Session Note  Patient Details  Name: Theresa Watkins MRN: [id number] Date of Birth: April 19, 1966  Today's Date: 04/07/2024  Short Term Goals: Week 8: PT Short Term Goal 1 (Week 8): Pt will initiate stair training PT Short Term Goal 2 (Week 8): Pt will perform floor transfer with CGA or better  Pt missed 30 min of skilled therapy. Pt supine asleep in bed on entrance to room. Pt awakened after several name calls with drowsy presentation, and feeling "loopy" per pt report and denied PT. Pt unable to keep eyes open to converse. Attending nsg alerted stated no new medication was provided. Will re-attempt as schedule and pt availability permits.  Therapy Documentation Precautions:  Precautions Precautions: Fall Precaution/Restrictions Comments: L hemi Restrictions Weight Bearing Restrictions Per Provider Order: (P) No Therapy/Group: Individual Therapy  Kayan Blissett PTA 04/07/2024, 7:54 AM

## 2024-04-07 NOTE — Progress Notes (Signed)
 Occupational Therapy Note  Patient Details  Name: Theresa Watkins MRN: [id number] Date of Birth: 1966-11-16  Today's Date: 04/07/2024 OT Missed Time: 75 Minutes Missed Time Reason: Other (comment);Patient unwilling/refused to participate without medical reason (Pt reports she is "loopy" from meds)  Pt sleeping in bed upon arrival but easily aroused. Pt reports she is "loopy" this morning and unable to do therapy. Pt with difficulty keeping eyes open and speech more dysarthric. Pt missed 75 mins skilled OT services. Will attempt to see pt again as schedule allows.    Andre Kawasaki [facility] 04/07/2024, 10:09 AM

## 2024-04-07 NOTE — Plan of Care (Signed)
  Problem: RH BOWEL ELIMINATION Goal: RH STG MANAGE BOWEL WITH ASSISTANCE Description: STG Manage Bowel with toileting Assistance. Outcome: Progressing   Problem: RH SAFETY Goal: RH STG ADHERE TO SAFETY PRECAUTIONS W/ASSISTANCE/DEVICE Description: STG Adhere to Safety Precautions With cues Assistance/Device. Outcome: Progressing   Problem: RH PAIN MANAGEMENT Goal: RH STG PAIN MANAGED AT OR BELOW PT'S PAIN GOAL Description: Pain managed < 4 with prns Outcome: Progressing   Problem: Consults Goal: RH GENERAL PATIENT EDUCATION Description: See Patient Education module for education specifics. Outcome: Progressing

## 2024-04-07 NOTE — Progress Notes (Signed)
 SLP Cancellation Note  Patient Details Name: Theresa Watkins MRN: [id number] DOB: [birth year]   Cancelled treatment: Patient alerted given moderate verbal stimulation upon SLP arrival. Pt noted with decreased speech intelligibility and continued fluctuating alertness warranting consistent re- alerting cues. Pt ultimately able to communicate feeling, "loopy" and tired as a result of medication. Pt reports inability to participate in speech therapy, thus pt missed 45 minutes of skilled intervention. SLP to make up minutes as pt and clinician schedule allows.                                                                                              Adela Holter 04/07/2024, 9:32 AM

## 2024-04-07 NOTE — Patient Care Conference (Signed)
 Inpatient RehabilitationTeam Conference and Plan of Care Update Date: 04/06/2024   Time: 1105 am    Patient Name: Theresa Watkins      Medical Record Number: [id number]  Date of Birth: 01/07/1966 Sex: Female         Room/Bed: 4W01C/4W01C-01 Payor Info: Payor: [place] MEDICAID PREPAID HEALTH PLAN / Plan: [place] MEDICAID HEALTHY BLUE / Product Type: *No Product type* /    Admit Date/Time:  02/09/2024  6:29 PM  Primary Diagnosis:  Right middle cerebral artery stroke [facility])  Hospital Problems: Principal Problem:   Right middle cerebral artery stroke (HCC) Active Problems:   Polysubstance abuse (HCC)    Expected Discharge Date: Expected Discharge Date:  (pending SNF)  Team Members Present: Physician leading conference: Dr. Celia Watkins Social Worker Present: Other (comment) (Theresa Watkins , RN) Nurse Present: Theresa Monks, RN PT Present: Theresa Watkins, PT OT Present: Theresa Watkins, OT;Theresa Watkins, COTA SLP Present: Theresa Watkins, SLP PPS Coordinator present : Theresa Watkins, SLP     Current Status/Progress Goal Weekly Team Focus  Bowel/Bladder   Continent of bladder/bowel,   Maintain continence,   Assess and offer toileting Q1-3 ha WA/, Toileting program , rncourage to take schedule laxative anr prn    Swallow/Nutrition/ Hydration               ADL's   bathing-mn A; LB dressing-mod A; UB dressing-CGA; transfers-min A; min verbal cues for safety and hemi dressing cues; LUE edema improved with resulting improved LUE grasp   min A overall; LB dressing downgraded to mod A; dynamic standing balance downgraded to min A; anticipatory awareness downgraded to mod A   safety awareness, BADLs, LUE NMR    Mobility   Supervision bed mobility, near supervision some transfers, otherwise min-CGA for safety. limited by safety awareness and impulsivity, gait with hemi walker 60 ft   supervision bed<>chair, min a short distance gait, mod i w/c  gait, transfers, NMR, balance     Communication                Safety/Cognition/ Behavioral Observations               Pain   Continue to c/o headaches and discomfort pain to left shoulder and hip occasssionally, prn cotinue, refuse Voltaran gel, prn Tyleno 3,   3/10 on pain scale,   Assess pain QS/PRN PROVIDE MEDICATION AND ENCOURAGE PATIENT TO TAKE MEDICATIONS    Skin   Left finger and hand abrasion, otherwisw intact   Skin integrity remains intact  Assess Skin QS/PRN,      Discharge Planning:  SNF with bed offer and insurance approval pending. [place] DSS referral for placement- pending as limited options for SNF with MA. APS referral declined.    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  Patient limited by left hand swelling, headaches,impulsivity, decreases safety awareness,  mixed dysarthria and self limiting behaviors.    Patient on target to meet rehab goals: Yes, Patient requires moderate assistance with upper body care and requires minimal assistance with lower body care. Patient requires minimal assistance with transfers. Patient able to ambulate 34' with supervision using a hemi-walker. Overall goals at discharge are set for minimal assistance and mod I assistance using a W/C.   *See Care Plan and progress notes for long and short-term goals.   Revisions to Treatment Plan:  Hemi dressing techniques  Theresa Watkins Hemi walker IOPI EMST device  Full supervision with meals  Teaching Needs: Safety, medications, transfers, toileting , etc.   Current Barriers to Discharge: Decreased caregiver support and Incontinence  Possible Resolutions to Barriers: Family Education SNF     Medical Summary Current Status: L hand swelling and pain somewhat better- Having HA's- but refusing TOpamax - still incontinent of bladder and occ bowel;  Barriers to Discharge: Behavior/Mood;Inadequate Nutritional  Intake;Incontinence;Neurogenic Bowel & Bladder;Self-care education;Medical stability;Spasticity  Barriers to Discharge Comments: self limiting -- gains minimal- still min A with Showering; also limited by HA's; L hand swelling; L hemiparesis- needs a lot of cues still- min-CGA for transfers- 33ft Hemiwalker- min A; SLP- regular; Should be full supervision to eat Possible Resolutions to Barriers/Weekly Focus: need to make sure Full supervision- needs to be OOB- in chair- to eat- chgeck on bra?- d/c SNF   Continued Need for Acute Rehabilitation Level of Care: The patient requires daily medical management by a physician with specialized training in physical medicine and rehabilitation for the following reasons: Direction of a multidisciplinary physical rehabilitation program to maximize functional independence : Yes Medical management of patient stability for increased activity during participation in an intensive rehabilitation regime.: Yes Analysis of laboratory values and/or radiology reports with any subsequent need for medication adjustment and/or medical intervention. : Yes   I attest that I was present, lead the team conference, and concur with the assessment and plan of the team.   Theresa Watkins 04/06/2024, 1105 am

## 2024-04-07 NOTE — Progress Notes (Signed)
 PROGRESS NOTE   Subjective/Complaints:  Pt reports took Topamax  last night and feels discombobulated this AM- a little "loopy"  Didn't sleep at all, which could be contributing.  L hand is "fine".  ROS:    Pt denies SOB, abd pain, CP, N/V/C/D, and vision changes   L hand pain/swelling- somewhat better + vision a little burry-continued-worse when having migraines this weekend  Objective:   No results found.      Recent Labs    04/05/24 0557  WBC 6.6  HGB 14.2  HCT 43.5  PLT 179          Recent Labs    04/06/24 0559  NA 139  K 4.2  CL 106  CO2 23  GLUCOSE 93  BUN 14  CREATININE 0.78  CALCIUM  8.7*           Intake/Output Summary (Last 24 hours) at 04/07/2024 0824 Last data filed at 04/06/2024 1751 Gross per 24 hour  Intake 480 ml  Output --  Net 480 ml            Physical Exam: Vital Signs Blood pressure 123/88, pulse 76, temperature 98.9 F (37.2 C), temperature source Oral, resp. rate 18, height 5\' 6"  (1.676 m), weight 72.3 kg, SpO2 93%.       General: awake, alert, but looks off kilter; but no sleep overnight; sitting up in bed;  NAD HENT: conjugate gaze; oropharynx moist CV: regular rate and rhythm; no JVD Pulmonary: CTA B/L; no W/R/R- good air movement GI: soft, NT, ND, (+)BS Psychiatric: appropriate Neurological: a little slurred words more than normal- harder to understand-  No HA this AM,  Stable mod dysarthria- sometimes cannot understand esp in early AM- still- did stop eating to talk to me! MAS of 1- 1+ in L wrist and hand/fingers MSK-L hand Less TTP- less swelling- not wearing glove Neuro: cognitively at baseline. Alert. Sl dysarthria. CN without changes. Able to count fingers. MAS of 0-1 in LUE Extremities: L hand  Motor strength 0/5 LUE 3-/5 LLE 5/5 on right side  Skin- L PIP of 2nd digit- healing scab     Assessment/Plan: 1. Functional deficits  which require 3+ hours per day of interdisciplinary therapy in a comprehensive inpatient rehab setting. Physiatrist is providing close team supervision and 24 hour management of active medical problems listed below. Physiatrist and rehab team continue to assess barriers to discharge/monitor patient progress toward functional and medical goals  Care Tool:  Bathing    Body parts bathed by patient: Left arm, Chest, Abdomen, Front perineal area, Buttocks, Right upper leg, Left upper leg, Right lower leg, Left lower leg, Face   Body parts bathed by helper: Right arm Body parts n/a: Right arm   Bathing assist Assist Level: Minimal Assistance - Patient > 75%     Upper Body Dressing/Undressing Upper body dressing   What is the patient wearing?: Pull over shirt    Upper body assist Assist Level: Supervision/Verbal cueing    Lower Body Dressing/Undressing Lower body dressing      What is the patient wearing?: Pants, Underwear/pull up     Lower body assist Assist for lower body dressing: Minimal Assistance -  Patient > 75%     Toileting Toileting    Toileting assist Assist for toileting: Moderate Assistance - Patient 50 - 74%     Transfers Chair/bed transfer  Transfers assist     Chair/bed transfer assist level: Contact Guard/Touching assist     Locomotion Ambulation   Ambulation assist      Assist level: Minimal Assistance - Patient > 75% Assistive device: Walker-hemi Max distance: 146   Walk 10 feet activity   Assist     Assist level: Minimal Assistance - Patient > 75% Assistive device: Walker-hemi   Walk 50 feet activity   Assist Walk 50 feet with 2 turns activity did not occur: Safety/medical concerns (L hemiplegia and pt fatigue)  Assist level: Minimal Assistance - Patient > 75% Assistive device: Walker-hemi    Walk 150 feet activity   Assist Walk 150 feet activity did not occur: Safety/medical concerns (L hemiplegia and pt fatigue)  Assist  level: Minimal Assistance - Patient > 75% Assistive device: Walker-Eva    Walk 10 feet on uneven surface  activity   Assist Walk 10 feet on uneven surfaces activity did not occur: Safety/medical concerns (L hemiplegia and pt fatigue)         Wheelchair     Assist Is the patient using a wheelchair?: Yes Type of Wheelchair: Manual    Wheelchair assist level: Supervision/Verbal cueing Max wheelchair distance: 50    Wheelchair 50 feet with 2 turns activity    Assist        Assist Level: Supervision/Verbal cueing   Wheelchair 150 feet activity     Assist      Assist Level: Minimal Assistance - Patient > 75%   Blood pressure 123/88, pulse 76, temperature 98.9 F (37.2 C), temperature source Oral, resp. rate 18, height 5\' 6"  (1.676 m), weight 72.3 kg, SpO2 93%.   Medical Problem List and Plan: 1. 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 Search for facility ongoing. May only be able to go to custodial care facility  D/c SNF-  Con't CIR - will change her Topamax  to 25 mg at bedtime for HA's and to reduce "loopiness" 2. DVT/anticoagulation:  Pharmaceutical: Lovenox              -antiplatelet therapy: Aspirin  81 mg daily and Plavix  75 mg daily x 3 weeks then Plavix  alone 3. Pain Management: Tylenol  as needed  3/25- denies pain- con't regimen rpn   3-30: Ongoing left hip pain; Voltaren  gel and Tylenol .  4/3- having new burning pain in LUE- which is seen sometimes with stroke- will add Duloxetine  30 mg daily for nerve pain- monitor for side effects.  -if gets nausea, will change to Lyrica  4/4-6 no side effects so far-  4/8- will increase Duloxetine  to 60 mg daily  DC hydrocodone  due to side  effects, trial tyl #3  as needed for pain that is not relieved by Tylenol . 4/28- advised pt to use voltaren  gel  on all sides of L hand including between fingers- use tylenol  #3 as needed 4/29- pt admits doesn't like taking/using meds all the time- explained I don't have a "fix"- to make it better- have to  take meds to have them work 4/30- encouraged again to use voltaren  4x/day for L hand 5/1- pt said using voltaren  gel and tylenol  #3 as needed- adding lidoderm  patches 2 patches at night for back pain 5/2- HA's- will restart SCHEDULED Topamax  75 mg at bedtime- not sure what occurred? Needs to get nightly 5/9- hasn't asked for pain meds this Am including tylenol , so wants to not register the complaint" yet today.  5/11- Add Lyrica  50 mg BID for nerve pain in L hand 5/13- changed to q6pm since pt feels it affected her vision-made it worse 5/14- said pain better today- not clear if due to prednisone  vs Lyrica ? 5/15 pain much better in L hand-  pt feels it's gout? It might be 5/16- hand pain slightly better with ACE wrap overnight- which is fine 5/20- pt reports having migraines every AM, but refusing topamax  last few days- at least 3- will go back and d/w pt and NOT make changes as we discussed 5/21- will reduce Topamax  to 25 mg at bedtime to help with feelinga little confused 4. Depression/Behavior/Sleep: Provide emotional support  4/21 mood improved--continue duloxetine              -antipsychotic agents: N/A 5. Neuropsych/cognition: This patient is capable of making decisions on her own behalf. 6. Skin/Wound Care: Routine skin checks 7. Fluids/Electrolytes/Nutrition:encouraging po.   5/12- I personally reviewed the patient's labs today.     -encouraging PO 8.  Dysphagia.  Dysphagia 3 with thins currently  4/18 intake ok 5/11-5/13- advised pt to take smaller bites-  intake good, but takes too big of bites    5/14- switched back to full supervision- pt unhappy, but wasn't listening when I  discussed more of how to eat/take bites 5/16- taking smaller bites with full supervision 5/18 eating 65 to 95% of her meals today 5/19- back to being intermittent supervision- eating big bites, but stopped when spoke to me! 9.  Hyperlipidemia.  Crestor  10.  Recent History of cocaine/tobacco use.  UDS positive for cocaine.  Provide counseling, this has been a chronic issue , seen in PCP notes 11.  Hypothyroidism. continue Synthroid   12.  Constipation.   Senokot-S 2 tab twice daily  5/6- LBM yesterday even though chart says 5/3  5/7- will add Miralax - esp since increased Duloxetine  which can cause constipation- already on Senna 2 tabs BID  5/9- LBM documented as yesterday- medium BM  5.10- LBM 2 days ago- thinks can go today  LBM 5/12  5/15- LBM yesterday  5/17 Increase miralax  to BID, add sorbitol  PRN  5/18 appears she has been refusing miralax , will check abd xray, suspect constipation. sorbitol   5/19- up all night having Bms, but cleaned out  5/20- LBM yesterday 15. Blurry vision and dysconjugate gaze  3/27- will need Ophtho after d/c.   4/4- pt reports everything blurry- cannot see faces as a result- never had eye checked in past- used readers but cannot see at a distance since stroke- will send to Ophtho after d/c.   4/7- d/w pt again- she wants eyeglasses- explained we cannot get when in hospital- also per guidelines, since vision can still change, to not get for 3 months after stroke. She disbelieves it's from stroke, since had 2 prior- that didn't cause vision changes- we educated pt pn how this can occur.   4/25- reminded pt cannot get Optho in hospital- need to get once d/c'd  4/30 went over visual changes with pt- will need to get glasses, likely  prisms 2-3 months after d/c once vision stabilizes  5/6- d/w pt again- she wants to see Optho- explained they do not come into hospital for this  5/18 continued blurry vision, discussed outpatient f/u 16.  Left hip pain.  -3-29 x-ray  showing moderate degenerative joint disease of the hip; no fracture  -Continue Voltaren  gel to hip 4 times daily  -May benefit from steroid injection as an outpatient  3/31- started Vit D and Ca per pt request  - improved to an extent  4/24- Pt reports L hip still hurts when stands/works with therapy however don't want her on opiates regularly due to can impair sensorium and she also ready has issues  5/20- pt refusing voltaren  gel 17. Severe HA-daily HA's  4/15-16 sx appear improved with topamax , tylenol  helps also   -may use sx to get out of therapy at times?  4/23- Per team, pt having HA's frequently which impairs therapy- she won't discuss HA's with me- unclear if only has a day goes on vs self limting behavior  4/24- increase to 75 mg at bedtime  4/25- still having HA's, actually thinks a little worse since not taking Norco for it-   4/28-4/29 fewer complaints about HA this AM  4/30-5/1- - per staff, c/o these less- not interfering with therapy as much  5/2- Wasn't getting topamax - just taking T#3- wil restart Topamax - don't know what occurred with order  5/6- HA's doing better  5/8- not requiring T#3 anymore lately per pt  5/13- Severe HA 5/11 in afternoon- took T#3  5/17 headache today does not sound severe, she has not taken her Tylenol  3 yet but nursing is getting it for her  5/18 headache improved with Tylenol  No. 3 today  5/20- refusing topamax - for past 3+ days- will go back and d/w pt 18. Decreased safety awareness/impulsiveness  4/30- still requiring max cues  5/7- 5/10- no change-per therapy- still requiring max cues 20. Spasticity  4/22- off baclofen , and c/o LUE pain- wondering how much of her complaints are due to tone?-   4/30- Spasticity improved in LUE- MAS of 0 actually this AM  5/11-12- Getting some spasticity vs disuse tightness inL hand- fingers esp 3rd/4th DIPs- might need Botox after d/c  5/14- MAS of 1 to 1+ in L wrist and fingers- not proximally 21. L 2nd  digit PIP lesion-   4/10- needs outpt f/u with Derm- concern of basal cell > squamous cell, but does appear slightly shiny- carcinoma based on appearance. .   4/30- pt pulled scab off-  22.  Urinary  and bowel incont due to CVA, cont toileting program 4/23- still cannot control B/B 4/30- on toileting program- is getting somewhat better, but still incontinent more at night  5/13- still incontinent at night  5/18 continued incontinence  23. 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   4/29- will get Dopplers, because pt says she elevated hand overnight- still swollen- ordered  4/30- Dopplers (-)- will get isotoner compression glove  5/4 received glove  5/6- Swelling ~ 50% better- will increase Duloxetine  to 120 mg daily for nerve pain in L hand and mood.   5/7- hand sore this AM- but not "killing her".  5/8- pain great this AM- doesn't need meds other than tylenol  per pt -sounds like increased cymbalta  has helped  5/9- Some pain in L hand this Am, but hasn't taken tylenol  or T#3 yet this Am- 5/13- Will decrease Lyrica  to 50 mg q6pm since caused her to have visual changes early in day  5/14- Pain doing better in last 12+ hours- not as painful 24. C/O catch in breath- no SOB  5/9- asked pt to use ICS every 1 hour while awake- since probably is atelectasis  25. Dry eyes  5/10- ordered eye drops as needed for pt 26. Gout? Swelling of L hand  5/13- pt has never had gout but runs in her family- she wants to try something else ot see if would help L hand- will try Colchicine  0.6 mg daily x 4 days as well as Prednisone  20 mg daily x 4 days-  5/14- pt feels pain and swelling are better- not sure if due to gout, or just prednisone  vs  power of suggestion- will con't to monitor  5/15- pt feels swelling and pain is better- maybe it is gout? Vs overall improvement with prednisone  27. Vaginal candidiasis  5/15- pt reports Sx's- will give Diflucan  150 mg x1  I spent a total of  36  minutes on total care today- >50% coordination of care- due to  D/w pt about topamax ; also spoke with nursing and NT about making sure pt up ot chair to eat for all meals  LOS: 58 days A FACE TO FACE EVALUATION WAS PERFORMED  Micahel Omlor 04/07/2024, 8:24 AM

## 2024-04-08 DIAGNOSIS — I63511 Cerebral infarction due to unspecified occlusion or stenosis of right middle cerebral artery: Secondary | ICD-10-CM | POA: Diagnosis not present

## 2024-04-08 NOTE — Progress Notes (Signed)
 Occupational Therapy Note  Patient Details  Name: Theresa Watkins MRN: [id number] Date of Birth: 1966-08-24   Attempted to make up 99' of missed therapy time from yesterday. Pt currently eating lunch and open to working with OT although stated she eats slow and needs an hour.    Carollee Circle, OTR/L,CBIS  Supplemental OT - [facility]   04/08/2024, 1:04 PM

## 2024-04-08 NOTE — Progress Notes (Signed)
 Speech Language Pathology Daily Session Note  Patient Details  Name: Theresa Watkins MRN: [id number] Date of Birth: 1966-08-31  Today's Date: 04/08/2024 SLP Individual Time: 4098-1191 SLP Individual Time Calculation (min): 24 min  Short Term Goals: Week 8: SLP Short Term Goal 1 (Week 8): Patient will communicate complex information with 90% intelligibility and sup A SLP Short Term Goal 2 (Week 8): Patient will utilize swallowing compensatory strategies twith sup A in order to decrease risk of aspiration of regular solids and thin liquids/  Skilled Therapeutic Interventions: SLP conducted skilled therapy session targeting dysphagia and communication goals. Upon entry, patient requested to transfer from chair to bed with nursing present. Patient demonstrated impaired safety during transfer with nursing. Nursing then administered medications whole with thin liquids. Patient tolerated without overt difficulty nor overt s/sx of penetration/aspiration. Patient indicated no difficulty with breakfast tray items. SLP then conducted various communication tasks targeting communication of complex information utilizing speech intelligibility strategies. Patient benefited from supervision increasing to min assist to achieve 90% accuracy, often self correcting moments of imprecise articulation. Need for cues increased due to increasing fatigue across session. Patient was left in room with call bell in reach and alarm set. SLP will continue to target goals per plan of care.        Pain Pain Assessment Pain Scale: Faces Pain Score: 8  Faces Pain Scale: No hurt Pain Location: Head  Therapy/Group: Individual Therapy  Cort Dragoo, M.A., CCC-SLP  [name] 04/08/2024, 8:58 AM

## 2024-04-08 NOTE — Plan of Care (Signed)
  Problem: RH BOWEL ELIMINATION Goal: RH STG MANAGE BOWEL WITH ASSISTANCE Description: STG Manage Bowel with toileting Assistance. Outcome: Progressing   Problem: RH SAFETY Goal: RH STG ADHERE TO SAFETY PRECAUTIONS W/ASSISTANCE/DEVICE Description: STG Adhere to Safety Precautions With cues Assistance/Device. Outcome: Progressing   Problem: RH PAIN MANAGEMENT Goal: RH STG PAIN MANAGED AT OR BELOW PT'S PAIN GOAL Description: Pain managed < 4 with prns Outcome: Progressing   Problem: Consults Goal: RH GENERAL PATIENT EDUCATION Description: See Patient Education module for education specifics. Outcome: Progressing

## 2024-04-08 NOTE — Progress Notes (Signed)
 Pt refused Topamax /Miralax  at hs, reports Topamax  makes her unable to function in the morning and does not want take it again. Provided education regarding dosage decrease but she still will not take it.    Bertram Brocks  RN

## 2024-04-08 NOTE — Progress Notes (Signed)
 Speech Language Pathology Daily Session Note  Patient Details  Name: Theresa Watkins MRN: [id number] Date of Birth: [birth year]  Today's Date: 04/08/2024 SLP Individual Time: 7829-5621 SLP Individual Time Calculation (min): 13 min  Short Term Goals: Week 8: SLP Short Term Goal 1 (Week 8): Patient will communicate complex information with 90% intelligibility and sup A SLP Short Term Goal 2 (Week 8): Patient will utilize swallowing compensatory strategies twith sup A in order to decrease risk of aspiration of regular solids and thin liquids/  Skilled Therapeutic Interventions: Skilled therapy session focused on communication goals. SLP provided supervision A for completion of EMST (expiratory muscle strength training) set at 35cm H2O. Patient completed 5 sets of 5 repetitions. Patient with continued improvements in speech quality at the conversational level reaching 90% this date with supervision-mod I A. Patient left in bed with alarm set and call bell in reach. Continue POC.    Pain None reported   Therapy/Group: Individual Therapy  Indigo Barbian F Alyan Hartline 04/08/2024, 3:31 PM

## 2024-04-08 NOTE — Progress Notes (Signed)
 PROGRESS NOTE   Subjective/Complaints:  Pt reports is back  99.8% resolved from Topamax  the night before- but was so loopy yesterday.   Didn't take 25 mg at bedtime topamax  last night.  Appetite is back and wants ot eat 2 breakfasts! Per PT, using L hand a lot more LBM x2 yesterday- refusing mirlax but willing to take Senna.  Getting HA but hasn't had AM meds.     ROS:    Pt denies SOB, abd pain, CP, N/V/C/D, and vision changes   L hand pain/swelling- somewhat better + vision a little burry-continued-worse when having migraines this weekend  Objective:   No results found.      No results for input(s): "WBC", "HGB", "HCT", "PLT" in the last 72 hours.         Recent Labs    04/06/24 0559  NA 139  K 4.2  CL 106  CO2 23  GLUCOSE 93  BUN 14  CREATININE 0.78  CALCIUM  8.7*           Intake/Output Summary (Last 24 hours) at 04/08/2024 0931 Last data filed at 04/08/2024 0909 Gross per 24 hour  Intake 595 ml  Output --  Net 595 ml            Physical Exam: Vital Signs Blood pressure 108/84, pulse 78, temperature 98.2 F (36.8 C), resp. rate 16, height 5\' 6"  (1.676 m), weight 72.3 kg, SpO2 95%.        General: awake, alert, appropriate, sitting up eating breakfast, OT at bedside;  NAD HENT: conjugate gaze; oropharynx moist CV: regular rate and rhythm; no JVD Pulmonary: CTA B/L; no W/R/R- good air movement GI: soft, NT, ND, (+)BS Psychiatric: appropriate- actually bright affect this AM Neurological: Ox3- still dysarthric, but much better than yesterday  MAS of 1- 1+ in L wrist and hand/fingers MSK-L hand Less TTP- less swelling- not wearing glove Neuro: cognitively at baseline. Alert. Sl dysarthria. CN without changes. Able to count fingers. MAS of 0-1 in LUE Extremities: L hand - less swelling and using to help feed herself Motor strength 0/5 LUE 3-/5 LLE 5/5 on right side   Skin- L PIP of 2nd digit- healing scab     Assessment/Plan: 1. Functional deficits which require 3+ hours per day of interdisciplinary therapy in a comprehensive inpatient rehab setting. Physiatrist is providing close team supervision and 24 hour management of active medical problems listed below. Physiatrist and rehab team continue to assess barriers to discharge/monitor patient progress toward functional and medical goals  Care Tool:  Bathing    Body parts bathed by patient: Left arm, Chest, Abdomen, Front perineal area, Buttocks, Right upper leg, Left upper leg, Right lower leg, Left lower leg, Face   Body parts bathed by helper: Right arm Body parts n/a: Right arm   Bathing assist Assist Level: Minimal Assistance - Patient > 75%     Upper Body Dressing/Undressing Upper body dressing   What is the patient wearing?: Pull over shirt    Upper body assist Assist Level: Supervision/Verbal cueing    Lower Body Dressing/Undressing Lower body dressing      What is the patient wearing?: Pants, Underwear/pull up  Lower body assist Assist for lower body dressing: Minimal Assistance - Patient > 75%     Toileting Toileting    Toileting assist Assist for toileting: Moderate Assistance - Patient 50 - 74%     Transfers Chair/bed transfer  Transfers assist     Chair/bed transfer assist level: Contact Guard/Touching assist     Locomotion Ambulation   Ambulation assist      Assist level: Minimal Assistance - Patient > 75% Assistive device: Walker-hemi Max distance: 146   Walk 10 feet activity   Assist     Assist level: Minimal Assistance - Patient > 75% Assistive device: Walker-hemi   Walk 50 feet activity   Assist Walk 50 feet with 2 turns activity did not occur: Safety/medical concerns (L hemiplegia and pt fatigue)  Assist level: Minimal Assistance - Patient > 75% Assistive device: Walker-hemi    Walk 150 feet activity   Assist Walk 150 feet  activity did not occur: Safety/medical concerns (L hemiplegia and pt fatigue)  Assist level: Minimal Assistance - Patient > 75% Assistive device: Walker-Eva    Walk 10 feet on uneven surface  activity   Assist Walk 10 feet on uneven surfaces activity did not occur: Safety/medical concerns (L hemiplegia and pt fatigue)         Wheelchair     Assist Is the patient using a wheelchair?: Yes Type of Wheelchair: Manual    Wheelchair assist level: Supervision/Verbal cueing Max wheelchair distance: 50    Wheelchair 50 feet with 2 turns activity    Assist        Assist Level: Supervision/Verbal cueing   Wheelchair 150 feet activity     Assist      Assist Level: Minimal Assistance - Patient > 75%   Blood pressure 108/84, pulse 78, temperature 98.2 F (36.8 C), resp. rate 16, height 5\' 6"  (1.676 m), weight 72.3 kg, SpO2 95%.   Medical Problem List and Plan: 1. 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 Search for facility ongoing. May only be able to go to custodial care facility  D/c SNF-  Con't CIR  Will d/c topamax  completely 2. DVT/anticoagulation:  Pharmaceutical: Lovenox              -antiplatelet therapy: Aspirin  81 mg daily and Plavix  75 mg daily x 3 weeks then Plavix  alone 3. Pain Management: Tylenol  as needed  3/25- denies pain- con't regimen rpn   3-30: Ongoing left hip pain; Voltaren  gel and Tylenol .  4/3- having new burning pain in LUE- which is seen sometimes with stroke- will add Duloxetine  30 mg daily for nerve pain- monitor for side effects.  -if gets nausea, will change to Lyrica  4/4-6 no side effects so far-  4/8- will increase Duloxetine  to 60 mg daily  DC hydrocodone  due to  side effects, trial tyl #3  as needed for pain that is not relieved by Tylenol . 4/28- advised pt to use voltaren  gel  on all sides of L hand including between fingers- use tylenol  #3 as needed 4/29- pt admits doesn't like taking/using meds all the time- explained I don't have a "fix"- to make it better- have to take meds to  have them work 4/30- encouraged again to use voltaren  4x/day for L hand 5/1- pt said using voltaren  gel and tylenol  #3 as needed- adding lidoderm  patches 2 patches at night for back pain 5/2- HA's- will restart SCHEDULED Topamax  75 mg at bedtime- not sure what occurred? Needs to get nightly 5/9- hasn't asked for pain meds this Am including tylenol , so wants to not register the complaint" yet today.  5/11- Add Lyrica  50 mg BID for nerve pain in L hand 5/13- changed to q6pm since pt feels it affected her vision-made it worse 5/14- said pain better today- not clear if due to prednisone  vs Lyrica ? 5/15 pain much better in L hand-  pt feels it's gout? It might be 5/16- hand pain slightly better with ACE wrap overnight- which is fine 5/20- pt reports having migraines every AM, but refusing topamax  last few days- at least 3- will go back and d/w pt and NOT make changes as we discussed 5/21- will reduce Topamax  to 25 mg at bedtime to help with feelinga little confused 5/22- Will stop Topamax  since made her so "loopy"- pt doesn't want any other meds to try to help HA's- said will take Tylenol  4. Depression/Behavior/Sleep: Provide emotional support  4/21 mood improved--continue duloxetine              -antipsychotic agents: N/A 5. Neuropsych/cognition: This patient is capable of making decisions on her own behalf. 6. Skin/Wound Care: Routine skin checks 7. Fluids/Electrolytes/Nutrition:encouraging po.   5/12- I personally reviewed the patient's labs today.     -encouraging PO 8.  Dysphagia.  Dysphagia 3 with thins currently  4/18 intake ok 5/11-5/13- advised pt to take smaller  bites-  intake good, but takes too big of bites    5/14- switched back to full supervision- pt unhappy, but wasn't listening when I discussed more of how to eat/take bites 5/16- taking smaller bites with full supervision 5/18 eating 65 to 95% of her meals today 5/22- Back to full supervision- has to sit completely upright to eat 9.  Hyperlipidemia.  Crestor  10.  Recent History of cocaine/tobacco use.  UDS positive for cocaine.  Provide counseling, this has been a chronic issue , seen in PCP notes 11.  Hypothyroidism. continue Synthroid   12.  Constipation.   Senokot-S 2 tab twice daily  5/6- LBM yesterday even though chart says 5/3  5/7- will add Miralax - esp since increased Duloxetine  which can cause constipation- already on Senna 2 tabs BID  5/9- LBM documented as yesterday- medium BM  5.10- LBM 2 days ago- thinks can go today  LBM 5/12  5/15- LBM yesterday  5/17 Increase miralax  to BID, add sorbitol  PRN  5/18 appears she has been refusing miralax , will check abd xray, suspect constipation. sorbitol   5/19- up all night having Bms, but cleaned out  5/20- LBM yesterday  5/22- will d/c Miralax - not taking- and con't Senna 15. Blurry vision and dysconjugate gaze  3/27- will need Ophtho after d/c.   4/4- pt reports everything blurry- cannot see faces as a result- never had eye checked in past- used readers but cannot see at a distance since stroke- will send to Ophtho after d/c.   4/7- d/w pt again- she wants eyeglasses- explained we cannot get when in hospital- also per guidelines, since vision can still change, to not get for 3 months after stroke. She disbelieves it's from stroke, since had 2 prior- that didn't cause vision changes- we educated pt pn how this can occur.  4/25- reminded pt cannot get Optho in hospital- need to get once d/c'd  4/30 went over visual changes with pt- will need to get glasses, likely prisms 2-3 months after d/c once vision stabilizes  5/6- d/w pt again- she  wants to see Optho- explained they do not come into hospital for this  5/18 continued blurry vision, discussed outpatient f/u 16.  Left hip pain.  -3-29 x-ray showing moderate degenerative joint disease of the hip; no fracture  -Continue Voltaren  gel to hip 4 times daily  -May benefit from steroid injection as an outpatient  3/31- started Vit D and Ca per pt request  - improved to an extent  4/24- Pt reports L hip still hurts when stands/works with therapy however don't want her on opiates regularly due to can impair sensorium and she also ready has issues  5/20- pt refusing voltaren  gel 17. Severe HA-daily HA's  4/15-16 sx appear improved with topamax , tylenol  helps also   -may use sx to get out of therapy at times?  4/23- Per team, pt having HA's frequently which impairs therapy- she won't discuss HA's with me- unclear if only has a day goes on vs self limting behavior  4/24- increase to 75 mg at bedtime  4/25- still having HA's, actually thinks a little worse since not taking Norco for it-   4/28-4/29 fewer complaints about HA this AM  4/30-5/1- - per staff, c/o these less- not interfering with therapy as much  5/2- Wasn't getting topamax - just taking T#3- wil restart Topamax - don't know what occurred with order  5/6- HA's doing better  5/8- not requiring T#3 anymore lately per pt  5/13- Severe HA 5/11 in afternoon- took T#3  5/17 headache today does not sound severe, she has not taken her Tylenol  3 yet but nursing is getting it for her  5/18 headache improved with Tylenol  No. 3 today  5/20- refusing topamax - for past 3+ days- will go back and d/w pt 18. Decreased safety awareness/impulsiveness  4/30- still requiring max cues  5/7- 5/10- no change-per therapy- still requiring max cues 20. Spasticity  4/22- off baclofen , and c/o LUE pain- wondering how much of her complaints are due to tone?-   4/30- Spasticity improved in LUE- MAS of 0 actually this AM  5/11-12- Getting some  spasticity vs disuse tightness inL hand- fingers esp 3rd/4th DIPs- might need Botox after d/c  5/14- MAS of 1 to 1+ in L wrist and fingers- not proximally 21. 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 .   4/30- pt pulled scab off-  22.  Urinary  and bowel incont due to CVA, cont toileting program 4/23- still cannot control B/B 4/30- on toileting program- is getting somewhat better, but still incontinent more at night  5/13- still incontinent at night  5/18 continued incontinence  23. 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   4/29- will get Dopplers, because pt says she elevated hand overnight- still swollen- ordered  4/30- Dopplers (-)- will get isotoner compression glove  5/4 received glove  5/6- Swelling ~ 50%  better- will increase Duloxetine  to 120 mg daily for nerve pain in L hand and mood.   5/7- hand sore this AM- but not "killing her".   5/8- pain great this AM- doesn't need meds other than tylenol  per pt -sounds like increased cymbalta  has helped  5/9- Some pain in L hand this Am, but hasn't taken tylenol  or T#3 yet this Am- 5/13- Will decrease Lyrica  to 50 mg q6pm since caused her to have visual changes early in day  5/14- Pain doing better in last 12+ hours- not as painful 24. C/O catch in breath- no SOB  5/9- asked pt to use ICS every 1 hour while awake- since probably is atelectasis  25. Dry eyes  5/10- ordered eye drops as needed for pt 26. Gout? Swelling of L hand  5/13- pt has never had gout but runs in her family- she wants to try something else ot see if would help L hand- will try  Colchicine  0.6 mg daily x 4 days as well as Prednisone  20 mg daily x 4 days-  5/14- pt feels pain and swelling are better- not sure if due to gout, or just prednisone  vs power of suggestion- will con't to monitor  5/15- pt feels swelling and pain is better- maybe it is gout? Vs overall improvement with prednisone  27. Vaginal candidiasis  5/15- pt reports Sx's- will give Diflucan  150 mg x1   5/22- no more complaints   I spent a total of  35  minutes on total care today- >50% coordination of care- due to  D/w OT about recovery as well as Topamax  and Miralax - changed meds  LOS: 59 days A FACE TO FACE EVALUATION WAS PERFORMED  Theresa Watkins 04/08/2024, 9:31 AM

## 2024-04-08 NOTE — Progress Notes (Signed)
 Physical Therapy Session Note  Patient Details  Name: Theresa Watkins MRN: [id number] Date of Birth: 09/09/1966  Today's Date: 04/08/2024 PT Individual Time: 1100-1155 PT Individual Time Calculation (min): 55 min   Short Term Goals: Week 7:  PT Short Term Goal 1 (Week 7): pt will initiate stair navigation PT Short Term Goal 1 - Progress (Week 7): Not met PT Short Term Goal 2 (Week 7): pt will initiate fall recovery training PT Short Term Goal 2 - Progress (Week 7): Met  Skilled Therapeutic Interventions/Progress Updates:    pt received in bed and agreeable to therapy. No complaint of pain.   Bed mobility with min a for trunk elevation. Increased time d/t pt sleeping on arrival and low arousal for first part of session. Pt declined assist with dressing, so donned pants and shoes with set up and increased time for problem solving.   Assisted with pulling pants over hips in standing and CGA Stand pivot transfer with HW.   Pt set up in lite gait harness and assisted onto treadmill. Pt in partial bodyweight support via harness throughout to minimize flexed knee posture. Attempted to cue for active knee extension but pt resistant to this cueing and only accepting raising harness to correct posture. Pt walked total of 523 ft in 15:44 with several standing rest breaks in harness. Pt with difficulty clearing LLE and decreased LLE stance time, with extremely heavy footfalls with RLE. By end of session, pt able to ambulate up to 0.6 mph, with improved step length and force with cues for kicking toward front of treadmill with each step. Continued with limited foot clearance, but able to clear some steps. Pt returned to room and to bed after session, was left with all needs in reach and alarm active.   Therapy Documentation Precautions:  Precautions Precautions: Fall Precaution/Restrictions Comments: L hemi Restrictions Weight Bearing Restrictions Per Provider Order: No General:        Therapy/Group: Individual Therapy  Tex Filbert 04/08/2024, 11:06 AM

## 2024-04-08 NOTE — Progress Notes (Signed)
 Occupational Therapy Session Note  Patient Details  Name: Theresa Watkins MRN: [id number] Date of Birth: Sep 10, 1966  Today's Date: 04/08/2024 OT Individual Time: [id number] OT Individual Time Calculation (min): 71 min    Short Term Goals: Week 7:  OT Short Term Goal 1 (Week 7): Pt will perfrom toileting tasks with min A OT Short Term Goal 2 (Week 7): Pt will use LUE as gross assist during functional tasks with mod A  Skilled Therapeutic Interventions/Progress Updates:    Pt resting in bed upon arrival, requesting to use toilet. Squat pivot transfer to w/c with min A. Toilet transfer using grab bar with CGA. Pt able to pull pants down and complete hygiene but required assistance to pull pants up. Min verbal cues for safety and technique. Pt initiating use of LUE to grasp bar during transfer. Pt returned to room and remained in w/c to eat breakfast. Skilled OT intervention with focus on functional transfers, LUE functional use, safety awareness/impulsivity, and activity tolerance to incerase independence with BADLs. Pt continues to demonstrate improved use of LUE in all funcitonal tasks: assisting with placing reading glasses on face, picking up utensils, holding containers when opening with RUE, picking up napkin. Pt continues to required min verbal cues for swallowing precautions, primarily when talking with food in mouth. Pt currently using LUE as gross assist in funcitonal tasks. Pt remaned in w/c eating breakfast. RN notified of pt's status.   Therapy Documentation Precautions:  Precautions Precautions: Fall Precaution/Restrictions Comments: L hemi Restrictions Weight Bearing Restrictions Per Provider Order: No Pain:  Pt reports she is "starting to get a HA"; RN and MD aware  Therapy/Group: Individual Therapy  [name] 04/08/2024, 8:13 AM

## 2024-04-09 DIAGNOSIS — I63511 Cerebral infarction due to unspecified occlusion or stenosis of right middle cerebral artery: Secondary | ICD-10-CM | POA: Diagnosis not present

## 2024-04-09 LAB — URINALYSIS, ROUTINE W REFLEX MICROSCOPIC
Bilirubin Urine: NEGATIVE
Glucose, UA: NEGATIVE mg/dL
Hgb urine dipstick: NEGATIVE
Ketones, ur: NEGATIVE mg/dL
Leukocytes,Ua: NEGATIVE
Nitrite: NEGATIVE
Protein, ur: NEGATIVE mg/dL
Specific Gravity, Urine: 1.018 (ref 1.005–1.030)
pH: 5 (ref 5.0–8.0)

## 2024-04-09 NOTE — Plan of Care (Signed)
  Problem: Consults Goal: RH STROKE PATIENT EDUCATION Description: See Patient Education module for education specifics  Outcome: Progressing   Problem: RH SAFETY Goal: RH STG ADHERE TO SAFETY PRECAUTIONS W/ASSISTANCE/DEVICE Description: STG Adhere to Safety Precautions With  cues Assistance/Device. Outcome: Progressing   

## 2024-04-09 NOTE — Progress Notes (Signed)
 Occupational Therapy Weekly Progress Note  Patient Details  Name: Theresa Watkins MRN: [id number] Date of Birth: 14-Dec-1965  Beginning of progress report period: Apr 01, 2024 End of progress report period: Apr 09, 2024   Patient has met 2 of 2 short term goals.  Pt progress has been inconsistent over the past week. Pt has missed time in 3 OT sessions during this time period. Pt is progressing with transfers, BADLs, and LUE use. Pt continues to require min/mod verbal cues for safety awareness. Transfers with min A. Toileting with min A. Bathing at shower level with min A. LB dressing with mod A. Pt currently awaiting SNF placement.  Patient continues to demonstrate the following deficits: muscle weakness, muscle joint tightness, and muscle paralysis, abnormal tone, unbalanced muscle activation, and decreased coordination, decreased attention to left, and decreased attention, decreased awareness, decreased problem solving, and decreased safety awareness and therefore will continue to benefit from skilled OT intervention to enhance overall performance with BADL.  Patient progressing toward long term goals..  Continue plan of care.  OT Short Term Goals Week 8: OT Short Term Goal 1 (Week 8): Pt will perfrom toileting tasks with min A OT Short Term Goal 1 - Progress (Week 8): Met OT Short Term Goal 2 (Week 8): Pt will use LUE as gross assist during functional tasks with mod A OT Short Term Goal 2 - Progress (Week 8): Met Week 9: OT Short Term Goal 1 (Week 9): Pt will perform toileting tasks with CGA OT Short Term Goal 2 (Week 9): Pt will use LUE as gross assist with min A during functional tasks   [name] 04/09/2024, 6:52 AM

## 2024-04-09 NOTE — Progress Notes (Signed)
 Occupational Therapy Session Note  Patient Details  Name: Theresa Watkins MRN: [id number] Date of Birth: 06-26-1966  Today's Date: 04/09/2024 OT Individual Time: 0715-0827 OT Individual Time Calculation (min): 72 min    Short Term Goals: Week 9: OT Short Term Goal 1 (Week 9): Pt will perform toileting tasks with CGA OT Short Term Goal 2 (Week 9): Pt will use LUE as gross assist with min A during functional tasks  Skilled Therapeutic Interventions/Progress Updates:    Pt resting in bed upon arrival. Pt reported her bed was wet from urine. Pt agreeable to shower this morning. Skilled OT services with focus on bed mobility, sitting balance, functional tranfsers, functional amb with hemi walker, bathing at shower level, dressing with sit<>stand from EOB, and safety awareness to increase independence with BADLs. Squat pivot transfer to w/c and to toilet with CGA; min verbal cues for safety and technique. Pt amb with hemi walker to TTB in bathroom with min A when turning to sit on TTB. BAthing with min A. Pt continued to initiate use of LUE in bathing tasks. W/c to EOB. Sit<>stand from EOB with min A using hemi walker. Min verbal cues for hemi dressing techniques. Contnnued improvement in LUE functional use but continues to required mod A as gross assist. Pt remained in bed with all needs within reach. Bed alarm activated.   Therapy Documentation Precautions:  Precautions Precautions: Fall Precaution/Restrictions Comments: L hemi Restrictions Weight Bearing Restrictions Per Provider Order: No Pain:  Pt denies pain this morning   Therapy/Group: Individual Therapy  [name] 04/09/2024, 8:27 AM

## 2024-04-09 NOTE — Progress Notes (Signed)
 PROGRESS NOTE   Subjective/Complaints:  Pt reports had significant bladder incontinence last night- usually has urgency and cannot make it to bathroom, but didn't even know went and "woke up swimming in urine".  Also feels like L eye more open - and L face feels slightly different, but couldn't explain why.   Denies dysuria LBM 2 days ago ROS:    Pt denies SOB, abd pain, CP, N/V/C/D, and vision changes   L hand pain/swelling- somewhat better + vision a little burry-continued-worse when having migraines this weekend  Objective:   No results found.      No results for input(s): "WBC", "HGB", "HCT", "PLT" in the last 72 hours.         No results for input(s): "NA", "K", "CL", "CO2", "GLUCOSE", "BUN", "CREATININE", "CALCIUM " in the last 72 hours.          Intake/Output Summary (Last 24 hours) at 04/09/2024 0759 Last data filed at 04/08/2024 0909 Gross per 24 hour  Intake 118 ml  Output --  Net 118 ml            Physical Exam: Vital Signs Blood pressure 119/80, pulse 80, temperature 98.6 F (37 C), resp. rate 18, height 5\' 6"  (1.676 m), weight 72.3 kg, SpO2 90%.        General: awake, alert, appropriate, on toilet with OT in room; NAD HENT: R eye squinting, L eye ?not able to squint?- more open intermittently- no facial droop- facial sensation intact-  CV: regular rate and rhythm; no JVD Pulmonary: CTA B/L; no W/R/R- good air movement GI: soft, NT, ND, (+)BS- hypoactive Psychiatric: appropriate Neurological: Ox3- able to stay on track in  conversation better- same dysarthria   MAS of 1- 1+ in L wrist and hand/fingers MSK-L hand Less TTP- less swelling- not wearing glove Neuro: cognitively at baseline. Alert. Sl dysarthria. CN without changes. Able to count fingers. MAS of 0-1 in LUE Extremities: L hand - less swelling and using to help feed herself Motor strength 0/5 LUE 3-/5  LLE 5/5 on right side  Skin- L PIP of 2nd digit- healing scab     Assessment/Plan: 1. Functional deficits which require 3+ hours per day of interdisciplinary therapy in a comprehensive inpatient rehab setting. Physiatrist is providing close team supervision and 24 hour management of active medical problems listed below. Physiatrist and rehab team continue to assess barriers to discharge/monitor patient progress toward functional and medical goals  Care Tool:  Bathing    Body parts bathed by patient: Left arm, Chest, Abdomen, Front perineal area, Buttocks, Right upper leg, Left upper leg, Right lower leg, Left lower leg, Face   Body parts bathed by helper: Right arm Body parts n/a: Right arm   Bathing assist Assist Level: Minimal Assistance - Patient > 75%     Upper Body Dressing/Undressing Upper body dressing   What is the patient wearing?: Pull over shirt    Upper body assist Assist Level: Supervision/Verbal cueing    Lower Body Dressing/Undressing Lower body dressing      What is the patient wearing?: Pants, Underwear/pull up     Lower body assist Assist for lower body dressing: Minimal Assistance -  Patient > 75%     Toileting Toileting    Toileting assist Assist for toileting: Moderate Assistance - Patient 50 - 74%     Transfers Chair/bed transfer  Transfers assist     Chair/bed transfer assist level: Contact Guard/Touching assist     Locomotion Ambulation   Ambulation assist      Assist level: Minimal Assistance - Patient > 75% Assistive device: Walker-hemi Max distance: 146   Walk 10 feet activity   Assist     Assist level: Minimal Assistance - Patient > 75% Assistive device: Walker-hemi   Walk 50 feet activity   Assist Walk 50 feet with 2 turns activity did not occur: Safety/medical concerns (L hemiplegia and pt fatigue)  Assist level: Minimal Assistance - Patient > 75% Assistive device: Walker-hemi    Walk 150 feet  activity   Assist Walk 150 feet activity did not occur: Safety/medical concerns (L hemiplegia and pt fatigue)  Assist level: Minimal Assistance - Patient > 75% Assistive device: Walker-Eva    Walk 10 feet on uneven surface  activity   Assist Walk 10 feet on uneven surfaces activity did not occur: Safety/medical concerns (L hemiplegia and pt fatigue)         Wheelchair     Assist Is the patient using a wheelchair?: Yes Type of Wheelchair: Manual    Wheelchair assist level: Supervision/Verbal cueing Max wheelchair distance: 50    Wheelchair 50 feet with 2 turns activity    Assist        Assist Level: Supervision/Verbal cueing   Wheelchair 150 feet activity     Assist      Assist Level: Minimal Assistance - Patient > 75%   Blood pressure 119/80, pulse 80, temperature 98.6 F (37 C), resp. rate 18, height 5\' 6"  (1.676 m), weight 72.3 kg, SpO2 90%.   Medical Problem List and Plan: 1. 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 Search for facility ongoing. May only be able to go to custodial care facility  D/c SNF-  Con't CIR PT, OT and SLP- still has safety awareness deficits, but hard ot tell if it's her personality or stroke  Will d/c topamax  completely 2. DVT/anticoagulation:  Pharmaceutical: Lovenox              -antiplatelet therapy: Aspirin  81 mg daily and Plavix  75 mg daily x 3 weeks then Plavix  alone 3. Pain Management: Tylenol  as needed  3/25- denies pain- con't regimen rpn   3-30: Ongoing left hip pain; Voltaren  gel and Tylenol .  4/3- having new burning pain in LUE- which is seen sometimes with stroke- will add Duloxetine  30 mg daily for nerve pain- monitor for side effects.  -if  gets nausea, will change to Lyrica  4/4-6 no side effects so far-  4/8- will increase Duloxetine  to 60 mg daily  DC hydrocodone  due to side effects, trial tyl #3  as needed for pain that is not relieved by Tylenol . 4/28- advised pt to use voltaren  gel  on all sides of L hand including between fingers- use tylenol  #3 as needed 4/29- pt admits doesn't like taking/using meds all the time- explained I don't have a "fix"- to  make it better- have to take meds to have them work 4/30- encouraged again to use voltaren  4x/day for L hand 5/1- pt said using voltaren  gel and tylenol  #3 as needed- adding lidoderm  patches 2 patches at night for back pain 5/2- HA's- will restart SCHEDULED Topamax  75 mg at bedtime- not sure what occurred? Needs to get nightly 5/9- hasn't asked for pain meds this Am including tylenol , so wants to not register the complaint" yet today.  5/11- Add Lyrica  50 mg BID for nerve pain in L hand 5/13- changed to q6pm since pt feels it affected her vision-made it worse 5/14- said pain better today- not clear if due to prednisone  vs Lyrica ? 5/15 pain much better in L hand-  pt feels it's gout? It might be 5/16- hand pain slightly better with ACE wrap overnight- which is fine 5/20- pt reports having migraines every AM, but refusing topamax  last few days- at least 3- will go back and d/w pt and NOT make changes as we discussed 5/21- will reduce Topamax  to 25 mg at bedtime to help with feelinga little confused 5/22- Will stop Topamax  since made her so "loopy"- pt doesn't want any other meds to try to help HA's- said will take Tylenol  4. Depression/Behavior/Sleep: Provide emotional support  4/21 mood improved--continue duloxetine              -antipsychotic agents: N/A 5. Neuropsych/cognition: This patient is capable of making decisions on her own behalf. 6. Skin/Wound Care: Routine skin checks 7. Fluids/Electrolytes/Nutrition:encouraging po.   5/12- I personally reviewed the patient's labs  today.     -encouraging PO 8.  Dysphagia.  Dysphagia 3 with thins currently  4/18 intake ok 5/11-5/13- advised pt to take smaller bites-  intake good, but takes too big of bites    5/14- switched back to full supervision- pt unhappy, but wasn't listening when I discussed more of how to eat/take bites 5/16- taking smaller bites with full supervision 5/18 eating 65 to 95% of her meals today 5/22- Back to full supervision- has to sit completely upright to eat 5/23- if this goes better, can go back to intermittent supervision once SLP decided 9.  Hyperlipidemia.  Crestor  10.  Recent History of cocaine/tobacco use.  UDS positive for cocaine.  Provide counseling, this has been a chronic issue , seen in PCP notes 11.  Hypothyroidism. continue Synthroid   12.  Constipation.   Senokot-S 2 tab twice daily  5/6- LBM yesterday even though chart says 5/3  5/7- will add Miralax - esp since increased Duloxetine  which can cause constipation- already on Senna 2 tabs BID  5/9- LBM documented as yesterday- medium BM  5.10- LBM 2 days ago- thinks can go today  LBM 5/12  5/15- LBM yesterday  5/17 Increase miralax  to BID, add sorbitol  PRN  5/18 appears she has been refusing miralax , will check abd xray, suspect constipation. sorbitol   5/19- up all night having Bms, but cleaned out  5/20- LBM yesterday  5/22- will d/c Miralax - not taking- and con't Senna  5/23- trying ot have BM on toilet- pt might need intervention if doesn't go by tomorrow 15. Blurry vision and dysconjugate gaze  3/27- will need Ophtho after d/c.   4/4- pt reports everything blurry- cannot see faces as a result- never had eye checked in past- used readers but cannot see at a distance since stroke- will send to Ophtho after d/c.   4/7- d/w pt again- she wants eyeglasses- explained we cannot get when in hospital-  also per guidelines, since vision can still change, to not get for 3 months after stroke. She disbelieves it's from stroke, since had  2 prior- that didn't cause vision changes- we educated pt pn how this can occur.   4/25- reminded pt cannot get Optho in hospital- need to get once d/c'd  4/30 went over visual changes with pt- will need to get glasses, likely prisms 2-3 months after d/c once vision stabilizes  5/6- d/w pt again- she wants to see Optho- explained they do not come into hospital for this  5/18 continued blurry vision, discussed outpatient f/u  5/23- pt having blurry vision again this AM, she says slightly worse- will con't to monitor 16.  Left hip pain.  -3-29 x-ray showing moderate degenerative joint disease of the hip; no fracture  -Continue Voltaren  gel to hip 4 times daily  -May benefit from steroid injection as an outpatient  3/31- started Vit D and Ca per pt request  - improved to an extent  4/24- Pt reports L hip still hurts when stands/works with therapy however don't want her on opiates regularly due to can impair sensorium and she also ready has issues  5/20- pt refusing voltaren  gel 17. Severe HA-daily HA's  4/15-16 sx appear improved with topamax , tylenol  helps also   -may use sx to get out of therapy at times?  4/23- Per team, pt having HA's frequently which impairs therapy- she won't discuss HA's with me- unclear if only has a day goes on vs self limting behavior  4/24- increase to 75 mg at bedtime  4/25- still having HA's, actually thinks a little worse since not taking Norco for it-   4/28-4/29 fewer complaints about HA this AM  4/30-5/1- - per staff, c/o these less- not interfering with therapy as much  5/2- Wasn't getting topamax - just taking T#3- wil restart Topamax - don't know what occurred with order  5/6- HA's doing better  5/8- not requiring T#3 anymore lately per pt  5/13- Severe HA 5/11 in afternoon- took T#3  5/17 headache today does not sound severe, she has not taken her Tylenol  3 yet but nursing is getting it for her  5/18 headache improved with Tylenol  No. 3 today  5/20-  refusing topamax - for past 3+ days- will go back and d/w pt 18. Decreased safety awareness/impulsiveness  4/30- still requiring max cues  5/7- 5/10- no change-per therapy- still requiring max cues  5/23- Still requiring mod-max cues for safety 20. Spasticity  4/22- off baclofen , and c/o LUE pain- wondering how much of her complaints are due to tone?-   4/30- Spasticity improved in LUE- MAS of 0 actually this AM  5/11-12- Getting some spasticity vs disuse tightness inL hand- fingers esp 3rd/4th DIPs- might need Botox after d/c  5/14- MAS of 1 to 1+ in L wrist and fingers- not proximally 21. 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 .   4/30- pt pulled scab off-  22.  Urinary  and bowel incont due to CVA, cont toileting program 4/23- still cannot control B/B 4/30- on toileting program- is getting somewhat better, but still incontinent more at night  5/13- still incontinent at night  5/18 continued incontinence  23. 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   4/29- will get Dopplers, because pt says she elevated hand overnight- still swollen- ordered  4/30- Dopplers (-)- will get isotoner compression glove  5/4 received glove  5/6- Swelling ~ 50% better- will increase Duloxetine  to 120 mg daily for nerve pain in L hand and mood.   5/7- hand sore this AM- but not "killing her".   5/8- pain great this AM- doesn't need meds other than tylenol  per pt -sounds like increased cymbalta  has helped  5/9- Some pain in L hand this Am, but hasn't taken tylenol  or T#3 yet this Am- 5/13- Will decrease Lyrica  to 50 mg q6pm  since caused her to have visual changes early in day  5/14- Pain doing better in last 12+ hours- not as painful 24. C/O catch in breath- no SOB  5/9- asked pt to use ICS every 1 hour while awake- since probably is atelectasis  25. Dry eyes  5/10- ordered eye drops as needed for pt 26. Gout? Swelling of L hand  5/13- pt has never had gout but runs in her family- she wants to try something else ot see if would help L hand- will try Colchicine  0.6 mg daily x 4 days as well as Prednisone  20 mg daily x 4 days-  5/14- pt feels pain and swelling are better- not sure if due to gout, or just prednisone  vs power of suggestion- will con't to monitor  5/15- pt feels swelling and pain is better- maybe it is gout? Vs overall improvement with prednisone  27. Vaginal candidiasis  5/15- pt reports Sx's- will give Diflucan  150 mg x1   5/22- no more complaints 28. Worsening urinary incontinence/frequency at night  5/23- will check U/A and Cx   I spent a total of 38   minutes on total care today- >50% coordination of care- due to  D/w nursing about U/A and Cx- due to incontinence and lack of knowledge that she voided. Also d/w OT about pt's function as detailed in note  LOS: 60 days A FACE TO FACE EVALUATION WAS PERFORMED  Gari Trovato 04/09/2024, 7:59 AM

## 2024-04-09 NOTE — Progress Notes (Signed)
 Physical Therapy Session Note  Patient Details  Name: Theresa Watkins MRN: [id number] Date of Birth: 08/03/1966  Today's Date: 04/09/2024 PT Individual Time: 1140-1200 PT Individual Time Calculation (min): 20 min  and Today's Date: 04/09/2024 PT Missed Time: 10 Minutes Missed Time Reason: Patient fatigue  Short Term Goals: Week 8: PT Short Term Goal 1 (Week 8): Pt will initiate stair training PT Short Term Goal 2 (Week 8): Pt will perform floor transfer with CGA or better  Skilled Therapeutic Interventions/Progress Updates:    Pt recd in bed asleep, requesting to sleep but eventually states she is willing to get up. No complaint of pain. Increased time for supine>sit d/t drowsiness and pt refusing any and all help from therapist, cueing or physical assist. ambulatory transfer to bathroom with mod a for safety and balance d/t retropulsion and lateral LOB. Assist for clothing management. Pt handed off to NT at end of session as pt requesting time for BM.  Therapy Documentation Precautions:  Precautions Precautions: Fall Precaution/Restrictions Comments: L hemi Restrictions Weight Bearing Restrictions Per Provider Order: No General: PT Amount of Missed Time (min): 10 Minutes PT Missed Treatment Reason: Patient fatigue     Therapy/Group: Individual Therapy  Tex Filbert 04/09/2024, 12:14 PM

## 2024-04-09 NOTE — Plan of Care (Signed)
  Problem: RH BOWEL ELIMINATION Goal: RH STG MANAGE BOWEL WITH ASSISTANCE Description: STG Manage Bowel with toileting Assistance. Outcome: Progressing   Problem: RH SAFETY Goal: RH STG ADHERE TO SAFETY PRECAUTIONS W/ASSISTANCE/DEVICE Description: STG Adhere to Safety Precautions With cues Assistance/Device. Outcome: Progressing   Problem: RH PAIN MANAGEMENT Goal: RH STG PAIN MANAGED AT OR BELOW PT'S PAIN GOAL Description: Pain managed < 4 with prns Outcome: Progressing

## 2024-04-09 NOTE — Plan of Care (Signed)
 Goals upgraded to reflect current progress Problem: RH Swallowing Goal: LTG Patient will consume least restrictive diet using compensatory strategies with assistance (SLP) Description: LTG:  Patient will consume least restrictive diet using compensatory strategies with assistance (SLP) Flowsheets (Taken 04/09/2024 0654) LTG: Pt Patient will consume least restrictive diet using compensatory strategies with assistance of (SLP): Modified Independent   Problem: RH Swallowing Goal: LTG Patient will consume least restrictive diet using compensatory strategies with assistance (SLP) Description: LTG:  Patient will consume least restrictive diet using compensatory strategies with assistance (SLP) Flowsheets (Taken 04/09/2024 0654) LTG: Pt Patient will consume least restrictive diet using compensatory strategies with assistance of (SLP): Modified Independent   Problem: RH Expression Communication Goal: LTG Patient will increase speech intelligibility (SLP) Description: LTG: Patient will increase speech intelligibility at word/phrase/conversation level with cues, % of the time (SLP) Flowsheets (Taken 04/09/2024 0654) LTG: Patient will increase speech intelligibility (SLP): Modified Independent Level: Conversation level Percent of time patient will use intelligible speech: 95

## 2024-04-09 NOTE — Progress Notes (Signed)
 Speech Language Pathology Weekly Progress and Session Note  Patient Details  Name: Theresa Watkins MRN: [id number] Date of Birth: 1966-08-10  Beginning of progress report period: Mar 31, 2024 End of progress report period: Apr 09, 2024  Today's Date: 04/09/2024 SLP Individual Time: 4098-1191 SLP Individual Time Calculation (min): 45 min  Short Term Goals: Week 8: SLP Short Term Goal 1 (Week 8): Patient will communicate complex information with 90% intelligibility and mod iA SLP Short Term Goal 1 - Progress (Week 8): Met SLP Short Term Goal 2 (Week 8): Patient will utilize safe swallowing strategies during consumption of regular/thin diet with mod i multimodal A SLP Short Term Goal 2 - Progress (Week 8): Met    New Short Term Goals: Week 8: SLP Short Term Goal 1 (Week 9): Patient will communicate complex information with 90% intelligibility and mod iA SLP Short Term Goal 2 (Week 9): Patient will utilize safe swallowing strategies during consumption of regular/thin diet with mod i multimodal A  Weekly Progress Updates: Pt has made excellent gains and has met 2 of 2 STGs this reporting period due to improved use of safe swallowing strategies and speech intelligibility. Currently, patient continues to require supervision A for use of SLOP (slow, loud, over articulate, pause) at the conversational level to reach 90% intelligibility. Patient continues to require superpvisionA to utilize safe swallowing exercises/strategies to reduce s/sx of aspiration including small bites/sips, slow rate and sitting upright at 90 degrees.  Pt/family eduction ongoing. Pt would benefit from continued ST intervention to maximize communication and dysphagia in order to maximize functional independence at d/c.    Intensity: Minumum of 1-2 x/day, 30 to 90 minutes Frequency: 1 to 3 out of 7 days Duration/Length of Stay: TBD Treatment/Interventions: Multimodal communication approach;Speech/Language  facilitation;Functional tasks;Therapeutic Activities;Therapeutic Exercise;Internal/external aids;Dysphagia/aspiration precaution training;Patient/family education   Daily Session  Skilled Therapeutic Interventions:  Skilled therapy session focused on communication goals. SLP facilitated session by prompting patient to complete x25 repetitions of EMST set at 35cm H2O. Patient completed with supervisionA. SLP then reviewed speech intelligibility strategies and encouraged use at the conversational level. This date, patient reached 90% intelligibility at the structured and unstructured conversational level. Patient left in bed with call bell in reach. Continue POC.       Pain None reported   Therapy/Group: Individual Therapy  Shela Esses M.A., CCC-SLP 04/09/2024, 6:54 AM

## 2024-04-10 LAB — URINE CULTURE: Culture: NO GROWTH

## 2024-04-10 MED ORDER — MELATONIN 5 MG PO TABS: 5.0000 mg | ORAL_TABLET | Freq: Every evening | ORAL | Status: DC | PRN

## 2024-04-10 NOTE — Progress Notes (Signed)
 Speech Language Pathology Daily Session Note  Patient Details  Name: Theresa Watkins MRN: [id number] Date of Birth: 03-10-1966  Today's Date: 04/10/2024 SLP Individual Time: 0815-0909 SLP Individual Time Calculation (min): 54 min  Short Term Goals: Week 9: SLP Short Term Goal 1 (Week 9): Patient will communicate complex information with 90% intelligibility and mod iA SLP Short Term Goal 2 (Week 9): Patient will utilize safe swallowing strategies during consumption of regular/thin diet with mod i multimodal A  Skilled Therapeutic Interventions: SLP conducted skilled therapy session targeting communication and dysphagia goals. Upon SLP entry, patient endorses that she hasn't had breakfast due to nursing full supervision schedule. SLP retrieved tray and provided full supervision across all breakfast consistencies included on regular/thin tray. Patient endorses choking episode with oatmeal yesterday and grits earlier in the week, but notes that she never has any difficulty with liquids or regular solids. Patient did exhibit immediate cough with thin liquids x1, however notes that this is not a frequent occurrence. Notably, patient was only partially upright during meal (~70 degrees vs. 90), but was resistant to further repositioning. This may have contributed to singular instance of swallowing difficulty. No further overt difficulty observed across all trials. Patient was at supervision level for use of safe swallowing strategies. Recommend continuation of current diet. In second half of session, targeted use of communication strategies during complex information conveyance via tongue twisters. Patient benefited from supervision cues to utilize loud and pause strategies. Patient was left in room with call bell in reach and alarm set. SLP will continue to target goals per plan of care.        Pain Pain Assessment Pain Scale: 0-10 Pain Score: 8  Pain Location: Head  Therapy/Group: Individual  Therapy  Armanii Pressnell, M.A., CCC-SLP  Jaysiah Marchetta A Jamin Panther 04/10/2024, 9:09 AM

## 2024-04-10 NOTE — Plan of Care (Signed)
  Problem: RH BOWEL ELIMINATION Goal: RH STG MANAGE BOWEL WITH ASSISTANCE Description: STG Manage Bowel with toileting Assistance. Outcome: Progressing   Problem: RH SAFETY Goal: RH STG ADHERE TO SAFETY PRECAUTIONS W/ASSISTANCE/DEVICE Description: STG Adhere to Safety Precautions With cues Assistance/Device. Outcome: Progressing   Problem: RH PAIN MANAGEMENT Goal: RH STG PAIN MANAGED AT OR BELOW PT'S PAIN GOAL Description: Pain managed < 4 with prns Outcome: Progressing

## 2024-04-10 NOTE — Progress Notes (Signed)
 Occupational Therapy Session Note  Patient Details  Name: Theresa Watkins MRN: [id number] Date of Birth: 01/09/1966  Today's Date: 04/10/2024 OT Individual Time: 1050-1200 OT Individual Time Calculation (min): 70 min    Short Term Goals: Week 8: OT Short Term Goal 1 (Week 8): Pt will perfrom toileting tasks with min A OT Short Term Goal 1 - Progress (Week 8): Met OT Short Term Goal 2 (Week 8): Pt will use LUE as gross assist during functional tasks with mod A OT Short Term Goal 2 - Progress (Week 8): Met  Skilled Therapeutic Interventions/Progress Updates:    Patient received supine in bed.  Patient greeted OT with "I need your help to take a bath, and call my nurse to change the bed and wash the mattress it is soaked."  Patient assisted out of bed and out of wet night clothes, then into bathroom.  Patient showered with min assist and cueing to utilize bath sponge.  Added small bend to handle of sponge to help her more effectively reach back.  Patient able to direct her care needs.  Patient completed grooming at sink, then transferred to edge of bed to dress self.  Patient needed cueing to lock her brakes on wheelchair.  Patient attempting to pull on sheets to pull self from sitting position to standing.  Patient has little fear of unsafe conditions.   Patient completes BADL slowly, and in particualr sequence.  Patient will accept suggestions when she requests help, but clearly prefers to complete ADL with her own flow.  Patient left in bed at end of session, with bed alarm engaged and personal items in reach.    Therapy Documentation Precautions:  Precautions Precautions: Fall Precaution/Restrictions Comments: L hemi Restrictions Weight Bearing Restrictions Per Provider Order: No   Pain: Pain Assessment Pain Scale: 0-10 Pain Score: 8  Pain Location: Head        Therapy/Group: Individual Therapy  Shaguana Love M 04/10/2024, 12:47 PM

## 2024-04-10 NOTE — Progress Notes (Signed)
 Occupational Therapy Session Note  Patient Details  Name: CHRISTALYNN BOISE MRN: [id number] Date of Birth: [birth year]  Today's Date: 04/10/2024 OT Individual Time: 0250-0330 OT Individual Time Calculation (min): 40 min    Short Term Goals: Week 1:  OT Short Term Goal 1 (Week 1): Pt will complete PROM with LUE without VC OT Short Term Goal 1 - Progress (Week 1): Progressing toward goal OT Short Term Goal 2 (Week 1): Pt will use LUE as stabilizer during ADL tasks with Mod A OT Short Term Goal 2 - Progress (Week 1): Met OT Short Term Goal 3 (Week 1): Pt will complete shower transfer at Mod A with LRAD OT Short Term Goal 3 - Progress (Week 1): Progressing toward goal OT Short Term Goal 4 (Week 1): Pt will compplete UB dressing with himi technique at Mod A OT Short Term Goal 4 - Progress (Week 1): Met  Skilled Therapeutic Interventions/Progress Updates:    Patient in bed at the time of arrival. Patient indicated that she rested well on last night and that she had no pain to report at the time of treatment. The pt was in agreement with completing core exercises to improve her UB strength for functional gains with BADL related task performance. The pt was able to transfer from bed LOF to EOB with close S.  She was able to sit unsupported EOB for donning her shoes with s/uA.  The the pt was able to transfer from EOB to the w/c by  using the bed rail and the arm of the w/c with CGA.  The pt was able to use her feet to propel the w/c down the hall to the gym > 100 feet with 1 rest break.  The pt was transported the remainder of the way to the gym located on the central hall.  The pt was able to come from sit to stand using the bars of the walking frame 3x  for coming into standing with CGA.  The pt was albe to shift her weight from side to side while weight bearing through BLE for gains in mobility.  The pt went on to complete a simulated task in LB dressing using theraband with MinA for donning the  theraband over her hips and bottom at [facility] following demonstration and initial cues.  The patient went on to roll herself back to her room using BLE to propel forward >50 ft.  The pt was transported back to her room and was able to transfer from w/c LOF to EOB with CGA using a squat pivot transfer. The pt was able to go up in the bed with SBA .  At the end of the session, the call light and bedside table were both placed within reach with all additional needs addressed.   Therapy Documentation Precautions:  Precautions Precautions: Fall Precaution/Restrictions Comments: L hemi Restrictions Weight Bearing Restrictions Per Provider Order: No  Therapy/Group: Individual Therapy  Moises Ang 04/10/2024, 3:35 PM

## 2024-04-11 DIAGNOSIS — I63511 Cerebral infarction due to unspecified occlusion or stenosis of right middle cerebral artery: Secondary | ICD-10-CM | POA: Diagnosis not present

## 2024-04-11 MED ORDER — MELATONIN 5 MG PO TABS
5.0000 mg | ORAL_TABLET | Freq: Every day | ORAL | Status: DC
  Administered 2024-04-11 – 2024-04-16 (×2): 5 mg via ORAL
  Administered 2024-04-20: 2.5 mg via ORAL
  Filled 2024-04-11 (×10): qty 1

## 2024-04-11 NOTE — Progress Notes (Signed)
 Occupational Therapy Session Note  Patient Details  Name: Theresa Watkins MRN: [id number] Date of Birth: 1966-05-04  Today's Date: 04/11/2024 OT Individual Time: 4098-1191 OT Individual Time Calculation (min): 60 min    Short Term Goals: Week 1:  OT Short Term Goal 1 (Week 1): Pt will complete PROM with LUE without VC OT Short Term Goal 1 - Progress (Week 1): Progressing toward goal OT Short Term Goal 2 (Week 1): Pt will use LUE as stabilizer during ADL tasks with Mod A OT Short Term Goal 2 - Progress (Week 1): Met OT Short Term Goal 3 (Week 1): Pt will complete shower transfer at Mod A with LRAD OT Short Term Goal 3 - Progress (Week 1): Progressing toward goal OT Short Term Goal 4 (Week 1): Pt will compplete UB dressing with himi technique at Mod A OT Short Term Goal 4 - Progress (Week 1): Met  Skilled Therapeutic Interventions/Progress Updates:    Patient resting in bed at the time of arrival, patient indicated that she rest well on last night, however, at the time of treatment she reported a pain response of 8 on 0-10  for migriane. Patient went on to say taht she is trying to not rely on the medication as much.  The Patient agreed with completing core strengthening  to improve upon functional Ind.  The Patient was able to donn  her LB clothing items  inclusive of pants and shoes with s/uA.  The pt was able to transfer from EOB to the w/c  with CGA to MinA for maintaining balance using the EOB and the arm of the w/c.  The pt was able to brush her teeth with s/uA.  The pt was transported to the gym and was able come from sit to stand 3x with CGA incorporating the bars of the walking frame  using BUE for additional balance.  The pt was instructed to shift her weight through BUE/BLE for gains in function. The pt went on to grasp a 1LB dowel 3x,  bring the dowel into shld flexion with focus of symmetry with BUE's  while grasp the dowel.  The pt was also instructed to make adjustments in  her  truck positioning for gains in static and dynamic sitting and standing balance. At the end of the treatment session, the pt was transported back to her room and she was able to transfer from the w/c to EOB with close S using the bed and the arm of the w/c .  The pt was able to transition to supine in bed with CGA. The call light and bedside table were placed within reach with all additional needs addressed prior to exiting the room.     Therapy Documentation Precautions:  Precautions Precautions: Fall Precaution/Restrictions Comments: L hemi Restrictions Weight Bearing Restrictions Per Provider Order: No  Therapy/Group: Individual Therapy  Moises Ang 04/11/2024, 12:41 PM

## 2024-04-11 NOTE — Progress Notes (Signed)
 PROGRESS NOTE   Subjective/Complaints: No new complaints this morning Discussed that UC is negative He feels tired after her morning therapy Appreciates melatonin at night, will schedule  ROS:  Pt denies SOB, abd pain, CP, N/V/C/D, and vision changes, +insomnia   L hand pain/swelling- somewhat better + vision a little burry-continued-worse when having migraines this weekend  Objective:   No results found.      No results for input(s): "WBC", "HGB", "HCT", "PLT" in the last 72 hours.         No results for input(s): "NA", "K", "CL", "CO2", "GLUCOSE", "BUN", "CREATININE", "CALCIUM " in the last 72 hours.          Intake/Output Summary (Last 24 hours) at 04/11/2024 1414 Last data filed at 04/11/2024 0824 Gross per 24 hour  Intake 350 ml  Output --  Net 350 ml            Physical Exam: Vital Signs Blood pressure 119/74, pulse 94, temperature 98 F (36.7 C), temperature source Oral, resp. rate 15, height 5\' 6"  (1.676 m), weight 72.3 kg, SpO2 92%.        General: awake, alert, appropriate, on toilet with OT in room; NAD HENT: R eye squinting, L eye ?not able to squint?- more open intermittently- no facial droop- facial sensation intact-  CV: regular rate and rhythm; no JVD Pulmonary: CTA B/L; no W/R/R- good air movement GI: soft, NT, ND, (+)BS- hypoactive Psychiatric: appropriate Neurological: Ox3- able to stay on track in  conversation better- same dysarthria   MAS of 1- 1+ in L wrist and hand/fingers MSK-L hand Less TTP- less swelling- not wearing glove Neuro: cognitively at baseline. Alert. Sl dysarthria. CN without changes. Able to count fingers. MAS of 0-1 in LUE Extremities: L hand - less swelling and using to help feed herself Motor strength 0/5 LUE 3-/5 LLE 5/5 on right side  Skin- L PIP of 2nd digit- healing scab, stable 5/25   Assessment/Plan: 1. Functional deficits which  require 3+ hours per day of interdisciplinary therapy in a comprehensive inpatient rehab setting. Physiatrist is providing close team supervision and 24 hour management of active medical problems listed below. Physiatrist and rehab team continue to assess barriers to discharge/monitor patient progress toward functional and medical goals  Care Tool:  Bathing    Body parts bathed by patient: Left arm, Chest, Abdomen, Front perineal area, Buttocks, Right upper leg, Left upper leg, Right lower leg, Left lower leg, Face   Body parts bathed by helper: Right arm Body parts n/a: Right arm   Bathing assist Assist Level: Minimal Assistance - Patient > 75%     Upper Body Dressing/Undressing Upper body dressing   What is the patient wearing?: Pull over shirt    Upper body assist Assist Level: Supervision/Verbal cueing    Lower Body Dressing/Undressing Lower body dressing      What is the patient wearing?: Pants, Underwear/pull up     Lower body assist Assist for lower body dressing: Minimal Assistance - Patient > 75%     Toileting Toileting    Toileting assist Assist for toileting: Minimal Assistance - Patient > 75%     Transfers Chair/bed  transfer  Transfers assist     Chair/bed transfer assist level: Contact Guard/Touching assist     Locomotion Ambulation   Ambulation assist      Assist level: Minimal Assistance - Patient > 75% Assistive device: Walker-hemi Max distance: 80   Walk 10 feet activity   Assist     Assist level: Minimal Assistance - Patient > 75% Assistive device: Walker-hemi   Walk 50 feet activity   Assist Walk 50 feet with 2 turns activity did not occur: Safety/medical concerns (L hemiplegia and pt fatigue)  Assist level: Minimal Assistance - Patient > 75% Assistive device: Walker-hemi    Walk 150 feet activity   Assist Walk 150 feet activity did not occur: Safety/medical concerns (L hemiplegia and pt fatigue)  Assist level:  Minimal Assistance - Patient > 75% Assistive device: Walker-Eva    Walk 10 feet on uneven surface  activity   Assist Walk 10 feet on uneven surfaces activity did not occur: Safety/medical concerns (L hemiplegia and pt fatigue)         Wheelchair     Assist Is the patient using a wheelchair?: Yes Type of Wheelchair: Manual    Wheelchair assist level: Supervision/Verbal cueing Max wheelchair distance: 150    Wheelchair 50 feet with 2 turns activity    Assist        Assist Level: Supervision/Verbal cueing   Wheelchair 150 feet activity     Assist      Assist Level: Supervision/Verbal cueing   Blood pressure 119/74, pulse 94, temperature 98 F (36.7 C), temperature source Oral, resp. rate 15, height 5\' 6"  (1.676 m), weight 72.3 kg, SpO2 92%.   Medical Problem List and Plan: 1. 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 Search for facility ongoing. May only be able to go to custodial care facility  D/c SNF-  Con't CIR PT, OT and SLP- still has safety awareness deficits, but hard ot tell if it's her personality or stroke  Will d/c topamax  completely 2. DVT/anticoagulation:  Pharmaceutical: Lovenox              -antiplatelet therapy: Aspirin  81 mg daily and Plavix  75 mg daily x 3 weeks then Plavix  alone 3. Pain Management: Tylenol  as needed  3/25- denies pain- con't regimen rpn   3-30: Ongoing left hip pain; Voltaren  gel and Tylenol .  4/3- having new burning pain in LUE- which is seen sometimes with stroke- will add Duloxetine  30 mg daily for nerve pain- monitor for side effects.  -if gets nausea, will change to Lyrica  4/4-6 no side effects so far-  4/8- will increase Duloxetine  to 60 mg daily   DC hydrocodone  due to side effects, trial tyl #3  as needed for pain that is not relieved by Tylenol . 4/28- advised pt to use voltaren  gel  on all sides of L hand including between fingers- use tylenol  #3 as needed 4/29- pt admits doesn't like taking/using meds all the time- explained I don't have a "fix"- to make it better- have to take meds to have them work 4/30- encouraged again to use voltaren  4x/day for L hand 5/1- pt said using voltaren  gel and tylenol  #3 as  needed- adding lidoderm  patches 2 patches at night for back pain 5/2- HA's- will restart SCHEDULED Topamax  75 mg at bedtime- not sure what occurred? Needs to get nightly 5/9- hasn't asked for pain meds this Am including tylenol , so wants to not register the complaint" yet today.  5/11- Add Lyrica  50 mg BID for nerve pain in L hand 5/13- changed to q6pm since pt feels it affected her vision-made it worse 5/14- said pain better today- not clear if due to prednisone  vs Lyrica ? 5/15 pain much better in L hand-  pt feels it's gout? It might be 5/16- hand pain slightly better with ACE wrap overnight- which is fine 5/20- pt reports having migraines every AM, but refusing topamax  last few days- at least 3- will go back and d/w pt and NOT make changes as we discussed 5/21- will reduce Topamax  to 25 mg at bedtime to help with feelinga little confused 5/22- Will stop Topamax  since made her so "loopy"- pt doesn't want any other meds to try to help HA's- said will take Tylenol  4. Depression/Behavior/Sleep: Provide emotional support  4/21 mood improved--continue duloxetine              -antipsychotic agents: N/A 5. Neuropsych/cognition: This patient is capable of making decisions on her own behalf. 6. Skin/Wound Care: Routine skin checks 7. Fluids/Electrolytes/Nutrition:encouraging po.   5/12- I personally reviewed the patient's labs today.     -encouraging PO 8.  Dysphagia.  Dysphagia 3 with thins currently  4/18 intake ok 5/11-5/13- advised  pt to take smaller bites-  intake good, but takes too big of bites    5/14- switched back to full supervision- pt unhappy, but wasn't listening when I discussed more of how to eat/take bites 5/16- taking smaller bites with full supervision 5/18 eating 65 to 95% of her meals today 5/22- Back to full supervision- has to sit completely upright to eat 5/23- if this goes better, can go back to intermittent supervision once SLP decided 9.  Hyperlipidemia.  Continue Crestor  10.  Recent History of cocaine/tobacco use.  UDS positive for cocaine.  Provide counseling, this has been a chronic issue , seen in PCP notes 11.  Hypothyroidism. continue Synthroid   12.  Constipation.   Senokot-S 2 tab twice daily  5/6- LBM yesterday even though chart says 5/3  5/7- will add Miralax - esp since increased Duloxetine  which can cause constipation- already on Senna 2 tabs BID  5/9- LBM documented as yesterday- medium BM  5.10- LBM 2 days ago- thinks can go today  LBM 5/12  5/15- LBM yesterday  5/17 Increase miralax  to BID, add sorbitol  PRN  5/18 appears she has been refusing miralax , will check abd xray, suspect constipation. sorbitol   5/19- up all night having Bms, but cleaned out  5/20- LBM yesterday  5/22- will d/c Miralax - not taking- and con't Senna  5/23- trying ot have BM on toilet- pt might need intervention if doesn't go by tomorrow  15. Blurry vision and dysconjugate gaze  3/27- will need Ophtho after d/c.   4/4- pt reports everything blurry- cannot see faces as a result- never had eye checked in past- used readers but cannot see at a distance since stroke- will send to Ophtho after d/c.   4/7- d/w pt again- she wants eyeglasses- explained we cannot get when in hospital- also per guidelines, since vision can still change, to not get for 3 months after stroke. She disbelieves it's from stroke, since had 2 prior- that didn't cause vision  changes- we educated pt pn how this can occur.   4/25- reminded pt  cannot get Optho in hospital- need to get once d/c'd  4/30 went over visual changes with pt- will need to get glasses, likely prisms 2-3 months after d/c once vision stabilizes  5/6- d/w pt again- she wants to see Optho- explained they do not come into hospital for this  5/18 continued blurry vision, discussed outpatient f/u  5/23- pt having blurry vision again this AM, she says slightly worse- will con't to monitor  16.  Left hip pain.  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 Severe HA-daily HA's  4/15-16 sx appear improved with topamax , tylenol  helps also   -may use sx to get out of therapy at times?  4/23- Per team, pt having HA's frequently which impairs therapy- she won't discuss HA's with me- unclear if only has a day goes on vs self limting behavior  4/24- increase to 75 mg at bedtime  4/25- still having HA's, actually thinks a little worse since not taking Norco for it-   4/28-4/29 fewer complaints about HA this AM  4/30-5/1- - per staff, c/o these less- not interfering with therapy as much  5/2- Wasn't getting topamax - just taking T#3- wil restart Topamax - don't know what occurred with order  5/6- HA's doing better  5/8- not requiring T#3 anymore lately per pt  5/13- Severe HA 5/11 in afternoon- took T#3  5/17 headache today does not sound severe, she has not taken her Tylenol  3 yet but nursing is getting it for her  5/18 headache improved with Tylenol  No. 3 today  5/20- refusing topamax - for past 3+ days- will go back and d/w pt 18. Decreased safety  awareness/impulsiveness  4/30- still requiring max cues  5/7- 5/10- no change-per therapy- still requiring max cues  5/23- Still requiring mod-max cues for safety 20. Spasticity  4/22- off baclofen , and c/o LUE pain- wondering how much of her complaints are due to tone?-   4/30- Spasticity improved in LUE- MAS of 0 actually this AM  5/11-12- Getting some spasticity vs disuse tightness inL hand- fingers esp 3rd/4th DIPs- might need Botox after d/c  5/14- MAS of 1 to 1+ in L wrist and fingers- not proximally  21. 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 .   4/30- pt pulled scab off-  22.  Urinary  and bowel incont due to CVA, cont toileting program 4/23- still cannot control B/B 4/30- on toileting program- is getting somewhat better, but still incontinent more at night  5/13- still incontinent at night  5/18 continued incontinence   23. 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   4/29- will get Dopplers, because pt says she elevated hand overnight- still swollen- ordered  4/30- Dopplers (-)- will get isotoner compression glove  5/4 received glove  5/6- Swelling ~ 50% better- will increase Duloxetine  to 120 mg daily for nerve pain in L hand and mood.   5/7- hand sore this AM- but not "killing her".   5/8- pain great this AM- doesn't need meds other than tylenol  per pt -sounds like increased cymbalta  has helped  5/9- Some pain in L hand this Am, but hasn't taken tylenol  or T#3 yet this Am- 5/13- Will decrease Lyrica  to 50 mg q6pm since caused her to have visual changes early in day  5/14- Pain doing better in  last 12+ hours- not as painful  24. C/O catch in breath- no SOB  5/9- asked pt to use ICS every 1 hour while awake- since probably is atelectasis   25. Dry eyes Continue  eye drops as needed for pt 26. Gout? Swelling of L hand  5/13- pt has never had gout but runs in her family- she wants to try something else ot see if would help L hand- will try Colchicine  0.6 mg daily x 4 days as well as Prednisone  20 mg daily x 4 days-  5/14- pt feels pain and swelling are better- not sure if due to gout, or just prednisone  vs power of suggestion- will con't to monitor  5/15- pt feels swelling and pain is better- maybe it is gout? Vs overall improvement with prednisone   27. Vaginal candidiasis  5/15- pt reports Sx's- will give Diflucan  150 mg x1   5/22- no more complaints  28. Worsening urinary incontinence/frequency at night  Discussed that UA and UC are negative    LOS: 62 days A FACE TO FACE EVALUATION WAS PERFORMED  Lavell Portugal P Abimael Zeiter 04/11/2024, 2:14 PM

## 2024-04-11 NOTE — Progress Notes (Signed)
 Speech Language Pathology Daily Session Note  Patient Details  Name: Theresa Watkins MRN: [id number] Date of Birth: 27-Dec-1965  Today's Date: 04/11/2024 SLP Individual Time: 1300-1400 SLP Individual Time Calculation (min): 60 min  Short Term Goals: Week 9: SLP Short Term Goal 1 (Week 9): Patient will communicate complex information with 90% intelligibility and mod iA SLP Short Term Goal 2 (Week 9): Patient will utilize safe swallowing strategies during consumption of regular/thin diet with mod i multimodal A    Skilled Therapeutic Interventions:  Pt lying in bed upon SLP arrival, reported 8/10 general pain but was agreeable to ST session. Pt completed EMST exercises (5 sets of 5 at 35-40) with a reported difficulty of 7/10. She required mod verbal cues to take deep breaths prior to exhaling to increase success of exercises. SLP then facilitated structured conversational speech tasks and monitored generalization in unstructured conversation. Pt recalled 3 of 4 speech strategies (SLOP) independently, increased to 4 of 4 when provided min verbal cueing. Pt demonstrated 100% intelligibility during structured tasks but her intelligibility dropped to 70-80% during unstructured conversation d/t imprecise articulation. Pt benefited from mod verbal cueing to increase vocal intensity and use diaphragmatic breathing. Pt left in bed with alarm activated and call bell in reach. Continue ST POC.     Pain Pain Assessment Pain Scale: 0-10 Pain Score: 8   Therapy/Group: Individual Therapy  [name] 04/11/2024, 4:53 PM

## 2024-04-11 NOTE — Progress Notes (Signed)
 Physical Therapy Session Note  Patient Details  Name: Theresa Watkins MRN: [id number] Date of Birth: 1966/10/16  Today's Date: 04/11/2024 PT Individual Time: 1117-1200 PT Individual Time Calculation (min): 43 min   Short Term Goals: Week 5:  PT Short Term Goal 1 (Week 5): =LTGs d/t ELOS PT Short Term Goal 1 - Progress (Week 5): Met  Skilled Therapeutic Interventions/Progress Updates: Pt presents asleep in bed and increased time fully awaken, but agreeable to therapy.  Pt transfers to sitting EOB w/ increased time and min A to fully complete.  Pt attempts to scoot out below the siderail before correcting.  Pt attempts to stand by reaching for the [facility].  Pt requires min A and increased time.  Pt amb to w/c w/ HW and min A but cues for performing each step or would fall to L.  Pt wheeled self to dayroom w/ BLES and occasional RUE, but very distractible in hallway, talking w/ everyone that passes.  Pt transfers sit to stand w/ min A and amb x 80' w/ HW and min A w/ cueing for sequencing but occasional LOB to L, cues provided for [facility] placement (esp during turns) and weight acceptance to LLE.  Pt amb into room w/ HW and in a hurry to get to the bed.  Pt transferred sit to supine w/ supervision.  Bed alarm on and all needs in reach.     Therapy Documentation Precautions:  Precautions Precautions: Fall Precaution/Restrictions Comments: L hemi Restrictions Weight Bearing Restrictions Per Provider Order: No General:   Vital Signs:   Pain:0/10 Pain Assessment Pain Scale: 0-10 Pain Score: 0-No pain ssment :    Balance:   Exercises:   Other Treatments:      Therapy/Group: Individual Therapy  Jolayne Branson P Jerrie Schussler 04/11/2024, 12:20 PM

## 2024-04-11 NOTE — Progress Notes (Signed)
   04/11/24 1619  What Happened  Was fall witnessed? No  Was patient injured? No  Patient found other (Comment) (In bed)  Found by Staff-comment  Stated prior activity ambulating-unassisted  Provider Notification  Provider Name/Title Alessandra Ancona, MD  Date Provider Notified 04/11/24  Time Provider Notified 1630  Method of Notification Call  Notification Reason Fall  Provider response No new orders  Date of Provider Response 04/11/24  Time of Provider Response 1630  Follow Up  Family notified Yes - comment  Adult Fall Risk Assessment  Risk Factor Category (scoring not indicated) High fall risk per protocol (document High fall risk)  Age 58  Fall History: Fall within 6 months prior to admission 5  Elimination; Bowel and/or Urine Incontinence 0  Elimination; Bowel and/or Urine Urgency/Frequency 2  Medications: includes PCA/Opiates, Anti-convulsants, Anti-hypertensives, Diuretics, Hypnotics, Laxatives, Sedatives, and Psychotropics 3  Patient Care Equipment 0  Mobility-Assistance 2  Mobility-Gait 2  Mobility-Sensory Deficit 0  Altered awareness of immediate physical environment 0  Impulsiveness 0  Lack of understanding of one's physical/cognitive limitations 0  Total Score 15  Patient Fall Risk Level High fall risk  Adult Fall Risk Interventions  Required Bundle Interventions *See Row Information* High fall risk - low, moderate, and high requirements implemented  Additional Interventions Use of appropriate toileting equipment (bedpan, BSC, etc.)  Fall intervention(s) refused/Patient educated regarding refusal Bed alarm;Nonskid socks;Yellow bracelet  Screening for Fall Injury Risk (To be completed on HIGH fall risk patients) - Assessing Need for Floor Mats  Risk For Fall Injury- Criteria for Floor Mats Admitted as a result of a fall  Will Implement Floor Mats Yes  Vitals  Temp 98.2 F (36.8 C)  Temp Source Oral  BP (!) 137/93  MAP (mmHg) 107  BP Location Right Arm  BP Method  Automatic  Patient Position (if appropriate) Lying  Pulse Rate 86  Pulse Rate Source Monitor  Resp 17  Oxygen Therapy  SpO2 94 %  O2 Device Room Air  Pain Assessment  Pain Scale 0-10  Pain Score 7  Pain Type Acute pain  Pain Location Head  Pain Descriptors / Indicators Headache  Pain Frequency Constant  Pain Onset On-going  Pain Intervention(s) Medication (See eMAR)  Neurological  Neuro (WDL) X  Level of Consciousness Alert  Orientation Level Oriented X4  Cognition Appropriate at baseline  Speech Slurred/Dysarthria  Musculoskeletal  Musculoskeletal (WDL) X  Assistive Device Wheelchair  Generalized Weakness Yes  Weight Bearing Restrictions Per Provider Order No  Musculoskeletal Details  RUE Full movement  LUE Limited movement;Weakness  RLE Full movement  LLE Limited movement;Weakness  Integumentary  Integumentary (WDL) WDL  Skin Color Appropriate for ethnicity  Skin Condition Dry  Skin Integrity Intact  Skin Turgor Non-tenting

## 2024-04-12 DIAGNOSIS — I63511 Cerebral infarction due to unspecified occlusion or stenosis of right middle cerebral artery: Secondary | ICD-10-CM | POA: Diagnosis not present

## 2024-04-12 LAB — CBC WITH DIFFERENTIAL/PLATELET
Abs Immature Granulocytes: 0.04 10*3/uL (ref 0.00–0.07)
Basophils Absolute: 0 10*3/uL (ref 0.0–0.1)
Basophils Relative: 0 %
Eosinophils Absolute: 0.1 10*3/uL (ref 0.0–0.5)
Eosinophils Relative: 3 %
HCT: 45 % (ref 36.0–46.0)
Hemoglobin: 14.7 g/dL (ref 12.0–15.0)
Immature Granulocytes: 1 %
Lymphocytes Relative: 33 %
Lymphs Abs: 1.9 10*3/uL (ref 0.7–4.0)
MCH: 28.4 pg (ref 26.0–34.0)
MCHC: 32.7 g/dL (ref 30.0–36.0)
MCV: 87 fL (ref 80.0–100.0)
Monocytes Absolute: 0.6 10*3/uL (ref 0.1–1.0)
Monocytes Relative: 10 %
Neutro Abs: 3 10*3/uL (ref 1.7–7.7)
Neutrophils Relative %: 53 %
Platelets: 172 10*3/uL (ref 150–400)
RBC: 5.17 MIL/uL — ABNORMAL HIGH (ref 3.87–5.11)
RDW: 13.3 % (ref 11.5–15.5)
WBC: 5.7 10*3/uL (ref 4.0–10.5)
nRBC: 0 % (ref 0.0–0.2)

## 2024-04-12 NOTE — Progress Notes (Signed)
 Physical Therapy Note  Patient Details  Name: SHREENA BAINES MRN: [id number] Date of Birth: 1966/07/26 Today's Date: 04/12/2024    Pt's plan of care adjusted to QD after speaking with care team and discussed with MD in team conference as pt currently unable to tolerate current therapy schedule with OT, PT, and SLP.     Tex Filbert 04/12/2024, 3:36 PM

## 2024-04-12 NOTE — Progress Notes (Signed)
 PROGRESS NOTE   Subjective/Complaints:   Took melatonin to sleep- slet great.  Worked out while eating with OT.  LBM yesterday.    ROS:    Pt denies SOB, abd pain, CP, N/V/C/D, and vision changes  L hand pain/swelling- somewhat better + vision a little burry-continued-worse when having migraines this weekend  Objective:   No results found.      Recent Labs    04/12/24 0543  WBC 5.7  HGB 14.7  HCT 45.0  PLT 172           No results for input(s): "NA", "K", "CL", "CO2", "GLUCOSE", "BUN", "CREATININE", "CALCIUM " in the last 72 hours.          Intake/Output Summary (Last 24 hours) at 04/12/2024 1004 Last data filed at 04/12/2024 0850 Gross per 24 hour  Intake 338.5 ml  Output --  Net 338.5 ml            Physical Exam: Vital Signs Blood pressure (!) 124/57, pulse 75, temperature 98.5 F (36.9 C), temperature source Oral, resp. rate 17, height 5\' 6"  (1.676 m), weight 72.3 kg, SpO2 93%.         General: awake, alert, appropriate, sitting up slightly in bed; NAD HENT: conjugate gaze; oropharynx moist CV: regular rate and rhythm; no JVD Pulmonary: CTA B/L; no W/R/R- good air movement GI: soft, NT, ND, (+)BS Psychiatric: appropriate Neurological: Ox3- less dysarthria   MAS of 1- 1+ in L wrist and hand/fingers MSK-L hand Less TTP- less swelling- not wearing glove Neuro: cognitively at baseline. Alert. Sl dysarthria. CN without changes. Able to count fingers. MAS of 0-1 in LUE Extremities: L hand - less swelling and using to help feed herself Motor strength 0/5 LUE 3-/5 LLE 5/5 on right side  Skin- L PIP of 2nd digit- healing scab, stable 5/25   Assessment/Plan: 1. Functional deficits which require 3+ hours per day of interdisciplinary therapy in a comprehensive inpatient rehab setting. Physiatrist is providing close team supervision and 24 hour management of active medical  problems listed below. Physiatrist and rehab team continue to assess barriers to discharge/monitor patient progress toward functional and medical goals  Care Tool:  Bathing    Body parts bathed by patient: Left arm, Chest, Abdomen, Front perineal area, Buttocks, Right upper leg, Left upper leg, Right lower leg, Left lower leg, Face   Body parts bathed by helper: Right arm Body parts n/a: Right arm   Bathing assist Assist Level: Minimal Assistance - Patient > 75%     Upper Body Dressing/Undressing Upper body dressing   What is the patient wearing?: Pull over shirt    Upper body assist Assist Level: Supervision/Verbal cueing    Lower Body Dressing/Undressing Lower body dressing      What is the patient wearing?: Pants, Underwear/pull up     Lower body assist Assist for lower body dressing: Minimal Assistance - Patient > 75%     Toileting Toileting    Toileting assist Assist for toileting: Minimal Assistance - Patient > 75%     Transfers Chair/bed transfer  Transfers assist     Chair/bed transfer assist level: Contact Guard/Touching assist  Locomotion Ambulation   Ambulation assist      Assist level: Minimal Assistance - Patient > 75% Assistive device: Walker-hemi Max distance: 80   Walk 10 feet activity   Assist     Assist level: Minimal Assistance - Patient > 75% Assistive device: Walker-hemi   Walk 50 feet activity   Assist Walk 50 feet with 2 turns activity did not occur: Safety/medical concerns (L hemiplegia and pt fatigue)  Assist level: Minimal Assistance - Patient > 75% Assistive device: Walker-hemi    Walk 150 feet activity   Assist Walk 150 feet activity did not occur: Safety/medical concerns (L hemiplegia and pt fatigue)  Assist level: Minimal Assistance - Patient > 75% Assistive device: Walker-Eva    Walk 10 feet on uneven surface  activity   Assist Walk 10 feet on uneven surfaces activity did not occur:  Safety/medical concerns (L hemiplegia and pt fatigue)         Wheelchair     Assist Is the patient using a wheelchair?: Yes Type of Wheelchair: Manual    Wheelchair assist level: Supervision/Verbal cueing Max wheelchair distance: 150    Wheelchair 50 feet with 2 turns activity    Assist        Assist Level: Supervision/Verbal cueing   Wheelchair 150 feet activity     Assist      Assist Level: Supervision/Verbal cueing   Blood pressure (!) 124/57, pulse 75, temperature 98.5 F (36.9 C), temperature source Oral, resp. rate 17, height 5\' 6"  (1.676 m), weight 72.3 kg, SpO2 93%.   Medical Problem List and Plan: 1. 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 Search for facility ongoing. May only be able to go to custodial care facility  D/c SNF-  Con't CIR PT, OT and SLP- still has safety awareness deficits, but hard ot tell if it's her personality or stroke  Will d/c topamax  completely 2. DVT/anticoagulation:  Pharmaceutical: Lovenox              -antiplatelet therapy: Aspirin  81 mg daily and Plavix  75 mg daily x 3 weeks then Plavix  alone 3. Pain Management: Tylenol  as needed  3/25- denies pain- con't regimen rpn   3-30: Ongoing left hip pain; Voltaren  gel and Tylenol .  4/3- having new burning pain in LUE- which is seen sometimes with stroke- will add Duloxetine  30 mg daily for nerve pain- monitor for side effects.  -if gets nausea, will change to Lyrica  4/4-6 no side effects so far-  4/8- will increase Duloxetine  to 60 mg daily  DC hydrocodone  due to side effects, trial tyl #3  as needed for pain that is not relieved by Tylenol . 4/28- advised pt to use voltaren  gel  on all sides of L hand including  between fingers- use tylenol  #3 as needed 4/29- pt admits doesn't like taking/using meds all the time- explained I don't have a "fix"- to make it better- have to take meds to have them work 4/30- encouraged again to use voltaren  4x/day for L hand 5/1- pt said using voltaren  gel and tylenol  #3 as needed- adding lidoderm  patches 2 patches at night for back pain 5/2- HA's- will restart SCHEDULED Topamax  75  mg at bedtime- not sure what occurred? Needs to get nightly 5/9- hasn't asked for pain meds this Am including tylenol , so wants to not register the complaint" yet today.  5/11- Add Lyrica  50 mg BID for nerve pain in L hand 5/13- changed to q6pm since pt feels it affected her vision-made it worse 5/14- said pain better today- not clear if due to prednisone  vs Lyrica ? 5/15 pain much better in L hand-  pt feels it's gout? It might be 5/16- hand pain slightly better with ACE wrap overnight- which is fine 5/20- pt reports having migraines every AM, but refusing topamax  last few days- at least 3- will go back and d/w pt and NOT make changes as we discussed 5/21- will reduce Topamax  to 25 mg at bedtime to help with feelinga little confused 5/22- Will stop Topamax  since made her so "loopy"- pt doesn't want any other meds to try to help HA's- said will take Tylenol  5/26- Still having migraines intermittently 4. Depression/Behavior/Sleep: Provide emotional support  4/21 mood improved--continue duloxetine              -antipsychotic agents: N/A 5. Neuropsych/cognition: This patient is capable of making decisions on her own behalf. 6. Skin/Wound Care: Routine skin checks 7. Fluids/Electrolytes/Nutrition:encouraging po.   5/12- I personally reviewed the patient's labs today.     -encouraging PO 8.  Dysphagia.  Dysphagia 3 with thins currently  4/18 intake ok 5/11-5/13- advised pt to take smaller bites-  intake good, but takes too big of bites    5/14- switched back to full supervision- pt unhappy, but  wasn't listening when I discussed more of how to eat/take bites 5/16- taking smaller bites with full supervision 5/18 eating 65 to 95% of her meals today 5/22- Back to full supervision- has to sit completely upright to eat 5/23- if this goes better, can go back to intermittent supervision once SLP decided 9.  Hyperlipidemia.  Continue Crestor  10.  Recent History of cocaine/tobacco use.  UDS positive for cocaine.  Provide counseling, this has been a chronic issue , seen in PCP notes 11.  Hypothyroidism. continue Synthroid   12.  Constipation.   Senokot-S 2 tab twice daily  5/6- LBM yesterday even though chart says 5/3  5/7- will add Miralax - esp since increased Duloxetine  which can cause constipation- already on Senna 2 tabs BID  5/9- LBM documented as yesterday- medium BM  5.10- LBM 2 days ago- thinks can go today  LBM 5/12  5/15- LBM yesterday  5/17 Increase miralax  to BID, add sorbitol  PRN  5/18 appears she has been refusing miralax , will check abd xray, suspect constipation. sorbitol   5/19- up all night having Bms, but cleaned out  5/20- LBM yesterday  5/22- will d/c Miralax - not taking- and con't Senna  5/23- trying ot have BM on toilet- pt might need intervention if doesn't go by tomorrow  5/26- LBM yesterday 15. Blurry vision and dysconjugate gaze  3/27- will need Ophtho after d/c.   4/4- pt reports everything blurry- cannot see faces as a result- never had eye checked in past- used readers but cannot see at a distance since stroke- will send to Ophtho after d/c.   4/7- d/w pt again- she wants eyeglasses- explained we cannot get when in hospital- also per guidelines, since vision can still change, to not get for 3 months after stroke. She disbelieves it's from stroke, since had 2 prior- that didn't cause vision changes- we educated pt pn how this can occur.  4/25- reminded pt cannot get Optho in hospital- need to get once d/c'd  4/30 went over visual changes with pt- will need to  get glasses, likely prisms 2-3 months after d/c once vision stabilizes  5/6- d/w pt again- she wants to see Optho- explained they do not come into hospital for this  5/18 continued blurry vision, discussed outpatient f/u  5/23- pt having blurry vision again this AM, she says slightly worse- will con't to monitor  16.  Left hip pain.  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 Severe HA-daily HA's  4/15-16 sx appear improved with topamax , tylenol  helps also   -may use sx to get out of therapy at times?  4/23- Per team, pt having HA's frequently which impairs therapy- she won't discuss HA's with me- unclear if only has a day goes on vs self limting behavior  4/24- increase to 75 mg at bedtime  4/25- still having HA's, actually thinks a little worse since not taking Norco for it-   4/28-4/29 fewer complaints about HA this AM  4/30-5/1- - per staff, c/o these less- not interfering with therapy as much  5/2- Wasn't getting topamax - just taking T#3- wil restart Topamax - don't know what occurred with order  5/6- HA's doing better  5/8- not requiring T#3 anymore lately per pt  5/13- Severe HA 5/11 in afternoon- took T#3  5/17 headache today does not sound severe, she has not taken her Tylenol  3 yet but nursing is getting it for her  5/18 headache improved with Tylenol  No. 3 today  5/20- refusing topamax - for past 3+ days- will go back and d/w pt 18. Decreased safety awareness/impulsiveness  4/30- still requiring max cues  5/7- 5/10- no change-per therapy- still requiring max cues  5/23-  Still requiring mod-max cues for safety 20. Spasticity  4/22- off baclofen , and c/o LUE pain- wondering how much of her complaints are due to tone?-   4/30- Spasticity improved in LUE- MAS of 0 actually this AM  5/11-12- Getting some spasticity vs disuse tightness inL hand- fingers esp 3rd/4th DIPs- might need Botox after d/c  5/14- MAS of 1 to 1+ in L wrist and fingers- not proximally  21. 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 .   4/30- pt pulled scab off-  22.  Urinary  and bowel incont due to CVA, cont toileting program 4/23- still cannot control B/B 4/30- on toileting program- is getting somewhat better, but still incontinent more at night  5/13- still incontinent at night  5/18 continued incontinence   23. 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   4/29- will get  Dopplers, because pt says she elevated hand overnight- still swollen- ordered  4/30- Dopplers (-)- will get isotoner compression glove  5/4 received glove  5/6- Swelling ~ 50% better- will increase Duloxetine  to 120 mg daily for nerve pain in L hand and mood.   5/7- hand sore this AM- but not "killing her".   5/8- pain great this AM- doesn't need meds other than tylenol  per pt -sounds like increased cymbalta  has helped  5/9- Some pain in L hand this Am, but hasn't taken tylenol  or T#3 yet this Am- 5/13- Will decrease Lyrica  to 50 mg q6pm since caused her to have visual changes early in day  5/14- Pain doing better in last 12+ hours- not as painful  24. C/O catch in breath- no SOB  5/9- asked pt to use ICS every 1 hour while awake- since  probably is atelectasis   25. Dry eyes Continue  eye drops as needed for pt 26. Gout? Swelling of L hand  5/13- pt has never had gout but runs in her family- she wants to try something else ot see if would help L hand- will try Colchicine  0.6 mg daily x 4 days as well as Prednisone  20 mg daily x 4 days-  5/14- pt feels pain and swelling are better- not sure if due to gout, or just prednisone  vs power of suggestion- will con't to monitor  5/15- pt feels swelling and pain is better- maybe it is gout? Vs overall improvement with prednisone   27. Vaginal candidiasis  5/15- pt reports Sx's- will give Diflucan  150 mg x1   5/22- no more complaints  28. Worsening urinary incontinence/frequency at night  Discussed that UA and UC are negative    LOS: 63 days A FACE TO FACE EVALUATION WAS PERFORMED  Brennden Masten 04/12/2024, 10:04 AM

## 2024-04-12 NOTE — Progress Notes (Signed)
 Physical Therapy Weekly Progress Note  Patient Details  Name: Theresa Watkins MRN: [id number] Date of Birth: 05-Apr-1966  Beginning of progress report period: Apr 05, 2024 End of progress report period: Apr 12, 2024  Today's Date: 04/12/2024 PT Individual Time: 1456-1530 PT Individual Time Calculation (min): 34 min   Patient has met 2 of 2 short term goals.  Ms. Fesler is making slow but steady improvements with gait mechanics and transfers. This week stairs were initiated and pt was able to ambulate 170 ft today with min a. Transfers typically with CGA. Limited by pain and headaches at times.   Patient continues to demonstrate the following deficits muscle weakness, abnormal tone, unbalanced muscle activation, and decreased coordination, and hemiplegia and therefore will continue to benefit from skilled PT intervention to increase functional independence with mobility.  Patient progressing toward long term goals..  Continue plan of care.  PT Short Term Goals Week 8: PT Short Term Goal 1 (Week 8): Pt will initiate stair training PT Short Term Goal 1 - Progress (Week 8): Met PT Short Term Goal 2 (Week 8): Pt will perform floor transfer with CGA or better PT Short Term Goal 2 - Progress (Week 8): Met Week 9: PT Short Term Goal 1 (Week 9): Pt will perform floor transfer with supervision PT Short Term Goal 2 (Week 9): Pt will perform stair navigation with min a or better  Skilled Therapeutic Interventions/Progress Updates:    pt received in bed and agreeable to therapy. Pt reports pain generally in her L side, no interventions required. Session focused on ambulation with hemi walker. ambulatory transfer to w/c with min a. Ambulation trials x 76 ft and x 170 ft with HW and min a overall, intermittent mod for LOB d/t retropulsion. Continued short LLE stance time. Note improvement in flexed knee posture today, though pt states "that's because it hurts." Pt returned to room after session and was  left with all needs in reach and alarm active.   Therapy Documentation Precautions:  Precautions Precautions: Fall Precaution/Restrictions Comments: L hemi Restrictions Weight Bearing Restrictions Per Provider Order: No General:     Therapy/Group: Individual Therapy  Tex Filbert 04/12/2024, 3:39 PM

## 2024-04-12 NOTE — Plan of Care (Signed)
  Problem: Consults Goal: RH STROKE PATIENT EDUCATION Description: See Patient Education module for education specifics  Outcome: Progressing   Problem: RH BOWEL ELIMINATION Goal: RH STG MANAGE BOWEL WITH ASSISTANCE Description: STG Manage Bowel with toileting Assistance. Outcome: Progressing Goal: RH STG MANAGE BOWEL W/MEDICATION W/ASSISTANCE Description: STG Manage Bowel with Medication with mod I Assistance. Outcome: Progressing   Problem: RH SAFETY Goal: RH STG ADHERE TO SAFETY PRECAUTIONS W/ASSISTANCE/DEVICE Description: STG Adhere to Safety Precautions With cues Assistance/Device. Outcome: Progressing   Problem: RH PAIN MANAGEMENT Goal: RH STG PAIN MANAGED AT OR BELOW PT'S PAIN GOAL Description: Pain managed < 4 with prns Outcome: Progressing   Problem: RH KNOWLEDGE DEFICIT Goal: RH STG INCREASE KNOWLEDGE OF HYPERTENSION Description: Patient and mother will be able to manage HTN using educational resources for medications and dietary modification independently Outcome: Progressing Goal: RH STG INCREASE KNOWLEDGE OF DYSPHAGIA/FLUID INTAKE Description: Patient and mother will be able to manage dysphagia using educational resources for medications and dietary modification independently Outcome: Progressing Goal: RH STG INCREASE KNOWLEGDE OF HYPERLIPIDEMIA Description: Patient and mother will be able to manage HLD using educational resources for medications and dietary modification independently Outcome: Progressing Goal: RH STG INCREASE KNOWLEDGE OF STROKE PROPHYLAXIS Description: Patient and mother will be able to manage secondary risks using educational resources for medications and dietary modification independently Outcome: Progressing   Problem: Consults Goal: RH GENERAL PATIENT EDUCATION Description: See Patient Education module for education specifics. Outcome: Progressing Goal: Skin Care Protocol Initiated - if Braden Score 18 or less Description: If consults are  not indicated, leave blank or document N/A Outcome: Progressing Goal: Nutrition Consult-if indicated Outcome: Progressing Goal: Diabetes Guidelines if Diabetic/Glucose > 140 Description: If diabetic or lab glucose is > 140 mg/dl - Initiate Diabetes/Hyperglycemia Guidelines & Document Interventions  Outcome: Progressing   Problem: RH BOWEL ELIMINATION Goal: RH STG MANAGE BOWEL WITH ASSISTANCE Description: STG Manage Bowel with Assistance. Outcome: Progressing Goal: RH STG MANAGE BOWEL W/MEDICATION W/ASSISTANCE Description: STG Manage Bowel with Medication with Assistance. Outcome: Progressing   Problem: RH BLADDER ELIMINATION Goal: RH STG MANAGE BLADDER WITH ASSISTANCE Description: STG Manage Bladder With Assistance Outcome: Progressing Goal: RH STG MANAGE BLADDER WITH MEDICATION WITH ASSISTANCE Description: STG Manage Bladder With Medication With Assistance. Outcome: Progressing Goal: RH STG MANAGE BLADDER WITH EQUIPMENT WITH ASSISTANCE Description: STG Manage Bladder With Equipment With Assistance Outcome: Progressing   Problem: RH SKIN INTEGRITY Goal: RH STG SKIN FREE OF INFECTION/BREAKDOWN Outcome: Progressing Goal: RH STG MAINTAIN SKIN INTEGRITY WITH ASSISTANCE Description: STG Maintain Skin Integrity With Assistance. Outcome: Progressing Goal: RH STG ABLE TO PERFORM INCISION/WOUND CARE W/ASSISTANCE Description: STG Able To Perform Incision/Wound Care With Assistance. Outcome: Progressing   Problem: RH SAFETY Goal: RH STG ADHERE TO SAFETY PRECAUTIONS W/ASSISTANCE/DEVICE Description: STG Adhere to Safety Precautions With Assistance/Device. Outcome: Progressing Goal: RH STG DECREASED RISK OF FALL WITH ASSISTANCE Description: STG Decreased Risk of Fall With Assistance. Outcome: Progressing   Problem: RH PAIN MANAGEMENT Goal: RH STG PAIN MANAGED AT OR BELOW PT'S PAIN GOAL Outcome: Progressing   Problem: RH KNOWLEDGE DEFICIT GENERAL Goal: RH STG INCREASE KNOWLEDGE OF  SELF CARE AFTER HOSPITALIZATION Outcome: Progressing

## 2024-04-12 NOTE — Progress Notes (Signed)
 Speech Language Pathology Daily Session Note  Patient Details  Name: Theresa Watkins MRN: [id number] Date of Birth: [birth year]  Today's Date: 04/12/2024 SLP Group Time: 1200-1300 SLP Group Time Calculation (min): 60 min  Short Term Goals: Week 9: SLP Short Term Goal 1 (Week 9): Patient will communicate complex information with 90% intelligibility and mod iA SLP Short Term Goal 2 (Week 9): Patient will utilize safe swallowing strategies during consumption of regular/thin diet with mod i multimodal A  Group Description: [employer]: Patient participated in [employer] with SLP with focus on dysphagia goals, cognitive goals, and appropriate social interaction within a group environment.    Individual level documentation:  Patient participated in diner's club with focus on dysphagia goals. Patient consumed their lunch meal of dysphasia 3 and thin liquids. Patient consumed meal without overt s/s of aspiration and required supervision assist for use of swallowing compensatory strategies. Recommend patient continue current diet. Patient also demonstrated appropriate social interaction within a group environment with overall mod I assist.      Pain  None endorsed   Therapy/Group: Dysphagia Group  Daemion Mcniel, M.A., CCC-SLP  Shekia Kuper A Lummie Montijo 04/12/2024, 1:51 PM

## 2024-04-12 NOTE — Progress Notes (Signed)
 Occupational Therapy Session Note  Patient Details  Name: Theresa Watkins MRN: [id number] Date of Birth: Oct 23, 1966  Today's Date: 04/12/2024 OT Individual Time: 0700-0810 OT Individual Time Calculation (min): 70 min    Short Term Goals: Week 9: OT Short Term Goal 1 (Week 9): Pt will perform toileting tasks with CGA OT Short Term Goal 2 (Week 9): Pt will use LUE as gross assist with min A during functional tasks  Skilled Therapeutic Interventions/Progress Updates:    Pt resting in bed upon arrival. Pt reported she was "starving" and wanted to eat breakfast. Pt agreeable to getting OOB to eat breakfast. Focus on supine>sit EOB, squat pivot transfer, self feeding, LUE use to assist with opening containers and as gross assist, adhering to swallowing precautions, and attention to task. Supine>sit and squat pivot tranfser with CGA. Min verbal cues for safety. Pt initiated use of LUE to open butter container and attempted to hold knife to assist with cutting food. Pt required min verbal cues to completing swallow food in mouth before taking another bite. No instance of pt attempting to talk with food in mouth. Improved attention to task while eating. Pt returned to bed and reamined in bed. All needs within reach. Bed alarm activated.   Therapy Documentation Precautions:  Precautions Precautions: Fall Precaution/Restrictions Comments: L hemi Restrictions Weight Bearing Restrictions Per Provider Order: No   Pain:  Pt denies pain this morning.   Therapy/Group: Individual Therapy  [name] 04/12/2024, 8:15 AM

## 2024-04-13 ENCOUNTER — Inpatient Hospital Stay (HOSPITAL_COMMUNITY)

## 2024-04-13 DIAGNOSIS — M79642 Pain in left hand: Secondary | ICD-10-CM | POA: Diagnosis not present

## 2024-04-13 DIAGNOSIS — N3946 Mixed incontinence: Secondary | ICD-10-CM | POA: Diagnosis not present

## 2024-04-13 DIAGNOSIS — M778 Other enthesopathies, not elsewhere classified: Secondary | ICD-10-CM | POA: Diagnosis not present

## 2024-04-13 DIAGNOSIS — M25532 Pain in left wrist: Secondary | ICD-10-CM | POA: Diagnosis not present

## 2024-04-13 DIAGNOSIS — R32 Unspecified urinary incontinence: Secondary | ICD-10-CM | POA: Diagnosis not present

## 2024-04-13 DIAGNOSIS — M7989 Other specified soft tissue disorders: Secondary | ICD-10-CM | POA: Diagnosis not present

## 2024-04-13 DIAGNOSIS — I63511 Cerebral infarction due to unspecified occlusion or stenosis of right middle cerebral artery: Secondary | ICD-10-CM | POA: Diagnosis not present

## 2024-04-13 DIAGNOSIS — M1812 Unilateral primary osteoarthritis of first carpometacarpal joint, left hand: Secondary | ICD-10-CM | POA: Diagnosis not present

## 2024-04-13 NOTE — Progress Notes (Signed)
 PROGRESS NOTE   Subjective/Complaints:  Pt had a fall that was prevented yesterday, by pt catching herself on LUE- However her L hand and wrist is hurting a lot more- and has pain radiating up to L shoulder.   It appears per d/w Nursing mgr that's the 2nd fall in last 3 days- one Sunday that pt fell when got OOB on her own and got herself back in bed.   We discussed that we need to get Xray as well as d/w nursing- will get telesitter.    ROS:    Pt denies SOB, abd pain, CP, N/V/C/D, and vision changes  L hand pain/swelling- somewhat worse-  + vision a little burry-continued-worse when having migraines this weekend  Objective:   No results found.      Recent Labs    04/12/24 0543  WBC 5.7  HGB 14.7  HCT 45.0  PLT 172           No results for input(s): "NA", "K", "CL", "CO2", "GLUCOSE", "BUN", "CREATININE", "CALCIUM " in the last 72 hours.          Intake/Output Summary (Last 24 hours) at 04/13/2024 1025 Last data filed at 04/13/2024 0700 Gross per 24 hour  Intake 118 ml  Output --  Net 118 ml            Physical Exam: Vital Signs Blood pressure 127/74, pulse 81, temperature 98 F (36.7 C), resp. rate 18, height 5\' 6"  (1.676 m), weight 72.3 kg, SpO2 92%.          General: awake, alert, appropriate, initially supine, but sat up more ;NAD HENT: conjugate gaze; oropharynx moist CV: regular rate and rhythm; no JVD Pulmonary: CTA B/L; no W/R/R- good air movement GI: soft, NT, ND, (+)BS Psychiatric: appropriate Neurological: Ox3- poor safety awareness, moderate dysarthria MSK- L hand has more ulnar deviation and holding at rest in [place] gesture- tips of fingers are curling- cnanot fully extend MAS of 1- 1+ in L wrist and hand/fingers MSK-L hand Less TTP- less swelling- not wearing glove Neuro: cognitively at baseline. Alert. Sl dysarthria. CN without changes. Able to count  fingers. MAS of 0-1 in LUE Extremities: L hand - less swelling and using to help feed herself Motor strength 0/5 LUE 3-/5 LLE 5/5 on right side  Skin- L PIP of 2nd digit- healing scab, stable 5/25   Assessment/Plan: 1. Functional deficits which require 3+ hours per day of interdisciplinary therapy in a comprehensive inpatient rehab setting. Physiatrist is providing close team supervision and 24 hour management of active medical problems listed below. Physiatrist and rehab team continue to assess barriers to discharge/monitor patient progress toward functional and medical goals  Care Tool:  Bathing    Body parts bathed by patient: Left arm, Chest, Abdomen, Front perineal area, Buttocks, Right upper leg, Left upper leg, Right lower leg, Left lower leg, Face   Body parts bathed by helper: Right arm Body parts n/a: Right arm   Bathing assist Assist Level: Minimal Assistance - Patient > 75%     Upper Body Dressing/Undressing Upper body dressing   What is the patient wearing?: Pull over shirt    Upper body assist  Assist Level: Supervision/Verbal cueing    Lower Body Dressing/Undressing Lower body dressing      What is the patient wearing?: Pants, Underwear/pull up     Lower body assist Assist for lower body dressing: Minimal Assistance - Patient > 75%     Toileting Toileting    Toileting assist Assist for toileting: Minimal Assistance - Patient > 75%     Transfers Chair/bed transfer  Transfers assist     Chair/bed transfer assist level: Contact Guard/Touching assist     Locomotion Ambulation   Ambulation assist      Assist level: Minimal Assistance - Patient > 75% Assistive device: Walker-hemi Max distance: 170   Walk 10 feet activity   Assist     Assist level: Minimal Assistance - Patient > 75% Assistive device: Walker-hemi   Walk 50 feet activity   Assist Walk 50 feet with 2 turns activity did not occur: Safety/medical concerns (L hemiplegia  and pt fatigue)  Assist level: Minimal Assistance - Patient > 75% Assistive device: Walker-hemi    Walk 150 feet activity   Assist Walk 150 feet activity did not occur: Safety/medical concerns (L hemiplegia and pt fatigue)  Assist level: Minimal Assistance - Patient > 75% Assistive device: Walker-hemi    Walk 10 feet on uneven surface  activity   Assist Walk 10 feet on uneven surfaces activity did not occur: Safety/medical concerns (L hemiplegia and pt fatigue)         Wheelchair     Assist Is the patient using a wheelchair?: Yes Type of Wheelchair: Manual    Wheelchair assist level: Supervision/Verbal cueing Max wheelchair distance: 150    Wheelchair 50 feet with 2 turns activity    Assist        Assist Level: Supervision/Verbal cueing   Wheelchair 150 feet activity     Assist      Assist Level: Supervision/Verbal cueing   Blood pressure 127/74, pulse 81, temperature 98 F (36.7 C), resp. rate 18, height 5\' 6"  (1.676 m), weight 72.3 kg, SpO2 92%.   Medical Problem List and Plan: 1. 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 Search for facility ongoing. May only be able to go to custodial care facility  D/c SNF-  Con't CIR PT, OT and SLP  2 falls in last 48-72 hours- will order Telesitter  Xrays of L hand/wrist- pending 2. DVT/anticoagulation:  Pharmaceutical: Lovenox              -antiplatelet therapy: Aspirin  81 mg daily and Plavix  75 mg daily x 3 weeks then Plavix  alone 3. Pain Management: Tylenol  as needed  3/25- denies pain- con't regimen rpn   3-30: Ongoing left hip pain; Voltaren  gel and Tylenol .  4/3- having new burning pain in LUE- which is seen sometimes with stroke-  will add Duloxetine  30 mg daily for nerve pain- monitor for side effects.  -if gets nausea, will change to Lyrica  4/4-6 no side effects so far-  4/8- will increase Duloxetine  to 60 mg daily  DC hydrocodone  due to side effects, trial tyl #3  as needed for pain that is not relieved by Tylenol . 4/28- advised pt to use voltaren  gel  on all sides of L hand including between fingers- use tylenol  #3 as needed 4/29- pt admits doesn't like taking/using meds all the time- explained I don't have a "fix"- to make it better- have to take meds to have them work 4/30- encouraged again to use voltaren  4x/day for L hand 5/1- pt said using voltaren  gel and tylenol  #3 as needed- adding lidoderm  patches 2 patches at night for back pain 5/2- HA's- will restart SCHEDULED Topamax  75 mg at bedtime- not sure what occurred? Needs to get nightly 5/9- hasn't asked for pain meds this Am including tylenol , so wants to not register the complaint" yet today.  5/11- Add Lyrica  50 mg BID for nerve pain in L hand 5/13- changed to q6pm since pt feels it affected her vision-made it worse 5/14- said pain better today- not clear if due to prednisone  vs Lyrica ? 5/15 pain much better in L hand-  pt feels it's gout? It might be 5/16- hand pain slightly better with ACE wrap overnight- which is fine 5/20- pt reports having migraines every AM, but refusing topamax  last few days- at least 3- will go back and d/w pt and NOT make changes as we discussed 5/21- will reduce Topamax  to 25 mg at bedtime to help with feelinga little confused 5/22- Will stop Topamax  since made her so "loopy"- pt doesn't want any other meds to try to help HA's- said will take Tylenol  5/26- Still having migraines intermittently 5/27- L hand and wrist pain- radiates to L shoulder- no deformity- but holds hand in vulcan gesture at rest- xrays pending 4. Depression/Behavior/Sleep: Provide emotional support  4/21 mood improved--continue duloxetine               -antipsychotic agents: N/A 5. Neuropsych/cognition: This patient is capable of making decisions on her own behalf. 6. Skin/Wound Care: Routine skin checks 7. Fluids/Electrolytes/Nutrition:encouraging po.   5/12- I personally reviewed the patient's labs today.     -encouraging PO 8.  Dysphagia.  Dysphagia 3 with thins currently  4/18 intake ok 5/11-5/13- advised pt to take smaller bites-  intake good, but takes too big of bites    5/14- switched back to full supervision- pt unhappy, but wasn't listening when I discussed more of how to eat/take bites 5/16- taking smaller bites with full supervision 5/18 eating 65 to 95% of her meals today 5/22- Back to full supervision- has to sit completely upright to eat 5/23- if this goes better, can go back to intermittent supervision once SLP decided 9.  Hyperlipidemia.  Continue Crestor  10.  Recent History of cocaine/tobacco use.  UDS positive for cocaine.  Provide counseling, this has been a chronic issue , seen in PCP notes 11.  Hypothyroidism. continue Synthroid   12.  Constipation.   Senokot-S 2 tab twice daily  5/6- LBM yesterday even though chart says 5/3  5/7- will add Miralax - esp since increased Duloxetine  which can cause constipation- already on Senna 2 tabs BID  5/9- LBM documented as yesterday- medium BM  5.10- LBM 2 days ago- thinks can go today  LBM 5/12  5/15- LBM yesterday  5/17 Increase miralax  to BID, add sorbitol  PRN  5/18 appears she has been refusing miralax , will check abd xray, suspect constipation. sorbitol   5/19- up all night having Bms, but cleaned out  5/20- LBM yesterday  5/22- will d/c Miralax - not taking- and con't Senna  5/23- trying ot have BM on toilet- pt might need intervention if doesn't go by tomorrow  5/26- LBM yesterday 15.  Blurry vision and dysconjugate gaze  3/27- will need Ophtho after d/c.   4/4- pt reports everything blurry- cannot see faces as a result- never had eye checked in past- used readers but  cannot see at a distance since stroke- will send to Ophtho after d/c.   4/7- d/w pt again- she wants eyeglasses- explained we cannot get when in hospital- also per guidelines, since vision can still change, to not get for 3 months after stroke. She disbelieves it's from stroke, since had 2 prior- that didn't cause vision changes- we educated pt pn how this can occur.   4/25- reminded pt cannot get Optho in hospital- need to get once d/c'd  4/30 went over visual changes with pt- will need to get glasses, likely prisms 2-3 months after d/c once vision stabilizes  5/6- d/w pt again- she wants to see Optho- explained they do not come into hospital for this  5/18 continued blurry vision, discussed outpatient f/u  5/23- pt having blurry vision again this AM, she says slightly worse- will con't to monitor  16.  Left hip pain.  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 Severe HA-daily HA's  4/15-16 sx appear improved with topamax , tylenol  helps also   -may use sx to get out of therapy at times?  4/23- Per team, pt having HA's frequently which impairs therapy- she won't discuss HA's with me- unclear if only has a day goes on vs self limting behavior  4/24- increase to 75 mg at bedtime  4/25- still having HA's, actually thinks a little worse since not taking Norco for it-   4/28-4/29 fewer complaints about HA this AM  4/30-5/1- - per staff, c/o these less- not interfering with therapy as much  5/2- Wasn't getting topamax - just taking T#3- wil restart Topamax - don't know what occurred with  order  5/6- HA's doing better  5/8- not requiring T#3 anymore lately per pt  5/13- Severe HA 5/11 in afternoon- took T#3  5/17 headache today does not sound severe, she has not taken her Tylenol  3 yet but nursing is getting it for her  5/18 headache improved with Tylenol  No. 3 today  5/20- refusing topamax - for past 3+ days- will go back and d/w pt 18. Decreased safety awareness/impulsiveness  4/30- still requiring max cues  5/7- 5/10- no change-per therapy- still requiring max cues  5/23- Still requiring mod-max cues for safety 20. Spasticity  4/22- off baclofen , and c/o LUE pain- wondering how much of her complaints are due to tone?-   4/30- Spasticity improved in LUE- MAS of 0 actually this AM  5/11-12- Getting some spasticity vs disuse tightness inL hand- fingers esp 3rd/4th DIPs- might need Botox after d/c  5/14- MAS of 1 to 1+ in L wrist and fingers- not proximally  5/17- Spasticity slightly worse this AM- esp when yawns- explained that's normal with spasticity due to increased pain 21. 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 .   4/30- pt pulled scab off-  22.  Urinary  and bowel incont due to CVA, cont toileting program 4/23- still cannot control B/B 4/30- on toileting program- is getting somewhat better, but still incontinent more at night  5/13- still incontinent at night  5/18 continued incontinence   23. 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   4/29- will get Dopplers, because pt says she elevated hand overnight- still swollen- ordered  4/30- Dopplers (-)- will get isotoner compression  glove  5/4 received glove  5/6- Swelling ~ 50% better- will increase Duloxetine  to 120 mg daily for nerve pain in L hand and mood.   5/7- hand sore this AM- but not "killing her".   5/8- pain great this AM- doesn't need meds other than tylenol  per pt -sounds like increased cymbalta  has helped  5/9- Some pain in L hand this Am, but hasn't taken tylenol  or T#3 yet this Am- 5/13- Will decrease Lyrica  to 50 mg q6pm since caused her to have visual changes early in day  5/14- Pain doing better in last 12+ hours- not as painful  24. C/O catch in breath- no SOB  5/9- asked pt to use ICS every 1 hour while awake- since probably is atelectasis   25. Dry eyes Continue  eye drops as needed for pt 26. Gout? Swelling of L hand  5/13- pt has never had gout but runs in her family- she wants to try something else ot see if would help L hand- will try Colchicine  0.6 mg daily x 4 days as well as Prednisone  20 mg daily x 4 days-  5/14- pt feels pain and swelling are better- not sure if due to gout, or just prednisone  vs power of suggestion- will con't to monitor  5/15- pt feels swelling and pain is better- maybe it is gout? Vs overall improvement with prednisone   27. Vaginal candidiasis  5/15- pt reports Sx's- will give Diflucan  150 mg x1   5/22- no more complaints  28. Worsening urinary incontinence/frequency at night  Discussed that UA and UC are negative  I spent a total of 52   minutes on total care today- >50% coordination of care- due to  Team conference to f/u on progress; and xray- which is pending- and d/w nursing mgr and nursing about telesitter trying to get her one  LOS: 64 days A FACE TO FACE EVALUATION WAS PERFORMED  Anayelli Lai 04/13/2024, 10:25 AM

## 2024-04-13 NOTE — Plan of Care (Signed)
  Problem: Consults Goal: RH STROKE PATIENT EDUCATION Description: See Patient Education module for education specifics  Outcome: Progressing   Problem: RH BOWEL ELIMINATION Goal: RH STG MANAGE BOWEL WITH ASSISTANCE Description: STG Manage Bowel with toileting Assistance. Outcome: Progressing Goal: RH STG MANAGE BOWEL W/MEDICATION W/ASSISTANCE Description: STG Manage Bowel with Medication with mod I Assistance. Outcome: Progressing   Problem: RH SAFETY Goal: RH STG ADHERE TO SAFETY PRECAUTIONS W/ASSISTANCE/DEVICE Description: STG Adhere to Safety Precautions With cues Assistance/Device. Outcome: Progressing   Problem: RH PAIN MANAGEMENT Goal: RH STG PAIN MANAGED AT OR BELOW PT'S PAIN GOAL Description: Pain managed < 4 with prns Outcome: Progressing   Problem: RH KNOWLEDGE DEFICIT Goal: RH STG INCREASE KNOWLEDGE OF HYPERTENSION Description: Patient and mother will be able to manage HTN using educational resources for medications and dietary modification independently Outcome: Progressing Goal: RH STG INCREASE KNOWLEDGE OF DYSPHAGIA/FLUID INTAKE Description: Patient and mother will be able to manage dysphagia using educational resources for medications and dietary modification independently Outcome: Progressing Goal: RH STG INCREASE KNOWLEGDE OF HYPERLIPIDEMIA Description: Patient and mother will be able to manage HLD using educational resources for medications and dietary modification independently Outcome: Progressing Goal: RH STG INCREASE KNOWLEDGE OF STROKE PROPHYLAXIS Description: Patient and mother will be able to manage secondary risks using educational resources for medications and dietary modification independently Outcome: Progressing   Problem: Consults Goal: RH GENERAL PATIENT EDUCATION Description: See Patient Education module for education specifics. Outcome: Progressing Goal: Skin Care Protocol Initiated - if Braden Score 18 or less Description: If consults are  not indicated, leave blank or document N/A Outcome: Progressing Goal: Nutrition Consult-if indicated Outcome: Progressing Goal: Diabetes Guidelines if Diabetic/Glucose > 140 Description: If diabetic or lab glucose is > 140 mg/dl - Initiate Diabetes/Hyperglycemia Guidelines & Document Interventions  Outcome: Progressing   Problem: RH BOWEL ELIMINATION Goal: RH STG MANAGE BOWEL WITH ASSISTANCE Description: STG Manage Bowel with Assistance. Outcome: Progressing Goal: RH STG MANAGE BOWEL W/MEDICATION W/ASSISTANCE Description: STG Manage Bowel with Medication with Assistance. Outcome: Progressing   Problem: RH BLADDER ELIMINATION Goal: RH STG MANAGE BLADDER WITH ASSISTANCE Description: STG Manage Bladder With Assistance Outcome: Progressing Goal: RH STG MANAGE BLADDER WITH MEDICATION WITH ASSISTANCE Description: STG Manage Bladder With Medication With Assistance. Outcome: Progressing Goal: RH STG MANAGE BLADDER WITH EQUIPMENT WITH ASSISTANCE Description: STG Manage Bladder With Equipment With Assistance Outcome: Progressing   Problem: RH SKIN INTEGRITY Goal: RH STG SKIN FREE OF INFECTION/BREAKDOWN Outcome: Progressing Goal: RH STG MAINTAIN SKIN INTEGRITY WITH ASSISTANCE Description: STG Maintain Skin Integrity With Assistance. Outcome: Progressing Goal: RH STG ABLE TO PERFORM INCISION/WOUND CARE W/ASSISTANCE Description: STG Able To Perform Incision/Wound Care With Assistance. Outcome: Progressing   Problem: RH SAFETY Goal: RH STG ADHERE TO SAFETY PRECAUTIONS W/ASSISTANCE/DEVICE Description: STG Adhere to Safety Precautions With Assistance/Device. Outcome: Progressing Goal: RH STG DECREASED RISK OF FALL WITH ASSISTANCE Description: STG Decreased Risk of Fall With Assistance. Outcome: Progressing   Problem: RH PAIN MANAGEMENT Goal: RH STG PAIN MANAGED AT OR BELOW PT'S PAIN GOAL Outcome: Progressing   Problem: RH KNOWLEDGE DEFICIT GENERAL Goal: RH STG INCREASE KNOWLEDGE OF  SELF CARE AFTER HOSPITALIZATION Outcome: Progressing

## 2024-04-13 NOTE — Progress Notes (Signed)
 Patient ID: Theresa Watkins, female   DOB: 06-22-1966, 58 y.o.   MRN: [id number]   SW met with pt in room to provide updates on continuing to work on alternative options for discharge.   SW waiting on updates from [place] [facility]- placement SW Chastiny if any new updates.   Norval Been, MSW, LCSW Office: [phone] Cell: [phone] Fax: [phone]

## 2024-04-14 DIAGNOSIS — I63511 Cerebral infarction due to unspecified occlusion or stenosis of right middle cerebral artery: Secondary | ICD-10-CM | POA: Diagnosis not present

## 2024-04-14 NOTE — Plan of Care (Signed)
 Pt alert and oriented x 4. At bedrest. Tolerated po meds. No complaints voiced. Assistance provided  as needed.encouraged to call for assistance as needed. Sr x3 elevated.bed in low position.

## 2024-04-14 NOTE — Progress Notes (Signed)
 When nurse enter the room during rounding. Patient was talking to the telesitter and appear to be upset/irritated about safety plan that was put in place. Nurse educated patient about adhering to safety plan. Patient expressed not needing telesitter in the room/ anyone bossing her around. Nurse try to explain again about utilized the call light and following safety precautions.

## 2024-04-14 NOTE — Plan of Care (Signed)
 Pt alert and oriented x4. Skin warm and dry. Pt's mother and son visited this pm. Pt out of the bed and to the bsc with one person assist.urinating without difficulty. Medicated for c/o back pain, with relief noted. Pt declined the melatonin,stating the pain med would help with rest tolerated her evening meal. Encouraged to call for assistance as needed. Call light in reach. Sr x3 elevated. Bed alarm activated.

## 2024-04-14 NOTE — Progress Notes (Signed)
 Physical Therapy Session Note  Patient Details  Name: Theresa Watkins MRN: [id number] Date of Birth: 1966/10/15  Today's Date: 04/14/2024 PT Individual Time: 1033-1102 PT Individual Time Calculation (min): 29 min   Short Term Goals: Week 9: PT Short Term Goal 1 (Week 9): Pt will perform floor transfer with supervision PT Short Term Goal 2 (Week 9): Pt will perform stair navigation with min a or better  Skilled Therapeutic Interventions/Progress Updates: Patient supine in bed on entrance to room. Patient alert and agreeable to PT session.   Patient reported "splitting HA" at end of session - nsg notified.   Therapeutic Activity: Bed Mobility: Pt performed supine<sit on EOB with minA (CGA sit<supine) (HOB slightly elevated). Transfers: Pt performed sit<>stand transfers throughout session with HW and with minA and cues for pivot sequence (L knee block required at end due to pt presentation of buckling).   - Pt required min/modA to donn personal brief/pants with cues to perform bridge/side-roll  Neuromuscular Re-ed: NMR facilitated during session with focus on coordination, dynamic standing balance. - Step to 4" step B LE's with minA. Pt required increased time/effort to step with R LE vs L LE, and with decreased step clearance to fully step onto step (pt reported fear of L LE buckling). Pt with HHA throughout and seated rest breaks required.  NMR performed for improvements in motor control and coordination, balance, sequencing, judgement, and self confidence/ efficacy in performing all aspects of mobility at highest level of independence.   Patient supine in bed at end of session with brakes locked, bed alarm set, and all needs within reach.      Therapy Documentation Precautions:  Precautions Precautions: Fall Precaution/Restrictions Comments: L hemi Restrictions Weight Bearing Restrictions Per Provider Order: No   Therapy/Group: Individual Therapy  Erling Arrazola  PTA 04/14/2024, 12:32 PM

## 2024-04-14 NOTE — Progress Notes (Signed)
 Speech Language Pathology Daily Session Note  Patient Details  Name: Theresa Watkins MRN: [id number] Date of Birth: 20-Mar-1966  Today's Date: 04/14/2024 SLP Individual Time: 4098-1191 SLP Individual Time Calculation (min): 44 min  Short Term Goals: Week 8: SLP Short Term Goal 1 (Week 8): Patient will communicate complex information with 90% intelligibility and mod iA SLP Short Term Goal 1 - Progress (Week 8): Met SLP Short Term Goal 2 (Week 8): Patient will utilize safe swallowing strategies during consumption of regular/thin diet with mod i multimodal A SLP Short Term Goal 2 - Progress (Week 8): Met  Skilled Therapeutic Interventions:  Patient was seen in am to address speech intelligibility. Pt was alert and seen at bedside. She was agreeable for session. Pt indep repositioned upright in bed. She completed 5 sets of 5 at 35cm H2O on EMST for breath control. SLP challenged pt in verbalization of tongue twisters with pt warranting min A for over articulation and pausing to improve speech quality. Pt was subsequently engaged in spontaneous conversational exchange where she maintained 90% intelligibility with instances of fluctuations while laughing or with increased emotion. At conclusion of session, she was left in bed with call button within reach, telesitter active, and call button within reach. SLP to continue POC.   Pain Pain Assessment Pain Scale: 0-10 Pain Score: 5  Pain Type: Acute pain Pain Location: Head Pain Frequency: Constant Pain Onset: On-going Patients Stated Pain Goal: 3 Pain Intervention(s): Medication (See eMAR)  Therapy/Group: Individual Therapy  Adela Holter 04/14/2024, 11:24 AM

## 2024-04-14 NOTE — Progress Notes (Signed)
 Occupational Therapy Session Note  Patient Details  Name: AMIAYA MCNEELEY MRN: [id number] Date of Birth: February 17, 1966  Today's Date: 04/14/2024 OT Individual Time: 1135-1202 OT Individual Time Calculation (min): 27 min    Short Term Goals: Week 9: OT Short Term Goal 1 (Week 9): Pt will perform toileting tasks with CGA OT Short Term Goal 2 (Week 9): Pt will use LUE as gross assist with min A during functional tasks  Skilled Therapeutic Interventions/Progress Updates:    Patient received supine in bed.  Patient asking to shower, and have staff change her bedding.  Patient needs much more than 30 min to shower, so encouraged considering a task that could be completed in 30 min.  Patient indicated need to have a BM.  Assisted patient to edge of bed with increased time, allowed her to don her own socks, and then transferred her to wheelchair.  Transported patient to bathroom.  Patient had continent urine void and able to perform hygiene.  Patient needs assist to pull pants up/down on left hip.  Worked on wheelchair navigation in room as she made her way to the sink to wash her hand.  Patient opted to stay up in wheelchair until she could be bathed.  Patient left up in wheelchair, she adamantly declined the safety belt.  Personal items in reach.    Therapy Documentation Precautions:  Precautions Precautions: Fall Precaution/Restrictions Comments: L hemi Restrictions Weight Bearing Restrictions Per Provider Order: No  Pain: Initially patient indicated no pain - laughing and joking with staff.  Later when asked specifically indicated a Headache 8/10 Has had medicine earlier    Therapy/Group: Individual Therapy  Wilburt Messina M 04/14/2024, 12:09 PM

## 2024-04-14 NOTE — Progress Notes (Signed)
 PROGRESS NOTE   Subjective/Complaints:  Pt  reports doesn't want/like the telesitter! Feels doesn't deserve it.   Wore brace on RUE last night- the WHO and LUE pain much better Admits "fall" last weekend, not this past weekend-  fell onto bed, not onto floor, which was different from what I was originally told.   Said with SLP, SLP "fell on her". She said she didn't fall.   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 Borderline widening of the scapholunate articulation but normal scapholunate angle. The wrist is flexed on the lateral projection. No appreciable fracture or acute bony finding. There is potentially some mild soft tissue swelling dorsal to the radiocarpal joint. IMPRESSION: 1. 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 2. Borderline widening of the scapholunate articulation but normal scapholunate angle. 3. Potential mild soft tissue swelling dorsal to the radiocarpal joint. Electronically Signed   By: Freida Jes M.D.   On: 04/13/2024 12:42   DG Hand Complete Left Result Date: 04/13/2024 CLINICAL DATA:  Left hand pain EXAM: LEFT HAND - COMPLETE 3+ VIEW COMPARISON:  None Available. FINDINGS: The fingers are flexed during imaging, resulting in suboptimal orientation and bony overlap which lowers diagnostic sensitivity and specificity. The technologist notes these were the best images obtainable. Presumably the patient is unable to straighten the fingers.  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 Spurring along distal interphalangeal joints favors osteoarthritis. No visible fracture or bony destructive findings. IMPRESSION: 1. 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 2. The fingers are flexed during imaging, resulting in suboptimal orientation and bony overlap which lowers diagnostic sensitivity and specificity. Presumably the patient is unable to straighten the fingers. Electronically Signed   By: Freida Jes M.D.   On: 04/13/2024 12:40        Recent Labs    04/12/24 0543  WBC 5.7  HGB 14.7  HCT 45.0  PLT 172           No results for input(s): "NA", "K", "CL", "CO2", "GLUCOSE", "BUN", "CREATININE", "CALCIUM " in the last 72 hours.          Intake/Output Summary (Last 24 hours) at 04/14/2024 0951 Last data filed at 04/13/2024 1200 Gross per 24 hour  Intake 236 ml  Output --  Net 236 ml            Physical Exam: Vital Signs Blood pressure 113/84, pulse 77, temperature 97.9 F (36.6 C), resp. rate 17, height 5\' 6"  (1.676 m), weight 72.3 kg, SpO2 95%.          General: awake, alert, appropriate, telesitter in room; NAD HENT: conjugate gaze; oropharynx dry CV: regular rate and rhythm; no JVD Pulmonary: CTA B/L; no W/R/R- good air movement GI: soft, NT, ND, (+)BS  Psychiatric: appropriate Neurological: Ox3- poor safety awareness, moderate dysarthria MSK- L hand less swelling- tips of fingers wont' fully extend- tiny bit of flexion at tips MAS of 1- 1+ in L wrist and hand/fingers MSK-L hand Less TTP- less swelling- not wearing glove today Neuro: cognitively at baseline. Alert. Sl dysarthria. CN without changes. Able to count fingers. MAS of 0-1 in LUE Extremities: L hand - less swelling and using to help feed herself Motor strength 0/5 LUE 3-/5 LLE 5/5 on right side  Skin- L PIP of 2nd digit- healing scab, stable  5/25   Assessment/Plan: 1. Functional deficits which require 3+ hours per day of interdisciplinary therapy in a comprehensive inpatient rehab setting. Physiatrist is providing close team supervision and 24 hour management of active medical problems listed below. Physiatrist and rehab team continue to assess barriers to discharge/monitor patient progress toward functional and medical goals  Care Tool:  Bathing    Body parts bathed by patient: Left arm, Chest, Abdomen, Front perineal area, Buttocks, Right upper leg, Left upper leg, Right lower leg, Left lower leg, Face   Body parts bathed by helper: Right arm Body parts n/a: Right arm   Bathing assist Assist Level: Minimal Assistance - Patient > 75%     Upper Body Dressing/Undressing Upper body dressing   What is the patient wearing?: Pull over shirt    Upper body assist Assist Level: Supervision/Verbal cueing    Lower Body Dressing/Undressing Lower body dressing      What is the patient wearing?: Pants, Underwear/pull up     Lower body assist Assist for lower body dressing: Minimal Assistance - Patient > 75%     Toileting Toileting    Toileting assist Assist for toileting: Minimal Assistance - Patient > 75%     Transfers Chair/bed transfer  Transfers assist     Chair/bed transfer assist level: Contact Guard/Touching assist     Locomotion Ambulation   Ambulation assist      Assist level: Minimal Assistance - Patient > 75% Assistive device: Walker-hemi Max distance: 170   Walk 10 feet activity   Assist     Assist level: Minimal Assistance - Patient > 75% Assistive device: Walker-hemi   Walk 50 feet activity   Assist Walk 50 feet with 2 turns activity did not occur: Safety/medical concerns (L hemiplegia and pt fatigue)  Assist level: Minimal Assistance - Patient > 75% Assistive device: Walker-hemi    Walk 150 feet activity   Assist Walk 150 feet activity did not occur: Safety/medical  concerns (L hemiplegia and pt fatigue)  Assist level: Minimal Assistance - Patient > 75% Assistive device: Walker-hemi    Walk 10 feet on uneven surface  activity   Assist Walk 10 feet on uneven surfaces activity did not occur: Safety/medical concerns (L hemiplegia and pt fatigue)         Wheelchair     Assist Is the patient using a wheelchair?: Yes Type of Wheelchair: Manual    Wheelchair assist level: Supervision/Verbal cueing Max wheelchair distance: 150    Wheelchair 50 feet with 2 turns activity    Assist        Assist Level: Supervision/Verbal cueing   Wheelchair 150 feet activity     Assist      Assist Level: Supervision/Verbal cueing   Blood pressure 113/84, pulse 77, temperature 97.9 F (36.6 C), resp. rate 17, height 5\' 6"  (1.676 m), weight 72.3 kg, SpO2 95%.   Medical Problem List and Plan: 1. 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 Search for facility ongoing. May only be able to go to custodial care facility  D/c SNF-  Con't  CIR PT, OT and SLP   Pt doesn't want telesitter- let nursing know-  2. DVT/anticoagulation:  Pharmaceutical: Lovenox              -antiplatelet therapy: Aspirin  81 mg daily and Plavix  75 mg daily x 3 weeks then Plavix  alone 3. Pain Management: Tylenol  as needed  3/25- denies pain- con't regimen rpn   3-30: Ongoing left hip pain; Voltaren  gel and Tylenol .  4/3- having new burning pain in LUE- which is seen sometimes with stroke- will add Duloxetine  30 mg daily for nerve pain- monitor for side effects.  -if gets nausea, will change to Lyrica  4/4-6 no side effects so far-  4/8- will increase Duloxetine  to 60 mg daily  DC hydrocodone  due to side  effects, trial tyl #3  as needed for pain that is not relieved by Tylenol . 4/28- advised pt to use voltaren  gel  on all sides of L hand including between fingers- use tylenol  #3 as needed 4/29- pt admits doesn't like taking/using meds all the time- explained I don't have a "fix"- to make it better- have to take meds to have them work 4/30- encouraged again to use voltaren  4x/day for L hand 5/1- pt said using voltaren  gel and tylenol  #3 as needed- adding lidoderm  patches 2 patches at night for back pain 5/2- HA's- will restart SCHEDULED Topamax  75 mg at bedtime- not sure what occurred? Needs to get nightly 5/9- hasn't asked for pain meds this Am including tylenol , so wants to not register the complaint" yet today.  5/11- Add Lyrica  50 mg BID for nerve pain in L hand 5/13- changed to q6pm since pt feels it affected her vision-made it worse 5/14- said pain better today- not clear if due to prednisone  vs Lyrica ? 5/15 pain much better in L hand-  pt feels it's gout? It might be 5/16- hand pain slightly better with ACE wrap overnight- which is fine 5/20- pt reports having migraines every AM, but refusing topamax  last few days- at least 3- will go back and d/w pt and NOT make changes as we discussed 5/21- will reduce Topamax  to 25 mg at bedtime to help with feelinga little confused 5/22- Will stop Topamax  since made her so "loopy"- pt doesn't want any other meds to try to help HA's- said will take Tylenol  5/26- Still having migraines intermittently 5/27- L hand and wrist pain- radiates to L shoulder- no deformity- but holds hand in vulcan gesture at rest- xrays pending 5/28- Hand pain better- xrays (-) 4. Depression/Behavior/Sleep: Provide emotional support  4/21 mood improved--continue duloxetine              -antipsychotic agents: N/A 5. Neuropsych/cognition: This patient is capable of making decisions on her own behalf. 6. Skin/Wound Care: Routine skin checks 7.  Fluids/Electrolytes/Nutrition:encouraging po.   5/12- I personally reviewed the patient's labs today.     -encouraging PO 8.  Dysphagia.  Dysphagia 3 with thins currently  4/18 intake ok 5/11-5/13- advised pt to take smaller bites-  intake good, but takes  too big of bites    5/14- switched back to full supervision- pt unhappy, but wasn't listening when I discussed more of how to eat/take bites 5/16- taking smaller bites with full supervision 5/18 eating 65 to 95% of her meals today 5/22- Back to full supervision- has to sit completely upright to eat 5/23- if this goes better, can go back to intermittent supervision once SLP decided 9.  Hyperlipidemia.  Continue Crestor  10.  Recent History of cocaine/tobacco use.  UDS positive for cocaine.  Provide counseling, this has been a chronic issue , seen in PCP notes 11.  Hypothyroidism. continue Synthroid   12.  Constipation.   Senokot-S 2 tab twice daily  5/6- LBM yesterday even though chart says 5/3  5/7- will add Miralax - esp since increased Duloxetine  which can cause constipation- already on Senna 2 tabs BID  5/9- LBM documented as yesterday- medium BM  5.10- LBM 2 days ago- thinks can go today  LBM 5/12  5/15- LBM yesterday  5/17 Increase miralax  to BID, add sorbitol  PRN  5/18 appears she has been refusing miralax , will check abd xray, suspect constipation. sorbitol   5/19- up all night having Bms, but cleaned out  5/20- LBM yesterday  5/22- will d/c Miralax - not taking- and con't Senna  5/23- trying ot have BM on toilet- pt might need intervention if doesn't go by tomorrow  5/26- LBM yesterday 15. Blurry vision and dysconjugate gaze  3/27- will need Ophtho after d/c.   4/4- pt reports everything blurry- cannot see faces as a result- never had eye checked in past- used readers but cannot see at a distance since stroke- will send to Ophtho after d/c.   4/7- d/w pt again- she wants eyeglasses- explained we cannot get when in hospital- also  per guidelines, since vision can still change, to not get for 3 months after stroke. She disbelieves it's from stroke, since had 2 prior- that didn't cause vision changes- we educated pt pn how this can occur.   4/25- reminded pt cannot get Optho in hospital- need to get once d/c'd  4/30 went over visual changes with pt- will need to get glasses, likely prisms 2-3 months after d/c once vision stabilizes  5/6- d/w pt again- she wants to see Optho- explained they do not come into hospital for this  5/18 continued blurry vision, discussed outpatient f/u  5/23- pt having blurry vision again this AM, she says slightly worse- will con't to monitor  16.  Left hip pain.  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 Severe HA-daily HA's  4/15-16 sx appear improved with topamax , tylenol  helps also   -may use sx to get out of therapy at times?  4/23- Per team, pt having HA's frequently which impairs therapy- she won't discuss HA's with me- unclear if only has a day goes on vs self limting behavior  4/24- increase to 75 mg at bedtime  4/25- still having HA's, actually thinks a little worse since not taking Norco for it-   4/28-4/29 fewer complaints about HA this  AM  4/30-5/1- - per staff, c/o these less- not interfering with therapy as much  5/2- Wasn't getting topamax - just taking T#3- wil restart Topamax - don't know what occurred with order  5/6- HA's doing better  5/8- not requiring T#3 anymore lately per pt  5/13- Severe HA 5/11 in afternoon- took T#3  5/17 headache today does not sound severe,  she has not taken her Tylenol  3 yet but nursing is getting it for her  5/18 headache improved with Tylenol  No. 3 today  5/20- refusing topamax - for past 3+ days- will go back and d/w pt 18. Decreased safety awareness/impulsiveness  4/30- still requiring max cues  5/7- 5/10- no change-per therapy- still requiring max cues  5/23- Still requiring mod-max cues for safety 20. Spasticity  4/22- off baclofen , and c/o LUE pain- wondering how much of her complaints are due to tone?-   4/30- Spasticity improved in LUE- MAS of 0 actually this AM  5/11-12- Getting some spasticity vs disuse tightness inL hand- fingers esp 3rd/4th DIPs- might need Botox after d/c  5/14- MAS of 1 to 1+ in L wrist and fingers- not proximally  5/17- Spasticity slightly worse this AM- esp when yawns- explained that's normal with spasticity due to increased pain 21. 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 .   4/30- pt pulled scab off-  22.  Urinary  and bowel incont due to CVA, cont toileting program 4/23- still cannot control B/B 4/30- on toileting program- is getting somewhat better, but still incontinent more at night  5/13- still incontinent at night  5/18 continued incontinence   23. 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   4/29- will get Dopplers, because pt says she elevated hand overnight- still swollen- ordered  4/30- Dopplers (-)- will get isotoner compression glove  5/4 received glove  5/6- Swelling ~ 50% better- will increase Duloxetine  to 120 mg daily for nerve pain in L hand and mood.   5/7- hand sore this AM- but not  "killing her".   5/8- pain great this AM- doesn't need meds other than tylenol  per pt -sounds like increased cymbalta  has helped  5/9- Some pain in L hand this Am, but hasn't taken tylenol  or T#3 yet this Am- 5/13- Will decrease Lyrica  to 50 mg q6pm since caused her to have visual changes early in day  5/14- Pain doing better in last 12+ hours- not as painful  24. C/O catch in breath- no SOB  5/9- asked pt to use ICS every 1 hour while awake- since probably is atelectasis   25. Dry eyes Continue  eye drops as needed for pt 26. Gout? Swelling of L hand  5/13- pt has never had gout but runs in her family- she wants to try something else ot see if would help L hand- will try Colchicine  0.6 mg daily x 4 days as well as Prednisone  20 mg daily x 4 days-  5/14- pt feels pain and swelling are better- not sure if due to gout, or just prednisone  vs power of suggestion- will con't to monitor  5/15- pt feels swelling and pain is better- maybe it is gout? Vs overall improvement with prednisone   27. Vaginal candidiasis  5/15- pt reports Sx's- will give Diflucan  150 mg x1   5/22- no more complaints  28. Worsening urinary incontinence/frequency at night  Discussed that UA and UC are negative   I spent a total of  36  minutes on total care today- >50% coordination of care- due to  Reached out to nursing mgr-  about telesitter- also d/w pt about telesitter as well - and reviewed Xrays independently  LOS: 65 days A FACE TO FACE EVALUATION WAS PERFORMED  Karington Zarazua 04/14/2024, 9:51 AM

## 2024-04-14 NOTE — Patient Care Conference (Signed)
 Inpatient RehabilitationTeam Conference and Plan of Care Update Date: 04/13/2024   Time: 1110 am    Patient Name: Theresa Watkins      Medical Record Number: [id number]  Date of Birth: 1966/03/17 Sex: Female         Room/Bed: 4W01C/4W01C-01 Payor Info: Payor: [place] MEDICAID PREPAID HEALTH PLAN / Plan: [place] MEDICAID HEALTHY BLUE / Product Type: *No Product type* /    Admit Date/Time:  02/09/2024  6:29 PM  Primary Diagnosis:  Right middle cerebral artery stroke [facility])  Hospital Problems: Principal Problem:   Right middle cerebral artery stroke (HCC) Active Problems:   Polysubstance abuse (HCC)    Expected Discharge Date: Expected Discharge Date:  (pending)  Team Members Present: Physician leading conference: Dr. Celia Coles Social Worker Present: Norval Been, LCSW Nurse Present: Jerene Monks, RN PT Present: Aundria Leech, PT OT Present: [name], OT;Jackqueline Mason, COTA SLP Present: Jenney Modest, SLP Other (Discipline and Name): Cody Das NP PPS Coordinator present : Jestine Moron, SLP     Current Status/Progress Goal Weekly Team Focus  Bowel/Bladder   continent of b/b; LBM: 5/25   gain regular bowel pattern   assist with toileting needs prn    Swallow/Nutrition/ Hydration   regular/ thin- supervision for compensatory strategies   modI  carryover of compensatory strategies to decrease supervision    ADL's   bathing-CGA; LB dressing-mod A for safety; UB dressing-CGA, transfers-CGA/min A; impulsive; initiating LUE use in functional tasks   min A overall; LB dressing downgraded to mod A; dynamic standing balance downgraded to min A; anticipatory awareness downgraded to mod A   Safety awareness, tranfsers, BADLs, LUE NMR    Mobility   Supervision bed mobility, near supervision some transfers, otherwise min-CGA for safety. limited by safety awareness and impulsivity, gait with hemi walker 170 ft   supervision bed<>chair, min a short distance gait, mod i w/c  gait,  transfer, retropulsion    Communication   mixed dysarthria with fluctuating speech intelligibilty   mod I   carryover of compensatory strategies    Safety/Cognition/ Behavioral Observations               Pain   c/o pain to LUE; PRN tylenol  3   <4/10 pain level   assess pain QS and prn    Skin   skin intact   maintain skin integrity  assess skin QS and prn      Discharge Planning:  Current barriers remains SNF with bed offer and insurance approval pending. [place]DSS Placement SW is working on assisting with finding SNF as well. SW will confirm there are no barriers to discharge.    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  Patient limited by left hand pain, headaches,impulsivity, decreases safety awareness,  mixed dysarthria and self limiting behaviors.    Patient on target to meet rehab goals: yes, currently patient requires CGA with upper body care and mod assist with lower body care. Patient requires supervision on some transfers and able to ambulate 170' min-CGA using hemi walker. Overall goals at discharge are set for min assistance.   *See Care Plan and progress notes for long and short-term goals.   Revisions to Treatment Plan:  Hemi dressing techniques  Blanca Bunch Hemi walker IOPI EMST device Full supervision with meals  Teaching Needs: Safety, medications, transfers, toileting , etc    Current Barriers to Discharge: Decreased caregiver support, Home enviroment access/layout, and  Incontinence  Possible Resolutions to Barriers: Family Education SNF     Medical Summary Current Status: telesitter- inconitnent of B/B at night- L writ/hand pain- xray pending  Barriers to Discharge: Behavior/Mood;Incontinence;Neurogenic Bowel & Bladder;Uncontrolled Pain;Spasticity;Self-care education  Barriers to Discharge Comments: made Qday- since been here so long-slef limited-  poor safey awareness- L hemi and inattention of L; Possible Resolutions to [employer] Focus: Xray of L hand and wrist- making some progress- 170 ft- wait for spasticity meds since so confused on baclofen - d/c SNF   Continued Need for Acute Rehabilitation Level of Care: The patient requires daily medical management by a physician with specialized training in physical medicine and rehabilitation for the following reasons: Direction of a multidisciplinary physical rehabilitation program to maximize functional independence : Yes Medical management of patient stability for increased activity during participation in an intensive rehabilitation regime.: Yes Analysis of laboratory values and/or radiology reports with any subsequent need for medication adjustment and/or medical intervention. : Yes   I attest that I was present, lead the team conference, and concur with the assessment and plan of the team.   Theresa Watkins 04/13/2024, 1110 am

## 2024-04-14 NOTE — Progress Notes (Signed)
 Recreational Therapy Session Note  Patient Details  Name: ASHLEI CHINCHILLA MRN: [id number] Date of Birth: 11-Apr-1966 Today's Date: 04/14/2024  Pain:no c/o  Pt participated in animal assisted activity seated in bed with supervision.  Pt excited to see pet partner team again and easily engaged with team.  Pt appreciative of this visit.  Rhen Kawecki 04/14/2024, 4:25 PM

## 2024-04-15 DIAGNOSIS — I63511 Cerebral infarction due to unspecified occlusion or stenosis of right middle cerebral artery: Secondary | ICD-10-CM | POA: Diagnosis not present

## 2024-04-15 MED ORDER — DICLOFENAC SODIUM 1 % EX GEL
2.0000 g | Freq: Four times a day (QID) | CUTANEOUS | Status: DC | PRN
  Administered 2024-04-22: 2 g via TOPICAL
  Filled 2024-04-15: qty 100

## 2024-04-15 NOTE — Progress Notes (Signed)
 Pt rested this shift. No change noted in her status. Encouraged to call for assistance as needed. Call light in reach. Bed in low position.

## 2024-04-15 NOTE — Progress Notes (Signed)
 PROGRESS NOTE   Subjective/Complaints:  Pt reports much happier that Telesitter was d/c'd after my discussion with nursing mgr.    Today "is a good day" and OT just left.  LBM yesterday and feels like can go today.   Less swelling of LUE; wore brace all day/night yesterday.  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 Borderline widening of the scapholunate articulation but normal scapholunate angle. The wrist is flexed on the lateral projection. No appreciable fracture or acute bony finding. There is potentially some mild soft tissue swelling dorsal to the radiocarpal joint. IMPRESSION: 1. 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 2. Borderline widening of the scapholunate articulation but normal scapholunate angle. 3. Potential mild soft tissue swelling dorsal to the radiocarpal joint. Electronically Signed   By: Freida Jes M.D.   On: 04/13/2024 12:42   DG Hand Complete Left Result Date: 04/13/2024 CLINICAL DATA:  Left hand pain EXAM: LEFT HAND - COMPLETE 3+ VIEW COMPARISON:  None Available. FINDINGS: The fingers are flexed during imaging, resulting in suboptimal orientation and bony overlap which lowers diagnostic sensitivity and specificity. The technologist notes these were the best images obtainable. Presumably the patient is unable to straighten the fingers. 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  Spurring along distal interphalangeal joints favors osteoarthritis. No visible fracture or bony destructive findings. IMPRESSION: 1. 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 2. The fingers are flexed during imaging, resulting in suboptimal orientation and bony overlap which lowers diagnostic sensitivity and specificity. Presumably the patient is unable to straighten the fingers. Electronically Signed   By: Freida Jes M.D.   On: 04/13/2024 12:40        No results for input(s): "WBC", "HGB", "HCT", "PLT" in the last 72 hours.          No results for input(s): "NA", "K", "CL", "CO2", "GLUCOSE", "BUN", "CREATININE", "CALCIUM " in the last 72 hours.          Intake/Output Summary (Last 24 hours) at 04/15/2024 0849 Last data filed at 04/15/2024 0525 Gross per 24 hour  Intake --  Output 0 ml  Net 0 ml            Physical Exam: Vital Signs Blood pressure 116/74, pulse 93, temperature 98.7 F (37.1 C), temperature source Oral, resp. rate 16, height 5\' 6"  (1.676 m), weight 72.3 kg, SpO2 95%.           General: awake, alert, appropriate,  down at end of bed- needed nurse to get her up in bed; NAD HENT: conjugate gaze; oropharynx dry CV: regular rate and rhythm; no JVD Pulmonary: CTA B/L; no W/R/R- good air movement GI: soft, NT, ND, (+)  BS Psychiatric: appropriate- brighter affect Neurological: Ox3 Moderate dysarthria-   MSK- L hand less swelling- looks even better today- in WHO; - tips of fingers wont' fully extend- tiny bit of flexion at tips MAS of 1- 1+ in L wrist and hand/fingers MSK-L hand Less TTP- less swelling- not wearing glove today Neuro: cognitively at baseline. Alert. Sl dysarthria. CN without changes. Able to count fingers. MAS of 0-1 in LUE Extremities: L hand - less swelling and using to help feed herself Motor strength 0/5 LUE 3-/5 LLE 5/5 on right side  Skin- L PIP of 2nd  digit- healing scab, stable 5/25   Assessment/Plan: 1. Functional deficits which require 3+ hours per day of interdisciplinary therapy in a comprehensive inpatient rehab setting. Physiatrist is providing close team supervision and 24 hour management of active medical problems listed below. Physiatrist and rehab team continue to assess barriers to discharge/monitor patient progress toward functional and medical goals  Care Tool:  Bathing    Body parts bathed by patient: Left arm, Chest, Abdomen, Front perineal area, Buttocks, Right upper leg, Left upper leg, Right lower leg, Left lower leg, Face   Body parts bathed by helper: Right arm Body parts n/a: Right arm   Bathing assist Assist Level: Minimal Assistance - Patient > 75%     Upper Body Dressing/Undressing Upper body dressing   What is the patient wearing?: Pull over shirt    Upper body assist Assist Level: Supervision/Verbal cueing    Lower Body Dressing/Undressing Lower body dressing      What is the patient wearing?: Pants, Underwear/pull up     Lower body assist Assist for lower body dressing: Minimal Assistance - Patient > 75%     Toileting Toileting    Toileting assist Assist for toileting: Minimal Assistance - Patient > 75%     Transfers Chair/bed transfer  Transfers assist     Chair/bed transfer assist level: Contact Guard/Touching assist     Locomotion Ambulation   Ambulation assist      Assist level: Minimal Assistance - Patient > 75% Assistive device: Walker-hemi Max distance: 170   Walk 10 feet activity   Assist     Assist level: Minimal Assistance - Patient > 75% Assistive device: Walker-hemi   Walk 50 feet activity   Assist Walk 50 feet with 2 turns activity did not occur: Safety/medical concerns (L hemiplegia and pt fatigue)  Assist level: Minimal Assistance - Patient > 75% Assistive device: Walker-hemi    Walk 150 feet activity   Assist Walk 150 feet activity did  not occur: Safety/medical concerns (L hemiplegia and pt fatigue)  Assist level: Minimal Assistance - Patient > 75% Assistive device: Walker-hemi    Walk 10 feet on uneven surface  activity   Assist Walk 10 feet on uneven surfaces activity did not occur: Safety/medical concerns (L hemiplegia and pt fatigue)         Wheelchair     Assist Is the patient using a wheelchair?: Yes Type of Wheelchair: Manual    Wheelchair assist level: Supervision/Verbal cueing Max wheelchair distance: 150    Wheelchair 50 feet with 2 turns activity    Assist        Assist Level: Supervision/Verbal cueing   Wheelchair 150 feet activity     Assist      Assist Level: Supervision/Verbal cueing   Blood pressure 116/74, pulse 93, temperature 98.7 F (37.1 C), temperature source Oral, resp. rate 16, height 5\' 6"  (1.676 m), weight 72.3 kg, SpO2  95%.   Medical Problem List and Plan: 1. 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 Search for facility ongoing. May only be able to go to custodial care facility  D/c SNF-  Con't CIR PT, OT and SLP D/c'd telesitter because pt was very upset about it- educated her that if she falls again, she will need to have telesitter.   2. DVT/anticoagulation:  Pharmaceutical: Lovenox              -antiplatelet therapy: Aspirin  81 mg daily and Plavix  75 mg daily x 3 weeks then Plavix  alone 3. Pain Management: Tylenol  as needed  3/25- denies pain- con't regimen rpn   3-30: Ongoing left hip pain; Voltaren  gel and Tylenol .  4/3- having new burning pain in LUE- which is seen sometimes with stroke- will add Duloxetine  30 mg daily for nerve pain- monitor for side effects.  -if gets nausea, will  change to Lyrica  4/4-6 no side effects so far-  4/8- will increase Duloxetine  to 60 mg daily  DC hydrocodone  due to side effects, trial tyl #3  as needed for pain that is not relieved by Tylenol . 4/28- advised pt to use voltaren  gel  on all sides of L hand including between fingers- use tylenol  #3 as needed 4/29- pt admits doesn't like taking/using meds all the time- explained I don't have a "fix"- to make it better- have to take meds to have them work 4/30- encouraged again to use voltaren  4x/day for L hand 5/1- pt said using voltaren  gel and tylenol  #3 as needed- adding lidoderm  patches 2 patches at night for back pain 5/2- HA's- will restart SCHEDULED Topamax  75 mg at bedtime- not sure what occurred? Needs to get nightly 5/9- hasn't asked for pain meds this Am including tylenol , so wants to not register the complaint" yet today.  5/11- Add Lyrica  50 mg BID for nerve pain in L hand 5/13- changed to q6pm since pt feels it affected her vision-made it worse 5/14- said pain better today- not clear if due to prednisone  vs Lyrica ? 5/15 pain much better in L hand-  pt feels it's gout? It might be 5/16- hand pain slightly better with ACE wrap overnight- which is fine 5/20- pt reports having migraines every AM, but refusing topamax  last few days- at least 3- will go back and d/w pt and NOT make changes as we discussed 5/21- will reduce Topamax  to 25 mg at bedtime to help with feelinga little confused 5/22- Will stop Topamax  since made her so "loopy"- pt doesn't want any other meds to try to help HA's- said will take Tylenol  5/26- Still having migraines intermittently 5/27- L hand and wrist pain- radiates to L shoulder- no deformity- but holds hand in vulcan gesture at rest- xrays pending 5/29- hand swelling and pain almost back to baseline 4. Depression/Behavior/Sleep: Provide emotional support  4/21 mood improved--continue duloxetine              -antipsychotic agents: N/A 5. Neuropsych/cognition:  This patient is capable of making decisions on her own behalf. 6. Skin/Wound Care: Routine skin checks 7. Fluids/Electrolytes/Nutrition:encouraging po.   5/12- I personally reviewed the patient's labs today.     -  encouraging PO 8.  Dysphagia.  Dysphagia 3 with thins currently  4/18 intake ok 5/11-5/13- advised pt to take smaller bites-  intake good, but takes too big of bites    5/14- switched back to full supervision- pt unhappy, but wasn't listening when I discussed more of how to eat/take bites 5/16- taking smaller bites with full supervision 5/18 eating 65 to 95% of her meals today 5/22- Back to full supervision- has to sit completely upright to eat 5/23- if this goes better, can go back to intermittent supervision once SLP decided 9.  Hyperlipidemia.  Continue Crestor  10.  Recent History of cocaine/tobacco use.  UDS positive for cocaine.  Provide counseling, this has been a chronic issue , seen in PCP notes 11.  Hypothyroidism. continue Synthroid   12.  Constipation.   Senokot-S 2 tab twice daily  5/6- LBM yesterday even though chart says 5/3  5/7- will add Miralax - esp since increased Duloxetine  which can cause constipation- already on Senna 2 tabs BID  5/9- LBM documented as yesterday- medium BM  5.10- LBM 2 days ago- thinks can go today  LBM 5/12  5/15- LBM yesterday  5/17 Increase miralax  to BID, add sorbitol  PRN  5/18 appears she has been refusing miralax , will check abd xray, suspect constipation. sorbitol   5/19- up all night having Bms, but cleaned out  5/20- LBM yesterday  5/22- will d/c Miralax - not taking- and con't Senna  5/23- trying ot have BM on toilet- pt might need intervention if doesn't go by tomorrow  5/26- LBM yesterday  5/29- LBM yesterday 15. Blurry vision and dysconjugate gaze  3/27- will need Ophtho after d/c.   4/4- pt reports everything blurry- cannot see faces as a result- never had eye checked in past- used readers but cannot see at a distance since  stroke- will send to Ophtho after d/c.   4/7- d/w pt again- she wants eyeglasses- explained we cannot get when in hospital- also per guidelines, since vision can still change, to not get for 3 months after stroke. She disbelieves it's from stroke, since had 2 prior- that didn't cause vision changes- we educated pt pn how this can occur.   4/25- reminded pt cannot get Optho in hospital- need to get once d/c'd  4/30 went over visual changes with pt- will need to get glasses, likely prisms 2-3 months after d/c once vision stabilizes  5/6- d/w pt again- she wants to see Optho- explained they do not come into hospital for this  5/18 continued blurry vision, discussed outpatient f/u  5/23- pt having blurry vision again this AM, she says slightly worse- will con't to monitor  16.  Left hip pain.  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 Severe HA-daily HA's  4/15-16 sx appear improved with topamax , tylenol  helps also   -may use sx to get out of therapy at times?  4/23- Per team, pt having HA's frequently which impairs therapy- she won't discuss HA's with me- unclear if only has a day goes on vs self limting behavior  4/24- increase to 75 mg at bedtime  4/25- still having HA's, actually thinks a little worse since not taking Norco for it-   4/28-4/29 fewer complaints about HA this AM  4/30-5/1- - per staff, c/o these less- not interfering with therapy as much  5/2- Wasn't getting topamax - just taking T#3- wil restart Topamax - don't know what occurred with order  5/6- HA's doing better  5/8-  not requiring T#3 anymore lately per pt  5/13- Severe HA 5/11 in afternoon- took T#3  5/17 headache today does not sound severe, she has not taken her Tylenol  3 yet but nursing is getting it for her  5/18 headache improved with Tylenol  No. 3 today  5/20- refusing topamax - for past 3+ days- will go back and d/w pt 18. Decreased safety awareness/impulsiveness  4/30- still requiring max cues  5/7- 5/10- no change-per therapy- still requiring max cues  5/23- Still requiring mod-max cues for safety 20. Spasticity  4/22- off baclofen , and c/o LUE pain- wondering how much of her complaints are due to tone?-   4/30- Spasticity improved in LUE- MAS of 0 actually this AM  5/11-12- Getting some spasticity vs disuse tightness inL hand- fingers esp 3rd/4th DIPs- might need Botox after d/c  5/14- MAS of 1 to 1+ in L wrist and fingers- not proximally  5/27- Spasticity slightly worse this AM- esp when yawns- explained that's normal with spasticity due to increased pain 21. 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 .   4/30- pt pulled scab off-  22.  Urinary  and bowel incont due to CVA, cont toileting program 4/23- still cannot control B/B 4/30- on toileting program- is getting somewhat better, but still incontinent more at night  5/13- still incontinent at night  5/18 continued incontinence   23. 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   4/29- will get Dopplers, because pt says she elevated hand overnight- still swollen- ordered  4/30- Dopplers (-)- will get isotoner compression glove  5/4 received glove  5/6- Swelling  ~ 50% better- will increase Duloxetine  to 120 mg daily for nerve pain in L hand and mood.   5/7- hand sore this AM- but not "killing her".   5/8- pain great this AM- doesn't need meds other than tylenol  per pt -sounds like increased cymbalta  has helped  5/9- Some pain in L hand this Am, but hasn't taken tylenol  or T#3 yet this Am- 5/13- Will decrease Lyrica  to 50 mg q6pm since caused her to have visual changes early in day  5/14- Pain doing better in last 12+ hours- not as painful  24. C/O catch in breath- no SOB  5/9- asked pt to use ICS every 1 hour while awake- since probably is atelectasis   25. Dry eyes Continue  eye drops as needed for pt 26. Gout? Swelling of L hand  5/13- pt has never had gout but runs in her family- she wants to try something else ot see if would help L hand- will try Colchicine  0.6 mg daily x 4 days as well as Prednisone  20  mg daily x 4 days-  5/14- pt feels pain and swelling are better- not sure if due to gout, or just prednisone  vs power of suggestion- will con't to monitor  5/15- pt feels swelling and pain is better- maybe it is gout? Vs overall improvement with prednisone   27. Vaginal candidiasis  5/15- pt reports Sx's- will give Diflucan  150 mg x1   5/22- no more complaints  28. Worsening urinary incontinence/frequency at night  Discussed that UA and UC are negative  I spent a total of  35  minutes on total care today- >50% coordination of care- due to  D/w nursing mgr about telesitter- and d/w pt about it as well- decided to stop it.   LOS: 66 days A FACE TO FACE EVALUATION WAS PERFORMED  Theresa Watkins 04/15/2024, 8:49 AM

## 2024-04-15 NOTE — Progress Notes (Signed)
 Responded to telesitter call and bed alarm. Patient angry, states that "your safety rules are stupid." When I reminded her of fall, she stated, "I did not fall, I sat on the ground." Attempted to explain safety procedures but patient asked me to leave room.

## 2024-04-15 NOTE — Plan of Care (Signed)
  Problem: Consults Goal: RH STROKE PATIENT EDUCATION Description: See Patient Education module for education specifics  Outcome: Progressing   Problem: RH BOWEL ELIMINATION Goal: RH STG MANAGE BOWEL WITH ASSISTANCE Description: STG Manage Bowel with toileting Assistance. Outcome: Progressing

## 2024-04-15 NOTE — Plan of Care (Signed)
  Problem: RH BOWEL ELIMINATION Goal: RH STG MANAGE BOWEL WITH ASSISTANCE Description: STG Manage Bowel with toileting Assistance. Outcome: Progressing   Problem: RH SAFETY Goal: RH STG ADHERE TO SAFETY PRECAUTIONS W/ASSISTANCE/DEVICE Description: STG Adhere to Safety Precautions With cues Assistance/Device. Outcome: Progressing   Problem: RH PAIN MANAGEMENT Goal: RH STG PAIN MANAGED AT OR BELOW PT'S PAIN GOAL Description: Pain managed < 4 with prns Outcome: Progressing   Problem: RH KNOWLEDGE DEFICIT Goal: RH STG INCREASE KNOWLEDGE OF HYPERTENSION Description: Patient and mother will be able to manage HTN using educational resources for medications and dietary modification independently Outcome: Progressing   Problem: Consults Goal: RH GENERAL PATIENT EDUCATION Description: See Patient Education module for education specifics. Outcome: Progressing   Problem: RH BOWEL ELIMINATION Goal: RH STG MANAGE BOWEL WITH ASSISTANCE Description: STG Manage Bowel with Assistance. Outcome: Progressing   Problem: RH BLADDER ELIMINATION Goal: RH STG MANAGE BLADDER WITH ASSISTANCE Description: STG Manage Bladder With Assistance Outcome: Progressing   Problem: RH PAIN MANAGEMENT Goal: RH STG PAIN MANAGED AT OR BELOW PT'S PAIN GOAL Outcome: Progressing

## 2024-04-15 NOTE — Progress Notes (Signed)
 Speech Language Pathology Weekly Progress and Session Note  Patient Details  Name: Theresa Watkins MRN: [id number] Date of Birth: [birth year]  Beginning of progress report period: Apr 09, 2024 End of progress report period: Apr 15, 2024  Today's Date: 04/15/2024 SLP Individual Time: 1034 - 1058  24 min  Short Term Goals: Week 8: SLP Short Term Goal 1 (Week 8): Patient will communicate complex information with 90% intelligibility and mod iA SLP Short Term Goal 1 - Progress (Week 8): Progressing toward goal SLP Short Term Goal 2 (Week 8): Patient will utilize safe swallowing strategies during consumption of regular/thin diet with mod i multimodal A SLP Short Term Goal 2 - Progress (Week 8): Progressing toward goal  New Short Term Goals: Week 8: SLP Short Term Goal 1 (Week 8): (Week 10) Patient will communicate complex information with 90% intelligibility and mod iA SLP Short Term Goal 1 - Progress (Week 8): Met SLP Short Term Goal 2 (Week 8): (Week 10) Patient will utilize safe swallowing strategies during consumption of regular/thin diet with mod i multimodal A SLP Short Term Goal 2 - Progress (Week 8): Met  Weekly Progress Updates: Patient continues to make slow progress towards therapy goals and progresses towards modI use of speech intelligibility and swallowing strategies. At this time, patient continues to benefit from supervision to achieve 90% intelligibility during conveyance of complex information as well as regular/thin liquid diet item consumption. Patient and family education ongoing. Patient will continue to benefit from skilled therapy services during remainder of CIR stay.     Intensity: Minumum of 1-2 x/day, 30 to 90 minutes Frequency: 1 to 3 out of 7 days Duration/Length of Stay: TBD Treatment/Interventions: Multimodal communication approach;Speech/Language facilitation;Functional tasks;Therapeutic Activities;Therapeutic Exercise;Internal/external  aids;Dysphagia/aspiration precaution training;Patient/family education  Daily Session Skilled Therapeutic Interventions: SLP conducted skilled therapy session targeting communication goals. SLP facilitated children's book reading task to target use of SLOP speech intelligibility strategies at the complex information level. Patient benefited from supervision cues to utilize strategies consistently. She benefited most from reminders to utilize 'loud' and 'pause' strategies. Need for cuing increased to min assist due to fatigue by end of session. Patient was left in room with call bell in reach and alarm set. SLP will continue to target goals per plan of care.       Pain Pain Assessment Pain Scale: 0-10 Pain Score: 0-No pain  Therapy/Group: Individual Therapy  Theresa Watkins, M.A., CCC-SLP  Theresa Watkins A Theresa Watkins 04/15/2024, 10:48 AM

## 2024-04-15 NOTE — Progress Notes (Addendum)
 Physical Therapy Session Note  Patient Details  Name: Theresa Watkins MRN: [id number] Date of Birth: 08-29-1966  Today's Date: 04/15/2024 PT Individual Time: 1130-1200 PT Individual Time Calculation (min): 30 min   Short Term Goals: Week 8: PT Short Term Goal 1 (Week 8): Pt will initiate stair training PT Short Term Goal 1 - Progress (Week 8): Met PT Short Term Goal 2 (Week 8): Pt will perform floor transfer with CGA or better PT Short Term Goal 2 - Progress (Week 8): Met  Skilled Therapeutic Interventions/Progress Updates:    pt received in bed and agreeable to therapy. No complaint of pain. ambulatory transfer to bathroom with min a and hemi walker. Continent bowel void. CGA for clothing management and supervision hygiene in seated. Pt then washed hands in standing with CGA. Donned pants with assist to thread LLE. ambulatory transfer across room to w/c with RW, pt demoes increased safety and required only very light min a and cues for safety. States she prefers this device.  Pt remained seated up in w/c with needs in reach to await supervision for meal, refused alarm belt.   Therapy Documentation Precautions:  Precautions Precautions: Fall Precaution/Restrictions Comments: L hemi Restrictions Weight Bearing Restrictions Per Provider Order: No General:       Therapy/Group: Individual Therapy  Tex Filbert 04/15/2024, 12:19 PM

## 2024-04-15 NOTE — Progress Notes (Signed)
 Occupational Therapy Session Note  Patient Details  Name: Theresa Watkins MRN: [id number] Date of Birth: [birth year]  Today's Date: 04/15/2024 OT Individual Time: 0700-0745 OT Individual Time Calculation (min): 45 min    Short Term Goals: Week 9: OT Short Term Goal 1 (Week 9): Pt will perform toileting tasks with CGA OT Short Term Goal 2 (Week 9): Pt will use LUE as gross assist with min A during functional tasks  Skilled Therapeutic Interventions/Progress Updates:    Pt resting in bed upon arrival. OT intervention with focus on functional transfers, standing balance, toileting, self feeding, sittinf balance, and safety awareness to increase independence with bADLs. Pt requested to use BSC. Stand pivot transfer with CGA. Pt amb 5' with hemi walker to sink and stood at sink to wash hands before returning to EOB. CGA for all amb. Pt using LUE to assist with washing hands, opening containers, and cutting pancakes. Pt demonstrating inreased use of LUE in functional tasks. No verbal cues for swallowing strategies this session. Pt remained EOB. NT notied. Bed alarm activated.   Therapy Documentation Precautions:  Precautions Precautions: Fall Precaution/Restrictions Comments: L hemi Restrictions Weight Bearing Restrictions Per Provider Order: No Pain:  Pt denies pain this morning   Therapy/Group: Individual Therapy  [name] 04/15/2024, 7:47 AM

## 2024-04-16 DIAGNOSIS — I63511 Cerebral infarction due to unspecified occlusion or stenosis of right middle cerebral artery: Secondary | ICD-10-CM | POA: Diagnosis not present

## 2024-04-16 NOTE — Progress Notes (Signed)
 PROGRESS NOTE   Subjective/Complaints:  Pt upset about telesitter- she said the bed/chair alarm that they somehow left off, but according to actual therapist, she REFUSED chair alarm and then was found on floor in bed' getting shoe out from chair"- she said she waited 30 minutes til NT came to her room, however we are checking on this.   I explained we can do telesitter for 1 week and if she does better with safety, then we can d/c it.   LBM yesterday L hand not hurting thi sAM- feels good when in L WHO ROS:    Pt denies SOB, abd pain, CP, N/V/C/D, and vision changes  L hand pain/swelling- somewhat worse-  + vision a little burry-continued-worse when having migraines this weekend  Objective:   No results found.       No results for input(s): "WBC", "HGB", "HCT", "PLT" in the last 72 hours.          No results for input(s): "NA", "K", "CL", "CO2", "GLUCOSE", "BUN", "CREATININE", "CALCIUM " in the last 72 hours.          Intake/Output Summary (Last 24 hours) at 04/16/2024 0828 Last data filed at 04/15/2024 2032 Gross per 24 hour  Intake 320 ml  Output --  Net 320 ml            Physical Exam: Vital Signs Blood pressure 110/75, pulse 80, temperature 98.1 F (36.7 C), temperature source Oral, resp. rate 18, height 5\' 6"  (1.676 m), weight 72.3 kg, SpO2 92%.            General: awake, alert, appropriate, supine in bed;  NAD HENT: conjugate gaze; oropharynx moist CV: regular rate and rhythm; no JVD Pulmonary: CTA B/L; no W/R/R- good air movement GI: soft, NT, ND, (+)BS Psychiatric:frustrated with telesitter and us  Neurological: Ox3- poor safety awareness Moderate dysarthria  MSK- L hand less swelling- looks even better today- in WHO; - tips of fingers wont' fully extend- tiny bit of flexion at tips MAS of 1- 1+ in L wrist and hand/fingers MSK-L hand Less TTP- less swelling- not  wearing glove today Neuro: cognitively at baseline. Alert. Sl dysarthria. CN without changes. Able to count fingers. MAS of 0-1 in LUE Extremities: L hand - less swelling and using to help feed herself Motor strength 0/5 LUE 3-/5 LLE 5/5 on right side  Skin- L PIP of 2nd digit- healing scab, stable 5/25   Assessment/Plan: 1. Functional deficits which require 3+ hours per day of interdisciplinary therapy in a comprehensive inpatient rehab setting. Physiatrist is providing close team supervision and 24 hour management of active medical problems listed below. Physiatrist and rehab team continue to assess barriers to discharge/monitor patient progress toward functional and medical goals  Care Tool:  Bathing    Body parts bathed by patient: Left arm, Chest, Abdomen, Front perineal area, Buttocks, Right upper leg, Left upper leg, Right lower leg, Left lower leg, Face   Body parts bathed by helper: Right arm Body parts n/a: Right arm   Bathing assist Assist Level: Minimal Assistance - Patient > 75%     Upper Body Dressing/Undressing Upper body dressing   What is the patient  wearing?: Pull over shirt    Upper body assist Assist Level: Supervision/Verbal cueing    Lower Body Dressing/Undressing Lower body dressing      What is the patient wearing?: Pants, Underwear/pull up     Lower body assist Assist for lower body dressing: Minimal Assistance - Patient > 75%     Toileting Toileting    Toileting assist Assist for toileting: Minimal Assistance - Patient > 75%     Transfers Chair/bed transfer  Transfers assist     Chair/bed transfer assist level: Contact Guard/Touching assist     Locomotion Ambulation   Ambulation assist      Assist level: Minimal Assistance - Patient > 75% Assistive device: Walker-rolling Max distance: 170   Walk 10 feet activity   Assist     Assist level: Minimal Assistance - Patient > 75% Assistive device: Walker-hemi   Walk 50  feet activity   Assist Walk 50 feet with 2 turns activity did not occur: Safety/medical concerns (L hemiplegia and pt fatigue)  Assist level: Minimal Assistance - Patient > 75% Assistive device: Walker-hemi    Walk 150 feet activity   Assist Walk 150 feet activity did not occur: Safety/medical concerns (L hemiplegia and pt fatigue)  Assist level: Minimal Assistance - Patient > 75% Assistive device: Walker-hemi    Walk 10 feet on uneven surface  activity   Assist Walk 10 feet on uneven surfaces activity did not occur: Safety/medical concerns (L hemiplegia and pt fatigue)         Wheelchair     Assist Is the patient using a wheelchair?: Yes Type of Wheelchair: Manual    Wheelchair assist level: Supervision/Verbal cueing Max wheelchair distance: 150    Wheelchair 50 feet with 2 turns activity    Assist        Assist Level: Supervision/Verbal cueing   Wheelchair 150 feet activity     Assist      Assist Level: Supervision/Verbal cueing   Blood pressure 110/75, pulse 80, temperature 98.1 F (36.7 C), temperature source Oral, resp. rate 18, height 5\' 6"  (1.676 m), weight 72.3 kg, SpO2 92%.   Medical Problem List and Plan: 1. 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 Search for facility ongoing. May only be able to go to custodial care facility  D/c SNF-  Con't CIR- PT, OT and SLP 1x/day Pt wants to get rif otelesitter placed yesterday- cannot do that right now- found on floor yesterday - she reports didn't fall, but was "looking for shoe" Will con't telesitter for 1 week and re-eval then   2. DVT/anticoagulation:  Pharmaceutical: Lovenox              -antiplatelet therapy: Aspirin  81  mg daily and Plavix  75 mg daily x 3 weeks then Plavix  alone 3. Pain Management: Tylenol  as needed  3/25- denies pain- con't regimen rpn   3-30: Ongoing left hip pain; Voltaren  gel and Tylenol .  4/3- having new burning pain in LUE- which is seen sometimes with stroke- will add Duloxetine  30 mg daily for nerve pain- monitor for side effects.  -if gets nausea, will change to Lyrica  4/4-6 no side effects so far-  4/8- will increase Duloxetine  to 60 mg daily  DC hydrocodone  due to side effects, trial tyl #3  as needed for pain that is not relieved by Tylenol . 4/28- advised pt to use voltaren  gel  on all sides of L hand including between fingers- use tylenol  #3 as needed 4/29- pt admits doesn't like taking/using meds all the time- explained I don't have a "fix"- to make it better- have to take meds to have them work 4/30- encouraged again to use voltaren  4x/day for L hand 5/1- pt said using voltaren  gel and tylenol  #3 as needed- adding lidoderm  patches 2 patches at night for back pain 5/2- HA's- will restart SCHEDULED Topamax  75 mg at bedtime- not sure what occurred? Needs to get nightly 5/9- hasn't asked for pain meds this Am including tylenol , so wants to not register the complaint" yet today.  5/11- Add Lyrica  50 mg BID for nerve pain in L hand 5/13- changed to q6pm since pt feels it affected her vision-made it worse 5/14- said pain better today- not clear if due to prednisone  vs Lyrica ? 5/15 pain much better in L hand-  pt feels it's gout? It might be 5/16- hand pain slightly better with ACE wrap overnight- which is fine 5/20- pt reports having migraines every AM, but refusing topamax  last few days- at least 3- will go back and d/w pt and NOT make changes as we discussed 5/21- will reduce Topamax  to 25 mg at bedtime to help with feelinga little confused 5/22- Will stop Topamax  since made her so "loopy"- pt doesn't want any other meds to try to help HA's- said will take Tylenol  5/26- Still having  migraines intermittently 5/27- L hand and wrist pain- radiates to L shoulder- no deformity- but holds hand in vulcan gesture at rest- xrays pending 5/29- 5/30- hand swelling and pain almost back to baseline 4. Depression/Behavior/Sleep: Provide emotional support  4/21 mood improved--continue duloxetine              -antipsychotic agents: N/A 5. Neuropsych/cognition: This patient is capable of making decisions on her own behalf. 6. Skin/Wound Care: Routine skin checks 7. Fluids/Electrolytes/Nutrition:encouraging po.   5/12- I personally reviewed the patient's labs today.     -encouraging PO 8.  Dysphagia.  Dysphagia 3 with thins currently  4/18 intake ok 5/11-5/13- advised pt to take smaller bites-  intake good, but takes too big of bites    5/14- switched back to full supervision- pt unhappy, but wasn't listening when I discussed more of how to eat/take bites 5/16- taking smaller bites with full supervision 5/18 eating 65 to 95% of her meals today 5/22- Back to full supervision- has to sit completely upright to eat 5/23- if this goes better, can go back to intermittent supervision once SLP decided 9.  Hyperlipidemia.  Continue Crestor  10.  Recent History of cocaine/tobacco use.  UDS positive for cocaine.  Provide counseling, this has been a chronic issue , seen in PCP notes 11.  Hypothyroidism. continue Synthroid   12.  Constipation.   Senokot-S 2 tab twice daily  5/6- LBM yesterday even though chart says 5/3  5/7- will add Miralax - esp since increased Duloxetine  which can cause constipation- already on Senna 2 tabs BID  5/9- LBM documented as yesterday- medium BM  5.10- LBM 2 days ago- thinks can go today  LBM 5/12  5/15- LBM yesterday  5/17 Increase miralax  to BID, add sorbitol  PRN  5/18 appears she has been refusing miralax , will check abd xray, suspect constipation.  sorbitol   5/19- up all night having Bms, but cleaned out  5/20- LBM yesterday  5/22- will d/c Miralax - not taking-  and con't Senna  5/23- trying ot have BM on toilet- pt might need intervention if doesn't go by tomorrow  5/26- LBM yesterday  5/29- LBM yesterday 15. Blurry vision and dysconjugate gaze  3/27- will need Ophtho after d/c.   4/4- pt reports everything blurry- cannot see faces as a result- never had eye checked in past- used readers but cannot see at a distance since stroke- will send to Ophtho after d/c.   4/7- d/w pt again- she wants eyeglasses- explained we cannot get when in hospital- also per guidelines, since vision can still change, to not get for 3 months after stroke. She disbelieves it's from stroke, since had 2 prior- that didn't cause vision changes- we educated pt pn how this can occur.   4/25- reminded pt cannot get Optho in hospital- need to get once d/c'd  4/30 went over visual changes with pt- will need to get glasses, likely prisms 2-3 months after d/c once vision stabilizes  5/6- d/w pt again- she wants to see Optho- explained they do not come into hospital for this  5/18 continued blurry vision, discussed outpatient f/u  5/23- pt having blurry vision again this AM, she says slightly worse- will con't to monitor  16.  Left hip pain.  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 Severe HA-daily HA's  4/15-16 sx appear improved with topamax , tylenol  helps also   -may use sx to get out of therapy at times?  4/23- Per team, pt having HA's frequently which impairs therapy- she won't discuss HA's with me- unclear if only has a day goes on vs self limting behavior  4/24- increase  to 75 mg at bedtime  4/25- still having HA's, actually thinks a little worse since not taking Norco for it-   4/28-4/29 fewer complaints about HA this AM  4/30-5/1- - per staff, c/o these less- not interfering with therapy as much  5/2- Wasn't getting topamax - just taking T#3- wil restart Topamax - don't know what occurred with order  5/6- HA's doing better  5/8- not requiring T#3 anymore lately per pt  5/13- Severe HA 5/11 in afternoon- took T#3  5/17 headache today does not sound severe, she has not taken her Tylenol  3 yet but nursing is getting it for her  5/18 headache improved with Tylenol  No. 3 today  5/20- refusing topamax - for past 3+ days- will go back and d/w pt 18. Decreased safety awareness/impulsiveness  4/30- still requiring max cues  5/7- 5/10- no change-per therapy- still requiring max cues  5/23- Still requiring mod-max cues for safety 20. Spasticity  4/22- off baclofen , and c/o LUE pain- wondering how much of her complaints are due to tone?-   4/30- Spasticity improved in LUE- MAS of 0 actually this AM  5/11-12- Getting some spasticity vs disuse tightness inL hand- fingers esp 3rd/4th DIPs- might need Botox after d/c  5/14- MAS of 1 to 1+  in L wrist and fingers- not proximally  5/27- Spasticity slightly worse this AM- esp when yawns- explained that's normal with spasticity due to increased pain 21. 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 .   4/30- pt pulled scab off-  22.  Urinary  and bowel incont due to CVA, cont toileting program 4/23- still cannot control B/B 4/30- on toileting program- is getting somewhat better, but still incontinent more at night  5/13- still incontinent at night  5/18 continued incontinence   23. 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   4/29- will get Dopplers, because pt says she elevated hand overnight- still swollen- ordered  4/30- Dopplers (-)- will get isotoner compression glove  5/4 received glove  5/6- Swelling ~ 50% better- will increase Duloxetine  to 120 mg daily for nerve pain in L hand and mood.   5/7- hand sore this AM- but not "killing her".   5/8- pain great this AM- doesn't need meds other than tylenol  per pt -sounds like increased cymbalta  has helped  5/9- Some pain in L hand this Am, but hasn't taken tylenol  or T#3 yet this Am- 5/13- Will decrease Lyrica  to 50 mg q6pm since caused her to have visual changes early in day  5/14- Pain doing better in last 12+ hours- not as painful  24. C/O catch in breath- no SOB  5/9- asked pt to use ICS every 1 hour while awake- since probably is atelectasis   25. Dry eyes Continue  eye drops as needed for pt 26. Gout? Swelling of L hand  5/13- pt has never had gout but runs in her family- she wants to try something else ot see if would help L hand- will try Colchicine  0.6 mg daily x 4 days as well as Prednisone  20 mg daily x 4 days-  5/14- pt feels pain and swelling are better- not sure if due to gout, or just prednisone  vs power of suggestion- will con't to monitor  5/15- pt feels swelling and pain is better- maybe it is gout? Vs overall improvement with prednisone   27. Vaginal candidiasis  5/15- pt reports Sx's- will give Diflucan  150 mg x1   5/22- no more complaints  28. Worsening urinary incontinence/frequency at night  Discussed that UA and UC are negative  I spent a total of  37   minutes on total care today- >50% coordination of care- due to  D/w charge, nursing mgr and director about telesitter and sequence of events  LOS: 67 days A FACE TO FACE EVALUATION WAS  PERFORMED  Theresa Watkins 04/16/2024, 8:28 AM

## 2024-04-16 NOTE — Plan of Care (Signed)
  Problem: Consults Goal: RH STROKE PATIENT EDUCATION Description: See Patient Education module for education specifics  Outcome: Progressing   Problem: RH BOWEL ELIMINATION Goal: RH STG MANAGE BOWEL WITH ASSISTANCE Description: STG Manage Bowel with toileting Assistance. Outcome: Progressing   Problem: RH SAFETY Goal: RH STG ADHERE TO SAFETY PRECAUTIONS W/ASSISTANCE/DEVICE Description: STG Adhere to Safety Precautions With  cues Assistance/Device. Outcome: Progressing

## 2024-04-16 NOTE — Progress Notes (Signed)
 Occupational Therapy Note  Patient Details  Name: Theresa Watkins MRN: [id number] Date of Birth: 28-Feb-1966  Today's Date: 04/16/2024 OT Missed Time: 60 Minutes Missed Time Reason: Other (comment) (n/v)  Pt sleeping in bed upon arrival. Pt c/o n/v and declined therapy. Offered a shower to pt (she had previously requested) but pt declined. Pt missed 60 mins skilled OT services. Will attempt to see pt as schedule allows.   Andre Kawasaki [facility] 04/16/2024, 9:59 AM

## 2024-04-17 DIAGNOSIS — I63511 Cerebral infarction due to unspecified occlusion or stenosis of right middle cerebral artery: Secondary | ICD-10-CM | POA: Diagnosis not present

## 2024-04-17 NOTE — Plan of Care (Signed)
  Problem: Consults Goal: RH STROKE PATIENT EDUCATION Description: See Patient Education module for education specifics  Outcome: Progressing   Problem: RH BOWEL ELIMINATION Goal: RH STG MANAGE BOWEL WITH ASSISTANCE Description: STG Manage Bowel with toileting Assistance. Outcome: Progressing Goal: RH STG MANAGE BOWEL W/MEDICATION W/ASSISTANCE Description: STG Manage Bowel with Medication with mod I Assistance. Outcome: Progressing   Problem: RH SAFETY Goal: RH STG ADHERE TO SAFETY PRECAUTIONS W/ASSISTANCE/DEVICE Description: STG Adhere to Safety Precautions With cues Assistance/Device. Outcome: Not Progressing Note: Patient is noncompliant with safety measures. Patient tries to reach over bed and slide down to end of bed. Telesitter in place. Patient repositioned to head of bed and bed alarm set.   Problem: RH PAIN MANAGEMENT Goal: RH STG PAIN MANAGED AT OR BELOW PT'S PAIN GOAL Description: Pain managed < 4 with prns Outcome: Progressing   Problem: RH KNOWLEDGE DEFICIT Goal: RH STG INCREASE KNOWLEDGE OF HYPERTENSION Description: Patient and mother will be able to manage HTN using educational resources for medications and dietary modification independently Outcome: Progressing Goal: RH STG INCREASE KNOWLEDGE OF DYSPHAGIA/FLUID INTAKE Description: Patient and mother will be able to manage dysphagia using educational resources for medications and dietary modification independently Outcome: Progressing Goal: RH STG INCREASE KNOWLEGDE OF HYPERLIPIDEMIA Description: Patient and mother will be able to manage HLD using educational resources for medications and dietary modification independently Outcome: Progressing Goal: RH STG INCREASE KNOWLEDGE OF STROKE PROPHYLAXIS Description: Patient and mother will be able to manage secondary risks using educational resources for medications and dietary modification independently Outcome: Progressing   Problem: Consults Goal: RH GENERAL PATIENT  EDUCATION Description: See Patient Education module for education specifics. Outcome: Progressing Goal: Skin Care Protocol Initiated - if Braden Score 18 or less Description: If consults are not indicated, leave blank or document N/A Outcome: Progressing Goal: Nutrition Consult-if indicated Outcome: Progressing Goal: Diabetes Guidelines if Diabetic/Glucose > 140 Description: If diabetic or lab glucose is > 140 mg/dl - Initiate Diabetes/Hyperglycemia Guidelines & Document Interventions  Outcome: Progressing   Problem: RH BOWEL ELIMINATION Goal: RH STG MANAGE BOWEL WITH ASSISTANCE Description: STG Manage Bowel with Assistance. Outcome: Progressing Goal: RH STG MANAGE BOWEL W/MEDICATION W/ASSISTANCE Description: STG Manage Bowel with Medication with Assistance. Outcome: Progressing   Problem: RH BLADDER ELIMINATION Goal: RH STG MANAGE BLADDER WITH ASSISTANCE Description: STG Manage Bladder With Assistance Outcome: Progressing Goal: RH STG MANAGE BLADDER WITH MEDICATION WITH ASSISTANCE Description: STG Manage Bladder With Medication With Assistance. Outcome: Progressing Goal: RH STG MANAGE BLADDER WITH EQUIPMENT WITH ASSISTANCE Description: STG Manage Bladder With Equipment With Assistance Outcome: Progressing   Problem: RH SKIN INTEGRITY Goal: RH STG SKIN FREE OF INFECTION/BREAKDOWN Outcome: Progressing Goal: RH STG MAINTAIN SKIN INTEGRITY WITH ASSISTANCE Description: STG Maintain Skin Integrity With Assistance. Outcome: Progressing Goal: RH STG ABLE TO PERFORM INCISION/WOUND CARE W/ASSISTANCE Description: STG Able To Perform Incision/Wound Care With Assistance. Outcome: Progressing   Problem: RH SAFETY Goal: RH STG ADHERE TO SAFETY PRECAUTIONS W/ASSISTANCE/DEVICE Description: STG Adhere to Safety Precautions With Assistance/Device. Outcome: Progressing Goal: RH STG DECREASED RISK OF FALL WITH ASSISTANCE Description: STG Decreased Risk of Fall With Assistance. Outcome:  Progressing   Problem: RH PAIN MANAGEMENT Goal: RH STG PAIN MANAGED AT OR BELOW PT'S PAIN GOAL Outcome: Progressing   Problem: RH KNOWLEDGE DEFICIT GENERAL Goal: RH STG INCREASE KNOWLEDGE OF SELF CARE AFTER HOSPITALIZATION Outcome: Progressing

## 2024-04-17 NOTE — Progress Notes (Signed)
 PROGRESS NOTE   Subjective/Complaints:  No events overnight.  Did decline therapy yesterday due to being tired. Vital stable.  Adequate p.o. intakes. Mostly continent of bowel bladder, did have 1 episode of urinary incontinence with large bowel movement yesterday.  Only used Tylenol  with codeine  once in the last couple of days; patient states she still wants this on board  ROS:    Pt denies SOB, abd pain, CP, N/V/C/D, and vision changes  L hand pain/swelling- s stable vision a little burry-continued-worse when having migraines-stable  Objective:   No results found.       No results for input(s): "WBC", "HGB", "HCT", "PLT" in the last 72 hours.          No results for input(s): "NA", "K", "CL", "CO2", "GLUCOSE", "BUN", "CREATININE", "CALCIUM " in the last 72 hours.          Intake/Output Summary (Last 24 hours) at 04/17/2024 0950 Last data filed at 04/16/2024 1057 Gross per 24 hour  Intake 474 ml  Output --  Net 474 ml            Physical Exam: Vital Signs Blood pressure 106/66, pulse (!) 101, temperature 98 F (36.7 C), temperature source Oral, resp. rate 18, height 5\' 6"  (1.676 m), weight 72.3 kg, SpO2 92%.  General: awake, alert, appropriate, laying flat in bed;  NAD HENT: conjugate gaze; oropharynx moist CV: regular rate and rhythm; no JVD Pulmonary: CTA B/L; no W/R/R- good air movement GI: soft, NT, ND, (+)BS Psychiatric: Calm, appropriate mood and affect Neurological: Ox3- poor safety awareness Moderate dysarthria  MSK- L hand less swelling- looks even better today- in WHO; - tips of fingers wont' fully extend- tiny bit of flexion at tips MAS of 1- 1+ in L wrist and hand/fingers MSK-L hand Less TTP- less swelling- not wearing glove today Neuro: cognitively at baseline. Alert. Sl dysarthria. CN without changes. Able to count fingers. MAS of 0-1 in LUE Extremities: L hand - less  swelling and using to help feed herself Motor strength 0/5 LUE 3-/5 LLE 5/5 on right side  Skin- L PIP of 2nd digit- healing scab, stable 5/25  Physical exam unchanged from the above on reexamination 04/17/24    Assessment/Plan: 1. Functional deficits which require 3+ hours per day of interdisciplinary therapy in a comprehensive inpatient rehab setting. Physiatrist is providing close team supervision and 24 hour management of active medical problems listed below. Physiatrist and rehab team continue to assess barriers to discharge/monitor patient progress toward functional and medical goals  Care Tool:  Bathing    Body parts bathed by patient: Left arm, Chest, Abdomen, Front perineal area, Buttocks, Right upper leg, Left upper leg, Right lower leg, Left lower leg, Face   Body parts bathed by helper: Right arm Body parts n/a: Right arm   Bathing assist Assist Level: Minimal Assistance - Patient > 75%     Upper Body Dressing/Undressing Upper body dressing   What is the patient wearing?: Pull over shirt    Upper body assist Assist Level: Supervision/Verbal cueing    Lower Body Dressing/Undressing Lower body dressing      What is the patient wearing?: Pants, Underwear/pull up  Lower body assist Assist for lower body dressing: Minimal Assistance - Patient > 75%     Toileting Toileting    Toileting assist Assist for toileting: Minimal Assistance - Patient > 75%     Transfers Chair/bed transfer  Transfers assist     Chair/bed transfer assist level: Contact Guard/Touching assist     Locomotion Ambulation   Ambulation assist      Assist level: Minimal Assistance - Patient > 75% Assistive device: Walker-rolling Max distance: 170   Walk 10 feet activity   Assist     Assist level: Minimal Assistance - Patient > 75% Assistive device: Walker-hemi   Walk 50 feet activity   Assist Walk 50 feet with 2 turns activity did not occur: Safety/medical  concerns (L hemiplegia and pt fatigue)  Assist level: Minimal Assistance - Patient > 75% Assistive device: Walker-hemi    Walk 150 feet activity   Assist Walk 150 feet activity did not occur: Safety/medical concerns (L hemiplegia and pt fatigue)  Assist level: Minimal Assistance - Patient > 75% Assistive device: Walker-hemi    Walk 10 feet on uneven surface  activity   Assist Walk 10 feet on uneven surfaces activity did not occur: Safety/medical concerns (L hemiplegia and pt fatigue)         Wheelchair     Assist Is the patient using a wheelchair?: Yes Type of Wheelchair: Manual    Wheelchair assist level: Supervision/Verbal cueing Max wheelchair distance: 150    Wheelchair 50 feet with 2 turns activity    Assist        Assist Level: Supervision/Verbal cueing   Wheelchair 150 feet activity     Assist      Assist Level: Supervision/Verbal cueing   Blood pressure 106/66, pulse (!) 101, temperature 98 F (36.7 C), temperature source Oral, resp. rate 18, height 5\' 6"  (1.676 m), weight 72.3 kg, SpO2 92%.   Medical Problem List and Plan: 1. 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 Search for facility ongoing. May only be able to go to custodial care facility  D/c SNF-  Con't CIR- PT, OT and SLP 1x/day Pt wants to get rif otelesitter placed yesterday- cannot do that right now- found on floor yesterday - she reports didn't fall, but was "looking for shoe" Will con't telesitter for 1 week and re-eval then    2. DVT/anticoagulation:  Pharmaceutical: Lovenox              -antiplatelet therapy: Aspirin  81 mg daily and Plavix  75 mg daily x 3 weeks then Plavix  alone  3. Pain Management:  Tylenol  as needed  3/25- denies pain- con't regimen rpn   3-30: Ongoing left hip pain; Voltaren  gel and Tylenol .  4/3- having new burning pain in LUE- which is seen sometimes with stroke- will add Duloxetine  30 mg daily for nerve pain- monitor for side effects.  -if gets nausea, will change to Lyrica  4/4-6 no side effects so far-  4/8- will increase Duloxetine  to 60 mg daily  DC hydrocodone  due to side effects, trial tyl #3  as needed for pain that is not relieved by Tylenol . 4/28- advised pt to use voltaren  gel  on all sides of L hand including between fingers- use tylenol  #3 as needed 4/29- pt admits doesn't like taking/using meds all the time- explained I don't have a "fix"- to make it better- have to take meds to have them work 4/30- encouraged again to use voltaren  4x/day for L hand 5/1- pt said using voltaren  gel and tylenol  #3 as needed- adding lidoderm  patches 2 patches at night for back pain 5/2- HA's- will restart SCHEDULED Topamax  75 mg at bedtime- not sure what occurred? Needs to get nightly 5/9- hasn't asked for pain meds this Am including tylenol , so wants to not register the complaint" yet today.  5/11- Add Lyrica  50 mg BID for nerve pain in L hand 5/13- changed to q6pm since pt feels it affected her vision-made it worse 5/14- said pain better today- not clear if due to prednisone  vs Lyrica ? 5/15 pain much better in L hand-  pt feels it's gout? It might be 5/16- hand pain slightly better with ACE wrap overnight- which is fine 5/20- pt reports having migraines every AM, but refusing topamax  last few days- at least 3- will go back and d/w pt and NOT make changes as we discussed 5/21- will reduce Topamax  to 25 mg at bedtime to help with feelinga little confused 5/22- Will stop Topamax  since made her so "loopy"- pt doesn't want any other meds to try to help HA's- said will take Tylenol  5/26- Still having migraines intermittently 5/27- L hand and wrist pain- radiates to L shoulder-  no deformity- but holds hand in vulcan gesture at rest- xrays pending 5/29- 5/30- hand swelling and pain almost back to baseline 5-31: Last use of as needed Tylenol  with codeine  5-29, patient wishes to keep on board.  Defer to primary team  4. Depression/Behavior/Sleep: Provide emotional support  4/21 mood improved--continue duloxetine              -antipsychotic agents: N/A 5. Neuropsych/cognition: This patient is capable of making decisions on her own behalf. 6. Skin/Wound Care: Routine skin checks 7. Fluids/Electrolytes/Nutrition:encouraging po.   5/12- I personally reviewed the patient's labs today.     -encouraging PO 8.  Dysphagia.  Dysphagia 3 with thins currently  4/18 intake ok 5/11-5/13- advised pt to take smaller bites-  intake good, but takes too big of bites    5/14- switched back to full supervision- pt unhappy, but wasn't listening when I discussed more of how to eat/take bites 5/16- taking smaller bites with full supervision 5/18 eating 65 to 95% of her meals today 5/22- Back to full supervision- has to sit completely upright to eat 5/23- if this goes better, can go back to intermittent supervision once SLP decided 5-31: P.o. intakes appear adequate.  9.  Hyperlipidemia.  Continue Crestor  10.  Recent History of cocaine/tobacco use.  UDS positive for cocaine.  Provide counseling, this has been a chronic issue , seen in PCP notes 11.  Hypothyroidism. continue Synthroid   12.  Constipation.   Senokot-S 2 tab twice daily  5/6- LBM yesterday even though chart says 5/3  5/7- will add Miralax - esp since increased Duloxetine  which can cause constipation- already on Senna 2 tabs BID  5/9- LBM documented as yesterday- medium BM  5.10- LBM 2 days ago- thinks can go today  LBM 5/12  5/15- LBM yesterday  5/17 Increase miralax  to BID, add sorbitol  PRN  5/18 appears she has been refusing miralax , will check abd xray, suspect constipation. sorbitol   5/19- up all night having Bms, but  cleaned out  5/20- LBM yesterday  5/22- will d/c Miralax - not taking- and con't Senna  5/23- trying ot have BM on toilet- pt might need intervention if doesn't go by tomorrow  5-30: Last bowel movement  15. Blurry vision and dysconjugate gaze  3/27- will need Ophtho after d/c.   4/4- pt reports everything blurry- cannot see faces as a result- never had eye checked in past- used readers but cannot see at a distance since stroke- will send to Ophtho after d/c.   4/7- d/w pt again- she wants eyeglasses- explained we cannot get when in hospital- also per guidelines, since vision can still change, to not get for 3 months after stroke. She disbelieves it's from stroke, since had 2 prior- that didn't cause vision changes- we educated pt pn how this can occur.   4/25- reminded pt cannot get Optho in hospital- need to get once d/c'd  4/30 went over visual changes with pt- will need to get glasses, likely prisms 2-3 months after d/c once vision stabilizes  5/6- d/w pt again- she wants to see Optho- explained they do not come into hospital for this  5/18 continued blurry vision, discussed outpatient f/u  5/23- pt having blurry vision again this AM, she says slightly worse- will con't to monitor  5-31: No complaints of blurry vision  16.  Left hip pain.  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 Severe HA-daily HA's  4/15-16 sx appear improved with topamax , tylenol  helps also   -may use sx to get out of therapy at times?  4/23- Per team, pt having HA's frequently which impairs therapy- she won't  discuss HA's with me- unclear if only has a day goes on vs self limting behavior  4/24- increase to 75 mg at bedtime  4/25- still having HA's, actually thinks a little worse since not taking Norco for it-   4/28-4/29 fewer complaints about HA this AM  4/30-5/1- - per staff, c/o these less- not interfering with therapy as much  5/2- Wasn't getting topamax - just taking T#3- wil restart Topamax - don't know what occurred with order  5/6- HA's doing better  5/8- not requiring T#3 anymore lately per pt  5/13- Severe HA 5/11 in afternoon- took T#3  5/17 headache today does not sound severe, she has not taken her Tylenol  3 yet but nursing is getting it for her  5/18 headache improved with Tylenol  No. 3 today  5/20- refusing topamax - for past 3+ days- will go back and d/w pt  18. Decreased safety awareness/impulsiveness  4/30- still requiring max cues  5/7- 5/10- no change-per therapy- still requiring max cues  5/23- Still requiring mod-max cues for safety  20. Spasticity  4/22- off baclofen , and c/o LUE pain- wondering how much of her complaints are due to tone?-   4/30- Spasticity improved in LUE- MAS of 0 actually this AM  5/11-12- Getting some spasticity vs disuse tightness inL hand- fingers esp 3rd/4th DIPs- might need Botox after d/c  5/14- MAS of 1 to 1+ in L wrist  and fingers- not proximally  5/27- Spasticity slightly worse this AM- esp when yawns- explained that's normal with spasticity due to increased pain  21. 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 .   4/30- pt pulled scab off-  22.  Urinary  and bowel incont due to CVA, cont toileting program 4/23- still cannot control B/B 4/30- on toileting program- is getting somewhat better, but still incontinent more at night  5/13- still incontinent at night  5/18 continued incontinence   23. 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   4/29- will get Dopplers, because pt says she elevated hand overnight- still swollen- ordered  4/30- Dopplers (-)- will get isotoner compression glove  5/4 received glove  5/6- Swelling ~ 50% better- will increase Duloxetine  to 120 mg daily for nerve pain in L hand and mood.   5/7- hand sore this AM- but not "killing her".   5/8- pain great this AM- doesn't need meds other than tylenol  per pt -sounds like increased cymbalta  has helped  5/9- Some pain in L hand this Am, but hasn't taken tylenol  or T#3 yet this Am- 5/13- Will decrease Lyrica  to 50 mg q6pm since caused her to have visual changes early in day  5/14- Pain doing better in last 12+ hours- not as painful  24. C/O catch in breath- no SOB  5/9- asked pt to use ICS every 1 hour while awake- since probably is atelectasis   25. Dry eyes Continue  eye drops as needed for pt  26. Gout? Swelling of L hand  5/13- pt has never had gout but runs in her family- she wants to try something else ot see if would help L hand- will try Colchicine  0.6 mg daily x 4 days as well as Prednisone  20 mg daily x 4 days-  5/14- pt feels pain and swelling are better- not sure if due to gout, or just prednisone  vs power of suggestion- will con't to monitor  5/15- pt feels swelling and pain is better- maybe it is gout? Vs overall improvement with prednisone   27. Vaginal candidiasis  5/15- pt reports Sx's- will give Diflucan  150 mg x1   5/22- no more complaints  28. Worsening urinary incontinence/frequency at night  Discussed that UA and UC are negative  - 5-31: Mostly continent.  Monitor.  LOS: 68 days A FACE TO FACE EVALUATION WAS PERFORMED  Bea Lime 04/17/2024,  9:50 AM

## 2024-04-17 NOTE — Progress Notes (Signed)
 Physical Therapy Session Note  Patient Details  Name: Theresa Watkins MRN: [id number] Date of Birth: 01/03/1966  Today's Date: 04/17/2024 PT Missed Time:   Missed Time Reason:    Short Term Goals: Week 8: PT Short Term Goal 1 (Week 9): Pt will perform floor transfer with supervision PT Short Term Goal 2 (Week 9): Pt will perform stair navigation with min a or better  Skilled Therapeutic Interventions/Progress Updates:  Pt missed of skilled therapy due to complaint of n/v this morning.  She politely declines attempts for bed level therex or theract. Will re-attempt as schedule and pt availability permits.  Therapy Documentation Precautions:  Precautions Precautions: Fall Precaution/Restrictions Comments: L hemi Restrictions Weight Bearing Restrictions Per Provider Order: No RUE Weight Bearing Per Provider Order: Partial weight bearing  Pain:  Stomach pain from n/v. Nsg aware.   Therapy/Group: Individual Therapy  Donne Gage PT, DPT, CSRS 04/17/2024, 6:43 PM

## 2024-04-18 DIAGNOSIS — I63511 Cerebral infarction due to unspecified occlusion or stenosis of right middle cerebral artery: Secondary | ICD-10-CM | POA: Diagnosis not present

## 2024-04-18 NOTE — Plan of Care (Signed)
  Problem: Consults Goal: RH STROKE PATIENT EDUCATION Description: See Patient Education module for education specifics  Outcome: Progressing   Problem: RH BOWEL ELIMINATION Goal: RH STG MANAGE BOWEL WITH ASSISTANCE Description: STG Manage Bowel with toileting Assistance. Outcome: Progressing Goal: RH STG MANAGE BOWEL W/MEDICATION W/ASSISTANCE Description: STG Manage Bowel with Medication with mod I Assistance. Outcome: Progressing   Problem: RH SAFETY Goal: RH STG ADHERE TO SAFETY PRECAUTIONS W/ASSISTANCE/DEVICE Description: STG Adhere to Safety Precautions With cues Assistance/Device. Outcome: Progressing   Problem: RH PAIN MANAGEMENT Goal: RH STG PAIN MANAGED AT OR BELOW PT'S PAIN GOAL Description: Pain managed < 4 with prns Outcome: Progressing   Problem: RH KNOWLEDGE DEFICIT Goal: RH STG INCREASE KNOWLEDGE OF HYPERTENSION Description: Patient and mother will be able to manage HTN using educational resources for medications and dietary modification independently Outcome: Progressing Goal: RH STG INCREASE KNOWLEDGE OF DYSPHAGIA/FLUID INTAKE Description: Patient and mother will be able to manage dysphagia using educational resources for medications and dietary modification independently Outcome: Progressing Goal: RH STG INCREASE KNOWLEGDE OF HYPERLIPIDEMIA Description: Patient and mother will be able to manage HLD using educational resources for medications and dietary modification independently Outcome: Progressing Goal: RH STG INCREASE KNOWLEDGE OF STROKE PROPHYLAXIS Description: Patient and mother will be able to manage secondary risks using educational resources for medications and dietary modification independently Outcome: Progressing   Problem: Consults Goal: RH GENERAL PATIENT EDUCATION Description: See Patient Education module for education specifics. Outcome: Progressing Goal: Skin Care Protocol Initiated - if Braden Score 18 or less Description: If consults are  not indicated, leave blank or document N/A Outcome: Progressing Goal: Nutrition Consult-if indicated Outcome: Progressing Goal: Diabetes Guidelines if Diabetic/Glucose > 140 Description: If diabetic or lab glucose is > 140 mg/dl - Initiate Diabetes/Hyperglycemia Guidelines & Document Interventions  Outcome: Progressing   Problem: RH BOWEL ELIMINATION Goal: RH STG MANAGE BOWEL WITH ASSISTANCE Description: STG Manage Bowel with Assistance. Outcome: Progressing Goal: RH STG MANAGE BOWEL W/MEDICATION W/ASSISTANCE Description: STG Manage Bowel with Medication with Assistance. Outcome: Progressing   Problem: RH BLADDER ELIMINATION Goal: RH STG MANAGE BLADDER WITH ASSISTANCE Description: STG Manage Bladder With Assistance Outcome: Progressing Goal: RH STG MANAGE BLADDER WITH MEDICATION WITH ASSISTANCE Description: STG Manage Bladder With Medication With Assistance. Outcome: Progressing Goal: RH STG MANAGE BLADDER WITH EQUIPMENT WITH ASSISTANCE Description: STG Manage Bladder With Equipment With Assistance Outcome: Progressing   Problem: RH SKIN INTEGRITY Goal: RH STG SKIN FREE OF INFECTION/BREAKDOWN Outcome: Progressing Goal: RH STG MAINTAIN SKIN INTEGRITY WITH ASSISTANCE Description: STG Maintain Skin Integrity With Assistance. Outcome: Progressing Goal: RH STG ABLE TO PERFORM INCISION/WOUND CARE W/ASSISTANCE Description: STG Able To Perform Incision/Wound Care With Assistance. Outcome: Progressing   Problem: RH SAFETY Goal: RH STG ADHERE TO SAFETY PRECAUTIONS W/ASSISTANCE/DEVICE Description: STG Adhere to Safety Precautions With Assistance/Device. Outcome: Progressing Goal: RH STG DECREASED RISK OF FALL WITH ASSISTANCE Description: STG Decreased Risk of Fall With Assistance. Outcome: Progressing   Problem: RH PAIN MANAGEMENT Goal: RH STG PAIN MANAGED AT OR BELOW PT'S PAIN GOAL Outcome: Progressing   Problem: RH KNOWLEDGE DEFICIT GENERAL Goal: RH STG INCREASE KNOWLEDGE OF  SELF CARE AFTER HOSPITALIZATION Outcome: Progressing

## 2024-04-18 NOTE — Progress Notes (Signed)
 PROGRESS NOTE   Subjective/Complaints:  Incontinent again overnight, otherwise no events.  No acute complaints, doing well. Vitals stable LBM 5/31  ROS:    Pt denies SOB, abd pain, CP, N/V/C/D, and vision changes Objective:   No results found.       No results for input(s): "WBC", "HGB", "HCT", "PLT" in the last 72 hours.          No results for input(s): "NA", "K", "CL", "CO2", "GLUCOSE", "BUN", "CREATININE", "CALCIUM " in the last 72 hours.          Intake/Output Summary (Last 24 hours) at 04/18/2024 0952 Last data filed at 04/17/2024 1218 Gross per 24 hour  Intake 236 ml  Output --  Net 236 ml            Physical Exam: Vital Signs Blood pressure 115/70, pulse 79, temperature 98.1 F (36.7 C), temperature source Oral, resp. rate 18, height 5\' 6"  (1.676 m), weight 72.3 kg, SpO2 91%.  General: awake, alert, appropriate, laying flat in bed;  NAD HENT: conjugate gaze; oropharynx moist CV: regular rate and rhythm; no JVD Pulmonary: CTA B/L; no W/R/R- good air movement GI: soft, NT, ND, (+)BS Psychiatric: Calm, appropriate mood and affect Neurological: Ox3- poor safety awareness Moderate dysarthria  MSK- L hand less swelling- looks even better today- in WHO; - tips of fingers wont' fully extend- tiny bit of flexion at tips MAS of 1- 1+ in L wrist and hand/fingers MSK-L hand Less TTP- less swelling- not wearing glove today Neuro: cognitively at baseline. Alert. Sl dysarthria. CN without changes. Able to count fingers. MAS of 0-1 in LUE Extremities: L hand - less swelling and using to help feed herself Motor strength 0/5 LUE 3-/5 LLE 5/5 on right side  Skin-no apparent lesions  Physical exam unchanged from the above on reexamination 04/18/24    Assessment/Plan: 1. Functional deficits which require 3+ hours per day of interdisciplinary therapy in a comprehensive inpatient rehab  setting. Physiatrist is providing close team supervision and 24 hour management of active medical problems listed below. Physiatrist and rehab team continue to assess barriers to discharge/monitor patient progress toward functional and medical goals  Care Tool:  Bathing    Body parts bathed by patient: Left arm, Chest, Abdomen, Front perineal area, Buttocks, Right upper leg, Left upper leg, Right lower leg, Left lower leg, Face   Body parts bathed by helper: Right arm Body parts n/a: Right arm   Bathing assist Assist Level: Minimal Assistance - Patient > 75%     Upper Body Dressing/Undressing Upper body dressing   What is the patient wearing?: Pull over shirt    Upper body assist Assist Level: Supervision/Verbal cueing    Lower Body Dressing/Undressing Lower body dressing      What is the patient wearing?: Pants, Underwear/pull up     Lower body assist Assist for lower body dressing: Minimal Assistance - Patient > 75%     Toileting Toileting    Toileting assist Assist for toileting: Minimal Assistance - Patient > 75%     Transfers Chair/bed transfer  Transfers assist     Chair/bed transfer assist level: Contact Guard/Touching assist  Locomotion Ambulation   Ambulation assist      Assist level: Minimal Assistance - Patient > 75% Assistive device: Walker-rolling Max distance: 170   Walk 10 feet activity   Assist     Assist level: Minimal Assistance - Patient > 75% Assistive device: Walker-hemi   Walk 50 feet activity   Assist Walk 50 feet with 2 turns activity did not occur: Safety/medical concerns (L hemiplegia and pt fatigue)  Assist level: Minimal Assistance - Patient > 75% Assistive device: Walker-hemi    Walk 150 feet activity   Assist Walk 150 feet activity did not occur: Safety/medical concerns (L hemiplegia and pt fatigue)  Assist level: Minimal Assistance - Patient > 75% Assistive device: Walker-hemi    Walk 10 feet on  uneven surface  activity   Assist Walk 10 feet on uneven surfaces activity did not occur: Safety/medical concerns (L hemiplegia and pt fatigue)         Wheelchair     Assist Is the patient using a wheelchair?: Yes Type of Wheelchair: Manual    Wheelchair assist level: Supervision/Verbal cueing Max wheelchair distance: 150    Wheelchair 50 feet with 2 turns activity    Assist        Assist Level: Supervision/Verbal cueing   Wheelchair 150 feet activity     Assist      Assist Level: Supervision/Verbal cueing   Blood pressure 115/70, pulse 79, temperature 98.1 F (36.7 C), temperature source Oral, resp. rate 18, height 5\' 6"  (1.676 m), weight 72.3 kg, SpO2 91%.   Medical Problem List and Plan: 1. 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 Search for facility ongoing. May only be able to go to custodial care facility  D/c SNF-  Con't CIR- PT, OT and SLP 1x/day Pt wants to get rif otelesitter placed yesterday- cannot do that right now- found on floor yesterday - she reports didn't fall, but was "looking for shoe" Will con't telesitter for 1 week and re-eval then    2. DVT/anticoagulation:  Pharmaceutical: Lovenox              -antiplatelet therapy: Aspirin  81 mg daily and Plavix  75 mg daily x 3 weeks then Plavix  alone  3. Pain Management: Tylenol  as needed  3/25- denies pain- con't regimen rpn   3-30: Ongoing left hip pain; Voltaren  gel and Tylenol .  4/3- having new burning pain in LUE- which is seen sometimes with stroke- will add Duloxetine  30 mg daily for nerve pain- monitor for side effects.  -if gets nausea, will change to Lyrica  4/4-6 no side effects so far-  4/8- will increase  Duloxetine  to 60 mg daily  DC hydrocodone  due to side effects, trial tyl #3  as needed for pain that is not relieved by Tylenol . 4/28- advised pt to use voltaren  gel  on all sides of L hand including between fingers- use tylenol  #3 as needed 4/29- pt admits doesn't like taking/using meds all the time- explained I don't have a "fix"- to make it better- have to take meds to have them work 4/30- encouraged again to use voltaren  4x/day for L hand 5/1- pt said using voltaren  gel and tylenol  #  3 as needed- adding lidoderm  patches 2 patches at night for back pain 5/2- HA's- will restart SCHEDULED Topamax  75 mg at bedtime- not sure what occurred? Needs to get nightly 5/9- hasn't asked for pain meds this Am including tylenol , so wants to not register the complaint" yet today.  5/11- Add Lyrica  50 mg BID for nerve pain in L hand 5/13- changed to q6pm since pt feels it affected her vision-made it worse 5/14- said pain better today- not clear if due to prednisone  vs Lyrica ? 5/15 pain much better in L hand-  pt feels it's gout? It might be 5/16- hand pain slightly better with ACE wrap overnight- which is fine 5/20- pt reports having migraines every AM, but refusing topamax  last few days- at least 3- will go back and d/w pt and NOT make changes as we discussed 5/21- will reduce Topamax  to 25 mg at bedtime to help with feelinga little confused 5/22- Will stop Topamax  since made her so "loopy"- pt doesn't want any other meds to try to help HA's- said will take Tylenol  5/26- Still having migraines intermittently 5/27- L hand and wrist pain- radiates to L shoulder- no deformity- but holds hand in vulcan gesture at rest- xrays pending 5/29- 5/30- hand swelling and pain almost back to baseline 5-31: Last use of as needed Tylenol  with codeine  5-29, patient wishes to keep on board.  Defer to primary team  4. Depression/Behavior/Sleep: Provide emotional support  4/21 mood improved--continue duloxetine               -antipsychotic agents: N/A 5. Neuropsych/cognition: This patient is capable of making decisions on her own behalf. 6. Skin/Wound Care: Routine skin checks 7. Fluids/Electrolytes/Nutrition:encouraging po.   5/12- I personally reviewed the patient's labs today.     -encouraging PO 8.  Dysphagia.  Dysphagia 3 with thins currently  4/18 intake ok 5/11-5/13- advised pt to take smaller bites-  intake good, but takes too big of bites    5/14- switched back to full supervision- pt unhappy, but wasn't listening when I discussed more of how to eat/take bites 5/16- taking smaller bites with full supervision 5/18 eating 65 to 95% of her meals today 5/22- Back to full supervision- has to sit completely upright to eat 5/23- if this goes better, can go back to intermittent supervision once SLP decided 5-31: P.o. intakes appear adequate.  9.  Hyperlipidemia.  Continue Crestor  10.  Recent History of cocaine/tobacco use.  UDS positive for cocaine.  Provide counseling, this has been a chronic issue , seen in PCP notes 11.  Hypothyroidism. continue Synthroid   12.  Constipation.   Senokot-S 2 tab twice daily  5/6- LBM yesterday even though chart says 5/3  5/7- will add Miralax - esp since increased Duloxetine  which can cause constipation- already on Senna 2 tabs BID  5/9- LBM documented as yesterday- medium BM  5.10- LBM 2 days ago- thinks can go today  LBM 5/12  5/15- LBM yesterday  5/17 Increase miralax  to BID, add sorbitol  PRN  5/18 appears she has been refusing miralax , will check abd xray, suspect constipation. sorbitol   5/19- up all night having Bms, but cleaned out  5/20- LBM yesterday  5/22- will d/c Miralax - not taking- and con't Senna  5/23- trying ot have BM on toilet- pt might need intervention if doesn't go by tomorrow  5-31: Last bowel movement, medium  15. Blurry vision and dysconjugate gaze  3/27- will need Ophtho after d/c.   4/4- pt reports everything blurry-  cannot see faces as a  result- never had eye checked in past- used readers but cannot see at a distance since stroke- will send to Ophtho after d/c.   4/7- d/w pt again- she wants eyeglasses- explained we cannot get when in hospital- also per guidelines, since vision can still change, to not get for 3 months after stroke. She disbelieves it's from stroke, since had 2 prior- that didn't cause vision changes- we educated pt pn how this can occur.   4/25- reminded pt cannot get Optho in hospital- need to get once d/c'd  4/30 went over visual changes with pt- will need to get glasses, likely prisms 2-3 months after d/c once vision stabilizes  5/6- d/w pt again- she wants to see Optho- explained they do not come into hospital for this  5/18 continued blurry vision, discussed outpatient f/u  5/23- pt having blurry vision again this AM, she says slightly worse- will con't to monitor  5-31: No complaints of blurry vision  16.  Left hip pain.  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 Severe HA-daily HA's  4/15-16 sx appear improved with topamax , tylenol  helps also   -may use sx to get out of therapy at times?  4/23- Per team, pt having HA's frequently which impairs therapy- she won't discuss HA's with me- unclear if only has a day goes on vs self limting behavior  4/24- increase to 75 mg at bedtime  4/25- still having HA's, actually thinks a little worse since not taking Norco for it-   4/28-4/29 fewer complaints about HA this AM  4/30-5/1- - per staff, c/o these less- not interfering with therapy as much  5/2- Wasn't  getting topamax - just taking T#3- wil restart Topamax - don't know what occurred with order  5/6- HA's doing better  5/8- not requiring T#3 anymore lately per pt  5/13- Severe HA 5/11 in afternoon- took T#3  5/17 headache today does not sound severe, she has not taken her Tylenol  3 yet but nursing is getting it for her  5/18 headache improved with Tylenol  No. 3 today  5/20- refusing topamax - for past 3+ days- will go back and d/w pt  18. Decreased safety awareness/impulsiveness  4/30- still requiring max cues  5/7- 5/10- no change-per therapy- still requiring max cues  5/23- Still requiring mod-max cues for safety  20. Spasticity  4/22- off baclofen , and c/o LUE pain- wondering how much of her complaints are due to tone?-   4/30- Spasticity improved in LUE- MAS of 0 actually this AM  5/11-12- Getting some spasticity vs disuse tightness inL hand- fingers esp 3rd/4th DIPs- might need Botox after d/c  5/14- MAS of 1 to 1+ in L wrist and fingers- not proximally  5/27- Spasticity slightly worse this AM- esp when yawns- explained that's normal with spasticity due to increased pain  21. 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 .   4/30-  pt pulled scab off-  22.  Urinary  and bowel incont due to CVA, cont toileting program 4/23- still cannot control B/B 4/30- on toileting program- is getting somewhat better, but still incontinent more at night  5/13- still incontinent at night  5/18 continued incontinence   23. 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   4/29- will get Dopplers, because pt says she elevated hand  overnight- still swollen- ordered  4/30- Dopplers (-)- will get isotoner compression glove  5/4 received glove  5/6- Swelling ~ 50% better- will increase Duloxetine  to 120 mg daily for nerve pain in L hand and mood.   5/7- hand sore this AM- but not "killing her".   5/8- pain great this AM- doesn't need meds other than tylenol  per pt -sounds like increased cymbalta  has helped  5/9- Some pain in L hand this Am, but hasn't taken tylenol  or T#3 yet this Am- 5/13- Will decrease Lyrica  to 50 mg q6pm since caused her to have visual changes early in day  5/14- Pain doing better in last 12+ hours- not as painful  24. C/O catch in breath- no SOB  5/9- asked pt to use ICS every 1 hour while awake- since probably is atelectasis   25. Dry eyes Continue  eye drops as needed for pt  26. Gout? Swelling of L hand  5/13- pt has never had gout but runs in her family- she wants to try something else ot see if would help L hand- will try Colchicine  0.6 mg daily x 4 days as well as Prednisone  20 mg daily x 4 days-  5/14- pt feels pain and swelling are better- not sure if due to gout, or just prednisone  vs power of suggestion- will con't to monitor  5/15- pt feels swelling and pain is better- maybe it is gout? Vs overall improvement with prednisone   27. Vaginal candidiasis  5/15- pt reports Sx's- will give Diflucan  150 mg x1   5/22- no more complaints  28. Worsening urinary incontinence/frequency at night  Discussed that UA and UC are negative  - 5-31, 6/1: Mostly continent except at nighttime.  Monitor.  LOS: 69 days A FACE TO FACE EVALUATION WAS PERFORMED  Bea Lime 04/18/2024, 9:52 AM

## 2024-04-19 DIAGNOSIS — I63511 Cerebral infarction due to unspecified occlusion or stenosis of right middle cerebral artery: Secondary | ICD-10-CM | POA: Diagnosis not present

## 2024-04-19 LAB — CBC WITH DIFFERENTIAL/PLATELET
Abs Immature Granulocytes: 0.02 10*3/uL (ref 0.00–0.07)
Basophils Absolute: 0 10*3/uL (ref 0.0–0.1)
Basophils Relative: 0 %
Eosinophils Absolute: 0.1 10*3/uL (ref 0.0–0.5)
Eosinophils Relative: 2 %
HCT: 42.2 % (ref 36.0–46.0)
Hemoglobin: 13.4 g/dL (ref 12.0–15.0)
Immature Granulocytes: 0 %
Lymphocytes Relative: 38 %
Lymphs Abs: 2.2 10*3/uL (ref 0.7–4.0)
MCH: 28.4 pg (ref 26.0–34.0)
MCHC: 31.8 g/dL (ref 30.0–36.0)
MCV: 89.4 fL (ref 80.0–100.0)
Monocytes Absolute: 0.5 10*3/uL (ref 0.1–1.0)
Monocytes Relative: 8 %
Neutro Abs: 2.9 10*3/uL (ref 1.7–7.7)
Neutrophils Relative %: 52 %
Platelets: 163 10*3/uL (ref 150–400)
RBC: 4.72 MIL/uL (ref 3.87–5.11)
RDW: 13.5 % (ref 11.5–15.5)
WBC: 5.7 10*3/uL (ref 4.0–10.5)
nRBC: 0 % (ref 0.0–0.2)

## 2024-04-19 NOTE — Progress Notes (Signed)
 Occupational Therapy Weekly Progress Note  Patient Details  Name: Theresa Watkins MRN: [id number] Date of Birth: Feb 21, 1966  Beginning of progress report period: Apr 09, 2024 End of progress report period: April 19, 2024  Patient has met 0 of 2 short term goals.  Pt progress has been inconsistent over the past week. Pt continues to initiate LUE use during funcitonal tasks with mod A. Pt continues to required min verbal cues for hemi dressing techniques and mod verbal cues for safety awareness, especially during transfers. Pt requires min A for toileting tasks. Pt therapy schedule modified to Q D.   Patient continues to demonstrate the following deficits: muscle weakness and muscle joint tightness, decreased cardiorespiratoy endurance, impaired timing and sequencing, abnormal tone, unbalanced muscle activation, and decreased coordination, decreased attention, decreased awareness, decreased problem solving, decreased safety awareness, decreased memory, and delayed processing, and decreased standing balance, decreased postural control, hemiplegia, and decreased balance strategies and motor planning and therefore will continue to benefit from skilled OT intervention to enhance overall performance with BADL and Reduce care partner burden.  Patient progressing toward long term goals..  Continue plan of care.  OT Short Term Goals Week 9: OT Short Term Goal 1 (Week 9): Pt will perform toileting tasks with CGA OT Short Term Goal 1 - Progress (Week 9): Progressing toward goal OT Short Term Goal 2 (Week 9): Pt will use LUE as gross assist with min A during functional tasks OT Short Term Goal 2 - Progress (Week 9): Progressing toward goal Week 10:OT Short Term Goal 1 (Week 10): Pt will perform toileting tasks with CGA OT Short Term Goal 2 - Progress (Week 10): Pt will use LUE as gross assist with min A during functional tasks   [name] 04/19/2024, 7:25 AM

## 2024-04-19 NOTE — Progress Notes (Signed)
 Physical Therapy Weekly Progress Note  Patient Details  Name: Theresa Watkins MRN: [id number] Date of Birth: November 29, 1965  Beginning of progress report period: Apr 12, 2024 End of progress report period: April 19, 2024  Today's Date: 04/19/2024 PT Individual Time: 0920-0930 PT Individual Time Calculation (min): 10 min   Patient has met 2 of 2 short term goals.  Ms. Sawtell is making slow but steady progress toward LTGs. She is progressing in independence with gait using RW vs hemi walker and performing advanced transfers like floor transfers without assist. Plan to d/c SNF as placement becomes available.   Patient continues to demonstrate the following deficits muscle weakness, abnormal tone, decreased coordination, and decreased motor planning, and decreased standing balance, hemiplegia, and decreased balance strategies and therefore will continue to benefit from skilled PT intervention to increase functional independence with mobility.  Patient progressing toward long term goals..  Continue plan of care.  PT Short Term Goals Week 9: PT Short Term Goal 1 (Week 9): Pt will ambulate with RW and min a >100 ft PT Short Term Goal 2 (Week 9): Pt will navigate stairs with min a  Skilled Therapeutic Interventions/Progress Updates:  Ambulation/gait training;Community reintegration;DME/adaptive equipment instruction;Neuromuscular re-education;Stair training;UE/LE Strength taining/ROM;Wheelchair propulsion/positioning;Balance/vestibular training;Discharge planning;Functional electrical stimulation;Pain management;Therapeutic Activities;UE/LE Coordination activities;Cognitive remediation/compensation;Disease management/prevention;Functional mobility training;Patient/family education;Splinting/orthotics;Therapeutic Exercise;Visual/perceptual remediation/compensation;Psychosocial support   Therapy Documentation Precautions:  Precautions Precautions: Fall Precaution/Restrictions Comments: L  hemi Restrictions Weight Bearing Restrictions Per Provider Order: Yes RUE Weight Bearing Per Provider Order: Partial weight bearing General: PT Amount of Missed Time (min): 10 Minutes PT Missed Treatment Reason: Patient unwilling to participate    Therapy/Group: Individual Therapy  Tex Filbert 04/19/2024, 1:52 PM

## 2024-04-19 NOTE — Plan of Care (Signed)
  Problem: Consults Goal: RH STROKE PATIENT EDUCATION Description: See Patient Education module for education specifics  Outcome: Progressing   Problem: RH SAFETY Goal: RH STG ADHERE TO SAFETY PRECAUTIONS W/ASSISTANCE/DEVICE Description: STG Adhere to Safety Precautions With  cues Assistance/Device. Outcome: Progressing   

## 2024-04-19 NOTE — Progress Notes (Signed)
 Physical Therapy Session Note  Patient Details  Name: Theresa Watkins MRN: [id number] Date of Birth: 1966/04/05  Today's Date: 04/19/2024 PT Individual Time: 0920-0930 PT Individual Time Calculation (min): 10 min  and Today's Date: 04/19/2024 PT Missed Time: 10 Minutes Missed Time Reason: Patient unwilling to participate  Short Term Goals: Week 9: PT Short Term Goal 1 (Week 9): Pt will ambulate with RW and min a >100 ft PT Short Term Goal 2 (Week 9): Pt will navigate stairs with min a  Skilled Therapeutic Interventions/Progress Updates:  pt received in bed and agreeable to therapy, however refused OOB mobility d/t therapist late arrival. Pt also unwilling to work past scheduled time to make up time, so missed x 10 min of scheduled therapy.   No complaint of pain during session aside from chronic wrist pain requiring no intervention. Pt participated in bridging for hip strength to improve standing posture and gait mechanics and LE press with YTB to promote neuromuscular return through heavy work. 4 x 10 of each exercise. Also provided education on continued finger extension practice. Pt remained in bed, was left with all needs in reach and alarm active.     Therapy Documentation Precautions:  Precautions Precautions: Fall Precaution/Restrictions Comments: L hemi Restrictions Weight Bearing Restrictions Per Provider Order: Yes RUE Weight Bearing Per Provider Order: Partial weight bearing General:       Therapy/Group: Individual Therapy  Tex Filbert 04/19/2024, 11:46 AM

## 2024-04-19 NOTE — Progress Notes (Signed)
 PROGRESS NOTE   Subjective/Complaints: No new complaints this morning Patient's chart reviewed- No issues reported overnight Vitals signs stable   ROS:  Denies SOB, abd pain, CP, N/V/C/D, and vision changes Objective:   No results found.       Recent Labs    04/19/24 0505  WBC 5.7  HGB 13.4  HCT 42.2  PLT 163            No results for input(s): "NA", "K", "CL", "CO2", "GLUCOSE", "BUN", "CREATININE", "CALCIUM " in the last 72 hours.          Intake/Output Summary (Last 24 hours) at 04/19/2024 1230 Last data filed at 04/19/2024 0906 Gross per 24 hour  Intake 594 ml  Output --  Net 594 ml            Physical Exam: Vital Signs Blood pressure 104/66, pulse 95, temperature 98.1 F (36.7 C), temperature source Oral, resp. rate 18, height 5\' 6"  (1.676 m), weight 72.3 kg, SpO2 91%.  General: awake, alert, appropriate, laying flat in bed;  NAD HENT: conjugate gaze; oropharynx moist CV: regular rate and rhythm; no JVD Pulmonary: CTA B/L; no W/R/R- good air movement GI: soft, NT, ND, (+)BS Psychiatric: Calm, appropriate mood and affect Neurological: Ox3- poor safety awareness Moderate dysarthria  MSK- L hand less swelling- looks even better today- in WHO; - tips of fingers wont' fully extend- tiny bit of flexion at tips MAS of 1- 1+ in L wrist and hand/fingers MSK-L hand Less TTP- less swelling- not wearing glove today Neuro: cognitively at baseline. Alert. Sl dysarthria. CN without changes. Able to count fingers. MAS of 0-1 in LUE Extremities: L hand - less swelling and using to help feed herself LUE 0/5, LLE 3/5, 5/5 on right side Skin-no apparent lesions  Physical exam unchanged from the above on reexamination 04/19/24    Assessment/Plan: 1. Functional deficits which require 3+ hours per day of interdisciplinary therapy in a comprehensive inpatient rehab setting. Physiatrist is providing  close team supervision and 24 hour management of active medical problems listed below. Physiatrist and rehab team continue to assess barriers to discharge/monitor patient progress toward functional and medical goals  Care Tool:  Bathing    Body parts bathed by patient: Left arm, Chest, Abdomen, Front perineal area, Buttocks, Right upper leg, Left upper leg, Right lower leg, Left lower leg, Face   Body parts bathed by helper: Right arm Body parts n/a: Right arm   Bathing assist Assist Level: Minimal Assistance - Patient > 75%     Upper Body Dressing/Undressing Upper body dressing   What is the patient wearing?: Pull over shirt    Upper body assist Assist Level: Supervision/Verbal cueing    Lower Body Dressing/Undressing Lower body dressing      What is the patient wearing?: Pants, Underwear/pull up     Lower body assist Assist for lower body dressing: Minimal Assistance - Patient > 75%     Toileting Toileting    Toileting assist Assist for toileting: Minimal Assistance - Patient > 75%     Transfers Chair/bed transfer  Transfers assist     Chair/bed transfer assist level: Contact Guard/Touching assist  Locomotion Ambulation   Ambulation assist      Assist level: Minimal Assistance - Patient > 75% Assistive device: Walker-rolling Max distance: 170   Walk 10 feet activity   Assist     Assist level: Minimal Assistance - Patient > 75% Assistive device: Walker-hemi   Walk 50 feet activity   Assist Walk 50 feet with 2 turns activity did not occur: Safety/medical concerns (L hemiplegia and pt fatigue)  Assist level: Minimal Assistance - Patient > 75% Assistive device: Walker-hemi    Walk 150 feet activity   Assist Walk 150 feet activity did not occur: Safety/medical concerns (L hemiplegia and pt fatigue)  Assist level: Minimal Assistance - Patient > 75% Assistive device: Walker-hemi    Walk 10 feet on uneven surface  activity   Assist  Walk 10 feet on uneven surfaces activity did not occur: Safety/medical concerns (L hemiplegia and pt fatigue)         Wheelchair     Assist Is the patient using a wheelchair?: Yes Type of Wheelchair: Manual    Wheelchair assist level: Supervision/Verbal cueing Max wheelchair distance: 150    Wheelchair 50 feet with 2 turns activity    Assist        Assist Level: Supervision/Verbal cueing   Wheelchair 150 feet activity     Assist      Assist Level: Supervision/Verbal cueing   Blood pressure 104/66, pulse 95, temperature 98.1 F (36.7 C), temperature source Oral, resp. rate 18, height 5\' 6"  (1.676 m), weight 72.3 kg, SpO2 91%.   Medical Problem List and Plan: 1. 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 Search for facility ongoing. May only be able to go to custodial care facility  D/c SNF-  continue CIR- PT, OT and SLP 1x/day Pt wants to get rif otelesitter placed yesterday- cannot do that right now- found on floor yesterday - she reports didn't fall, but was "looking for shoe" Will con't telesitter for 1 week and re-eval then    2. DVT/anticoagulation:  Pharmaceutical: Lovenox              -antiplatelet therapy: continue Aspirin  81 mg daily and Plavix  75 mg daily x 3 weeks then Plavix  alone  3. Pain Management: Tylenol  as needed  3/25- denies pain- con't regimen rpn   3-30: Ongoing left hip pain; Voltaren  gel and Tylenol .  4/3- having new burning pain in LUE- which is seen sometimes with stroke- will add Duloxetine  30 mg daily for nerve pain- monitor for side effects.  -if gets nausea, will change to Lyrica  4/4-6 no side effects so far-  4/8- will increase Duloxetine  to 60 mg daily  DC  hydrocodone  due to side effects, trial tyl #3  as needed for pain that is not relieved by Tylenol . 4/28- advised pt to use voltaren  gel  on all sides of L hand including between fingers- use tylenol  #3 as needed 4/29- pt admits doesn't like taking/using meds all the time- explained I don't have a "fix"- to make it better- have to take meds to have them work 4/30- encouraged again to use voltaren  4x/day for L hand 5/1- pt said using voltaren  gel and  tylenol  #3 as needed- adding lidoderm  patches 2 patches at night for back pain 5/2- HA's- will restart SCHEDULED Topamax  75 mg at bedtime- not sure what occurred? Needs to get nightly 5/9- hasn't asked for pain meds this Am including tylenol , so wants to not register the complaint" yet today.  5/11- Add Lyrica  50 mg BID for nerve pain in L hand 5/13- changed to q6pm since pt feels it affected her vision-made it worse 5/14- said pain better today- not clear if due to prednisone  vs Lyrica ? 5/15 pain much better in L hand-  pt feels it's gout? It might be 5/16- hand pain slightly better with ACE wrap overnight- which is fine 5/20- pt reports having migraines every AM, but refusing topamax  last few days- at least 3- will go back and d/w pt and NOT make changes as we discussed 5/21- will reduce Topamax  to 25 mg at bedtime to help with feelinga little confused 5/22- Will stop Topamax  since made her so "loopy"- pt doesn't want any other meds to try to help HA's- said will take Tylenol  5/26- Still having migraines intermittently 5/27- L hand and wrist pain- radiates to L shoulder- no deformity- but holds hand in vulcan gesture at rest- xrays pending 5/29- 5/30- hand swelling and pain almost back to baseline 5-31: Last use of as needed Tylenol  with codeine  5-29, patient wishes to keep on board.  Defer to primary team  4. Depression/Behavior/Sleep: Provide emotional support  4/21 mood improved--continue duloxetine              -antipsychotic agents: N/A 5.  Neuropsych/cognition: This patient is capable of making decisions on her own behalf. 6. Skin/Wound Care: Routine skin checks 7. Fluids/Electrolytes/Nutrition:encouraging po.   5/12- I personally reviewed the patient's labs today.     -encouraging PO 8.  Dysphagia.  Dysphagia 3 with thins currently  4/18 intake ok 5/11-5/13- advised pt to take smaller bites-  intake good, but takes too big of bites    5/14- switched back to full supervision- pt unhappy, but wasn't listening when I discussed more of how to eat/take bites 5/16- taking smaller bites with full supervision 5/18 eating 65 to 95% of her meals today 5/22- Back to full supervision- has to sit completely upright to eat 5/23- if this goes better, can go back to intermittent supervision once SLP decided 5-31: P.o. intakes appear adequate.  9.  Hyperlipidemia.  Continue Crestor  10.  Recent History of cocaine/tobacco use.  UDS positive for cocaine.  Provide counseling, this has been a chronic issue , seen in PCP notes 11.  Hypothyroidism. continue Synthroid   12.  Constipation.   Senokot-S 2 tab twice daily  5/6- LBM yesterday even though chart says 5/3  5/7- will add Miralax - esp since increased Duloxetine  which can cause constipation- already on Senna 2 tabs BID  5/9- LBM documented as yesterday- medium BM  5.10- LBM 2 days ago- thinks can go today  LBM 5/12  5/15- LBM yesterday  5/17 Increase miralax  to BID, add sorbitol  PRN  5/18 appears she has been refusing miralax , will check abd xray, suspect constipation. sorbitol   5/19- up all night having Bms, but cleaned out  5/20- LBM yesterday  5/22- will d/c Miralax - not taking- and con't Senna  5/23- trying ot have BM on toilet- pt might need intervention if doesn't go by tomorrow  5-31: Last bowel movement, medium  15. Blurry vision and dysconjugate gaze  3/27- will need Ophtho after d/c.   4/4- pt reports everything  blurry- cannot see faces as a result- never had eye checked in  past- used readers but cannot see at a distance since stroke- will send to Ophtho after d/c.   4/7- d/w pt again- she wants eyeglasses- explained we cannot get when in hospital- also per guidelines, since vision can still change, to not get for 3 months after stroke. She disbelieves it's from stroke, since had 2 prior- that didn't cause vision changes- we educated pt pn how this can occur.   4/25- reminded pt cannot get Optho in hospital- need to get once d/c'd  4/30 went over visual changes with pt- will need to get glasses, likely prisms 2-3 months after d/c once vision stabilizes  5/6- d/w pt again- she wants to see Optho- explained they do not come into hospital for this  5/18 continued blurry vision, discussed outpatient f/u  5/23- pt having blurry vision again this AM, she says slightly worse- will con't to monitor  5-31: No complaints of blurry vision  16.  Left hip pain.  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 Severe HA-daily HA's  4/15-16 sx appear improved with topamax , tylenol  helps also   -may use sx to get out of therapy at times?  4/23- Per team, pt having HA's frequently which impairs therapy- she won't discuss HA's with me- unclear if only has a day goes on vs self limting behavior  4/24- increase to 75 mg at bedtime  4/25- still having HA's, actually thinks a little worse since not taking Norco for it-   4/28-4/29 fewer complaints about HA this AM  4/30-5/1- - per staff, c/o these less- not interfering with therapy as much  5/2- Wasn't getting topamax - just taking  T#3- wil restart Topamax - don't know what occurred with order  5/6- HA's doing better  5/8- not requiring T#3 anymore lately per pt  5/13- Severe HA 5/11 in afternoon- took T#3  5/17 headache today does not sound severe, she has not taken her Tylenol  3 yet but nursing is getting it for her  5/18 headache improved with Tylenol  No. 3 today  5/20- refusing topamax - for past 3+ days- will go back and d/w pt  18. Decreased safety awareness/impulsiveness  4/30- still requiring max cues  5/7- 5/10- no change-per therapy- still requiring max cues  5/23- Still requiring mod-max cues for safety  20. Spasticity  4/22- off baclofen , and c/o LUE pain- wondering how much of her complaints are due to tone?-   4/30- Spasticity improved in LUE- MAS of 0 actually this AM  5/11-12- Getting some spasticity vs disuse tightness inL hand- fingers esp 3rd/4th DIPs- might need Botox after d/c  5/14- MAS of 1 to 1+ in L wrist and fingers- not proximally  5/27- Spasticity slightly worse this AM- esp when yawns- explained that's normal with spasticity due to increased pain  21. 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 Aaron Aas  4/30- pt pulled scab off-  22.  Urinary  and bowel incont due to CVA, continue toileting program 4/23- still cannot control B/B 4/30- on toileting program- is getting somewhat better, but still incontinent more at night  5/13- still incontinent at night  5/18 continued incontinence   23. 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   4/29- will get Dopplers, because pt says she elevated hand overnight- still  swollen- ordered  4/30- Dopplers (-)- will get isotoner compression glove  5/4 received glove  5/6- Swelling ~ 50% better- will increase Duloxetine  to 120 mg daily for nerve pain in L hand and mood.   5/7- hand sore this AM- but not "killing her".   5/8- pain great this AM- doesn't need meds other than tylenol  per pt -sounds like increased cymbalta  has helped  5/9- Some pain in L hand this Am, but hasn't taken tylenol  or T#3 yet this Am- 5/13- Will decrease Lyrica  to 50 mg q6pm since caused her to have visual changes early in day  5/14- Pain doing better in last 12+ hours- not as painful  24. C/O catch in breath- no SOB  5/9- asked pt to use ICS every 1 hour while awake- since probably is atelectasis   25. Dry eyes continue  eye drops as needed for pt  26. Gout? Swelling of L hand  5/13- pt has never had gout but runs in her family- she wants to try something else ot see if would help L hand- will try Colchicine  0.6 mg daily x 4 days as well as Prednisone  20 mg daily x 4 days-  5/14- pt feels pain and swelling are better- not sure if due to gout, or just prednisone  vs power of suggestion- will con't to monitor  5/15- pt feels swelling and pain is better- maybe it is gout? Vs overall improvement with prednisone   27. Vaginal candidiasis  5/15- pt reports Sx's- will give Diflucan  150 mg x1   5/22- no more complaints  28. Worsening urinary incontinence/frequency at night  Discussed that UA and UC are negative  - 5-31, 6/1: Mostly continent except at nighttime.  Monitor.  LOS: 70 days A FACE TO FACE EVALUATION WAS PERFORMED  Nikol Lemar P Latoyna Hird 04/19/2024, 12:30 PM

## 2024-04-19 NOTE — Progress Notes (Signed)
 Occupational Therapy Session Note  Patient Details  Name: Theresa Watkins MRN: [id number] Date of Birth: [birth year]  Today's Date: 04/19/2024 OT Individual Time: 4098-1191 OT Individual Time Calculation (min): 28 min    Short Term Goals: Week 9: OT Short Term Goal 1 (Week 10): Pt will perform toileting tasks with CGA OT Short Term Goal 2 (Week 10): Pt will use LUE as gross assist with min A during functional tasks  Skilled Therapeutic Interventions/Progress Updates:    Pt resting in bed upon arrival. Pt declined OOB activity this morning but agreeable to bed level LUE theract and functional reaching to increase function in LUE and independence with BADLs. Supine tasks reaching out and over head with resistance. Pt with improved shoulder strength but weak grasp. Pt agreed to OOB activities during tomorrow's session. Pt remained in bed with all needs within reach. Bed alarm activated.   Therapy Documentation Precautions:  Precautions Precautions: Fall Precaution/Restrictions Comments: L hemi Restrictions Weight Bearing Restrictions Per Provider Order: Yes RUE Weight Bearing Per Provider Order: Partial weight bearing    Pain: Pt reports LUE/wrist discomfort with flexion; no swelling noticed; gentle ROM provided with relief noted  Therapy/Group: Individual Therapy  [name] 04/19/2024, 11:14 AM

## 2024-04-20 MED ORDER — ENOXAPARIN SODIUM 40 MG/0.4ML IJ SOSY
40.0000 mg | PREFILLED_SYRINGE | INTRAMUSCULAR | Status: DC
  Administered 2024-04-20 – 2024-04-27 (×8): 40 mg via SUBCUTANEOUS
  Filled 2024-04-20 (×8): qty 0.4

## 2024-04-20 MED ORDER — CLOPIDOGREL BISULFATE 75 MG PO TABS
75.0000 mg | ORAL_TABLET | Freq: Every day | ORAL | Status: DC
  Administered 2024-04-20 – 2024-04-27 (×8): 75 mg via ORAL
  Filled 2024-04-20 (×8): qty 1

## 2024-04-20 MED ORDER — DULOXETINE HCL 60 MG PO CPEP
120.0000 mg | ORAL_CAPSULE | Freq: Every day | ORAL | Status: DC
  Administered 2024-04-20 – 2024-04-27 (×6): 120 mg via ORAL
  Filled 2024-04-20 (×7): qty 2

## 2024-04-20 NOTE — Progress Notes (Signed)
 Speech Language Pathology Daily Session Note  Patient Details  Name: [name] MRN: [id number] Date of Birth: 11/08/1966  Today's Date: 04/20/2024 SLP Individual Time: 1400-1432 SLP Individual Time Calculation (min): 32 min  Short Term Goals: Week 8: SLP Short Term Goal 1 (Week 8): (Week 10) Patient will communicate complex information with 90% intelligibility and mod iA SLP Short Term Goal 1 - Progress (Week 8): Met SLP Short Term Goal 2 (Week 8): (Week 10) Patient will utilize safe swallowing strategies during consumption of regular/thin diet with mod i multimodal A SLP Short Term Goal 2 - Progress (Week 8): Met  Skilled Therapeutic Interventions:  Pt was seen in PM to address dysphagia management and speech intelligibility. Pt was alert and seen at bedside upon SLP arrival. She was agreeable for session. Pt recalled swallowing compensatory strategies with supervision. In other minutes of session SLP addressed speech intelligibility through informal conversational exchange. Pt warranting min A cues for pausing with speech intelligibility ranging from 80-100% intelligibility. At conclusion of session, pt was left upright in bed with call button within reach and bed alarm active. SLP to continue POC.   Pain Pain Assessment Pain Scale: 0-10 Pain Score: 0-No pain  Therapy/Group: Individual Therapy  Adela Holter 04/20/2024, 3:59 PM

## 2024-04-20 NOTE — Progress Notes (Signed)
 Patient ID: Theresa Watkins, female   DOB: Jun 09, 1966, 58 y.o.   MRN: [id number]  SW met with pt in room to inform no new updates on placement at this time. SW will confirm when there are more updates.   Norval Been, MSW, LCSW Office: [phone] Cell: [phone] Fax: [phone]

## 2024-04-20 NOTE — Progress Notes (Signed)
 Physical Therapy Session Note  Patient Details  Name: Theresa Watkins MRN: [id number] Date of Birth: 05-30-1966  Today's Date: 04/20/2024 PT Missed Time: 30 Minutes Missed Time Reason: Patient unwilling to participate  Short Term Goals: Week 9: PT Short Term Goal 1 (Week 9): Pt will ambulate with RW and min a >100 ft PT Short Term Goal 2 (Week 9): Pt will navigate stairs with min a  Skilled Therapeutic Interventions/Progress Updates:    Pt recd in the shower in care of NT.  Pt refused therapy at this time saying she has been unwell all day. Pt missed x 30 min of scheduled therapy, will make up as able.   Therapy Documentation Precautions:  Precautions Precautions: Fall Precaution/Restrictions Comments: L hemi Restrictions Weight Bearing Restrictions Per Provider Order: No RUE Weight Bearing Per Provider Order: Partial weight bearing General: PT Amount of Missed Time (min): 30 Minutes PT Missed Treatment Reason: Patient unwilling to participate    Therapy/Group: Individual Therapy  Tex Filbert 04/20/2024, 1:15 PM

## 2024-04-20 NOTE — Progress Notes (Signed)
 Occupational Therapy Note  Patient Details  Name: Theresa Watkins MRN: [id number] Date of Birth: [birth year]  Today's Date: 04/20/2024 OT Missed Time: 30 Minutes Missed Time Reason: Other (comment) (pt c/o n/v)  Pt sleeping upon arrival but easily aroused. Pt reports she "threw up" earlier and still has "upset stomach." Pt politely declined therapy this morning. Will attempt to see pt again as schedule allows.    Andre Kawasaki [facility] 04/20/2024, 9:48 AM

## 2024-04-20 NOTE — Progress Notes (Signed)
 On can PA Jean Michaelis called to discuss issue with patients medications.  Pt did not receive any medications today.  New orders received to give medications (Lovenox , Plavix  or Cymbalta ) now and to continue Daily dose at this time moving forward.  Called and spoke to Pharmacist, Earle, to adjust medications and Myron Asters, the pts nurse aware and will be proceed to medicate pt at his time.  Pt resting in bed comfortably at the lowest position with side rails up and call light in reach.

## 2024-04-21 DIAGNOSIS — I63511 Cerebral infarction due to unspecified occlusion or stenosis of right middle cerebral artery: Secondary | ICD-10-CM | POA: Diagnosis not present

## 2024-04-21 NOTE — Patient Care Conference (Signed)
 Inpatient RehabilitationTeam Conference and Plan of Care Update Date: 04/20/2024   Time: 1107 am    Patient Name: Theresa Watkins      Medical Record Number: [id number]  Date of Birth: 1965/12/07 Sex: Female         Room/Bed: 4W01C/4W01C-01 Payor Info: Payor: [place] MEDICAID PREPAID HEALTH PLAN / Plan: [place] MEDICAID HEALTHY BLUE / Product Type: *No Product type* /    Admit Date/Time:  02/09/2024  6:29 PM  Primary Diagnosis:  Right middle cerebral artery stroke [facility])  Hospital Problems: Principal Problem:   Right middle cerebral artery stroke (HCC) Active Problems:   Polysubstance abuse [facility])    Expected Discharge Date: Expected Discharge Date:  (pending)  Team Members Present: Physician leading conference: Dr. Abelino Able Social Worker Present: Norval Been, LCSW Nurse Present: Jerene Monks, RN PT Present: Aundria Leech, PT OT Present: [name], OT;Jackqueline Mason, COTA SLP Present: Jenney Modest, SLP     Current Status/Progress Goal Weekly Team Focus  Bowel/Bladder   Cont of B/B with some urgency, wearing  brief for protection   Maintain normal b/b function   Toilet q4 hrs and prn    Swallow/Nutrition/ Hydration   regular/ thin, recommending full sup for use of comepnsatory strategies due to inconsistent upright positioning and use of swallow precautions   mod I  carryover of compensatory strategies.    ADL's   bathing-CGA; LB dressing-mod A for safety, UB dressing-min A; transfers-CGA/min A; impulsive, initiating LUE use in funcitonal tasks, participation off/on   min A overall; LB dressing downgraded to mod A; dynamic standing balance downgraded to min A; anticipatory awareness downgraded to mod A   safety awareness, transfers, LUE NMR    Mobility   Supervision bed mobility, near supervision some transfers, otherwise min-CGA for safety. limited by safety awareness and impulsivity, gait with hemi walker 170 ft, introduced RW with good success   supervision  bed<>chair, min a short distance gait, mod i w/c  gait, transfers, safety awareness    Communication   fluctuating speech intelligibility with positioning, pain and fatigue ~75-90% intelligible at the conversational level   mod I   carryover of compensatory strategies    Safety/Cognition/ Behavioral Observations               Pain   complains of pain to LUE and HA. Relieved by tylenol  and lydocaine patch   pts pain will be controlled <4/10   Adress pain q shift and prn    Skin   skin is intact   skin will remain intact  encourage mobility, q shift skin assessment and prn      Discharge Planning:  Current barriers remains SNF with bed offer and insurance approval pending. [place]DSS Placement SW is working on assisting with finding SNF as well. SW will confirm there are no barriers to discharge.    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  Patient limited by left upper extremity  pain, headaches,impulsivity, decreased  safety awareness,  mixed dysarthria and self limiting behaviors.   Patient on target to meet rehab goals: Yes, currently patient requires CGA with upper body care and mod assist with lower body care. Patient requires supervision on some transfers and able to ambulate 170' min-CGA using hemi walker. Introduced rolling walker with good success.  Overall goals at discharge are set for min assistance.  *See Care Plan and progress notes for long and short-term goals.  Revisions to Treatment Plan:  Hemi dressing techniques  Blanca Bunch Hemi walker Rolling walker IOPI EMST device Full supervision with meals  Teaching Needs: Safety, medications, transfers, toileting , dietary modifications,etc    Current Barriers to Discharge: Decreased caregiver support and Incontinence  Possible Resolutions to Barriers: Family Education SNF     Medical Summary Current Status: R  MCA CVA, pain, hypothyroidism, constipation, HLD, cocaine and tobacco abuse, ETOH abuse, vitamin D  deficiency  Barriers to Discharge: Medical stability  Barriers to Discharge Comments: R MCA CVA, pain, hypothyroidism, constipation, HLD, cocaine and tobacco abuse, ETOH abuse, vitamin D  deficiency Possible Resolutions to [employer]: continue crestor , Plavix , continue cymbalta , continue synthroid , continue lyrica , continue senna-docusate, continue tylenol  #3, provide substance abuse education, continue D3 supplement   Continued Need for Acute Rehabilitation Level of Care: The patient requires daily medical management by a physician with specialized training in physical medicine and rehabilitation for the following reasons: Direction of a multidisciplinary physical rehabilitation program to maximize functional independence : Yes Medical management of patient stability for increased activity during participation in an intensive rehabilitation regime.: Yes Analysis of laboratory values and/or radiology reports with any subsequent need for medication adjustment and/or medical intervention. : Yes   I attest that I was present, lead the team conference, and concur with the assessment and plan of the team.   Ana Liaw Gayo 04/20/2024, 1107 am

## 2024-04-21 NOTE — Plan of Care (Signed)
  Problem: Consults Goal: RH STROKE PATIENT EDUCATION Description: See Patient Education module for education specifics  Outcome: Progressing   Problem: RH BOWEL ELIMINATION Goal: RH STG MANAGE BOWEL WITH ASSISTANCE Description: STG Manage Bowel with toileting Assistance. Outcome: Progressing Goal: RH STG MANAGE BOWEL W/MEDICATION W/ASSISTANCE Description: STG Manage Bowel with Medication with mod I Assistance. Outcome: Progressing   Problem: RH SAFETY Goal: RH STG ADHERE TO SAFETY PRECAUTIONS W/ASSISTANCE/DEVICE Description: STG Adhere to Safety Precautions With cues Assistance/Device. Outcome: Progressing   Problem: RH PAIN MANAGEMENT Goal: RH STG PAIN MANAGED AT OR BELOW PT'S PAIN GOAL Description: Pain managed < 4 with prns Outcome: Progressing   Problem: Consults Goal: RH GENERAL PATIENT EDUCATION Description: See Patient Education module for education specifics. Outcome: Progressing Goal: Skin Care Protocol Initiated - if Braden Score 18 or less Description: If consults are not indicated, leave blank or document N/A Outcome: Progressing Goal: Nutrition Consult-if indicated Outcome: Progressing Goal: Diabetes Guidelines if Diabetic/Glucose > 140 Description: If diabetic or lab glucose is > 140 mg/dl - Initiate Diabetes/Hyperglycemia Guidelines & Document Interventions  Outcome: Progressing

## 2024-04-21 NOTE — Progress Notes (Addendum)
 Patient ID: Theresa Watkins, female   DOB: Feb 25, 1966, 58 y.o.   MRN: [id number]  SW spoke with [name]/liaison with [facility] reporting they can extend a bed offer. They will submit insurance auth and follow-up. States if a bed becomes available at [facility], they could likely place here pending how well she participates.   SW met with pt in room to discuss above, and left message for pt son Theresa Watkins while in room asking him to call back. SW will update once more information.   SW updated medical team on above.   *SW received phone call from pt son Theresa Watkins and provided updates and discussed options in length. SW will provide updates once available.   Norval Been, MSW, LCSW Office: [phone] Cell: [phone] Fax: [phone]

## 2024-04-21 NOTE — Plan of Care (Signed)
  Problem: RH SAFETY Goal: RH STG ADHERE TO SAFETY PRECAUTIONS W/ASSISTANCE/DEVICE Description: STG Adhere to Safety Precautions With cues Assistance/Device. Outcome: Progressing   Problem: RH PAIN MANAGEMENT Goal: RH STG PAIN MANAGED AT OR BELOW PT'S PAIN GOAL Description: Pain managed < 4 with prns Outcome: Progressing   Problem: RH SKIN INTEGRITY Goal: RH STG SKIN FREE OF INFECTION/BREAKDOWN Outcome: Progressing Goal: RH STG MAINTAIN SKIN INTEGRITY WITH ASSISTANCE Description: STG Maintain Skin Integrity With Assistance. Outcome: Progressing Goal: RH STG ABLE TO PERFORM INCISION/WOUND CARE W/ASSISTANCE Description: STG Able To Perform Incision/Wound Care With Assistance. Outcome: Progressing   Problem: RH PAIN MANAGEMENT Goal: RH STG PAIN MANAGED AT OR BELOW PT'S PAIN GOAL Outcome: Progressing

## 2024-04-21 NOTE — Progress Notes (Signed)
 Physical Therapy Session Note  Patient Details  Name: Theresa Watkins MRN: [id number] Date of Birth: [birth year]  Today's Date: 04/21/2024 PT Individual Time: 1035-1100 PT Individual Time Calculation (min): 25 min   Short Term Goals: Week 9: PT Short Term Goal 1 (Week 9): Pt will ambulate with RW and min a >100 ft PT Short Term Goal 2 (Week 9): Pt will navigate stairs with min a  Skilled Therapeutic Interventions/Progress Updates:    Pt presents in room in bed, agreeable to PT. Pt denies pain at this time. Session focused on transfer training and gait training with RW. Pt completes bed mobility with min assist for trunk to upright. Therapist dons shoes total assist for time management, dons pants with mod assist with pt standing to pull pants over hips with mod assist. Pt completes stand step transfer without device with min assist. Pt transported to day room. Pt ambulates 180' with RW with min assist with cues for step length and to maintain attention to task with pt demonstrating decreased stance time LLE and poor foot clearance bilaterally with fatigue. Pt returned to sitting in WC, returned back to room dependently for time management and handoff to NT in room.  Therapy Documentation Precautions:  Precautions Precautions: Fall Precaution/Restrictions Comments: L hemi Restrictions Weight Bearing Restrictions Per Provider Order: No RUE Weight Bearing Per Provider Order: Partial weight bearing    Therapy/Group: Individual Therapy  Annia Kilts PT, DPT 04/21/2024, 12:39 PM

## 2024-04-21 NOTE — Progress Notes (Signed)
 Speech Language Pathology Daily Session Note  Patient Details  Name: Theresa Watkins MRN: [id number] Date of Birth: 1966-02-17  Today's Date: 04/21/2024 SLP Individual Time: 4098-1191 SLP Individual Time Calculation (min): 47 min  Short Term Goals: Week 8: SLP Short Term Goal 1 (Week 8): (Week 10) Patient will communicate complex information with 90% intelligibility and mod iA SLP Short Term Goal 1 - Progress (Week 8): Met SLP Short Term Goal 2 (Week 8): (Week 10) Patient will utilize safe swallowing strategies during consumption of regular/thin diet with mod i multimodal A SLP Short Term Goal 2 - Progress (Week 8): Met  Skilled Therapeutic Interventions:  Patient was seen in am to address dysphagia management and speech intelligibility. Pt alert and seated upright on EOB upon SLP arrival. Assigned nurse present and administering medication. Pt consumed pills whole with sips of water via straw. Medication administration unremarkable. Pt was assisted to bathroom by nurse where she was continent of bladder. SLP assisted pt from toilet to sink via WC. She completed oral hygiene indep. Pt agreeable to stay in [facility] for breakfast meal. SLP providing set up A for opening packages and cutting food into small pieces. Pt continues to adminster large boluse sizes however, receptive this date to intermittent cues. Also noted consistent prolonged mastication though suspect 2/2 oral deficits and inadequate dentition vs bolus size. Pt without s/s aspiration throughout meal. Pt's speech appeared less intelligible this date however pt reports just waking prior to SLP arrival. Pt receptive to cues for over articulation in order to repair communication breakdown. Speech intelligibly ranging from 70-80% in phrases and sentences this am. Pt left upright in WC. SLP to continue POC.   Pain Pain Assessment Pain Scale: 0-10 Pain Score: 0-No pain  Therapy/Group: Individual Therapy  Adela Holter 04/21/2024, 8:24 AM

## 2024-04-21 NOTE — Progress Notes (Signed)
 Occupational Therapy Session Note  Patient Details  Name: PREEYA CLECKLEY MRN: [id number] Date of Birth: 26-Aug-1966  Today's Date: 04/21/2024 OT Individual Time: 0930-1000 OT Individual Time Calculation (min): 30 min    Short Term Goals: Week 10: OT Short Term Goal 1 (Week 10): Pt will perform toileting tasks with CGA OT Short Term Goal 2 (Week 10): Pt will use LUE as gross assist with min A during functional tasks  Skilled Therapeutic Interventions/Progress Updates:    Pt resting in bed upon arrival and agreeable to sitting EOB. Pt provided with cup of coffee and [place] (per pt request.) Pt initiated use of LUE to assist with opening pack of [place] but required assistance to complete task. Sitting balance EOB with supervision. Pt initiated use of LUE to assist with disposing of trash when finished. LUE reaching tasks to grasp (weak) objects on bedside table. Pt returned to supine with supervison. All needs within reach. Bed alarm activated with Telesitter in room.   Therapy Documentation Precautions:  Precautions Precautions: Fall Precaution/Restrictions Comments: L hemi Restrictions Weight Bearing Restrictions Per Provider Order: No RUE Weight Bearing Per Provider Order: Partial weight bearing Pain: Pain Assessment Pain Scale: 0-10 Pain Score: 0-No pain   Therapy/Group: Individual Therapy  [name] 04/21/2024, 11:33 AM

## 2024-04-21 NOTE — Progress Notes (Signed)
 Patient ID: Theresa Watkins, female   DOB: October 29, 1966, 58 y.o.   MRN: [id number]  SW waiting on updates from [place] [facility]- placement SW Chastiny if any new updates.   Norval Been, MSW, LCSW Office: [phone] Cell: [phone] Fax: [phone]

## 2024-04-21 NOTE — Progress Notes (Signed)
 PROGRESS NOTE   No new complaints this morning Patient's chart reviewed- No issues reported overnight Vitals signs stable  Denies pain  ROS:  Denies SOB, abd pain, CP, N/V/C/D, and vision changes Objective:   No results found.   Recent Labs    04/19/24 0505  WBC 5.7  HGB 13.4  HCT 42.2  PLT 163    No results for input(s): "NA", "K", "CL", "CO2", "GLUCOSE", "BUN", "CREATININE", "CALCIUM " in the last 72 hours.    Intake/Output Summary (Last 24 hours) at 04/21/2024 1223 Last data filed at 04/21/2024 0800 Gross per 24 hour  Intake 720 ml  Output --  Net 720 ml      Physical Exam: Vital Signs Blood pressure 105/73, pulse 82, temperature 98.2 F (36.8 C), temperature source Oral, resp. rate 18, height 5\' 6"  (1.676 m), weight 72.3 kg, SpO2 93%.  General: awake, alert, appropriate, laying flat in bed;  NAD HENT: conjugate gaze; oropharynx moist CV: regular rate and rhythm; no JVD Pulmonary: CTA B/L; no W/R/R- good air movement GI: soft, NT, ND, (+)BS Psychiatric: Calm, appropriate mood and affect Neurological: Ox3- poor safety awareness Moderate dysarthria  MSK- L hand less swelling- looks even better today- in WHO; - tips of fingers wont' fully extend- tiny bit of flexion at tips MAS of 1- 1+ in L wrist and hand/fingers MSK-L hand Less TTP- less swelling- not wearing glove today Neuro: cognitively at baseline. Alert. Sl dysarthria. CN without changes. Able to count fingers. MAS of 0-1 in LUE Extremities: L hand - less swelling and using to help feed herself MMT: 5/5 on right side LUE 0/5, LLE 3/5 Skin-no apparent lesions  Physical exam unchanged from the above on reexamination 04/21/24    Assessment/Plan: 1. Functional deficits which require 3+ hours per day of interdisciplinary therapy in a comprehensive inpatient rehab setting. Physiatrist is providing close team supervision and 24 hour management of  active medical problems listed below. Physiatrist and rehab team continue to assess barriers to discharge/monitor patient progress toward functional and medical goals  Care Tool:  Bathing    Body parts bathed by patient: Left arm, Chest, Abdomen, Front perineal area, Buttocks, Right upper leg, Left upper leg, Right lower leg, Left lower leg, Face   Body parts bathed by helper: Right arm Body parts n/a: Right arm   Bathing assist Assist Level: Minimal Assistance - Patient > 75%     Upper Body Dressing/Undressing Upper body dressing   What is the patient wearing?: Pull over shirt    Upper body assist Assist Level: Supervision/Verbal cueing    Lower Body Dressing/Undressing Lower body dressing      What is the patient wearing?: Pants, Underwear/pull up     Lower body assist Assist for lower body dressing: Minimal Assistance - Patient > 75%     Toileting Toileting    Toileting assist Assist for toileting: Minimal Assistance - Patient > 75%     Transfers Chair/bed transfer  Transfers assist     Chair/bed transfer assist level: Contact Guard/Touching assist     Locomotion Ambulation   Ambulation assist      Assist level: Minimal Assistance - Patient > 75% Assistive  device: Walker-rolling Max distance: 170   Walk 10 feet activity   Assist     Assist level: Minimal Assistance - Patient > 75% Assistive device: Walker-hemi   Walk 50 feet activity   Assist Walk 50 feet with 2 turns activity did not occur: Safety/medical concerns (L hemiplegia and pt fatigue)  Assist level: Minimal Assistance - Patient > 75% Assistive device: Walker-hemi    Walk 150 feet activity   Assist Walk 150 feet activity did not occur: Safety/medical concerns (L hemiplegia and pt fatigue)  Assist level: Minimal Assistance - Patient > 75% Assistive device: Walker-hemi    Walk 10 feet on uneven surface  activity   Assist Walk 10 feet on uneven surfaces activity did not  occur: Safety/medical concerns (L hemiplegia and pt fatigue)         Wheelchair     Assist Is the patient using a wheelchair?: Yes Type of Wheelchair: Manual    Wheelchair assist level: Supervision/Verbal cueing Max wheelchair distance: 150    Wheelchair 50 feet with 2 turns activity    Assist        Assist Level: Supervision/Verbal cueing   Wheelchair 150 feet activity     Assist      Assist Level: Supervision/Verbal cueing   Blood pressure 105/73, pulse 82, temperature 98.2 F (36.8 C), temperature source Oral, resp. rate 18, height 5\' 6"  (1.676 m), weight 72.3 kg, SpO2 93%.   Medical Problem List and Plan: 1. 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 Search for facility ongoing. May only be able to go to custodial care facility  D/c SNF-  Continue CIR- PT, OT and SLP 1x/day Pt wants to get rif otelesitter placed yesterday- cannot do that right now- found on floor yesterday - she reports didn't fall, but was "looking for shoe" Will con't telesitter for 1 week and re-eval then    2. DVT/anticoagulation:  Pharmaceutical: Lovenox              -antiplatelet therapy: continue Aspirin  81 mg daily and Plavix  75 mg daily x 3 weeks then Plavix  alone  3. Pain Management: continue Tylenol  as needed  3/25- denies pain- con't regimen rpn   3-30: Ongoing left hip pain; Voltaren  gel and Tylenol .  4/3- having new burning pain in LUE- which is seen sometimes with stroke- will add Duloxetine  30 mg daily for nerve pain- monitor for side effects.  -if gets nausea, will change to Lyrica  4/4-6 no side effects so far-  4/8- will increase Duloxetine  to 60 mg daily  DC hydrocodone  due to side effects, trial tyl  #3  as needed for pain that is not relieved by Tylenol . 4/28- advised pt to use voltaren  gel  on all sides of L hand including between fingers- use tylenol  #3 as needed 4/29- pt admits doesn't like taking/using meds all the time- explained I don't have a "fix"- to make it better- have to take meds to have them work 4/30- encouraged again to use voltaren  4x/day for L hand 5/1- pt said using voltaren  gel and tylenol  #3 as needed- adding lidoderm  patches 2 patches at night for back pain 5/2- HA's- will restart SCHEDULED  Topamax  75 mg at bedtime- not sure what occurred? Needs to get nightly 5/9- hasn't asked for pain meds this Am including tylenol , so wants to not register the complaint" yet today.  5/11- Add Lyrica  50 mg BID for nerve pain in L hand 5/13- changed to q6pm since pt feels it affected her vision-made it worse 5/14- said pain better today- not clear if due to prednisone  vs Lyrica ? 5/15 pain much better in L hand-  pt feels it's gout? It might be 5/16- hand pain slightly better with ACE wrap overnight- which is fine 5/20- pt reports having migraines every AM, but refusing topamax  last few days- at least 3- will go back and d/w pt and NOT make changes as we discussed 5/21- will reduce Topamax  to 25 mg at bedtime to help with feelinga little confused 5/22- Will stop Topamax  since made her so "loopy"- pt doesn't want any other meds to try to help HA's- said will take Tylenol  5/26- Still having migraines intermittently 5/27- L hand and wrist pain- radiates to L shoulder- no deformity- but holds hand in vulcan gesture at rest- xrays pending 5/29- 5/30- hand swelling and pain almost back to baseline 5-31: Last use of as needed Tylenol  with codeine  5-29, patient wishes to keep on board.  Defer to primary team  4. Depression: continue duloxetine         5. Neuropsych/cognition: This patient is capable of making decisions on her own behalf. 6. Skin/Wound Care: Routine skin checks 7.  Fluids/Electrolytes/Nutrition:encouraging po.   5/12- I personally reviewed the patient's labs today.     -encouraging PO 8.  Dysphagia.  Dysphagia 3 with thins currently  4/18 intake ok 5/11-5/13- advised pt to take smaller bites-  intake good, but takes too big of bites    5/14- switched back to full supervision- pt unhappy, but wasn't listening when I discussed more of how to eat/take bites 5/16- taking smaller bites with full supervision 5/18 eating 65 to 95% of her meals today 5/22- Back to full supervision- has to sit completely upright to eat 5/23- if this goes better, can go back to intermittent supervision once SLP decided 5-31: P.o. intakes appear adequate.  9.  Hyperlipidemia.  Continue Crestor  10.  Recent History of cocaine/tobacco use.  UDS positive for cocaine.  Provide counseling, this has been a chronic issue , seen in PCP notes 11.  Hypothyroidism. continue Synthroid   12.  Constipation.   Senokot-S 2 tab twice daily  5/6- LBM yesterday even though chart says 5/3  5/7- will add Miralax - esp since increased Duloxetine  which can cause constipation- already on Senna 2 tabs BID  5/9- LBM documented as yesterday- medium BM  5.10- LBM 2 days ago- thinks can go today  LBM 5/12  5/15- LBM yesterday  5/17 Increase miralax  to BID, add sorbitol  PRN  5/18 appears she has been refusing miralax , will check abd xray, suspect constipation. sorbitol   5/19- up all night having Bms, but cleaned out  5/20- LBM yesterday  5/22- will d/c Miralax - not taking- and con't Senna  5/23- trying ot have BM on toilet- pt might need intervention if doesn't go by tomorrow  5-31: Last bowel movement, medium  15. Blurry vision and dysconjugate gaze  3/27- will need Ophtho after d/c.   4/4- pt reports everything blurry- cannot see faces as a result- never had eye checked in past- used readers but cannot see at a distance since stroke- will send to Ophtho after d/c.   4/7- d/w  pt again- she wants  eyeglasses- explained we cannot get when in hospital- also per guidelines, since vision can still change, to not get for 3 months after stroke. She disbelieves it's from stroke, since had 2 prior- that didn't cause vision changes- we educated pt pn how this can occur.   4/25- reminded pt cannot get Optho in hospital- need to get once d/c'd  4/30 went over visual changes with pt- will need to get glasses, likely prisms 2-3 months after d/c once vision stabilizes  5/6- d/w pt again- she wants to see Optho- explained they do not come into hospital for this  5/18 continued blurry vision, discussed outpatient f/u  5/23- pt having blurry vision again this AM, she says slightly worse- will con't to monitor  5-31: No complaints of blurry vision  16.  Left hip pain.  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 Severe HA-daily HA's  4/15-16 sx appear improved with topamax , tylenol  helps also   -may use sx to get out of therapy at times?  4/23- Per team, pt having HA's frequently which impairs therapy- she won't discuss HA's with me- unclear if only has a day goes on vs self limting behavior  4/24- increase to 75 mg at bedtime  4/25- still having HA's, actually thinks a little worse since not taking Norco for it-   4/28-4/29 fewer complaints about HA this AM  4/30-5/1- - per staff, c/o these less- not interfering with therapy as much  5/2- Wasn't getting topamax - just taking T#3- wil restart Topamax - don't know what occurred with order  5/6- HA's doing better  5/8- not requiring T#3 anymore lately  per pt  5/13- Severe HA 5/11 in afternoon- took T#3  5/17 headache today does not sound severe, she has not taken her Tylenol  3 yet but nursing is getting it for her  5/18 headache improved with Tylenol  No. 3 today  5/20- refusing topamax - for past 3+ days- will go back and d/w pt  18. Decreased safety awareness/impulsiveness  4/30- still requiring max cues  5/7- 5/10- no change-per therapy- still requiring max cues  5/23- Still requiring mod-max cues for safety  20. Spasticity  4/22- off baclofen , and c/o LUE pain- wondering how much of her complaints are due to tone?-   4/30- Spasticity improved in LUE- MAS of 0 actually this AM  5/11-12- Getting some spasticity vs disuse tightness inL hand- fingers esp 3rd/4th DIPs- might need Botox after d/c  5/14- MAS of 1 to 1+ in L wrist and fingers- not proximally  5/27- Spasticity slightly worse this AM- esp when yawns- explained that's normal with spasticity due to increased pain  21. 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 .   4/30- pt pulled scab off-  22.  Urinary  and bowel incont due to CVA, continue toileting program 4/23- still cannot control B/B 4/30- on toileting program- is getting somewhat better, but  still incontinent more at night  5/13- still incontinent at night  5/18 continued incontinence   23. 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   4/29- will get Dopplers, because pt says she elevated hand overnight- still swollen- ordered  4/30- Dopplers (-)- will get isotoner compression glove  5/4 received glove  5/6- Swelling ~ 50% better- will  increase Duloxetine  to 120 mg daily for nerve pain in L hand and mood.   5/7- hand sore this AM- but not "killing her".   5/8- pain great this AM- doesn't need meds other than tylenol  per pt -sounds like increased cymbalta  has helped  5/9- Some pain in L hand this Am, but hasn't taken tylenol  or T#3 yet this Am- 5/13- Will decrease Lyrica  to 50 mg q6pm since caused her to have visual changes early in day  5/14- Pain doing better in last 12+ hours- not as painful  24. C/O catch in breath- no SOB  5/9- asked pt to use ICS every 1 hour while awake- since probably is atelectasis   25. Dry eyes continue  eye drops as needed for pt  26. Gout? Swelling of L hand  5/13- pt has never had gout but runs in her family- she wants to try something else ot see if would help L hand- will try Colchicine  0.6 mg daily x 4 days as well as Prednisone  20 mg daily x 4 days-  5/14- pt feels pain and swelling are better- not sure if due to gout, or just prednisone  vs power of suggestion- will con't to monitor  5/15- pt feels swelling and pain is better- maybe it is gout? Vs overall improvement with prednisone   27. Vaginal candidiasis  5/15- pt reports Sx's- will give Diflucan  150 mg x1   5/22- no more complaints  28. Worsening urinary incontinence/frequency at night  Discussed that UA and UC are negative  - 5-31, 6/1: Mostly continent except at nighttime.  Monitor.  LOS: 72 days A FACE TO FACE EVALUATION WAS PERFORMED  Keven Pel Amery Minasyan 04/21/2024, 12:23 PM

## 2024-04-22 DIAGNOSIS — I63511 Cerebral infarction due to unspecified occlusion or stenosis of right middle cerebral artery: Secondary | ICD-10-CM | POA: Diagnosis not present

## 2024-04-22 MED ORDER — MELATONIN 3 MG PO TABS
3.0000 mg | ORAL_TABLET | Freq: Every day | ORAL | Status: DC
  Administered 2024-04-24 – 2024-04-27 (×4): 3 mg via ORAL
  Filled 2024-04-22 (×6): qty 1

## 2024-04-22 NOTE — Progress Notes (Signed)
 Recreational Therapy Session Note  Patient Details  Name: Theresa Watkins MRN: [id number] Date of Birth: 12-31-1965 Today's Date: 04/22/2024   Skilled Therapeutic Interventions/Progress Updates: Pt c/o of not feeling well and declined participation in scheduled therapy.    [name] 04/22/2024, 3:53 PM

## 2024-04-22 NOTE — Plan of Care (Signed)
  Problem: Consults Goal: RH STROKE PATIENT EDUCATION Description: See Patient Education module for education specifics  Outcome: Progressing   Problem: RH BOWEL ELIMINATION Goal: RH STG MANAGE BOWEL WITH ASSISTANCE Description: STG Manage Bowel with toileting Assistance. Outcome: Progressing Goal: RH STG MANAGE BOWEL W/MEDICATION W/ASSISTANCE Description: STG Manage Bowel with Medication with mod I Assistance. Outcome: Progressing   Problem: RH SAFETY Goal: RH STG ADHERE TO SAFETY PRECAUTIONS W/ASSISTANCE/DEVICE Description: STG Adhere to Safety Precautions With cues Assistance/Device. Outcome: Progressing   Problem: RH PAIN MANAGEMENT Goal: RH STG PAIN MANAGED AT OR BELOW PT'S PAIN GOAL Description: Pain managed < 4 with prns Outcome: Progressing   Problem: RH KNOWLEDGE DEFICIT Goal: RH STG INCREASE KNOWLEDGE OF HYPERTENSION Description: Patient and mother will be able to manage HTN using educational resources for medications and dietary modification independently Outcome: Progressing Goal: RH STG INCREASE KNOWLEDGE OF DYSPHAGIA/FLUID INTAKE Description: Patient and mother will be able to manage dysphagia using educational resources for medications and dietary modification independently Outcome: Progressing Goal: RH STG INCREASE KNOWLEGDE OF HYPERLIPIDEMIA Description: Patient and mother will be able to manage HLD using educational resources for medications and dietary modification independently Outcome: Progressing Goal: RH STG INCREASE KNOWLEDGE OF STROKE PROPHYLAXIS Description: Patient and mother will be able to manage secondary risks using educational resources for medications and dietary modification independently Outcome: Progressing   Problem: Consults Goal: RH GENERAL PATIENT EDUCATION Description: See Patient Education module for education specifics. Outcome: Progressing Goal: Skin Care Protocol Initiated - if Braden Score 18 or less Description: If consults are  not indicated, leave blank or document N/A Outcome: Progressing Goal: Nutrition Consult-if indicated Outcome: Progressing Goal: Diabetes Guidelines if Diabetic/Glucose > 140 Description: If diabetic or lab glucose is > 140 mg/dl - Initiate Diabetes/Hyperglycemia Guidelines & Document Interventions  Outcome: Progressing   Problem: RH BOWEL ELIMINATION Goal: RH STG MANAGE BOWEL WITH ASSISTANCE Description: STG Manage Bowel with Assistance. Outcome: Progressing Goal: RH STG MANAGE BOWEL W/MEDICATION W/ASSISTANCE Description: STG Manage Bowel with Medication with Assistance. Outcome: Progressing   Problem: RH BLADDER ELIMINATION Goal: RH STG MANAGE BLADDER WITH ASSISTANCE Description: STG Manage Bladder With Assistance Outcome: Progressing Goal: RH STG MANAGE BLADDER WITH MEDICATION WITH ASSISTANCE Description: STG Manage Bladder With Medication With Assistance. Outcome: Progressing Goal: RH STG MANAGE BLADDER WITH EQUIPMENT WITH ASSISTANCE Description: STG Manage Bladder With Equipment With Assistance Outcome: Progressing   Problem: RH SKIN INTEGRITY Goal: RH STG SKIN FREE OF INFECTION/BREAKDOWN Outcome: Progressing Goal: RH STG MAINTAIN SKIN INTEGRITY WITH ASSISTANCE Description: STG Maintain Skin Integrity With Assistance. Outcome: Progressing Goal: RH STG ABLE TO PERFORM INCISION/WOUND CARE W/ASSISTANCE Description: STG Able To Perform Incision/Wound Care With Assistance. Outcome: Progressing   Problem: RH SAFETY Goal: RH STG ADHERE TO SAFETY PRECAUTIONS W/ASSISTANCE/DEVICE Description: STG Adhere to Safety Precautions With Assistance/Device. Outcome: Progressing Goal: RH STG DECREASED RISK OF FALL WITH ASSISTANCE Description: STG Decreased Risk of Fall With Assistance. Outcome: Progressing   Problem: RH PAIN MANAGEMENT Goal: RH STG PAIN MANAGED AT OR BELOW PT'S PAIN GOAL Outcome: Progressing   Problem: RH KNOWLEDGE DEFICIT GENERAL Goal: RH STG INCREASE KNOWLEDGE OF  SELF CARE AFTER HOSPITALIZATION Outcome: Progressing

## 2024-04-22 NOTE — Progress Notes (Signed)
 Speech Language Pathology Weekly Progress and Session Note  Patient Details  Name: Theresa Watkins MRN: [id number] Date of Birth: Jul 05, 1966  Beginning of progress report period: Apr 15, 2024 End of progress report period: April 22, 2024  Today's Date: 04/22/2024 SLP Individual Time: 930-954   Short Term Goals: Week 8: SLP Short Term Goal 1 (Week 8): (Week 10) Patient will communicate complex information with 90% intelligibility and mod iA SLP Short Term Goal 1 - Progress (Week 8): Progressing toward goal SLP Short Term Goal 2 (Week 8): (Week 10) Patient will utilize safe swallowing strategies during consumption of regular/thin diet with mod i multimodal A SLP Short Term Goal 2 - Progress (Week 8): Progressing toward goal  New Short Term Goals: Week 8: SLP Short Term Goal 1 (Week 8): (Week 11) Patient will communicate complex information with 90% intelligibility and mod iA SLP Short Term Goal 1 - Progress (Week 8): Other (comment) SLP Short Term Goal 2 (Week 8): (Week 11) Patient will utilize safe swallowing strategies during consumption of regular/thin diet with mod i multimodal A SLP Short Term Goal 2 - Progress (Week 8): Other (comment)  Weekly Progress Updates: Patient made steady gains this reporting period. Patient tolerates regular/thin liquid diet but continues to benefit from full supervision due to inconsistent use of compenstaory strategies and remaining high risk of aspiration. Speech intelligibility fluctuates between 70-90% to familiar and unfamiliar listeners with pt able to idenitfy some variables that contribute to fluctuating  speech intelligibility. Pt/ family education ongoing. Pt would benefit from continued skilled SLP intervention to maximize swallowing and speech in order to maximize her functional independence prior to discharge.  Intensity: Minumum of 1-2 x/day, 30 to 90 minutes Frequency: 1 to 3 out of 7 days Duration/Length of Stay:  TBD Treatment/Interventions: Multimodal communication approach;Speech/Language facilitation;Functional tasks;Therapeutic Activities;Therapeutic Exercise;Internal/external aids;Dysphagia/aspiration precaution training;Patient/family education  Daily Session  Skilled Therapeutic Interventions: SLP conducted skilled therapy session targeting communication goals. SLP targeted completion of sentence- and conversation-level task with patient utilizing SLOP speech intelligibility strategies. While patient has the ability to utilize speech strategies consistently, she has behaviorally plateaued and is unlikely to meet modI goals. SLP will downgrade goals to reflect current need for supervision. During this session, patient required min encouragement throughout to keep volume up. Patient was left in room with call bell in reach and alarm set. SLP will continue to target goals per plan of care.        Pain  None endorsed   Therapy/Group: Individual Therapy  Theresa Watkins, M.A., CCC-SLP  Theresa Watkins 04/22/2024, 8:07 AM

## 2024-04-22 NOTE — Plan of Care (Signed)
  Problem: Consults Goal: RH STROKE PATIENT EDUCATION Description: See Patient Education module for education specifics  Outcome: Progressing   Problem: RH BOWEL ELIMINATION Goal: RH STG MANAGE BOWEL WITH ASSISTANCE Description: STG Manage Bowel with toileting Assistance. Outcome: Progressing Goal: RH STG MANAGE BOWEL W/MEDICATION W/ASSISTANCE Description: STG Manage Bowel with Medication with mod I Assistance. Outcome: Progressing   Problem: RH SAFETY Goal: RH STG ADHERE TO SAFETY PRECAUTIONS W/ASSISTANCE/DEVICE Description: STG Adhere to Safety Precautions With cues Assistance/Device. Outcome: Progressing   Problem: RH PAIN MANAGEMENT Goal: RH STG PAIN MANAGED AT OR BELOW PT'S PAIN GOAL Description: Pain managed < 4 with prns Outcome: Progressing   Problem: RH BOWEL ELIMINATION Goal: RH STG MANAGE BOWEL WITH ASSISTANCE Description: STG Manage Bowel with Assistance. Outcome: Progressing Goal: RH STG MANAGE BOWEL W/MEDICATION W/ASSISTANCE Description: STG Manage Bowel with Medication with Assistance. Outcome: Progressing   Problem: RH BLADDER ELIMINATION Goal: RH STG MANAGE BLADDER WITH ASSISTANCE Description: STG Manage Bladder With Assistance Outcome: Progressing Goal: RH STG MANAGE BLADDER WITH MEDICATION WITH ASSISTANCE Description: STG Manage Bladder With Medication With Assistance. Outcome: Progressing Goal: RH STG MANAGE BLADDER WITH EQUIPMENT WITH ASSISTANCE Description: STG Manage Bladder With Equipment With Assistance Outcome: Progressing

## 2024-04-22 NOTE — Plan of Care (Signed)
 Goals downgraded to reflect plateau in progress, fluctuating need for supervision/min assistance, and patient's intermittent use of speech strategies.   Problem: RH Swallowing Goal: LTG Patient will consume least restrictive diet using compensatory strategies with assistance (SLP) Description: LTG:  Patient will consume least restrictive diet using compensatory strategies with assistance (SLP) Flowsheets (Taken 04/22/2024 1002) LTG: Pt Patient will consume least restrictive diet using compensatory strategies with assistance of (SLP): Supervision   Problem: RH Expression Communication Goal: LTG Patient will increase speech intelligibility (SLP) Description: LTG: Patient will increase speech intelligibility at word/phrase/conversation level with cues, % of the time (SLP) Flowsheets (Taken 04/22/2024 1002) LTG: Patient will increase speech intelligibility (SLP): Supervision Level: Conversation level Percent of time patient will use intelligible speech: 90

## 2024-04-22 NOTE — Progress Notes (Signed)
 Occupational Therapy Session Note  Patient Details  Name: Theresa Watkins MRN: [id number] Date of Birth: Apr 28, 1966  Today's Date: 04/22/2024 OT Individual Time: 1130-1200 OT Individual Time Calculation (min): 30 min    Short Term Goals: Week 10: OT Short Term Goal 1 (Week 10): Pt will perform toileting tasks with CGA OT Short Term Goal 2 (Week 910): Pt will use LUE as gross assist with min A during functional tasks  Skilled Therapeutic Interventions/Progress Updates:    Skilled OT intervention with focus on LUE functional reaching tasks and grasp strengthening to increase pt's independene with BADLs. Shoulder flexion in supine and sitting EOB with min A for full range. Pt issued pink foam block for grasp strengthening Educated on SROM. Pt return demonstrated. Pt reamined in bed with all needs within reach. Bed alarm activated.   Therapy Documentation Precautions:  Precautions Precautions: Fall Precaution/Restrictions Comments: L hemi Restrictions Weight Bearing Restrictions Per Provider Order: No RUE Weight Bearing Per Provider Order: Partial weight bearing   Pain: Pain Assessment Pain Scale: 0-10 Pain Score: 0-No pain   Therapy/Group: Individual Therapy  [name] 04/22/2024, 12:13 PM

## 2024-04-22 NOTE — Progress Notes (Signed)
 Physical Therapy Session Note  Patient Details  Name: Theresa Watkins MRN: [id number] Date of Birth: 01/01/1966  Today's Date: 04/22/2024 PT Individual Time: 1030-1055 PT Individual Time Calculation (min): 25 min   Short Term Goals: Week 9: PT Short Term Goal 1 (Week 9): Pt will ambulate with RW and min a >100 ft PT Short Term Goal 2 (Week 9): Pt will navigate stairs with min a  Skilled Therapeutic Interventions/Progress Updates:    pt received in bed and agreeable to therapy. Pt slow to arouse and with increased time for dressing tasks d/t drowsiness, so increased assist at this time. Session focused on ambulation. Min a to stand this date. Pt ambulated x 130 ft and 90 ft with min a and +1 w/c follow. Continued uneven cadence and step to gait pattern with decreased LLE reliance. Cues for RW proximity but pt minimally receptive at this time. Pt returned to bed with CGA squat pivot and was left with all needs in reach and alarm active.   Therapy Documentation Precautions:  Precautions Precautions: Fall Precaution/Restrictions Comments: L hemi Restrictions Weight Bearing Restrictions Per Provider Order: No RUE Weight Bearing Per Provider Order: Partial weight bearing General:       Therapy/Group: Individual Therapy  Tex Filbert 04/22/2024, 12:57 PM

## 2024-04-22 NOTE — Progress Notes (Addendum)
 PROGRESS NOTE   Pt reports doing well- too sleepy due to melatonin 5 mg- and asked for it to be lower dose/earlier. Will make changes.   Denies major L hand pain  ROS:   Pt denies SOB, abd pain, CP, N/V/C/D, and vision changes  Objective:   No results found.   No results for input(s): "WBC", "HGB", "HCT", "PLT" in the last 72 hours.   No results for input(s): "NA", "K", "CL", "CO2", "GLUCOSE", "BUN", "CREATININE", "CALCIUM " in the last 72 hours.    Intake/Output Summary (Last 24 hours) at 04/22/2024 0939 Last data filed at 04/21/2024 1800 Gross per 24 hour  Intake 480 ml  Output --  Net 480 ml      Physical Exam: Vital Signs Blood pressure 128/84, pulse 73, temperature 98.4 F (36.9 C), temperature source Oral, resp. rate 17, height 5\' 6"  (1.676 m), weight 72.3 kg, SpO2 92%.    General: awake, alert, appropriate, supine in bed; NAD HENT: conjugate gaze; oropharynx moist CV: regular rate and rhythm; no JVD Pulmonary: CTA B/L; no W/R/R- good air movement GI: soft, NT, ND, (+)BS Psychiatric: appropriate but sleepy Neurological: more sleepy this AM, but still talkative- mod dysarthria  MSK- L hand less swelling- looks even better today- in WHO; - tips of fingers wont' fully extend- tiny bit of flexion at tips MAS of 1- 1+ in L wrist and hand/fingers MSK-L hand Less TTP- less swelling- not wearing glove today Neuro: cognitively at baseline. Alert. Sl dysarthria. CN without changes. Able to count fingers. MAS of 0-1 in LUE Extremities: L hand - less swelling and using to help feed herself MMT: 5/5 on right side LUE 0/5, LLE 3/5 Skin-no apparent lesions  Physical exam unchanged from the above on reexamination 04/22/24    Assessment/Plan: 1. Functional deficits which require 3+ hours per day of interdisciplinary therapy in a comprehensive inpatient rehab setting. Physiatrist is providing close team supervision  and 24 hour management of active medical problems listed below. Physiatrist and rehab team continue to assess barriers to discharge/monitor patient progress toward functional and medical goals  Care Tool:  Bathing    Body parts bathed by patient: Left arm, Chest, Abdomen, Front perineal area, Buttocks, Right upper leg, Left upper leg, Right lower leg, Left lower leg, Face   Body parts bathed by helper: Right arm Body parts n/a: Right arm   Bathing assist Assist Level: Minimal Assistance - Patient > 75%     Upper Body Dressing/Undressing Upper body dressing   What is the patient wearing?: Pull over shirt    Upper body assist Assist Level: Supervision/Verbal cueing    Lower Body Dressing/Undressing Lower body dressing      What is the patient wearing?: Pants, Underwear/pull up     Lower body assist Assist for lower body dressing: Minimal Assistance - Patient > 75%     Toileting Toileting    Toileting assist Assist for toileting: Minimal Assistance - Patient > 75%     Transfers Chair/bed transfer  Transfers assist     Chair/bed transfer assist level: Contact Guard/Touching assist     Locomotion Ambulation   Ambulation assist      Assist  level: Minimal Assistance - Patient > 75% Assistive device: Walker-rolling Max distance: 170   Walk 10 feet activity   Assist     Assist level: Minimal Assistance - Patient > 75% Assistive device: Walker-hemi   Walk 50 feet activity   Assist Walk 50 feet with 2 turns activity did not occur: Safety/medical concerns (L hemiplegia and pt fatigue)  Assist level: Minimal Assistance - Patient > 75% Assistive device: Walker-hemi    Walk 150 feet activity   Assist Walk 150 feet activity did not occur: Safety/medical concerns (L hemiplegia and pt fatigue)  Assist level: Minimal Assistance - Patient > 75% Assistive device: Walker-hemi    Walk 10 feet on uneven surface  activity   Assist Walk 10 feet on uneven  surfaces activity did not occur: Safety/medical concerns (L hemiplegia and pt fatigue)         Wheelchair     Assist Is the patient using a wheelchair?: Yes Type of Wheelchair: Manual    Wheelchair assist level: Supervision/Verbal cueing Max wheelchair distance: 150    Wheelchair 50 feet with 2 turns activity    Assist        Assist Level: Supervision/Verbal cueing   Wheelchair 150 feet activity     Assist      Assist Level: Supervision/Verbal cueing   Blood pressure 128/84, pulse 73, temperature 98.4 F (36.9 C), temperature source Oral, resp. rate 17, height 5\' 6"  (1.676 m), weight 72.3 kg, SpO2 92%.   Medical Problem List and Plan: 1. 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 Search for facility ongoing. May only be able to go to custodial care facility  D/c SNF-  Con't CIR 1x/day  Have found location  of SNF< but awaiting insurance approval- pt agreed.   2. DVT/anticoagulation:  Pharmaceutical: Lovenox              -antiplatelet therapy: continue Aspirin  81 mg daily and Plavix  75 mg daily x 3 weeks then Plavix  alone  3. Pain Management: continue Tylenol  as needed  4/3- having new burning pain in LUE- which is seen sometimes with stroke- will add Duloxetine  30 mg daily for nerve pain- monitor for side effects.  -if gets nausea, will change to Lyrica  4/4-6 no side effects so far-  4/8- will increase Duloxetine  to 60 mg daily  DC hydrocodone  due to side effects, trial tyl #3  as needed for pain that is not relieved by Tylenol . 4/28- advised pt to use voltaren  gel  on all sides of L hand including between fingers- use tylenol  #3 as needed 5/1- pt said using voltaren  gel and tylenol  #3 as  needed- adding lidoderm  patches 2 patches at night for back pain 5/2- HA's- will restart SCHEDULED Topamax  75 mg at bedtime- not sure what occurred? Needs to get nightly 5/11- Add Lyrica  50 mg BID for nerve pain in L hand 5/13- changed to q6pm since pt feels it affected her vision-made it worse 5/14- said pain better today- not clear if due to prednisone  vs Lyrica ? 5/15 pain much better in L hand-  pt feels it's gout? It might be 5/16- hand pain slightly better with ACE wrap overnight- which  is fine 5/20- pt reports having migraines every AM, but refusing topamax  last few days- at least 3- will go back and d/w pt and NOT make changes as we discussed 5/21- will reduce Topamax  to 25 mg at bedtime to help with feelinga little confused 5/22- Will stop Topamax  since made her so "loopy"- pt doesn't want any other meds to try to help HA's- said will take Tylenol  5/26- Still having migraines intermittently 5/27- L hand and wrist pain- radiates to L shoulder- no deformity- but holds hand in vulcan gesture at rest- xrays pending 5/29- 5/30- hand swelling and pain almost back to baseline 5-31: Last use of as needed Tylenol  with codeine  5-29, patient wishes to keep on board.  Defer to primary team 6/5- will con't tylenol  #3 as needed 4. Depression: continue duloxetine         5. Neuropsych/cognition: This patient is capable of making decisions on her own behalf. 6. Skin/Wound Care: Routine skin checks 7. Fluids/Electrolytes/Nutrition:encouraging po.   5/12- I personally reviewed the patient's labs today.     -encouraging PO 8.  Dysphagia.  Dysphagia 3 with thins currently  4/18 intake ok 5/11-5/13- advised pt to take smaller bites-  intake good, but takes too big of bites    5/14- switched back to full supervision- pt unhappy, but wasn't listening when I discussed more of how to eat/take bites 5/16- taking smaller bites with full supervision 5/18 eating 65 to 95% of her meals today 5/22- Back to full  supervision- has to sit completely upright to eat 5/23- if this goes better, can go back to intermittent supervision once SLP decided 5-31: P.o. intakes appear adequate.  9.  Hyperlipidemia.  Continue Crestor  10.  Recent History of cocaine/tobacco use.  UDS positive for cocaine.  Provide counseling, this has been a chronic issue , seen in PCP notes 11.  Hypothyroidism. continue Synthroid   12.  Constipation.   Senokot-S 2 tab twice daily  5/6- LBM yesterday even though chart says 5/3  5/7- will add Miralax - esp since increased Duloxetine  which can cause constipation- already on Senna 2 tabs BID  5-31: Last bowel movement, medium  6/5- Last Bm 6/2- will d/w pt if no BM by tomorrow  15. Blurry vision and dysconjugate gaze  3/27- will need Ophtho after d/c.   4/4- pt reports everything blurry- cannot see faces as a result- never had eye checked in past- used readers but cannot see at a distance since stroke- will send to Ophtho after d/c.   4/7- d/w pt again- she wants eyeglasses- explained we cannot get when in hospital- also per guidelines, since vision can still change, to not get for 3 months after stroke. She disbelieves it's from stroke, since had 2 prior- that didn't cause vision changes- we educated pt pn how this can occur.   4/25- reminded pt cannot get Optho in hospital- need to get once d/c'd  4/30 went over visual changes with pt- will need to get glasses, likely prisms 2-3 months after d/c once vision stabilizes  5/6- d/w pt again- she wants to see Optho- explained they do not come into hospital for this  5/18 continued blurry vision, discussed outpatient f/u  5/23- pt having blurry vision again this AM, she says slightly worse- will con't to monitor  5-31: No complaints of blurry vision  16.  Left hip pain.  -3-29 x-ray showing moderate degenerative joint disease of the hip; no fracture  -Continue Voltaren  gel to hip 4 times daily  -  May benefit from steroid injection as an  outpatient  3/31- started Vit D and Ca per pt request  - improved to an extent  4/24- Pt reports L hip still hurts when stands/works with therapy however don't want her on opiates regularly due to can impair sensorium and she also ready has issues  5/20- pt refusing voltaren  gel  17. Severe HA-daily HA's  4/15-16 sx appear improved with topamax , tylenol  helps also   -may use sx to get out of therapy at times?  4/23- Per team, pt having HA's frequently which impairs therapy- she won't discuss HA's with me- unclear if only has a day goes on vs self limting behavior  4/24- increase to 75 mg at bedtime  4/25- still having HA's, actually thinks a little worse since not taking Norco for it-   4/28-4/29 fewer complaints about HA this AM  4/30-5/1- - per staff, c/o these less- not interfering with therapy as much  5/2- Wasn't getting topamax - just taking T#3- wil restart Topamax - don't know what occurred with order  5/6- HA's doing better  5/8- not requiring T#3 anymore lately per pt  5/13- Severe HA 5/11 in afternoon- took T#3  5/17 headache today does not sound severe, she has not taken her Tylenol  3 yet but nursing is getting it for her  5/18 headache improved with Tylenol  No. 3 today  5/20- refusing topamax - for past 3+ days- will go back and d/w pt  6/5- stopped Topamax - due ot side effects 18. Decreased safety awareness/impulsiveness  4/30- still requiring max cues  5/7- 5/10- no change-per therapy- still requiring max cues  5/23- Still requiring mod-max cues for safety  20. Spasticity  4/22- off baclofen , and c/o LUE pain- wondering how much of her complaints are due to tone?-   4/30- Spasticity improved in LUE- MAS of 0 actually this AM  5/11-12- Getting some spasticity vs disuse tightness inL hand- fingers esp 3rd/4th DIPs- might need Botox after d/c  5/14- MAS of 1 to 1+ in L wrist and fingers- not proximally  5/27- Spasticity slightly worse this AM- esp when yawns- explained that's  normal with spasticity due to increased pain  21. 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 .   4/30- pt pulled scab off-  22.  Urinary  and bowel incont due to CVA, continue toileting program 4/23- still cannot control B/B 4/30- on toileting program- is getting somewhat better, but still incontinent more at night  5/13- still incontinent at night  5/18 continued incontinence   23. 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   4/29- will get Dopplers, because pt says she elevated hand overnight- still swollen- ordered  4/30- Dopplers (-)- will get isotoner compression glove  5/4 received glove  5/6- Swelling ~ 50% better- will increase Duloxetine  to 120 mg daily for nerve pain in L hand and mood.   5/7- hand sore this AM- but not "killing her".   5/8- pain great this AM- doesn't need meds other than tylenol  per pt -sounds like increased cymbalta  has helped  5/9- Some  pain in L hand this Am, but hasn't taken tylenol  or T#3 yet this Am- 5/13- Will decrease Lyrica  to 50 mg q6pm since caused her to have visual changes early in day  5/14- Pain doing better in last 12+ hours- not as painful  24. C/O catch in breath- no SOB  5/9- asked pt to use ICS every 1 hour while awake- since probably is atelectasis   25. Dry eyes continue  eye drops as needed for pt  26. Gout? Swelling of L hand  5/13- pt has never had gout but runs in her family- she wants to try something else ot see if would help L hand- will try Colchicine  0.6 mg daily x 4 days as well as Prednisone  20 mg daily x 4 days-  5/14- pt feels pain and  swelling are better- not sure if due to gout, or just prednisone  vs power of suggestion- will con't to monitor  5/15- pt feels swelling and pain is better- maybe it is gout? Vs overall improvement with prednisone   27. Vaginal candidiasis  5/15- pt reports Sx's- will give Diflucan  150 mg x1   5/22- no more complaints  28. Worsening urinary incontinence/frequency at night  Discussed that UA and UC are negative  - 5-31, 6/1: Mostly continent except at nighttime.  Monitor. 29. Sedation with Melatonin  6/5- will reduce to 3 mg at bedtime and change to 8pm   I spent a total of 36    minutes on total care today- >50% coordination of care- due to  Reviewed meds with pt and nursing and made changes- also d/w SW about plan for SNF  LOS: 73 days A FACE TO FACE EVALUATION WAS PERFORMED  Hammond Obeirne 04/22/2024, 9:39 AM

## 2024-04-23 DIAGNOSIS — I63511 Cerebral infarction due to unspecified occlusion or stenosis of right middle cerebral artery: Secondary | ICD-10-CM | POA: Diagnosis not present

## 2024-04-23 NOTE — Progress Notes (Signed)
 Patient ID: Theresa Watkins, female   DOB: [birth year], 58 y.o.   MRN: [id number]   SW spoke with [name]/liaison with [facility] to follow-up about ins auth. She will follow-up after speaking with admissions coord.   Norval Been, MSW, LCSW Office: [phone] Cell: [phone] Fax: [phone]

## 2024-04-23 NOTE — Plan of Care (Signed)
  Problem: Consults Goal: RH STROKE PATIENT EDUCATION Description: See Patient Education module for education specifics  Outcome: Progressing   Problem: RH BOWEL ELIMINATION Goal: RH STG MANAGE BOWEL WITH ASSISTANCE Description: STG Manage Bowel with toileting Assistance. Outcome: Progressing Goal: RH STG MANAGE BOWEL W/MEDICATION W/ASSISTANCE Description: STG Manage Bowel with Medication with mod I Assistance. Outcome: Progressing   Problem: RH SAFETY Goal: RH STG ADHERE TO SAFETY PRECAUTIONS W/ASSISTANCE/DEVICE Description: STG Adhere to Safety Precautions With cues Assistance/Device. Outcome: Progressing   Problem: RH PAIN MANAGEMENT Goal: RH STG PAIN MANAGED AT OR BELOW PT'S PAIN GOAL Description: Pain managed < 4 with prns Outcome: Progressing   Problem: RH KNOWLEDGE DEFICIT Goal: RH STG INCREASE KNOWLEDGE OF HYPERTENSION Description: Patient and mother will be able to manage HTN using educational resources for medications and dietary modification independently Outcome: Progressing

## 2024-04-23 NOTE — Progress Notes (Signed)
 Speech Language Pathology Daily Session Note  Patient Details  Name: Theresa Watkins MRN: [id number] Date of Birth: September 26, 1966  Today's Date: 04/23/2024 SLP Individual Time: 9563-8756 SLP Individual Time Calculation (min): 32 min  Short Term Goals: Week 8: SLP Short Term Goal 1 (Week 8): (Week 11) Patient will communicate complex information with 90% intelligibility and mod iA SLP Short Term Goal 1 - Progress (Week 8): Other (comment) SLP Short Term Goal 2 (Week 8): (Week 11) Patient will utilize safe swallowing strategies during consumption of regular/thin diet with mod i multimodal A SLP Short Term Goal 2 - Progress (Week 8): Other (comment)  Skilled Therapeutic Interventions: SLP conducted skilled therapy session targeting dysphagia goals. SLP assisted with setup with regular/thin liquid mealtray, then provided supervision assist throughout meal for use of swallowing strategies. No overt s/sx of penetration/aspiration observed throughout. Patient was left in room with call bell in reach and alarm set. SLP will continue to target goals per plan of care.       Pain Pain Assessment Pain Scale: 0-10 Pain Score: 0-No painNone endorsed   Therapy/Group: Individual Therapy  [name], M.A., CCC-SLP  Denaisha Swango A Sarissa Dern 04/23/2024, 8:49 AM

## 2024-04-23 NOTE — Plan of Care (Signed)
  Problem: RH BOWEL ELIMINATION Goal: RH STG MANAGE BOWEL W/MEDICATION W/ASSISTANCE Description: STG Manage Bowel with Medication with mod I Assistance. Outcome: Progressing   Problem: RH SAFETY Goal: RH STG ADHERE TO SAFETY PRECAUTIONS W/ASSISTANCE/DEVICE Description: STG Adhere to Safety Precautions With cues Assistance/Device. Outcome: Progressing   Problem: RH PAIN MANAGEMENT Goal: RH STG PAIN MANAGED AT OR BELOW PT'S PAIN GOAL Description: Pain managed < 4 with prns Outcome: Progressing   Problem: RH BOWEL ELIMINATION Goal: RH STG MANAGE BOWEL WITH ASSISTANCE Description: STG Manage Bowel with Assistance. Outcome: Progressing Goal: RH STG MANAGE BOWEL W/MEDICATION W/ASSISTANCE Description: STG Manage Bowel with Medication with Assistance. Outcome: Progressing   Problem: RH SKIN INTEGRITY Goal: RH STG MAINTAIN SKIN INTEGRITY WITH ASSISTANCE Description: STG Maintain Skin Integrity With Assistance. Outcome: Progressing   Problem: RH SAFETY Goal: RH STG ADHERE TO SAFETY PRECAUTIONS W/ASSISTANCE/DEVICE Description: STG Adhere to Safety Precautions With Assistance/Device. Outcome: Progressing

## 2024-04-23 NOTE — Progress Notes (Signed)
 Occupational Therapy Session Note  Patient Details  Name: Theresa Watkins MRN: [id number] Date of Birth: 27-Aug-1966  Today's Date: 04/23/2024 OT Individual Time: 1130-1200 OT Individual Time Calculation (min): 30 min    Short Term Goals: Week 9: OT Short Term Goal 1 (Week 9): Pt will perform toileting tasks with CGA OT Short Term Goal 1 - Progress (Week 9): Progressing toward goal OT Short Term Goal 2 (Week 9): Pt will use LUE as gross assist with min A during functional tasks OT Short Term Goal 2 - Progress (Week 9): Progressing toward goal  Skilled Therapeutic Interventions/Progress Updates:    Pt sitting up in bed upon arrival. OT intervention with focus on LUE function/reaching: catching small beach ball with BUE, chest passes with BUE, ball taps/punches with LUE. Pt demonstrated squeezing ball with BUE for strengthening and control. Pt continues to demonstrate improved funcitonal use of LUE during tasks. Pt remained in bed with all needs within reach. Bed alarm activated.   Therapy Documentation Precautions:  Precautions Precautions: Fall Precaution/Restrictions Comments: L hemi Restrictions Weight Bearing Restrictions Per Provider Order: No RUE Weight Bearing Per Provider Order: Partial weight bearing   Pain: Pt denies pain this morning  Therapy/Group: Individual Therapy  [name] 04/23/2024, 12:07 PM

## 2024-04-23 NOTE — Progress Notes (Signed)
 PROGRESS NOTE  Pt reports her neighbor was noisy all night so didn't sleep- so sleepy again this AM.   Hasn't eaten yet which is unusual for her.  Wants to go back to sleep.   ROS:    Pt denies SOB, abd pain, CP, N/V/C/D, and vision changes   Objective:   No results found.   No results for input(s): "WBC", "HGB", "HCT", "PLT" in the last 72 hours.   No results for input(s): "NA", "K", "CL", "CO2", "GLUCOSE", "BUN", "CREATININE", "CALCIUM " in the last 72 hours.    Intake/Output Summary (Last 24 hours) at 04/23/2024 0913 Last data filed at 04/22/2024 1955 Gross per 24 hour  Intake 1160 ml  Output --  Net 1160 ml      Physical Exam: Vital Signs Blood pressure 109/78, pulse 76, temperature 97.7 F (36.5 C), temperature source Oral, resp. rate 17, height 5\' 6"  (1.676 m), weight 72.3 kg, SpO2 92%.     General: awake, alert, appropriate, supine- curled up in pillows; NAD HENT: conjugate gaze; oropharynx moist CV: regular rate and rhythm; no JVD Pulmonary: CTA B/L; no W/R/R- good air movement GI: soft, NT, ND, (+)BS Psychiatric: appropriate- sleepy Neurological: Ox3 Sleepy- moderate dysarthria- stable- worse in AM  MSK- L hand less swelling- looks even better today- in WHO; - tips of fingers wont' fully extend- tiny bit of flexion at tips MAS of 1- 1+ in L wrist and hand/fingers MSK-L hand Less TTP- less swelling- not wearing glove today Neuro: cognitively at baseline. Alert. Sl dysarthria. CN without changes. Able to count fingers. MAS of 0-1 in LUE Extremities: L hand - less swelling and using to help feed herself MMT: 5/5 on right side LUE 0/5, LLE 3/5 Skin-no apparent lesions  Physical exam unchanged from the above on reexamination 04/23/24    Assessment/Plan: 1. Functional deficits which require 3+ hours per day of interdisciplinary therapy in a comprehensive inpatient rehab setting. Physiatrist is  providing close team supervision and 24 hour management of active medical problems listed below. Physiatrist and rehab team continue to assess barriers to discharge/monitor patient progress toward functional and medical goals  Care Tool:  Bathing    Body parts bathed by patient: Left arm, Chest, Abdomen, Front perineal area, Buttocks, Right upper leg, Left upper leg, Right lower leg, Left lower leg, Face   Body parts bathed by helper: Right arm Body parts n/a: Right arm   Bathing assist Assist Level: Minimal Assistance - Patient > 75%     Upper Body Dressing/Undressing Upper body dressing   What is the patient wearing?: Pull over shirt    Upper body assist Assist Level: Supervision/Verbal cueing    Lower Body Dressing/Undressing Lower body dressing      What is the patient wearing?: Pants, Underwear/pull up     Lower body assist Assist for lower body dressing: Minimal Assistance - Patient > 75%     Toileting Toileting    Toileting assist Assist for toileting: Minimal Assistance - Patient > 75%     Transfers Chair/bed transfer  Transfers assist     Chair/bed transfer assist level: Contact Guard/Touching assist     Locomotion Ambulation   Ambulation  assist      Assist level: Minimal Assistance - Patient > 75% Assistive device: Walker-rolling Max distance: 90   Walk 10 feet activity   Assist     Assist level: Minimal Assistance - Patient > 75% Assistive device: Walker-hemi   Walk 50 feet activity   Assist Walk 50 feet with 2 turns activity did not occur: Safety/medical concerns (L hemiplegia and pt fatigue)  Assist level: Minimal Assistance - Patient > 75% Assistive device: Walker-hemi    Walk 150 feet activity   Assist Walk 150 feet activity did not occur: Safety/medical concerns (L hemiplegia and pt fatigue)  Assist level: Minimal Assistance - Patient > 75% Assistive device: Walker-hemi    Walk 10 feet on uneven surface   activity   Assist Walk 10 feet on uneven surfaces activity did not occur: Safety/medical concerns (L hemiplegia and pt fatigue)         Wheelchair     Assist Is the patient using a wheelchair?: Yes Type of Wheelchair: Manual    Wheelchair assist level: Supervision/Verbal cueing Max wheelchair distance: 150    Wheelchair 50 feet with 2 turns activity    Assist        Assist Level: Supervision/Verbal cueing   Wheelchair 150 feet activity     Assist      Assist Level: Supervision/Verbal cueing   Blood pressure 109/78, pulse 76, temperature 97.7 F (36.5 C), temperature source Oral, resp. rate 17, height 5\' 6"  (1.676 m), weight 72.3 kg, SpO2 92%.   Medical Problem List and Plan: 1. 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 Search for facility ongoing. May only be able to go to custodial care facility  D/c SNF-  Con't CIR 1x/day  Have found location  of SNF< but awaiting insurance approval- pt agreed.   2. DVT/anticoagulation:  Pharmaceutical: Lovenox              -antiplatelet therapy: continue Aspirin  81 mg daily and Plavix  75 mg daily x 3 weeks then Plavix  alone  3. Pain Management: continue Tylenol  as needed  4/3- having new burning pain in LUE- which is seen sometimes with stroke- will add Duloxetine  30 mg daily for nerve pain- monitor for side effects.  -if gets nausea, will change to Lyrica  4/4-6 no side effects so far-  4/8- will increase Duloxetine  to 60 mg daily  DC hydrocodone  due to side effects, trial tyl #3  as needed for pain that is not relieved by Tylenol . 4/28- advised pt to use voltaren  gel  on all sides of L hand including between fingers- use tylenol  #3 as needed 5/1- pt  said using voltaren  gel and tylenol  #3 as needed- adding lidoderm  patches 2 patches at night for back pain 5/2- HA's- will restart SCHEDULED Topamax  75 mg at bedtime- not sure what occurred? Needs to get nightly 5/11- Add Lyrica  50 mg BID for nerve pain in L hand 5/13- changed to q6pm since pt feels it affected her vision-made it worse 5/14- said pain better today- not clear if due to prednisone  vs Lyrica ? 5/15 pain much better in L hand-  pt feels it's gout? It might be 5/16- hand pain  slightly better with ACE wrap overnight- which is fine 5/20- pt reports having migraines every AM, but refusing topamax  last few days- at least 3- will go back and d/w pt and NOT make changes as we discussed 5/21- will reduce Topamax  to 25 mg at bedtime to help with feelinga little confused 5/22- Will stop Topamax  since made her so "loopy"- pt doesn't want any other meds to try to help HA's- said will take Tylenol  5/26- Still having migraines intermittently 5/27- L hand and wrist pain- radiates to L shoulder- no deformity- but holds hand in vulcan gesture at rest- xrays pending 5/29- 5/30- hand swelling and pain almost back to baseline 5-31: Last use of as needed Tylenol  with codeine  5-29, patient wishes to keep on board.  Defer to primary team 6/5- will con't tylenol  #3 as needed 4. Depression: continue duloxetine         5. Neuropsych/cognition: This patient is capable of making decisions on her own behalf. 6. Skin/Wound Care: Routine skin checks 7. Fluids/Electrolytes/Nutrition:encouraging po.   5/12- I personally reviewed the patient's labs today.     -encouraging PO 8.  Dysphagia.  Dysphagia 3 with thins currently  4/18 intake ok 5/11-5/13- advised pt to take smaller bites-  intake good, but takes too big of bites    5/14- switched back to full supervision- pt unhappy, but wasn't listening when I discussed more of how to eat/take bites 5/16- taking smaller bites with full supervision 5/18 eating 65 to  95% of her meals today 5/22- Back to full supervision- has to sit completely upright to eat 5/23- if this goes better, can go back to intermittent supervision once SLP decided 5-31: P.o. intakes appear adequate.  9.  Hyperlipidemia.  Continue Crestor  10.  Recent History of cocaine/tobacco use.  UDS positive for cocaine.  Provide counseling, this has been a chronic issue , seen in PCP notes 11.  Hypothyroidism. continue Synthroid   12.  Constipation.   Senokot-S 2 tab twice daily  5/6- LBM yesterday even though chart says 5/3  5/7- will add Miralax - esp since increased Duloxetine  which can cause constipation- already on Senna 2 tabs BID  5-31: Last bowel movement, medium  6/5- Last Bm 6/2- will d/w pt if no BM by tomorrow  15. Blurry vision and dysconjugate gaze  3/27- will need Ophtho after d/c.   4/4- pt reports everything blurry- cannot see faces as a result- never had eye checked in past- used readers but cannot see at a distance since stroke- will send to Ophtho after d/c.   4/7- d/w pt again- she wants eyeglasses- explained we cannot get when in hospital- also per guidelines, since vision can still change, to not get for 3 months after stroke. She disbelieves it's from stroke, since had 2 prior- that didn't cause vision changes- we educated pt pn how this can occur.   4/25- reminded pt cannot get Optho in hospital- need to get once d/c'd  4/30 went over visual changes with pt- will need to get glasses, likely prisms 2-3 months after d/c once vision stabilizes  5/6- d/w pt again- she wants to see Optho- explained they do not come into hospital for this  5/18 continued blurry vision, discussed outpatient f/u  5/23- pt having blurry vision again this AM, she says slightly worse- will con't to monitor  5-31: No complaints of blurry vision  16.  Left hip pain.  -3-29 x-ray showing moderate degenerative joint disease of the hip; no fracture  -Continue Voltaren   gel to hip 4 times  daily  -May benefit from steroid injection as an outpatient  3/31- started Vit D and Ca per pt request  - improved to an extent  4/24- Pt reports L hip still hurts when stands/works with therapy however don't want her on opiates regularly due to can impair sensorium and she also ready has issues  5/20- pt refusing voltaren  gel  17. Severe HA-daily HA's  4/15-16 sx appear improved with topamax , tylenol  helps also   -may use sx to get out of therapy at times?  4/23- Per team, pt having HA's frequently which impairs therapy- she won't discuss HA's with me- unclear if only has a day goes on vs self limting behavior  4/24- increase to 75 mg at bedtime  4/25- still having HA's, actually thinks a little worse since not taking Norco for it-   4/28-4/29 fewer complaints about HA this AM  4/30-5/1- - per staff, c/o these less- not interfering with therapy as much  5/2- Wasn't getting topamax - just taking T#3- wil restart Topamax - don't know what occurred with order  5/6- HA's doing better  5/8- not requiring T#3 anymore lately per pt  5/13- Severe HA 5/11 in afternoon- took T#3  5/17 headache today does not sound severe, she has not taken her Tylenol  3 yet but nursing is getting it for her  5/18 headache improved with Tylenol  No. 3 today  5/20- refusing topamax - for past 3+ days- will go back and d/w pt  6/5- stopped Topamax - due ot side effects 18. Decreased safety awareness/impulsiveness  4/30- still requiring max cues  5/7- 5/10- no change-per therapy- still requiring max cues  5/23- Still requiring mod-max cues for safety  20. Spasticity  4/22- off baclofen , and c/o LUE pain- wondering how much of her complaints are due to tone?-   4/30- Spasticity improved in LUE- MAS of 0 actually this AM  5/11-12- Getting some spasticity vs disuse tightness inL hand- fingers esp 3rd/4th DIPs- might need Botox after d/c  5/14- MAS of 1 to 1+ in L wrist and fingers- not proximally  5/27- Spasticity  slightly worse this AM- esp when yawns- explained that's normal with spasticity due to increased pain  21. 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 .   4/30- pt pulled scab off-  22.  Urinary  and bowel incont due to CVA, continue toileting program 4/23- still cannot control B/B 4/30- on toileting program- is getting somewhat better, but still incontinent more at night  5/13- still incontinent at night  5/18 continued incontinence   23. 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   4/29- will get Dopplers, because pt says she elevated hand overnight- still swollen- ordered  4/30- Dopplers (-)- will get isotoner compression glove  5/4 received glove  5/6- Swelling ~ 50% better- will increase Duloxetine  to 120 mg daily for nerve pain in L hand and mood.   5/7- hand sore this AM- but not "killing her".   5/8- pain great this AM- doesn't need meds other than tylenol  per pt -sounds like  increased cymbalta  has helped  5/9- Some pain in L hand this Am, but hasn't taken tylenol  or T#3 yet this Am- 5/13- Will decrease Lyrica  to 50 mg q6pm since caused her to have visual changes early in day  5/14- Pain doing better in last 12+ hours- not as painful  24. C/O catch in breath- no SOB  5/9- asked pt to use ICS every 1 hour while awake- since probably is atelectasis   25. Dry eyes continue  eye drops as needed for pt  26. Gout? Swelling of L hand  5/13- pt has never had gout but runs in her family- she wants to try something else ot see if would help L hand- will try Colchicine  0.6 mg daily x 4 days as well as  Prednisone  20 mg daily x 4 days-  5/14- pt feels pain and swelling are better- not sure if due to gout, or just prednisone  vs power of suggestion- will con't to monitor  5/15- pt feels swelling and pain is better- maybe it is gout? Vs overall improvement with prednisone   27. Vaginal candidiasis  5/15- pt reports Sx's- will give Diflucan  150 mg x1   5/22- no more complaints  28. Worsening urinary incontinence/frequency at night  Discussed that UA and UC are negative  - 5-31, 6/1: Mostly continent except at nighttime.  Monitor. 29. Sedation with Melatonin  6/5- will reduce to 3 mg at bedtime and change to 8pm    LOS: 74 days A FACE TO FACE EVALUATION WAS PERFORMED  Theresa Watkins 04/23/2024, 9:13 AM

## 2024-04-23 NOTE — Progress Notes (Signed)
 Physical Therapy Session Note  Patient Details  Name: Theresa Watkins MRN: [id number] Date of Birth: 10/04/1966  Today's Date: 04/23/2024 PT Individual Time: 1020-1045 PT Individual Time Calculation (min): 25 min   Short Term Goals: Week 9: PT Short Term Goal 1 (Week 9): Pt will ambulate with RW and min a >100 ft PT Short Term Goal 2 (Week 9): Pt will navigate stairs with min a  Skilled Therapeutic Interventions/Progress Updates:    Pt presents in room in bed, asleep requires increased time to awaken. Pt denies pain. Session focused on therapeutic activities for facilitating self care tasks and household mobility as well as standing balance for self care tasks. Pt completes bed mobility with min assist, requires max assist for donning brief and pants sitting EOB, total assist for donning shoes. Pt completes sit to stand with mod assist with pt demonstrating excessive L lateral lean, requires max assist for managing pants over hips. Pt ambualtes with RW to bathroom with mod assist and max cues for correcting L lateral lean in standing, cues for RW managemnet and positioning prior to sitting on toilet. Pt requires min/mod assist for standing balance for managing clothing prior to toilet transfer. Pt continent of bladder void, charted. Pt completes periarea hygiene with supervision, completes stand with min assist and min/mod assist for standing balance for managing clothing over hips with cues for upright posture and correcting L lateral lean. Pt ambulates with RW with min assist to sink to complete hand hygiene with max verbal cues for increasing proximity to sink to complete hand hygiene in standing with pt demonstrating excessive trunk flexion and resting elbows on sink to complete task. Pt then attempts to ambulate to door however with mod assist and pt requesting to sit. Pt then completes WC mobility 27' with BLEs with mod assist  and max cues for attention to task as pt highly distractible in  hallway completed for BLE strengthening and improving activity tolerance and independence with mobility. Pt returns to room and completes stand step transfer without device with mod assist, cues for posture and foot placement, completes sit to supine with supervision with therapist providing max assist to adjust positioning however pt requesting to remain with all four bed rails elevated and sitting off center in bed. Pt remains with all needs within reach, cal light in place and bed alarm activated at end of session. Telesitter in place.   Therapy Documentation Precautions:  Precautions Precautions: Fall Precaution/Restrictions Comments: L hemi Restrictions Weight Bearing Restrictions Per Provider Order: No RUE Weight Bearing Per Provider Order: Partial weight bearing   Therapy/Group: Individual Therapy  Annia Kilts PT, DPT 04/23/2024, 1:42 PM

## 2024-04-24 DIAGNOSIS — I63511 Cerebral infarction due to unspecified occlusion or stenosis of right middle cerebral artery: Secondary | ICD-10-CM | POA: Diagnosis not present

## 2024-04-24 MED ORDER — METHOCARBAMOL 500 MG PO TABS
500.0000 mg | ORAL_TABLET | Freq: Three times a day (TID) | ORAL | Status: DC | PRN
  Administered 2024-04-24 – 2024-04-28 (×3): 500 mg via ORAL
  Filled 2024-04-24 (×4): qty 1

## 2024-04-24 NOTE — Progress Notes (Signed)
 PROGRESS NOTE  Good appetite this am but now tired, no HA pain ROS- - BP, SOB, No bowel or bladder problems      Objective:   No results found.   No results for input(s): "WBC", "HGB", "HCT", "PLT" in the last 72 hours.   No results for input(s): "NA", "K", "CL", "CO2", "GLUCOSE", "BUN", "CREATININE", "CALCIUM " in the last 72 hours.   No intake or output data in the 24 hours ending 04/24/24 1000     Physical Exam: Vital Signs Blood pressure 112/73, pulse 80, temperature 98.2 F (36.8 C), temperature source Oral, resp. rate 18, height 5\' 6"  (1.676 m), weight 72.3 kg, SpO2 91%.  General: No acute distress Mood and affect are appropriate Heart: Regular rate and rhythm no rubs murmurs or extra sounds Lungs: Clear to auscultation, breathing unlabored, no rales or wheezes Abdomen: Positive bowel sounds, soft nontender to palpation, nondistended Extremities: No clubbing, cyanosis, or edema Skin: No evidence of breakdown, no evidence of rash Neurologic: Cranial nerves II through XII intact, motor strength is 4+/5 in RIght and 4- left deltoid, bicep, tricep, grip, hip flexor, knee extensors, ankle dorsiflexor and plantar flexor   Musculoskeletal: Full range of motion in all 4 extremities. No joint swelling    Physical exam unchanged from the above on reexamination 04/24/24    Assessment/Plan: 1. Functional deficits which require 3+ hours per day of interdisciplinary therapy in a comprehensive inpatient rehab setting. Physiatrist is providing close team supervision and 24 hour management of active medical problems listed below. Physiatrist and rehab team continue to assess barriers to discharge/monitor patient progress toward functional and medical goals  Care Tool:  Bathing    Body parts bathed by patient: Left arm, Chest, Abdomen, Front perineal area, Buttocks, Right upper leg, Left upper leg, Right lower leg, Left  lower leg, Face   Body parts bathed by helper: Right arm Body parts n/a: Right arm   Bathing assist Assist Level: Minimal Assistance - Patient > 75%     Upper Body Dressing/Undressing Upper body dressing   What is the patient wearing?: Pull over shirt    Upper body assist Assist Level: Supervision/Verbal cueing    Lower Body Dressing/Undressing Lower body dressing      What is the patient wearing?: Pants, Underwear/pull up     Lower body assist Assist for lower body dressing: Minimal Assistance - Patient > 75%     Toileting Toileting    Toileting assist Assist for toileting: Minimal Assistance - Patient > 75%     Transfers Chair/bed transfer  Transfers assist     Chair/bed transfer assist level: Contact Guard/Touching assist     Locomotion Ambulation   Ambulation assist      Assist level: Minimal Assistance - Patient > 75% Assistive device: Walker-rolling Max distance: 90   Walk 10 feet activity   Assist     Assist level: Minimal Assistance - Patient > 75% Assistive device: Walker-hemi   Walk 50 feet activity   Assist Walk 50 feet with 2 turns activity did not occur: Safety/medical concerns (L hemiplegia and pt fatigue)  Assist level: Minimal Assistance - Patient > 75% Assistive device: [employer]  Walk 150 feet activity   Assist Walk 150 feet activity did not occur: Safety/medical concerns (L hemiplegia and pt fatigue)  Assist level: Minimal Assistance - Patient > 75% Assistive device: Walker-hemi    Walk 10 feet on uneven surface  activity   Assist Walk 10 feet on uneven surfaces activity did not occur: Safety/medical concerns (L hemiplegia and pt fatigue)         Wheelchair     Assist Is the patient using a wheelchair?: Yes Type of Wheelchair: Manual    Wheelchair assist level: Supervision/Verbal cueing Max wheelchair distance: 150    Wheelchair 50 feet with 2 turns activity    Assist        Assist  Level: Supervision/Verbal cueing   Wheelchair 150 feet activity     Assist      Assist Level: Supervision/Verbal cueing   Blood pressure 112/73, pulse 80, temperature 98.2 F (36.8 C), temperature source Oral, resp. rate 18, height 5\' 6"  (1.676 m), weight 72.3 kg, SpO2 91%.   Medical Problem List and Plan: 1. 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 Search for facility ongoing. May only be able to go to custodial care facility  D/c SNF-  Con't CIR 1x/day  Have found location  of SNF< but awaiting insurance approval- pt agreed.   2. DVT/anticoagulation:  Pharmaceutical: Lovenox              -antiplatelet therapy: continue Aspirin  81 mg daily and Plavix  75 mg daily x 3 weeks then Plavix  alone  3. Pain Management: continue Tylenol  as needed  4/3- having new burning pain in LUE- which is seen sometimes with stroke- will add Duloxetine  30 mg daily for nerve pain- monitor for side effects.  -if gets nausea, will change to Lyrica  4/4-6 no side effects so far-  4/8- will increase Duloxetine  to 60 mg daily  DC hydrocodone  due to side effects, trial tyl #3  as needed for pain that is not relieved by Tylenol . 4/28- advised pt to use voltaren  gel  on all sides of L hand including between fingers- use tylenol  #3 as needed 5/1- pt said using voltaren  gel and tylenol  #3 as needed- adding lidoderm  patches 2 patches at night for back pain 5/2- HA's- will restart SCHEDULED Topamax  75 mg at bedtime- not sure what occurred? Needs to get nightly 5/11- Add Lyrica  50 mg BID for nerve pain in L hand 5/13- changed to q6pm since pt feels it affected her vision-made it worse 5/14- said pain better today- not clear if due to prednisone  vs  Lyrica ? 5/15 pain much better in L hand-  pt feels it's gout? It might be 5/16- hand pain slightly better with ACE wrap overnight- which is fine 5/20- pt reports having migraines every AM, but refusing topamax  last few days- at least 3- will go back and d/w pt and NOT make changes as we discussed 5/21- will reduce Topamax  to 25 mg at bedtime to help with feelinga little confused 5/22- Will stop Topamax  since made her so "loopy"- pt doesn't want any other meds to try to help HA's- said will take Tylenol  5/26- Still having migraines intermittently 5/27- L hand  and wrist pain- radiates to L shoulder- no deformity- but holds hand in vulcan gesture at rest- xrays pending 5/29- 5/30- hand swelling and pain almost back to baseline 5-31: Last use of as needed Tylenol  with codeine  5-29, patient wishes to keep on board.  Defer to primary team 6/5- will con't tylenol  #3 as needed 4. Depression: continue duloxetine         5. Neuropsych/cognition: This patient is capable of making decisions on her own behalf. 6. Skin/Wound Care: Routine skin checks 7. Fluids/Electrolytes/Nutrition:encouraging po.   5/12- I personally reviewed the patient's labs today.     -encouraging PO 8.  Dysphagia.  Dysphagia 3 with thins currently  4/18 intake ok 5/11-5/13- advised pt to take smaller bites-  intake good, but takes too big of bites    5/14- switched back to full supervision- pt unhappy, but wasn't listening when I discussed more of how to eat/take bites 5/16- taking smaller bites with full supervision 5/18 eating 65 to 95% of her meals today 5/22- Back to full supervision- has to sit completely upright to eat 5/23- if this goes better, can go back to intermittent supervision once SLP decided 5-31: P.o. intakes appear adequate.  9.  Hyperlipidemia.  Continue Crestor  10.  Recent History of cocaine/tobacco use.  UDS positive for cocaine.  Provide counseling, this has been a chronic issue , seen in PCP notes 11.   Hypothyroidism. continue Synthroid   12.  Constipation.   Senokot-S 2 tab twice daily  5/6- LBM yesterday even though chart says 5/3  5/7- will add Miralax - esp since increased Duloxetine  which can cause constipation- already on Senna 2 tabs BID  5-31: Last bowel movement, medium  6/5- Last Bm 6/2- will d/w pt if no BM by tomorrow  15. Blurry vision and dysconjugate gaze  3/27- will need Ophtho after d/c.   4/4- pt reports everything blurry- cannot see faces as a result- never had eye checked in past- used readers but cannot see at a distance since stroke- will send to Ophtho after d/c.   4/7- d/w pt again- she wants eyeglasses- explained we cannot get when in hospital- also per guidelines, since vision can still change, to not get for 3 months after stroke. She disbelieves it's from stroke, since had 2 prior- that didn't cause vision changes- we educated pt pn how this can occur.   4/25- reminded pt cannot get Optho in hospital- need to get once d/c'd  4/30 went over visual changes with pt- will need to get glasses, likely prisms 2-3 months after d/c once vision stabilizes  5/6- d/w pt again- she wants to see Optho- explained they do not come into hospital for this  5/18 continued blurry vision, discussed outpatient f/u  5/23- pt having blurry vision again this AM, she says slightly worse- will con't to monitor  5-31: No complaints of blurry vision  16.  Left hip pain.  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 Severe HA-daily HA's  4/15-16 sx appear improved with topamax , tylenol   helps also   -  may use sx to get out of therapy at times?  4/23- Per team, pt having HA's frequently which impairs therapy- she won't discuss HA's with me- unclear if only has a day goes on vs self limting behavior  4/24- increase to 75 mg at bedtime  4/25- still having HA's, actually thinks a little worse since not taking Norco for it-   4/28-4/29 fewer complaints about HA this AM  4/30-5/1- - per staff, c/o these less- not interfering with therapy as much  5/2- Wasn't getting topamax - just taking T#3- wil restart Topamax - don't know what occurred with order  5/6- HA's doing better  5/8- not requiring T#3 anymore lately per pt  5/13- Severe HA 5/11 in afternoon- took T#3  5/17 headache today does not sound severe, she has not taken her Tylenol  3 yet but nursing is getting it for her  5/18 headache improved with Tylenol  No. 3 today  5/20- refusing topamax - for past 3+ days- will go back and d/w pt  6/5- stopped Topamax - due ot side effects Per LPN HA pain is improving  18. Decreased safety awareness/impulsiveness  4/30- still requiring max cues  5/7- 5/10- no change-per therapy- still requiring max cues  5/23- Still requiring mod-max cues for safety  20. Spasticity  4/22- off baclofen , and c/o LUE pain- wondering how much of her complaints are due to tone?-   4/30- Spasticity improved in LUE- MAS of 0 actually this AM  5/11-12- Getting some spasticity vs disuse tightness inL hand- fingers esp 3rd/4th DIPs- might need Botox after d/c  5/14- MAS of 1 to 1+ in L wrist and fingers- not proximally  5/27- Spasticity slightly worse this AM- esp when yawns- explained that's normal with spasticity due to increased pain  21. 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 .   4/30- pt pulled scab off-  22.  Urinary  and bowel incont due to CVA, continue toileting program 4/23- still  cannot control B/B 4/30- on toileting program- is getting somewhat better, but still incontinent more at night  5/13- still incontinent at night  5/18 continued incontinence   23. 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   4/29- will get Dopplers, because pt says she elevated hand overnight- still swollen- ordered  4/30- Dopplers (-)- will get isotoner compression glove  5/4 received glove  5/6- Swelling ~ 50% better- will increase Duloxetine  to 120 mg daily for nerve pain in L hand and mood.   5/7- hand sore this AM- but not "killing her".   5/8- pain great this AM- doesn't need meds other than tylenol  per pt -sounds like increased cymbalta  has helped  5/9- Some pain in L hand this Am, but hasn't taken tylenol  or T#3 yet this Am- 5/13- Will decrease Lyrica  to 50 mg q6pm since caused her to have visual changes early in day  5/14- Pain doing better in last 12+ hours- not as painful  24. C/O catch in breath- no SOB  5/9- asked pt to use ICS every 1 hour while awake- since probably is atelectasis  25. Dry eyes continue  eye drops as needed for pt  26. Gout? Swelling of L hand  5/13- pt has never had gout but runs in her family- she wants to try something else ot see if would help L hand- will try Colchicine  0.6 mg daily x 4 days as well as Prednisone  20 mg daily x 4 days-  5/14- pt feels pain and swelling are better- not sure if due to gout, or just prednisone  vs power of suggestion- will con't to monitor  5/15- pt feels swelling and pain is better- maybe it is gout? Vs overall improvement with prednisone   27. Vaginal candidiasis  5/15- pt reports Sx's- will give Diflucan  150 mg x1   5/22- no more complaints  28.  Worsening urinary incontinence/frequency at night  Discussed that UA and UC are negative  - 5-31, 6/1: Mostly continent except at nighttime.  Monitor. 29. Sedation with Melatonin  6/5- will reduce to 3 mg at bedtime and change to 8pm    LOS: 75 days A FACE TO FACE EVALUATION WAS PERFORMED  Cecilia Coe Pepper Kerrick 04/24/2024, 10:00 AM

## 2024-04-25 DIAGNOSIS — I63511 Cerebral infarction due to unspecified occlusion or stenosis of right middle cerebral artery: Secondary | ICD-10-CM | POA: Diagnosis not present

## 2024-04-25 NOTE — Progress Notes (Signed)
 PROGRESS NOTE Had whole body spasm yesterday reported by nursing,  Patient states that it was really just the left upper extremity left lower extremity.  Patient had good relief with Robaxin .  Had previously been on baclofen . ROS- - BP, SOB, No bowel or bladder problems      Objective:   No results found.   No results for input(s): "WBC", "HGB", "HCT", "PLT" in the last 72 hours.   No results for input(s): "NA", "K", "CL", "CO2", "GLUCOSE", "BUN", "CREATININE", "CALCIUM " in the last 72 hours.    Intake/Output Summary (Last 24 hours) at 04/25/2024 1058 Last data filed at 04/25/2024 0900 Gross per 24 hour  Intake 436 ml  Output --  Net 436 ml       Physical Exam: Vital Signs Blood pressure 110/73, pulse 75, temperature 98.5 F (36.9 C), temperature source Oral, resp. rate 18, height 5\' 6"  (1.676 m), weight 72.3 kg, SpO2 92%.  General: No acute distress Mood and affect are appropriate Heart: Regular rate and rhythm no rubs murmurs or extra sounds Lungs: Clear to auscultation, breathing unlabored, no rales or wheezes Abdomen: Positive bowel sounds, soft nontender to palpation, nondistended Extremities: No clubbing, cyanosis, or edema Skin: No evidence of breakdown, no evidence of rash Neurologic: Cranial nerves II through XII intact, motor strength is 4+/5 in RIght and 4- left deltoid, bicep, tricep, grip, hip flexor, knee extensors, ankle dorsiflexor and plantar flexor Tone 4 beats of clonus at the left ankle.  Ashworth score is 0 at the knee flexors and extensors Left upper extremity Ashworth 0  Musculoskeletal: Full range of motion in all 4 extremities. No joint swelling    Physical exam unchanged from the above on reexamination 04/25/24    Assessment/Plan: 1. Functional deficits which require 3+ hours per day of interdisciplinary therapy in a comprehensive inpatient rehab setting. Physiatrist is providing close  team supervision and 24 hour management of active medical problems listed below. Physiatrist and rehab team continue to assess barriers to discharge/monitor patient progress toward functional and medical goals  Care Tool:  Bathing    Body parts bathed by patient: Left arm, Chest, Abdomen, Front perineal area, Buttocks, Right upper leg, Left upper leg, Right lower leg, Left lower leg, Face   Body parts bathed by helper: Right arm Body parts n/a: Right arm   Bathing assist Assist Level: Minimal Assistance - Patient > 75%     Upper Body Dressing/Undressing Upper body dressing   What is the patient wearing?: Pull over shirt    Upper body assist Assist Level: Supervision/Verbal cueing    Lower Body Dressing/Undressing Lower body dressing      What is the patient wearing?: Pants, Underwear/pull up     Lower body assist Assist for lower body dressing: Minimal Assistance - Patient > 75%     Toileting Toileting    Toileting assist Assist for toileting: Minimal Assistance - Patient > 75%     Transfers Chair/bed transfer  Transfers assist     Chair/bed transfer assist level: Contact Guard/Touching assist     Locomotion Ambulation   Ambulation assist      Assist level: Minimal Assistance - Patient > 75% Assistive  device: Walker-rolling Max distance: 90   Walk 10 feet activity   Assist     Assist level: Minimal Assistance - Patient > 75% Assistive device: Walker-hemi   Walk 50 feet activity   Assist Walk 50 feet with 2 turns activity did not occur: Safety/medical concerns (L hemiplegia and pt fatigue)  Assist level: Minimal Assistance - Patient > 75% Assistive device: Walker-hemi    Walk 150 feet activity   Assist Walk 150 feet activity did not occur: Safety/medical concerns (L hemiplegia and pt fatigue)  Assist level: Minimal Assistance - Patient > 75% Assistive device: Walker-hemi    Walk 10 feet on uneven surface  activity   Assist Walk  10 feet on uneven surfaces activity did not occur: Safety/medical concerns (L hemiplegia and pt fatigue)         Wheelchair     Assist Is the patient using a wheelchair?: Yes Type of Wheelchair: Manual    Wheelchair assist level: Supervision/Verbal cueing Max wheelchair distance: 150    Wheelchair 50 feet with 2 turns activity    Assist        Assist Level: Supervision/Verbal cueing   Wheelchair 150 feet activity     Assist      Assist Level: Supervision/Verbal cueing   Blood pressure 110/73, pulse 75, temperature 98.5 F (36.9 C), temperature source Oral, resp. rate 18, height 5\' 6"  (1.676 m), weight 72.3 kg, SpO2 92%.   Medical Problem List and Plan: 1. 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 Search for facility ongoing. May only be able to go to custodial care facility  D/c SNF-  Con't CIR 1x/day  Have found location  of SNF< but awaiting insurance approval- pt agreed.   2. DVT/anticoagulation:  Pharmaceutical: Lovenox              -antiplatelet therapy: continue Aspirin  81 mg daily and Plavix  75 mg daily x 3 weeks then Plavix  alone  3. Pain Management: continue Tylenol  as needed  4/3- having new burning pain in LUE- which is seen sometimes with stroke- will add Duloxetine  30 mg daily for nerve pain- monitor for side effects.  -if gets nausea, will change to Lyrica  4/4-6 no side effects so far-  4/8- will increase Duloxetine  to 60 mg daily  DC hydrocodone  due to side effects, trial tyl #3  as needed for pain that is not relieved by Tylenol . 4/28- advised pt to use voltaren  gel  on all sides of L hand including between fingers- use tylenol  #3 as needed 5/1- pt said using voltaren  gel and  tylenol  #3 as needed- adding lidoderm  patches 2 patches at night for back pain 5/2- HA's- will restart SCHEDULED Topamax  75 mg at bedtime- not sure what occurred? Needs to get nightly 5/11- Add Lyrica  50 mg BID for nerve pain in L hand 5/13- changed to q6pm since pt feels it affected her vision-made it worse 5/14- said pain better today- not clear if due to prednisone  vs Lyrica ? 5/15 pain much better in L hand-  pt feels it's gout? It might be 5/16- hand pain slightly better with ACE wrap overnight- which is fine 5/20- pt reports having migraines every  AM, but refusing topamax  last few days- at least 3- will go back and d/w pt and NOT make changes as we discussed 5/21- will reduce Topamax  to 25 mg at bedtime to help with feelinga little confused 5/22- Will stop Topamax  since made her so "loopy"- pt doesn't want any other meds to try to help HA's- said will take Tylenol  5/26- Still having migraines intermittently 5/27- L hand and wrist pain- radiates to L shoulder- no deformity- but holds hand in vulcan gesture at rest- xrays pending 5/29- 5/30- hand swelling and pain almost back to baseline 5-31: Last use of as needed Tylenol  with codeine  5-29, patient wishes to keep on board.  Defer to primary team 6/5- will con't tylenol  #3 as needed 4. Depression: continue duloxetine         5. Neuropsych/cognition: This patient is capable of making decisions on her own behalf. 6. Skin/Wound Care: Routine skin checks 7. Fluids/Electrolytes/Nutrition:encouraging po.   5/12- I personally reviewed the patient's labs today.     -encouraging PO 8.  Dysphagia.  Dysphagia 3 with thins currently  4/18 intake ok 5/11-5/13- advised pt to take smaller bites-  intake good, but takes too big of bites    5/14- switched back to full supervision- pt unhappy, but wasn't listening when I discussed more of how to eat/take bites 5/16- taking smaller bites with full supervision 5/18 eating 65 to 95% of her meals today 5/22-  Back to full supervision- has to sit completely upright to eat 5/23- if this goes better, can go back to intermittent supervision once SLP decided 5-31: P.o. intakes appear adequate.  9.  Hyperlipidemia.  Continue Crestor  10.  Recent History of cocaine/tobacco use.  UDS positive for cocaine.  Provide counseling, this has been a chronic issue , seen in PCP notes 11.  Hypothyroidism. continue Synthroid   12.  Constipation.   Senokot-S 2 tab twice daily  5/6- LBM yesterday even though chart says 5/3  5/7- will add Miralax - esp since increased Duloxetine  which can cause constipation- already on Senna 2 tabs BID  5-31: Last bowel movement, medium  6/5- Last Bm 6/2- will d/w pt if no BM by tomorrow  15. Blurry vision and dysconjugate gaze  3/27- will need Ophtho after d/c.   4/4- pt reports everything blurry- cannot see faces as a result- never had eye checked in past- used readers but cannot see at a distance since stroke- will send to Ophtho after d/c.   4/7- d/w pt again- she wants eyeglasses- explained we cannot get when in hospital- also per guidelines, since vision can still change, to not get for 3 months after stroke. She disbelieves it's from stroke, since had 2 prior- that didn't cause vision changes- we educated pt pn how this can occur.   4/25- reminded pt cannot get Optho in hospital- need to get once d/c'd  4/30 went over visual changes with pt- will need to get glasses, likely prisms 2-3 months after d/c once vision stabilizes  5/6- d/w pt again- she wants to see Optho- explained they do not come into hospital for this  5/18 continued blurry vision, discussed outpatient f/u  5/23- pt having blurry vision again this AM, she says slightly worse- will con't to monitor  5-31: No complaints of blurry vision  16.  Left hip pain.  -3-29 x-ray showing moderate degenerative joint disease of the hip; no fracture  -Continue Voltaren  gel to hip 4 times daily  -May benefit from steroid  injection as an  outpatient  3/31- started Vit D and Ca per pt request  - improved to an extent  4/24- Pt reports L hip still hurts when stands/works with therapy however don't want her on opiates regularly due to can impair sensorium and she also ready has issues  5/20- pt refusing voltaren  gel  17. Severe HA-daily HA's  4/15-16 sx appear improved with topamax , tylenol  helps also   -may use sx to get out of therapy at times?  4/23- Per team, pt having HA's frequently which impairs therapy- she won't discuss HA's with me- unclear if only has a day goes on vs self limting behavior  4/24- increase to 75 mg at bedtime  4/25- still having HA's, actually thinks a little worse since not taking Norco for it-   4/28-4/29 fewer complaints about HA this AM  4/30-5/1- - per staff, c/o these less- not interfering with therapy as much  5/2- Wasn't getting topamax - just taking T#3- wil restart Topamax - don't know what occurred with order  5/6- HA's doing better  5/8- not requiring T#3 anymore lately per pt  5/13- Severe HA 5/11 in afternoon- took T#3  5/17 headache today does not sound severe, she has not taken her Tylenol  3 yet but nursing is getting it for her  5/18 headache improved with Tylenol  No. 3 today  5/20- refusing topamax - for past 3+ days- will go back and d/w pt  6/5- stopped Topamax - due ot side effects Per LPN HA pain is improving  18. Decreased safety awareness/impulsiveness  4/30- still requiring max cues  5/7- 5/10- no change-per therapy- still requiring max cues  5/23- Still requiring mod-max cues for safety  20. Spasticity  4/22- off baclofen , and c/o LUE pain- wondering how much of her complaints are due to tone?-   4/30- Spasticity improved in LUE- MAS of 0 actually this AM  5/11-12- Getting some spasticity vs disuse tightness inL hand- fingers esp 3rd/4th DIPs- might need Botox after d/c  5/14- MAS of 1 to 1+ in L wrist and fingers- not proximally  5/27- Spasticity slightly  worse this AM- esp when yawns- explained that's normal with spasticity due to increased pain Left upper extremity left lower extremity spasms yesterday has been off baclofen  for >76mo , tried robaxin  received 2 doses since yesterday which was helpful. This sounds like spasticity however other than clonus at the left ankle tone looks okay today.  Seemed to respond to Robaxin  but baclofen  may be more appropriate in this situation.  At this point the problem appears to be self-limited and may not need this medication on a long-term basis 21. 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 .   4/30- pt pulled scab off-  22.  Urinary  and bowel incont due to CVA, continue toileting program 4/23- still cannot control B/B 4/30- on toileting program- is getting somewhat better, but still incontinent more at night  5/13- still incontinent at night  5/18 continued incontinence   23. 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   4/29- will get Dopplers, because pt says she elevated  hand overnight- still swollen- ordered  4/30- Dopplers (-)- will get isotoner compression glove  5/4 received glove  5/6- Swelling ~ 50% better- will increase Duloxetine  to 120 mg daily for nerve pain in L hand and mood.   5/7- hand sore this AM- but not "killing her".   5/8- pain great this AM- doesn't need meds other than tylenol  per pt -sounds like increased cymbalta  has helped  5/9- Some pain in L hand this Am, but hasn't taken tylenol  or T#3 yet this Am- 5/13- Will decrease Lyrica  to 50 mg q6pm since caused her to have visual changes early in day   5/14- Pain doing better in last 12+ hours- not as painful  24. C/O catch in breath- no SOB  5/9- asked pt to use ICS every 1 hour while awake- since probably is atelectasis   25. Dry eyes continue  eye drops as needed for pt  26. Gout? Swelling of L hand  5/13- pt has never had gout but runs in her family- she wants to try something else ot see if would help L hand- will try Colchicine  0.6 mg daily x 4 days as well as Prednisone  20 mg daily x 4 days-  5/14- pt feels pain and swelling are better- not sure if due to gout, or just prednisone  vs power of suggestion- will con't to monitor  5/15- pt feels swelling and pain is better- maybe it is gout? Vs overall improvement with prednisone   27. Vaginal candidiasis  5/15- pt reports Sx's- will give Diflucan  150 mg x1   5/22- no more complaints  28. Worsening urinary incontinence/frequency at night  Discussed that UA and UC are negative  - 5-31, 6/1: Mostly continent except at nighttime.  Monitor. 29. Sedation with Melatonin  6/5- will reduce to 3 mg at bedtime and change to 8pm    LOS: 76 days A FACE TO FACE EVALUATION WAS PERFORMED  Genetta Kenning 04/25/2024, 10:58 AM

## 2024-04-25 NOTE — Plan of Care (Signed)
  Problem: Consults Goal: RH STROKE PATIENT EDUCATION Description: See Patient Education module for education specifics  Outcome: Progressing   Problem: RH BOWEL ELIMINATION Goal: RH STG MANAGE BOWEL WITH ASSISTANCE Description: STG Manage Bowel with toileting Assistance. Outcome: Progressing Goal: RH STG MANAGE BOWEL W/MEDICATION W/ASSISTANCE Description: STG Manage Bowel with Medication with mod I  Assistance. Outcome: Progressing   Problem: RH SAFETY Goal: RH STG ADHERE TO SAFETY PRECAUTIONS W/ASSISTANCE/DEVICE Description: STG Adhere to Safety Precautions With  cues Assistance/Device. Outcome: Progressing

## 2024-04-26 ENCOUNTER — Other Ambulatory Visit (HOSPITAL_COMMUNITY): Payer: Self-pay

## 2024-04-26 DIAGNOSIS — I63511 Cerebral infarction due to unspecified occlusion or stenosis of right middle cerebral artery: Secondary | ICD-10-CM | POA: Diagnosis not present

## 2024-04-26 LAB — CBC WITH DIFFERENTIAL/PLATELET
Abs Immature Granulocytes: 0.03 10*3/uL (ref 0.00–0.07)
Basophils Absolute: 0 10*3/uL (ref 0.0–0.1)
Basophils Relative: 0 %
Eosinophils Absolute: 0.2 10*3/uL (ref 0.0–0.5)
Eosinophils Relative: 3 %
HCT: 45.3 % (ref 36.0–46.0)
Hemoglobin: 14.3 g/dL (ref 12.0–15.0)
Immature Granulocytes: 1 %
Lymphocytes Relative: 41 %
Lymphs Abs: 2.2 10*3/uL (ref 0.7–4.0)
MCH: 27.9 pg (ref 26.0–34.0)
MCHC: 31.6 g/dL (ref 30.0–36.0)
MCV: 88.5 fL (ref 80.0–100.0)
Monocytes Absolute: 0.5 10*3/uL (ref 0.1–1.0)
Monocytes Relative: 9 %
Neutro Abs: 2.4 10*3/uL (ref 1.7–7.7)
Neutrophils Relative %: 46 %
Platelets: 173 10*3/uL (ref 150–400)
RBC: 5.12 MIL/uL — ABNORMAL HIGH (ref 3.87–5.11)
RDW: 13.5 % (ref 11.5–15.5)
WBC: 5.3 10*3/uL (ref 4.0–10.5)
nRBC: 0 % (ref 0.0–0.2)

## 2024-04-26 MED ORDER — PREGABALIN 50 MG PO CAPS
50.0000 mg | ORAL_CAPSULE | Freq: Every day | ORAL | 0 refills | Status: AC
  Filled 2024-04-26: qty 30, 30d supply, fill #0

## 2024-04-26 MED ORDER — ACETAMINOPHEN 325 MG PO TABS: 650.0000 mg | ORAL_TABLET | ORAL | Status: AC | PRN

## 2024-04-26 MED ORDER — VITAMIN D3 25 MCG PO TABS
1000.0000 [IU] | ORAL_TABLET | Freq: Every day | ORAL | 0 refills | Status: AC
  Filled 2024-04-26: qty 30, 30d supply, fill #0

## 2024-04-26 MED ORDER — VITAMIN D3 25 MCG PO TABS: 1000.0000 [IU] | ORAL_TABLET | Freq: Every day | ORAL | Status: DC

## 2024-04-26 MED ORDER — LORATADINE 10 MG PO TABS: 10.0000 mg | ORAL_TABLET | Freq: Every day | ORAL | Status: DC

## 2024-04-26 MED ORDER — ACETAMINOPHEN-CODEINE 300-30 MG PO TABS: 1.0000 | ORAL_TABLET | Freq: Three times a day (TID) | ORAL | 0 refills | Status: DC | PRN

## 2024-04-26 MED ORDER — METHOCARBAMOL 500 MG PO TABS
500.0000 mg | ORAL_TABLET | Freq: Three times a day (TID) | ORAL | Status: DC | PRN
Start: 2024-04-26 — End: 2024-04-26

## 2024-04-26 MED ORDER — MELATONIN 3 MG PO TABS
3.0000 mg | ORAL_TABLET | Freq: Every day | ORAL | 0 refills | Status: AC
  Filled 2024-04-26: qty 30, 30d supply, fill #0

## 2024-04-26 MED ORDER — DULOXETINE HCL 60 MG PO CPEP
120.0000 mg | ORAL_CAPSULE | Freq: Every day | ORAL | 0 refills | Status: AC
  Filled 2024-04-26: qty 120, 60d supply, fill #0

## 2024-04-26 MED ORDER — METHOCARBAMOL 500 MG PO TABS
500.0000 mg | ORAL_TABLET | Freq: Three times a day (TID) | ORAL | 0 refills | Status: AC | PRN
  Filled 2024-04-26: qty 60, 20d supply, fill #0

## 2024-04-26 MED ORDER — CALCIUM CITRATE 950 (200 CA) MG PO TABS
200.0000 mg | ORAL_TABLET | Freq: Every day | ORAL | 0 refills | Status: AC
  Filled 2024-04-26: qty 30, 30d supply, fill #0

## 2024-04-26 MED ORDER — ROSUVASTATIN CALCIUM 40 MG PO TABS
40.0000 mg | ORAL_TABLET | Freq: Every day | ORAL | 0 refills | Status: AC
  Filled 2024-04-26: qty 30, 30d supply, fill #0

## 2024-04-26 MED ORDER — LIDOCAINE 5 % EX PTCH
2.0000 | MEDICATED_PATCH | CUTANEOUS | 0 refills | Status: AC
  Filled 2024-04-26: qty 30, 15d supply, fill #0

## 2024-04-26 MED ORDER — MELATONIN 3 MG PO TABS
3.0000 mg | ORAL_TABLET | Freq: Every day | ORAL | Status: DC
Start: 2024-04-26 — End: 2024-04-26

## 2024-04-26 MED ORDER — DICLOFENAC SODIUM 1 % EX GEL: 2.0000 g | Freq: Four times a day (QID) | CUTANEOUS | Status: DC | PRN

## 2024-04-26 MED ORDER — CALCIUM CITRATE 950 (200 CA) MG PO TABS
200.0000 mg | ORAL_TABLET | Freq: Every day | ORAL | Status: DC
Start: 2024-04-27 — End: 2024-04-26

## 2024-04-26 MED ORDER — DICLOFENAC SODIUM 1 % EX GEL
2.0000 g | Freq: Four times a day (QID) | CUTANEOUS | 0 refills | Status: AC | PRN
Start: 2024-04-26 — End: ?
  Filled 2024-04-26: qty 50, 5d supply, fill #0

## 2024-04-26 MED ORDER — LORATADINE 10 MG PO TABS
10.0000 mg | ORAL_TABLET | Freq: Every day | ORAL | 0 refills | Status: AC
Start: 2024-04-26 — End: ?
  Filled 2024-04-26: qty 30, 30d supply, fill #0

## 2024-04-26 MED ORDER — PREGABALIN 50 MG PO CAPS: 50.0000 mg | ORAL_CAPSULE | Freq: Every day | ORAL | 0 refills | Status: DC

## 2024-04-26 MED ORDER — LIDOCAINE 5 % EX PTCH
2.0000 | MEDICATED_PATCH | CUTANEOUS | 0 refills | Status: DC
Start: 2024-04-26 — End: 2024-04-26

## 2024-04-26 MED ORDER — ACETAMINOPHEN-CODEINE 300-30 MG PO TABS
1.0000 | ORAL_TABLET | Freq: Three times a day (TID) | ORAL | 0 refills | Status: DC | PRN
  Filled 2024-04-26: qty 15, 5d supply, fill #0

## 2024-04-26 MED ORDER — LEVOTHYROXINE SODIUM 100 MCG PO TABS
100.0000 ug | ORAL_TABLET | Freq: Every day | ORAL | 0 refills | Status: AC
  Filled 2024-04-26: qty 30, 30d supply, fill #0

## 2024-04-26 MED ORDER — DULOXETINE HCL 60 MG PO CPEP: 120.0000 mg | ORAL_CAPSULE | Freq: Every day | ORAL | 0 refills | Status: DC

## 2024-04-26 MED ORDER — CLOPIDOGREL BISULFATE 75 MG PO TABS
75.0000 mg | ORAL_TABLET | Freq: Every day | ORAL | 0 refills | Status: AC
Start: 2024-04-26 — End: ?
  Filled 2024-04-26: qty 30, 30d supply, fill #0

## 2024-04-26 NOTE — Progress Notes (Signed)
 Speech Language Pathology Daily Session Note  Patient Details  Name: Theresa Watkins MRN: [id number] Date of Birth: 1965-12-26  Today's Date: 04/26/2024 SLP Individual Time: 1130-1200 SLP Individual Time Calculation (min): 30 min  Short Term Goals: Week 8: SLP Short Term Goal 1 (Week 8): (Week 11) Patient will communicate complex information with 90% intelligibility and mod iA SLP Short Term Goal 1 - Progress (Week 8): Other (comment) SLP Short Term Goal 2 (Week 8): (Week 11) Patient will utilize safe swallowing strategies during consumption of regular/thin diet with mod i multimodal A SLP Short Term Goal 2 - Progress (Week 8): Other (comment)  Skilled Therapeutic Interventions: Skilled therapy session focused on communication goals. SLP facilitated session by prompting patient to complete x25 repetitions of EMST set at 40cm H2O. Patient completed with supervisionA. SLP then reviewed speech intelligibility strategies and encouraged use at the conversational level. This date, patient reached 95% intelligibility at the structured and unstructured conversational level. Patient left in bed with call bell in reach. Continue POC.  Pain None reported   Therapy/Group: Individual Therapy  Jp Eastham M.A., CCC-SLP 04/26/2024, 7:50 AM

## 2024-04-26 NOTE — Progress Notes (Signed)
 Occupational Therapy Session Note  Patient Details  Name: Theresa Watkins MRN: [id number] Date of Birth: 02-10-1966  Today's Date: 04/26/2024 OT Individual Time: 0930-1000 OT Individual Time Calculation (min): 30 min    Short Term Goals: Week 10: OT Short Term Goal 1 (Week 10): Pt will perform toileting tasks with CGA OT Short Term Goal 2 (Week 10): Pt will use LUE as gross assist with min A during functional tasks   Skilled Therapeutic Interventions/Progress Updates:    Skilled OT intervention with focus on functional transfers, toileting, dressing, and safety awareness to increase independence with BADLs. Toilet transfer with min A/CGA using grab bar. Pt required partial assistance with pulling up pants completely. Hygiene with supervision. Pt returned to room and transferred to EOB with close supervision. Pt able to doff clothing and don night gown with supervision. Continued safety education. Pt verbalized understanding. Pt remained in bed with all needs within reach. Bed alarm activated.   Therapy Documentation Precautions:  Precautions Precautions: Fall Precaution/Restrictions Comments: L hemi Restrictions Weight Bearing Restrictions Per Provider Order: No RUE Weight Bearing Per Provider Order: Partial weight bearing   Pain: Pt denies pain upon arrival.   Therapy/Group: Individual Therapy  [name] 04/26/2024, 11:33 AM

## 2024-04-26 NOTE — Progress Notes (Signed)
 Patient ID: TAVARIA MACKINS, female   DOB: May 02, 1966, 58 y.o.   MRN: [id number]   SW received updates from [name]/liaison with [facility] SNF reporting received insurance auth for placement. MD cleared for discharge tomorrow.   SW met with pt in room to discus above. Pt reports she was not going to [facility], and understands alternate option is to discharge to home as her current stay is not being covered by insurance. Pt reports that she will discharge to home.   1122- SW spoke with pt son Vonni to discuss above. He reports they spoke with Aleene Amor at [facility] and they report there are three female beds available. SW shared a referral was sent to [facility] on 6/4 and it was declined, however, SW will follow-up.   1130- SW left message for Aleene Amor to discuss potential bed offer. SW called back to speak with Kristal/Admissions but she is out on leave. SW spoke with staff to share above conversation, and asked if they were able to reconsider bed offer. SW waiting on follow-up.   *SW wring on tentative HH to put in place in case pt were to discharge to home.   1420- SW received phone call from [place] with [facility] reporting they do not have a Medicaid bed available.   64- SW spoke with pt son Vonii to inform on above. They would like SW to explore [facility] and email Mr. Dian Formosa to discuss referral. SW sent referral to [facility] again, and emailed Mr. Dian Formosa ([email or web address]).  They prefer for her to not go to [place] because it is too far.   SW send referral in epic hub again and referral denied due to payor. SW spoke with administrator with [facility] and discussed situation in length about situation. Understands reason referral was declined.   1500- SW called pt son and waiting on follow-up.    Declined HHAs Amy[facility]- not in-network [facility]-not in network  Norval Been, MSW, LCSW Office: [phone] Cell:  [phone] Fax: [phone]

## 2024-04-26 NOTE — Progress Notes (Signed)
 Physical Therapy Weekly Progress Note  Patient Details  Name: Theresa Watkins MRN: [id number] Date of Birth: Mar 24, 1966  Beginning of progress report period: April 19, 2024 End of progress report period: April 26, 2024  Today's Date: 04/26/2024 PT Individual Time: 0840-0905 PT Individual Time Calculation (min): 25 min   Patient has met 1 of 2 short term goals.  Ms. Urquilla is making inconsistent progress toward LTGs. She is able to ambulate with RW on some dates with min a  but requires up to max a on other dates. Pt can perform floor transfers and bed/chair transfers vary from CGA to min a. She continues to require supervision for safety and has limited participation.   Patient continues to demonstrate the following deficits muscle weakness and muscle joint tightness, decreased cardiorespiratoy endurance, abnormal tone, decreased coordination, and decreased motor planning, and decreased sitting balance, decreased standing balance, decreased postural control, and decreased balance strategies and therefore will continue to benefit from skilled PT intervention to increase functional independence with mobility.  Patient progressing toward long term goals..  Continue plan of care.  PT Short Term Goals Week 9: PT Short Term Goal 1 (Week 9): Pt will ambulate with RW and min a >150 ft PT Short Term Goal 2 (Week 9): Pt will navigate stairs with min a  Skilled Therapeutic Interventions/Progress Updates:    Pt eating breakfast on arrival, asks to reschedule. On return to room, pt reports not feeling well and like she has a temperature. Pt's temp found to be 98.5 so pt agreeable to participate. Pt required increased time and bed rails for sup>sit, declines assist. Pt propels w/c with BLE 2 x 10-20 ft for endurance and functional mobility.  Note initial LLE inversion, but pt able to correct with time and weight bearing, did not present during gait. Pt ambulated ~120 ft with min a and +1 w/c follow. Assist for  weight shift and cues for safety with RW. Pt returned to room and remained in w/c to await OT, refused alarm belt at this time.   Therapy Documentation Precautions:  Precautions Precautions: Fall Precaution/Restrictions Comments: L hemi Restrictions Weight Bearing Restrictions Per Provider Order: No RUE Weight Bearing Per Provider Order: Partial weight bearing General:      Therapy/Group: Individual Therapy  Tex Filbert 04/26/2024, 8:47 AM

## 2024-04-26 NOTE — Progress Notes (Signed)
 PROGRESS NOTE   Pt reports had a bad body spasms this weekend- robaxin  helped- so likely not spasticity, just a muscle spasm? Although does have spasticity, robaxin  doesn't help spasticity. .   Also c/o numbness in B/L LE's-  and when stands, has some spasms-    ROS-   Pt denies SOB, abd pain, CP, N/V/C/D, and vision changes     Objective:   No results found.   Recent Labs    04/26/24 0526  WBC 5.3  HGB 14.3  HCT 45.3  PLT 173     No results for input(s): "NA", "K", "CL", "CO2", "GLUCOSE", "BUN", "CREATININE", "CALCIUM " in the last 72 hours.    Intake/Output Summary (Last 24 hours) at 04/26/2024 0901 Last data filed at 04/26/2024 0818 Gross per 24 hour  Intake 456 ml  Output --  Net 456 ml       Physical Exam: Vital Signs Blood pressure 105/71, pulse 81, temperature 98.4 F (36.9 C), temperature source Oral, resp. rate 18, height 5\' 6"  (1.676 m), weight 72.3 kg, SpO2 94%.     General: awake, alert, appropriate, supine in bed; NAD HENT: conjugate gaze; oropharynx moist CV: regular rate; no JVD Pulmonary: CTA B/L; no W/R/R- good air movement GI: soft, NT, ND, (+)BS Psychiatric: appropriate Neurological: Ox3- moderate dysarthria- but using more detailed words today-  Checked sensation in LE's- was intact on exam to light touch in L1-S2 dermatomes B/L Also has few beats of clonus on LLE- 3-4 beats and 2 beats on RLE, but couldn't replicate Skin: No evidence of breakdown, no evidence of rash Neurologic: Cranial nerves II through XII intact, motor strength is 4+/5 in RIght and 4- left deltoid, bicep, tricep, grip, hip flexor, knee extensors, ankle dorsiflexor and plantar flexor Tone 4 beats of clonus at the left ankle.  Ashworth score is 0 at the knee flexors and extensors Left upper extremity Ashworth 0  Musculoskeletal: Full range of motion in all 4 extremities. No joint swelling    Physical exam  unchanged from the above on reexamination 04/26/24    Assessment/Plan: 1. Functional deficits which require 3+ hours per day of interdisciplinary therapy in a comprehensive inpatient rehab setting. Physiatrist is providing close team supervision and 24 hour management of active medical problems listed below. Physiatrist and rehab team continue to assess barriers to discharge/monitor patient progress toward functional and medical goals  Care Tool:  Bathing    Body parts bathed by patient: Left arm, Chest, Abdomen, Front perineal area, Buttocks, Right upper leg, Left upper leg, Right lower leg, Left lower leg, Face   Body parts bathed by helper: Right arm Body parts n/a: Right arm   Bathing assist Assist Level: Minimal Assistance - Patient > 75%     Upper Body Dressing/Undressing Upper body dressing   What is the patient wearing?: Pull over shirt    Upper body assist Assist Level: Supervision/Verbal cueing    Lower Body Dressing/Undressing Lower body dressing      What is the patient wearing?: Pants, Underwear/pull up     Lower body assist Assist for lower body dressing: Minimal Assistance - Patient > 75%     Toileting Toileting  Toileting assist Assist for toileting: Minimal Assistance - Patient > 75%     Transfers Chair/bed transfer  Transfers assist     Chair/bed transfer assist level: Contact Guard/Touching assist     Locomotion Ambulation   Ambulation assist      Assist level: Minimal Assistance - Patient > 75% Assistive device: Walker-rolling Max distance: 90   Walk 10 feet activity   Assist     Assist level: Minimal Assistance - Patient > 75% Assistive device: Walker-hemi   Walk 50 feet activity   Assist Walk 50 feet with 2 turns activity did not occur: Safety/medical concerns (L hemiplegia and pt fatigue)  Assist level: Minimal Assistance - Patient > 75% Assistive device: Walker-hemi    Walk 150 feet activity   Assist Walk 150  feet activity did not occur: Safety/medical concerns (L hemiplegia and pt fatigue)  Assist level: Minimal Assistance - Patient > 75% Assistive device: Walker-hemi    Walk 10 feet on uneven surface  activity   Assist Walk 10 feet on uneven surfaces activity did not occur: Safety/medical concerns (L hemiplegia and pt fatigue)         Wheelchair     Assist Is the patient using a wheelchair?: Yes Type of Wheelchair: Manual    Wheelchair assist level: Supervision/Verbal cueing Max wheelchair distance: 150    Wheelchair 50 feet with 2 turns activity    Assist        Assist Level: Supervision/Verbal cueing   Wheelchair 150 feet activity     Assist      Assist Level: Supervision/Verbal cueing   Blood pressure 105/71, pulse 81, temperature 98.4 F (36.9 C), temperature source Oral, resp. rate 18, height 5\' 6"  (1.676 m), weight 72.3 kg, SpO2 94%.   Medical Problem List and Plan: 1. 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 Search for facility ongoing. May only be able to go to custodial care facility  D/c SNF-  Con't therapies 1x/day  Have found location  of SNF< but awaiting insurance approval- pt agreed.   2. DVT/anticoagulation:  Pharmaceutical: Lovenox              -antiplatelet therapy: continue Aspirin  81 mg daily and Plavix  75 mg daily x 3 weeks then Plavix  alone  3. Pain Management: continue Tylenol  as needed  4/3- having new burning pain in LUE- which is seen sometimes with stroke- will add Duloxetine  30 mg daily for nerve pain- monitor for side effects.  -if gets nausea, will change to Lyrica  4/4-6 no side effects so far-  4/8- will increase Duloxetine  to 60 mg daily  DC hydrocodone   due to side effects, trial tyl #3  as needed for pain that is not relieved by Tylenol . 4/28- advised pt to use voltaren  gel  on all sides of L hand including between fingers- use tylenol  #3 as needed 5/1- pt said using voltaren  gel and tylenol  #3 as needed- adding lidoderm  patches 2 patches at night for back pain 5/2- HA's- will restart SCHEDULED Topamax  75 mg at bedtime- not sure what occurred? Needs to get nightly 5/11- Add Lyrica  50 mg BID for nerve pain in L hand 5/13- changed to  q6pm since pt feels it affected her vision-made it worse 5/14- said pain better today- not clear if due to prednisone  vs Lyrica ? 5/15 pain much better in L hand-  pt feels it's gout? It might be 5/16- hand pain slightly better with ACE wrap overnight- which is fine 5/20- pt reports having migraines every AM, but refusing topamax  last few days- at least 3- will go back and d/w pt and NOT make changes as we discussed 5/21- will reduce Topamax  to 25 mg at bedtime to help with feelinga little confused 5/22- Will stop Topamax  since made her so "loopy"- pt doesn't want any other meds to try to help HA's- said will take Tylenol  5/26- Still having migraines intermittently 5/27- L hand and wrist pain- radiates to L shoulder- no deformity- but holds hand in vulcan gesture at rest- xrays pending 5/29- 5/30- hand swelling and pain almost back to baseline 5-31: Last use of as needed Tylenol  with codeine  5-29, patient wishes to keep on board.  Defer to primary team 6/5- will con't tylenol  #3 as needed 4. Depression: continue duloxetine         5. Neuropsych/cognition: This patient is capable of making decisions on her own behalf. 6. Skin/Wound Care: Routine skin checks 7. Fluids/Electrolytes/Nutrition:encouraging po.   5/12- I personally reviewed the patient's labs today.     -encouraging PO 8.  Dysphagia.  Dysphagia 3 with thins currently  4/18 intake ok 5/11-5/13- advised pt to take smaller bites-  intake good, but takes  too big of bites    5/14- switched back to full supervision- pt unhappy, but wasn't listening when I discussed more of how to eat/take bites 5/16- taking smaller bites with full supervision 5/18 eating 65 to 95% of her meals today 5/22- Back to full supervision- has to sit completely upright to eat 5/23- if this goes better, can go back to intermittent supervision once SLP decided 5-31: P.o. intakes appear adequate.  9.  Hyperlipidemia.  Continue Crestor  10.  Recent History of cocaine/tobacco use.  UDS positive for cocaine.  Provide counseling, this has been a chronic issue , seen in PCP notes 11.  Hypothyroidism. continue Synthroid   12.  Constipation.   Senokot-S 2 tab twice daily  5/6- LBM yesterday even though chart says 5/3  5/7- will add Miralax - esp since increased Duloxetine  which can cause constipation- already on Senna 2 tabs BID  5-31: Last bowel movement, medium  6/5- Last Bm 6/2- will d/w pt if no BM by tomorrow  6/9- LBM 6/8 15. Blurry vision and dysconjugate gaze  3/27- will need Ophtho after d/c.   4/4- pt reports everything blurry- cannot see faces as a result- never had eye checked in past- used readers but cannot see at a distance since stroke- will send to Ophtho after d/c.   4/7- d/w pt again- she wants eyeglasses- explained we cannot get when in hospital- also per guidelines, since vision can still change, to not get for 3 months after stroke. She disbelieves it's from stroke, since had 2 prior- that didn't cause vision changes- we educated pt pn how this can occur.   4/25- reminded pt cannot get Optho in hospital- need to get once d/c'd  4/30 went over visual changes with pt- will need to get glasses, likely prisms 2-3 months after d/c once vision stabilizes  5/6- d/w pt again- she wants to see Optho- explained they do not come into hospital for this  5/18 continued blurry vision, discussed outpatient f/u  5/23-  pt having blurry vision again this AM, she says slightly  worse- will con't to monitor  5-31: No complaints of blurry vision  16.  Left hip pain.  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 Severe HA-daily HA's  4/15-16 sx appear improved with topamax , tylenol  helps also   -may use sx to get out of therapy at times?  4/23- Per team, pt having HA's frequently which impairs therapy- she won't discuss HA's with me- unclear if only has a day goes on vs self limting behavior  4/24- increase to 75 mg at bedtime  4/25- still having HA's, actually thinks a little worse since not taking Norco for it-   4/28-4/29 fewer complaints about HA this AM  4/30-5/1- - per staff, c/o these less- not interfering with therapy as much  5/2- Wasn't getting topamax - just taking T#3- wil restart Topamax - don't know what occurred with order  5/6- HA's doing better  5/8- not requiring T#3 anymore lately per pt  5/13- Severe HA 5/11 in afternoon- took T#3  5/17 headache today does not sound severe, she has not taken her Tylenol  3 yet but nursing is getting it for her  5/18 headache improved with Tylenol  No. 3 today  5/20- refusing topamax - for past 3+ days- will go back and d/w pt  6/5- stopped Topamax - due ot side effects Per LPN HA pain is improving  18. Decreased safety awareness/impulsiveness  4/30- still requiring max cues  5/7- 5/10- no change-per therapy- still requiring max cues  5/23- Still requiring mod-max cues for safety  20. Spasticity  4/22- off baclofen , and c/o LUE pain- wondering how much of her complaints are due to tone?-   4/30- Spasticity  improved in LUE- MAS of 0 actually this AM  5/11-12- Getting some spasticity vs disuse tightness inL hand- fingers esp 3rd/4th DIPs- might need Botox after d/c  5/14- MAS of 1 to 1+ in L wrist and fingers- not proximally  5/27- Spasticity slightly worse this AM- esp when yawns- explained that's normal with spasticity due to increased pain Left upper extremity left lower extremity spasms yesterday has been off baclofen  for >8mo , tried robaxin  received 2 doses since yesterday which was helpful. This sounds like spasticity however other than clonus at the left ankle tone looks okay today.  Seemed to respond to Robaxin  but baclofen  may be more appropriate in this situation.  At this point the problem appears to be self-limited and may not need this medication on a long-term basis  6/9- I agree with above- hopefully we don't need to start a spasticity agent 21. 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 .   4/30- pt pulled scab off-  22.  Urinary  and bowel incont due to CVA, continue toileting program 4/23- still cannot control B/B 4/30- on toileting program- is getting somewhat better, but still incontinent more at night  5/13- still incontinent at night  5/18 continued incontinence   23. 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   4/29- will get Dopplers, because pt says she elevated hand overnight- still swollen- ordered  4/30- Dopplers (-)- will get isotoner compression glove  5/4 received glove  5/6- Swelling ~ 50% better- will increase Duloxetine  to 120 mg daily for nerve pain in L hand and mood.   5/7- hand  sore this AM- but not "killing her".   5/8- pain great this AM- doesn't need meds other than tylenol  per pt -sounds like increased cymbalta  has helped  5/9- Some pain in L hand this Am, but hasn't taken tylenol  or T#3 yet this Am- 5/13- Will decrease Lyrica  to 50 mg q6pm since caused her to have visual changes early in day  5/14- Pain doing better in last 12+ hours- not as painful  24. C/O catch in breath- no SOB  5/9- asked pt to use ICS every 1 hour while awake- since probably is atelectasis   25. Dry eyes continue  eye drops as needed for pt  26. Gout? Swelling of L hand  5/13- pt has never had gout but runs in her family- she wants to try something else ot see if would help L hand- will try Colchicine  0.6 mg daily x 4 days as well as Prednisone  20 mg daily x 4 days-  5/14- pt feels pain and swelling are better- not sure if due to gout, or just prednisone  vs power of suggestion- will con't to monitor  5/15- pt feels swelling and pain is better- maybe it is gout? Vs overall improvement with prednisone   27. Vaginal candidiasis  5/15- pt reports Sx's- will give Diflucan  150 mg x1   5/22- no more complaints  28. Worsening urinary incontinence/frequency at night  Discussed that UA and UC are negative  - 5-31, 6/1: Mostly continent except at nighttime.  Monitor. 29. Sedation with Melatonin  6/5- will reduce to 3 mg at bedtime and change to 8pm    LOS: 77 days A FACE TO FACE EVALUATION WAS PERFORMED  Hayzen Lorenson 04/26/2024, 9:01 AM

## 2024-04-26 NOTE — NC FL2 (Signed)
 [place]  MEDICAID FL2 LEVEL OF CARE FORM     IDENTIFICATION  Patient Name: Theresa Watkins Birthdate: November 01, 1966 Sex: female Admission Date (Current Location): 02/09/2024  [place] and [place] Number:  [name] [id number] Facility and Address:  [facility], 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   TIA (transient ischemic attack) 02/03/2024   Hypothyroidism 02/03/2024   Prediabetes 12/12/2022   Acute CVA (cerebrovascular accident) (HCC) 11/10/2022   Acute ischemic stroke (HCC) 11/09/2022   Chondromalacia patellae, 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   Unilateral primary osteoarthritis, left hip 09/25/2021   Left foot pain 08/26/2019   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   Tobacco dependence 04/09/2019   Vitamin D  deficiency 02/18/2018   Toe deformity, acquired, right 02/18/2018   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   Hyperlipemia 03/21/2017   Headache 03/21/2017   Tooth pain    Cerebral infarction (HCC) 12/27/2015   HLD (hyperlipidemia)    Hemiparesis affecting dominant side as late effect of stroke (HCC)    Tobacco abuse    Cocaine use disorder (HCC)    ETOH abuse    Postoperative hypothyroidism 12/24/2015   Drug abuse, cocaine type (HCC) 05/09/2014   Hx of thyroid  cancer 01/25/2014   Essential hypertension 06/03/2012    Orientation RESPIRATION BLADDER Height & Weight     Self, Time, Situation, Place  Normal Continent  Weight: 159 lb 6.3 oz (72.3 kg) Height:  5\' 6"  (167.6 cm)  BEHAVIORAL SYMPTOMS/MOOD NEUROLOGICAL BOWEL NUTRITION STATUS      Continent Diet  AMBULATORY STATUS COMMUNICATION OF NEEDS Skin   Limited Assist Verbally (pt speech impacted by stroke) Normal                       Personal Care Assistance Level of Assistance  Bathing, Feeding, Dressing Bathing Assistance: Limited assistance Feeding assistance: Limited assistance Dressing Assistance: Limited assistance     Functional Limitations Info  Sight, Hearing, Speech Sight Info: Adequate Hearing Info: Adequate Speech Info: Impaired    SPECIAL CARE FACTORS FREQUENCY  PT (By licensed PT), OT (By licensed OT), Speech therapy     PT Frequency: 5xs per week OT Frequency: 5xs per week     Speech Therapy Frequency: 5xs per week      Contractures Contractures Info: Not present    Additional Factors Info  Code Status, Allergies Code Status Info: Full Allergies Info: See discharge instructions           Current Medications (04/26/2024):  This is the current hospital active medication list Current Facility-Administered Medications  Medication Dose Route Frequency Provider Last Rate Last Admin   acetaminophen  (TYLENOL ) tablet 650 mg  650 mg Oral Q4H PRN Angiulli, Daniel J, PA-C   650 mg at 04/26/24 6962   acetaminophen -codeine  (TYLENOL  #3) 300-30 MG per tablet 1 tablet  1 tablet Oral Q8H PRN Genetta Kenning, MD   1 tablet at 04/25/24 2004   bisacodyl  (DULCOLAX) suppository 10  mg  10 mg Rectal Daily PRN Love, Pamela S, PA-C   10 mg at 02/09/24 2249   calcium  citrate (CALCITRATE - dosed in mg elemental calcium ) tablet 950 mg  200 mg of elemental calcium  Oral Daily Lylia Sand, MD   950 mg at 04/26/24 0817   cholecalciferol  (VITAMIN D3) 25 MCG (1000 UNIT) tablet 1,000 Units  1,000 Units Oral Daily Lovorn, Jacqlyn Matas, MD   1,000 Units at 04/26/24 0817   clopidogrel  (PLAVIX ) tablet 75 mg  75 mg Oral Daily Reome, Earle J, RPH    75 mg at 04/25/24 2001   diclofenac  Sodium (VOLTAREN ) 1 % topical gel 2 g  2 g Topical QID PRN Lovorn, Megan, MD   2 g at 04/22/24 0840   DULoxetine  (CYMBALTA ) DR capsule 120 mg  120 mg Oral Daily Reome, Earle J, RPH   120 mg at 04/25/24 2003   enoxaparin  (LOVENOX ) injection 40 mg  40 mg Subcutaneous Q24H Reome, Earle J, RPH   40 mg at 04/25/24 2003   levothyroxine  (SYNTHROID ) tablet 100 mcg  100 mcg Oral Q0600 Angiulli, Daniel J, PA-C   100 mcg at 04/26/24 1610   lidocaine  (LIDODERM ) 5 % 2 patch  2 patch Transdermal Q24H Lovorn, Megan, MD   2 patch at 04/20/24 1957   loratadine  (CLARITIN ) tablet 10 mg  10 mg Oral Daily [address], [place], PA-C   10 mg at 04/25/24 2001   melatonin tablet 3 mg  3 mg Oral QHS Lovorn, Megan, MD   3 mg at 04/25/24 2001   methocarbamol  (ROBAXIN ) tablet 500 mg  500 mg Oral Q8H PRN Genetta Kenning, MD   500 mg at 04/25/24 9604   polyvinyl alcohol  (LIQUIFILM TEARS) 1.4 % ophthalmic solution 2 drop  2 drop Left Eye PRN Lovorn, Megan, MD       pregabalin  (LYRICA ) capsule 50 mg  50 mg Oral Daily Lovorn, Megan, MD   50 mg at 04/25/24 1827   rosuvastatin  (CRESTOR ) tablet 40 mg  40 mg Oral Daily Angiulli, Daniel J, PA-C   40 mg at 04/26/24 5409   senna-docusate (Senokot-S) tablet 2 tablet  2 tablet Oral BID Lovorn, Megan, MD   2 tablet at 04/26/24 0817   sodium chloride  (OCEAN) 0.65 % nasal spray 1 spray  1 spray Each Nare PRN Street, [place], PA-C       sodium phosphate (FLEET) enema 1 enema  1 enema Rectal Daily PRN Love, Pamela S, PA-C       sorbitol  70 % solution 30 mL  30 mL Oral Daily PRN Lylia Sand, MD         Discharge Medications: Please see discharge summary for a list of discharge medications.  Relevant Imaging Results:  Relevant Lab Results:   Additional Information [id number]  Rennis Case, LCSW

## 2024-04-27 ENCOUNTER — Other Ambulatory Visit (HOSPITAL_COMMUNITY): Payer: Self-pay

## 2024-04-27 DIAGNOSIS — I63511 Cerebral infarction due to unspecified occlusion or stenosis of right middle cerebral artery: Secondary | ICD-10-CM | POA: Diagnosis not present

## 2024-04-27 LAB — BASIC METABOLIC PANEL WITH GFR
Anion gap: 8 (ref 5–15)
BUN: 18 mg/dL (ref 6–20)
CO2: 23 mmol/L (ref 22–32)
Calcium: 8.7 mg/dL — ABNORMAL LOW (ref 8.9–10.3)
Chloride: 109 mmol/L (ref 98–111)
Creatinine, Ser: 0.8 mg/dL (ref 0.44–1.00)
GFR, Estimated: >60 mL/min (ref >60)
Glucose, Bld: 94 mg/dL (ref 70–99)
Potassium: 4.3 mmol/L (ref 3.5–5.1)
Sodium: 140 mmol/L (ref 135–145)

## 2024-04-27 NOTE — Progress Notes (Signed)
 Occupational Therapy Note  Patient Details  Name: NONNA RENNINGER MRN: [id number] Date of Birth: [birth year]  Today's Date: 04/27/2024 OT Missed Time: 30 Minutes Missed Time Reason: Other (comment) (pt reports she is just going to stay in bed today)  Pt declined therapy this morning. Pt states she is "not going do anything today and stay in bed." Pt discharging to SNF 6/11. Pt missed 30 mins skilled OT services.   Andre Kawasaki [facility] 04/27/2024, 12:08 PM

## 2024-04-27 NOTE — Progress Notes (Signed)
 PROGRESS NOTE   We've found a nursing home in [facility]- initially family said they wanted SNF here in [place], but we educated family that we've been looking for6 weeks or so and everywhere in [place] denied- this is first SNF willing to take her.   I also d/w pt a tlength that I don't think she's safe to go home when son works all day- she needs assistance to get to bathroom/walk, transfer, etc- and unless she's  good enough, there's no other option. She voiced understanding, but said she needs a ride to SNF, because son works  ROS-    Pt denies SOB, abd pain, CP, N/V/C/D, and vision changes     Objective:   No results found.   Recent Labs    04/26/24 0526  WBC 5.3  HGB 14.3  HCT 45.3  PLT 173     Recent Labs    04/27/24 0445  NA 140  K 4.3  CL 109  CO2 23  GLUCOSE 94  BUN 18  CREATININE 0.80  CALCIUM  8.7*      Intake/Output Summary (Last 24 hours) at 04/27/2024 1018 Last data filed at 04/26/2024 1835 Gross per 24 hour  Intake 472 ml  Output --  Net 472 ml       Physical Exam: Vital Signs Blood pressure 123/78, pulse 75, temperature 98.2 F (36.8 C), resp. rate 17, height 5\' 6"  (1.676 m), weight 72.3 kg, SpO2 90%.      General: awake, alert, appropriate,  sitting up in bed; hasn't eaten yet; NAD HENT: conjugate gaze; oropharynx moist CV: regular rate and rhythm; no JVD Pulmonary: CTA B/L; no W/R/R- good air movement GI: soft, NT, ND, (+)BS Psychiatric: appropriate- sad- when we discussed that I don't think she will become independent long term Neurological: Ox3- moderate dysarthria- but using more detailed words today-  Checked sensation in LE's- was intact on exam to light touch in L1-S2 dermatomes B/L Also has few beats of clonus on LLE- 3-4 beats and 2 beats on RLE, but couldn't replicate Skin: No evidence of breakdown, no evidence of rash Neurologic: Cranial nerves II  through XII intact, motor strength is 4+/5 in RIght and 4- left deltoid, bicep, tricep, grip, hip flexor, knee extensors, ankle dorsiflexor and plantar flexor Tone 4 beats of clonus at the left ankle.  Ashworth score is 0 at the knee flexors and extensors Left upper extremity Ashworth 0  Musculoskeletal: Full range of motion in all 4 extremities. No joint swelling    Physical exam unchanged from the above on reexamination 04/27/24    Assessment/Plan: 1. Functional deficits which require 3+ hours per day of interdisciplinary therapy in a comprehensive inpatient rehab setting. Physiatrist is providing close team supervision and 24 hour management of active medical problems listed below. Physiatrist and rehab team continue to assess barriers to discharge/monitor patient progress toward functional and medical goals  Care Tool:  Bathing    Body parts bathed by patient: Left arm, Chest, Abdomen, Front perineal area, Buttocks, Right upper leg, Left upper leg, Right lower leg, Left lower leg, Face   Body parts bathed by helper: Right arm Body parts n/a: Right arm  Bathing assist Assist Level: Minimal Assistance - Patient > 75%     Upper Body Dressing/Undressing Upper body dressing   What is the patient wearing?: Pull over shirt    Upper body assist Assist Level: Supervision/Verbal cueing    Lower Body Dressing/Undressing Lower body dressing      What is the patient wearing?: Pants, Underwear/pull up     Lower body assist Assist for lower body dressing: Minimal Assistance - Patient > 75%     Toileting Toileting    Toileting assist Assist for toileting: Minimal Assistance - Patient > 75%     Transfers Chair/bed transfer  Transfers assist     Chair/bed transfer assist level: Contact Guard/Touching assist     Locomotion Ambulation   Ambulation assist      Assist level: Minimal Assistance - Patient > 75% Assistive device: Walker-rolling Max distance: 120    Walk 10 feet activity   Assist     Assist level: Minimal Assistance - Patient > 75% Assistive device: Walker-hemi   Walk 50 feet activity   Assist Walk 50 feet with 2 turns activity did not occur: Safety/medical concerns (L hemiplegia and pt fatigue)  Assist level: Minimal Assistance - Patient > 75% Assistive device: Walker-hemi    Walk 150 feet activity   Assist Walk 150 feet activity did not occur: Safety/medical concerns (L hemiplegia and pt fatigue)  Assist level: Minimal Assistance - Patient > 75% Assistive device: Walker-hemi    Walk 10 feet on uneven surface  activity   Assist Walk 10 feet on uneven surfaces activity did not occur: Safety/medical concerns (L hemiplegia and pt fatigue)         Wheelchair     Assist Is the patient using a wheelchair?: Yes Type of Wheelchair: Manual    Wheelchair assist level: Supervision/Verbal cueing Max wheelchair distance: 150    Wheelchair 50 feet with 2 turns activity    Assist        Assist Level: Supervision/Verbal cueing   Wheelchair 150 feet activity     Assist      Assist Level: Supervision/Verbal cueing   Blood pressure 123/78, pulse 75, temperature 98.2 F (36.8 C), resp. rate 17, height 5\' 6"  (1.676 m), weight 72.3 kg, SpO2 90%.   Medical Problem List and Plan: 1. 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 Search for facility ongoing. May only be able to go to custodial care facility  D/c SNF- hopefully in AM  Con't CIR 1x/day  Have found location  of SNF< but awaiting insurance approval- pt agreed.   2. DVT/anticoagulation:  Pharmaceutical: Lovenox              -antiplatelet therapy: continue Aspirin  81 mg daily  and Plavix  75 mg daily x 3 weeks then Plavix  alone  3. Pain Management: continue Tylenol  as needed  4/3- having new burning pain in LUE- which is seen sometimes with stroke- will add Duloxetine  30 mg daily for nerve pain- monitor for side effects.  -if gets nausea, will change to Lyrica  4/4-6 no side effects so far-  4/8- will increase Duloxetine  to 60 mg daily  DC hydrocodone  due to side effects, trial  tyl #3  as needed for pain that is not relieved by Tylenol . 4/28- advised pt to use voltaren  gel  on all sides of L hand including between fingers- use tylenol  #3 as needed 5/1- pt said using voltaren  gel and tylenol  #3 as needed- adding lidoderm  patches 2 patches at night for back pain 5/2- HA's- will restart SCHEDULED Topamax  75 mg at bedtime- not sure what occurred? Needs to get nightly 5/11- Add Lyrica  50 mg BID for nerve pain in L hand 5/13- changed to q6pm since pt feels it affected her vision-made it worse 5/14- said pain better today- not clear if due to prednisone  vs Lyrica ? 5/15 pain much better in L hand-  pt feels it's gout? It might be 5/16- hand pain slightly better with ACE wrap overnight- which is fine 5/20- pt reports having migraines every AM, but refusing topamax  last few days- at least 3- will go back and d/w pt and NOT make changes as we discussed 5/21- will reduce Topamax  to 25 mg at bedtime to help with feelinga little confused 5/22- Will stop Topamax  since made her so "loopy"- pt doesn't want any other meds to try to help HA's- said will take Tylenol  5/26- Still having migraines intermittently 5/27- L hand and wrist pain- radiates to L shoulder- no deformity- but holds hand in vulcan gesture at rest- xrays pending 5/29- 5/30- hand swelling and pain almost back to baseline 5-31: Last use of as needed Tylenol  with codeine  5-29, patient wishes to keep on board.  Defer to primary team 6/10- will con't tylenol  #3 as needed- doesn't use frequently- HA's doing better 4.  Depression: continue duloxetine         5. Neuropsych/cognition: This patient is capable of making decisions on her own behalf. 6. Skin/Wound Care: Routine skin checks 7. Fluids/Electrolytes/Nutrition:encouraging po.   5/12- I personally reviewed the patient's labs today.     -encouraging PO 8.  Dysphagia.  Dysphagia 3 with thins currently  4/18 intake ok 5/11-5/13- advised pt to take smaller bites-  intake good, but takes too big of bites    5/14- switched back to full supervision- pt unhappy, but wasn't listening when I discussed more of how to eat/take bites 5/16- taking smaller bites with full supervision 5/18 eating 65 to 95% of her meals today 5/22- Back to full supervision- has to sit completely upright to eat 5/23- if this goes better, can go back to intermittent supervision once SLP decided 5-31: P.o. intakes appear adequate.  9.  Hyperlipidemia.  Continue Crestor  10.  Recent History of cocaine/tobacco use.  UDS positive for cocaine.  Provide counseling, this has been a chronic issue , seen in PCP notes 11.  Hypothyroidism. continue Synthroid   12.  Constipation.   Senokot-S 2 tab twice daily  5/6- LBM yesterday even though chart says 5/3  5/7- will add Miralax - esp since increased Duloxetine  which can cause constipation- already on Senna 2 tabs BID  6/10- LBM yesterday 15. Blurry vision and dysconjugate gaze  3/27- will need Ophtho after d/c.   4/4- pt reports everything blurry- cannot see faces as a result- never had eye checked in past- used readers but cannot see at a distance since stroke- will send to Ophtho after d/c.   4/7- d/w pt again- she wants eyeglasses- explained we cannot get when in hospital- also per guidelines, since vision can still change, to not get for 3 months after stroke. She disbelieves it's from stroke, since had 2 prior- that didn't cause  vision changes- we educated pt pn how this can occur.   4/25- reminded pt cannot get Optho in hospital- need to get  once d/c'd  4/30 went over visual changes with pt- will need to get glasses, likely prisms 2-3 months after d/c once vision stabilizes  5/6- d/w pt again- she wants to see Optho- explained they do not come into hospital for this  5/18 continued blurry vision, discussed outpatient f/u  5/23- pt having blurry vision again this AM, she says slightly worse- will con't to monitor  5-31: No complaints of blurry vision  16.  Left hip pain.  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 Severe HA-daily HA's  4/15-16 sx appear improved with topamax , tylenol  helps also   -may use sx to get out of therapy at times?  4/23- Per team, pt having HA's frequently which impairs therapy- she won't discuss HA's with me- unclear if only has a day goes on vs self limting behavior  4/24- increase to 75 mg at bedtime  4/25- still having HA's, actually thinks a little worse since not taking Norco for it-   4/28-4/29 fewer complaints about HA this AM  4/30-5/1- - per staff, c/o these less- not interfering with therapy as much  5/2- Wasn't getting topamax - just taking T#3- wil restart Topamax - don't know what occurred with order  5/6- HA's doing better  5/8- not requiring T#3 anymore lately per pt  5/13- Severe HA 5/11 in afternoon- took T#3  5/17 headache today does not sound severe, she has not taken her Tylenol  3 yet but nursing is getting it for her  5/18 headache improved with Tylenol  No. 3 today  5/20- refusing topamax - for past 3+ days- will go back and d/w pt  6/5- stopped Topamax - due ot side  effects Per LPN HA pain is improving  18. Decreased safety awareness/impulsiveness  4/30- still requiring max cues  5/7- 5/10- no change-per therapy- still requiring max cues  5/23- Still requiring mod-max cues for safety  20. Spasticity  4/22- off baclofen , and c/o LUE pain- wondering how much of her complaints are due to tone?-   4/30- Spasticity improved in LUE- MAS of 0 actually this AM  5/11-12- Getting some spasticity vs disuse tightness inL hand- fingers esp 3rd/4th DIPs- might need Botox after d/c  5/14- MAS of 1 to 1+ in L wrist and fingers- not proximally  5/27- Spasticity slightly worse this AM- esp when yawns- explained that's normal with spasticity due to increased pain Left upper extremity left lower extremity spasms yesterday has been off baclofen  for >84mo , tried robaxin  received 2 doses since yesterday which was helpful. This sounds like spasticity however other than clonus at the left ankle tone looks okay today.  Seemed to respond to Robaxin  but baclofen  may be more appropriate in this situation.  At this point the problem appears to be self-limited and may not need this medication on a long-term basis  6/9- I agree with above- hopefully we don't need to start a spasticity agent 21. 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 .   4/30- pt pulled scab off-  22.  Urinary  and bowel incont due to CVA, continue toileting program 4/23- still cannot control B/B 4/30- on toileting program- is getting somewhat better, but still incontinent more at night  5/13- still incontinent at night  5/18 continued incontinence   23. 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   4/29- will get Dopplers, because pt says she elevated hand overnight- still swollen- ordered  4/30- Dopplers (-)- will get isotoner compression glove  5/4 received glove  5/6- Swelling ~ 50% better- will increase Duloxetine  to 120 mg daily for nerve pain in L hand and mood.   5/7- hand sore this AM- but not "killing her".   5/8- pain great this AM- doesn't need meds other than tylenol  per pt -sounds like increased cymbalta  has helped  5/9- Some pain in L hand this Am, but hasn't taken tylenol  or T#3 yet this Am- 5/13- Will decrease Lyrica  to 50 mg q6pm since caused her to have visual changes early in day  5/14- Pain doing better in last 12+ hours- not as painful  24. C/O catch in breath- no SOB  5/9- asked pt to use ICS every 1 hour while awake- since probably is atelectasis   25. Dry eyes continue  eye drops as needed for pt  26. Gout? Swelling of L hand  5/13- pt has never had gout but runs in her family- she wants to try something else ot see if would help L hand- will try Colchicine  0.6 mg daily x 4 days as well as Prednisone  20 mg daily x 4 days-  5/14- pt feels pain and swelling are better- not sure if due to gout, or just prednisone  vs power of suggestion- will con't to monitor  5/15- pt feels swelling and pain is better- maybe it is gout? Vs overall improvement with prednisone   27. Vaginal candidiasis  5/15- pt reports Sx's- will give Diflucan  150 mg x1   5/22- no more complaints  28. Worsening urinary incontinence/frequency at night  Discussed that UA and UC are negative  - 5-31, 6/1: Mostly continent except at nighttime.  Monitor. 29. Sedation with Melatonin  6/5- will reduce to 3 mg at bedtime and change to 8pm  6/1- sleeping better with lower dose   I spent a total of  41  minutes on total care today- >50% coordination of care- due to  D/w pt about SNF and not really other options- since took so long to  find this one. She wants to leave tomorrow, not today   LOS: 78 days A FACE TO FACE EVALUATION WAS PERFORMED  Ruvim Risko 04/27/2024, 10:18 AM

## 2024-04-27 NOTE — Plan of Care (Signed)
  Problem: RH Swallowing Goal: LTG Patient will consume least restrictive diet using compensatory strategies with assistance (SLP) Description: LTG:  Patient will consume least restrictive diet using compensatory strategies with assistance (SLP) Outcome: Completed/Met Flowsheets (Taken 04/27/2024 1557) LTG: Pt Patient will consume least restrictive diet using compensatory strategies with assistance of (SLP): Supervision   Problem: RH Expression Communication Goal: LTG Patient will increase speech intelligibility (SLP) Description: LTG: Patient will increase speech intelligibility at word/phrase/conversation level with cues, % of the time (SLP) Outcome: Completed/Met Flowsheets Taken 04/27/2024 1557 by Adela Holter, CCC-SLP LTG: Patient will increase speech intelligibility (SLP): Supervision Taken 04/22/2024 1002 by [name] A, CCC-SLP Level: Conversation level Percent of time patient will use intelligible speech: 90

## 2024-04-27 NOTE — Progress Notes (Signed)
 Occupational Therapy Discharge Summary  Patient Details  Name: Theresa Watkins MRN: [id number] Date of Birth: 23-May-1966  Date of Discharge from OT service:April 27, 2024  atient has met 11 of 11 long term goals due to {due UU:7253664}.  Pt made inconsistent progress with BADLs during this admission 2/2 pt's unwillingness to participate in therapy sessions. Pt requires min A for bathing and UB dressing tasks. Mod A for LB dressing TAsks. Min A for functional transfers and toileting tasks. Pt requires mod verbal cues for safety awareness and anticipatory awareness. Pt initiating use of LUE during functional tasks and requires min A to use at diminished level. Pt discharging to SNF for continued rehabilitation. Family attended one therapy session during this admission. Patient to discharge at overall {LOA:3049010} level.  Patient's care partner {care partner:3041650} to provide the necessary {assistance:3041652} assistance at discharge.    Reasons goals not met: n/a  Recommendation:  Patient will benefit from ongoing skilled OT services in {setting:3041680} to continue to advance functional skills in the area of {ADL/iADL:3041649}.  Equipment: No equipment provided  Reasons for discharge: {Reason for discharge:3049018}  Patient/family agrees with progress made and goals achieved: {Pt/Family agree with progress/goals:3049020}  OT Discharge ADL ADL Eating: Supervision/safety, Set up Where Assessed-Eating: Bed level (HOB raised) Grooming: Setup Where Assessed-Grooming: Sitting at sink Upper Body Bathing: Minimal assistance Where Assessed-Upper Body Bathing: Shower Lower Body Bathing: Supervision/safety Where Assessed-Lower Body Bathing: Shower Upper Body Dressing: Minimal assistance Where Assessed-Upper Body Dressing: Edge of bed Lower Body Dressing: Minimal assistance Where Assessed-Lower Body Dressing: Standing at sink, Edge of bed Toileting: Minimal assistance Where  Assessed-Toileting: Teacher, adult education: Curator Method: Squat pivot, Stand pivot Toilet Transfer Equipment: Engineer, technical sales: Not assessed Tub/Shower Transfer Method: Unable to assess Film/video editor: Insurance underwriter Method: Stand pivot, Administrator: Shower seat with back ADL Comments: increased fatigue with ADL tasks, attempting to incorporate LUE into ADLs but unable, presents with severe dysarthria and has dentures on top teeth Vision Baseline Vision/History: 1 Wears glasses Patient Visual Report: Blurring of vision Vision Assessment?: Vision impaired- to be further tested in functional context;Wears glasses for reading Perception  Perception: Impaired Praxis Praxis: WFL Cognition Cognition Overall Cognitive Status: History of cognitive impairments - at baseline Arousal/Alertness: Awake/alert Orientation Level: Person;Place;Situation Person: Oriented Place: Oriented Situation: Oriented Memory: Appears intact Memory Impairment: Decreased recall of new information Attention: Sustained;Selective Sustained Attention: Appears intact Selective Attention: Impaired Selective Attention Impairment: Verbal basic;Functional basic Awareness: Impaired Awareness Impairment: Emergent impairment;Anticipatory impairment Problem Solving: Appears intact Decision Making: Impaired Initiating: Impaired Behaviors: Impulsive;Poor frustration tolerance Safety/Judgment: Impaired Brief Interview for Mental Status (BIMS) Repetition of Three Words (First Attempt): 3 Temporal Orientation: Year: Correct Temporal Orientation: Month: Accurate within 5 days Temporal Orientation: Day: Correct Recall: "Sock": Yes, no cue required Recall: "Blue": Yes, no cue required Recall: "Bed": Yes, no cue required BIMS Summary Score: 15 Sensation Sensation Light Touch: Impaired by gross assessment Hot/Cold:  Appears Intact Proprioception: Impaired by gross assessment Additional Comments: sensation to L upper thigh >R, R>L medial knee, L>R dorsum toes, all other LE sensation same side to side and WNL Coordination Gross Motor Movements are Fluid and Coordinated: No Fine Motor Movements are Fluid and Coordinated: No Coordination and Movement Description: LUE weak Motor  Motor Motor: Hemiplegia Motor - Skilled Clinical Observations: Lt side, UE>LE Mobility  Bed Mobility Bed Mobility: Rolling Right;Rolling Left;Sit to Supine;Supine to Sit Rolling Right: Independent with assistive device Rolling  Left: Independent with assistive device Supine to Sit: Independent with assistive device Sit to Supine: Independent with assistive device Transfers Sit to Stand: Contact Guard/Touching assist Stand to Sit: Contact Guard/Touching assist  Trunk/Postural Assessment  Cervical Assessment Cervical Assessment: Within Functional Limits Thoracic Assessment Thoracic Assessment: Within Functional Limits Lumbar Assessment Lumbar Assessment: Within Functional Limits Postural Control Postural Control: Deficits on evaluation Righting Reactions: delayed  Balance Balance Balance Assessed: Yes Static Sitting Balance Static Sitting - Balance Support: Feet unsupported;Right upper extremity supported Static Sitting - Level of Assistance: 5: Stand by assistance Dynamic Sitting Balance Dynamic Sitting - Balance Support: During functional activity Dynamic Sitting - Level of Assistance: 4: Min Oncologist Standing - Balance Support: During functional activity Static Standing - Level of Assistance: 4: Min assist Dynamic Standing Balance Dynamic Standing - Balance Support: During functional activity Dynamic Standing - Level of Assistance: 4: Min assist Extremity/Trunk Assessment RUE Assessment RUE Assessment: Within Functional Limits LUE Assessment Passive Range of Motion (PROM)  Comments: WFL Active Range of Motion (AROM) Comments: ~90* at shoulder; elbow WFL LUE Body System: Neuro Brunstrum levels for arm and hand: Arm;Hand Brunstrum level for arm: Stage V Relative Independence from Synergy Brunstrum level for hand: Stage V Independence from basic synergies   [name] 04/27/2024, 3:22 PM

## 2024-04-27 NOTE — Progress Notes (Signed)
 Patient ID: SINIYAH EVANGELIST, female   DOB: Nov 05, 1966, 58 y.o.   MRN: [id number]  SW met with pt in room to discuss SNF placement. Pt is in agreement.  SW spoke with pt son Vonii and he is in agreement at as well. Family would like to be there at her time of admission. SW will make efforts to make accommodations. They would like to know if she will have a private room. SW will follow-up once there are more updates.   SW spoke with [name]/liaison with [facility] to discuss above. States will need discharge summary by 2pm as initially discussed transportation being arranged for 3pm.  Pt will have private room; Rm#[id number]; Nurse Report [phone] and ask for [facility] Nurse. SW will need to fax hard scripts to pharmacy at #[phone].  SW faxed scripts. SW unable to schedule transportation due ot managed Medicaid and will need to arrange through insurance. SW will arrange transportation through her insurance tomorrow morning for pick up request at 1pm due to insurance having a 4hr pick up window from time of requested pick up.   SW updated pt son via text with above details.   Norval Been, MSW, LCSW Office: [phone] Cell: [phone] Fax: [phone]

## 2024-04-27 NOTE — Patient Care Conference (Signed)
 Inpatient RehabilitationTeam Conference and Plan of Care Update Date: 04/27/2024   Time: 1104 am    Patient Name: Theresa Watkins      Medical Record Number: [id number]  Date of Birth: October 19, 1966 Sex: Female         Room/Bed: 4W01C/4W01C-01 Payor Info: Payor: [place] MEDICAID PREPAID HEALTH PLAN / Plan: [place] MEDICAID HEALTHY BLUE / Product Type: *No Product type* /    Admit Date/Time:  02/09/2024  6:29 PM  Primary Diagnosis:  Right middle cerebral artery stroke [facility])  Hospital Problems: Principal Problem:   Right middle cerebral artery stroke [facility]) Active Problems:   Polysubstance abuse [facility])    Expected Discharge Date: Expected Discharge Date: 04/28/24  Team Members Present: Physician leading conference: Dr. Celia Coles Social Worker Present: Norval Been, LCSW Nurse Present: Jerene Monks, RN PT Present: Aundria Leech, PT OT Present: [name], OT;Jackqueline Mason, COTA SLP Present: Jenney Modest, SLP PPS Coordinator present : Jestine Moron, SLP     Current Status/Progress Goal Weekly Team Focus  Bowel/Bladder   continent of b/b; LBM: 6/8   remain continent   assist in toileting needs PRN    Swallow/Nutrition/ Hydration   reg/thin full supervision   supervisionA  carryover of strategies    ADL's   progress and participation inconsistent; continues to require min/mod verbal cues for safety; continues to initiate use of LUE in funtional tasks   min A overall; LB dressing downgraded to mod A; dynamic standing balance downgraded to min A; anticipatory awareness downgraded to mod A   safety awareness, education, tranfsers, LUE NMR    Mobility   Performance varies by pt mood: Supervision-moda  bed mobility, near supervision some transfers, otherwise min-CGA for safety. 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  Patient limited by impulsivity, decreased  safety awareness,  mixed dysarthria and self limiting behaviors.  Patient on target to meet rehab goals: Yes, currently patient requires mod assist with ADLs and transfers. Patient able to ambulate  up to 120' with min-CGA assist using a RW. Patient was 90% at conversational level. Overall goals at discharge are set for minimal assistance.   *See Care Plan and progress notes for long and short-term goals.   Revisions to Treatment Plan:   Hemi dressing techniques  Blanca Bunch Hemi walker Rolling walker IOPI EMST device Full supervision with meals  Teaching Needs: Safety, medications, transfers, toileting , dietary modifications,etc     Current Barriers to Discharge: Decreased caregiver support and Incontinence  Possible Resolutions to Barriers: Family Education SNF    Medical Summary Current Status: using tylenol  #3 a lot less so less HA's and less pain in L hand- does have spasticity- not enough for dantrolene-still incontnent B/B-  usually at night  Barriers to Discharge: Behavior/Mood;Self-care education;Spasticity;Medical stability  Barriers to Discharge Comments: to leave today/tomorrow- since canot go home Possible Resolutions to Barriers/Weekly Focus: slef limiting- dc/ today/tomorrow   Continued Need for Acute Rehabilitation Level of Care: The patient requires daily medical management by a physician with specialized training in physical medicine  and rehabilitation for the following reasons: Direction of a multidisciplinary physical rehabilitation program to maximize functional independence : Yes Medical management of patient stability for increased activity during participation in an intensive rehabilitation regime.: Yes Analysis of laboratory values and/or radiology reports with any subsequent need for medication adjustment and/or medical intervention. : Yes   I attest that I was present, lead the team conference, and concur with the assessment and plan of the team.   Vilda Zollner Gayo 04/27/2024, 1104 am

## 2024-04-27 NOTE — Progress Notes (Signed)
 Physical Therapy Discharge Summary  Patient Details  Name: Theresa Watkins MRN: [id number] Date of Birth: 18-Jun-1966  Date of Discharge from PT service:April 27, 2024     Patient has met 3 of 4 long term goals due to improved balance, increased strength, and ability to compensate for deficits.  Patient to discharge at a wheelchair level Min Assist.   Patient's care partner is independent to provide the necessary physical assistance at discharge. Pt to d/c to SNF, so family education provided.  Reasons goals not met: Pt has not met car transfer goal d/t limited participation and SNF d/c  Recommendation:  Patient will benefit from ongoing skilled PT services in skilled nursing facility setting to continue to advance safe functional mobility, address ongoing impairments in gait mechanics, LLE strength and neuromuscular control, and minimize fall risk.  Equipment: No equipment provided  Reasons for discharge: treatment goals met and discharge from hospital  Patient/family agrees with progress made and goals achieved: Yes  PT Discharge Precautions/Restrictions Precautions Precautions: Fall Precaution/Restrictions Comments: L hemi Restrictions Weight Bearing Restrictions Per Provider Order: No Vital Signs Therapy Vitals Temp: 98.8 F (37.1 C) Temp Source: Oral Pulse Rate: 89 Resp: 17 BP: 112/70 Patient Position (if appropriate): Lying Oxygen Therapy SpO2: 95 % O2 Device: Room Air Pain   Pain Interference Pain Interference Pain Effect on Sleep: 3. Frequently Pain Interference with Therapy Activities: 2. Occasionally;3. Frequently Pain Interference with Day-to-Day Activities: 3. Frequently Vision/Perception  Vision - History Ability to See in Adequate Light: 1 Impaired Perception Perception: Impaired Preception Impairment Details: Spatial orientation;Body Scheme Praxis Praxis: WFL  Cognition Overall Cognitive Status: History of cognitive impairments - at  baseline Arousal/Alertness: Awake/alert Orientation Level: Oriented to person;Oriented to place;Oriented to situation Attention: Sustained;Selective Sustained Attention: Appears intact Selective Attention: Impaired Selective Attention Impairment: Verbal basic;Functional basic Memory: Appears intact Memory Impairment: Decreased recall of new information Awareness: Impaired Awareness Impairment: Emergent impairment;Anticipatory impairment Problem Solving: Appears intact Decision Making: Impaired Initiating: Impaired Behaviors: Impulsive;Poor frustration tolerance Safety/Judgment: Impaired Sensation Sensation Light Touch: Impaired by gross assessment Hot/Cold: Appears Intact Proprioception: Impaired by gross assessment Additional Comments: sensation to L upper thigh >R, R>L medial knee, L>R dorsum toes, all other LE sensation same side to side and WNL Coordination Gross Motor Movements are Fluid and Coordinated: No Fine Motor Movements are Fluid and Coordinated: No Coordination and Movement Description: LUE weak Motor  Motor Motor: Hemiplegia Motor - Skilled Clinical Observations: Lt side, UE>LE  Mobility Bed Mobility Bed Mobility: Rolling Right;Rolling Left;Sit to Supine;Supine to Sit Rolling Right: Independent with assistive device Rolling Left: Independent with assistive device Supine to Sit: Independent with assistive device Sit to Supine: Independent with assistive device Transfers Transfers: Sit to Stand;Stand to Sit;Squat Pivot Transfers Sit to Stand: Contact Guard/Touching assist Stand to Sit: Contact Guard/Touching assist Stand Pivot Transfers: Minimal Assistance - Patient > 75% Squat Pivot Transfers: Supervision/Verbal cueing Transfer (Assistive device): Rolling walker Locomotion  Gait Ambulation: Yes Gait Assistance: Minimal Assistance - Patient > 75% Gait Distance (Feet): 170 Feet Assistive device: Rolling walker;Hemi-walker Gait Assistance Details: Manual  facilitation for weight bearing;Manual facilitation for weight shifting;Manual facilitation for placement;Verbal cues for technique;Verbal cues for precautions/safety;Verbal cues for gait pattern Gait Gait: Yes Gait Pattern: Decreased step length - left;Decreased stance time - left;Poor foot clearance - left;Decreased weight shift to left;Decreased dorsiflexion - left;Step-to pattern;Left flexed knee in stance Stairs / Additional Locomotion Stairs: Yes Stairs Assistance: Minimal Assistance - Patient > 75% Stair Management Technique: Two rails Number of Stairs: 4  Height of Stairs: 6 Pick up small object from the floor (from standing position) activity did not occur: Safety/medical concerns Naval architect Mobility: Yes Wheelchair Assistance: Doctor, general practice: Both lower extermities Wheelchair Parts Management: Needs assistance Distance: 300  Trunk/Postural Assessment  Cervical Assessment Cervical Assessment: Within Functional Limits Thoracic Assessment Thoracic Assessment: Within Functional Limits Lumbar Assessment Lumbar Assessment: Within Functional Limits Postural Control Postural Control: Deficits on evaluation Righting Reactions: delayed  Balance Balance Balance Assessed: Yes Static Sitting Balance Static Sitting - Balance Support: Feet unsupported;Right upper extremity supported Static Sitting - Level of Assistance: 5: Stand by assistance Dynamic Sitting Balance Dynamic Sitting - Balance Support: During functional activity Dynamic Sitting - Level of Assistance: 4: Min Oncologist Standing - Balance Support: During functional activity Static Standing - Level of Assistance: 4: Min assist Dynamic Standing Balance Dynamic Standing - Balance Support: During functional activity Dynamic Standing - Level of Assistance: 4: Min assist Extremity Assessment  RUE Assessment RUE Assessment: Within Functional  Limits LUE Assessment Passive Range of Motion (PROM) Comments: WFL Active Range of Motion (AROM) Comments: ~90* at shoulder; elbow WFL LUE Body System: Neuro Brunstrum levels for arm and hand: Arm;Hand Brunstrum level for arm: Stage V Relative Independence from Synergy Brunstrum level for hand: Stage V Independence from basic synergies RLE Assessment RLE Assessment: Within Functional Limits LLE Assessment LLE Assessment: Exceptions to [facility] LLE Strength Left Hip Flexion: 4-/5 Left Knee Flexion: 4-/5 Left Knee Extension: 3+/5 Left Ankle Dorsiflexion: 3+/5 Left Ankle Plantar Flexion: 3/5   Nieves Barberi C Riku Buttery 04/27/2024, 3:24 PM

## 2024-04-27 NOTE — Progress Notes (Signed)
 Physical Therapy Note  Patient Details  Name: Theresa Watkins MRN: [id number] Date of Birth: December 31, 1965 Today's Date: 04/27/2024   Today's Date: 04/27/2024 PT Missed Time: 30 Minutes Missed Time Reason: Patient unwilling to participate   Pt refuses therapy secondary to pt stating discharging to SNF today. Pt missing 30 min of skilled therapy session.  Annia Kilts PT, DPT 04/27/2024, 10:07 AM

## 2024-04-27 NOTE — Progress Notes (Signed)
 Speech Language Pathology Discharge Summary  Patient Details  Name: Theresa Watkins MRN: [id number] Date of Birth: [birth year]  Date of Discharge from SLP service:April 27, 2024  Today's Date: 04/27/2024 SLP Individual Time: 0101-0131 SLP Individual Time Calculation (min): 30 min  Skilled Therapeutic Interventions: Patient was seen in PM to address speech intelligibility and dysphagia management. Pt was alert and seen at bedside. She presents as aware of upcoming discharge though verbalized feeling upset about location. Pt agreeable for speech therapy session. She recalled speech intelligibility strategies including over articulation, pausing, and slow rate. SLP reviewed remaining strategy and example of utilization. SLP also reviewed safe swallowing strategies including positioning recommendations, slow rate, small bites/ sips, and oral hygiene BID. Pt demo adequate use of strategy as indicated by repositioning upright to take a sip of liquid spontaneously during session. Throughout session, pt demonstrating awareness of speech and able to correct errors with supervision. She maintained ~90% intelligibility at the conversation level. Pt left in bed with call button within reach and bed alarm active. SLP to continue POC.   Patient has met 2 of 2 long term goals.  Patient to discharge at overall Supervision level.  Reasons goals not met:     Clinical Impression/Discharge Summary: Pt has made steady gains and has met 2 of 2 LTG's this admission due to improved swallowing and speech intelligibility. Patient tolerates regular/thin liquid diet but continues to benefit from full supervision due to inconsistent use of compenstaory strategies and remaining high risk of aspiration. Speech intelligibility fluctuates between 70-90% to familiar and unfamiliar listeners with pt able to idenitfy some variables that contribute to fluctuating speech intelligibility. Pt/ family education complete and pt will discharge  to a skilled nursing facility with 24 hour supervision. Pt would benefit fom f/u SLP services to maximize speech intelligibility and ensure safe swallowing in order to maximize her functional independence.   Care Partner:  Caregiver Able to Provide Assistance: Yes  Type of Caregiver Assistance: Cognitive  Recommendation:  Skilled Nursing facility  Rationale for SLP Follow Up: Maximize swallowing safety;Maximize functional communication   Equipment: none   Reasons for discharge: Treatment goals met;Discharged from hospital   Patient/Family Agrees with Progress Made and Goals Achieved: Yes    Adela Holter 04/27/2024, 4:02 PM

## 2024-04-28 ENCOUNTER — Other Ambulatory Visit (HOSPITAL_COMMUNITY): Payer: Self-pay

## 2024-04-28 NOTE — Progress Notes (Signed)
 Inpatient Rehabilitation Care Coordinator Discharge Note   Patient Details  Name: Theresa Watkins MRN: [id number] Date of Birth: 1966/02/01   Discharge location: D/c to [facility]  Length of Stay: 78 days  Discharge activity level: Min Asst  Home/community participation: Limited  Patient response WU:JWJXBJ Literacy - How often do you need to have someone help you when you read instructions, pamphlets, or other written material from your doctor or pharmacy?: Sometimes  Patient response YN:WGNFAO Isolation - How often do you feel lonely or isolated from those around you?: Rarely  Services provided included: MD, RD, PT, OT, SLP, RN, CM, TR, Pharmacy, Neuropsych, SW  Financial Services:  Field seismologist Utilized: Media planner [place] Medicaid Healthy [employer]  Choices offered to/list presented to: Yes  Follow-up services arranged:              Patient response to transportation need: Is the patient able to respond to transportation needs?: Yes In the past 12 months, has lack of transportation kept you from medical appointments or from getting medications?: No In the past 12 months, has lack of transportation kept you from meetings, work, or from getting things needed for daily living?: No   Patient/Family verbalized understanding of follow-up arrangements:  Yes  Individual responsible for coordination of the follow-up plan: contact pt son Vonii  Confirmed correct DME delivered: Rennis Case 04/28/2024    Comments (or additional information):fam edu completed  Summary of Stay    Date/Time Discharge Planning CSW  04/27/24 1406 Pt has bed  offer at [facility] and insurance approved. D/c is pending pt and family. SW will confirm there are no barriers to discharge. AAC  04/20/24 1056 Current barriers remains SNF with bed offer and insurance approval pending. [place]DSS Placement SW is working on assisting with finding SNF as well. SW will confirm  there are no barriers to discharge. AAC  04/13/24 1111 Current barriers remains SNF with bed offer and insurance approval pending. [place] SW is working on assisting with finding SNF as well. SW will confirm there are no barriers to discharge. AAC  04/05/24 0917 SNF with bed offer and insurance approval pending. [place] DSS referral for placement- pending as limited options for SNF with MA. APS referral declined. DBS  03/30/24 1038 Pt is SNF bed offer and insurance approval. Pt is no longer approved for CIR beginning on 03/16/24. Limited SNF options due to insurnace. APS referral was declined and referral made for placement SW. SW will confirm there are no barriers to discharge. AAC  03/23/24 1118 Pt is SNF placement. Discharge pending extension in CIR, and SNF bed offer and insurance approval. Limited SNF options due to insurnace. SW will confirm there are no barriers to discharge. AAC  03/15/24 1032 Pt is SNF placement. Discharge pending extension in CIR, and SNF bed offer and insurance approval.  Limited SNF options due to insurnace.  SW will confirm there are no barriers to discharge. AAC  03/08/24 1430 D/c pending family decision. Home vs SNF. Limited SNF options due to insurnace. SW provided sitter list. SW will confirm there are no barriers to discharge. AAC  03/02/24 1121 D/c pending family decision. Home vs SNF. Limited SNF options due to insurnace.  SW provided sitter list. SW will confirm there are no  barriers to discharge. AAC  02/17/24 1002 Pt will d/c to home with her mother that is not able to provide physical assistance. PRN support from her son that works. Will confirm there are no barriers to discharge. AAC  02/11/24 1441 Pt will d/c to home with her mother that is not able to provide physical assistance. PRN support from her son that works. Will confirm there are no barriers to discharge. AAC       Adylynn Hertenstein A Brendolyn Callas

## 2024-04-28 NOTE — Progress Notes (Signed)
 Inpatient Rehabilitation Discharge Medication Review by a Pharmacist  A complete drug regimen review was completed for this patient to identify any potential clinically significant medication issues.  High Risk Drug Classes Is patient taking? Indication by Medication  Antipsychotic No   Anticoagulant No   Antibiotic No   Opioid Yes Tylenol  #3 prn pain  Antiplatelet Yes Clopidogrel  - CVA  Hypoglycemics/insulin No   Vasoactive Medication No   Chemotherapy No   Other Yes Duloxetine , Lidocaine -pain Methocarbamol  - prn muscle spasms Pregabalin  - neuropathy, pain Levothyroxine  - low thyroid  Rosuvastatin  - HLD     Type of Medication Issue Identified Description of Issue Recommendation(s)  Drug Interaction(s) (clinically significant)     Duplicate Therapy     Allergy     No Medication Administration End Date     Incorrect Dose     Additional Drug Therapy Needed     Significant med changes from prior encounter (inform family/care partners about these prior to discharge).    Other       Clinically significant medication issues were identified that warrant physician communication and completion of prescribed/recommended actions by midnight of the next day:  No  Name of provider notified for urgent issues identified:   Provider Method of Notification:     Pharmacist comments:   Time spent performing this drug regimen review (minutes):  20 minutes  Thank you. Lennice Quivers, PharmD

## 2024-04-28 NOTE — Progress Notes (Signed)
 Recreational Therapy Discharge Summary Patient Details  Name: Theresa Watkins MRN: [id number] Date of Birth: 07/23/1966 Today's Date: 04/28/2024  Comments on progress toward goals: Pt is scheduled for d/c to SNF today at overall min assist level.  TR sessions focused on activity tolerance, dynamic balance, socialization, therapeutic dance, pet therapy.  Also provided education on activity analysis/modifications and stress management/coping.  Pt participates in simple-moderate tasks seated w/c level with supervision and modifications. Reasons for discharge: discharge from hospital  Follow-up: Encourage TR or activities program participation at [facility]   Patient/family agrees with progress made and goals achieved: Yes  [name] 04/28/2024, 8:35 AM

## 2024-04-28 NOTE — Progress Notes (Signed)
 Patient ID: Theresa Watkins, female   DOB: Apr 03, 1966, 58 y.o.   MRN: [id number]  SW scheduled transportation for 1pm pick up with [facility] [phone]). SW informed pick up will be 2pm; ref# [id number]. SW waiting on follow-up about assigned transportation vendor.   SW received call from [place] with [employer] reporting [facility] will pick up at 1pm.  SW spoke with pt son in length about process for admission today and will ask liaison to be sure he is called when his mother has to sign admission forms. SW spoke with liaison [place] on above. SW left d/c packet with assigned RN.   Norval Been, MSW, LCSW Office: [phone] Cell: [phone] Fax: [phone]

## 2024-04-28 NOTE — Progress Notes (Signed)
 PROGRESS NOTE   Pt reports she's only going to SNF for 2 weeks, then plans on going back to her apt on her own- I explained that she needs to be at SNF- there's no one to care for her during the day while son works.   She said she's still going home- but  I think this is her decreased safety awareness.   ROS-     Pt denies SOB, abd pain, CP, N/V/C/D, and vision changes     Objective:   No results found.   Recent Labs    04/26/24 0526  WBC 5.3  HGB 14.3  HCT 45.3  PLT 173     Recent Labs    04/27/24 0445  NA 140  K 4.3  CL 109  CO2 23  GLUCOSE 94  BUN 18  CREATININE 0.80  CALCIUM  8.7*     No intake or output data in the 24 hours ending 04/28/24 0749      Physical Exam: Vital Signs Blood pressure (!) 142/83, pulse 99, temperature 98.3 F (36.8 C), temperature source Oral, resp. rate 17, height 5' 6 (1.676 m), weight 72.3 kg, SpO2 99%.      General: awake, alert, appropriate, sitting up eating her breakfast; NAD HENT: conjugate gaze; oropharynx moist CV: regular rate; no JVD Pulmonary: CTA B/L; no W/R/R- good air movement GI: soft, NT, ND, (+)BS Psychiatric: appropriate Neurological: Ox3- moderate dysarthria- no real change lately Checked sensation in LE's- was intact on exam to light touch in L1-S2 dermatomes B/L Also has few beats of clonus on LLE- 3-4 beats and 2 beats on RLE, but couldn't replicate Skin: No evidence of breakdown, no evidence of rash Neurologic: Cranial nerves II through XII intact, motor strength is 4+/5 in RIght and 4- left deltoid, bicep, tricep, grip, hip flexor, knee extensors, ankle dorsiflexor and plantar flexor Tone 4 beats of clonus at the left ankle.  Ashworth score is 0 at the knee flexors and extensors Left upper extremity Ashworth 0  Musculoskeletal: Full range of motion in all 4 extremities. No joint swelling    Physical exam unchanged from the above  on reexamination 04/28/24    Assessment/Plan: 1. Functional deficits which require 3+ hours per day of interdisciplinary therapy in a comprehensive inpatient rehab setting. Physiatrist is providing close team supervision and 24 hour management of active medical problems listed below. Physiatrist and rehab team continue to assess barriers to discharge/monitor patient progress toward functional and medical goals  Care Tool:  Bathing    Body parts bathed by patient: Left arm, Chest, Abdomen, Front perineal area, Buttocks, Right upper leg, Left upper leg, Right lower leg, Left lower leg, Face   Body parts bathed by helper: Right arm Body parts n/a: Right arm   Bathing assist Assist Level: Minimal Assistance - Patient > 75%     Upper Body Dressing/Undressing Upper body dressing   What is the patient wearing?: Pull over shirt    Upper body assist Assist Level: Supervision/Verbal cueing    Lower Body Dressing/Undressing Lower body dressing      What is the patient wearing?: Pants, Underwear/pull up     Lower body assist Assist  for lower body dressing: Minimal Assistance - Patient > 75%     Toileting Toileting    Toileting assist Assist for toileting: Minimal Assistance - Patient > 75%     Transfers Chair/bed transfer  Transfers assist     Chair/bed transfer assist level: Supervision/Verbal cueing     Locomotion Ambulation   Ambulation assist      Assist level: Minimal Assistance - Patient > 75% Assistive device: Walker-rolling Max distance: 170   Walk 10 feet activity   Assist     Assist level: Minimal Assistance - Patient > 75% Assistive device: Walker-rolling   Walk 50 feet activity   Assist Walk 50 feet with 2 turns activity did not occur: Safety/medical concerns (L hemiplegia and pt fatigue)  Assist level: Minimal Assistance - Patient > 75% Assistive device: Walker-rolling    Walk 150 feet activity   Assist Walk 150 feet activity did not  occur: Safety/medical concerns (L hemiplegia and pt fatigue)  Assist level: Minimal Assistance - Patient > 75% Assistive device: Walker-hemi    Walk 10 feet on uneven surface  activity   Assist Walk 10 feet on uneven surfaces activity did not occur: Safety/medical concerns         Wheelchair     Assist Is the patient using a wheelchair?: Yes Type of Wheelchair: Manual    Wheelchair assist level: Supervision/Verbal cueing Max wheelchair distance: 300    Wheelchair 50 feet with 2 turns activity    Assist        Assist Level: Supervision/Verbal cueing   Wheelchair 150 feet activity     Assist      Assist Level: Supervision/Verbal cueing   Blood pressure (!) 142/83, pulse 99, temperature 98.3 F (36.8 C), temperature source Oral, resp. rate 17, height 5' 6 (1.676 m), weight 72.3 kg, SpO2 99%.   Medical Problem List and Plan: 1. 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 Search for facility ongoing. May only be able to go to custodial care facility  D/c SNF- hopefully in AM  D/C today  Have found location  of SNF< but awaiting insurance approval- pt agreed.   2. DVT/anticoagulation:  Pharmaceutical: Lovenox              -antiplatelet therapy: continue Aspirin  81 mg daily and Plavix  75 mg daily x 3 weeks then Plavix  alone  3. Pain Management: continue Tylenol  as needed  4/3- having new burning pain in LUE- which is seen sometimes with stroke- will add Duloxetine  30 mg daily for nerve pain- monitor for side effects.  -if gets nausea, will change to Lyrica  4/4-6 no side effects so far-  4/8- will increase Duloxetine  to 60 mg daily  DC hydrocodone  due to side effects, trial tyl #3  as needed  for pain that is not relieved by Tylenol . 4/28- advised pt to use voltaren  gel  on all sides of L hand including between fingers- use tylenol  #3 as needed 5/1- pt said using voltaren  gel and tylenol  #3 as needed- adding lidoderm  patches 2 patches at night for back pain 5/2- HA's- will restart SCHEDULED Topamax  75 mg at bedtime- not sure what occurred? Needs to get  nightly 5/11- Add Lyrica  50 mg BID for nerve pain in L hand 5/13- changed to q6pm since pt feels it affected her vision-made it worse 5/14- said pain better today- not clear if due to prednisone  vs Lyrica ? 5/15 pain much better in L hand-  pt feels it's gout? It might be 5/16- hand pain slightly better with ACE wrap overnight- which is fine 5/20- pt reports having migraines every AM, but refusing topamax  last few days- at least 3- will go back and d/w pt and NOT make changes as we discussed 5/21- will reduce Topamax  to 25 mg at bedtime to help with feelinga little confused 5/22- Will stop Topamax  since made her so loopy- pt doesn't want any other meds to try to help HA's- said will take Tylenol  5/26- Still having migraines intermittently 5/27- L hand and wrist pain- radiates to L shoulder- no deformity- but holds hand in vulcan gesture at rest- xrays pending 5/29- 5/30- hand swelling and pain almost back to baseline 5-31: Last use of as needed Tylenol  with codeine  5-29, patient wishes to keep on board.  Defer to primary team 6/10- will con't tylenol  #3 as needed- doesn't use frequently- HA's doing better 4. Depression: continue duloxetine         5. Neuropsych/cognition: This patient is capable of making decisions on her own behalf. 6. Skin/Wound Care: Routine skin checks 7. Fluids/Electrolytes/Nutrition:encouraging po.   5/12- I personally reviewed the patient's labs today.     -encouraging PO 8.  Dysphagia.  Dysphagia 3 with thins currently  4/18 intake ok 5/11-5/13- advised pt to take smaller bites-  intake good, but takes too  big of bites    5/14- switched back to full supervision- pt unhappy, but wasn't listening when I discussed more of how to eat/take bites 5/16- taking smaller bites with full supervision 5/18 eating 65 to 95% of her meals today 5/22- Back to full supervision- has to sit completely upright to eat 5/23- if this goes better, can go back to intermittent supervision once SLP decided 5-31: P.o. intakes appear adequate.  9.  Hyperlipidemia.  Continue Crestor  10.  Recent History of cocaine/tobacco use.  UDS positive for cocaine.  Provide counseling, this has been a chronic issue , seen in PCP notes 11.  Hypothyroidism. continue Synthroid   12.  Constipation.   Senokot-S 2 tab twice daily  5/6- LBM yesterday even though chart says 5/3  5/7- will add Miralax - esp since increased Duloxetine  which can cause constipation- already on Senna 2 tabs BID  6/10- LBM yesterday 15. Blurry vision and dysconjugate gaze  3/27- will need Ophtho after d/c.   4/4- pt reports everything blurry- cannot see faces as a result- never had eye checked in past- used readers but cannot see at a distance since stroke- will send to Ophtho after d/c.   4/7- d/w pt again- she wants eyeglasses- explained we cannot get when in hospital- also per guidelines, since vision can still change, to not get for 3 months after stroke. She disbelieves it's from stroke, since had 2 prior- that didn't cause vision changes- we educated pt pn how this can occur.   4/25- reminded pt cannot get Optho in hospital- need to get once d/c'd  4/30 went over visual changes with pt- will need to get glasses, likely prisms 2-3 months after d/c once vision stabilizes  5/6- d/w pt again- she wants to see Optho- explained they do not come into hospital for this  5/18 continued blurry vision, discussed outpatient  f/u  5/23- pt having blurry vision again this AM, she says slightly worse- will con't to monitor  5-31: No complaints of blurry vision  6/11- will ask  for f/u with Optho 16.  Left hip pain.  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 Severe HA-daily HA's  4/15-16 sx appear improved with topamax , tylenol  helps also   -may use sx to get out of therapy at times?  4/23- Per team, pt having HA's frequently which impairs therapy- she won't discuss HA's with me- unclear if only has a day goes on vs self limting behavior  4/24- increase to 75 mg at bedtime  4/25- still having HA's, actually thinks a little worse since not taking Norco for it-   4/28-4/29 fewer complaints about HA this AM  4/30-5/1- - per staff, c/o these less- not interfering with therapy as much  5/2- Wasn't getting topamax - just taking T#3- wil restart Topamax - don't know what occurred with order  5/6- HA's doing better  5/8- not requiring T#3 anymore lately per pt  5/13- Severe HA 5/11 in afternoon- took T#3  5/17 headache today does not sound severe, she has not taken her Tylenol  3 yet but nursing is getting it for her  5/18 headache improved with Tylenol  No. 3 today  5/20- refusing topamax - for past 3+ days- will go back and d/w pt  6/5- stopped Topamax - due ot side effects Per LPN HA pain is improving  18. Decreased safety awareness/impulsiveness  4/30- still requiring max cues  5/7- 5/10- no change-per therapy- still requiring max cues  5/23- Still requiring mod-max cues for safety  20. Spasticity  4/22- off baclofen , and c/o LUE pain- wondering how much of her complaints are due to tone?-   4/30- Spasticity improved in LUE- MAS of 0 actually this AM  5/11-12-  Getting some spasticity vs disuse tightness inL hand- fingers esp 3rd/4th DIPs- might need Botox after d/c  5/14- MAS of 1 to 1+ in L wrist and fingers- not proximally  5/27- Spasticity slightly worse this AM- esp when yawns- explained that's normal with spasticity due to increased pain Left upper extremity left lower extremity spasms yesterday has been off baclofen  for >17mo , tried robaxin  received 2 doses since yesterday which was helpful. This sounds like spasticity however other than clonus at the left ankle tone looks okay today.  Seemed to respond to Robaxin  but baclofen  may be more appropriate in this situation.  At this point the problem appears to be self-limited and may not need this medication on a long-term basis  6/9- I agree with above- hopefully we don't need to start a spasticity agent  6/11- pt reports robaxin  helpful for muscle spasms, however makes her sedated- explained that all muscle relaxants can cause sedation 21. 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 .   4/30- pt pulled scab off-  22.  Urinary  and bowel incont due  to CVA, continue toileting program 4/23- still cannot control B/B 4/30- on toileting program- is getting somewhat better, but still incontinent more at night  5/13- still incontinent at night  5/18 continued incontinence   23. 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   4/29- will get Dopplers, because pt says she elevated hand overnight- still swollen- ordered  4/30- Dopplers (-)- will get isotoner compression glove  5/4 received glove  5/6- Swelling ~ 50% better- will  increase Duloxetine  to 120 mg daily for nerve pain in L hand and mood.   5/7- hand sore this AM- but not killing her.   5/8- pain great this AM- doesn't need meds other than tylenol  per pt -sounds like increased cymbalta  has helped  5/9- Some pain in L hand this Am, but hasn't taken tylenol  or T#3 yet this Am- 5/13- Will decrease Lyrica  to 50 mg q6pm since caused her to have visual changes early in day  5/14- Pain doing better in last 12+ hours- not as painful  24. C/O catch in breath- no SOB  5/9- asked pt to use ICS every 1 hour while awake- since probably is atelectasis   25. Dry eyes continue  eye drops as needed for pt  26. Gout? Swelling of L hand  5/13- pt has never had gout but runs in her family- she wants to try something else ot see if would help L hand- will try Colchicine  0.6 mg daily x 4 days as well as Prednisone  20 mg daily x 4 days-  5/14- pt feels pain and swelling are better- not sure if due to gout, or just prednisone  vs power of suggestion- will con't to monitor  5/15- pt feels swelling and pain is better- maybe it is gout? Vs overall improvement with prednisone   27. Vaginal candidiasis  5/15- pt reports Sx's- will give Diflucan  150 mg x1   5/22- no more complaints  28. Worsening urinary incontinence/frequency at night  Discussed that UA and UC are negative  - 5-31, 6/1: Mostly continent except at nighttime.  Monitor. 29. Sedation with Melatonin  6/5- will reduce to 3 mg at bedtime and change to 8pm  6/1- sleeping better with lower dose 30. Pt reports only going to stay at SNF for 2 weeks to work on gait and transfers   The patient is medically ready for discharge to SNF and will need follow-up with [facility] PM&R. In addition, they will need to follow up with their PCP, Ophthalmology, Neurology. Need both in [facility], per SNF requirements      LOS: 79 days A FACE TO FACE EVALUATION WAS PERFORMED  Gracie Gupta 04/28/2024, 7:49 AM

## 2024-04-29 DIAGNOSIS — E559 Vitamin D deficiency, unspecified: Secondary | ICD-10-CM | POA: Diagnosis not present

## 2024-04-29 DIAGNOSIS — Z7189 Other specified counseling: Secondary | ICD-10-CM | POA: Diagnosis not present

## 2024-04-29 DIAGNOSIS — I69354 Hemiplegia and hemiparesis following cerebral infarction affecting left non-dominant side: Secondary | ICD-10-CM | POA: Diagnosis not present

## 2024-04-29 DIAGNOSIS — E7800 Pure hypercholesterolemia, unspecified: Secondary | ICD-10-CM | POA: Diagnosis not present

## 2024-04-29 DIAGNOSIS — F3341 Major depressive disorder, recurrent, in partial remission: Secondary | ICD-10-CM | POA: Diagnosis not present

## 2024-04-29 DIAGNOSIS — Z7902 Long term (current) use of antithrombotics/antiplatelets: Secondary | ICD-10-CM | POA: Diagnosis not present

## 2024-04-29 DIAGNOSIS — Z85850 Personal history of malignant neoplasm of thyroid: Secondary | ICD-10-CM | POA: Diagnosis not present

## 2024-04-29 DIAGNOSIS — E039 Hypothyroidism, unspecified: Secondary | ICD-10-CM | POA: Diagnosis not present

## 2024-05-05 DIAGNOSIS — M533 Sacrococcygeal disorders, not elsewhere classified: Secondary | ICD-10-CM | POA: Diagnosis not present

## 2024-05-05 DIAGNOSIS — M545 Low back pain, unspecified: Secondary | ICD-10-CM | POA: Diagnosis not present

## 2024-05-16 DIAGNOSIS — M25559 Pain in unspecified hip: Secondary | ICD-10-CM | POA: Diagnosis not present

## 2024-06-01 DIAGNOSIS — E7800 Pure hypercholesterolemia, unspecified: Secondary | ICD-10-CM | POA: Diagnosis not present

## 2024-06-01 DIAGNOSIS — I10 Essential (primary) hypertension: Secondary | ICD-10-CM | POA: Diagnosis not present

## 2024-06-04 ENCOUNTER — Encounter: Attending: Registered Nurse | Admitting: Registered Nurse

## 2024-06-15 DIAGNOSIS — I10 Essential (primary) hypertension: Secondary | ICD-10-CM | POA: Diagnosis not present

## 2024-06-25 ENCOUNTER — Telehealth: Payer: Self-pay

## 2024-06-25 DIAGNOSIS — I10 Essential (primary) hypertension: Secondary | ICD-10-CM

## 2024-06-25 DIAGNOSIS — E785 Hyperlipidemia, unspecified: Secondary | ICD-10-CM

## 2024-06-25 NOTE — Progress Notes (Signed)
 Complex Care Management Note  Care Guide Note 06/25/2024 Name: Theresa Watkins MRN: [id number] DOB: [birth year]  Theresa Watkins is a 58 y.o. year old female who sees Vicci Barnie NOVAK, MD for primary care. I reached out to Theresa Watkins by phone today to offer complex care management services.  Ms. Knoff was given information about Complex Care Management services today including:   The Complex Care Management services include support from the care team which includes your Nurse Care Manager, Clinical Social Worker, or Pharmacist.  The Complex Care Management team is here to help remove barriers to the health concerns and goals most important to you. Complex Care Management services are voluntary, and the patient may decline or stop services at any time by request to their care team member.   Complex Care Management Consent Status: Patient did not agree to participate in complex care management services at this time.  Follow up plan:    Encounter Outcome:  Patient Refused  Jeoffrey Buffalo , RMA     [facility]  [facility] Guide  Direct Dial: [phone]  Website: [email or web address]

## 2024-06-29 DIAGNOSIS — I10 Essential (primary) hypertension: Secondary | ICD-10-CM | POA: Diagnosis not present

## 2024-06-29 DIAGNOSIS — Z8673 Personal history of transient ischemic attack (TIA), and cerebral infarction without residual deficits: Secondary | ICD-10-CM | POA: Diagnosis not present

## 2024-06-29 DIAGNOSIS — M160 Bilateral primary osteoarthritis of hip: Secondary | ICD-10-CM | POA: Diagnosis not present

## 2024-07-10 DIAGNOSIS — I69354 Hemiplegia and hemiparesis following cerebral infarction affecting left non-dominant side: Secondary | ICD-10-CM | POA: Diagnosis not present

## 2024-07-10 DIAGNOSIS — R9431 Abnormal electrocardiogram [ECG] [EKG]: Secondary | ICD-10-CM | POA: Diagnosis not present

## 2024-07-10 DIAGNOSIS — R918 Other nonspecific abnormal finding of lung field: Secondary | ICD-10-CM | POA: Diagnosis not present

## 2024-07-10 DIAGNOSIS — R0902 Hypoxemia: Secondary | ICD-10-CM | POA: Diagnosis not present

## 2024-07-10 DIAGNOSIS — D509 Iron deficiency anemia, unspecified: Secondary | ICD-10-CM | POA: Diagnosis not present

## 2024-07-10 DIAGNOSIS — G4733 Obstructive sleep apnea (adult) (pediatric): Secondary | ICD-10-CM | POA: Diagnosis not present

## 2024-07-10 DIAGNOSIS — R0602 Shortness of breath: Secondary | ICD-10-CM | POA: Diagnosis not present

## 2024-07-10 DIAGNOSIS — R000 Tachycardia, unspecified: Secondary | ICD-10-CM | POA: Diagnosis not present

## 2024-07-10 DIAGNOSIS — R002 Palpitations: Secondary | ICD-10-CM | POA: Diagnosis not present

## 2024-07-10 DIAGNOSIS — Z79890 Hormone replacement therapy (postmenopausal): Secondary | ICD-10-CM | POA: Diagnosis not present

## 2024-07-10 DIAGNOSIS — Z85850 Personal history of malignant neoplasm of thyroid: Secondary | ICD-10-CM | POA: Diagnosis not present

## 2024-07-10 DIAGNOSIS — R509 Fever, unspecified: Secondary | ICD-10-CM | POA: Diagnosis not present

## 2024-07-10 DIAGNOSIS — E785 Hyperlipidemia, unspecified: Secondary | ICD-10-CM | POA: Diagnosis not present

## 2024-07-10 DIAGNOSIS — R0989 Other specified symptoms and signs involving the circulatory and respiratory systems: Secondary | ICD-10-CM | POA: Diagnosis not present

## 2024-07-10 DIAGNOSIS — J439 Emphysema, unspecified: Secondary | ICD-10-CM | POA: Diagnosis not present

## 2024-07-10 DIAGNOSIS — R410 Disorientation, unspecified: Secondary | ICD-10-CM | POA: Diagnosis not present

## 2024-07-10 DIAGNOSIS — I10 Essential (primary) hypertension: Secondary | ICD-10-CM | POA: Diagnosis not present

## 2024-07-10 DIAGNOSIS — D751 Secondary polycythemia: Secondary | ICD-10-CM | POA: Diagnosis not present

## 2024-07-10 DIAGNOSIS — I471 Supraventricular tachycardia, unspecified: Secondary | ICD-10-CM | POA: Diagnosis not present

## 2024-07-10 DIAGNOSIS — E890 Postprocedural hypothyroidism: Secondary | ICD-10-CM | POA: Diagnosis not present

## 2024-07-10 DIAGNOSIS — F17210 Nicotine dependence, cigarettes, uncomplicated: Secondary | ICD-10-CM | POA: Diagnosis not present

## 2024-07-10 DIAGNOSIS — K219 Gastro-esophageal reflux disease without esophagitis: Secondary | ICD-10-CM | POA: Diagnosis not present

## 2024-07-10 DIAGNOSIS — I69322 Dysarthria following cerebral infarction: Secondary | ICD-10-CM | POA: Diagnosis not present

## 2024-07-11 DIAGNOSIS — I499 Cardiac arrhythmia, unspecified: Secondary | ICD-10-CM | POA: Diagnosis not present

## 2024-07-11 DIAGNOSIS — I471 Supraventricular tachycardia, unspecified: Secondary | ICD-10-CM | POA: Diagnosis not present

## 2024-07-12 DIAGNOSIS — I471 Supraventricular tachycardia, unspecified: Secondary | ICD-10-CM | POA: Diagnosis not present

## 2024-07-13 DIAGNOSIS — I471 Supraventricular tachycardia, unspecified: Secondary | ICD-10-CM | POA: Diagnosis not present

## 2024-07-13 DIAGNOSIS — I519 Heart disease, unspecified: Secondary | ICD-10-CM | POA: Diagnosis not present

## 2024-07-14 DIAGNOSIS — F32A Depression, unspecified: Secondary | ICD-10-CM | POA: Diagnosis not present

## 2024-07-14 DIAGNOSIS — I69391 Dysphagia following cerebral infarction: Secondary | ICD-10-CM | POA: Diagnosis not present

## 2024-07-14 DIAGNOSIS — M62838 Other muscle spasm: Secondary | ICD-10-CM | POA: Diagnosis not present

## 2024-07-14 DIAGNOSIS — G4734 Idiopathic sleep related nonobstructive alveolar hypoventilation: Secondary | ICD-10-CM | POA: Diagnosis not present

## 2024-07-14 DIAGNOSIS — G4700 Insomnia, unspecified: Secondary | ICD-10-CM | POA: Diagnosis not present

## 2024-07-14 DIAGNOSIS — E785 Hyperlipidemia, unspecified: Secondary | ICD-10-CM | POA: Diagnosis not present

## 2024-07-14 DIAGNOSIS — D45 Polycythemia vera: Secondary | ICD-10-CM | POA: Diagnosis not present

## 2024-07-14 DIAGNOSIS — J449 Chronic obstructive pulmonary disease, unspecified: Secondary | ICD-10-CM | POA: Diagnosis not present

## 2024-07-14 DIAGNOSIS — Z85850 Personal history of malignant neoplasm of thyroid: Secondary | ICD-10-CM | POA: Diagnosis not present

## 2024-07-14 DIAGNOSIS — J439 Emphysema, unspecified: Secondary | ICD-10-CM | POA: Diagnosis not present

## 2024-07-14 DIAGNOSIS — M19011 Primary osteoarthritis, right shoulder: Secondary | ICD-10-CM | POA: Diagnosis not present

## 2024-07-14 DIAGNOSIS — I69320 Aphasia following cerebral infarction: Secondary | ICD-10-CM | POA: Diagnosis not present

## 2024-07-14 DIAGNOSIS — M1612 Unilateral primary osteoarthritis, left hip: Secondary | ICD-10-CM | POA: Diagnosis not present

## 2024-07-14 DIAGNOSIS — E039 Hypothyroidism, unspecified: Secondary | ICD-10-CM | POA: Diagnosis not present

## 2024-07-14 DIAGNOSIS — I10 Essential (primary) hypertension: Secondary | ICD-10-CM | POA: Diagnosis not present

## 2024-07-14 DIAGNOSIS — Z87891 Personal history of nicotine dependence: Secondary | ICD-10-CM | POA: Diagnosis not present

## 2024-07-14 DIAGNOSIS — I69354 Hemiplegia and hemiparesis following cerebral infarction affecting left non-dominant side: Secondary | ICD-10-CM | POA: Diagnosis not present

## 2024-07-14 DIAGNOSIS — I69312 Visuospatial deficit and spatial neglect following cerebral infarction: Secondary | ICD-10-CM | POA: Diagnosis not present

## 2024-07-14 DIAGNOSIS — I471 Supraventricular tachycardia, unspecified: Secondary | ICD-10-CM | POA: Diagnosis not present

## 2024-07-15 DIAGNOSIS — J439 Emphysema, unspecified: Secondary | ICD-10-CM | POA: Diagnosis not present

## 2024-07-15 DIAGNOSIS — Z8679 Personal history of other diseases of the circulatory system: Secondary | ICD-10-CM | POA: Diagnosis not present

## 2024-07-15 DIAGNOSIS — J4489 Other specified chronic obstructive pulmonary disease: Secondary | ICD-10-CM | POA: Diagnosis not present

## 2024-07-15 DIAGNOSIS — R9431 Abnormal electrocardiogram [ECG] [EKG]: Secondary | ICD-10-CM | POA: Diagnosis not present

## 2024-07-15 DIAGNOSIS — I10 Essential (primary) hypertension: Secondary | ICD-10-CM | POA: Diagnosis not present

## 2024-07-15 DIAGNOSIS — E039 Hypothyroidism, unspecified: Secondary | ICD-10-CM | POA: Diagnosis not present

## 2024-07-15 DIAGNOSIS — D45 Polycythemia vera: Secondary | ICD-10-CM | POA: Diagnosis not present

## 2024-07-15 DIAGNOSIS — G4734 Idiopathic sleep related nonobstructive alveolar hypoventilation: Secondary | ICD-10-CM | POA: Diagnosis not present

## 2024-07-20 DIAGNOSIS — R2689 Other abnormalities of gait and mobility: Secondary | ICD-10-CM | POA: Diagnosis not present

## 2024-07-20 DIAGNOSIS — Z8673 Personal history of transient ischemic attack (TIA), and cerebral infarction without residual deficits: Secondary | ICD-10-CM | POA: Diagnosis not present

## 2024-07-21 DIAGNOSIS — M79602 Pain in left arm: Secondary | ICD-10-CM | POA: Diagnosis not present

## 2024-07-21 DIAGNOSIS — R102 Pelvic and perineal pain: Secondary | ICD-10-CM | POA: Diagnosis not present

## 2024-07-21 DIAGNOSIS — M25552 Pain in left hip: Secondary | ICD-10-CM | POA: Diagnosis not present

## 2024-07-21 DIAGNOSIS — M25562 Pain in left knee: Secondary | ICD-10-CM | POA: Diagnosis not present

## 2024-07-21 DIAGNOSIS — M79652 Pain in left thigh: Secondary | ICD-10-CM | POA: Diagnosis not present

## 2024-07-27 ENCOUNTER — Telehealth: Payer: Self-pay | Admitting: Internal Medicine

## 2024-07-27 NOTE — Telephone Encounter (Signed)
 Copied from CRM [id number]. Topic: General - Other >> Jul 27, 2024  2:22 PM Zebedee SAUNDERS wrote:  Reason for CRM: Received call from [employer] per [facility] ph: [phone] [phone] regarding fax sent on 07/12/2024 for incontinent supplies.

## 2024-07-28 NOTE — Telephone Encounter (Signed)
 I would need to see pt. Over due for hospital f/u with me anyway. I thinks she is currently in a SNF at this time post most recent hosp discharge.

## 2024-08-02 NOTE — Telephone Encounter (Signed)
 I tried again to reach Madelin Kohler, RN : [phone] and had to leave a message requesting a call back. I want to explain what Davetta needs and provide her with Davetta's phone number.

## 2024-08-03 NOTE — Telephone Encounter (Signed)
 I spoke to Tammy,Blakley [phone] and explained patient's mother's request for placement at [facility].  Tammy said she would reach out to patient's mother and gather more information.

## 2024-08-04 DIAGNOSIS — I10 Essential (primary) hypertension: Secondary | ICD-10-CM | POA: Diagnosis not present

## 2024-08-04 DIAGNOSIS — E039 Hypothyroidism, unspecified: Secondary | ICD-10-CM | POA: Diagnosis not present

## 2024-08-10 NOTE — Telephone Encounter (Signed)
 I spoke to Madelin Kohler, RN/ [facility] and she confirmed that she has been in contact with the patient's mother, Davetta, and they are currently just waiting on a LTC bed at [facility].  She said that the patient's mother toured the facility and was very satisfied.   I then called patient's mother, Davetta, and she said that she toured [facility] and really liked it and they are just waiting for a LTC bed to become available.  She  is hoping that bed will be available this week or next week because she understands her daughter's 100 days at the current facility is coming to an end. She was very pleased that Tammy was able to help her with this transition to have her daughter in [place]. I told her to please call me back if she has any questions and she said she would

## 2024-08-19 NOTE — Telephone Encounter (Signed)
 Call received from [facility], RN stating that they were hoping for a LTC bed at [facility] for the patient this week but right now they do not have one.  She stated that hopefully they will have one next week.  Tammy said that Raul/ [facility] is supposed to be keeping patient's mother up to date regarding bed availability.  She said Donnamarie will be reaching out to her mother now, to provide an update.

## 2024-08-19 NOTE — Telephone Encounter (Signed)
 Call received from patient's mother, Davetta, who said she wants to contact Madelin Kohler, RN/[facility] but she misplaced her phone number.  Davetta said that nothing has changed re.her daughter's status and she is still in the facility in [place]. She said she was told that there were supposed to be 5 discharges from [facility] this week and she was under the impression that her daughter would be accepted for one of those beds.  I provided her with Tammy's number and I told her that I would also call Tammy to check on the bed status.   Davetta then asked if her daughter has any upcoming appointments and I told her there are 3 at [facility] in [place] that I can see: 10/13, 10/28 and 11/7.  Davetta was not sure if the facility is aware of those appointments, so she will inform them and also ask them about who provides transportation for her daughter to the appointments.     I then called Madelin Kohler, RN and had to leave a message requesting a call back.

## 2024-09-10 ENCOUNTER — Telehealth: Payer: Self-pay | Admitting: Internal Medicine

## 2024-09-10 NOTE — Telephone Encounter (Signed)
 Copied from CRM #[id number]. Topic: Appointments - Scheduling Inquiry for Clinic >> Sep 10, 2024  9:18 AM Rosaria BRAVO wrote:  Reason for CRM: Pt's mother Theresa Watkins called requesting to speak to Slater Diesel. Please advise   Best contact: [phone]  Concerning appts  Pt is leaving [place] and is coming to [place]. Needs a transfer of appt dates.

## 2024-09-10 NOTE — Telephone Encounter (Signed)
 Call to  patient mother . VM left

## 2024-09-14 NOTE — Telephone Encounter (Signed)
 I called patient's mother, Theresa Watkins back at the number provided: [phone] and a person answered who was non- English speaking.  I then called the number on file for Theresa Watkins: [phone] and left a message requesting a call back.

## 2024-09-22 NOTE — Telephone Encounter (Signed)
 I spoke to patient's mother, Davetta and she confirmed that her daughter is now residing at [facility] in [place].  She stated she is very pleased with the facility and was appreciative of the assistance she received transferring her mother there from [place].  She said that the staff from [facility] will be bringing her daughter to the appointment with Dr Vicci next week- 09/27/2024.

## 2024-09-27 ENCOUNTER — Ambulatory Visit: Admitting: Internal Medicine

## 2024-09-27 ENCOUNTER — Telehealth: Payer: Self-pay | Admitting: Internal Medicine

## 2024-09-27 ENCOUNTER — Other Ambulatory Visit: Payer: Self-pay | Admitting: Internal Medicine

## 2024-09-27 NOTE — Telephone Encounter (Signed)
 I returned the call to patient's mother, Davetta.  She explained that she had to cancel her daughter's appointment with Dr Vicci today because her daughter was diagnosed with COVID and pneumonia last Thurs, 09/23/2024. She said her daughter said she may need to go to the hospital because of her breathing.  Davetta wanted to know if we heard anything about her daughter going to the hospital.  I told her that we have not heard anything and I do not see that she is at the hospital now.

## 2024-09-27 NOTE — Telephone Encounter (Signed)
 Copied from CRM [id number]. Topic: Appointments - Scheduling Inquiry for Clinic >> Sep 27, 2024  1:07 PM Gustabo D wrote: Pt will not make appt today due to being diagnose with pneumonia and may be going to [facility] today.  Please call Davetta back nurse Slater Call back [phone]

## 2024-11-17 ENCOUNTER — Telehealth: Payer: Self-pay | Admitting: Internal Medicine

## 2024-11-17 NOTE — Telephone Encounter (Signed)
 Copied from CRM [id number]. Topic: General - Other >> Nov 17, 2024 11:35 AM Wess RAMAN wrote:  Reason for CRM: Patient's mother, FRANSISCA SHAWN, would like to speak with Slater to discuss issues with her daughter.  Callback #: [phone]

## 2024-11-19 NOTE — Telephone Encounter (Signed)
 Spoke with Theresa Watkins. Theresa had called earlier to speak with Slater. I informed her that Slater is currently out of the office. Theresa stated that the matter she wanted to discuss with Slater has already been resolved. She requested contact information for [facility]. Theresa confirmed that she spoke with Tammy and all concerns regarding her mother have been addressed. She expressed appreciation for the follow-up call and asked that Slater be informed she will reach out at a later date.

## 2024-11-23 NOTE — Telephone Encounter (Signed)
 Call returned to patient's mother, Davetta, and I had to leave a message requesting a call back.
